# Patient Record
Sex: Male | Born: 1960 | Race: White | Hispanic: No | Marital: Married | Smoking: Former smoker
Health system: Southern US, Community
[De-identification: ages and names within clinical notes are randomized; demographics above are authoritative.]

## PROBLEM LIST (undated history)

## (undated) DIAGNOSIS — R011 Cardiac murmur, unspecified: Secondary | ICD-10-CM

## (undated) DIAGNOSIS — Z87440 Personal history of urinary (tract) infections: Secondary | ICD-10-CM

## (undated) DIAGNOSIS — I6529 Occlusion and stenosis of unspecified carotid artery: Secondary | ICD-10-CM

## (undated) DIAGNOSIS — K219 Gastro-esophageal reflux disease without esophagitis: Secondary | ICD-10-CM

## (undated) DIAGNOSIS — F10931 Alcohol use, unspecified with withdrawal delirium: Secondary | ICD-10-CM

## (undated) DIAGNOSIS — Z9981 Dependence on supplemental oxygen: Secondary | ICD-10-CM

## (undated) DIAGNOSIS — R4020 Unspecified coma: Secondary | ICD-10-CM

## (undated) DIAGNOSIS — E785 Hyperlipidemia, unspecified: Secondary | ICD-10-CM

## (undated) DIAGNOSIS — C679 Malignant neoplasm of bladder, unspecified: Secondary | ICD-10-CM

## (undated) DIAGNOSIS — I10 Essential (primary) hypertension: Secondary | ICD-10-CM

## (undated) DIAGNOSIS — F32A Depression, unspecified: Secondary | ICD-10-CM

## (undated) DIAGNOSIS — X58XXXA Exposure to other specified factors, initial encounter: Secondary | ICD-10-CM

## (undated) DIAGNOSIS — F1010 Alcohol abuse, uncomplicated: Secondary | ICD-10-CM

## (undated) DIAGNOSIS — Z973 Presence of spectacles and contact lenses: Secondary | ICD-10-CM

## (undated) DIAGNOSIS — J449 Chronic obstructive pulmonary disease, unspecified: Secondary | ICD-10-CM

## (undated) DIAGNOSIS — K7460 Unspecified cirrhosis of liver: Secondary | ICD-10-CM

## (undated) DIAGNOSIS — R0602 Shortness of breath: Secondary | ICD-10-CM

## (undated) DIAGNOSIS — F10231 Alcohol dependence with withdrawal delirium: Secondary | ICD-10-CM

## (undated) DIAGNOSIS — C801 Malignant (primary) neoplasm, unspecified: Secondary | ICD-10-CM

## (undated) DIAGNOSIS — IMO0002 Reserved for concepts with insufficient information to code with codable children: Secondary | ICD-10-CM

## (undated) DIAGNOSIS — R1031 Right lower quadrant pain: Secondary | ICD-10-CM

## (undated) DIAGNOSIS — G629 Polyneuropathy, unspecified: Secondary | ICD-10-CM

## (undated) DIAGNOSIS — K449 Diaphragmatic hernia without obstruction or gangrene: Secondary | ICD-10-CM

## (undated) DIAGNOSIS — M25519 Pain in unspecified shoulder: Secondary | ICD-10-CM

## (undated) DIAGNOSIS — G4730 Sleep apnea, unspecified: Secondary | ICD-10-CM

## (undated) DIAGNOSIS — K4090 Unilateral inguinal hernia, without obstruction or gangrene, not specified as recurrent: Secondary | ICD-10-CM

## (undated) DIAGNOSIS — M25539 Pain in unspecified wrist: Secondary | ICD-10-CM

## (undated) DIAGNOSIS — K5730 Diverticulosis of large intestine without perforation or abscess without bleeding: Secondary | ICD-10-CM

## (undated) DIAGNOSIS — G4733 Obstructive sleep apnea (adult) (pediatric): Secondary | ICD-10-CM

## (undated) DIAGNOSIS — G43909 Migraine, unspecified, not intractable, without status migrainosus: Secondary | ICD-10-CM

## (undated) DIAGNOSIS — H269 Unspecified cataract: Secondary | ICD-10-CM

## (undated) DIAGNOSIS — J302 Other seasonal allergic rhinitis: Secondary | ICD-10-CM

## (undated) DIAGNOSIS — J9801 Acute bronchospasm: Secondary | ICD-10-CM

## (undated) DIAGNOSIS — K644 Residual hemorrhoidal skin tags: Secondary | ICD-10-CM

## (undated) DIAGNOSIS — R198 Other specified symptoms and signs involving the digestive system and abdomen: Secondary | ICD-10-CM

## (undated) DIAGNOSIS — F419 Anxiety disorder, unspecified: Secondary | ICD-10-CM

## (undated) DIAGNOSIS — T7840XA Allergy, unspecified, initial encounter: Secondary | ICD-10-CM

## (undated) DIAGNOSIS — B019 Varicella without complication: Secondary | ICD-10-CM

## (undated) DIAGNOSIS — M549 Dorsalgia, unspecified: Secondary | ICD-10-CM

## (undated) HISTORY — PX: CAROTID STENT: SHX1301

## (undated) HISTORY — PX: COLONOSCOPY: WVUENDOPRO10

## (undated) HISTORY — DX: Occlusion and stenosis of unspecified carotid artery: I65.29

## (undated) HISTORY — PX: ESOPHAGOGASTRODUODENOSCOPY: SHX1529

## (undated) HISTORY — PX: OTHER SURGICAL HISTORY: SHX170

## (undated) HISTORY — DX: Malignant neoplasm of bladder, unspecified: C67.9

## (undated) HISTORY — DX: Depression, unspecified: F32.A

## (undated) HISTORY — PX: HX HEART CATHETERIZATION: SHX148

## (undated) HISTORY — PX: HX TONSILLECTOMY: SHX27

## (undated) HISTORY — DX: Obstructive sleep apnea (adult) (pediatric): G47.33

## (undated) HISTORY — PX: UPPER GASTROINTESTINAL ENDOSCOPY: SHX188

## (undated) HISTORY — DX: Unspecified cataract: H26.9

## (undated) HISTORY — PX: WISDOM TOOTH EXTRACTION: SHX21

## (undated) HISTORY — DX: Anxiety disorder, unspecified: F41.9

## (undated) HISTORY — PX: COLONOSCOPY: SHX174

## (undated) HISTORY — DX: Diaphragmatic hernia without obstruction or gangrene: K44.9

## (undated) HISTORY — DX: Pain in unspecified wrist: M25.539

## (undated) HISTORY — DX: Diverticulosis of large intestine without perforation or abscess without bleeding: K57.30

## (undated) HISTORY — DX: Hyperlipidemia, unspecified: E78.5

## (undated) HISTORY — DX: Allergy, unspecified, initial encounter: T78.40XA

## (undated) HISTORY — DX: Right lower quadrant pain: R10.31

## (undated) HISTORY — DX: Sleep apnea, unspecified: G47.30

## (undated) HISTORY — DX: Migraine, unspecified, not intractable, without status migrainosus: G43.909

## (undated) HISTORY — DX: Varicella without complication: B01.9

## (undated) HISTORY — DX: Other specified symptoms and signs involving the digestive system and abdomen: R19.8

## (undated) HISTORY — DX: Residual hemorrhoidal skin tags: K64.4

## (undated) HISTORY — DX: Other seasonal allergic rhinitis: J30.2

## (undated) HISTORY — DX: Pain in unspecified shoulder: M25.519

## (undated) HISTORY — DX: Reserved for concepts with insufficient information to code with codable children: IMO0002

## (undated) HISTORY — DX: Acute bronchospasm: J98.01

## (undated) HISTORY — DX: Dorsalgia, unspecified: M54.9

## (undated) HISTORY — DX: Unilateral inguinal hernia, without obstruction or gangrene, not specified as recurrent: K40.90

## (undated) HISTORY — DX: Polyneuropathy, unspecified: G62.9

## (undated) HISTORY — DX: Gastro-esophageal reflux disease without esophagitis: K21.9

## (undated) MED FILL — lidocaine 2 % mucosal jelly in applicator: Qty: 6 | Status: AC

---

## 2000-10-05 ENCOUNTER — Other Ambulatory Visit: Admission: RE | Admit: 2000-10-05 | Discharge: 2000-10-05 | Payer: Self-pay | Admitting: Gastroenterology

## 2005-09-14 HISTORY — PX: COLON RESECTION: SHX5231

## 2008-02-18 ENCOUNTER — Ambulatory Visit (HOSPITAL_COMMUNITY): Payer: Self-pay | Admitting: FAMILY PRACTICE

## 2011-02-20 ENCOUNTER — Encounter (INDEPENDENT_AMBULATORY_CARE_PROVIDER_SITE_OTHER): Payer: Self-pay | Admitting: *Deleted

## 2011-02-25 NOTE — Letter (Signed)
Summary: New Patient letter  [facility]  [address] [place], [place] [address]   Phone: [phone]  Fax: [phone]       02/20/2011 MRN: [id number]  GEROGE GILLIAM [address] [place], [place]  [address]  Dear Mr. Sabree,  Welcome to the Gastroenterology Division at [facility].    You are scheduled to see Dr.  Jarold Motto on 03-25-11 at 9:00A.M. on the 3rd floor at [facility], [address]  We ask that you try to arrive at our office 15 minutes prior to your appointment time to allow for check-in.  We would like you to complete the enclosed self-administered evaluation form prior to your visit and bring it with you on the day of your appointment.  We will review it with you.  Also, please bring a complete list of all your medications or, if you prefer, bring the medication bottles and we will list them.  Please bring your insurance card so that we may make a copy of it.  If your insurance requires a referral to see a specialist, please bring your referral form from your primary care physician.  Co-payments are due at the time of your visit and may be paid by cash, check or credit card.     Your office visit will consist of a consult with your physician (includes a physical exam), any laboratory testing he/she may order, scheduling of any necessary diagnostic testing (e.g. x-ray, ultrasound, CT-scan), and scheduling of a procedure (e.g. Endoscopy, Colonoscopy) if required.  Please allow enough time on your schedule to allow for any/all of these possibilities.    If you cannot keep your appointment, please call [phone] to cancel or reschedule prior to your appointment date.  This allows [place] the opportunity to schedule an appointment for another patient in need of care.  If you do not cancel or reschedule by 5 p.m. the business day prior to your appointment date, you will be charged a $50.00 late cancellation/no-show fee.    Thank you for choosing  [facility] for your medical needs.  We appreciate the opportunity to care for you.  Please visit [place] at our website  to learn more about our practice.                     Sincerely,                                                             The Gastroenterology Division

## 2011-03-25 ENCOUNTER — Encounter: Payer: Self-pay | Admitting: Gastroenterology

## 2011-03-25 ENCOUNTER — Ambulatory Visit (INDEPENDENT_AMBULATORY_CARE_PROVIDER_SITE_OTHER): Payer: BLUE CROSS/BLUE SHIELD | Admitting: Gastroenterology

## 2011-03-25 DIAGNOSIS — R198 Other specified symptoms and signs involving the digestive system and abdomen: Secondary | ICD-10-CM

## 2011-03-25 DIAGNOSIS — K219 Gastro-esophageal reflux disease without esophagitis: Secondary | ICD-10-CM

## 2011-03-25 DIAGNOSIS — K599 Functional intestinal disorder, unspecified: Secondary | ICD-10-CM

## 2011-03-25 DIAGNOSIS — K594 Anal spasm: Secondary | ICD-10-CM

## 2011-03-25 MED ORDER — HYOSCYAMINE SULFATE 0.125 MG SL SUBL: 0.1250 mg | SUBLINGUAL_TABLET | SUBLINGUAL | Status: AC | PRN

## 2011-03-25 MED ORDER — PEG-KCL-NACL-NASULF-NA ASC-C 100 G PO SOLR: 1.0000 | Freq: Once | ORAL | Status: AC

## 2011-03-25 MED ORDER — MESALAMINE 1000 MG RE SUPP: 1000.0000 mg | Freq: Every day | RECTAL | Status: AC

## 2011-03-25 NOTE — Progress Notes (Signed)
Addended by: Harlow Mares on: 03/25/2011 09:58 AM   Modules accepted: Orders

## 2011-03-25 NOTE — Progress Notes (Signed)
History of Present Illness:  This is a 50 year old Caucasian male referred by [facility] evaluation of worsening acid reflux despite taking Nexium 40 mg a day. There is intermittent solid food dysphagia. Last endoscopic exam was in 2005. He also has had a past history of recurrent diverticulitis and had a partial sigmoid resection 5-6 years ago at [facility]. He been doing well half of her daughter until the last several months but he has noticed a change in caliber of his stool and also rather marked tenesmus. He denies rectal bleeding, melena, abdominal pain, but does have a lot of abdominal gas and bloating. He denies any hepatobiliary complaints or history of pancreatitis or hepatitis. Review of his labs from his primary care doctor showed normal CBC and metabolic profile. His appetite is good his weight is stable and he denies a specific food intolerances except her fried fatty foods. He did have a flare of hemorrhoids one year ago that responded to hemorrhoidal creams. Cannot find a operative report from [facility].  I have reviewed this patient's present history, medical and surgical past history, allergies and medications.     ROS: The remainder of the 10 point ROS is negative. He denies systemic complaints such as fever, chills, skin rashes, or joint pains. 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 He specifically denies cardiovascular, pulmonary, genitourinary, neurologic, orthopedic or endocrine problems. He also denies any chronic neurologic or psychiatric difficulties. He does have allergic sinusitis.  Past Medical History  Diagnosis Date  . Diverticulosis of colon (without mention of hemorrhage)   . Esophageal reflux   . Hiatal hernia   . Other symptoms involving digestive system   . Pain in joint, shoulder region   . Abdominal pain, right lower quadrant   . Backache, unspecified   . Acute bronchospasm   . External hemorrhoids without  mention of complication     thrombosed  . Inguinal hernia without mention of obstruction or gangrene, unilateral or unspecified, (not specified as recurrent)   . Pain in joint, forearm   . Thoracic or lumbosacral neuritis or radiculitis, unspecified   . Obstructive sleep apnea (adult) (pediatric)    Past Surgical History  Procedure Date  . Colon resection 10/06    sigmoid colon    reports that he has quit smoking. His smoking use included Cigars. He has never used smokeless tobacco. He reports that he drinks about 3.5 - 7 ounces of alcohol per week. He reports that he does not use illicit drugs. family history includes Diabetes in his maternal aunt and Lung cancer in his mother.  There is no history of Colon cancer. No Known Allergies      Physical Exam: General well developed well nourished patient in no acute distress, appearing their stated age Eyes PERRLA, no icterus, fundoscopic exam per opthamologist Skin no lesions noted Neck supple, no adenopathy, no thyroid enlargement, no tenderness Chest clear to percussion and auscultation Heart no significant murmurs, gallops or rubs noted Abdomen no hepatosplenomegaly masses or tenderness, BS normal.  Rectal inspection normal no fissures, or fistulae noted.  No masses or tenderness on digital exam. Stool guaiac negative. Extremities no acute joint lesions, edema, phlebitis or evidence of cellulitis. Neurologic patient oriented x 3, cranial nerves intact, no focal neurologic deficits noted. Psychological mental status normal and normal affect.  Assessment and plan: This patient status post sigmoid resection and may have an element of bacterial overgrowth syndrome. Symptomatology seems more consistent with proctitis  is associated tenesmus and proctalgia fugax. I have scheduled colonoscopy exam, also 1 g suppositories at bedtime with when necessary sublingual Levsin use. He is to continue his Nexium with interdental reflux maneuvers, and we  will schedule endoscopic exam and possible dilatation. At the time of his endoscopy we'll do exams for H. pylori and also small bowel biopsy.Previous esophageal biopsies did not show evidence of Barrett's mucosa.  No diagnosis found.

## 2011-03-25 NOTE — Progress Notes (Signed)
Addended by: Harlow Mares on: 03/25/2011 09:57 AM   Modules accepted: Orders

## 2011-03-25 NOTE — Patient Instructions (Addendum)
We printed your rxs and gave them to you in the office today,  Your procedure has been scheduled for 04/04/2011, please follow the seperate instructions.

## 2011-03-26 ENCOUNTER — Encounter: Payer: Self-pay | Admitting: Gastroenterology

## 2011-04-03 ENCOUNTER — Encounter: Payer: Self-pay | Admitting: Internal Medicine

## 2011-04-04 ENCOUNTER — Encounter: Payer: Self-pay | Admitting: Gastroenterology

## 2011-04-04 ENCOUNTER — Ambulatory Visit (AMBULATORY_SURGERY_CENTER): Payer: BLUE CROSS/BLUE SHIELD | Admitting: Gastroenterology

## 2011-04-04 VITALS — BP 122/79 | HR 81 | Temp 99.0°F | Resp 19 | Ht 65.0 in | Wt 154.0 lb

## 2011-04-04 DIAGNOSIS — Z1211 Encounter for screening for malignant neoplasm of colon: Secondary | ICD-10-CM

## 2011-04-04 DIAGNOSIS — K219 Gastro-esophageal reflux disease without esophagitis: Secondary | ICD-10-CM

## 2011-04-04 DIAGNOSIS — K5730 Diverticulosis of large intestine without perforation or abscess without bleeding: Secondary | ICD-10-CM | POA: Insufficient documentation

## 2011-04-04 MED ORDER — SODIUM CHLORIDE 0.9 % IV SOLN: 500.0000 mL | INTRAVENOUS | Status: DC

## 2011-04-04 NOTE — Patient Instructions (Signed)
Discharged instructions given with verbal understanding. Handout on Diverticulosis given. Resume previous medications. 

## 2011-04-07 ENCOUNTER — Telehealth: Payer: Self-pay

## 2011-04-07 NOTE — Telephone Encounter (Signed)

## 2012-12-15 HISTORY — PX: INGUINAL HERNIA REPAIR: SUR1180

## 2014-04-02 ENCOUNTER — Encounter (HOSPITAL_BASED_OUTPATIENT_CLINIC_OR_DEPARTMENT_OTHER): Payer: Self-pay | Admitting: Emergency Medicine

## 2014-04-02 ENCOUNTER — Emergency Department (HOSPITAL_BASED_OUTPATIENT_CLINIC_OR_DEPARTMENT_OTHER)
Admission: EM | Admit: 2014-04-02 | Discharge: 2014-04-02 | Disposition: A | Discharge status: Home / Self Care | Payer: BC Managed Care – PPO | Attending: Emergency Medicine | Admitting: Emergency Medicine

## 2014-04-02 DIAGNOSIS — T50905A Adverse effect of unspecified drugs, medicaments and biological substances, initial encounter: Secondary | ICD-10-CM

## 2014-04-02 DIAGNOSIS — R110 Nausea: Secondary | ICD-10-CM | POA: Insufficient documentation

## 2014-04-02 DIAGNOSIS — K219 Gastro-esophageal reflux disease without esophagitis: Secondary | ICD-10-CM | POA: Insufficient documentation

## 2014-04-02 DIAGNOSIS — Z79899 Other long term (current) drug therapy: Secondary | ICD-10-CM | POA: Insufficient documentation

## 2014-04-02 DIAGNOSIS — Z87891 Personal history of nicotine dependence: Secondary | ICD-10-CM | POA: Insufficient documentation

## 2014-04-02 DIAGNOSIS — Z8709 Personal history of other diseases of the respiratory system: Secondary | ICD-10-CM | POA: Insufficient documentation

## 2014-04-02 DIAGNOSIS — R0602 Shortness of breath: Secondary | ICD-10-CM | POA: Insufficient documentation

## 2014-04-02 DIAGNOSIS — Z8739 Personal history of other diseases of the musculoskeletal system and connective tissue: Secondary | ICD-10-CM | POA: Insufficient documentation

## 2014-04-02 DIAGNOSIS — R209 Unspecified disturbances of skin sensation: Secondary | ICD-10-CM | POA: Insufficient documentation

## 2014-04-02 DIAGNOSIS — Z8669 Personal history of other diseases of the nervous system and sense organs: Secondary | ICD-10-CM | POA: Insufficient documentation

## 2014-04-02 DIAGNOSIS — F411 Generalized anxiety disorder: Secondary | ICD-10-CM | POA: Insufficient documentation

## 2014-04-02 DIAGNOSIS — T43205A Adverse effect of unspecified antidepressants, initial encounter: Secondary | ICD-10-CM | POA: Insufficient documentation

## 2014-04-02 MED ORDER — LORAZEPAM 1 MG PO TABS
1.0000 mg | ORAL_TABLET | Freq: Once | ORAL | Status: AC
  Administered 2014-04-02: 1 mg via ORAL
  Filled 2014-04-02: qty 1

## 2014-04-02 NOTE — Discharge Instructions (Signed)
Please do not take lexapro today.  Consult with your neurologist tomorrow.  Recheck of blood pressure during next 1-2 weeks.

## 2014-04-02 NOTE — ED Notes (Signed)
Patient felt tingling all over his body, he felt anxious as well. Went away and then happened again about 15 minutes later. Denies any symptoms at this time.

## 2014-04-02 NOTE — ED Notes (Signed)
Pt states he was place on Lexapro for ringing of ears, vertigo, and dizziness.  Pt has taken two doses.  Pt states he has felt weird while taking it.  Today he was at the computer and started to feel anxious, tingling all over and hot.   Pt states the feelings are intermittent and doesn't have it at present.

## 2014-04-02 NOTE — ED Provider Notes (Signed)
CSN: [id number]     Arrival date & time 04/02/14  1520 History  This chart was scribed for Shaune Pollack, MD by Zettie Pho, ED Scribe. This patient was seen in room MH09/MH09 and the patient's care was started at 4:23 PM.    Chief Complaint  Patient presents with  . Anxiety   Patient is a 53 y.o. male presenting with anxiety. The history is provided by the patient. No language interpreter was used.  Anxiety This is a new problem. The current episode started 3 to 5 hours ago. The problem occurs constantly. The problem has been resolved. Associated symptoms include shortness of breath. Nothing aggravates the symptoms. Nothing relieves the symptoms. He has tried nothing for the symptoms.   HPI Comments: Curtis Luna is a 53 y.o. male who presents to the Emergency Department complaining of anxiety-like symptoms including paresthesias diffusely over his body, nausea, and exertional shortness of breath onset a few hours ago that he states has since resolved. Patient reports that he recently started taking Lexapro to treat some depressive symptoms. He states that he has only taken this medication twice, once last night and once 2 nights ago. Patient states that it has not been effective at alleviating his depressive symptoms. He denies visual disturbance. Patient has no other pertinent medical history.   Past Medical History  Diagnosis Date  . Diverticulosis of colon (without mention of hemorrhage)   . Esophageal reflux   . Other symptoms involving digestive system(787.99)   . Pain in joint, shoulder region   . Abdominal pain, right lower quadrant   . Backache, unspecified   . Acute bronchospasm   . External hemorrhoids without mention of complication     thrombosed  . Inguinal hernia without mention of obstruction or gangrene, unilateral or unspecified, (not specified as recurrent)   . Pain in joint, forearm   . Thoracic or lumbosacral neuritis or radiculitis, unspecified   . Obstructive  sleep apnea (adult) (pediatric)   . Hiatal hernia    Past Surgical History  Procedure Laterality Date  . Colon resection  10/06    sigmoid colon   Family History  Problem Relation Age of Onset  . Diabetes Maternal Aunt   . Lung cancer Mother   . Colon cancer Neg Hx    History  Substance Use Topics  . Smoking status: Former Smoker    Types: Cigarettes, Cigars    Quit date: 10/04/1987  . Smokeless tobacco: Never Used  . Alcohol Use: 3.5 - 7.0 oz/week    7-14 drink(s) per week     Comment: 2 daily     Review of Systems  Eyes: Negative for visual disturbance.  Respiratory: Positive for shortness of breath.   Gastrointestinal: Positive for nausea.  Neurological:       Paresthesias  All other systems reviewed and are negative.  Allergies  Review of patient's allergies indicates no known allergies.  Home Medications   Prior to Admission medications   Medication Sig Start Date End Date Taking? Authorizing Provider  escitalopram (LEXAPRO) 10 MG tablet Take 10 mg by mouth daily.   Yes Historical Provider, MD  esomeprazole (NEXIUM) 40 MG capsule Take 40 mg by mouth daily before breakfast.     Yes Historical Provider, MD  Omega-3 Fatty Acids (FISH OIL) 1000 MG CAPS Take 1 capsule by mouth daily.     Yes Historical Provider, MD   BP 156/88  Pulse 79  Temp(Src) 98.4 F (36.9 C)  Resp 20  Ht 5\' 5"  (1.651 m)  Wt 150 lb (68.04 kg)  BMI 24.96 kg/m2  Physical Exam  Nursing note and vitals reviewed. Constitutional: He is oriented to person, place, and time. He appears well-developed and well-nourished. No distress.  HENT:  Head: Normocephalic and atraumatic.  Eyes: Conjunctivae and EOM are normal. Pupils are equal, round, and reactive to light.  Neck: Normal range of motion. Neck supple.  Cardiovascular: Normal rate, regular rhythm and normal heart sounds.   Pulmonary/Chest: Effort normal and breath sounds normal. No respiratory distress.  Abdominal: Soft. He exhibits no  distension.  Musculoskeletal: Normal range of motion.  Neurological: He is alert and oriented to person, place, and time.  Normal gait without ataxia or unbalance.   Skin: Skin is warm and dry.  Psychiatric: He has a normal mood and affect. His behavior is normal.    ED Course  Procedures (including critical care time)  DIAGNOSTIC STUDIES: OxCOORDINATION OF CARE: 4:27 PM- Will order Ativan to manage symptoms. Advised patient to stop taking the Lexapro. Will provide patient with resources to help him find a PCP. Discussed treatment plan with patient at bedside and patient verbalized agreement.     Labs Review Labs Reviewed - No data to display  Imaging Review No results found.   EKG Interpretation None      MDM   Final diagnoses:  Medication reaction    I personally performed the services described in this documentation, which was scribed in my presence. The recorded information has been reviewed and considered.      Shaune Pollack, MD 04/02/14 [phone]

## 2014-04-05 ENCOUNTER — Encounter: Payer: Self-pay | Admitting: Gastroenterology

## 2014-04-14 ENCOUNTER — Encounter: Payer: Self-pay | Admitting: Physician Assistant

## 2014-04-14 ENCOUNTER — Ambulatory Visit (INDEPENDENT_AMBULATORY_CARE_PROVIDER_SITE_OTHER): Payer: BC Managed Care – PPO | Admitting: Physician Assistant

## 2014-04-14 VITALS — BP 118/74 | HR 82 | Temp 98.1°F | Resp 16 | Ht 65.0 in | Wt 149.8 lb

## 2014-04-14 DIAGNOSIS — F419 Anxiety disorder, unspecified: Secondary | ICD-10-CM

## 2014-04-14 DIAGNOSIS — K648 Other hemorrhoids: Secondary | ICD-10-CM

## 2014-04-14 DIAGNOSIS — G8929 Other chronic pain: Secondary | ICD-10-CM

## 2014-04-14 DIAGNOSIS — M545 Low back pain, unspecified: Secondary | ICD-10-CM

## 2014-04-14 DIAGNOSIS — F411 Generalized anxiety disorder: Secondary | ICD-10-CM

## 2014-04-14 DIAGNOSIS — K219 Gastro-esophageal reflux disease without esophagitis: Secondary | ICD-10-CM

## 2014-04-14 MED ORDER — HYDROCORTISONE ACETATE 25 MG RE SUPP: 25.0000 mg | Freq: Two times a day (BID) | RECTAL | Status: DC

## 2014-04-14 NOTE — Progress Notes (Signed)
Patient presents to clinic today to establish care.  Acute Concerns: Sensation of a hemorrhoid in the rectum -- patient endorses sensation of rectal mass. Patient denies rectal bleeding. He endorses occasional pain with bowel movement. Denies melena. Has history of hemorrhoids.  Stress/Anxiety -- patient currently on 1 mg of Ativan for occasional anxiety symptoms. Doing well. Was recently started on Lexapro but had a significant medication reaction. Patient's anxiety medications are prescribed by his neurologist. Patient is following up with neurologist concerning this.  Chronic Issues: GERD -- well controlled with Nexium.   Hx of Diverticulitis -- s/p colon resection a few years ago.  Doing well without recurrence of symptoms.  Low Back Pain -- occasional symptoms, related with activity level.  Has Rx Celebrex but uses very rarely.  Health Maintenance: Dental -- UTD Vision -- UTD Immunizations -- Unsure of last Tetanus.  Declines tetanus at present time. Colonoscopy -- 2012; no abnormal findings; due in 2022  Past Medical History  Diagnosis Date  . Diverticulosis of colon (without mention of hemorrhage)   . Esophageal reflux   . Other symptoms involving digestive system(787.99)   . Pain in joint, shoulder region   . Abdominal pain, right lower quadrant   . Backache, unspecified   . Acute bronchospasm   . External hemorrhoids without mention of complication     thrombosed  . Inguinal hernia without mention of obstruction or gangrene, unilateral or unspecified, (not specified as recurrent)   . Pain in joint, forearm   . Thoracic or lumbosacral neuritis or radiculitis, unspecified   . Obstructive sleep apnea (adult) (pediatric)   . Hiatal hernia   . Migraine   . Chicken pox   . Seasonal allergies     Past Surgical History  Procedure Laterality Date  . Colon resection  10/06    sigmoid colon  . Wisdom tooth extraction    . Inguinal hernia repair  12/2012    Left     Current Outpatient Prescriptions on File Prior to Visit  Medication Sig Dispense Refill  . esomeprazole (NEXIUM) 40 MG capsule Take 40 mg by mouth daily before breakfast.         No current facility-administered medications on file prior to visit.    No Known Allergies  Family History  Problem Relation Age of Onset  . Diabetes Maternal Aunt   . Lung cancer Mother 21    Deceased  . Colon cancer Neg Hx   . Other Father     MVA  . Heart disease Paternal Grandmother   . Heart attack Paternal Grandmother   . Healthy Brother     x2  . Aneurysm Sister     Brain  . Allergies Daughter     x2  . Migraines Daughter     x1    History   Social History  . Marital Status: Married    Spouse Name: N/A    Number of Children: N/A  . Years of Education: N/A   Occupational History  . Technical sales engineer     Social History Main Topics  . Smoking status: Former Smoker    Types: Cigarettes, Cigars    Quit date: 10/04/1987  . Smokeless tobacco: Never Used  . Alcohol Use: 3.5 - 7.0 oz/week    7-14 drink(s) per week     Comment: 2 daily   . Drug Use: No  . Sexual Activity: Not on file   Other Topics Concern  . Not on file   Social History  Narrative   1 caffeine drink daily    Review of Systems  Constitutional: Negative for fever and weight loss.  HENT: Positive for tinnitus. Negative for ear discharge, ear pain and hearing loss.        Is seeing Neurology.  Eyes: Negative for blurred vision, double vision, photophobia and pain.  Respiratory: Negative for cough and shortness of breath.   Cardiovascular: Negative for chest pain and palpitations.  Gastrointestinal: Positive for heartburn. Negative for nausea, vomiting, abdominal pain, diarrhea, constipation, blood in stool and melena.  Genitourinary: Negative for dysuria, urgency, frequency, hematuria and flank pain.  Neurological: Positive for dizziness and headaches. Negative for loss of consciousness.   Psychiatric/Behavioral: Negative for depression, suicidal ideas, hallucinations and substance abuse. The patient is nervous/anxious. The patient does not have insomnia.    BP 118/74  Pulse 82  Temp(Src) 98.1 F (36.7 C) (Oral)  Resp 16  Ht 5\' 5"  (1.651 m)  Wt 149 lb 12 oz (67.926 kg)  BMI 24.92 kg/m2  SpO2 98%  Physical Exam  Vitals reviewed. Constitutional: He is oriented to person, place, and time and well-developed, well-nourished, and in no distress.  HENT:  Head: Normocephalic and atraumatic.  Right Ear: External ear normal.  Left Ear: External ear normal.  Nose: Nose normal.  Mouth/Throat: Oropharynx is clear and moist. No oropharyngeal exudate.  Tympanic membranes within normal limits bilaterally.  Eyes: Conjunctivae are normal. Pupils are equal, round, and reactive to light.  Neck: Neck supple.  Cardiovascular: Normal rate, regular rhythm, normal heart sounds and intact distal pulses.   Pulmonary/Chest: Effort normal and breath sounds normal.  Abdominal: Soft. Bowel sounds are normal. He exhibits no distension and no mass. There is no tenderness.  Genitourinary: Prostate normal. Rectal exam shows internal hemorrhoid. Rectal exam shows no fissure, no mass and no tenderness. Guaiac negative stool.  Neurological: He is alert and oriented to person, place, and time.  Skin: Skin is warm and dry. No rash noted.  Psychiatric: Affect normal.    Assessment/Plan: Internal hemorrhoid Rx Anusol-HC. Increase fluid intake. Sitz bath. Fiber supplement daily. Call or return to clinic if symptoms not improving.  Esophageal reflux Well-controlled on current regimen. Continue Nexium  Chronic low back pain Infrequent flareups. No symptoms at present. Has Celebrex that he takes during flareups.  Anxiety Followed by neurology. Currently treated with Ativan.

## 2014-04-14 NOTE — Progress Notes (Signed)
Pre visit review using our clinic review tool, if applicable. No additional management support is needed unless otherwise documented below in the visit note/SLS  

## 2014-04-14 NOTE — Patient Instructions (Signed)
Please take suppositories as directed.  Increase fluid intake.  Continue fiber supplement.  Please call if symptoms are not improving.  Please return to clinic for annual exam.  Return sooner if needed.   Preventive Care for Adults, Male A healthy lifestyle and preventive care can promote health and wellness. Preventive health guidelines for men include the following key practices:  A routine yearly physical is a good way to check with your health care provider about your health and preventative screening. It is a chance to share any concerns and updates on your health and to receive a thorough exam.  Visit your dentist for a routine exam and preventative care every 6 months. Brush your teeth twice a day and floss once a day. Good oral hygiene prevents tooth decay and gum disease.  The frequency of eye exams is based on your age, health, family medical history, use of contact lenses, and other factors. Follow your health care provider's recommendations for frequency of eye exams.  Eat a healthy diet. Foods such as vegetables, fruits, whole grains, low-fat dairy products, and lean protein foods contain the nutrients you need without too many calories. Decrease your intake of foods high in solid fats, added sugars, and salt. Eat the right amount of calories for you.Get information about a proper diet from your health care provider, if necessary.  Regular physical exercise is one of the most important things you can do for your health. Most adults should get at least 150 minutes of moderate-intensity exercise (any activity that increases your heart rate and causes you to sweat) each week. In addition, most adults need muscle-strengthening exercises on 2 or more days a week.  Maintain a healthy weight. The body mass index (BMI) is a screening tool to identify possible weight problems. It provides an estimate of body fat based on height and weight. Your health care provider can find your BMI and can  help you achieve or maintain a healthy weight.For adults 20 years and older:  A BMI below 18.5 is considered underweight.  A BMI of 18.5 to 24.9 is normal.  A BMI of 25 to 29.9 is considered overweight.  A BMI of 30 and above is considered obese.  Maintain normal blood lipids and cholesterol levels by exercising and minimizing your intake of saturated fat. Eat a balanced diet with plenty of fruit and vegetables. Blood tests for lipids and cholesterol should begin at age 71 and be repeated every 5 years. If your lipid or cholesterol levels are high, you are over 50, or you are at high risk for heart disease, you may need your cholesterol levels checked more frequently.Ongoing high lipid and cholesterol levels should be treated with medicines if diet and exercise are not working.  If you smoke, find out from your health care provider how to quit. If you do not use tobacco, do not start.  Lung cancer screening is recommended for adults aged 61 80 years who are at high risk for developing lung cancer because of a history of smoking. A yearly low-dose CT scan of the lungs is recommended for people who have at least a 30-pack-year history of smoking and are a current smoker or have quit within the past 15 years. A pack year of smoking is smoking an average of 1 pack of cigarettes a day for 1 year (for example: 1 pack a day for 30 years or 2 packs a day for 15 years). Yearly screening should continue until the smoker has stopped smoking  for at least 15 years. Yearly screening should be stopped for people who develop a health problem that would prevent them from having lung cancer treatment.  If you choose to drink alcohol, do not have more than 2 drinks per day. One drink is considered to be 12 ounces (355 mL) of beer, 5 ounces (148 mL) of wine, or 1.5 ounces (44 mL) of liquor.  Avoid use of street drugs. Do not share needles with anyone. Ask for help if you need support or instructions about stopping  the use of drugs.  High blood pressure causes heart disease and increases the risk of stroke. Your blood pressure should be checked at least every 1 2 years. Ongoing high blood pressure should be treated with medicines, if weight loss and exercise are not effective.  If you are 55 54 years old, ask your health care provider if you should take aspirin to prevent heart disease.  Diabetes screening involves taking a blood sample to check your fasting blood sugar level. This should be done once every 3 years, after age 34, if you are within normal weight and without risk factors for diabetes. Testing should be considered at a younger age or be carried out more frequently if you are overweight and have at least 1 risk factor for diabetes.  Colorectal cancer can be detected and often prevented. Most routine colorectal cancer screening begins at the age of 72 and continues through age 47. However, your health care provider may recommend screening at an earlier age if you have risk factors for colon cancer. On a yearly basis, your health care provider may provide home test kits to check for hidden blood in the stool. Use of a small camera at the end of a tube to directly examine the colon (sigmoidoscopy or colonoscopy) can detect the earliest forms of colorectal cancer. Talk to your health care provider about this at age 29, when routine screening begins. Direct exam of the colon should be repeated every 5 10 years through age 94, unless early forms of precancerous polyps or small growths are found.  People who are at an increased risk for hepatitis B should be screened for this virus. You are considered at high risk for hepatitis B if:  You were born in a country where hepatitis B occurs often. Talk with your health care provider about which countries are considered high-risk.  Your parents were born in a high-risk country and you have not received a shot to protect against hepatitis B (hepatitis B  vaccine).  You have HIV or AIDS.  You use needles to inject street drugs.  You live with, or have sex with, someone who has hepatitis B.  You are a man who has sex with other men (MSM).  You get hemodialysis treatment.  You take certain medicines for conditions such as cancer, organ transplantation, and autoimmune conditions.  Hepatitis C blood testing is recommended for all people born from [birth year] through 1965 and any individual with known risks for hepatitis C.  Practice safe sex. Use condoms and avoid high-risk sexual practices to reduce the spread of sexually transmitted infections (STIs). STIs include gonorrhea, chlamydia, syphilis, trichomonas, herpes, HPV, and human immunodeficiency virus (HIV). Herpes, HIV, and HPV are viral illnesses that have no cure. They can result in disability, cancer, and death.  A one-time screening for abdominal aortic aneurysm (AAA) and surgical repair of large AAAs by ultrasound are recommended for men ages 18 to 16 years who are current or former  smokers.  Healthy men should no longer receive prostate-specific antigen (PSA) blood tests as part of routine cancer screening. Talk with your health care provider about prostate cancer screening.  Testicular cancer screening is not recommended for adult males who have no symptoms. Screening includes self-exam, a health care provider exam, and other screening tests. Consult with your health care provider about any symptoms you have or any concerns you have about testicular cancer.  Use sunscreen. Apply sunscreen liberally and repeatedly throughout the day. You should seek shade when your shadow is shorter than you. Protect yourself by wearing long sleeves, pants, a wide-brimmed hat, and sunglasses year round, whenever you are outdoors.  Once a month, do a whole-body skin exam, using a mirror to look at the skin on your back. Tell your health care provider about new moles, moles that have irregular borders, moles  that are larger than a pencil eraser, or moles that have changed in shape or color.  Stay current with required vaccines (immunizations).  Influenza vaccine. All adults should be immunized every year.  Tetanus, diphtheria, and acellular pertussis (Td, Tdap) vaccine. An adult who has not previously received Tdap or who does not know his vaccine status should receive 1 dose of Tdap. This initial dose should be followed by tetanus and diphtheria toxoids (Td) booster doses every 10 years. Adults with an unknown or incomplete history of completing a 3-dose immunization series with Td-containing vaccines should begin or complete a primary immunization series including a Tdap dose. Adults should receive a Td booster every 10 years.  Varicella vaccine. An adult without evidence of immunity to varicella should receive 2 doses or a second dose if he has previously received 1 dose.  Human papillomavirus (HPV) vaccine. Males aged 47 21 years who have not received the vaccine previously should receive the 3-dose series. Males aged 47 26 years may be immunized. Immunization is recommended through the age of 80 years for any male who has sex with males and did not get any or all doses earlier. Immunization is recommended for any person with an immunocompromised condition through the age of 36 years if he did not get any or all doses earlier. During the 3-dose series, the second dose should be obtained 4 8 weeks after the first dose. The third dose should be obtained 24 weeks after the first dose and 16 weeks after the second dose.  Zoster vaccine. One dose is recommended for adults aged 81 years or older unless certain conditions are present.  Measles, mumps, and rubella (MMR) vaccine. Adults born before [birth year] generally are considered immune to measles and mumps. Adults born in [birth year] or later should have 1 or more doses of MMR vaccine unless there is a contraindication to the vaccine or there is laboratory evidence of  immunity to each of the three diseases. A routine second dose of MMR vaccine should be obtained at least 28 days after the first dose for students attending postsecondary schools, health care workers, or international travelers. People who received inactivated measles vaccine or an unknown type of measles vaccine during 1963 1967 should receive 2 doses of MMR vaccine. People who received inactivated mumps vaccine or an unknown type of mumps vaccine before 1979 and are at high risk for mumps infection should consider immunization with 2 doses of MMR vaccine. Unvaccinated health care workers born before [birth year] who lack laboratory evidence of measles, mumps, or rubella immunity or laboratory confirmation of disease should consider measles and mumps immunization with 2  doses of MMR vaccine or rubella immunization with 1 dose of MMR vaccine.  Pneumococcal 13-valent conjugate (PCV13) vaccine. When indicated, a person who is uncertain of his immunization history and has no record of immunization should receive the PCV13 vaccine. An adult aged 57 years or older who has certain medical conditions and has not been previously immunized should receive 1 dose of PCV13 vaccine. This PCV13 should be followed with a dose of pneumococcal polysaccharide (PPSV23) vaccine. The PPSV23 vaccine dose should be obtained at least 8 weeks after the dose of PCV13 vaccine. An adult aged 67 years or older who has certain medical conditions and previously received 1 or more doses of PPSV23 vaccine should receive 1 dose of PCV13. The PCV13 vaccine dose should be obtained 1 or more years after the last PPSV23 vaccine dose.  Pneumococcal polysaccharide (PPSV23) vaccine. When PCV13 is also indicated, PCV13 should be obtained first. All adults aged 66 years and older should be immunized. An adult younger than age 71 years who has certain medical conditions should be immunized. Any person who resides in a nursing home or long-term care facility  should be immunized. An adult smoker should be immunized. People with an immunocompromised condition and certain other conditions should receive both PCV13 and PPSV23 vaccines. People with human immunodeficiency virus (HIV) infection should be immunized as soon as possible after diagnosis. Immunization during chemotherapy or radiation therapy should be avoided. Routine use of PPSV23 vaccine is not recommended for American Indians, [place] Natives, or people younger than 65 years unless there are medical conditions that require PPSV23 vaccine. When indicated, people who have unknown immunization and have no record of immunization should receive PPSV23 vaccine. One-time revaccination 5 years after the first dose of PPSV23 is recommended for people aged 85 64 years who have chronic kidney failure, nephrotic syndrome, asplenia, or immunocompromised conditions. People who received 1 2 doses of PPSV23 before age 32 years should receive another dose of PPSV23 vaccine at age 77 years or later if at least 5 years have passed since the previous dose. Doses of PPSV23 are not needed for people immunized with PPSV23 at or after age 55 years.  Meningococcal vaccine. Adults with asplenia or persistent complement component deficiencies should receive 2 doses of quadrivalent meningococcal conjugate (MenACWY-D) vaccine. The doses should be obtained at least 2 months apart. Microbiologists working with certain meningococcal bacteria, [employer] recruits, people at risk during an outbreak, and people who travel to or live in countries with a high rate of meningitis should be immunized. A first-year college student up through age 34 years who is living in a residence hall should receive a dose if he did not receive a dose on or after his 16th birthday. Adults who have certain high-risk conditions should receive one or more doses of vaccine.  Hepatitis A vaccine. Adults who wish to be protected from this disease, have certain high-risk  conditions, work with hepatitis A-infected animals, work in hepatitis A research labs, or travel to or work in countries with a high rate of hepatitis A should be immunized. Adults who were previously unvaccinated and who anticipate close contact with an international adoptee during the first 60 days after arrival in the [place] from a country with a high rate of hepatitis A should be immunized.  Hepatitis B vaccine. Adults who wish to be protected from this disease, have certain high-risk conditions, may be exposed to blood or other infectious body fluids, are household contacts or sex partners of hepatitis  B positive people, are clients or workers in certain care facilities, or travel to or work in countries with a high rate of hepatitis B should be immunized.  Haemophilus influenzae type b (Hib) vaccine. A previously unvaccinated person with asplenia or sickle cell disease or having a scheduled splenectomy should receive 1 dose of Hib vaccine. Regardless of previous immunization, a recipient of a hematopoietic stem cell transplant should receive a 3-dose series 6 12 months after his successful transplant. Hib vaccine is not recommended for adults with HIV infection. Preventive Service / Frequency Ages 58 to 67  Blood pressure check.** / Every 1 to 2 years.  Lipid and cholesterol check.** / Every 5 years beginning at age 48.  Hepatitis C blood test.** / For any individual with known risks for hepatitis C.  Skin self-exam. / Monthly.  Influenza vaccine. / Every year.  Tetanus, diphtheria, and acellular pertussis (Tdap, Td) vaccine.** / Consult your health care provider. 1 dose of Td every 10 years.  Varicella vaccine.** / Consult your health care provider.  HPV vaccine. / 3 doses over 6 months, if 46 or younger.  Measles, mumps, rubella (MMR) vaccine.** / You need at least 1 dose of MMR if you were born in 1957 or later. You may also need a second dose.  Pneumococcal 13-valent  conjugate (PCV13) vaccine.** / Consult your health care provider.  Pneumococcal polysaccharide (PPSV23) vaccine.** / 1 to 2 doses if you smoke cigarettes or if you have certain conditions.  Meningococcal vaccine.** / 1 dose if you are age 41 to 77 years and a Market researcher living in a residence hall, or have one of several medical conditions. You may also need additional booster doses.  Hepatitis A vaccine.** / Consult your health care provider.  Hepatitis B vaccine.** / Consult your health care provider.  Haemophilus influenzae type b (Hib) vaccine.** / Consult your health care provider. Ages 23 to 5  Blood pressure check.** / Every 1 to 2 years.  Lipid and cholesterol check.** / Every 5 years beginning at age 46.  Lung cancer screening. / Every year if you are aged 32 80 years and have a 30-pack-year history of smoking and currently smoke or have quit within the past 15 years. Yearly screening is stopped once you have quit smoking for at least 15 years or develop a health problem that would prevent you from having lung cancer treatment.  Fecal occult blood test (FOBT) of stool. / Every year beginning at age 60 and continuing until age 72. You may not have to do this test if you get a colonoscopy every 10 years.  Flexible sigmoidoscopy** or colonoscopy.** / Every 5 years for a flexible sigmoidoscopy or every 10 years for a colonoscopy beginning at age 70 and continuing until age 63.  Hepatitis C blood test.** / For all people born from [birth year] through 1965 and any individual with known risks for hepatitis C.  Skin self-exam. / Monthly.  Influenza vaccine. / Every year.  Tetanus, diphtheria, and acellular pertussis (Tdap/Td) vaccine.** / Consult your health care provider. 1 dose of Td every 10 years.  Varicella vaccine.** / Consult your health care provider.  Zoster vaccine.** / 1 dose for adults aged 86 years or older.  Measles, mumps, rubella (MMR) vaccine.** / You  need at least 1 dose of MMR if you were born in 1957 or later. You may also need a second dose.  Pneumococcal 13-valent conjugate (PCV13) vaccine.** / Consult your health care provider.  Pneumococcal  polysaccharide (PPSV23) vaccine.** / 1 to 2 doses if you smoke cigarettes or if you have certain conditions.  Meningococcal vaccine.** / Consult your health care provider.  Hepatitis A vaccine.** / Consult your health care provider.  Hepatitis B vaccine.** / Consult your health care provider.  Haemophilus influenzae type b (Hib) vaccine.** / Consult your health care provider. Ages 26 and over  Blood pressure check.** / Every 1 to 2 years.  Lipid and cholesterol check.**/ Every 5 years beginning at age 21.  Lung cancer screening. / Every year if you are aged 84 80 years and have a 30-pack-year history of smoking and currently smoke or have quit within the past 15 years. Yearly screening is stopped once you have quit smoking for at least 15 years or develop a health problem that would prevent you from having lung cancer treatment.  Fecal occult blood test (FOBT) of stool. / Every year beginning at age 37 and continuing until age 69. You may not have to do this test if you get a colonoscopy every 10 years.  Flexible sigmoidoscopy** or colonoscopy.** / Every 5 years for a flexible sigmoidoscopy or every 10 years for a colonoscopy beginning at age 67 and continuing until age 39.  Hepatitis C blood test.** / For all people born from [birth year] through 1966 and any individual with known risks for hepatitis C.  Abdominal aortic aneurysm (AAA) screening.** / A one-time screening for ages 36 to 19 years who are current or former smokers.  Skin self-exam. / Monthly.  Influenza vaccine. / Every year.  Tetanus, diphtheria, and acellular pertussis (Tdap/Td) vaccine.** / 1 dose of Td every 10 years.  Varicella vaccine.** / Consult your health care provider.  Zoster vaccine.** / 1 dose for adults aged 55  years or older.  Pneumococcal 13-valent conjugate (PCV13) vaccine.** / Consult your health care provider.  Pneumococcal polysaccharide (PPSV23) vaccine.** / 1 dose for all adults aged 52 years and older.  Meningococcal vaccine.** / Consult your health care provider.  Hepatitis A vaccine.** / Consult your health care provider.  Hepatitis B vaccine.** / Consult your health care provider.  Haemophilus influenzae type b (Hib) vaccine.** / Consult your health care provider. **Family history and personal history of risk and conditions may change your health care provider's recommendations. Document Released: 01/27/2002 Document Revised: 09/21/2013 Document Reviewed: 04/28/2011 [facility] Patient Information 2014 [place], [place].

## 2014-04-20 ENCOUNTER — Telehealth: Payer: Self-pay | Admitting: Physician Assistant

## 2014-04-20 DIAGNOSIS — K648 Other hemorrhoids: Secondary | ICD-10-CM | POA: Insufficient documentation

## 2014-04-20 DIAGNOSIS — F419 Anxiety disorder, unspecified: Secondary | ICD-10-CM | POA: Insufficient documentation

## 2014-04-20 DIAGNOSIS — M545 Low back pain, unspecified: Secondary | ICD-10-CM | POA: Insufficient documentation

## 2014-04-20 DIAGNOSIS — G8929 Other chronic pain: Secondary | ICD-10-CM | POA: Insufficient documentation

## 2014-04-20 NOTE — Assessment & Plan Note (Signed)
Rx Anusol-HC. Increase fluid intake. Sitz bath. Fiber supplement daily. Call or return to clinic if symptoms not improving.

## 2014-04-20 NOTE — Assessment & Plan Note (Signed)
Infrequent flareups. No symptoms at present. Has Celebrex that he takes during flareups.

## 2014-04-20 NOTE — Assessment & Plan Note (Signed)
Followed by neurology. Currently treated with Ativan.

## 2014-04-20 NOTE — Telephone Encounter (Signed)
Received medical records from Dr. Jeralene Huff

## 2014-04-20 NOTE — Assessment & Plan Note (Signed)
Well-controlled on current regimen. Continue Nexium

## 2014-05-23 ENCOUNTER — Ambulatory Visit: Payer: BLUE CROSS/BLUE SHIELD | Admitting: Gastroenterology

## 2014-06-05 ENCOUNTER — Encounter: Payer: Self-pay | Admitting: Gastroenterology

## 2014-06-05 ENCOUNTER — Ambulatory Visit (INDEPENDENT_AMBULATORY_CARE_PROVIDER_SITE_OTHER): Payer: BC Managed Care – PPO | Admitting: Gastroenterology

## 2014-06-05 VITALS — BP 100/68 | HR 80 | Ht 65.0 in | Wt 147.8 lb

## 2014-06-05 DIAGNOSIS — K5730 Diverticulosis of large intestine without perforation or abscess without bleeding: Secondary | ICD-10-CM

## 2014-06-05 DIAGNOSIS — R198 Other specified symptoms and signs involving the digestive system and abdomen: Secondary | ICD-10-CM

## 2014-06-05 DIAGNOSIS — K648 Other hemorrhoids: Secondary | ICD-10-CM

## 2014-06-05 MED ORDER — HYDROCORTISONE ACETATE 25 MG RE SUPP: 25.0000 mg | Freq: Every day | RECTAL | Status: DC

## 2014-06-05 NOTE — Patient Instructions (Signed)
We have sent the following medications to your pharmacy for you to pick up at your convenience: Anusol HC suppositories.   Call back if no improvement in your symptoms.   Thank you for choosing me and [facility].  Pricilla Riffle. Dagoberto Ligas., MD., Marval Regal

## 2014-06-05 NOTE — Progress Notes (Signed)
    History of Present Illness: This is a 53 year old white male who has noted pressure and discomfort sensation in his rectum for the past few months. His symptoms are most noticeable after a vigorous physical activity, at the end of the day or after prolonged periods of sitting. He underwent colonoscopy by Dr. Sharlett Iles in 2012 although not listed as a diagnosis on the report, the retroflexed images appear to show small internal hemorrhoids. He saw his PCP and underwent rectal examination showing internal hemorrhoids. A short course of Anusol-HC suppositories was prescribed and his symptoms substantially improved, almost have resolved.  Review of Systems: Pertinent positive and negative review of systems were noted in the above HPI section. All other review of systems were otherwise negative.  Current Medications, Allergies, Past Medical History, Past Surgical History, Family History and Social History were reviewed in [employer] record.  Physical Exam: General: Well developed , well nourished, no acute distress Head: Normocephalic and atraumatic Eyes:  sclerae anicteric, EOMI Ears: Normal auditory acuity Mouth: No deformity or lesions Neck: Supple, no masses or thyromegaly Lungs: Clear throughout to auscultation Heart: Regular rate and rhythm; no murmurs, rubs or bruits Abdomen: Soft, non tender and non distended. No masses, hepatosplenomegaly or hernias noted. Normal Bowel sounds Rectal: Deferred given recent exam by PCP Musculoskeletal: Symmetrical with no gross deformities  Skin: No lesions on visible extremities Pulses:  Normal pulses noted Extremities: No clubbing, cyanosis, edema or deformities noted Neurological: Alert oriented x 4, grossly nonfocal Cervical Nodes:  No significant cervical adenopathy Inguinal Nodes: No significant inguinal adenopathy Psychological:  Alert and cooperative. Normal mood and affect  Assessment and Recommendations:  1.  Internal hemorrhoids. Standard rectal care instructions. Begin Anusol-HC suppositories daily for 14 days and then daily as needed. If symptoms are not well controlled he is advised to contact us.  2. Diverticulosis. Long-term high fiber diet with adequate daily water intake.  3. Colorectal cancer screening, average risk. 10 year interval colonoscopy due April 2022.

## 2014-08-31 ENCOUNTER — Encounter: Payer: Self-pay | Admitting: Gastroenterology

## 2014-10-02 ENCOUNTER — Ambulatory Visit (INDEPENDENT_AMBULATORY_CARE_PROVIDER_SITE_OTHER): Payer: BC Managed Care – PPO | Admitting: Physician Assistant

## 2014-10-02 ENCOUNTER — Encounter: Payer: Self-pay | Admitting: Physician Assistant

## 2014-10-02 VITALS — BP 117/79 | HR 67 | Temp 98.1°F | Resp 16 | Ht 65.0 in | Wt 150.0 lb

## 2014-10-02 DIAGNOSIS — F338 Other recurrent depressive disorders: Secondary | ICD-10-CM | POA: Insufficient documentation

## 2014-10-02 DIAGNOSIS — F419 Anxiety disorder, unspecified: Secondary | ICD-10-CM

## 2014-10-02 DIAGNOSIS — F39 Unspecified mood [affective] disorder: Secondary | ICD-10-CM

## 2014-10-02 MED ORDER — LORAZEPAM 1 MG PO TABS: 1.0000 mg | ORAL_TABLET | Freq: Two times a day (BID) | ORAL | Status: DC

## 2014-10-02 MED ORDER — BUPROPION HCL ER (XL) 300 MG PO TB24: ORAL_TABLET | ORAL | Status: DC

## 2014-10-02 NOTE — Assessment & Plan Note (Signed)
Ativan refilled with new dosing instructions. Will begin Wellbutrin XL 150 mg daily x 1 week.  Increasing to 300 mg mg daily.  Follow-up in 1 month.

## 2014-10-02 NOTE — Patient Instructions (Signed)
Please continue Ativan as directed.  Ok to take twice daily on bad days.  Please start the Wellbutrin as directed once you are back from your trip.  I will see you in a few weeks for your physical.  If symptoms worsen or new symptoms develop, please stop medications and call the office.  Read information below on seasonal affective disorder.  Seasonal Affective Disorder  A seasonal affective disorder is a depressive reaction. It is when you feel emotionally down, which seems to come at specific times of the year. The most common time of year for this is winter. Otherwise, it behaves like a plain depression. As with other depressive disorders, there are:  Crying episodes.  Headaches.  Irritability.  Loss of energy. DIAGNOSIS  The diagnosis of this problem is usually made by the history (what has been going on). A physical exam may be done to make sure there is no other cause of your depression. TREATMENT  The treatment of seasonal affective disorders has been found to be helped immensely by photo-therapy. This means a person sits or lies for several hours per day in front of or under bright lights. The symptoms (problems) of depression respond rapidly, usually over a couple days. HOME CARE INSTRUCTIONS   Follow your caregiver's instructions for light therapy.  You must be awake during the light therapy.  If you do not respond or you feel you are getting worse, see your caregiver. Document Released: 08/26/2001 Document Revised: 02/23/2012 Document Reviewed: 03/19/2006 [facility] Patient Information 2015 [place], [place]. This information is not intended to replace advice given to you by your health care provider. Make sure you discuss any questions you have with your health care provider.

## 2014-10-02 NOTE — Assessment & Plan Note (Signed)
Concern giving history and symptoms. Patient to start Wellbutrin XL for anxiety. Reassess symptoms at follow-up.

## 2014-10-02 NOTE — Progress Notes (Signed)
Patient presents to clinic today c/o increased anxiety starting earlier this month.  Endorses in the past, he notes that his anxiety symptoms worsen in the winter months.  Is currently on Ativan 0.5 mg at bedtime. Endorses some depressed mood.  Denies SI/HII.  Some mild anhedonia noted  Still eating and sleeping well..  Past Medical History  Diagnosis Date  . Diverticulosis of colon (without mention of hemorrhage)   . Esophageal reflux   . Other symptoms involving digestive system(787.99)   . Pain in joint, shoulder region   . Abdominal pain, right lower quadrant   . Backache, unspecified   . Acute bronchospasm   . External hemorrhoids without mention of complication     thrombosed  . Inguinal hernia without mention of obstruction or gangrene, unilateral or unspecified, (not specified as recurrent)   . Pain in joint, forearm   . Thoracic or lumbosacral neuritis or radiculitis, unspecified   . Obstructive sleep apnea (adult) (pediatric)   . Hiatal hernia   . Migraine   . Chicken pox   . Seasonal allergies     Current Outpatient Prescriptions on File Prior to Visit  Medication Sig Dispense Refill  . esomeprazole (NEXIUM) 40 MG capsule Take 40 mg by mouth daily before breakfast.         No current facility-administered medications on file prior to visit.    Allergies  Allergen Reactions  . Escitalopram Hives    Pt felt anxious  Pt felt his skin was "on fire"    Family History  Problem Relation Age of Onset  . Diabetes Maternal Aunt   . Lung cancer Mother 55    Deceased  . Colon cancer Neg Hx   . Other Father     MVA  . Heart disease Paternal Grandmother   . Heart attack Paternal Grandmother   . Healthy Brother     x2  . Aneurysm Sister     Brain  . Allergies Daughter     x2  . Migraines Daughter     x1    History   Social History  . Marital Status: Married    Spouse Name: N/A    Number of Children: N/A  . Years of Education: N/A   Occupational  History  . Technical sales engineer     Social History Main Topics  . Smoking status: Former Smoker    Types: Cigarettes, Cigars    Quit date: 10/04/1987  . Smokeless tobacco: Never Used  . Alcohol Use: 3.5 - 7.0 oz/week    7-14 drink(s) per week     Comment: 2 daily   . Drug Use: No  . Sexual Activity: None   Other Topics Concern  . None   Social History Narrative   1 caffeine drink daily    Review of Systems - See HPI.  All other ROS are negative.  BP 117/79  Pulse 67  Temp(Src) 98.1 F (36.7 C) (Oral)  Resp 16  Ht 5\' 5"  (1.651 m)  Wt 150 lb (68.04 kg)  BMI 24.96 kg/m2  SpO2 99%  Physical Exam  Vitals reviewed. Constitutional: He is oriented to person, place, and time and well-developed, well-nourished, and in no distress.  HENT:  Head: Normocephalic and atraumatic.  Eyes: Conjunctivae are normal.  Neck: No thyromegaly present.  Cardiovascular: Normal rate, regular rhythm, normal heart sounds and intact distal pulses.   Pulmonary/Chest: Effort normal and breath sounds normal. No respiratory distress. He has no wheezes. He has no  rales. He exhibits no tenderness.  Neurological: He is alert and oriented to person, place, and time.  Skin: Skin is warm and dry. No rash noted.  Psychiatric:  Anxious affect   Assessment/Plan: Anxiety Ativan refilled with new dosing instructions. Will begin Wellbutrin XL 150 mg daily x 1 week.  Increasing to 300 mg mg daily.  Follow-up in 1 month.  Seasonal affective disorder Concern giving history and symptoms. Patient to start Wellbutrin XL for anxiety. Reassess symptoms at follow-up.

## 2014-10-02 NOTE — Progress Notes (Signed)
Pre visit review using our clinic review tool, if applicable. No additional management support is needed unless otherwise documented below in the visit note/SLS  

## 2014-10-17 ENCOUNTER — Encounter: Payer: Self-pay | Admitting: Physician Assistant

## 2014-10-17 ENCOUNTER — Ambulatory Visit (HOSPITAL_BASED_OUTPATIENT_CLINIC_OR_DEPARTMENT_OTHER)
Admission: RE | Admit: 2014-10-17 | Discharge: 2014-10-17 | Disposition: A | Discharge status: Home / Self Care | Payer: BC Managed Care – PPO | Source: Ambulatory Visit | Attending: Physician Assistant | Admitting: Physician Assistant

## 2014-10-17 ENCOUNTER — Ambulatory Visit (INDEPENDENT_AMBULATORY_CARE_PROVIDER_SITE_OTHER): Payer: BC Managed Care – PPO | Admitting: Physician Assistant

## 2014-10-17 VITALS — BP 128/78 | HR 73 | Temp 98.4°F | Resp 16 | Ht 65.0 in | Wt 150.2 lb

## 2014-10-17 DIAGNOSIS — N529 Male erectile dysfunction, unspecified: Secondary | ICD-10-CM | POA: Insufficient documentation

## 2014-10-17 DIAGNOSIS — M545 Low back pain, unspecified: Secondary | ICD-10-CM | POA: Insufficient documentation

## 2014-10-17 DIAGNOSIS — Z136 Encounter for screening for cardiovascular disorders: Secondary | ICD-10-CM | POA: Insufficient documentation

## 2014-10-17 DIAGNOSIS — Z0000 Encounter for general adult medical examination without abnormal findings: Secondary | ICD-10-CM

## 2014-10-17 DIAGNOSIS — N528 Other male erectile dysfunction: Secondary | ICD-10-CM

## 2014-10-17 DIAGNOSIS — K219 Gastro-esophageal reflux disease without esophagitis: Secondary | ICD-10-CM

## 2014-10-17 LAB — CBC
HCT: 44.2 % (ref 39.0–52.0)
Hemoglobin: 15 g/dL (ref 13.0–17.0)
MCHC: 34 g/dL (ref 30.0–36.0)
MCV: 91.9 fl (ref 78.0–100.0)
Platelets: 299 10*3/uL (ref 150.0–400.0)
RBC: 4.81 Mil/uL (ref 4.22–5.81)
RDW: 14.1 % (ref 11.5–15.5)
WBC: 5 10*3/uL (ref 4.0–10.5)

## 2014-10-17 LAB — URINALYSIS, ROUTINE W REFLEX MICROSCOPIC
Bilirubin Urine: NEGATIVE
Hgb urine dipstick: NEGATIVE
Ketones, ur: NEGATIVE
Leukocytes, UA: NEGATIVE
Nitrite: NEGATIVE
RBC / HPF: NONE SEEN (ref 0–?)
Specific Gravity, Urine: 1.01 (ref 1.000–1.030)
Total Protein, Urine: NEGATIVE
Urine Glucose: NEGATIVE
Urobilinogen, UA: 0.2 (ref 0.0–1.0)
WBC, UA: NONE SEEN (ref 0–?)
pH: 7 (ref 5.0–8.0)

## 2014-10-17 LAB — LIPID PANEL W/DIRECT LDL/HDL RATIO
Cholesterol: 208 mg/dL — ABNORMAL HIGH (ref 0–200)
Direct LDL: 146 mg/dL — ABNORMAL HIGH
HDL: 48 mg/dL (ref >39)
LDL:HDL Ratio: 3 Ratio
Total Chol/HDL Ratio: 4.3 Ratio
Triglycerides: 134 mg/dL (ref <150)

## 2014-10-17 LAB — HEPATIC FUNCTION PANEL
ALT: 19 U/L (ref 0–53)
AST: 24 U/L (ref 0–37)
Albumin: 3.7 g/dL (ref 3.5–5.2)
Alkaline Phosphatase: 47 U/L (ref 39–117)
Bilirubin, Direct: 0.1 mg/dL (ref 0.0–0.3)
Total Bilirubin: 0.4 mg/dL (ref 0.2–1.2)
Total Protein: 7.2 g/dL (ref 6.0–8.3)

## 2014-10-17 LAB — PSA: PSA: 0.87 ng/mL (ref 0.10–4.00)

## 2014-10-17 LAB — HEMOGLOBIN A1C: Hgb A1c MFr Bld: 5.4 % (ref 4.6–6.5)

## 2014-10-17 LAB — TSH: TSH: 1.79 u[IU]/mL (ref 0.35–4.50)

## 2014-10-17 MED ORDER — TADALAFIL 20 MG PO TABS: 20.0000 mg | ORAL_TABLET | Freq: Every day | ORAL | Status: DC | PRN

## 2014-10-17 MED ORDER — ESOMEPRAZOLE MAGNESIUM 40 MG PO CPDR: 40.0000 mg | DELAYED_RELEASE_CAPSULE | Freq: Every day | ORAL | Status: DC

## 2014-10-17 MED ORDER — MELOXICAM 15 MG PO TABS: 15.0000 mg | ORAL_TABLET | Freq: Every day | ORAL | Status: DC

## 2014-10-17 NOTE — Patient Instructions (Signed)
Please stop by the lab for blood work.  I will call you with your results.  Continue medications as directed.  For Erectile Dysfunction -- Begin Cialis taking 1/2 tablet 1 hour before desired effect.  Do not take more than 1 dose in 36 hours. Initial dosing may cause some lightheadedness.  Make sure to hydrate well before taking medication.  For Back pain -- please go downstairs for imaging.  I will call you with your results.  You will be contacted by Physical Therapy for assessment and treatment.  Take Mobic daily with food over the next 1-2 weeks.  Continue Ativan as directed as this will relax the musculature.  Apply some topical Aspercreme.  Follow-up in 1 month.  Preventive Care for Adults A healthy lifestyle and preventive care can promote health and wellness. Preventive health guidelines for men include the following key practices:  A routine yearly physical is a good way to check with your health care provider about your health and preventative screening. It is a chance to share any concerns and updates on your health and to receive a thorough exam.  Visit your dentist for a routine exam and preventative care every 6 months. Brush your teeth twice a day and floss once a day. Good oral hygiene prevents tooth decay and gum disease.  The frequency of eye exams is based on your age, health, family medical history, use of contact lenses, and other factors. Follow your health care provider's recommendations for frequency of eye exams.  Eat a healthy diet. Foods such as vegetables, fruits, whole grains, low-fat dairy products, and lean protein foods contain the nutrients you need without too many calories. Decrease your intake of foods high in solid fats, added sugars, and salt. Eat the right amount of calories for you.Get information about a proper diet from your health care provider, if necessary.  Regular physical exercise is one of the most important things you can do for your health. Most  adults should get at least 150 minutes of moderate-intensity exercise (any activity that increases your heart rate and causes you to sweat) each week. In addition, most adults need muscle-strengthening exercises on 2 or more days a week.  Maintain a healthy weight. The body mass index (BMI) is a screening tool to identify possible weight problems. It provides an estimate of body fat based on height and weight. Your health care provider can find your BMI and can help you achieve or maintain a healthy weight.For adults 20 years and older:  A BMI below 18.5 is considered underweight.  A BMI of 18.5 to 24.9 is normal.  A BMI of 25 to 29.9 is considered overweight.  A BMI of 30 and above is considered obese.  Maintain normal blood lipids and cholesterol levels by exercising and minimizing your intake of saturated fat. Eat a balanced diet with plenty of fruit and vegetables. Blood tests for lipids and cholesterol should begin at age 42 and be repeated every 5 years. If your lipid or cholesterol levels are high, you are over 50, or you are at high risk for heart disease, you may need your cholesterol levels checked more frequently.Ongoing high lipid and cholesterol levels should be treated with medicines if diet and exercise are not working.  If you smoke, find out from your health care provider how to quit. If you do not use tobacco, do not start.  Lung cancer screening is recommended for adults aged 84-80 years who are at high risk for developing lung  cancer because of a history of smoking. A yearly low-dose CT scan of the lungs is recommended for people who have at least a 30-pack-year history of smoking and are a current smoker or have quit within the past 15 years. A pack year of smoking is smoking an average of 1 pack of cigarettes a day for 1 year (for example: 1 pack a day for 30 years or 2 packs a day for 15 years). Yearly screening should continue until the smoker has stopped smoking for at  least 15 years. Yearly screening should be stopped for people who develop a health problem that would prevent them from having lung cancer treatment.  If you choose to drink alcohol, do not have more than 2 drinks per day. One drink is considered to be 12 ounces (355 mL) of beer, 5 ounces (148 mL) of wine, or 1.5 ounces (44 mL) of liquor.  Avoid use of street drugs. Do not share needles with anyone. Ask for help if you need support or instructions about stopping the use of drugs.  High blood pressure causes heart disease and increases the risk of stroke. Your blood pressure should be checked at least every 1-2 years. Ongoing high blood pressure should be treated with medicines, if weight loss and exercise are not effective.  If you are 63-8 years old, ask your health care provider if you should take aspirin to prevent heart disease.  Diabetes screening involves taking a blood sample to check your fasting blood sugar level. This should be done once every 3 years, after age 79, if you are within normal weight and without risk factors for diabetes. Testing should be considered at a younger age or be carried out more frequently if you are overweight and have at least 1 risk factor for diabetes.  Colorectal cancer can be detected and often prevented. Most routine colorectal cancer screening begins at the age of 46 and continues through age 26. However, your health care provider may recommend screening at an earlier age if you have risk factors for colon cancer. On a yearly basis, your health care provider may provide home test kits to check for hidden blood in the stool. Use of a small camera at the end of a tube to directly examine the colon (sigmoidoscopy or colonoscopy) can detect the earliest forms of colorectal cancer. Talk to your health care provider about this at age 44, when routine screening begins. Direct exam of the colon should be repeated every 5-10 years through age 48, unless early forms of  precancerous polyps or small growths are found.  People who are at an increased risk for hepatitis B should be screened for this virus. You are considered at high risk for hepatitis B if:  You were born in a country where hepatitis B occurs often. Talk with your health care provider about which countries are considered high risk.  Your parents were born in a high-risk country and you have not received a shot to protect against hepatitis B (hepatitis B vaccine).  You have HIV or AIDS.  You use needles to inject street drugs.  You live with, or have sex with, someone who has hepatitis B.  You are a man who has sex with other men (MSM).  You get hemodialysis treatment.  You take certain medicines for conditions such as cancer, organ transplantation, and autoimmune conditions.  Hepatitis C blood testing is recommended for all people born from [birth year] through 1965 and any individual with known risks for  hepatitis C.  Practice safe sex. Use condoms and avoid high-risk sexual practices to reduce the spread of sexually transmitted infections (STIs). STIs include gonorrhea, chlamydia, syphilis, trichomonas, herpes, HPV, and human immunodeficiency virus (HIV). Herpes, HIV, and HPV are viral illnesses that have no cure. They can result in disability, cancer, and death.  If you are at risk of being infected with HIV, it is recommended that you take a prescription medicine daily to prevent HIV infection. This is called preexposure prophylaxis (PrEP). You are considered at risk if:  You are a man who has sex with other men (MSM) and have other risk factors.  You are a heterosexual man, are sexually active, and are at increased risk for HIV infection.  You take drugs by injection.  You are sexually active with a partner who has HIV.  Talk with your health care provider about whether you are at high risk of being infected with HIV. If you choose to begin PrEP, you should first be tested for HIV. You  should then be tested every 3 months for as long as you are taking PrEP.  A one-time screening for abdominal aortic aneurysm (AAA) and surgical repair of large AAAs by ultrasound are recommended for men ages 51 to 56 years who are current or former smokers.  Healthy men should no longer receive prostate-specific antigen (PSA) blood tests as part of routine cancer screening. Talk with your health care provider about prostate cancer screening.  Testicular cancer screening is not recommended for adult males who have no symptoms. Screening includes self-exam, a health care provider exam, and other screening tests. Consult with your health care provider about any symptoms you have or any concerns you have about testicular cancer.  Use sunscreen. Apply sunscreen liberally and repeatedly throughout the day. You should seek shade when your shadow is shorter than you. Protect yourself by wearing long sleeves, pants, a wide-brimmed hat, and sunglasses year round, whenever you are outdoors.  Once a month, do a whole-body skin exam, using a mirror to look at the skin on your back. Tell your health care provider about new moles, moles that have irregular borders, moles that are larger than a pencil eraser, or moles that have changed in shape or color.  Stay current with required vaccines (immunizations).  Influenza vaccine. All adults should be immunized every year.  Tetanus, diphtheria, and acellular pertussis (Td, Tdap) vaccine. An adult who has not previously received Tdap or who does not know his vaccine status should receive 1 dose of Tdap. This initial dose should be followed by tetanus and diphtheria toxoids (Td) booster doses every 10 years. Adults with an unknown or incomplete history of completing a 3-dose immunization series with Td-containing vaccines should begin or complete a primary immunization series including a Tdap dose. Adults should receive a Td booster every 10 years.  Varicella vaccine.  An adult without evidence of immunity to varicella should receive 2 doses or a second dose if he has previously received 1 dose.  Human papillomavirus (HPV) vaccine. Males aged 20-21 years who have not received the vaccine previously should receive the 3-dose series. Males aged 22-26 years may be immunized. Immunization is recommended through the age of 33 years for any male who has sex with males and did not get any or all doses earlier. Immunization is recommended for any person with an immunocompromised condition through the age of 45 years if he did not get any or all doses earlier. During the 3-dose series, the  second dose should be obtained 4-8 weeks after the first dose. The third dose should be obtained 24 weeks after the first dose and 16 weeks after the second dose.  Zoster vaccine. One dose is recommended for adults aged 90 years or older unless certain conditions are present.  Measles, mumps, and rubella (MMR) vaccine. Adults born before [birth year] generally are considered immune to measles and mumps. Adults born in [birth year] or later should have 1 or more doses of MMR vaccine unless there is a contraindication to the vaccine or there is laboratory evidence of immunity to each of the three diseases. A routine second dose of MMR vaccine should be obtained at least 28 days after the first dose for students attending postsecondary schools, health care workers, or international travelers. People who received inactivated measles vaccine or an unknown type of measles vaccine during 1963-1967 should receive 2 doses of MMR vaccine. People who received inactivated mumps vaccine or an unknown type of mumps vaccine before 1979 and are at high risk for mumps infection should consider immunization with 2 doses of MMR vaccine. Unvaccinated health care workers born before [birth year] who lack laboratory evidence of measles, mumps, or rubella immunity or laboratory confirmation of disease should consider measles and mumps  immunization with 2 doses of MMR vaccine or rubella immunization with 1 dose of MMR vaccine.  Pneumococcal 13-valent conjugate (PCV13) vaccine. When indicated, a person who is uncertain of his immunization history and has no record of immunization should receive the PCV13 vaccine. An adult aged 18 years or older who has certain medical conditions and has not been previously immunized should receive 1 dose of PCV13 vaccine. This PCV13 should be followed with a dose of pneumococcal polysaccharide (PPSV23) vaccine. The PPSV23 vaccine dose should be obtained at least 8 weeks after the dose of PCV13 vaccine. An adult aged 26 years or older who has certain medical conditions and previously received 1 or more doses of PPSV23 vaccine should receive 1 dose of PCV13. The PCV13 vaccine dose should be obtained 1 or more years after the last PPSV23 vaccine dose.  Pneumococcal polysaccharide (PPSV23) vaccine. When PCV13 is also indicated, PCV13 should be obtained first. All adults aged 47 years and older should be immunized. An adult younger than age 48 years who has certain medical conditions should be immunized. Any person who resides in a nursing home or long-term care facility should be immunized. An adult smoker should be immunized. People with an immunocompromised condition and certain other conditions should receive both PCV13 and PPSV23 vaccines. People with human immunodeficiency virus (HIV) infection should be immunized as soon as possible after diagnosis. Immunization during chemotherapy or radiation therapy should be avoided. Routine use of PPSV23 vaccine is not recommended for American Indians, [place] Natives, or people younger than 65 years unless there are medical conditions that require PPSV23 vaccine. When indicated, people who have unknown immunization and have no record of immunization should receive PPSV23 vaccine. One-time revaccination 5 years after the first dose of PPSV23 is recommended for people aged  19-64 years who have chronic kidney failure, nephrotic syndrome, asplenia, or immunocompromised conditions. People who received 1-2 doses of PPSV23 before age 60 years should receive another dose of PPSV23 vaccine at age 38 years or later if at least 5 years have passed since the previous dose. Doses of PPSV23 are not needed for people immunized with PPSV23 at or after age 84 years.  Meningococcal vaccine. Adults with asplenia or persistent complement component deficiencies should receive  2 doses of quadrivalent meningococcal conjugate (MenACWY-D) vaccine. The doses should be obtained at least 2 months apart. Microbiologists working with certain meningococcal bacteria, [employer], people at risk during an outbreak, and people who travel to or live in countries with a high rate of meningitis should be immunized. A first-year college student up through age 30 years who is living in a residence hall should receive a dose if he did not receive a dose on or after his 16th birthday. Adults who have certain high-risk conditions should receive one or more doses of vaccine.  Hepatitis A vaccine. Adults who wish to be protected from this disease, have certain high-risk conditions, work with hepatitis A-infected animals, work in hepatitis A research labs, or travel to or work in countries with a high rate of hepatitis A should be immunized. Adults who were previously unvaccinated and who anticipate close contact with an international adoptee during the first 60 days after arrival in the [place] from a country with a high rate of hepatitis A should be immunized.  Hepatitis B vaccine. Adults should be immunized if they wish to be protected from this disease, have certain high-risk conditions, may be exposed to blood or other infectious body fluids, are household contacts or sex partners of hepatitis B positive people, are clients or workers in certain care facilities, or travel to or work in countries with a  high rate of hepatitis B.  Haemophilus influenzae type b (Hib) vaccine. A previously unvaccinated person with asplenia or sickle cell disease or having a scheduled splenectomy should receive 1 dose of Hib vaccine. Regardless of previous immunization, a recipient of a hematopoietic stem cell transplant should receive a 3-dose series 6-12 months after his successful transplant. Hib vaccine is not recommended for adults with HIV infection. Preventive Service / Frequency Ages 69 to 16  Blood pressure check.** / Every 1 to 2 years.  Lipid and cholesterol check.** / Every 5 years beginning at age 24.  Hepatitis C blood test.** / For any individual with known risks for hepatitis C.  Skin self-exam. / Monthly.  Influenza vaccine. / Every year.  Tetanus, diphtheria, and acellular pertussis (Tdap, Td) vaccine.** / Consult your health care provider. 1 dose of Td every 10 years.  Varicella vaccine.** / Consult your health care provider.  HPV vaccine. / 3 doses over 6 months, if 60 or younger.  Measles, mumps, rubella (MMR) vaccine.** / You need at least 1 dose of MMR if you were born in 1957 or later. You may also need a second dose.  Pneumococcal 13-valent conjugate (PCV13) vaccine.** / Consult your health care provider.  Pneumococcal polysaccharide (PPSV23) vaccine.** / 1 to 2 doses if you smoke cigarettes or if you have certain conditions.  Meningococcal vaccine.** / 1 dose if you are age 66 to 62 years and a Market researcher living in a residence hall, or have one of several medical conditions. You may also need additional booster doses.  Hepatitis A vaccine.** / Consult your health care provider.  Hepatitis B vaccine.** / Consult your health care provider.  Haemophilus influenzae type b (Hib) vaccine.** / Consult your health care provider. Ages 26 to 47  Blood pressure check.** / Every 1 to 2 years.  Lipid and cholesterol check.** / Every 5 years beginning at age 63.  Lung  cancer screening. / Every year if you are aged 34-80 years and have a 30-pack-year history of smoking and currently smoke or have quit within the past 15 years.  Yearly screening is stopped once you have quit smoking for at least 15 years or develop a health problem that would prevent you from having lung cancer treatment.  Fecal occult blood test (FOBT) of stool. / Every year beginning at age 44 and continuing until age 34. You may not have to do this test if you get a colonoscopy every 10 years.  Flexible sigmoidoscopy** or colonoscopy.** / Every 5 years for a flexible sigmoidoscopy or every 10 years for a colonoscopy beginning at age 23 and continuing until age 66.  Hepatitis C blood test.** / For all people born from [birth year] through 1965 and any individual with known risks for hepatitis C.  Skin self-exam. / Monthly.  Influenza vaccine. / Every year.  Tetanus, diphtheria, and acellular pertussis (Tdap/Td) vaccine.** / Consult your health care provider. 1 dose of Td every 10 years.  Varicella vaccine.** / Consult your health care provider.  Zoster vaccine.** / 1 dose for adults aged 81 years or older.  Measles, mumps, rubella (MMR) vaccine.** / You need at least 1 dose of MMR if you were born in 1957 or later. You may also need a second dose.  Pneumococcal 13-valent conjugate (PCV13) vaccine.** / Consult your health care provider.  Pneumococcal polysaccharide (PPSV23) vaccine.** / 1 to 2 doses if you smoke cigarettes or if you have certain conditions.  Meningococcal vaccine.** / Consult your health care provider.  Hepatitis A vaccine.** / Consult your health care provider.  Hepatitis B vaccine.** / Consult your health care provider.  Haemophilus influenzae type b (Hib) vaccine.** / Consult your health care provider. Ages 83 and over  Blood pressure check.** / Every 1 to 2 years.  Lipid and cholesterol check.**/ Every 5 years beginning at age 60.  Lung cancer screening. / Every  year if you are aged 73-80 years and have a 30-pack-year history of smoking and currently smoke or have quit within the past 15 years. Yearly screening is stopped once you have quit smoking for at least 15 years or develop a health problem that would prevent you from having lung cancer treatment.  Fecal occult blood test (FOBT) of stool. / Every year beginning at age 11 and continuing until age 62. You may not have to do this test if you get a colonoscopy every 10 years.  Flexible sigmoidoscopy** or colonoscopy.** / Every 5 years for a flexible sigmoidoscopy or every 10 years for a colonoscopy beginning at age 60 and continuing until age 51.  Hepatitis C blood test.** / For all people born from [birth year] through 1966 and any individual with known risks for hepatitis C.  Abdominal aortic aneurysm (AAA) screening.** / A one-time screening for ages 33 to 23 years who are current or former smokers.  Skin self-exam. / Monthly.  Influenza vaccine. / Every year.  Tetanus, diphtheria, and acellular pertussis (Tdap/Td) vaccine.** / 1 dose of Td every 10 years.  Varicella vaccine.** / Consult your health care provider.  Zoster vaccine.** / 1 dose for adults aged 73 years or older.  Pneumococcal 13-valent conjugate (PCV13) vaccine.** / Consult your health care provider.  Pneumococcal polysaccharide (PPSV23) vaccine.** / 1 dose for all adults aged 73 years and older.  Meningococcal vaccine.** / Consult your health care provider.  Hepatitis A vaccine.** / Consult your health care provider.  Hepatitis B vaccine.** / Consult your health care provider.  Haemophilus influenzae type b (Hib) vaccine.** / Consult your health care provider. **Family history and personal history of risk and conditions may change  your health care provider's recommendations. Document Released: 01/27/2002 Document Revised: 12/06/2013 Document Reviewed: 04/28/2011 [facility] Patient Information 2015 [place], [place]. This information  is not intended to replace advice given to you by your health care provider. Make sure you discuss any questions you have with your health care provider.

## 2014-10-17 NOTE — Progress Notes (Signed)
Patient presents to clinic today for annual exam.  Patient is fasting for labs.  Acute Concerns: Erectile Dysfunction -- Previously well-controlled with Cialis.  Denies history of BPH or elevated PSA.  Endorses some history of Low T in the past but does not wish to proceed with treatment for this.  Endorses some intermittent muscular lower back pain over the past several months without history of trauma or injury.  Denies radiation of pain. Has never had imaging of lower back.  Has been performing stretching exercises with some improvement in symptoms. Would like referral to PT.  Chronic Issues: GERD -- Patient endorses swell controlled with Nexium.   Denies nausea/vomiting or abdominal pain.  Needs refill of medication.  Health Maintenance: Dental -- up-to-date Vision -- up-to-date Immunizations -- Had flu shot with Employer.  Endorses Tetanus up-to-date. Colonoscopy -- Up-to-date.  Past Medical History  Diagnosis Date  . Diverticulosis of colon (without mention of hemorrhage)   . Esophageal reflux   . Other symptoms involving digestive system(787.99)   . Pain in joint, shoulder region   . Abdominal pain, right lower quadrant   . Backache, unspecified   . Acute bronchospasm   . External hemorrhoids without mention of complication     thrombosed  . Inguinal hernia without mention of obstruction or gangrene, unilateral or unspecified, (not specified as recurrent)   . Pain in joint, forearm   . Thoracic or lumbosacral neuritis or radiculitis, unspecified   . Obstructive sleep apnea (adult) (pediatric)   . Hiatal hernia   . Migraine   . Chicken pox   . Seasonal allergies     Past Surgical History  Procedure Laterality Date  . Colon resection  10/06    sigmoid colon  . Wisdom tooth extraction    . Inguinal hernia repair  12/2012    Left    Current Outpatient Prescriptions on File Prior to Visit  Medication Sig Dispense Refill  . buPROPion (WELLBUTRIN XL) 300 MG 24 hr  tablet Take 1/2 tablet by mouth daily for 1 week.  Then increase to 1 tablet by mouth daily 30 tablet 1  . LORazepam (ATIVAN) 1 MG tablet Take 1 tablet (1 mg total) by mouth 2 (two) times daily. 60 tablet 1   No current facility-administered medications on file prior to visit.    Allergies  Allergen Reactions  . Escitalopram Hives    Pt felt anxious  Pt felt his skin was "on fire"    Family History  Problem Relation Age of Onset  . Diabetes Maternal Aunt   . Lung cancer Mother 43    Deceased  . Colon cancer Neg Hx   . Other Father     MVA  . Heart disease Paternal Grandmother   . Heart attack Paternal Grandmother   . Healthy Brother     x2  . Aneurysm Sister     Brain  . Allergies Daughter     x2  . Migraines Daughter     x1    History   Social History  . Marital Status: Married    Spouse Name: N/A    Number of Children: N/A  . Years of Education: N/A   Occupational History  . Technical sales engineer     Social History Main Topics  . Smoking status: Former Smoker    Types: Cigarettes, Cigars    Quit date: 10/04/1987  . Smokeless tobacco: Never Used  . Alcohol Use: 3.5 - 7.0 oz/week    7-14  drink(s) per week     Comment: 2 daily   . Drug Use: No  . Sexual Activity: Not on file   Other Topics Concern  . Not on file   Social History Narrative   1 caffeine drink daily    Review of Systems  Constitutional: Negative for fever and weight loss.  HENT: Negative for ear discharge, ear pain, hearing loss and tinnitus.   Eyes: Negative for blurred vision, double vision, photophobia and pain.  Respiratory: Negative for shortness of breath.   Cardiovascular: Negative for chest pain and palpitations.  Gastrointestinal: Negative for heartburn, nausea, vomiting, abdominal pain, diarrhea, constipation, blood in stool and melena.  Genitourinary: Negative for dysuria, urgency, frequency, hematuria and flank pain.  Neurological: Negative for dizziness, loss of consciousness  and headaches.  Endo/Heme/Allergies: Negative for environmental allergies.  Psychiatric/Behavioral: Negative for depression, suicidal ideas, hallucinations and substance abuse. The patient is not nervous/anxious and does not have insomnia.    BP 128/78 mmHg  Pulse 73  Temp(Src) 98.4 F (36.9 C) (Oral)  Resp 16  Ht 5\' 5"  (1.651 m)  Wt 150 lb 4 oz (68.153 kg)  BMI 25.00 kg/m2  SpO2 100%  Physical Exam  Constitutional: He is oriented to person, place, and time and well-developed, well-nourished, and in no distress.  HENT:  Head: Normocephalic and atraumatic.  Right Ear: External ear normal.  Left Ear: External ear normal.  Nose: Nose normal.  Mouth/Throat: Oropharynx is clear and moist. No oropharyngeal exudate.  TM within normal limits bilaterally.  Eyes: Conjunctivae are normal. Pupils are equal, round, and reactive to light.  Neck: Neck supple. No thyromegaly present.  Cardiovascular: Normal rate, regular rhythm, normal heart sounds and intact distal pulses.   Pulmonary/Chest: Effort normal and breath sounds normal. No respiratory distress. He has no wheezes. He has no rales. He exhibits no tenderness.  Abdominal: Soft. Bowel sounds are normal. He exhibits no distension and no mass. There is no tenderness. There is no rebound and no guarding.  Musculoskeletal:       Cervical back: Normal.       Thoracic back: Normal.       Lumbar back: He exhibits tenderness and spasm. He exhibits normal range of motion and no bony tenderness.  Lymphadenopathy:    He has no cervical adenopathy.  Neurological: He is alert and oriented to person, place, and time. No cranial nerve deficit.  Skin: Skin is warm. No rash noted.  Psychiatric: Affect normal.  Vitals reviewed.  Assessment/Plan: Esophageal reflux Doing well on current regimen.  Medications refilled.  GERD diet discussed with patient.  Erectile dysfunction of organic origin Will begin Cialis 10 mg. Will check PSA level today for  prostate cancer screening.  Visit for preventive health examination I have reviewed the patient's medical history in detail and updated the computerized patient record.  EKG obtained today revealing ---. Body mass index is 25 kg/(m^2).  Discussed healthy diet and exercise.  Discussed Prostate Cancer screening including DRE and PSA testing.  Patient asymptomatic and defers DRE. Will obtain PSA at today's visit.  Will obtain fasting labs at today's visit. Handout given to patient.  All other Health Maintenance up-to-date.    Midline low back pain without sciatica Seems consistent with OA and some occasional spasms.  Continue Ativan. Rx Mobic. Referral to PT placed. Will obtain x-ray of lumbar spine today to further assess.

## 2014-10-17 NOTE — Assessment & Plan Note (Signed)
Seems consistent with OA and some occasional spasms.  Continue Ativan. Rx Mobic. Referral to PT placed. Will obtain x-ray of lumbar spine today to further assess.

## 2014-10-17 NOTE — Assessment & Plan Note (Signed)
Will begin Cialis 10 mg. Will check PSA level today for prostate cancer screening.

## 2014-10-17 NOTE — Assessment & Plan Note (Signed)
I have reviewed the patient's medical history in detail and updated the computerized patient record.  EKG obtained today revealing ---. Body mass index is 25 kg/(m^2).  Discussed healthy diet and exercise.  Discussed Prostate Cancer screening including DRE and PSA testing.  Patient asymptomatic and defers DRE. Will obtain PSA at today's visit.  Will obtain fasting labs at today's visit. Handout given to patient.  All other Health Maintenance up-to-date.

## 2014-10-17 NOTE — Assessment & Plan Note (Signed)
Doing well on current regimen.  Medications refilled.  GERD diet discussed with patient.

## 2014-10-17 NOTE — Progress Notes (Signed)
Pre visit review using our clinic review tool, if applicable. No additional management support is needed unless otherwise documented below in the visit note/SLS  

## 2014-10-18 LAB — BASIC METABOLIC PANEL WITH GFR
BUN: 15 mg/dL (ref 6–23)
CO2: 23 mEq/L (ref 19–32)
Calcium: 9.6 mg/dL (ref 8.4–10.5)
Chloride: 105 mEq/L (ref 96–112)
Creat: 1.03 mg/dL (ref 0.50–1.35)
GFR, Est African American: >89 mL/min
GFR, Est Non African American: 82 mL/min
Glucose, Bld: 85 mg/dL (ref 70–99)
Potassium: 5.1 mEq/L (ref 3.5–5.3)
Sodium: 142 mEq/L (ref 135–145)

## 2014-10-31 ENCOUNTER — Ambulatory Visit: Payer: 59 | Attending: Physician Assistant | Admitting: Physical Therapy

## 2014-11-08 ENCOUNTER — Encounter: Payer: Self-pay | Admitting: Physician Assistant

## 2014-11-08 ENCOUNTER — Ambulatory Visit (INDEPENDENT_AMBULATORY_CARE_PROVIDER_SITE_OTHER): Payer: BC Managed Care – PPO | Admitting: Physician Assistant

## 2014-11-08 VITALS — BP 130/73 | HR 65 | Temp 98.0°F | Resp 16 | Ht 65.0 in | Wt 152.5 lb

## 2014-11-08 DIAGNOSIS — N528 Other male erectile dysfunction: Secondary | ICD-10-CM

## 2014-11-08 DIAGNOSIS — Z136 Encounter for screening for cardiovascular disorders: Secondary | ICD-10-CM

## 2014-11-08 DIAGNOSIS — N529 Male erectile dysfunction, unspecified: Secondary | ICD-10-CM

## 2014-11-08 MED ORDER — TADALAFIL 20 MG PO TABS: 20.0000 mg | ORAL_TABLET | Freq: Every day | ORAL | Status: DC | PRN

## 2014-11-08 NOTE — Progress Notes (Signed)
Patient presents to clinic today for screening EKG.  Could not be obtained at time of CPE as the EKG system was down.  Patient is asymptomatic.  Denies hx of HTN, MI or CAD. Is taking an 81 mg ASA daily.  Past Medical History  Diagnosis Date  . Diverticulosis of colon (without mention of hemorrhage)   . Esophageal reflux   . Other symptoms involving digestive system(787.99)   . Pain in joint, shoulder region   . Abdominal pain, right lower quadrant   . Backache, unspecified   . Acute bronchospasm   . External hemorrhoids without mention of complication     thrombosed  . Inguinal hernia without mention of obstruction or gangrene, unilateral or unspecified, (not specified as recurrent)   . Pain in joint, forearm   . Thoracic or lumbosacral neuritis or radiculitis, unspecified   . Obstructive sleep apnea (adult) (pediatric)   . Hiatal hernia   . Migraine   . Chicken pox   . Seasonal allergies     Current Outpatient Prescriptions on File Prior to Visit  Medication Sig Dispense Refill  . buPROPion (WELLBUTRIN XL) 300 MG 24 hr tablet Take 1/2 tablet by mouth daily for 1 week.  Then increase to 1 tablet by mouth daily 30 tablet 1  . esomeprazole (NEXIUM) 40 MG capsule Take 1 capsule (40 mg total) by mouth daily before breakfast. 90 capsule 1  . LORazepam (ATIVAN) 1 MG tablet Take 1 tablet (1 mg total) by mouth 2 (two) times daily. 60 tablet 1  . meloxicam (MOBIC) 15 MG tablet Take 1 tablet (15 mg total) by mouth daily. 30 tablet 0   No current facility-administered medications on file prior to visit.    Allergies  Allergen Reactions  . Escitalopram Hives    Pt felt anxious  Pt felt his skin was "on fire"    Family History  Problem Relation Age of Onset  . Diabetes Maternal Aunt   . Lung cancer Mother 75    Deceased  . Colon cancer Neg Hx   . Other Father     MVA  . Heart disease Paternal Grandmother   . Heart attack Paternal Grandmother   . Healthy Brother     x2  .  Aneurysm Sister     Brain  . Allergies Daughter     x2  . Migraines Daughter     x1    History   Social History  . Marital Status: Married    Spouse Name: N/A    Number of Children: N/A  . Years of Education: N/A   Occupational History  . Technical sales engineer     Social History Main Topics  . Smoking status: Former Smoker    Types: Cigarettes, Cigars    Quit date: 10/04/1987  . Smokeless tobacco: Never Used  . Alcohol Use: 3.5 - 7.0 oz/week    7-14 drink(s) per week     Comment: 2 daily   . Drug Use: No  . Sexual Activity: None   Other Topics Concern  . None   Social History Narrative   1 caffeine drink daily     Review of Systems - See HPI.  All other ROS are negative.  BP 130/73 mmHg  Pulse 65  Temp(Src) 98 F (36.7 C) (Oral)  Resp 16  Ht 5' 5" (1.651 m)  Wt 152 lb 8 oz (69.174 kg)  BMI 25.38 kg/m2  SpO2 100%  Physical Exam  Constitutional: He is oriented to  person, place, and time and well-developed, well-nourished, and in no distress.  HENT:  Head: Normocephalic and atraumatic.  Cardiovascular: Normal rate, regular rhythm, normal heart sounds and intact distal pulses.   Pulmonary/Chest: Effort normal and breath sounds normal. No respiratory distress. He has no wheezes. He has no rales. He exhibits no tenderness.  Neurological: He is alert and oriented to person, place, and time.  Skin: Skin is warm and dry. No rash noted.  Psychiatric: Affect normal.  Vitals reviewed.   Recent Results (from the past 2160 hour(s))  BASIC METABOLIC PANEL WITH GFR     Status: None   Collection Time: 10/17/14  9:30 AM  Result Value Ref Range   Sodium 142 135 - 145 mEq/L   Potassium 5.1 3.5 - 5.3 mEq/L   Chloride 105 96 - 112 mEq/L   CO2 23 19 - 32 mEq/L   Glucose, Bld 85 70 - 99 mg/dL   BUN 15 6 - 23 mg/dL   Creat 1.03 0.50 - 1.35 mg/dL   Calcium 9.6 8.4 - 10.5 mg/dL   GFR, Est African American >89 mL/min   GFR, Est Non African American 82 mL/min    Comment:     The estimated GFR is a calculation valid for adults (>=18 years old) that uses the CKD-EPI algorithm to adjust for age and sex. It is   not to be used for children, pregnant women, hospitalized patients,    patients on dialysis, or with rapidly changing kidney function. According to the NKDEP, eGFR >89 is normal, 60-89 shows mild impairment, 30-59 shows moderate impairment, 15-29 shows severe impairment and <15 is ESRD.     Lipid Panel w/Direct LDL:HDL Ratio     Status: Abnormal   Collection Time: 10/17/14  9:30 AM  Result Value Ref Range   Cholesterol 208 (H) 0 - 200 mg/dL   Triglycerides 134 <150 mg/dL   HDL 48 >39 mg/dL   Total Chol/HDL Ratio 4.3 Ratio   Direct LDL 146 (H) mg/dL    Comment: ATP III Classification (LDL):       < 100        mg/dL         Optimal      100 - 129     mg/dL         Near or Above Optimal      130 - 159     mg/dL         Borderline High      160 - 189     mg/dL         High       > 190        mg/dL         Very High      LDL:HDL Ratio 3.0 Ratio    Comment:                        LDL/HDL Ratio Table                                        Men       Women          1/2 Average Risk              1.0        1.5                Average Risk              3.6        3.2          2 X Average Risk              6.3        5.0          3 X Average Risk              8.0        6.1   [Kannel WB, Castelli WP, and Gordon T, Ann Int Med, 1979, 90:85]     Urinalysis, Routine w reflex microscopic     Status: None   Collection Time: 10/17/14  9:30 AM  Result Value Ref Range   Color, Urine YELLOW Yellow;Lt. Yellow   APPearance CLEAR Clear   Specific Gravity, Urine 1.010 1.000-1.030   pH 7.0 5.0 - 8.0   Total Protein, Urine NEGATIVE Negative   Urine Glucose NEGATIVE Negative   Ketones, ur NEGATIVE Negative   Bilirubin Urine NEGATIVE Negative   Hgb urine dipstick NEGATIVE Negative   Urobilinogen, UA 0.2 0.0 - 1.0   Leukocytes, UA NEGATIVE Negative   Nitrite  NEGATIVE Negative   WBC, UA none seen 0-2/hpf   RBC / HPF none seen 0-2/hpf  CBC     Status: None   Collection Time: 10/17/14  9:30 AM  Result Value Ref Range   WBC 5.0 4.0 - 10.5 K/uL   RBC 4.81 4.22 - 5.81 Mil/uL   Platelets 299.0 150.0 - 400.0 K/uL   Hemoglobin 15.0 13.0 - 17.0 g/dL   HCT 44.2 39.0 - 52.0 %   MCV 91.9 78.0 - 100.0 fl   MCHC 34.0 30.0 - 36.0 g/dL   RDW 14.1 11.5 - 15.5 %  Hemoglobin A1c     Status: None   Collection Time: 10/17/14  9:30 AM  Result Value Ref Range   Hgb A1c MFr Bld 5.4 4.6 - 6.5 %    Comment: Glycemic Control Guidelines for People with Diabetes:Non Diabetic:  <6%Goal of Therapy: <7%Additional Action Suggested:  >8%   Hepatic function panel     Status: None   Collection Time: 10/17/14  9:30 AM  Result Value Ref Range   Total Bilirubin 0.4 0.2 - 1.2 mg/dL   Bilirubin, Direct 0.1 0.0 - 0.3 mg/dL   Alkaline Phosphatase 47 39 - 117 U/L   AST 24 0 - 37 U/L   ALT 19 0 - 53 U/L   Total Protein 7.2 6.0 - 8.3 g/dL   Albumin 3.7 3.5 - 5.2 g/dL  TSH     Status: None   Collection Time: 10/17/14  9:30 AM  Result Value Ref Range   TSH 1.79 0.35 - 4.50 uIU/mL  PSA     Status: None   Collection Time: 10/17/14  9:30 AM  Result Value Ref Range   PSA 0.87 0.10 - 4.00 ng/mL    Assessment/Plan: Screening for ischemic heart disease EKG reveals NSR. Continue 81 mg ASA. Follow-up yearly for physical.      

## 2014-11-08 NOTE — Progress Notes (Signed)
Pre visit review using our clinic review tool, if applicable. No additional management support is needed unless otherwise documented below in the visit note/SLS  

## 2014-11-08 NOTE — Assessment & Plan Note (Signed)
EKG reveals NSR. Continue 81 mg ASA. Follow-up yearly for physical.

## 2014-11-20 ENCOUNTER — Ambulatory Visit: Payer: BC Managed Care – PPO | Attending: Physician Assistant | Admitting: Physical Therapy

## 2014-11-20 DIAGNOSIS — M545 Low back pain: Secondary | ICD-10-CM | POA: Diagnosis not present

## 2014-11-23 ENCOUNTER — Ambulatory Visit: Payer: BC Managed Care – PPO | Admitting: Physical Therapy

## 2014-11-23 DIAGNOSIS — M545 Low back pain: Secondary | ICD-10-CM | POA: Diagnosis not present

## 2014-11-27 ENCOUNTER — Ambulatory Visit: Payer: BC Managed Care – PPO | Admitting: Physical Therapy

## 2014-11-30 ENCOUNTER — Ambulatory Visit: Payer: BC Managed Care – PPO | Admitting: Physical Therapy

## 2014-11-30 DIAGNOSIS — M545 Low back pain: Secondary | ICD-10-CM | POA: Diagnosis not present

## 2014-12-04 ENCOUNTER — Ambulatory Visit: Payer: BC Managed Care – PPO

## 2014-12-04 DIAGNOSIS — M545 Low back pain: Secondary | ICD-10-CM | POA: Diagnosis not present

## 2014-12-13 ENCOUNTER — Ambulatory Visit: Payer: BC Managed Care – PPO

## 2014-12-19 ENCOUNTER — Ambulatory Visit: Payer: 59 | Attending: Physician Assistant

## 2014-12-19 DIAGNOSIS — M545 Low back pain: Secondary | ICD-10-CM | POA: Insufficient documentation

## 2014-12-26 ENCOUNTER — Ambulatory Visit: Payer: 59

## 2014-12-26 DIAGNOSIS — M545 Low back pain: Secondary | ICD-10-CM | POA: Diagnosis not present

## 2015-01-02 ENCOUNTER — Encounter: Payer: Self-pay | Admitting: Family Medicine

## 2015-01-02 ENCOUNTER — Ambulatory Visit: Payer: 59

## 2015-01-02 ENCOUNTER — Ambulatory Visit (INDEPENDENT_AMBULATORY_CARE_PROVIDER_SITE_OTHER): Payer: 59 | Admitting: Family Medicine

## 2015-01-02 VITALS — BP 124/83 | HR 86 | Temp 98.1°F | Ht 65.0 in | Wt 156.4 lb

## 2015-01-02 DIAGNOSIS — K219 Gastro-esophageal reflux disease without esophagitis: Secondary | ICD-10-CM

## 2015-01-02 DIAGNOSIS — J209 Acute bronchitis, unspecified: Secondary | ICD-10-CM

## 2015-01-02 MED ORDER — AZITHROMYCIN 250 MG PO TABS: ORAL_TABLET | ORAL | Status: DC

## 2015-01-02 MED ORDER — BENZONATATE 200 MG PO CAPS: 200.0000 mg | ORAL_CAPSULE | Freq: Three times a day (TID) | ORAL | Status: DC | PRN

## 2015-01-02 NOTE — Patient Instructions (Addendum)
Encouraged increased rest and hydration, add probiotics such as Digestive Advantage or phillips colon health   zinc such as Coldeze or Xicam. Treat fevers as needed. Mucinex/Guaifenasin twice daily  Acute Bronchitis Bronchitis is inflammation of the airways that extend from the windpipe into the lungs (bronchi). The inflammation often causes mucus to develop. This leads to a cough, which is the most common symptom of bronchitis.  In acute bronchitis, the condition usually develops suddenly and goes away over time, usually in a couple weeks. Smoking, allergies, and asthma can make bronchitis worse. Repeated episodes of bronchitis may cause further lung problems.  CAUSES Acute bronchitis is most often caused by the same virus that causes a cold. The virus can spread from person to person (contagious) through coughing, sneezing, and touching contaminated objects. SIGNS AND SYMPTOMS   Cough.   Fever.   Coughing up mucus.   Body aches.   Chest congestion.   Chills.   Shortness of breath.   Sore throat.  DIAGNOSIS  Acute bronchitis is usually diagnosed through a physical exam. Your health care provider will also ask you questions about your medical history. Tests, such as chest X-rays, are sometimes done to rule out other conditions.  TREATMENT  Acute bronchitis usually goes away in a couple weeks. Oftentimes, no medical treatment is necessary. Medicines are sometimes given for relief of fever or cough. Antibiotic medicines are usually not needed but may be prescribed in certain situations. In some cases, an inhaler may be recommended to help reduce shortness of breath and control the cough. A cool mist vaporizer may also be used to help thin bronchial secretions and make it easier to clear the chest.  HOME CARE INSTRUCTIONS  Get plenty of rest.   Drink enough fluids to keep your urine clear or pale yellow (unless you have a medical condition that requires fluid restriction).  Increasing fluids may help thin your respiratory secretions (sputum) and reduce chest congestion, and it will prevent dehydration.   Take medicines only as directed by your health care provider.  If you were prescribed an antibiotic medicine, finish it all even if you start to feel better.  Avoid smoking and secondhand smoke. Exposure to cigarette smoke or irritating chemicals will make bronchitis worse. If you are a smoker, consider using nicotine gum or skin patches to help control withdrawal symptoms. Quitting smoking will help your lungs heal faster.   Reduce the chances of another bout of acute bronchitis by washing your hands frequently, avoiding people with cold symptoms, and trying not to touch your hands to your mouth, nose, or eyes.   Keep all follow-up visits as directed by your health care provider.  SEEK MEDICAL CARE IF: Your symptoms do not improve after 1 week of treatment.  SEEK IMMEDIATE MEDICAL CARE IF:  You develop an increased fever or chills.   You have chest pain.   You have severe shortness of breath.  You have bloody sputum.   You develop dehydration.  You faint or repeatedly feel like you are going to pass out.  You develop repeated vomiting.  You develop a severe headache. MAKE SURE YOU:   Understand these instructions.  Will watch your condition.  Will get help right away if you are not doing well or get worse. Document Released: 01/08/2005 Document Revised: 04/17/2014 Document Reviewed: 05/24/2013 [facility] Patient Information 2015 [place], [place]. This information is not intended to replace advice given to you by your health care provider. Make sure you discuss any questions  you have with your health care provider. , zinc such as Coldeze or Xicam. Treat fevers as needed. Mucinex twice daily

## 2015-01-02 NOTE — Progress Notes (Signed)
Curtis Luna  [id number] Dec 24, 1960 01/02/2015      Progress Note-Follow Up  Subjective  Chief Complaint  Chief Complaint  Patient presents with  . Fatigue    along with chest congestion,cough(dry),achy,alittle st-sxs started on Friday    HPI  Patient is a 54 y.o. male in today for routine medical care. Patient in with several days worth of respiratory symptoms. Started with Sore throat and fatigue, several days later developed a cough and chest discomfort. Cough is essentially dry but does feel some PND, denies f/c/head congestion/sob/ palp. No GI or GU concerns  Past Medical History  Diagnosis Date  . Diverticulosis of colon (without mention of hemorrhage)   . Esophageal reflux   . Other symptoms involving digestive system(787.99)   . Pain in joint, shoulder region   . Abdominal pain, right lower quadrant   . Backache, unspecified   . Acute bronchospasm   . External hemorrhoids without mention of complication     thrombosed  . Inguinal hernia without mention of obstruction or gangrene, unilateral or unspecified, (not specified as recurrent)   . Pain in joint, forearm   . Thoracic or lumbosacral neuritis or radiculitis, unspecified   . Obstructive sleep apnea (adult) (pediatric)   . Hiatal hernia   . Migraine   . Chicken pox   . Seasonal allergies     Past Surgical History  Procedure Laterality Date  . Colon resection  10/06    sigmoid colon  . Wisdom tooth extraction    . Inguinal hernia repair  12/2012    Left    Family History  Problem Relation Age of Onset  . Diabetes Maternal Aunt   . Lung cancer Mother 66    Deceased  . Colon cancer Neg Hx   . Other Father     MVA  . Heart disease Paternal Grandmother   . Heart attack Paternal Grandmother   . Healthy Brother     x2  . Aneurysm Sister     Brain  . Allergies Daughter     x2  . Migraines Daughter     x1    History   Social History  . Marital Status: Married    Spouse Name: N/A    Number  of Children: N/A  . Years of Education: N/A   Occupational History  . Technical sales engineer     Social History Main Topics  . Smoking status: Former Smoker    Types: Cigarettes, Cigars    Quit date: 10/04/1987  . Smokeless tobacco: Never Used  . Alcohol Use: 3.5 - 7.0 oz/week    7-14 drink(s) per week     Comment: 2 daily   . Drug Use: No  . Sexual Activity: Not on file   Other Topics Concern  . Not on file   Social History Narrative   1 caffeine drink daily     Current Outpatient Prescriptions on File Prior to Visit  Medication Sig Dispense Refill  . aspirin 81 MG tablet Take 81 mg by mouth daily.    [name] esomeprazole (NEXIUM) 40 MG capsule Take 1 capsule (40 mg total) by mouth daily before breakfast. 90 capsule 1  . LORazepam (ATIVAN) 1 MG tablet Take 1 tablet (1 mg total) by mouth 2 (two) times daily. 60 tablet 1  . tadalafil (CIALIS) 20 MG tablet Take 1 tablet (20 mg total) by mouth daily as needed for erectile dysfunction. 10 tablet 0   No current facility-administered medications on file prior  to visit.    Allergies  Allergen Reactions  . Escitalopram Hives    Pt felt anxious  Pt felt his skin was "on fire"    Review of Systems  Review of Systems  Constitutional: Positive for malaise/fatigue. Negative for fever and chills.  HENT: Positive for congestion and sore throat. Negative for hearing loss and nosebleeds.   Eyes: Negative for discharge.  Respiratory: Positive for cough and sputum production. Negative for shortness of breath and wheezing.   Cardiovascular: Negative for chest pain, palpitations and leg swelling.  Gastrointestinal: Negative for heartburn, nausea, vomiting, abdominal pain, diarrhea, constipation and blood in stool.  Genitourinary: Negative for dysuria, urgency, frequency and hematuria.  Musculoskeletal: Positive for myalgias. Negative for back pain and falls.  Skin: Negative for rash.  Neurological: Negative for dizziness, tremors, sensory  change, focal weakness, loss of consciousness, weakness and headaches.  Endo/Heme/Allergies: Negative for polydipsia. Does not bruise/bleed easily.  Psychiatric/Behavioral: Negative for depression and suicidal ideas. The patient is not nervous/anxious and does not have insomnia.     Objective  BP 124/83 mmHg  Pulse 86  Temp(Src) 98.1 F (36.7 C) (Oral)  Ht 5\' 5"  (1.651 m)  Wt 156 lb 6.4 oz (70.943 kg)  BMI 26.03 kg/m2  SpO2 98%  Physical Exam  Physical Exam  Constitutional: He is oriented to person, place, and time and well-developed, well-nourished, and in no distress. No distress.  HENT:  Head: Normocephalic and atraumatic.  Eyes: Conjunctivae are normal.  Neck: Neck supple. No thyromegaly present.  Cardiovascular: Normal rate, regular rhythm and normal heart sounds.   No murmur heard. Pulmonary/Chest: Effort normal and breath sounds normal. No respiratory distress.  Abdominal: He exhibits no distension and no mass. There is no tenderness.  Musculoskeletal: He exhibits no edema.  Neurological: He is alert and oriented to person, place, and time.  Skin: Skin is warm.  Psychiatric: Memory, affect and judgment normal.    Lab Results  Component Value Date   TSH 1.79 10/17/2014   Lab Results  Component Value Date   WBC 5.0 10/17/2014   HGB 15.0 10/17/2014   HCT 44.2 10/17/2014   MCV 91.9 10/17/2014   PLT 299.0 10/17/2014   Lab Results  Component Value Date   CREATININE 1.03 10/17/2014   BUN 15 10/17/2014   NA 142 10/17/2014   K 5.1 10/17/2014   CL 105 10/17/2014   CO2 23 10/17/2014   Lab Results  Component Value Date   ALT 19 10/17/2014   AST 24 10/17/2014   ALKPHOS 47 10/17/2014   BILITOT 0.4 10/17/2014   No results found for: CHOL Lab Results  Component Value Date   HDL 48 10/17/2014   No results found for: [facility] Lab Results  Component Value Date   TRIG 134 10/17/2014   Lab Results  Component Value Date   CHOLHDL 4.3 10/17/2014      Assessment & Plan  Esophageal reflux Avoid offending foods, start probiotics. Do not eat large meals in late evening and consider raising head of bed.    Acute bronchitis Encouraged increased rest and hydration, add probiotics, zinc such as Coldeze or Xicam. Treat fevers as needed. Mucinex bid and is given an rx for Azithromycin to use only if symptoms worsen or do not resolve

## 2015-01-02 NOTE — Progress Notes (Signed)
Pre visit review using our clinic review tool, if applicable. No additional management support is needed unless otherwise documented below in the visit note. 

## 2015-01-07 DIAGNOSIS — J209 Acute bronchitis, unspecified: Secondary | ICD-10-CM | POA: Insufficient documentation

## 2015-01-07 NOTE — Assessment & Plan Note (Signed)
Avoid offending foods, start probiotics. Do not eat large meals in late evening and consider raising head of bed.  

## 2015-01-07 NOTE — Assessment & Plan Note (Signed)
Encouraged increased rest and hydration, add probiotics, zinc such as Coldeze or Xicam. Treat fevers as needed. Mucinex bid and is given an rx for Azithromycin to use only if symptoms worsen or do not resolve

## 2015-01-09 ENCOUNTER — Ambulatory Visit: Payer: 59

## 2015-01-09 DIAGNOSIS — M545 Low back pain: Secondary | ICD-10-CM | POA: Diagnosis not present

## 2015-01-24 ENCOUNTER — Telehealth: Payer: Self-pay | Admitting: Physician Assistant

## 2015-01-24 MED ORDER — PANTOPRAZOLE SODIUM 20 MG PO TBEC: 20.0000 mg | DELAYED_RELEASE_TABLET | Freq: Every day | ORAL | Status: DC

## 2015-01-24 NOTE — Telephone Encounter (Signed)
I would recommend Protonix. If he is ok with that, send in 30-day supply of Protonix 20 mg with 2 refills and have him follow-up in 1 month.

## 2015-01-24 NOTE — Telephone Encounter (Signed)
Rx request to pharmacy; Patient informed, understood & agreed/SLS

## 2015-01-24 NOTE — Telephone Encounter (Signed)
Caller name: Petar Relation to pt: self Call back [place]: [facility]   Reason for call:   Patient states that his new insurance company does not cover nexium. States that they will cover omeprazole or pantoprazole. Patient wants to know which one cody recommends

## 2015-01-30 ENCOUNTER — Encounter: Payer: Self-pay | Admitting: Physician Assistant

## 2015-01-30 ENCOUNTER — Ambulatory Visit (INDEPENDENT_AMBULATORY_CARE_PROVIDER_SITE_OTHER): Payer: 59 | Admitting: Physician Assistant

## 2015-01-30 VITALS — BP 137/75 | HR 69 | Temp 98.1°F | Resp 16 | Ht 65.0 in | Wt 157.5 lb

## 2015-01-30 DIAGNOSIS — M62830 Muscle spasm of back: Secondary | ICD-10-CM

## 2015-01-30 DIAGNOSIS — M545 Low back pain, unspecified: Secondary | ICD-10-CM | POA: Insufficient documentation

## 2015-01-30 DIAGNOSIS — M5440 Lumbago with sciatica, unspecified side: Secondary | ICD-10-CM

## 2015-01-30 MED ORDER — CYCLOBENZAPRINE HCL 10 MG PO TABS: 10.0000 mg | ORAL_TABLET | Freq: Every day | ORAL | Status: DC

## 2015-01-30 NOTE — Progress Notes (Signed)
Patient presents to clinic today for follow-up of low back pain with muscle spasms.  Patient has gone through PT with some initial improvement but symptoms continue intermittently.  Now with sharp pain radiating into legs bilaterally with some tingling  In lower extremities.  Denies numbness or weakness.  Denies saddle anesthesia.  Denies change to bowel/bladder habits.  Past Medical History  Diagnosis Date  . Diverticulosis of colon (without mention of hemorrhage)   . Esophageal reflux   . Other symptoms involving digestive system(787.99)   . Pain in joint, shoulder region   . Abdominal pain, right lower quadrant   . Backache, unspecified   . Acute bronchospasm   . External hemorrhoids without mention of complication     thrombosed  . Inguinal hernia without mention of obstruction or gangrene, unilateral or unspecified, (not specified as recurrent)   . Pain in joint, forearm   . Thoracic or lumbosacral neuritis or radiculitis, unspecified   . Obstructive sleep apnea (adult) (pediatric)   . Hiatal hernia   . Migraine   . Chicken pox   . Seasonal allergies     Current Outpatient Prescriptions on File Prior to Visit  Medication Sig Dispense Refill  . aspirin 81 MG tablet Take 81 mg by mouth daily.    [name] LORazepam (ATIVAN) 1 MG tablet Take 1 tablet (1 mg total) by mouth 2 (two) times daily. 60 tablet 1  . pantoprazole (PROTONIX) 20 MG tablet Take 1 tablet (20 mg total) by mouth daily. 30 tablet 2  . tadalafil (CIALIS) 20 MG tablet Take 1 tablet (20 mg total) by mouth daily as needed for erectile dysfunction. 10 tablet 0   No current facility-administered medications on file prior to visit.    Allergies  Allergen Reactions  . Escitalopram Hives    Pt felt anxious  Pt felt his skin was "on fire"    Family History  Problem Relation Age of Onset  . Diabetes Maternal Aunt   . Lung cancer Mother 39    Deceased  . Colon cancer Neg Hx   . Other Father     MVA  . Heart  disease Paternal Grandmother   . Heart attack Paternal Grandmother   . Healthy Brother     x2  . Aneurysm Sister     Brain  . Allergies Daughter     x2  . Migraines Daughter     x1    History   Social History  . Marital Status: Married    Spouse Name: N/A  . Number of Children: N/A  . Years of Education: N/A   Occupational History  . Technical sales engineer     Social History Main Topics  . Smoking status: Former Smoker    Types: Cigarettes, Cigars    Quit date: 10/04/1987  . Smokeless tobacco: Never Used  . Alcohol Use: 3.5 - 7.0 oz/week    7-14 drink(s) per week     Comment: 2 daily   . Drug Use: No  . Sexual Activity: Not on file   Other Topics Concern  . None   Social History Narrative   1 caffeine drink daily    Review of Systems - See HPI.  All other ROS are negative.  BP 137/75 mmHg  Pulse 69  Temp(Src) 98.1 F (36.7 C) (Oral)  Resp 16  Ht 5\' 5"  (1.651 m)  Wt 157 lb 8 oz (71.442 kg)  BMI 26.21 kg/m2  SpO2 100%  Physical Exam  Constitutional:  He is oriented to person, place, and time and well-developed, well-nourished, and in no distress.  HENT:  Head: Normocephalic and atraumatic.  Eyes: Conjunctivae are normal. Pupils are equal, round, and reactive to light.  Neck: Neck supple.  Cardiovascular: Normal rate, regular rhythm, normal heart sounds and intact distal pulses.   Pulmonary/Chest: Effort normal and breath sounds normal. No respiratory distress. He has no wheezes. He has no rales. He exhibits no tenderness.  Neurological: He is alert and oriented to person, place, and time. He has normal reflexes. No cranial nerve deficit.  Skin: Skin is warm and dry. No rash noted.  Psychiatric: Affect normal.  Vitals reviewed.    Assessment/Plan: Low back pain with radiation > 3 months duration. Now with radiation and radicular symptoms.  Will proceed with MRI to further assess.  Avoid heavy lifting or overexertion.  Flexeril at bedtime.  Continue OTC pain  medications if needed.  Alarm signs/symptoms discussed with patient.  Will refer to Ortho or Neurosurgery based on MRI results.

## 2015-01-30 NOTE — Progress Notes (Signed)
Pre visit review using our clinic review tool, if applicable. No additional management support is needed unless otherwise documented below in the visit note/SLS  

## 2015-01-30 NOTE — Assessment & Plan Note (Signed)
>   3 months duration. Now with radiation and radicular symptoms.  Will proceed with MRI to further assess.  Avoid heavy lifting or overexertion.  Flexeril at bedtime.  Continue OTC pain medications if needed.  Alarm signs/symptoms discussed with patient.  Will refer to Ortho or Neurosurgery based on MRI results.

## 2015-01-30 NOTE — Patient Instructions (Signed)
Please take Flexeril nightly at bedtime.  Avoid heavy lifting or overexertion.  Continue your stretching exercises.  You will be called to schedule an MRI for further assessment.  This will help [place] find the best treatment for your symptoms.  If at any time you develop severe back pain, numbness in the groin, incontinence or inability to use the bathroom, please go straight to the ER for evaluation.

## 2015-02-03 ENCOUNTER — Ambulatory Visit (HOSPITAL_BASED_OUTPATIENT_CLINIC_OR_DEPARTMENT_OTHER)
Admission: RE | Admit: 2015-02-03 | Discharge: 2015-02-03 | Disposition: A | Discharge status: Home / Self Care | Payer: 59 | Source: Ambulatory Visit | Attending: Physician Assistant | Admitting: Physician Assistant

## 2015-02-03 DIAGNOSIS — M545 Low back pain, unspecified: Secondary | ICD-10-CM

## 2015-02-03 DIAGNOSIS — M5440 Lumbago with sciatica, unspecified side: Secondary | ICD-10-CM | POA: Diagnosis not present

## 2015-02-04 ENCOUNTER — Telehealth: Payer: Self-pay | Admitting: Physician Assistant

## 2015-02-04 DIAGNOSIS — M5116 Intervertebral disc disorders with radiculopathy, lumbar region: Secondary | ICD-10-CM

## 2015-02-04 NOTE — Telephone Encounter (Signed)
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  This is the source of his pain.  I have placed a referral to Neurosurgery for further management.  If pain acutely flares up, he is to call or come see [place] in office.

## 2015-02-05 NOTE — Telephone Encounter (Signed)
Patient informed, understood & agreed/SLS  

## 2015-02-27 ENCOUNTER — Encounter: Payer: Self-pay | Admitting: Physician Assistant

## 2015-02-27 ENCOUNTER — Ambulatory Visit (INDEPENDENT_AMBULATORY_CARE_PROVIDER_SITE_OTHER): Payer: 59 | Admitting: Physician Assistant

## 2015-02-27 VITALS — BP 118/78 | HR 65 | Temp 98.4°F | Resp 16 | Ht 65.0 in | Wt 150.4 lb

## 2015-02-27 DIAGNOSIS — K219 Gastro-esophageal reflux disease without esophagitis: Secondary | ICD-10-CM

## 2015-02-27 DIAGNOSIS — T887XXA Unspecified adverse effect of drug or medicament, initial encounter: Secondary | ICD-10-CM | POA: Insufficient documentation

## 2015-02-27 DIAGNOSIS — T50905A Adverse effect of unspecified drugs, medicaments and biological substances, initial encounter: Secondary | ICD-10-CM

## 2015-02-27 MED ORDER — RANITIDINE HCL 150 MG PO TABS: 150.0000 mg | ORAL_TABLET | Freq: Two times a day (BID) | ORAL | Status: DC

## 2015-02-27 NOTE — Progress Notes (Signed)
Pre visit review using our clinic review tool, if applicable. No additional management support is needed unless otherwise documented below in the visit note/SLS  

## 2015-02-27 NOTE — Assessment & Plan Note (Signed)
We'll discontinue offending agent--Protonix. Discussed timeframe for symptom resolution truly a medication side effect. Encouraged over-the-counter B12 supplement over the next couple of weeks. Follow-up in 2 weeks.

## 2015-02-27 NOTE — Assessment & Plan Note (Signed)
We'll discontinue Protonix. We'll begin Zantac 150 milligrams twice a day after discussing long-term effects of PPI therapy. Discussed with patient. Will follow-up in 2-4 weeks.

## 2015-02-27 NOTE — Progress Notes (Signed)
Patient presents to clinic today c/o intermittent tingling in his fingers, forearms and hands over the past 1-1.5 weeks. Patient states symptoms started after switching from Nexium to Protonix. Denies any other recent medication changes. Denies any recent trauma or injury. Is not a smoker. No history of diabetes or neuropathy.  Past Medical History  Diagnosis Date  . Diverticulosis of colon (without mention of hemorrhage)   . Esophageal reflux   . Other symptoms involving digestive system(787.99)   . Pain in joint, shoulder region   . Abdominal pain, right lower quadrant   . Backache, unspecified   . Acute bronchospasm   . External hemorrhoids without mention of complication     thrombosed  . Inguinal hernia without mention of obstruction or gangrene, unilateral or unspecified, (not specified as recurrent)   . Pain in joint, forearm   . Thoracic or lumbosacral neuritis or radiculitis, unspecified   . Obstructive sleep apnea (adult) (pediatric)   . Hiatal hernia   . Migraine   . Chicken pox   . Seasonal allergies     Current Outpatient Prescriptions on File Prior to Visit  Medication Sig Dispense Refill  . aspirin 81 MG tablet Take 81 mg by mouth daily.    [name] LORazepam (ATIVAN) 1 MG tablet Take 1 tablet (1 mg total) by mouth 2 (two) times daily. 60 tablet 1  . tadalafil (CIALIS) 20 MG tablet Take 1 tablet (20 mg total) by mouth daily as needed for erectile dysfunction. 10 tablet 0   No current facility-administered medications on file prior to visit.    Allergies  Allergen Reactions  . Escitalopram Hives    Pt felt anxious  Pt felt his skin was "on fire"    Family History  Problem Relation Age of Onset  . Diabetes Maternal Aunt   . Lung cancer Mother 51    Deceased  . Colon cancer Neg Hx   . Other Father     MVA  . Heart disease Paternal Grandmother   . Heart attack Paternal Grandmother   . Healthy Brother     x2  . Aneurysm Sister     Brain  . Allergies  Daughter     x2  . Migraines Daughter     x1    History   Social History  . Marital Status: Married    Spouse Name: N/A  . Number of Children: N/A  . Years of Education: N/A   Occupational History  . Technical sales engineer     Social History Main Topics  . Smoking status: Former Smoker    Types: Cigarettes, Cigars    Quit date: 10/04/1987  . Smokeless tobacco: Never Used  . Alcohol Use: 3.5 - 7.0 oz/week    7-14 drink(s) per week     Comment: 2 daily   . Drug Use: No  . Sexual Activity: Not on file   Other Topics Concern  . None   Social History Narrative   1 caffeine drink daily    Review of Systems - See HPI.  All other ROS are negative.  BP 118/78 mmHg  Pulse 65  Temp(Src) 98.4 F (36.9 C) (Oral)  Resp 16  Ht 5\' 5"  (1.651 m)  Wt 150 lb 6 oz (68.21 kg)  BMI 25.02 kg/m2  SpO2 100%  Physical Exam  Constitutional: He is oriented to person, place, and time and well-developed, well-nourished, and in no distress.  HENT:  Head: Normocephalic and atraumatic.  Eyes: Conjunctivae are normal.  Cardiovascular: Normal rate, regular rhythm, normal heart sounds and intact distal pulses.   Pulmonary/Chest: Effort normal and breath sounds normal. No respiratory distress. He has no wheezes. He has no rales. He exhibits no tenderness.  Neurological: He is alert and oriented to person, place, and time.  Skin: Skin is warm and dry. No rash noted.  Psychiatric: Affect normal.  Vitals reviewed.   No results found for this or any previous visit (from the past 2160 hour(s)).  Assessment/Plan: Esophageal reflux We'll discontinue Protonix. We'll begin Zantac 150 milligrams twice a day after discussing long-term effects of PPI therapy. Discussed with patient. Will follow-up in 2-4 weeks.    Medication side effect We'll discontinue offending agent--Protonix. Discussed timeframe for symptom resolution truly a medication side effect. Encouraged over-the-counter B12 supplement over  the next couple of weeks. Follow-up in 2 weeks.

## 2015-02-27 NOTE — Patient Instructions (Signed)
Please stop the Protonix. Take the Zantac twice daily as directed. Start a 500 mcg B12 supplement daily.  Follow-up in 2 weeks.

## 2015-04-06 ENCOUNTER — Encounter: Payer: Self-pay | Admitting: Family Medicine

## 2015-04-06 ENCOUNTER — Ambulatory Visit (INDEPENDENT_AMBULATORY_CARE_PROVIDER_SITE_OTHER): Payer: 59 | Admitting: Family Medicine

## 2015-04-06 VITALS — BP 115/83 | HR 69 | Temp 98.5°F | Resp 16 | Ht 65.0 in | Wt 150.0 lb

## 2015-04-06 DIAGNOSIS — R42 Dizziness and giddiness: Secondary | ICD-10-CM

## 2015-04-06 DIAGNOSIS — Z8249 Family history of ischemic heart disease and other diseases of the circulatory system: Secondary | ICD-10-CM | POA: Diagnosis not present

## 2015-04-06 DIAGNOSIS — M545 Low back pain: Secondary | ICD-10-CM | POA: Diagnosis not present

## 2015-04-06 DIAGNOSIS — K219 Gastro-esophageal reflux disease without esophagitis: Secondary | ICD-10-CM

## 2015-04-06 DIAGNOSIS — G8929 Other chronic pain: Secondary | ICD-10-CM

## 2015-04-06 DIAGNOSIS — F419 Anxiety disorder, unspecified: Secondary | ICD-10-CM

## 2015-04-06 LAB — CBC
HCT: 44.3 % (ref 39.0–52.0)
Hemoglobin: 15.1 g/dL (ref 13.0–17.0)
MCHC: 34.2 g/dL (ref 30.0–36.0)
MCV: 91.3 fl (ref 78.0–100.0)
Platelets: 314 10*3/uL (ref 150.0–400.0)
RBC: 4.85 Mil/uL (ref 4.22–5.81)
RDW: 14.4 % (ref 11.5–15.5)
WBC: 5.5 10*3/uL (ref 4.0–10.5)

## 2015-04-06 LAB — COMPREHENSIVE METABOLIC PANEL
ALT: 17 U/L (ref 0–53)
AST: 22 U/L (ref 0–37)
Albumin: 4.4 g/dL (ref 3.5–5.2)
Alkaline Phosphatase: 54 U/L (ref 39–117)
BUN: 14 mg/dL (ref 6–23)
CO2: 31 mEq/L (ref 19–32)
Calcium: 9.6 mg/dL (ref 8.4–10.5)
Chloride: 103 mEq/L (ref 96–112)
Creatinine, Ser: 1.07 mg/dL (ref 0.40–1.50)
GFR: 76.29 mL/min (ref >60.00)
Glucose, Bld: 85 mg/dL (ref 70–99)
Potassium: 4.2 mEq/L (ref 3.5–5.1)
Sodium: 136 mEq/L (ref 135–145)
Total Bilirubin: 0.4 mg/dL (ref 0.2–1.2)
Total Protein: 7.6 g/dL (ref 6.0–8.3)

## 2015-04-06 LAB — SEDIMENTATION RATE: Sed Rate: 5 mm/hr (ref 0–22)

## 2015-04-06 MED ORDER — METHYLPREDNISOLONE 4 MG PO TBPK: ORAL_TABLET | ORAL | Status: DC

## 2015-04-06 NOTE — Progress Notes (Signed)
Pre visit review using our clinic review tool, if applicable. No additional management support is needed unless otherwise documented below in the visit note. 

## 2015-04-06 NOTE — Patient Instructions (Signed)

## 2015-04-06 NOTE — Progress Notes (Signed)
Curtis Luna  [id number] 06/30/1961 04/06/2015      Progress Note-Follow Up  Subjective  Chief Complaint  Chief Complaint  Patient presents with  . Dizziness    Has been having vertigo/dizziness for 1.5 years. Has been seen by ENT and nothing was found. Would possibly like to see Neuro    HPI  Patient is a 54 y.o. male in today for routine medical care. Patient is in today to discuss recent flare and vertigo. Has a long history of intermittent vertigo but has been having increased symptoms this week. Has been evaluated by Dr. Meredith Leeds of ENT in the past at [facility]. And testing has been unremarkable. This week he's felt lightheaded with some slight spinning when he turns his head. Has also a long history of tinnitus right worse than left but this is not worsening. He reports having an MRI of his brain 8 years ago which she describes as normal. He has a history of a sister diagnosed with a brain aneurysm and more recently a brother has also been diagnosed so he finds himself anxious. No recent falls or injury. No hearing loss. No fevers or chills. No headache. Continues to struggle with anxiety and back pain but these are not new symptoms. Denies CP/palp/SOB/HA/congestion/fevers/GI or GU c/o. Taking meds as prescribed  Past Medical History  Diagnosis Date  . Diverticulosis of colon (without mention of hemorrhage)   . Esophageal reflux   . Other symptoms involving digestive system(787.99)   . Pain in joint, shoulder region   . Abdominal pain, right lower quadrant   . Backache, unspecified   . Acute bronchospasm   . External hemorrhoids without mention of complication     thrombosed  . Inguinal hernia without mention of obstruction or gangrene, unilateral or unspecified, (not specified as recurrent)   . Pain in joint, forearm   . Thoracic or lumbosacral neuritis or radiculitis, unspecified   . Obstructive sleep apnea (adult) (pediatric)   . Hiatal hernia   . Migraine   . Chicken  pox   . Seasonal allergies     Past Surgical History  Procedure Laterality Date  . Colon resection  10/06    sigmoid colon  . Wisdom tooth extraction    . Inguinal hernia repair  12/2012    Left    Family History  Problem Relation Age of Onset  . Diabetes Maternal Aunt   . Lung cancer Mother 15    Deceased  . Colon cancer Neg Hx   . Other Father     MVA  . Heart disease Paternal Grandmother   . Heart attack Paternal Grandmother   . Healthy Brother     x2  . Aneurysm Sister     Brain  . Allergies Daughter     x2  . Migraines Daughter     x1    History   Social History  . Marital Status: Married    Spouse Name: N/A  . Number of Children: N/A  . Years of Education: N/A   Occupational History  . Technical sales engineer     Social History Main Topics  . Smoking status: Former Smoker    Types: Cigarettes, Cigars    Quit date: 10/04/1987  . Smokeless tobacco: Never Used  . Alcohol Use: 3.5 - 7.0 oz/week    7-14 drink(s) per week     Comment: 2 daily   . Drug Use: No  . Sexual Activity: Not on file   Other Topics Concern  .  Not on file   Social History Narrative   1 caffeine drink daily     Current Outpatient Prescriptions on File Prior to Visit  Medication Sig Dispense Refill  . aspirin 81 MG tablet Take 81 mg by mouth daily.    [name] LORazepam (ATIVAN) 1 MG tablet Take 1 tablet (1 mg total) by mouth 2 (two) times daily. 60 tablet 1  . ranitidine (ZANTAC) 150 MG tablet Take 1 tablet (150 mg total) by mouth 2 (two) times daily. 60 tablet 3  . tadalafil (CIALIS) 20 MG tablet Take 1 tablet (20 mg total) by mouth daily as needed for erectile dysfunction. 10 tablet 0   No current facility-administered medications on file prior to visit.    Allergies  Allergen Reactions  . Escitalopram Hives    Pt felt anxious  Pt felt his skin was "on fire"  . Protonix [Pantoprazole Sodium] Other (See Comments)    Tingling of extremities    Review of Systems  Review of  Systems  Constitutional: Negative for fever, chills and malaise/fatigue.  HENT: Negative for congestion, hearing loss and nosebleeds.   Eyes: Negative for discharge.  Respiratory: Negative for cough, sputum production, shortness of breath and wheezing.   Cardiovascular: Negative for chest pain, palpitations and leg swelling.  Gastrointestinal: Negative for heartburn, nausea, vomiting, abdominal pain, diarrhea, constipation and blood in stool.  Genitourinary: Negative for dysuria, urgency, frequency and hematuria.  Musculoskeletal: Positive for back pain. Negative for myalgias and falls.  Skin: Negative for rash.  Neurological: Positive for dizziness. Negative for tremors, sensory change, focal weakness, loss of consciousness, weakness and headaches.  Endo/Heme/Allergies: Negative for polydipsia. Does not bruise/bleed easily.  Psychiatric/Behavioral: Negative for depression and suicidal ideas. The patient is not nervous/anxious and does not have insomnia.     Objective  BP 115/83 mmHg  Pulse 69  Temp(Src) 98.5 F (36.9 C) (Oral)  Resp 16  Ht 5\' 5"  (1.651 m)  Wt 150 lb (68.04 kg)  BMI 24.96 kg/m2  SpO2 100%  Physical Exam  Physical Exam  Constitutional: He is oriented to person, place, and time and well-developed, well-nourished, and in no distress. No distress.  HENT:  Head: Normocephalic and atraumatic.  Eyes: Conjunctivae are normal.  Neck: Neck supple. No thyromegaly present.  Cardiovascular: Normal rate, regular rhythm and normal heart sounds.   No murmur heard. Pulmonary/Chest: Effort normal and breath sounds normal. No respiratory distress.  Abdominal: He exhibits no distension and no mass. There is no tenderness.  Musculoskeletal: He exhibits no edema.  Neurological: He is alert and oriented to person, place, and time. He has normal reflexes. He displays normal reflexes. No cranial nerve deficit. Gait normal. Coordination normal. GCS score is 15.  Skin: Skin is warm.    Psychiatric: Memory, affect and judgment normal.    Lab Results  Component Value Date   TSH 1.79 10/17/2014   Lab Results  Component Value Date   WBC 5.0 10/17/2014   HGB 15.0 10/17/2014   HCT 44.2 10/17/2014   MCV 91.9 10/17/2014   PLT 299.0 10/17/2014   Lab Results  Component Value Date   CREATININE 1.03 10/17/2014   BUN 15 10/17/2014   NA 142 10/17/2014   K 5.1 10/17/2014   CL 105 10/17/2014   CO2 23 10/17/2014   Lab Results  Component Value Date   ALT 19 10/17/2014   AST 24 10/17/2014   ALKPHOS 47 10/17/2014   BILITOT 0.4 10/17/2014   No results found for: CHOL  Lab Results  Component Value Date   HDL 48 10/17/2014   No results found for: [facility] Lab Results  Component Value Date   TRIG 134 10/17/2014   Lab Results  Component Value Date   CHOLHDL 4.3 10/17/2014     Assessment & Plan  Esophageal reflux Avoid offending foods, start probiotics. Do not eat large meals in late evening and consider raising head of bed.    Vertigo Patient notes a long history of intermittent vertigo but has recently had a flare and has had several family members now diagnosed with brain aneurysms. His brother most recently. Sister also diagnosed. MRI is ordered for patient and is negative for any aneurysm or concerning pathology. Encouraged to increase hydration and to maintain small, frequent meals with lean proteins, report if symptoms worsen. Has seen ENT, Dr Scotty Court at [facility] in past. Is given a medrol dosepak and if symptoms do not resolve then will refer back to ENT and possibly neuro for further consideration   Chronic low back pain Encouraged moist heat and gentle stretching as tolerated. May try NSAIDs and prescription meds as directed and report if symptoms worsen or seek immediate care   Anxiety Has used Lorazepam as needed sparingly, may continue the same

## 2015-04-14 ENCOUNTER — Ambulatory Visit (HOSPITAL_BASED_OUTPATIENT_CLINIC_OR_DEPARTMENT_OTHER)
Admission: RE | Admit: 2015-04-14 | Discharge: 2015-04-14 | Disposition: A | Discharge status: Home / Self Care | Payer: 59 | Source: Ambulatory Visit | Attending: Family Medicine | Admitting: Family Medicine

## 2015-04-14 DIAGNOSIS — R42 Dizziness and giddiness: Secondary | ICD-10-CM | POA: Insufficient documentation

## 2015-04-14 DIAGNOSIS — R269 Unspecified abnormalities of gait and mobility: Secondary | ICD-10-CM | POA: Insufficient documentation

## 2015-04-14 DIAGNOSIS — Z8249 Family history of ischemic heart disease and other diseases of the circulatory system: Secondary | ICD-10-CM

## 2015-04-15 DIAGNOSIS — R42 Dizziness and giddiness: Secondary | ICD-10-CM | POA: Insufficient documentation

## 2015-04-15 DIAGNOSIS — Z8249 Family history of ischemic heart disease and other diseases of the circulatory system: Secondary | ICD-10-CM | POA: Insufficient documentation

## 2015-04-15 NOTE — Assessment & Plan Note (Signed)
Encouraged moist heat and gentle stretching as tolerated. May try NSAIDs and prescription meds as directed and report if symptoms worsen or seek immediate care 

## 2015-04-15 NOTE — Assessment & Plan Note (Addendum)
Patient notes a long history of intermittent vertigo but has recently had a flare and has had several family members now diagnosed with brain aneurysms. His brother most recently. Sister also diagnosed. MRI is ordered for patient and is negative for any aneurysm or concerning pathology. Encouraged to increase hydration and to maintain small, frequent meals with lean proteins, report if symptoms worsen. Has seen ENT, Dr Scotty Court at [facility] in past. Is given a medrol dosepak and if symptoms do not resolve then will refer back to ENT and possibly neuro for further consideration

## 2015-04-15 NOTE — Assessment & Plan Note (Signed)
Has used Lorazepam as needed sparingly, may continue the same

## 2015-04-15 NOTE — Assessment & Plan Note (Signed)
Avoid offending foods, start probiotics. Do not eat large meals in late evening and consider raising head of bed.  

## 2015-04-18 ENCOUNTER — Encounter: Payer: Self-pay | Admitting: Physician Assistant

## 2015-04-18 ENCOUNTER — Ambulatory Visit (INDEPENDENT_AMBULATORY_CARE_PROVIDER_SITE_OTHER): Payer: 59 | Admitting: Physician Assistant

## 2015-04-18 VITALS — BP 131/80 | HR 82 | Temp 98.0°F | Wt 144.0 lb

## 2015-04-18 DIAGNOSIS — G629 Polyneuropathy, unspecified: Secondary | ICD-10-CM

## 2015-04-18 MED ORDER — GABAPENTIN 100 MG PO CAPS: ORAL_CAPSULE | ORAL | Status: DC

## 2015-04-18 NOTE — Progress Notes (Signed)
Pre visit review using our clinic review tool, if applicable. No additional management support is needed unless otherwise documented below in the visit note. 

## 2015-04-18 NOTE — Assessment & Plan Note (Signed)
Recent labs all looked good including CBC and BMP.  No hx of diabetes.  Will check b12 level. Will start Gabapentin titrating to 100 mg TID.  Follow-up with Neurology as scheduled.  Follow-up here in 3-4 weeks.

## 2015-04-18 NOTE — Progress Notes (Signed)
Patient presents to clinic today c/o pins and needles sensation in feet bilaterally.  Patient with hx of low B12, but states has been fine previously.  Denies trauma or injury to lower extremities or back.  Is seeing neurosurgery for lumbar herniated discs, but denies back pain or pain in hips down to legs. Denies numbness, weakness of lower extremities.  Past Medical History  Diagnosis Date  . Diverticulosis of colon (without mention of hemorrhage)   . Esophageal reflux   . Other symptoms involving digestive system(787.99)   . Pain in joint, shoulder region   . Abdominal pain, right lower quadrant   . Backache, unspecified   . Acute bronchospasm   . External hemorrhoids without mention of complication     thrombosed  . Inguinal hernia without mention of obstruction or gangrene, unilateral or unspecified, (not specified as recurrent)   . Pain in joint, forearm   . Thoracic or lumbosacral neuritis or radiculitis, unspecified   . Obstructive sleep apnea (adult) (pediatric)   . Hiatal hernia   . Migraine   . Chicken pox   . Seasonal allergies     Current Outpatient Prescriptions on File Prior to Visit  Medication Sig Dispense Refill  . aspirin 81 MG tablet Take 81 mg by mouth daily.    . methylPREDNISolone (MEDROL DOSEPAK) 4 MG TBPK tablet USE AS DIRECTED ON DOSEPAK 21 tablet 0  . ranitidine (ZANTAC) 150 MG tablet Take 1 tablet (150 mg total) by mouth 2 (two) times daily. 60 tablet 3  . tadalafil (CIALIS) 20 MG tablet Take 1 tablet (20 mg total) by mouth daily as needed for erectile dysfunction. 10 tablet 0  . LORazepam (ATIVAN) 1 MG tablet Take 1 tablet (1 mg total) by mouth 2 (two) times daily. (Patient not taking: Reported on 04/18/2015) 60 tablet 1   No current facility-administered medications on file prior to visit.    Allergies  Allergen Reactions  . Escitalopram Hives    Pt felt anxious  Pt felt his skin was "on fire"  . Protonix [Pantoprazole Sodium] Other (See  Comments)    Tingling of extremities    Family History  Problem Relation Age of Onset  . Diabetes Maternal Aunt   . Lung cancer Mother 58    Deceased  . Colon cancer Neg Hx   . Other Father     MVA  . Heart disease Paternal Grandmother   . Heart attack Paternal Grandmother   . Healthy Brother     x2  . Aneurysm Sister     Brain  . Allergies Daughter     x2  . Migraines Daughter     x1    History   Social History  . Marital Status: Married    Spouse Name: N/A  . Number of Children: N/A  . Years of Education: N/A   Occupational History  . Technical sales engineer     Social History Main Topics  . Smoking status: Former Smoker    Types: Cigarettes, Cigars    Quit date: 10/04/1987  . Smokeless tobacco: Never Used  . Alcohol Use: 3.5 - 7.0 oz/week    7-14 drink(s) per week     Comment: 2 daily   . Drug Use: No  . Sexual Activity: Not on file   Other Topics Concern  . None   Social History Narrative   1 caffeine drink daily    Review of Systems - See HPI.  All other ROS are negative.  BP 131/80 mmHg  Pulse 82  Temp(Src) 98 F (36.7 C)  Wt 144 lb (65.318 kg)  SpO2 97%  Physical Exam  Constitutional: He is oriented to person, place, and time and well-developed, well-nourished, and in no distress.  HENT:  Head: Normocephalic and atraumatic.  Eyes: Conjunctivae are normal.  Neck: Neck supple.  Cardiovascular: Normal rate, regular rhythm, normal heart sounds and intact distal pulses.   Pulmonary/Chest: Effort normal and breath sounds normal. No respiratory distress. He has no wheezes. He has no rales. He exhibits no tenderness.  Neurological: He is alert and oriented to person, place, and time. He has normal reflexes. No cranial nerve deficit.  Skin: Skin is warm and dry. No rash noted.  Psychiatric: Affect normal.  Vitals reviewed.   Recent Results (from the past 2160 hour(s))  CBC     Status: None   Collection Time: 04/06/15  9:51 AM  Result Value Ref  Range   WBC 5.5 4.0 - 10.5 K/uL   RBC 4.85 4.22 - 5.81 Mil/uL   Platelets 314.0 150.0 - 400.0 K/uL   Hemoglobin 15.1 13.0 - 17.0 g/dL   HCT 44.3 39.0 - 52.0 %   MCV 91.3 78.0 - 100.0 fl   MCHC 34.2 30.0 - 36.0 g/dL   RDW 14.4 11.5 - 15.5 %  Comp Met (CMET)     Status: None   Collection Time: 04/06/15  9:51 AM  Result Value Ref Range   Sodium 136 135 - 145 mEq/L   Potassium 4.2 3.5 - 5.1 mEq/L   Chloride 103 96 - 112 mEq/L   CO2 31 19 - 32 mEq/L   Glucose, Bld 85 70 - 99 mg/dL   BUN 14 6 - 23 mg/dL   Creatinine, Ser 1.07 0.40 - 1.50 mg/dL   Total Bilirubin 0.4 0.2 - 1.2 mg/dL   Alkaline Phosphatase 54 39 - 117 U/L   AST 22 0 - 37 U/L   ALT 17 0 - 53 U/L   Total Protein 7.6 6.0 - 8.3 g/dL   Albumin 4.4 3.5 - 5.2 g/dL   Calcium 9.6 8.4 - 10.5 mg/dL   GFR 76.29 >60.00 mL/min  Sed Rate (ESR)     Status: None   Collection Time: 04/06/15  9:51 AM  Result Value Ref Range   Sed Rate 5 0 - 22 mm/hr    Assessment/Plan: Peripheral neuropathy Recent labs all looked good including CBC and BMP.  No hx of diabetes.  Will check b12 level. Will start Gabapentin titrating to 100 mg TID.  Follow-up with Neurology as scheduled.  Follow-up here in 3-4 weeks.

## 2015-04-18 NOTE — Patient Instructions (Signed)
Please go to lab for blood work. I will call you with your results. Start the Gabapentin, taking as directed. Follow-up in 3-4 week. Follow-up with Neurology as scheduled.

## 2015-04-19 ENCOUNTER — Ambulatory Visit: Payer: Self-pay | Admitting: Family Medicine

## 2015-04-19 LAB — VITAMIN B12: Vitamin B-12: 603 pg/mL (ref 211–911)

## 2015-04-23 ENCOUNTER — Encounter: Payer: Self-pay | Admitting: Neurology

## 2015-04-23 ENCOUNTER — Ambulatory Visit (INDEPENDENT_AMBULATORY_CARE_PROVIDER_SITE_OTHER): Payer: 59 | Admitting: Neurology

## 2015-04-23 VITALS — BP 110/80 | HR 86 | Ht 65.0 in | Wt 143.2 lb

## 2015-04-23 DIAGNOSIS — R42 Dizziness and giddiness: Secondary | ICD-10-CM | POA: Diagnosis not present

## 2015-04-23 DIAGNOSIS — R202 Paresthesia of skin: Secondary | ICD-10-CM | POA: Diagnosis not present

## 2015-04-23 NOTE — Patient Instructions (Addendum)
1.  Increase the gabapentin as prescribed (take 1 in AM and 1 at night next) 2.  We will check blood work for other causes of neuropathy (SPEP/IFE, ANA, CRP, Lyme). Your provider has requested that you have labwork completed today. Please go to [facility] on the first floor of this building before leaving the office today. 3.  We will schedule you for nerve conduction study test. We will call you with the appointment.  4.  Follow up after nerve study test.

## 2015-04-23 NOTE — Progress Notes (Signed)
NEUROLOGY CONSULTATION NOTE  Curtis Luna MRN: [id number] DOB: 1961-09-10  Referring provider: Penni Homans, MD Primary care provider: Leeanne Rio, PA-C  Reason for consult:  Vertigo, paresthesias.  HISTORY OF PRESENT ILLNESS: Curtis Luna is a 54 year old right-handed man with migraines and back pain who presents for vertigo and paresthesias.  Records, labs and MRA of head reviewed.  For about 2 years, he has experienced episodic vertigo which correlates with BPPV.  When it gets severe, he uses a modified Epley maneuver, which helps.  However, about 1 and a half years ago, he has developed a chronic "swimmy" feeling  in his head.  It seems to be exacerbated with any movement of his head, such as scanning the food isles in the grocery store or cooking in the kitchen where he has to keep turning back and forth from the stove or refrigerator to the counter.  He also notices it if he turns over in bed from right to left.  Also, he notices it when he is walking.  He has had testing by ENT, which has been unremarkable.  He reportedly had an MRI of the brain which was unremarkable.  He was given a medication by the ENT, possibly Diamox, which was ineffective.  He saw another neurologist, who prescribed Lexapro, however he had an allergic reaction to this.    He reports that both his brother and sister have been diagnosed with cerebral aneurysm, so MRA of the head was performed on 04/14/15, which was normal, without evidence of aneurysm or vertebro-basilar stenosis.  For the past 6 weeks, he has noted numbness and tingling involving the ankles and ventral aspect of his wrist and forearms.  It started after he switched from Nexium to Protonix.  He stopped the Protonix and started taking 50 mcg pill of B12, which helped.  However the symptoms returned.  His B12 level from 04/18/15 was 603.  He was started on gabapentin 100mg  at bedtime, which helped for 5 days but then it has started to return.  He  visits [place], [place] he is originally from, in the summer where he has been exposed to ticks, but not recently.  He denies any rash or arthralgias.  Recent blood work includes Sed Rate of 5.  Labs from November include Hgb A1c of 5.4 and TSH of 1.79.  He denies dry eyes, dry mouth, lightheadedness, abnormal sweating, or GI symptoms such as early satiety, diarrhea or constipation.  He also has ocular migraines, where he develops kaleidoscope vision in his periphery, lasting 20 to 60 minutes and without headache.  It occurs about every 3 to 4 months.  PAST MEDICAL HISTORY: Past Medical History  Diagnosis Date  . Diverticulosis of colon (without mention of hemorrhage)   . Esophageal reflux   . Other symptoms involving digestive system(787.99)   . Pain in joint, shoulder region   . Abdominal pain, right lower quadrant   . Backache, unspecified   . Acute bronchospasm   . External hemorrhoids without mention of complication     thrombosed  . Inguinal hernia without mention of obstruction or gangrene, unilateral or unspecified, (not specified as recurrent)   . Pain in joint, forearm   . Thoracic or lumbosacral neuritis or radiculitis, unspecified   . Obstructive sleep apnea (adult) (pediatric)   . Hiatal hernia   . Migraine   . Chicken pox   . Seasonal allergies     PAST SURGICAL HISTORY: Past Surgical History  Procedure Laterality  Date  . Colon resection  10/06    sigmoid colon  . Wisdom tooth extraction    . Inguinal hernia repair  12/2012    Left    MEDICATIONS: Current Outpatient Prescriptions on File Prior to Visit  Medication Sig Dispense Refill  . aspirin 81 MG tablet Take 81 mg by mouth daily.    [name] gabapentin (NEURONTIN) 100 MG capsule Take 1 tablet by mouth daily for 3 days.  Then increase to 1 tablet BID x 3 days.  Then 1 tablet TID. 90 capsule 3  . LORazepam (ATIVAN) 1 MG tablet Take 1 tablet (1 mg total) by mouth 2 (two) times daily. 60 tablet 1  . ranitidine  (ZANTAC) 150 MG tablet Take 1 tablet (150 mg total) by mouth 2 (two) times daily. 60 tablet 3  . tadalafil (CIALIS) 20 MG tablet Take 1 tablet (20 mg total) by mouth daily as needed for erectile dysfunction. 10 tablet 0   No current facility-administered medications on file prior to visit.    ALLERGIES: Allergies  Allergen Reactions  . Escitalopram Hives    Pt felt anxious  Pt felt his skin was "on fire"  . Protonix [Pantoprazole Sodium] Other (See Comments)    Tingling of extremities    FAMILY HISTORY: Family History  Problem Relation Age of Onset  . Diabetes Maternal Aunt   . Lung cancer Mother 19    Deceased  . Colon cancer Neg Hx   . Other Father     MVA  . Heart disease Paternal Grandmother   . Heart attack Paternal Grandmother   . Healthy Brother     x2  . Aneurysm Sister     Brain  . Allergies Daughter     x2  . Migraines Daughter     x1    SOCIAL HISTORY: History   Social History  . Marital Status: Married    Spouse Name: N/A  . Number of Children: N/A  . Years of Education: N/A   Occupational History  . Technical sales engineer     Social History Main Topics  . Smoking status: Former Smoker    Types: Cigarettes, Cigars    Quit date: 10/04/1987  . Smokeless tobacco: Never Used  . Alcohol Use: 4.2 - 8.4 oz/week    7-14 Standard drinks or equivalent per week     Comment: 2 daily   . Drug Use: No  . Sexual Activity: Not on file   Other Topics Concern  . Not on file   Social History Narrative   1 caffeine drink daily  Lives with wife in a one story home.  Has 2 children.  Works in Estate manager/land agent.    REVIEW OF SYSTEMS: Constitutional: No fevers, chills, or sweats, no generalized fatigue, change in appetite Eyes: No visual changes, double vision, eye pain Ear, nose and throat: No hearing loss, ear pain, nasal congestion, sore throat Cardiovascular: No chest pain, palpitations Respiratory:  No shortness of breath at rest or with exertion,  wheezes GastrointestinaI: No nausea, vomiting, diarrhea, abdominal pain, fecal incontinence Genitourinary:  No dysuria, urinary retention or frequency Musculoskeletal:  No neck pain, back pain Integumentary: No rash, pruritus, skin lesions Neurological: as above Psychiatric: No depression, insomnia, anxiety Endocrine: No palpitations, fatigue, diaphoresis, mood swings, change in appetite, change in weight, increased thirst Hematologic/Lymphatic:  No anemia, purpura, petechiae. Allergic/Immunologic: no itchy/runny eyes, nasal congestion, recent allergic reactions, rashes  PHYSICAL EXAM: Filed Vitals:   04/23/15 1238  BP: 110/80  Pulse:  86   General: No acute distress Head:  Normocephalic/atraumatic Eyes:  fundi unremarkable, without vessel changes, exudates, hemorrhages or papilledema. Neck: supple, no paraspinal tenderness, full range of motion Back: No paraspinal tenderness Heart: regular rate and rhythm Lungs: Clear to auscultation bilaterally. Vascular: No carotid bruits. Neurological Exam: Mental status: alert and oriented to person, place, and time, recent and remote memory intact, fund of knowledge intact, attention and concentration intact, speech fluent and not dysarthric, language intact. Cranial nerves: CN I: not tested CN II: pupils equal, round and reactive to light, visual fields intact, fundi unremarkable, without vessel changes, exudates, hemorrhages or papilledema. CN III, IV, VI:  full range of motion, no nystagmus, no ptosis CN V: facial sensation intact CN VII: upper and lower face symmetric CN VIII: hearing intact CN IX, X: gag intact, uvula midline CN XI: sternocleidomastoid and trapezius muscles intact CN XII: tongue midline Bulk & Tone: normal, no fasciculations. Motor:  5/5 intact Sensation:  Temperature and vibration intact Deep Tendon Reflexes:  2+ throughout, toes downgoing Finger to nose testing:   No dysmetria Heel to shin:  No dysmetria Gait:   Normal station and stride.  Able to turn and walk in tandem. Romberg negative.  IMPRESSION: Vertigo/vestibulopathy.  Etiology unclear.  It may be a chronic condition that he has to live with.  It is not severe enough that he would want to try another medication Paresthesias.  Consider small fiber neuropathy  PLAN: 1.  Will check ANA, CRP, SPEP/IFE and Lyme 2.  Will check NCV-EMG 3.  Titrate up the gabapentin to 100mg  twice daily and then 100mg  three times daily as needed in 3 days 4.  Follow up after NCV  Thank you for allowing me to take part in the care of this patient.  Metta Clines, DO  CC:  Leeanne Rio, [place]  Willette Alma, MD

## 2015-04-24 LAB — B. BURGDORFI ANTIBODIES: B burgdorferi Ab IgG+IgM: 0.04 {ISR}

## 2015-04-24 LAB — ANA: Anti Nuclear Antibody(ANA): NEGATIVE

## 2015-04-25 LAB — UIFE/LIGHT CHAINS/TP QN, 24-HR UR
Albumin, U: DETECTED
Alpha 1, Urine: DETECTED — AB
Alpha 2, Urine: DETECTED — AB
Beta, Urine: DETECTED — AB
Gamma Globulin, Urine: DETECTED — AB
Total Protein, Urine: 10 mg/dL (ref 5–25)

## 2015-04-26 LAB — SPEP & IFE WITH QIG
Albumin ELP: 4.5 g/dL (ref 3.8–4.8)
Alpha-1-Globulin: 0.2 g/dL (ref 0.2–0.3)
Alpha-2-Globulin: 0.6 g/dL (ref 0.5–0.9)
Beta 2: 0.4 g/dL (ref 0.2–0.5)
Beta Globulin: 0.4 g/dL (ref 0.4–0.6)
Gamma Globulin: 1 g/dL (ref 0.8–1.7)
IgA: 253 mg/dL (ref 68–379)
IgG (Immunoglobin G), Serum: 1200 mg/dL (ref 650–1600)
IgM, Serum: 36 mg/dL — ABNORMAL LOW (ref 41–251)
Total Protein, Serum Electrophoresis: 7.1 g/dL (ref 6.1–8.1)

## 2015-04-30 ENCOUNTER — Telehealth: Payer: Self-pay | Admitting: *Deleted

## 2015-04-30 NOTE — Telephone Encounter (Signed)
Patient is aware Labs are unremarkable

## 2015-04-30 NOTE — Telephone Encounter (Signed)
-----   Message from Pieter Partridge, DO sent at 04/26/2015  8:18 PM EDT ----- Labs are unremarkable ----- Message -----    From: Lab in [facility] Interface    Sent: 04/26/2015   1:15 PM      To: Pieter Partridge, DO

## 2015-05-10 ENCOUNTER — Telehealth: Payer: Self-pay | Admitting: Neurology

## 2015-05-10 NOTE — Telephone Encounter (Signed)
Patient is asking if he can increase medication Gabapentin he is at 1 tab TID is was helping at first but is not longer giving him relief please advise

## 2015-05-10 NOTE — Telephone Encounter (Signed)
Pt called and would like to talk to Dr Tomi Likens about the medication(gabapentin) he is taking, if he needs to increase it or not/Dawn

## 2015-05-11 ENCOUNTER — Ambulatory Visit: Payer: 59 | Admitting: Neurology

## 2015-05-11 NOTE — Telephone Encounter (Signed)
Patient will take 200mg  in am 100mg  noon and 200 mg at HS he is scheduled for a office visit 05/31/15 will discuss any more changes with Dr Tomi Likens at this time .

## 2015-05-11 NOTE — Telephone Encounter (Signed)
Yes.  May have to change to the 300 mg capsule so does not have to take so many but Dr. Tomi Likens can do that when gets back if wants to.  For now, increase to 2/1/2 and see if that helps.

## 2015-05-22 ENCOUNTER — Ambulatory Visit (INDEPENDENT_AMBULATORY_CARE_PROVIDER_SITE_OTHER): Payer: 59 | Admitting: Neurology

## 2015-05-22 DIAGNOSIS — R202 Paresthesia of skin: Secondary | ICD-10-CM

## 2015-05-22 DIAGNOSIS — M5417 Radiculopathy, lumbosacral region: Secondary | ICD-10-CM

## 2015-05-22 DIAGNOSIS — G5601 Carpal tunnel syndrome, right upper limb: Secondary | ICD-10-CM

## 2015-05-22 DIAGNOSIS — R42 Dizziness and giddiness: Secondary | ICD-10-CM | POA: Diagnosis not present

## 2015-05-22 NOTE — Procedures (Signed)
[facility]  [address]  [place], [place] [address] Tel: [phone] Fax:  [phone] Test Date:  05/22/2015  Patient: Curtis Luna DOB: 29-Jul-1961 Physician: Narda Amber, DO  Sex: Male Height: 5\' 5"  Ref Phys: Metta Clines, M.D.  ID#: [id number] Temp: 33.0C Technician: Jerilynn Mages. Dean   Patient Complaints: This is a 54 year old gentleman presenting for evaluation of bilateral hand and feet paresthesias, worse on the right.   NCV & EMG Findings: Extensive electrodiagnostic testing of the right upper and lower extremity and additional studies of the left shows:  1. Right median, ulnar, and radial sensory responses are within normal limits. Right palmar sensory responses is abnormal. 2. Right median and ulnar motor responses are within normal limits. 3. Right sural and superficial peroneal sensory responses are within normal limits. 4. Right tibial and peroneal motor responses are within normal limits. 5. Sparse chronic motor axon loss changes are seen affecting bilateral L5 myotomes, without accompanied active denervation to  Impression: 1. Chronic L5 radiculopathy affecting bilateral lower extremities, mild in degree electrically. 2. Right median neuropathy at or distal to the wrist, consistent with the clinical diagnosis of carpal tunnel syndrome; very mild in degree electrically.   ___________________________ Narda Amber, DO    Nerve Conduction Studies Anti Sensory Summary Table   Site NR Peak (ms) Norm Peak (ms) P-T Amp (V) Norm P-T Amp  Right Median Anti Sensory (2nd Digit)  Wrist    2.3 <3.6 32.5 >15  Right Radial Anti Sensory (Base 1st Digit)  Wrist    1.8 <2.7 34.9 >14  Right Sup Peroneal Anti Sensory (Ant Lat Mall)  12 cm    2.6 <4.6 10.6 >4  Right Sural Anti Sensory (Lat Mall)  Calf    3.2 <4.6 19.7 >4  Right Ulnar Anti Sensory (5th Digit)  Wrist    2.6 <3.1 17.9 >10   Motor Summary Table   Site NR Onset (ms) Norm Onset (ms) O-P Amp (mV)  Norm O-P Amp Site1 Site2 Delta-0 (ms) Dist (cm) Vel (m/s) Norm Vel (m/s)  Right Median Motor (Abd Poll Brev)  Wrist    3.0 <4.0 9.0 >6 Elbow Wrist 3.8 23.0 61 >50  Elbow    6.8  8.3         Right Peroneal Motor (Ext Dig Brev)  Ankle    3.8 <6.0 7.8 >2.5 B Fib Ankle 6.0 29.0 48 >40  B Fib    9.8  7.3  Poplt B Fib 2.0 10.0 50 >40  Poplt    11.8  7.2         Right Tibial Motor (Abd Hall Brev)  Ankle    3.4 <6.0 9.0 >4 Knee Ankle 7.6 38.0 50 >40  Knee    11.0  8.3         Right Ulnar Motor (Abd Dig Minimi)  Wrist    2.6 <3.1 8.4 >7 B Elbow Wrist 3.0 17.0 57 >50  B Elbow    5.6  7.6  A Elbow B Elbow 1.5 10.0 67 >50  A Elbow    7.1  7.3          Comparison Summary Table   Site NR Peak (ms) Norm Peak (ms) P-T Amp (V) Site1 Site2 Delta-P (ms) Norm Delta (ms)  Right Median/Ulnar Palm Comparison (Wrist - 8cm)  Median Palm    1.8 <2.2 57.1 Median Palm Ulnar Palm 0.4   Ulnar Palm    1.4 <2.2 19.6  H Reflex Studies   NR H-Lat (ms) Lat Norm (ms) L-R H-Lat (ms)  Left Tibial (Gastroc)     31.29 <35 1.77  Right Tibial (Gastroc)     29.52 <35 1.77   EMG   Side Muscle Ins Act Fibs Psw Fasc Number Recrt Dur Dur. Amp Amp. Poly Poly. Comment  Right AntTibialis Nml Nml Nml Nml 1- Rapid Some 1+ Few 1+ Nml Nml N/A  Right 1stDorInt Nml Nml Nml Nml Nml Nml Nml Nml Nml Nml Nml Nml N/A  Right Gastroc Nml Nml Nml Nml Nml Nml Nml Nml Nml Nml Nml Nml N/A  Right Flex Dig Long Nml Nml Nml Nml 1- Rapid Some 1+ Nml Nml Nml Nml N/A  Right RectFemoris Nml Nml Nml Nml Nml Nml Nml Nml Nml Nml Nml Nml N/A  Right GluteusMed Nml Nml Nml Nml 1- Rapid Some 1+ Some 1+ Nml Nml N/A  Left AntTibialis Nml Nml Nml Nml 1- Rapid Some 1+ Some 1+ Nml Nml N/A  Left GluteusMed Nml Nml Nml Nml 1- Rapid Some 1+ Some 1+ Nml Nml N/A  Right Abd Poll Brev Nml Nml Nml Nml Nml Nml Nml Nml Nml Nml Nml Nml N/A  Right Ext Indicis Nml Nml Nml Nml Nml Nml Nml Nml Nml Nml Nml Nml N/A  Right PronatorTeres Nml Nml Nml Nml Nml Nml Nml Nml  Nml Nml Nml Nml N/A  Right Biceps Nml Nml Nml Nml Nml Nml Nml Nml Nml Nml Nml Nml N/A  Right Triceps Nml Nml Nml Nml Nml Nml Nml Nml Nml Nml Nml Nml N/A  Right Deltoid Nml Nml Nml Nml Nml Nml Nml Nml Nml Nml Nml Nml N/A      Waveforms:

## 2015-05-24 ENCOUNTER — Other Ambulatory Visit: Payer: Self-pay | Admitting: *Deleted

## 2015-05-24 DIAGNOSIS — R42 Dizziness and giddiness: Secondary | ICD-10-CM

## 2015-05-28 ENCOUNTER — Encounter: Payer: Self-pay | Admitting: Neurology

## 2015-05-28 ENCOUNTER — Other Ambulatory Visit (INDEPENDENT_AMBULATORY_CARE_PROVIDER_SITE_OTHER): Payer: 59

## 2015-05-28 ENCOUNTER — Ambulatory Visit (INDEPENDENT_AMBULATORY_CARE_PROVIDER_SITE_OTHER): Payer: 59 | Admitting: Neurology

## 2015-05-28 VITALS — BP 110/70 | HR 75 | Ht 65.0 in | Wt 144.1 lb

## 2015-05-28 DIAGNOSIS — R202 Paresthesia of skin: Secondary | ICD-10-CM

## 2015-05-28 DIAGNOSIS — R42 Dizziness and giddiness: Secondary | ICD-10-CM

## 2015-05-28 LAB — TSH: TSH: 1.28 u[IU]/mL (ref 0.35–4.50)

## 2015-05-28 NOTE — Progress Notes (Signed)
NEUROLOGY FOLLOW UP OFFICE NOTE  Curtis Luna [id number]  HISTORY OF PRESENT ILLNESS: Curtis Luna is a 54 year old right-handed man with migraines and back pain who follows up for vertigo and paresthesias.  NCV-EMG study and labs reviewed.  UPDATE: He is taking gabapentin 100mg  three times daily, which is mildly effective. Neuropathy labs, including ANA, B burgdorferi antibodies, and SPEP/UPEP/IFE, were unremarkable. NCV-EMG from 05/22/15 showed chronic bilateral L5 radiculopathy and mild right carpal tunnel syndrome, but no polyneuropathy.  HISTORY: Since April 2014, he has experienced episodic vertigo which correlates with BPPV.  When it gets severe, he uses a modified Epley maneuver, which helps.  However, about 1 and a half years ago, he has developed a chronic "swimmy" feeling  in his head.  It seems to be exacerbated with any movement of his head, such as scanning the food isles in the grocery store or cooking in the kitchen where he has to keep turning back and forth from the stove or refrigerator to the counter.  He also notices it if he turns over in bed from right to left.  Also, he notices it when he is walking.  He has had testing by ENT, which has been unremarkable.  He reportedly had an MRI of the brain which was unremarkable.  He was given a medication by the ENT, possibly Diamox, which was ineffective.  He saw another neurologist, who prescribed Lexapro, however he had an allergic reaction to this.    He reports that both his brother and sister have been diagnosed with cerebral aneurysm, so MRA of the head was performed on 04/14/15, which was normal, without evidence of aneurysm or vertebro-basilar stenosis.  Since March, he has noted numbness and tingling involving the ankles and ventral aspect of his wrist and forearms.  It started after he switched from Nexium to Protonix.  He stopped the Protonix and started taking 50 mcg pill of B12, which helped.  However the symptoms  returned.  His B12 level from 04/18/15 was 603.  He was started on gabapentin 100mg  at bedtime, which helped for 5 days but then it has started to return.  He visits [place], where he is originally from, in the summer where he has been exposed to ticks, but not recently.  He denies any rash or arthralgias.  Recent blood work includes Sed Rate of 5.  Labs from November include Hgb A1c of 5.4 and TSH of 1.79.  He denies dry eyes, dry mouth, lightheadedness, abnormal sweating, or GI symptoms such as early satiety, diarrhea or constipation.  He also has ocular migraines, where he develops kaleidoscope vision in his periphery, lasting 20 to 60 minutes and without headache.  It occurs about every 3 to 4 months.  PAST MEDICAL HISTORY: Past Medical History  Diagnosis Date  . Diverticulosis of colon (without mention of hemorrhage)   . Esophageal reflux   . Other symptoms involving digestive system(787.99)   . Pain in joint, shoulder region   . Abdominal pain, right lower quadrant   . Backache, unspecified   . Acute bronchospasm   . External hemorrhoids without mention of complication     thrombosed  . Inguinal hernia without mention of obstruction or gangrene, unilateral or unspecified, (not specified as recurrent)   . Pain in joint, forearm   . Thoracic or lumbosacral neuritis or radiculitis, unspecified   . Obstructive sleep apnea (adult) (pediatric)   . Hiatal hernia   . Migraine   . Chicken pox   .  Seasonal allergies     MEDICATIONS: Current Outpatient Prescriptions on File Prior to Visit  Medication Sig Dispense Refill  . aspirin 81 MG tablet Take 81 mg by mouth daily.    [name] gabapentin (NEURONTIN) 100 MG capsule Take 1 tablet by mouth daily for 3 days.  Then increase to 1 tablet BID x 3 days.  Then 1 tablet TID. 90 capsule 3  . LORazepam (ATIVAN) 1 MG tablet Take 1 tablet (1 mg total) by mouth 2 (two) times daily. 60 tablet 1  . ranitidine (ZANTAC) 150 MG tablet Take 1 tablet (150 mg  total) by mouth 2 (two) times daily. 60 tablet 3  . tadalafil (CIALIS) 20 MG tablet Take 1 tablet (20 mg total) by mouth daily as needed for erectile dysfunction. 10 tablet 0   No current facility-administered medications on file prior to visit.    ALLERGIES: Allergies  Allergen Reactions  . Escitalopram Hives    Pt felt anxious  Pt felt his skin was "on fire"  . Protonix [Pantoprazole Sodium] Other (See Comments)    Tingling of extremities    FAMILY HISTORY: Family History  Problem Relation Age of Onset  . Diabetes Maternal Aunt   . Lung cancer Mother 79    Deceased  . Colon cancer Neg Hx   . Other Father     MVA  . Heart disease Paternal Grandmother   . Heart attack Paternal Grandmother   . Healthy Brother     x2  . Aneurysm Sister     Brain  . Allergies Daughter     x2  . Migraines Daughter     x1    SOCIAL HISTORY: History   Social History  . Marital Status: Married    Spouse Name: N/A  . Number of Children: N/A  . Years of Education: N/A   Occupational History  . Technical sales engineer     Social History Main Topics  . Smoking status: Former Smoker    Types: Cigarettes, Cigars    Quit date: 10/04/1987  . Smokeless tobacco: Never Used  . Alcohol Use: 4.2 - 8.4 oz/week    7-14 Standard drinks or equivalent per week     Comment: 2 daily   . Drug Use: No  . Sexual Activity: Not on file   Other Topics Concern  . Not on file   Social History Narrative   1 caffeine drink daily  Lives with wife in a one story home.  Has 2 children.  Works in Estate manager/land agent.    REVIEW OF SYSTEMS: Constitutional: No fevers, chills, or sweats, no generalized fatigue, change in appetite Eyes: No visual changes, double vision, eye pain Ear, nose and throat: No hearing loss, ear pain, nasal congestion, sore throat Cardiovascular: No chest pain, palpitations Respiratory:  No shortness of breath at rest or with exertion, wheezes GastrointestinaI: No nausea, vomiting,  diarrhea, abdominal pain, fecal incontinence Genitourinary:  No dysuria, urinary retention or frequency Musculoskeletal:  No neck pain, back pain Integumentary: No rash, pruritus, skin lesions Neurological: as above Psychiatric: No depression, insomnia, anxiety Endocrine: No palpitations, fatigue, diaphoresis, mood swings, change in appetite, change in weight, increased thirst Hematologic/Lymphatic:  No anemia, purpura, petechiae. Allergic/Immunologic: no itchy/runny eyes, nasal congestion, recent allergic reactions, rashes  PHYSICAL EXAM: Filed Vitals:   05/28/15 1255  BP: 110/70  Pulse: 75   General: No acute distress Head:  Normocephalic/atraumatic Eyes:  Fundoscopic exam unremarkable without vessel changes, exudates, hemorrhages or papilledema. Neck: supple, no paraspinal tenderness,  full range of motion Heart:  Regular rate and rhythm Lungs:  Clear to auscultation bilaterally Back: No paraspinal tenderness Neurological Exam: alert and oriented to person, place, and time. Attention span and concentration intact, recent and remote memory intact, fund of knowledge intact.  Speech fluent and not dysarthric, language intact.  CN II-XII intact. Fundoscopic exam unremarkable without vessel changes, exudates, hemorrhages or papilledema.  Bulk and tone normal, muscle strength 5/5 throughout.  Sensation to light touch, temperature and vibration intact.  Deep tendon reflexes 2+ throughout, toes downgoing.  Finger to nose and heel to shin testing intact.  Gait normal, Romberg negative.  IMPRESSION: Vertigo/vestibulopathy.  Etiology unclear.  It may be a chronic condition that he has to live with.  It is not severe enough that he would want to try another medication Paresthesias.  Consider small fiber neuropathy  PLAN: Check TSH, SSA/SSB antibodies, Celiac panel, B6 and 2 hour glucose tolerance test Further management pending results of labs.  If labs are unremarkable, recommend to monitor  symptoms and follow up as needed.  If symptoms get worse, then he should contact us for follow up and re-evaluation.  15 minutes spent face to face with patient, over 50% spent discussing diagnosis and plan.  Metta Clines, DO  CC:  Raiford Noble, PA-C

## 2015-05-28 NOTE — Patient Instructions (Addendum)
We will check for common causes of small fiber neuropathy:  TSH, SSA/SSB antibodies, Celiac panel, vitamin B6 and 2 hour glucose tolerance test.  If tests are unremarkable, I recommend monitoring symptoms for now and call if symptoms get worse.  Your provider has requested that you have labwork completed today. Please go to [facility] on the second floor of this building before leaving the office today. You do not need to check in. If you are not called within 15 minutes please check with the front desk. We would also like you to have a two hour glucose tolerance test. This test is performed at [facility] located at [facility] - basement level. Please call [phone] to arrange a convenient time to have testing completed. You must have an appt for this test.

## 2015-06-12 ENCOUNTER — Other Ambulatory Visit: Payer: 59

## 2015-06-12 DIAGNOSIS — R42 Dizziness and giddiness: Secondary | ICD-10-CM

## 2015-06-12 LAB — GLUCOSE TOLERANCE, 2 HOURS
Glucose, 1 Hour GTT: 87 mg/dL
Glucose, 2 hour: 88 mg/dL
Glucose, Fasting: 92 mg/dL (ref 70–99)

## 2015-06-25 ENCOUNTER — Other Ambulatory Visit: Payer: Self-pay | Admitting: *Deleted

## 2015-06-25 ENCOUNTER — Telehealth: Payer: Self-pay | Admitting: *Deleted

## 2015-06-25 DIAGNOSIS — G629 Polyneuropathy, unspecified: Secondary | ICD-10-CM

## 2015-06-25 MED ORDER — GABAPENTIN 100 MG PO CAPS: ORAL_CAPSULE | ORAL | Status: DC

## 2015-06-25 NOTE — Telephone Encounter (Signed)
-----   Message from Pieter Partridge, DO sent at 06/24/2015 11:42 AM EDT ----- Glucose tolerance test is normal.

## 2015-06-25 NOTE — Telephone Encounter (Signed)
Patient is aware of lab results.

## 2015-06-25 NOTE — Telephone Encounter (Signed)
Patient is aware that Glucose tolerance test is normal.

## 2015-07-17 ENCOUNTER — Encounter: Payer: Self-pay | Admitting: Physician Assistant

## 2015-07-17 DIAGNOSIS — K219 Gastro-esophageal reflux disease without esophagitis: Secondary | ICD-10-CM

## 2015-07-18 MED ORDER — RANITIDINE HCL 150 MG PO TABS: 150.0000 mg | ORAL_TABLET | Freq: Two times a day (BID) | ORAL | Status: DC

## 2015-08-03 ENCOUNTER — Ambulatory Visit (INDEPENDENT_AMBULATORY_CARE_PROVIDER_SITE_OTHER): Payer: 59 | Admitting: Medical

## 2015-08-03 ENCOUNTER — Ambulatory Visit (HOSPITAL_BASED_OUTPATIENT_CLINIC_OR_DEPARTMENT_OTHER)
Admission: RE | Admit: 2015-08-03 | Discharge: 2015-08-03 | Disposition: A | Discharge status: Home / Self Care | Payer: 59 | Source: Ambulatory Visit | Attending: Medical | Admitting: Medical

## 2015-08-03 ENCOUNTER — Other Ambulatory Visit: Payer: Self-pay | Admitting: Medical

## 2015-08-03 ENCOUNTER — Encounter: Payer: Self-pay | Admitting: Medical

## 2015-08-03 VITALS — BP 133/74 | HR 90 | Temp 97.8°F | Ht 65.0 in | Wt 148.0 lb

## 2015-08-03 DIAGNOSIS — M25562 Pain in left knee: Secondary | ICD-10-CM

## 2015-08-03 DIAGNOSIS — R200 Anesthesia of skin: Secondary | ICD-10-CM | POA: Diagnosis not present

## 2015-08-03 NOTE — Patient Instructions (Signed)
I want to get xray of your knee today. If you experience constant sharp pain then would recommend start alleve or ibuprofen.  Will follow xray and notify you of results. Will go ahead and refer you to sports medicine.  Your sensation of numbness lateral knee is not severe now. You have intact sharp sensation and decreased dull sensation. If you have extensive numbness of leg/ or motor deficits associated with then ED evaluation.  Follow up with me as needed prior to sport medicine referral.

## 2015-08-03 NOTE — Progress Notes (Signed)
Subjective:    Patient ID: Curtis Luna, male    DOB: Dec 14, 1961, 54 y.o.   MRN: [id number]  HPI   Pt in with some sharp pain left knee last weekend. Now this mild superficial tightness to anterior knee. He points to the patella tendon and patella area. Pt states rare occasional pain in the past. That pain is typically sharp and transient. He describes mild dull sensation. When extends knee he states feels sticky sensation then some release of pressure.    Review of Systems  Constitutional: Negative for fever, chills and fatigue.  Respiratory: Negative for cough, chest tightness, shortness of breath and wheezing.   Cardiovascular: Negative for chest pain and palpitations.  Musculoskeletal:       Lt knee discomfort.    Past Medical History  Diagnosis Date  . Diverticulosis of colon (without mention of hemorrhage)   . Esophageal reflux   . Other symptoms involving digestive system(787.99)   . Pain in joint, shoulder region   . Abdominal pain, right lower quadrant   . Backache, unspecified   . Acute bronchospasm   . External hemorrhoids without mention of complication     thrombosed  . Inguinal hernia without mention of obstruction or gangrene, unilateral or unspecified, (not specified as recurrent)   . Pain in joint, forearm   . Thoracic or lumbosacral neuritis or radiculitis, unspecified   . Obstructive sleep apnea (adult) (pediatric)   . Hiatal hernia   . Migraine   . Chicken pox   . Seasonal allergies     Social History   Social History  . Marital Status: Married    Spouse Name: N/A  . Number of Children: N/A  . Years of Education: N/A   Occupational History  . Technical sales engineer     Social History Main Topics  . Smoking status: Former Smoker    Types: Cigarettes, Cigars    Quit date: 10/04/1987  . Smokeless tobacco: Never Used  . Alcohol Use: 4.2 - 8.4 oz/week    7-14 Standard drinks or equivalent per week     Comment: 2 daily   . Drug Use: No  . Sexual  Activity: Not on file   Other Topics Concern  . Not on file   Social History Narrative   1 caffeine drink daily  Lives with wife in a one story home.  Has 2 children.  Works in Estate manager/land agent.    Past Surgical History  Procedure Laterality Date  . Colon resection  10/06    sigmoid colon  . Wisdom tooth extraction    . Inguinal hernia repair  12/2012    Left    Family History  Problem Relation Age of Onset  . Diabetes Maternal Aunt   . Lung cancer Mother 39    Deceased  . Colon cancer Neg Hx   . Other Father     MVA  . Heart disease Paternal Grandmother   . Heart attack Paternal Grandmother   . Healthy Brother     x2  . Aneurysm Sister     Brain  . Allergies Daughter     x2  . Migraines Daughter     x1    Allergies  Allergen Reactions  . Escitalopram Hives    Pt felt anxious  Pt felt his skin was "on fire"  . Protonix [Pantoprazole Sodium] Other (See Comments)    Tingling of extremities    Current Outpatient Prescriptions on File Prior to Visit  Medication Sig  Dispense Refill  . aspirin 81 MG tablet Take 81 mg by mouth daily.    [name] gabapentin (NEURONTIN) 100 MG capsule Take 1 tablet by mouth daily for 3 days.  Then increase to 1 tablet BID x 3 days.  Then 1 tablet TID. 90 capsule 3  . LORazepam (ATIVAN) 1 MG tablet Take 1 tablet (1 mg total) by mouth 2 (two) times daily. 60 tablet 1  . ranitidine (ZANTAC) 150 MG tablet Take 1 tablet (150 mg total) by mouth 2 (two) times daily. 60 tablet 5  . tadalafil (CIALIS) 20 MG tablet Take 1 tablet (20 mg total) by mouth daily as needed for erectile dysfunction. 10 tablet 0   No current facility-administered medications on file prior to visit.    BP 133/74 mmHg  Pulse 90  Temp(Src) 97.8 F (36.6 C) (Oral)  Ht 5\' 5"  (1.651 m)  Wt 148 lb (67.132 kg)  BMI 24.63 kg/m2  SpO2 96%       Objective:   Physical Exam  General- No acute distress. Pleasant patient. Lt knee- from, good flexion and extension, no  swelling, no warmth. Mild crepitus. No pain on palpation tibial plateau. Lt knee sharp discrimination intact. Dull sensation test shows diminished over lateral knee but intact sensation.          Assessment & Plan:  I want to get xray of your knee today. If you experience constant sharp pain then would recommend start alleve or ibuprofen.  Will follow xray and notify you of results. Will go ahead and refer you to sports medicine.  Your sensation of numbness lateral knee is not severe now. You have intact sharp sensation and decreased dull sensation. If you have extensive numbness of leg/ or motor deficits associated with then ED evaluation.  Follow up with me as needed prior to sport medicine referral

## 2015-08-03 NOTE — Progress Notes (Signed)
Pre visit review using our clinic review tool, if applicable. No additional management support is needed unless otherwise documented below in the visit note. 

## 2015-08-06 ENCOUNTER — Encounter: Payer: Self-pay | Admitting: Family Medicine

## 2015-08-06 ENCOUNTER — Ambulatory Visit (INDEPENDENT_AMBULATORY_CARE_PROVIDER_SITE_OTHER): Payer: 59 | Admitting: Family Medicine

## 2015-08-06 VITALS — BP 133/85 | HR 65 | Ht 65.0 in | Wt 150.0 lb

## 2015-08-06 DIAGNOSIS — M25562 Pain in left knee: Secondary | ICD-10-CM | POA: Diagnosis not present

## 2015-08-06 NOTE — Patient Instructions (Signed)
Your knee pain is due to a lateral plica related to patellofemoral syndrome (fat pad impingement is another possibility but treated the same). Avoid painful activities when possible but can do activities as tolerated. Cross train with swimming, cycling with low resistance, elliptical if needed. Straight leg raise, hip side raises, straight leg raises with foot turned outwards 3 sets of 10 once a day. Add ankle weight if thesee become too easy. Consider formal physical therapy Continue using your inserts as you have been. Avoid flat shoes, barefoot walking as much as possible the next 6 weeks. Icing 15 minutes at a time 3-4 times a day as needed. Tylenol or aleve as needed for pain Follow up with me in 6 weeks.

## 2015-08-07 DIAGNOSIS — M25562 Pain in left knee: Secondary | ICD-10-CM | POA: Insufficient documentation

## 2015-08-07 NOTE — Progress Notes (Signed)
PCP and referred by: Leeanne Rio, PA-C  Subjective:   HPI: Patient is a 54 y.o. male here for left knee pain.  Patient denies known injury. States over past 10 years or so he gets similar pain that is very brief and fleeting about 4-5 times a year. Pain to lateral aspect of kneecap and just inferior to this. Also having some localized numbness in this area. No weakness of lower extremities. Has some neuropathy in extremities - unknown cause. Tried knee brace. Thinks maybe stepping up and twisting can bring this on.  Past Medical History  Diagnosis Date  . Diverticulosis of colon (without mention of hemorrhage)   . Esophageal reflux   . Other symptoms involving digestive system(787.99)   . Pain in joint, shoulder region   . Abdominal pain, right lower quadrant   . Backache, unspecified   . Acute bronchospasm   . External hemorrhoids without mention of complication     thrombosed  . Inguinal hernia without mention of obstruction or gangrene, unilateral or unspecified, (not specified as recurrent)   . Pain in joint, forearm   . Thoracic or lumbosacral neuritis or radiculitis, unspecified   . Obstructive sleep apnea (adult) (pediatric)   . Hiatal hernia   . Migraine   . Chicken pox   . Seasonal allergies     Current Outpatient Prescriptions on File Prior to Visit  Medication Sig Dispense Refill  . aspirin 81 MG tablet Take 81 mg by mouth daily.    [name] gabapentin (NEURONTIN) 100 MG capsule Take 1 tablet by mouth daily for 3 days.  Then increase to 1 tablet BID x 3 days.  Then 1 tablet TID. 90 capsule 3  . LORazepam (ATIVAN) 1 MG tablet Take 1 tablet (1 mg total) by mouth 2 (two) times daily. 60 tablet 1  . ranitidine (ZANTAC) 150 MG tablet Take 1 tablet (150 mg total) by mouth 2 (two) times daily. 60 tablet 5  . tadalafil (CIALIS) 20 MG tablet Take 1 tablet (20 mg total) by mouth daily as needed for erectile dysfunction. 10 tablet 0   No current facility-administered  medications on file prior to visit.    Past Surgical History  Procedure Laterality Date  . Colon resection  10/06    sigmoid colon  . Wisdom tooth extraction    . Inguinal hernia repair  12/2012    Left    Allergies  Allergen Reactions  . Escitalopram Hives    Pt felt anxious  Pt felt his skin was "on fire"  . Protonix [Pantoprazole Sodium] Other (See Comments)    Tingling of extremities    Social History   Social History  . Marital Status: Married    Spouse Name: N/A  . Number of Children: N/A  . Years of Education: N/A   Occupational History  . Technical sales engineer     Social History Main Topics  . Smoking status: Former Smoker    Types: Cigarettes, Cigars    Quit date: 10/04/1987  . Smokeless tobacco: Never Used  . Alcohol Use: 4.2 - 8.4 oz/week    7-14 Standard drinks or equivalent per week     Comment: 2 daily   . Drug Use: No  . Sexual Activity: Not on file   Other Topics Concern  . Not on file   Social History Narrative   1 caffeine drink daily  Lives with wife in a one story home.  Has 2 children.  Works in Estate manager/land agent.  Family History  Problem Relation Age of Onset  . Diabetes Maternal Aunt   . Lung cancer Mother 90    Deceased  . Colon cancer Neg Hx   . Other Father     MVA  . Heart disease Paternal Grandmother   . Heart attack Paternal Grandmother   . Healthy Brother     x2  . Aneurysm Sister     Brain  . Allergies Daughter     x2  . Migraines Daughter     x1    BP 133/85 mmHg  Pulse 65  Ht 5\' 5"  (1.651 m)  Wt 150 lb (68.04 kg)  BMI 24.96 kg/m2  Review of Systems: See HPI above.    Objective:  Physical Exam:  Gen: NAD  Left knee: No gross deformity, ecchymoses, swelling.  Mild pronation bilaterally.  No VMO atrophy. No TTP currently. FROM. Negative ant/post drawers. Negative valgus/varus testing. Negative lachmanns. Negative mcmurrays, apleys, patellar apprehension. Hip abduction 5/5 strength. NV intact  distally.    Assessment & Plan:  1. Left knee pain - describes a small lateral plica and location consistent with this.  Exam otherwise benign.  Start with home exercises for this condition (patellofemoral syndrome rehab).  Arch support.  Icing, tylenol/nsaids.  F/u in 6 weeks.

## 2015-08-07 NOTE — Assessment & Plan Note (Signed)
describes a small lateral plica and location consistent with this.  Exam otherwise benign.  Start with home exercises for this condition (patellofemoral syndrome rehab).  Arch support.  Icing, tylenol/nsaids.  F/u in 6 weeks.

## 2015-08-23 ENCOUNTER — Telehealth: Payer: Self-pay | Admitting: Neurology

## 2015-08-23 DIAGNOSIS — G629 Polyneuropathy, unspecified: Secondary | ICD-10-CM

## 2015-08-23 MED ORDER — GABAPENTIN 100 MG PO CAPS: ORAL_CAPSULE | ORAL | Status: DC

## 2015-08-23 NOTE — Telephone Encounter (Signed)
I called patient. He states that he needs a Rx sent to his pharmacy reflecting the directions on how he now takes his gabapentin. He states for the past several months he has been taking gabapentin 100 mg capsules as follows: 1 capsule in the morning, 1 capsule at noon, & 2 capsules at bedtime. He states this dosing works well for his sxs and would like to continue with that. New Rx sent to his pharmacy.

## 2015-08-23 NOTE — Telephone Encounter (Signed)
Pt would like to talk to someone he wants a change in his gabepentin please call [phone]

## 2015-09-17 ENCOUNTER — Ambulatory Visit: Payer: 59 | Admitting: Family Medicine

## 2015-11-02 ENCOUNTER — Telehealth: Payer: Self-pay | Admitting: Behavioral Health

## 2015-11-02 ENCOUNTER — Encounter: Payer: Self-pay | Admitting: Behavioral Health

## 2015-11-02 NOTE — Telephone Encounter (Signed)
Pre-Visit Call completed with patient and chart updated.   Pre-Visit Info documented in Specialty Comments under [employer].    

## 2015-11-05 ENCOUNTER — Encounter: Payer: Self-pay | Admitting: Physician Assistant

## 2015-11-05 ENCOUNTER — Ambulatory Visit (INDEPENDENT_AMBULATORY_CARE_PROVIDER_SITE_OTHER): Payer: 59 | Admitting: Physician Assistant

## 2015-11-05 VITALS — BP 116/71 | HR 72 | Temp 97.7°F | Resp 16 | Ht 65.0 in | Wt 153.0 lb

## 2015-11-05 DIAGNOSIS — Z0000 Encounter for general adult medical examination without abnormal findings: Secondary | ICD-10-CM | POA: Diagnosis not present

## 2015-11-05 DIAGNOSIS — E042 Nontoxic multinodular goiter: Secondary | ICD-10-CM | POA: Insufficient documentation

## 2015-11-05 DIAGNOSIS — R42 Dizziness and giddiness: Secondary | ICD-10-CM

## 2015-11-05 DIAGNOSIS — F39 Unspecified mood [affective] disorder: Secondary | ICD-10-CM | POA: Diagnosis not present

## 2015-11-05 DIAGNOSIS — Z125 Encounter for screening for malignant neoplasm of prostate: Secondary | ICD-10-CM

## 2015-11-05 DIAGNOSIS — E079 Disorder of thyroid, unspecified: Secondary | ICD-10-CM

## 2015-11-05 DIAGNOSIS — G6289 Other specified polyneuropathies: Secondary | ICD-10-CM

## 2015-11-05 DIAGNOSIS — F338 Other recurrent depressive disorders: Secondary | ICD-10-CM

## 2015-11-05 LAB — LIPID PANEL
Cholesterol: 204 mg/dL — ABNORMAL HIGH (ref 0–200)
HDL: 43.9 mg/dL (ref >39.00)
LDL Cholesterol: 130 mg/dL — ABNORMAL HIGH (ref 0–99)
NonHDL: 160.04
Total CHOL/HDL Ratio: 5
Triglycerides: 149 mg/dL (ref 0.0–149.0)
VLDL: 29.8 mg/dL (ref 0.0–40.0)

## 2015-11-05 LAB — HEMOGLOBIN A1C: Hgb A1c MFr Bld: 5.7 % (ref 4.6–6.5)

## 2015-11-05 LAB — COMPREHENSIVE METABOLIC PANEL
ALT: 19 U/L (ref 0–53)
AST: 24 U/L (ref 0–37)
Albumin: 4.1 g/dL (ref 3.5–5.2)
Alkaline Phosphatase: 46 U/L (ref 39–117)
BUN: 15 mg/dL (ref 6–23)
CO2: 28 mEq/L (ref 19–32)
Calcium: 9.5 mg/dL (ref 8.4–10.5)
Chloride: 106 mEq/L (ref 96–112)
Creatinine, Ser: 1 mg/dL (ref 0.40–1.50)
GFR: 82.31 mL/min (ref >60.00)
Glucose, Bld: 90 mg/dL (ref 70–99)
Potassium: 4.1 mEq/L (ref 3.5–5.1)
Sodium: 140 mEq/L (ref 135–145)
Total Bilirubin: 0.5 mg/dL (ref 0.2–1.2)
Total Protein: 6.4 g/dL (ref 6.0–8.3)

## 2015-11-05 LAB — URINALYSIS, ROUTINE W REFLEX MICROSCOPIC
Bilirubin Urine: NEGATIVE
Ketones, ur: NEGATIVE
Leukocytes, UA: NEGATIVE
Nitrite: NEGATIVE
Specific Gravity, Urine: >=1.03 — AB (ref 1.000–1.030)
Total Protein, Urine: NEGATIVE
Urine Glucose: NEGATIVE
Urobilinogen, UA: 0.2 (ref 0.0–1.0)
WBC, UA: NONE SEEN (ref 0–?)
pH: 5.5 (ref 5.0–8.0)

## 2015-11-05 LAB — PSA: PSA: 0.76 ng/mL (ref 0.10–4.00)

## 2015-11-05 LAB — T4, FREE: Free T4: 0.77 ng/dL (ref 0.60–1.60)

## 2015-11-05 LAB — TSH: TSH: 2.11 u[IU]/mL (ref 0.35–4.50)

## 2015-11-05 NOTE — Assessment & Plan Note (Signed)
Mild and intermittent. With left-rotation only when lying. + Marye Round. Continue Ativan as directed. Home Epley maneuvers reviewed. Handout given. Patient to do exercises daily for 2-3 weeks. Follow-up if not resolving.

## 2015-11-05 NOTE — Assessment & Plan Note (Signed)
Followed by Neurology. Is doing well on Gabapentin 100 mg AM, Noon and 200 mg PM. Is resting well.

## 2015-11-05 NOTE — Progress Notes (Signed)
Pre visit review using our clinic review tool, if applicable. No additional management support is needed unless otherwise documented below in the visit note/SLS  

## 2015-11-05 NOTE — Assessment & Plan Note (Addendum)
Depression screen negative. Health Maintenance reviewed -- Colonoscopy up-to-date. Declines flu shot. Tetanus -- 2011 per patient. Preventive schedule discussed and handout given in AVS. Will obtain fasting labs today.

## 2015-11-05 NOTE — Assessment & Plan Note (Signed)
Asymptomatic. Will proceed with screening PSA.

## 2015-11-05 NOTE — Assessment & Plan Note (Signed)
Palpable on left thyroid lobe. Will obtain TSH, T4 today. Order for US thyroid placed.

## 2015-11-05 NOTE — Assessment & Plan Note (Signed)
Doing very well currently without medications. Will continue to monitor.

## 2015-11-05 NOTE — Progress Notes (Signed)
Patient presents to clinic today for annual exam.  Patient is fasting for labs.  Acute Concerns: Patient endorses intermittent vertigo with laying down at night. Symptoms only present if he turns head to the left while lying or quickly when standing. Has seen ENT and was given Rx. Meclizine. Patient states this makes him too drowsy.  Chronic Issues: Peripheral Neuropathy -- Patient followed by Neurology. Symptoms are being well-controlled with Gabapentin regimen.   Seasonal Affective Disorder -- Patient doing well overall. Denies depressed mood or anhedonia at present.  Health Maintenance: Dental -- up-to-date Vision -- up-to-date. Wears corrective lenses. Immunizations -- Declines flu shot. Tetanus up-to-date. Colonoscopy -- up-to-date  Past Medical History  Diagnosis Date  . Diverticulosis of colon (without mention of hemorrhage)   . Esophageal reflux   . Other symptoms involving digestive system(787.99)   . Pain in joint, shoulder region   . Abdominal pain, right lower quadrant   . Backache, unspecified   . Acute bronchospasm   . External hemorrhoids without mention of complication     thrombosed  . Inguinal hernia without mention of obstruction or gangrene, unilateral or unspecified, (not specified as recurrent)   . Pain in joint, forearm   . Thoracic or lumbosacral neuritis or radiculitis, unspecified   . Obstructive sleep apnea (adult) (pediatric)   . Hiatal hernia   . Migraine   . Chicken pox   . Seasonal allergies     Past Surgical History  Procedure Laterality Date  . Colon resection  10/06    sigmoid colon  . Wisdom tooth extraction    . Inguinal hernia repair  12/2012    Left    Current Outpatient Prescriptions on File Prior to Visit  Medication Sig Dispense Refill  . aspirin 81 MG tablet Take 81 mg by mouth daily.    [name] gabapentin (NEURONTIN) 100 MG capsule Take 1 capsule every morning, 1 capsule at noon, and 2 capsules at bedtime 120 capsule 4  .  LORazepam (ATIVAN) 1 MG tablet Take 1 tablet (1 mg total) by mouth 2 (two) times daily. 60 tablet 1  . ranitidine (ZANTAC) 150 MG tablet Take 1 tablet (150 mg total) by mouth 2 (two) times daily. 60 tablet 5  . tadalafil (CIALIS) 20 MG tablet Take 1 tablet (20 mg total) by mouth daily as needed for erectile dysfunction. 10 tablet 0   No current facility-administered medications on file prior to visit.    Allergies  Allergen Reactions  . Escitalopram Hives    Pt felt anxious  Pt felt his skin was "on fire"  . Protonix [Pantoprazole Sodium] Other (See Comments)    Tingling of extremities    Family History  Problem Relation Age of Onset  . Diabetes Maternal Aunt   . Lung cancer Mother 63    Deceased  . Colon cancer Neg Hx   . Other Father     MVA  . Heart disease Paternal Grandmother   . Heart attack Paternal Grandmother   . Healthy Brother     x2  . Aneurysm Sister     Brain  . Allergies Daughter     x2  . Migraines Daughter     x1    Social History   Social History  . Marital Status: Married    Spouse Name: N/A  . Number of Children: N/A  . Years of Education: N/A   Occupational History  . Technical sales engineer     Social History Main Topics  .  Smoking status: Former Smoker    Types: Cigarettes, Cigars    Quit date: 10/04/1987  . Smokeless tobacco: Never Used  . Alcohol Use: 4.2 - 8.4 oz/week    7-14 Standard drinks or equivalent per week     Comment: 2 daily   . Drug Use: No  . Sexual Activity: Not on file   Other Topics Concern  . Not on file   Social History Narrative   1 caffeine drink daily  Lives with wife in a one story home.  Has 2 children.  Works in Estate manager/land agent.    Review of Systems  Constitutional: Negative for fever and weight loss.  HENT: Negative for ear discharge, ear pain, hearing loss and tinnitus.   Eyes: Negative for blurred vision, double vision, photophobia and pain.  Respiratory: Negative for cough and shortness of  breath.   Cardiovascular: Negative for chest pain and palpitations.  Gastrointestinal: Negative for heartburn, nausea, vomiting, abdominal pain, diarrhea, constipation, blood in stool and melena.  Genitourinary: Negative for dysuria, urgency, frequency, hematuria and flank pain.  Musculoskeletal: Negative for falls.  Neurological: Positive for dizziness and tingling. Negative for loss of consciousness and headaches.  Endo/Heme/Allergies: Negative for environmental allergies.  Psychiatric/Behavioral: Negative for depression, suicidal ideas, hallucinations and substance abuse. The patient is not nervous/anxious and does not have insomnia.    BP 116/71 mmHg  Pulse 72  Temp(Src) 97.7 F (36.5 C) (Oral)  Resp 16  Ht 5\' 5"  (1.651 m)  Wt 153 lb (69.4 kg)  BMI 25.46 kg/m2  Physical Exam  Constitutional: He is oriented to person, place, and time and well-developed, well-nourished, and in no distress.  HENT:  Head: Normocephalic and atraumatic.  Right Ear: External ear normal.  Left Ear: External ear normal.  Nose: Nose normal.  Mouth/Throat: Oropharynx is clear and moist. No oropharyngeal exudate.  + Marye Round -- left beading nystagmus  Eyes: Conjunctivae and EOM are normal. Pupils are equal, round, and reactive to light.  Neck: Neck supple.  Mass of left lobe of thyroid about 1.5 cm palpable on exam. No other palpable nodules.  Cardiovascular: Normal rate, regular rhythm, normal heart sounds and intact distal pulses.   Pulmonary/Chest: Effort normal and breath sounds normal. No respiratory distress. He has no wheezes. He has no rales. He exhibits no tenderness.  Abdominal: Soft. Bowel sounds are normal. He exhibits no distension and no mass. There is no tenderness. There is no rebound and no guarding.  Genitourinary: Testes/scrotum normal.  Lymphadenopathy:    He has no cervical adenopathy.  Neurological: He is alert and oriented to person, place, and time.  Skin: Skin is warm and  dry. No rash noted.  Psychiatric: Affect normal.  Vitals reviewed.   No results found for this or any previous visit (from the past 2160 hour(s)).  Assessment/Plan: Vertigo Mild and intermittent. With left-rotation only when lying. + Marye Round. Continue Ativan as directed. Home Epley maneuvers reviewed. Handout given. Patient to do exercises daily for 2-3 weeks. Follow-up if not resolving.  Visit for preventive health examination Depression screen negative. Health Maintenance reviewed -- Colonoscopy up-to-date. Declines flu shot. Tetanus -- 2011 per patient. Preventive schedule discussed and handout given in AVS. Will obtain fasting labs today.   Peripheral neuropathy Followed by Neurology. Is doing well on Gabapentin 100 mg AM, Noon and 200 mg PM. Is resting well.  Seasonal affective disorder Doing very well currently without medications. Will continue to monitor.  Prostate cancer screening Asymptomatic. Will proceed with  screening PSA.  Thyroid mass Palpable on left thyroid lobe. Will obtain TSH, T4 today. Order for US thyroid placed.

## 2015-11-05 NOTE — Patient Instructions (Signed)
Please go to the lab for blood work. I will call you with your results.  You will be contacted for an ultrasound of thyroid to assess the mass on your neck. I am also checking your thyroid function.  Please continue medications as directed. Follow the exercises below for your dizziness. Ativan as directed. Let me know if symptoms are not resolving.  Preventive Care for Adults, Male A healthy lifestyle and preventive care can promote health and wellness. Preventive health guidelines for men include the following key practices:  A routine yearly physical is a good way to check with your health care provider about your health and preventative screening. It is a chance to share any concerns and updates on your health and to receive a thorough exam.  Visit your dentist for a routine exam and preventative care every 6 months. Brush your teeth twice a day and floss once a day. Good oral hygiene prevents tooth decay and gum disease.  The frequency of eye exams is based on your age, health, family medical history, use of contact lenses, and other factors. Follow your health care provider's recommendations for frequency of eye exams.  Eat a healthy diet. Foods such as vegetables, fruits, whole grains, low-fat dairy products, and lean protein foods contain the nutrients you need without too many calories. Decrease your intake of foods high in solid fats, added sugars, and salt. Eat the right amount of calories for you.Get information about a proper diet from your health care provider, if necessary.  Regular physical exercise is one of the most important things you can do for your health. Most adults should get at least 150 minutes of moderate-intensity exercise (any activity that increases your heart rate and causes you to sweat) each week. In addition, most adults need muscle-strengthening exercises on 2 or more days a week.  Maintain a healthy weight. The body mass index (BMI) is a screening tool to  identify possible weight problems. It provides an estimate of body fat based on height and weight. Your health care provider can find your BMI and can help you achieve or maintain a healthy weight.For adults 20 years and older:  A BMI below 18.5 is considered underweight.  A BMI of 18.5 to 24.9 is normal.  A BMI of 25 to 29.9 is considered overweight.  A BMI of 30 and above is considered obese.  Maintain normal blood lipids and cholesterol levels by exercising and minimizing your intake of saturated fat. Eat a balanced diet with plenty of fruit and vegetables. Blood tests for lipids and cholesterol should begin at age 66 and be repeated every 5 years. If your lipid or cholesterol levels are high, you are over 50, or you are at high risk for heart disease, you may need your cholesterol levels checked more frequently.Ongoing high lipid and cholesterol levels should be treated with medicines if diet and exercise are not working.  If you smoke, find out from your health care provider how to quit. If you do not use tobacco, do not start.  Lung cancer screening is recommended for adults aged 69-80 years who are at high risk for developing lung cancer because of a history of smoking. A yearly low-dose CT scan of the lungs is recommended for people who have at least a 30-pack-year history of smoking and are a current smoker or have quit within the past 15 years. A pack year of smoking is smoking an average of 1 pack of cigarettes a day for 1 year (  for example: 1 pack a day for 30 years or 2 packs a day for 15 years). Yearly screening should continue until the smoker has stopped smoking for at least 15 years. Yearly screening should be stopped for people who develop a health problem that would prevent them from having lung cancer treatment.  If you choose to drink alcohol, do not have more than 2 drinks per day. One drink is considered to be 12 ounces (355 mL) of beer, 5 ounces (148 mL) of wine, or 1.5  ounces (44 mL) of liquor.  Avoid use of street drugs. Do not share needles with anyone. Ask for help if you need support or instructions about stopping the use of drugs.  High blood pressure causes heart disease and increases the risk of stroke. Your blood pressure should be checked at least every 1-2 years. Ongoing high blood pressure should be treated with medicines, if weight loss and exercise are not effective.  If you are 38-74 years old, ask your health care provider if you should take aspirin to prevent heart disease.  Diabetes screening is done by taking a blood sample to check your blood glucose level after you have not eaten for a certain period of time (fasting). If you are not overweight and you do not have risk factors for diabetes, you should be screened once every 3 years starting at age 1. If you are overweight or obese and you are 18-29 years of age, you should be screened for diabetes every year as part of your cardiovascular risk assessment.  Colorectal cancer can be detected and often prevented. Most routine colorectal cancer screening begins at the age of 77 and continues through age 72. However, your health care provider may recommend screening at an earlier age if you have risk factors for colon cancer. On a yearly basis, your health care provider may provide home test kits to check for hidden blood in the stool. Use of a small camera at the end of a tube to directly examine the colon (sigmoidoscopy or colonoscopy) can detect the earliest forms of colorectal cancer. Talk to your health care provider about this at age 39, when routine screening begins. Direct exam of the colon should be repeated every 5-10 years through age 58, unless early forms of precancerous polyps or small growths are found.  People who are at an increased risk for hepatitis B should be screened for this virus. You are considered at high risk for hepatitis B if:  You were born in a country where hepatitis B  occurs often. Talk with your health care provider about which countries are considered high risk.  Your parents were born in a high-risk country and you have not received a shot to protect against hepatitis B (hepatitis B vaccine).  You have HIV or AIDS.  You use needles to inject street drugs.  You live with, or have sex with, someone who has hepatitis B.  You are a man who has sex with other men (MSM).  You get hemodialysis treatment.  You take certain medicines for conditions such as cancer, organ transplantation, and autoimmune conditions.  Hepatitis C blood testing is recommended for all people born from [birth year] through 1965 and any individual with known risks for hepatitis C.  Practice safe sex. Use condoms and avoid high-risk sexual practices to reduce the spread of sexually transmitted infections (STIs). STIs include gonorrhea, chlamydia, syphilis, trichomonas, herpes, HPV, and human immunodeficiency virus (HIV). Herpes, HIV, and HPV are viral illnesses that  have no cure. They can result in disability, cancer, and death.  If you are a man who has sex with other men, you should be screened at least once per year for:  HIV.  Urethral, rectal, and pharyngeal infection of gonorrhea, chlamydia, or both.  If you are at risk of being infected with HIV, it is recommended that you take a prescription medicine daily to prevent HIV infection. This is called preexposure prophylaxis (PrEP). You are considered at risk if:  You are a man who has sex with other men (MSM) and have other risk factors.  You are a heterosexual man, are sexually active, and are at increased risk for HIV infection.  You take drugs by injection.  You are sexually active with a partner who has HIV.  Talk with your health care provider about whether you are at high risk of being infected with HIV. If you choose to begin PrEP, you should first be tested for HIV. You should then be tested every 3 months for as long as  you are taking PrEP.  A one-time screening for abdominal aortic aneurysm (AAA) and surgical repair of large AAAs by ultrasound are recommended for men ages 67 to 74 years who are current or former smokers.  Healthy men should no longer receive prostate-specific antigen (PSA) blood tests as part of routine cancer screening. Talk with your health care provider about prostate cancer screening.  Testicular cancer screening is not recommended for adult males who have no symptoms. Screening includes self-exam, a health care provider exam, and other screening tests. Consult with your health care provider about any symptoms you have or any concerns you have about testicular cancer.  Use sunscreen. Apply sunscreen liberally and repeatedly throughout the day. You should seek shade when your shadow is shorter than you. Protect yourself by wearing long sleeves, pants, a wide-brimmed hat, and sunglasses year round, whenever you are outdoors.  Once a month, do a whole-body skin exam, using a mirror to look at the skin on your back. Tell your health care provider about new moles, moles that have irregular borders, moles that are larger than a pencil eraser, or moles that have changed in shape or color.  Stay current with required vaccines (immunizations).  Influenza vaccine. All adults should be immunized every year.  Tetanus, diphtheria, and acellular pertussis (Td, Tdap) vaccine. An adult who has not previously received Tdap or who does not know his vaccine status should receive 1 dose of Tdap. This initial dose should be followed by tetanus and diphtheria toxoids (Td) booster doses every 10 years. Adults with an unknown or incomplete history of completing a 3-dose immunization series with Td-containing vaccines should begin or complete a primary immunization series including a Tdap dose. Adults should receive a Td booster every 10 years.  Varicella vaccine. An adult without evidence of immunity to varicella  should receive 2 doses or a second dose if he has previously received 1 dose.  Human papillomavirus (HPV) vaccine. Males aged 11-21 years who have not received the vaccine previously should receive the 3-dose series. Males aged 22-26 years may be immunized. Immunization is recommended through the age of 72 years for any male who has sex with males and did not get any or all doses earlier. Immunization is recommended for any person with an immunocompromised condition through the age of 46 years if he did not get any or all doses earlier. During the 3-dose series, the second dose should be obtained 4-8 weeks after  the first dose. The third dose should be obtained 24 weeks after the first dose and 16 weeks after the second dose.  Zoster vaccine. One dose is recommended for adults aged 12 years or older unless certain conditions are present.  Measles, mumps, and rubella (MMR) vaccine. Adults born before [birth year] generally are considered immune to measles and mumps. Adults born in [birth year] or later should have 1 or more doses of MMR vaccine unless there is a contraindication to the vaccine or there is laboratory evidence of immunity to each of the three diseases. A routine second dose of MMR vaccine should be obtained at least 28 days after the first dose for students attending postsecondary schools, health care workers, or international travelers. People who received inactivated measles vaccine or an unknown type of measles vaccine during 1963-1967 should receive 2 doses of MMR vaccine. People who received inactivated mumps vaccine or an unknown type of mumps vaccine before 1979 and are at high risk for mumps infection should consider immunization with 2 doses of MMR vaccine. Unvaccinated health care workers born before [birth year] who lack laboratory evidence of measles, mumps, or rubella immunity or laboratory confirmation of disease should consider measles and mumps immunization with 2 doses of MMR vaccine or rubella  immunization with 1 dose of MMR vaccine.  Pneumococcal 13-valent conjugate (PCV13) vaccine. When indicated, a person who is uncertain of his immunization history and has no record of immunization should receive the PCV13 vaccine. All adults 40 years of age and older should receive this vaccine. An adult aged 63 years or older who has certain medical conditions and has not been previously immunized should receive 1 dose of PCV13 vaccine. This PCV13 should be followed with a dose of pneumococcal polysaccharide (PPSV23) vaccine. Adults who are at high risk for pneumococcal disease should obtain the PPSV23 vaccine at least 8 weeks after the dose of PCV13 vaccine. Adults older than 55 years of age who have normal immune system function should obtain the PPSV23 vaccine dose at least 1 year after the dose of PCV13 vaccine.  Pneumococcal polysaccharide (PPSV23) vaccine. When PCV13 is also indicated, PCV13 should be obtained first. All adults aged 16 years and older should be immunized. An adult younger than age 59 years who has certain medical conditions should be immunized. Any person who resides in a nursing home or long-term care facility should be immunized. An adult smoker should be immunized. People with an immunocompromised condition and certain other conditions should receive both PCV13 and PPSV23 vaccines. People with human immunodeficiency virus (HIV) infection should be immunized as soon as possible after diagnosis. Immunization during chemotherapy or radiation therapy should be avoided. Routine use of PPSV23 vaccine is not recommended for American Indians, [place] Natives, or people younger than 65 years unless there are medical conditions that require PPSV23 vaccine. When indicated, people who have unknown immunization and have no record of immunization should receive PPSV23 vaccine. One-time revaccination 5 years after the first dose of PPSV23 is recommended for people aged 19-64 years who have chronic  kidney failure, nephrotic syndrome, asplenia, or immunocompromised conditions. People who received 1-2 doses of PPSV23 before age 44 years should receive another dose of PPSV23 vaccine at age 66 years or later if at least 5 years have passed since the previous dose. Doses of PPSV23 are not needed for people immunized with PPSV23 at or after age 43 years.  Meningococcal vaccine. Adults with asplenia or persistent complement component deficiencies should receive 2 doses of quadrivalent meningococcal  conjugate (MenACWY-D) vaccine. The doses should be obtained at least 2 months apart. Microbiologists working with certain meningococcal bacteria, [employer] recruits, people at risk during an outbreak, and people who travel to or live in countries with a high rate of meningitis should be immunized. A first-year college student up through age 19 years who is living in a residence hall should receive a dose if he did not receive a dose on or after his 16th birthday. Adults who have certain high-risk conditions should receive one or more doses of vaccine.  Hepatitis A vaccine. Adults who wish to be protected from this disease, have chronic liver disease, work with hepatitis A-infected animals, work in hepatitis A research labs, or travel to or work in countries with a high rate of hepatitis A should be immunized. Adults who were previously unvaccinated and who anticipate close contact with an international adoptee during the first 60 days after arrival in the [place] from a country with a high rate of hepatitis A should be immunized.  Hepatitis B vaccine. Adults should be immunized if they wish to be protected from this disease, are under age 22 years and have diabetes, have chronic liver disease, have had more than one sex partner in the past 6 months, may be exposed to blood or other infectious body fluids, are household contacts or sex partners of hepatitis B positive people, are clients or workers in certain  care facilities, or travel to or work in countries with a high rate of hepatitis B.  Haemophilus influenzae type b (Hib) vaccine. A previously unvaccinated person with asplenia or sickle cell disease or having a scheduled splenectomy should receive 1 dose of Hib vaccine. Regardless of previous immunization, a recipient of a hematopoietic stem cell transplant should receive a 3-dose series 6-12 months after his successful transplant. Hib vaccine is not recommended for adults with HIV infection. Preventive Service / Frequency Ages 6 to 50  Blood pressure check.** / Every 3-5 years.  Lipid and cholesterol check.** / Every 5 years beginning at age 77.  Hepatitis C blood test.** / For any individual with known risks for hepatitis C.  Skin self-exam. / Monthly.  Influenza vaccine. / Every year.  Tetanus, diphtheria, and acellular pertussis (Tdap, Td) vaccine.** / Consult your health care provider. 1 dose of Td every 10 years.  Varicella vaccine.** / Consult your health care provider.  HPV vaccine. / 3 doses over 6 months, if 37 or younger.  Measles, mumps, rubella (MMR) vaccine.** / You need at least 1 dose of MMR if you were born in 1957 or later. You may also need a second dose.  Pneumococcal 13-valent conjugate (PCV13) vaccine.** / Consult your health care provider.  Pneumococcal polysaccharide (PPSV23) vaccine.** / 1 to 2 doses if you smoke cigarettes or if you have certain conditions.  Meningococcal vaccine.** / 1 dose if you are age 53 to 22 years and a Market researcher living in a residence hall, or have one of several medical conditions. You may also need additional booster doses.  Hepatitis A vaccine.** / Consult your health care provider.  Hepatitis B vaccine.** / Consult your health care provider.  Haemophilus influenzae type b (Hib) vaccine.** / Consult your health care provider. Ages 55 to 78  Blood pressure check.** / Every year.  Lipid and cholesterol  check.** / Every 5 years beginning at age 41.  Lung cancer screening. / Every year if you are aged 40-80 years and have a 30-pack-year history of smoking and  currently smoke or have quit within the past 15 years. Yearly screening is stopped once you have quit smoking for at least 15 years or develop a health problem that would prevent you from having lung cancer treatment.  Fecal occult blood test (FOBT) of stool. / Every year beginning at age 37 and continuing until age 75. You may not have to do this test if you get a colonoscopy every 10 years.  Flexible sigmoidoscopy** or colonoscopy.** / Every 5 years for a flexible sigmoidoscopy or every 10 years for a colonoscopy beginning at age 48 and continuing until age 38.  Hepatitis C blood test.** / For all people born from [birth year] through 1966 and any individual with known risks for hepatitis C.  Skin self-exam. / Monthly.  Influenza vaccine. / Every year.  Tetanus, diphtheria, and acellular pertussis (Tdap/Td) vaccine.** / Consult your health care provider. 1 dose of Td every 10 years.  Varicella vaccine.** / Consult your health care provider.  Zoster vaccine.** / 1 dose for adults aged 57 years or older.  Measles, mumps, rubella (MMR) vaccine.** / You need at least 1 dose of MMR if you were born in 1958 or later. You may also need a second dose.  Pneumococcal 13-valent conjugate (PCV13) vaccine.** / Consult your health care provider.  Pneumococcal polysaccharide (PPSV23) vaccine.** / 1 to 2 doses if you smoke cigarettes or if you have certain conditions.  Meningococcal vaccine.** / Consult your health care provider.  Hepatitis A vaccine.** / Consult your health care provider.  Hepatitis B vaccine.** / Consult your health care provider.  Haemophilus influenzae type b (Hib) vaccine.** / Consult your health care provider. Ages 42 and over  Blood pressure check.** / Every year.  Lipid and cholesterol check.**/ Every 5 years beginning at  age 58.  Lung cancer screening. / Every year if you are aged 41-80 years and have a 30-pack-year history of smoking and currently smoke or have quit within the past 15 years. Yearly screening is stopped once you have quit smoking for at least 15 years or develop a health problem that would prevent you from having lung cancer treatment.  Fecal occult blood test (FOBT) of stool. / Every year beginning at age 12 and continuing until age 63. You may not have to do this test if you get a colonoscopy every 10 years.  Flexible sigmoidoscopy** or colonoscopy.** / Every 5 years for a flexible sigmoidoscopy or every 10 years for a colonoscopy beginning at age 75 and continuing until age 44.  Hepatitis C blood test.** / For all people born from [birth year] through 1966 and any individual with known risks for hepatitis C.  Abdominal aortic aneurysm (AAA) screening.** / A one-time screening for ages 91 to 40 years who are current or former smokers.  Skin self-exam. / Monthly.  Influenza vaccine. / Every year.  Tetanus, diphtheria, and acellular pertussis (Tdap/Td) vaccine.** / 1 dose of Td every 10 years.  Varicella vaccine.** / Consult your health care provider.  Zoster vaccine.** / 1 dose for adults aged 12 years or older.  Pneumococcal 13-valent conjugate (PCV13) vaccine.** / 1 dose for all adults aged 52 years and older.  Pneumococcal polysaccharide (PPSV23) vaccine.** / 1 dose for all adults aged 41 years and older.  Meningococcal vaccine.** / Consult your health care provider.  Hepatitis A vaccine.** / Consult your health care provider.  Hepatitis B vaccine.** / Consult your health care provider.  Haemophilus influenzae type b (Hib) vaccine.** / Consult your health care  provider. **Family history and personal history of risk and conditions may change your health care provider's recommendations.   This information is not intended to replace advice given to you by your health care provider. Make  sure you discuss any questions you have with your health care provider.   Document Released: 01/27/2002 Document Revised: 12/22/2014 Document Reviewed: 04/28/2011 Elsevier Interactive Patient Education [employer].

## 2015-11-06 LAB — CBC
HCT: 44.1 % (ref 39.0–52.0)
Hemoglobin: 14.9 g/dL (ref 13.0–17.0)
MCHC: 33.8 g/dL (ref 30.0–36.0)
MCV: 93.3 fl (ref 78.0–100.0)
Platelets: 284 10*3/uL (ref 150.0–400.0)
RBC: 4.73 Mil/uL (ref 4.22–5.81)
RDW: 14.3 % (ref 11.5–15.5)
WBC: 5.5 10*3/uL (ref 4.0–10.5)

## 2015-11-07 ENCOUNTER — Encounter: Payer: Self-pay | Admitting: Physician Assistant

## 2015-11-15 ENCOUNTER — Telehealth: Payer: Self-pay | Admitting: Physician Assistant

## 2015-11-15 DIAGNOSIS — E079 Disorder of thyroid, unspecified: Secondary | ICD-10-CM

## 2015-11-15 NOTE — Addendum Note (Signed)
Addended by: Raiford Noble on: 11/15/2015 09:02 PM   Modules accepted: Orders

## 2015-11-15 NOTE — Telephone Encounter (Signed)
Forwarded to referral dept. Will notify patient when I get answer. Patient advised.

## 2015-11-15 NOTE — Telephone Encounter (Signed)
Order resubmitted. He should be contacted in the next week.

## 2015-11-15 NOTE — Telephone Encounter (Signed)
Caller name: Self   Can be reached: [phone]   Reason for call: Patient calling to find out about [place] of Thyroid that he was suppose to have. States someone was suppose to call him to schedule it. Plse adv

## 2015-11-19 ENCOUNTER — Ambulatory Visit (HOSPITAL_BASED_OUTPATIENT_CLINIC_OR_DEPARTMENT_OTHER)
Admission: RE | Admit: 2015-11-19 | Discharge: 2015-11-19 | Disposition: A | Discharge status: Home / Self Care | Payer: 59 | Source: Ambulatory Visit | Attending: Physician Assistant | Admitting: Physician Assistant

## 2015-11-19 DIAGNOSIS — E042 Nontoxic multinodular goiter: Secondary | ICD-10-CM | POA: Diagnosis not present

## 2015-11-19 DIAGNOSIS — E079 Disorder of thyroid, unspecified: Secondary | ICD-10-CM | POA: Diagnosis present

## 2015-12-25 MED FILL — GABAPENTIN 100 MG CAPSULE: 100 | 30 days supply | Qty: 120 | Fill #4

## 2015-12-27 MED FILL — raNITIdine HCL 150 MG TABS: 150 | 30 days supply | Qty: 60 | Fill #5

## 2016-01-21 ENCOUNTER — Telehealth: Payer: Self-pay | Admitting: Neurology

## 2016-01-21 DIAGNOSIS — G6289 Other specified polyneuropathies: Secondary | ICD-10-CM

## 2016-01-21 MED ORDER — GABAPENTIN 100 MG PO CAPS: ORAL_CAPSULE | ORAL | Status: DC

## 2016-01-21 MED FILL — GABAPENTIN 100 MG CAPSULE: 100 | 30 days supply | Qty: 120 | Fill #0

## 2016-01-21 NOTE — Telephone Encounter (Signed)
RX sent in. Pt aware.  

## 2016-01-21 NOTE — Telephone Encounter (Signed)
Pt needs a refill on his gabapentin called in to [facility] in [place] pt phone number is [phone]

## 2016-01-22 MED FILL — SM ACID REDUCER 150 MG TAB: 150 MG | 33 days supply | Qty: 65 | Fill #0

## 2016-02-06 ENCOUNTER — Ambulatory Visit (INDEPENDENT_AMBULATORY_CARE_PROVIDER_SITE_OTHER): Payer: 59 | Admitting: Physician Assistant

## 2016-02-06 ENCOUNTER — Encounter: Payer: Self-pay | Admitting: Physician Assistant

## 2016-02-06 VITALS — BP 122/68 | HR 87 | Temp 98.1°F | Ht 65.0 in | Wt 151.8 lb

## 2016-02-06 DIAGNOSIS — J069 Acute upper respiratory infection, unspecified: Secondary | ICD-10-CM | POA: Diagnosis not present

## 2016-02-06 MED ORDER — BENZONATATE 100 MG PO CAPS: 100.0000 mg | ORAL_CAPSULE | Freq: Two times a day (BID) | ORAL | Status: DC | PRN

## 2016-02-06 MED ORDER — FLUTICASONE PROPIONATE 50 MCG/ACT NA SUSP: 2.0000 | Freq: Every day | NASAL | Status: DC

## 2016-02-06 MED FILL — BENZONATATE 100 MG CAPSULE: 100 | 10 days supply | Qty: 20 | Fill #0

## 2016-02-06 MED FILL — FLUTICASONE PROP 50 MCG SPR: 50 | 30 days supply | Qty: 16 | Fill #0

## 2016-02-06 NOTE — Progress Notes (Signed)
Pre visit review using our clinic review tool, if applicable. No additional management support is needed unless otherwise documented below in the visit note. 

## 2016-02-06 NOTE — Progress Notes (Signed)
Patient presents to clinic today c/o 4 days of fatigue, runny nose, watery eyes and sore throat. Endorses feeling better over the past 2 days but then since last night, symptoms have worsened. Denies fever, chills. Denies recent travel or sick contact.   Past Medical History  Diagnosis Date  . Diverticulosis of colon (without mention of hemorrhage)   . Esophageal reflux   . Other symptoms involving digestive system(787.99)   . Pain in joint, shoulder region   . Abdominal pain, right lower quadrant   . Backache, unspecified   . Acute bronchospasm   . External hemorrhoids without mention of complication     thrombosed  . Inguinal hernia without mention of obstruction or gangrene, unilateral or unspecified, (not specified as recurrent)   . Pain in joint, forearm   . Thoracic or lumbosacral neuritis or radiculitis, unspecified   . Obstructive sleep apnea (adult) (pediatric)   . Hiatal hernia   . Migraine   . Chicken pox   . Seasonal allergies     Current Outpatient Prescriptions on File Prior to Visit  Medication Sig Dispense Refill  . aspirin 81 MG tablet Take 81 mg by mouth daily.    [name] gabapentin (NEURONTIN) 100 MG capsule Take 1 capsule every morning, 1 capsule at noon, and 2 capsules at bedtime 120 capsule 4  . LORazepam (ATIVAN) 1 MG tablet Take 1 tablet (1 mg total) by mouth 2 (two) times daily. 60 tablet 1  . ranitidine (ZANTAC) 150 MG tablet Take 1 tablet (150 mg total) by mouth 2 (two) times daily. 60 tablet 5  . tadalafil (CIALIS) 20 MG tablet Take 1 tablet (20 mg total) by mouth daily as needed for erectile dysfunction. 10 tablet 0   No current facility-administered medications on file prior to visit.    Allergies  Allergen Reactions  . Escitalopram Hives    Pt felt anxious  Pt felt his skin was "on fire"  . Protonix [Pantoprazole Sodium] Other (See Comments)    Tingling of extremities    Family History  Problem Relation Age of Onset  . Diabetes Maternal Aunt     . Lung cancer Mother 71    Deceased  . Colon cancer Neg Hx   . Other Father     MVA  . Heart disease Paternal Grandmother   . Heart attack Paternal Grandmother   . Healthy Brother     x2  . Aneurysm Sister     Brain  . Allergies Daughter     x2  . Migraines Daughter     x1    Social History   Social History  . Marital Status: Married    Spouse Name: N/A  . Number of Children: N/A  . Years of Education: N/A   Occupational History  . Technical sales engineer     Social History Main Topics  . Smoking status: Former Smoker    Types: Cigarettes, Cigars    Quit date: 10/04/1987  . Smokeless tobacco: Never Used  . Alcohol Use: 4.2 - 8.4 oz/week    7-14 Standard drinks or equivalent per week     Comment: 2 daily   . Drug Use: No  . Sexual Activity: Not Asked   Other Topics Concern  . None   Social History Narrative   1 caffeine drink daily  Lives with wife in a one story home.  Has 2 children.  Works in Estate manager/land agent.   Review of Systems - See HPI.  All other ROS  are negative.  BP 122/68 mmHg  Pulse 87  Temp(Src) 98.1 F (36.7 C) (Oral)  Ht 5\' 5"  (1.651 m)  Wt 151 lb 12.8 oz (68.856 kg)  BMI 25.26 kg/m2  SpO2 98%  Physical Exam  Constitutional: He is oriented to person, place, and time and well-developed, well-nourished, and in no distress.  HENT:  Head: Normocephalic and atraumatic.  Right Ear: Tympanic membrane normal.  Left Ear: Tympanic membrane normal.  Nose: Mucosal edema and rhinorrhea present. Right sinus exhibits no maxillary sinus tenderness. Left sinus exhibits no maxillary sinus tenderness.  Mouth/Throat: Uvula is midline, oropharynx is clear and moist and mucous membranes are normal.  Eyes: Conjunctivae are normal.  Neck: Neck supple.  Cardiovascular: Normal rate, regular rhythm, normal heart sounds and intact distal pulses.   Pulmonary/Chest: Effort normal and breath sounds normal. No respiratory distress. He has no wheezes. He has no  rales. He exhibits no tenderness.  Lymphadenopathy:    He has no cervical adenopathy.  Neurological: He is alert and oriented to person, place, and time. GCS score is 15.  Skin: Skin is warm and dry. No rash noted.  Psychiatric: Affect normal.  Vitals reviewed.    Assessment/Plan: Viral URI Afebrile. Exam with rhinorrhea and nasal mucosal edema. No other abnormal findings. Supportive measures reviewed. Rx Tessalon and Flonase. Follow-up if symptoms are not resolving.

## 2016-02-06 NOTE — Assessment & Plan Note (Signed)
Afebrile. Exam with rhinorrhea and nasal mucosal edema. No other abnormal findings. Supportive measures reviewed. Rx Tessalon and Flonase. Follow-up if symptoms are not resolving.

## 2016-02-06 NOTE — Patient Instructions (Signed)
Please stay well hydrated and get plenty of rest. Take the Tessalon as directed for cough and use the Flonase daily. Start a daily Claritin. Place a humidifier in the bedroom.  Follow-up if symptoms are not improving.  Upper Respiratory Infection, Adult Most upper respiratory infections (URIs) are a viral infection of the air passages leading to the lungs. A URI affects the nose, throat, and upper air passages. The most common type of URI is nasopharyngitis and is typically referred to as "the common cold." URIs run their course and usually go away on their own. Most of the time, a URI does not require medical attention, but sometimes a bacterial infection in the upper airways can follow a viral infection. This is called a secondary infection. Sinus and middle ear infections are common types of secondary upper respiratory infections. Bacterial pneumonia can also complicate a URI. A URI can worsen asthma and chronic obstructive pulmonary disease (COPD). Sometimes, these complications can require emergency medical care and may be life threatening.  CAUSES Almost all URIs are caused by viruses. A virus is a type of germ and can spread from one person to another.  RISKS FACTORS You may be at risk for a URI if:   You smoke.   You have chronic heart or lung disease.  You have a weakened defense (immune) system.   You are very young or very old.   You have nasal allergies or asthma.  You work in crowded or poorly ventilated areas.  You work in health care facilities or schools. SIGNS AND SYMPTOMS  Symptoms typically develop 2-3 days after you come in contact with a cold virus. Most viral URIs last 7-10 days. However, viral URIs from the influenza virus (flu virus) can last 14-18 days and are typically more severe. Symptoms may include:   Runny or stuffy (congested) nose.   Sneezing.   Cough.   Sore throat.   Headache.   Fatigue.   Fever.   Loss of appetite.   Pain  in your forehead, behind your eyes, and over your cheekbones (sinus pain).  Muscle aches.  DIAGNOSIS  Your health care provider may diagnose a URI by:  Physical exam.  Tests to check that your symptoms are not due to another condition such as:  Strep throat.  Sinusitis.  Pneumonia.  Asthma. TREATMENT  A URI goes away on its own with time. It cannot be cured with medicines, but medicines may be prescribed or recommended to relieve symptoms. Medicines may help:  Reduce your fever.  Reduce your cough.  Relieve nasal congestion. HOME CARE INSTRUCTIONS   Take medicines only as directed by your health care provider.   Gargle warm saltwater or take cough drops to comfort your throat as directed by your health care provider.  Use a warm mist humidifier or inhale steam from a shower to increase air moisture. This may make it easier to breathe.  Drink enough fluid to keep your urine clear or pale yellow.   Eat soups and other clear broths and maintain good nutrition.   Rest as needed.   Return to work when your temperature has returned to normal or as your health care provider advises. You may need to stay home longer to avoid infecting others. You can also use a face mask and careful hand washing to prevent spread of the virus.  Increase the usage of your inhaler if you have asthma.   Do not use any tobacco products, including cigarettes, chewing tobacco, or electronic  cigarettes. If you need help quitting, ask your health care provider. PREVENTION  The best way to protect yourself from getting a cold is to practice good hygiene.   Avoid oral or hand contact with people with cold symptoms.   Wash your hands often if contact occurs.  There is no clear evidence that vitamin C, vitamin E, echinacea, or exercise reduces the chance of developing a cold. However, it is always recommended to get plenty of rest, exercise, and practice good nutrition.  SEEK MEDICAL CARE IF:     You are getting worse rather than better.   Your symptoms are not controlled by medicine.   You have chills.  You have worsening shortness of breath.  You have brown or red mucus.  You have yellow or brown nasal discharge.  You have pain in your face, especially when you bend forward.  You have a fever.  You have swollen neck glands.  You have pain while swallowing.  You have white areas in the back of your throat. SEEK IMMEDIATE MEDICAL CARE IF:   You have severe or persistent:  Headache.  Ear pain.  Sinus pain.  Chest pain.  You have chronic lung disease and any of the following:  Wheezing.  Prolonged cough.  Coughing up blood.  A change in your usual mucus.  You have a stiff neck.  You have changes in your:  Vision.  Hearing.  Thinking.  Mood. MAKE SURE YOU:   Understand these instructions.  Will watch your condition.  Will get help right away if you are not doing well or get worse.   This information is not intended to replace advice given to you by your health care provider. Make sure you discuss any questions you have with your health care provider.   Document Released: 05/27/2001 Document Revised: 04/17/2015 Document Reviewed: 03/08/2014 Elsevier Interactive Patient Education [employer].

## 2016-02-07 ENCOUNTER — Ambulatory Visit: Payer: 59 | Admitting: Neurology

## 2016-02-18 ENCOUNTER — Ambulatory Visit (INDEPENDENT_AMBULATORY_CARE_PROVIDER_SITE_OTHER): Payer: 59 | Admitting: Physician Assistant

## 2016-02-18 ENCOUNTER — Ambulatory Visit: Payer: 59 | Admitting: Physician Assistant

## 2016-02-18 ENCOUNTER — Encounter: Payer: Self-pay | Admitting: Physician Assistant

## 2016-02-18 VITALS — BP 111/70 | HR 71 | Temp 98.2°F | Ht 65.0 in | Wt 151.6 lb

## 2016-02-18 DIAGNOSIS — K219 Gastro-esophageal reflux disease without esophagitis: Secondary | ICD-10-CM | POA: Diagnosis not present

## 2016-02-18 DIAGNOSIS — E042 Nontoxic multinodular goiter: Secondary | ICD-10-CM

## 2016-02-18 NOTE — Assessment & Plan Note (Signed)
One nodule right lobe and left lobe. Exam good today. No new palpable nodules. Will repeat Thyroid US as scheduled. Follow-up 6 months for repeat examination.

## 2016-02-18 NOTE — Assessment & Plan Note (Signed)
Flare-up. Will add-on OTC prilosec once daily for 1 week. Daily probiotic. Continue Zantac. Follow-up if symptoms are not calming back down.

## 2016-02-18 NOTE — Progress Notes (Signed)
Pre visit review using our clinic review tool, if applicable. No additional management support is needed unless otherwise documented below in the visit note. 

## 2016-02-18 NOTE — Patient Instructions (Signed)
Thyroid exam today looks good. Do not feel any more palpable nodules. We will repeat your [place] this December. Follow-up with me in 6 months for repeat exam.  Please continue acid reflux medication as directed by add on an over-the-counter prilosec daily for 1 week to help calm things down. Also consider adding on a daily probiotic. Follow-up if symptoms are not resolving.

## 2016-02-18 NOTE — Progress Notes (Signed)
Patient presents to clinic today for follow-up exam to reassess thyroid nodules. US obtained in December revealing tiny nodules bilaterally. Thyroid function within normal limits at that time. Denies new or worsening symptoms.   Patient endorses flare-up of GERD symptoms over the past week. Denies change to dietary regimen. Is taking Zantac 150 mg BID. Denies abdominal pain, nausea or vomiting.   Past Medical History  Diagnosis Date  . Diverticulosis of colon (without mention of hemorrhage)   . Esophageal reflux   . Other symptoms involving digestive system(787.99)   . Pain in joint, shoulder region   . Abdominal pain, right lower quadrant   . Backache, unspecified   . Acute bronchospasm   . External hemorrhoids without mention of complication     thrombosed  . Inguinal hernia without mention of obstruction or gangrene, unilateral or unspecified, (not specified as recurrent)   . Pain in joint, forearm   . Thoracic or lumbosacral neuritis or radiculitis, unspecified   . Obstructive sleep apnea (adult) (pediatric)   . Hiatal hernia   . Migraine   . Chicken pox   . Seasonal allergies     Current Outpatient Prescriptions on File Prior to Visit  Medication Sig Dispense Refill  . aspirin 81 MG tablet Take 81 mg by mouth daily.    . benzonatate (TESSALON) 100 MG capsule Take 1 capsule (100 mg total) by mouth 2 (two) times daily as needed for cough. 20 capsule 0  . fluticasone (FLONASE) 50 MCG/ACT nasal spray Place 2 sprays into both nostrils daily. 16 g 6  . gabapentin (NEURONTIN) 100 MG capsule Take 1 capsule every morning, 1 capsule at noon, and 2 capsules at bedtime 120 capsule 4  . LORazepam (ATIVAN) 1 MG tablet Take 1 tablet (1 mg total) by mouth 2 (two) times daily. 60 tablet 1  . ranitidine (ZANTAC) 150 MG tablet Take 1 tablet (150 mg total) by mouth 2 (two) times daily. 60 tablet 5  . tadalafil (CIALIS) 20 MG tablet Take 1 tablet (20 mg total) by mouth daily as needed for  erectile dysfunction. 10 tablet 0   No current facility-administered medications on file prior to visit.    Allergies  Allergen Reactions  . Escitalopram Hives    Pt felt anxious  Pt felt his skin was "on fire"  . Protonix [Pantoprazole Sodium] Other (See Comments)    Tingling of extremities    Family History  Problem Relation Age of Onset  . Diabetes Maternal Aunt   . Lung cancer Mother 74    Deceased  . Colon cancer Neg Hx   . Other Father     MVA  . Heart disease Paternal Grandmother   . Heart attack Paternal Grandmother   . Healthy Brother     x2  . Aneurysm Sister     Brain  . Allergies Daughter     x2  . Migraines Daughter     x1    Social History   Social History  . Marital Status: Married    Spouse Name: N/A  . Number of Children: N/A  . Years of Education: N/A   Occupational History  . Technical sales engineer     Social History Main Topics  . Smoking status: Former Smoker    Types: Cigarettes, Cigars    Quit date: 10/04/1987  . Smokeless tobacco: Never Used  . Alcohol Use: 4.2 - 8.4 oz/week    7-14 Standard drinks or equivalent per week  Comment: 2 daily   . Drug Use: No  . Sexual Activity: Not Asked   Other Topics Concern  . None   Social History Narrative   1 caffeine drink daily  Lives with wife in a one story home.  Has 2 children.  Works in Estate manager/land agent.   Review of Systems - See HPI.  All other ROS are negative.  BP 111/70 mmHg  Pulse 71  Temp(Src) 98.2 F (36.8 C) (Oral)  Ht 5\' 5"  (1.651 m)  Wt 151 lb 9.6 oz (68.765 kg)  BMI 25.23 kg/m2  SpO2 98%  Physical Exam  Constitutional: He is oriented to person, place, and time and well-developed, well-nourished, and in no distress.  HENT:  Head: Normocephalic and atraumatic.  Eyes: Conjunctivae are normal.  Neck: Neck supple. No thyromegaly present.  Cardiovascular: Normal rate, regular rhythm, normal heart sounds and intact distal pulses.   Pulmonary/Chest: Effort normal  and breath sounds normal. No respiratory distress. He has no wheezes. He has no rales. He exhibits no tenderness.  Neurological: He is alert and oriented to person, place, and time.  Skin: Skin is warm and dry. No rash noted.  Psychiatric: Affect normal.  Vitals reviewed.  Assessment/Plan: Multiple thyroid nodules One nodule right lobe and left lobe. Exam good today. No new palpable nodules. Will repeat Thyroid US as scheduled. Follow-up 6 months for repeat examination.  Esophageal reflux Flare-up. Will add-on OTC prilosec once daily for 1 week. Daily probiotic. Continue Zantac. Follow-up if symptoms are not calming back down.

## 2016-02-25 MED FILL — GABAPENTIN 100 MG CAPSULE: 100 | 30 days supply | Qty: 120 | Fill #1

## 2016-02-26 MED FILL — SM ACID REDUCER 150 MG TAB: 150 MG | 33 days supply | Qty: 65 | Fill #1

## 2016-03-25 MED FILL — GABAPENTIN 100 MG CAPSULE: 100 | 30 days supply | Qty: 120 | Fill #2

## 2016-03-26 MED FILL — SM ACID REDUCER 150 MG TAB: 150 MG | 33 days supply | Qty: 65 | Fill #2

## 2016-03-31 ENCOUNTER — Encounter: Payer: Self-pay | Admitting: Physician Assistant

## 2016-03-31 ENCOUNTER — Ambulatory Visit (INDEPENDENT_AMBULATORY_CARE_PROVIDER_SITE_OTHER): Payer: 59 | Admitting: Physician Assistant

## 2016-03-31 VITALS — BP 118/72 | HR 72 | Temp 98.0°F | Ht 65.0 in | Wt 151.5 lb

## 2016-03-31 DIAGNOSIS — M4306 Spondylolysis, lumbar region: Secondary | ICD-10-CM

## 2016-03-31 MED ORDER — CELECOXIB 200 MG PO CAPS: 200.0000 mg | ORAL_CAPSULE | Freq: Every day | ORAL | Status: DC

## 2016-03-31 MED FILL — CELECOXIB 200 MG CAPSULE: 200 | 30 days supply | Qty: 30 | Fill #0

## 2016-03-31 NOTE — Progress Notes (Signed)
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  Patient previously on Celebrex given by a previous provider. Endorses improvement in symptoms with medication. Would like to consider restarting. Also would like to discuss returning to physical therapy for further treatment.    Past Medical History  Diagnosis Date  . Diverticulosis of colon (without mention of hemorrhage)   . Esophageal reflux   . Other symptoms involving digestive system(787.99)   . Pain in joint, shoulder region   . Abdominal pain, right lower quadrant   . Backache, unspecified   . Acute bronchospasm   . External hemorrhoids without mention of complication     thrombosed  . Inguinal hernia without mention of obstruction or gangrene, unilateral or unspecified, (not specified as recurrent)   . Pain in joint, forearm   . Thoracic or lumbosacral neuritis or radiculitis, unspecified   . Obstructive sleep apnea (adult) (pediatric)   . Hiatal hernia   . Migraine   . Chicken pox   . Seasonal allergies     Current Outpatient Prescriptions on File Prior to Visit  Medication Sig Dispense Refill  . aspirin 81 MG tablet Take 81 mg by mouth daily.    [name] gabapentin (NEURONTIN) 100 MG capsule Take 1 capsule every morning, 1 capsule at noon, and 2 capsules at bedtime 120 capsule 4  . LORazepam (ATIVAN) 1 MG tablet Take 1 tablet (1 mg total) by mouth 2 (two) times daily. 60 tablet 1  . ranitidine (ZANTAC) 150 MG tablet Take 1 tablet (150 mg total) by mouth 2 (two) times daily. 60 tablet 5  . tadalafil (CIALIS) 20 MG tablet Take 1 tablet (20 mg total) by mouth daily as needed for erectile dysfunction. 10 tablet 0   No current facility-administered medications on file prior to visit.    Allergies  Allergen Reactions  . Escitalopram Hives    Pt felt anxious  Pt felt his skin was "on fire"  . Protonix [Pantoprazole Sodium]  Other (See Comments)    Tingling of extremities    Family History  Problem Relation Age of Onset  . Diabetes Maternal Aunt   . Lung cancer Mother 72    Deceased  . Colon cancer Neg Hx   . Other Father     MVA  . Heart disease Paternal Grandmother   . Heart attack Paternal Grandmother   . Healthy Brother     x2  . Aneurysm Sister     Brain  . Allergies Daughter     x2  . Migraines Daughter     x1    Social History   Social History  . Marital Status: Married    Spouse Name: N/A  . Number of Children: N/A  . Years of Education: N/A   Occupational History  . Technical sales engineer     Social History Main Topics  . Smoking status: Former Smoker    Types: Cigarettes, Cigars    Quit date: 10/04/1987  . Smokeless tobacco: Never Used  . Alcohol Use: 4.2 - 8.4 oz/week    7-14 Standard drinks or equivalent per week     Comment: 2 daily   . Drug Use: No  . Sexual Activity: Not Asked   Other Topics Concern  . None   Social History Narrative   1 caffeine drink daily  Lives with wife in a one story home.  Has 2 children.  Works in  Estate manager/land agent.    Review of Systems - See HPI.  All other ROS are negative.  BP 118/72 mmHg  Pulse 72  Temp(Src) 98 F (36.7 C) (Oral)  Ht 5\' 5"  (1.651 m)  Wt 151 lb 8 oz (68.72 kg)  BMI 25.21 kg/m2  SpO2 98%  Physical Exam  Constitutional: He is well-developed, well-nourished, and in no distress.  HENT:  Head: Normocephalic and atraumatic.  Eyes: Conjunctivae are normal.  Cardiovascular: Normal rate and regular rhythm.   Pulmonary/Chest: Effort normal.  Skin: Skin is warm and dry. No rash noted.  Psychiatric: Affect normal.  Vitals reviewed.   No results found for this or any previous visit (from the past 2160 hour(s)).  Assessment/Plan: 1. Pars defect of lumbar spine Referral to PT placed for management. Will restart Celebrex once daily. Supportive measures reviewed. - Ambulatory referral to Physical Therapy -  celecoxib (CELEBREX) 200 MG capsule; Take 1 capsule (200 mg total) by mouth daily.  Dispense: 30 capsule; Refill: 1

## 2016-03-31 NOTE — Patient Instructions (Signed)
Please start the Celebrex. Use the voucher I have given you. You will be contacted for physical therapy.  Let me know if you do not hear from them this week.

## 2016-03-31 NOTE — Progress Notes (Signed)
Pre visit review using our clinic review tool, if applicable. No additional management support is needed unless otherwise documented below in the visit note. 

## 2016-04-09 ENCOUNTER — Telehealth: Payer: Self-pay | Admitting: *Deleted

## 2016-04-09 NOTE — Telephone Encounter (Signed)
Forwarded to [place]. JG//CMA 

## 2016-04-14 NOTE — Telephone Encounter (Signed)
Signed forms faxed to [facility] successfully. Sent for scanning. JG//CMA

## 2016-04-24 MED FILL — GABAPENTIN 100 MG CAPSULE: 100 | 30 days supply | Qty: 120 | Fill #3

## 2016-04-24 MED FILL — SM ACID REDUCER 150 MG TAB: 150 MG | 32 days supply | Qty: 65 | Fill #3

## 2016-05-26 ENCOUNTER — Other Ambulatory Visit: Payer: Self-pay | Admitting: Physician Assistant

## 2016-05-26 MED FILL — GABAPENTIN 100 MG CAPSULE: 100 | 30 days supply | Qty: 120 | Fill #4

## 2016-05-26 MED FILL — raNITIdine HCL 150 MG TABS: 150 | 30 days supply | Qty: 60 | Fill #0

## 2016-05-26 NOTE — Telephone Encounter (Signed)
Refill sent per [facility] refill protocol/SLS  

## 2016-06-02 ENCOUNTER — Encounter: Payer: Self-pay | Admitting: Neurology

## 2016-06-02 ENCOUNTER — Ambulatory Visit (INDEPENDENT_AMBULATORY_CARE_PROVIDER_SITE_OTHER): Payer: 59 | Admitting: Neurology

## 2016-06-02 VITALS — BP 116/74 | HR 70 | Ht 61.0 in | Wt 151.0 lb

## 2016-06-02 DIAGNOSIS — R42 Dizziness and giddiness: Secondary | ICD-10-CM | POA: Diagnosis not present

## 2016-06-02 DIAGNOSIS — R202 Paresthesia of skin: Secondary | ICD-10-CM | POA: Diagnosis not present

## 2016-06-02 DIAGNOSIS — G6289 Other specified polyneuropathies: Secondary | ICD-10-CM | POA: Diagnosis not present

## 2016-06-02 DIAGNOSIS — R200 Anesthesia of skin: Secondary | ICD-10-CM

## 2016-06-02 MED ORDER — GABAPENTIN 100 MG PO CAPS: ORAL_CAPSULE | ORAL | Status: DC

## 2016-06-02 NOTE — Progress Notes (Signed)
NEUROLOGY FOLLOW UP OFFICE NOTE  Curtis Luna [id number]  HISTORY OF PRESENT ILLNESS: Curtis Luna is a 55 year old right-handed man with migraines and back pain who follows up for vertigo and paresthesias.    UPDATE: He is taking gabapentin 100mg  in AM, 100mg  at 4pm and 200mg  at bedtime, which has been effective until about 4 months ago. Further labs, including 2 hour glucose tolerance test and TSH of 1.28 were normal. Celiac panel and SSA/SSB antibodies were negative.  A couple of months ago, he had an upper respiratory infection.  He took pseudoephedrine which helped his dizziness.  HISTORY: Since April 2014, he has experienced episodic vertigo which correlates with BPPV.  When it gets severe, he uses a modified Epley maneuver, which helps.  However, a few years ago, he has developed a chronic "swimmy" feeling  in his head.  It seems to be exacerbated with any movement of his head, such as scanning the food aisles in the grocery store or cooking in the kitchen where he has to keep turning back and forth from the stove or refrigerator to the counter.  He also notices it if he turns over in bed from right to left.  Also, he notices it when he is walking.  He has had testing by ENT, which has been unremarkable.  He reportedly had an MRI of the brain which was unremarkable.  He was given a medication by the ENT, possibly Diamox, which was ineffective.  He saw another neurologist, who prescribed Lexapro, however he had an allergic reaction to this.    He reports that both his brother and sister have been diagnosed with cerebral aneurysm, so MRA of the head was performed on 04/14/15, which was normal, without evidence of aneurysm or vertebro-basilar stenosis.  Since March 2016, he has noted numbness and tingling involving the ankles and ventral aspect of his wrist and forearms.  It started after he switched from Nexium to Protonix.  He stopped the Protonix and started taking 50 mcg pill of B12,  which helped.  However the symptoms returned.  He visits [place], where he is originally from, in the summer where he has been exposed to ticks, but not recently.  He denies any rash or arthralgias.  He denies dry eyes, dry mouth, lightheadedness, abnormal sweating, or GI symptoms such as early satiety, diarrhea or constipation.  Hgb A1c was 5.4.  TSH 1.79, Sed Rate 5.  B12 603.   ANA, B burgdorferi antibodies, and SPEP/UPEP/IFE, were unremarkable. NCV-EMG from 05/22/15 showed chronic bilateral L5 radiculopathy and mild right carpal tunnel syndrome, but no polyneuropathy.  He also has ocular migraines, where he develops kaleidoscope vision in his periphery, lasting 20 to 60 minutes and without headache.  It occurs about every 3 to 4 months.  PAST MEDICAL HISTORY: Past Medical History  Diagnosis Date  . Diverticulosis of colon (without mention of hemorrhage)   . Esophageal reflux   . Other symptoms involving digestive system(787.99)   . Pain in joint, shoulder region   . Abdominal pain, right lower quadrant   . Backache, unspecified   . Acute bronchospasm   . External hemorrhoids without mention of complication     thrombosed  . Inguinal hernia without mention of obstruction or gangrene, unilateral or unspecified, (not specified as recurrent)   . Pain in joint, forearm   . Thoracic or lumbosacral neuritis or radiculitis, unspecified   . Obstructive sleep apnea (adult) (pediatric)   . Hiatal hernia   .  Migraine   . Chicken pox   . Seasonal allergies     MEDICATIONS: Current Outpatient Prescriptions on File Prior to Visit  Medication Sig Dispense Refill  . aspirin 81 MG tablet Take 81 mg by mouth daily.    . celecoxib (CELEBREX) 200 MG capsule Take 1 capsule (200 mg total) by mouth daily. 30 capsule 1  . LORazepam (ATIVAN) 1 MG tablet Take 1 tablet (1 mg total) by mouth 2 (two) times daily. 60 tablet 1  . ranitidine (ZANTAC) 150 MG tablet TAKE [1] TABLET BY MOUTH TWICE DAILY 60  tablet 3  . tadalafil (CIALIS) 20 MG tablet Take 1 tablet (20 mg total) by mouth daily as needed for erectile dysfunction. 10 tablet 0   No current facility-administered medications on file prior to visit.    ALLERGIES: Allergies  Allergen Reactions  . Escitalopram Hives    Pt felt anxious  Pt felt his skin was "on fire"  . Protonix [Pantoprazole Sodium] Other (See Comments)    Tingling of extremities    FAMILY HISTORY: Family History  Problem Relation Age of Onset  . Diabetes Maternal Aunt   . Lung cancer Mother 39    Deceased  . Colon cancer Neg Hx   . Other Father     MVA  . Heart disease Paternal Grandmother   . Heart attack Paternal Grandmother   . Healthy Brother     x2  . Aneurysm Sister     Brain  . Allergies Daughter     x2  . Migraines Daughter     x1    SOCIAL HISTORY: Social History   Social History  . Marital Status: Married    Spouse Name: N/A  . Number of Children: N/A  . Years of Education: N/A   Occupational History  . Technical sales engineer     Social History Main Topics  . Smoking status: Former Smoker    Types: Cigarettes, Cigars    Quit date: 10/04/1987  . Smokeless tobacco: Never Used  . Alcohol Use: 4.2 - 8.4 oz/week    7-14 Standard drinks or equivalent per week     Comment: 2 daily   . Drug Use: No  . Sexual Activity: Not on file   Other Topics Concern  . Not on file   Social History Narrative   1 caffeine drink daily  Lives with wife in a one story home.  Has 2 children.  Works in Estate manager/land agent.    REVIEW OF SYSTEMS: Constitutional: No fevers, chills, or sweats, no generalized fatigue, change in appetite Eyes: No visual changes, double vision, eye pain Ear, nose and throat: No hearing loss, ear pain, nasal congestion, sore throat Cardiovascular: No chest pain, palpitations Respiratory:  No shortness of breath at rest or with exertion, wheezes GastrointestinaI: No nausea, vomiting, diarrhea, abdominal pain, fecal  incontinence Genitourinary:  No dysuria, urinary retention or frequency Musculoskeletal:  No neck pain, back pain Integumentary: No rash, pruritus, skin lesions Neurological: as above Psychiatric: No depression, insomnia, anxiety Endocrine: No palpitations, fatigue, diaphoresis, mood swings, change in appetite, change in weight, increased thirst Hematologic/Lymphatic:  No purpura, petechiae. Allergic/Immunologic: no itchy/runny eyes, nasal congestion, recent allergic reactions, rashes  PHYSICAL EXAM: Filed Vitals:   06/02/16 0919  BP: 116/74  Pulse: 70   General: No acute distress.  Patient appears well-groomed.  normal body habitus. Head:  Normocephalic/atraumatic Eyes:  Fundi examined but not visualized Neck: supple, no paraspinal tenderness, full range of motion Heart:  Regular rate  and rhythm Lungs:  Clear to auscultation bilaterally Back: No paraspinal tenderness Neurological Exam: alert and oriented to person, place, and time. Attention span and concentration intact, recent and remote memory intact, fund of knowledge intact.  Speech fluent and not dysarthric, language intact.  CN II-XII intact. Bulk and tone normal, muscle strength 5/5 throughout.  Sensation to pinprick and vibration intact.  Deep tendon reflexes 2+ throughout, toes downgoing.  Finger to nose and heel to shin testing intact.  Gait normal, Romberg negative.  IMPRESSION: Vertigo/dizziness.  He does have symptoms of occasional BPPV, but he appears to have chronic subjective dizziness as well.   I don't think there is an associated sinus issue causing it, as this is chronic and he was evaluated by ENT. Paresthesias.  Consider small fiber neuropathy  PLAN: 1.  Will refer to vestibular rehab to see if any exercises may be helpful. 2.  Increase gabapentin to 200mg  three times daily. 3.  F/u in one year or as needed.  26 minutes spent face to face with patient, over 50% spent counseling.  Metta Clines, DO  CC:    Cherlynn Perches

## 2016-06-02 NOTE — Patient Instructions (Addendum)
1.  Increase gabapentin to 2 capsules three times daily 2.  Refer to vestibular rehab 3.  Follow up in one year or as needed.

## 2016-06-20 MED FILL — GABAPENTIN 100 MG CAPSULE: 100 | 30 days supply | Qty: 180 | Fill #0

## 2016-06-20 MED FILL — CELECOXIB 200 MG CAPSULE: 200 | 30 days supply | Qty: 30 | Fill #1

## 2016-07-28 MED FILL — raNITIdine HCL 150 MG TABS: 150 | 30 days supply | Qty: 60 | Fill #1

## 2016-07-28 MED FILL — GABAPENTIN 100 MG CAPSULE: 100 | 30 days supply | Qty: 180 | Fill #1

## 2016-08-12 ENCOUNTER — Encounter: Payer: Self-pay | Admitting: Physician Assistant

## 2016-08-12 ENCOUNTER — Ambulatory Visit (INDEPENDENT_AMBULATORY_CARE_PROVIDER_SITE_OTHER): Payer: 59 | Admitting: Physician Assistant

## 2016-08-12 VITALS — BP 110/70 | HR 62 | Temp 97.6°F | Resp 16 | Ht 61.0 in | Wt 154.2 lb

## 2016-08-12 DIAGNOSIS — G5712 Meralgia paresthetica, left lower limb: Secondary | ICD-10-CM | POA: Diagnosis not present

## 2016-08-12 MED ORDER — METHYLPREDNISOLONE 4 MG PO TBPK: ORAL_TABLET | ORAL | 0 refills | Status: DC

## 2016-08-12 MED FILL — METHYLPREDNISOLONE 4 MG TAB: 4 | 6 days supply | Qty: 21 | Fill #0

## 2016-08-12 NOTE — Progress Notes (Signed)
Patient presents to clinic today c/o numbness and tingling of left anterolateral thigh over the past 2 weeks. Denies trauma or injury. Denies swelling or discoloration of leg. Has history of idiopathic peripheral neuropathy, followed by Neurology, but states this feels different. Denies back pain or symptoms of RLE.  Past Medical History:  Diagnosis Date  . Abdominal pain, right lower quadrant   . Acute bronchospasm   . Backache, unspecified   . Chicken pox   . Diverticulosis of colon (without mention of hemorrhage)   . Esophageal reflux   . External hemorrhoids without mention of complication    thrombosed  . Hiatal hernia   . Inguinal hernia without mention of obstruction or gangrene, unilateral or unspecified, (not specified as recurrent)   . Migraine   . Obstructive sleep apnea (adult) (pediatric)   . Other symptoms involving digestive system(787.99)   . Pain in joint, forearm   . Pain in joint, shoulder region   . Seasonal allergies   . Thoracic or lumbosacral neuritis or radiculitis, unspecified     Current Outpatient Prescriptions on File Prior to Visit  Medication Sig Dispense Refill  . aspirin 81 MG tablet Take 81 mg by mouth daily.    . celecoxib (CELEBREX) 200 MG capsule Take 1 capsule (200 mg total) by mouth daily. (Patient taking differently: Take 200 mg by mouth daily as needed. ) 30 capsule 1  . gabapentin (NEURONTIN) 100 MG capsule Take 2 capsule three times daily. 540 capsule 3  . LORazepam (ATIVAN) 1 MG tablet Take 1 tablet (1 mg total) by mouth 2 (two) times daily. (Patient taking differently: Take 1 mg by mouth 2 (two) times daily as needed. ) 60 tablet 1  . ranitidine (ZANTAC) 150 MG tablet TAKE [1] TABLET BY MOUTH TWICE DAILY 60 tablet 3  . tadalafil (CIALIS) 20 MG tablet Take 1 tablet (20 mg total) by mouth daily as needed for erectile dysfunction. 10 tablet 0   No current facility-administered medications on file prior to visit.     Allergies  Allergen  Reactions  . Escitalopram Hives    Pt felt anxious  Pt felt his skin was "on fire"  . Protonix [Pantoprazole Sodium] Other (See Comments)    Tingling of extremities    Family History  Problem Relation Age of Onset  . Diabetes Maternal Aunt   . Lung cancer Mother 46    Deceased  . Colon cancer Neg Hx   . Other Father     MVA  . Heart disease Paternal Grandmother   . Heart attack Paternal Grandmother   . Healthy Brother     x2  . Aneurysm Sister     Brain  . Allergies Daughter     x2  . Migraines Daughter     x1    Social History   Social History  . Marital status: Married    Spouse name: N/A  . Number of children: N/A  . Years of education: N/A   Occupational History  . Technical sales engineer     Social History Main Topics  . Smoking status: Former Smoker    Types: Cigarettes, Cigars    Quit date: 10/04/1987  . Smokeless tobacco: Never Used  . Alcohol use 4.2 - 8.4 oz/week    7 - 14 Standard drinks or equivalent per week     Comment: 2 daily   . Drug use: No  . Sexual activity: Not Asked   Other Topics Concern  . None  Social History Narrative   1 caffeine drink daily  Lives with wife in a one story home.  Has 2 children.  Works in Estate manager/land agent.   Review of Systems - See HPI.  All other ROS are negative.  BP 110/70 (BP Location: Right Arm, Patient Position: Sitting, Cuff Size: Normal)   Pulse 62   Temp 97.6 F (36.4 C) (Oral)   Resp 16   Ht 5\' 1"  (1.549 m)   Wt 154 lb 4 oz (70 kg)   SpO2 98%   BMI 29.15 kg/m   Physical Exam  Constitutional: He is oriented to person, place, and time and well-developed, well-nourished, and in no distress.  HENT:  Head: Normocephalic and atraumatic.  Eyes: Conjunctivae are normal.  Cardiovascular: Normal rate, regular rhythm, normal heart sounds and intact distal pulses.   Pulmonary/Chest: Effort normal.  Musculoskeletal: Normal range of motion.  Neurological: He is alert and oriented to person, place,  and time.  Decreased sensation noted of anterolateral left thigh. No skin changes noted. Pulses and reflexes intact.  Skin: Skin is warm and dry. No rash noted.  Vitals reviewed.  Assessment/Plan: 1. Meralgia paresthetica of left side Supportive measures reviewed. Loose-fitting clothing recommended. Symptoms are improving per patient. Will continue Gabapentin at current dosing. Rx Medrol pack. FU if symptoms are not continuing to resolve.   Leeanne Rio, PA-C

## 2016-08-12 NOTE — Patient Instructions (Signed)
Please take the steroid pack as directed. Continue Gabapentin as directed by Dr. Tomi Likens. Avoid laying or leaning on the affected side. Follow-up if symptoms do not continue to resolve.

## 2016-09-01 MED FILL — GABAPENTIN 100 MG CAPSULE: 100 | 30 days supply | Qty: 180 | Fill #2

## 2016-09-01 MED FILL — raNITIdine HCL 150 MG TABS: 150 | 30 days supply | Qty: 60 | Fill #2

## 2016-09-26 ENCOUNTER — Telehealth: Payer: Self-pay | Admitting: Physician Assistant

## 2016-09-26 NOTE — Telephone Encounter (Signed)
ok 

## 2016-09-26 NOTE — Telephone Encounter (Signed)
Patient would like to transfer from [facility] to Dr. Lorelei Pont Please advise

## 2016-09-28 NOTE — Telephone Encounter (Signed)
Ok with me 

## 2016-09-29 NOTE — Telephone Encounter (Signed)
Patient scheduled for 11/13/2016

## 2016-10-13 MED FILL — raNITIdine HCL 150 MG TABS: 150 | 30 days supply | Qty: 60 | Fill #3

## 2016-10-13 MED FILL — GABAPENTIN 100 MG CAPSULE: 100 | 30 days supply | Qty: 180 | Fill #3

## 2016-11-10 ENCOUNTER — Encounter: Payer: 59 | Admitting: Physician Assistant

## 2016-11-11 ENCOUNTER — Other Ambulatory Visit: Payer: Self-pay | Admitting: Physician Assistant

## 2016-11-11 MED FILL — raNITIdine HCL 150 MG TABS: 150 | 30 days supply | Qty: 60 | Fill #0

## 2016-11-11 MED FILL — GABAPENTIN 100 MG CAPSULE: 100 | 30 days supply | Qty: 180 | Fill #4

## 2016-11-12 ENCOUNTER — Telehealth: Payer: Self-pay | Admitting: Neurology

## 2016-11-12 NOTE — Telephone Encounter (Signed)
Pt asked that vestibular rehab referral be placed to [facility] instead of [facility]. Was faxed to [facility] @ [phone]

## 2016-11-12 NOTE — Telephone Encounter (Signed)
PT called in regards to a referral/Dawn CB# [phone]

## 2016-11-13 ENCOUNTER — Ambulatory Visit (INDEPENDENT_AMBULATORY_CARE_PROVIDER_SITE_OTHER): Payer: 59 | Admitting: Family Medicine

## 2016-11-13 ENCOUNTER — Encounter: Payer: Self-pay | Admitting: Family Medicine

## 2016-11-13 VITALS — BP 126/75 | HR 88 | Temp 97.8°F | Ht 66.0 in | Wt 158.0 lb

## 2016-11-13 DIAGNOSIS — Z131 Encounter for screening for diabetes mellitus: Secondary | ICD-10-CM

## 2016-11-13 DIAGNOSIS — Z119 Encounter for screening for infectious and parasitic diseases, unspecified: Secondary | ICD-10-CM | POA: Diagnosis not present

## 2016-11-13 DIAGNOSIS — Z13220 Encounter for screening for lipoid disorders: Secondary | ICD-10-CM

## 2016-11-13 DIAGNOSIS — Z125 Encounter for screening for malignant neoplasm of prostate: Secondary | ICD-10-CM

## 2016-11-13 DIAGNOSIS — Z130 Encounter for screening for diseases of the blood and blood-forming organs and certain disorders involving the immune mechanism: Secondary | ICD-10-CM

## 2016-11-13 DIAGNOSIS — R42 Dizziness and giddiness: Secondary | ICD-10-CM

## 2016-11-13 LAB — CBC
HCT: 42.8 % (ref 39.0–52.0)
Hemoglobin: 14.6 g/dL (ref 13.0–17.0)
MCHC: 34.1 g/dL (ref 30.0–36.0)
MCV: 92.3 fl (ref 78.0–100.0)
Platelets: 321 10*3/uL (ref 150.0–400.0)
RBC: 4.64 Mil/uL (ref 4.22–5.81)
RDW: 14.2 % (ref 11.5–15.5)
WBC: 12.3 10*3/uL — ABNORMAL HIGH (ref 4.0–10.5)

## 2016-11-13 LAB — COMPREHENSIVE METABOLIC PANEL
ALT: 19 U/L (ref 0–53)
AST: 21 U/L (ref 0–37)
Albumin: 4.4 g/dL (ref 3.5–5.2)
Alkaline Phosphatase: 49 U/L (ref 39–117)
BUN: 16 mg/dL (ref 6–23)
CO2: 30 mEq/L (ref 19–32)
Calcium: 9.7 mg/dL (ref 8.4–10.5)
Chloride: 103 mEq/L (ref 96–112)
Creatinine, Ser: 1.03 mg/dL (ref 0.40–1.50)
GFR: 79.26 mL/min (ref >60.00)
Glucose, Bld: 74 mg/dL (ref 70–99)
Potassium: 4.7 mEq/L (ref 3.5–5.1)
Sodium: 141 mEq/L (ref 135–145)
Total Bilirubin: 0.4 mg/dL (ref 0.2–1.2)
Total Protein: 6.9 g/dL (ref 6.0–8.3)

## 2016-11-13 LAB — LIPID PANEL
Cholesterol: 218 mg/dL — ABNORMAL HIGH (ref 0–200)
HDL: 44.1 mg/dL (ref >39.00)
LDL Cholesterol: 141 mg/dL — ABNORMAL HIGH (ref 0–99)
NonHDL: 174.04
Total CHOL/HDL Ratio: 5
Triglycerides: 165 mg/dL — ABNORMAL HIGH (ref 0.0–149.0)
VLDL: 33 mg/dL (ref 0.0–40.0)

## 2016-11-13 LAB — HEMOGLOBIN A1C: Hgb A1c MFr Bld: 5.7 % (ref 4.6–6.5)

## 2016-11-13 LAB — PSA: PSA: 0.79 ng/mL (ref 0.10–4.00)

## 2016-11-13 NOTE — Patient Instructions (Addendum)
It was very nice to meet you today.  We will get your labs and I will be in touch with your results asap I think it would be fine to try an allegra D or claritin D for your vertigo symptoms; try taking one a day for the next 1-2 weeks, and if helpful you can continue to use it

## 2016-11-13 NOTE — Progress Notes (Addendum)
[place] at [facility] [address] [place], [place] [address] [phone]  Date:  11/13/2016   Name:  Curtis Luna   DOB:  [birth year]   MRN:  [id number]  PCP:  Leeanne Rio, PA-C    Chief Complaint: No chief complaint on file.   History of Present Illness:  Curtis Luna is a 55 y.o. very pleasant male patient who presents with the following:  Here today as a new patient to establish care with myself now that [place] has moved to a new location. History of anxiety, meralgia paresthetica Due for a flu shot, hep C screening.  Declines a flu shot today Tdap is UTD Colonoscopy is UTD Last labs are from a year ago- he is fasting today for labs.  He states that his cholesterol is always "right on the edge."  He tries to keep this in check with diet and exercise His GM had an MI in her 76s, but otherwise no known family history of CAD He does not have HTN or DM  He does have some lower back problems- he is doing PT which does help him, and also massage therapy  He has a history of vertigo/ vestibular sx.  He did have an MRI of his brain per ENT about 3 years ago He is seeing Dr. Tomi Likens for this- he is on neurontin No double vision. His sx occur when he moves his head- like when he stands up from a bent over position or when he moves his head quickly.   He is interested in doing vestibular rehab per [place] a referral for same  Patient Active Problem List   Diagnosis Date Noted  . Prostate cancer screening 11/05/2015  . Multiple thyroid nodules 11/05/2015  . Peripheral neuropathy ([place]) 04/18/2015  . Vertigo 04/15/2015  . FH: brain aneurysm 04/15/2015  . Visit for preventive health examination 10/17/2014  . 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 Screening for ischemic heart disease 10/17/2014  . Seasonal affective disorder ([place]) 10/02/2014  . Internal hemorrhoid 04/20/2014  . Chronic low back pain  04/20/2014  . Anxiety 04/20/2014  . Esophageal reflux 04/04/2011  . Diverticulosis of colon (without mention of hemorrhage) 04/04/2011    Past Medical History:  Diagnosis Date  . Abdominal pain, right lower quadrant   . Acute bronchospasm   . Backache, unspecified   . Chicken pox   . Diverticulosis of colon (without mention of hemorrhage)   . Esophageal reflux   . External hemorrhoids without mention of complication    thrombosed  . Hiatal hernia   . Inguinal hernia without mention of obstruction or gangrene, unilateral or unspecified, (not specified as recurrent)   . Migraine   . Obstructive sleep apnea (adult) (pediatric)   . Other symptoms involving digestive system(787.99)   . Pain in joint, forearm   . Pain in joint, shoulder region   . Seasonal allergies   . Thoracic or lumbosacral neuritis or radiculitis, unspecified     Past Surgical History:  Procedure Laterality Date  . COLON RESECTION  10/06   sigmoid colon  . INGUINAL HERNIA REPAIR  12/2012   Left  . WISDOM TOOTH EXTRACTION      Social History  Substance Use Topics  . Smoking status: Former Smoker    Types: Cigarettes, Cigars    Quit date: 10/04/1987  . Smokeless tobacco: Never Used  . Alcohol use 4.2 - 8.4 oz/week  7 - 14 Standard drinks or equivalent per week     Comment: 2 daily     Family History  Problem Relation Age of Onset  . Diabetes Maternal Aunt   . Lung cancer Mother 7    Deceased  . Colon cancer Neg Hx   . Other Father     MVA  . Heart disease Paternal Grandmother   . Heart attack Paternal Grandmother   . Healthy Brother     x2  . Aneurysm Sister     Brain  . Allergies Daughter     x2  . Migraines Daughter     x1    Allergies  Allergen Reactions  . Escitalopram Hives    Pt felt anxious  Pt felt his skin was "on fire"  . Protonix [Pantoprazole Sodium] Other (See Comments)    Tingling of extremities    Medication list has been reviewed and updated.  Current  Outpatient Prescriptions on File Prior to Visit  Medication Sig Dispense Refill  . aspirin 81 MG tablet Take 81 mg by mouth daily.    . celecoxib (CELEBREX) 200 MG capsule Take 1 capsule (200 mg total) by mouth daily. (Patient taking differently: Take 200 mg by mouth daily as needed. ) 30 capsule 1  . gabapentin (NEURONTIN) 100 MG capsule Take 2 capsule three times daily. 540 capsule 3  . LORazepam (ATIVAN) 1 MG tablet Take 1 tablet (1 mg total) by mouth 2 (two) times daily. (Patient taking differently: Take 1 mg by mouth 2 (two) times daily as needed. ) 60 tablet 1  . methylPREDNISolone (MEDROL DOSEPAK) 4 MG TBPK tablet Take following package directions 21 tablet 0  . ranitidine (ZANTAC) 150 MG tablet TAKE 1 TABLET BY MOUTH TWICE DAILY 60 tablet 3  . tadalafil (CIALIS) 20 MG tablet Take 1 tablet (20 mg total) by mouth daily as needed for erectile dysfunction. 10 tablet 0   No current facility-administered medications on file prior to visit.     Review of Systems:  As per HPI- otherwise negative.   Physical Examination: Blood pressure 126/75, pulse 88, temperature 97.8 F (36.6 C), temperature source Oral, height 5\' 6"  (1.676 m), weight 158 lb (71.7 kg), SpO2 100 %. Body mass index is 25.5 kg/m.  Ideal Body Weight:    GEN: WDWN, NAD, Non-toxic, A & O x 3, looks well, minimal overweight HEENT: Atraumatic, Normocephalic. Neck supple. No masses, No LAD. Ears and Nose: No external deformity. CV: RRR, No M/G/R. No JVD. No thrill. No extra heart sounds. PULM: CTA B, no wheezes, crackles, rhonchi. No retractions. No resp. distress. No accessory muscle use. ABD: S, NT, ND. No rebound. No HSM. EXTR: No c/c/e NEURO Normal gait.  PSYCH: Normally interactive. Conversant. Not depressed or anxious appearing.  Calm demeanor.    Assessment and Plan: Screening for diabetes mellitus - Plan: Comprehensive metabolic panel, Hemoglobin A1C  Screening examination for infectious disease - Plan:  Hepatitis C antibody  Screening for hyperlipidemia - Plan: Lipid panel  Screening for deficiency anemia - Plan: CBC  Screening for prostate cancer - Plan: PSA  Vertigo - Plan: Ambulatory referral to Physical Therapy  Here today to establish care and do labs today He is generally healthy except for a several year history of vertigo which he connects with getting a flu shot years ago. He is seeing neurology and would like a referral to vestibular rehab.  In addition, he notes that when he took some pseudoephedrine a week or so  ago for nasal congestion it really helped with his vertigo.  He is not sure if it would be ok to continue using this.  Advised that I think a 1-2 weeks trial of claritin D or allegra D is reasonable Will plan further follow- up pending labs.   Signed Lamar Blinks, MD  Results for orders placed or performed in visit on 11/13/16  CBC  Result Value Ref Range   WBC 12.3 (H) 4.0 - 10.5 K/uL   RBC 4.64 4.22 - 5.81 Mil/uL   Platelets 321.0 150.0 - 400.0 K/uL   Hemoglobin 14.6 13.0 - 17.0 g/dL   HCT 42.8 39.0 - 52.0 %   MCV 92.3 78.0 - 100.0 fl   MCHC 34.1 30.0 - 36.0 g/dL   RDW 14.2 11.5 - 15.5 %  Comprehensive metabolic panel  Result Value Ref Range   Sodium 141 135 - 145 mEq/L   Potassium 4.7 3.5 - 5.1 mEq/L   Chloride 103 96 - 112 mEq/L   CO2 30 19 - 32 mEq/L   Glucose, Bld 74 70 - 99 mg/dL   BUN 16 6 - 23 mg/dL   Creatinine, Ser 1.03 0.40 - 1.50 mg/dL   Total Bilirubin 0.4 0.2 - 1.2 mg/dL   Alkaline Phosphatase 49 39 - 117 U/L   AST 21 0 - 37 U/L   ALT 19 0 - 53 U/L   Total Protein 6.9 6.0 - 8.3 g/dL   Albumin 4.4 3.5 - 5.2 g/dL   Calcium 9.7 8.4 - 10.5 mg/dL   GFR 79.26 >60.00 mL/min  Lipid panel  Result Value Ref Range   Cholesterol 218 (H) 0 - 200 mg/dL   Triglycerides 165.0 (H) 0.0 - 149.0 mg/dL   HDL 44.10 >39.00 mg/dL   VLDL 33.0 0.0 - 40.0 mg/dL   LDL Cholesterol 141 (H) 0 - 99 mg/dL   Total CHOL/HDL Ratio 5    NonHDL 174.04   PSA   Result Value Ref Range   PSA 0.79 0.10 - 4.00 ng/mL  Hemoglobin A1C  Result Value Ref Range   Hgb A1c MFr Bld 5.7 4.6 - 6.5 %

## 2016-11-13 NOTE — Progress Notes (Signed)
Pre visit review using our clinic review tool, if applicable. No additional management support is needed unless otherwise documented below in the visit note. 

## 2016-11-14 LAB — HEPATITIS C ANTIBODY: HCV Ab: NEGATIVE

## 2016-12-05 MED FILL — raNITIdine HCL 150 MG TABS: 150 | 30 days supply | Qty: 60 | Fill #1

## 2016-12-05 MED FILL — GABAPENTIN 100 MG CAPSULE: 100 | 30 days supply | Qty: 180 | Fill #5

## 2016-12-19 DIAGNOSIS — R42 Dizziness and giddiness: Secondary | ICD-10-CM | POA: Diagnosis not present

## 2016-12-19 DIAGNOSIS — H8110 Benign paroxysmal vertigo, unspecified ear: Secondary | ICD-10-CM | POA: Diagnosis not present

## 2017-01-07 ENCOUNTER — Other Ambulatory Visit: Payer: Self-pay | Admitting: Emergency Medicine

## 2017-01-07 DIAGNOSIS — M4306 Spondylolysis, lumbar region: Secondary | ICD-10-CM

## 2017-01-07 MED ORDER — CELECOXIB 200 MG PO CAPS: 200.0000 mg | ORAL_CAPSULE | Freq: Every day | ORAL | 1 refills | Status: DC

## 2017-01-07 MED FILL — CELECOXIB 200 MG CAPSULE: 200 | 30 days supply | Qty: 30 | Fill #0

## 2017-01-07 NOTE — Telephone Encounter (Signed)
Received refill request for CELECOXIB 200 MG CAPSULE from pharmacy. Last office visit 11/13/16 and last refill 03/31/16. Refill sent to pharmacy.

## 2017-01-08 ENCOUNTER — Ambulatory Visit (INDEPENDENT_AMBULATORY_CARE_PROVIDER_SITE_OTHER): Payer: 59 | Admitting: Family Medicine

## 2017-01-08 VITALS — BP 111/71 | HR 90 | Temp 98.3°F | Ht 65.0 in | Wt 158.2 lb

## 2017-01-08 DIAGNOSIS — E785 Hyperlipidemia, unspecified: Secondary | ICD-10-CM | POA: Diagnosis not present

## 2017-01-08 DIAGNOSIS — D72829 Elevated white blood cell count, unspecified: Secondary | ICD-10-CM | POA: Diagnosis not present

## 2017-01-08 LAB — CBC
HCT: 40.5 % (ref 39.0–52.0)
Hemoglobin: 14.1 g/dL (ref 13.0–17.0)
MCHC: 34.8 g/dL (ref 30.0–36.0)
MCV: 91.5 fl (ref 78.0–100.0)
Platelets: 329 10*3/uL (ref 150.0–400.0)
RBC: 4.42 Mil/uL (ref 4.22–5.81)
RDW: 14.3 % (ref 11.5–15.5)
WBC: 7.3 10*3/uL (ref 4.0–10.5)

## 2017-01-08 MED ORDER — PRAVASTATIN SODIUM 20 MG PO TABS: 20.0000 mg | ORAL_TABLET | Freq: Every day | ORAL | 3 refills | Status: DC

## 2017-01-08 NOTE — Patient Instructions (Signed)
We will repeat your CBC today to make sure that your white blood cell count is back to normal-  Start on the cholesterol med when it arrives- one a day. Please come in for a lab visit only in 3-4 months to check your cholesterol

## 2017-01-08 NOTE — Progress Notes (Signed)
[place] [facility] [address], [place], [place] [address] [phone] [id number]  Date:  01/08/2017   Name:  Curtis Luna   DOB:  05/19/1961   MRN:  [id number]  PCP:  Leeanne Rio, PA-C    Chief Complaint: Follow-up (Pt here to discuss starting cholesterol meds. Also due for lab recheck. Pt states that he is not fasting at this time. )   History of Present Illness:  Curtis Luna is a 56 y.o. very pleasant male patient who presents with the following:  Last seen on 11-13-2016, HPI from that visit:  Here today as a new patient to establish care with myself now that Curtis Luna has moved to a new location. History of anxiety, meralgia paresthetica Due for a flu shot, hep C screening.  Declines a flu shot today Tdap is UTD Colonoscopy is UTD Last labs are from a year ago- he is fasting today for labs.  He states that his cholesterol is always "right on the edge."  He tries to keep this in check with diet and exercise His GM had an MI in her 66s, but otherwise no known family history of CAD He does not have HTN or DM  He does have some lower back problems- he is doing PT which does help him, and also massage therapy  He has a history of vertigo/ vestibular sx.  He did have an MRI of his brain per ENT about 3 years ago He is seeing Dr. Tomi Likens for this- he is on neurontin No double vision. His sx occur when he moves his head- like when he stands up from a bent over position or when he moves his head quickly.   He is interested in doing vestibular rehab per [facility] a referral for same  Plan from visit on 11-13-2016:  Here today to establish care and do labs today He is generally healthy except for a several year history of vertigo which he connects with getting a flu shot years ago. He is seeing neurology and would like a referral to vestibular rehab.  In addition, he notes that when he took some pseudoephedrine a week or so ago  for nasal congestion it really helped with his vertigo.  He is not sure if it would be ok to continue using this.  Advised that I think a 1-2 weeks trial of claritin D or allegra D is reasonable Will plan further follow- up pending labs. Sent MyChart message offering cholesterol medication.  No response received.  HPI for today's visit: I had suggested via mychart that we consider starting him on a statin.  He would like to do so to help bring his cholesterol under better control Lipids:    Component Value Date/Time   CHOL 218 (H) 11/13/2016 0857   TRIG 165.0 (H) 11/13/2016 0857   HDL 44.10 11/13/2016 0857   LDLDIRECT 146 (H) 10/17/2014 0930   VLDL 33.0 11/13/2016 0857   CHOLHDL 5 11/13/2016 0857     In the past diet and exercise has had minimal impact.  Does not eat many sweets.  Drinks coffee every morning.  Tries to limit red meat intake.    Older brother is taking Lipitor.  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    Is not fasting, had fruit and a ham & cheese sandwich for lunch.  Had oatmeal for breakfast.  He does not have any history of intolerance to statins  Also noted  mild leukocytosis at his last lab draw- he would like to recheck this today  Lab Results  Component Value Date   WBC 12.3 (H) 11/13/2016   HGB 14.6 11/13/2016   HCT 42.8 11/13/2016   MCV 92.3 11/13/2016   PLT 321.0 11/13/2016     Patient Active Problem List   Diagnosis Date Noted  . Prostate cancer screening 11/05/2015  . Multiple thyroid nodules 11/05/2015  . Peripheral neuropathy ([place]) 04/18/2015  . Vertigo 04/15/2015  . FH: brain aneurysm 04/15/2015  . Visit for preventive health examination 10/17/2014  . 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 Screening for ischemic heart disease 10/17/2014  . Seasonal affective disorder ([place]) 10/02/2014  . Internal hemorrhoid 04/20/2014  . Chronic low back pain 04/20/2014  . Anxiety 04/20/2014  . Esophageal reflux 04/04/2011  . Diverticulosis of colon  (without mention of hemorrhage) 04/04/2011    Past Medical History:  Diagnosis Date  . Abdominal pain, right lower quadrant   . Acute bronchospasm   . Backache, unspecified   . Chicken pox   . Diverticulosis of colon (without mention of hemorrhage)   . Esophageal reflux   . External hemorrhoids without mention of complication    thrombosed  . Hiatal hernia   . Inguinal hernia without mention of obstruction or gangrene, unilateral or unspecified, (not specified as recurrent)   . Migraine   . Obstructive sleep apnea (adult) (pediatric)   . Other symptoms involving digestive system(787.99)   . Pain in joint, forearm   . Pain in joint, shoulder region   . Seasonal allergies   . Thoracic or lumbosacral neuritis or radiculitis, unspecified     Past Surgical History:  Procedure Laterality Date  . COLON RESECTION  10/06   sigmoid colon  . INGUINAL HERNIA REPAIR  12/2012   Left  . WISDOM TOOTH EXTRACTION      Social History  Substance Use Topics  . Smoking status: Former Smoker    Types: Cigarettes, Cigars    Quit date: 10/04/1987  . Smokeless tobacco: Never Used  . Alcohol use 4.2 - 8.4 oz/week    7 - 14 Standard drinks or equivalent per week     Comment: 2 daily     Family History  Problem Relation Age of Onset  . Diabetes Maternal Aunt   . Lung cancer Mother 25    Deceased  . Colon cancer Neg Hx   . Other Father     MVA  . Heart disease Paternal Grandmother   . Heart attack Paternal Grandmother   . Healthy Brother     x2  . Aneurysm Sister     Brain  . Allergies Daughter     x2  . Migraines Daughter     x1    Allergies  Allergen Reactions  . Escitalopram Hives    Pt felt anxious  Pt felt his skin was "on fire"  . Protonix [Pantoprazole Sodium] Other (See Comments)    Tingling of extremities    Medication list has been reviewed and updated.  Current Outpatient Prescriptions on File Prior to Visit  Medication Sig Dispense Refill  . aspirin 81 MG  tablet Take 81 mg by mouth daily.    . celecoxib (CELEBREX) 200 MG capsule Take 1 capsule (200 mg total) by mouth daily. 30 capsule 1  . gabapentin (NEURONTIN) 100 MG capsule Take 2 capsule three times daily. 540 capsule 3  . LORazepam (ATIVAN) 1 MG tablet Take 1 tablet (1 mg total)  by mouth 2 (two) times daily. (Patient taking differently: Take 1 mg by mouth 2 (two) times daily as needed. ) 60 tablet 1  . ranitidine (ZANTAC) 150 MG tablet TAKE 1 TABLET BY MOUTH TWICE DAILY 60 tablet 3  . tadalafil (CIALIS) 20 MG tablet Take 1 tablet (20 mg total) by mouth daily as needed for erectile dysfunction. 10 tablet 0   No current facility-administered medications on file prior to visit.     Review of Systems:  As per HPI- otherwise negative.   Physical Examination: Vitals:   01/08/17 1301  BP: 111/71  Pulse: 90  Temp: 98.3 F (36.8 C)   Vitals:   01/08/17 1301  Weight: 158 lb 3.2 oz (71.8 kg)  Height: 5\' 5"  (1.651 m)   Body mass index is 26.33 kg/m. Ideal Body Weight: Weight in (lb) to have BMI = 25: 149.9  GEN: WDWN, NAD, Non-toxic, A & O x 3, looks well, mild overweight HEENT: Atraumatic, Normocephalic. Neck supple. No masses, No LAD. Ears and Nose: No external deformity. CV: RRR, No M/G/R. No JVD. No thrill. No extra heart sounds. PULM: CTA B, no wheezes, crackles, rhonchi. No retractions. No resp. distress. No accessory muscle use. EXTR: No c/c/e NEURO Normal gait.  PSYCH: Normally interactive. Conversant. Not depressed or anxious appearing.  Calm demeanor.    Assessment and Plan: Dyslipidemia - Plan: pravastatin (PRAVACHOL) 20 MG tablet  Leukocytosis, unspecified type - Plan: CBC  Here today to discuss his dyslipidemia. We will start him on pravachol 20 mg once a day and plan to repeat a FLP in 3-4 months.  Placed a future order for this test  CBC today- will be in touch with these results asap   Signed Lamar Blinks, MD

## 2017-01-29 ENCOUNTER — Telehealth: Payer: Self-pay | Admitting: Neurology

## 2017-01-29 DIAGNOSIS — G6289 Other specified polyneuropathies: Secondary | ICD-10-CM

## 2017-01-29 MED ORDER — GABAPENTIN 100 MG PO CAPS: ORAL_CAPSULE | ORAL | 1 refills | Status: DC

## 2017-01-29 NOTE — Telephone Encounter (Signed)
PT left a message asking for a call back from Dr Georgie Chard nursr/Dawn CB# [phone]

## 2017-01-29 NOTE — Telephone Encounter (Signed)
Spoke to patient. Requested to have Gabapentin Rx sent to [facility]. Sent Rx.

## 2017-02-27 DIAGNOSIS — M2241 Chondromalacia patellae, right knee: Secondary | ICD-10-CM | POA: Diagnosis not present

## 2017-02-27 DIAGNOSIS — M7651 Patellar tendinitis, right knee: Secondary | ICD-10-CM | POA: Diagnosis not present

## 2017-02-27 DIAGNOSIS — Z87891 Personal history of nicotine dependence: Secondary | ICD-10-CM | POA: Diagnosis not present

## 2017-02-27 DIAGNOSIS — M25561 Pain in right knee: Secondary | ICD-10-CM | POA: Diagnosis not present

## 2017-02-27 DIAGNOSIS — Z09 Encounter for follow-up examination after completed treatment for conditions other than malignant neoplasm: Secondary | ICD-10-CM | POA: Diagnosis not present

## 2017-03-05 ENCOUNTER — Ambulatory Visit (INDEPENDENT_AMBULATORY_CARE_PROVIDER_SITE_OTHER): Payer: Self-pay | Admitting: Orthopedic Surgery

## 2017-04-22 ENCOUNTER — Telehealth: Payer: Self-pay | Admitting: Physician Assistant

## 2017-04-22 ENCOUNTER — Other Ambulatory Visit: Payer: Self-pay | Admitting: Emergency Medicine

## 2017-04-22 DIAGNOSIS — M4306 Spondylolysis, lumbar region: Secondary | ICD-10-CM

## 2017-04-22 MED ORDER — CELECOXIB 200 MG PO CAPS: 200.0000 mg | ORAL_CAPSULE | Freq: Every day | ORAL | 3 refills | Status: DC

## 2017-04-22 NOTE — Telephone Encounter (Signed)
Caller name: Relationship to patient: Self Can be reached: [phone]  Pharmacy:  [email or web address], [place] [phone] (Phone) [phone] (Fax)     Reason for call: Request 90 day supply of celecoxib (CELEBREX) 200 MG capsule [[id number]

## 2017-04-24 DIAGNOSIS — H5203 Hypermetropia, bilateral: Secondary | ICD-10-CM | POA: Diagnosis not present

## 2017-04-24 DIAGNOSIS — H524 Presbyopia: Secondary | ICD-10-CM | POA: Diagnosis not present

## 2017-04-28 ENCOUNTER — Encounter: Payer: Self-pay | Admitting: Family Medicine

## 2017-05-07 ENCOUNTER — Encounter: Payer: Self-pay | Admitting: Medical

## 2017-05-07 ENCOUNTER — Ambulatory Visit (INDEPENDENT_AMBULATORY_CARE_PROVIDER_SITE_OTHER): Payer: 59 | Admitting: Medical

## 2017-05-07 VITALS — BP 116/75 | HR 80 | Temp 98.5°F | Ht 65.0 in | Wt 151.6 lb

## 2017-05-07 DIAGNOSIS — R361 Hematospermia: Secondary | ICD-10-CM

## 2017-05-07 DIAGNOSIS — R102 Pelvic and perineal pain: Secondary | ICD-10-CM

## 2017-05-07 DIAGNOSIS — R809 Proteinuria, unspecified: Secondary | ICD-10-CM

## 2017-05-07 LAB — POC URINALSYSI DIPSTICK (AUTOMATED)
Bilirubin, UA: NEGATIVE
Blood, UA: NEGATIVE
Glucose, UA: NEGATIVE
Ketones, UA: NEGATIVE
Leukocytes, UA: NEGATIVE
Nitrite, UA: NEGATIVE
Protein, UA: NEGATIVE
Spec Grav, UA: 1.025 (ref 1.010–1.025)
Urobilinogen, UA: 4 E.U./dL — AB
pH, UA: 6 (ref 5.0–8.0)

## 2017-05-07 MED ORDER — SULFAMETHOXAZOLE-TRIMETHOPRIM 800-160 MG PO TABS: 1.0000 | ORAL_TABLET | Freq: Two times a day (BID) | ORAL | 0 refills | Status: DC

## 2017-05-07 MED ORDER — CIPROFLOXACIN HCL 500 MG PO TABS: 500.0000 mg | ORAL_TABLET | Freq: Two times a day (BID) | ORAL | 0 refills | Status: DC

## 2017-05-07 MED FILL — CIPROFLOXACIN HCL 500 MG TA: 500 | 10 days supply | Qty: 20 | Fill #0

## 2017-05-07 NOTE — Progress Notes (Signed)
Blood in [place]

## 2017-05-07 NOTE — Telephone Encounter (Signed)
New antibiotic sent since pt expressed concern for cipro and potential side effects. Addressed pt my chart message See message I sent him.

## 2017-05-07 NOTE — Patient Instructions (Signed)
Your painful ejaculation and blood in ejaculate cause concern for epididymitis or prostatitis though your exam of testicles today don't match this diagnosis and psa in past have been normal range. However do want to get psa today.   I want to rx antibiotic cipro to above potential diagnosis.  I will go ahead and put in a referral to urologist and ask for sooner appointment than end of July.  Follow up 10 days or as needed.

## 2017-05-07 NOTE — Telephone Encounter (Signed)
Future order cmp placed. 

## 2017-05-07 NOTE — Progress Notes (Signed)
Subjective:    Patient ID: Curtis Luna, male    DOB: [birth year], 56 y.o.   MRN: [id number]  HPI   Pt in for evaluation.  He give me update that hx of inguinal hernia surgery on the left side. Surgery was about 3-4 years ago. About 2 months after surgery he states when he ejaculates will lower suprapubic region pain. He gets lower suprapubic pain occasionally without ejaculation. Pt states that he is being discouraged from having sex since pain always with sex. He states right before he ejaculates and holding back will have sharp high level pain. 2 days ago when this occurred he had some blood in his ejaculate. He tried to call urologist and called [facility]. They gave June 19, 2017.   Pt last psa 5 month ago was ago. No perineum pain. No cva pain.  No fever, no chills or sweats.   Hx of cholectomy 12 years.    Review of Systems  Constitutional: Negative for chills, fatigue and fever.  Eyes: Negative for redness and itching.  Respiratory: Negative for cough, chest tightness, shortness of breath and wheezing.   Cardiovascular: Negative for chest pain and palpitations.  Gastrointestinal: Negative for abdominal distention, abdominal pain, diarrhea and nausea.  Genitourinary: Negative for difficulty urinating, dysuria, frequency, hematuria, penile pain, scrotal swelling and testicular pain.       Painful ejaculation  Bloody ejaculate one time.  Musculoskeletal: Negative for back pain.  Skin: Negative for rash.  Neurological: Negative for dizziness, facial asymmetry, speech difficulty, weakness and headaches.  Hematological: Negative for adenopathy. Does not bruise/bleed easily.  Psychiatric/Behavioral: Negative for behavioral problems, confusion and sleep disturbance. The patient is not nervous/anxious.     Past Medical History:  Diagnosis Date  . Abdominal pain, right lower quadrant   . Acute bronchospasm   . Backache, unspecified   . Chicken pox   . Diverticulosis of colon  (without mention of hemorrhage)   . Esophageal reflux   . External hemorrhoids without mention of complication    thrombosed  . Hiatal hernia   . Inguinal hernia without mention of obstruction or gangrene, unilateral or unspecified, (not specified as recurrent)   . Migraine   . Obstructive sleep apnea (adult) (pediatric)   . Other symptoms involving digestive system(787.99)   . Pain in joint, forearm   . Pain in joint, shoulder region   . Seasonal allergies   . Thoracic or lumbosacral neuritis or radiculitis, unspecified      Social History   Social History  . Marital status: Married    Spouse name: N/A  . Number of children: N/A  . Years of education: N/A   Occupational History  . Technical sales engineer     Social History Main Topics  . Smoking status: Former Smoker    Types: Cigarettes, Cigars    Quit date: 10/04/1987  . Smokeless tobacco: Never Used  . Alcohol use 4.2 - 8.4 oz/week    7 - 14 Standard drinks or equivalent per week     Comment: 2 daily   . Drug use: No  . Sexual activity: Not on file   Other Topics Concern  . Not on file   Social History Narrative   1 caffeine drink daily  Lives with wife in a one story home.  Has 2 children.  Works in Estate manager/land agent.    Past Surgical History:  Procedure Laterality Date  . COLON RESECTION  10/06   sigmoid colon  . INGUINAL HERNIA REPAIR  12/2012   Left  . WISDOM TOOTH EXTRACTION      Family History  Problem Relation Age of Onset  . Diabetes Maternal Aunt   . Lung cancer Mother 16       Deceased  . Colon cancer Neg Hx   . Other Father        MVA  . Heart disease Paternal Grandmother   . Heart attack Paternal Grandmother   . Healthy Brother        x2  . Aneurysm Sister        Brain  . Allergies Daughter        x2  . Migraines Daughter        x1    Allergies  Allergen Reactions  . Escitalopram Hives    Pt felt anxious  Pt felt his skin was "on fire"  . Protonix [Pantoprazole Sodium] Other  (See Comments)    Tingling of extremities    Current Outpatient Prescriptions on File Prior to Visit  Medication Sig Dispense Refill  . aspirin 81 MG tablet Take 81 mg by mouth daily.    . celecoxib (CELEBREX) 200 MG capsule Take 1 capsule (200 mg total) by mouth daily. Use as needed for pain 90 capsule 3  . gabapentin (NEURONTIN) 100 MG capsule Take 2 capsule three times daily. 540 capsule 1  . LORazepam (ATIVAN) 1 MG tablet Take 1 tablet (1 mg total) by mouth 2 (two) times daily. (Patient taking differently: Take 1 mg by mouth 2 (two) times daily as needed. ) 60 tablet 1  . pravastatin (PRAVACHOL) 20 MG tablet Take 1 tablet (20 mg total) by mouth daily. 90 tablet 3  . ranitidine (ZANTAC) 150 MG tablet TAKE 1 TABLET BY MOUTH TWICE DAILY 60 tablet 3  . tadalafil (CIALIS) 20 MG tablet Take 1 tablet (20 mg total) by mouth daily as needed for erectile dysfunction. 10 tablet 0   No current facility-administered medications on file prior to visit.     BP 116/75   Pulse 80   Temp 98.5 F (36.9 C) (Oral)   Ht 5\' 5"  (1.651 m)   Wt 151 lb 9.6 oz (68.8 kg)   SpO2 100%   BMI 25.23 kg/m       Objective:   Physical Exam  General- No acute distress. Pleasant patient. Neck- Full range of motion, no jvd Lungs- Clear, even and unlabored. Heart- regular rate and rhythm. Neurologic- CNII- XII grossly intact. Abdomen- soft,nt, nd, +bs, no rebound or guarding. No suprapubic tenderness to palpatoin.  Genital exam- normal glands penis. Testicle nontender. Epididymis non-tender.no hernia on exam of inguinal canals.      Assessment & Plan:  Your painful ejaculation and blood in ejaculate cause concern for epididymitis or prostatitis though your exam of testicles today don't match this diagnosis and psa in past have been normal range. However do want to get psa today.   I want to rx antibiotic cipro to above potential diagnosis.  I will go ahead and put in a referral to urologist and ask for  sooner appointment than end of July.  Follow up 10 days or as needed.  Hikari Tripp, Percell Miller, PA-C

## 2017-05-08 ENCOUNTER — Other Ambulatory Visit (INDEPENDENT_AMBULATORY_CARE_PROVIDER_SITE_OTHER): Payer: 59

## 2017-05-08 ENCOUNTER — Ambulatory Visit: Payer: Self-pay | Admitting: Medical

## 2017-05-08 DIAGNOSIS — R809 Proteinuria, unspecified: Secondary | ICD-10-CM

## 2017-05-08 LAB — PSA: PSA: 1.02 ng/mL (ref 0.10–4.00)

## 2017-05-08 MED ORDER — SULFAMETHOXAZOLE-TRIMETHOPRIM 800-160 MG PO TABS: 1.0000 | ORAL_TABLET | Freq: Two times a day (BID) | ORAL | 0 refills | Status: DC

## 2017-05-08 MED FILL — SULFAMETHOXAZOLE/TMP DS TAB: 800-160 | 10 days supply | Qty: 20 | Fill #0

## 2017-05-08 NOTE — Addendum Note (Signed)
Addended by: Harl Bowie on: 05/08/2017 05:05 PM   Modules accepted: Orders

## 2017-05-08 NOTE — Telephone Encounter (Signed)
Pt states went to pharmacy to pick up rx for Bactrim but pharmacist (Hagan) stated did not received rx, rx was sent to wrong pharmacy pt asked to please send rx to [facility], Please let pt know when rx sent to his pharmacy. [phone] - pt tel.

## 2017-05-09 LAB — COMPREHENSIVE METABOLIC PANEL
ALT: 16 U/L (ref 9–46)
AST: 19 U/L (ref 10–35)
Albumin: 4.1 g/dL (ref 3.6–5.1)
Alkaline Phosphatase: 49 U/L (ref 40–115)
BUN: 17 mg/dL (ref 7–25)
CO2: 27 mmol/L (ref 20–31)
Calcium: 9.2 mg/dL (ref 8.6–10.3)
Chloride: 103 mmol/L (ref 98–110)
Creat: 1.01 mg/dL (ref 0.70–1.33)
Glucose, Bld: 89 mg/dL (ref 65–99)
Potassium: 4.1 mmol/L (ref 3.5–5.3)
Sodium: 138 mmol/L (ref 135–146)
Total Bilirubin: 0.3 mg/dL (ref 0.2–1.2)
Total Protein: 6.6 g/dL (ref 6.1–8.1)

## 2017-05-09 LAB — URINE CULTURE: Organism ID, Bacteria: NO GROWTH

## 2017-05-15 ENCOUNTER — Telehealth: Payer: Self-pay | Admitting: Family Medicine

## 2017-05-15 NOTE — Telephone Encounter (Signed)
[facility] - Client TELEPHONE ADVICE RECORD   [facility]     Patient Name: Curtis Luna Initial Comment Caller has temp of 103  DOB: [birth year]      Nurse Assessment  Nurse: Adrian Blackwater, RN, Claiborne Billings Date/Time (Eastern Time): 05/15/2017 3:55:27 PM  Confirm and document reason for call. If symptomatic, describe symptoms. ---Caller states he developed achiness, sore throat on Wednesday with low grade fever but now his fever is 103. Still having body aches and sore throat with fatigue.   Does the patient have any new or worsening symptoms? ---Yes   Will a triage be completed? ---Yes   Related visit to physician within the last 2 weeks? ---Yes   Does the PT have any chronic conditions? (i.e. diabetes, asthma, etc.) ---Yes   List chronic conditions. ---chronic back pain   Is this a behavioral health or substance abuse call? ---No     Guidelines     Guideline Title Affirmed Question Affirmed Notes   Influenza - Seasonal [1] Probable influenza (fever) with no complications AND [8] NOT HIGH RISK    Final Disposition [place], RN, Claiborne Billings       Disagree/Comply: Comply

## 2017-05-17 ENCOUNTER — Other Ambulatory Visit: Payer: Self-pay | Admitting: Emergency Medicine

## 2017-05-17 ENCOUNTER — Encounter: Payer: Self-pay | Admitting: Emergency Medicine

## 2017-05-17 ENCOUNTER — Emergency Department (INDEPENDENT_AMBULATORY_CARE_PROVIDER_SITE_OTHER)
Admission: EM | Admit: 2017-05-17 | Discharge: 2017-05-17 | Disposition: A | Discharge status: Home / Self Care | Payer: 59 | Source: Home / Self Care | Attending: Family Medicine | Admitting: Family Medicine

## 2017-05-17 DIAGNOSIS — D72819 Decreased white blood cell count, unspecified: Secondary | ICD-10-CM

## 2017-05-17 DIAGNOSIS — R52 Pain, unspecified: Secondary | ICD-10-CM

## 2017-05-17 DIAGNOSIS — R509 Fever, unspecified: Secondary | ICD-10-CM | POA: Diagnosis not present

## 2017-05-17 LAB — POCT CBC W AUTO DIFF (K'VILLE URGENT CARE)

## 2017-05-17 LAB — COMPLETE METABOLIC PANEL WITH GFR
ALT: 25 U/L (ref 9–46)
AST: 42 U/L — ABNORMAL HIGH (ref 10–35)
Albumin: 4.1 g/dL (ref 3.6–5.1)
Alkaline Phosphatase: 52 U/L (ref 40–115)
BUN: 15 mg/dL (ref 7–25)
CO2: 24 mmol/L (ref 20–31)
Calcium: 8.5 mg/dL — ABNORMAL LOW (ref 8.6–10.3)
Chloride: 100 mmol/L (ref 98–110)
Creat: 1.28 mg/dL (ref 0.70–1.33)
GFR, Est African American: 72 mL/min (ref >=60)
GFR, Est Non African American: 62 mL/min (ref >=60)
Glucose, Bld: 87 mg/dL (ref 65–99)
Potassium: 4.7 mmol/L (ref 3.5–5.3)
Sodium: 132 mmol/L — ABNORMAL LOW (ref 135–146)
Total Bilirubin: 0.3 mg/dL (ref 0.2–1.2)
Total Protein: 6.7 g/dL (ref 6.1–8.1)

## 2017-05-17 NOTE — ED Provider Notes (Signed)
CSN: [id number]     Arrival date & time 05/17/17  1149 History   First MD Initiated Contact with Patient 05/17/17 1251     Chief Complaint  Patient presents with  . Fever   (Consider location/radiation/quality/duration/timing/severity/associated sxs/prior Treatment) HPI  Curtis Luna is a 56 y.o. male presenting to UC with c/o fever TMax 103*F with associated body aches, fatigue, and generalized headache when fever is present for about 4 days. Difficulty sleeping at night as fever is usually worse at night.  Denies other symptoms of cough, congestion, sore throat, ear pain, n/v/d. Denies recent travel or sick contacts. No known tick bites.  Tylenol does help with the fever temporarily.    Past Medical History:  Diagnosis Date  . Abdominal pain, right lower quadrant   . Acute bronchospasm   . Backache, unspecified   . Chicken pox   . Diverticulosis of colon (without mention of hemorrhage)   . Esophageal reflux   . External hemorrhoids without mention of complication    thrombosed  . Hiatal hernia   . Inguinal hernia without mention of obstruction or gangrene, unilateral or unspecified, (not specified as recurrent)   . Migraine   . Obstructive sleep apnea (adult) (pediatric)   . Other symptoms involving digestive system(787.99)   . Pain in joint, forearm   . Pain in joint, shoulder region   . Seasonal allergies   . Thoracic or lumbosacral neuritis or radiculitis, unspecified    Past Surgical History:  Procedure Laterality Date  . COLON RESECTION  10/06   sigmoid colon  . INGUINAL HERNIA REPAIR  12/2012   Left  . WISDOM TOOTH EXTRACTION     Family History  Problem Relation Age of Onset  . Other Father        MVA  . Lung cancer Mother 20       Deceased  . Diabetes Maternal Aunt   . Heart disease Paternal Grandmother   . Heart attack Paternal Grandmother   . Healthy Brother        x2  . Aneurysm Sister        Brain  . Allergies Daughter        x2  . Migraines  Daughter        x1  . Colon cancer Neg Hx    Social History  Substance Use Topics  . Smoking status: Former Smoker    Types: Cigarettes, Cigars    Quit date: 10/04/1987  . Smokeless tobacco: Never Used  . Alcohol use 4.2 - 8.4 oz/week    7 - 14 Standard drinks or equivalent per week     Comment: 2 daily     Review of Systems  Constitutional: Positive for appetite change, chills, diaphoresis, fatigue and fever.  HENT: Negative for congestion, ear pain, sore throat, trouble swallowing and voice change.   Respiratory: Negative for cough and shortness of breath.   Cardiovascular: Negative for chest pain and palpitations.  Gastrointestinal: Negative for abdominal pain, diarrhea, nausea and vomiting.  Musculoskeletal: Negative for arthralgias, back pain and myalgias.  Skin: Positive for rash ( faint redness on trunk and back). Negative for color change and wound.  Neurological: Positive for headaches. Negative for dizziness and light-headedness.    Allergies  Escitalopram and Protonix [pantoprazole sodium]  Home Medications   Prior to Admission medications   Medication Sig Start Date End Date Taking? Authorizing Provider  celecoxib (CELEBREX) 200 MG capsule Take 1 capsule (200 mg total) by mouth daily. Use  as needed for pain 04/22/17  Yes Copland, Gay Filler, MD  gabapentin (NEURONTIN) 100 MG capsule Take 2 capsule three times daily. 01/29/17  Yes Jaffe, Adam R, DO  sulfamethoxazole-trimethoprim (BACTRIM DS) 800-160 MG tablet Take 1 tablet by mouth 2 (two) times daily. 05/08/17  Yes Saguier, Percell Miller, PA-C   Meds Ordered and Administered this Visit  Medications - No data to display  BP 108/73 (BP Location: Left Arm)   Pulse 76   Temp 98.7 F (37.1 C) (Oral) Comment (Src): Took Antipyretics 2 hours ago  Resp 16   Ht 5\' 5"  (1.651 m)   Wt 155 lb (70.3 kg)   SpO2 100%   BMI 25.79 kg/m  No data found.   Physical Exam  Constitutional: He is oriented to person, place, and time. He  appears well-developed and well-nourished. No distress.  HENT:  Head: Normocephalic and atraumatic.  Right Ear: Tympanic membrane normal.  Left Ear: Tympanic membrane normal.  Nose: Nose normal.  Mouth/Throat: Uvula is midline, oropharynx is clear and moist and mucous membranes are normal.  Eyes: EOM are normal.  Neck: Normal range of motion. Neck supple.  Cardiovascular: Normal rate and regular rhythm.   Pulmonary/Chest: Effort normal and breath sounds normal. No stridor. No respiratory distress. He has no wheezes. He has no rales.  Abdominal: Soft. He exhibits no distension.  Musculoskeletal: Normal range of motion.  Lymphadenopathy:    He has no cervical adenopathy.  Neurological: He is alert and oriented to person, place, and time.  Skin: Skin is warm and dry. He is not diaphoretic. There is erythema.  Faint erythema on trunk and back. Non-tender. Blanches.   Psychiatric: He has a normal mood and affect. His behavior is normal.  Nursing note and vitals reviewed.   Urgent Care Course     Procedures (including critical care time)  Labs Review Labs Reviewed  COMPLETE METABOLIC PANEL WITH GFR  B. BURGDORFI ANTIBODIES  ROCKY MTN SPOTTED FVR ABS PNL(IGG+IGM)  POCT CBC W AUTO DIFF ([facility])    Imaging Review No results found.    MDM   1. Fever in adult   2. Body aches   3. Leukopenia, unspecified type    CBC: WBC- 2.1  From medical records, last WBC was 7.3 on 01/08/2017  Consulted with Dr. Assunta Found.  Symptoms likely viral in nature. Due to unknown cause of fever, will send off tick borne illness labs. Recommend pt f/u tomorrow with PCP or Dr. Assunta Found for recheck of labs. Pt plans to go to his PCP at [facility] tomorrow 05/18/17.   Discussed symptoms that warrant emergent care in the ED.     Noland Fordyce, PA-C 05/17/17 1454

## 2017-05-17 NOTE — ED Triage Notes (Signed)
Patient presents to [facility] with a complaint of fever, body aches and fatigue x 4 days

## 2017-05-18 ENCOUNTER — Ambulatory Visit (HOSPITAL_BASED_OUTPATIENT_CLINIC_OR_DEPARTMENT_OTHER)
Admission: RE | Admit: 2017-05-18 | Discharge: 2017-05-18 | Disposition: A | Discharge status: Home / Self Care | Payer: 59 | Source: Ambulatory Visit | Attending: Medical | Admitting: Medical

## 2017-05-18 ENCOUNTER — Encounter: Payer: Self-pay | Admitting: Medical

## 2017-05-18 ENCOUNTER — Ambulatory Visit (INDEPENDENT_AMBULATORY_CARE_PROVIDER_SITE_OTHER): Payer: 59 | Admitting: Medical

## 2017-05-18 ENCOUNTER — Telehealth: Payer: Self-pay | Admitting: *Deleted

## 2017-05-18 VITALS — BP 100/72 | HR 94 | Temp 98.8°F | Resp 16 | Ht 65.0 in | Wt 150.2 lb

## 2017-05-18 DIAGNOSIS — D72819 Decreased white blood cell count, unspecified: Secondary | ICD-10-CM | POA: Diagnosis not present

## 2017-05-18 DIAGNOSIS — Z113 Encounter for screening for infections with a predominantly sexual mode of transmission: Secondary | ICD-10-CM | POA: Diagnosis not present

## 2017-05-18 DIAGNOSIS — M791 Myalgia, unspecified site: Secondary | ICD-10-CM

## 2017-05-18 DIAGNOSIS — R509 Fever, unspecified: Secondary | ICD-10-CM | POA: Diagnosis not present

## 2017-05-18 DIAGNOSIS — J029 Acute pharyngitis, unspecified: Secondary | ICD-10-CM | POA: Diagnosis not present

## 2017-05-18 LAB — CBC WITH DIFFERENTIAL/PLATELET
Basophils Absolute: 0 10*3/uL (ref 0.0–0.1)
Basophils Relative: 0.5 % (ref 0.0–3.0)
Eosinophils Absolute: 0 10*3/uL (ref 0.0–0.7)
Eosinophils Relative: 2.1 % (ref 0.0–5.0)
HCT: 42.8 % (ref 39.0–52.0)
Hemoglobin: 14.4 g/dL (ref 13.0–17.0)
Lymphocytes Relative: 44.6 % (ref 12.0–46.0)
Lymphs Abs: 0.9 10*3/uL (ref 0.7–4.0)
MCHC: 33.7 g/dL (ref 30.0–36.0)
MCV: 93.7 fl (ref 78.0–100.0)
Monocytes Absolute: 0.3 10*3/uL (ref 0.1–1.0)
Monocytes Relative: 14.6 % — ABNORMAL HIGH (ref 3.0–12.0)
Neutro Abs: 0.8 10*3/uL — ABNORMAL LOW (ref 1.4–7.7)
Neutrophils Relative %: 38.2 % — ABNORMAL LOW (ref 43.0–77.0)
Platelets: 175 10*3/uL (ref 150.0–400.0)
RBC: 4.57 Mil/uL (ref 4.22–5.81)
RDW: 14.3 % (ref 11.5–15.5)
WBC: 2.1 10*3/uL — ABNORMAL LOW (ref 4.0–10.5)

## 2017-05-18 LAB — POCT INFLUENZA A/B
Influenza A, POC: NEGATIVE
Influenza B, POC: NEGATIVE

## 2017-05-18 LAB — LYME AB/WESTERN BLOT REFLEX: B burgdorferi Ab IgG+IgM: <0.9 Index (ref <0.90)

## 2017-05-18 LAB — POCT RAPID STREP A (OFFICE): Rapid Strep A Screen: NEGATIVE

## 2017-05-18 LAB — ROCKY MTN SPOTTED FVR ABS PNL(IGG+IGM)
RMSF IgG: NOT DETECTED
RMSF IgM: NOT DETECTED

## 2017-05-18 NOTE — Telephone Encounter (Signed)
Spoke to pt given lab results. He had f/u appt with his PCP today. Advised him to call back if he has any questions or concerns.

## 2017-05-18 NOTE — Progress Notes (Addendum)
Subjective:    Patient ID: Curtis Luna, male    DOB: November 26, 1961, 56 y.o.   MRN: [id number]  HPI   Last Wednesday night he had fever, chills and sweats(mild body aches). He pt went to urgent care and at urgent care. [facility] did test and wbc was 2. Pt had some tick bite studies done. No known tick bites. Though he does some outside work at his house but no bites .Yesterday temperature was 102. Both yesterday morning and this morning fever as well.   Chills, sweats and body aches resolved. But still fever on and off.  Pt did not have chest xray yesterday.   Pt not having any diarrhea. Maybe faint loose stools. But not eating much.  Pt appetite some decreased.  Also, patient has been on bactrim ds for left testile pain/possible epididymitis or prostatis.  PSA was normal  Urine showed no growth. Pt states no recent relatons with wife so can't tell if he his still has bloody ejaculate.  Pt just finished bactrim yesterday.  Temp today 98.8. Last tylenol.   Review of Systems  Constitutional: Positive for chills, diaphoresis, fatigue and fever.       Fatigue at onset.  But not chills and sweats decreasing.  HENT: Positive for sore throat. Negative for congestion, ear pain, mouth sores, nosebleeds, rhinorrhea, sinus pain, sinus pressure, tinnitus and trouble swallowing.   Eyes: Negative for pain and redness.  Respiratory: Negative for cough, chest tightness, shortness of breath and wheezing.   Cardiovascular: Negative for chest pain and palpitations.  Gastrointestinal: Negative for abdominal pain, diarrhea and vomiting.  Genitourinary: Negative for dysuria and flank pain.  Musculoskeletal: Positive for myalgias. Negative for back pain, neck pain and neck stiffness.       Early on but now not much.  Skin: Negative for pallor and rash.  Neurological: Negative for dizziness and headaches.  Hematological: Negative for adenopathy. Does not bruise/bleed easily.    Psychiatric/Behavioral: Negative for behavioral problems, confusion, self-injury and sleep disturbance.    Past Medical History:  Diagnosis Date  . Abdominal pain, right lower quadrant   . Acute bronchospasm   . Backache, unspecified   . Chicken pox   . Diverticulosis of colon (without mention of hemorrhage)   . Esophageal reflux   . External hemorrhoids without mention of complication    thrombosed  . Hiatal hernia   . Inguinal hernia without mention of obstruction or gangrene, unilateral or unspecified, (not specified as recurrent)   . Migraine   . Obstructive sleep apnea (adult) (pediatric)   . Other symptoms involving digestive system(787.99)   . Pain in joint, forearm   . Pain in joint, shoulder region   . Seasonal allergies   . Thoracic or lumbosacral neuritis or radiculitis, unspecified      Social History   Social History  . Marital status: Married    Spouse name: N/A  . Number of children: N/A  . Years of education: N/A   Occupational History  . Technical sales engineer     Social History Main Topics  . Smoking status: Former Smoker    Types: Cigarettes, Cigars    Quit date: 10/04/1987  . Smokeless tobacco: Never Used  . Alcohol use 4.2 - 8.4 oz/week    7 - 14 Standard drinks or equivalent per week     Comment: 2 daily   . Drug use: No  . Sexual activity: Not on file   Other Topics Concern  . Not  on file   Social History Narrative   1 caffeine drink daily  Lives with wife in a one story home.  Has 2 children.  Works in Estate manager/land agent.    Past Surgical History:  Procedure Laterality Date  . COLON RESECTION  10/06   sigmoid colon  . INGUINAL HERNIA REPAIR  12/2012   Left  . WISDOM TOOTH EXTRACTION      Family History  Problem Relation Age of Onset  . Other Father        MVA  . Lung cancer Mother 36       Deceased  . Diabetes Maternal Aunt   . Heart disease Paternal Grandmother   . Heart attack Paternal Grandmother   . Healthy Brother         x2  . Aneurysm Sister        Brain  . Allergies Daughter        x2  . Migraines Daughter        x1  . Colon cancer Neg Hx     Allergies  Allergen Reactions  . Escitalopram Hives    Pt felt anxious  Pt felt his skin was "on fire"  . Protonix [Pantoprazole Sodium] Other (See Comments)    Tingling of extremities    Current Outpatient Prescriptions on File Prior to Visit  Medication Sig Dispense Refill  . celecoxib (CELEBREX) 200 MG capsule Take 1 capsule (200 mg total) by mouth daily. Use as needed for pain 90 capsule 3  . gabapentin (NEURONTIN) 100 MG capsule Take 2 capsule three times daily. 540 capsule 1  . sulfamethoxazole-trimethoprim (BACTRIM DS) 800-160 MG tablet Take 1 tablet by mouth 2 (two) times daily. 20 tablet 0   No current facility-administered medications on file prior to visit.     BP 100/72 (BP Location: Right Arm, Patient Position: Sitting, Cuff Size: Normal)   Pulse 94   Temp 98.8 F (37.1 C) (Oral)   Resp 16   Ht 5\' 5"  (1.651 m)   Wt 150 lb 3.2 oz (68.1 kg)   SpO2 98%   BMI 24.99 kg/m       Objective:   Physical Exam  General  Mental Status - Alert. General Appearance - Well groomed. Not in acute distress.  Skin Rashes- No Rashes.  HEENT Head- Normal. Ear Auditory Canal - Left- Normal. Right - Normal.Tympanic Membrane- Left- Normal. Right- Normal. Eye Sclera/Conjunctiva- Left- Normal. Right- Normal. Nose & Sinuses Nasal Mucosa- Left-  Not  Boggy and Congested. Right- Not Boggy and  Congested.Bilateral  No maxillary and  No frontal sinus pressure. Mouth & Throat Lips: Upper Lip- Normal: no dryness, cracking, pallor, cyanosis, or vesicular eruption. Lower Lip-Normal: no dryness, cracking, pallor, cyanosis or vesicular eruption. Buccal Mucosa- Bilateral- No Aphthous ulcers. Oropharynx- No Discharge or Erythema. Tonsils: Characteristics- Bilateral- mild  Erythema or Congestion. Size/Enlargement- Bilateral- No enlargement. Discharge-  bilateral-None.  Neck Neck- Supple. No Masses. No swollen neck lymph nodes.   Chest and Lung Exam Auscultation: Breath Sounds:-Clear even and unlabored.  Cardiovascular Auscultation:Rythm- Regular, rate and rhythm. Murmurs & Other Heart Sounds:Ausculatation of the heart reveal- No Murmurs.  Lymphatic Head & Neck General Head & Neck Lymphatics: Bilateral: Description- No Localized lymphadenopathy.   Abdomen Inspection:-Inspection Normal.  Palpation/Perucssion: Palpation and Percussion of the abdomen reveal- Non Tender, No Rebound tenderness, No rigidity(Guarding) and No Palpable abdominal masses.  Liver:-Normal.  Spleen:- Normal.   Back- no cva tenderness       Assessment & Plan:  For fever of unknown cause and low wbc we are getting cbc, chest xray and hiv study.(note urine studies for recent  visit and psa was negative)  Rapid throat culture negative and send out culture done. (office will update you on results when those are in)  Flu test negative today.(done after discussion and some report of lingering flu in community but I have not seen)  UC did tick studies and those studies are pending. Please let our office know results when UC notifies you.   During the interim of lab results let us know if any change in signs or symptoms.   Also recommend you try to schedule appointment with pcp late this week or early next week since I won't be available. But do want pcp to be aware of recent fever and that work up is in progress.  Baylie Drakes, Percell Miller, PA-C   addnd 6/5-  Was concerned about RMSF, but saw negative RMSF IgM.  Message to pt to check on how he is doing

## 2017-05-18 NOTE — Patient Instructions (Addendum)
For fever of unknown cause and low wbc we are getting cbc, chest xray and hiv study.(note urine studies for recent  visit and psa was negative)  Rapid throat culture negative and send out culture done.(office will update you on results when those are in)  Flu test negative today.(done after discussion and some report of lingering flu in community but I have not seen)  UC did tick studies and those studies are pending. Please let our office know results when UC notifies you.   During the interim of lab results let us know if any change in signs or symptoms.   Also recommend you try to schedule appointment with pcp late this week or early next week since I won't be available. But do want pcp to be aware of recent fever and that work up is in progress.

## 2017-05-19 ENCOUNTER — Encounter: Payer: Self-pay | Admitting: Family Medicine

## 2017-05-19 LAB — HIV ANTIBODY (ROUTINE TESTING W REFLEX): HIV 1&2 Ab, 4th Generation: NONREACTIVE

## 2017-05-20 ENCOUNTER — Telehealth: Payer: Self-pay | Admitting: Medical

## 2017-05-20 DIAGNOSIS — R102 Pelvic and perineal pain: Secondary | ICD-10-CM

## 2017-05-20 DIAGNOSIS — R361 Hematospermia: Secondary | ICD-10-CM

## 2017-05-20 LAB — CULTURE, GROUP A STREP

## 2017-05-20 NOTE — Telephone Encounter (Signed)
Referral to urologist placed. 

## 2017-05-20 NOTE — Telephone Encounter (Signed)
Opened for review. 

## 2017-05-20 NOTE — Telephone Encounter (Signed)
Dr. Lorelei Pont,  I have seen Mr Cahall twice recently and most recently he had fever and low wbc of undetermined cause. I tried to [place] his last note. The emergency dept thought he had viral illness and as you now tick bite studies were negative. Work up I did was negative.  I been considering differential and wanted to point out on [phone] that he had visit for history of suprapubic  Pressure/pain and described hematospermia. I considered he may have had prostatitis or epididymitis. See note please. I had recommended cipro. Before he filled rx he sent me my chart message expressing he did not want to take due to potential side effects. So I considered non quinolone options and prescribed bactrim. Low wbc and low neutrophil maybe side effect from bactrim? Not sure what to make of fever. I was considering referring to Dr. Marin Olp. Do you agree? If so I will check my computer and can put that in.  Thanks, Architect, Percell Miller, PA-C

## 2017-05-20 NOTE — Telephone Encounter (Signed)
Sent to [facility] at urgent

## 2017-05-20 NOTE — Telephone Encounter (Signed)
Hi Curtis Luna- thanks for the heads up.  I emailed with pt today and he is feeling better, temp is down. I will see him Monday and repeat CBC- if he is still low he will need hematology for sure.  I have asked pt to seek care if he gets sick again between now and Monday Have a wonderful trip!

## 2017-05-21 DIAGNOSIS — R361 Hematospermia: Secondary | ICD-10-CM | POA: Diagnosis not present

## 2017-05-21 DIAGNOSIS — R102 Pelvic and perineal pain: Secondary | ICD-10-CM | POA: Diagnosis not present

## 2017-05-24 NOTE — Progress Notes (Addendum)
[facility] [address], [place], [place] [address] [phone]  Date:  05/25/2017   Name:  Curtis Luna   DOB:  March 18, 1961   MRN:  [id number]  PCP:  Darreld Mclean, MD    Chief Complaint: Follow-up (Pt here for f/u visit)   History of Present Illness:  Curtis Luna is a 56 y.o. very pleasant male patient who presents with the following:  Here today to follow-up on fevers He was seen at [facility] on 6/3 with temperatures up to 103 degrees- he has already had the fever for about 4 days when he was seen at [facility].  They did labs work up but did not start any medications.  He saw Percell Miller the next day Dg Chest 2 View  Result Date: 05/18/2017 CLINICAL DATA:  Fever. EXAM: CHEST  2 VIEW COMPARISON:  None. FINDINGS: The heart size and mediastinal contours are within normal limits. Both lungs are clear. No pneumothorax or pleural effusion is noted. The visualized skeletal structures are unremarkable. IMPRESSION: No active cardiopulmonary disease. Electronically Signed   By: Marijo Conception, M.D.   On: 05/18/2017 13:24   So far his labs have looked fine except for leukopenia- will need to repeat this today  He has been fever free for 5-6 days now He still feels tired and run down He did lose 5-6 lbs; he did not feel like eating while he had the fevers.  He did drink fluids  His appetite is better but his stomach did "shrink" some while he was ill  He never had any rash, except he did have some redness of his chest and back when he was febrile No cough, mild ST but not severe No urinary symptoms He did see Dr. Nevada Crane for hematospermia issue.    No travel out of the country   He feels like he is at 75% of his full strength right now- when he was most ill he would say 20%  He was also in to see [place] on 5/24 with complaint of blood in his semen, he was given an rx for cipro.  He was not ill with fevers at that time however  Wt Readings  from Last 3 Encounters:  05/25/17 147 lb (66.7 kg)  05/18/17 150 lb 3.2 oz (68.1 kg)  05/17/17 155 lb (70.3 kg)     Patient Active Problem List   Diagnosis Date Noted  . Multiple thyroid nodules 11/05/2015  . Peripheral neuropathy 04/18/2015  . Vertigo 04/15/2015  . FH: brain aneurysm 04/15/2015  . Visit for preventive health examination 10/17/2014  . 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 Seasonal affective disorder ([place]) 10/02/2014  . Internal hemorrhoid 04/20/2014  . Chronic low back pain 04/20/2014  . Anxiety 04/20/2014  . Esophageal reflux 04/04/2011  . Diverticulosis of colon (without mention of hemorrhage) 04/04/2011    Past Medical History:  Diagnosis Date  . Abdominal pain, right lower quadrant   . Acute bronchospasm   . Backache, unspecified   . Chicken pox   . Diverticulosis of colon (without mention of hemorrhage)   . Esophageal reflux   . External hemorrhoids without mention of complication    thrombosed  . Hiatal hernia   . Inguinal hernia without mention of obstruction or gangrene, unilateral or unspecified, (not specified as recurrent)   . Migraine   . Obstructive sleep apnea (adult) (pediatric)   . Other symptoms involving  digestive system(787.99)   . Pain in joint, forearm   . Pain in joint, shoulder region   . Seasonal allergies   . Thoracic or lumbosacral neuritis or radiculitis, unspecified     Past Surgical History:  Procedure Laterality Date  . COLON RESECTION  10/06   sigmoid colon  . INGUINAL HERNIA REPAIR  12/2012   Left  . WISDOM TOOTH EXTRACTION      Social History  Substance Use Topics  . Smoking status: Former Smoker    Types: Cigarettes, Cigars    Quit date: 10/04/1987  . Smokeless tobacco: Never Used  . Alcohol use 4.2 - 8.4 oz/week    7 - 14 Standard drinks or equivalent per week     Comment: 2 daily     Family History  Problem Relation Age of Onset  . Other Father        MVA  . Lung cancer Mother 66        Deceased  . Diabetes Maternal Aunt   . Heart disease Paternal Grandmother   . Heart attack Paternal Grandmother   . Healthy Brother        x2  . Aneurysm Sister        Brain  . Allergies Daughter        x2  . Migraines Daughter        x1  . Colon cancer Neg Hx     Allergies  Allergen Reactions  . Escitalopram Hives    Pt felt anxious  Pt felt his skin was "on fire"  . Protonix [Pantoprazole Sodium] Other (See Comments)    Tingling of extremities    Medication list has been reviewed and updated.  Current Outpatient Prescriptions on File Prior to Visit  Medication Sig Dispense Refill  . celecoxib (CELEBREX) 200 MG capsule Take 1 capsule (200 mg total) by mouth daily. Use as needed for pain 90 capsule 3  . gabapentin (NEURONTIN) 100 MG capsule Take 2 capsule three times daily. 540 capsule 1   No current facility-administered medications on file prior to visit.     Review of Systems:  As per HPI- otherwise negative.   Physical Examination: Vitals:   05/25/17 1114  BP: 132/74  Pulse: 70  Temp: 98.2 F (36.8 C)   Vitals:   05/25/17 1114  Weight: 147 lb (66.7 kg)  Height: 5\' 5"  (1.651 m)   Body mass index is 24.46 kg/m. Ideal Body Weight: Weight in (lb) to have BMI = 25: 149.9  GEN: WDWN, NAD, Non-toxic, A & O x 3, looks well, normal weight.  HEENT: Atraumatic, Normocephalic. Neck supple. No masses, No LAD.  Bilateral TM wnl, oropharynx normal.  PEERL,EOMI.   No axillary or supraclavicular LAD noted  Ears and Nose: No external deformity. CV: RRR, No M/G/R. No JVD. No thrill. No extra heart sounds. PULM: CTA B, no wheezes, crackles, rhonchi. No retractions. No resp. distress. No accessory muscle use. ABD: S, NT, ND, +BS. No rebound. No HSM.  Belly benign  EXTR: No c/c/e  NEURO Normal gait.  PSYCH: Normally interactive. Conversant. Not depressed or anxious appearing.  Calm demeanor.    Assessment and Plan: FUO (fever of unknown origin) - Plan: CBC with  Differential/Platelet, Comprehensive metabolic panel, TSH, Pathologist smear review, CANCELED: Smear Review per Protocol  Leukopenia, unspecified type  Here today to follow-up leukopenia and FUO.  Fever is resolved and he is getting his strength back Will obtain labs as above and then plan next step  If leukopenia persists plan to have him se hematology   Signed Lamar Blinks, MD  Received labs   Results for orders placed or performed in visit on 05/25/17  CBC with Differential/Platelet  Result Value Ref Range   WBC 7.5 4.0 - 10.5 K/uL   RBC 4.47 4.22 - 5.81 Mil/uL   Hemoglobin 14.1 13.0 - 17.0 g/dL   HCT 41.6 39.0 - 52.0 %   MCV 93.1 78.0 - 100.0 fl   MCHC 33.8 30.0 - 36.0 g/dL   RDW 13.9 11.5 - 15.5 %   Platelets 447.0 (H) 150.0 - 400.0 K/uL   Neutrophils Relative % 52.9 43.0 - 77.0 %   Lymphocytes Relative 30.9 12.0 - 46.0 %   Monocytes Relative 14.7 (H) 3.0 - 12.0 %   Eosinophils Relative 1.0 0.0 - 5.0 %   Basophils Relative 0.5 0.0 - 3.0 %   Neutro Abs 4.0 1.4 - 7.7 K/uL   Lymphs Abs 2.3 0.7 - 4.0 K/uL   Monocytes Absolute 1.1 (H) 0.1 - 1.0 K/uL   Eosinophils Absolute 0.1 0.0 - 0.7 K/uL   Basophils Absolute 0.0 0.0 - 0.1 K/uL  Comprehensive metabolic panel  Result Value Ref Range   Sodium 138 135 - 145 mEq/L   Potassium 4.1 3.5 - 5.1 mEq/L   Chloride 104 96 - 112 mEq/L   CO2 27 19 - 32 mEq/L   Glucose, Bld 85 70 - 99 mg/dL   BUN 11 6 - 23 mg/dL   Creatinine, Ser 0.90 0.40 - 1.50 mg/dL   Total Bilirubin 0.5 0.2 - 1.2 mg/dL   Alkaline Phosphatase 45 39 - 117 U/L   AST 29 0 - 37 U/L   ALT 62 (H) 0 - 53 U/L   Total Protein 7.0 6.0 - 8.3 g/dL   Albumin 4.3 3.5 - 5.2 g/dL   Calcium 9.6 8.4 - 10.5 mg/dL   GFR 92.44 >60.00 mL/min  TSH  Result Value Ref Range   TSH 1.71 0.35 - 4.50 uIU/mL   Message to pt  Great news- your blood count is basically back to normal!  Slight elevation of your platelet count is common following an illness (due to inflammation).  One of  your liver function tests (ALT) is slightly high; this may be a fluke, or could reflect some liver irritation.  If you have been using tylenol for your fever please stop using it Otherwise I will order a liver function panel and repeat blood count for you; please have these tests done as a lab visit only in one month.   I will be in touch with your pathology blood review asap

## 2017-05-25 ENCOUNTER — Ambulatory Visit (INDEPENDENT_AMBULATORY_CARE_PROVIDER_SITE_OTHER): Payer: 59 | Admitting: Family Medicine

## 2017-05-25 VITALS — BP 132/74 | HR 70 | Temp 98.2°F | Ht 65.0 in | Wt 147.0 lb

## 2017-05-25 DIAGNOSIS — R7401 Elevation of levels of liver transaminase levels: Secondary | ICD-10-CM

## 2017-05-25 DIAGNOSIS — D72819 Decreased white blood cell count, unspecified: Secondary | ICD-10-CM | POA: Diagnosis not present

## 2017-05-25 DIAGNOSIS — R509 Fever, unspecified: Secondary | ICD-10-CM

## 2017-05-25 DIAGNOSIS — R740 Nonspecific elevation of levels of transaminase and lactic acid dehydrogenase [LDH]: Secondary | ICD-10-CM | POA: Diagnosis not present

## 2017-05-25 LAB — CBC WITH DIFFERENTIAL/PLATELET
Basophils Absolute: 0 10*3/uL (ref 0.0–0.1)
Basophils Relative: 0.5 % (ref 0.0–3.0)
Eosinophils Absolute: 0.1 10*3/uL (ref 0.0–0.7)
Eosinophils Relative: 1 % (ref 0.0–5.0)
HCT: 41.6 % (ref 39.0–52.0)
Hemoglobin: 14.1 g/dL (ref 13.0–17.0)
Lymphocytes Relative: 30.9 % (ref 12.0–46.0)
Lymphs Abs: 2.3 10*3/uL (ref 0.7–4.0)
MCHC: 33.8 g/dL (ref 30.0–36.0)
MCV: 93.1 fl (ref 78.0–100.0)
Monocytes Absolute: 1.1 10*3/uL — ABNORMAL HIGH (ref 0.1–1.0)
Monocytes Relative: 14.7 % — ABNORMAL HIGH (ref 3.0–12.0)
Neutro Abs: 4 10*3/uL (ref 1.4–7.7)
Neutrophils Relative %: 52.9 % (ref 43.0–77.0)
Platelets: 447 10*3/uL — ABNORMAL HIGH (ref 150.0–400.0)
RBC: 4.47 Mil/uL (ref 4.22–5.81)
RDW: 13.9 % (ref 11.5–15.5)
WBC: 7.5 10*3/uL (ref 4.0–10.5)

## 2017-05-25 LAB — COMPREHENSIVE METABOLIC PANEL
ALT: 62 U/L — ABNORMAL HIGH (ref 0–53)
AST: 29 U/L (ref 0–37)
Albumin: 4.3 g/dL (ref 3.5–5.2)
Alkaline Phosphatase: 45 U/L (ref 39–117)
BUN: 11 mg/dL (ref 6–23)
CO2: 27 mEq/L (ref 19–32)
Calcium: 9.6 mg/dL (ref 8.4–10.5)
Chloride: 104 mEq/L (ref 96–112)
Creatinine, Ser: 0.9 mg/dL (ref 0.40–1.50)
GFR: 92.44 mL/min (ref >60.00)
Glucose, Bld: 85 mg/dL (ref 70–99)
Potassium: 4.1 mEq/L (ref 3.5–5.1)
Sodium: 138 mEq/L (ref 135–145)
Total Bilirubin: 0.5 mg/dL (ref 0.2–1.2)
Total Protein: 7 g/dL (ref 6.0–8.3)

## 2017-05-25 LAB — TSH: TSH: 1.71 u[IU]/mL (ref 0.35–4.50)

## 2017-05-25 NOTE — Addendum Note (Signed)
Addended by: Lamar Blinks C on: 05/25/2017 06:04 PM   Modules accepted: Orders

## 2017-05-25 NOTE — Patient Instructions (Signed)
I am so glad that you are feeling better!  We will repeat labs for you today and be in touch asap

## 2017-05-26 LAB — PATHOLOGIST SMEAR REVIEW

## 2017-06-02 ENCOUNTER — Ambulatory Visit: Payer: 59 | Admitting: Neurology

## 2017-07-02 ENCOUNTER — Ambulatory Visit: Payer: 59 | Admitting: Neurology

## 2017-07-25 ENCOUNTER — Other Ambulatory Visit: Payer: Self-pay | Admitting: Neurology

## 2017-07-25 DIAGNOSIS — G6289 Other specified polyneuropathies: Secondary | ICD-10-CM

## 2017-08-06 ENCOUNTER — Telehealth: Payer: Self-pay | Admitting: Gastroenterology

## 2017-08-06 NOTE — Telephone Encounter (Signed)
Patient has been notified of the switch and has been scheduled for an office visit with Dr.Perry.

## 2017-08-06 NOTE — Telephone Encounter (Signed)
Dr. Henrene Pastor and [facility] Plan, do you approve of change?

## 2017-08-06 NOTE — Telephone Encounter (Signed)
Sure

## 2017-08-06 NOTE — Telephone Encounter (Signed)
OK 

## 2017-09-30 ENCOUNTER — Encounter: Payer: Self-pay | Admitting: Family Medicine

## 2017-10-01 ENCOUNTER — Ambulatory Visit (INDEPENDENT_AMBULATORY_CARE_PROVIDER_SITE_OTHER): Payer: 59 | Admitting: Internal Medicine

## 2017-10-01 ENCOUNTER — Other Ambulatory Visit (INDEPENDENT_AMBULATORY_CARE_PROVIDER_SITE_OTHER): Payer: 59

## 2017-10-01 ENCOUNTER — Encounter: Payer: Self-pay | Admitting: Internal Medicine

## 2017-10-01 VITALS — BP 132/66 | HR 64 | Ht 65.0 in | Wt 155.0 lb

## 2017-10-01 DIAGNOSIS — R109 Unspecified abdominal pain: Secondary | ICD-10-CM

## 2017-10-01 DIAGNOSIS — R197 Diarrhea, unspecified: Secondary | ICD-10-CM

## 2017-10-01 DIAGNOSIS — R945 Abnormal results of liver function studies: Secondary | ICD-10-CM

## 2017-10-01 DIAGNOSIS — K5730 Diverticulosis of large intestine without perforation or abscess without bleeding: Secondary | ICD-10-CM

## 2017-10-01 DIAGNOSIS — R7989 Other specified abnormal findings of blood chemistry: Secondary | ICD-10-CM

## 2017-10-01 LAB — HEPATIC FUNCTION PANEL
ALT: 21 U/L (ref 0–53)
AST: 23 U/L (ref 0–37)
Albumin: 4.5 g/dL (ref 3.5–5.2)
Alkaline Phosphatase: 46 U/L (ref 39–117)
Bilirubin, Direct: 0.1 mg/dL (ref 0.0–0.3)
Total Bilirubin: 0.3 mg/dL (ref 0.2–1.2)
Total Protein: 7.4 g/dL (ref 6.0–8.3)

## 2017-10-01 LAB — IGA: IgA: 197 mg/dL (ref 68–378)

## 2017-10-01 NOTE — Patient Instructions (Signed)
We have sent the following medications to your pharmacy for you to pick up at your convenience:  TTG, IGA, LFT's   Take 1 tablespoon of Metamucil daily  Please follow up in 3 months

## 2017-10-01 NOTE — Progress Notes (Signed)
HISTORY OF PRESENT ILLNESS:  Curtis Luna is a 56 y.o. male, [place] native, with past medical history as listed below who is sent by his primary care provider Dr. Edilia Bo for evaluation of intermittent nocturnal abdominal discomfort with diarrhea. Patient has been seen in this office on 2 prior occasions. First with Dr. Sharlett Iles regarding colon cancer screening and GERD. Colonoscopy and upper endoscopy were performed 04/04/2011. Colonoscopy revealed severe pandiverticulosis but was otherwise negative. Patient has had prior sigmoid colon resection for diverticular disease. Upper endoscopy was normal. He is on no medication for GERD. Currently denies GERD symptoms except for rare indigestion. No dysphagia. Next, he was seen by Dr. Fuller Plan June 2015 regarding internal hemorrhoids. Symptomatic therapies provided. His current problems began approximately 18 months ago. He describes to me that during the night he will develop abdominal pain followed by the need to go to the bathroom at which point he has several bouts of diarrhea. This relieves his abdominal discomfort. He seems to think that his symptom complex occurs exclusively when he is sleeping on his left side. He is generally awoken approximately 2:30 AM. This problem seems to occur several times per month. He has had no difficulties over the past month. No change in frequency or severity. There has been no bleeding or weight loss. For the most part he has one or 2 formed bowel. No problems with abdominal discomfort movements during the day. No problems with abdominal discomfort at any other time. GI review of systems is otherwise negative. He drinks approximately 2 beers per day. Review of outside laboratories from 05/25/2017 finds a unremarkable comprehensive metabolic panel except for elevated ALT at 62. Earlier that month elevated AST at 42. Unremarkable CBC with hemoglobin 14.1. Normal TSH. No outside radiology. Endoscopy reports reviewed as outlined  above  REVIEW OF SYSTEMS:  All non-GI ROS negative unless otherwise stated in the history of present illness except for allergies, anxiety, back pain  Past Medical History:  Diagnosis Date  . Abdominal pain, right lower quadrant   . Acute bronchospasm   . Backache, unspecified   . Chicken pox   . Diverticulosis of colon (without mention of hemorrhage)   . Esophageal reflux   . External hemorrhoids without mention of complication    thrombosed  . Hiatal hernia   . Inguinal hernia without mention of obstruction or gangrene, unilateral or unspecified, (not specified as recurrent)   . Migraine   . Obstructive sleep apnea (adult) (pediatric)   . Other symptoms involving digestive system(787.99)   . Pain in joint, forearm   . Pain in joint, shoulder region   . Seasonal allergies   . Thoracic or lumbosacral neuritis or radiculitis, unspecified     Past Surgical History:  Procedure Laterality Date  . COLON RESECTION  10/06   sigmoid colon  . INGUINAL HERNIA REPAIR  12/2012   Left  . WISDOM TOOTH EXTRACTION      Social History HISAO DOO  reports that he quit smoking about 30 years ago. His smoking use included Cigarettes and Cigars. He has never used smokeless tobacco. He reports that he drinks about 4.2 - 8.4 oz of alcohol per week . He reports that he does not use drugs.  family history includes Allergies in his daughter; Aneurysm in his sister; Diabetes in his maternal aunt; Healthy in his brother; Heart attack in his paternal grandmother; Heart disease in his paternal grandmother; Lung cancer (age of onset: 31) in his mother; Migraines in his daughter;  Other in his father.  Allergies  Allergen Reactions  . Escitalopram Hives    Pt felt anxious  Pt felt his skin was "on fire"  . Protonix [Pantoprazole Sodium] Other (See Comments)    Tingling of extremities       PHYSICAL EXAMINATION: Vital signs: BP 132/66   Pulse 64   Ht 5\' 5"  (1.651 m)   Wt 155 lb (70.3 kg)    BMI 25.79 kg/m   Constitutional: generally well-appearing, no acute distress Psychiatric: alert and oriented x3, cooperative Eyes: extraocular movements intact, anicteric, conjunctiva pink Mouth: oral pharynx moist, no lesions Neck: supple without thyromegaly Lymph: no lymphadenopathy Cardiovascular: heart regular rate and rhythm, no murmur Lungs: clear to auscultation bilaterally Abdomen: soft, nontender, nondistended, no obvious ascites, no peritoneal signs, normal bowel sounds, no organomegaly. Prior surgical incisions well-healed Rectal: Omitted Extremities: no clubbing, cyanosis, or lower extremity edema bilaterally Skin: no lesions on visible extremities Neuro: No focal deficits. Cranial nerves intact. No asterixis.    ASSESSMENT:  #1. Intermittent problems with nocturnal abdominal discomfort followed by diarrhea. Etiology unclear. No alarm features. Symptoms have not accelerated in either severity or frequency #2. History of diverticulosis and complicated diverticulitis status post sigmoid colectomy 2006 #3. Colonoscopy 2012 with diverticulosis #4. GERD. Normal upper endoscopy 2012 #5. Mild elevation of transaminases. Rule out chronic liver problem. Rule out celiac  PLAN:  #1. Tissue transglutaminase antibody IgA and serum IgA level #2. Repeat LFTs. If transaminase abnormalities persist then extend workup #3. For bowels initiate Metamucil 1 tablespoon daily #4. We did discuss possible colonoscopy. Will hold off for now but will plan follow-up #5. Office follow-up in 3 months. Contact the office in the interim for new or worsening symptoms as advised  A copy of this consultation note has been sent to Dr. Edilia Bo

## 2017-10-02 LAB — TISSUE TRANSGLUTAMINASE, IGA: (tTG) Ab, IgA: 1 U/mL

## 2017-11-14 NOTE — Progress Notes (Addendum)
[facility] [address], [place], [place] [address] [phone]  Date:  11/16/2017   Name:  Curtis Luna   DOB:  [birth year]   MRN:  [id number]  PCP:  Darreld Mclean, MD    Chief Complaint: Annual Exam (Pt here for CPE and fasting labs. Declined flu vaccine at this time. )   History of Present Illness:  Curtis Luna is a 56 y.o. very pleasant male patient who presents with the following:  Here today for a CPE History of thyroid nodules, ED, GERD, peripheral neuropathy He saw his GI recently for abd pain, they did labs and plan to follow-up in 3 months  He saw his urologist- Dr Cecilie Lowers- just in June for blood in semen He was having fevers and neutropenia back in June-  He had a CXR, HIV- all ok.  We repeated his CBC and it came back to normal   His fevers did not return  Colon: 2012 Flu: declines today Tetanus: 2011 PSA: done in May Labs: some done over the summer  For his cholesterol he had been taking pravastatin, but he started to have some knee pains.  He came off his pravastatin a couple of months ago.  Would like to see how this looks today. If not good he is willing to try the pravastatin again   He is fasting today for labs He is exercising a bit more than last year- he walking 3x a week and hopes to increase this as they recently got a puppy.  They did have to put their older dog down this fall as he had cancer.  They tried amputating the leg to cure the cancer but this was not successful unfortunately  No issues with CP or SOB His mood has been good- no issues with depression or hopelessness  He uses his ativan perhaps 3x a month- not very frequently. He generally uses it when he has vertigo- the ativan   He uses celebrex during golf season but does not need right now He is doing some massage which does help with his lower back pain  He is still on neurontin for his neuropathy and this continues to  work well for him He does have thyroid nodules and we would like to repeat his thyroid US  Patient Active Problem List   Diagnosis Date Noted  . Multiple thyroid nodules 11/05/2015  . Peripheral neuropathy 04/18/2015  . Vertigo 04/15/2015  . FH: brain aneurysm 04/15/2015  . Visit for preventive health examination 10/17/2014  . 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 Seasonal affective disorder ([place]) 10/02/2014  . Internal hemorrhoid 04/20/2014  . Chronic low back pain 04/20/2014  . Anxiety 04/20/2014  . Esophageal reflux 04/04/2011  . Diverticulosis of colon (without mention of hemorrhage) 04/04/2011    Past Medical History:  Diagnosis Date  . Abdominal pain, right lower quadrant   . Acute bronchospasm   . Backache, unspecified   . Chicken pox   . Diverticulosis of colon (without mention of hemorrhage)   . Esophageal reflux   . External hemorrhoids without mention of complication    thrombosed  . Hiatal hernia   . Inguinal hernia without mention of obstruction or gangrene, unilateral or unspecified, (not specified as recurrent)   . Migraine   . Obstructive sleep apnea (adult) (pediatric)   . Other symptoms involving digestive system(787.99)   . Pain in joint, forearm   .  Pain in joint, shoulder region   . Seasonal allergies   . Thoracic or lumbosacral neuritis or radiculitis, unspecified     Past Surgical History:  Procedure Laterality Date  . COLON RESECTION  10/06   sigmoid colon  . INGUINAL HERNIA REPAIR  12/2012   Left  . WISDOM TOOTH EXTRACTION      Social History   Tobacco Use  . Smoking status: Former Smoker    Types: Cigarettes, Cigars    Last attempt to quit: 10/04/1987    Years since quitting: 30.1  . Smokeless tobacco: Never Used  Substance Use Topics  . Alcohol use: Yes    Alcohol/week: 4.2 - 8.4 oz    Types: 7 - 14 Standard drinks or equivalent per week    Comment: 2 daily   . Drug use: No    Family History  Problem Relation  Age of Onset  . Other Father        MVA  . Lung cancer Mother 30       Deceased  . Diabetes Maternal Aunt   . Heart disease Paternal Grandmother   . Heart attack Paternal Grandmother   . Healthy Brother        x2  . Aneurysm Sister        Brain  . Allergies Daughter        x2  . Migraines Daughter        x1  . Colon cancer Neg Hx     Allergies  Allergen Reactions  . Escitalopram Hives    Pt felt anxious  Pt felt his skin was "on fire"  . Protonix [Pantoprazole Sodium] Other (See Comments)    Tingling of extremities    Medication list has been reviewed and updated.  Current Outpatient Medications on File Prior to Visit  Medication Sig Dispense Refill  . celecoxib (CELEBREX) 200 MG capsule Take 1 capsule (200 mg total) by mouth daily. Use as needed for pain 90 capsule 3  . gabapentin (NEURONTIN) 100 MG capsule Take 2 capsule three times daily. 540 capsule 1  . LORazepam (ATIVAN) 1 MG tablet Take 1 mg by mouth as needed for anxiety.     No current facility-administered medications on file prior to visit.     Review of Systems:  As per HPI- otherwise negative. No fever or chills No CP or SOB Feeling well overall    Physical Examination: Vitals:   11/16/17 0934  BP: 112/72  Pulse: 69  Temp: 97.7 F (36.5 C)  SpO2: 98%   Vitals:   11/16/17 0934  Weight: 150 lb 9.6 oz (68.3 kg)  Height: 5\' 6"  (1.676 m)   Body mass index is 24.31 kg/m. Ideal Body Weight: Weight in (lb) to have BMI = 25: 154.6  GEN: WDWN, NAD, Non-toxic, A & O x 3, looks well, normal weight HEENT: Atraumatic, Normocephalic. Neck supple. No masses, No LAD.  Bilateral TM wnl, oropharynx normal.  PEERL,EOMI.   Ears and Nose: No external deformity. CV: RRR, No M/G/R. No JVD. No thrill. No extra heart sounds. PULM: CTA B, no wheezes, crackles, rhonchi. No retractions. No resp. distress. No accessory muscle use. ABD: S, NT, ND, +BS. No rebound. No HSM. EXTR: No c/c/e NEURO Normal gait.   PSYCH: Normally interactive. Conversant. Not depressed or anxious appearing.  Calm demeanor.    Assessment and Plan: Physical exam  Screening for diabetes mellitus - Plan: Comprehensive metabolic panel  Multiple thyroid nodules - Plan: US THYROID, TSH, Hemoglobin  A1c  Dyslipidemia - Plan: Lipid panel  Leukopenia, unspecified type - Plan: CBC  CPE today Update labs as above He does not need any refills today Order thyroid US for him  Pending cholesterol will decide about going back on pravachol- may try a lower dose or intermittent dosing as needed    Signed Lamar Blinks, MD  Received his labs 12/4  Your labs all look great Metabolic profile is normal Blood count is normal  Thyroid normal A1c is normal- no sign of diabetes or pre-diabetes.   Your cholesterol is better- specifically, your triglycerides are lower than in the past.  It is hard to tell how much of this is left over effect from the pravachol and how much might be diet related   If you like, I can order a cholesterol panel for you to have drawn in 4 months or so.  We could also have you try using the pravachol on a lower dose or every other day, etc.  What are your thoughts? Results for orders placed or performed in visit on 11/16/17  Comprehensive metabolic panel  Result Value Ref Range   Sodium 138 135 - 145 mEq/L   Potassium 4.1 3.5 - 5.1 mEq/L   Chloride 104 96 - 112 mEq/L   CO2 27 19 - 32 mEq/L   Glucose, Bld 92 70 - 99 mg/dL   BUN 15 6 - 23 mg/dL   Creatinine, Ser 0.92 0.40 - 1.50 mg/dL   Total Bilirubin 0.5 0.2 - 1.2 mg/dL   Alkaline Phosphatase 43 39 - 117 U/L   AST 26 0 - 37 U/L   ALT 19 0 - 53 U/L   Total Protein 7.0 6.0 - 8.3 g/dL   Albumin 4.4 3.5 - 5.2 g/dL   Calcium 9.8 8.4 - 10.5 mg/dL   GFR 89.97 >60.00 mL/min  CBC  Result Value Ref Range   WBC 5.1 4.0 - 10.5 K/uL   RBC 4.54 4.22 - 5.81 Mil/uL   Platelets 300.0 150.0 - 400.0 K/uL   Hemoglobin 14.5 13.0 - 17.0 g/dL   HCT 43.2  39.0 - 52.0 %   MCV 95.2 78.0 - 100.0 fl   MCHC 33.6 30.0 - 36.0 g/dL   RDW 14.1 11.5 - 15.5 %  Lipid panel  Result Value Ref Range   Cholesterol 186 0 - 200 mg/dL   Triglycerides 89.0 0.0 - 149.0 mg/dL   HDL 46.70 >39.00 mg/dL   VLDL 17.8 0.0 - 40.0 mg/dL   LDL Cholesterol 121 (H) 0 - 99 mg/dL   Total CHOL/HDL Ratio 4    NonHDL 138.88   TSH  Result Value Ref Range   TSH 1.49 0.35 - 4.50 uIU/mL  Hemoglobin A1c  Result Value Ref Range   Hgb A1c MFr Bld 5.5 4.6 - 6.5 %   Message to pt

## 2017-11-16 ENCOUNTER — Encounter: Payer: Self-pay | Admitting: Family Medicine

## 2017-11-16 ENCOUNTER — Ambulatory Visit (INDEPENDENT_AMBULATORY_CARE_PROVIDER_SITE_OTHER): Payer: 59 | Admitting: Family Medicine

## 2017-11-16 ENCOUNTER — Other Ambulatory Visit (INDEPENDENT_AMBULATORY_CARE_PROVIDER_SITE_OTHER): Payer: 59

## 2017-11-16 VITALS — BP 112/72 | HR 69 | Temp 97.7°F | Ht 66.0 in | Wt 150.6 lb

## 2017-11-16 DIAGNOSIS — E042 Nontoxic multinodular goiter: Secondary | ICD-10-CM

## 2017-11-16 DIAGNOSIS — E785 Hyperlipidemia, unspecified: Secondary | ICD-10-CM | POA: Diagnosis not present

## 2017-11-16 DIAGNOSIS — Z131 Encounter for screening for diabetes mellitus: Secondary | ICD-10-CM | POA: Diagnosis not present

## 2017-11-16 DIAGNOSIS — Z0000 Encounter for general adult medical examination without abnormal findings: Secondary | ICD-10-CM

## 2017-11-16 DIAGNOSIS — D72819 Decreased white blood cell count, unspecified: Secondary | ICD-10-CM

## 2017-11-16 LAB — LIPID PANEL
Cholesterol: 186 mg/dL (ref 0–200)
HDL: 46.7 mg/dL (ref >39.00)
LDL Cholesterol: 121 mg/dL — ABNORMAL HIGH (ref 0–99)
NonHDL: 138.88
Total CHOL/HDL Ratio: 4
Triglycerides: 89 mg/dL (ref 0.0–149.0)
VLDL: 17.8 mg/dL (ref 0.0–40.0)

## 2017-11-16 LAB — COMPREHENSIVE METABOLIC PANEL
ALT: 19 U/L (ref 0–53)
AST: 26 U/L (ref 0–37)
Albumin: 4.4 g/dL (ref 3.5–5.2)
Alkaline Phosphatase: 43 U/L (ref 39–117)
BUN: 15 mg/dL (ref 6–23)
CO2: 27 mEq/L (ref 19–32)
Calcium: 9.8 mg/dL (ref 8.4–10.5)
Chloride: 104 mEq/L (ref 96–112)
Creatinine, Ser: 0.92 mg/dL (ref 0.40–1.50)
GFR: 89.97 mL/min (ref >60.00)
Glucose, Bld: 92 mg/dL (ref 70–99)
Potassium: 4.1 mEq/L (ref 3.5–5.1)
Sodium: 138 mEq/L (ref 135–145)
Total Bilirubin: 0.5 mg/dL (ref 0.2–1.2)
Total Protein: 7 g/dL (ref 6.0–8.3)

## 2017-11-16 LAB — CBC
HCT: 43.2 % (ref 39.0–52.0)
Hemoglobin: 14.5 g/dL (ref 13.0–17.0)
MCHC: 33.6 g/dL (ref 30.0–36.0)
MCV: 95.2 fl (ref 78.0–100.0)
Platelets: 300 10*3/uL (ref 150.0–400.0)
RBC: 4.54 Mil/uL (ref 4.22–5.81)
RDW: 14.1 % (ref 11.5–15.5)
WBC: 5.1 10*3/uL (ref 4.0–10.5)

## 2017-11-16 LAB — CBC WITH DIFFERENTIAL/PLATELET
Basophils Absolute: 0 10*3/uL (ref 0.0–0.1)
Basophils Relative: 0.7 % (ref 0.0–3.0)
Eosinophils Absolute: 0.1 10*3/uL (ref 0.0–0.7)
Eosinophils Relative: 2.6 % (ref 0.0–5.0)
HCT: 43.1 % (ref 39.0–52.0)
Hemoglobin: 14.2 g/dL (ref 13.0–17.0)
Lymphocytes Relative: 34.2 % (ref 12.0–46.0)
Lymphs Abs: 1.7 10*3/uL (ref 0.7–4.0)
MCHC: 32.9 g/dL (ref 30.0–36.0)
MCV: 95.2 fl (ref 78.0–100.0)
Monocytes Absolute: 0.6 10*3/uL (ref 0.1–1.0)
Monocytes Relative: 12.1 % — ABNORMAL HIGH (ref 3.0–12.0)
Neutro Abs: 2.6 10*3/uL (ref 1.4–7.7)
Neutrophils Relative %: 50.4 % (ref 43.0–77.0)
Platelets: 298 10*3/uL (ref 150.0–400.0)
RBC: 4.52 Mil/uL (ref 4.22–5.81)
RDW: 14.1 % (ref 11.5–15.5)
WBC: 5.1 10*3/uL (ref 4.0–10.5)

## 2017-11-16 LAB — HEMOGLOBIN A1C
Hgb A1c MFr Bld: 5.5 % (ref 4.6–6.5)
Hgb A1c MFr Bld: 5.6 % (ref 4.6–6.5)

## 2017-11-16 LAB — TSH
TSH: 1.49 u[IU]/mL (ref 0.35–4.50)
TSH: 1.49 u[IU]/mL (ref 0.35–4.50)

## 2017-11-16 NOTE — Patient Instructions (Signed)
It was a pleasure to see you today- enjoy your new puppy!  I will be in touch with your labs We might consider trying the pravastatin again- if aches return we can try a lower dose or less frequent dosing I am going to arrange a thyroid US for you  Take care!    Health Maintenance, Male A healthy lifestyle and preventive care is important for your health and wellness. Ask your health care provider about what schedule of regular examinations is right for you. What should I know about weight and diet? Eat a Healthy Diet  Eat plenty of vegetables, fruits, whole grains, low-fat dairy products, and lean protein.  Do not eat a lot of foods high in solid fats, added sugars, or salt.  Maintain a Healthy Weight Regular exercise can help you achieve or maintain a healthy weight. You should:  Do at least 150 minutes of exercise each week. The exercise should increase your heart rate and make you sweat (moderate-intensity exercise).  Do strength-training exercises at least twice a week.  Watch Your Levels of Cholesterol and Blood Lipids  Have your blood tested for lipids and cholesterol every 5 years starting at 57 years of age. If you are at high risk for heart disease, you should start having your blood tested when you are 57 years old. You may need to have your cholesterol levels checked more often if: ? Your lipid or cholesterol levels are high. ? You are older than 57 years of age. ? You are at high risk for heart disease.  What should I know about cancer screening? Many types of cancers can be detected early and may often be prevented. Lung Cancer  You should be screened every year for lung cancer if: ? You are a current smoker who has smoked for at least 30 years. ? You are a former smoker who has quit within the past 15 years.  Talk to your health care provider about your screening options, when you should start screening, and how often you should be screened.  Colorectal  Cancer  Routine colorectal cancer screening usually begins at 57 years of age and should be repeated every 5-10 years until you are 57 years old. You may need to be screened more often if early forms of precancerous polyps or small growths are found. Your health care provider may recommend screening at an earlier age if you have risk factors for colon cancer.  Your health care provider may recommend using home test kits to check for hidden blood in the stool.  A small camera at the end of a tube can be used to examine your colon (sigmoidoscopy or colonoscopy). This checks for the earliest forms of colorectal cancer.  Prostate and Testicular Cancer  Depending on your age and overall health, your health care provider may do certain tests to screen for prostate and testicular cancer.  Talk to your health care provider about any symptoms or concerns you have about testicular or prostate cancer.  Skin Cancer  Check your skin from head to toe regularly.  Tell your health care provider about any new moles or changes in moles, especially if: ? There is a change in a mole's size, shape, or color. ? You have a mole that is larger than a pencil eraser.  Always use sunscreen. Apply sunscreen liberally and repeat throughout the day.  Protect yourself by wearing long sleeves, pants, a wide-brimmed hat, and sunglasses when outside.  What should I know about heart  disease, diabetes, and high blood pressure?  If you are 5-35 years of age, have your blood pressure checked every 3-5 years. If you are 5 years of age or older, have your blood pressure checked every year. You should have your blood pressure measured twice-once when you are at a hospital or clinic, and once when you are not at a hospital or clinic. Record the average of the two measurements. To check your blood pressure when you are not at a hospital or clinic, you can use: ? An automated blood pressure machine at a pharmacy. ? A home blood  pressure monitor.  Talk to your health care provider about your target blood pressure.  If you are between 30-33 years old, ask your health care provider if you should take aspirin to prevent heart disease.  Have regular diabetes screenings by checking your fasting blood sugar level. ? If you are at a normal weight and have a low risk for diabetes, have this test once every three years after the age of 48. ? If you are overweight and have a high risk for diabetes, consider being tested at a younger age or more often.  A one-time screening for abdominal aortic aneurysm (AAA) by ultrasound is recommended for men aged 9-75 years who are current or former smokers. What should I know about preventing infection? Hepatitis B If you have a higher risk for hepatitis B, you should be screened for this virus. Talk with your health care provider to find out if you are at risk for hepatitis B infection. Hepatitis C Blood testing is recommended for:  Everyone born from [birth year] through 1966.  Anyone with known risk factors for hepatitis C.  Sexually Transmitted Diseases (STDs)  You should be screened each year for STDs including gonorrhea and chlamydia if: ? You are sexually active and are younger than 57 years of age. ? You are older than 57 years of age and your health care provider tells you that you are at risk for this type of infection. ? Your sexual activity has changed since you were last screened and you are at an increased risk for chlamydia or gonorrhea. Ask your health care provider if you are at risk.  Talk with your health care provider about whether you are at high risk of being infected with HIV. Your health care provider may recommend a prescription medicine to help prevent HIV infection.  What else can I do?  Schedule regular health, dental, and eye exams.  Stay current with your vaccines (immunizations).  Do not use any tobacco products, such as cigarettes, chewing tobacco, and  e-cigarettes. If you need help quitting, ask your health care provider.  Limit alcohol intake to no more than 2 drinks per day. One drink equals 12 ounces of beer, 5 ounces of wine, or 1 ounces of hard liquor.  Do not use street drugs.  Do not share needles.  Ask your health care provider for help if you need support or information about quitting drugs.  Tell your health care provider if you often feel depressed.  Tell your health care provider if you have ever been abused or do not feel safe at home. This information is not intended to replace advice given to you by your health care provider. Make sure you discuss any questions you have with your health care provider. Document Released: 05/29/2008 Document Revised: 07/30/2016 Document Reviewed: 09/04/2015 Elsevier Interactive Patient Education  [employer].

## 2017-11-17 ENCOUNTER — Encounter: Payer: Self-pay | Admitting: Family Medicine

## 2017-11-17 DIAGNOSIS — E785 Hyperlipidemia, unspecified: Secondary | ICD-10-CM

## 2017-11-30 ENCOUNTER — Telehealth: Payer: Self-pay | Admitting: Neurology

## 2017-11-30 NOTE — Telephone Encounter (Signed)
[facility] called in regards to pt's prescription for gabapentin CB# [phone] OPT#2

## 2017-12-01 ENCOUNTER — Other Ambulatory Visit: Payer: Self-pay | Admitting: Family Medicine

## 2017-12-01 DIAGNOSIS — E785 Hyperlipidemia, unspecified: Secondary | ICD-10-CM

## 2017-12-01 IMAGING — CR DG CHEST 2V
2 series · 2 of 2 positions shown · non-contrast
Comparison: None.

CLINICAL DATA: Fever.

EXAM:
CHEST  2 VIEW

[w chest pa]
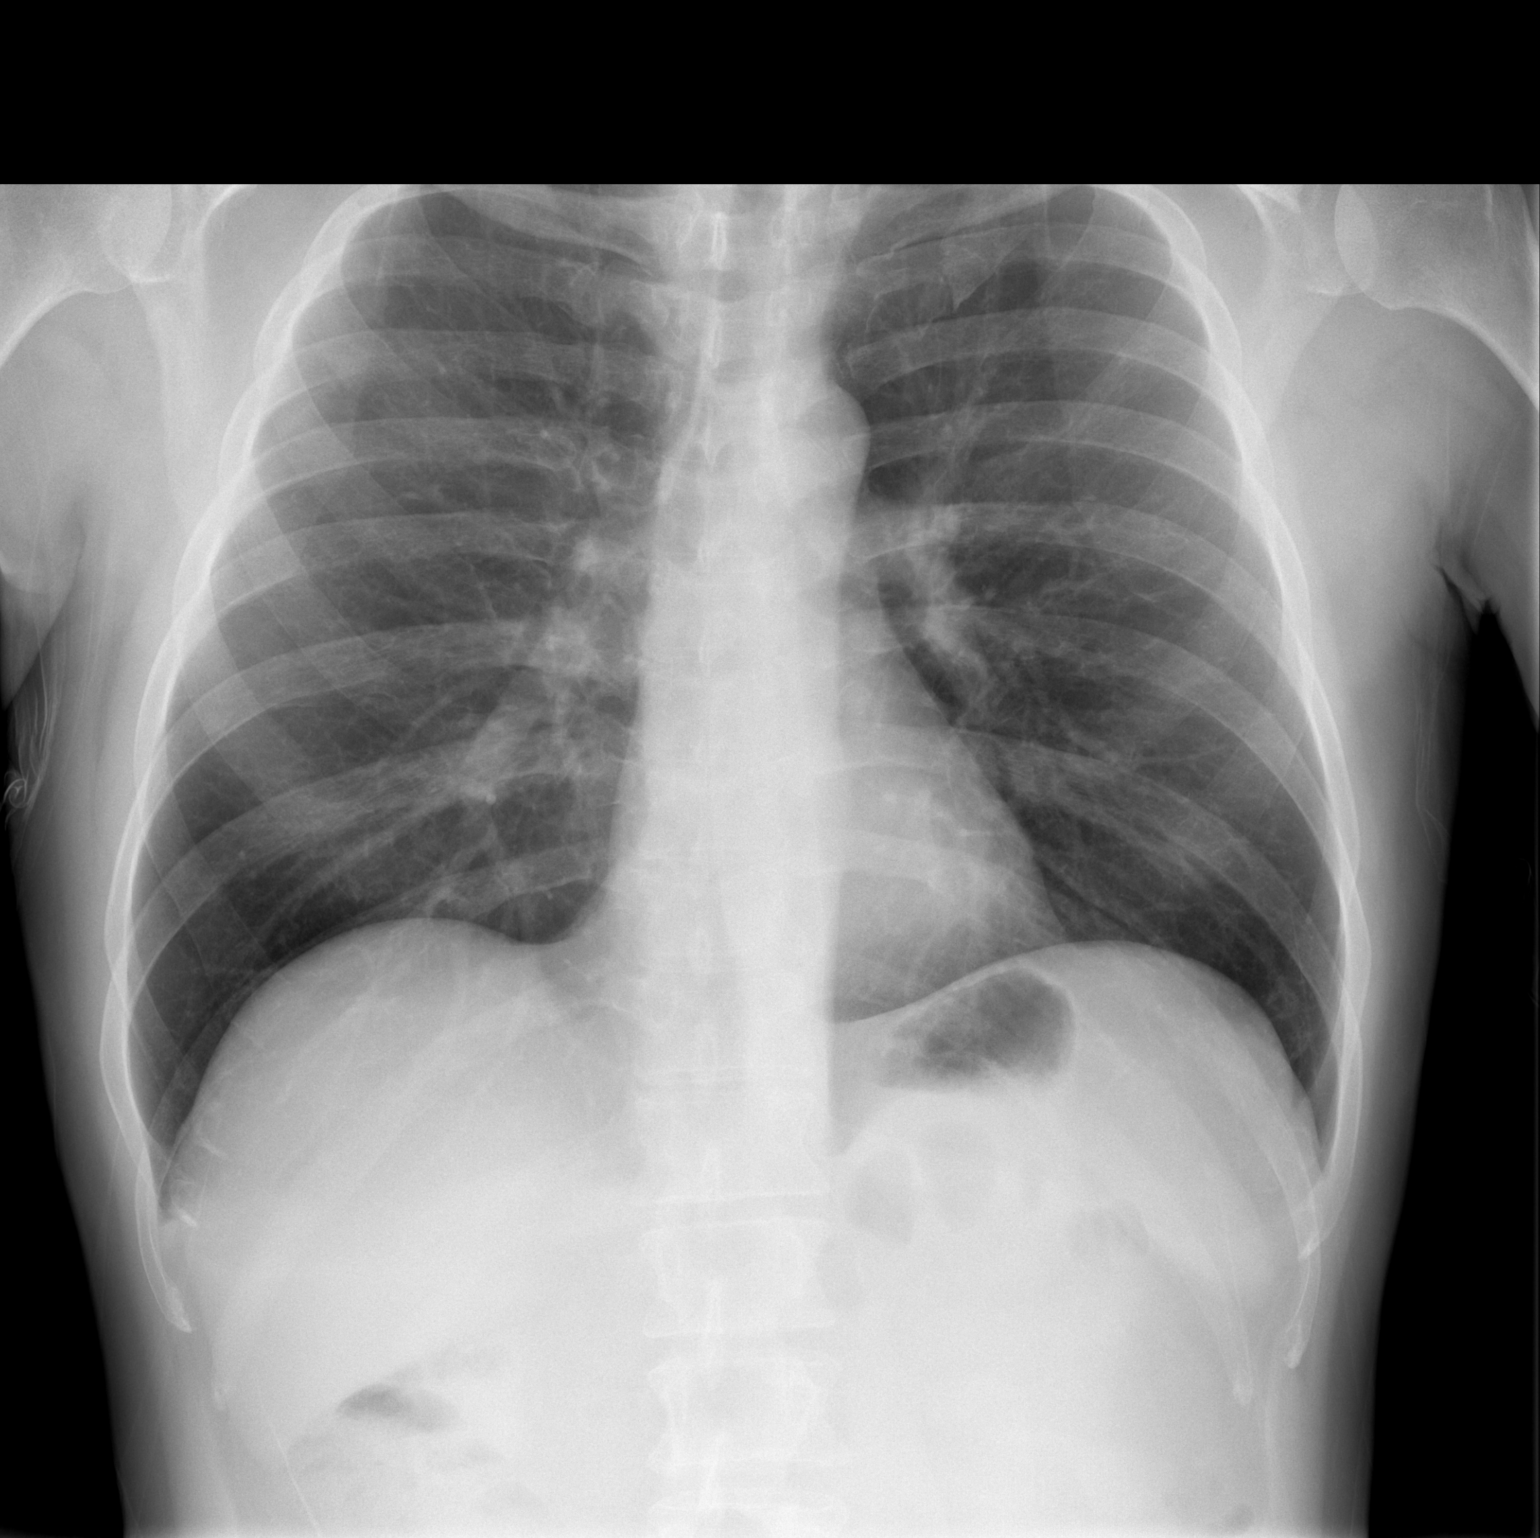

[w chest lat]
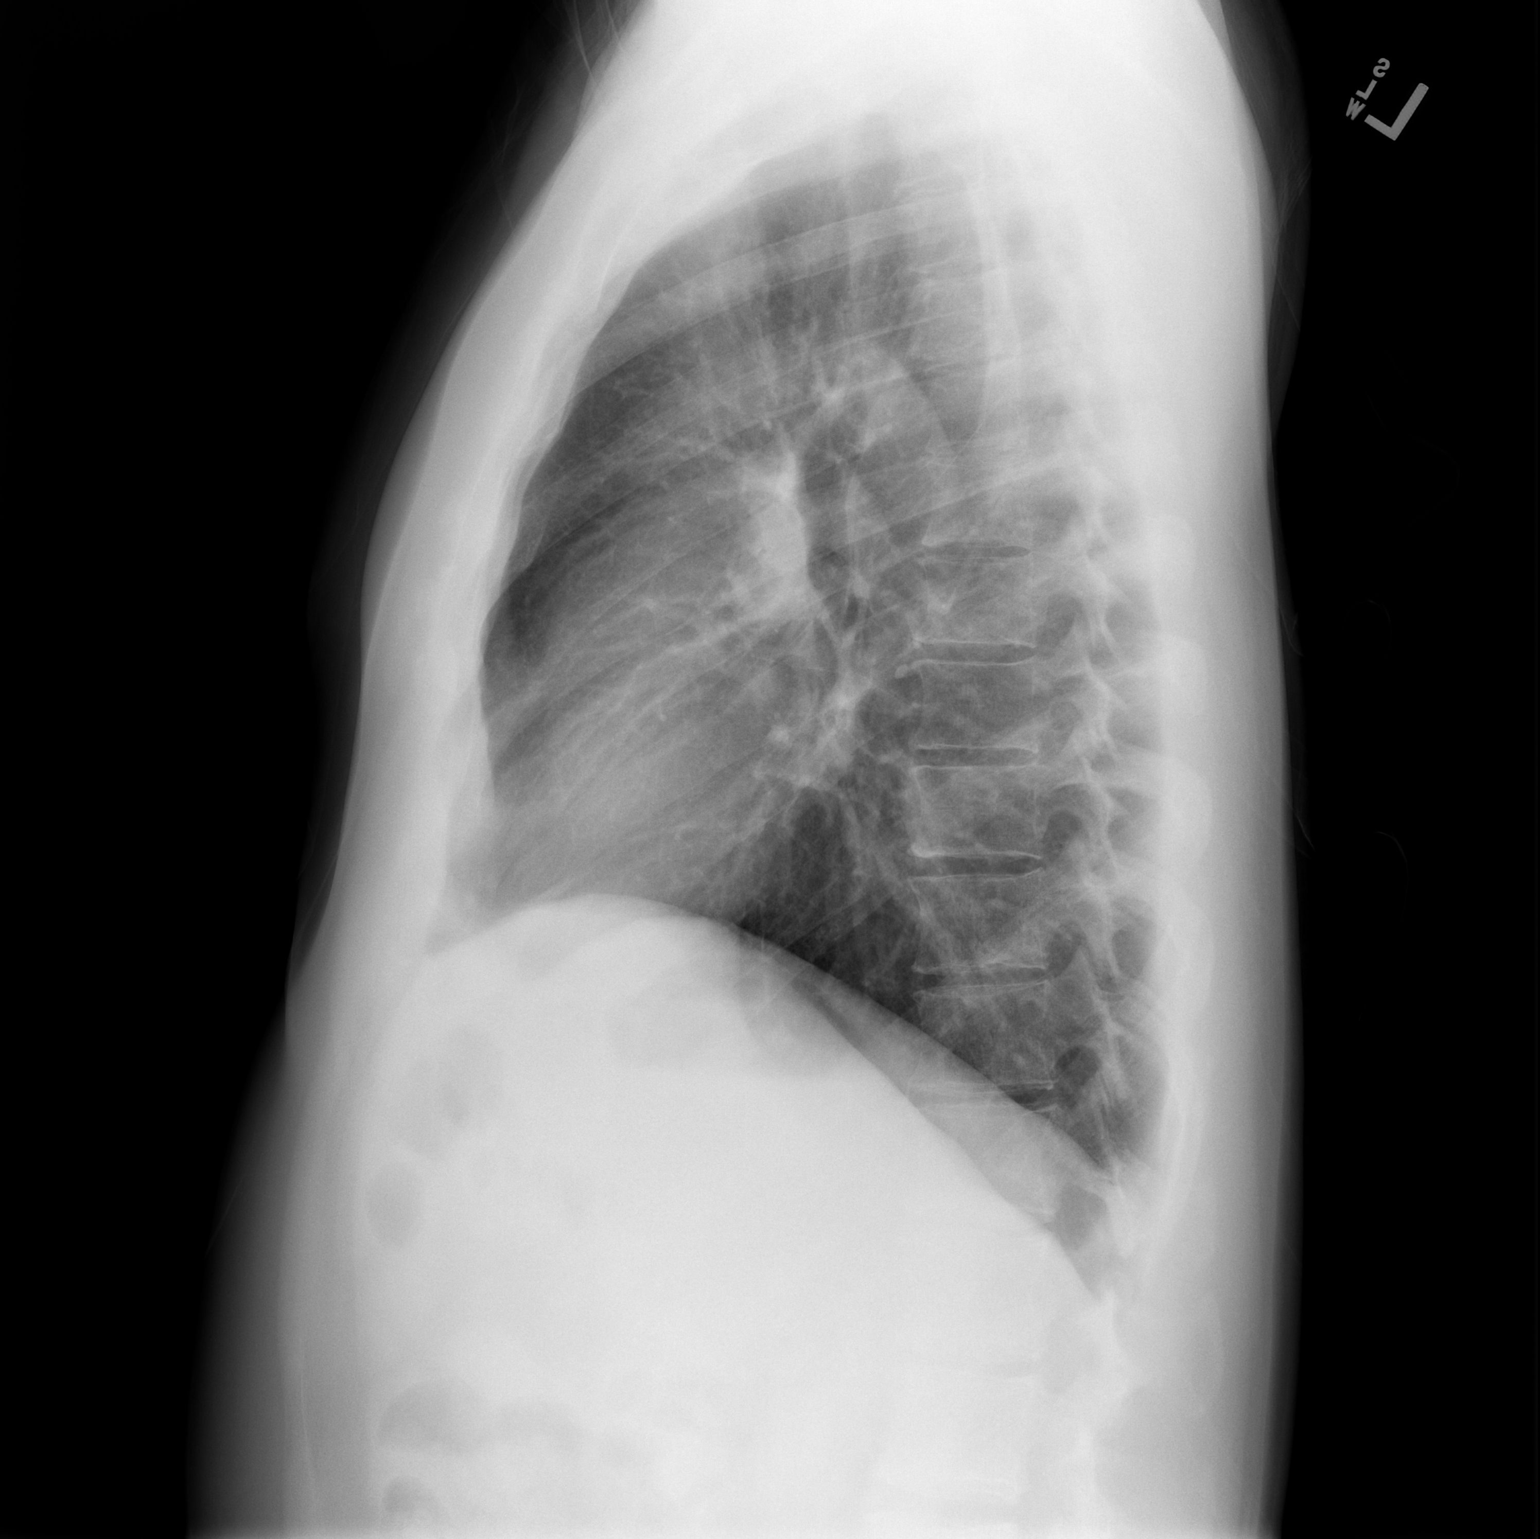

[2 of 2 positions shown; findings below may reference images not displayed]

FINDINGS: The heart size and mediastinal contours are within normal limits.
Both lungs are clear. No pneumothorax or pleural effusion is noted.
The visualized skeletal structures are unremarkable.
IMPRESSION: No active cardiopulmonary disease.

## 2017-12-01 NOTE — Telephone Encounter (Signed)
Called and spoke with Pt, advsd he will need to make appt before this can be refilled. Confirmed dosage, Pt taking 1am, 2pm 100mg 

## 2017-12-02 ENCOUNTER — Telehealth: Payer: Self-pay | Admitting: Neurology

## 2017-12-02 MED ORDER — GABAPENTIN 100 MG PO CAPS: 100.0000 mg | ORAL_CAPSULE | Freq: Three times a day (TID) | ORAL | 1 refills | Status: DC

## 2017-12-02 NOTE — Telephone Encounter (Signed)
Patient made a Follow Up appointment but not until 03/2018. Can he have refills until next appointment? Please Advise. Thanks

## 2017-12-02 NOTE — Telephone Encounter (Signed)
Called Pt, advsd sent in Rx to [facility], advsd him he must keep appt.

## 2017-12-03 ENCOUNTER — Telehealth: Payer: Self-pay | Admitting: Neurology

## 2017-12-03 NOTE — Telephone Encounter (Signed)
Called Pt, advsd him I sent the Rx electronically yesterday  12/02/17 and it was confirmed rcvd by pharmacy at 2:03pm. Pt to call them back

## 2017-12-03 NOTE — Telephone Encounter (Signed)
Pt called and said the pharmacy has not received his prescription request yet, was told it was called in yesterday

## 2017-12-03 NOTE — Telephone Encounter (Signed)
Called Pt and advsd  

## 2017-12-03 NOTE — Telephone Encounter (Signed)
Rcvd fax for Pts gabapentin,Called [facility], spoke with Elyse (pharmacist) gave verbal. Apparently in [place] gabapentin is a controlled substance and not accepted electronically

## 2017-12-19 ENCOUNTER — Ambulatory Visit (HOSPITAL_BASED_OUTPATIENT_CLINIC_OR_DEPARTMENT_OTHER)
Admission: RE | Admit: 2017-12-19 | Discharge: 2017-12-19 | Disposition: A | Discharge status: Home / Self Care | Payer: 59 | Source: Ambulatory Visit | Attending: Family Medicine | Admitting: Family Medicine

## 2017-12-19 DIAGNOSIS — E042 Nontoxic multinodular goiter: Secondary | ICD-10-CM | POA: Diagnosis not present

## 2017-12-20 ENCOUNTER — Encounter: Payer: Self-pay | Admitting: Family Medicine

## 2018-01-04 ENCOUNTER — Ambulatory Visit: Payer: 59 | Admitting: Neurology

## 2018-02-18 ENCOUNTER — Encounter: Payer: Self-pay | Admitting: Family Medicine

## 2018-02-18 ENCOUNTER — Ambulatory Visit (INDEPENDENT_AMBULATORY_CARE_PROVIDER_SITE_OTHER): Payer: 59 | Admitting: Family Medicine

## 2018-02-18 VITALS — BP 124/82 | HR 73 | Temp 98.1°F | Ht 66.0 in | Wt 151.2 lb

## 2018-02-18 DIAGNOSIS — E785 Hyperlipidemia, unspecified: Secondary | ICD-10-CM

## 2018-02-18 DIAGNOSIS — R002 Palpitations: Secondary | ICD-10-CM | POA: Diagnosis not present

## 2018-02-18 LAB — LIPID PANEL
Cholesterol: 196 mg/dL (ref 0–200)
HDL: 59.8 mg/dL (ref >39.00)
LDL Cholesterol: 111 mg/dL — ABNORMAL HIGH (ref 0–99)
NonHDL: 136.3
Total CHOL/HDL Ratio: 3
Triglycerides: 129 mg/dL (ref 0.0–149.0)
VLDL: 25.8 mg/dL (ref 0.0–40.0)

## 2018-02-18 NOTE — Patient Instructions (Signed)
We will check your cholesterol today- I'll let you know how it looks I suspect you are having benign early heart beats- PACs or PVCs.  We will get a holter monitor (heart monitor for 24- 48 hours) and an ultrasound to get more information  I will set these up for you- let me know if you don't hear about your arrangements in the next several days  If you have any chest pain or other concerns please alert me!

## 2018-02-18 NOTE — Progress Notes (Addendum)
[facility] at [facility] [address], [place], [place] [address] [phone]  Date:  02/18/2018   Name:  Curtis Luna   DOB:  1961/07/22   MRN:  [id number]  PCP:  Darreld Mclean, MD    Chief Complaint: Tachycardia (Pt states that his heart feels like it's racing or skipping a beat when lying down. Pt states that he noticed this on Sunday and Monday. )   History of Present Illness:  Curtis Luna is a 57 y.o. very pleasant male patient who presents with the following:  Seen for a CPE back in December for a CPE He is here today with concern of palpitations that will occur on occasion for the last 2 years.  Over this past weekend he was laying on the couch and noted the feeling more frequently than usual for a couple of hours.    He feels like the palp last for a few seconds- less than 5 seconds generally  He will notice the sx when he is sitting quietly at rest.   No chest pain No syncope  He has not done a whole lot of exercise recently- however no exertional sx or change in his exercise tolerance   His brother recently had a mitral valve operation which concerns [name]  Former smoker   Recent TSH normal in December  He came off his statin due to possible SE.  Would like to check his lipids today Off his statin now for about 5 months- we will see how it looks today, and perhaps we do NOT need to go back on the statin    Patient Active Problem List   Diagnosis Date Noted  . Multiple thyroid nodules 11/05/2015  . Peripheral neuropathy 04/18/2015  . Vertigo 04/15/2015  . FH: brain aneurysm 04/15/2015  . Visit for preventive health examination 10/17/2014  . 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 Seasonal affective disorder ([place]) 10/02/2014  . Internal hemorrhoid 04/20/2014  . Chronic low back pain 04/20/2014  . Anxiety 04/20/2014  . Esophageal reflux 04/04/2011  . Diverticulosis of colon (without mention of  hemorrhage) 04/04/2011    Past Medical History:  Diagnosis Date  . Abdominal pain, right lower quadrant   . Acute bronchospasm   . Backache, unspecified   . Chicken pox   . Diverticulosis of colon (without mention of hemorrhage)   . Esophageal reflux   . External hemorrhoids without mention of complication    thrombosed  . Hiatal hernia   . Inguinal hernia without mention of obstruction or gangrene, unilateral or unspecified, (not specified as recurrent)   . Migraine   . Obstructive sleep apnea (adult) (pediatric)   . Other symptoms involving digestive system(787.99)   . Pain in joint, forearm   . Pain in joint, shoulder region   . Seasonal allergies   . Thoracic or lumbosacral neuritis or radiculitis, unspecified     Past Surgical History:  Procedure Laterality Date  . COLON RESECTION  10/06   sigmoid colon  . INGUINAL HERNIA REPAIR  12/2012   Left  . WISDOM TOOTH EXTRACTION      Social History   Tobacco Use  . Smoking status: Former Smoker    Types: Cigarettes, Cigars    Last attempt to quit: 10/04/1987    Years since quitting: 30.3  . Smokeless tobacco: Never Used  Substance Use Topics  . Alcohol use: Yes    Alcohol/week: 4.2 -  8.4 oz    Types: 7 - 14 Standard drinks or equivalent per week    Comment: 2 daily   . Drug use: No    Family History  Problem Relation Age of Onset  . Other Father        MVA  . Lung cancer Mother 33       Deceased  . Diabetes Maternal Aunt   . Heart disease Paternal Grandmother   . Heart attack Paternal Grandmother   . Healthy Brother        x2  . Aneurysm Sister        Brain  . Allergies Daughter        x2  . Migraines Daughter        x1  . Colon cancer Neg Hx     Allergies  Allergen Reactions  . Escitalopram Hives    Pt felt anxious  Pt felt his skin was "on fire"  . Protonix [Pantoprazole Sodium] Other (See Comments)    Tingling of extremities    Medication list has been reviewed and updated.  Current  Outpatient Medications on File Prior to Visit  Medication Sig Dispense Refill  . celecoxib (CELEBREX) 200 MG capsule Take 1 capsule (200 mg total) by mouth daily. Use as needed for pain 90 capsule 3  . gabapentin (NEURONTIN) 100 MG capsule Take 1 capsule (100 mg total) by mouth 3 (three) times daily. 90 capsule 1  . LORazepam (ATIVAN) 1 MG tablet Take 1 mg by mouth as needed for anxiety.    . pravastatin (PRAVACHOL) 20 MG tablet TAKE 1 TABLET BY MOUTH  DAILY (Patient not taking: Reported on 02/18/2018) 90 tablet 3   No current facility-administered medications on file prior to visit.     Review of Systems:  As per HPI- otherwise negative.   Physical Examination: Vitals:   02/18/18 1334  BP: 124/82  Pulse: 73  Temp: 98.1 F (36.7 C)  SpO2: 97%   Vitals:   02/18/18 1334  Weight: 151 lb 3.2 oz (68.6 kg)  Height: 5\' 6"  (1.676 m)   Body mass index is 24.4 kg/m. Ideal Body Weight: Weight in (lb) to have BMI = 25: 154.6  GEN: WDWN, NAD, Non-toxic, A & O x 3, normal weight, looks well  HEENT: Atraumatic, Normocephalic. Neck supple. No masses, No LAD.  Bilateral TM wnl, oropharynx normal.  PEERL,EOMI.   Ears and Nose: No external deformity. CV: RRR, No M/G/R. No JVD. No thrill. No extra heart sounds. PULM: CTA B, no wheezes, crackles, rhonchi. No retractions. No resp. distress. No accessory muscle use. ABD: S, NT, ND EXTR: No c/c/e NEURO Normal gait.  PSYCH: Normally interactive. Conversant. Not depressed or anxious appearing.  Calm demeanor.   EKG: NSR.  Compared with EKG from 2015- no significant change noted  Assessment and Plan: Palpitations - Plan: EKG 12-Lead, Holter monitor - 48 hour, ECHOCARDIOGRAM COMPLETE  Dyslipidemia - Plan: Lipid panel  Palpitations- nothing seen on EKG today Suspect a benign etiology, will arrange echo and holter for him Recent labs and TSH ok He will alert if any change or worsening Repeat lipids- he stopped his statin due to SE about 5 months  ago, will see how he looks off it today He is willing to try statins again if need be  Signed Lamar Blinks, MD  Received lipid panel- message to pt  Your cholesterol looks quite good- better than at last check.  You have raised your HDL 10 points and  dropped your LDL.   We will keep your off cholesterol meds, continue to check routinely  Results for orders placed or performed in visit on 02/18/18  Lipid panel  Result Value Ref Range   Cholesterol 196 0 - 200 mg/dL   Triglycerides 129.0 0.0 - 149.0 mg/dL   HDL 59.80 >39.00 mg/dL   VLDL 25.8 0.0 - 40.0 mg/dL   LDL Cholesterol 111 (H) 0 - 99 mg/dL   Total CHOL/HDL Ratio 3    NonHDL 136.30

## 2018-03-09 ENCOUNTER — Ambulatory Visit (INDEPENDENT_AMBULATORY_CARE_PROVIDER_SITE_OTHER): Payer: 59

## 2018-03-09 ENCOUNTER — Other Ambulatory Visit: Payer: Self-pay

## 2018-03-09 ENCOUNTER — Ambulatory Visit (HOSPITAL_COMMUNITY): Payer: 59 | Attending: Cardiology

## 2018-03-09 DIAGNOSIS — R002 Palpitations: Secondary | ICD-10-CM | POA: Diagnosis not present

## 2018-03-12 ENCOUNTER — Encounter: Payer: Self-pay | Admitting: Family Medicine

## 2018-03-16 ENCOUNTER — Ambulatory Visit: Payer: 59 | Admitting: Neurology

## 2018-03-18 ENCOUNTER — Encounter: Payer: Self-pay | Admitting: Family Medicine

## 2018-04-06 ENCOUNTER — Encounter: Payer: Self-pay | Admitting: Family Medicine

## 2018-04-06 DIAGNOSIS — R42 Dizziness and giddiness: Secondary | ICD-10-CM

## 2018-04-12 DIAGNOSIS — R42 Dizziness and giddiness: Secondary | ICD-10-CM | POA: Diagnosis not present

## 2018-04-12 DIAGNOSIS — M530 Cervicocranial syndrome: Secondary | ICD-10-CM | POA: Diagnosis not present

## 2018-04-12 DIAGNOSIS — H81392 Other peripheral vertigo, left ear: Secondary | ICD-10-CM | POA: Diagnosis not present

## 2018-04-16 ENCOUNTER — Telehealth: Payer: Self-pay | Admitting: *Deleted

## 2018-04-16 DIAGNOSIS — M530 Cervicocranial syndrome: Secondary | ICD-10-CM | POA: Diagnosis not present

## 2018-04-16 DIAGNOSIS — R42 Dizziness and giddiness: Secondary | ICD-10-CM | POA: Diagnosis not present

## 2018-04-16 DIAGNOSIS — H81392 Other peripheral vertigo, left ear: Secondary | ICD-10-CM | POA: Diagnosis not present

## 2018-04-16 NOTE — Telephone Encounter (Signed)
Received Physician OrdersPT Plan of Care from [facility]; forwarded to provider/SLS 05/03

## 2018-04-19 DIAGNOSIS — H81392 Other peripheral vertigo, left ear: Secondary | ICD-10-CM | POA: Diagnosis not present

## 2018-04-19 DIAGNOSIS — R42 Dizziness and giddiness: Secondary | ICD-10-CM | POA: Diagnosis not present

## 2018-04-19 DIAGNOSIS — M530 Cervicocranial syndrome: Secondary | ICD-10-CM | POA: Diagnosis not present

## 2018-04-26 DIAGNOSIS — H81392 Other peripheral vertigo, left ear: Secondary | ICD-10-CM | POA: Diagnosis not present

## 2018-04-26 DIAGNOSIS — R42 Dizziness and giddiness: Secondary | ICD-10-CM | POA: Diagnosis not present

## 2018-04-26 DIAGNOSIS — M530 Cervicocranial syndrome: Secondary | ICD-10-CM | POA: Diagnosis not present

## 2018-05-03 DIAGNOSIS — R42 Dizziness and giddiness: Secondary | ICD-10-CM | POA: Diagnosis not present

## 2018-05-03 DIAGNOSIS — M530 Cervicocranial syndrome: Secondary | ICD-10-CM | POA: Diagnosis not present

## 2018-05-03 DIAGNOSIS — H81392 Other peripheral vertigo, left ear: Secondary | ICD-10-CM | POA: Diagnosis not present

## 2018-05-06 DIAGNOSIS — H81392 Other peripheral vertigo, left ear: Secondary | ICD-10-CM | POA: Diagnosis not present

## 2018-05-06 DIAGNOSIS — M530 Cervicocranial syndrome: Secondary | ICD-10-CM | POA: Diagnosis not present

## 2018-05-06 DIAGNOSIS — R42 Dizziness and giddiness: Secondary | ICD-10-CM | POA: Diagnosis not present

## 2018-05-14 DIAGNOSIS — H81392 Other peripheral vertigo, left ear: Secondary | ICD-10-CM | POA: Diagnosis not present

## 2018-05-14 DIAGNOSIS — M530 Cervicocranial syndrome: Secondary | ICD-10-CM | POA: Diagnosis not present

## 2018-05-14 DIAGNOSIS — R42 Dizziness and giddiness: Secondary | ICD-10-CM | POA: Diagnosis not present

## 2018-06-09 ENCOUNTER — Encounter: Payer: Self-pay | Admitting: Family Medicine

## 2018-06-09 DIAGNOSIS — G5712 Meralgia paresthetica, left lower limb: Secondary | ICD-10-CM

## 2018-06-10 NOTE — Telephone Encounter (Signed)
I have pended referral to neurology. Please advise.

## 2018-06-25 ENCOUNTER — Encounter: Payer: Self-pay | Admitting: Family Medicine

## 2018-06-25 ENCOUNTER — Ambulatory Visit (INDEPENDENT_AMBULATORY_CARE_PROVIDER_SITE_OTHER): Payer: 59 | Admitting: Family Medicine

## 2018-06-25 DIAGNOSIS — M79672 Pain in left foot: Secondary | ICD-10-CM | POA: Diagnosis not present

## 2018-06-25 NOTE — Patient Instructions (Signed)
You have plantar fasciitis Plantar fascia stretch for 20-30 seconds (do 3 of these) in morning Lowering/raise on a step exercises 3 x 10 once or twice a day - this is very important for long term recovery. Can add heel walks, toe walks forward and backward as well Ice heel for 15 minutes as needed. Avoid flat shoes/barefoot walking as much as possible. Arch straps have been shown to help with pain. Inserts are important (dr. Zoe Lan active series, spencos, our green insoles, custom orthotics). Tylenol and/or aleve as needed for pain  Steroid injection is a consideration for short term pain relief if you are struggling. Physical therapy is also an option. Consider night splints as well Follow up with me in 6 weeks.

## 2018-06-27 ENCOUNTER — Encounter: Payer: Self-pay | Admitting: Family Medicine

## 2018-06-27 DIAGNOSIS — M79672 Pain in left foot: Secondary | ICD-10-CM | POA: Insufficient documentation

## 2018-06-27 NOTE — Assessment & Plan Note (Signed)
2/2 plantar fasciitis.  Shown home exercises and stretches to do daily.  Icing.  Tylenol or aleve if needed.  Arch binders.  Stressed importance of arch supports.  Consider PT, night splints, custom orthotics.  F/u in 6 weeks.

## 2018-06-27 NOTE — Progress Notes (Signed)
PCP: Copland, Gay Filler, MD  Subjective:   HPI: Patient is a 57 y.o. male here for left heel pain.  Patient reports for the past couple weeks he's had left heel pain. Has been walking more. Just got new shoes. Pain worse and up to 8/10, sharp first thing in the morning; also worse with prolonged sitting. Pain currently 2/10. History of plantar fasciitis 15 years ago. No skin changes, numbness.  Past Medical History:  Diagnosis Date  . Abdominal pain, right lower quadrant   . Acute bronchospasm   . Backache, unspecified   . Chicken pox   . Diverticulosis of colon (without mention of hemorrhage)   . Esophageal reflux   . External hemorrhoids without mention of complication    thrombosed  . Hiatal hernia   . Inguinal hernia without mention of obstruction or gangrene, unilateral or unspecified, (not specified as recurrent)   . Migraine   . Obstructive sleep apnea (adult) (pediatric)   . Other symptoms involving digestive system(787.99)   . Pain in joint, forearm   . Pain in joint, shoulder region   . Seasonal allergies   . Thoracic or lumbosacral neuritis or radiculitis, unspecified     Current Outpatient Medications on File Prior to Visit  Medication Sig Dispense Refill  . celecoxib (CELEBREX) 200 MG capsule Take 1 capsule (200 mg total) by mouth daily. Use as needed for pain 90 capsule 3  . gabapentin (NEURONTIN) 100 MG capsule Take 1 capsule (100 mg total) by mouth 3 (three) times daily. 90 capsule 1  . LORazepam (ATIVAN) 1 MG tablet Take 1 mg by mouth as needed for anxiety.    . pravastatin (PRAVACHOL) 20 MG tablet TAKE 1 TABLET BY MOUTH  DAILY (Patient not taking: Reported on 02/18/2018) 90 tablet 3   No current facility-administered medications on file prior to visit.     Past Surgical History:  Procedure Laterality Date  . COLON RESECTION  10/06   sigmoid colon  . INGUINAL HERNIA REPAIR  12/2012   Left  . WISDOM TOOTH EXTRACTION      Allergies  Allergen  Reactions  . Escitalopram Hives    Pt felt anxious  Pt felt his skin was "on fire"  . Protonix [Pantoprazole Sodium] Other (See Comments)    Tingling of extremities    Social History   Socioeconomic History  . Marital status: Married    Spouse name: Not on file  . Number of children: Not on file  . Years of education: Not on file  . Highest education level: Not on file  Occupational History  . Occupation: Development worker, international aid  . Financial resource strain: Not on file  . Food insecurity:    Worry: Not on file    Inability: Not on file  . Transportation needs:    Medical: Not on file    Non-medical: Not on file  Tobacco Use  . Smoking status: Former Smoker    Types: Cigarettes, Cigars    Last attempt to quit: 10/04/1987    Years since quitting: 30.7  . Smokeless tobacco: Never Used  Substance and Sexual Activity  . Alcohol use: Yes    Alcohol/week: 4.2 - 8.4 oz    Types: 7 - 14 Standard drinks or equivalent per week    Comment: 2 daily   . Drug use: No  . Sexual activity: Not on file  Lifestyle  . Physical activity:    Days per week: Not on file  Minutes per session: Not on file  . Stress: Not on file  Relationships  . Social connections:    Talks on phone: Not on file    Gets together: Not on file    Attends religious service: Not on file    Active member of club or organization: Not on file    Attends meetings of clubs or organizations: Not on file    Relationship status: Not on file  . Intimate partner violence:    Fear of current or ex partner: Not on file    Emotionally abused: Not on file    Physically abused: Not on file    Forced sexual activity: Not on file  Other Topics Concern  . Not on file  Social History Narrative   1 caffeine drink daily  Lives with wife in a one story home.  Has 2 children.  Works in Estate manager/land agent.    Family History  Problem Relation Age of Onset  . Other Father        MVA  . Lung cancer Mother 47        Deceased  . Diabetes Maternal Aunt   . Heart disease Paternal Grandmother   . Heart attack Paternal Grandmother   . Healthy Brother        x2  . Aneurysm Sister        Brain  . Allergies Daughter        x2  . Migraines Daughter        x1  . Colon cancer Neg Hx     BP 121/86   Pulse 67   Ht 5\' 5"  (1.651 m)   Wt 150 lb (68 kg)   BMI 24.96 kg/m   Review of Systems: See HPI above.     Objective:  Physical Exam:  Gen: NAD, comfortable in exam room  Left foot/ankle: No gross deformity, swelling, ecchymoses FROM with 5/5 strength. TTP medial and lateral calcaneus at proximal plantar fascia insertion.  Negative calcaneal squeeze Negative ant drawer and talar tilt.   Negative syndesmotic compression. Thompsons test negative. NV intact distally.   Right foot/ankle: No deformity. FROM with 5/5 strength. No tenderness to palpation. NVI distally.  Assessment & Plan:  1. Left foot pain - 2/2 plantar fasciitis.  Shown home exercises and stretches to do daily.  Icing.  Tylenol or aleve if needed.  Arch binders.  Stressed importance of arch supports.  Consider PT, night splints, custom orthotics.  F/u in 6 weeks.

## 2018-07-03 ENCOUNTER — Encounter: Payer: Self-pay | Admitting: Family Medicine

## 2018-07-03 DIAGNOSIS — F419 Anxiety disorder, unspecified: Secondary | ICD-10-CM

## 2018-07-03 DIAGNOSIS — G5712 Meralgia paresthetica, left lower limb: Secondary | ICD-10-CM

## 2018-07-06 MED ORDER — GABAPENTIN 100 MG PO CAPS: 100.0000 mg | ORAL_CAPSULE | Freq: Three times a day (TID) | ORAL | 1 refills | Status: DC

## 2018-07-06 MED ORDER — LORAZEPAM 1 MG PO TABS: 1.0000 mg | ORAL_TABLET | ORAL | 0 refills | Status: DC | PRN

## 2018-07-06 MED FILL — LORazepam 1 MG TABS: 1 | 30 days supply | Qty: 30 | Fill #0

## 2018-07-07 ENCOUNTER — Telehealth: Payer: Self-pay | Admitting: *Deleted

## 2018-07-07 MED ORDER — GABAPENTIN 100 MG PO CAPS: 100.0000 mg | ORAL_CAPSULE | Freq: Three times a day (TID) | ORAL | 1 refills | Status: DC

## 2018-07-07 NOTE — Addendum Note (Signed)
Addended by: Lamar Blinks C on: 07/07/2018 08:43 AM   Modules accepted: Orders

## 2018-07-07 NOTE — Telephone Encounter (Signed)
Received End of Care plan for review; forwarded to provider/SLS 07/24

## 2018-08-23 ENCOUNTER — Ambulatory Visit (INDEPENDENT_AMBULATORY_CARE_PROVIDER_SITE_OTHER): Payer: 59 | Admitting: Family Medicine

## 2018-08-23 ENCOUNTER — Encounter: Payer: Self-pay | Admitting: Family Medicine

## 2018-08-23 ENCOUNTER — Ambulatory Visit: Payer: 59 | Admitting: Family Medicine

## 2018-08-23 VITALS — BP 110/82 | HR 82 | Temp 97.7°F | Ht 66.0 in | Wt 153.4 lb

## 2018-08-23 DIAGNOSIS — M79674 Pain in right toe(s): Secondary | ICD-10-CM

## 2018-08-23 MED ORDER — DOXYCYCLINE HYCLATE 100 MG PO TABS: 100.0000 mg | ORAL_TABLET | Freq: Two times a day (BID) | ORAL | 0 refills | Status: DC

## 2018-08-23 MED FILL — DOXYCYCLINE HYCLATE 100 MG: 100 | 10 days supply | Qty: 20 | Fill #0

## 2018-08-23 NOTE — Assessment & Plan Note (Signed)
No signs of puncture wound/bite.  I&D completed today Rx for doxycycline He will use celebrex for pain control.  Differential includes gout tophi as well however no history and no joint swelling or tenderness.

## 2018-08-23 NOTE — Progress Notes (Signed)
Curtis Luna - 57 y.o. male MRN [id number]  Date of birth: Apr 10, 1961  Subjective Chief Complaint  Patient presents with  . Insect Bite    spider    HPI Curtis Luna is a 57 y.o. male here today with concern of spider bite to pinky toe of R foot.  Reports he was playing golf over the weekend and at the end of the day noticed what he initially thought was a blister between the toe.  This became a little more swollen and painful with surrounding redness.  He denies any warmth or joint swelling.  He has not had any fever.  He denies a history of gout.    ROS:  A comprehensive ROS was completed and negative except as noted per HPI  Allergies  Allergen Reactions  . Escitalopram Hives    Pt felt anxious  Pt felt his skin was "on fire"  . Protonix [Pantoprazole Sodium] Other (See Comments)    Tingling of extremities    Past Medical History:  Diagnosis Date  . Abdominal pain, right lower quadrant   . Acute bronchospasm   . Backache, unspecified   . Chicken pox   . Diverticulosis of colon (without mention of hemorrhage)   . Esophageal reflux   . External hemorrhoids without mention of complication    thrombosed  . Hiatal hernia   . Inguinal hernia without mention of obstruction or gangrene, unilateral or unspecified, (not specified as recurrent)   . Migraine   . Obstructive sleep apnea (adult) (pediatric)   . Other symptoms involving digestive system(787.99)   . Pain in joint, forearm   . Pain in joint, shoulder region   . Seasonal allergies   . Thoracic or lumbosacral neuritis or radiculitis, unspecified     Past Surgical History:  Procedure Laterality Date  . COLON RESECTION  10/06   sigmoid colon  . INGUINAL HERNIA REPAIR  12/2012   Left  . WISDOM TOOTH EXTRACTION      Social History   Socioeconomic History  . Marital status: Married    Spouse name: Not on file  . Number of children: Not on file  . Years of education: Not on file  . Highest education level: Not  on file  Occupational History  . Occupation: Development worker, international aid  . Financial resource strain: Not on file  . Food insecurity:    Worry: Not on file    Inability: Not on file  . Transportation needs:    Medical: Not on file    Non-medical: Not on file  Tobacco Use  . Smoking status: Former Smoker    Types: Cigarettes, Cigars    Last attempt to quit: 10/04/1987    Years since quitting: 30.9  . Smokeless tobacco: Never Used  Substance and Sexual Activity  . Alcohol use: Yes    Alcohol/week: 7.0 - 14.0 standard drinks    Types: 7 - 14 Standard drinks or equivalent per week    Comment: 2 daily   . Drug use: No  . Sexual activity: Not on file  Lifestyle  . Physical activity:    Days per week: Not on file    Minutes per session: Not on file  . Stress: Not on file  Relationships  . Social connections:    Talks on phone: Not on file    Gets together: Not on file    Attends religious service: Not on file    Active member of club or organization:  Not on file    Attends meetings of clubs or organizations: Not on file    Relationship status: Not on file  Other Topics Concern  . Not on file  Social History Narrative   1 caffeine drink daily  Lives with wife in a one story home.  Has 2 children.  Works in Estate manager/land agent.    Family History  Problem Relation Age of Onset  . Other Father        MVA  . Lung cancer Mother 21       Deceased  . Diabetes Maternal Aunt   . Heart disease Paternal Grandmother   . Heart attack Paternal Grandmother   . Healthy Brother        x2  . Aneurysm Sister        Brain  . Allergies Daughter        x2  . Migraines Daughter        x1  . Colon cancer Neg Hx     Health Maintenance  Topic Date Due  . INFLUENZA VACCINE  07/15/2018  . TETANUS/TDAP  04/05/2020  . COLONOSCOPY  04/03/2021  . Hepatitis C Screening  Completed  . HIV Screening  Completed     ----------------------------------------------------------------------------------------------------------------------------------------------------------------------------------------------------------------- Physical Exam BP 110/82 (BP Location: Left Arm, Patient Position: Sitting, Cuff Size: Normal)   Pulse 82   Temp 97.7 F (36.5 C) (Oral)   Ht 5\' 6"  (1.676 m)   Wt 153 lb 6.4 oz (69.6 kg)   SpO2 96%   BMI 24.76 kg/m   Physical Exam  Constitutional: He appears well-nourished. No distress.  Cardiovascular: Normal rate, regular rhythm and normal heart sounds.  Pulmonary/Chest: Effort normal and breath sounds normal.  Neurological: He is alert.  Skin:  Indurated area with fluctuance and purulent appearing material on medial portion of R 5th toe.    Psychiatric: He has a normal mood and affect. His behavior is normal.   Procedure note:  Area prepped with Betadine and alcohol wipes.  ~56ml of 2% lidocaine was used locally for anesthesia.  An puncture I&D was completed using an 18 gauge needle.  Some thick, purulent material was expressed.  He tolerated this well.    ------------------------------------------------------------------------------------------------------------------------------------------------------------------------------------------------------------------- Assessment and Plan  Toe pain, right No signs of puncture wound/bite.  I&D completed today Rx for doxycycline He will use celebrex for pain control.  Differential includes gout tophi as well however no history and no joint swelling or tenderness.

## 2018-08-23 NOTE — Patient Instructions (Signed)
You may use celebrex for pain control Start doxycycline You should see improvement over the next few days.

## 2018-08-30 ENCOUNTER — Ambulatory Visit (INDEPENDENT_AMBULATORY_CARE_PROVIDER_SITE_OTHER): Payer: 59 | Admitting: Family Medicine

## 2018-08-30 ENCOUNTER — Encounter: Payer: Self-pay | Admitting: Family Medicine

## 2018-08-30 VITALS — BP 116/78 | HR 60 | Ht 65.0 in | Wt 155.0 lb

## 2018-08-30 DIAGNOSIS — M25522 Pain in left elbow: Secondary | ICD-10-CM | POA: Diagnosis not present

## 2018-08-30 NOTE — Progress Notes (Signed)
PCP: Copland, Gay Filler, MD  Subjective:   HPI: Patient is a 57 y.o. male here for left elbow pain for 2 to 3 months.  Patient reports 4/10 pain today.  His pain is worsened by golfing and can be as bad as 8/10 and sharp.  He localizes his pain to 2 locations both medially over the medial epicondyles as well as over the antecubital fossa.  He is unable to provoke the medial pain but notes that the anterior pain is worse with full extension of the elbow.  He denies any swelling, erythema, or bruising.  No recent injury to the elbow however as a child he reports having had fractured his elbow with resulting varus deformity.  He has been using Celebrex as needed for pain which is somewhat helpful.  He denies any numbness or tingling in the upper extremity.  He denies any feeling of weakness. No skin changes.  Past Medical History:  Diagnosis Date  . Abdominal pain, right lower quadrant   . Acute bronchospasm   . Backache, unspecified   . Chicken pox   . Diverticulosis of colon (without mention of hemorrhage)   . Esophageal reflux   . External hemorrhoids without mention of complication    thrombosed  . Hiatal hernia   . Inguinal hernia without mention of obstruction or gangrene, unilateral or unspecified, (not specified as recurrent)   . Migraine   . Obstructive sleep apnea (adult) (pediatric)   . Other symptoms involving digestive system(787.99)   . Pain in joint, forearm   . Pain in joint, shoulder region   . Seasonal allergies   . Thoracic or lumbosacral neuritis or radiculitis, unspecified     Current Outpatient Medications on File Prior to Visit  Medication Sig Dispense Refill  . celecoxib (CELEBREX) 200 MG capsule Take 1 capsule (200 mg total) by mouth daily. Use as needed for pain 90 capsule 3  . doxycycline (VIBRA-TABS) 100 MG tablet Take 1 tablet (100 mg total) by mouth 2 (two) times daily. 20 tablet 0  . gabapentin (NEURONTIN) 100 MG capsule Take 1 capsule (100 mg total) by  mouth 3 (three) times daily. 270 capsule 1  . LORazepam (ATIVAN) 1 MG tablet Take 1 tablet (1 mg total) by mouth as needed for anxiety. May take 1 per day 30 tablet 0  . pravastatin (PRAVACHOL) 20 MG tablet TAKE 1 TABLET BY MOUTH  DAILY (Patient not taking: Reported on 08/23/2018) 90 tablet 3   No current facility-administered medications on file prior to visit.     Past Surgical History:  Procedure Laterality Date  . COLON RESECTION  10/06   sigmoid colon  . INGUINAL HERNIA REPAIR  12/2012   Left  . WISDOM TOOTH EXTRACTION      Allergies  Allergen Reactions  . Escitalopram Hives    Pt felt anxious  Pt felt his skin was "on fire"  . Protonix [Pantoprazole Sodium] Other (See Comments)    Tingling of extremities    Social History   Socioeconomic History  . Marital status: Married    Spouse name: Not on file  . Number of children: Not on file  . Years of education: Not on file  . Highest education level: Not on file  Occupational History  . Occupation: Development worker, international aid  . Financial resource strain: Not on file  . Food insecurity:    Worry: Not on file    Inability: Not on file  . Transportation needs:  Medical: Not on file    Non-medical: Not on file  Tobacco Use  . Smoking status: Former Smoker    Types: Cigarettes, Cigars    Last attempt to quit: 10/04/1987    Years since quitting: 30.9  . Smokeless tobacco: Never Used  Substance and Sexual Activity  . Alcohol use: Yes    Alcohol/week: 7.0 - 14.0 standard drinks    Types: 7 - 14 Standard drinks or equivalent per week    Comment: 2 daily   . Drug use: No  . Sexual activity: Not on file  Lifestyle  . Physical activity:    Days per week: Not on file    Minutes per session: Not on file  . Stress: Not on file  Relationships  . Social connections:    Talks on phone: Not on file    Gets together: Not on file    Attends religious service: Not on file    Active member of club or organization:  Not on file    Attends meetings of clubs or organizations: Not on file    Relationship status: Not on file  . Intimate partner violence:    Fear of current or ex partner: Not on file    Emotionally abused: Not on file    Physically abused: Not on file    Forced sexual activity: Not on file  Other Topics Concern  . Not on file  Social History Narrative   1 caffeine drink daily  Lives with wife in a one story home.  Has 2 children.  Works in Estate manager/land agent.    Family History  Problem Relation Age of Onset  . Other Father        MVA  . Lung cancer Mother 25       Deceased  . Diabetes Maternal Aunt   . Heart disease Paternal Grandmother   . Heart attack Paternal Grandmother   . Healthy Brother        x2  . Aneurysm Sister        Brain  . Allergies Daughter        x2  . Migraines Daughter        x1  . Colon cancer Neg Hx     BP 116/78   Pulse 60   Ht 5\' 5"  (1.651 m)   Wt 155 lb (70.3 kg)   BMI 25.79 kg/m   Review of Systems: See HPI above.     Objective:  Physical Exam:  Gen: awake, alert, NAD, comfortable in exam room Pulm: breathing unlabored  MSK: Left elbow Inspection: Patient has a varus deformity at the elbow with full extension which is his baseline.  No swelling, erythema, bruising.  No skin changes Palpation: Mild tenderness palpation over the medial > lateral epicondyles.  No tenderness over the distal biceps tendon.  No bony tenderness ROM: Full range of motion of the elbow and wrist Strength: 5/5 strength at the elbow and wrist.  Mild pain with resisted supination. Neurovascular: N/V intact Special tests: No instability of the elbow; collateral ligaments intact.  Right Elbow: Inspection: No deformity or swelling Palpation: No tenderness to palpation ROM: Full range of motion Strength: 5/5 strength Neurovascular: N/V intact   Assessment & Plan:  1.  Left elbow pain- patient has symptoms consistent with mild medial epicondylitis as well  as a less specific anterior pain.  It is not clear what the underlying etiology of this pain is but seems to be consistent with overuse injury.  There is varying degree of involvement with supination. - Patient provided with home exercises for medial epicondylitis as well as distal biceps tendinitis - Recommended counterforce brace or elbow sleeve for relief -Continue NSAIDs for pain relief -Ice 15 minutes 3-4 times daily -We will have him follow-up in 6 weeks at which time we will consider physical therapy if he is not improving.

## 2018-08-30 NOTE — Patient Instructions (Signed)
You have medial epicondylitis (golfer's elbow) and mild supinator tendinopathy/strain Avoid painful activities as much as possible (unless doing home exercises). Take celebrex 24 hours before golfing and take it through a day following golf. Icing 3-4 times a day - careful around ulnar nerve on inside of elbow. Strengthening with 1 pound weight pronation/supination, wrist flexion, bicep curls, stretching exercises (hold stretches 20-30 seconds and repeat 3 times, exercises 3 sets of 10 of each). Sleeve or counterforce brace may be helpful to unload area of pain while it heals - definitely with golf, as needed other times. Consider formal physical therapy, nitro patches if not improving with home exercises. Follow up with me in 6 weeks.

## 2018-11-14 NOTE — Progress Notes (Addendum)
[facility] [address], [place], [place] [address] [phone]  Date:  11/18/2018   Name:  Curtis Luna   DOB:  [birth year]   MRN:  [id number]  PCP:  Darreld Mclean, MD    Chief Complaint: No chief complaint on file.   History of Present Illness:  Curtis Luna is a 57 y.o. very pleasant male patient who presents with the following:  Here today for a CPE- last done about one year ago History of low back pain, SAD, anxiety, ED  He smoked for 10 years, 1 PPD - quit years ago. Does not qualify for lung cancer screening CT  Former smoker but too young for AAA screening I saw him in March for palpitations- both echo and holter looked ok so we thought this was a benign process He has palp now and again now, more so if he eats too much sugar  No CP or SOB He does exercise- he walks a lot.  No CP with exertion  Labs: due for full panel except lipids done in March.  He did have some oatmeal this am  Colon: 2012- per pt 10 year follow-up Immun: Flu; he notes that this tends to make him feel ill. Declines today but may do next year  Shingrix?  Discussed with him.    He is thinking about this   Today he notes that he was taking zantac but stopped when it got pulled off the market He went back on nexium instead which does help He was not sure if ok to take daily  He using some CBD oil for arthritis and joint pains.  He does feel like this is helping him   We did some PT for his vertigo; this really helped him. They thought it was something to do with his neck   In any case this got better  He did see Dr. Henrene Pastor for some GI discomfort about a year ago; he does continue to have abd discomfort only when he tries to sleep on his left side     He does not need any lorazepam at this time; he takes it rarely  He is good on celebrex, takes during golf season only He does take gabapentin for meralgia paraesthetica dx back in  2017 He is taking 100 TID currently.  He wonders if he can try and taper off this   BP Readings from Last 3 Encounters:  11/18/18 131/81  08/30/18 116/78  08/23/18 110/82   Pulse Readings from Last 3 Encounters:  08/30/18 60  08/23/18 82  06/25/18 67    Patient Active Problem List   Diagnosis Date Noted  . Toe pain, right 08/23/2018  . Pain of left heel 06/27/2018  . Multiple thyroid nodules 11/05/2015  . Peripheral neuropathy 04/18/2015  . Vertigo 04/15/2015  . FH: brain aneurysm 04/15/2015  . 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 Seasonal affective disorder ([place]) 10/02/2014  . Internal hemorrhoid 04/20/2014  . Chronic low back pain 04/20/2014  . Anxiety 04/20/2014  . Esophageal reflux 04/04/2011  . Diverticulosis of colon (without mention of hemorrhage) 04/04/2011    Past Medical History:  Diagnosis Date  . Abdominal pain, right lower quadrant   . Acute bronchospasm   . Backache, unspecified   . Chicken pox   . Diverticulosis of colon (without mention of hemorrhage)   . Esophageal reflux   . External hemorrhoids without mention  of complication    thrombosed  . Hiatal hernia   . Inguinal hernia without mention of obstruction or gangrene, unilateral or unspecified, (not specified as recurrent)   . Migraine   . Obstructive sleep apnea (adult) (pediatric)   . Other symptoms involving digestive system(787.99)   . Pain in joint, forearm   . Pain in joint, shoulder region   . Seasonal allergies   . Thoracic or lumbosacral neuritis or radiculitis, unspecified     Past Surgical History:  Procedure Laterality Date  . COLON RESECTION  10/06   sigmoid colon  . INGUINAL HERNIA REPAIR  12/2012   Left  . WISDOM TOOTH EXTRACTION      Social History   Tobacco Use  . Smoking status: Former Smoker    Types: Cigarettes, Cigars    Last attempt to quit: 10/04/1987    Years since quitting: 31.1  . Smokeless tobacco: Never Used  Substance Use Topics  .  Alcohol use: Yes    Alcohol/week: 7.0 - 14.0 standard drinks    Types: 7 - 14 Standard drinks or equivalent per week    Comment: 2 daily   . Drug use: No    Family History  Problem Relation Age of Onset  . Other Father        MVA  . Lung cancer Mother 61       Deceased  . Diabetes Maternal Aunt   . Heart disease Paternal Grandmother   . Heart attack Paternal Grandmother   . Healthy Brother        x2  . Aneurysm Sister        Brain  . Allergies Daughter        x2  . Migraines Daughter        x1  . Colon cancer Neg Hx     Allergies  Allergen Reactions  . Escitalopram Hives    Pt felt anxious  Pt felt his skin was "on fire"  . Protonix [Pantoprazole Sodium] Other (See Comments)    Tingling of extremities    Medication list has been reviewed and updated.  Current Outpatient Medications on File Prior to Visit  Medication Sig Dispense Refill  . celecoxib (CELEBREX) 200 MG capsule Take 1 capsule (200 mg total) by mouth daily. Use as needed for pain 90 capsule 3  . gabapentin (NEURONTIN) 100 MG capsule Take 1 capsule (100 mg total) by mouth 3 (three) times daily. 270 capsule 1  . LORazepam (ATIVAN) 1 MG tablet Take 1 tablet (1 mg total) by mouth as needed for anxiety. May take 1 per day 30 tablet 0   No current facility-administered medications on file prior to visit.     Review of Systems:  As per HPI- otherwise negative.   Physical Examination: Vitals:   11/18/18 1028  BP: 131/81  Resp: 16  Temp: 98.4 F (36.9 C)  SpO2: 100%   Vitals:   11/18/18 1028  Weight: 153 lb (69.4 kg)  Height: 5\' 5"  (1.651 m)   Body mass index is 25.46 kg/m. Ideal Body Weight: Weight in (lb) to have BMI = 25: 149.9  GEN: WDWN, NAD, Non-toxic, A & O x 3, looks well, normal weight   HEENT: Atraumatic, Normocephalic. Neck supple. No masses, No LAD.  Bilateral TM wnl, oropharynx normal.  PEERL,EOMI.   Ears and Nose: No external deformity. CV: RRR, No M/G/R. No JVD. No thrill. No  extra heart sounds. PULM: CTA B, no wheezes, crackles, rhonchi. No retractions. No  resp. distress. No accessory muscle use. ABD: S, NT, ND, +BS. No rebound. No HSM. EXTR: No c/c/e NEURO Normal gait.  PSYCH: Normally interactive. Conversant. Not depressed or anxious appearing.  Calm demeanor.    Assessment and Plan: Physical exam  Dyslipidemia  Screening for diabetes mellitus - Plan: Comprehensive metabolic panel, Hemoglobin A1c  Screening for prostate cancer - Plan: PSA  Screening for deficiency anemia - Plan: CBC  Meralgia paresthetica of left side - Plan: gabapentin (NEURONTIN) 100 MG capsule CPE today- he does have some minor joint pains and occasional vertigo Otherwise he is doing well and does not have any particular concerns today. We will follow-up with him pending his labs.  We will also fill out form and fax in his insurance company for him.  He wonders about tapering off his gabapentin.  I think that this is fine, suggested a slow taper for him.  He may try decreasing to twice a day for a month, then once a day for a month, then stop.  However if symptoms come back he may go back up on his dose again.  He has a form for his insurance to fill out today- will complete once labs are in Did not do lipids today as he had a panel in March and is not fasting   Signed Lamar Blinks, MD  Received his labs I have completed his form and given to my assistant to fax Results for orders placed or performed in visit on 11/18/18  CBC  Result Value Ref Range   WBC 7.1 4.0 - 10.5 K/uL   RBC 4.65 4.22 - 5.81 Mil/uL   Platelets 338.0 150.0 - 400.0 K/uL   Hemoglobin 15.0 13.0 - 17.0 g/dL   HCT 43.9 39.0 - 52.0 %   MCV 94.4 78.0 - 100.0 fl   MCHC 34.1 30.0 - 36.0 g/dL   RDW 14.0 11.5 - 15.5 %  Comprehensive metabolic panel  Result Value Ref Range   Sodium 141 135 - 145 mEq/L   Potassium 4.6 3.5 - 5.1 mEq/L   Chloride 102 96 - 112 mEq/L   CO2 31 19 - 32 mEq/L   Glucose, Bld 69  (L) 70 - 99 mg/dL   BUN 14 6 - 23 mg/dL   Creatinine, Ser 1.00 0.40 - 1.50 mg/dL   Total Bilirubin 0.4 0.2 - 1.2 mg/dL   Alkaline Phosphatase 49 39 - 117 U/L   AST 23 0 - 37 U/L   ALT 22 0 - 53 U/L   Total Protein 7.4 6.0 - 8.3 g/dL   Albumin 4.6 3.5 - 5.2 g/dL   Calcium 10.0 8.4 - 10.5 mg/dL   GFR 81.43 >60.00 mL/min  Hemoglobin A1c  Result Value Ref Range   Hgb A1c MFr Bld 5.7 4.6 - 6.5 %  PSA  Result Value Ref Range   PSA 1.32 0.10 - 4.00 ng/mL   Message to pt Lab Results  Component Value Date   PSA 1.32 11/18/2018   PSA 1.02 05/07/2017   PSA 0.79 11/13/2016

## 2018-11-18 ENCOUNTER — Ambulatory Visit (INDEPENDENT_AMBULATORY_CARE_PROVIDER_SITE_OTHER): Payer: 59 | Admitting: Family Medicine

## 2018-11-18 ENCOUNTER — Encounter: Payer: Self-pay | Admitting: Family Medicine

## 2018-11-18 VITALS — BP 131/81 | HR 75 | Temp 98.4°F | Resp 16 | Ht 65.0 in | Wt 153.0 lb

## 2018-11-18 DIAGNOSIS — Z130 Encounter for screening for diseases of the blood and blood-forming organs and certain disorders involving the immune mechanism: Secondary | ICD-10-CM

## 2018-11-18 DIAGNOSIS — G5712 Meralgia paresthetica, left lower limb: Secondary | ICD-10-CM

## 2018-11-18 DIAGNOSIS — E785 Hyperlipidemia, unspecified: Secondary | ICD-10-CM

## 2018-11-18 DIAGNOSIS — Z0000 Encounter for general adult medical examination without abnormal findings: Secondary | ICD-10-CM

## 2018-11-18 DIAGNOSIS — Z125 Encounter for screening for malignant neoplasm of prostate: Secondary | ICD-10-CM | POA: Diagnosis not present

## 2018-11-18 DIAGNOSIS — Z131 Encounter for screening for diabetes mellitus: Secondary | ICD-10-CM | POA: Diagnosis not present

## 2018-11-18 DIAGNOSIS — R972 Elevated prostate specific antigen [PSA]: Secondary | ICD-10-CM

## 2018-11-18 LAB — COMPREHENSIVE METABOLIC PANEL
ALT: 22 U/L (ref 0–53)
AST: 23 U/L (ref 0–37)
Albumin: 4.6 g/dL (ref 3.5–5.2)
Alkaline Phosphatase: 49 U/L (ref 39–117)
BUN: 14 mg/dL (ref 6–23)
CO2: 31 mEq/L (ref 19–32)
Calcium: 10 mg/dL (ref 8.4–10.5)
Chloride: 102 mEq/L (ref 96–112)
Creatinine, Ser: 1 mg/dL (ref 0.40–1.50)
GFR: 81.43 mL/min (ref >60.00)
Glucose, Bld: 69 mg/dL — ABNORMAL LOW (ref 70–99)
Potassium: 4.6 mEq/L (ref 3.5–5.1)
Sodium: 141 mEq/L (ref 135–145)
Total Bilirubin: 0.4 mg/dL (ref 0.2–1.2)
Total Protein: 7.4 g/dL (ref 6.0–8.3)

## 2018-11-18 LAB — CBC
HCT: 43.9 % (ref 39.0–52.0)
Hemoglobin: 15 g/dL (ref 13.0–17.0)
MCHC: 34.1 g/dL (ref 30.0–36.0)
MCV: 94.4 fl (ref 78.0–100.0)
Platelets: 338 K/uL (ref 150.0–400.0)
RBC: 4.65 Mil/uL (ref 4.22–5.81)
RDW: 14 % (ref 11.5–15.5)
WBC: 7.1 K/uL (ref 4.0–10.5)

## 2018-11-18 LAB — HEMOGLOBIN A1C: Hgb A1c MFr Bld: 5.7 % (ref 4.6–6.5)

## 2018-11-18 LAB — PSA: PSA: 1.32 ng/mL (ref 0.10–4.00)

## 2018-11-18 MED ORDER — GABAPENTIN 100 MG PO CAPS: 100.0000 mg | ORAL_CAPSULE | Freq: Three times a day (TID) | ORAL | 1 refills | Status: DC

## 2018-11-18 NOTE — Addendum Note (Signed)
Addended by: Lamar Blinks C on: 11/18/2018 09:57 PM   Modules accepted: Orders

## 2018-11-18 NOTE — Patient Instructions (Addendum)
Good to see you today- take care and I will be in touch with your labs  Let us know if you want to have the shingles vaccine- it is a 2 shot series Let Dr Henrene Pastor know if you continue to have any GI concerns; he might want to do a colon a bit early for you   Health Maintenance, Male A healthy lifestyle and preventive care is important for your health and wellness. Ask your health care provider about what schedule of regular examinations is right for you. What should I know about weight and diet? Eat a Healthy Diet  Eat plenty of vegetables, fruits, whole grains, low-fat dairy products, and lean protein.  Do not eat a lot of foods high in solid fats, added sugars, or salt.  Maintain a Healthy Weight Regular exercise can help you achieve or maintain a healthy weight. You should:  Do at least 150 minutes of exercise each week. The exercise should increase your heart rate and make you sweat (moderate-intensity exercise).  Do strength-training exercises at least twice a week.  Watch Your Levels of Cholesterol and Blood Lipids  Have your blood tested for lipids and cholesterol every 5 years starting at 59 years of age. If you are at high risk for heart disease, you should start having your blood tested when you are 58 years old. You may need to have your cholesterol levels checked more often if: ? Your lipid or cholesterol levels are high. ? You are older than 58 years of age. ? You are at high risk for heart disease.  What should I know about cancer screening? Many types of cancers can be detected early and may often be prevented. Lung Cancer  You should be screened every year for lung cancer if: ? You are a current smoker who has smoked for at least 30 years. ? You are a former smoker who has quit within the past 15 years.  Talk to your health care provider about your screening options, when you should start screening, and how often you should be screened.  Colorectal  Cancer  Routine colorectal cancer screening usually begins at 58 years of age and should be repeated every 5-10 years until you are 58 years old. You may need to be screened more often if early forms of precancerous polyps or small growths are found. Your health care provider may recommend screening at an earlier age if you have risk factors for colon cancer.  Your health care provider may recommend using home test kits to check for hidden blood in the stool.  A small camera at the end of a tube can be used to examine your colon (sigmoidoscopy or colonoscopy). This checks for the earliest forms of colorectal cancer.  Prostate and Testicular Cancer  Depending on your age and overall health, your health care provider may do certain tests to screen for prostate and testicular cancer.  Talk to your health care provider about any symptoms or concerns you have about testicular or prostate cancer.  Skin Cancer  Check your skin from head to toe regularly.  Tell your health care provider about any new moles or changes in moles, especially if: ? There is a change in a mole's size, shape, or color. ? You have a mole that is larger than a pencil eraser.  Always use sunscreen. Apply sunscreen liberally and repeat throughout the day.  Protect yourself by wearing long sleeves, pants, a wide-brimmed hat, and sunglasses when outside.  What should I  know about heart disease, diabetes, and high blood pressure?  If you are 27-59 years of age, have your blood pressure checked every 3-5 years. If you are 93 years of age or older, have your blood pressure checked every year. You should have your blood pressure measured twice-once when you are at a hospital or clinic, and once when you are not at a hospital or clinic. Record the average of the two measurements. To check your blood pressure when you are not at a hospital or clinic, you can use: ? An automated blood pressure machine at a pharmacy. ? A home blood  pressure monitor.  Talk to your health care provider about your target blood pressure.  If you are between 26-40 years old, ask your health care provider if you should take aspirin to prevent heart disease.  Have regular diabetes screenings by checking your fasting blood sugar level. ? If you are at a normal weight and have a low risk for diabetes, have this test once every three years after the age of 20. ? If you are overweight and have a high risk for diabetes, consider being tested at a younger age or more often.  A one-time screening for abdominal aortic aneurysm (AAA) by ultrasound is recommended for men aged 49-75 years who are current or former smokers. What should I know about preventing infection? Hepatitis B If you have a higher risk for hepatitis B, you should be screened for this virus. Talk with your health care provider to find out if you are at risk for hepatitis B infection. Hepatitis C Blood testing is recommended for:  Everyone born from [birth year] through 1966.  Anyone with known risk factors for hepatitis C.  Sexually Transmitted Diseases (STDs)  You should be screened each year for STDs including gonorrhea and chlamydia if: ? You are sexually active and are younger than 58 years of age. ? You are older than 58 years of age and your health care provider tells you that you are at risk for this type of infection. ? Your sexual activity has changed since you were last screened and you are at an increased risk for chlamydia or gonorrhea. Ask your health care provider if you are at risk.  Talk with your health care provider about whether you are at high risk of being infected with HIV. Your health care provider may recommend a prescription medicine to help prevent HIV infection.  What else can I do?  Schedule regular health, dental, and eye exams.  Stay current with your vaccines (immunizations).  Do not use any tobacco products, such as cigarettes, chewing tobacco, and  e-cigarettes. If you need help quitting, ask your health care provider.  Limit alcohol intake to no more than 2 drinks per day. One drink equals 12 ounces of beer, 5 ounces of wine, or 1 ounces of hard liquor.  Do not use street drugs.  Do not share needles.  Ask your health care provider for help if you need support or information about quitting drugs.  Tell your health care provider if you often feel depressed.  Tell your health care provider if you have ever been abused or do not feel safe at home. This information is not intended to replace advice given to you by your health care provider. Make sure you discuss any questions you have with your health care provider. Document Released: 05/29/2008 Document Revised: 07/30/2016 Document Reviewed: 09/04/2015 Elsevier Interactive Patient Education  [employer].

## 2018-12-13 ENCOUNTER — Encounter: Payer: Self-pay | Admitting: Family Medicine

## 2018-12-14 MED ORDER — ESOMEPRAZOLE MAGNESIUM 40 MG PO PACK: 40.0000 mg | PACK | Freq: Every day | ORAL | 3 refills | Status: DC

## 2019-01-23 ENCOUNTER — Encounter: Payer: Self-pay | Admitting: Family Medicine

## 2019-01-23 DIAGNOSIS — G5712 Meralgia paresthetica, left lower limb: Secondary | ICD-10-CM

## 2019-01-24 MED ORDER — GABAPENTIN 100 MG PO CAPS: 100.0000 mg | ORAL_CAPSULE | Freq: Three times a day (TID) | ORAL | 1 refills | Status: DC

## 2019-01-24 MED ORDER — GABAPENTIN 100 MG PO CAPS: 100.0000 mg | ORAL_CAPSULE | Freq: Three times a day (TID) | ORAL | 0 refills | Status: DC

## 2019-01-24 MED FILL — GABAPENTIN 100 MG CAPSULE: 100 | 30 days supply | Qty: 90 | Fill #0

## 2019-01-25 ENCOUNTER — Other Ambulatory Visit: Payer: Self-pay | Admitting: Family Medicine

## 2019-01-25 DIAGNOSIS — G5712 Meralgia paresthetica, left lower limb: Secondary | ICD-10-CM

## 2019-01-26 ENCOUNTER — Other Ambulatory Visit: Payer: Self-pay

## 2019-01-26 DIAGNOSIS — G5712 Meralgia paresthetica, left lower limb: Secondary | ICD-10-CM

## 2019-01-26 MED ORDER — GABAPENTIN 100 MG PO CAPS: 100.0000 mg | ORAL_CAPSULE | Freq: Three times a day (TID) | ORAL | 0 refills | Status: DC

## 2019-01-27 MED ORDER — GABAPENTIN 100 MG PO CAPS: 100.0000 mg | ORAL_CAPSULE | Freq: Three times a day (TID) | ORAL | 3 refills | Status: DC

## 2019-01-27 NOTE — Addendum Note (Signed)
Addended byDamita Dunnings D on: 01/27/2019 08:11 AM   Modules accepted: Orders

## 2019-02-01 ENCOUNTER — Other Ambulatory Visit: Payer: Self-pay

## 2019-02-03 ENCOUNTER — Telehealth: Payer: Self-pay

## 2019-02-03 DIAGNOSIS — G5712 Meralgia paresthetica, left lower limb: Secondary | ICD-10-CM

## 2019-02-03 MED ORDER — GABAPENTIN 100 MG PO CAPS: 100.0000 mg | ORAL_CAPSULE | Freq: Three times a day (TID) | ORAL | 3 refills | Status: DC

## 2019-02-03 NOTE — Telephone Encounter (Signed)
ok 

## 2019-02-03 NOTE — Telephone Encounter (Signed)
Copied from [place]. Topic: General - Other >> Jan 26, 2019  9:36 AM Leward Quan A wrote: Reason for CRM: Pharmacy called to say that gabapentin (NEURONTIN) 100 MG capsule  Rx was not received ask for it to be faxed to [phone]

## 2019-02-03 NOTE — Addendum Note (Signed)
Addended by: Lamar Blinks C on: 02/03/2019 04:51 PM   Modules accepted: Orders

## 2019-02-13 ENCOUNTER — Encounter: Payer: Self-pay | Admitting: Family Medicine

## 2019-02-15 MED ORDER — GABAPENTIN 100 MG PO CAPS: 100.0000 mg | ORAL_CAPSULE | Freq: Three times a day (TID) | ORAL | 3 refills | Status: DC

## 2019-02-18 MED FILL — GABAPENTIN 100 MG CAPSULE: 100 | 30 days supply | Qty: 90 | Fill #0

## 2019-02-23 ENCOUNTER — Ambulatory Visit: Payer: 59 | Admitting: Family Medicine

## 2019-03-16 ENCOUNTER — Encounter: Payer: Self-pay | Admitting: Family Medicine

## 2019-03-16 DIAGNOSIS — G5712 Meralgia paresthetica, left lower limb: Secondary | ICD-10-CM

## 2019-03-17 MED ORDER — GABAPENTIN 100 MG PO CAPS: 100.0000 mg | ORAL_CAPSULE | Freq: Three times a day (TID) | ORAL | 3 refills | Status: DC

## 2019-03-22 ENCOUNTER — Ambulatory Visit: Payer: 59 | Admitting: Internal Medicine

## 2019-03-28 ENCOUNTER — Encounter: Payer: Self-pay | Admitting: Internal Medicine

## 2019-03-28 ENCOUNTER — Other Ambulatory Visit: Payer: Self-pay

## 2019-03-28 ENCOUNTER — Ambulatory Visit (INDEPENDENT_AMBULATORY_CARE_PROVIDER_SITE_OTHER): Payer: 59 | Admitting: Internal Medicine

## 2019-03-28 DIAGNOSIS — K219 Gastro-esophageal reflux disease without esophagitis: Secondary | ICD-10-CM

## 2019-03-28 DIAGNOSIS — K5730 Diverticulosis of large intestine without perforation or abscess without bleeding: Secondary | ICD-10-CM

## 2019-03-28 DIAGNOSIS — G8929 Other chronic pain: Secondary | ICD-10-CM

## 2019-03-28 DIAGNOSIS — R945 Abnormal results of liver function studies: Secondary | ICD-10-CM

## 2019-03-28 DIAGNOSIS — R197 Diarrhea, unspecified: Secondary | ICD-10-CM

## 2019-03-28 DIAGNOSIS — R1013 Epigastric pain: Secondary | ICD-10-CM | POA: Diagnosis not present

## 2019-03-28 DIAGNOSIS — R7989 Other specified abnormal findings of blood chemistry: Secondary | ICD-10-CM

## 2019-03-28 NOTE — Progress Notes (Signed)
HISTORY OF PRESENT ILLNESS:  Curtis Luna is a 58 y.o. male, native of [place], who was initially evaluated by me October 01, 2017 regarding intermittent problems with nocturnal abdominal discomfort followed by diarrhea, history of complicated diverticulitis requiring sigmoid colectomy, GERD, and elevated liver test.  See that dictation for details.  Testing for celiac disease was negative.  Repeat liver tests were normal.  He was instructed to use Metamucil for his bowels.  He was asked to follow-up in 3 months but did not.  The patient contact the office at this time to schedule this telemedicine visit (in the face of the coronavirus pandemic) with a chief complaint of epigastric pain.  First, he tells me that he continues to have his chronic issue that when he lies on his left side he will have mid lower abdominal discomfort followed by diarrhea.  However if he sleeps on his right side, no problems.  His current issue is the development of epigastric pain about 6 weeks ago.  This was quite severe over the course of 4 days then resolved.  He has had this intermittently over the past 2 to 3 weeks.  Typically lasts about 1 day.  There is no associated nausea or vomiting.  He does report 5 pound weight loss.  He denies that his appetite is less.  He cannot identify any exacerbating or relieving factors.  His pain is not influenced by food or movement.  He is concerned.  Of note, patient had been on Nexium in years past but was switched to ranitidine 150 mg twice daily approximately 5 years ago.  This was recently taken off the market and he was placed back on Nexium 20 mg daily.  He feels that his reflux symptoms have somewhat worsened with these changes.  He is now taking Nexium 40 mg daily OTC which has helped.  We discussed cost effective PPI therapy.  He did undergo upper endoscopy remotely with Dr. Sharlett Iles in 2012 regarding GERD.  This was normal.  I did not see any additional blood work since October  2018.  Liver tests at that time were normal.  His last colonoscopy was 2012.  Severe diverticulosis noted.  No neoplasia.  Follow-up in 10 years recommended.  The patient does tell me that he will have occasional discomfort in his left inguinal region where he had inguinal hernia repair 6 years ago.  He does not notice recurrence of his hernia however  REVIEW OF SYSTEMS:  All non-GI ROS negative less otherwise stated in the HPI except for back pain  Past Medical History:  Diagnosis Date  . Abdominal pain, right lower quadrant   . Acute bronchospasm   . Backache, unspecified   . Chicken pox   . Diverticulosis of colon (without mention of hemorrhage)   . Esophageal reflux   . External hemorrhoids without mention of complication    thrombosed  . Hiatal hernia   . Inguinal hernia without mention of obstruction or gangrene, unilateral or unspecified, (not specified as recurrent)   . Migraine   . Obstructive sleep apnea (adult) (pediatric)   . Other symptoms involving digestive system(787.99)   . Pain in joint, forearm   . Pain in joint, shoulder region   . Seasonal allergies   . Thoracic or lumbosacral neuritis or radiculitis, unspecified     Past Surgical History:  Procedure Laterality Date  . COLON RESECTION  10/06   sigmoid colon  . INGUINAL HERNIA REPAIR  12/2012   Left  .  WISDOM TOOTH EXTRACTION      Social History Curtis Luna  reports that he quit smoking about 31 years ago. His smoking use included cigarettes and cigars. He has never used smokeless tobacco. He reports current alcohol use of about 7.0 - 14.0 standard drinks of alcohol per week. He reports that he does not use drugs.  family history includes Allergies in his daughter; Aneurysm in his sister; Diabetes in his maternal aunt; Healthy in his brother; Heart attack in his paternal grandmother; Heart disease in his paternal grandmother; Lung cancer (age of onset: 37) in his mother; Migraines in his daughter; Other in  his father.  Allergies  Allergen Reactions  . Escitalopram Hives    Pt felt anxious  Pt felt his skin was "on fire"  . Protonix [Pantoprazole Sodium] Other (See Comments)    Tingling of extremities       PHYSICAL EXAMINATION: The physical examination with telemedicine visit    ASSESSMENT:  1.  6 weeks of epigastric pain with mild weight loss.  Etiology unclear.  Rule out pancreaticobiliary disorders.  Rule out acid peptic disorders 2.  GERD.  Requiring acid suppressive therapy 3.  Chronic problem with lower abdominal discomfort and diarrhea nocturnally when lying on his left side but not right.  Not sure what to make of this 4.  History of complicated diverticular disease requiring sigmoid colectomy.  Last colonoscopy 2012 5.  History of transiently elevated liver test.  Resolved  PLAN:  1.  Schedule abdominal ultrasound to evaluate epigastric pain 2.  Schedule upper endoscopy with me in the [place] during my next available scheduled session.The nature of the procedure, as well as the risks, benefits, and alternatives were carefully and thoroughly reviewed with the patient. Ample time for discussion and questions allowed. The patient understood, was satisfied, and agreed to proceed. 3.  Sleep on right side at night 4.  Routine colonoscopy due around 2022 5.  PRESCRIBE PANTOPRAZOLE 40 mg daily; #30; 11 refills  This telemedicine visit was initiated by and consented for by the patient.  He was in his home and I was in my office.  He understands her may be an associated professional charge

## 2019-03-29 ENCOUNTER — Telehealth: Payer: Self-pay | Admitting: *Deleted

## 2019-03-29 MED ORDER — PANTOPRAZOLE SODIUM 40 MG PO TBEC: 40.0000 mg | DELAYED_RELEASE_TABLET | Freq: Every day | ORAL | 11 refills | Status: DC

## 2019-03-29 MED FILL — PANTOPRAZOLE SOD DR 40 MG T: 40 | 30 days supply | Qty: 30 | Fill #0

## 2019-03-29 NOTE — Telephone Encounter (Signed)
I contacted patient to advise that we have scheduled him for ultrasound at [facility] on 04/05/19 at 8:45 am, NPO 6 hours prior and arrival at 8:15 am.  He verbalizes understanding.  I have also advised that we have set him up for endoscopy at [facility] on 04/07/19 at 4 pm. As I was getting ready to go over prep instructions, patient indicated that he would rather wait until a later date due to his wifes busy schedule. He also states that he would rather have an earlier morning appointment. I advised that unfortunately, we are on a very reduced schedule and are only doing urgent cases so this is the only time we have available right now. He states that he wishes to continue to hold off for now.  Patient has been placed on wait list.

## 2019-03-29 NOTE — Addendum Note (Signed)
Addended by: Larina Bras on: 03/29/2019 12:47 PM   Modules accepted: Orders

## 2019-03-29 NOTE — Patient Instructions (Addendum)
We have sent the following medications to your pharmacy for you to pick up at your convenience: Pantoprazole 40 mg daily  Please try to sleep on your right side at night.  You have been scheduled for an abdominal ultrasound at [facility] on 04/05/2019 at 8:45 am. Please arrive 15 minutes prior to your appointment for registration. Make certain not to have anything to eat or drink 6 hours prior to your appointment. Should you need to reschedule your appointment, please contact radiology at [phone]. This test typically takes about 30 minutes to perform.  It has been recommended to you by your physician that you have a(n) endoscopy completed. Per your request, we did not schedule the procedure(s) today on an urgent basis. We will contact you as COVID-19 restrictions lift and schedules allow. If you do not hear from [place], you may contact our office at [phone] to have your procedure scheduled.

## 2019-03-29 NOTE — Addendum Note (Signed)
Addended by: Larina Bras on: 03/29/2019 12:45 PM   Modules accepted: Orders

## 2019-04-05 ENCOUNTER — Ambulatory Visit (HOSPITAL_BASED_OUTPATIENT_CLINIC_OR_DEPARTMENT_OTHER)
Admission: RE | Admit: 2019-04-05 | Discharge: 2019-04-05 | Disposition: A | Discharge status: Home / Self Care | Payer: 59 | Source: Ambulatory Visit | Attending: Internal Medicine | Admitting: Internal Medicine

## 2019-04-05 ENCOUNTER — Other Ambulatory Visit: Payer: Self-pay

## 2019-04-05 DIAGNOSIS — R945 Abnormal results of liver function studies: Secondary | ICD-10-CM | POA: Diagnosis not present

## 2019-04-05 DIAGNOSIS — R7989 Other specified abnormal findings of blood chemistry: Secondary | ICD-10-CM

## 2019-04-05 DIAGNOSIS — G8929 Other chronic pain: Secondary | ICD-10-CM | POA: Diagnosis not present

## 2019-04-05 DIAGNOSIS — R1013 Epigastric pain: Secondary | ICD-10-CM | POA: Diagnosis present

## 2019-04-07 ENCOUNTER — Encounter: Payer: 59 | Admitting: Internal Medicine

## 2019-04-11 ENCOUNTER — Other Ambulatory Visit: Payer: Self-pay

## 2019-04-11 MED ORDER — ESOMEPRAZOLE MAGNESIUM 40 MG PO CPDR: 40.0000 mg | DELAYED_RELEASE_CAPSULE | Freq: Every day | ORAL | 3 refills | Status: DC

## 2019-05-06 ENCOUNTER — Other Ambulatory Visit: Payer: Self-pay

## 2019-05-06 ENCOUNTER — Ambulatory Visit (AMBULATORY_SURGERY_CENTER): Payer: 59 | Admitting: *Deleted

## 2019-05-06 VITALS — Ht 65.0 in | Wt 152.0 lb

## 2019-05-06 DIAGNOSIS — R1013 Epigastric pain: Secondary | ICD-10-CM

## 2019-05-06 NOTE — Progress Notes (Signed)
No egg or soy allergy known to patient  No issues with past sedation with any surgeries  or procedures, no intubation problems  No diet pills per patient No home 02 use per patient  No blood thinners per patient  Pt denies issues with constipation  No A fib or A flutter  EMMI video sent to pt's e mail    Pt mailed instruction packet to included paper to complete and mail back to [facility] with addressed and stamped envelope, Emmi video, copy of consent form to read and not return, and instructions . PV completed over the phone. Pt encouraged to call with questions or issues

## 2019-05-10 ENCOUNTER — Encounter: Payer: Self-pay | Admitting: Internal Medicine

## 2019-05-20 ENCOUNTER — Telehealth: Payer: Self-pay | Admitting: *Deleted

## 2019-05-20 NOTE — Telephone Encounter (Signed)
Covid-19 screening questions  Have you traveled in the last 14 days? No. If yes where?  Do you now or have you had a fever in the last 14 days? No.  Do you have any respiratory symptoms of shortness of breath or cough now or in the last 14 days? No.  Do you have any family members or close contacts with diagnosed or suspected Covid-19 in the past 14 days? No.  Have you been tested for Covid-19 and found to be positive? No.       

## 2019-05-23 ENCOUNTER — Other Ambulatory Visit: Payer: Self-pay

## 2019-05-23 ENCOUNTER — Encounter: Payer: Self-pay | Admitting: Internal Medicine

## 2019-05-23 ENCOUNTER — Ambulatory Visit (AMBULATORY_SURGERY_CENTER): Payer: 59 | Admitting: Internal Medicine

## 2019-05-23 VITALS — BP 101/63 | HR 73 | Temp 98.3°F | Resp 14 | Ht 65.0 in | Wt 152.0 lb

## 2019-05-23 DIAGNOSIS — K219 Gastro-esophageal reflux disease without esophagitis: Secondary | ICD-10-CM | POA: Diagnosis not present

## 2019-05-23 DIAGNOSIS — R1013 Epigastric pain: Secondary | ICD-10-CM | POA: Diagnosis not present

## 2019-05-23 MED ORDER — SODIUM CHLORIDE 0.9 % IV SOLN: 500.0000 mL | Freq: Once | INTRAVENOUS | Status: DC

## 2019-05-23 NOTE — Progress Notes (Signed)
PT taken to PACU. Monitors in place. VSS. Report given to RN. 

## 2019-05-23 NOTE — Progress Notes (Signed)
Pt's states no medical or surgical changes since previsit or office visit. 

## 2019-05-23 NOTE — Patient Instructions (Signed)
YOU HAD AN ENDOSCOPIC PROCEDURE TODAY AT [facility]:   Refer to the procedure report that was given to you for any specific questions about what was found during the examination.  If the procedure report does not answer your questions, please call your gastroenterologist to clarify.  If you requested that your care partner not be given the details of your procedure findings, then the procedure report has been included in a sealed envelope for you to review at your convenience later.  YOU SHOULD EXPECT: Some feelings of bloating in the abdomen. Passage of more gas than usual.  Walking can help get rid of the air that was put into your GI tract during the procedure and reduce the bloating. If you had a lower endoscopy (such as a colonoscopy or flexible sigmoidoscopy) you may notice spotting of blood in your stool or on the toilet paper. If you underwent a bowel prep for your procedure, you may not have a normal bowel movement for a few days.  Please Note:  You might notice some irritation and congestion in your nose or some drainage.  This is from the oxygen used during your procedure.  There is no need for concern and it should clear up in a day or so.  SYMPTOMS TO REPORT IMMEDIATELY:   Following upper endoscopy (EGD)  Vomiting of blood or coffee ground material  New chest pain or pain under the shoulder blades  Painful or persistently difficult swallowing  New shortness of breath  Fever of 100F or higher  Black, tarry-looking stools  For urgent or emergent issues, a gastroenterologist can be reached at any hour by calling [phone].   DIET:  We do recommend a small meal at first, but then you may proceed to your regular diet.  Drink plenty of fluids but you should avoid alcoholic beverages for 24 hours.  MEDICATIONS: Resume previous medications.  Please see handouts given to you by your recovery nurse.  ACTIVITY:  You should plan to take it easy for the rest of today  and you should NOT DRIVE or use heavy machinery until tomorrow (because of the sedation medicines used during the test).    FOLLOW UP: Our staff will call the number listed on your records 48-72 hours following your procedure to check on you and address any questions or concerns that you may have regarding the information given to you following your procedure. If we do not reach you, we will leave a message.  We will attempt to reach you two times.  During this call, we will ask if you have developed any symptoms of COVID 19. If you develop any symptoms (ie: fever, flu-like symptoms, shortness of breath, cough etc.) before then, please call [phone].  If you test positive for Covid 19 in the 2 weeks post procedure, please call and report this information to [place].    If any biopsies were taken you will be contacted by phone or by letter within the next 1-3 weeks.  Please call us at [phone] if you have not heard about the biopsies in 3 weeks.   Thank you for allowing [place] to provide for your healthcare needs today.   SIGNATURES/CONFIDENTIALITY: You and/or your care partner have signed paperwork which will be entered into your electronic medical record.  These signatures attest to the fact that that the information above on your After Visit Summary has been reviewed and is understood.  Full responsibility of the confidentiality of this discharge information  lies with you and/or your care-partner.

## 2019-05-23 NOTE — Op Note (Signed)
[facility] Patient Name: Curtis Luna Procedure Date: 05/23/2019 8:42 AM MRN: [id number] Endoscopist: Docia Chuck. Henrene Pastor , MD Age: 58 Referring MD:  Date of Birth: 06-26-1961 Gender: Male Account #: [id number] Procedure:                Upper GI endoscopy Indications:              Epigastric abdominal pain. Doing better since                            instituting daily PPI therapy (pantoprazole 40 mg) Medicines:                Monitored Anesthesia Care Procedure:                Pre-Anesthesia Assessment:                           - Prior to the procedure, a History and Physical                            was performed, and patient medications and                            allergies were reviewed. The patient's tolerance of                            previous anesthesia was also reviewed. The risks                            and benefits of the procedure and the sedation                            options and risks were discussed with the patient.                            All questions were answered, and informed consent                            was obtained. Prior Anticoagulants: The patient has                            taken no previous anticoagulant or antiplatelet                            agents. ASA Grade Assessment: II - A patient with                            mild systemic disease. After reviewing the risks                            and benefits, the patient was deemed in                            satisfactory condition to undergo the procedure.  After obtaining informed consent, the endoscope was                            passed under direct vision. Throughout the                            procedure, the patient's blood pressure, pulse, and                            oxygen saturations were monitored continuously. The                            Endoscope was introduced through the mouth, and                            advanced to the  second part of duodenum. The upper                            GI endoscopy was accomplished without difficulty.                            The patient tolerated the procedure well. Scope In: Scope Out: Findings:                 The esophagus was normal.                           The stomach was normal.                           The examined duodenum was normal.                           The cardia and gastric fundus were normal on                            retroflexion. Complications:            No immediate complications. Estimated Blood Loss:     Estimated blood loss: none. Impression:               1. Normal EGD                           2. GERD. Recommendation:           - Patient has a contact number available for                            emergencies. The signs and symptoms of potential                            delayed complications were discussed with the                            patient. Return to normal activities tomorrow.  Written discharge instructions were provided to the                            patient.                           - Resume previous diet.                           - Continue present medications.                           - GI follow-up as needed Curtis Luna N. Henrene Pastor, MD 05/23/2019 8:53:04 AM This report has been signed electronically.

## 2019-05-25 ENCOUNTER — Encounter: Payer: Self-pay | Admitting: *Deleted

## 2019-05-25 ENCOUNTER — Telehealth: Payer: Self-pay

## 2019-05-25 NOTE — Telephone Encounter (Signed)
Opened telephone tab in error. Patient already received f/u call.

## 2019-05-25 NOTE — Telephone Encounter (Signed)
  Follow up Call-  Call back number 05/23/2019  Post procedure Call Back phone  # [phone]  Permission to leave phone message Yes  Some recent data might be hidden     Patient questions:  Do you have a fever, pain , or abdominal swelling? No. Pain Score  0 *  Have you tolerated food without any problems? Yes.    Have you been able to return to your normal activities? Yes.    Do you have any questions about your discharge instructions: Diet   No. Medications  No. Follow up visit  No.  Do you have questions or concerns about your Care? No.  Actions: * If pain score is 4 or above: No action needed, pain <4.   1. Have you developed a fever since your procedure? no  2.   Have you had an respiratory symptoms (SOB or cough) since your procedure?no  3.   Have you tested positive for COVID 19 since your procedure no  4.   Have you had any family members/close contacts diagnosed with the COVID 19 since your procedure?  no   If yes to any of these questions please route to Joylene John, RN and Alphonsa Gin, Therapist, sports.

## 2019-07-23 NOTE — Progress Notes (Addendum)
[place] at [employer] [place], [place], [place] [address] [phone]  Date:  07/25/2019   Name:  Curtis Luna   DOB:  July 19, 1961   MRN:  [id number]  PCP:  Darreld Mclean, MD    Chief Complaint: Annual Exam   History of Present Illness:  Curtis Luna is a 58 y.o. 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  We will check on his labs today - he would be willing to try statin again if needed   Repeat thyroid US done 12/2017 and it was normal  He did have the chicken pox when he was a child   He wonders if he might have "cervical vertigo" causing chronic dizziness; he has done ENT and other eval for this condition already.   He would prefer to see neurology in [place] if he can    He is exercising on his treadmill at home as much as he can   Patient Active Problem List   Diagnosis Date Noted  . Toe pain, right 08/23/2018  . Pain of left heel 06/27/2018  . Multiple thyroid nodules 11/05/2015  . Peripheral neuropathy 04/18/2015  . Vertigo 04/15/2015  . FH: brain aneurysm 04/15/2015  . 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 Seasonal affective disorder ([place]) 10/02/2014  . Internal hemorrhoid 04/20/2014  . Chronic low back pain 04/20/2014  . Anxiety 04/20/2014  . Esophageal  reflux 04/04/2011  . Diverticulosis of colon (without mention of hemorrhage) 04/04/2011    Past Medical History:  Diagnosis Date  . Abdominal pain, right lower quadrant   . Acute bronchospasm   . Allergy   . Anxiety    on and off  . Backache, unspecified   . Chicken pox   . Diverticulosis of colon (without mention of hemorrhage)   . Esophageal reflux   . External hemorrhoids without mention of complication    thrombosed  . Hiatal hernia   . Hyperlipidemia    no medicines   . Inguinal hernia without mention of obstruction or gangrene, unilateral or unspecified, (not specified as recurrent)   . Migraine   . Obstructive sleep apnea (adult) (pediatric)   . Other symptoms involving digestive system(787.99)   . Pain in joint, forearm   . Pain in joint, shoulder region   . Seasonal allergies   . Sleep apnea    no cpap but uses dental device   . Thoracic or lumbosacral neuritis or radiculitis, unspecified     Past Surgical History:  Procedure Laterality Date  . COLON RESECTION  10/06   sigmoid colon  . COLONOSCOPY    . INGUINAL HERNIA REPAIR  12/2012   Left  . UPPER GASTROINTESTINAL ENDOSCOPY    .  WISDOM TOOTH EXTRACTION      Social History   Tobacco Use  . Smoking status: Former Smoker    Types: Cigarettes, Cigars    Quit date: 10/04/1987    Years since quitting: 31.8  . Smokeless tobacco: Never Used  Substance Use Topics  . Alcohol use: Yes    Alcohol/week: 7.0 - 14.0 standard drinks    Types: 7 - 14 Standard drinks or equivalent per week    Comment: 2 daily   . Drug use: No    Family History  Problem Relation Age of Onset  . Other Father        MVA  . Lung cancer Mother 17       Deceased  . Diabetes Maternal Aunt   . Heart disease Paternal Grandmother   . Heart attack Paternal Grandmother   . Healthy Brother        x2  . Aneurysm Sister        Brain  . Allergies Daughter        x2  . Migraines Daughter        x1  . Colon cancer Neg Hx   . Colon  polyps Neg Hx   . Esophageal cancer Neg Hx   . Rectal cancer Neg Hx   . Stomach cancer Neg Hx   . Pancreatic cancer Neg Hx     Allergies  Allergen Reactions  . Escitalopram Hives    Pt felt anxious  Pt felt his skin was "on fire"  . Protonix [Pantoprazole Sodium] Other (See Comments)    Tingling of extremities    Medication list has been reviewed and updated.  Current Outpatient Medications on File Prior to Visit  Medication Sig Dispense Refill  . b complex vitamins tablet Take 1 tablet by mouth daily. Every other day    . celecoxib (CELEBREX) 200 MG capsule Take 1 capsule (200 mg total) by mouth daily. Use as needed for pain 90 capsule 3  . cholecalciferol (VITAMIN D3) 25 MCG (1000 UT) tablet Take 1,000 Units by mouth daily.    [name] esomeprazole (NEXIUM) 40 MG capsule Take 1 capsule (40 mg total) by mouth daily at 12 noon. 90 capsule 3  . gabapentin (NEURONTIN) 100 MG capsule Take 1 capsule (100 mg total) by mouth 3 (three) times daily. 270 capsule 3  . LORazepam (ATIVAN) 1 MG tablet Take 1 tablet (1 mg total) by mouth as needed for anxiety. May take 1 per day 30 tablet 0  . Multiple Vitamin (MULTIVITAMIN) tablet Take 1 tablet by mouth daily. Men's 50 plus multivitamin daily     Current Facility-Administered Medications on File Prior to Visit  Medication Dose Route Frequency Provider Last Rate Last Dose  . 0.9 %  sodium chloride infusion  500 mL Intravenous Once Irene Shipper, MD        Review of Systems:  As per HPI- otherwise negative. No Cp or SOB with exercise No genital or skin concerns   Physical Examination: Vitals:   07/25/19 0938  BP: 128/70  Pulse: 68  Resp: 16  Temp: 98.1 F (36.7 C)  SpO2: 97%   Vitals:   07/25/19 0938  Weight: 148 lb (67.1 kg)  Height: 5\' 5"  (1.651 m)   Body mass index is 24.63 kg/m. Ideal Body Weight: Weight in (lb) to have BMI = 25: 149.9  GEN: WDWN, NAD, Non-toxic, A & O x 3, normal weight, looks well  HEENT: Atraumatic,  Normocephalic. Neck supple. No masses,  No LAD.  TM wnl bilaterally  Ears and Nose: No external deformity. CV: RRR, No M/G/R. No JVD. No thrill. No extra heart sounds. PULM: CTA B, no wheezes, crackles, rhonchi. No retractions. No resp. distress. No accessory muscle use. ABD: S, NT, ND, +BS. No rebound. No HSM. EXTR: No c/c/e NEURO Normal gait.  PSYCH: Normally interactive. Conversant. Not depressed or anxious appearing.  Calm demeanor.    Assessment and Plan:   ICD-10-CM   1. Physical exam  Z00.00   2. Screening for diabetes mellitus  Z13.1 Comprehensive metabolic panel    Hemoglobin A1c  3. Screening for prostate cancer  Z12.5 PSA  4. Screening for deficiency anemia  Z13.0 CBC  5. Multiple thyroid nodules  E04.2 TSH  6. Dyslipidemia  E78.5 Lipid panel  7. Immunization due  Z23 Tdap vaccine greater than or equal to 7yo IM  8. Dizziness  R42 Ambulatory referral to Neurology   CPE today- labs pending as above Gave tdap He is thinking about shingrix Will plan further follow- up pending labs.   Follow-up: No follow-ups on file.  No orders of the defined types were placed in this encounter.  Orders Placed This Encounter  Procedures  . Tdap vaccine greater than or equal to 7yo IM  . CBC  . Comprehensive metabolic panel  . Hemoglobin A1c  . Lipid panel  . PSA  . TSH  . Ambulatory referral to Neurology    @SIGN @   Signed Lamar Blinks, MD  Received his labs, message to pt  Results for orders placed or performed in visit on 07/25/19  CBC  Result Value Ref Range   WBC 5.5 4.0 - 10.5 K/uL   RBC 4.54 4.22 - 5.81 Mil/uL   Platelets 294.0 150.0 - 400.0 K/uL   Hemoglobin 14.5 13.0 - 17.0 g/dL   HCT 42.6 39.0 - 52.0 %   MCV 93.9 78.0 - 100.0 fl   MCHC 34.1 30.0 - 36.0 g/dL   RDW 14.0 11.5 - 15.5 %  Comprehensive metabolic panel  Result Value Ref Range   Sodium 140 135 - 145 mEq/L   Potassium 4.6 3.5 - 5.1 mEq/L   Chloride 104 96 - 112 mEq/L   CO2 30 19 - 32 mEq/L    Glucose, Bld 87 70 - 99 mg/dL   BUN 19 6 - 23 mg/dL   Creatinine, Ser 0.97 0.40 - 1.50 mg/dL   Total Bilirubin 0.4 0.2 - 1.2 mg/dL   Alkaline Phosphatase 51 39 - 117 U/L   AST 22 0 - 37 U/L   ALT 18 0 - 53 U/L   Total Protein 6.6 6.0 - 8.3 g/dL   Albumin 4.4 3.5 - 5.2 g/dL   Calcium 9.7 8.4 - 10.5 mg/dL   GFR 79.17 >60.00 mL/min  Hemoglobin A1c  Result Value Ref Range   Hgb A1c MFr Bld 5.7 4.6 - 6.5 %  Lipid panel  Result Value Ref Range   Cholesterol 204 (H) 0 - 200 mg/dL   Triglycerides 121.0 0.0 - 149.0 mg/dL   HDL 44.30 >39.00 mg/dL   VLDL 24.2 0.0 - 40.0 mg/dL   LDL Cholesterol 135 (H) 0 - 99 mg/dL   Total CHOL/HDL Ratio 5    NonHDL 159.64   PSA  Result Value Ref Range   PSA 0.77 0.10 - 4.00 ng/mL  TSH  Result Value Ref Range   TSH 1.31 0.35 - 4.50 uIU/mL

## 2019-07-25 ENCOUNTER — Other Ambulatory Visit: Payer: Self-pay | Admitting: Family Medicine

## 2019-07-25 ENCOUNTER — Encounter: Payer: Self-pay | Admitting: Family Medicine

## 2019-07-25 ENCOUNTER — Ambulatory Visit (INDEPENDENT_AMBULATORY_CARE_PROVIDER_SITE_OTHER): Payer: 59 | Admitting: Family Medicine

## 2019-07-25 ENCOUNTER — Other Ambulatory Visit: Payer: Self-pay

## 2019-07-25 VITALS — BP 128/70 | HR 68 | Temp 98.1°F | Resp 16 | Ht 65.0 in | Wt 148.0 lb

## 2019-07-25 DIAGNOSIS — E042 Nontoxic multinodular goiter: Secondary | ICD-10-CM | POA: Diagnosis not present

## 2019-07-25 DIAGNOSIS — E785 Hyperlipidemia, unspecified: Secondary | ICD-10-CM | POA: Diagnosis not present

## 2019-07-25 DIAGNOSIS — Z125 Encounter for screening for malignant neoplasm of prostate: Secondary | ICD-10-CM

## 2019-07-25 DIAGNOSIS — Z131 Encounter for screening for diabetes mellitus: Secondary | ICD-10-CM | POA: Diagnosis not present

## 2019-07-25 DIAGNOSIS — Z130 Encounter for screening for diseases of the blood and blood-forming organs and certain disorders involving the immune mechanism: Secondary | ICD-10-CM

## 2019-07-25 DIAGNOSIS — Z23 Encounter for immunization: Secondary | ICD-10-CM

## 2019-07-25 DIAGNOSIS — Z0000 Encounter for general adult medical examination without abnormal findings: Secondary | ICD-10-CM

## 2019-07-25 DIAGNOSIS — R42 Dizziness and giddiness: Secondary | ICD-10-CM

## 2019-07-25 LAB — CBC
HCT: 42.6 % (ref 39.0–52.0)
Hemoglobin: 14.5 g/dL (ref 13.0–17.0)
MCHC: 34.1 g/dL (ref 30.0–36.0)
MCV: 93.9 fl (ref 78.0–100.0)
Platelets: 294 10*3/uL (ref 150.0–400.0)
RBC: 4.54 Mil/uL (ref 4.22–5.81)
RDW: 14 % (ref 11.5–15.5)
WBC: 5.5 10*3/uL (ref 4.0–10.5)

## 2019-07-25 LAB — COMPREHENSIVE METABOLIC PANEL
ALT: 18 U/L (ref 0–53)
AST: 22 U/L (ref 0–37)
Albumin: 4.4 g/dL (ref 3.5–5.2)
Alkaline Phosphatase: 51 U/L (ref 39–117)
BUN: 19 mg/dL (ref 6–23)
CO2: 30 mEq/L (ref 19–32)
Calcium: 9.7 mg/dL (ref 8.4–10.5)
Chloride: 104 mEq/L (ref 96–112)
Creatinine, Ser: 0.97 mg/dL (ref 0.40–1.50)
GFR: 79.17 mL/min (ref >60.00)
Glucose, Bld: 87 mg/dL (ref 70–99)
Potassium: 4.6 mEq/L (ref 3.5–5.1)
Sodium: 140 mEq/L (ref 135–145)
Total Bilirubin: 0.4 mg/dL (ref 0.2–1.2)
Total Protein: 6.6 g/dL (ref 6.0–8.3)

## 2019-07-25 LAB — LIPID PANEL
Cholesterol: 204 mg/dL — ABNORMAL HIGH (ref 0–200)
HDL: 44.3 mg/dL (ref >39.00)
LDL Cholesterol: 135 mg/dL — ABNORMAL HIGH (ref 0–99)
NonHDL: 159.64
Total CHOL/HDL Ratio: 5
Triglycerides: 121 mg/dL (ref 0.0–149.0)
VLDL: 24.2 mg/dL (ref 0.0–40.0)

## 2019-07-25 LAB — PSA: PSA: 0.77 ng/mL (ref 0.10–4.00)

## 2019-07-25 LAB — TSH: TSH: 1.31 u[IU]/mL (ref 0.35–4.50)

## 2019-07-25 LAB — HEMOGLOBIN A1C: Hgb A1c MFr Bld: 5.7 % (ref 4.6–6.5)

## 2019-07-25 NOTE — Patient Instructions (Addendum)
Great to see you today-  I will be in touch with your labs asap You might want to get the shingles vaccine- shingrix- at your convenience.  This is a 2 shot series You got your 10 year tetanus and pertussis booster today Continue to exercise and take good care of yourself!  I will set you up for a neurology consultation in [place] to discuss your dizziness    Health Maintenance, Male Adopting a healthy lifestyle and getting preventive care are important in promoting health and wellness. Ask your health care provider about:  The right schedule for you to have regular tests and exams.  Things you can do on your own to prevent diseases and keep yourself healthy. What should I know about diet, weight, and exercise? Eat a healthy diet   Eat a diet that includes plenty of vegetables, fruits, low-fat dairy products, and lean protein.  Do not eat a lot of foods that are high in solid fats, added sugars, or sodium. Maintain a healthy weight Body mass index (BMI) is a measurement that can be used to identify possible weight problems. It estimates body fat based on height and weight. Your health care provider can help determine your BMI and help you achieve or maintain a healthy weight. Get regular exercise Get regular exercise. This is one of the most important things you can do for your health. Most adults should:  Exercise for at least 150 minutes each week. The exercise should increase your heart rate and make you sweat (moderate-intensity exercise).  Do strengthening exercises at least twice a week. This is in addition to the moderate-intensity exercise.  Spend less time sitting. Even light physical activity can be beneficial. Watch cholesterol and blood lipids Have your blood tested for lipids and cholesterol at 59 years of age, then have this test every 5 years. You may need to have your cholesterol levels checked more often if:  Your lipid or cholesterol levels are high.  You are  older than 59 years of age.  You are at high risk for heart disease. What should I know about cancer screening? Many types of cancers can be detected early and may often be prevented. Depending on your health history and family history, you may need to have cancer screening at various ages. This may include screening for:  Colorectal cancer.  Prostate cancer.  Skin cancer.  Lung cancer. What should I know about heart disease, diabetes, and high blood pressure? Blood pressure and heart disease  High blood pressure causes heart disease and increases the risk of stroke. This is more likely to develop in people who have high blood pressure readings, are of African descent, or are overweight.  Talk with your health care provider about your target blood pressure readings.  Have your blood pressure checked: ? Every 3-5 years if you are 52-25 years of age. ? Every year if you are 72 years old or older.  If you are between the ages of 66 and 67 and are a current or former smoker, ask your health care provider if you should have a one-time screening for abdominal aortic aneurysm (AAA). Diabetes Have regular diabetes screenings. This checks your fasting blood sugar level. Have the screening done:  Once every three years after age 44 if you are at a normal weight and have a low risk for diabetes.  More often and at a younger age if you are overweight or have a high risk for diabetes. What should I know  about preventing infection? Hepatitis B If you have a higher risk for hepatitis B, you should be screened for this virus. Talk with your health care provider to find out if you are at risk for hepatitis B infection. Hepatitis C Blood testing is recommended for:  Everyone born from [birth year] through 1966.  Anyone with known risk factors for hepatitis C. Sexually transmitted infections (STIs)  You should be screened each year for STIs, including gonorrhea and chlamydia, if: ? You are sexually  active and are younger than 59 years of age. ? You are older than 59 years of age and your health care provider tells you that you are at risk for this type of infection. ? Your sexual activity has changed since you were last screened, and you are at increased risk for chlamydia or gonorrhea. Ask your health care provider if you are at risk.  Ask your health care provider about whether you are at high risk for HIV. Your health care provider may recommend a prescription medicine to help prevent HIV infection. If you choose to take medicine to prevent HIV, you should first get tested for HIV. You should then be tested every 3 months for as long as you are taking the medicine. Follow these instructions at home: Lifestyle  Do not use any products that contain nicotine or tobacco, such as cigarettes, e-cigarettes, and chewing tobacco. If you need help quitting, ask your health care provider.  Do not use street drugs.  Do not share needles.  Ask your health care provider for help if you need support or information about quitting drugs. Alcohol use  Do not drink alcohol if your health care provider tells you not to drink.  If you drink alcohol: ? Limit how much you have to 0-2 drinks a day. ? Be aware of how much alcohol is in your drink. In the [place] one drink equals one 12 oz bottle of beer (355 mL), one 5 oz glass of wine (148 mL), or one 1 oz glass of hard liquor (44 mL). General instructions  Schedule regular health, dental, and eye exams.  Stay current with your vaccines.  Tell your health care provider if: ? You often feel depressed. ? You have ever been abused or do not feel safe at home. Summary  Adopting a healthy lifestyle and getting preventive care are important in promoting health and wellness.  Follow your health care provider's instructions about healthy diet, exercising, and getting tested or screened for diseases.  Follow your health care provider's instructions on  monitoring your cholesterol and blood pressure. This information is not intended to replace advice given to you by your health care provider. Make sure you discuss any questions you have with your health care provider. Document Released: 05/29/2008 Document Revised: 11/24/2018 Document Reviewed: 11/24/2018 Elsevier Patient Education  2020 [employer].

## 2019-07-26 MED ORDER — PRAVASTATIN SODIUM 20 MG PO TABS: 20.0000 mg | ORAL_TABLET | Freq: Every day | ORAL | 3 refills | Status: DC

## 2019-09-20 ENCOUNTER — Other Ambulatory Visit: Payer: Self-pay | Admitting: *Deleted

## 2019-09-20 DIAGNOSIS — M4306 Spondylolysis, lumbar region: Secondary | ICD-10-CM

## 2019-09-20 MED ORDER — CELECOXIB 200 MG PO CAPS: 200.0000 mg | ORAL_CAPSULE | Freq: Every day | ORAL | 3 refills | Status: DC

## 2019-09-24 ENCOUNTER — Encounter: Payer: Self-pay | Admitting: Family Medicine

## 2019-10-18 ENCOUNTER — Other Ambulatory Visit: Payer: Self-pay

## 2019-10-18 DIAGNOSIS — Z20822 Contact with and (suspected) exposure to covid-19: Secondary | ICD-10-CM

## 2019-10-19 ENCOUNTER — Telehealth: Payer: 59 | Admitting: Family

## 2019-10-19 DIAGNOSIS — Z20822 Contact with and (suspected) exposure to covid-19: Secondary | ICD-10-CM

## 2019-10-19 DIAGNOSIS — Z20828 Contact with and (suspected) exposure to other viral communicable diseases: Secondary | ICD-10-CM

## 2019-10-19 LAB — NOVEL CORONAVIRUS, NAA: SARS-CoV-2, NAA: NOT DETECTED

## 2019-10-19 MED ORDER — PROMETHAZINE-DM 6.25-15 MG/5ML PO SYRP: 5.0000 mL | ORAL_SOLUTION | Freq: Three times a day (TID) | ORAL | 0 refills | Status: DC | PRN

## 2019-10-19 MED ORDER — BENZONATATE 100 MG PO CAPS: 100.0000 mg | ORAL_CAPSULE | Freq: Three times a day (TID) | ORAL | 0 refills | Status: DC | PRN

## 2019-10-19 MED FILL — PROMETHAZINE W/DM SYRUP: 6.25-15 | 8 days supply | Qty: 118 | Fill #0

## 2019-10-19 MED FILL — BENZONATATE 100 MG CAPS: 100 | 6 days supply | Qty: 20 | Fill #0

## 2019-10-19 NOTE — Progress Notes (Signed)
E-Visit for Corona Virus Screening   Your current symptoms could be consistent with the coronavirus.  Many health care providers can now test patients at their office but not all are.  [facility] has multiple testing sites. For information on our COVID testing locations and hours go to [email or web address]  Please quarantine yourself while awaiting your test results.  We are enrolling you in our [place] for [place] . Daily you will receive a questionnaire within the [facility] website. Our COVID 19 response team willl be monitoriing your responses daily.  You can go to one of the testing sites listed below, while they are opened (see hours). You do not need an order and will stay in your car during the test. You do need to self isolate until your results return and if positive 10 days from when your symptoms started and until you are 3 days fever free.   Testing Locations (Monday - Friday, 8 a.m. - 3:30 p.m.) . [place]: [facility] at [facility], [address], [place], [place]: [place], [place], [place], [place] (entrance [address])  . [facility]: (Closed each Monday): Testing site relocated to the short stay covered drive at [facility]. (Use the [facility] entrance to [facility] next to [place] is a respiratory illness with symptoms that are similar to the flu. Symptoms are typically mild to moderate, but there have been cases of severe illness and death due to the virus. The following symptoms may appear 2-14 days after exposure: . Fever . Cough . Shortness of breath or difficulty breathing . Chills . Repeated shaking with chills . Muscle pain . Headache . Sore throat . New loss of taste or smell . Fatigue . Congestion or runny nose . Nausea or vomiting . Diarrhea  It is vitally important that if you feel that you  have an infection such as this virus or any other virus that you stay home and away from places where you may spread it to others.  You should self-quarantine for 14 days if you have symptoms that could potentially be coronavirus or have been in close contact a with a person diagnosed with COVID-19 within the last 2 weeks. You should avoid contact with people age 67 and older.   You should wear a mask or cloth face covering over your nose and mouth if you must be around other people or animals, including pets (even at home). Try to stay at least 6 feet away from other people. This will protect the people around you.  You can use medication such as A prescription cough medication called Tessalon Perles 100 mg. You may take 1-2 capsules every 8 hours as needed for cough.  You may also take acetaminophen (Tylenol) as needed for fever.   Reduce your risk of any infection by using the same precautions used for avoiding the common cold or flu:  [name] Wash your hands often with soap and warm water for at least 20 seconds.  If soap and water are not readily available, use an alcohol-based hand sanitizer with at least 60% alcohol.  . If coughing or sneezing, cover your mouth and nose by coughing or sneezing into the elbow areas of your shirt or coat, into a tissue or into your sleeve (not your hands). . Avoid shaking hands with others and consider head nods or verbal greetings only. . Avoid touching your  eyes, nose, or mouth with unwashed hands.  . Avoid close contact with people who are sick. . Avoid places or events with large numbers of people in one location, like concerts or sporting events. . Carefully consider travel plans you have or are making. . If you are planning any travel outside or inside the [place], visit the CDC's Travelers' Health webpage for the latest health notices. . If you have some symptoms but not all symptoms, continue to monitor at home and seek medical attention if your symptoms  worsen. . If you are having a medical emergency, call 911.  HOME CARE . Only take medications as instructed by your medical team. . Drink plenty of fluids and get plenty of rest. . A steam or ultrasonic humidifier can help if you have congestion.   GET HELP RIGHT AWAY IF YOU HAVE EMERGENCY WARNING SIGNS** FOR COVID-19. If you or someone is showing any of these signs seek emergency medical care immediately. Call 911 or proceed to your closest emergency facility if: . You develop worsening high fever. . Trouble breathing . Bluish lips or face . Persistent pain or pressure in the chest . New confusion . Inability to wake or stay awake . You cough up blood. . Your symptoms become more severe  **This list is not all possible symptoms. Contact your medical provider for any symptoms that are sever or concerning to you.   MAKE SURE YOU   Understand these instructions.  Will watch your condition.  Will get help right away if you are not doing well or get worse.  Your e-visit answers were reviewed by a board certified advanced clinical practitioner to complete your personal care plan.  Depending on the condition, your plan could have included both over the counter or prescription medications.  If there is a problem please reply once you have received a response from your provider.  Your safety is important to [place].  If you have drug allergies check your prescription carefully.    You can use MyChart to ask questions about today's visit, request a non-urgent call back, or ask for a work or school excuse for 24 hours related to this e-Visit. If it has been greater than 24 hours you will need to follow up with your provider, or enter a new e-Visit to address those concerns. You will get an e-mail in the next two days asking about your experience.  I hope that your e-visit has been valuable and will speed your recovery. Thank you for using e-visits.   'Approximately 5 minutes was spent  documenting and reviewing patient's chart.

## 2019-10-19 NOTE — Addendum Note (Signed)
Addended by: Evelina Dun A on: 10/19/2019 11:17 AM   Modules accepted: Orders

## 2019-11-03 ENCOUNTER — Encounter: Payer: Self-pay | Admitting: Family Medicine

## 2019-11-03 ENCOUNTER — Ambulatory Visit (INDEPENDENT_AMBULATORY_CARE_PROVIDER_SITE_OTHER): Payer: 59 | Admitting: Family Medicine

## 2019-11-03 ENCOUNTER — Other Ambulatory Visit: Payer: Self-pay

## 2019-11-03 DIAGNOSIS — R05 Cough: Secondary | ICD-10-CM | POA: Diagnosis not present

## 2019-11-03 DIAGNOSIS — R059 Cough, unspecified: Secondary | ICD-10-CM

## 2019-11-03 MED ORDER — DOXYCYCLINE HYCLATE 100 MG PO CAPS: 100.0000 mg | ORAL_CAPSULE | Freq: Two times a day (BID) | ORAL | 0 refills | Status: DC

## 2019-11-03 MED FILL — DOXYCYCLINE HYCLATE 100 MG: 100 | 10 days supply | Qty: 20 | Fill #0

## 2019-11-03 NOTE — Progress Notes (Signed)
[place] at [facility] [address], [place], [place] [address] [id number] [phone]  Date:  11/03/2019   Name:  Curtis Luna   DOB:  06-Sep-1961   MRN:  [id number]  PCP:  Darreld Mclean, MD    Chief Complaint: No chief complaint on file.   History of Present Illness:  Curtis Luna is a 58 y.o. very pleasant male patient who presents with the following:  Virtual visit today for this gentleman who is my primary care patient History of reflex, hyperlipidemia, neuropathy, diverticulitis status post sigmoid colectomy Last seen by myself for a physical in August of this year  Patient location is home, provider is at office Patient identity confirmed with 2 factors, he gives consent for virtual visit today  He has noted 2.5- 3 weeks of illness He notes a st, runny and stuffy nose, cold like sx  Sx started around 10/18/2019 He had a negative covid test the same day He did a virtual visit the next day andgot some tessalon perles and cough syrup He lives with his wife, she has had some milder symptoms but has not been as sick as he is  He started to feel better, but then he notes a worsening cough the last several days, along with some sinus pressure No fever The cough feels like a tickle in his throat   He may cough up some clear mucus He is blowing some discolored mucus from his nose   He does not feel terrible, but is just a bit concerned that his symptoms are not clearing up, he also wonders if he should be retested for COVID-19  No GI symptoms   Patient Active Problem List   Diagnosis Date Noted  . Toe pain, right 08/23/2018  . Pain of left heel 06/27/2018  . Multiple thyroid nodules 11/05/2015  . Peripheral neuropathy 04/18/2015  . Vertigo 04/15/2015  . FH: brain aneurysm 04/15/2015  . 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 Seasonal affective disorder ([place]) 10/02/2014  . Internal hemorrhoid 04/20/2014  .  Chronic low back pain 04/20/2014  . Anxiety 04/20/2014  . Esophageal reflux 04/04/2011  . Diverticulosis of colon (without mention of hemorrhage) 04/04/2011    Past Medical History:  Diagnosis Date  . Abdominal pain, right lower quadrant   . Acute bronchospasm   . Allergy   . Anxiety    on and off  . Backache, unspecified   . Chicken pox   . Diverticulosis of colon (without mention of hemorrhage)   . Esophageal reflux   . External hemorrhoids without mention of complication    thrombosed  . Hiatal hernia   . Hyperlipidemia    no medicines   . Inguinal hernia without mention of obstruction or gangrene, unilateral or unspecified, (not specified as recurrent)   . Migraine   . Obstructive sleep apnea (adult) (pediatric)   . Other symptoms involving digestive system(787.99)   . Pain in joint, forearm   . Pain in joint, shoulder region   . Seasonal allergies   . Sleep apnea    no cpap but uses dental device   . Thoracic or lumbosacral neuritis or radiculitis, unspecified     Past Surgical History:  Procedure Laterality Date  . COLON RESECTION  10/06   sigmoid colon  . COLONOSCOPY    . INGUINAL HERNIA REPAIR  12/2012   Left  . UPPER GASTROINTESTINAL ENDOSCOPY    . WISDOM TOOTH  EXTRACTION      Social History   Tobacco Use  . Smoking status: Former Smoker    Types: Cigarettes, Cigars    Quit date: 10/04/1987    Years since quitting: 32.1  . Smokeless tobacco: Never Used  Substance Use Topics  . Alcohol use: Yes    Alcohol/week: 7.0 - 14.0 standard drinks    Types: 7 - 14 Standard drinks or equivalent per week    Comment: 2 daily   . Drug use: No    Family History  Problem Relation Age of Onset  . Other Father        MVA  . Lung cancer Mother 46       Deceased  . Diabetes Maternal Aunt   . Heart disease Paternal Grandmother   . Heart attack Paternal Grandmother   . Healthy Brother        x2  . Aneurysm Sister        Brain  . Allergies Daughter         x2  . Migraines Daughter        x1  . Colon cancer Neg Hx   . Colon polyps Neg Hx   . Esophageal cancer Neg Hx   . Rectal cancer Neg Hx   . Stomach cancer Neg Hx   . Pancreatic cancer Neg Hx     Allergies  Allergen Reactions  . Escitalopram Hives    Pt felt anxious  Pt felt his skin was "on fire"  . Protonix [Pantoprazole Sodium] Other (See Comments)    Tingling of extremities    Medication list has been reviewed and updated.  Current Outpatient Medications on File Prior to Visit  Medication Sig Dispense Refill  . b complex vitamins tablet Take 1 tablet by mouth daily. Every other day    . benzonatate (TESSALON PERLES) 100 MG capsule Take 1 capsule (100 mg total) by mouth 3 (three) times daily as needed. 20 capsule 0  . celecoxib (CELEBREX) 200 MG capsule Take 1 capsule (200 mg total) by mouth daily. Use as needed for pain 90 capsule 3  . cholecalciferol (VITAMIN D3) 25 MCG (1000 UT) tablet Take 1,000 Units by mouth daily.    [name] esomeprazole (NEXIUM) 40 MG capsule Take 1 capsule (40 mg total) by mouth daily at 12 noon. 90 capsule 3  . gabapentin (NEURONTIN) 100 MG capsule Take 1 capsule (100 mg total) by mouth 3 (three) times daily. 270 capsule 3  . LORazepam (ATIVAN) 1 MG tablet Take 1 tablet (1 mg total) by mouth as needed for anxiety. May take 1 per day 30 tablet 0  . Multiple Vitamin (MULTIVITAMIN) tablet Take 1 tablet by mouth daily. Men's 50 plus multivitamin daily    . pravastatin (PRAVACHOL) 20 MG tablet Take 1 tablet (20 mg total) by mouth daily. 90 tablet 3  . promethazine-dextromethorphan (PROMETHAZINE-DM) 6.25-15 MG/5ML syrup Take 5 mLs by mouth 3 (three) times daily as needed for cough. 118 mL 0   Current Facility-Administered Medications on File Prior to Visit  Medication Dose Route Frequency Provider Last Rate Last Dose  . 0.9 %  sodium chloride infusion  500 mL Intravenous Once Irene Shipper, MD        Review of Systems:  As per HPI- otherwise  negative.   Physical Examination: There were no vitals filed for this visit. There were no vitals filed for this visit. There is no height or weight on file to calculate BMI. Ideal Body Weight:  Pt observed over video-he looks well, no cough, wheezing, shortness of breath is observed He is checking his temp - it is normal  Not checking other vitals No fevers noted   Assessment and Plan: Cough - Plan: Novel Coronavirus, NAA ([facility]), doxycycline (VIBRAMYCIN) 100 MG capsule  Virtual visit today to discuss persistent cough for 2 to 3 weeks.  Patient had a negative COVID-19 test at the very start of his illness.  However, I suggested that he be retested as his symptoms have lasted for quite some time.  He is in agreement and plans to be tested tomorrow.  I will also start him on doxycycline for potential bacterial bronchitis/sinusitis.  He is self isolating, will let me know if his symptoms do not improve or if he starts to get worse.  We will touch base pending his COVID-19 result  Signed Lamar Blinks, MD

## 2019-11-04 ENCOUNTER — Other Ambulatory Visit: Payer: Self-pay

## 2019-11-04 DIAGNOSIS — Z20822 Contact with and (suspected) exposure to covid-19: Secondary | ICD-10-CM

## 2019-11-07 LAB — NOVEL CORONAVIRUS, NAA: SARS-CoV-2, NAA: NOT DETECTED

## 2019-11-08 ENCOUNTER — Encounter: Payer: Self-pay | Admitting: Family Medicine

## 2019-11-09 ENCOUNTER — Encounter: Payer: Self-pay | Admitting: Family Medicine

## 2019-11-21 ENCOUNTER — Encounter: Payer: Self-pay | Admitting: Family Medicine

## 2019-11-21 ENCOUNTER — Ambulatory Visit (HOSPITAL_BASED_OUTPATIENT_CLINIC_OR_DEPARTMENT_OTHER)
Admission: RE | Admit: 2019-11-21 | Discharge: 2019-11-21 | Disposition: A | Discharge status: Home / Self Care | Payer: 59 | Source: Ambulatory Visit | Attending: Family Medicine | Admitting: Family Medicine

## 2019-11-21 ENCOUNTER — Ambulatory Visit (INDEPENDENT_AMBULATORY_CARE_PROVIDER_SITE_OTHER): Payer: 59 | Admitting: Family Medicine

## 2019-11-21 ENCOUNTER — Other Ambulatory Visit: Payer: Self-pay

## 2019-11-21 DIAGNOSIS — R05 Cough: Secondary | ICD-10-CM

## 2019-11-21 DIAGNOSIS — R042 Hemoptysis: Secondary | ICD-10-CM

## 2019-11-21 DIAGNOSIS — R059 Cough, unspecified: Secondary | ICD-10-CM

## 2019-11-21 MED ORDER — PREDNISONE 20 MG PO TABS: ORAL_TABLET | ORAL | 0 refills | Status: DC

## 2019-11-21 MED FILL — predniSONE 20 MG TABS: 20 | 6 days supply | Qty: 9 | Fill #0

## 2019-11-21 NOTE — Progress Notes (Signed)
[place] at [facility] [address] [place], [place] [address] [phone] [id number] [phone]  Date:  11/21/2019   Name:  Curtis Luna   DOB:  Jan 02, 1961   MRN:  [id number]  PCP:  Darreld Mclean, MD    Chief Complaint: No chief complaint on file.   History of Present Illness:  Curtis Luna is a 58 y.o. very pleasant male patient who presents with the following:  Virtual visit today for pt with history of thyroid nodules, neuropathy Pt location is home, provider is at office  Pt ID confirmed with 2 factors, he gives consent for virtual visit today  He had a negative covid test on 11/20 I saw him for a virtual visit on November 19, called in a course of doxycycline but not steroids for persistent cough He finished at this course about 1 week ago Overall he is feeling better, but is still coughing somewhat  He coughed up a little blood today after he coughed hard He noted a couple of tiny blood spots on his mask when he went to put it back on-otherwise he does not think he would have noticed the blood  No fever He has not used steroids recently His cough got better from a couple of weeks ago, but he feels congested and hot in his chest still No CP or pressure He has not noted any wheezing No chills or body aches  He did smoke but he quit in 1988 He smoked for about 10 years- about one PPD   Flu shot not done yet this year   Patient Active Problem List   Diagnosis Date Noted  . Toe pain, right 08/23/2018  . Pain of left heel 06/27/2018  . Multiple thyroid nodules 11/05/2015  . Peripheral neuropathy 04/18/2015  . Vertigo 04/15/2015  . FH: brain aneurysm 04/15/2015  . 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 Seasonal affective disorder ([place]) 10/02/2014  . Internal hemorrhoid 04/20/2014  . Chronic low back pain 04/20/2014  . Anxiety 04/20/2014  . Esophageal reflux 04/04/2011  . Diverticulosis of colon (without mention  of hemorrhage) 04/04/2011    Past Medical History:  Diagnosis Date  . Abdominal pain, right lower quadrant   . Acute bronchospasm   . Allergy   . Anxiety    on and off  . Backache, unspecified   . Chicken pox   . Diverticulosis of colon (without mention of hemorrhage)   . Esophageal reflux   . External hemorrhoids without mention of complication    thrombosed  . Hiatal hernia   . Hyperlipidemia    no medicines   . Inguinal hernia without mention of obstruction or gangrene, unilateral or unspecified, (not specified as recurrent)   . Migraine   . Obstructive sleep apnea (adult) (pediatric)   . Other symptoms involving digestive system(787.99)   . Pain in joint, forearm   . Pain in joint, shoulder region   . Seasonal allergies   . Sleep apnea    no cpap but uses dental device   . Thoracic or lumbosacral neuritis or radiculitis, unspecified     Past Surgical History:  Procedure Laterality Date  . COLON RESECTION  10/06   sigmoid colon  . COLONOSCOPY    . INGUINAL HERNIA REPAIR  12/2012   Left  . UPPER GASTROINTESTINAL ENDOSCOPY    . WISDOM TOOTH EXTRACTION      Social History   Tobacco Use  . Smoking  status: Former Smoker    Types: Cigarettes, Cigars    Quit date: 10/04/1987    Years since quitting: 32.1  . Smokeless tobacco: Never Used  Substance Use Topics  . Alcohol use: Yes    Alcohol/week: 7.0 - 14.0 standard drinks    Types: 7 - 14 Standard drinks or equivalent per week    Comment: 2 daily   . Drug use: No    Family History  Problem Relation Age of Onset  . Other Father        MVA  . Lung cancer Mother 68       Deceased  . Diabetes Maternal Aunt   . Heart disease Paternal Grandmother   . Heart attack Paternal Grandmother   . Healthy Brother        x2  . Aneurysm Sister        Brain  . Allergies Daughter        x2  . Migraines Daughter        x1  . Colon cancer Neg Hx   . Colon polyps Neg Hx   . Esophageal cancer Neg Hx   . Rectal cancer  Neg Hx   . Stomach cancer Neg Hx   . Pancreatic cancer Neg Hx     Allergies  Allergen Reactions  . Escitalopram Hives    Pt felt anxious  Pt felt his skin was "on fire"  . Protonix [Pantoprazole Sodium] Other (See Comments)    Tingling of extremities    Medication list has been reviewed and updated.  Current Outpatient Medications on File Prior to Visit  Medication Sig Dispense Refill  . b complex vitamins tablet Take 1 tablet by mouth daily. Every other day    . benzonatate (TESSALON PERLES) 100 MG capsule Take 1 capsule (100 mg total) by mouth 3 (three) times daily as needed. 20 capsule 0  . celecoxib (CELEBREX) 200 MG capsule Take 1 capsule (200 mg total) by mouth daily. Use as needed for pain 90 capsule 3  . cholecalciferol (VITAMIN D3) 25 MCG (1000 UT) tablet Take 1,000 Units by mouth daily.    [name] doxycycline (VIBRAMYCIN) 100 MG capsule Take 1 capsule (100 mg total) by mouth 2 (two) times daily. 20 capsule 0  . esomeprazole (NEXIUM) 40 MG capsule Take 1 capsule (40 mg total) by mouth daily at 12 noon. 90 capsule 3  . gabapentin (NEURONTIN) 100 MG capsule Take 1 capsule (100 mg total) by mouth 3 (three) times daily. 270 capsule 3  . LORazepam (ATIVAN) 1 MG tablet Take 1 tablet (1 mg total) by mouth as needed for anxiety. May take 1 per day 30 tablet 0  . Multiple Vitamin (MULTIVITAMIN) tablet Take 1 tablet by mouth daily. Men's 50 plus multivitamin daily    . pravastatin (PRAVACHOL) 20 MG tablet Take 1 tablet (20 mg total) by mouth daily. 90 tablet 3  . promethazine-dextromethorphan (PROMETHAZINE-DM) 6.25-15 MG/5ML syrup Take 5 mLs by mouth 3 (three) times daily as needed for cough. 118 mL 0   Current Facility-Administered Medications on File Prior to Visit  Medication Dose Route Frequency Provider Last Rate Last Dose  . 0.9 %  sodium chloride infusion  500 mL Intravenous Once Irene Shipper, MD        Review of Systems:  As per HPI- otherwise negative. Otherwise feeling  well  Physical Examination: There were no vitals filed for this visit. There were no vitals filed for this visit. There is no height or  weight on file to calculate BMI. Ideal Body Weight:    Patient observed over video monitor.  He looks well, no cough, wheezing, distress is noted  Assessment and Plan: Hemoptysis - Plan: DG Chest 2 View, predniSONE (DELTASONE) 20 MG tablet  Recent episode of bronchitis treated with doxycycline.  He is overall feeling better, but continues to cough.  He also noted a trace amount of blood on his face mask today, thinks he coughed this up. He is a former smoker, but quit a long time ago We will have him use a course of prednisone, and ordered an outpatient chest x-ray He will have this done at his convenience, following up pending chest film report  Signed Lamar Blinks, MD

## 2019-11-22 ENCOUNTER — Encounter: Payer: Self-pay | Admitting: Family Medicine

## 2019-11-23 ENCOUNTER — Encounter: Payer: Self-pay | Admitting: Family Medicine

## 2019-12-27 ENCOUNTER — Other Ambulatory Visit: Payer: Self-pay | Admitting: Family Medicine

## 2019-12-27 DIAGNOSIS — G629 Polyneuropathy, unspecified: Secondary | ICD-10-CM

## 2020-01-06 ENCOUNTER — Encounter: Payer: Self-pay | Admitting: Family Medicine

## 2020-01-31 ENCOUNTER — Other Ambulatory Visit: Payer: Self-pay

## 2020-01-31 NOTE — Progress Notes (Addendum)
[name] at [facility] [place], [place], [place] [address] [phone]  Date:  02/01/2020   Name:  Curtis Luna   DOB:  09-22-1961   MRN:  [id number]  PCP:  Darreld Mclean, MD    Chief Complaint: Palpitations (after eating lunch and dinner, feels like heart rate is jumping)   History of Present Illness:  Curtis Luna is a 59 y.o. very pleasant male patient who presents with the following:  Here today for a follow-up visit and to discuss palpitations  History of thyroid nodules and neuropathy, reflux We had a virtual visit in December for cough-chest x-ray at that time was reassuring  Flu vaccine- declines today  He was taking celebrex last week for shoulder pain-he is not sure if this may trigger palpitations He then noted some palpitations esp after eating lunch and dinner- after eating sx my last for about 2 hours When he lays down in bed at night he may notice it as well When he is up and active, and in the morning he does not tend to have this issue  No chest pain or tightness  No exertional symptoms  No shortness of breath  He has noted left shoulder pain- they changed out several light bulbs in the ceiling over the holidays and this seemed to trigger shoulder pain It has been persistent since December Certain positions bother him more such as reaching his arm out to the side No nighttime pain however He saw Dr. [place] Desanctis in the past, was treated with Celebrex for similar issue.  He would like to consider a shoulder injection this time  We did a palpitation eval in 2019- he did a holter, result as follows  Normal sinus rhythm with no adverse arrhythmias.  No atrial fibrillation  5 PVC's and 39 PAC's (rare). Occasionally would have symptom associated with PAC.  No pauses Echocardiogram was also normal   Patient Active Problem List   Diagnosis Date Noted  . Toe pain, right 08/23/2018  . Pain of left  heel 06/27/2018  . Multiple thyroid nodules 11/05/2015  . Peripheral neuropathy 04/18/2015  . Vertigo 04/15/2015  . FH: brain aneurysm 04/15/2015  . 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 Seasonal affective disorder ([place]) 10/02/2014  . Internal hemorrhoid 04/20/2014  . Chronic low back pain 04/20/2014  . Anxiety 04/20/2014  . Esophageal reflux 04/04/2011  . Diverticulosis of colon (without mention of hemorrhage) 04/04/2011    Past Medical History:  Diagnosis Date  . Abdominal pain, right lower quadrant   . Acute bronchospasm   . Allergy   . Anxiety    on and off  . Backache, unspecified   . Chicken pox   . Diverticulosis of colon (without mention of hemorrhage)   . Esophageal reflux   . External hemorrhoids without mention of complication    thrombosed  . Hiatal hernia   . Hyperlipidemia    no medicines   . Inguinal hernia without mention of obstruction or gangrene, unilateral or unspecified, (not specified as recurrent)   . Migraine   . Obstructive sleep apnea (adult) (pediatric)   . Other symptoms involving digestive system(787.99)   . Pain in joint, forearm   . Pain in joint, shoulder region   . Seasonal allergies   . Sleep apnea    no cpap but uses dental device   . Thoracic or lumbosacral neuritis or radiculitis, unspecified  Past Surgical History:  Procedure Laterality Date  . COLON RESECTION  10/06   sigmoid colon  . COLONOSCOPY    . INGUINAL HERNIA REPAIR  12/2012   Left  . UPPER GASTROINTESTINAL ENDOSCOPY    . WISDOM TOOTH EXTRACTION      Social History   Tobacco Use  . Smoking status: Former Smoker    Types: Cigarettes, Cigars    Quit date: 10/04/1987    Years since quitting: 32.3  . Smokeless tobacco: Never Used  Substance Use Topics  . Alcohol use: Yes    Alcohol/week: 7.0 - 14.0 standard drinks    Types: 7 - 14 Standard drinks or equivalent per week    Comment: 2 daily   . Drug use: No    Family History  Problem  Relation Age of Onset  . Other Father        MVA  . Lung cancer Mother 44       Deceased  . Diabetes Maternal Aunt   . Heart disease Paternal Grandmother   . Heart attack Paternal Grandmother   . Healthy Brother        x2  . Aneurysm Sister        Brain  . Allergies Daughter        x2  . Migraines Daughter        x1  . Colon cancer Neg Hx   . Colon polyps Neg Hx   . Esophageal cancer Neg Hx   . Rectal cancer Neg Hx   . Stomach cancer Neg Hx   . Pancreatic cancer Neg Hx     Allergies  Allergen Reactions  . Escitalopram Hives    Pt felt anxious  Pt felt his skin was "on fire"  . Protonix [Pantoprazole Sodium] Other (See Comments)    Tingling of extremities    Medication list has been reviewed and updated.  Current Outpatient Medications on File Prior to Visit  Medication Sig Dispense Refill  . b complex vitamins tablet Take 1 tablet by mouth daily. Every other day    . celecoxib (CELEBREX) 200 MG capsule Take 1 capsule (200 mg total) by mouth daily. Use as needed for pain 90 capsule 3  . cholecalciferol (VITAMIN D3) 25 MCG (1000 UT) tablet Take 1,000 Units by mouth daily.    [name] esomeprazole (NEXIUM) 40 MG capsule Take 1 capsule (40 mg total) by mouth daily at 12 noon. 90 capsule 3  . gabapentin (NEURONTIN) 100 MG capsule TAKE 1 CAPSULE BY MOUTH 3  TIMES DAILY 270 capsule 3  . LORazepam (ATIVAN) 1 MG tablet Take 1 tablet (1 mg total) by mouth as needed for anxiety. May take 1 per day 30 tablet 0  . Multiple Vitamin (MULTIVITAMIN) tablet Take 1 tablet by mouth daily. Men's 50 plus multivitamin daily    . pravastatin (PRAVACHOL) 20 MG tablet Take 1 tablet (20 mg total) by mouth daily. 90 tablet 3   Current Facility-Administered Medications on File Prior to Visit  Medication Dose Route Frequency Provider Last Rate Last Admin  . 0.9 %  sodium chloride infusion  500 mL Intravenous Once Irene Shipper, MD        Review of Systems:  As per HPI- otherwise  negative.   Physical Examination: Vitals:   02/01/20 1102  BP: 136/80  Pulse: 80  Resp: 16  Temp: (!) 97.1 F (36.2 C)  SpO2: 99%   Vitals:   02/01/20 1102  Weight: 154 lb (69.9 kg)  Height: 5\' 5"  (1.651 m)   Body mass index is 25.63 kg/m. Ideal Body Weight: Weight in (lb) to have BMI = 25: 149.9  GEN: no acute distress.  Normal weight, looks well HEENT: Atraumatic, Normocephalic.  Ears and Nose: No external deformity. CV: RRR, No M/G/R. No JVD. No thrill. No extra heart sounds.  No arrhythmias noted during physical exam PULM: CTA B, no wheezes, crackles, rhonchi. No retractions. No resp. distress. No accessory muscle use. ABD: S, NT, ND, +BS. No rebound. No HSM. EXTR: No c/c/e PSYCH: Normally interactive. Conversant.  He has tenderness over the left anterior shoulder, normal range of motion but he has significant pain with external rotation.  Internal rotation is normal.  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  Strength of the upper extremities are normal  EKG: Normal sinus rhythm.  When compared to tracing from March 2019, no significant changes noted Assessment and Plan: Palpitations - Plan: EKG 12-Lead, CBC, Basic metabolic panel, TSH, Holter monitor - 48 hour  Chronic left shoulder pain  Here today with concern of palpitations, he had this issue about 2 years ago.  At that time Holter monitor and echo were reassuring.  We will plan to have him monitor it again, hopefully using the Zio patch for convenience, to ensure he is not having atrial fib or other dangerous arrhythmia.  If he has benign early beats, the patient is comfortable with observation  Routine labs to evaluate palpitations pending as above  He has irritated a chronic left shoulder problem by changing light bulbs over the holidays.  He is a patient at [facility], will call them for an appointment Moderate medical decision  making today This visit occurred during the SARS-CoV-2 public health emergency.  Safety protocols were in place, including screening questions prior to the visit, additional usage of staff PPE, and extensive cleaning of exam room while observing appropriate contact time as indicated for disinfecting solutions.    Signed Lamar Blinks, MD  Received his labs as below, message to patient  Results for orders placed or performed in visit on 02/01/20  CBC  Result Value Ref Range   WBC 5.5 4.0 - 10.5 K/uL   RBC 4.63 4.22 - 5.81 Mil/uL   Platelets 290.0 150.0 - 400.0 K/uL   Hemoglobin 14.8 13.0 - 17.0 g/dL   HCT 43.6 39.0 - 52.0 %   MCV 94.1 78.0 - 100.0 fl   MCHC 34.0 30.0 - 36.0 g/dL   RDW 13.8 11.5 - [id number] %  Basic metabolic panel  Result Value Ref Range   Sodium 140 135 - 145 mEq/L   Potassium 4.4 3.5 - 5.1 mEq/L   Chloride 103 96 - 112 mEq/L   CO2 32 19 - 32 mEq/L   Glucose, Bld 89 70 - 99 mg/dL   BUN 15 6 - 23 mg/dL   Creatinine, Ser 0.94 0.40 - 1.50 mg/dL   GFR 81.94 >60.00 mL/min   Calcium 9.9 8.4 - 10.5 mg/dL  TSH  Result Value Ref Range   TSH 1.79 0.35 - 4.50 uIU/mL

## 2020-02-01 ENCOUNTER — Ambulatory Visit (INDEPENDENT_AMBULATORY_CARE_PROVIDER_SITE_OTHER): Payer: 59 | Admitting: Family Medicine

## 2020-02-01 ENCOUNTER — Encounter: Payer: Self-pay | Admitting: Family Medicine

## 2020-02-01 ENCOUNTER — Other Ambulatory Visit: Payer: Self-pay

## 2020-02-01 VITALS — BP 136/80 | HR 80 | Temp 97.1°F | Resp 16 | Ht 65.0 in | Wt 154.0 lb

## 2020-02-01 DIAGNOSIS — M25512 Pain in left shoulder: Secondary | ICD-10-CM | POA: Diagnosis not present

## 2020-02-01 DIAGNOSIS — R002 Palpitations: Secondary | ICD-10-CM

## 2020-02-01 DIAGNOSIS — G8929 Other chronic pain: Secondary | ICD-10-CM

## 2020-02-01 LAB — BASIC METABOLIC PANEL
BUN: 15 mg/dL (ref 6–23)
CO2: 32 mEq/L (ref 19–32)
Calcium: 9.9 mg/dL (ref 8.4–10.5)
Chloride: 103 mEq/L (ref 96–112)
Creatinine, Ser: 0.94 mg/dL (ref 0.40–1.50)
GFR: 81.94 mL/min (ref >60.00)
Glucose, Bld: 89 mg/dL (ref 70–99)
Potassium: 4.4 mEq/L (ref 3.5–5.1)
Sodium: 140 mEq/L (ref 135–145)

## 2020-02-01 LAB — CBC
HCT: 43.6 % (ref 39.0–52.0)
Hemoglobin: 14.8 g/dL (ref 13.0–17.0)
MCHC: 34 g/dL (ref 30.0–36.0)
MCV: 94.1 fl (ref 78.0–100.0)
Platelets: 290 10*3/uL (ref 150.0–400.0)
RBC: 4.63 Mil/uL (ref 4.22–5.81)
RDW: 13.8 % (ref 11.5–15.5)
WBC: 5.5 10*3/uL (ref 4.0–10.5)

## 2020-02-01 LAB — TSH: TSH: 1.79 u[IU]/mL (ref 0.35–4.50)

## 2020-02-01 NOTE — Patient Instructions (Addendum)
It was good to see you again today- I will be in touch with your labs Your EKG is normal today We will arrange for a cardiac monitor again to make sure you are not experiencing any significant arrhythmia  Please give Dr Val Riles office a call and schedule a visit to look at your shoulder  [address] [place], [place] [address] [phone]

## 2020-02-07 ENCOUNTER — Other Ambulatory Visit: Payer: Self-pay

## 2020-02-07 NOTE — Progress Notes (Signed)
[facility] [place], [place], [place] [address] [phone]  Date:  02/08/2020   Name:  Curtis Luna   DOB:  December 17, 1960   MRN:  [id number]  PCP:  Darreld Mclean, MD    Chief Complaint: Hypertension (elevated bp reading )   History of Present Illness:  Curtis Luna is a 59 y.o. very pleasant male patient who presents with the following:  Patient who was seen in the office recently with palpitations, last visit 2/17 I referred him to see cardiology-he has an appointment scheduled on March 9 He is not having chest pain or shortness of breath, EKG was normal.  His palpitations seem to occur most frequently after a meal  He is here today with concern of elevated blood pressure He saw Dr. [place] Desanctis yesterday for shoulder issue; his BP was 143/74 at his office  He is not taking any blood pressure medication at this time His palpitations are not really bothering him as much-he wonders if perhaps what he was actually feeling was high blood pressure He took a 1/2 ativan yesterday and this did seem to help him feel calm and suppress palpitations  He has not been treated for hypertension in the past  Looking back his blood pressure has generally been under good control  BP Readings from Last 3 Encounters:  02/08/20 (!) 150/85  02/01/20 136/80  07/25/19 128/70   Pulse Readings from Last 3 Encounters:  02/08/20 80  02/01/20 80  07/25/19 68    Patient Active Problem List   Diagnosis Date Noted  . Toe pain, right 08/23/2018  . Pain of left heel 06/27/2018  . Multiple thyroid nodules 11/05/2015  . Peripheral neuropathy 04/18/2015  . Vertigo 04/15/2015  . FH: brain aneurysm 04/15/2015  . 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 Seasonal affective disorder ([place]) 10/02/2014  . Internal hemorrhoid 04/20/2014  . Chronic low back pain 04/20/2014  . Anxiety 04/20/2014  . Esophageal reflux 04/04/2011  .  Diverticulosis of colon (without mention of hemorrhage) 04/04/2011    Past Medical History:  Diagnosis Date  . Abdominal pain, right lower quadrant   . Acute bronchospasm   . Allergy   . Anxiety    on and off  . Backache, unspecified   . Chicken pox   . Diverticulosis of colon (without mention of hemorrhage)   . Esophageal reflux   . External hemorrhoids without mention of complication    thrombosed  . Hiatal hernia   . Hyperlipidemia    no medicines   . Inguinal hernia without mention of obstruction or gangrene, unilateral or unspecified, (not specified as recurrent)   . Migraine   . Obstructive sleep apnea (adult) (pediatric)   . Other symptoms involving digestive system(787.99)   . Pain in joint, forearm   . Pain in joint, shoulder region   . Seasonal allergies   . Sleep apnea    no cpap but uses dental device   . Thoracic or lumbosacral neuritis or radiculitis, unspecified     Past Surgical History:  Procedure Laterality Date  . COLON RESECTION  10/06   sigmoid colon  . COLONOSCOPY    . INGUINAL HERNIA REPAIR  12/2012   Left  . UPPER GASTROINTESTINAL ENDOSCOPY    . WISDOM TOOTH EXTRACTION      Social History   Tobacco Use  . Smoking status: Former Smoker    Types: Cigarettes, Cigars  Quit date: 10/04/1987    Years since quitting: 32.3  . Smokeless tobacco: Never Used  Substance Use Topics  . Alcohol use: Yes    Alcohol/week: 7.0 - 14.0 standard drinks    Types: 7 - 14 Standard drinks or equivalent per week    Comment: 2 daily   . Drug use: No    Family History  Problem Relation Age of Onset  . Other Father        MVA  . Lung cancer Mother 23       Deceased  . Diabetes Maternal Aunt   . Heart disease Paternal Grandmother   . Heart attack Paternal Grandmother   . Healthy Brother        x2  . Aneurysm Sister        Brain  . Allergies Daughter        x2  . Migraines Daughter        x1  . Colon cancer Neg Hx   . Colon polyps Neg Hx   .  Esophageal cancer Neg Hx   . Rectal cancer Neg Hx   . Stomach cancer Neg Hx   . Pancreatic cancer Neg Hx     Allergies  Allergen Reactions  . Escitalopram Hives    Pt felt anxious  Pt felt his skin was "on fire"  . Protonix [Pantoprazole Sodium] Other (See Comments)    Tingling of extremities    Medication list has been reviewed and updated.  Current Outpatient Medications on File Prior to Visit  Medication Sig Dispense Refill  . b complex vitamins tablet Take 1 tablet by mouth daily. Every other day    . celecoxib (CELEBREX) 200 MG capsule Take 1 capsule (200 mg total) by mouth daily. Use as needed for pain 90 capsule 3  . cholecalciferol (VITAMIN D3) 25 MCG (1000 UT) tablet Take 1,000 Units by mouth daily.    [name] esomeprazole (NEXIUM) 40 MG capsule Take 1 capsule (40 mg total) by mouth daily at 12 noon. 90 capsule 3  . gabapentin (NEURONTIN) 100 MG capsule TAKE 1 CAPSULE BY MOUTH 3  TIMES DAILY 270 capsule 3  . LORazepam (ATIVAN) 1 MG tablet Take 1 tablet (1 mg total) by mouth as needed for anxiety. May take 1 per day 30 tablet 0  . Multiple Vitamin (MULTIVITAMIN) tablet Take 1 tablet by mouth daily. Men's 50 plus multivitamin daily    . pravastatin (PRAVACHOL) 20 MG tablet Take 1 tablet (20 mg total) by mouth daily. 90 tablet 3   Current Facility-Administered Medications on File Prior to Visit  Medication Dose Route Frequency Provider Last Rate Last Admin  . 0.9 %  sodium chloride infusion  500 mL Intravenous Once Irene Shipper, MD        Review of Systems:  As per HPI- otherwise negative.   Physical Examination: Vitals:   02/08/20 1444 02/08/20 1514  BP: (!) 148/80 (!) 150/85  Pulse: 80   Resp: 16   Temp: 98.5 F (36.9 C)   SpO2: 98%    Vitals:   02/08/20 1444  Weight: 150 lb (68 kg)  Height: 5\' 5"  (1.651 m)   Body mass index is 24.96 kg/m. Ideal Body Weight: Weight in (lb) to have BMI = 25: 149.9  GEN: no acute distress.  Normal weight, looks well HEENT:  Atraumatic, Normocephalic.  Ears and Nose: No external deformity. CV: RRR, No M/G/R. No JVD. No thrill. No extra heart sounds. PULM: CTA B, no wheezes,  crackles, rhonchi. No retractions. No resp. distress. No accessory muscle use. EXTR: No c/c/e PSYCH: Normally interactive. Conversant.    Assessment and Plan: Elevated BP without diagnosis of hypertension - Plan: lisinopril (ZESTRIL) 10 MG tablet  Here today with mildly elevated blood pressure, patient is concerned.  I will start him on lisinopril 10 mg daily, encouraged him to get an over-the-counter blood pressure cuff but cautioned him that excessive blood pressure checks may increase his anxiety.  Gave him blood pressure goal parameters  He will let me know if any concerns prior to his visit with cardiology in about 2 weeks This visit occurred during the SARS-CoV-2 public health emergency.  Safety protocols were in place, including screening questions prior to the visit, additional usage of staff PPE, and extensive cleaning of exam room while observing appropriate contact time as indicated for disinfecting solutions.    Signed Lamar Blinks, MD

## 2020-02-08 ENCOUNTER — Other Ambulatory Visit: Payer: Self-pay

## 2020-02-08 ENCOUNTER — Ambulatory Visit (INDEPENDENT_AMBULATORY_CARE_PROVIDER_SITE_OTHER): Payer: 59 | Admitting: Family Medicine

## 2020-02-08 ENCOUNTER — Encounter: Payer: Self-pay | Admitting: Family Medicine

## 2020-02-08 VITALS — BP 150/85 | HR 80 | Temp 98.5°F | Resp 16 | Ht 65.0 in | Wt 150.0 lb

## 2020-02-08 DIAGNOSIS — R030 Elevated blood-pressure reading, without diagnosis of hypertension: Secondary | ICD-10-CM | POA: Diagnosis not present

## 2020-02-08 MED ORDER — LISINOPRIL 10 MG PO TABS: 10.0000 mg | ORAL_TABLET | Freq: Every day | ORAL | 3 refills | Status: DC

## 2020-02-08 MED FILL — LISINOPRIL 10 MG TABS: 10 | 30 days supply | Qty: 30 | Fill #0

## 2020-02-08 NOTE — Patient Instructions (Signed)
Great to see you again today We will start you on lisinopril 10 mg once a day for your BP I would suggest purchasing an OTC BP cuff- goal is 120- 135/75-85  If your BP starts to run too low you can halve or stop the lisinopril

## 2020-02-13 ENCOUNTER — Telehealth: Payer: Self-pay | Admitting: Family Medicine

## 2020-02-13 NOTE — Telephone Encounter (Signed)
There is an order in for 48 hr holter monitor. [facility] prefers referral for cardiology consult prior. Do you want to place referral or change to a 72 hr holter at [facility] location?

## 2020-02-14 ENCOUNTER — Encounter: Payer: Self-pay | Admitting: Family Medicine

## 2020-02-14 NOTE — Telephone Encounter (Signed)
He is already seeing cardiology next week so we can cancel holter monitor

## 2020-02-21 ENCOUNTER — Other Ambulatory Visit: Payer: Self-pay

## 2020-02-21 ENCOUNTER — Encounter: Payer: Self-pay | Admitting: *Deleted

## 2020-02-21 ENCOUNTER — Ambulatory Visit (INDEPENDENT_AMBULATORY_CARE_PROVIDER_SITE_OTHER): Payer: 59 | Admitting: Internal Medicine

## 2020-02-21 ENCOUNTER — Encounter: Payer: Self-pay | Admitting: Internal Medicine

## 2020-02-21 VITALS — BP 120/74 | HR 85 | Ht 65.0 in | Wt 146.6 lb

## 2020-02-21 DIAGNOSIS — E782 Mixed hyperlipidemia: Secondary | ICD-10-CM

## 2020-02-21 DIAGNOSIS — R002 Palpitations: Secondary | ICD-10-CM

## 2020-02-21 DIAGNOSIS — R030 Elevated blood-pressure reading, without diagnosis of hypertension: Secondary | ICD-10-CM

## 2020-02-21 NOTE — Progress Notes (Signed)
Patient ID: Curtis Luna, male   DOB: 12/09/1961, 59 y.o.   MRN: [id number] Patient enrolled for [employer] to mail a 3 day ZIO XT long term holter monitor to his home.

## 2020-02-21 NOTE — Progress Notes (Signed)
Cardiology Office Note:    Date:  02/21/2020   ID:  Curtis Luna, DOB 02/22/1961, MRN [id number]  PCP:  Darreld Mclean, MD  Cardiologist:  No primary care provider on file.  Electrophysiologist:  None   Referring MD: Darreld Mclean, MD   Chief Complaint: palpitations  History of Present Illness:    Curtis Luna is a 59 y.o. male with a history of occular migraines, PACs, HLD, GERD, thyroid nodules, who presents for evaluation of palpitations.  He noticed that after taking celebrex for 3 days he experienced more significant palpitations. He has been evalulated for palpitations in the past and found to have PACs.  He notices palpitations particularly after eating and while laying in bed.  He has recently been found to have an elevated blood pressure both at his primary care doctor's office as well as at his shoulder physician.  He feels that his palpitations are hard fast pounding in his chest and he has been experiencing the symptoms for approximately 6 weeks, several times a week.  Symptoms last for approximately 8 hours at a time.  Denies excessive caffeine or alcohol intake.  Recent TSH is 1.79 normal.  No syncope related to palpitations.  Denies chest pain or shortness of breath with activity.  Normal echocardiogram at time of previous evaluation for palpitations, 03/09/2018.    Past Medical History:  Diagnosis Date  . Abdominal pain, right lower quadrant   . Acute bronchospasm   . Allergy   . Anxiety    on and off  . Backache, unspecified   . Chicken pox   . Diverticulosis of colon (without mention of hemorrhage)   . Esophageal reflux   . External hemorrhoids without mention of complication    thrombosed  . Hiatal hernia   . Hyperlipidemia    no medicines   . Inguinal hernia without mention of obstruction or gangrene, unilateral or unspecified, (not specified as recurrent)   . Migraine   . Obstructive sleep apnea (adult) (pediatric)   . Other symptoms involving  digestive system(787.99)   . Pain in joint, forearm   . Pain in joint, shoulder region   . Seasonal allergies   . Sleep apnea    no cpap but uses dental device   . Thoracic or lumbosacral neuritis or radiculitis, unspecified     Past Surgical History:  Procedure Laterality Date  . COLON RESECTION  10/06   sigmoid colon  . COLONOSCOPY    . INGUINAL HERNIA REPAIR  12/2012   Left  . UPPER GASTROINTESTINAL ENDOSCOPY    . WISDOM TOOTH EXTRACTION      Current Medications: Current Meds  Medication Sig  . b complex vitamins tablet Take 1 tablet by mouth daily. Every other day  . celecoxib (CELEBREX) 200 MG capsule Take 1 capsule (200 mg total) by mouth daily. Use as needed for pain  . cholecalciferol (VITAMIN D3) 25 MCG (1000 UT) tablet Take 1,000 Units by mouth daily.  [name] gabapentin (NEURONTIN) 100 MG capsule TAKE 1 CAPSULE BY MOUTH 3  TIMES DAILY  . lisinopril (ZESTRIL) 10 MG tablet Take 1 tablet (10 mg total) by mouth daily. (Patient not taking: Reported on 03/13/2020)  . LORazepam (ATIVAN) 1 MG tablet Take 1 tablet (1 mg total) by mouth as needed for anxiety. May take 1 per day  . Multiple Vitamin (MULTIVITAMIN) tablet Take 1 tablet by mouth daily. Men's 50 plus multivitamin daily  . pravastatin (PRAVACHOL) 20 MG tablet Take 1 tablet (  20 mg total) by mouth daily.  . [DISCONTINUED] esomeprazole (NEXIUM) 40 MG capsule Take 1 capsule (40 mg total) by mouth daily at 12 noon.   Current Facility-Administered Medications for the 02/21/20 encounter (Office Visit) with Elouise Munroe, MD  Medication  . 0.9 %  sodium chloride infusion     Allergies:   Escitalopram and Protonix [pantoprazole sodium]   Social History   Socioeconomic History  . Marital status: Married    Spouse name: Not on file  . Number of children: Not on file  . Years of education: Not on file  . Highest education level: Not on file  Occupational History  . Occupation: Technical sales engineer   Tobacco Use  . Smoking  status: Former Smoker    Types: Cigarettes, Cigars    Quit date: 10/04/1987    Years since quitting: 32.4  . Smokeless tobacco: Never Used  Substance and Sexual Activity  . Alcohol use: Yes    Alcohol/week: 7.0 - 14.0 standard drinks    Types: 7 - 14 Standard drinks or equivalent per week    Comment: 2 daily   . Drug use: No  . Sexual activity: Not on file  Other Topics Concern  . Not on file  Social History Narrative   1 caffeine drink daily  Lives with wife in a one story home.  Has 2 children.  Works in Estate manager/land agent.   Social Determinants of Health   Financial Resource Strain:   . Difficulty of Paying Living Expenses:   Food Insecurity:   . Worried About Charity fundraiser in the Last Year:   . Arboriculturist in the Last Year:   Transportation Needs:   . Film/video editor (Medical):   [name] Lack of Transportation (Non-Medical):   Physical Activity:   . Days of Exercise per Week:   . Minutes of Exercise per Session:   Stress:   . Feeling of Stress :   Social Connections:   . Frequency of Communication with Friends and Family:   . Frequency of Social Gatherings with Friends and Family:   . Attends Religious Services:   . Active Member of Clubs or Organizations:   . Attends Archivist Meetings:   [name] Marital Status:      Family History: The patient's family history includes Allergies in his daughter; Aneurysm in his sister; Diabetes in his maternal aunt; Healthy in his brother; Heart attack in his paternal grandmother; Heart disease in his paternal grandmother; Lung cancer (age of onset: 55) in his mother; Migraines in his daughter; Other in his father. There is no history of Colon cancer, Colon polyps, Esophageal cancer, Rectal cancer, Stomach cancer, or Pancreatic cancer.  ROS:   Please see the history of present illness.    All other systems reviewed and are negative.  EKGs/Labs/Other Studies Reviewed:    The following studies were reviewed  today:  EKG:  NSR, possible LAE  Recent Labs: 07/25/2019: ALT 18 02/01/2020: BUN 15; Creatinine, Ser 0.94; Hemoglobin 14.8; Platelets 290.0; Potassium 4.4; Sodium 140; TSH 1.79  Recent Lipid Panel    Component Value Date/Time   CHOL 204 (H) 07/25/2019 1010   TRIG 121.0 07/25/2019 1010   HDL 44.30 07/25/2019 1010   CHOLHDL 5 07/25/2019 1010   VLDL 24.2 07/25/2019 1010   LDLCALC 135 (H) 07/25/2019 1010   LDLDIRECT 146 (H) 10/17/2014 0930    Physical Exam:    VS:  BP 120/74   Pulse 85  Ht 5\' 5"  (1.651 m)   Wt 146 lb 9.6 oz (66.5 kg)   SpO2 98%   BMI 24.40 kg/m     Wt Readings from Last 5 Encounters:  03/13/20 150 lb (68 kg)  02/21/20 146 lb 9.6 oz (66.5 kg)  02/08/20 150 lb (68 kg)  02/01/20 154 lb (69.9 kg)  07/25/19 148 lb (67.1 kg)    Constitutional: No acute distress Eyes: sclera non-icteric, normal conjunctiva and lids ENMT: normal dentition, moist mucous membranes Cardiovascular: regular rhythm, normal rate, no murmurs. S1 and S2 normal. Radial pulses normal bilaterally. No jugular venous distention.  Respiratory: clear to auscultation bilaterally GI : normal bowel sounds, soft and nontender. No distention.   MSK: extremities warm, well perfused. No edema.  NEURO: grossly nonfocal exam, moves all extremities. PSYCH: alert and oriented x 3, normal mood and affect.   ASSESSMENT:    1. Palpitations   2. Mixed hyperlipidemia    PLAN:    Palpitations - likely PACs or PVCs but need to exclude atrial fibrillation with recurrence of symptoms. Will perform Zio monitor to evaluate symptoms, 3 days.   HLD - on pravastatin. LDL is not optimized from August 2020, consider intensification of statin therapy.   Elevated BP- recently started on lisinopril, BP appropriate at this time. Continue as tolerated.   Cherlynn Kaiser, MD [place]  [facility]    Medication Adjustments/Labs and Tests Ordered: Current medicines are reviewed at length with the patient  today.  Concerns regarding medicines are outlined above.  Orders Placed This Encounter  Procedures  . LONG TERM MONITOR (3-14 DAYS)  . EKG 12-Lead   No orders of the defined types were placed in this encounter.   Patient Instructions  Medication Instructions:  Your physician recommends that you continue on your current medications as directed. Please refer to the Current Medication list given to you today.  *If you need a refill on your cardiac medications before your next appointment, please call your pharmacy*  Lab Work: NONE If you have labs (blood work) drawn today and your tests are completely normal, you will receive your results only by: [name] MyChart Message (if you have MyChart) OR . A paper copy in the mail If you have any lab test that is abnormal or we need to change your treatment, we will call you to review the results.   Testing/Procedures: NONE  Follow-Up: At [facility], you and your health needs are our priority.  As part of our continuing mission to provide you with exceptional heart care, we have created designated Provider Care Teams.  These Care Teams include your primary Cardiologist (physician) and Advanced Practice Providers (APPs -  Physician Assistants and Nurse Practitioners) who all work together to provide you with the care you need, when you need it.  We recommend signing up for the patient portal called "MyChart".  Sign up information is provided on this After Visit Summary.  MyChart is used to connect with patients for Virtual Visits (Telemedicine).  Patients are able to view lab/test results, encounter notes, upcoming appointments, etc.  Non-urgent messages can be sent to your provider as well.   To learn more about what you can do with MyChart, go to [email or web address].    Your next appointment:   3-4 week(s)  The format for your next appointment:   Virtual Visit   Provider:   You may see DR Margaretann Loveless  or one of the following Advanced  Practice Providers on your designated Care Team:  Rosaria Ferries, PA-C  Jory Sims, DNP, ANP  Cadence Kathlen Mody, NP    Other Instructions  Bryn Gulling- Long Term Monitor Instructions   Your physician has requested you wear your ZIO patch monitor____3___days.   This is a single patch monitor.  [employer] supplies one patch monitor per enrollment.  Additional stickers are not available.   Please do not apply patch if you will be having a Nuclear Stress Test, Echocardiogram, Cardiac CT, MRI, or Chest Xray during the time frame you would be wearing the monitor. The patch cannot be worn during these tests.  You cannot remove and re-apply the ZIO XT patch monitor.   Your ZIO patch monitor will be sent USPS Priority mail from [facility] directly to your home address. The monitor may also be mailed to a PO BOX if home delivery is not available.   It may take 3-5 days to receive your monitor after you have been enrolled.   Once you have received you monitor, please review enclosed instructions.  Your monitor has already been registered assigning a specific monitor serial # to you.   Applying the monitor   Shave hair from upper left chest.   Hold abrader disc by orange tab.  Rub abrader in 40 strokes over left upper chest as indicated in your monitor instructions.   Clean area with 4 enclosed alcohol pads .  Use all pads to assure are is cleaned thoroughly.  Let dry.   Apply patch as indicated in monitor instructions.  Patch will be place under collarbone on left side of chest with arrow pointing upward.   Rub patch adhesive wings for 2 minutes.Remove white label marked "1".  Remove white label marked "2".  Rub patch adhesive wings for 2 additional minutes.   While looking in a mirror, press and release button in center of patch.  A small green light will flash 3-4 times .  This will be your only indicator the monitor has been turned on.     Do not shower for the first 24 hours.   You may shower after the first 24 hours.   Press button if you feel a symptom. You will hear a small click.  Record Date, Time and Symptom in the Patient Log Book.   When you are ready to remove patch, follow instructions on last 2 pages of Patient Log Book.  Stick patch monitor onto last page of Patient Log Book.   Place Patient Log Book in [place] box.  Use locking tab on box and tape box closed securely.  The [employer] and [employer] has [employer] on it.  Please place in mailbox as soon as possible.  Your physician should have your test results approximately 7 days after the monitor has been mailed back to [facility].   Call [facility] at [phone] if you have questions regarding your ZIO XT patch monitor.  Call them immediately if you see an orange light blinking on your monitor.   If your monitor falls off in less than 4 days contact our Monitor department at [phone].  If your monitor becomes loose or falls off after 4 days call [employer] at [phone] for suggestions on securing your monitor.

## 2020-02-21 NOTE — Patient Instructions (Signed)
Medication Instructions:  Your physician recommends that you continue on your current medications as directed. Please refer to the Current Medication list given to you today.  *If you need a refill on your cardiac medications before your next appointment, please call your pharmacy*  Lab Work: NONE If you have labs (blood work) drawn today and your tests are completely normal, you will receive your results only by: [name] MyChart Message (if you have MyChart) OR . A paper copy in the mail If you have any lab test that is abnormal or we need to change your treatment, we will call you to review the results.   Testing/Procedures: NONE  Follow-Up: At [facility], you and your health needs are our priority.  As part of our continuing mission to provide you with exceptional heart care, we have created designated Provider Care Teams.  These Care Teams include your primary Cardiologist (physician) and Advanced Practice Providers (APPs -  Physician Assistants and Nurse Practitioners) who all work together to provide you with the care you need, when you need it.  We recommend signing up for the patient portal called "MyChart".  Sign up information is provided on this After Visit Summary.  MyChart is used to connect with patients for Virtual Visits (Telemedicine).  Patients are able to view lab/test results, encounter notes, upcoming appointments, etc.  Non-urgent messages can be sent to your provider as well.   To learn more about what you can do with MyChart, go to [email or web address].    Your next appointment:   3-4 week(s)  The format for your next appointment:   Virtual Visit   Provider:   You may see DR Margaretann Loveless  or one of the following Advanced Practice Providers on your designated Care Team:    Rosaria Ferries, PA-C  Jory Sims, DNP, ANP  Cadence Kathlen Mody, NP    Other Instructions  Bryn Gulling- Long Term Monitor Instructions   Your physician has requested you wear your ZIO patch  monitor____3___days.   This is a single patch monitor.  [employer] supplies one patch monitor per enrollment.  Additional stickers are not available.   Please do not apply patch if you will be having a Nuclear Stress Test, Echocardiogram, Cardiac CT, MRI, or Chest Xray during the time frame you would be wearing the monitor. The patch cannot be worn during these tests.  You cannot remove and re-apply the ZIO XT patch monitor.   Your ZIO patch monitor will be sent USPS Priority mail from [facility] directly to your home address. The monitor may also be mailed to a PO BOX if home delivery is not available.   It may take 3-5 days to receive your monitor after you have been enrolled.   Once you have received you monitor, please review enclosed instructions.  Your monitor has already been registered assigning a specific monitor serial # to you.   Applying the monitor   Shave hair from upper left chest.   Hold abrader disc by orange tab.  Rub abrader in 40 strokes over left upper chest as indicated in your monitor instructions.   Clean area with 4 enclosed alcohol pads .  Use all pads to assure are is cleaned thoroughly.  Let dry.   Apply patch as indicated in monitor instructions.  Patch will be place under collarbone on left side of chest with arrow pointing upward.   Rub patch adhesive wings for 2 minutes.Remove white label marked "1".  Remove white label marked "2".  Rub  patch adhesive wings for 2 additional minutes.   While looking in a mirror, press and release button in center of patch.  A small green light will flash 3-4 times .  This will be your only indicator the monitor has been turned on.     Do not shower for the first 24 hours.  You may shower after the first 24 hours.   Press button if you feel a symptom. You will hear a small click.  Record Date, Time and Symptom in the Patient Log Book.   When you are ready to remove patch, follow instructions on last 2 pages of Patient  Log Book.  Stick patch monitor onto last page of Patient Log Book.   Place Patient Log Book in [place] box.  Use locking tab on box and tape box closed securely.  The [employer] has [employer] on it.  Please place in mailbox as soon as possible.  Your physician should have your test results approximately 7 days after the monitor has been mailed back to [facility].   Call [place] at [phone] if you have questions regarding your ZIO XT patch monitor.  Call them immediately if you see an orange light blinking on your monitor.   If your monitor falls off in less than 4 days contact our Monitor department at [phone].  If your monitor becomes loose or falls off after 4 days call [employer] at [phone] for suggestions on securing your monitor.

## 2020-02-27 ENCOUNTER — Telehealth: Payer: Self-pay | Admitting: Internal Medicine

## 2020-02-27 ENCOUNTER — Ambulatory Visit (INDEPENDENT_AMBULATORY_CARE_PROVIDER_SITE_OTHER): Payer: 59

## 2020-02-27 DIAGNOSIS — R002 Palpitations: Secondary | ICD-10-CM

## 2020-02-29 MED ORDER — ESOMEPRAZOLE MAGNESIUM 40 MG PO CPDR: 40.0000 mg | DELAYED_RELEASE_CAPSULE | Freq: Every day | ORAL | 1 refills | Status: DC

## 2020-02-29 NOTE — Telephone Encounter (Signed)
Nexium refilled 

## 2020-03-08 MED FILL — LISINOPRIL 10 MG TABS: 10 | 30 days supply | Qty: 30 | Fill #1

## 2020-03-13 ENCOUNTER — Telehealth: Payer: Self-pay | Admitting: *Deleted

## 2020-03-13 ENCOUNTER — Telehealth (INDEPENDENT_AMBULATORY_CARE_PROVIDER_SITE_OTHER): Payer: 59 | Admitting: Internal Medicine

## 2020-03-13 ENCOUNTER — Encounter: Payer: Self-pay | Admitting: Internal Medicine

## 2020-03-13 VITALS — BP 138/90 | HR 77 | Ht 65.0 in | Wt 150.0 lb

## 2020-03-13 DIAGNOSIS — E782 Mixed hyperlipidemia: Secondary | ICD-10-CM | POA: Diagnosis not present

## 2020-03-13 DIAGNOSIS — R002 Palpitations: Secondary | ICD-10-CM

## 2020-03-13 DIAGNOSIS — R030 Elevated blood-pressure reading, without diagnosis of hypertension: Secondary | ICD-10-CM | POA: Diagnosis not present

## 2020-03-13 MED ORDER — METOPROLOL TARTRATE 25 MG PO TABS: 25.0000 mg | ORAL_TABLET | Freq: Two times a day (BID) | ORAL | 3 refills | Status: DC

## 2020-03-13 MED FILL — METOPROLOL TARTRATE 25 MG T: 25 | 30 days supply | Qty: 60 | Fill #0

## 2020-03-13 NOTE — Patient Instructions (Addendum)
Medication Instructions:   HOLD LISINOPRIL FOR 1 WEEK   AFTER 1 WEEK , START TAKING METOPROLOL 25 MG TWICE A DAY  FOR PALPITATIONS  *If you need a refill on your cardiac medications before your next appointment, please call your pharmacy*   Lab Work: Not needed    Testing/Procedures: Not needed   Follow-Up: At [facility], you and your health needs are our priority.  As part of our continuing mission to provide you with exceptional heart care, we have created designated Provider Care Teams.  These Care Teams include your primary Cardiologist (physician) and Advanced Practice Providers (APPs -  Physician Assistants and Nurse Practitioners) who all work together to provide you with the care you need, when you need it.    Your next appointment:   3 week(s) 04/03/20  The format for your next appointment:   Virtual Visit   Provider:   Cherlynn Kaiser, MD

## 2020-03-13 NOTE — Progress Notes (Signed)
Virtual Visit via Telephone Note   This visit type was conducted due to national recommendations for restrictions regarding the COVID-19 Pandemic (e.g. social distancing) in an effort to limit this patient's exposure and mitigate transmission in our community.  Due to his co-morbid illnesses, this patient is at least at moderate risk for complications without adequate follow up.  This format is felt to be most appropriate for this patient at this time.  The patient did not have access to video technology/had technical difficulties with video requiring transitioning to audio format only (telephone).  All issues noted in this document were discussed and addressed.  No physical exam could be performed with this format.  Please refer to the patient's chart for his  consent to telehealth for [facility].   The patient was identified using 2 identifiers.  Date:  03/13/2020   ID:  Curtis Luna, DOB 1961/03/18, MRN [id number]  Patient Location: Home Provider Location: Home  PCP:  Darreld Mclean, MD  Cardiologist:  No primary care provider on file.  Electrophysiologist:  None   Evaluation Performed:  Follow-Up Visit  Chief Complaint:  F/u palpitations  History of Present Illness:    KEESHON LINEHAN is a 59 y.o. male with history of occular migraines, PACs, HLD, GERD, thyroid nodules, who presents for palpitations. Initial visit 02/21/20.   Fri-sun heart pounding after eating.  Patient message: "I wanted you to have this for our virtual visit in the morning. I took this with my new Fitbit during an episode where it felt like my heart was beating very hard around 15-20 min after eating lunch. I also took my bp at the same time and it was 145/85. I look forward to talking with you in the morning.-[place]"  BP while feeling heart pounding and a bit wobbly on feet.  99/63 105/66 99/65 Held his lisinopril after these BP readings, for last two days but has not noticed a significant change in  symptoms.  We reviewed monitor findings, no acute findings, rare ectopy consistent with symptoms.  No chest pain. No SOB.   The patient does not have symptoms concerning for COVID-19 infection (fever, chills, cough, or new shortness of breath).    Past Medical History:  Diagnosis Date  . Abdominal pain, right lower quadrant   . Acute bronchospasm   . Allergy   . Anxiety    on and off  . Backache, unspecified   . Chicken pox   . Diverticulosis of colon (without mention of hemorrhage)   . Esophageal reflux   . External hemorrhoids without mention of complication    thrombosed  . Hiatal hernia   . Hyperlipidemia    no medicines   . Inguinal hernia without mention of obstruction or gangrene, unilateral or unspecified, (not specified as recurrent)   . Migraine   . Obstructive sleep apnea (adult) (pediatric)   . Other symptoms involving digestive system(787.99)   . Pain in joint, forearm   . Pain in joint, shoulder region   . Seasonal allergies   . Sleep apnea    no cpap but uses dental device   . Thoracic or lumbosacral neuritis or radiculitis, unspecified    Past Surgical History:  Procedure Laterality Date  . COLON RESECTION  10/06   sigmoid colon  . COLONOSCOPY    . INGUINAL HERNIA REPAIR  12/2012   Left  . UPPER GASTROINTESTINAL ENDOSCOPY    . WISDOM TOOTH EXTRACTION       Current Meds  Medication Sig  . b complex vitamins tablet Take 1 tablet by mouth daily. Every other day  . celecoxib (CELEBREX) 200 MG capsule Take 1 capsule (200 mg total) by mouth daily. Use as needed for pain  . cholecalciferol (VITAMIN D3) 25 MCG (1000 UT) tablet Take 1,000 Units by mouth daily.  [name] esomeprazole (NEXIUM) 40 MG capsule Take 1 capsule (40 mg total) by mouth daily at 12 noon.  . gabapentin (NEURONTIN) 100 MG capsule TAKE 1 CAPSULE BY MOUTH 3  TIMES DAILY  . LORazepam (ATIVAN) 1 MG tablet Take 1 tablet (1 mg total) by mouth as needed for anxiety. May take 1 per day  . Multiple  Vitamin (MULTIVITAMIN) tablet Take 1 tablet by mouth daily. Men's 50 plus multivitamin daily  . pravastatin (PRAVACHOL) 20 MG tablet Take 1 tablet (20 mg total) by mouth daily.   Current Facility-Administered Medications for the 03/13/20 encounter (Telemedicine) with Elouise Munroe, MD  Medication  . 0.9 %  sodium chloride infusion     Allergies:   Escitalopram and Protonix [pantoprazole sodium]   Social History   Tobacco Use  . Smoking status: Former Smoker    Types: Cigarettes, Cigars    Quit date: 10/04/1987    Years since quitting: 32.4  . Smokeless tobacco: Never Used  Substance Use Topics  . Alcohol use: Yes    Alcohol/week: 7.0 - 14.0 standard drinks    Types: 7 - 14 Standard drinks or equivalent per week    Comment: 2 daily   . Drug use: No     Family Hx: The patient's family history includes Allergies in his daughter; Aneurysm in his sister; Diabetes in his maternal aunt; Healthy in his brother; Heart attack in his paternal grandmother; Heart disease in his paternal grandmother; Lung cancer (age of onset: 15) in his mother; Migraines in his daughter; Other in his father. There is no history of Colon cancer, Colon polyps, Esophageal cancer, Rectal cancer, Stomach cancer, or Pancreatic cancer.  ROS:   Please see the history of present illness.     All other systems reviewed and are negative.   Prior CV studies:   The following studies were reviewed today:    Labs/Other Tests and Data Reviewed:    EKG:  Monitor strips reveiwed  Recent Labs: 07/25/2019: ALT 18 02/01/2020: BUN 15; Creatinine, Ser 0.94; Hemoglobin 14.8; Platelets 290.0; Potassium 4.4; Sodium 140; TSH 1.79   Recent Lipid Panel Lab Results  Component Value Date/Time   CHOL 204 (H) 07/25/2019 10:10 AM   TRIG 121.0 07/25/2019 10:10 AM   HDL 44.30 07/25/2019 10:10 AM   CHOLHDL 5 07/25/2019 10:10 AM   LDLCALC 135 (H) 07/25/2019 10:10 AM   LDLDIRECT 146 (H) 10/17/2014 09:30 AM    Wt Readings  from Last 3 Encounters:  03/13/20 150 lb (68 kg)  02/21/20 146 lb 9.6 oz (66.5 kg)  02/08/20 150 lb (68 kg)     Objective:    Vital Signs:  BP 138/90   Pulse 77   Ht 5\' 5"  (1.651 m)   Wt 150 lb (68 kg)   BMI 24.96 kg/m    VITAL SIGNS:  reviewed GEN:  no acute distress RESPIRATORY:  normal respiratory effort, no increased work of breathing NEURO:  alert and oriented x 3, speech normal PSYCH:  normal affect   ASSESSMENT & PLAN:    Palpitations - continued, we discussed isolated PACs and PVCs. Will trial use of metoprolol tartrate 25 mg BID for symptoms.  Mixed hyperlipidemia - consider intensifying statin therapy, LDL suboptimal.    Elevated BP without diagnosis of hypertension - has had mildly symptomatic hypotension recently, he plans to hold lisinopril for 1 week then will start metoprolol. We will follow up in 3-4 weeks to monitor symptoms.   COVID-19 Education: The signs and symptoms of COVID-19 were discussed with the patient and how to seek care for testing (follow up with PCP or arrange E-visit).  The importance of social distancing was discussed today.  Time:   Today, I have spent 19 minutes with the patient with telehealth technology discussing the above problems.  Total 25 minutes of time spent on the date of this encounter.   Medication Adjustments/Labs and Tests Ordered: Current medicines are reviewed at length with the patient today.  Concerns regarding medicines are outlined above.   Tests Ordered: No orders of the defined types were placed in this encounter.   Medication Changes: Meds ordered this encounter  Medications  . metoprolol tartrate (LOPRESSOR) 25 MG tablet    Sig: Take 1 tablet (25 mg total) by mouth 2 (two) times daily.    Dispense:  60 tablet    Refill:  3    Follow Up:  3-4 weeks virtual  Signed, Elouise Munroe, MD  03/13/2020 11:30 AM    [facility]

## 2020-03-13 NOTE — Telephone Encounter (Signed)
RN spoke to patient. Instruction were given  from today's virtual visit 03/13/20 .  AVS SUMMARY has been sent by [facility] and prescription e-sent to pharmacy .   Patient verbalized understanding

## 2020-04-03 ENCOUNTER — Telehealth (INDEPENDENT_AMBULATORY_CARE_PROVIDER_SITE_OTHER): Payer: 59 | Admitting: Internal Medicine

## 2020-04-03 VITALS — BP 126/79 | HR 75 | Ht 65.0 in | Wt 147.0 lb

## 2020-04-03 DIAGNOSIS — R002 Palpitations: Secondary | ICD-10-CM

## 2020-04-03 DIAGNOSIS — Z87891 Personal history of nicotine dependence: Secondary | ICD-10-CM

## 2020-04-03 DIAGNOSIS — E782 Mixed hyperlipidemia: Secondary | ICD-10-CM | POA: Diagnosis not present

## 2020-04-03 DIAGNOSIS — R030 Elevated blood-pressure reading, without diagnosis of hypertension: Secondary | ICD-10-CM | POA: Diagnosis not present

## 2020-04-03 MED ORDER — METOPROLOL SUCCINATE ER 50 MG PO TB24: 50.0000 mg | ORAL_TABLET | Freq: Every day | ORAL | 3 refills | Status: DC

## 2020-04-03 MED ORDER — METOPROLOL TARTRATE 25 MG PO TABS: 25.0000 mg | ORAL_TABLET | ORAL | 3 refills | Status: DC | PRN

## 2020-04-03 MED FILL — METOPROLOL SUCCINATE ER 50: 50 | 30 days supply | Qty: 30 | Fill #0

## 2020-04-03 NOTE — Patient Instructions (Signed)
Medication Instructions:  Stop Metoprolol tartrate 25 mg twice a day ( may take 1 tablet as needed in the evening for palpations) Start Metoprolol Succinate 50 mg daily  *If you need a refill on your cardiac medications before your next appointment, please call your pharmacy*   Lab Work: None   Testing/Procedures: None  Follow-Up: At [facility], you and your health needs are our priority.  As part of our continuing mission to provide you with exceptional heart care, we have created designated Provider Care Teams.  These Care Teams include your primary Cardiologist (physician) and Advanced Practice Providers (APPs -  Physician Assistants and Nurse Practitioners) who all work together to provide you with the care you need, when you need it.  We recommend signing up for the patient portal called "MyChart".  Sign up information is provided on this After Visit Summary.  MyChart is used to connect with patients for Virtual Visits (Telemedicine).  Patients are able to view lab/test results, encounter notes, upcoming appointments, etc.  Non-urgent messages can be sent to your provider as well.   To learn more about what you can do with MyChart, go to [email or web address].    Your next appointment:   1 month(s)  The format for your next appointment:   Virtual Visit   Provider:   Cherlynn Kaiser, MD

## 2020-04-03 NOTE — Progress Notes (Signed)
Virtual Visit via Telephone Note   This visit type was conducted due to national recommendations for restrictions regarding the COVID-19 Pandemic (e.g. social distancing) in an effort to limit this patient's exposure and mitigate transmission in our community.  Due to his co-morbid illnesses, this patient is at least at moderate risk for complications without adequate follow up.  This format is felt to be most appropriate for this patient at this time.  The patient did not have access to video technology/had technical difficulties with video requiring transitioning to audio format only (telephone).  All issues noted in this document were discussed and addressed.  No physical exam could be performed with this format.  Please refer to the patient's chart for his  consent to telehealth for [facility].   The patient was identified using 2 identifiers.  Date:  04/03/2020   ID:  Curtis Luna, DOB 12-18-1960, MRN [id number]  Patient Location: Home Provider Location: Office  PCP:  Darreld Mclean, MD  Cardiologist:  No primary care provider on file.  Electrophysiologist:  None   Evaluation Performed:  Follow-Up Visit  Chief Complaint:  F/u palpitations  History of Present Illness:    Curtis Luna is a 59 y.o. male with ocular migraines, PACs, hyperlipidemia, GERD, thyroid nodules, who presents for follow-up.  The patient reports his blood pressures to be as follows: 133/81 143/83 116/69 After metoprolol -  113/69 115/66 110/71 124/73  Not experiencing pounding after starting metoprolol. Feels well. Denies chest pain, shortness of breath, PND, orthopnea, leg swelling. Denies syncope.  The patient does not have symptoms concerning for COVID-19 infection (fever, chills, cough, or new shortness of breath).    Past Medical History:  Diagnosis Date  . Abdominal pain, right lower quadrant   . Acute bronchospasm   . Allergy   . Anxiety    on and off  . Backache, unspecified     . Chicken pox   . Diverticulosis of colon (without mention of hemorrhage)   . Esophageal reflux   . External hemorrhoids without mention of complication    thrombosed  . Hiatal hernia   . Hyperlipidemia    no medicines   . Inguinal hernia without mention of obstruction or gangrene, unilateral or unspecified, (not specified as recurrent)   . Migraine   . Obstructive sleep apnea (adult) (pediatric)   . Other symptoms involving digestive system(787.99)   . Pain in joint, forearm   . Pain in joint, shoulder region   . Seasonal allergies   . Sleep apnea    no cpap but uses dental device   . Thoracic or lumbosacral neuritis or radiculitis, unspecified    Past Surgical History:  Procedure Laterality Date  . COLON RESECTION  10/06   sigmoid colon  . COLONOSCOPY    . INGUINAL HERNIA REPAIR  12/2012   Left  . UPPER GASTROINTESTINAL ENDOSCOPY    . WISDOM TOOTH EXTRACTION       Current Meds  Medication Sig  . b complex vitamins tablet Take 1 tablet by mouth daily. Every other day  . celecoxib (CELEBREX) 200 MG capsule Take 200 mg by mouth daily as needed.  . cholecalciferol (VITAMIN D3) 25 MCG (1000 UT) tablet Take 1,000 Units by mouth daily.  [name] esomeprazole (NEXIUM) 40 MG capsule Take 1 capsule (40 mg total) by mouth daily at 12 noon.  . gabapentin (NEURONTIN) 100 MG capsule TAKE 1 CAPSULE BY MOUTH 3  TIMES DAILY  . LORazepam (ATIVAN) 1  MG tablet Take 1 tablet (1 mg total) by mouth as needed for anxiety. May take 1 per day  . metoprolol tartrate (LOPRESSOR) 25 MG tablet Take 1 tablet (25 mg total) by mouth 2 (two) times daily.  . Multiple Vitamin (MULTIVITAMIN) tablet Take 1 tablet by mouth daily. Men's 50 plus multivitamin daily  . pravastatin (PRAVACHOL) 20 MG tablet Take 1 tablet (20 mg total) by mouth daily.   Current Facility-Administered Medications for the 04/03/20 encounter (Video Visit) with Elouise Munroe, MD  Medication  . 0.9 %  sodium chloride infusion      Allergies:   Escitalopram and Protonix [pantoprazole sodium]   Social History   Tobacco Use  . Smoking status: Former Smoker    Types: Cigarettes, Cigars    Quit date: 10/04/1987    Years since quitting: 32.5  . Smokeless tobacco: Never Used  Substance Use Topics  . Alcohol use: Yes    Alcohol/week: 7.0 - 14.0 standard drinks    Types: 7 - 14 Standard drinks or equivalent per week    Comment: 2 daily   . Drug use: No     Family Hx: The patient's family history includes Allergies in his daughter; Aneurysm in his sister; Diabetes in his maternal aunt; Healthy in his brother; Heart attack in his paternal grandmother; Heart disease in his paternal grandmother; Lung cancer (age of onset: 44) in his mother; Migraines in his daughter; Other in his father. There is no history of Colon cancer, Colon polyps, Esophageal cancer, Rectal cancer, Stomach cancer, or Pancreatic cancer.  ROS:   Please see the history of present illness.     All other systems reviewed and are negative.   Prior CV studies:   The following studies were reviewed today:    Labs/Other Tests and Data Reviewed:    EKG:  No ECG reviewed.  Recent Labs: 07/25/2019: ALT 18 02/01/2020: BUN 15; Creatinine, Ser 0.94; Hemoglobin 14.8; Platelets 290.0; Potassium 4.4; Sodium 140; TSH 1.79   Recent Lipid Panel Lab Results  Component Value Date/Time   CHOL 204 (H) 07/25/2019 10:10 AM   TRIG 121.0 07/25/2019 10:10 AM   HDL 44.30 07/25/2019 10:10 AM   CHOLHDL 5 07/25/2019 10:10 AM   LDLCALC 135 (H) 07/25/2019 10:10 AM   LDLDIRECT 146 (H) 10/17/2014 09:30 AM    Wt Readings from Last 3 Encounters:  04/03/20 147 lb (66.7 kg)  03/13/20 150 lb (68 kg)  02/21/20 146 lb 9.6 oz (66.5 kg)     Objective:    Vital Signs:  BP 126/79   Pulse 75   Ht 5\' 5"  (1.651 m)   Wt 147 lb (66.7 kg)   BMI 24.46 kg/m    VITAL SIGNS:  reviewed GEN:  no acute distress RESPIRATORY:  normal respiratory effort, no increased work of  breathing NEURO:  alert and oriented x 3, speech normal PSYCH:  normal affect   ASSESSMENT & PLAN:    Palpitations - tolerating metoprolol 25 mg Bid however has breakthrough palpitations. Thorough discussion and shared decision making. Plan to transition to meotprolol succinate 50 mg daily, and keep metoprolol tartrate 25 mg prn breakthrough palpitations. We will discuss again in 1 month and titrate dose as needed.  Mixed hyperlipidemia - repeat labs upcoming with PCP, will request those to be forwarded to [place] for review, may need statin intensification.    Elevated BP without diagnosis of hypertension - has had mildly symptomatic hypotension recently. Discontinued lisinopril and started metoprolol and tolerating well.  Conitnue to monitor, BP well controlled.  COVID-19 Education: The signs and symptoms of COVID-19 were discussed with the patient and how to seek care for testing (follow up with PCP or arrange E-visit).  The importance of social distancing was discussed today.  Time:   Today, I have spent 25 minutes with the patient with telehealth technology discussing the above problems.     Medication Adjustments/Labs and Tests Ordered: Current medicines are reviewed at length with the patient today.  Concerns regarding medicines are outlined above.   Tests Ordered: No orders of the defined types were placed in this encounter.   Medication Changes: No orders of the defined types were placed in this encounter.     Signed, Elouise Munroe, MD  04/03/2020 9:04 AM    [place]

## 2020-04-05 MED FILL — METOPROLOL TARTRATE 25 MG T: 25 | 30 days supply | Qty: 30 | Fill #0

## 2020-04-09 ENCOUNTER — Telehealth: Payer: 59 | Admitting: Internal Medicine

## 2020-04-18 NOTE — Patient Instructions (Addendum)
It was great to see you today, I will be in touch with your labs asap I will complete and fax in your paper for you!   Please try decreasing your metoprolol to 12.5 mg twice a day and see how your BP/ pulse responds.   Please let me know if your cardiologist has any ideas about your increase in palpitations after a heavier meal.  I am glad to get you set up to see Gi or pulmonary  We will set up an ultrasound of your neck/ thyroid to check on enlargement noted today    Health Maintenance, Male Adopting a healthy lifestyle and getting preventive care are important in promoting health and wellness. Ask your health care provider about:  The right schedule for you to have regular tests and exams.  Things you can do on your own to prevent diseases and keep yourself healthy. What should I know about diet, weight, and exercise? Eat a healthy diet   Eat a diet that includes plenty of vegetables, fruits, low-fat dairy products, and lean protein.  Do not eat a lot of foods that are high in solid fats, added sugars, or sodium. Maintain a healthy weight Body mass index (BMI) is a measurement that can be used to identify possible weight problems. It estimates body fat based on height and weight. Your health care provider can help determine your BMI and help you achieve or maintain a healthy weight. Get regular exercise Get regular exercise. This is one of the most important things you can do for your health. Most adults should:  Exercise for at least 150 minutes each week. The exercise should increase your heart rate and make you sweat (moderate-intensity exercise).  Do strengthening exercises at least twice a week. This is in addition to the moderate-intensity exercise.  Spend less time sitting. Even light physical activity can be beneficial. Watch cholesterol and blood lipids Have your blood tested for lipids and cholesterol at 60 years of age, then have this test every 5 years. You may need  to have your cholesterol levels checked more often if:  Your lipid or cholesterol levels are high.  You are older than 60 years of age.  You are at high risk for heart disease. What should I know about cancer screening? Many types of cancers can be detected early and may often be prevented. Depending on your health history and family history, you may need to have cancer screening at various ages. This may include screening for:  Colorectal cancer.  Prostate cancer.  Skin cancer.  Lung cancer. What should I know about heart disease, diabetes, and high blood pressure? Blood pressure and heart disease  High blood pressure causes heart disease and increases the risk of stroke. This is more likely to develop in people who have high blood pressure readings, are of African descent, or are overweight.  Talk with your health care provider about your target blood pressure readings.  Have your blood pressure checked: ? Every 3-5 years if you are 43-64 years of age. ? Every year if you are 63 years old or older.  If you are between the ages of 32 and 30 and are a current or former smoker, ask your health care provider if you should have a one-time screening for abdominal aortic aneurysm (AAA). Diabetes Have regular diabetes screenings. This checks your fasting blood sugar level. Have the screening done:  Once every three years after age 91 if you are at a normal weight and have  a low risk for diabetes.  More often and at a younger age if you are overweight or have a high risk for diabetes. What should I know about preventing infection? Hepatitis B If you have a higher risk for hepatitis B, you should be screened for this virus. Talk with your health care provider to find out if you are at risk for hepatitis B infection. Hepatitis C Blood testing is recommended for:  Everyone born from [birth year] through 1966.  Anyone with known risk factors for hepatitis C. Sexually transmitted infections  (STIs)  You should be screened each year for STIs, including gonorrhea and chlamydia, if: ? You are sexually active and are younger than 60 years of age. ? You are older than 60 years of age and your health care provider tells you that you are at risk for this type of infection. ? Your sexual activity has changed since you were last screened, and you are at increased risk for chlamydia or gonorrhea. Ask your health care provider if you are at risk.  Ask your health care provider about whether you are at high risk for HIV. Your health care provider may recommend a prescription medicine to help prevent HIV infection. If you choose to take medicine to prevent HIV, you should first get tested for HIV. You should then be tested every 3 months for as long as you are taking the medicine. Follow these instructions at home: Lifestyle  Do not use any products that contain nicotine or tobacco, such as cigarettes, e-cigarettes, and chewing tobacco. If you need help quitting, ask your health care provider.  Do not use street drugs.  Do not share needles.  Ask your health care provider for help if you need support or information about quitting drugs. Alcohol use  Do not drink alcohol if your health care provider tells you not to drink.  If you drink alcohol: ? Limit how much you have to 0-2 drinks a day. ? Be aware of how much alcohol is in your drink. In the [place] one drink equals one 12 oz bottle of beer (355 mL), one 5 oz glass of wine (148 mL), or one 1 oz glass of hard liquor (44 mL). General instructions  Schedule regular health, dental, and eye exams.  Stay current with your vaccines.  Tell your health care provider if: ? You often feel depressed. ? You have ever been abused or do not feel safe at home. Summary  Adopting a healthy lifestyle and getting preventive care are important in promoting health and wellness.  Follow your health care provider's instructions about healthy diet,  exercising, and getting tested or screened for diseases.  Follow your health care provider's instructions on monitoring your cholesterol and blood pressure. This information is not intended to replace advice given to you by your health care provider. Make sure you discuss any questions you have with your health care provider. Document Revised: 11/24/2018 Document Reviewed: 11/24/2018 Elsevier Patient Education  2020 [employer].

## 2020-04-18 NOTE — Progress Notes (Addendum)
[place] at [facility] [address] [place], [place] [address] [phone]  Date:  04/19/2020   Name:  Curtis Luna   DOB:  1961-05-07   MRN:  [id number]  PCP:  Darreld Mclean, MD    Chief Complaint: Annual Exam (declines shingrix)   History of Present Illness:  Curtis Luna is a 59 y.o. very pleasant male patient who presents with the following:  Here today for annual CPE History of thyroid nodules, SAD, ED Last seen by myself in February with concern of elevated BP and palpitations- we started him on lisinopril 10 mg at that time  He saw cardiology in March- they started him on a BB, metoprolol He is currently taking twice a day Lopressor.  They tried him on Toprol-XL, but this seemed to cause excessively low blood pressure and pulse Today he notes that his palpitations seem to be worse after he eats.  A heavier meal makes him worse -a later meal sometimes causes no symptoms  covid vaccine- not done yet. I encouraged him to get this asap Colonoscopy UTD but due next year-  He had an upper GI last year which was normal  Shingirx:  Declines today Some labs done in February but needs A1c, lipids, psa  He did smoke years ago- could use AAA screening at age 67  He smoked about 10 years, about 1 PPD -does not qualify for lung cancer screening  He is using nexium since zantac was pulled off the market This seems to work ok for him  His BP at home has been 100/60s generally, pulse in the upper 50s He has not felt pre-syncopal since he went off toprol xl  He has 2 daughters and 3 grands  He has been exercising- walking 20 minutes most days of the week with his dog  No exertional chest pain or shortness of breath Patient Active Problem List   Diagnosis Date Noted  . Toe pain, right 08/23/2018  . Pain of left heel 06/27/2018  . Multiple thyroid nodules 11/05/2015  . Peripheral neuropathy 04/18/2015  . Vertigo 04/15/2015   . FH: brain aneurysm 04/15/2015  . 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 Seasonal affective disorder ([place]) 10/02/2014  . Internal hemorrhoid 04/20/2014  . Chronic low back pain 04/20/2014  . Anxiety 04/20/2014  . Esophageal reflux 04/04/2011  . Diverticulosis of colon (without mention of hemorrhage) 04/04/2011    Past Medical History:  Diagnosis Date  . Abdominal pain, right lower quadrant   . Acute bronchospasm   . Allergy   . Anxiety    on and off  . Backache, unspecified   . Chicken pox   . Diverticulosis of colon (without mention of hemorrhage)   . Esophageal reflux   . External hemorrhoids without mention of complication    thrombosed  . Hiatal hernia   . Hyperlipidemia    no medicines   . Inguinal hernia without mention of obstruction or gangrene, unilateral or unspecified, (not specified as recurrent)   . Migraine   . Obstructive sleep apnea (adult) (pediatric)   . Other symptoms involving digestive system(787.99)   . Pain in joint, forearm   . Pain in joint, shoulder region   . Seasonal allergies   . Sleep apnea    no cpap but uses dental device   . Thoracic or lumbosacral neuritis or radiculitis, unspecified     Past Surgical History:  Procedure Laterality  Date  . COLON RESECTION  10/06   sigmoid colon  . COLONOSCOPY    . INGUINAL HERNIA REPAIR  12/2012   Left  . UPPER GASTROINTESTINAL ENDOSCOPY    . WISDOM TOOTH EXTRACTION      Social History   Tobacco Use  . Smoking status: Former Smoker    Types: Cigarettes, Cigars    Quit date: 10/04/1987    Years since quitting: 32.5  . Smokeless tobacco: Never Used  Substance Use Topics  . Alcohol use: Yes    Alcohol/week: 7.0 - 14.0 standard drinks    Types: 7 - 14 Standard drinks or equivalent per week    Comment: 2 daily   . Drug use: No    Family History  Problem Relation Age of Onset  . Other Father        MVA  . Lung cancer Mother 68       Deceased  . Diabetes Maternal Aunt    . Heart disease Paternal Grandmother   . Heart attack Paternal Grandmother   . Healthy Brother        x2  . Aneurysm Sister        Brain  . Allergies Daughter        x2  . Migraines Daughter        x1  . Colon cancer Neg Hx   . Colon polyps Neg Hx   . Esophageal cancer Neg Hx   . Rectal cancer Neg Hx   . Stomach cancer Neg Hx   . Pancreatic cancer Neg Hx     Allergies  Allergen Reactions  . Escitalopram Hives    Pt felt anxious  Pt felt his skin was "on fire"  . Protonix [Pantoprazole Sodium] Other (See Comments)    Tingling of extremities    Medication list has been reviewed and updated.  Current Outpatient Medications on File Prior to Visit  Medication Sig Dispense Refill  . b complex vitamins tablet Take 1 tablet by mouth daily. Every other day    . celecoxib (CELEBREX) 200 MG capsule Take 200 mg by mouth daily as needed.    . cholecalciferol (VITAMIN D3) 25 MCG (1000 UT) tablet Take 1,000 Units by mouth daily.    [name] esomeprazole (NEXIUM) 40 MG capsule Take 1 capsule (40 mg total) by mouth daily at 12 noon. 90 capsule 1  . gabapentin (NEURONTIN) 100 MG capsule TAKE 1 CAPSULE BY MOUTH 3  TIMES DAILY 270 capsule 3  . LORazepam (ATIVAN) 1 MG tablet Take 1 tablet (1 mg total) by mouth as needed for anxiety. May take 1 per day 30 tablet 0  . metoprolol tartrate (LOPRESSOR) 25 MG tablet Take 1 tablet (25 mg total) by mouth as needed (in the evening for palpitations). (Patient taking differently: Take 50 mg by mouth as needed (in the evening for palpitations). ) 30 tablet 3  . Multiple Vitamin (MULTIVITAMIN) tablet Take 1 tablet by mouth daily. Men's 50 plus multivitamin daily    . pravastatin (PRAVACHOL) 20 MG tablet Take 1 tablet (20 mg total) by mouth daily. 90 tablet 3   Current Facility-Administered Medications on File Prior to Visit  Medication Dose Route Frequency Provider Last Rate Last Admin  . 0.9 %  sodium chloride infusion  500 mL Intravenous Once Irene Shipper, MD         Review of Systems:  As per HPI- otherwise negative.   Physical Examination: Vitals:   04/19/20 1025  BP: 118/77  Pulse: (!) 53  Resp: 17  Temp: (!) 97.5 F (36.4 C)  SpO2: 98%   Vitals:   04/19/20 1025  Weight: 141 lb (64 kg)  Height: 5\' 5"  (1.651 m)   Body mass index is 23.46 kg/m. Ideal Body Weight: Weight in (lb) to have BMI = 25: 149.9  GEN: no acute distress.  Normal weight, looks well HEENT: Atraumatic, Normocephalic.   Bilateral TM wnl, oropharynx normal.  PEERL,EOMI.   left neck/thyroid fuller than expected on exam today Ears and Nose: No external deformity. CV: RRR, No M/G/R. No JVD. No thrill. No extra heart sounds. PULM: CTA B, no wheezes, crackles, rhonchi. No retractions. No resp. distress. No accessory muscle use. ABD: S, NT, ND, +BS. No rebound. No HSM. EXTR: No c/c/e PSYCH: Normally interactive. Conversant.    Assessment and Plan: Physical exam  Screening for hyperlipidemia - Plan: Lipid panel  Elevated BP without diagnosis of hypertension  Screening for prostate cancer - Plan: PSA  Screening for diabetes mellitus - Plan: Hemoglobin A1c  Thyroid enlargement - Plan: US THYROID, TSH  Here today for complete physical exam.  Labs are pending as above- Will plan further follow- up pending labs. Blood pressure and pulse are on the low side.  We will have him try decreasing his beta-blocker by 50% He notes palpitations that are worse after eating.  He did not have hiatal hernia on endoscopy last year.  Advised patient that I am not sure why he is experiencing this symptom.  He is seeing cardiology in about a week, he will query his cardiologist about this issue  Ordered neck ultrasound to evaluate fullness in left side of neck Will plan further follow- up pending labs. Once labs return, need to fax in insurance form for patient  This visit occurred during the SARS-CoV-2 public health emergency.  Safety protocols were in place, including  screening questions prior to the visit, additional usage of staff PPE, and extensive cleaning of exam room while observing appropriate contact time as indicated for disinfecting solutions.    Signed Lamar Blinks, MD   Received labs as below, filled out insurance form and send message to patient  Results for orders placed or performed in visit on 04/19/20  Lipid panel  Result Value Ref Range   Cholesterol 176 0 - 200 mg/dL   Triglycerides 104.0 0.0 - 149.0 mg/dL   HDL 47.00 >39.00 mg/dL   VLDL 20.8 0.0 - 40.0 mg/dL   LDL Cholesterol 108 (H) 0 - 99 mg/dL   Total CHOL/HDL Ratio 4    NonHDL 129.17   PSA  Result Value Ref Range   PSA 0.78 0.10 - 4.00 ng/mL  Hemoglobin A1c  Result Value Ref Range   Hgb A1c MFr Bld 5.7 4.6 - 6.5 %  TSH  Result Value Ref Range   TSH 1.18 0.35 - 4.50 uIU/mL

## 2020-04-19 ENCOUNTER — Ambulatory Visit (INDEPENDENT_AMBULATORY_CARE_PROVIDER_SITE_OTHER): Payer: 59 | Admitting: Family Medicine

## 2020-04-19 ENCOUNTER — Other Ambulatory Visit: Payer: Self-pay

## 2020-04-19 ENCOUNTER — Encounter: Payer: Self-pay | Admitting: Family Medicine

## 2020-04-19 VITALS — BP 118/77 | HR 53 | Temp 97.5°F | Resp 17 | Ht 65.0 in | Wt 141.0 lb

## 2020-04-19 DIAGNOSIS — Z125 Encounter for screening for malignant neoplasm of prostate: Secondary | ICD-10-CM | POA: Diagnosis not present

## 2020-04-19 DIAGNOSIS — Z0000 Encounter for general adult medical examination without abnormal findings: Secondary | ICD-10-CM | POA: Diagnosis not present

## 2020-04-19 DIAGNOSIS — R030 Elevated blood-pressure reading, without diagnosis of hypertension: Secondary | ICD-10-CM | POA: Diagnosis not present

## 2020-04-19 DIAGNOSIS — E049 Nontoxic goiter, unspecified: Secondary | ICD-10-CM | POA: Diagnosis not present

## 2020-04-19 DIAGNOSIS — Z13220 Encounter for screening for lipoid disorders: Secondary | ICD-10-CM | POA: Diagnosis not present

## 2020-04-19 DIAGNOSIS — Z131 Encounter for screening for diabetes mellitus: Secondary | ICD-10-CM

## 2020-04-19 LAB — TSH: TSH: 1.18 u[IU]/mL (ref 0.35–4.50)

## 2020-04-19 LAB — LIPID PANEL
Cholesterol: 176 mg/dL (ref 0–200)
HDL: 47 mg/dL (ref >39.00)
LDL Cholesterol: 108 mg/dL — ABNORMAL HIGH (ref 0–99)
NonHDL: 129.17
Total CHOL/HDL Ratio: 4
Triglycerides: 104 mg/dL (ref 0.0–149.0)
VLDL: 20.8 mg/dL (ref 0.0–40.0)

## 2020-04-19 LAB — PSA: PSA: 0.78 ng/mL (ref 0.10–4.00)

## 2020-04-19 LAB — HEMOGLOBIN A1C: Hgb A1c MFr Bld: 5.7 % (ref 4.6–6.5)

## 2020-04-26 ENCOUNTER — Ambulatory Visit (HOSPITAL_BASED_OUTPATIENT_CLINIC_OR_DEPARTMENT_OTHER)
Admission: RE | Admit: 2020-04-26 | Discharge: 2020-04-26 | Disposition: A | Discharge status: Home / Self Care | Payer: 59 | Source: Ambulatory Visit | Attending: Family Medicine | Admitting: Family Medicine

## 2020-04-26 ENCOUNTER — Other Ambulatory Visit: Payer: Self-pay

## 2020-04-26 DIAGNOSIS — E049 Nontoxic goiter, unspecified: Secondary | ICD-10-CM | POA: Insufficient documentation

## 2020-04-27 ENCOUNTER — Encounter: Payer: Self-pay | Admitting: Family Medicine

## 2020-04-30 ENCOUNTER — Telehealth (INDEPENDENT_AMBULATORY_CARE_PROVIDER_SITE_OTHER): Payer: 59 | Admitting: Internal Medicine

## 2020-04-30 ENCOUNTER — Encounter: Payer: Self-pay | Admitting: Internal Medicine

## 2020-04-30 VITALS — BP 120/72 | HR 73 | Ht 65.0 in | Wt 150.0 lb

## 2020-04-30 DIAGNOSIS — E782 Mixed hyperlipidemia: Secondary | ICD-10-CM

## 2020-04-30 DIAGNOSIS — R002 Palpitations: Secondary | ICD-10-CM

## 2020-04-30 DIAGNOSIS — E785 Hyperlipidemia, unspecified: Secondary | ICD-10-CM | POA: Diagnosis not present

## 2020-04-30 MED ORDER — PRAVASTATIN SODIUM 40 MG PO TABS: 40.0000 mg | ORAL_TABLET | Freq: Every day | ORAL | 3 refills | Status: DC

## 2020-04-30 MED FILL — PRAVASTATIN NA 40 MG TAB: 40 | 30 days supply | Qty: 30 | Fill #0

## 2020-04-30 NOTE — Patient Instructions (Addendum)
Medication Instructions:  INCREASE YOUR PRAVASTATIN TO 40 MG DAILY   *If you need a refill on your cardiac medications before your next appointment, please call your pharmacy*  Lab Work: YOU WILL NEED A COVID SCREENING TEST 3 DAYS PRIOR TO YOUR STRESS TEST. ONCE YOU HAVE THIS YOU WILL HAVE TO QUARANTINE YOURSELF UNTIL AFTER STRESS TEST  If you have labs (blood work) drawn today and your tests are completely normal, you will receive your results only by: [name] MyChart Message (if you have MyChart) OR . A paper copy in the mail If you have any lab test that is abnormal or we need to change your treatment, we will call you to review the results.   Testing/Procedures: Your physician has requested that you have en exercise stress myoview. For further information please visit [email or web address]. Please follow instruction sheet, as given. [address]   Follow-Up: At [facility], you and your health needs are our priority.  As part of our continuing mission to provide you with exceptional heart care, we have created designated Provider Care Teams.  These Care Teams include your primary Cardiologist (physician) and Advanced Practice Providers (APPs -  Physician Assistants and Nurse Practitioners) who all work together to provide you with the care you need, when you need it.  We recommend signing up for the patient portal called "MyChart".  Sign up information is provided on this After Visit Summary.  MyChart is used to connect with patients for Virtual Visits (Telemedicine).  Patients are able to view lab/test results, encounter notes, upcoming appointments, etc.  Non-urgent messages can be sent to your provider as well.   To learn more about what you can do with MyChart, go to [email or web address].    Your next appointment:   1 month(s)  The format for your next appointment:   In Person  Provider:   You may see DR Margaretann Loveless  or one of the following Advanced  Practice Providers on your designated Care Team:    Rosaria Ferries, PA-C  Jory Sims, DNP, ANP  Cadence Kathlen Mody, NP

## 2020-04-30 NOTE — Progress Notes (Signed)
Virtual Visit via Telephone Note   This visit type was conducted due to national recommendations for restrictions regarding the COVID-19 Pandemic (e.g. social distancing) in an effort to limit this patient's exposure and mitigate transmission in our community.  Due to his co-morbid illnesses, this patient is at least at moderate risk for complications without adequate follow up.  This format is felt to be most appropriate for this patient at this time.  The patient did not have access to video technology/had technical difficulties with video requiring transitioning to audio format only (telephone).  All issues noted in this document were discussed and addressed.  No physical exam could be performed with this format.  Please refer to the patient's chart for his  consent to telehealth for [facility].   The patient was identified using 2 identifiers.  Date:  04/30/2020   ID:  Curtis Luna, DOB Apr 27, 1961, MRN [id number]  Patient Location: Home Provider Location: Office  PCP:  Darreld Mclean, MD  Cardiologist:  No primary care provider on file.  Electrophysiologist:  None   Evaluation Performed:  Follow-Up Visit  Chief Complaint:  F/u palpitations  History of Present Illness:    Curtis Luna is a 59 y.o. male with   Stopped metoprolol succinate 50 mg daily due to feeling poorly and low BP.   Went back to metoprolol tartrate 25 mg BID but had low BP and pulse so dose was recommended to decrease to 12.5 mg BID.  Has recurrent palpitations on the lower dose of metoprolol, but feels this is the less bothersome option.  Continues to note a sense of palpitations after heavy meal.  He notes that this will occur after eating shredded wheat.  He does not note an association with caffeine or coffee consumption.  We discussed etiologies of postprandial symptoms.  He does have a history of GERD and takes PPI and historically has taken H2 blockers.  He feels this sensation is different than  his typical GERD.  We discussed his normal echocardiogram from 2019.  He has never had ischemic testing.  The patient denies dyspnea at rest or with exertion, PND, orthopnea, or leg swelling. Denies cough, fever, chills. Denies nausea, vomiting. Denies syncope or presyncope. Denies dizziness or lightheadedness.  The patient does not have symptoms concerning for COVID-19 infection (fever, chills, cough, or new shortness of breath).    Past Medical History:  Diagnosis Date  . Abdominal pain, right lower quadrant   . Acute bronchospasm   . Allergy   . Anxiety    on and off  . Backache, unspecified   . Chicken pox   . Diverticulosis of colon (without mention of hemorrhage)   . Esophageal reflux   . External hemorrhoids without mention of complication    thrombosed  . Hiatal hernia   . Hyperlipidemia    no medicines   . Inguinal hernia without mention of obstruction or gangrene, unilateral or unspecified, (not specified as recurrent)   . Migraine   . Obstructive sleep apnea (adult) (pediatric)   . Other symptoms involving digestive system(787.99)   . Pain in joint, forearm   . Pain in joint, shoulder region   . Seasonal allergies   . Sleep apnea    no cpap but uses dental device   . Thoracic or lumbosacral neuritis or radiculitis, unspecified    Past Surgical History:  Procedure Laterality Date  . COLON RESECTION  10/06   sigmoid colon  . COLONOSCOPY    .  INGUINAL HERNIA REPAIR  12/2012   Left  . UPPER GASTROINTESTINAL ENDOSCOPY    . WISDOM TOOTH EXTRACTION       Current Meds  Medication Sig  . b complex vitamins tablet Take 1 tablet by mouth daily. Every other day  . celecoxib (CELEBREX) 200 MG capsule Take 200 mg by mouth daily as needed.  . cholecalciferol (VITAMIN D3) 25 MCG (1000 UT) tablet Take 1,000 Units by mouth daily.  [name] esomeprazole (NEXIUM) 40 MG capsule Take 1 capsule (40 mg total) by mouth daily at 12 noon.  . gabapentin (NEURONTIN) 100 MG capsule TAKE 1  CAPSULE BY MOUTH 3  TIMES DAILY  . LORazepam (ATIVAN) 1 MG tablet Take 1 tablet (1 mg total) by mouth as needed for anxiety. May take 1 per day  . metoprolol tartrate (LOPRESSOR) 25 MG tablet Take 1 tablet (25 mg total) by mouth as needed (in the evening for palpitations).  . Multiple Vitamin (MULTIVITAMIN) tablet Take 1 tablet by mouth daily. Men's 50 plus multivitamin daily  . pravastatin (PRAVACHOL) 20 MG tablet Take 1 tablet (20 mg total) by mouth daily.   Current Facility-Administered Medications for the 04/30/20 encounter (Video Visit) with Elouise Munroe, MD  Medication  . 0.9 %  sodium chloride infusion     Allergies:   Escitalopram and Protonix [pantoprazole sodium]   Social History   Tobacco Use  . Smoking status: Former Smoker    Types: Cigarettes, Cigars    Quit date: 10/04/1987    Years since quitting: 32.5  . Smokeless tobacco: Never Used  Substance Use Topics  . Alcohol use: Yes    Alcohol/week: 7.0 - 14.0 standard drinks    Types: 7 - 14 Standard drinks or equivalent per week    Comment: 2 daily   . Drug use: No     Family Hx: The patient's family history includes Allergies in his daughter; Aneurysm in his sister; Diabetes in his maternal aunt; Healthy in his brother; Heart attack in his paternal grandmother; Heart disease in his paternal grandmother; Lung cancer (age of onset: 98) in his mother; Migraines in his daughter; Other in his father. There is no history of Colon cancer, Colon polyps, Esophageal cancer, Rectal cancer, Stomach cancer, or Pancreatic cancer.  ROS:   Please see the history of present illness.     All other systems reviewed and are negative.   Prior CV studies:   The following studies were reviewed today:    Labs/Other Tests and Data Reviewed:    EKG:  No ECG reviewed.  Recent Labs: 07/25/2019: ALT 18 02/01/2020: BUN 15; Creatinine, Ser 0.94; Hemoglobin 14.8; Platelets 290.0; Potassium 4.4; Sodium 140 04/19/2020: TSH 1.18    Recent Lipid Panel Lab Results  Component Value Date/Time   CHOL 176 04/19/2020 10:53 AM   TRIG 104.0 04/19/2020 10:53 AM   HDL 47.00 04/19/2020 10:53 AM   CHOLHDL 4 04/19/2020 10:53 AM   LDLCALC 108 (H) 04/19/2020 10:53 AM   LDLDIRECT 146 (H) 10/17/2014 09:30 AM    Wt Readings from Last 3 Encounters:  04/30/20 150 lb (68 kg)  04/19/20 141 lb (64 kg)  04/03/20 147 lb (66.7 kg)     Objective:    Vital Signs:  BP 120/72   Pulse 73   Ht 5\' 5"  (1.651 m)   Wt 150 lb (68 kg)   BMI 24.96 kg/m    VITAL SIGNS:  reviewed GEN:  no acute distress RESPIRATORY:  normal respiratory effort, no increased  work of breathing NEURO:  alert and oriented x 3, speech normal PSYCH:  normal affect   ASSESSMENT & PLAN:    Palpitations  He notes a sense of postprandial palpitations.  We thoroughly discussed options for ischemic testing.  Given his frequent PACs, CT coronary angiogram may subject to artifact.  In light of this we will perform treadmill stress Myoview to exclude ischemia in the setting of possible atypical symptoms.  Mixed hyperlipidemia The patient is currently on pravastatin 20 mg daily and has a mildly elevated LDL of 108.  We discussed increasing the dose of pravastatin we will follow this with both primary care and our office, and if there is any suggestion of coronary artery disease, we will transition to high potency statin therapy.  We will see if he tolerates pravastatin 40 mg daily for now.  We can reassess lipids in 3 months.    COVID-19 Education: The signs and symptoms of COVID-19 were discussed with the patient and how to seek care for testing (follow up with PCP or arrange E-visit).  The importance of social distancing was discussed today.  Time:   Today, I have spent 17 minutes with the patient with telehealth technology discussing the above problems.     Medication Adjustments/Labs and Tests Ordered: Current medicines are reviewed at length with the patient  today.  Concerns regarding medicines are outlined above.   Tests Ordered: No orders of the defined types were placed in this encounter.   Medication Changes: No orders of the defined types were placed in this encounter.   Follow Up: 1 mo  Signed, Elouise Munroe, MD  04/30/2020 8:30 AM    [place]

## 2020-05-01 DIAGNOSIS — R030 Elevated blood-pressure reading, without diagnosis of hypertension: Secondary | ICD-10-CM

## 2020-05-01 DIAGNOSIS — R002 Palpitations: Secondary | ICD-10-CM

## 2020-05-11 NOTE — Telephone Encounter (Signed)
Ordered placed for CCTA , Instruction sent via mychart.

## 2020-05-15 ENCOUNTER — Other Ambulatory Visit: Payer: Self-pay

## 2020-05-15 ENCOUNTER — Emergency Department (HOSPITAL_BASED_OUTPATIENT_CLINIC_OR_DEPARTMENT_OTHER)
Admission: EM | Admit: 2020-05-15 | Discharge: 2020-05-15 | Disposition: A | Discharge status: Home / Self Care | Payer: 59 | Attending: Emergency Medicine | Admitting: Emergency Medicine

## 2020-05-15 ENCOUNTER — Encounter (HOSPITAL_BASED_OUTPATIENT_CLINIC_OR_DEPARTMENT_OTHER): Payer: Self-pay | Admitting: *Deleted

## 2020-05-15 ENCOUNTER — Emergency Department (HOSPITAL_BASED_OUTPATIENT_CLINIC_OR_DEPARTMENT_OTHER): Payer: 59

## 2020-05-15 DIAGNOSIS — R002 Palpitations: Secondary | ICD-10-CM | POA: Insufficient documentation

## 2020-05-15 DIAGNOSIS — Z87891 Personal history of nicotine dependence: Secondary | ICD-10-CM | POA: Insufficient documentation

## 2020-05-15 DIAGNOSIS — Z79899 Other long term (current) drug therapy: Secondary | ICD-10-CM | POA: Insufficient documentation

## 2020-05-15 DIAGNOSIS — E785 Hyperlipidemia, unspecified: Secondary | ICD-10-CM | POA: Diagnosis not present

## 2020-05-15 DIAGNOSIS — Z888 Allergy status to other drugs, medicaments and biological substances status: Secondary | ICD-10-CM | POA: Diagnosis not present

## 2020-05-15 LAB — COMPREHENSIVE METABOLIC PANEL
ALT: 24 U/L (ref 0–44)
AST: 28 U/L (ref 15–41)
Albumin: 4.6 g/dL (ref 3.5–5.0)
Alkaline Phosphatase: 45 U/L (ref 38–126)
Anion gap: 9 (ref 5–15)
BUN: 16 mg/dL (ref 6–20)
CO2: 26 mmol/L (ref 22–32)
Calcium: 9.3 mg/dL (ref 8.9–10.3)
Chloride: 105 mmol/L (ref 98–111)
Creatinine, Ser: 0.9 mg/dL (ref 0.61–1.24)
GFR calc Af Amer: >60 mL/min (ref >60)
GFR calc non Af Amer: >60 mL/min (ref >60)
Glucose, Bld: 107 mg/dL — ABNORMAL HIGH (ref 70–99)
Potassium: 3.8 mmol/L (ref 3.5–5.1)
Sodium: 140 mmol/L (ref 135–145)
Total Bilirubin: 0.8 mg/dL (ref 0.3–1.2)
Total Protein: 7.5 g/dL (ref 6.5–8.1)

## 2020-05-15 LAB — CBC
HCT: 43.4 % (ref 39.0–52.0)
Hemoglobin: 15.2 g/dL (ref 13.0–17.0)
MCH: 32.4 pg (ref 26.0–34.0)
MCHC: 35 g/dL (ref 30.0–36.0)
MCV: 92.5 fL (ref 80.0–100.0)
Platelets: 288 10*3/uL (ref 150–400)
RBC: 4.69 MIL/uL (ref 4.22–5.81)
RDW: 14 % (ref 11.5–15.5)
WBC: 6.2 10*3/uL (ref 4.0–10.5)
nRBC: 0 % (ref 0.0–0.2)

## 2020-05-15 LAB — TROPONIN I (HIGH SENSITIVITY): Troponin I (High Sensitivity): 3 ng/L (ref <18)

## 2020-05-15 NOTE — ED Notes (Signed)
Family at bedside.ED Provider at bedside. 

## 2020-05-15 NOTE — ED Notes (Addendum)
Pt. Reports he feels like there is a pressure in the mid sternum area of his chest and feels like his heart is pounding and working hard.  Pt. Reports he feels a pressure between the breast R and L.  Pt. Also reports a burning type sensation.  He was told to come to the ED due to his constant sensation since last PM and was feeling weak in his legs.

## 2020-05-15 NOTE — ED Triage Notes (Signed)
Palpitations for months. He saw a cardiologist and wore a holter monitor with no concerns.

## 2020-05-15 NOTE — ED Provider Notes (Signed)
[facility] EMERGENCY DEPARTMENT Provider Note   CSN: [id number] Arrival date & time: 05/15/20  1256     History Chief Complaint  Patient presents with  . Palpitations    Curtis Luna is a 59 y.o. male.  Patient with history of hyperlipidemia, GERD, EGD (reviewed, 05/23/2019 was normal) --presents to the emergency department today for intense palpitations. Patient states that it feels like his heart is throbbing, pounding, and "going crazy". Patient started having the symptoms several months ago. He initially spoke with his PCP about this and he had a Holter monitor which was unrevealing. Symptoms have been intermittent, however have been more persistent since yesterday morning and have not improved, prompting emergency department evaluation today. Patient describes a tight sensation across his mid chest with palpitation. In addition, he has weakness in his bilateral legs described as a "rubber legs". He continues to be able walk and do activities. He tried to go to work this morning however symptoms persisted. He called his doctor's office and they encouraged him to come to the emergency department. Reports heart rates in the eighties on his apple watch. Patient is scheduled for a CT or stress test in the near future. He also has an upcoming appointment with his gastroenterologist. Symptoms are typically worse after eating. Patient also states that he feels anxious due to the symptoms. No significant shortness of breath or trouble breathing. Patient has been treated with metoprolol however this tended to make his heart rate and blood pressure very low, so recently he discontinued this despite previous attempts at titrating the medication.        Past Medical History:  Diagnosis Date  . Abdominal pain, right lower quadrant   . Acute bronchospasm   . Allergy   . Anxiety    on and off  . Backache, unspecified   . Chicken pox   . Diverticulosis of colon (without mention of  hemorrhage)   . Esophageal reflux   . External hemorrhoids without mention of complication    thrombosed  . Hiatal hernia   . Hyperlipidemia    no medicines   . Inguinal hernia without mention of obstruction or gangrene, unilateral or unspecified, (not specified as recurrent)   . Migraine   . Obstructive sleep apnea (adult) (pediatric)   . Other symptoms involving digestive system(787.99)   . Pain in joint, forearm   . Pain in joint, shoulder region   . Seasonal allergies   . Sleep apnea    no cpap but uses dental device   . Thoracic or lumbosacral neuritis or radiculitis, unspecified     Patient Active Problem List   Diagnosis Date Noted  . Toe pain, right 08/23/2018  . Pain of left heel 06/27/2018  . Multiple thyroid nodules 11/05/2015  . Peripheral neuropathy 04/18/2015  . Vertigo 04/15/2015  . FH: brain aneurysm 04/15/2015  . 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 Seasonal affective disorder ([place]) 10/02/2014  . Internal hemorrhoid 04/20/2014  . Chronic low back pain 04/20/2014  . Anxiety 04/20/2014  . Esophageal reflux 04/04/2011  . Diverticulosis of colon (without mention of hemorrhage) 04/04/2011    Past Surgical History:  Procedure Laterality Date  . COLON RESECTION  10/06   sigmoid colon  . COLONOSCOPY    . INGUINAL HERNIA REPAIR  12/2012   Left  . UPPER GASTROINTESTINAL ENDOSCOPY    . WISDOM TOOTH EXTRACTION         Family History  Problem Relation Age of Onset  .  Other Father        MVA  . Lung cancer Mother 14       Deceased  . Diabetes Maternal Aunt   . Heart disease Paternal Grandmother   . Heart attack Paternal Grandmother   . Healthy Brother        x2  . Aneurysm Sister        Brain  . Allergies Daughter        x2  . Migraines Daughter        x1  . Colon cancer Neg Hx   . Colon polyps Neg Hx   . Esophageal cancer Neg Hx   . Rectal cancer Neg Hx   . Stomach cancer Neg Hx   . Pancreatic cancer Neg Hx     Social  History   Tobacco Use  . Smoking status: Former Smoker    Types: Cigarettes, Cigars    Quit date: 10/04/1987    Years since quitting: 32.6  . Smokeless tobacco: Never Used  Substance Use Topics  . Alcohol use: Yes    Alcohol/week: 7.0 - 14.0 standard drinks    Types: 7 - 14 Standard drinks or equivalent per week    Comment: 2 daily   . Drug use: No    Home Medications Prior to Admission medications   Medication Sig Start Date End Date Taking? Authorizing Provider  b complex vitamins tablet Take 1 tablet by mouth daily. Every other day    [provider]  celecoxib (CELEBREX) 200 MG capsule Take 200 mg by mouth daily as needed.    [provider]  cholecalciferol (VITAMIN D3) 25 MCG (1000 UT) tablet Take 1,000 Units by mouth daily.    [provider]  esomeprazole (NEXIUM) 40 MG capsule Take 1 capsule (40 mg total) by mouth daily at 12 noon. 02/29/20   Irene Shipper, MD  gabapentin (NEURONTIN) 100 MG capsule TAKE 1 CAPSULE BY MOUTH 3  TIMES DAILY 12/28/19   Copland, Gay Filler, MD  LORazepam (ATIVAN) 1 MG tablet Take 1 tablet (1 mg total) by mouth as needed for anxiety. May take 1 per day 07/06/18   Copland, Gay Filler, MD  metoprolol tartrate (LOPRESSOR) 25 MG tablet Take 1 tablet (25 mg total) by mouth as needed (in the evening for palpitations). 04/03/20 07/02/20  Elouise Munroe, MD  Multiple Vitamin (MULTIVITAMIN) tablet Take 1 tablet by mouth daily. Men's 50 plus multivitamin daily    [provider]  pravastatin (PRAVACHOL) 40 MG tablet Take 1 tablet (40 mg total) by mouth daily. 04/30/20   Elouise Munroe, MD    Allergies    Escitalopram and Protonix [pantoprazole sodium]  Review of Systems   Review of Systems  Constitutional: Negative for fever.  HENT: Negative for rhinorrhea and sore throat.   Eyes: Negative for redness.  Respiratory: Positive for chest tightness. Negative for cough and shortness of breath.   Cardiovascular: Positive  for palpitations. Negative for chest pain.  Gastrointestinal: Negative for abdominal pain, diarrhea, nausea and vomiting.  Genitourinary: Negative for dysuria.  Musculoskeletal: Negative for myalgias.  Skin: Negative for rash.  Neurological: Positive for weakness and light-headedness. Negative for syncope and headaches.    Physical Exam Updated Vital Signs BP (!) 142/88   Pulse 93   Temp 98.3 F (36.8 C) (Oral)   Resp 18   Ht 5\' 5"  (1.651 m)   Wt 68 kg   SpO2 98%   BMI 24.95 kg/m   Physical  Exam Vitals and nursing note reviewed.  Constitutional:      Appearance: He is well-developed. He is not diaphoretic.  HENT:     Head: Normocephalic and atraumatic.     Mouth/Throat:     Mouth: Mucous membranes are not dry.  Eyes:     Conjunctiva/sclera: Conjunctivae normal.  Neck:     Vascular: Normal carotid pulses. No carotid bruit or JVD.     Trachea: Trachea normal. No tracheal deviation.  Cardiovascular:     Rate and Rhythm: Normal rate and regular rhythm.     Pulses: No decreased pulses.     Heart sounds: Normal heart sounds, S1 normal and S2 normal. Heart sounds not distant. No murmur.  Pulmonary:     Effort: Pulmonary effort is normal. No respiratory distress.     Breath sounds: Normal breath sounds. No wheezing.  Chest:     Chest wall: No tenderness.  Abdominal:     General: Bowel sounds are normal.     Palpations: Abdomen is soft.     Tenderness: There is no abdominal tenderness. There is no guarding or rebound.  Musculoskeletal:     Cervical back: Normal range of motion and neck supple. No muscular tenderness.  Skin:    General: Skin is warm and dry.     Coloration: Skin is not pale.  Neurological:     Mental Status: He is alert.  Psychiatric:        Attention and Perception: Attention normal.        Mood and Affect: Mood is anxious.        Speech: Speech normal.        Behavior: Behavior normal. Behavior is cooperative.        Thought Content: Thought content  normal.     ED Results / Procedures / Treatments   Labs (all labs ordered are listed, but only abnormal results are displayed) Labs Reviewed - No data to display  EKG EKG Interpretation  Date/Time:  Tuesday May 15 2020 13:07:08 EDT Ventricular Rate:  81 PR Interval:    QRS Duration: 82 QT Interval:  362 QTC Calculation: 421 R Axis:   79 Text Interpretation: Sinus rhythm Ventricular premature complex Probable left atrial enlargement Borderline T wave abnormalities Confirmed by Lennice Sites [phone]) on 05/15/2020 1:18:05 PM   Radiology DG Chest 2 View  Result Date: 05/15/2020 CLINICAL DATA:  Palpitations. Additional provided: Palpitations for 2 days with chest pressure. EXAM: CHEST - 2 VIEW COMPARISON:  Prior chest radiograph 11/21/2019 FINDINGS: Heart size within normal limits. There is no appreciable airspace consolidation. No evidence of pleural effusion or pneumothorax. No acute bony abnormality identified. IMPRESSION: No evidence of acute cardiopulmonary abnormality. Electronically Signed   By: Kellie Simmering DO   On: 05/15/2020 13:46    Procedures Procedures (including critical care time)  Medications Ordered in ED Medications - No data to display  ED Course  I have reviewed the triage vital signs and the nursing notes.  Pertinent labs & imaging results that were available during my care of the patient were reviewed by me and considered in my medical decision making (see chart for details).  Patient seen and examined. Patient has had an extensive ongoing work-up for his palpitations. These do not appear to be due to an irregular heartbeat as he has had a negative Holter monitor test and is in normal sinus rhythm while symptomatic here. He has upcoming cardiology evaluation as well as a GI appointment. He had a recent  normal thyroid ultrasound and TSH. Overall he looks well. He does seem to be uncomfortable with his symptoms and very anxious about them. Given the chest  tightness that he reports, will perform ischemic evaluation as well as a chest x-ray. If this is unrevealing, he will follow up with continued GI and cardiology evaluation.   Vital signs reviewed and are as follows: BP (!) 142/88   Pulse 93   Temp 98.3 F (36.8 C) (Oral)   Resp 18   Ht 5\' 5"  (1.651 m)   Wt 68 kg   SpO2 98%   BMI 24.95 kg/m   2:57 PM I discussed work-up with patient and wife at bedside.  He does seem somewhat reassured.  I think that the previous work-up has been reasonable as is current follow-up plan.  We agreed, no obvious dangerous or life-threatening etiologies of his symptoms at present time.  It does not appear to be due to an abnormal heart rhythm.  Troponin is negative.  Chest x-ray is clear.  We discussed utility of CT imaging of the chest.  At this point I do not have a medical indication for this and he may very well have a cardiac CT in the near future.  Will defer this at this time.  Patient was counseled to return with severe chest pain, especially if the pain is crushing or pressure-like and spreads to the arms, back, neck, or jaw, or if they have sweating, nausea, or shortness of breath with the pain. They were encouraged to call 911 with these symptoms.   The patient verbalized understanding and agreed.      MDM Rules/Calculators/A&P                      Mr. Romo is here with ongoing palpitations.  Fortunately, work-up in the ED today is reassuring.  EKG with a PVC otherwise normal.  I do not see a large burden of PVCs or PACs while on telemetry.  Troponin is negative x1.  Chest x-ray is clear without any abnormalities.  Electrolytes are normal.  Doubt pericarditis, myocarditis.  Doubt aortic dissection.  Presentation will be very atypical for ACS or PE.  Patient has continuing follow-up upcoming with cardiology, GI, and PCP.  No indications for admission at this time.  Return instructions as above.   Final Clinical Impression(s) / ED Diagnoses Final  diagnoses:  Palpitations    Rx / DC Orders ED Discharge Orders    None       Carlisle Cater, PA-C 05/15/20 [place], Yell, DO 05/16/20 [phone]

## 2020-05-15 NOTE — Discharge Instructions (Signed)
Please read and follow all provided instructions.  Your diagnoses today include:  1. Palpitations     Tests performed today include:  An EKG of your heart - shows one skipped beat, otherwise normal  A chest x-ray  Cardiac enzymes - a blood test for heart muscle damage  Blood counts and electrolytes  Vital signs. See below for your results today.   Medications prescribed:   None  Take any prescribed medications only as directed.  Follow-up instructions: Please follow-up with your PCP, cardiologist, GI doctor as planned.  Return instructions:  SEEK IMMEDIATE MEDICAL ATTENTION IF:  You have severe chest pain, especially if the pain is crushing or pressure-like and spreads to the arms, back, neck, or jaw, or if you have sweating, nausea (feeling sick to your stomach), or shortness of breath. THIS IS AN EMERGENCY. Don't wait to see if the pain will go away. Get medical help at once. Call 911 or 0 (operator). DO NOT drive yourself to the hospital.   Your chest pain gets worse and does not go away with rest.   You have an attack of chest pain lasting longer than usual, despite rest and treatment with the medications your caregiver has prescribed.   You wake from sleep with chest pain or shortness of breath.  You feel dizzy or faint.  You have chest pain not typical of your usual pain for which you originally saw your caregiver.   You have any other emergent concerns regarding your health.  Additional Information: Chest pain comes from many different causes. Your caregiver has diagnosed you as having chest pain that is not specific for one problem, but does not require admission.  You are at low risk for an acute heart condition or other serious illness.   Your vital signs today were: BP (!) 142/88   Pulse 93   Temp 98.3 F (36.8 C) (Oral)   Resp 18   Ht 5\' 5"  (1.651 m)   Wt 68 kg   SpO2 98%   BMI 24.95 kg/m  If your blood pressure (BP) was elevated above 135/85 this  visit, please have this repeated by your doctor within one month. --------------

## 2020-05-17 ENCOUNTER — Ambulatory Visit: Payer: 59 | Admitting: Family Medicine

## 2020-05-18 ENCOUNTER — Other Ambulatory Visit (HOSPITAL_COMMUNITY): Payer: 59

## 2020-05-19 ENCOUNTER — Other Ambulatory Visit: Payer: Self-pay | Admitting: Family Medicine

## 2020-05-22 ENCOUNTER — Inpatient Hospital Stay (HOSPITAL_COMMUNITY): Admission: RE | Admit: 2020-05-22 | Payer: 59 | Source: Ambulatory Visit

## 2020-05-24 ENCOUNTER — Encounter: Payer: Self-pay | Admitting: Internal Medicine

## 2020-05-24 ENCOUNTER — Ambulatory Visit (INDEPENDENT_AMBULATORY_CARE_PROVIDER_SITE_OTHER): Payer: 59 | Admitting: Internal Medicine

## 2020-05-24 ENCOUNTER — Telehealth: Payer: Self-pay | Admitting: *Deleted

## 2020-05-24 VITALS — BP 120/76 | HR 86 | Ht 65.0 in | Wt 142.4 lb

## 2020-05-24 DIAGNOSIS — K219 Gastro-esophageal reflux disease without esophagitis: Secondary | ICD-10-CM | POA: Diagnosis not present

## 2020-05-24 DIAGNOSIS — R002 Palpitations: Secondary | ICD-10-CM | POA: Diagnosis not present

## 2020-05-24 MED ORDER — ESOMEPRAZOLE MAGNESIUM 40 MG PO CPDR: 40.0000 mg | DELAYED_RELEASE_CAPSULE | Freq: Two times a day (BID) | ORAL | 3 refills | Status: DC

## 2020-05-24 NOTE — Progress Notes (Signed)
HISTORY OF PRESENT ILLNESS:  Curtis Luna is a 59 y.o. male, native of [place], who has been evaluated in this office for GERD and chronic lower abdominal discomfort with diarrhea nocturnally when lying on his left side but not right.  He also has a history of complicated diverticular disease requiring sigmoid colectomy.  Last colonoscopy 2012 evaluation.  I last saw the patient in the office virtually April 2020.  At that time he was complaining of abdominal discomfort and underwent abdominal ultrasound which was unremarkable.  Subsequent upper endoscopy was normal.  He presents himself for evaluation as he is having problems with sensation of heartbeat.  This is been going on for a few months.  He wonders if it might not be as Nexium.  Wonders if it might be hiatal hernia.  He feels the problem occurs mostly after eating.  No dysphagia.  He has had initial cardiac evaluation.  Ongoing.  He was placed on beta-blocker for period of time but this was discontinued due to low blood pressure and low heart rate.  He tells me specifically that he can sense or feel his heartbeat in his anterior chest it is not rapid and it is not irregular.  It may last from 1 to 2 hours for up to 6 hours.  During this time he is not dizzy.  He is anxious.  He tells me that he does check his blood pressure during this prolonged episodes, and it is normal.  No shortness of breath.  No problems with his activities such as playing golf and walking his dog.  Occasionally notices a burning sensation.  Laboratories from May 15, 2020 were unremarkable.  Hemoglobin 15.2.  REVIEW OF SYSTEMS:  All non-GI ROS negative unless otherwise stated in the HPI except for back pain, anxiety Past Medical History:  Diagnosis Date  . Abdominal pain, right lower quadrant   . Acute bronchospasm   . Allergy   . Anxiety    on and off  . Backache, unspecified   . Chicken pox   . Diverticulosis of colon (without mention of hemorrhage)   .  Esophageal reflux   . External hemorrhoids without mention of complication    thrombosed  . Hiatal hernia   . Hyperlipidemia    no medicines   . Inguinal hernia without mention of obstruction or gangrene, unilateral or unspecified, (not specified as recurrent)   . Migraine   . Obstructive sleep apnea (adult) (pediatric)   . Other symptoms involving digestive system(787.99)   . Pain in joint, forearm   . Pain in joint, shoulder region   . Seasonal allergies   . Sleep apnea    no cpap but uses dental device   . Thoracic or lumbosacral neuritis or radiculitis, unspecified     Past Surgical History:  Procedure Laterality Date  . COLON RESECTION  10/06   sigmoid colon  . COLONOSCOPY    . INGUINAL HERNIA REPAIR  12/2012   Left  . UPPER GASTROINTESTINAL ENDOSCOPY    . WISDOM TOOTH EXTRACTION      Social History AVEDIS BEVIS  reports that he quit smoking about 32 years ago. His smoking use included cigarettes and cigars. He has never used smokeless tobacco. He reports current alcohol use of about 7.0 - 14.0 standard drinks of alcohol per week. He reports that he does not use drugs.  family history includes Allergies in his daughter; Aneurysm in his sister; Diabetes in his maternal aunt; Healthy in his brother;  Heart attack in his paternal grandmother; Heart disease in his paternal grandmother; Lung cancer (age of onset: 31) in his mother; Migraines in his daughter; Other in his father.  Allergies  Allergen Reactions  . Escitalopram Hives    Pt felt anxious  Pt felt his skin was "on fire"  . Protonix [Pantoprazole Sodium] Other (See Comments)    Tingling of extremities       PHYSICAL EXAMINATION: Vital signs: BP 120/76   Pulse 86   Ht 5\' 5"  (1.651 m)   Wt 142 lb 6 oz (64.6 kg)   BMI 23.69 kg/m   Constitutional: generally well-appearing, no acute distress Psychiatric: alert and oriented x3, cooperative Eyes: extraocular movements intact, anicteric, conjunctiva pink  Mouth: oral pharynx moist, no lesions Neck: supple no lymphadenopathy Cardiovascular: heart regular rate and rhythm, no murmur Lungs: clear to auscultation bilaterally Abdomen: soft, nontender, nondistended, no obvious ascites, no peritoneal signs, normal bowel sounds, no organomegaly Rectal: Omitted Extremities: no clubbing, cyanosis, or lower extremity edema bilaterally Skin: no lesions on visible extremities Neuro: No focal deficits. No asterixis.    ASSESSMENT:  1.  Sensation of heartbeat.  No worrisome features.  Certainly not GI. 2.  GERD.  On Nexium once daily.  Describes occasional burning.  Query breakthrough reflux.  Normal EGD June 2020.  No hiatal hernia 3.  History of complicated diverticular disease with prior resection.  Last colonoscopy 2012 with Dr. Sharlett Iles.  Will be due around April 2022   PLAN:  1.  Reassurance 2.  Increase Nexium to twice daily for about 1 month and see if this helps with burning sensation 3.  Surveillance colonoscopy next year 4.  Complete your cardiac work-up as planned 5.  Return to the care of your PCP. Total time of 30 minutes was spent preparing to see the patient, reviewing test, obtaining history, performing comprehensive exam, counseling the patient regarding his above conditions, ordering medications, documenting information in the clinical health record

## 2020-05-24 NOTE — Patient Instructions (Signed)
We have sent the following medications to your pharmacy for you to pick up at your convenience:  Esomeprazole (increased to twice a day)  Please follow up as needed

## 2020-05-24 NOTE — Telephone Encounter (Signed)
Spoke to patient  Appt for 05/25/20 cancelled due to  CCTA not completed will reschedule  F/u after test is completed.

## 2020-05-25 ENCOUNTER — Ambulatory Visit: Payer: 59 | Admitting: Internal Medicine

## 2020-06-05 MED FILL — ESOMEPRAZOLE MAG DR 40 MG C: 40 | 30 days supply | Qty: 60 | Fill #0

## 2020-06-14 LAB — BASIC METABOLIC PANEL
BUN/Creatinine Ratio: 14 (ref 10–24)
BUN: 14 mg/dL (ref 8–27)
CO2: 26 mmol/L (ref 20–29)
Calcium: 9.5 mg/dL (ref 8.6–10.2)
Chloride: 105 mmol/L (ref 96–106)
Creatinine, Ser: 0.99 mg/dL (ref 0.76–1.27)
GFR calc Af Amer: 95 mL/min/{1.73_m2} (ref >59)
GFR calc non Af Amer: 82 mL/min/{1.73_m2} (ref >59)
Glucose: 108 mg/dL — ABNORMAL HIGH (ref 65–99)
Potassium: 4.3 mmol/L (ref 3.5–5.2)
Sodium: 143 mmol/L (ref 134–144)

## 2020-06-22 ENCOUNTER — Telehealth (HOSPITAL_COMMUNITY): Payer: Self-pay | Admitting: *Deleted

## 2020-06-22 NOTE — Telephone Encounter (Signed)

## 2020-06-26 ENCOUNTER — Other Ambulatory Visit: Payer: Self-pay

## 2020-06-26 ENCOUNTER — Ambulatory Visit (HOSPITAL_COMMUNITY)
Admission: RE | Admit: 2020-06-26 | Discharge: 2020-06-26 | Disposition: A | Discharge status: Home / Self Care | Payer: 59 | Source: Ambulatory Visit | Attending: Internal Medicine | Admitting: Internal Medicine

## 2020-06-26 ENCOUNTER — Encounter: Payer: 59 | Admitting: *Deleted

## 2020-06-26 DIAGNOSIS — R002 Palpitations: Secondary | ICD-10-CM

## 2020-06-26 DIAGNOSIS — Z006 Encounter for examination for normal comparison and control in clinical research program: Secondary | ICD-10-CM

## 2020-06-26 DIAGNOSIS — R030 Elevated blood-pressure reading, without diagnosis of hypertension: Secondary | ICD-10-CM | POA: Insufficient documentation

## 2020-06-26 DIAGNOSIS — I2510 Atherosclerotic heart disease of native coronary artery without angina pectoris: Secondary | ICD-10-CM

## 2020-06-26 MED ORDER — NITROGLYCERIN 0.4 MG SL SUBL
SUBLINGUAL_TABLET | SUBLINGUAL | Status: AC
  Filled 2020-06-26: qty 2

## 2020-06-26 MED ORDER — IOHEXOL 350 MG/ML SOLN
80.0000 mL | Freq: Once | INTRAVENOUS | Status: AC | PRN
  Administered 2020-06-26: 80 mL via INTRAVENOUS

## 2020-06-26 MED ORDER — NITROGLYCERIN 0.4 MG SL SUBL
0.8000 mg | SUBLINGUAL_TABLET | Freq: Once | SUBLINGUAL | Status: AC
  Administered 2020-06-26: 0.8 mg via SUBLINGUAL

## 2020-06-26 NOTE — Research (Signed)
Subject Name: Curtis Luna  Subject met inclusion and exclusion criteria.  The informed consent form, study requirements and expectations were reviewed with the subject and questions and concerns were addressed prior to the signing of the consent form.  The subject verbalized understanding of the trial requirements.  The subject agreed to participate in the CADFEM Group 4 trial and signed the informed consent at 0705 on 06/26/20  The informed consent was obtained prior to performance of any protocol-specific procedures for the subject.  A copy of the signed informed consent was given to the subject and a copy was placed in the subject's medical record.   [name]

## 2020-06-27 ENCOUNTER — Telehealth: Payer: Self-pay | Admitting: *Deleted

## 2020-06-27 DIAGNOSIS — I2510 Atherosclerotic heart disease of native coronary artery without angina pectoris: Secondary | ICD-10-CM | POA: Diagnosis not present

## 2020-06-27 NOTE — Telephone Encounter (Signed)
LEFT MESSAGE FOR PATIENT TO CALL BACK - NEED RESULT AND TO SET UP F/U APPT   POSSIBLE  July 29 VIRTUAL OR AUG 3 OR AUG 60 IN OFFICE

## 2020-06-27 NOTE — Telephone Encounter (Signed)
-----   Message from Elouise Munroe, MD sent at 06/27/2020  6:32 AM EDT ----- Two medium size plaques, but no significant impact on blood flow based on FFR results. We will discuss further recommendations for medical therapy at next follow up, please ensure follow up in next 4-6 weeks.

## 2020-06-27 NOTE — Telephone Encounter (Signed)
-----   Message from Elouise Munroe, MD sent at 06/27/2020  6:31 AM EDT ----- No significant stenosis. See CCTA result note for details.

## 2020-07-03 NOTE — Telephone Encounter (Signed)
Patient called back and schedule appointment °

## 2020-07-04 MED FILL — AMOX-CLAV 875-125 MG TABLET: 875-125 | 5 days supply | Qty: 10 | Fill #0

## 2020-07-12 ENCOUNTER — Encounter: Payer: Self-pay | Admitting: Internal Medicine

## 2020-07-12 ENCOUNTER — Telehealth (INDEPENDENT_AMBULATORY_CARE_PROVIDER_SITE_OTHER): Payer: 59 | Admitting: Internal Medicine

## 2020-07-12 ENCOUNTER — Telehealth: Payer: Self-pay | Admitting: *Deleted

## 2020-07-12 VITALS — BP 107/73 | HR 79 | Ht 65.0 in | Wt 147.0 lb

## 2020-07-12 DIAGNOSIS — I2510 Atherosclerotic heart disease of native coronary artery without angina pectoris: Secondary | ICD-10-CM | POA: Diagnosis not present

## 2020-07-12 DIAGNOSIS — E7800 Pure hypercholesterolemia, unspecified: Secondary | ICD-10-CM | POA: Diagnosis not present

## 2020-07-12 DIAGNOSIS — Z79899 Other long term (current) drug therapy: Secondary | ICD-10-CM

## 2020-07-12 DIAGNOSIS — R002 Palpitations: Secondary | ICD-10-CM

## 2020-07-12 MED ORDER — ROSUVASTATIN CALCIUM 20 MG PO TABS
20.0000 mg | ORAL_TABLET | Freq: Every day | ORAL | 3 refills | Status: DC
Start: 2020-07-12 — End: 2020-07-16

## 2020-07-12 MED ORDER — ASPIRIN EC 81 MG PO TBEC
81.0000 mg | DELAYED_RELEASE_TABLET | Freq: Every day | ORAL | 3 refills | Status: DC
Start: 2020-07-12 — End: 2022-08-21

## 2020-07-12 MED FILL — ROSUVASTATIN CALCIUM 20 MG: 20 | 30 days supply | Qty: 30 | Fill #0

## 2020-07-12 NOTE — Telephone Encounter (Signed)
The patient has been called about the virtual appointment today with Dr. Margaretann Loveless. Instructions provided. The AVS will be mailed. The patient verbalized their understanding.

## 2020-07-12 NOTE — Patient Instructions (Addendum)
Medication Instructions:  START Aspirin 81 mg once daily START Rosuvastatin 20 mg once daily STOP the Pravastatin  *If you need a refill on your cardiac medications before your next appointment, please call your pharmacy*   Lab Work: Your provider would like for you to return in 3 months to have the following labs drawn: fasting lipid and CMET. You do not need an appointment for the lab. Once in our office lobby there is a podium where you can sign in and ring the doorbell to alert [place] that you are here. The lab is open from 8:00 am to 4:30 pm; closed for lunch from 12:45pm-1:45pm.  If you have labs (blood work) drawn today and your tests are completely normal, you will receive your results only by:  [place] (if you have MyChart) OR  A paper copy in the mail If you have any lab test that is abnormal or we need to change your treatment, we will call you to review the results.   Testing/Procedures: None ordered   Follow-Up: At [facility], you and your health needs are our priority.  As part of our continuing mission to provide you with exceptional heart care, we have created designated Provider Care Teams.  These Care Teams include your primary Cardiologist (physician) and Advanced Practice Providers (APPs -  Physician Assistants and Nurse Practitioners) who all work together to provide you with the care you need, when you need it.  We recommend signing up for the patient portal called "MyChart".  Sign up information is provided on this After Visit Summary.  MyChart is used to connect with patients for Virtual Visits (Telemedicine).  Patients are able to view lab/test results, encounter notes, upcoming appointments, etc.  Non-urgent messages can be sent to your provider as well.   To learn more about what you can do with MyChart, go to [email or web address].    Your next appointment:   Follow up is 10/16/20 with Dr. Margaretann Loveless 8:40 am. This is a virtual appointment.

## 2020-07-12 NOTE — Progress Notes (Signed)
Virtual Visit via Telephone Note   This visit type was conducted due to national recommendations for restrictions regarding the COVID-19 Pandemic (e.g. social distancing) in an effort to limit this patient's exposure and mitigate transmission in our community.  Due to his co-morbid illnesses, this patient is at least at moderate risk for complications without adequate follow up.  This format is felt to be most appropriate for this patient at this time.  The patient did not have access to video technology/had technical difficulties with video requiring transitioning to audio format only (telephone).  All issues noted in this document were discussed and addressed.  No physical exam could be performed with this format.  Please refer to the patient's chart for his  consent to telehealth for [facility].    Date:  07/12/2020   ID:  Curtis Luna, DOB June 03, 1961, MRN [id number] The patient was identified using 2 identifiers.  Patient Location: Home Provider Location: Home Office  PCP:  Copland, Gay Filler, MD  Cardiologist:  Elouise Munroe, MD  Electrophysiologist:  None   Evaluation Performed:  Follow-Up Visit  Chief Complaint:  CAD, palpitations  History of Present Illness:    Curtis Luna is a 59 y.o. male well known to my clinic. Hx of ocular migraines, PACs, hyperlipidemia, GERD, thyroid nodules, who presents for follow-up.   Did not tolerate metoprolol due to bradycardia/hypotension.  Discussed results of CCTA showing moderate CAD, moderate CAC score 213. 79th percentile, discussed risk and secondary prevention of CAD. Tried doubling pravastatin but felt sluggish. Discussed high intensity statin therapy and adding ASA.   No current CP, SOB.    The patient does not have symptoms concerning for COVID-19 infection (fever, chills, cough, or new shortness of breath).    Past Medical History:  Diagnosis Date   Abdominal pain, right lower quadrant    Acute bronchospasm     Allergy    Anxiety    on and off   Backache, unspecified    Chicken pox    Diverticulosis of colon (without mention of hemorrhage)    Esophageal reflux    External hemorrhoids without mention of complication    thrombosed   Hiatal hernia    Hyperlipidemia    no medicines    Inguinal hernia without mention of obstruction or gangrene, unilateral or unspecified, (not specified as recurrent)    Migraine    Obstructive sleep apnea (adult) (pediatric)    Other symptoms involving digestive system(787.99)    Pain in joint, forearm    Pain in joint, shoulder region    Seasonal allergies    Sleep apnea    no cpap but uses dental device    Thoracic or lumbosacral neuritis or radiculitis, unspecified    Past Surgical History:  Procedure Laterality Date   COLON RESECTION  10/06   sigmoid colon   COLONOSCOPY     INGUINAL HERNIA REPAIR  12/2012   Left   UPPER GASTROINTESTINAL ENDOSCOPY     WISDOM TOOTH EXTRACTION       Current Meds  Medication Sig   b complex vitamins tablet Take 1 tablet by mouth daily. Every other day   celecoxib (CELEBREX) 200 MG capsule Take 200 mg by mouth daily as needed.   cholecalciferol (VITAMIN D3) 25 MCG (1000 UT) tablet Take 1,000 Units by mouth daily.   esomeprazole (NEXIUM) 40 MG capsule Take 1 capsule (40 mg total) by mouth in the morning and at bedtime.   gabapentin (NEURONTIN) 100 MG  capsule TAKE 1 CAPSULE BY MOUTH 3  TIMES DAILY   LORazepam (ATIVAN) 1 MG tablet Take 1 tablet (1 mg total) by mouth as needed for anxiety. May take 1 per day   Multiple Vitamin (MULTIVITAMIN) tablet Take 1 tablet by mouth daily. Men's 50 plus multivitamin daily   pravastatin (PRAVACHOL) 20 MG tablet TAKE 1 TABLET BY MOUTH  DAILY   Current Facility-Administered Medications for the 07/12/20 encounter (Telemedicine) with Elouise Munroe, MD  Medication   0.9 %  sodium chloride infusion     Allergies:   Escitalopram and Protonix  [pantoprazole sodium]   Social History   Tobacco Use   Smoking status: Former Smoker    Types: Cigarettes, Cigars    Quit date: 10/04/1987    Years since quitting: 32.7   Smokeless tobacco: Never Used  Vaping Use   Vaping Use: Never used  Substance Use Topics   Alcohol use: Yes    Alcohol/week: 7.0 - 14.0 standard drinks    Types: 7 - 14 Standard drinks or equivalent per week    Comment: 2 daily    Drug use: No     Family Hx: The patient's family history includes Allergies in his daughter; Aneurysm in his sister; Diabetes in his maternal aunt; Healthy in his brother; Heart attack in his paternal grandmother; Heart disease in his paternal grandmother; Lung cancer (age of onset: 78) in his mother; Migraines in his daughter; Other in his father. There is no history of Colon cancer, Colon polyps, Esophageal cancer, Rectal cancer, Stomach cancer, or Pancreatic cancer.  ROS:   Please see the history of present illness.     All other systems reviewed and are negative.   Prior CV studies:   The following studies were reviewed today:  Labs:  Sodium 143 normal K 4.3 normal Cr 0.99 normal   Labs/Other Tests and Data Reviewed:    EKG:  The patient's FITBIT ecg reviewed. NSR, rate 74 bpm.  Recent Labs: 04/19/2020: TSH 1.18 05/15/2020: ALT 24; Hemoglobin 15.2; Platelets 288 06/13/2020: BUN 14; Creatinine, Ser 0.99; Potassium 4.3; Sodium 143   Recent Lipid Panel Lab Results  Component Value Date/Time   CHOL 176 04/19/2020 10:53 AM   TRIG 104.0 04/19/2020 10:53 AM   HDL 47.00 04/19/2020 10:53 AM   CHOLHDL 4 04/19/2020 10:53 AM   LDLCALC 108 (H) 04/19/2020 10:53 AM   LDLDIRECT 146 (H) 10/17/2014 09:30 AM    Wt Readings from Last 3 Encounters:  07/12/20 147 lb (66.7 kg)  05/24/20 142 lb 6 oz (64.6 kg)  05/15/20 149 lb 14.6 oz (68 kg)     Objective:    Vital Signs:  BP 107/73    Pulse 79    Ht 5\' 5"  (1.651 m)    Wt 147 lb (66.7 kg)    BMI 24.46 kg/m    VITAL SIGNS:   reviewed GEN:  no acute distress RESPIRATORY:  normal respiratory effort, no increased work of breathing NEURO:  alert and oriented x 3, speech normal PSYCH:  normal affect   ASSESSMENT & PLAN:    1. Coronary artery disease involving native coronary artery of native heart without angina pectoris   2. Pure hypercholesterolemia   3. Palpitations   4. Medication management    CAD - moderate CAD and CAC on CCTA. Will intensify statin with Crestor 20 mg daily with CMP and lipids in 3 mo. Add ASA 81 mg daily. Secondary prevention of CAD.   Palpitations- observe, did not tolerate  metoprolol well. Monitor results low risk.   COVID-19 Education: The signs and symptoms of COVID-19 were discussed with the patient and how to seek care for testing (follow up with PCP or arrange E-visit).  The importance of social distancing was discussed today.  Time:   Today, I have spent 25 minutes with the patient with telehealth technology discussing the above problems.     Medication Adjustments/Labs and Tests Ordered: Current medicines are reviewed at length with the patient today.  Concerns regarding medicines are outlined above.  Patient Instructions  Medication Instructions:  START Aspirin 81 mg once daily START Rosuvastatin 20 mg once daily STOP the Pravastatin  *If you need a refill on your cardiac medications before your next appointment, please call your pharmacy*   Lab Work: Your provider would like for you to return in 3 months to have the following labs drawn: fasting lipid and CMET. You do not need an appointment for the lab. Once in our office lobby there is a podium where you can sign in and ring the doorbell to alert [place] that you are here. The lab is open from 8:00 am to 4:30 pm; closed for lunch from 12:45pm-1:45pm.  If you have labs (blood work) drawn today and your tests are completely normal, you will receive your results only by:  [place] (if you have MyChart) OR  A paper  copy in the mail If you have any lab test that is abnormal or we need to change your treatment, we will call you to review the results.   Testing/Procedures: None ordered   Follow-Up: At [facility], you and your health needs are our priority.  As part of our continuing mission to provide you with exceptional heart care, we have created designated Provider Care Teams.  These Care Teams include your primary Cardiologist (physician) and Advanced Practice Providers (APPs -  Physician Assistants and Nurse Practitioners) who all work together to provide you with the care you need, when you need it.  We recommend signing up for the patient portal called "MyChart".  Sign up information is provided on this After Visit Summary.  MyChart is used to connect with patients for Virtual Visits (Telemedicine).  Patients are able to view lab/test results, encounter notes, upcoming appointments, etc.  Non-urgent messages can be sent to your provider as well.   To learn more about what you can do with MyChart, go to [email or web address].    Your next appointment:   Follow up is 10/16/20 with Dr. Margaretann Loveless 8:40 am. This is a virtual appointment.       Signed, Elouise Munroe, MD  07/12/2020 8:27 AM    [place]

## 2020-07-16 ENCOUNTER — Other Ambulatory Visit: Payer: Self-pay

## 2020-07-16 MED ORDER — ROSUVASTATIN CALCIUM 20 MG PO TABS: 20.0000 mg | ORAL_TABLET | Freq: Every day | ORAL | 3 refills | Status: DC

## 2020-07-16 NOTE — Telephone Encounter (Signed)
Rx(s) sent to pharmacy electronically.  

## 2020-09-17 ENCOUNTER — Encounter: Payer: Self-pay | Admitting: Family Medicine

## 2020-09-17 MED FILL — ESOMEPRAZOLE MAG DR 40 MG C: 40 | 30 days supply | Qty: 60 | Fill #1

## 2020-09-19 ENCOUNTER — Encounter: Payer: Self-pay | Admitting: Family Medicine

## 2020-09-26 ENCOUNTER — Encounter: Payer: Self-pay | Admitting: Family Medicine

## 2020-10-02 NOTE — Progress Notes (Addendum)
[place] [facility] [address], [place], [place] [address] [phone]  Date:  10/03/2020   Name:  Curtis Luna   DOB:  29-Nov-1961   MRN:  [id number]  PCP:  Darreld Mclean, MD    Chief Complaint: Headache (4 to 5 months ) and Neck Pain   History of Present Illness:  Curtis Luna is a 59 y.o. very pleasant male patient who presents with the following:  Last visit together was in May for physical exam Patient today with concern of headaches-he contacted me recently following my chart message: Hi Dr. Lorelei Pont, I have had headaches for the last 2-3 months early AM before I wake up and sometimes into the day. I would say this happens 6 days a week. The headache is behind my eyes/nose and I thought it was just a sinus issue so I did an e-visit and the Dr. gave me an antibiotic for 5 days, but it continued. The only thing that I have done differently is that I have been taking rosuvastatin since July. Is it ok to stop taking it to see if the headaches go away? Or should I schedule an appt. to discuss further?  He tried pausing his crestor- did not make a difference as far as HA He does get some neck pain when he has the HA- he thinks this may have something to do with his headaches The ha will generally resolve once he gets up after a few hours No vomiting or vision change  He does have seasonal allergies and uses some medication for this  COVID-19 vaccine-?  Concerned about allergy Patient does not get flu shots due to a bad reaction which occurred a couple of years ago; seem to cause some neurologic symptoms which are still somewhat persistent. He did see neurology for further evaluation at that time Currently he describes what sounds like meralgia paresthetica of the left anterior thigh  Gabapentin 100 milligrams  3 times daily Crestor  His sister has history of cerebral aneurysm -she is a smoker however.  Patient himself has  been screened for cerebral aneurysm previously and been negative MRA of head 2016: IMPRESSION: Normal intracranial MR angiography of the large and medium size vessels. No berry aneurysm.  Pt also notes 3 years of difficulty with his bowels at night- he has to sleep on his RIGHT side or he will have gurgling and discomfort in his bowels, cramping and urge to have bowel movement.  He really is unable to lay on his left side due to the symptoms Last colon in 2012 He asked Dr Henrene Pastor about this in the past as well -they cannot explain this phenomenon either  Patient Active Problem List   Diagnosis Date Noted  . Toe pain, right 08/23/2018  . Pain of left heel 06/27/2018  . Multiple thyroid nodules 11/05/2015  . Peripheral neuropathy 04/18/2015  . Vertigo 04/15/2015  . FH: brain aneurysm 04/15/2015  . 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 Seasonal affective disorder ([place]) 10/02/2014  . Internal hemorrhoid 04/20/2014  . Chronic low back pain 04/20/2014  . Anxiety 04/20/2014  . Esophageal reflux 04/04/2011  . Diverticulosis of colon (without mention of hemorrhage) 04/04/2011    Past Medical History:  Diagnosis Date  . Abdominal pain, right lower quadrant   . Acute bronchospasm   . Allergy   . Anxiety    on and off  . Backache, unspecified   .  Chicken pox   . Diverticulosis of colon (without mention of hemorrhage)   . Esophageal reflux   . External hemorrhoids without mention of complication    thrombosed  . Hiatal hernia   . Hyperlipidemia    no medicines   . Inguinal hernia without mention of obstruction or gangrene, unilateral or unspecified, (not specified as recurrent)   . Migraine   . Obstructive sleep apnea (adult) (pediatric)   . Other symptoms involving digestive system(787.99)   . Pain in joint, forearm   . Pain in joint, shoulder region   . Seasonal allergies   . Sleep apnea    no cpap but uses dental device   . Thoracic or lumbosacral neuritis or  radiculitis, unspecified     Past Surgical History:  Procedure Laterality Date  . COLON RESECTION  10/06   sigmoid colon  . COLONOSCOPY    . INGUINAL HERNIA REPAIR  12/2012   Left  . UPPER GASTROINTESTINAL ENDOSCOPY    . WISDOM TOOTH EXTRACTION      Social History   Tobacco Use  . Smoking status: Former Smoker    Types: Cigarettes, Cigars    Quit date: 10/04/1987    Years since quitting: 33.0  . Smokeless tobacco: Never Used  Vaping Use  . Vaping Use: Never used  Substance Use Topics  . Alcohol use: Yes    Alcohol/week: 7.0 - 14.0 standard drinks    Types: 7 - 14 Standard drinks or equivalent per week    Comment: 2 daily   . Drug use: No    Family History  Problem Relation Age of Onset  . Other Father        MVA  . Lung cancer Mother 64       Deceased  . Diabetes Maternal Aunt   . Heart disease Paternal Grandmother   . Heart attack Paternal Grandmother   . Healthy Brother        x2  . Aneurysm Sister        Brain  . Allergies Daughter        x2  . Migraines Daughter        x1  . Colon cancer Neg Hx   . Colon polyps Neg Hx   . Esophageal cancer Neg Hx   . Rectal cancer Neg Hx   . Stomach cancer Neg Hx   . Pancreatic cancer Neg Hx     Allergies  Allergen Reactions  . Escitalopram Hives    Pt felt anxious  Pt felt his skin was "on fire"  . Protonix [Pantoprazole Sodium] Other (See Comments)    Tingling of extremities    Medication list has been reviewed and updated.  Current Outpatient Medications on File Prior to Visit  Medication Sig Dispense Refill  . aspirin EC 81 MG tablet Take 1 tablet (81 mg total) by mouth daily. Swallow whole. 90 tablet 3  . b complex vitamins tablet Take 1 tablet by mouth daily. Every other day    . celecoxib (CELEBREX) 200 MG capsule Take 200 mg by mouth daily as needed.    . cholecalciferol (VITAMIN D3) 25 MCG (1000 UT) tablet Take 1,000 Units by mouth daily.    [name] esomeprazole (NEXIUM) 40 MG capsule Take 1 capsule (40  mg total) by mouth in the morning and at bedtime. 180 capsule 3  . gabapentin (NEURONTIN) 100 MG capsule TAKE 1 CAPSULE BY MOUTH 3  TIMES DAILY 270 capsule 3  . LORazepam (ATIVAN) 1 MG  tablet Take 1 tablet (1 mg total) by mouth as needed for anxiety. May take 1 per day 30 tablet 0  . Multiple Vitamin (MULTIVITAMIN) tablet Take 1 tablet by mouth daily. Men's 50 plus multivitamin daily    . rosuvastatin (CRESTOR) 20 MG tablet Take 1 tablet (20 mg total) by mouth daily. 90 tablet 3   Current Facility-Administered Medications on File Prior to Visit  Medication Dose Route Frequency Provider Last Rate Last Admin  . 0.9 %  sodium chloride infusion  500 mL Intravenous Once Irene Shipper, MD        Review of Systems:  As per HPI- otherwise negative.   Physical Examination: Vitals:   10/03/20 0956  BP: 136/82  Pulse: 78  Resp: 16  Temp: 98.6 F (37 C)  SpO2: 96%   Vitals:   10/03/20 0956  Weight: 150 lb 9.6 oz (68.3 kg)  Height: 5\' 5"  (1.651 m)   Body mass index is 25.06 kg/m. Ideal Body Weight: Weight in (lb) to have BMI = 25: 149.9  GEN: no acute distress.  Looks well, normal weight HEENT: Atraumatic, Normocephalic.   Bilateral TM wnl, oropharynx normal.  PEERL,EOMI.   Ears and Nose: No external deformity. CV: RRR, No M/G/R. No JVD. No thrill. No extra heart sounds. PULM: CTA B, no wheezes, crackles, rhonchi. No retractions. No resp. distress. No accessory muscle use. ABD: S, NT, ND, +BS. No rebound. No HSM. EXTR: No c/c/e PSYCH: Normally interactive. Conversant.  Normal cervical ROM Pt has some tenderness in the paracervical muscles Normal strength, DTR of all limbs  Sensation is normal except for meralgia paresthetica symptoms left thigh  Assessment and Plan: Immunization reaction, sequela - Plan: Ambulatory referral to Allergy  Chronic bilateral lower abdominal pain - Plan: CBC, Comprehensive metabolic panel, CANCELED: CBC, CANCELED: Comprehensive metabolic  panel  Chronic nonintractable headache, unspecified headache type - Plan: DG Cervical Spine Complete, methocarbamol (ROBAXIN) 500 MG tablet  Neck pain - Plan: DG Cervical Spine Complete, methocarbamol (ROBAXIN) 500 MG tablet  Patient today with a couple of concerns He has a history of adverse reaction to flu shot a few years ago, he is concerned about getting the COVID-19 series.  I will refer him to allergy to discuss likelihood of reaction to COVID-19 and help him decide if he should get this immunization series  He notes chronic lower abdominal pain and GI discomfort which occurs if he attempts to lay on his left side.  This has been present for 2 or 3 years, so far we have not been able to find an explanation.  He is interested in having imaging to determine if this could be related to use of mesh he had placed for hernia repair.  We will check routine labs as above, assuming these look okay plan to proceed with CT abdomen pelvis  Patient notes chronic morning headaches for the last 2 or 3 months, occurring most days of the week.  He suspects this may actually be due to symptoms of the cervical spine.  We will obtain plain films of the C-spine, gave Robaxin for him to try at bedtime for a few days.  He will let me know how this works for him  Will plan further follow- up pending labs.  This visit occurred during the SARS-CoV-2 public health emergency.  Safety protocols were in place, including screening questions prior to the visit, additional usage of staff PPE, and extensive cleaning of exam room while observing appropriate contact time as  indicated for disinfecting solutions.    Signed Lamar Blinks, MD  Received labs and x-rays on 10/21- message to pt Results for orders placed or performed in visit on 10/03/20  CBC  Result Value Ref Range   WBC 6.1 3.8 - 10.8 Thousand/uL   RBC 4.63 4.20 - 5.80 Million/uL   Hemoglobin 14.9 13.2 - 17.1 g/dL   HCT 43.4 38 - 50 %   MCV 93.7 80.0 -  100.0 fL   MCH 32.2 27.0 - 33.0 pg   MCHC 34.3 32.0 - 36.0 g/dL   RDW 13.0 11.0 - 15.0 %   Platelets 297 140 - 400 Thousand/uL   MPV 9.5 7.5 - 12.5 fL  Comprehensive metabolic panel  Result Value Ref Range   Glucose, Bld 98 65 - 99 mg/dL   BUN 13 7 - 25 mg/dL   Creat 0.96 0.70 - 1.25 mg/dL   BUN/Creatinine Ratio NOT APPLICABLE 6 - 22 (calc)   Sodium 141 135 - 146 mmol/L   Potassium 4.9 3.5 - 5.3 mmol/L   Chloride 103 98 - 110 mmol/L   CO2 31 20 - 32 mmol/L   Calcium 10.1 8.6 - 10.3 mg/dL   Total Protein 6.9 6.1 - 8.1 g/dL   Albumin 4.6 3.6 - 5.1 g/dL   Globulin 2.3 1.9 - 3.7 g/dL (calc)   AG Ratio 2.0 1.0 - 2.5 (calc)   Total Bilirubin 0.4 0.2 - 1.2 mg/dL   Alkaline phosphatase (APISO) 46 35 - 144 U/L   AST 24 10 - 35 U/L   ALT 21 9 - 46 U/L   DG Cervical Spine Complete  Result Date: 10/04/2020 CLINICAL DATA:  Neck pain at night. EXAM: CERVICAL SPINE - COMPLETE 4+ VIEW COMPARISON:  06/24/2000 cervical spine radiographs report. 06/24/2000 MRI cervical spine report. FINDINGS: Straightening of lordosis. Grade 1 C7-T1 anterolisthesis. No focal osseous lesion. Multilevel endplate sclerosis and osteophytosis with mild disc space loss. Mild narrowing of the right bony C5-6 neural foramen. Soft tissues are unremarkable. IMPRESSION: Mild narrowing of the bony right C5-6 neural foramen. Multilevel spondylosis. No acute osseous abnormality. Electronically Signed   By: Primitivo Gauze M.D.   On: 10/04/2020 08:20

## 2020-10-03 ENCOUNTER — Other Ambulatory Visit: Payer: Self-pay

## 2020-10-03 ENCOUNTER — Encounter: Payer: Self-pay | Admitting: Family Medicine

## 2020-10-03 ENCOUNTER — Other Ambulatory Visit: Payer: Self-pay | Admitting: Family Medicine

## 2020-10-03 ENCOUNTER — Ambulatory Visit (INDEPENDENT_AMBULATORY_CARE_PROVIDER_SITE_OTHER): Payer: 59 | Admitting: Family Medicine

## 2020-10-03 ENCOUNTER — Ambulatory Visit (HOSPITAL_BASED_OUTPATIENT_CLINIC_OR_DEPARTMENT_OTHER)
Admission: RE | Admit: 2020-10-03 | Discharge: 2020-10-03 | Disposition: A | Discharge status: Home / Self Care | Payer: 59 | Source: Ambulatory Visit | Attending: Family Medicine | Admitting: Family Medicine

## 2020-10-03 VITALS — BP 136/82 | HR 78 | Temp 98.6°F | Resp 16 | Ht 65.0 in | Wt 150.6 lb

## 2020-10-03 DIAGNOSIS — M542 Cervicalgia: Secondary | ICD-10-CM | POA: Insufficient documentation

## 2020-10-03 DIAGNOSIS — T50Z95A Adverse effect of other vaccines and biological substances, initial encounter: Secondary | ICD-10-CM | POA: Diagnosis not present

## 2020-10-03 DIAGNOSIS — R1032 Left lower quadrant pain: Secondary | ICD-10-CM

## 2020-10-03 DIAGNOSIS — G8929 Other chronic pain: Secondary | ICD-10-CM

## 2020-10-03 DIAGNOSIS — T50Z95S Adverse effect of other vaccines and biological substances, sequela: Secondary | ICD-10-CM

## 2020-10-03 DIAGNOSIS — R519 Headache, unspecified: Secondary | ICD-10-CM | POA: Insufficient documentation

## 2020-10-03 DIAGNOSIS — R1031 Right lower quadrant pain: Secondary | ICD-10-CM

## 2020-10-03 MED ORDER — METHOCARBAMOL 500 MG PO TABS: 500.0000 mg | ORAL_TABLET | Freq: Every evening | ORAL | 0 refills | Status: DC | PRN

## 2020-10-03 MED FILL — METHOCARBAMOL 500 MG TABS: 500 | 10 days supply | Qty: 20 | Fill #0

## 2020-10-03 NOTE — Patient Instructions (Signed)
It was good to see you again today!   Please go to the ground floor imaging dept to have x-rays of your neck taken today Let's try robaxin- 1 or 2 tablets- at bedtime for a few days.  I would like to see if this help with your headache and neck pain.  We may consider imaging of your head if needed  For your chronic abdominal symptoms we will plan to do a CT of your abdomen and pelvis. We will get routine labs first (today) to make sure you are ok for contrast   Referral to allergist to discuss if you are safe to get the covid vaccine

## 2020-10-04 ENCOUNTER — Encounter: Payer: Self-pay | Admitting: Family Medicine

## 2020-10-04 DIAGNOSIS — R519 Headache, unspecified: Secondary | ICD-10-CM

## 2020-10-04 DIAGNOSIS — R911 Solitary pulmonary nodule: Secondary | ICD-10-CM

## 2020-10-04 DIAGNOSIS — G8929 Other chronic pain: Secondary | ICD-10-CM

## 2020-10-04 LAB — CBC
HCT: 43.4 % (ref 38.5–50.0)
Hemoglobin: 14.9 g/dL (ref 13.2–17.1)
MCH: 32.2 pg (ref 27.0–33.0)
MCHC: 34.3 g/dL (ref 32.0–36.0)
MCV: 93.7 fL (ref 80.0–100.0)
MPV: 9.5 fL (ref 7.5–12.5)
Platelets: 297 10*3/uL (ref 140–400)
RBC: 4.63 10*6/uL (ref 4.20–5.80)
RDW: 13 % (ref 11.0–15.0)
WBC: 6.1 10*3/uL (ref 3.8–10.8)

## 2020-10-04 LAB — COMPREHENSIVE METABOLIC PANEL
AG Ratio: 2 (calc) (ref 1.0–2.5)
ALT: 21 U/L (ref 9–46)
AST: 24 U/L (ref 10–35)
Albumin: 4.6 g/dL (ref 3.6–5.1)
Alkaline phosphatase (APISO): 46 U/L (ref 35–144)
BUN: 13 mg/dL (ref 7–25)
CO2: 31 mmol/L (ref 20–32)
Calcium: 10.1 mg/dL (ref 8.6–10.3)
Chloride: 103 mmol/L (ref 98–110)
Creat: 0.96 mg/dL (ref 0.70–1.25)
Globulin: 2.3 g/dL (calc) (ref 1.9–3.7)
Glucose, Bld: 98 mg/dL (ref 65–99)
Potassium: 4.9 mmol/L (ref 3.5–5.3)
Sodium: 141 mmol/L (ref 135–146)
Total Bilirubin: 0.4 mg/dL (ref 0.2–1.2)
Total Protein: 6.9 g/dL (ref 6.1–8.1)

## 2020-10-05 ENCOUNTER — Ambulatory Visit (HOSPITAL_BASED_OUTPATIENT_CLINIC_OR_DEPARTMENT_OTHER)
Admission: RE | Admit: 2020-10-05 | Discharge: 2020-10-05 | Disposition: A | Discharge status: Home / Self Care | Payer: 59 | Source: Ambulatory Visit | Attending: Family Medicine | Admitting: Family Medicine

## 2020-10-05 ENCOUNTER — Other Ambulatory Visit: Payer: Self-pay

## 2020-10-05 DIAGNOSIS — R1031 Right lower quadrant pain: Secondary | ICD-10-CM | POA: Diagnosis present

## 2020-10-05 DIAGNOSIS — R1032 Left lower quadrant pain: Secondary | ICD-10-CM | POA: Diagnosis present

## 2020-10-05 DIAGNOSIS — G8929 Other chronic pain: Secondary | ICD-10-CM | POA: Diagnosis present

## 2020-10-05 MED ORDER — IOHEXOL 300 MG/ML  SOLN
100.0000 mL | Freq: Once | INTRAMUSCULAR | Status: AC | PRN
  Administered 2020-10-05: 100 mL via INTRAVENOUS

## 2020-10-09 NOTE — Addendum Note (Signed)
Addended by: Darreld Mclean on: 10/09/2020 06:58 PM   Modules accepted: Orders

## 2020-10-09 NOTE — Telephone Encounter (Signed)
Discussed with radiology- they recommend MRI brain with and without  And MRA without contrast

## 2020-10-13 LAB — COMPREHENSIVE METABOLIC PANEL
ALT: 21 IU/L (ref 0–44)
AST: 26 IU/L (ref 0–40)
Albumin/Globulin Ratio: 2 (ref 1.2–2.2)
Albumin: 4.5 g/dL (ref 3.8–4.9)
Alkaline Phosphatase: 50 IU/L (ref 44–121)
BUN/Creatinine Ratio: 16 (ref 10–24)
BUN: 15 mg/dL (ref 8–27)
Bilirubin Total: 0.4 mg/dL (ref 0.0–1.2)
CO2: 24 mmol/L (ref 20–29)
Calcium: 9.6 mg/dL (ref 8.6–10.2)
Chloride: 103 mmol/L (ref 96–106)
Creatinine, Ser: 0.95 mg/dL (ref 0.76–1.27)
GFR calc Af Amer: 100 mL/min/{1.73_m2} (ref >59)
GFR calc non Af Amer: 87 mL/min/{1.73_m2} (ref >59)
Globulin, Total: 2.2 g/dL (ref 1.5–4.5)
Glucose: 90 mg/dL (ref 65–99)
Potassium: 4.5 mmol/L (ref 3.5–5.2)
Sodium: 138 mmol/L (ref 134–144)
Total Protein: 6.7 g/dL (ref 6.0–8.5)

## 2020-10-13 LAB — LIPID PANEL
Chol/HDL Ratio: 3 ratio (ref 0.0–5.0)
Cholesterol, Total: 154 mg/dL (ref 100–199)
HDL: 52 mg/dL (ref >39)
LDL Chol Calc (NIH): 83 mg/dL (ref 0–99)
Triglycerides: 106 mg/dL (ref 0–149)
VLDL Cholesterol Cal: 19 mg/dL (ref 5–40)

## 2020-10-16 ENCOUNTER — Telehealth: Payer: 59 | Admitting: Internal Medicine

## 2020-10-17 ENCOUNTER — Ambulatory Visit (HOSPITAL_BASED_OUTPATIENT_CLINIC_OR_DEPARTMENT_OTHER)
Admission: RE | Admit: 2020-10-17 | Discharge: 2020-10-17 | Disposition: A | Discharge status: Home / Self Care | Payer: 59 | Source: Ambulatory Visit | Attending: Family Medicine | Admitting: Family Medicine

## 2020-10-17 ENCOUNTER — Other Ambulatory Visit: Payer: Self-pay

## 2020-10-17 DIAGNOSIS — R911 Solitary pulmonary nodule: Secondary | ICD-10-CM | POA: Insufficient documentation

## 2020-10-19 ENCOUNTER — Encounter: Payer: Self-pay | Admitting: Family Medicine

## 2020-10-19 ENCOUNTER — Other Ambulatory Visit: Payer: Self-pay | Admitting: Family Medicine

## 2020-10-19 DIAGNOSIS — R911 Solitary pulmonary nodule: Secondary | ICD-10-CM

## 2020-10-19 NOTE — Progress Notes (Signed)
Received CT scan report, message to patient Order follow-up scan  IMPRESSION: 1. There is a 0.7 cm subsolid nodule in the right lower lobe which does not appear significantly changed compared to prior chest CT from 07/03/2020. Follow-up non-contrast CT recommended at 3-6 months to confirm persistence. If unchanged, and solid component remains <6 mm, annual CT is recommended until 5 years of stability has been established. If persistent these nodules should be considered highly suspicious if the solid component of the nodule is 6 mm or greater in size and enlarging. This recommendation follows the consensus statement: Guidelines for Management of Incidental Pulmonary Nodules Detected on CT Images: From the Fleischner Society 2017; Radiology 2017; 284:228-243. 2. Additional small bilateral pulmonary nodules measuring 3-4 mm. 3. Coronary artery and aortic atherosclerosis.  Aortic Atherosclerosis (ICD10-I70.0).

## 2020-10-22 MED FILL — ESOMEPRAZOLE MAG DR 40 MG C: 40 | 30 days supply | Qty: 60 | Fill #2

## 2020-10-30 ENCOUNTER — Telehealth (INDEPENDENT_AMBULATORY_CARE_PROVIDER_SITE_OTHER): Payer: 59 | Admitting: Internal Medicine

## 2020-10-30 ENCOUNTER — Encounter: Payer: Self-pay | Admitting: Internal Medicine

## 2020-10-30 VITALS — Ht 65.0 in | Wt 150.0 lb

## 2020-10-30 DIAGNOSIS — I2510 Atherosclerotic heart disease of native coronary artery without angina pectoris: Secondary | ICD-10-CM

## 2020-10-30 DIAGNOSIS — R002 Palpitations: Secondary | ICD-10-CM | POA: Diagnosis not present

## 2020-10-30 DIAGNOSIS — E7800 Pure hypercholesterolemia, unspecified: Secondary | ICD-10-CM | POA: Diagnosis not present

## 2020-10-30 DIAGNOSIS — Z79899 Other long term (current) drug therapy: Secondary | ICD-10-CM | POA: Diagnosis not present

## 2020-10-30 MED ORDER — ROSUVASTATIN CALCIUM 20 MG PO TABS: 20.0000 mg | ORAL_TABLET | Freq: Every day | ORAL | 3 refills | Status: DC

## 2020-10-30 NOTE — Progress Notes (Signed)
Virtual Visit via Telephone Note   This visit type was conducted due to national recommendations for restrictions regarding the COVID-19 Pandemic (e.g. social distancing) in an effort to limit this patient's exposure and mitigate transmission in our community.  Due to his co-morbid illnesses, this patient is at least at moderate risk for complications without adequate follow up.  This format is felt to be most appropriate for this patient at this time.  The patient did not have access to video technology/had technical difficulties with video requiring transitioning to audio format only (telephone).  All issues noted in this document were discussed and addressed.  No physical exam could be performed with this format.  Please refer to the patient's chart for his  consent to telehealth for [facility].    Date:  10/30/2020   ID:  Curtis Luna, DOB Apr 28, 1961, MRN [id number] The patient was identified using 2 identifiers.  Patient Location: Home Provider Location: Office/Clinic  PCP:  Darreld Mclean, MD  Cardiologist:  Elouise Munroe, MD  Electrophysiologist:  None   Evaluation Performed:  Follow-Up Visit  Chief Complaint:  Palpitations, CAD  History of Present Illness:    Curtis Luna is a 59 y.o. male with ocular migraines, PACs, hyperlipidemia, GERD, thyroid nodules, who presents for follow-up.    Did not tolerate metoprolol due to bradycardia/hypotension. palps came back for about a week, but went away on it's own.   CCTA showed moderate CAD, moderate CAC score 213. 79th percentile, discussed risk and secondary prevention of CAD. Tried doubling pravastatin but felt sluggish. Discussed high intensity statin therapy and adding ASA.    No current CP, SOB.   Bp 120-125/80  The patient does not have symptoms concerning for COVID-19 infection (fever, chills, cough, or new shortness of breath).    Past Medical History:  Diagnosis Date  . Abdominal pain, right lower  quadrant   . Acute bronchospasm   . Allergy   . Anxiety    on and off  . Backache, unspecified   . Chicken pox   . Diverticulosis of colon (without mention of hemorrhage)   . Esophageal reflux   . External hemorrhoids without mention of complication    thrombosed  . Hiatal hernia   . Hyperlipidemia    no medicines   . Inguinal hernia without mention of obstruction or gangrene, unilateral or unspecified, (not specified as recurrent)   . Migraine   . Obstructive sleep apnea (adult) (pediatric)   . Other symptoms involving digestive system(787.99)   . Pain in joint, forearm   . Pain in joint, shoulder region   . Seasonal allergies   . Sleep apnea    no cpap but uses dental device   . Thoracic or lumbosacral neuritis or radiculitis, unspecified    Past Surgical History:  Procedure Laterality Date  . COLON RESECTION  10/06   sigmoid colon  . COLONOSCOPY    . INGUINAL HERNIA REPAIR  12/2012   Left  . UPPER GASTROINTESTINAL ENDOSCOPY    . WISDOM TOOTH EXTRACTION       Current Meds  Medication Sig  . aspirin EC 81 MG tablet Take 1 tablet (81 mg total) by mouth daily. Swallow whole.  . b complex vitamins tablet Take 1 tablet by mouth daily. Every other day  . celecoxib (CELEBREX) 200 MG capsule Take 200 mg by mouth daily as needed.  . cholecalciferol (VITAMIN D3) 25 MCG (1000 UT) tablet Take 1,000 Units by mouth daily.  [name]  LORazepam (ATIVAN) 1 MG tablet Take 1 tablet (1 mg total) by mouth as needed for anxiety. May take 1 per day  . Multiple Vitamin (MULTIVITAMIN) tablet Take 1 tablet by mouth daily. Men's 50 plus multivitamin daily  . rosuvastatin (CRESTOR) 20 MG tablet Take 1 tablet (20 mg total) by mouth daily.  . [DISCONTINUED] esomeprazole (NEXIUM) 40 MG capsule Take 1 capsule (40 mg total) by mouth in the morning and at bedtime.  . [DISCONTINUED] gabapentin (NEURONTIN) 100 MG capsule TAKE 1 CAPSULE BY MOUTH 3  TIMES DAILY   Current Facility-Administered Medications for the  10/30/20 encounter (Telemedicine) with Elouise Munroe, MD  Medication  . 0.9 %  sodium chloride infusion     Allergies:   Escitalopram and Protonix [pantoprazole sodium]   Social History   Tobacco Use  . Smoking status: Former Smoker    Types: Cigarettes, Cigars    Quit date: 10/04/1987    Years since quitting: 33.0  . Smokeless tobacco: Never Used  Vaping Use  . Vaping Use: Never used  Substance Use Topics  . Alcohol use: Yes    Alcohol/week: 7.0 - 14.0 standard drinks    Types: 7 - 14 Standard drinks or equivalent per week    Comment: 2 daily   . Drug use: No     Family Hx: The patient's family history includes Allergies in his daughter; Aneurysm in his sister; Diabetes in his maternal aunt; Healthy in his brother; Heart attack in his paternal grandmother; Heart disease in his paternal grandmother; Lung cancer (age of onset: 106) in his mother; Migraines in his daughter; Other in his father. There is no history of Colon cancer, Colon polyps, Esophageal cancer, Rectal cancer, Stomach cancer, or Pancreatic cancer.  ROS:   Please see the history of present illness.     All other systems reviewed and are negative.   Prior CV studies:   The following studies were reviewed today:    Labs/Other Tests and Data Reviewed:    EKG:  No ECG reviewed.  Recent Labs: 04/19/2020: TSH 1.18 10/03/2020: Hemoglobin 14.9; Platelets 297 10/12/2020: ALT 21; BUN 15; Creatinine, Ser 0.95; Potassium 4.5; Sodium 138   Recent Lipid Panel Lab Results  Component Value Date/Time   CHOL 154 10/12/2020 09:02 AM   TRIG 106 10/12/2020 09:02 AM   HDL 52 10/12/2020 09:02 AM   CHOLHDL 3.0 10/12/2020 09:02 AM   CHOLHDL 4 04/19/2020 10:53 AM   LDLCALC 83 10/12/2020 09:02 AM   LDLDIRECT 146 (H) 10/17/2014 09:30 AM    Wt Readings from Last 3 Encounters:  10/03/20 150 lb 9.6 oz (68.3 kg)  07/12/20 147 lb (66.7 kg)  05/24/20 142 lb 6 oz (64.6 kg)     Risk Assessment/Calculations:       Objective:    Vital Signs:  Ht 5\' 5"  (1.651 m)   Wt 150 lb (68 kg)   BMI 24.96 kg/m    VITAL SIGNS:  reviewed GEN:  no acute distress RESPIRATORY:  normal respiratory effort, no increased work of breathing NEURO:  alert and oriented x 3, speech normal PSYCH:  normal affect   ASSESSMENT & PLAN:    1. Coronary artery disease involving native coronary artery of native heart without angina pectoris   2. Pure hypercholesterolemia   3. Palpitations   4. Medication management    CAD -  - continue ASA 81 mg daily and statin  HLD - lipids improved, LDL 83 on crestor 20 mg daily. Continue as tolerated.  Palpitations  - stable overall, continue to observe. Low risk cardiac monitor.  COVID-19 Education: The signs and symptoms of COVID-19 were discussed with the patient and how to seek care for testing (follow up with PCP or arrange E-visit).  The importance of social distancing was discussed today.  Time:   Today, I have spent 15 minutes with the patient with telehealth technology discussing the above problems.     Medication Adjustments/Labs and Tests Ordered: Current medicines are reviewed at length with the patient today.  Concerns regarding medicines are outlined above.   Patient Instructions  Medication Instructions:  No Changes In Medications at this time.  *If you need a refill on your cardiac medications before your next appointment, please call your pharmacy*  Follow-Up: At [facility], you and your health needs are our priority.  As part of our continuing mission to provide you with exceptional heart care, we have created designated Provider Care Teams.  These Care Teams include your primary Cardiologist (physician) and Advanced Practice Providers (APPs -  Physician Assistants and Nurse Practitioners) who all work together to provide you with the care you need, when you need it.  Your next appointment:   6 month(s)  The format for your next appointment:   In  Person  Provider:   Cherlynn Kaiser, MD     Signed, Elouise Munroe, MD  10/30/2020 7:52 AM    [facility]

## 2020-10-30 NOTE — Patient Instructions (Signed)

## 2020-11-12 ENCOUNTER — Encounter: Payer: Self-pay | Admitting: Allergy and Immunology

## 2020-11-12 ENCOUNTER — Other Ambulatory Visit: Payer: Self-pay

## 2020-11-12 ENCOUNTER — Ambulatory Visit (INDEPENDENT_AMBULATORY_CARE_PROVIDER_SITE_OTHER): Payer: 59 | Admitting: Allergy and Immunology

## 2020-11-12 DIAGNOSIS — H9313 Tinnitus, bilateral: Secondary | ICD-10-CM | POA: Diagnosis not present

## 2020-11-12 DIAGNOSIS — H8110 Benign paroxysmal vertigo, unspecified ear: Secondary | ICD-10-CM

## 2020-11-12 DIAGNOSIS — T8069XA Other serum reaction due to other serum, initial encounter: Secondary | ICD-10-CM | POA: Diagnosis not present

## 2020-11-12 NOTE — Assessment & Plan Note (Addendum)
The patient's history suggests adverse reaction to influenza injection.  Continue avoidance of influenza injection.  The CDC COVID-19 vaccine algorithm was discussed.

## 2020-11-12 NOTE — Patient Instructions (Signed)
Allergic reaction to vaccine The patient's history suggests adverse reaction to influenza injection.  Continue avoidance of influenza injection.  The CDC COVID-19 vaccine algorithm was discussed.   Follow-up if needed.

## 2020-11-12 NOTE — Progress Notes (Signed)
New Patient Note  RE: Curtis Luna MRN: [id number] DOB: February 03, 1961 Date of Office Visit: 11/12/2020  Referring provider: Darreld Mclean, MD Primary care provider: Darreld Mclean, MD  Chief Complaint: Allergic Reaction   History of present illness: Curtis Luna is a 59 y.o. male seen today in consultation requested by Lamar Blinks, MD.  He reports that in 2014 he had a flu injection and within 1-2 weeks began to develop neurovestibular symptoms, including vertigo and persistent tinnitus.  Vestibular testing, including the Epley maneuver have been unable to identify a specific etiology.  The untoward symptoms have improved somewhat over time, however have persisted.  He has no history of egg allergy.  He has intentionally not had the flu vaccine since that time and is concerned that the COVID-19 vaccines may exacerbate the neurovestibular symptoms.  Assessment and plan: Allergic reaction to vaccine The patient's history suggests adverse reaction to influenza injection.  Continue avoidance of influenza injection.  The CDC COVID-19 vaccine algorithm was discussed.   Diagnostics: None.  Physical examination: Blood pressure 140/82, pulse 74, temperature 98.4 F (36.9 C), temperature source Oral, resp. rate 20, height 5' 4.45" (1.637 m), weight 152 lb 6.4 oz (69.1 kg), SpO2 97 %.  General: Alert, interactive, in no acute distress. Neck: Supple without lymphadenopathy. Lungs: Clear to auscultation without wheezing, rhonchi or rales. CV: Normal S1, S2 without murmurs. Abdomen: Nondistended, nontender. Skin: Warm and dry, without lesions or rashes. Extremities:  No clubbing, cyanosis or edema. Neuro:   Grossly intact.  Review of systems:  Review of systems negative except as noted in HPI / PMHx or noted below: Review of Systems  Constitutional: Negative.   HENT: Negative.   Eyes: Negative.   Respiratory: Negative.   Cardiovascular: Negative.   Gastrointestinal:  Negative.   Genitourinary: Negative.   Musculoskeletal: Negative.   Skin: Negative.   Neurological: Negative.   Endo/Heme/Allergies: Negative.   Psychiatric/Behavioral: Negative.     Past medical history:  Past Medical History:  Diagnosis Date  . Abdominal pain, right lower quadrant   . Acute bronchospasm   . Allergy   . Anxiety    on and off  . Backache, unspecified   . Chicken pox   . Diverticulosis of colon (without mention of hemorrhage)   . Esophageal reflux   . External hemorrhoids without mention of complication    thrombosed  . Hiatal hernia   . Hyperlipidemia    no medicines   . Inguinal hernia without mention of obstruction or gangrene, unilateral or unspecified, (not specified as recurrent)   . Migraine   . Obstructive sleep apnea (adult) (pediatric)   . Other symptoms involving digestive system(787.99)   . Pain in joint, forearm   . Pain in joint, shoulder region   . Seasonal allergies   . Sleep apnea    no cpap but uses dental device   . Thoracic or lumbosacral neuritis or radiculitis, unspecified     Past surgical history:  Past Surgical History:  Procedure Laterality Date  . COLON RESECTION  10/06   sigmoid colon  . COLONOSCOPY    . INGUINAL HERNIA REPAIR  12/2012   Left  . UPPER GASTROINTESTINAL ENDOSCOPY    . WISDOM TOOTH EXTRACTION      Family history: Family History  Problem Relation Age of Onset  . Other Father        MVA  . Lung cancer Mother 39       Deceased  . Diabetes  Maternal Aunt   . Heart disease Paternal Grandmother   . Heart attack Paternal Grandmother   . Healthy Brother        x2  . Aneurysm Sister        Brain  . Allergies Daughter        x2  . Migraines Daughter        x1  . Colon cancer Neg Hx   . Colon polyps Neg Hx   . Esophageal cancer Neg Hx   . Rectal cancer Neg Hx   . Stomach cancer Neg Hx   . Pancreatic cancer Neg Hx   . Allergic rhinitis Neg Hx   . Angioedema Neg Hx   . Asthma Neg Hx   . Atopy Neg  Hx   . Eczema Neg Hx   . Immunodeficiency Neg Hx   . Urticaria Neg Hx     Social history: Social History   Socioeconomic History  . Marital status: Married    Spouse name: Not on file  . Number of children: Not on file  . Years of education: Not on file  . Highest education level: Not on file  Occupational History  . Occupation: Technical sales engineer   Tobacco Use  . Smoking status: Former Smoker    Types: Cigarettes, Cigars    Quit date: 10/04/1987    Years since quitting: 33.1  . Smokeless tobacco: Never Used  Vaping Use  . Vaping Use: Never used  Substance and Sexual Activity  . Alcohol use: Yes    Alcohol/week: 7.0 - 14.0 standard drinks    Types: 7 - 14 Standard drinks or equivalent per week    Comment: 2 daily   . Drug use: No  . Sexual activity: Not on file  Other Topics Concern  . Not on file  Social History Narrative   1 caffeine drink daily  Lives with wife in a one story home.  Has 2 children.  Works in Estate manager/land agent.   Social Determinants of Health   Financial Resource Strain:   . Difficulty of Paying Living Expenses: Not on file  Food Insecurity:   . Worried About Charity fundraiser in the Last Year: Not on file  . Ran Out of Food in the Last Year: Not on file  Transportation Needs:   . Lack of Transportation (Medical): Not on file  . Lack of Transportation (Non-Medical): Not on file  Physical Activity:   . Days of Exercise per Week: Not on file  . Minutes of Exercise per Session: Not on file  Stress:   . Feeling of Stress : Not on file  Social Connections:   . Frequency of Communication with Friends and Family: Not on file  . Frequency of Social Gatherings with Friends and Family: Not on file  . Attends Religious Services: Not on file  . Active Member of Clubs or Organizations: Not on file  . Attends Archivist Meetings: Not on file  . Marital Status: Not on file  Intimate Partner Violence:   . Fear of Current or Ex-Partner:  Not on file  . Emotionally Abused: Not on file  . Physically Abused: Not on file  . Sexually Abused: Not on file    Environmental History: The patient lives in a 60 year old house with carpeting throughout and central air/heat.  There is no known mold/water damage in the home.  There are 3 dogs in the home.  He is a former cigarette smoker having quit in 1988.  Current Outpatient Medications  Medication Sig Dispense Refill  . Ascorbic Acid (VITAMIN C) 1000 MG tablet Take 1,000 mg by mouth daily.    [name] aspirin EC 81 MG tablet Take 1 tablet (81 mg total) by mouth daily. Swallow whole. 90 tablet 3  . b complex vitamins tablet Take 1 tablet by mouth daily. Every other day    . celecoxib (CELEBREX) 200 MG capsule Take 200 mg by mouth daily as needed.    . cholecalciferol (VITAMIN D3) 25 MCG (1000 UT) tablet Take 1,000 Units by mouth daily.    [name] esomeprazole (NEXIUM) 40 MG capsule Take 1 capsule (40 mg total) by mouth in the morning and at bedtime. 180 capsule 3  . gabapentin (NEURONTIN) 100 MG capsule TAKE 1 CAPSULE BY MOUTH 3  TIMES DAILY 270 capsule 3  . LORazepam (ATIVAN) 1 MG tablet Take 1 tablet (1 mg total) by mouth as needed for anxiety. May take 1 per day 30 tablet 0  . Multiple Vitamin (MULTIVITAMIN) tablet Take 1 tablet by mouth daily. Men's 50 plus multivitamin daily    . rosuvastatin (CRESTOR) 20 MG tablet Take 1 tablet (20 mg total) by mouth daily. 90 tablet 3   Current Facility-Administered Medications  Medication Dose Route Frequency Provider Last Rate Last Admin  . 0.9 %  sodium chloride infusion  500 mL Intravenous Once Irene Shipper, MD        Known medication allergies: Allergies  Allergen Reactions  . Escitalopram Hives    Pt felt anxious  Pt felt his skin was "on fire"  . Protonix [Pantoprazole Sodium] Other (See Comments)    Tingling of extremities    I appreciate the opportunity to take part in Jaycion's care. Please do not hesitate to contact me with  questions.  Sincerely,   R. Edgar Frisk, MD

## 2020-11-19 MED FILL — ESOMEPRAZOLE MAG DR 40 MG C: 40 | 30 days supply | Qty: 60 | Fill #3 | Status: TO

## 2020-11-28 IMAGING — CR DG CHEST 2V
2 series · 2 of 2 positions shown · non-contrast
Comparison: Prior chest radiograph 11/21/2019

CLINICAL DATA: Palpitations. Additional provided: Palpitations for
2 days with chest pressure.

EXAM:
CHEST - 2 VIEW

[w chest pa]
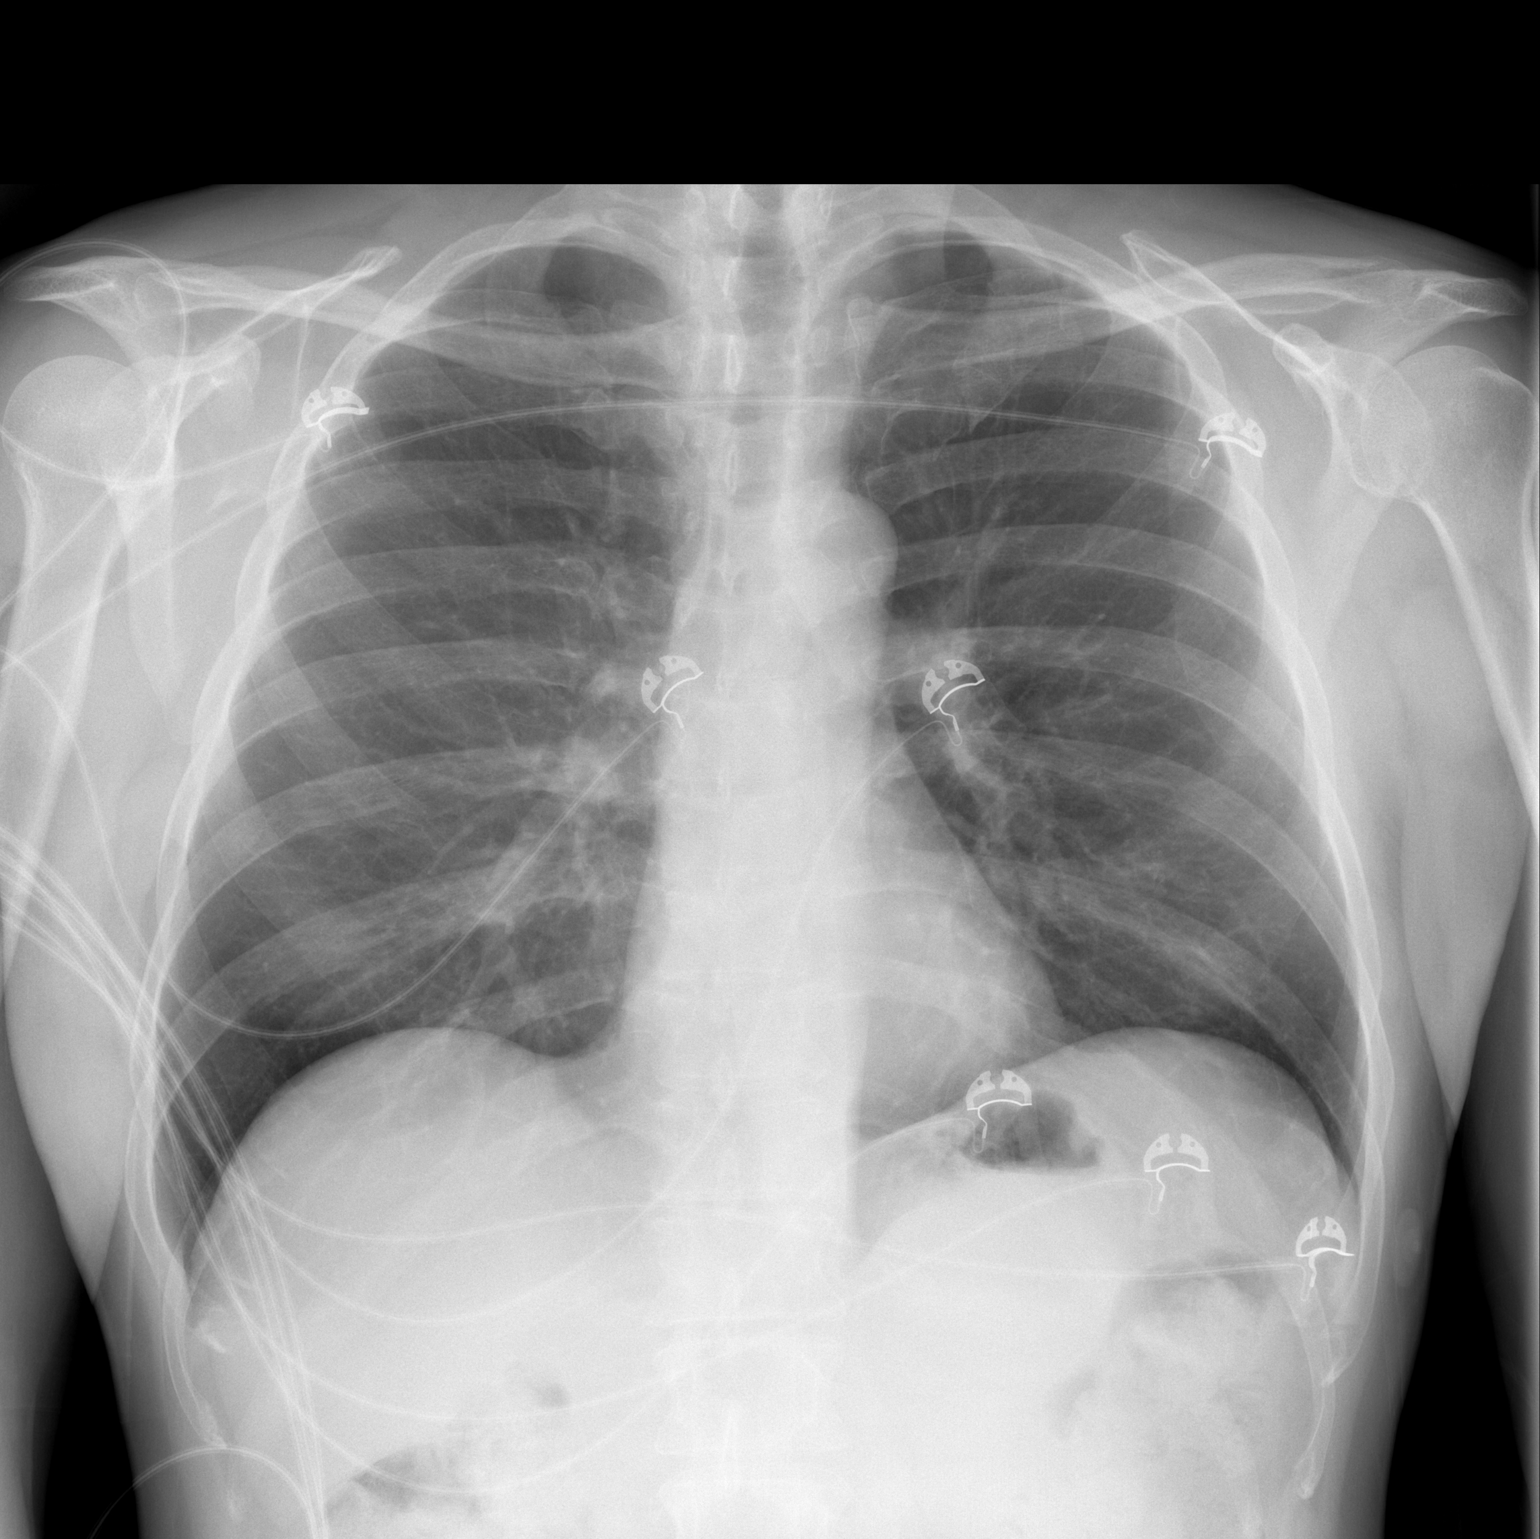

[w chest lat]
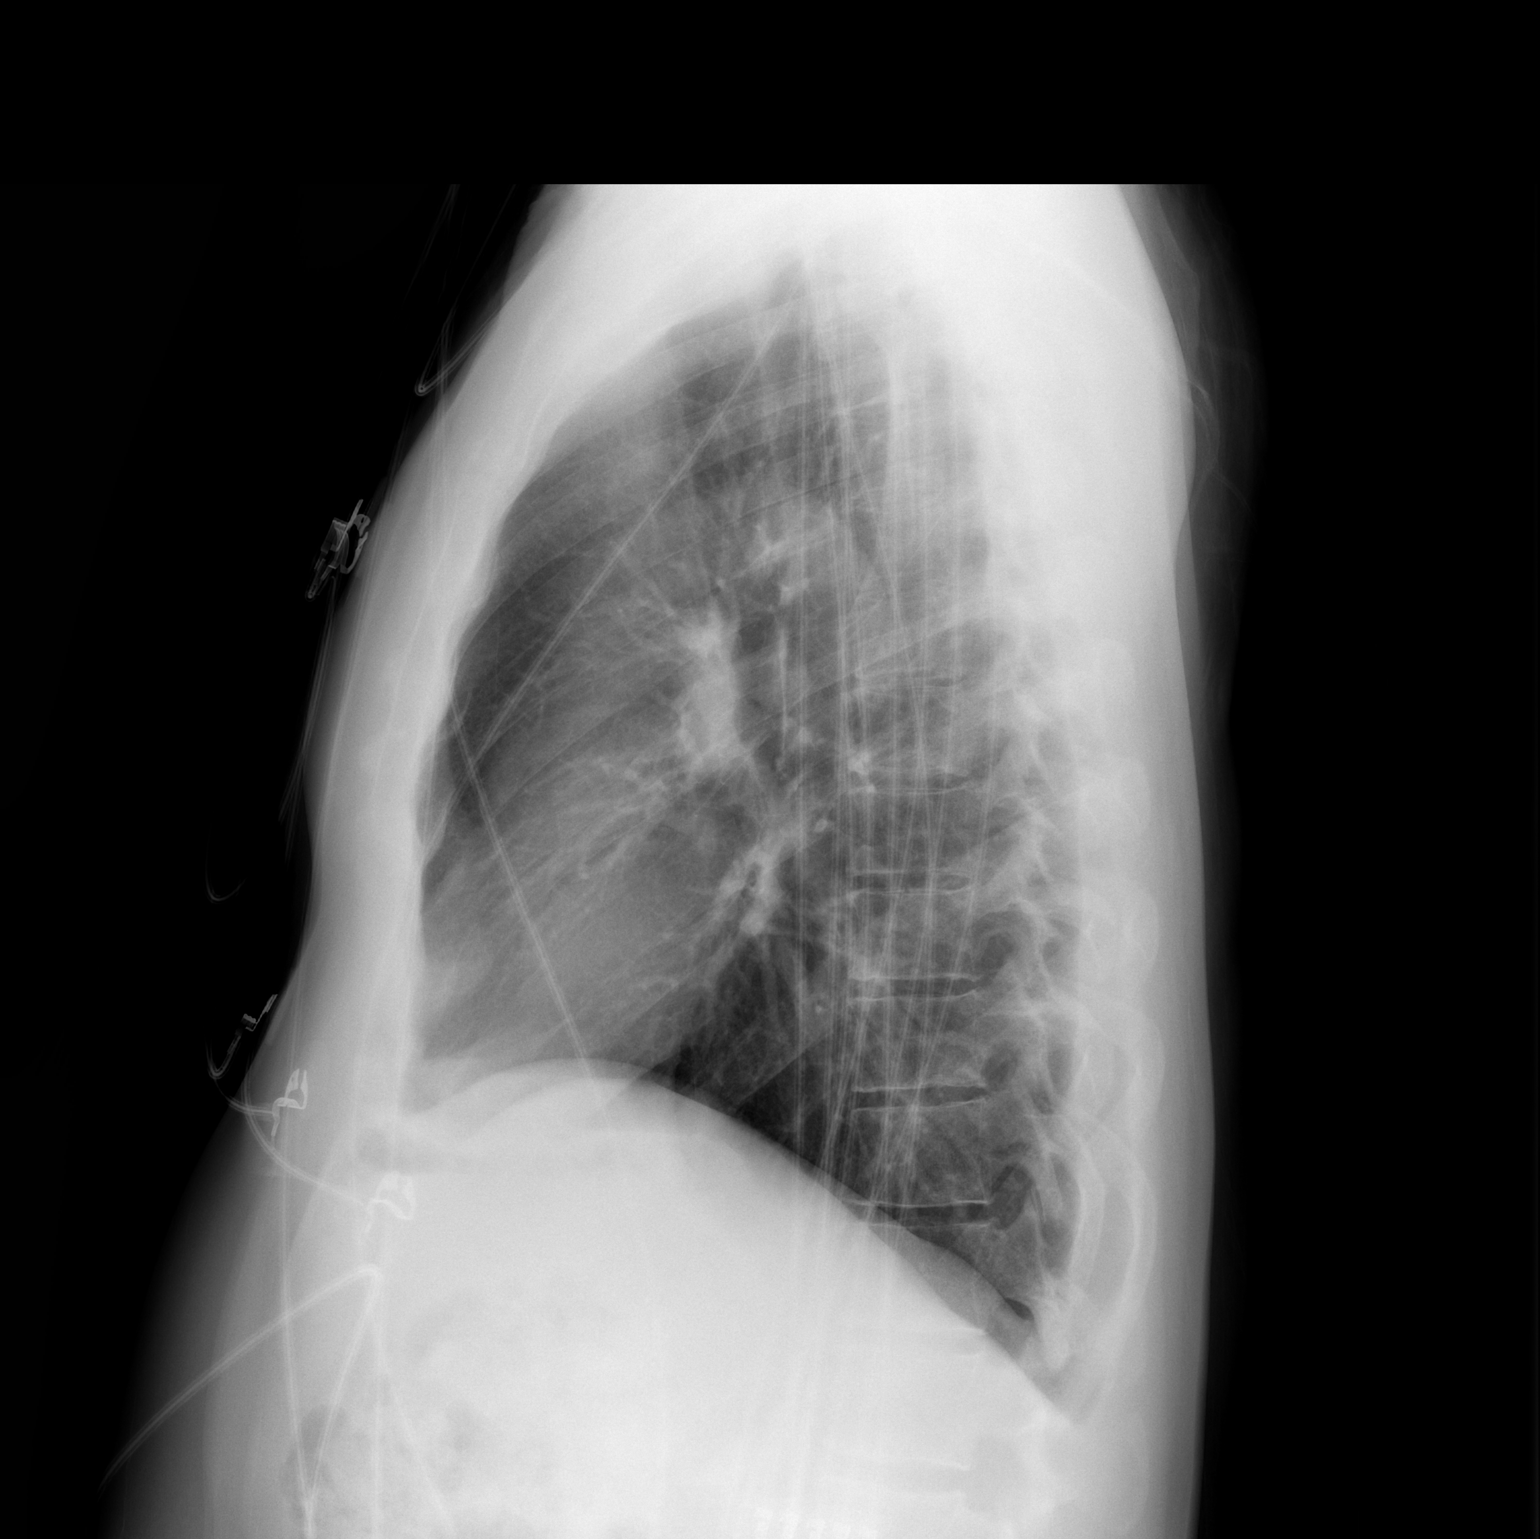

[2 of 2 positions shown; findings below may reference images not displayed]

FINDINGS: Heart size within normal limits.

There is no appreciable airspace consolidation.

No evidence of pleural effusion or pneumothorax.

No acute bony abnormality identified.
IMPRESSION: No evidence of acute cardiopulmonary abnormality.

## 2020-12-04 ENCOUNTER — Other Ambulatory Visit: Payer: Self-pay | Admitting: Family Medicine

## 2020-12-04 DIAGNOSIS — G629 Polyneuropathy, unspecified: Secondary | ICD-10-CM

## 2021-01-07 ENCOUNTER — Other Ambulatory Visit: Payer: Self-pay

## 2021-01-07 MED ORDER — ESOMEPRAZOLE MAGNESIUM 40 MG PO CPDR: 40.0000 mg | DELAYED_RELEASE_CAPSULE | Freq: Two times a day (BID) | ORAL | 3 refills | Status: DC

## 2021-01-10 ENCOUNTER — Telehealth: Payer: Self-pay | Admitting: Internal Medicine

## 2021-01-10 NOTE — Telephone Encounter (Signed)
Inbound call from pharmacy stating they have not received prescription for Nexium medication.

## 2021-01-11 NOTE — Telephone Encounter (Signed)
Saw you sent this  - wasn't sure if I needed to do anything.Marland KitchenMarland Kitchen

## 2021-01-11 NOTE — Telephone Encounter (Signed)
Not that I know of, it was sent electronically.

## 2021-01-14 ENCOUNTER — Other Ambulatory Visit: Payer: Self-pay

## 2021-01-14 MED ORDER — ESOMEPRAZOLE MAGNESIUM 40 MG PO CPDR: 40.0000 mg | DELAYED_RELEASE_CAPSULE | Freq: Two times a day (BID) | ORAL | 3 refills | Status: DC

## 2021-01-21 ENCOUNTER — Encounter: Payer: Self-pay | Admitting: Family Medicine

## 2021-02-16 NOTE — Progress Notes (Addendum)
[facility] [address] [place], [place] [address] [phone]  Date:  02/18/2021   Name:  Curtis Luna   DOB:  09/25/1961   MRN:  [id number]  PCP:  Darreld Mclean, MD    Chief Complaint: Annual Exam   History of Present Illness:  Curtis Luna is a 60 y.o. very pleasant male patient who presents with the following:  Pt here today for a CPE-history of peripheral neuropathy, reflux, diverticulosis, CAD, ocular migraine headache, hyperlipidemia  Last seen by myself in October when he was having some difficulty with headaches He notes that he continues to get some headaches and neckaches- in the back of the neck. Will wax and wane Taking ativan seems to help for a while We did plain films of his neck and an MRI/A of his head in the fall-  MRI and MRA were done at [facility]- reports are printed out today and reviewed.  Advised the patient that these results were apparently never sent to me.  Results are normal In future, if he does not hear from me about a result please reach out, because out of system I never saw this MRI reports  Colon cancer screening due this year- he plans to do a colonoscopy.  He plans to schedule soon Shingrix COVID-19 vaccine-at last visit he had not gotten his due to concern about possible allergy He decided not to be vaccinated Flu vaccine-history of allergic reaction CMP, lipids, CBC completed in October Can update PSA today  I referred him to allergist to discuss his concerns about COVID-19 vaccine-it does not look like they felt he would need to be concerned about COVID-19 vaccine.  He also followed up with cardiology in November 1. Coronary artery disease involving native coronary artery of native heart without angina pectoris   2. Pure hypercholesterolemia   3. Palpitations   4. Medication management   CAD -  - continue ASA 81 mg daily and statin HLD - lipids improved, LDL 83 on  crestor 20 mg daily. Continue as tolerated. Palpitations  - stable overall, continue to observe. Low risk cardiac monitor   Patient Active Problem List   Diagnosis Date Noted  . Allergic reaction to vaccine 11/12/2020  . Toe pain, right 08/23/2018  . Pain of left heel 06/27/2018  . Multiple thyroid nodules 11/05/2015  . Peripheral neuropathy 04/18/2015  . Vertigo 04/15/2015  . FH: brain aneurysm 04/15/2015  . 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 Seasonal affective disorder ([place]) 10/02/2014  . Internal hemorrhoid 04/20/2014  . Chronic low back pain 04/20/2014  . Anxiety 04/20/2014  . Esophageal reflux 04/04/2011  . Diverticulosis of colon (without mention of hemorrhage) 04/04/2011    Past Medical History:  Diagnosis Date  . Abdominal pain, right lower quadrant   . Acute bronchospasm   . Allergy   . Anxiety    on and off  . Backache, unspecified   . Chicken pox   . Diverticulosis of colon (without mention of hemorrhage)   . Esophageal reflux   . External hemorrhoids without mention of complication    thrombosed  . Hiatal hernia   . Hyperlipidemia    no medicines   . Inguinal hernia without mention of obstruction or gangrene, unilateral or unspecified, (not specified as recurrent)   . Migraine   . Obstructive sleep apnea (adult) (pediatric)   . Other symptoms involving digestive system(787.99)   . Pain  in joint, forearm   . Pain in joint, shoulder region   . Seasonal allergies   . Sleep apnea    no cpap but uses dental device   . Thoracic or lumbosacral neuritis or radiculitis, unspecified     Past Surgical History:  Procedure Laterality Date  . COLON RESECTION  10/06   sigmoid colon  . COLONOSCOPY    . INGUINAL HERNIA REPAIR  12/2012   Left  . UPPER GASTROINTESTINAL ENDOSCOPY    . WISDOM TOOTH EXTRACTION      Social History   Tobacco Use  . Smoking status: Former Smoker    Types: Cigarettes, Cigars    Quit date: 10/04/1987    Years  since quitting: 33.4  . Smokeless tobacco: Never Used  Vaping Use  . Vaping Use: Never used  Substance Use Topics  . Alcohol use: Yes    Alcohol/week: 7.0 - 14.0 standard drinks    Types: 7 - 14 Standard drinks or equivalent per week    Comment: 2 daily   . Drug use: No    Family History  Problem Relation Age of Onset  . Other Father        MVA  . Lung cancer Mother 66       Deceased  . Diabetes Maternal Aunt   . Heart disease Paternal Grandmother   . Heart attack Paternal Grandmother   . Healthy Brother        x2  . Aneurysm Sister        Brain  . Allergies Daughter        x2  . Migraines Daughter        x1  . Colon cancer Neg Hx   . Colon polyps Neg Hx   . Esophageal cancer Neg Hx   . Rectal cancer Neg Hx   . Stomach cancer Neg Hx   . Pancreatic cancer Neg Hx   . Allergic rhinitis Neg Hx   . Angioedema Neg Hx   . Asthma Neg Hx   . Atopy Neg Hx   . Eczema Neg Hx   . Immunodeficiency Neg Hx   . Urticaria Neg Hx     Allergies  Allergen Reactions  . Escitalopram Hives    Pt felt anxious  Pt felt his skin was "on fire"  . Protonix [Pantoprazole Sodium] Other (See Comments)    Tingling of extremities    Medication list has been reviewed and updated.  Current Outpatient Medications on File Prior to Visit  Medication Sig Dispense Refill  . Ascorbic Acid (VITAMIN C) 1000 MG tablet Take 1,000 mg by mouth daily.    [name] aspirin EC 81 MG tablet Take 1 tablet (81 mg total) by mouth daily. Swallow whole. 90 tablet 3  . b complex vitamins tablet Take 1 tablet by mouth daily. Every other day    . celecoxib (CELEBREX) 200 MG capsule Take 200 mg by mouth daily as needed.    . cholecalciferol (VITAMIN D3) 25 MCG (1000 UT) tablet Take 1,000 Units by mouth daily.    [name] esomeprazole (NEXIUM) 40 MG capsule Take 1 capsule (40 mg total) by mouth in the morning and at bedtime. 180 capsule 3  . gabapentin (NEURONTIN) 100 MG capsule TAKE 1 CAPSULE BY MOUTH 3  TIMES DAILY 270 capsule  3  . Multiple Vitamin (MULTIVITAMIN) tablet Take 1 tablet by mouth daily. Men's 50 plus multivitamin daily    . rosuvastatin (CRESTOR) 20 MG tablet Take 1 tablet (20 mg  total) by mouth daily. 90 tablet 3   Current Facility-Administered Medications on File Prior to Visit  Medication Dose Route Frequency Provider Last Rate Last Admin  . 0.9 %  sodium chloride infusion  500 mL Intravenous Once Irene Shipper, MD        Review of Systems:  As per HPI- otherwise negative.   Physical Examination: Vitals:   02/18/21 0821  BP: 132/80  Pulse: 67  Temp: 97.6 F (36.4 C)  SpO2: 96%   Vitals:   02/18/21 0821  Weight: 150 lb (68 kg)  Height: 5\' 4"  (1.626 m)   Body mass index is 25.75 kg/m. Ideal Body Weight: Weight in (lb) to have BMI = 25: 145.3  GEN: no acute distress.  Normal weight, looks well  HEENT: Atraumatic, Normocephalic.  Ears and Nose: No external deformity. CV: RRR, No M/G/R. No JVD. No thrill. No extra heart sounds. PULM: CTA B, no wheezes, crackles, rhonchi. No retractions. No resp. distress. No accessory muscle use. ABD: S, NT, ND, +BS. No rebound. No HSM. EXTR: No c/c/e PSYCH: Normally interactive. Conversant.  Neck exam: Excellent range of motion, no bony tenderness.  Normal bilateral upper extremity strength and sensation.  He does have some discomfort in the paracervical spinous muscles  Assessment and Plan: Physical exam  Vitamin D deficiency - Plan: VITAMIN D 25 Hydroxy (Vit-D Deficiency, Fractures)  Chronic nonintractable headache, unspecified headache type  Screening for prostate cancer - Plan: PSA  Exposure to COVID-19 virus - Plan: SAR CoV2 Serology (COVID 19)AB(IGG)IA  Anxiety - Plan: LORazepam (ATIVAN) 1 MG tablet  Neck pain - Plan: Ambulatory referral to Orthopedic Surgery  Screening for diabetes mellitus - Plan: Hemoglobin V3X, Basic metabolic panel  Dyslipidemia - Plan: Lipid panel  Patient today for a physical exam and a couple of other  concerns Labs are pending as above Refilled lorazepam which he uses for anxiety as needed-he has not filled in a few years Referral to nonoperative spine to discuss his neck issues We will do a COVID-19 antibody titer, this may help guide urgency for possible immunization Will plan further follow- up pending labs.  This visit occurred during the SARS-CoV-2 public health emergency.  Safety protocols were in place, including screening questions prior to the visit, additional usage of staff PPE, and extensive cleaning of exam room while observing appropriate contact time as indicated for disinfecting solutions.    Signed Lamar Blinks, MD  Received his labs as below- message to pt and completed form  Results for orders placed or performed in visit on 02/18/21  PSA  Result Value Ref Range   PSA 2.73 0.10 - 4.00 ng/mL  VITAMIN D 25 Hydroxy (Vit-D Deficiency, Fractures)  Result Value Ref Range   VITD 70.54 30.00 - 100.00 ng/mL  Hemoglobin A1c  Result Value Ref Range   Hgb A1c MFr Bld 5.8 4.6 - 6.5 %  Lipid panel  Result Value Ref Range   Cholesterol 125 0 - 200 mg/dL   Triglycerides 76.0 0.0 - 149.0 mg/dL   HDL 49.50 >39.00 mg/dL   VLDL 15.2 0.0 - 40.0 mg/dL   LDL Cholesterol 61 0 - 99 mg/dL   Total CHOL/HDL Ratio 3    NonHDL 10.62   Basic metabolic panel  Result Value Ref Range   Sodium 140 135 - 145 mEq/L   Potassium 4.7 3.5 - 5.1 mEq/L   Chloride 104 96 - 112 mEq/L   CO2 30 19 - 32 mEq/L   Glucose, Bld 90  70 - 99 mg/dL   BUN 17 6 - 23 mg/dL   Creatinine, Ser 0.99 0.40 - 1.50 mg/dL   GFR 82.65 >60.00 mL/min   Calcium 9.6 8.4 - 10.5 mg/dL   Lab Results  Component Value Date   PSA 2.73 02/18/2021   PSA 0.78 04/19/2020   PSA 0.77 07/25/2019    Update 3/8, received his COVID-19 antibody test as below, message to patient   Ref Range & Units 1 d ago  SARS COV1 AB(IGG)SPIKE,SEMI QN <1.00 index <1.00   Comment: .  This test is intended to help identify individuals with   antibodies to SARS-CoV-2 (COVID-19). The results of this  semi-quantitative test should not be interpreted as an  indication or degree of immunity or protection from  reinfection.  .  A test result that is 1.00 or more (Positive) means  antibodies to SARS-CoV-2 were detected in the blood  sample by the test. This could mean that the individual  may have an immune response to a recent or prior  infection with SARS-CoV-2. Positive results may occur  after COVID-19 vaccination, but the clinical significance  of a positive antibody result for individuals that have  received a COVID-19 vaccine is unknown, and the  performance of the test has not been established in  COVID-19 vaccinees. False positive results for the  test may occur due to cross-reactivity from pre-existing  antibodies or other possible causes.  .  A test result that is less than 1.00 (Negative) means  that antibodies were not detected in the blood sample by  thetest. This could mean that the individual has not  been previously infected with SARS-CoV-2. The clinical  significance of a negative antibody result for individuals  that have received a COVID-19 vaccine is unknown.  The performance of the test has not been established  in COVID-19 vaccinees. False negative results for the  test may occur if the individual's antibodies have  not reached a sufficient level for the test to be able to  detect them. Antibodies can take up to two to three weeks  (sometimes longer) to develop after someone is infected.  How long antibodies to SARS-CoV-2 last after infection is  not known.  [name]

## 2021-02-16 NOTE — Patient Instructions (Signed)
Great to see you again today! I will be in touch with your labs and will complete your form ASAP Referral to see Dr Nelva Bush at [facility] about your neck Schedule your colon in the next few months  Please see me in about 6 months assuming all is well    Health Maintenance, Male Adopting a healthy lifestyle and getting preventive care are important in promoting health and wellness. Ask your health care provider about:  The right schedule for you to have regular tests and exams.  Things you can do on your own to prevent diseases and keep yourself healthy. What should I know about diet, weight, and exercise? Eat a healthy diet  Eat a diet that includes plenty of vegetables, fruits, low-fat dairy products, and lean protein.  Do not eat a lot of foods that are high in solid fats, added sugars, or sodium.   Maintain a healthy weight Body mass index (BMI) is a measurement that can be used to identify possible weight problems. It estimates body fat based on height and weight. Your health care provider can help determine your BMI and help you achieve or maintain a healthy weight. Get regular exercise Get regular exercise. This is one of the most important things you can do for your health. Most adults should:  Exercise for at least 150 minutes each week. The exercise should increase your heart rate and make you sweat (moderate-intensity exercise).  Do strengthening exercises at least twice a week. This is in addition to the moderate-intensity exercise.  Spend less time sitting. Even light physical activity can be beneficial. Watch cholesterol and blood lipids Have your blood tested for lipids and cholesterol at 61 years of age, then have this test every 5 years. You may need to have your cholesterol levels checked more often if:  Your lipid or cholesterol levels are high.  You are older than 61 years of age.  You are at high risk for heart disease. What should I know about cancer  screening? Many types of cancers can be detected early and may often be prevented. Depending on your health history and family history, you may need to have cancer screening at various ages. This may include screening for:  Colorectal cancer.  Prostate cancer.  Skin cancer.  Lung cancer. What should I know about heart disease, diabetes, and high blood pressure? Blood pressure and heart disease  High blood pressure causes heart disease and increases the risk of stroke. This is more likely to develop in people who have high blood pressure readings, are of African descent, or are overweight.  Talk with your health care provider about your target blood pressure readings.  Have your blood pressure checked: ? Every 3-5 years if you are 36-52 years of age. ? Every year if you are 17 years old or older.  If you are between the ages of 51 and 108 and are a current or former smoker, ask your health care provider if you should have a one-time screening for abdominal aortic aneurysm (AAA). Diabetes Have regular diabetes screenings. This checks your fasting blood sugar level. Have the screening done:  Once every three years after age 4 if you are at a normal weight and have a low risk for diabetes.  More often and at a younger age if you are overweight or have a high risk for diabetes. What should I know about preventing infection? Hepatitis B If you have a higher risk for hepatitis B, you should be screened for  this virus. Talk with your health care provider to find out if you are at risk for hepatitis B infection. Hepatitis C Blood testing is recommended for:  Everyone born from [birth year] through 1966.  Anyone with known risk factors for hepatitis C. Sexually transmitted infections (STIs)  You should be screened each year for STIs, including gonorrhea and chlamydia, if: ? You are sexually active and are younger than 61 years of age. ? You are older than 60 years of age and your health care  provider tells you that you are at risk for this type of infection. ? Your sexual activity has changed since you were last screened, and you are at increased risk for chlamydia or gonorrhea. Ask your health care provider if you are at risk.  Ask your health care provider about whether you are at high risk for HIV. Your health care provider may recommend a prescription medicine to help prevent HIV infection. If you choose to take medicine to prevent HIV, you should first get tested for HIV. You should then be tested every 3 months for as long as you are taking the medicine. Follow these instructions at home: Lifestyle  Do not use any products that contain nicotine or tobacco, such as cigarettes, e-cigarettes, and chewing tobacco. If you need help quitting, ask your health care provider.  Do not use street drugs.  Do not share needles.  Ask your health care provider for help if you need support or information about quitting drugs. Alcohol use  Do not drink alcohol if your health care provider tells you not to drink.  If you drink alcohol: ? Limit how much you have to 0-2 drinks a day. ? Be aware of how much alcohol is in your drink. In the [place] one drink equals one 12 oz bottle of beer (355 mL), one 5 oz glass of wine (148 mL), or one 1 oz glass of hard liquor (44 mL). General instructions  Schedule regular health, dental, and eye exams.  Stay current with your vaccines.  Tell your health care provider if: ? You often feel depressed. ? You have ever been abused or do not feel safe at home. Summary  Adopting a healthy lifestyle and getting preventive care are important in promoting health and wellness.  Follow your health care provider's instructions about healthy diet, exercising, and getting tested or screened for diseases.  Follow your health care provider's instructions on monitoring your cholesterol and blood pressure. This information is not intended to replace advice given  to you by your health care provider. Make sure you discuss any questions you have with your health care provider. Document Revised: 11/24/2018 Document Reviewed: 11/24/2018 Elsevier Patient Education  2021 [employer].

## 2021-02-18 ENCOUNTER — Encounter: Payer: Self-pay | Admitting: Family Medicine

## 2021-02-18 ENCOUNTER — Ambulatory Visit (INDEPENDENT_AMBULATORY_CARE_PROVIDER_SITE_OTHER): Payer: 59 | Admitting: Family Medicine

## 2021-02-18 ENCOUNTER — Other Ambulatory Visit: Payer: Self-pay

## 2021-02-18 VITALS — BP 132/80 | HR 67 | Temp 97.6°F | Ht 64.0 in | Wt 150.0 lb

## 2021-02-18 DIAGNOSIS — E785 Hyperlipidemia, unspecified: Secondary | ICD-10-CM | POA: Diagnosis not present

## 2021-02-18 DIAGNOSIS — Z131 Encounter for screening for diabetes mellitus: Secondary | ICD-10-CM | POA: Diagnosis not present

## 2021-02-18 DIAGNOSIS — E559 Vitamin D deficiency, unspecified: Secondary | ICD-10-CM | POA: Diagnosis not present

## 2021-02-18 DIAGNOSIS — R519 Headache, unspecified: Secondary | ICD-10-CM

## 2021-02-18 DIAGNOSIS — Z0000 Encounter for general adult medical examination without abnormal findings: Secondary | ICD-10-CM | POA: Diagnosis not present

## 2021-02-18 DIAGNOSIS — R972 Elevated prostate specific antigen [PSA]: Secondary | ICD-10-CM

## 2021-02-18 DIAGNOSIS — G8929 Other chronic pain: Secondary | ICD-10-CM

## 2021-02-18 DIAGNOSIS — Z20822 Contact with and (suspected) exposure to covid-19: Secondary | ICD-10-CM

## 2021-02-18 DIAGNOSIS — F419 Anxiety disorder, unspecified: Secondary | ICD-10-CM

## 2021-02-18 DIAGNOSIS — Z125 Encounter for screening for malignant neoplasm of prostate: Secondary | ICD-10-CM | POA: Diagnosis not present

## 2021-02-18 DIAGNOSIS — M542 Cervicalgia: Secondary | ICD-10-CM

## 2021-02-18 LAB — LIPID PANEL
Cholesterol: 125 mg/dL (ref 0–200)
HDL: 49.5 mg/dL (ref >39.00)
LDL Cholesterol: 61 mg/dL (ref 0–99)
NonHDL: 75.79
Total CHOL/HDL Ratio: 3
Triglycerides: 76 mg/dL (ref 0.0–149.0)
VLDL: 15.2 mg/dL (ref 0.0–40.0)

## 2021-02-18 LAB — BASIC METABOLIC PANEL
BUN: 17 mg/dL (ref 6–23)
CO2: 30 mEq/L (ref 19–32)
Calcium: 9.6 mg/dL (ref 8.4–10.5)
Chloride: 104 mEq/L (ref 96–112)
Creatinine, Ser: 0.99 mg/dL (ref 0.40–1.50)
GFR: 82.65 mL/min (ref >60.00)
Glucose, Bld: 90 mg/dL (ref 70–99)
Potassium: 4.7 mEq/L (ref 3.5–5.1)
Sodium: 140 mEq/L (ref 135–145)

## 2021-02-18 LAB — PSA: PSA: 2.73 ng/mL (ref 0.10–4.00)

## 2021-02-18 LAB — VITAMIN D 25 HYDROXY (VIT D DEFICIENCY, FRACTURES): VITD: 70.54 ng/mL (ref 30.00–100.00)

## 2021-02-18 LAB — HEMOGLOBIN A1C: Hgb A1c MFr Bld: 5.8 % (ref 4.6–6.5)

## 2021-02-18 MED ORDER — LORAZEPAM 1 MG PO TABS: 1.0000 mg | ORAL_TABLET | ORAL | 0 refills | Status: DC | PRN

## 2021-02-18 NOTE — Addendum Note (Signed)
Addended by: Lamar Blinks C on: 02/18/2021 01:25 PM   Modules accepted: Orders

## 2021-02-19 ENCOUNTER — Encounter: Payer: Self-pay | Admitting: Family Medicine

## 2021-02-19 LAB — SARS-COV-2 ANTIBODY(IGG)SPIKE,SEMI-QUANTITATIVE: SARS COV1 AB(IGG)SPIKE,SEMI QN: <1 index (ref <1.00)

## 2021-03-01 ENCOUNTER — Encounter: Payer: Self-pay | Admitting: Internal Medicine

## 2021-03-19 ENCOUNTER — Other Ambulatory Visit: Payer: Self-pay

## 2021-03-19 ENCOUNTER — Encounter: Payer: Self-pay | Admitting: Family Medicine

## 2021-03-19 ENCOUNTER — Ambulatory Visit (AMBULATORY_SURGERY_CENTER): Payer: 59

## 2021-03-19 VITALS — Ht 65.0 in | Wt 148.0 lb

## 2021-03-19 DIAGNOSIS — Z1211 Encounter for screening for malignant neoplasm of colon: Secondary | ICD-10-CM

## 2021-03-19 DIAGNOSIS — R918 Other nonspecific abnormal finding of lung field: Secondary | ICD-10-CM

## 2021-03-19 MED ORDER — SUTAB 1479-225-188 MG PO TABS: 12.0000 | ORAL_TABLET | ORAL | 0 refills | Status: DC

## 2021-03-19 NOTE — Progress Notes (Signed)
Pt verified name, DOB, address and insurance during PV today.   Pt mailed instruction packet to included, copy of consent form to read and not return, and instructions. Sutab coupon mailed in packet.   PV completed over the phone. Pt encouraged to call with questions or issues.   No allergies to soy or egg Pt is not on blood thinners or diet pills Denies issues with sedation/intubation Denies atrial flutter/fib Denies constipation   Pt is aware of Covid safety and care partner requirements.

## 2021-03-19 NOTE — Addendum Note (Signed)
Addended by: Jake Bathe on: 03/19/2021 11:44 AM   Modules accepted: Orders

## 2021-03-26 ENCOUNTER — Encounter: Payer: Self-pay | Admitting: Internal Medicine

## 2021-03-26 ENCOUNTER — Telehealth: Payer: Self-pay | Admitting: Internal Medicine

## 2021-03-26 NOTE — Telephone Encounter (Signed)
Saw Terri in Pv last week -  Day before pills at 6 pm  But instructions state 6 am -pt instructed take 1st pills at 6 PM NOT AM - pt verbalized understanding

## 2021-03-26 NOTE — Telephone Encounter (Signed)
Patient has questions about procedure instructions. Please call him.

## 2021-03-27 ENCOUNTER — Ambulatory Visit (HOSPITAL_BASED_OUTPATIENT_CLINIC_OR_DEPARTMENT_OTHER)
Admission: RE | Admit: 2021-03-27 | Discharge: 2021-03-27 | Disposition: A | Discharge status: Home / Self Care | Payer: 59 | Source: Ambulatory Visit | Attending: Family Medicine | Admitting: Family Medicine

## 2021-03-27 ENCOUNTER — Other Ambulatory Visit: Payer: Self-pay

## 2021-03-27 DIAGNOSIS — R918 Other nonspecific abnormal finding of lung field: Secondary | ICD-10-CM | POA: Insufficient documentation

## 2021-03-28 ENCOUNTER — Encounter: Payer: Self-pay | Admitting: Family Medicine

## 2021-03-28 ENCOUNTER — Other Ambulatory Visit: Payer: Self-pay | Admitting: Family Medicine

## 2021-03-28 DIAGNOSIS — R918 Other nonspecific abnormal finding of lung field: Secondary | ICD-10-CM

## 2021-04-02 ENCOUNTER — Encounter: Payer: Self-pay | Admitting: Internal Medicine

## 2021-04-02 ENCOUNTER — Ambulatory Visit (AMBULATORY_SURGERY_CENTER): Payer: 59 | Admitting: Internal Medicine

## 2021-04-02 ENCOUNTER — Other Ambulatory Visit: Payer: Self-pay

## 2021-04-02 VITALS — BP 113/67 | HR 70 | Temp 98.1°F | Resp 11 | Ht 64.0 in | Wt 148.0 lb

## 2021-04-02 DIAGNOSIS — Z1211 Encounter for screening for malignant neoplasm of colon: Secondary | ICD-10-CM | POA: Diagnosis not present

## 2021-04-02 DIAGNOSIS — D120 Benign neoplasm of cecum: Secondary | ICD-10-CM | POA: Diagnosis not present

## 2021-04-02 MED ORDER — SODIUM CHLORIDE 0.9 % IV SOLN: 500.0000 mL | Freq: Once | INTRAVENOUS | Status: DC

## 2021-04-02 NOTE — Op Note (Signed)
[place] Patient Name: Curtis Luna Procedure Date: 04/02/2021 9:21 AM MRN: [id number] Endoscopist: Docia Chuck. Henrene Pastor , MD Age: 60 Referring MD:  Date of Birth: [birth year] Gender: Male Account #: [id number] Procedure:                Colonoscopy with cold snare polypectomy x 1 Indications:              Screening for colorectal malignant neoplasm.                            Previous examination 2012 negative for neoplasia.                            History of sigmoid resection for diverticular                            disease Medicines:                Monitored Anesthesia Care Procedure:                Pre-Anesthesia Assessment:                           - Prior to the procedure, a History and Physical                            was performed, and patient medications and                            allergies were reviewed. The patient's tolerance of                            previous anesthesia was also reviewed. The risks                            and benefits of the procedure and the sedation                            options and risks were discussed with the patient.                            All questions were answered, and informed consent                            was obtained. Prior Anticoagulants: The patient has                            taken no previous anticoagulant or antiplatelet                            agents. ASA Grade Assessment: II - A patient with                            mild systemic disease. After reviewing the risks  and benefits, the patient was deemed in                            satisfactory condition to undergo the procedure.                           After obtaining informed consent, the colonoscope                            was passed under direct vision. Throughout the                            procedure, the patient's blood pressure, pulse, and                            oxygen saturations were monitored  continuously. The                            Olympus CF-HQ190 [id number]) Colonoscope was                            introduced through the anus and advanced to the the                            cecum, identified by appendiceal orifice and                            ileocecal valve. The ileocecal valve, appendiceal                            orifice, and rectum were photographed. The quality                            of the bowel preparation was excellent. The                            colonoscopy was performed without difficulty. The                            patient tolerated the procedure well. The bowel                            preparation used was SUPREP via split dose                            instruction. Scope In: 9:37:04 AM Scope Out: 9:56:38 AM Scope Withdrawal Time: 0 hours 7 minutes 59 seconds  Total Procedure Duration: 0 hours 9 minutes 42 seconds  Findings:                 A 10 mm polyp was found in the cecum. The polyp was                            multi-lobulated. The polyp was removed with a cold  snare. Resection and retrieval were complete.                           Multiple diverticula were found in the left colon                            and right colon. The colonic anastomosis was                            located at 20 cm and appeared unremarkable                           The exam was otherwise without abnormality on                            direct and retroflexion views. Complications:            No immediate complications. Estimated blood loss:                            None. Estimated Blood Loss:     Estimated blood loss: none. Impression:               - One 10 mm polyp in the cecum, removed with a cold                            snare. Resected and retrieved.                           - Diverticulosis in the left colon and in the right                            colon. Status post sigmoid colectomy.                            - The examination was otherwise normal on direct                            and retroflexion views. Recommendation:           - Repeat colonoscopy in 3 years for surveillance.                           - Patient has a contact number available for                            emergencies. The signs and symptoms of potential                            delayed complications were discussed with the                            patient. Return to normal activities tomorrow.  Written discharge instructions were provided to the                            patient.                           - Resume previous diet.                           - Continue present medications.                           - Await pathology results. Docia Chuck. Henrene Pastor, MD 04/02/2021 9:52:11 AM This report has been signed electronically.

## 2021-04-02 NOTE — Patient Instructions (Signed)
Please read handouts provided. Continue present medications. Await pathology results. Repeat colonoscopy in 3 years for surveillance.     YOU HAD AN ENDOSCOPIC PROCEDURE TODAY AT [facility]:   Refer to the procedure report that was given to you for any specific questions about what was found during the examination.  If the procedure report does not answer your questions, please call your gastroenterologist to clarify.  If you requested that your care partner not be given the details of your procedure findings, then the procedure report has been included in a sealed envelope for you to review at your convenience later.  YOU SHOULD EXPECT: Some feelings of bloating in the abdomen. Passage of more gas than usual.  Walking can help get rid of the air that was put into your GI tract during the procedure and reduce the bloating. If you had a lower endoscopy (such as a colonoscopy or flexible sigmoidoscopy) you may notice spotting of blood in your stool or on the toilet paper. If you underwent a bowel prep for your procedure, you may not have a normal bowel movement for a few days.  Please Note:  You might notice some irritation and congestion in your nose or some drainage.  This is from the oxygen used during your procedure.  There is no need for concern and it should clear up in a day or so.  SYMPTOMS TO REPORT IMMEDIATELY:   Following lower endoscopy (colonoscopy or flexible sigmoidoscopy):  Excessive amounts of blood in the stool  Significant tenderness or worsening of abdominal pains  Swelling of the abdomen that is new, acute  Fever of 100F or higher   For urgent or emergent issues, a gastroenterologist can be reached at any hour by calling [phone]. Do not use MyChart messaging for urgent concerns.    DIET:  We do recommend a small meal at first, but then you may proceed to your regular diet.  Drink plenty of fluids but you should avoid alcoholic beverages for 24  hours.  ACTIVITY:  You should plan to take it easy for the rest of today and you should NOT DRIVE or use heavy machinery until tomorrow (because of the sedation medicines used during the test).    FOLLOW UP: Our staff will call the number listed on your records 48-72 hours following your procedure to check on you and address any questions or concerns that you may have regarding the information given to you following your procedure. If we do not reach you, we will leave a message.  We will attempt to reach you two times.  During this call, we will ask if you have developed any symptoms of COVID 19. If you develop any symptoms (ie: fever, flu-like symptoms, shortness of breath, cough etc.) before then, please call [phone].  If you test positive for Covid 19 in the 2 weeks post procedure, please call and report this information to [place].    If any biopsies were taken you will be contacted by phone or by letter within the next 1-3 weeks.  Please call us at [phone] if you have not heard about the biopsies in 3 weeks.    SIGNATURES/CONFIDENTIALITY: You and/or your care partner have signed paperwork which will be entered into your electronic medical record.  These signatures attest to the fact that that the information above on your After Visit Summary has been reviewed and is understood.  Full responsibility of the confidentiality of this discharge information lies with you  and/or your care-partner. 

## 2021-04-02 NOTE — Progress Notes (Signed)
VS-CW  Pt's states no medical or surgical changes since previsit or office visit.  

## 2021-04-02 NOTE — Progress Notes (Signed)
A and O x3. Report to RN. Tolerated MAC anesthesia well.

## 2021-04-02 NOTE — Progress Notes (Signed)
Called to room to assist during endoscopic procedure.  Patient ID and intended procedure confirmed with present staff. Received instructions for my participation in the procedure from the performing physician.  

## 2021-04-04 ENCOUNTER — Telehealth: Payer: Self-pay | Admitting: *Deleted

## 2021-04-04 NOTE — Telephone Encounter (Signed)
  Follow up Call-  Call back number 04/02/2021 05/23/2019  Post procedure Call Back phone  # [phone]  Permission to leave phone message Yes Yes  Some recent data might be hidden     Patient questions:  Do you have a fever, pain , or abdominal swelling? No. Pain Score  0 *  Have you tolerated food without any problems? Yes.    Have you been able to return to your normal activities? Yes.    Do you have any questions about your discharge instructions: Diet   No. Medications  No. Follow up visit  No.  Do you have questions or concerns about your Care? No.  Actions: * If pain score is 4 or above: No action needed, pain <4.  1. Have you developed a fever since your procedure? no  2.   Have you had an respiratory symptoms (SOB or cough) since your procedure? no  3.   Have you tested positive for COVID 19 since your procedure no  4.   Have you had any family members/close contacts diagnosed with the COVID 19 since your procedure? no   If yes to any of these questions please route to Joylene John, RN and Joella Prince, RN

## 2021-04-04 NOTE — Telephone Encounter (Signed)
Follow up call made. 

## 2021-04-08 ENCOUNTER — Encounter: Payer: Self-pay | Admitting: Internal Medicine

## 2021-05-01 ENCOUNTER — Emergency Department (HOSPITAL_COMMUNITY): Payer: MEDICAID

## 2021-05-01 ENCOUNTER — Inpatient Hospital Stay (HOSPITAL_COMMUNITY): Payer: MEDICAID | Admitting: Surgery

## 2021-05-01 ENCOUNTER — Encounter (HOSPITAL_COMMUNITY): Payer: Self-pay | Admitting: Surgery

## 2021-05-01 ENCOUNTER — Emergency Department (HOSPITAL_COMMUNITY): Admit: 2021-05-01 | Discharge: 2021-05-01 | Disposition: A | Discharge status: Home / Self Care | Payer: MEDICAID

## 2021-05-01 ENCOUNTER — Other Ambulatory Visit: Payer: Self-pay

## 2021-05-01 ENCOUNTER — Inpatient Hospital Stay
Admission: EM | Admit: 2021-05-01 | Discharge: 2021-05-29 | DRG: 963 | Disposition: A | Discharge status: Home / Self Care | Payer: MEDICAID | Attending: Family Medicine | Admitting: Family Medicine

## 2021-05-01 DIAGNOSIS — E873 Alkalosis: Secondary | ICD-10-CM | POA: Diagnosis not present

## 2021-05-01 DIAGNOSIS — J939 Pneumothorax, unspecified: Secondary | ICD-10-CM

## 2021-05-01 DIAGNOSIS — Z20822 Contact with and (suspected) exposure to covid-19: Secondary | ICD-10-CM | POA: Diagnosis present

## 2021-05-01 DIAGNOSIS — S36039A Unspecified laceration of spleen, initial encounter: Secondary | ICD-10-CM

## 2021-05-01 DIAGNOSIS — D696 Thrombocytopenia, unspecified: Secondary | ICD-10-CM | POA: Diagnosis not present

## 2021-05-01 DIAGNOSIS — S2249XA Multiple fractures of ribs, unspecified side, initial encounter for closed fracture: Secondary | ICD-10-CM | POA: Diagnosis present

## 2021-05-01 DIAGNOSIS — K567 Ileus, unspecified: Secondary | ICD-10-CM | POA: Diagnosis not present

## 2021-05-01 DIAGNOSIS — Z9911 Dependence on respirator [ventilator] status: Secondary | ICD-10-CM | POA: Diagnosis present

## 2021-05-01 DIAGNOSIS — K429 Umbilical hernia without obstruction or gangrene: Secondary | ICD-10-CM | POA: Diagnosis present

## 2021-05-01 DIAGNOSIS — S51812A Laceration without foreign body of left forearm, initial encounter: Secondary | ICD-10-CM

## 2021-05-01 DIAGNOSIS — F17200 Nicotine dependence, unspecified, uncomplicated: Secondary | ICD-10-CM | POA: Insufficient documentation

## 2021-05-01 DIAGNOSIS — D72819 Decreased white blood cell count, unspecified: Secondary | ICD-10-CM

## 2021-05-01 DIAGNOSIS — S0003XA Contusion of scalp, initial encounter: Secondary | ICD-10-CM | POA: Diagnosis present

## 2021-05-01 DIAGNOSIS — S3991XA Unspecified injury of abdomen, initial encounter: Secondary | ICD-10-CM

## 2021-05-01 DIAGNOSIS — S0990XA Unspecified injury of head, initial encounter: Secondary | ICD-10-CM

## 2021-05-01 DIAGNOSIS — S27321A Contusion of lung, unilateral, initial encounter: Secondary | ICD-10-CM | POA: Diagnosis present

## 2021-05-01 DIAGNOSIS — J942 Hemothorax: Secondary | ICD-10-CM

## 2021-05-01 DIAGNOSIS — T797XXA Traumatic subcutaneous emphysema, initial encounter: Secondary | ICD-10-CM | POA: Diagnosis present

## 2021-05-01 DIAGNOSIS — E8770 Fluid overload, unspecified: Secondary | ICD-10-CM | POA: Diagnosis not present

## 2021-05-01 DIAGNOSIS — S22009A Unspecified fracture of unspecified thoracic vertebra, initial encounter for closed fracture: Secondary | ICD-10-CM | POA: Diagnosis present

## 2021-05-01 DIAGNOSIS — F1010 Alcohol abuse, uncomplicated: Secondary | ICD-10-CM | POA: Diagnosis present

## 2021-05-01 DIAGNOSIS — K766 Portal hypertension: Secondary | ICD-10-CM | POA: Diagnosis present

## 2021-05-01 DIAGNOSIS — E870 Hyperosmolality and hypernatremia: Secondary | ICD-10-CM | POA: Diagnosis not present

## 2021-05-01 DIAGNOSIS — B369 Superficial mycosis, unspecified: Secondary | ICD-10-CM | POA: Diagnosis not present

## 2021-05-01 DIAGNOSIS — R0902 Hypoxemia: Secondary | ICD-10-CM

## 2021-05-01 DIAGNOSIS — S22059A Unspecified fracture of T5-T6 vertebra, initial encounter for closed fracture: Secondary | ICD-10-CM | POA: Diagnosis present

## 2021-05-01 DIAGNOSIS — F17210 Nicotine dependence, cigarettes, uncomplicated: Secondary | ICD-10-CM | POA: Diagnosis present

## 2021-05-01 DIAGNOSIS — G928 Other toxic encephalopathy: Secondary | ICD-10-CM | POA: Diagnosis not present

## 2021-05-01 DIAGNOSIS — F10231 Alcohol dependence with withdrawal delirium: Secondary | ICD-10-CM | POA: Diagnosis present

## 2021-05-01 DIAGNOSIS — J90 Pleural effusion, not elsewhere classified: Secondary | ICD-10-CM | POA: Diagnosis present

## 2021-05-01 DIAGNOSIS — G4700 Insomnia, unspecified: Secondary | ICD-10-CM | POA: Diagnosis not present

## 2021-05-01 DIAGNOSIS — Z9889 Other specified postprocedural states: Secondary | ICD-10-CM

## 2021-05-01 DIAGNOSIS — I10 Essential (primary) hypertension: Secondary | ICD-10-CM | POA: Diagnosis present

## 2021-05-01 DIAGNOSIS — S2242XA Multiple fractures of ribs, left side, initial encounter for closed fracture: Secondary | ICD-10-CM | POA: Diagnosis present

## 2021-05-01 DIAGNOSIS — Z9114 Patient's other noncompliance with medication regimen: Secondary | ICD-10-CM

## 2021-05-01 DIAGNOSIS — K7460 Unspecified cirrhosis of liver: Secondary | ICD-10-CM | POA: Diagnosis present

## 2021-05-01 DIAGNOSIS — E7220 Disorder of urea cycle metabolism, unspecified: Secondary | ICD-10-CM

## 2021-05-01 DIAGNOSIS — S3600XA Unspecified injury of spleen, initial encounter: Secondary | ICD-10-CM | POA: Diagnosis present

## 2021-05-01 DIAGNOSIS — K7030 Alcoholic cirrhosis of liver without ascites: Secondary | ICD-10-CM | POA: Diagnosis present

## 2021-05-01 DIAGNOSIS — W19XXXA Unspecified fall, initial encounter: Secondary | ICD-10-CM

## 2021-05-01 DIAGNOSIS — J9601 Acute respiratory failure with hypoxia: Secondary | ICD-10-CM | POA: Diagnosis present

## 2021-05-01 DIAGNOSIS — Y92009 Unspecified place in unspecified non-institutional (private) residence as the place of occurrence of the external cause: Secondary | ICD-10-CM

## 2021-05-01 DIAGNOSIS — G9340 Encephalopathy, unspecified: Secondary | ICD-10-CM

## 2021-05-01 DIAGNOSIS — R079 Chest pain, unspecified: Secondary | ICD-10-CM

## 2021-05-01 DIAGNOSIS — S36031A Moderate laceration of spleen, initial encounter: Secondary | ICD-10-CM | POA: Diagnosis present

## 2021-05-01 DIAGNOSIS — W109XXA Fall (on) (from) unspecified stairs and steps, initial encounter: Secondary | ICD-10-CM | POA: Diagnosis present

## 2021-05-01 DIAGNOSIS — J13 Pneumonia due to Streptococcus pneumoniae: Secondary | ICD-10-CM | POA: Diagnosis not present

## 2021-05-01 DIAGNOSIS — J9690 Respiratory failure, unspecified, unspecified whether with hypoxia or hypercapnia: Secondary | ICD-10-CM

## 2021-05-01 DIAGNOSIS — S272XXA Traumatic hemopneumothorax, initial encounter: Principal | ICD-10-CM | POA: Diagnosis present

## 2021-05-01 DIAGNOSIS — S2243XA Multiple fractures of ribs, bilateral, initial encounter for closed fracture: Secondary | ICD-10-CM

## 2021-05-01 DIAGNOSIS — Z6826 Body mass index (BMI) 26.0-26.9, adult: Secondary | ICD-10-CM

## 2021-05-01 HISTORY — DX: Contusion of lung, unilateral, initial encounter: S27.321A

## 2021-05-01 HISTORY — DX: Alcohol use, unspecified with withdrawal delirium: F10.931

## 2021-05-01 HISTORY — DX: Multiple fractures of ribs, unspecified side, initial encounter for closed fracture: S22.49XA

## 2021-05-01 HISTORY — DX: Essential (primary) hypertension: I10

## 2021-05-01 HISTORY — DX: Thrombocytopenia, unspecified: D69.6

## 2021-05-01 HISTORY — DX: Unspecified cirrhosis of liver: K74.60

## 2021-05-01 HISTORY — DX: Presence of spectacles and contact lenses: Z97.3

## 2021-05-01 HISTORY — DX: Alcohol abuse, uncomplicated: F10.10

## 2021-05-01 HISTORY — DX: Exposure to other specified factors, initial encounter: X58.XXXA

## 2021-05-01 HISTORY — DX: Alcohol dependence with withdrawal delirium (CMS HCC): F10.231

## 2021-05-01 HISTORY — DX: Unspecified injury of spleen, initial encounter: S36.00XA

## 2021-05-01 HISTORY — DX: Unspecified fracture of unspecified thoracic vertebra, initial encounter for closed fracture: S22.009A

## 2021-05-01 LAB — CBC WITH DIFF
BASOPHIL #: 0 10*3/uL (ref 0.00–0.20)
BASOPHIL %: 0 % (ref 0–2)
EOSINOPHIL #: 0 10*3/uL (ref 0.00–0.60)
EOSINOPHIL %: 0 % (ref 0–5)
HCT: 46 % (ref 36.0–46.0)
HGB: 15.9 g/dL (ref 13.9–16.3)
LYMPHOCYTE #: 1 10*3/uL — ABNORMAL LOW (ref 1.10–3.80)
LYMPHOCYTE %: 12 % — ABNORMAL LOW (ref 19–46)
MCH: 34.4 pg — ABNORMAL HIGH (ref 25.4–34.0)
MCHC: 34.7 g/dL (ref 30.0–37.0)
MCV: 99.1 fL (ref 80.0–100.0)
MONOCYTE #: 0.9 10*3/uL — ABNORMAL HIGH (ref 0.10–0.80)
MONOCYTE %: 11 % (ref 4–12)
MPV: 9.6 fL (ref 7.5–11.5)
NEUTROPHIL #: 6.3 10*3/uL (ref 1.80–7.50)
NEUTROPHIL %: 76 % — ABNORMAL HIGH (ref 41–69)
PLATELETS: 86 10*3/uL — ABNORMAL LOW (ref 130–400)
RBC: 4.64 10*6/uL (ref 4.30–5.90)
RDW: 15.3 % — ABNORMAL HIGH (ref 11.5–14.0)
WBC: 8.2 10*3/uL (ref 4.5–11.5)

## 2021-05-01 LAB — TROPONIN-I: TROPONIN I: <0.01 ng/mL (ref 0.00–0.03)

## 2021-05-01 LAB — H & H
HCT: 46.6 % — ABNORMAL HIGH (ref 36.0–46.0)
HCT: 42.5 % (ref 36.0–46.0)
HGB: 16 g/dL (ref 13.9–16.3)
HGB: 14.4 g/dL (ref 13.9–16.3)

## 2021-05-01 LAB — SCAN DIFFERENTIAL
PLATELET ESTIMATE: DECREASED
WBC MORPHOLOGY COMMENT: NORMAL

## 2021-05-01 LAB — ECG 12 LEAD
Calculated P Axis: 90 degrees
Calculated R Axis: 11 degrees
Calculated T Axis: 38 degrees
PR Interval: 156 ms
QRS Duration: 112 ms
QT Interval: 404 ms
QTC Calculation: 440 ms
Ventricular rate: 80 {beats}/min

## 2021-05-01 LAB — COMPREHENSIVE METABOLIC PANEL, NON-FASTING
ALBUMIN/GLOBULIN RATIO: 1.2 — ABNORMAL LOW (ref 1.5–2.5)
ALBUMIN: 4.2 g/dL (ref 3.5–5.0)
ALKALINE PHOSPHATASE: 140 U/L — ABNORMAL HIGH (ref 38–126)
ALT (SGPT): 61 U/L — ABNORMAL HIGH (ref <50)
ANION GAP: 12 mmol/L (ref 5–19)
AST (SGOT): 86 U/L — ABNORMAL HIGH (ref 17–59)
BILIRUBIN TOTAL: 1 mg/dL (ref 0.2–1.3)
BUN/CREA RATIO: 11 (ref 6–20)
BUN: 6 mg/dL — ABNORMAL LOW (ref 9–20)
CALCIUM: 8.8 mg/dL (ref 8.4–10.2)
CHLORIDE: 110 mmol/L — ABNORMAL HIGH (ref 98–107)
CO2 TOTAL: 17 mmol/L — ABNORMAL LOW (ref 22–30)
CREATININE: 0.56 mg/dL — ABNORMAL LOW (ref 0.66–1.20)
ESTIMATED GFR: >60 mL/min/{1.73_m2} (ref >60)
GLUCOSE: 153 mg/dL — ABNORMAL HIGH (ref 74–106)
POTASSIUM: 4.1 mmol/L (ref 3.5–5.1)
PROTEIN TOTAL: 7.8 g/dL (ref 6.3–8.2)
SODIUM: 139 mmol/L (ref 137–145)

## 2021-05-01 LAB — TYPE AND SCREEN
ABO/RH(D): A POS
ANTIBODY SCREEN: NEGATIVE

## 2021-05-01 LAB — ARTERIAL BLOOD GAS, CO-OX, LYTES, LACTATE REFLEX
%FIO2 (ARTERIAL): 100 %
BASE DEFICIT: 2.7 mmol/L (ref 0.0–3.0)
BICARBONATE (ARTERIAL): 22.6 mmol/L (ref 18.0–26.0)
CARBOXYHEMOGLOBIN: 9.1 % (ref 0.0–2.5)
CHLORIDE: 104 mmol/L (ref 101–111)
GLUCOSE: 155 mg/dL — ABNORMAL HIGH (ref 60–105)
HEMATOCRITRT: 46 %
HEMOGLOBIN: 15.3 g/dL (ref 12.0–18.0)
IONIZED CALCIUM: 1.13 mmol/L (ref 1.10–1.35)
LACTATE: 1.8 mmol/L — ABNORMAL HIGH (ref 0.0–1.3)
MET-HEMOGLOBIN: 0.8 % (ref 0.0–2.0)
O2CT: 19.5 % (ref 15.7–24.3)
OXYHEMOGLOBIN: 90 % (ref 85.0–98.0)
PAO2/FIO2 RATIO: 112 (ref <=200)
PCO2 (ARTERIAL): 40 mm/Hg (ref 35–45)
PH (ARTERIAL): 7.36 (ref 7.35–7.45)
PO2 (ARTERIAL): 112 mm/Hg — ABNORMAL HIGH (ref 72–100)
SODIUM: 133 mmol/L — ABNORMAL LOW (ref 137–145)
WHOLE BLOOD POTASSIUM: 3.9 mmol/L (ref 3.5–4.6)

## 2021-05-01 LAB — LIPASE: LIPASE: 156 U/L (ref 23–300)

## 2021-05-01 LAB — PT/INR
INR: 1.13 — ABNORMAL HIGH (ref 0.85–1.10)
PROTHROMBIN TIME: 11.9 seconds (ref 9.0–12.0)

## 2021-05-01 LAB — LIGHT GREEN TOP TUBE

## 2021-05-01 LAB — ETHANOL, SERUM: ETHANOL: 13 mg/dL — ABNORMAL HIGH (ref <10)

## 2021-05-01 LAB — PTT (PARTIAL THROMBOPLASTIN TIME): APTT: 26.8 seconds (ref 22.0–32.0)

## 2021-05-01 MED ORDER — ONDANSETRON HCL (PF) 4 MG/2 ML INJECTION SOLUTION
INTRAMUSCULAR | Status: AC
Start: 2021-05-01 — End: 2021-05-01
  Filled 2021-05-01: qty 2

## 2021-05-01 MED ORDER — BISMUTH SUBSALICYLATE 262 MG CHEWABLE TABLET
262.0000 mg | CHEWABLE_TABLET | ORAL | Status: DC | PRN
Start: 2021-05-01 — End: 2021-05-02
  Filled 2021-05-01: qty 1

## 2021-05-01 MED ORDER — IOPAMIDOL 300 MG IODINE/ML (61 %) INTRAVENOUS SOLUTION
110.0000 mL | INTRAVENOUS | Status: AC
Start: 2021-05-01 — End: 2021-05-01
  Administered 2021-05-01: 110 mL via INTRAVENOUS

## 2021-05-01 MED ORDER — METOPROLOL TARTRATE 5 MG/5 ML INTRAVENOUS SOLUTION
5.0000 mg | Freq: Four times a day (QID) | INTRAVENOUS | Status: DC | PRN
Start: 2021-05-01 — End: 2021-05-30
  Administered 2021-05-02 – 2021-05-19 (×3): 5 mg via INTRAVENOUS
  Filled 2021-05-01 (×3): qty 5

## 2021-05-01 MED ORDER — MULTIVITAMIN WITH FOLIC ACID 400 MCG TABLET
1.0000 | ORAL_TABLET | Freq: Every day | ORAL | Status: DC
Start: 2021-05-01 — End: 2021-05-30
  Administered 2021-05-01 – 2021-05-20 (×20): 1 via ORAL
  Administered 2021-05-21: 0 via ORAL
  Administered 2021-05-22 – 2021-05-29 (×8): 1 via ORAL
  Filled 2021-05-01 (×27): qty 1

## 2021-05-01 MED ORDER — HAEMOPHILUS B POLYSACCHARID CONJ-TETANUS TOX(PF) 10 MCG/0.5 ML IM SOLN
0.5000 mL | Freq: Once | INTRAMUSCULAR | Status: DC
Start: 2021-05-01 — End: 2021-05-30
  Administered 2021-05-01: 0 mL via INTRAMUSCULAR
  Filled 2021-05-01: qty 0.5

## 2021-05-01 MED ORDER — FAMOTIDINE 20 MG TABLET
20.0000 mg | ORAL_TABLET | Freq: Two times a day (BID) | ORAL | Status: DC
Start: 2021-05-01 — End: 2021-05-02
  Administered 2021-05-01: 20 mg via ORAL
  Administered 2021-05-01: 0 mg via ORAL
  Filled 2021-05-01: qty 1

## 2021-05-01 MED ORDER — ONDANSETRON HCL (PF) 4 MG/2 ML INJECTION SOLUTION
4.0000 mg | Freq: Three times a day (TID) | INTRAMUSCULAR | Status: DC | PRN
Start: 2021-05-01 — End: 2021-05-01
  Administered 2021-05-01: 4 mg via INTRAVENOUS

## 2021-05-01 MED ORDER — FENTANYL (PF) 50 MCG/ML INJECTION SOLUTION
INTRAMUSCULAR | Status: AC
Start: 2021-05-01 — End: 2021-05-01
  Filled 2021-05-01: qty 2

## 2021-05-01 MED ORDER — HYDROMORPHONE 1 MG/ML INJECTION WRAPPER
INJECTION | INTRAMUSCULAR | Status: AC
Start: 2021-05-01 — End: 2021-05-01
  Filled 2021-05-01: qty 1

## 2021-05-01 MED ORDER — ONDANSETRON HCL (PF) 4 MG/2 ML INJECTION SOLUTION
4.0000 mg | INTRAMUSCULAR | Status: DC | PRN
Start: 2021-05-01 — End: 2021-05-30

## 2021-05-01 MED ORDER — LIDOCAINE 5 % TOPICAL PATCH
MEDICATED_PATCH | CUTANEOUS | Status: AC
Start: 2021-05-01 — End: 2021-05-01
  Filled 2021-05-01: qty 1

## 2021-05-01 MED ORDER — MENINGOCOCCAL B VAC,4-CMP 50 MCG-50 MCG-50 MCG-25 MCG/0.5ML IM SYRINGE
0.5000 mL | INJECTION | Freq: Once | INTRAMUSCULAR | Status: DC
Start: 2021-05-01 — End: 2021-05-30
  Administered 2021-05-01: 0 mL via INTRAMUSCULAR
  Filled 2021-05-01: qty 0.5

## 2021-05-01 MED ORDER — LIDOCAINE 5 % TOPICAL PATCH
1.0000 | MEDICATED_PATCH | Freq: Every day | CUTANEOUS | Status: DC
Start: 2021-05-01 — End: 2021-05-30
  Administered 2021-05-01 – 2021-05-04 (×4): 700 mg via TRANSDERMAL
  Administered 2021-05-05: 0 mg via TRANSDERMAL
  Administered 2021-05-06 – 2021-05-24 (×19): 700 mg via TRANSDERMAL
  Administered 2021-05-25 – 2021-05-26 (×2): 0 mg via TRANSDERMAL
  Administered 2021-05-27 – 2021-05-29 (×3): 700 mg via TRANSDERMAL
  Filled 2021-05-01 (×27): qty 1

## 2021-05-01 MED ORDER — CLONIDINE HCL 0.2 MG TABLET
0.2000 mg | ORAL_TABLET | ORAL | Status: AC
Start: 2021-05-01 — End: 2021-05-01
  Administered 2021-05-01 (×2): 0.2 mg via ORAL

## 2021-05-01 MED ORDER — ZOLPIDEM 10 MG TABLET
5.0000 mg | ORAL_TABLET | Freq: Every evening | ORAL | Status: DC
Start: 2021-05-01 — End: 2021-05-03
  Administered 2021-05-01: 5 mg via ORAL
  Administered 2021-05-02: 0 mg via ORAL
  Filled 2021-05-01: qty 1

## 2021-05-01 MED ORDER — LORAZEPAM 1 MG TABLET
4.0000 mg | ORAL_TABLET | ORAL | Status: DC | PRN
Start: 2021-05-01 — End: 2021-05-07

## 2021-05-01 MED ORDER — FENTANYL (PF) 50 MCG/ML INJECTION SOLUTION
50.0000 ug | INTRAMUSCULAR | Status: AC
Start: 2021-05-01 — End: 2021-05-01
  Administered 2021-05-01 (×2): 50 ug via INTRAVENOUS

## 2021-05-01 MED ORDER — THIAMINE HCL (VITAMIN B1) 100 MG/ML INJECTION SOLUTION
100.0000 mg | INTRAVENOUS | Status: DC
Start: 2021-05-01 — End: 2021-05-15
  Administered 2021-05-01: 100 mg via INTRAVENOUS
  Administered 2021-05-01 – 2021-05-02 (×2): 0 mg via INTRAVENOUS
  Administered 2021-05-02 – 2021-05-03 (×2): 100 mg via INTRAVENOUS
  Administered 2021-05-03: 0 mg via INTRAVENOUS
  Administered 2021-05-04: 100 mg via INTRAVENOUS
  Administered 2021-05-04 – 2021-05-05 (×2): 0 mg via INTRAVENOUS
  Administered 2021-05-05 – 2021-05-06 (×2): 100 mg via INTRAVENOUS
  Administered 2021-05-06 – 2021-05-07 (×2): 0 mg via INTRAVENOUS
  Administered 2021-05-07 – 2021-05-08 (×2): 100 mg via INTRAVENOUS
  Administered 2021-05-08: 0 mg via INTRAVENOUS
  Administered 2021-05-09: 100 mg via INTRAVENOUS
  Administered 2021-05-09 – 2021-05-10 (×2): 0 mg via INTRAVENOUS
  Administered 2021-05-10: 100 mg via INTRAVENOUS
  Administered 2021-05-11: 0 mg via INTRAVENOUS
  Administered 2021-05-11: 100 mg via INTRAVENOUS
  Administered 2021-05-12: 0 mg via INTRAVENOUS
  Administered 2021-05-12: 100 mg via INTRAVENOUS
  Administered 2021-05-13: 0 mg via INTRAVENOUS
  Administered 2021-05-13 – 2021-05-14 (×2): 100 mg via INTRAVENOUS
  Administered 2021-05-14: 0 mg via INTRAVENOUS
  Filled 2021-05-01 (×18): qty 1

## 2021-05-01 MED ORDER — DOCUSATE SODIUM 100 MG CAPSULE
100.0000 mg | ORAL_CAPSULE | Freq: Two times a day (BID) | ORAL | Status: DC | PRN
Start: 2021-05-01 — End: 2021-05-02

## 2021-05-01 MED ORDER — PNEUMOCOCCAL 13-VAL CONJ VACCINE-DIP CRM (PF) 0.5 ML IM SYRINGE
0.5000 mL | INJECTION | Freq: Once | INTRAMUSCULAR | Status: DC
Start: 2021-05-01 — End: 2021-05-30
  Administered 2021-05-01: 0 mL via INTRAMUSCULAR
  Filled 2021-05-01: qty 0.5

## 2021-05-01 MED ORDER — LORAZEPAM 1 MG TABLET
2.0000 mg | ORAL_TABLET | ORAL | Status: DC | PRN
Start: 2021-05-01 — End: 2021-05-07
  Administered 2021-05-01 – 2021-05-02 (×2): 2 mg via ORAL
  Filled 2021-05-01 (×2): qty 2

## 2021-05-01 MED ORDER — LORAZEPAM 1 MG TABLET
1.0000 mg | ORAL_TABLET | ORAL | Status: DC | PRN
Start: 2021-05-01 — End: 2021-05-07
  Administered 2021-05-01 – 2021-05-02 (×2): 1 mg via ORAL
  Filled 2021-05-01 (×2): qty 1

## 2021-05-01 MED ORDER — ACETAMINOPHEN 1,000 MG/100 ML (10 MG/ML) INTRAVENOUS SOLUTION
1000.0000 mg | Freq: Three times a day (TID) | INTRAVENOUS | Status: AC
Start: 2021-05-01 — End: 2021-05-02
  Administered 2021-05-01: 0 mg via INTRAVENOUS
  Administered 2021-05-01 (×2): 1000 mg via INTRAVENOUS
  Administered 2021-05-01 – 2021-05-02 (×2): 0 mg via INTRAVENOUS
  Administered 2021-05-02: 1000 mg via INTRAVENOUS
  Filled 2021-05-01 (×2): qty 100

## 2021-05-01 MED ORDER — HYDRALAZINE 20 MG/ML INJECTION SOLUTION
10.0000 mg | Freq: Four times a day (QID) | INTRAMUSCULAR | Status: DC | PRN
Start: 2021-05-01 — End: 2021-05-30
  Administered 2021-05-01 – 2021-05-21 (×6): 10 mg via INTRAVENOUS
  Filled 2021-05-01 (×7): qty 1

## 2021-05-01 MED ORDER — MULTIVITAMIN WITH FOLIC ACID 400 MCG TABLET
ORAL_TABLET | ORAL | Status: AC
Start: 2021-05-01 — End: 2021-05-01
  Filled 2021-05-01: qty 1

## 2021-05-01 MED ORDER — LACTATED RINGERS INTRAVENOUS SOLUTION
INTRAVENOUS | Status: DC
Start: 2021-05-01 — End: 2021-05-06
  Administered 2021-05-06: 0 mL via INTRAVENOUS

## 2021-05-01 MED ORDER — HYDROMORPHONE 1 MG/ML INJECTION WRAPPER
1.0000 mg | INJECTION | INTRAMUSCULAR | Status: DC | PRN
Start: 2021-05-01 — End: 2021-05-07
  Administered 2021-05-01 – 2021-05-04 (×8): 1 mg via INTRAVENOUS
  Filled 2021-05-01 (×7): qty 1

## 2021-05-01 MED ORDER — ACETAMINOPHEN 1,000 MG/100 ML (10 MG/ML) INTRAVENOUS SOLUTION
INTRAVENOUS | Status: AC
Start: 2021-05-01 — End: 2021-05-01
  Filled 2021-05-01: qty 100

## 2021-05-01 MED ORDER — SODIUM CHLORIDE 0.9 % IV BOLUS
1000.0000 mL | INJECTION | Status: AC
Start: 2021-05-01 — End: 2021-05-01
  Administered 2021-05-01: 0 mL via INTRAVENOUS
  Administered 2021-05-01: 1000 mL via INTRAVENOUS

## 2021-05-01 MED ORDER — MENINGOC VAC A,C,Y,W-135 DIP(PF) 10 MCG-5 MCG/0.5 ML IM KIT (2 VIALS)
0.5000 mL | PACK | Freq: Once | INTRAMUSCULAR | Status: DC
Start: 2021-05-01 — End: 2021-05-30
  Administered 2021-05-01: 15:00:00 0 mL via INTRAMUSCULAR
  Filled 2021-05-01: qty 0.5

## 2021-05-01 MED ORDER — CLONIDINE HCL 0.2 MG TABLET
ORAL_TABLET | ORAL | Status: AC
Start: 2021-05-01 — End: 2021-05-01
  Filled 2021-05-01: qty 1

## 2021-05-01 NOTE — ED Provider Notes (Signed)
[facility]  Emergency Department      Name: Hector [name]  Age and Gender: 60 y.o. male  Date of Birth: 03/21/1961  Date of Service: 05/01/2021   MRN: [id number]  PCP: No Pcp    No chief complaint on file.      HPI:    Hector [name] is a 60 y.o. male presenting with fall.  Wife reports the patient is a chronic alcoholic that fell approximately 230 this morning and refused to come to the hospital.  She states he did hit his head and he was complaining of left-sided chest and back pain.  He went to sleep and then woke up in severe pain so she brought him to the hospital.  Patient currently is complaining of feeling short of breath and pain throughout his back and chest.  He does not think he lost consciousness but he was intoxicated when he fell.  He denies any pain in his arms or legs.  He denies any nausea, vomiting.        ROS:  All systems reviewed and are negative, unless stated in the HPI.      Below pertinent information reviewed with patient and/or EMR:  No past medical history on file.  Medications Prior to Admission     None        No Known Allergies  No past surgical history on file.  Family Medical History:    None            Objective:  ED Triage Vitals   BP --    Pulse --    Resp --    Temp --    SpO2 05/01/21 0955 99 %   Weight 05/01/21 1023 90.7 kg (200 lb)   Height 05/01/21 1023 1.829 m (6')     Nursing notes and vital signs reviewed.    Constitutional:  Patient appears uncomfortable and he is tachypneic.  HENT:   Head:  There is an abrasion with some bruising in the left posterior parietal/occipital area.  Mouth/Throat: Oropharynx is clear and moist.   Eyes: EOMI, PERRL   Neck: Trachea midline. Neck supple.  Patient has posterior cervical tenderness.  There is also some bruising in the lower cervical area  Cardiovascular: RRR, No murmurs, rubs or gallops. Intact distal pulses.  Pulmonary/Chest:  Breath sounds are diminished bilaterally.  The patient has significant tenderness in the  subscapular area on the left with palpable crepitance.    Abdominal:  Patient is tender throughout the left upper abdomen with some guarding no rebound.    Back:  Patient tender in the midthoracic spine with some bruising present  Musculoskeletal:  Bruising present to both shoulders with no obvious deformity.  No other bony tenderness throughout   Skin: warm and dry.  Multiple bruises as described  Psychiatric: normal mood and affect. Behavior is normal.   Neurological: Patient keenly alert and responsive, CN II-XII grossly intact, moving all extremities equally and fully    Labs:   Labs Reviewed   COMPREHENSIVE METABOLIC PANEL, NON-FASTING - Abnormal; Notable for the following components:       Result Value    CHLORIDE 110 (*)     CO2 TOTAL 17 (*)     BUN 6 (*)     CREATININE 0.56 (*)     GLUCOSE 153 (*)     ALKALINE PHOSPHATASE 140 (*)     ALT (SGPT) 61 (*)     AST (SGOT) 86 (*)  ALBUMIN/GLOBULIN RATIO 1.2 (*)     All other components within normal limits    Narrative:     HEM:20,ICT:<2,TURB:<20  Estimated Glomerular Filtration Rate (eGFR) calculated using the CKD-EPI (2009) equation, intended for patients 3 years of age and older. If race and/or gender is not documented or "unknown," there will be no eGFR calculation.   ETHANOL, SERUM - Abnormal; Notable for the following components:    ETHANOL 13 (*)     All other components within normal limits    Narrative:     HEM:20,ICT:<2,TURB:<20   PT/INR - Abnormal; Notable for the following components:    INR 1.13 (*)     All other components within normal limits   CBC WITH DIFF - Abnormal; Notable for the following components:    MCH 34.4 (*)     RDW 15.3 (*)     PLATELETS 86 (*)     NEUTROPHIL % 76 (*)     LYMPHOCYTE % 12 (*)     LYMPHOCYTE # 1.00 (*)     MONOCYTE # 0.90 (*)     All other components within normal limits   LIPASE - Normal    Narrative:     HEM:20,ICT:<2,TURB:<20   PTT (PARTIAL THROMBOPLASTIN TIME) - Normal   TROPONIN-I - Normal    Narrative:      HEM:19,ICT:<2,TURB:<20   CBC/DIFF    Narrative:     The following orders were created for panel order CBC/DIFF.  Procedure                               Abnormality         Status                     ---------                               -----------         ------                     CBC WITH MVHQ[[id number]]                Abnormal            Final result                 Please view results for these tests on the individual orders.   VENOUS BLOOD GAS WITH LACTATE REFLEX   URINALYSIS MACROSCOPIC WITH REFLEX TO MICROSCOPIC URINALYSIS (CULTURE NOT PERFORMED)    Narrative:     The following orders were created for panel order URINALYSIS MACROSCOPIC WITH REFLEX TO MICROSCOPIC URINALYSIS (CULTURE NOT PERFORMED).  Procedure                               Abnormality         Status                     ---------                               -----------         ------  URINALYSIS, MACRO/MICRO[[id number]]                                                       Please view results for these tests on the individual orders.   DRUG SCREEN, NO CONFIRMATION, URINE   URINALYSIS, MACRO/MICRO   EXTRA TUBES    Narrative:     The following orders were created for panel order EXTRA TUBES.  Procedure                               Abnormality         Status                     ---------                               -----------         ------                     LIGHT GREEN TOP NLGX[[id number]]                             In process                   Please view results for these tests on the individual orders.   LIGHT GREEN TOP TUBE   SCAN DIFFERENTIAL   ARTERIAL BLOOD GAS, CO-OX, LYTES, LACTATE REFLEX   TYPE AND SCREEN   ABO/RH 2ND SAMPLE       Imaging:  CT LUMBAR SPINE WO IV CONTRAST   Final Result by Edi, Radresults In (05/18 1021)   NO ACUTE LUMBAR FRACTURE.  DEGENERATIVE CHANGES.         One or more dose reduction techniques were used (e.g., Automated exposure control, adjustment of the mA and/or kV according to  patient size, use of iterative reconstruction technique).         Radiologist location ID: [id number]         CT THORACIC SPINE WO IV CONTRAST   Final Result by Edi, Radresults In (05/18 1021)   1.Multiple medial and posterior left rib fractures as described. There are possible transverse process fractures at the T5 and T6 levels on the left as well   2.Small medial left pneumothorax. There is soft tissue air on the left. There is a left basal infiltrate and small left effusion         One or more dose reduction techniques were used (e.g., Automated exposure control, adjustment of the mA and/or kV according to patient size, use of iterative reconstruction technique).         Radiologist location ID: [id number]         CT CERVICAL SPINE WO IV CONTRAST   Final Result by Edi, Radresults In (05/18 1019)   NO ACUTE CERVICAL FRACTURE.  DEGENERATIVE CHANGES.         One or more dose reduction techniques were used (e.g., Automated exposure control, adjustment of the mA and/or kV according to patient size, use of iterative reconstruction technique).         Radiologist location ID: [id number]  TRAUMA CTA CHEST W CT ABDOMEN PELVIS W IV CONTRAST   Final Result by Edi, Radresults In (05/18 1021)   Addendum 1 of 1 by Edi, Radresults In (05/18 1021)   ADDENDUM:      A Critical Document Only message has been documented for [place] Schyler Counsell in    the [employer] - [employer] Actionable Findings system on 05/01/2021    10:19 AM, Message ID [id number].         Radiologist location ID: [id number]         Final   Grade 3 Splenic Trauma, per the American Association for the Surgery of Trauma (AAST) injury grading system, as described above.      Cirrhosis with evidence of portal hypertension      Small left pneumothorax measuring less than 5%      Atelectatic changes at the lung bases with greater the right with a trace left effusion.      Multiple displaced and comminuted left rib fractures.   Subcutaneous air left chest  wall extending into the left flank.         One or more dose reduction techniques were used (e.g., Automated exposure control, adjustment of the mA and/or kV according to patient size, use of iterative reconstruction technique).         Radiologist location ID: [id number]         CT BRAIN WO IV CONTRAST   Final Result by Edi, Radresults In (05/18 0949)   No evidence of acute intracranial injury or change.         One or more dose reduction techniques were used (e.g., Automated exposure control, adjustment of the mA and/or kV according to patient size, use of iterative reconstruction technique).         Radiologist location ID: [id number]         XR AP MOBILE CHEST   Final Result by Edi, Radresults In (05/18 0933)   Bilateral left greater than right acute on chronic infiltrates suggested with possible left effusion. Recommend follow-up.         Radiologist location ID: [id number]             EKG: NSR HR 80 NO ACUTE CHANGES    MDM/Course:  Patient was 90% on room air placed on I nasal cannula oxygen without significant improvement so he is placed on a non-rebreather face mask.  His he was made a P3 trauma initially but without hypoxia was upgraded to a P2 trauma.  His head trauma chest x-ray showed multiple posterior rib fractures in the left but no obvious pneumothorax.  Patient was taken directly to CT.  Fast ultrasound in the exam room was negative for any obvious free fluid.  CT show the patient has no acute injuries to the head or neck.  CT of the chest shows multiple left-sided rib fractures, a grade 3 splenic laceration, small less than 5% pneumothorax on the left, left-sided T5/T6 transverse process fractures.  Trauma cleared his C-spine while in the emergency department.  His hemoglobin was 15. Pain controlled with IV fentanyl and O2 sat stable with non-rebreather face mask.  Plan is to admit to the step-down under the Trauma Service for further evaluation and management.             Clinical  Impression:     Encounter Diagnoses   Name Primary?   . Multiple rib fractures Yes   . Hypoxia    . Closed head injury    .  Blunt abdominal trauma    . Splenic laceration        Medications given:  Medications Administered in the ED   fentaNYL (SUBLIMAZE) 50 mcg/mL injection (50 mcg Intravenous Given 05/01/21 0955)   NS bolus infusion 1,000 mL (1,000 mL Intravenous New Bag/New Syringe 05/01/21 1000)           Disposition: Admitted       Current Discharge Medication List      You have not been prescribed any medications.         Follow up:    No follow-up provider specified.    Parts of this patients chart were completed in a retrospective fashion due to simultaneous direct patient care activities in the Emergency Department.   This note was partially generated using MModal Fluency Direct system, and there may be some incorrect words, spellings, and punctuation that were not noted in checking the note before saving.      Critical Care Time:  Total critical care time spent in direct care of this patient at high risk based on presenting history/exam/and complaint, including initial evaluation and stabilization, review of data, re-examination, discussion with admitting and consulting services to arrange definitive care, and exclusive of any procedures performed, was 30 minutes.
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
## 2021-05-01 NOTE — Progress Notes (Signed)
I reviewed the CT findings.  The patient does have multiple rib fractures.  The pneumothorax is less than 5% to this will be monitored with daily chest x-rays and we will have a low threshold to place a chest tube if needed.  Also in regards to the grade 3 splenic injury I did review the imaging with our vascular surgeon.  The plan moving forward will be for Q 8 hour serial H&Hs.  If the patient becomes hypotensive or if his hemoglobin starts to drop then we will transfuse as needed and embolize.  Vascular surgery is aware and will be available if needed.    Clinton Quant, MD  05/01/2021, 10:27                TRAUMA CTA CHEST W CT ABDOMEN PELVIS W IV CONTRAST performed on 05/01/2021 9:54 AM    CLINICAL HISTORY: fall.  FALL    TECHNIQUE: Chest CTA with intravenous contrast and 3D reconstructions.  Abdomen and pelvis CT using the same contrast dose.  CONTRAST:  110 ml's of Isovue 300    COMPARISON: None.  # of known CTs in the past 12 months: 4   # of known Cardiac Nuclear Medicine Studies in the past 12 months: 0         FINDINGS:  CHEST:  Lines and tubes:  None.    Mediastinum:  No evidence of mediastinal hemorrhage.    Heart:  Cardiomegaly.  No pericardial effusion.    Thoracic Aorta:  No evidence of acute traumatic aortic injury.    Lungs and Airways:  Consolidative changes at both lung bases left greater than right consistent with atelectatic lung. Mild emphysematous changes are present.    Pleura: Small left pneumothorax measuring less than 5%. There is a trace left effusion.    Bones:   Multiple left rib fractures that are displaced and comminuted. Moderate amount of subcutaneous air in the left chest wall extending into the left flank.      ABDOMEN AND PELVIS:  Liver:   Nodular contour of the surface of the liver consistent with cirrhosis. Caudate is enlarged. There is no focal liver lesion.    Gallbladder:   Multiple gallstones.    Spleen:   Extending from the left inferior lateral aspect of  the spleen towards the central spleen is a hypodense area consistent. This measures about 4 cm in length with a blush of active bleeding centrally. this is consistent with a grade 3 injury to the spleen.    Pancreas:   Unremarkable.    Adrenals:   Unremarkable.    Kidneys:   Unremarkable.    Bladder:  Unremarkable.    Prostate:  Unremarkable.    Bowel:   There is no dilated bowel.    Vasculature:   Mild diffuse atherosclerotic calcifications are noted. There are splenic and retroperitoneal varices . Esophageal and gastric varices also present.    Peritoneum / Retroperitoneum: No free fluid.  No free air.    Bones:   No acute osseous abnormality identified.      IMPRESSION:  Grade 3 Splenic Trauma, per the American Association for the Surgery of Trauma (AAST) injury grading system, as described above.    Cirrhosis with evidence of portal hypertension    Small left pneumothorax measuring less than 5%    Atelectatic changes at the lung bases with greater the right with a trace left effusion.    Multiple displaced and comminuted left rib fractures.  Subcutaneous air  left chest wall extending into the left flank.
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
## 2021-05-01 NOTE — Progress Notes (Signed)
Peers were asked by social services to go see pt. Pt is SBIRT positive for ETOH. Peers did go see pt today and attempt a BI but pt is unable to participate today. Peers were able to find out per pt that he had quit drinking about 3 or 4 years ago and the started back up bc of the pandemic. Pt is on an oxygen mask and can not speak loudly. Pt states that talking causes him pain right now and that he is not feeling well. Pt asked that peers come back another time. Peers will attempt to complete a BI tomorrow.

## 2021-05-01 NOTE — ED Nurses Note (Signed)
Dr. Gale Journey at bedside, giving girlfriend and patient update

## 2021-05-02 ENCOUNTER — Inpatient Hospital Stay (HOSPITAL_COMMUNITY): Payer: MEDICAID

## 2021-05-02 ENCOUNTER — Ambulatory Visit (HOSPITAL_COMMUNITY): Payer: MEDICAID

## 2021-05-02 ENCOUNTER — Encounter (HOSPITAL_COMMUNITY): Payer: Self-pay | Admitting: Surgery

## 2021-05-02 DIAGNOSIS — F17210 Nicotine dependence, cigarettes, uncomplicated: Secondary | ICD-10-CM

## 2021-05-02 DIAGNOSIS — R0602 Shortness of breath: Secondary | ICD-10-CM

## 2021-05-02 DIAGNOSIS — S271XXA Traumatic hemothorax, initial encounter: Secondary | ICD-10-CM

## 2021-05-02 DIAGNOSIS — S22009A Unspecified fracture of unspecified thoracic vertebra, initial encounter for closed fracture: Secondary | ICD-10-CM

## 2021-05-02 DIAGNOSIS — R5383 Other fatigue: Secondary | ICD-10-CM

## 2021-05-02 LAB — CBC
HCT: 41.1 % (ref 36.0–46.0)
HGB: 13.9 g/dL (ref 13.9–16.3)
MCH: 34 pg (ref 25.4–34.0)
MCHC: 33.8 g/dL (ref 30.0–37.0)
MCV: 100.6 fL — ABNORMAL HIGH (ref 80.0–100.0)
MPV: 9.3 fL (ref 7.5–11.5)
PLATELETS: 61 10*3/uL — ABNORMAL LOW (ref 130–400)
RBC: 4.09 10*6/uL — ABNORMAL LOW (ref 4.30–5.90)
RDW: 15.5 % — ABNORMAL HIGH (ref 11.5–14.0)
WBC: 7.8 10*3/uL (ref 4.5–11.5)

## 2021-05-02 LAB — BASIC METABOLIC PANEL
ANION GAP: 4 mmol/L — ABNORMAL LOW (ref 5–19)
BUN/CREA RATIO: 21 — ABNORMAL HIGH (ref 6–20)
BUN: 9 mg/dL (ref 9–20)
CALCIUM: 7.9 mg/dL — ABNORMAL LOW (ref 8.4–10.2)
CHLORIDE: 107 mmol/L (ref 98–107)
CO2 TOTAL: 24 mmol/L (ref 22–30)
CREATININE: 0.43 mg/dL — ABNORMAL LOW (ref 0.66–1.20)
ESTIMATED GFR: >60 mL/min/{1.73_m2} (ref >60)
GLUCOSE: 110 mg/dL — ABNORMAL HIGH (ref 74–106)
POTASSIUM: 4.3 mmol/L (ref 3.5–5.1)
SODIUM: 135 mmol/L — ABNORMAL LOW (ref 137–145)

## 2021-05-02 LAB — URINE HOLD

## 2021-05-02 LAB — DRUG SCREEN, NO CONFIRMATION, URINE
AMPHETAMINES URINE: NEGATIVE
BARBITURATES URINE: NEGATIVE
BENZODIAZEPINES URINE: NEGATIVE
BUPRENORPHINE URINE: NEGATIVE
CANNABINOIDS URINE: POSITIVE — AB
COCAINE METABOLITES URINE: NEGATIVE
FENTANYL, RANDOM URINE: POSITIVE — AB
METHADONE URINE: NEGATIVE
OPIATES URINE (LOW CUTOFF): NEGATIVE
OXYCODONE URINE: NEGATIVE
PCP URINE: NEGATIVE

## 2021-05-02 LAB — ARTERIAL BLOOD GAS, CO-OX, LYTES, LACTATE REFLEX
%FIO2 (ARTERIAL): 100 %
BASE DEFICIT: 0.3 mmol/L (ref 0.0–3.0)
BICARBONATE (ARTERIAL): 24.6 mmol/L (ref 18.0–26.0)
CARBOXYHEMOGLOBIN: 2.5 % (ref 0.0–2.5)
CHLORIDE: 104 mmol/L (ref 101–111)
GLUCOSE: 121 mg/dL — ABNORMAL HIGH (ref 60–105)
HEMATOCRITRT: 45 %
HEMOGLOBIN: 14.9 g/dL (ref 12.0–18.0)
IONIZED CALCIUM: 1.13 mmol/L (ref 1.10–1.35)
LACTATE: 1.1 mmol/L (ref 0.0–1.3)
MET-HEMOGLOBIN: 1.9 % (ref 0.0–2.0)
O2CT: 20.3 % (ref 15.7–24.3)
OXYHEMOGLOBIN: 95.6 % (ref 85.0–98.0)
PAO2/FIO2 RATIO: 161 (ref <=200)
PCO2 (ARTERIAL): 52 mm/Hg — ABNORMAL HIGH (ref 35–45)
PH (ARTERIAL): 7.32 — ABNORMAL LOW (ref 7.35–7.45)
PO2 (ARTERIAL): 161 mm/Hg — ABNORMAL HIGH (ref 72–100)
SODIUM: 134 mmol/L — ABNORMAL LOW (ref 137–145)
WHOLE BLOOD POTASSIUM: 3.7 mmol/L (ref 3.5–4.6)

## 2021-05-02 LAB — URINALYSIS, MACRO/MICRO
BILIRUBIN: NEGATIVE mg/dL
GLUCOSE: NEGATIVE mg/dL
KETONES: NEGATIVE mg/dL
NITRITE: NEGATIVE
PH: 6 (ref 5.0–9.0)
PROTEIN: 20 mg/dL
SPECIFIC GRAVITY: 1.027 (ref 1.003–1.035)
UROBILINOGEN: 2 mg/dL — AB (ref <2.0)

## 2021-05-02 LAB — PROCALCITONIN: PROCALCITONIN: 0.1 ng/mL — ABNORMAL HIGH (ref 0.00–0.08)

## 2021-05-02 LAB — HGA1C (HEMOGLOBIN A1C WITH EST AVG GLUCOSE)
ESTIMATED AVERAGE GLUCOSE: 105 mg/dL
HEMOGLOBIN A1C: 5.3 % (ref 4.0–6.0)

## 2021-05-02 LAB — RESPIRATORY CULTURE AND GRAM STAIN (PERFORMABLE)

## 2021-05-02 LAB — THYROID STIMULATING HORMONE WITH FREE T4 REFLEX: TSH: 4.13 u[IU]/mL (ref 0.465–4.680)

## 2021-05-02 LAB — H & H
HCT: 39.1 % (ref 36.0–46.0)
HCT: 39.3 % (ref 36.0–46.0)
HCT: 41 % (ref 36.0–46.0)
HGB: 13 g/dL — ABNORMAL LOW (ref 13.9–16.3)
HGB: 13.3 g/dL — ABNORMAL LOW (ref 13.9–16.3)
HGB: 13.9 g/dL (ref 13.9–16.3)

## 2021-05-02 LAB — STREPTOCOCCUS PNEUMONIAE ANTIGEN,URINE: S.PNEUMONIA ANTIGEN: NEGATIVE

## 2021-05-02 LAB — LEGIONELLA URINE ANTIGEN: LEGIONELLA ANTIGEN: NEGATIVE

## 2021-05-02 LAB — MICRO HOLD

## 2021-05-02 LAB — MAGNESIUM: MAGNESIUM: 1.8 mg/dL (ref 1.6–2.3)

## 2021-05-02 LAB — MRSA SCREEN, PCR, RAPID: MRSA COLONIZATION SCREEN: NEGATIVE

## 2021-05-02 LAB — PHOSPHORUS: PHOSPHORUS: 2.7 mg/dL (ref 2.5–4.5)

## 2021-05-02 MED ORDER — DOCUSATE SODIUM 50 MG/5 ML ORAL LIQUID
100.0000 mg | Freq: Two times a day (BID) | ORAL | Status: DC
Start: 2021-05-02 — End: 2021-05-30
  Administered 2021-05-02 – 2021-05-19 (×36): 100 mg via GASTROSTOMY
  Administered 2021-05-20: 0 mg via GASTROSTOMY
  Administered 2021-05-20: 100 mg via GASTROSTOMY
  Administered 2021-05-21: 0 mg via GASTROSTOMY
  Administered 2021-05-21 – 2021-05-24 (×7): 100 mg via GASTROSTOMY
  Administered 2021-05-25: 0 mg via GASTROSTOMY
  Administered 2021-05-25 – 2021-05-26 (×2): 100 mg via GASTROSTOMY
  Administered 2021-05-26: 0 mg via GASTROSTOMY
  Administered 2021-05-27 (×2): 100 mg via GASTROSTOMY
  Administered 2021-05-28: 0 mg via GASTROSTOMY
  Administered 2021-05-28 – 2021-05-29 (×2): 100 mg via GASTROSTOMY
  Filled 2021-05-02 (×52): qty 10

## 2021-05-02 MED ORDER — PROPOFOL 10 MG/ML INTRAVENOUS EMULSION
INTRAVENOUS | Status: AC
Start: 2021-05-02 — End: 2021-05-02
  Administered 2021-05-02: 10 ug/kg/min
  Filled 2021-05-02: qty 100

## 2021-05-02 MED ORDER — PHENOBARBITAL SODIUM 130 MG/ML INJECTION SOLUTION
60.0000 mg | Freq: Three times a day (TID) | INTRAMUSCULAR | Status: AC
Start: 2021-05-04 — End: 2021-05-06
  Administered 2021-05-04 – 2021-05-06 (×7): 60 mg via INTRAVENOUS
  Filled 2021-05-02 (×6): qty 1

## 2021-05-02 MED ORDER — SODIUM CHLORIDE 0.9 % INTRAVENOUS SOLUTION
500.0000 mg | INTRAVENOUS | Status: DC
Start: 2021-05-02 — End: 2021-05-06
  Administered 2021-05-02: 0 mg via INTRAVENOUS
  Administered 2021-05-02: 500 mg via INTRAVENOUS
  Administered 2021-05-03: 0 mg via INTRAVENOUS
  Administered 2021-05-03: 500 mg via INTRAVENOUS
  Administered 2021-05-04: 0 mg via INTRAVENOUS
  Administered 2021-05-04: 500 mg via INTRAVENOUS
  Administered 2021-05-05: 0 mg via INTRAVENOUS
  Administered 2021-05-05 – 2021-05-06 (×2): 500 mg via INTRAVENOUS
  Administered 2021-05-06: 0 mg via INTRAVENOUS
  Filled 2021-05-02 (×6): qty 5

## 2021-05-02 MED ORDER — PHENOBARBITAL SODIUM 130 MG/ML INJECTION SOLUTION
30.0000 mg | Freq: Three times a day (TID) | INTRAMUSCULAR | Status: AC
Start: 2021-05-06 — End: 2021-05-08
  Administered 2021-05-06 – 2021-05-08 (×6): 30 mg via INTRAVENOUS
  Filled 2021-05-02 (×6): qty 1

## 2021-05-02 MED ORDER — FAMOTIDINE (PF) 20 MG/2 ML INTRAVENOUS SOLUTION
20.0000 mg | Freq: Two times a day (BID) | INTRAVENOUS | Status: DC
Start: 2021-05-02 — End: 2021-05-03
  Administered 2021-05-02 – 2021-05-03 (×3): 20 mg via INTRAVENOUS
  Filled 2021-05-02 (×3): qty 2

## 2021-05-02 MED ORDER — SODIUM CHLORIDE 0.9 % INTRAVENOUS SOLUTION
25.0000 ug/h | INTRAVENOUS | Status: DC
Start: 2021-05-02 — End: 2021-05-14
  Administered 2021-05-02: 100 ug/h via INTRAVENOUS
  Administered 2021-05-02: 25 ug/h via INTRAVENOUS
  Administered 2021-05-03: 100 ug/h via INTRAVENOUS
  Administered 2021-05-03: 25 ug/h via INTRAVENOUS
  Administered 2021-05-03 – 2021-05-07 (×57): 100 ug/h via INTRAVENOUS
  Administered 2021-05-08 (×4): 125 ug/h via INTRAVENOUS
  Administered 2021-05-08: 150 ug/h via INTRAVENOUS
  Administered 2021-05-08: 125 ug/h via INTRAVENOUS
  Administered 2021-05-08: 25 ug/h via INTRAVENOUS
  Administered 2021-05-08: 150 ug/h via INTRAVENOUS
  Administered 2021-05-09 (×4): 125 ug/h via INTRAVENOUS
  Administered 2021-05-09: 175 ug/h via INTRAVENOUS
  Administered 2021-05-09: 125 ug/h via INTRAVENOUS
  Administered 2021-05-09: 150 ug/h via INTRAVENOUS
  Administered 2021-05-09 – 2021-05-10 (×5): 125 ug/h via INTRAVENOUS
  Administered 2021-05-10: 100 ug/h via INTRAVENOUS
  Administered 2021-05-10 (×7): 125 ug/h via INTRAVENOUS
  Administered 2021-05-10: 100 ug/h via INTRAVENOUS
  Administered 2021-05-10: 125 ug/h via INTRAVENOUS
  Administered 2021-05-10: 100 ug/h via INTRAVENOUS
  Administered 2021-05-10 (×3): 125 ug/h via INTRAVENOUS
  Administered 2021-05-10: 25 ug/h via INTRAVENOUS
  Administered 2021-05-10: 125 ug/h via INTRAVENOUS
  Administered 2021-05-11: 175 ug/h via INTRAVENOUS
  Administered 2021-05-11 (×2): 125 ug/h via INTRAVENOUS
  Administered 2021-05-11 (×2): 175 ug/h via INTRAVENOUS
  Administered 2021-05-11 (×3): 200 ug/h via INTRAVENOUS
  Administered 2021-05-11 (×2): 175 ug/h via INTRAVENOUS
  Administered 2021-05-11: 200 ug/h via INTRAVENOUS
  Administered 2021-05-11: 125 ug/h via INTRAVENOUS
  Administered 2021-05-11: 200 ug/h via INTRAVENOUS
  Administered 2021-05-11: 100 ug/h via INTRAVENOUS
  Administered 2021-05-11: 200 ug/h via INTRAVENOUS
  Administered 2021-05-11: 175 ug/h via INTRAVENOUS
  Administered 2021-05-11: 200 ug/h via INTRAVENOUS
  Administered 2021-05-11 (×2): 175 ug/h via INTRAVENOUS
  Administered 2021-05-11: 200 ug/h via INTRAVENOUS
  Administered 2021-05-11: 175 ug/h via INTRAVENOUS
  Administered 2021-05-12 (×2): 200 ug/h via INTRAVENOUS
  Administered 2021-05-12 (×2): 175 ug/h via INTRAVENOUS
  Administered 2021-05-12 (×4): 200 ug/h via INTRAVENOUS
  Administered 2021-05-13 (×2): 125 ug/h via INTRAVENOUS
  Administered 2021-05-13 (×2): 200 ug/h via INTRAVENOUS
  Administered 2021-05-13: 150 ug/h via INTRAVENOUS
  Administered 2021-05-13: 200 ug/h via INTRAVENOUS
  Administered 2021-05-13: 100 ug/h via INTRAVENOUS
  Administered 2021-05-13: 200 ug/h via INTRAVENOUS
  Administered 2021-05-13 (×2): 150 ug/h via INTRAVENOUS
  Administered 2021-05-14 (×2): 50 ug/h via INTRAVENOUS
  Administered 2021-05-14: 75 ug/h via INTRAVENOUS
  Administered 2021-05-14: 0 ug/h via INTRAVENOUS
  Administered 2021-05-14: 50 ug/h via INTRAVENOUS
  Administered 2021-05-14: 25 ug/h via INTRAVENOUS
  Filled 2021-05-02 (×41): qty 25

## 2021-05-02 MED ORDER — NYSTATIN 100,000 UNIT/GRAM TOPICAL POWDER
Freq: Two times a day (BID) | CUTANEOUS | Status: DC
Start: 2021-05-02 — End: 2021-05-30
  Filled 2021-05-02 (×13): qty 15

## 2021-05-02 MED ORDER — SODIUM CHLORIDE 0.9 % INTRAVENOUS PIGGYBACK
1.5000 g | Freq: Four times a day (QID) | INTRAVENOUS | Status: DC
Start: 2021-05-02 — End: 2021-05-04
  Administered 2021-05-02: 0 g via INTRAVENOUS
  Administered 2021-05-02 (×3): 1.5 g via INTRAVENOUS
  Administered 2021-05-02 (×2): 0 g via INTRAVENOUS
  Administered 2021-05-03 (×3): 1.5 g via INTRAVENOUS
  Administered 2021-05-03 (×2): 0 g via INTRAVENOUS
  Administered 2021-05-03: 1.5 g via INTRAVENOUS
  Administered 2021-05-03 (×2): 0 g via INTRAVENOUS
  Administered 2021-05-04: 1.5 g via INTRAVENOUS
  Administered 2021-05-04 (×2): 0 g via INTRAVENOUS
  Administered 2021-05-04: 1.5 g via INTRAVENOUS
  Filled 2021-05-02 (×11): qty 4

## 2021-05-02 MED ORDER — SODIUM CHLORIDE 0.9 % (FLUSH) INJECTION SYRINGE
10.0000 mL | INJECTION | Freq: Three times a day (TID) | INTRAMUSCULAR | Status: DC
Start: 2021-05-02 — End: 2021-05-30
  Administered 2021-05-02 – 2021-05-03 (×5): 10 mL via INTRAVENOUS
  Administered 2021-05-03: 0 mL via INTRAVENOUS
  Administered 2021-05-03 – 2021-05-06 (×10): 10 mL via INTRAVENOUS
  Administered 2021-05-07: 0 mL via INTRAVENOUS
  Administered 2021-05-07 – 2021-05-08 (×5): 10 mL via INTRAVENOUS
  Administered 2021-05-08 – 2021-05-09 (×2): 0 mL via INTRAVENOUS
  Administered 2021-05-09 – 2021-05-12 (×10): 10 mL via INTRAVENOUS
  Administered 2021-05-12: 0 mL via INTRAVENOUS
  Administered 2021-05-13 (×3): 10 mL via INTRAVENOUS
  Administered 2021-05-14: 0 mL via INTRAVENOUS
  Administered 2021-05-14: 10 mL via INTRAVENOUS
  Administered 2021-05-14 – 2021-05-15 (×2): 0 mL via INTRAVENOUS
  Administered 2021-05-15 – 2021-05-16 (×5): 10 mL via INTRAVENOUS
  Administered 2021-05-17: 0 mL via INTRAVENOUS
  Administered 2021-05-17 – 2021-05-18 (×3): 10 mL via INTRAVENOUS
  Administered 2021-05-18: 0 mL via INTRAVENOUS
  Administered 2021-05-18 – 2021-05-19 (×2): 10 mL via INTRAVENOUS
  Administered 2021-05-19: 0 mL via INTRAVENOUS
  Administered 2021-05-19: 10 mL via INTRAVENOUS
  Administered 2021-05-20: 0 mL via INTRAVENOUS
  Administered 2021-05-20 – 2021-05-21 (×4): 10 mL via INTRAVENOUS
  Administered 2021-05-21: 0 mL via INTRAVENOUS
  Administered 2021-05-22: 10 mL via INTRAVENOUS
  Administered 2021-05-22: 0 mL via INTRAVENOUS
  Administered 2021-05-22 – 2021-05-24 (×6): 10 mL via INTRAVENOUS
  Administered 2021-05-24: 0 mL via INTRAVENOUS
  Administered 2021-05-25 – 2021-05-28 (×12): 10 mL via INTRAVENOUS
  Administered 2021-05-29: 0 mL via INTRAVENOUS
  Administered 2021-05-29: 10 mL via INTRAVENOUS

## 2021-05-02 MED ORDER — PHENOBARBITAL SODIUM 130 MG/ML INJECTION SOLUTION
90.0000 mg | Freq: Three times a day (TID) | INTRAMUSCULAR | Status: AC
Start: 2021-05-02 — End: 2021-05-04
  Administered 2021-05-02 – 2021-05-04 (×6): 90 mg via INTRAVENOUS
  Filled 2021-05-02 (×6): qty 1

## 2021-05-02 MED ORDER — SENNOSIDES 8.6 MG TABLET
8.6000 mg | ORAL_TABLET | Freq: Two times a day (BID) | ORAL | Status: DC
Start: 2021-05-02 — End: 2021-05-30
  Administered 2021-05-02 – 2021-05-20 (×37): 8.6 mg via ORAL
  Administered 2021-05-20 – 2021-05-21 (×2): 0 mg via ORAL
  Administered 2021-05-21 – 2021-05-25 (×8): 8.6 mg via ORAL
  Administered 2021-05-25 – 2021-05-26 (×2): 0 mg via ORAL
  Administered 2021-05-26 – 2021-05-28 (×4): 8.6 mg via ORAL
  Administered 2021-05-28: 0 mg via ORAL
  Administered 2021-05-29: 8.6 mg via ORAL
  Filled 2021-05-02 (×51): qty 1

## 2021-05-02 MED ORDER — CHLORHEXIDINE GLUCONATE 0.12 % MOUTHWASH
15.0000 mL | MOUTHWASH | Freq: Two times a day (BID) | Status: DC
Start: 2021-05-02 — End: 2021-05-30
  Administered 2021-05-02 – 2021-05-23 (×44): 15 mL via ORAL
  Administered 2021-05-24 – 2021-05-25 (×3): 0 mL via ORAL
  Administered 2021-05-25: 15 mL via ORAL
  Administered 2021-05-26: 0 mL via ORAL
  Administered 2021-05-26: 15 mL via ORAL
  Administered 2021-05-27: 0 mL via ORAL
  Administered 2021-05-27 – 2021-05-28 (×2): 15 mL via ORAL
  Administered 2021-05-28: 0 mL via ORAL
  Administered 2021-05-29: 15 mL via ORAL
  Filled 2021-05-02 (×45): qty 15

## 2021-05-02 MED ORDER — PROPOFOL 10 MG/ML INTRAVENOUS EMULSION
10.0000 ug/kg/min | INTRAVENOUS | Status: DC
Start: 2021-05-02 — End: 2021-05-10
  Administered 2021-05-02 (×2): 50 ug/kg/min via INTRAVENOUS
  Administered 2021-05-02: 10 ug/kg/min via INTRAVENOUS
  Administered 2021-05-03: 40 ug/kg/min via INTRAVENOUS
  Administered 2021-05-03: 20 ug/kg/min via INTRAVENOUS
  Administered 2021-05-03: 0 ug/kg/min via INTRAVENOUS
  Administered 2021-05-03: 10 ug/kg/min via INTRAVENOUS
  Administered 2021-05-03: 30 ug/kg/min via INTRAVENOUS
  Administered 2021-05-03 (×2): 25 ug/kg/min via INTRAVENOUS
  Administered 2021-05-03: 20 ug/kg/min via INTRAVENOUS
  Administered 2021-05-03 (×2): 10 ug/kg/min via INTRAVENOUS
  Administered 2021-05-03: 25 ug/kg/min via INTRAVENOUS
  Administered 2021-05-03: 30 ug/kg/min via INTRAVENOUS
  Administered 2021-05-03: 50 ug/kg/min via INTRAVENOUS
  Administered 2021-05-03: 40 ug/kg/min via INTRAVENOUS
  Administered 2021-05-04 (×6): 35 ug/kg/min via INTRAVENOUS
  Administered 2021-05-04: 25 ug/kg/min via INTRAVENOUS
  Administered 2021-05-05: 35 ug/kg/min via INTRAVENOUS
  Administered 2021-05-05 (×2): 50 ug/kg/min via INTRAVENOUS
  Administered 2021-05-05: 35 ug/kg/min via INTRAVENOUS
  Administered 2021-05-05: 50 ug/kg/min via INTRAVENOUS
  Administered 2021-05-05: 40 ug/kg/min via INTRAVENOUS
  Administered 2021-05-05: 45 ug/kg/min via INTRAVENOUS
  Administered 2021-05-05: 35 ug/kg/min via INTRAVENOUS
  Administered 2021-05-05 (×4): 50 ug/kg/min via INTRAVENOUS
  Administered 2021-05-05: 35 ug/kg/min via INTRAVENOUS
  Administered 2021-05-05: 40 ug/kg/min via INTRAVENOUS
  Administered 2021-05-05: 35 ug/kg/min via INTRAVENOUS
  Administered 2021-05-05: 45 ug/kg/min via INTRAVENOUS
  Administered 2021-05-05: 40 ug/kg/min via INTRAVENOUS
  Administered 2021-05-05: 35 ug/kg/min via INTRAVENOUS
  Administered 2021-05-05 (×2): 50 ug/kg/min via INTRAVENOUS
  Administered 2021-05-05: 35 ug/kg/min via INTRAVENOUS
  Administered 2021-05-05 (×2): 50 ug/kg/min via INTRAVENOUS
  Administered 2021-05-05 (×2): 35 ug/kg/min via INTRAVENOUS
  Administered 2021-05-05: 40 ug/kg/min via INTRAVENOUS
  Administered 2021-05-05: 45 ug/kg/min via INTRAVENOUS
  Administered 2021-05-05: 10 ug/kg/min via INTRAVENOUS
  Administered 2021-05-05 (×2): 50 ug/kg/min via INTRAVENOUS
  Administered 2021-05-05 (×2): 35 ug/kg/min via INTRAVENOUS
  Administered 2021-05-06 (×6): 50 ug/kg/min via INTRAVENOUS
  Administered 2021-05-06: 10 ug/kg/min via INTRAVENOUS
  Administered 2021-05-06 – 2021-05-08 (×30): 50 ug/kg/min via INTRAVENOUS
  Administered 2021-05-08: 10 ug/kg/min via INTRAVENOUS
  Administered 2021-05-08: 50 ug/kg/min via INTRAVENOUS
  Administered 2021-05-09: 40 ug/kg/min via INTRAVENOUS
  Administered 2021-05-09: 50 ug/kg/min via INTRAVENOUS
  Administered 2021-05-09 (×2): 40 ug/kg/min via INTRAVENOUS
  Administered 2021-05-09: 50 ug/kg/min via INTRAVENOUS
  Administered 2021-05-09: 10 ug/kg/min via INTRAVENOUS
  Administered 2021-05-09: 25 ug/kg/min via INTRAVENOUS
  Administered 2021-05-09 (×3): 40 ug/kg/min via INTRAVENOUS
  Administered 2021-05-09 (×2): 50 ug/kg/min via INTRAVENOUS
  Administered 2021-05-09: 40 ug/kg/min via INTRAVENOUS
  Administered 2021-05-09: 30 ug/kg/min via INTRAVENOUS
  Administered 2021-05-09 – 2021-05-10 (×5): 40 ug/kg/min via INTRAVENOUS
  Administered 2021-05-10: 10 ug/kg/min via INTRAVENOUS
  Administered 2021-05-10 (×4): 40 ug/kg/min via INTRAVENOUS
  Filled 2021-05-02 (×43): qty 100

## 2021-05-02 MED ORDER — SODIUM CHLORIDE 0.9 % (FLUSH) INJECTION SYRINGE
20.0000 mL | INJECTION | INTRAMUSCULAR | Status: DC | PRN
Start: 2021-05-02 — End: 2021-05-30

## 2021-05-02 MED ORDER — IPRATROPIUM 0.5 MG-ALBUTEROL 3 MG (2.5 MG BASE)/3 ML NEBULIZATION SOLN
3.0000 mL | INHALATION_SOLUTION | RESPIRATORY_TRACT | Status: DC
Start: 2021-05-02 — End: 2021-05-30
  Administered 2021-05-02 – 2021-05-18 (×97): 3 mL via RESPIRATORY_TRACT
  Administered 2021-05-18: 0 mL via RESPIRATORY_TRACT
  Administered 2021-05-18 – 2021-05-21 (×17): 3 mL via RESPIRATORY_TRACT
  Administered 2021-05-21: 0 mL via RESPIRATORY_TRACT
  Administered 2021-05-21 – 2021-05-23 (×16): 3 mL via RESPIRATORY_TRACT
  Administered 2021-05-24: 0 mL via RESPIRATORY_TRACT
  Administered 2021-05-24 – 2021-05-25 (×6): 3 mL via RESPIRATORY_TRACT
  Administered 2021-05-25: 0 mL via RESPIRATORY_TRACT
  Administered 2021-05-25 (×2): 3 mL via RESPIRATORY_TRACT
  Administered 2021-05-25: 0 mL via RESPIRATORY_TRACT
  Administered 2021-05-26: 3 mL via RESPIRATORY_TRACT
  Administered 2021-05-26 (×2): 0 mL via RESPIRATORY_TRACT
  Administered 2021-05-26 – 2021-05-27 (×5): 3 mL via RESPIRATORY_TRACT
  Administered 2021-05-27: 0 mL via RESPIRATORY_TRACT
  Administered 2021-05-27 – 2021-05-28 (×4): 3 mL via RESPIRATORY_TRACT
  Administered 2021-05-28: 0 mL via RESPIRATORY_TRACT
  Administered 2021-05-28: 3 mL via RESPIRATORY_TRACT
  Administered 2021-05-28: 0 mL via RESPIRATORY_TRACT
  Administered 2021-05-28 – 2021-05-29 (×5): 3 mL via RESPIRATORY_TRACT
  Administered 2021-05-29 (×3): 0 mL via RESPIRATORY_TRACT
  Filled 2021-05-02 (×64): qty 3
  Filled 2021-05-02: qty 6
  Filled 2021-05-02 (×16): qty 3
  Filled 2021-05-02: qty 6
  Filled 2021-05-02 (×17): qty 3
  Filled 2021-05-02: qty 9
  Filled 2021-05-02 (×24): qty 3
  Filled 2021-05-02: qty 6
  Filled 2021-05-02 (×19): qty 3
  Filled 2021-05-02: qty 6
  Filled 2021-05-02 (×2): qty 3

## 2021-05-02 NOTE — Care Management Notes (Signed)
[facility] Management Initial Evaluation    Patient Name: Hector [name]  Date of Birth: [birth year]  Sex: male  Date/Time of Admission: 05/01/2021  9:05 AM  Room/Bed: 367/A  Payor: [place] / Plan: [place] / Product Type: Medicaid [place] /   Primary Care Providers:  Pcp, No (General)    Pharmacy Info:   Preferred Pharmacy       None          Emergency Contact Info:   Extended Emergency Contact Information  Primary Emergency Contact: [name]  Address: [address]           [place], [place] [address] [place] Phone: [phone]  Relation: Significant other  Preferred language: English  Interpreter needed? No    History:   Eldwin Volkov is a 60 y.o., male, admitted with alcoholism.    Height/Weight: 182.9 cm (6') / 90 kg (198 lb 6.6 oz)     LOS: 1 day   Admitting Diagnosis: Multiple rib fractures [S22.49XA]    Assessment:   Patient lives at home at home with spouse. Patient denies use of DME or home oxygen. Denies any home accessibility or home safety concerns. Patient is not able to drive but has adequate transportation. No financial concerns stated at this time. Not currently under the care of a home health agency. Care management will continue to follow up developing discharge needs.       Discharge Plan:  Home (Patient/Family Member/other) (code 1)      The patient will continue to be evaluated for developing discharge needs.     Case Manager: Roger Kill, RN         05/02/21 8333   Living Environment   Lives With spouse   Home Safety   Home Assessment: No Problems Identified   Care Management Plan   Discharge Planning Status initial meeting   Discharge Needs Assessment   Discharge Facility/Level of Care Needs Home (Patient/Family Member/other)(code 1)   Referral Information   Admission Type inpatient   Arrived From home or self-care   LAY CAREGIVER    Appointed Lay Caregiver? I Decline

## 2021-05-02 NOTE — Procedures (Signed)
Procedure Note  Endotracheal Intubation    Procedure Date: 05/02/2021 Time: 0900  Procedure: Endotracheal intubation  Indication: Acute hypoxic respiratory failure     Description:  The patient was positioned appropriately.  Preoxygenation with 100% FiO2 provided. Sats of 100% achieved. Etomidate 40 mg and Rocuronium 50 mg IV given. A Macintosh laryngoscope was inserted and the vocal cords identified. Size 7.5 ETT was placed under direct visualization through the cords and placement confirmed by auscultation and ETCO2 monitor. ETT was then secured in place at 24 cm at the lip. Overall, intubation was found to be not difficult and required 2 attempt(s). CXR was ordered to confirm placement; will review once completed.     Dr. Maurine Minister at bedside and assisted with entire procedure.     Letta Median, APRN

## 2021-05-02 NOTE — Progress Notes (Signed)
[facility]    ICU progress note    Patient Name: Hector [name]  Date: 05/02/2021   Department:  [facility]  MRN: [id number]  DOB: July 18, 1961    Active Hospital Problems    Diagnosis   . 1Multiple rib fractures   . Left pulmonary contusion   . Hemopneumothorax on left   . Spleen injury   . Closed fracture of transverse process of thoracic vertebra (CMS HCC)   . 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 Thrombocytopenia (CMS HCC)   . Alcohol abuse         HPI:  Hector [name] is a 60 y.o., male with past medical history of alcoholism and hypertension who presented as a level 2 trauma s/p falling down approximately 11 stairs while intoxicated. He initially refused medical treatment but presented to the ED on 5/18 after awaking later that morning with severe shortness of breath and posterior chest wall pain. He was noted to have bilateral shoulder and left posterior scalp ecchymosis, left forearm skin tear, and a torn right great toe torn toenail. CTA C/A/P revealed a small left pneumothorax <5%. Atelectatasis bilaterally Rt>Lt. Trace left pleural effusion. Multiple displaced and comminuted left rib fractures. SubQ air left chest wall extending into the left flank. Grade 3 Splenic Trauma. Cirrhosis with evidence of portal hypertension. CT thoracic spine showed multiple rib fractures (medial left 4th- 6th and sixth ribs and posterior lateral left 4th-7th . Possible non-displaced left transverse process fractures of T5 and T6. He was admitted to [facility] under the Trauma Service. Communication with Vascular Surgery, although not a formal consult, regarding concern for splenic laceration bleeding. Close monitoring of H+H's.   On 5/19, on assessment he was noted to be lethargic with increased work of breathing, accessory muscle use, and gurgling respirations. There was concern he was unable to clear his secretions. He was transferred to ICU for intubation. On arrival to ICU, he was lethargic but  oriented and answered questions appropriately. On 100% NRB with increased work of breathing and subsequently intubated for airway protection. Hypertension post intubation that improved with sedation.   Per significant other, Hector [name], the patient drinks approximately15 beers and 2 [employer]/ daily but occasionally more. He is functioning in the morning and is able to complete tasks around the house but does not drive or leave the house. He begins drinking in the afternoon and is unable to stand/ walk by night.     PMHx:   Past Medical History:   Diagnosis Date   . HTN (hypertension)    . Unknown cause of injury     fx nose   . Wears glasses        PSHx:  Past Surgical History:   Procedure Laterality Date   . HX TONSILLECTOMY         FHx:  Family History   Problem Relation Name Age of Onset   . Asthma Father          SHx:  Social History     Socioeconomic History   . Marital status: Divorced   Tobacco Use   . Smoking status: Current Every Day Smoker     Packs/day: 1.50   . Smokeless tobacco: Never Used   Vaping Use   . Vaping Use: Never used   Substance and Sexual Activity   . Alcohol use: Yes     Alcohol/week: 15.0 standard drinks     Types: 15 Cans of beer per  week     Comment: daily   . Drug use: Yes     Types: Marijuana     Comment: daily   . Sexual activity: Yes        ROS:  (+) shortness of breath     Imaging:   XR AP MOBILE CHEST   Final Result   1.Decreased left basilar atelectasis or pneumonia. Right basilar atelectasis or pneumonia is unchanged.   2.Left rib fractures without pneumothorax.         Radiologist location ID: [id number]         CT LUMBAR SPINE WO IV CONTRAST   Final Result   NO ACUTE LUMBAR FRACTURE.  DEGENERATIVE CHANGES.         One or more dose reduction techniques were used (e.g., Automated exposure control, adjustment of the mA and/or kV according to patient size, use of iterative reconstruction technique).         Radiologist location ID: [id number]         CT THORACIC SPINE WO IV  CONTRAST   Final Result   1.Multiple medial and posterior left rib fractures as described. There are possible transverse process fractures at the T5 and T6 levels on the left as well   2.Small medial left pneumothorax. There is soft tissue air on the left. There is a left basal infiltrate and small left effusion         One or more dose reduction techniques were used (e.g., Automated exposure control, adjustment of the mA and/or kV according to patient size, use of iterative reconstruction technique).         Radiologist location ID: [id number]         CT CERVICAL SPINE WO IV CONTRAST   Final Result   NO ACUTE CERVICAL FRACTURE.  DEGENERATIVE CHANGES.         One or more dose reduction techniques were used (e.g., Automated exposure control, adjustment of the mA and/or kV according to patient size, use of iterative reconstruction technique).         Radiologist location ID: [id number]         TRAUMA CTA CHEST W CT ABDOMEN PELVIS W IV CONTRAST   Final Result   Addendum 1 of 1   ADDENDUM:      A Critical Document Only message has been documented for [place] ANDERSON in    the [employer] - [employer] Actionable Findings system on 05/01/2021    10:19 AM, Message ID [id number].         Radiologist location ID: [id number]         Final   Grade 3 Splenic Trauma, per the American Association for the Surgery of Trauma (AAST) injury grading system, as described above.      Cirrhosis with evidence of portal hypertension      Small left pneumothorax measuring less than 5%      Atelectatic changes at the lung bases with greater the right with a trace left effusion.      Multiple displaced and comminuted left rib fractures.   Subcutaneous air left chest wall extending into the left flank.         One or more dose reduction techniques were used (e.g., Automated exposure control, adjustment of the mA and/or kV according to patient size, use of iterative reconstruction technique).         Radiologist location ID: [id number]  Final Result   No evidence of acute intracranial injury or change.         One or more dose reduction techniques were used (e.g., Automated exposure control, adjustment of the mA and/or kV according to patient size, use of iterative reconstruction technique).         Radiologist location ID: [id number]         XR AP MOBILE CHEST   Final Result   Bilateral left greater than right acute on chronic infiltrates suggested with possible left effusion. Recommend follow-up.         Radiologist location ID: [place]:  Results for orders placed or performed during the hospital encounter of 05/01/21 (from the past 24 hour(s))   COMPREHENSIVE METABOLIC PANEL, NON-FASTING   Result Value Ref Range    SODIUM 139 137 - 145 mmol/L    POTASSIUM 4.1 3.5 - 5.1 mmol/L    CHLORIDE 110 (H) 98 - 107 mmol/L    CO2 TOTAL 17 (L) 22 - 30 mmol/L    ANION GAP 12 5 - 19 mmol/L    BUN 6 (L) 9 - 20 mg/dL    CREATININE 0.56 (L) 0.66 - 1.20 mg/dL    BUN/CREA RATIO 11 6 - 20    ESTIMATED GFR >60 >60 mL/min/1.40m2    ALBUMIN 4.2 3.5 - 5.0 g/dL    CALCIUM 8.8 8.4 - 10.2 mg/dL    GLUCOSE 153 (H) 74 - 106 mg/dL    ALKALINE PHOSPHATASE 140 (H) 38 - 126 U/L    ALT (SGPT) 61 (H) <50 U/L    AST (SGOT) 86 (H) 17 - 59 U/L    BILIRUBIN TOTAL 1.0 0.2 - 1.3 mg/dL    PROTEIN TOTAL 7.8 6.3 - 8.2 g/dL    ALBUMIN/GLOBULIN RATIO 1.2 (L) 1.5 - 2.5   LIPASE   Result Value Ref Range    LIPASE 156 23 - 300 U/L   ETHANOL, SERUM   Result Value Ref Range    ETHANOL 13 (H) <10 mg/dL   PT/INR   Result Value Ref Range    PROTHROMBIN TIME 11.9 9.0 - 12.0 seconds    INR 1.13 (H) 0.85 - 1.10   PTT (PARTIAL THROMBOPLASTIN TIME)   Result Value Ref Range    APTT 26.8 22.0 - 32.0 seconds   TROPONIN-I   Result Value Ref Range    TROPONIN I <0.01 0.00 - 0.03 ng/mL   CBC WITH DIFF   Result Value Ref Range    WBC 8.2 4.5 - 11.5 x10^3/uL    RBC 4.64 4.30 - 5.90 x10^6/uL    HGB 15.9 13.9 - 16.3 g/dL    HCT 46.0 36.0 - 46.0 %    MCV 99.1  80.0 - 100.0 fL    MCH 34.4 (H) 25.4 - 34.0 pg    MCHC 34.7 30.0 - 37.0 g/dL    RDW 15.3 (H) 11.5 - 14.0 %    PLATELETS 86 (L) 130 - 400 x10^3/uL    MPV 9.6 7.5 - 11.5 fL    NEUTROPHIL % 76 (H) 41 - 69 %    LYMPHOCYTE % 12 (L) 19 - 46 %    MONOCYTE % 11 4 - 12 %    EOSINOPHIL % 0 0 - 5 %    BASOPHIL % 0 0 - 2 %    NEUTROPHIL # 6.30 1.80 - 7.50 x10^3/uL    LYMPHOCYTE #  1.00 (L) 1.10 - 3.80 x10^3/uL    MONOCYTE # 0.90 (H) 0.10 - 0.80 x10^3/uL    EOSINOPHIL # 0.00 0.00 - 0.60 x10^3/uL    BASOPHIL # 0.00 0.00 - 0.20 x10^3/uL   SCAN DIFFERENTIAL   Result Value Ref Range    PLATELET ESTIMATE Decreased     LARGE PLATELETS Present (A) Absent    ANISOCYTOSIS Present (A) Absent    WBC MORPHOLOGY COMMENT Normal     POIKILOCYTOSIS Present (A) Absent   TYPE AND SCREEN   Result Value Ref Range    ABO/RH(D) A Rh Positive     ANTIBODY SCREEN Negative    LIGHT GREEN TOP TUBE   Result Value Ref Range    RAINBOW/EXTRA TUBE AUTO RESULT Yes    ECG 12 LEAD   Result Value Ref Range    Ventricular rate 80 BPM    PR Interval 156 ms    QRS Duration 112 ms    QT Interval 404 ms    QTC Calculation 440 ms    Calculated P Axis 90 degrees    Calculated R Axis 11 degrees    Calculated T Axis 38 degrees   ARTERIAL BLOOD GAS, CO-OX, LYTES, LACTATE REFLEX   Result Value Ref Range    %FIO2 (ARTERIAL) 100 %    PH (ARTERIAL) 7.36 7.35 - 7.45    PCO2 (ARTERIAL) 40 35 - 45 mm/Hg    PO2 (ARTERIAL) 112 (H) 72 - 100 mm/Hg    BASE DEFICIT 2.7 0.0 - 3.0 mmol/L    BICARBONATE (ARTERIAL) 22.6 18.0 - 26.0 mmol/L    SODIUM 133 (L) 137 - 145 mmol/L    WHOLE BLOOD POTASSIUM 3.9 3.5 - 4.6 mmol/L    CHLORIDE 104 101 - 111 mmol/L    IONIZED CALCIUM 1.13 1.10 - 1.35 mmol/L    GLUCOSE 155 (H) 60 - 105 mg/dL    LACTATE 1.8 (H) 0.0 - 1.3 mmol/L    HEMOGLOBIN 15.3 12.0 - 18.0 g/dL    HEMATOCRITRT 46 %    OXYHEMOGLOBIN 90.0 85.0 - 98.0 %    CARBOXYHEMOGLOBIN 9.1 (HH) 0.0 - 2.5 %    MET-HEMOGLOBIN 0.8 0.0 - 2.0 %    O2CT 19.5 15.7 - 24.3 %    PAO2/FIO2 RATIO 112 <=200   H & H    Result Value Ref Range    HGB 16.0 13.9 - 16.3 g/dL    HCT 46.6 (H) 36.0 - 46.0 %   H & H   Result Value Ref Range    HGB 14.4 13.9 - 16.3 g/dL    HCT 42.5 36.0 - 46.0 %   CBC   Result Value Ref Range    WBC 7.8 4.5 - 11.5 x10^3/uL    RBC 4.09 (L) 4.30 - 5.90 x10^6/uL    HGB 13.9 13.9 - 16.3 g/dL    HCT 41.1 36.0 - 46.0 %    MCV 100.6 (H) 80.0 - 100.0 fL    MCH 34.0 25.4 - 34.0 pg    MCHC 33.8 30.0 - 37.0 g/dL    RDW 15.5 (H) 11.5 - 14.0 %    PLATELETS 61 (L) 130 - 400 x10^3/uL    MPV 9.3 7.5 - 11.5 fL   BASIC METABOLIC PANEL   Result Value Ref Range    SODIUM 135 (L) 137 - 145 mmol/L    POTASSIUM 4.3 3.5 - 5.1 mmol/L    CHLORIDE 107 98 - 107 mmol/L    CO2 TOTAL 24  22 - 30 mmol/L    ANION GAP 4 (L) 5 - 19 mmol/L    CALCIUM 7.9 (L) 8.4 - 10.2 mg/dL    GLUCOSE 110 (H) 74 - 106 mg/dL    BUN 9 9 - 20 mg/dL    CREATININE 0.43 (L) 0.66 - 1.20 mg/dL    BUN/CREA RATIO 21 (H) 6 - 20    ESTIMATED GFR >60 >60 mL/min/1.42m2       Micro:   No results found for any visits on 05/01/21 (from the past 96 hour(s)).    Vitals:   Vitals:    05/02/21 0519 05/02/21 0526 05/02/21 0818 05/02/21 0845   BP:  (!) 152/78     Pulse:  78 83    Resp:       Temp:  36.2 C (97.2 F)     SpO2: 95% 93%  99%   Weight:       Height:       BMI:           Vent Settings:  Mode: APV (CMV)  Set Rate: 20 Breaths Per Minute  Set PEEP: 5 cmH2O  FiO2: 100 %  I:E Ratio: 1:2     PHYSICAL EXAM:  General: 680yomale with moderate respiratory distress now maintained on MV  Eyes: PERRLA, 330mbrisk   HENT:ENMT without erythema or injection, mucous membranes moist. ETT and OGT present with no breakdown noted  Neck: supple, symmetrical, trachea midline  Lungs: Breath sounds with bilateral rhonchi. Maintained on mechanical ventilation. Respirations non-labored  Cardiovascular: Sinus rhythm, HR 85. Pulses 2+. Capillary refill brisk   Abdomen: Soft, round, non-tender on palpation, BS normoactive   Extremities: Moves all extremities spontaneously prior to sedation. No  edema  Skin: Warm, dry, and intact. Red, moist rash noted to bilateral groins  Neurologic: Lethargic but oriented and followed commands prior to intubation. Now remains sedated. PERRL, protectives intact  Psychiatric: Cooperative prior to intubation. Now unable to assess     ASSESSMENT AND PLAN     Hector [name] a 89.o., White male  admitted to ICU for   Active Hospital Problems    Diagnosis   . Primary Problem: Multiple rib fractures   . Left pulmonary contusion   . Hemopneumothorax on left   . Spleen injury   . Closed fracture of transverse process of thoracic vertebra (CMS HCC)   . 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 Thrombocytopenia (CMS HCC)   . Alcohol abuse        NEURO:  - Hx alcoholism  - S/p trauma with fall down 11 stairs while intoxicated -> CIWA Q4H with PRN Ativan   - Continue MV, thiamine, and folic acid  - CTH: No acute intracranial findings. Left posterior occipital scalp hematoma  - CT C-spine and lumbar spine negative   - CT thoracic spine: Multiple rib fractures (medial left 4th- 6th and sixth ribs and posterior lateral left 4th-7th . Possible non-displaced left transverse process fractures of T5 and T6   - Sedated with Propofol and maintained on mechanical ventilation  - Analgesic: Fentanyl infusion, Lidoderm patch   - Daily spontaneous awakening trials  - Routine neurological exams     RESP:  - Acute respiratory failure with hypoxia -> transferred to ICU on 5/19 for intubation   -Maintained on mechanical ventilation; titration/optimization of ventilator setting for oxygenation/ventilation w/ daily assessment for SBT's as per protocol  - CTA chest: small left pneumothorax <5%. Atelectatasis  bilaterally Rt>Lt. Trace left pleural effusion. Multiple displaced and comminuted left rib fractures. SubQ air left chest wall extending into the left flank  - Possible simple pneumonia -> Unasyn and Azithro day #1  - Pulmonary hygiene  - DuoNebs every 4 hours as needed  - Serial CXRs and  ABGs    CVS:  - Hx HTN  - Hypertensive -> PRN IV hydralazine and lopressor   - Routine hemodynamic monitoring and continuous telemetry     GI:  - CTA abdomen/ pelvis: Grade 3 splenic injury. Cirrhosis with evidence of portal hypertension  - Splenic laceration -> continue serial H+H's to monitor for bleeding that could require embolization  - Nothing by mouth  - OGT to low intermittent wall suction  - Prophylaxis: IV Pepcid  - Bowel regimen: Colace and Senna    Renal:  - MIVF LR _0   - Foley catheter for strict I&O's  - Monitor and correct electrolyte imbalance as needed  - Serial BMPs    Intake/Output Summary (Last 24 hours) at 05/02/2021 0900  Last data filed at 05/01/2021 2300  Gross per 24 hour   Intake 620 ml   Output --   Net 620 ml     Net IO Since Admission: 620 mL [05/02/21 0900]    HEME:  - No active bleeding noted; H+H stable   - Thrombocytopenia, platelets 61 -> continue to trend  - Concern for splenic laceration bleeding that could require embolization-> continue serial H+H Q8H  - No anticoagulation at this time -> SCD's for prophylaxis   - Serial CBC differential    ID:  - WBC 7.8, lactate 1.8  - Possible pneumonia -> Unasyn and Azithro day #1  - Urine strep pneumo and legionella pending  - Procalcitonin pending  - UA/ UC, blood cultures, and respiratory cultures pending   - Temp (24hrs) Max:36.4 C (97.6 F)    ENDO:  - TSH and HA1C pending   - POCT glucometer Q 6 hours, add SSI as needed for optimal glycemic control     Prophylaxis: SCDs, Pepcid, Peridex, head of bed 30, pulmonary hygiene     Code Status:  Full Code     I have personally examined the patient at the bedside; reviewed the medical chart, laboratory data, radiologic imaging and discussed patient's case at bedside with multidisciplinary critical care team. Critical Care excludes any bedside procedures or teaching. Dr. Maurine Minister available for collaboration.     Critical care time excluding billable procedure is 58 minutes.     Letta Median, APRN  05/02/2021, 09:00       ICU round  1. THE PATIENT WAS SEEN AND EXAMINED WITH THE NURSE PRACTITIONER ON CRITICAL CARE ROUNDS.  2.  WE DISCUSSED THE PATIENT'S DIAGNOSTIC TESTS AND MEDICATIONS.  3. WE DISCUSSED THE PLAN.  4.  I WAS AVAILABLE TO CONSULT WITH ON ANY PROBLEMS.  5.  I was available during the intubation and initial ventilation.  Waynard Reeds, MD  Waynard Reeds, MD  05/02/2021, 19:08

## 2021-05-02 NOTE — PT Treatment (Signed)
Unable to see patient due to decline in medical status.  Hector [name], PT

## 2021-05-02 NOTE — Progress Notes (Signed)
Pt screened to see peers due to ETOH use. Peer on duty yesterday attempted to do assessment, but pt was not able to fully participate at the time. After reading through pt documentation for today, the plan is for pt to transfer to ICU and intubate pt. Peers will follow up 05/04/2021

## 2021-05-02 NOTE — OT Treatment (Signed)
Pt transferred from [facility] to ICU. OT attempted to evaluate pt in ICU, nursing reports pt is a hold for therapy due to medical status  Hector [name], OT

## 2021-05-02 NOTE — Care Plan (Signed)
PATIENT ADMITTED AS TRAUMA S/P FALL WHILE INTOXICATED-MULTIPLE RIB FRACTURES WITH CONTUSIONS AND HEMOPNEUMOTHORAX; SPLENIC LACERATION; FX THORACIS SPINE TRANSVERSE PROCESS; INTUBATED FOR RESPIRATORY DISTRESS; ANTIBIOTICS-UNASYN; PULMONARY TOLIETING; PAIN MANAGEMENT; SEDATION TO ACHIEVE RASS -1-2; DTs-PHENOBARB ATIVAN  Problem: Adult Inpatient Plan of Care  Goal: Plan of Care Review  Outcome: Ongoing (see interventions/notes)  Flowsheets (Taken 05/02/2021 2000)  Plan of Care Reviewed With: patient  Goal: Patient-Specific Goal (Individualized)  Outcome: Ongoing (see interventions/notes)  Flowsheets (Taken 05/02/2021 2000)  Individualized Care Needs: NEEDS HCS  Anxieties, Fears or Concerns: PROGNOSIS  Patient-Specific Goals (Include Timeframe): DTs MANAGEMENT  Goal: Absence of Hospital-Acquired Illness or Injury  Outcome: Ongoing (see interventions/notes)  Intervention: Identify and Manage Fall Risk  Recent Flowsheet Documentation  Taken 05/02/2021 2300 by Laurian Brim, RN  Safety Promotion/Fall Prevention: safety round/check completed  Taken 05/02/2021 2200 by Laurian Brim, RN  Safety Promotion/Fall Prevention: safety round/check completed  Taken 05/02/2021 2100 by Laurian Brim, RN  Safety Promotion/Fall Prevention: safety round/check completed  Taken 05/02/2021 2000 by Laurian Brim, RN  Safety Promotion/Fall Prevention: safety round/check completed  Intervention: Prevent Skin Injury  Recent Flowsheet Documentation  Taken 05/02/2021 2000 by Laurian Brim, RN  Body Position: turned q 2 hours  Intervention: Prevent and Manage VTE (Venous Thromboembolism) Risk  Recent Flowsheet Documentation  Taken 05/02/2021 2000 by Laurian Brim, RN  VTE Prevention/Management: sequential compression devices on  Goal: Optimal Comfort and Wellbeing  Outcome: Ongoing (see interventions/notes)  Intervention: Provide Person-Centered Care  Recent Flowsheet Documentation  Taken 05/02/2021 2000 by Laurian Brim, RN  Trust  Relationship/Rapport:   care explained   emotional support provided   reassurance provided  Goal: Rounds/Family Conference  Outcome: Ongoing (see interventions/notes)     Problem: Fall Injury Risk  Goal: Absence of Fall and Fall-Related Injury  Outcome: Ongoing (see interventions/notes)  Intervention: Promote Injury-Free Environment  Recent Flowsheet Documentation  Taken 05/02/2021 2300 by Laurian Brim, RN  Safety Promotion/Fall Prevention: safety round/check completed  Taken 05/02/2021 2200 by Laurian Brim, RN  Safety Promotion/Fall Prevention: safety round/check completed  Taken 05/02/2021 2100 by Laurian Brim, RN  Safety Promotion/Fall Prevention: safety round/check completed  Taken 05/02/2021 2000 by Laurian Brim, RN  Safety Promotion/Fall Prevention: safety round/check completed     Problem: Pain Acute  Goal: Acceptable Pain Control and Functional Ability  Outcome: Ongoing (see interventions/notes)     Problem: Communication Impairment (Mechanical Ventilation, Invasive)  Goal: Effective Communication  Outcome: Ongoing (see interventions/notes)     Problem: Device-Related Complication Risk (Mechanical Ventilation, Invasive)  Goal: Optimal Device Function  Outcome: Ongoing (see interventions/notes)     Problem: Inability to Wean (Mechanical Ventilation, Invasive)  Goal: Mechanical Ventilation Liberation  Outcome: Ongoing (see interventions/notes)     Problem: Nutrition Impairment (Mechanical Ventilation, Invasive)  Goal: Optimal Nutrition Delivery  Outcome: Ongoing (see interventions/notes)     Problem: Skin and Tissue Injury (Mechanical Ventilation, Invasive)  Goal: Absence of Device-Related Skin and Tissue Injury  Outcome: Ongoing (see interventions/notes)     Problem: Ventilator-Induced Lung Injury (Mechanical Ventilation, Invasive)  Goal: Absence of Ventilator-Induced Lung Injury  Outcome: Ongoing (see interventions/notes)  Intervention: Prevent Ventilator-Associated Pneumonia  Recent Flowsheet  Documentation  Taken 05/02/2021 2130 by Laurian Brim, RN  Oral Care: oral care with Chlorhexidine provided  Taken 05/02/2021 2000 by Laurian Brim, RN  VAP Prevention Bundle:   HOB elevation maintained   oral care with chlorhexidine   VTE prophylaxis provided   vent circuit breaks minimized   stress ulcer prophylaxis provided  Oral Care:  oral care with Chlorhexidine provided  Head of Bed (HOB) Positioning: HOB at 30-45 degrees     Problem: Non-violent/Non-Self Destructive Restraints  Goal: Alternative methods tried prior to restraints  Outcome: Ongoing (see interventions/notes)  Goal: Patient free from injury and discomfort  Outcome: Ongoing (see interventions/notes)  Goal: Autonomy maintained at the highest possible level  Outcome: Ongoing (see interventions/notes)  Goal: Need for restraints reassessed per policy  Outcome: Ongoing (see interventions/notes)  Goal: Patient education provided  Outcome: Ongoing (see interventions/notes)  Goal: Problem Interventions  Outcome: Ongoing (see interventions/notes)     Problem: Bleeding (Orthopaedic Fracture)  Goal: Absence of Bleeding  Outcome: Ongoing (see interventions/notes)     Problem: Embolism (Orthopaedic Fracture)  Goal: Absence of Embolism Signs and Symptoms  Outcome: Ongoing (see interventions/notes)  Intervention: Prevent or Manage Embolism Risk  Recent Flowsheet Documentation  Taken 05/02/2021 2000 by Laurian Brim, RN  VTE Prevention/Management: sequential compression devices on     Problem: Fracture Stabilization and Management (Orthopaedic Fracture)  Goal: Fracture Stability  Outcome: Ongoing (see interventions/notes)     Problem: Functional Ability Impaired (Orthopaedic Fracture)  Goal: Optimal Functional Ability  Outcome: Ongoing (see interventions/notes)  Intervention: Optimize Functional Ability  Recent Flowsheet Documentation  Taken 05/02/2021 2000 by Laurian Brim, RN  Activity Management: bedrest  Positioning/Transfer Devices: pillows  Range of  Motion: ROM (range of motion) performed     Problem: Infection (Orthopaedic Fracture)  Goal: Absence of Infection Signs and Symptoms  Outcome: Ongoing (see interventions/notes)  Intervention: Prevent or Manage Infection  Recent Flowsheet Documentation  Taken 05/02/2021 2000 by Laurian Brim, RN  Fever Reduction/Comfort Measures: room temperature adjusted     Problem: Neurovascular Compromise (Orthopaedic Fracture)  Goal: Effective Tissue Perfusion  Outcome: Ongoing (see interventions/notes)     Problem: Pain (Orthopaedic Fracture)  Goal: Acceptable Pain Control  Outcome: Ongoing (see interventions/notes)     Problem: Respiratory Compromise (Orthopaedic Fracture)  Goal: Effective Oxygenation and Ventilation  Outcome: Ongoing (see interventions/notes)  Intervention: Promote Airway Secretion Clearance  Recent Flowsheet Documentation  Taken 05/02/2021 2000 by Laurian Brim, RN  Cough And Deep Breathing: unable to perform     Problem: ARDS (Acute Respiratory Distress Syndrome)  Goal: Effective Oxygenation  Outcome: Ongoing (see interventions/notes)     Problem: Adjustment to Injury (Multiple Trauma)  Goal: Optimal Coping with Effects of Injury  Outcome: Ongoing (see interventions/notes)     Problem: Bleeding (Multiple Trauma)  Goal: Absence of Bleeding  Outcome: Ongoing (see interventions/notes)     Problem: Cerebral Tissue Perfusion Risk (Multiple Trauma)  Goal: Effective Cerebral Tissue Perfusion  Outcome: Ongoing (see interventions/notes)     Problem: Fluid Imbalance (Multiple Trauma)  Goal: Fluid Balance  Outcome: Ongoing (see interventions/notes)     Problem: Functional Ability Impaired (Multiple Trauma)  Goal: Optimal Functional Ability  Outcome: Ongoing (see interventions/notes)  Intervention: Optimize Functional Ability  Recent Flowsheet Documentation  Taken 05/02/2021 2000 by Laurian Brim, RN  Activity Management: bedrest  Positioning/Transfer Devices: pillows  Range of Motion: ROM (range of motion)  performed     Problem: Infection (Multiple Trauma)  Goal: Absence of Infection Signs and Symptoms  Outcome: Ongoing (see interventions/notes)  Intervention: Prevent or Manage Infection  Recent Flowsheet Documentation  Taken 05/02/2021 2000 by Laurian Brim, RN  Fever Reduction/Comfort Measures: room temperature adjusted     Problem: Neurovascular Compromise (Multiple Trauma)  Goal: Effective Peripheral Tissue Perfusion  Outcome: Ongoing (see interventions/notes)     Problem: Pain (Multiple Trauma)  Goal: Acceptable Pain Control  Outcome:  Ongoing (see interventions/notes)     Problem: Respiratory Compromise (Multiple Trauma)  Goal: Effective Oxygenation and Ventilation  Outcome: Ongoing (see interventions/notes)  Intervention: Promote Airway Secretion Clearance  Recent Flowsheet Documentation  Taken 05/02/2021 2000 by Laurian Brim, RN  Cough And Deep Breathing: unable to perform  Activity Management: bedrest  Intervention: Optimize Oxygenation and Ventilation  Recent Flowsheet Documentation  Taken 05/02/2021 2000 by Laurian Brim, RN  Head of Bed [facility]) Positioning: HOB at 30-45 degrees     Problem: Fluid Imbalance (Pneumonia)  Goal: Fluid Balance  Outcome: Ongoing (see interventions/notes)     Problem: Infection (Pneumonia)  Goal: Resolution of Infection Signs and Symptoms  Outcome: Ongoing (see interventions/notes)  Intervention: Prevent Infection Progression  Recent Flowsheet Documentation  Taken 05/02/2021 2000 by Laurian Brim, RN  Fever Reduction/Comfort Measures: room temperature adjusted     Problem: Respiratory Compromise (Pneumonia)  Goal: Effective Oxygenation and Ventilation  Outcome: Ongoing (see interventions/notes)  Intervention: Promote Airway Secretion Clearance  Recent Flowsheet Documentation  Taken 05/02/2021 2000 by Laurian Brim, RN  Cough And Deep Breathing: unable to perform  Intervention: Optimize Oxygenation and Ventilation  Recent Flowsheet Documentation  Taken 05/02/2021 2000 by Laurian Brim, RN  Head of Bed [facility]) Positioning: HOB at 30-45 degrees     Problem: Skin Injury Risk Increased  Goal: Skin Health and Integrity  Outcome: Ongoing (see interventions/notes)  Intervention: Optimize Skin Protection  Recent Flowsheet Documentation  Taken 05/02/2021 2000 by Laurian Brim, RN  Pressure Reduction Techniques: (L,M,H,VH)Frequent Turning  Pressure Reduction Devices: heels elevated off bed  Skin Protection:   adhesive use limited   transparent dressing maintained   tubing/devices free from skin contact  Head of Bed (HOB) Positioning: HOB at 30-45 degrees     Problem: Alcohol Withdrawal  Goal: Alcohol Withdrawal Symptom Control  Outcome: Ongoing (see interventions/notes)     Problem: Acute Neurologic Deterioration (Alcohol Withdrawal)  Goal: Optimal Neurologic Function  Outcome: Ongoing (see interventions/notes)     Problem: Substance Misuse (Alcohol Withdrawal)  Goal: Readiness for Change Identified  Outcome: Ongoing (see interventions/notes)

## 2021-05-02 NOTE — Progress Notes (Signed)
Name: Hector [name] MRN:  [id number]   Date of admission: 05/01/2021 Age: 60 y.o.       Name - Hector [name]    Date of service - 05/02/2021      Interval history  When I went to assess the patient this morning he looked extremely unwell.  The patient has increased work of breathing using all accessory muscles.  There is also a gurgling with his respirations and I am concerned he is not clearing his secretions.  The patient is extremely lethargic however he did answer my questions appropriately when I woke him up.  Hypertension is better controlled.    Physical exam  Filed Vitals:    05/02/21 0119 05/02/21 0123 05/02/21 0519 05/02/21 0526   BP: (!) 147/83   (!) 152/78   Pulse: 96   78   Resp: 20 18     Temp: 36.2 C (97.2 F)   36.2 C (97.2 F)   SpO2: 93%  95% 93%          Intake/Output Summary (Last 24 hours) at 05/02/2021 6387  Last data filed at 05/01/2021 2300  Gross per 24 hour   Intake 620 ml   Output --   Net 620 ml        General:  Very lethargic with increased work of breathing, patient was answer my questions appropriately however would fall back asleep  Abdomen: soft, nontender, nondistended; exam  Chest wall - pain on the left side and ecchymosis on both shoulders  Lungs:  Patient has increased work of breathing using all accessory muscles, he is satting in the low 90s with a non-rebreather  CV:  Hemodynamically stable; hypertension better controlled  Extremities: no edema      Labs/Imaging -     XR AP MOBILE CHEST performed on 05/02/2021 2:00 AM    CLINICAL HISTORY: shortness of breath.  short of breath    TECHNIQUE: Frontal view of the chest.    COMPARISON:  Yesterday    FINDINGS:    The heart size is normal.   Left basilar airspace opacities have slightly decreased mild right basilar opacities are unchanged. There is no discernible pneumothorax.  Left rib fractures are unchanged. Gas is mild.      IMPRESSION:  1.Decreased left basilar atelectasis or pneumonia. Right basilar atelectasis  or pneumonia is unchanged.  2.Left rib fractures without pneumothorax.      ASSESSMENT & RECOMMENDATIONS:   59 y.o.male status post fall downstairs sustaining multiple left-sided rib fractures with subcutaneous emphysema, transverse process fracture of thoracic spine, and grade 3 splenic laceration  - I am extremely concerned about the patient's work of breathing so he will be transferred to the ICU and intubated...  We have ordered a stat ABG  - I notified the ICU attending as well as the patient's wife  - chest x-ray this morning showed no obvious pneumothorax however once he is on positive-pressure we have to be aware of a possible pneumothorax later  - patient is a daily alcoholic with evidence of cirrhosis with portal hypertension and thrombocytopenia...  If there is concern about bleeding from his splenic laceration then he will need to be embolized by Interventional Radiology or vascular surgery...  Patient is an extremely poor surgical candidate...  We will continue with Q 8 H&Hs  - patient is at very high risk for alcohol withdrawal so he has been receiving Ativan...  We will continue to assess this  - the patient's hypertension  is better controlled, he did have systolics into 409 in the emergency room however it is much improved this morning    Clinton Quant, MD      Note: A portion of this documentation was generated using MMODAL (voice recognition software) and may contain syntax/voice recognition errors.

## 2021-05-03 ENCOUNTER — Ambulatory Visit (HOSPITAL_COMMUNITY): Payer: MEDICAID

## 2021-05-03 ENCOUNTER — Other Ambulatory Visit: Payer: Self-pay

## 2021-05-03 ENCOUNTER — Inpatient Hospital Stay (HOSPITAL_COMMUNITY): Payer: MEDICAID

## 2021-05-03 DIAGNOSIS — Z9911 Dependence on respirator [ventilator] status: Secondary | ICD-10-CM | POA: Diagnosis present

## 2021-05-03 LAB — BASIC METABOLIC PANEL
ANION GAP: 3 mmol/L — ABNORMAL LOW (ref 5–19)
BUN/CREA RATIO: 21 — ABNORMAL HIGH (ref 6–20)
BUN: 13 mg/dL (ref 9–20)
CALCIUM: 7.8 mg/dL — ABNORMAL LOW (ref 8.4–10.2)
CHLORIDE: 107 mmol/L (ref 98–107)
CO2 TOTAL: 26 mmol/L (ref 22–30)
CREATININE: 0.61 mg/dL — ABNORMAL LOW (ref 0.66–1.20)
ESTIMATED GFR: >60 mL/min/{1.73_m2} (ref >60)
GLUCOSE: 114 mg/dL — ABNORMAL HIGH (ref 74–106)
POTASSIUM: 3.6 mmol/L (ref 3.5–5.1)
SODIUM: 136 mmol/L — ABNORMAL LOW (ref 137–145)

## 2021-05-03 LAB — ARTERIAL BLOOD GAS, CO-OX, LYTES, LACTATE REFLEX
%FIO2 (ARTERIAL): 60 %
(T) PCO2: 42 mm/Hg (ref 35.0–45.0)
(T) PO2: 73 mm/Hg (ref 72.0–100.0)
BASE EXCESS (ARTERIAL): 0.3 mmol/L (ref 0.0–1.0)
BICARBONATE (ARTERIAL): 25.1 mmol/L (ref 18.0–26.0)
CARBOXYHEMOGLOBIN: 2.2 % (ref 0.0–2.5)
CHLORIDE: 105 mmol/L (ref 101–111)
GLUCOSE: 108 mg/dL — ABNORMAL HIGH (ref 60–105)
HEMATOCRITRT: 42 %
HEMOGLOBIN: 13.9 g/dL (ref 12.0–18.0)
IONIZED CALCIUM: 1.13 mmol/L (ref 1.10–1.35)
LACTATE: 1.2 mmol/L (ref 0.0–1.3)
MET-HEMOGLOBIN: 0.8 % (ref 0.0–2.0)
O2CT: 18.3 % (ref 15.7–24.3)
OXYHEMOGLOBIN: 93.6 % (ref 85.0–98.0)
PAO2/FIO2 RATIO: 122 (ref <=200)
PCO2 (ARTERIAL): 42 mm/Hg (ref 35–45)
PH (ARTERIAL): 7.39 (ref 7.35–7.45)
PH (T): 7.39 (ref 7.35–7.45)
PO2 (ARTERIAL): 73 mm/Hg (ref 72–100)
SODIUM: 134 mmol/L — ABNORMAL LOW (ref 137–145)
TEMPERATURE, COMP: 37 C
WHOLE BLOOD POTASSIUM: 3.8 mmol/L (ref 3.5–4.6)

## 2021-05-03 LAB — CBC
HCT: 38.3 % (ref 36.0–46.0)
HGB: 12.7 g/dL — ABNORMAL LOW (ref 13.9–16.3)
MCH: 33.6 pg (ref 25.4–34.0)
MCHC: 33.1 g/dL (ref 30.0–37.0)
MCV: 101.5 fL — ABNORMAL HIGH (ref 80.0–100.0)
MPV: 9.5 fL (ref 7.5–11.5)
PLATELETS: 56 10*3/uL — ABNORMAL LOW (ref 130–400)
RBC: 3.78 10*6/uL — ABNORMAL LOW (ref 4.30–5.90)
RDW: 16.2 % — ABNORMAL HIGH (ref 11.5–14.0)
WBC: 6.5 10*3/uL (ref 4.5–11.5)

## 2021-05-03 LAB — PHOSPHORUS: PHOSPHORUS: 2.8 mg/dL (ref 2.5–4.5)

## 2021-05-03 LAB — H & H
HCT: 34.8 % — ABNORMAL LOW (ref 36.0–46.0)
HCT: 37.2 % (ref 36.0–46.0)
HGB: 12 g/dL — ABNORMAL LOW (ref 13.9–16.3)
HGB: 12.2 g/dL — ABNORMAL LOW (ref 13.9–16.3)

## 2021-05-03 LAB — MAGNESIUM: MAGNESIUM: 1.9 mg/dL (ref 1.6–2.3)

## 2021-05-03 LAB — ADULT ROUTINE BLOOD CULTURE, SET OF 2 BOTTLES (BACTERIA AND YEAST)
BLOOD CULTURE, ROUTINE: NO GROWTH
BLOOD CULTURE, ROUTINE: NO GROWTH

## 2021-05-03 MED ORDER — IOPAMIDOL 300 MG IODINE/ML (61 %) INTRAVENOUS SOLUTION
75.0000 mL | INTRAVENOUS | Status: AC
Start: 2021-05-03 — End: 2021-05-03
  Administered 2021-05-03 (×2): 75 mL via INTRAVENOUS

## 2021-05-03 MED ORDER — PANTOPRAZOLE 40 MG INTRAVENOUS SOLUTION
40.0000 mg | Freq: Two times a day (BID) | INTRAVENOUS | Status: DC
Start: 2021-05-03 — End: 2021-05-30
  Administered 2021-05-03 – 2021-05-29 (×53): 40 mg via INTRAVENOUS
  Filled 2021-05-03 (×54): qty 10

## 2021-05-03 MED ORDER — INSULIN REGULAR HUMAN 100 UNIT/ML INJECTION - CHARGE BY DOSE
10.0000 [IU] | Freq: Once | INTRAMUSCULAR | Status: DC
Start: 2021-05-03 — End: 2021-05-03

## 2021-05-03 MED ORDER — SODIUM CHLORIDE 0.9 % INTRAVENOUS SOLUTION
12.0000 [IU]/h | INTRAVENOUS | Status: DC
Start: 2021-05-03 — End: 2021-05-03

## 2021-05-03 MED ORDER — POTASSIUM CHLORIDE 20 MEQ/100ML IN STERILE WATER INTRAVENOUS PIGGYBACK
20.0000 meq | INJECTION | Freq: Once | INTRAVENOUS | Status: AC
Start: 2021-05-03 — End: 2021-05-03
  Administered 2021-05-03: 0 meq via INTRAVENOUS
  Administered 2021-05-03: 20 meq via INTRAVENOUS
  Filled 2021-05-03: qty 100

## 2021-05-03 MED ORDER — SODIUM CHLORIDE 0.9 % (FLUSH) INJECTION SYRINGE
10.0000 mL | INJECTION | Freq: Every day | INTRAMUSCULAR | Status: DC
Start: 2021-05-03 — End: 2021-05-30
  Administered 2021-05-03: 0 mL via INTRAVENOUS
  Administered 2021-05-04 – 2021-05-18 (×15): 10 mL via INTRAVENOUS
  Administered 2021-05-19 – 2021-05-20 (×2): 0 mL via INTRAVENOUS
  Administered 2021-05-21: 10 mL via INTRAVENOUS
  Administered 2021-05-22: 0 mL via INTRAVENOUS
  Administered 2021-05-23: 10 mL via INTRAVENOUS
  Administered 2021-05-24: 0 mL via INTRAVENOUS
  Administered 2021-05-25 – 2021-05-29 (×5): 10 mL via INTRAVENOUS

## 2021-05-03 MED ORDER — PHENOBARBITAL SODIUM 65 MG/ML INJECTION SOLUTION
30.0000 mg | Freq: Four times a day (QID) | INTRAMUSCULAR | Status: AC | PRN
Start: 2021-05-03 — End: 2021-05-04

## 2021-05-03 NOTE — Ancillary Notes (Addendum)
Medical Nutrition Therapy Assessment  Consult for Determine Appropriate TF      SUBJECTIVE :60y/o M pt. Dx: Spleen Injury. 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 Pt was transferred ot ICU on 5/19 due ot increased resp distress per medical record. He is intubated and sedated. As of this AM, Propofol running at 16ml/hr, ~132kcals.     Patient Active Problem List   Diagnosis   . Multiple rib fractures   . Left pulmonary contusion   . Hemopneumothorax on left   . Spleen injury   . Closed fracture of transverse process of thoracic vertebra (CMS HCC)   . 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 Thrombocytopenia (CMS HCC)   . Alcohol abuse     OBJECTIVE: Height: 182.9 cm (6'), Weight: 101 kg (223 lb 12.3 oz), Body mass index is 30.35 kg/m.  IBW 178# ABW 189#    BMP (Last 48 Hours):  Recent Results in last 48 hours     05/02/21  0522 05/02/21  0922 05/03/21  0421 05/03/21  0449   SODIUM 135* 134* 136* 134*   POTASSIUM 4.3  --  3.6  --    CHLORIDE 107 104 107 105   CO2 24  --  26  --    BUN 9  --  13  --    CREATININE 0.43*  --  0.61*  --    CALCIUM 7.9*  --  7.8*  --    GLUCOSENF 110*  --  114*  --      Current Diet Order/Nutrition Support:  DIET NPO - NOW  TUBE FEED - CONTINUOUS DRIP CONTINUOUS NO MEALS, TF ONLY; Jevity 1.2; OG; Goal Rate (ml/hr): 0; Initial Rate (ml/hr): 20     Estimated Needs:  1892-2150kcals (22-25kcal/kg) ABW  81-129.6gms protein (1.0-1.6gm/kg pro) IBW  Fluids per Physician.       Plan/Interventions :  Should tube feedings be initiated, recommend Jevity 1.5 formula. Start at 54ml and advance to goal rate of 66ml/hr as tolerated/per physician order. Flush per Physician.     Goal rate provides 1980kcals (2112kcals with propofol @ 57ml/hr), 84gms protein, and 1094ml free water.     Rulon Abide, RDLD  05/03/2021, 11:23    Addendum: TF recommendation update :    Per pump review, propofol running at 22.81ml/hr, ~588kcals.  Should TF be initiated, Suggest Vital High Protein formula.  Start at 38ml/hr and advance to goal of 31mL/hr as tolerated/per physician order. Goal provides 1320kcals (1908kcals with propofol), ~116gms protein, and 1140ml free water. Flush per Physician/NP order.     Rulon Abide, RDLD  05/03/2021, 12:46

## 2021-05-03 NOTE — Care Plan (Signed)
Hector [name] ADMITTED AFTER FALL WHILE INTOXICATED; INITIALLY DELAYED CARE; MULTIPLE RIB FRACTURES WITH SMALL PTX(RESOLVED)WITH PULMONARY CONTUSION; GRADE 3 SPLENIC LACERATION; TRANSVERSE PROCESS FRACTURE IN THORACIC SPINE; HX ALCOHOL ABUSE/DTs; INTUBATED FOR AMS/HYPOXIA; ANTIBIOTICS FOR PNEUMONIA-UNASYN ZITHROMAX; DTs MANAGEMENT-PHENOBARB/ATIVAN; SEDATION/ANALGESIC-FENTANYL/PROPOFOL DRIPS  TRICKLE TF STARTED; TREND H/H; PREVENT VAP CAUTI CLABSI; PROTONIX FOR GI PROPHYLAXIS; PAS HOSE FOR VTE PROPHYLAXIS  60%FiO2/5PEEP-AGGRESSIVE PULMONARY TOLIETING  Problem: Adult Inpatient Plan of Care  Goal: Plan of Care Review  Recent Flowsheet Documentation  Taken 05/03/2021 2000 by Laurian Brim, RN  Plan of Care Reviewed With: Hector [name]  05/03/2021 1945 by Laurian Brim, RN  Outcome: Ongoing (see interventions/notes)  Goal: Hector [name]-Specific Goal (Individualized)  Recent Flowsheet Documentation  Taken 05/03/2021 2000 by Laurian Brim, RN  Individualized Care Needs: NEEDS HCS  Anxieties, Fears or Concerns: PROGNOSIS  Hector [name]-Specific Goals (Include Timeframe): RASS -1-2  05/03/2021 1945 by Laurian Brim, RN  Outcome: Ongoing (see interventions/notes)  Goal: Absence of Hospital-Acquired Illness or Injury  Outcome: Ongoing (see interventions/notes)  Intervention: Identify and Manage Fall Risk  Recent Flowsheet Documentation  Taken 05/03/2021 2000 by Laurian Brim, RN  Safety Promotion/Fall Prevention: safety round/check completed  Taken 05/03/2021 0700 by Laurian Brim, RN  Safety Promotion/Fall Prevention: safety round/check completed  Taken 05/03/2021 0600 by Laurian Brim, RN  Safety Promotion/Fall Prevention: safety round/check completed  Intervention: Prevent Skin Injury  Recent Flowsheet Documentation  Taken 05/03/2021 2000 by Laurian Brim, RN  Body Position: turned q 2 hours  Intervention: Prevent and Manage VTE (Venous Thromboembolism) Risk  Recent Flowsheet Documentation  Taken 05/03/2021 2000 by Laurian Brim, RN  VTE  Prevention/Management: sequential compression devices on  Goal: Optimal Comfort and Wellbeing  Outcome: Ongoing (see interventions/notes)  Intervention: Provide Person-Centered Care  Recent Flowsheet Documentation  Taken 05/03/2021 2000 by Laurian Brim, RN  Trust Relationship/Rapport:   care explained   emotional support provided   reassurance provided  Goal: Rounds/Family Conference  Outcome: Ongoing (see interventions/notes)     Problem: Fall Injury Risk  Goal: Absence of Fall and Fall-Related Injury  Outcome: Ongoing (see interventions/notes)  Intervention: Promote [place] Documentation  Taken 05/03/2021 2000 by Laurian Brim, RN  Safety Promotion/Fall Prevention: safety round/check completed  Taken 05/03/2021 0700 by Laurian Brim, RN  Safety Promotion/Fall Prevention: safety round/check completed  Taken 05/03/2021 0600 by Laurian Brim, RN  Safety Promotion/Fall Prevention: safety round/check completed     Problem: Pain Acute  Goal: Acceptable Pain Control and Functional Ability  Outcome: Ongoing (see interventions/notes)  Intervention: Optimize Psychosocial Wellbeing  Recent Flowsheet Documentation  Taken 05/03/2021 2000 by Laurian Brim, RN  Supportive Measures: relaxation techniques promoted     Problem: Communication Impairment (Mechanical Ventilation, Invasive)  Goal: Effective Communication  Outcome: Ongoing (see interventions/notes)     Problem: Device-Related Complication Risk (Mechanical Ventilation, Invasive)  Goal: Optimal Device Function  Outcome: Ongoing (see interventions/notes)     Problem: Inability to Wean (Mechanical Ventilation, Invasive)  Goal: Mechanical Ventilation Liberation  Outcome: Ongoing (see interventions/notes)     Problem: Nutrition Impairment (Mechanical Ventilation, Invasive)  Goal: Optimal Nutrition Delivery  Outcome: Ongoing (see interventions/notes)     Problem: Skin and Tissue Injury (Mechanical Ventilation, Invasive)  Goal: Absence of  Device-Related Skin and Tissue Injury  Outcome: Ongoing (see interventions/notes)     Problem: Ventilator-Induced Lung Injury (Mechanical Ventilation, Invasive)  Goal: Absence of Ventilator-Induced Lung Injury  Outcome: Ongoing (see interventions/notes)  Intervention: Prevent Ventilator-Associated Pneumonia  Recent Flowsheet Documentation  Taken 05/03/2021 2000 by Laurian Brim, RN  VAP Prevention  Bundle:   HOB elevation maintained   oral care with chlorhexidine   VTE prophylaxis provided   vent circuit breaks minimized   stress ulcer prophylaxis provided  Oral Care: oral care with Chlorhexidine provided  Head of Bed (HOB) Positioning: HOB at 30-45 degrees     Problem: Non-violent/Non-Self Destructive Restraints  Goal: Alternative methods tried prior to restraints  Outcome: Ongoing (see interventions/notes)  Goal: Hector [name] free from injury and discomfort  Outcome: Ongoing (see interventions/notes)  Goal: Autonomy maintained at the highest possible level  Outcome: Ongoing (see interventions/notes)  Goal: Need for restraints reassessed per policy  Outcome: Ongoing (see interventions/notes)  Goal: Hector [name] education provided  Outcome: Ongoing (see interventions/notes)  Goal: Problem Interventions  Outcome: Ongoing (see interventions/notes)     Problem: Bleeding (Orthopaedic Fracture)  Goal: Absence of Bleeding  Outcome: Ongoing (see interventions/notes)     Problem: Embolism (Orthopaedic Fracture)  Goal: Absence of Embolism Signs and Symptoms  Outcome: Ongoing (see interventions/notes)  Intervention: Prevent or Manage Embolism Risk  Recent Flowsheet Documentation  Taken 05/03/2021 2000 by Laurian Brim, RN  VTE Prevention/Management: sequential compression devices on     Problem: Fracture Stabilization and Management (Orthopaedic Fracture)  Goal: Fracture Stability  Outcome: Ongoing (see interventions/notes)     Problem: Functional Ability Impaired (Orthopaedic Fracture)  Goal: Optimal Functional Ability  Outcome:  Ongoing (see interventions/notes)  Intervention: Optimize Functional Ability  Recent Flowsheet Documentation  Taken 05/03/2021 2000 by Laurian Brim, RN  Activity Management: bedrest  Positioning/Transfer Devices: pillows  Range of Motion: ROM (range of motion) performed     Problem: Infection (Orthopaedic Fracture)  Goal: Absence of Infection Signs and Symptoms  Outcome: Ongoing (see interventions/notes)  Intervention: Prevent or Manage Infection  Recent Flowsheet Documentation  Taken 05/03/2021 2000 by Laurian Brim, RN  Fever Reduction/Comfort Measures: room temperature adjusted     Problem: Neurovascular Compromise (Orthopaedic Fracture)  Goal: Effective Tissue Perfusion  Outcome: Ongoing (see interventions/notes)     Problem: Pain (Orthopaedic Fracture)  Goal: Acceptable Pain Control  Outcome: Ongoing (see interventions/notes)     Problem: Respiratory Compromise (Orthopaedic Fracture)  Goal: Effective Oxygenation and Ventilation  Outcome: Ongoing (see interventions/notes)  Intervention: Promote Airway Secretion Clearance  Recent Flowsheet Documentation  Taken 05/03/2021 2000 by Laurian Brim, RN  Cough And Deep Breathing: unable to perform     Problem: ARDS (Acute Respiratory Distress Syndrome)  Goal: Effective Oxygenation  Outcome: Ongoing (see interventions/notes)     Problem: Adjustment to Injury (Multiple Trauma)  Goal: Optimal Coping with Effects of Injury  Outcome: Ongoing (see interventions/notes)  Intervention: Support Adjustment to Injury  Recent Flowsheet Documentation  Taken 05/03/2021 2000 by Laurian Brim, RN  Supportive Measures: relaxation techniques promoted     Problem: Bleeding (Multiple Trauma)  Goal: Absence of Bleeding  Outcome: Ongoing (see interventions/notes)     Problem: Cerebral Tissue Perfusion Risk (Multiple Trauma)  Goal: Effective Cerebral Tissue Perfusion  Outcome: Ongoing (see interventions/notes)     Problem: Fluid Imbalance (Multiple Trauma)  Goal: Fluid Balance  Outcome:  Ongoing (see interventions/notes)     Problem: Functional Ability Impaired (Multiple Trauma)  Goal: Optimal Functional Ability  Outcome: Ongoing (see interventions/notes)  Intervention: Optimize Functional Ability  Recent Flowsheet Documentation  Taken 05/03/2021 2000 by Laurian Brim, RN  Activity Management: bedrest  Positioning/Transfer Devices: pillows  Range of Motion: ROM (range of motion) performed     Problem: Infection (Multiple Trauma)  Goal: Absence of Infection Signs and Symptoms  Outcome: Ongoing (see interventions/notes)  Intervention: Prevent or  Manage Infection  Recent Flowsheet Documentation  Taken 05/03/2021 2000 by Laurian Brim, RN  Fever Reduction/Comfort Measures: room temperature adjusted     Problem: Neurovascular Compromise (Multiple Trauma)  Goal: Effective Peripheral Tissue Perfusion  Outcome: Ongoing (see interventions/notes)     Problem: Pain (Multiple Trauma)  Goal: Acceptable Pain Control  Outcome: Ongoing (see interventions/notes)     Problem: Respiratory Compromise (Multiple Trauma)  Goal: Effective Oxygenation and Ventilation  Outcome: Ongoing (see interventions/notes)  Intervention: Promote Airway Secretion Clearance  Recent Flowsheet Documentation  Taken 05/03/2021 2000 by Laurian Brim, RN  Cough And Deep Breathing: unable to perform  Activity Management: bedrest  Intervention: Optimize Oxygenation and Ventilation  Recent Flowsheet Documentation  Taken 05/03/2021 2000 by Laurian Brim, RN  Head of Bed [facility]) Positioning: HOB at 30-45 degrees     Problem: Fluid Imbalance (Pneumonia)  Goal: Fluid Balance  Outcome: Ongoing (see interventions/notes)     Problem: Infection (Pneumonia)  Goal: Resolution of Infection Signs and Symptoms  Outcome: Ongoing (see interventions/notes)  Intervention: Prevent Infection Progression  Recent Flowsheet Documentation  Taken 05/03/2021 2000 by Laurian Brim, RN  Fever Reduction/Comfort Measures: room temperature adjusted     Problem: Respiratory  Compromise (Pneumonia)  Goal: Effective Oxygenation and Ventilation  Outcome: Ongoing (see interventions/notes)  Intervention: Promote Airway Secretion Clearance  Recent Flowsheet Documentation  Taken 05/03/2021 2000 by Laurian Brim, RN  Cough And Deep Breathing: unable to perform  Intervention: Optimize Oxygenation and Ventilation  Recent Flowsheet Documentation  Taken 05/03/2021 2000 by Laurian Brim, RN  Head of Bed [facility]) Positioning: HOB at 30-45 degrees     Problem: Skin Injury Risk Increased  Goal: Skin Health and Integrity  Outcome: Ongoing (see interventions/notes)  Intervention: Optimize Skin Protection  Recent Flowsheet Documentation  Taken 05/03/2021 2000 by Laurian Brim, RN  Pressure Reduction Techniques: (L,M,H,VH)Frequent Turning  Pressure Reduction Devices: heels elevated off bed  Skin Protection:   adhesive use limited   transparent dressing maintained   tubing/devices free from skin contact  Head of Bed (HOB) Positioning: HOB at 30-45 degrees     Problem: Alcohol Withdrawal  Goal: Alcohol Withdrawal Symptom Control  Outcome: Ongoing (see interventions/notes)     Problem: Acute Neurologic Deterioration (Alcohol Withdrawal)  Goal: Optimal Neurologic Function  Outcome: Ongoing (see interventions/notes)     Problem: Substance Misuse (Alcohol Withdrawal)  Goal: Readiness for Change Identified  Outcome: Ongoing (see interventions/notes)  Intervention: Partner to Facilitate Behavior Change  Recent Flowsheet Documentation  Taken 05/03/2021 2000 by Laurian Brim, RN  Supportive Measures: relaxation techniques promoted

## 2021-05-03 NOTE — Progress Notes (Addendum)
Name: Hector [name] MRN:  [id number]   Date of admission: 05/01/2021 Age: 60 y.o.       Name - Hector [name]    Date of service - 05/03/2021      Interval history  Patient was intubated yesterday and transferred to the ICU for increasing respiratory distress.  Patient is currently sedated on the ventilator.  He remains hemodynamically stable.  Hemoglobin has trended down to 12 from 16.  We have been monitoring his left pneumothorax however remains unchanged on serial chest x-rays.    Physical exam  Filed Vitals:    05/03/21 0600 05/03/21 0630 05/03/21 0700 05/03/21 0716   BP: 99/62 117/64     Pulse: 97 95     Resp: (!) 22 (!) 23     Temp:   37.1 C (98.8 F)    SpO2: 93% 93%  92%          Intake/Output Summary (Last 24 hours) at 05/03/2021 0857  Last data filed at 05/03/2021 0800  Gross per 24 hour   Intake 3886.45 ml   Output 2686 ml   Net 1200.45 ml        General:  Intubated in the ICU with sedation  Abdomen: soft, nontender, nondistended, small umbilical hernia  Skin: No jaundice, lesions, or rashes.   Lungs:  On the ventilator and lungs are more clear this morning  CV:  Hemodynamically stable  Extremities: no edema      Labs/Imaging -     XR AP MOBILE CHEST performed on 05/03/2021 7:01 AM    CLINICAL HISTORY: Trauma.  resp failure   vent    TECHNIQUE: Frontal view of the chest.    COMPARISON:  Yesterday    FINDINGS:  The support lines and tubes are in stable and satisfactory position.   Cardiac and mediastinal contours are stable.   No significant change in the appearance of the lungs.         IMPRESSION:  NO SIGNIFICANT CHANGE SINCE THE PRIOR EXAM.      ASSESSMENT & RECOMMENDATIONS:   59 y.o.male status post fall downstairs sustaining multiple left-sided rib fractures with subcutaneous emphysema, transverse process fracture of thoracic spine, and grade 3 splenic laceration  -patient to remain intubated in the ICU  - we will repeat the CT of the chest abdomen and pelvis to reassess the solid organ  injury and the pneumonitis  - patient can be started on trickle tube feeds so a nutritional consult will be placed today  - antibiotics for the developing pneumonia  - continue close monitoring of his hemoglobin, we will transfuse as needed and likely give platelets if he requires transfusion  - patient is a daily alcoholic with evidence of cirrhosis with portal hypertension and thrombocytopenia...  If there is concern about bleeding from his splenic laceration then he will need to be embolized by Interventional Radiology or vascular surgery...  Patient is an extremely poor surgical candidate  - can the with daily chest x-rays to monitor the pneumothorax, we will have a low threshold to repeat a chest x-ray if there is any question about increasing FiO2 requirements    Clinton Quant, MD      Note: A portion of this documentation was generated using MMODAL (voice recognition software) and may contain syntax/voice recognition errors.

## 2021-05-03 NOTE — Result Encounter Note (Signed)
Nursing recommends no physical therapy for this patient. Patient is currently intubated and sedated. Nursing also reports possible ETOH withdrawal. Recommends PT to check status on 05/06/2021     Jackie Plum, PT, DPT

## 2021-05-03 NOTE — Progress Notes (Signed)
[facility]    ICU progress note    Patient Name: Hector [name]  Date: 05/03/2021   Department:  [place]  MRN: [id number]  DOB: 1961-02-05    Active Hospital Problems    Diagnosis   . On mechanically assisted ventilation (CMS HCC)   . Multiple rib fractures   . Left pulmonary contusion   . 1Spleen injury   . Closed fracture of transverse process of thoracic vertebra (CMS HCC)   . 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 Thrombocytopenia (CMS HCC)   . Alcohol abuse         HPI:  Hector [name] is a 60 y.o., male with past medical history of alcoholism and hypertension who presented as a level 2 trauma s/p falling down approximately 11 stairs while intoxicated. He initially refused medical treatment but presented to the ED on 5/18 after awaking later that morning with severe shortness of breath and posterior chest wall pain. He was noted to have bilateral shoulder and left posterior scalp ecchymosis, left forearm skin tear, and a torn right great toe torn toenail. CTA C/A/P revealed a small left pneumothorax <5%. Atelectatasis bilaterally Rt>Lt. Trace left pleural effusion. Multiple displaced and comminuted left rib fractures. SubQ air left chest wall extending into the left flank. Grade 3 Splenic Trauma. Cirrhosis with evidence of portal hypertension. CT thoracic spine showed multiple rib fractures (medial left 4th- 6th and sixth ribs and posterior lateral left 4th-7th . Possible non-displaced left transverse process fractures of T5 and T6. He was admitted to [facility] under the Trauma Service. Communication with Vascular Surgery, although not a formal consult, regarding concern for splenic laceration bleeding. Close monitoring of H+H's.   On 5/19, on assessment he was noted to be lethargic with increased work of breathing, accessory muscle use, and gurgling respirations. There was concern he was unable to clear his secretions. He was transferred to ICU for intubation. On arrival to ICU,  he was lethargic but oriented and answered questions appropriately. On 100% NRB with increased work of breathing and subsequently intubated for airway protection. Hypertension post intubation that improved with sedation.   Per significant other, [name], the patient drinks approximately15 beers and 2 [employer]/ daily but occasionally more. He is functioning in the morning and is able to complete tasks around the house but does not drive or leave the house. He begins drinking in the afternoon and is unable to stand/ walk by night.     5/20: Remains critically ill in ICU on mechanical ventilation. No acute events overnight. Repeat CT C/A/P showed resolution of left pneumothorax, worsening bilateral lower lobe consolidation, decreasing subQ air in chest wall surrounding left sided rib fractures, and a stable splenic laceration. Tube feeding started.    PMHx:   Past Medical History:   Diagnosis Date   . Alcohol abuse    . Cirrhosis (CMS [place])    . Delirium, withdrawal, alcoholic (CMS HCC)    . HTN (hypertension)    . Unknown cause of injury     fx nose   . Wears glasses        PSHx:  Past Surgical History:   Procedure Laterality Date   . HX TONSILLECTOMY         FHx:  Family History   Problem Relation Name Age of Onset   . Asthma Father          SHx:  Social History     Socioeconomic History   .  Marital status: Divorced   Tobacco Use   . Smoking status: Current Every Day Smoker     Packs/day: 1.50   . Smokeless tobacco: Never Used   Vaping Use   . Vaping Use: Never used   Substance and Sexual Activity   . Alcohol use: Yes     Alcohol/week: 15.0 standard drinks     Types: 15 Cans of beer per week     Comment: daily   . Drug use: Yes     Types: Marijuana     Comment: daily   . Sexual activity: Yes        ROS:  Unable to obtain ROS due to patient condition.     Imaging:   CT CHEST ABDOMEN PELVIS W IV CONTRAST   Final Result      RESPIRATORY MOTION DEGRADED EXAM.      INTERVAL RESOLUTION OF THE PREVIOUSLY SEEN SMALL  LEFT-SIDED PNEUMOTHORAX.      WORSENING BILATERAL LOWER LOBE CONSOLIDATION THAT COULD REFLECT ATELECTASIS AND/OR PNEUMONIA      REDEMONSTRATION OF MULTIPLE DISPLACED LEFT-SIDED RIB FRACTURES WITH DECREASING SUBCUTANEOUS AIR IN THE CHEST WALL.      THE KNOWN GRADE 3 SPLENIC LACERATION IS NOT AS WELL VISUALIZED ON THE CURRENT EXAM AND IS STABLE TO IMPROVED FROM THE 05/01/2021 EXAM. NO NEW ACUTE FINDINGS IN THE ABDOMEN/PELVIS.         One or more dose reduction techniques were used (e.g., Automated exposure control, adjustment of the mA and/or kV according to patient size, use of iterative reconstruction technique).         Radiologist location ID: [id number]         XR AP MOBILE CHEST   Final Result   NO SIGNIFICANT CHANGE SINCE THE PRIOR EXAM.         Radiologist location ID: [id number]         XR AP MOBILE CHEST   Final Result   No interval change from 05/02/2021 in the lung fields.         Radiologist location ID: [id number]         XR AP MOBILE CHEST   Final Result   Stable examination compared to 05/02/2021.         Radiologist location ID: [id number]         XR AP MOBILE CHEST   Final Result   1.Decreased left basilar atelectasis or pneumonia. Right basilar atelectasis or pneumonia is unchanged.   2.Left rib fractures without pneumothorax.         Radiologist location ID: [id number]         CT LUMBAR SPINE WO IV CONTRAST   Final Result   NO ACUTE LUMBAR FRACTURE.  DEGENERATIVE CHANGES.         One or more dose reduction techniques were used (e.g., Automated exposure control, adjustment of the mA and/or kV according to patient size, use of iterative reconstruction technique).         Radiologist location ID: [id number]         CT THORACIC SPINE WO IV CONTRAST   Final Result   1.Multiple medial and posterior left rib fractures as described. There are possible transverse process fractures at the T5 and T6 levels on the left as well   2.Small medial left pneumothorax. There is soft tissue air on the left.  There is a left basal infiltrate and small left effusion         One or more dose reduction techniques were  used (e.g., Automated exposure control, adjustment of the mA and/or kV according to patient size, use of iterative reconstruction technique).         Radiologist location ID: [id number]         CT CERVICAL SPINE WO IV CONTRAST   Final Result   NO ACUTE CERVICAL FRACTURE.  DEGENERATIVE CHANGES.         One or more dose reduction techniques were used (e.g., Automated exposure control, adjustment of the mA and/or kV according to patient size, use of iterative reconstruction technique).         Radiologist location ID: [id number]         TRAUMA CTA CHEST W CT ABDOMEN PELVIS W IV CONTRAST   Final Result   Addendum 1 of 1   ADDENDUM:      A Critical Document Only message has been documented for [place] ANDERSON in    the [employer] - [employer] Actionable Findings system on 05/01/2021    10:19 AM, Message ID [id number].         Radiologist location ID: [id number]         Final   Grade 3 Splenic Trauma, per the American Association for the Surgery of Trauma (AAST) injury grading system, as described above.      Cirrhosis with evidence of portal hypertension      Small left pneumothorax measuring less than 5%      Atelectatic changes at the lung bases with greater the right with a trace left effusion.      Multiple displaced and comminuted left rib fractures.   Subcutaneous air left chest wall extending into the left flank.         One or more dose reduction techniques were used (e.g., Automated exposure control, adjustment of the mA and/or kV according to patient size, use of iterative reconstruction technique).         Radiologist location ID: [id number]         CT BRAIN WO IV CONTRAST   Final Result   No evidence of acute intracranial injury or change.         One or more dose reduction techniques were used (e.g., Automated exposure control, adjustment of the mA and/or kV according to patient size, use of  iterative reconstruction technique).         Radiologist location ID: [id number]         XR AP MOBILE CHEST   Final Result   Bilateral left greater than right acute on chronic infiltrates suggested with possible left effusion. Recommend follow-up.         Radiologist location ID: [place]:  Results for orders placed or performed during the hospital encounter of 05/01/21 (from the past 24 hour(s))   H & H   Result Value Ref Range    HGB 13.0 (L) 13.9 - 16.3 g/dL    HCT 39.3 36.0 - 46.0 %   BASIC METABOLIC PANEL   Result Value Ref Range    SODIUM 136 (L) 137 - 145 mmol/L    POTASSIUM 3.6 3.5 - 5.1 mmol/L    CHLORIDE 107 98 - 107 mmol/L    CO2 TOTAL 26 22 - 30 mmol/L    ANION GAP 3 (L) 5 - 19 mmol/L    CALCIUM 7.8 (L) 8.4 - 10.2 mg/dL    GLUCOSE 114 (H) 74 - 106 mg/dL    BUN 13  9 - 20 mg/dL    CREATININE 0.61 (L) 0.66 - 1.20 mg/dL    BUN/CREA RATIO 21 (H) 6 - 20    ESTIMATED GFR >60 >60 mL/min/1.56m2   PHOSPHORUS   Result Value Ref Range    PHOSPHORUS 2.8 2.5 - 4.5 mg/dL   MAGNESIUM   Result Value Ref Range    MAGNESIUM 1.9 1.6 - 2.3 mg/dL   CBC   Result Value Ref Range    WBC 6.5 4.5 - 11.5 x10^3/uL    RBC 3.78 (L) 4.30 - 5.90 x10^6/uL    HGB 12.7 (L) 13.9 - 16.3 g/dL    HCT 38.3 36.0 - 46.0 %    MCV 101.5 (H) 80.0 - 100.0 fL    MCH 33.6 25.4 - 34.0 pg    MCHC 33.1 30.0 - 37.0 g/dL    RDW 16.2 (H) 11.5 - 14.0 %    PLATELETS 56 (L) 130 - 400 x10^3/uL    MPV 9.5 7.5 - 11.5 fL   ARTERIAL BLOOD GAS, CO-OX, LYTES, LACTATE REFLEX   Result Value Ref Range    %FIO2 (ARTERIAL) 60 %    PH (ARTERIAL) 7.39 7.35 - 7.45    PCO2 (ARTERIAL) 42 35 - 45 mm/Hg    PO2 (ARTERIAL) 73 72 - 100 mm/Hg    BASE EXCESS (ARTERIAL) 0.3 0.0 - 1.0 mmol/L    BICARBONATE (ARTERIAL) 25.1 18.0 - 26.0 mmol/L    TEMPERATURE, COMP 37.0   C    PH (T) 7.39 7.35 - 7.45    (T) PCO2 42.0 35.0 - 45.0 mm/Hg    (T) PO2 73.0 72.0 - 100.0 mm/Hg    SODIUM 134 (L) 137 - 145 mmol/L    WHOLE BLOOD POTASSIUM 3.8 3.5 - 4.6 mmol/L    CHLORIDE 105  101 - 111 mmol/L    IONIZED CALCIUM 1.13 1.10 - 1.35 mmol/L    GLUCOSE 108 (H) 60 - 105 mg/dL    LACTATE 1.2 0.0 - 1.3 mmol/L    HEMOGLOBIN 13.9 12.0 - 18.0 g/dL    OXYHEMOGLOBIN 93.6 85.0 - 98.0 %    CARBOXYHEMOGLOBIN 2.2 0.0 - 2.5 %    MET-HEMOGLOBIN 0.8 0.0 - 2.0 %    O2CT 18.3 15.7 - 24.3 %    PAO2/FIO2 RATIO 122 <=200    HEMATOCRITRT 42 %   H & H   Result Value Ref Range    HGB 12.0 (L) 13.9 - 16.3 g/dL    HCT 34.8 (L) 36.0 - 46.0 %       Micro:   Hospital Encounter on 05/01/21 (from the past 96 hour(s))   RESPIRATORY CULTURE AND GRAM STAIN, AEROBIC    Collection Time: 05/02/21  9:45 AM    Specimen: Sputum    Narrative    The following orders were created for panel order RESPIRATORY CULTURE AND GRAM STAIN, AEROBIC.  Procedure                               Abnormality         Status                     ---------                               -----------         ------  SPUTUM MPNTIR[[id number]]                                    Final result                 Please view results for these tests on the individual orders.   RESPIRATORY CULTURE AND GRAM STAIN (PERFORMABLE)    Collection Time: 05/02/21  9:45 AM    Specimen: Sputum   Culture Result Status    RESPIRATORY CULTURE Moderate Growth Normal Respiratory Flora Preliminary    GRAM STAIN 1+ Rare Epithelial Cells Preliminary    GRAM STAIN 2+ Few WBCs Preliminary    GRAM STAIN 2+ Few Gram Positive Rods Preliminary    GRAM STAIN 3+ Several Gram Positive Coccobacilli Preliminary    GRAM STAIN 2+ Few Gram Positive Cocci in Pairs Preliminary    GRAM STAIN 2+ Few Gram Positive Cocci/Clusters Preliminary   ADULT ROUTINE BLOOD CULTURE, SET OF 2 ADULT BOTTLES (BACTERIA AND YEAST)    Collection Time: 05/02/21 10:05 AM    Specimen: Blood   Culture Result Status    BLOOD CULTURE, ROUTINE No Growth 18-24 hrs. Preliminary   ADULT ROUTINE BLOOD CULTURE, SET OF 2 ADULT BOTTLES (BACTERIA AND YEAST)    Collection Time: 05/02/21 10:17 AM    Specimen: Blood    Culture Result Status    BLOOD CULTURE, ROUTINE No Growth 18-24 hrs. Preliminary   MRSA SCREEN, PCR, RAPID    Collection Time: 05/02/21 11:02 AM    Specimen: Nasal Swab   Culture Result Status    MRSA COLONIZATION SCREEN Negative Final   LEGIONELLA URINE ANTIGEN    Collection Time: 05/02/21  3:05 PM    Specimen: Urine, Site not specified   Culture Result Status    LEGIONELLA ANTIGEN Negative Final   STREPTOCOCCUS PNEUMONIAE ANTIGEN,URINE    Collection Time: 05/02/21  3:05 PM    Specimen: Urine, Site not specified   Culture Result Status    S.PNEUMONIA ANTIGEN Negative Final       Vitals:   Vitals:    05/03/21 1101 05/03/21 1200 05/03/21 1453 05/03/21 1700   BP:       Pulse:       Resp:       Temp:  36.9 C (98.4 F)  36.5 C (97.7 F)   SpO2: 94%  93%    Weight:       Height:       BMI:           Vent Settings:  Mode: APV (CMV)  Set Rate: 22 Breaths Per Minute  Set PEEP: 5 cmH2O  FiO2: 60 %  I:E Ratio: 1:2     PHYSICAL EXAM:  General: 60yo male with moderate respiratory distress maintained on MV  Eyes: PERRLA, 26m brisk   HENT:ENMT without erythema or injection, mucous membranes moist. ETT and OGT present with no breakdown noted  Neck: supple, symmetrical, trachea midline  Lungs: Breath sounds with bilateral rhonchi. Maintained on mechanical ventilation. Respirations non-labored  Cardiovascular: Sinus rhythm, HR 82. Pulses 2+. Capillary refill brisk   Abdomen: Soft, round, non-tender on palpation, BS normoactive. TF infusing via OGT  Extremities: Spontaneously moves all extremities. No edema  Skin: Warm, dry, and intact. Red, moist/fungal rash noted to bilateral groins  Neurologic: Lethargic but oriented and followed commands prior to intubation. Now remains sedated with RASS -2. PERRL, protectives intact. Arouses to verbal stimulation  and makes eye contact. Does not follow commands while sedated   Psychiatric: Unable to assess     ASSESSMENT AND PLAN     Hector [name] is a 60 y.o., White male  admitted to ICU  for   Active Hospital Problems    Diagnosis   . Primary Problem: Spleen injury   . On mechanically assisted ventilation (CMS HCC)   . Multiple rib fractures   . Left pulmonary contusion   . Closed fracture of transverse process of thoracic vertebra (CMS HCC)   . 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 Thrombocytopenia (CMS HCC)   . Alcohol abuse        NEURO:  - Hx alcoholism  - S/p trauma with fall down 11 stairs while intoxicated -> CIWA Q4H with PRN Ativan   - CTH: No acute intracranial findings. Left posterior occipital scalp hematoma  - CT C-spine and lumbar spine negative   - CT thoracic spine: Multiple rib fractures (medial left 4th- 6th and sixth ribs and posterior lateral left 4th-7th . Possible non-displaced left transverse process fractures of T5 and T6   - Sedated with Propofol and maintained on mechanical ventilation  - Analgesic: Fentanyl infusion, Lidoderm patch   - Continue MV, thiamine -> Phenobarbital taper starter 5/19  - Daily spontaneous awakening trials  - Routine neurological exams     RESP:  - Acute respiratory failure with hypoxia -> transferred to ICU on 5/19 for intubation   -Maintained on mechanical ventilation; titration/optimization of ventilator setting for oxygenation/ventilation w/ daily assessment for SBT's as per protocol  - CTA chest: small left pneumothorax <5%. Atelectatasis bilaterally Rt>Lt. Trace left pleural effusion. Multiple displaced and comminuted left rib fractures. SubQ air left chest wall extending into the left flank   - Repeat CT 5/20: resolution of left pneumothorax, worsening bilateral lower lobe consolidation, decreasing subQ air in chest wall surrounding left sided rib fractures   - Possible simple pneumonia -> Unasyn and Azithro day #2  - Pulmonary hygiene  - DuoNebs every 4 hours as needed  - Serial CXRs and ABGs    CVS:  - Hx HTN  - Hypertensive -> resolved while sedated   - Routine hemodynamic monitoring and continuous telemetry     GI:  - CTA  abdomen/ pelvis: Grade 3 splenic injury. Cirrhosis with evidence of portal hypertension  - Repeat CT A/P 5/20: Stable splenic laceration.  - Splenic laceration -> continue serial H+H's to monitor for bleeding that could require embolization  - Nothing by mouth    - Tube feeding as tolerated   - Prophylaxis: IV Pepcid  - Bowel regimen: Colace and Senna    Renal:  - MIVF LR @100   - Foley catheter for strict I&O's  - Monitor and correct electrolyte imbalance as needed  - Serial BMPs    Intake/Output Summary (Last 24 hours) at 05/03/2021 1753  Last data filed at 05/03/2021 1700  Gross per 24 hour   Intake 4440.35 ml   Output 1821 ml   Net 2619.35 ml     Net IO Since Admission: 3,498.85 mL [05/03/21 1753]    HEME:  - No active bleeding noted; H+H stable   - Thrombocytopenia, platelets 56 -> continue to trend  - Concern for splenic laceration bleeding that could require embolization-> continue serial H+H Q8H  - No anticoagulation at this time -> SCD's for prophylaxis   - Serial CBC differential    ID:  - WBC 6.5  -  Possible pneumonia -> Unasyn and Azithro day #2  - Urine strep pneumo and legionella negative   - Procalcitonin 0.10  - Blood cultures NGTD  - UA doesn't appear infectious   - Sputum: few GPR, several GPC in pairs and clusters; culture pending   - Temp (24hrs) Max:38 C (100.4 F)    ENDO:  - TSH 4.13 and HA1C 5.3  - POCT glucometer Q 6 hours, add SSI as needed for optimal glycemic control     Prophylaxis: SCDs, Pepcid, Peridex, head of bed 30, pulmonary hygiene     Code Status:  Full Code     I have personally examined the patient at the bedside; reviewed the medical chart, laboratory data, radiologic imaging and discussed patient's case at bedside with multidisciplinary critical care team. Critical Care excludes any bedside procedures or teaching. Dr. Maurine Minister available for collaboration.     Critical care time excluding billable procedure is 38 minutes.     Letta Median, APRN

## 2021-05-03 NOTE — Care Plan (Signed)
OT order received and appreciated.  Attempted to see patient for initial evaluation this morning.  RN advises no therapy at this time.  He is sedated/intubated/vent and withdrawing from ETOH.  Plan:  will follow up on Monday 05/06/21 or as appropriate.  Thank you.  Landry Corporal, OT

## 2021-05-03 NOTE — Care Management Notes (Signed)
5/20  Remains in ICU   On 5/19  Had became lethargic  Increased SOB in resp distress transferred ti ICU and was Intubated     Remains on Vent today   ? Related to alcohol withdrawal   PICC line inserted  On IV ATX  IVF   D/C needs uncertain at this time   CM will follow

## 2021-05-03 NOTE — Progress Notes (Signed)
ICU round  1. THE PATIENT WAS SEEN AND DISCUSSED WITH THE NURSE PRACTITIONER ON CRITICAL CARE ROUNDS.  2.  WE DISCUSSED THE PATIENT'S DIAGNOSTIC TESTS AND MEDICATIONS.  3. WE DISCUSSED THE PLAN.  4.  I WAS AVAILABLE TO CONSULT WITH ON ANY PROBLEMS.    Waynard Reeds, MD  Waynard Reeds, MD  05/03/2021, 18:36

## 2021-05-04 ENCOUNTER — Ambulatory Visit (HOSPITAL_COMMUNITY)
Admission: RE | Admit: 2021-05-04 | Discharge: 2021-05-04 | Disposition: A | Discharge status: Home / Self Care | Payer: MEDICAID | Source: Ambulatory Visit

## 2021-05-04 ENCOUNTER — Ambulatory Visit (HOSPITAL_COMMUNITY): Payer: MEDICAID

## 2021-05-04 DIAGNOSIS — J90 Pleural effusion, not elsewhere classified: Secondary | ICD-10-CM | POA: Insufficient documentation

## 2021-05-04 DIAGNOSIS — J9690 Respiratory failure, unspecified, unspecified whether with hypoxia or hypercapnia: Secondary | ICD-10-CM | POA: Insufficient documentation

## 2021-05-04 DIAGNOSIS — S36031A Moderate laceration of spleen, initial encounter: Secondary | ICD-10-CM

## 2021-05-04 DIAGNOSIS — S270XXA Traumatic pneumothorax, initial encounter: Secondary | ICD-10-CM

## 2021-05-04 LAB — ARTERIAL BLOOD GAS, CO-OX, LYTES, LACTATE REFLEX
%FIO2 (ARTERIAL): 60 %
(T) PCO2: 45 mm/Hg (ref 35.0–45.0)
(T) PO2: 93 mm/Hg (ref 72.0–100.0)
BASE EXCESS (ARTERIAL): 1.8 mmol/L — ABNORMAL HIGH (ref 0.0–1.0)
BICARBONATE (ARTERIAL): 26.3 mmol/L — ABNORMAL HIGH (ref 18.0–26.0)
CARBOXYHEMOGLOBIN: 1.7 % (ref 0.0–2.5)
CHLORIDE: 106 mmol/L (ref 101–111)
GLUCOSE: 113 mg/dL — ABNORMAL HIGH (ref 60–105)
HEMATOCRITRT: 38 %
HEMOGLOBIN: 12.8 g/dL (ref 12.0–18.0)
IONIZED CALCIUM: 1.13 mmol/L (ref 1.10–1.35)
LACTATE: 0.9 mmol/L (ref 0.0–1.3)
MET-HEMOGLOBIN: 2.2 % — ABNORMAL HIGH (ref 0.0–2.0)
O2CT: 17.1 % (ref 15.7–24.3)
OXYHEMOGLOBIN: 94.2 % (ref 85.0–98.0)
PAO2/FIO2 RATIO: 155 (ref <=200)
PCO2 (ARTERIAL): 45 mm/Hg (ref 35–45)
PH (ARTERIAL): 7.39 (ref 7.35–7.45)
PH (T): 7.39 (ref 7.35–7.45)
PO2 (ARTERIAL): 93 mm/Hg (ref 72–100)
SODIUM: 136 mmol/L — ABNORMAL LOW (ref 137–145)
TEMPERATURE, COMP: 37 C
WHOLE BLOOD POTASSIUM: 3.6 mmol/L (ref 3.5–4.6)

## 2021-05-04 LAB — BASIC METABOLIC PANEL
ANION GAP: 2 mmol/L — ABNORMAL LOW (ref 5–19)
BUN/CREA RATIO: 22 — ABNORMAL HIGH (ref 6–20)
BUN: 12 mg/dL (ref 9–20)
CALCIUM: 7.9 mg/dL — ABNORMAL LOW (ref 8.4–10.2)
CHLORIDE: 107 mmol/L (ref 98–107)
CO2 TOTAL: 28 mmol/L (ref 22–30)
CREATININE: 0.54 mg/dL — ABNORMAL LOW (ref 0.66–1.20)
ESTIMATED GFR: >60 mL/min/{1.73_m2} (ref >60)
GLUCOSE: 118 mg/dL — ABNORMAL HIGH (ref 74–106)
POTASSIUM: 3.7 mmol/L (ref 3.5–5.1)
SODIUM: 137 mmol/L (ref 137–145)

## 2021-05-04 LAB — CBC
HCT: 36.2 % (ref 36.0–46.0)
HGB: 12 g/dL — ABNORMAL LOW (ref 13.9–16.3)
MCH: 33.7 pg (ref 25.4–34.0)
MCHC: 33.2 g/dL (ref 30.0–37.0)
MCV: 101.6 fL — ABNORMAL HIGH (ref 80.0–100.0)
MPV: 9.1 fL (ref 7.5–11.5)
PLATELETS: 69 10*3/uL — ABNORMAL LOW (ref 130–400)
RBC: 3.57 10*6/uL — ABNORMAL LOW (ref 4.30–5.90)
RDW: 16 % — ABNORMAL HIGH (ref 11.5–14.0)
WBC: 5.2 10*3/uL (ref 4.5–11.5)

## 2021-05-04 LAB — MAGNESIUM: MAGNESIUM: 2.1 mg/dL (ref 1.6–2.3)

## 2021-05-04 LAB — PHOSPHORUS: PHOSPHORUS: 3 mg/dL (ref 2.5–4.5)

## 2021-05-04 MED ORDER — SODIUM CHLORIDE 0.9 % INTRAVENOUS PIGGYBACK
3.0000 g | Freq: Four times a day (QID) | INTRAVENOUS | Status: DC
Start: 2021-05-04 — End: 2021-05-14
  Administered 2021-05-04: 0 g via INTRAVENOUS
  Administered 2021-05-04 (×2): 3 g via INTRAVENOUS
  Administered 2021-05-04: 0 g via INTRAVENOUS
  Administered 2021-05-05: 3 g via INTRAVENOUS
  Administered 2021-05-05 (×2): 0 g via INTRAVENOUS
  Administered 2021-05-05: 3 g via INTRAVENOUS
  Administered 2021-05-05 (×2): 0 g via INTRAVENOUS
  Administered 2021-05-05 – 2021-05-06 (×4): 3 g via INTRAVENOUS
  Administered 2021-05-06 (×2): 0 g via INTRAVENOUS
  Administered 2021-05-06: 3 g via INTRAVENOUS
  Administered 2021-05-06 (×2): 0 g via INTRAVENOUS
  Administered 2021-05-06: 3 g via INTRAVENOUS
  Administered 2021-05-07: 0 g via INTRAVENOUS
  Administered 2021-05-07: 3 g via INTRAVENOUS
  Administered 2021-05-07: 0 g via INTRAVENOUS
  Administered 2021-05-07: 3 g via INTRAVENOUS
  Administered 2021-05-07: 0 g via INTRAVENOUS
  Administered 2021-05-07 (×2): 3 g via INTRAVENOUS
  Administered 2021-05-07: 0 g via INTRAVENOUS
  Administered 2021-05-08 (×2): 3 g via INTRAVENOUS
  Administered 2021-05-08 (×2): 0 g via INTRAVENOUS
  Administered 2021-05-08: 3 g via INTRAVENOUS
  Administered 2021-05-08 (×2): 0 g via INTRAVENOUS
  Administered 2021-05-08 – 2021-05-09 (×3): 3 g via INTRAVENOUS
  Administered 2021-05-09 (×2): 0 g via INTRAVENOUS
  Administered 2021-05-09: 3 g via INTRAVENOUS
  Administered 2021-05-09 (×2): 0 g via INTRAVENOUS
  Administered 2021-05-09 – 2021-05-10 (×2): 3 g via INTRAVENOUS
  Administered 2021-05-10 (×2): 0 g via INTRAVENOUS
  Administered 2021-05-10: 3 g via INTRAVENOUS
  Administered 2021-05-10 (×2): 0 g via INTRAVENOUS
  Administered 2021-05-10 (×2): 3 g via INTRAVENOUS
  Administered 2021-05-11: 0 g via INTRAVENOUS
  Administered 2021-05-11: 3 g via INTRAVENOUS
  Administered 2021-05-11: 0 g via INTRAVENOUS
  Administered 2021-05-11: 3 g via INTRAVENOUS
  Administered 2021-05-11: 0 g via INTRAVENOUS
  Administered 2021-05-11: 3 g via INTRAVENOUS
  Administered 2021-05-11: 0 g via INTRAVENOUS
  Administered 2021-05-11 – 2021-05-12 (×2): 3 g via INTRAVENOUS
  Administered 2021-05-12: 0 g via INTRAVENOUS
  Administered 2021-05-12: 3 g via INTRAVENOUS
  Administered 2021-05-12 (×2): 0 g via INTRAVENOUS
  Administered 2021-05-12: 3 g via INTRAVENOUS
  Administered 2021-05-12: 0 g via INTRAVENOUS
  Administered 2021-05-12: 3 g via INTRAVENOUS
  Administered 2021-05-13: 0 g via INTRAVENOUS
  Administered 2021-05-13 (×2): 3 g via INTRAVENOUS
  Administered 2021-05-13: 0 g via INTRAVENOUS
  Administered 2021-05-13 (×2): 3 g via INTRAVENOUS
  Administered 2021-05-13 – 2021-05-14 (×3): 0 g via INTRAVENOUS
  Administered 2021-05-14 (×2): 3 g via INTRAVENOUS
  Administered 2021-05-14: 0 g via INTRAVENOUS
  Filled 2021-05-04 (×44): qty 3000

## 2021-05-04 NOTE — Ancillary Notes (Addendum)
[facility]  Medical Nutrition Therapy Follow Up    SUBJECTIVE: Pt seen in ICU this AM. Jevity 1.2 running at 40ml/hr per pump review providing ~576kcals; propofol running at 17.25ml/hr; 456kcals to total 1032kcals and 27gms protein. Pt tolerating feedings.     Patient Active Problem List   Diagnosis   . Multiple rib fractures   . Left pulmonary contusion   . Spleen injury   . Closed fracture of transverse process of thoracic vertebra (CMS HCC)   . 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 Thrombocytopenia (CMS HCC)   . Alcohol abuse   . On mechanically assisted ventilation (CMS HCC)     BMP (Last 48 Hours):  Recent Results in last 48 hours     05/03/21  0421 05/03/21  0449 05/04/21  0428 05/04/21  0431   SODIUM 136* 134* 136* 137   POTASSIUM 3.6  --   --  3.7   CHLORIDE 107 105 106 107   CO2 26  --   --  28   BUN 13  --   --  12   CREATININE 0.61*  --   --  0.54*   CALCIUM 7.8*  --   --  7.9*   GLUCOSENF 114*  --   --  118*       Current Diet Order/Nutrition Support:  DIET NPO - NOW  TUBE FEED - CONTINUOUS DRIP CONTINUOUS NO MEALS, TF ONLY; Jevity 1.2; OG; Goal Rate (ml/hr): 0; Initial Rate (ml/hr): 20     Re-assessed needs if applicable:  [id number] (22-25kcal/kg) ABW  97-129.6gms protein (1.2-1.6gm/kg pro IBW)  Fluids per Physician.    Plan/Interventions : May consider lower fat formula such as Vital High Protein.   May consider Vital High Protein at 50ml/hr as goal in which provides ~1440kcals (1896kcals with propofol), 126gms protein, and 1239ml free water. Start at 66ml/hr and advance to goal as tolerated by patient. Additional flush per Physician.  Will continue to follow and monitor.     Rulon Abide, RDLD  05/04/2021, 10:30

## 2021-05-04 NOTE — Progress Notes (Signed)
[facility]    ICU Progress Note    Patient Name: Freeman Borba  Date: 05/04/2021   Department:  [facility]  MRN: [id number]  DOB: 08-19-1961    Active Hospital Problems    Diagnosis   . On mechanically assisted ventilation (CMS HCC)   . Multiple rib fractures   . Left pulmonary contusion   . 1Spleen injury   . Closed fracture of transverse process of thoracic vertebra (CMS HCC)   . 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 Thrombocytopenia (CMS HCC)   . Alcohol abuse         HPI: Garold Sheeler is a 60 y.o., male with past medical history of alcoholism and hypertension who presented as a level 2 trauma s/p falling down approximately 11 stairs while intoxicated. He initially refused medical treatment but presented to the ED on 5/18 after awaking later that morning with severe shortness of breath and posterior chest wall pain. He was noted to have bilateral shoulder and left posterior scalp ecchymosis, left forearm skin tear, and a torn right great toe torn toenail. CTA C/A/P revealed a small left pneumothorax <5%. Atelectatasis bilaterally Rt>Lt. Trace left pleural effusion. Multiple displaced and comminuted left rib fractures. SubQ air left chest wall extending into the left flank. Grade 3 Splenic Trauma. Cirrhosis with evidence of portal hypertension. CT thoracic spine showed multiple rib fractures (medial left 4th- 6th and sixth ribs and posterior lateral left 4th-7th . Possible non-displaced left transverse process fractures of T5 and T6. He was admitted to [facility] under the Trauma Service. Communication with Vascular Surgery, although not a formal consult, regarding concern for splenic laceration bleeding. Close monitoring of H+H's.   On 5/19, on assessment he was noted to be lethargic with increased work of breathing, accessory muscle use, and gurgling respirations. There was concern he was unable to clear his secretions. He was transferred to ICU for intubation. On arrival to ICU,  he was lethargic but oriented and answered questions appropriately. On 100% NRB with increased work of breathing and subsequently intubated for airway protection. Hypertension post intubation that improved with sedation.   Per significant other, [name], the patient drinks approximately15 beers and 2 [employer]/ daily but occasionally more. He is functioning in the morning and is able to complete tasks around the house but does not drive or leave the house. He begins drinking in the afternoon and is unable to stand/ walk by night.     5/20: Remains critically ill in ICU on mechanical ventilation. No acute events overnight. Repeat CT C/A/P showed resolution of left pneumothorax, worsening bilateral lower lobe consolidation, decreasing subQ air in chest wall surrounding left sided rib fractures, and a stable splenic laceration. Tube feeding started.    5/21: No acute events overnight, remains critically ill intubated and sedated on the vent. Currently on a Phenobarb taper for ETOH withdrawal and tolerating well. Sputum culture + today for Streptococcus P and Unasyn dosing increased to 3G for more complete coverage.       PMHx:   Past Medical History:   Diagnosis Date   . Alcohol abuse    . Cirrhosis (CMS [place])    . Delirium, withdrawal, alcoholic (CMS HCC)    . HTN (hypertension)    . Unknown cause of injury     fx nose   . Wears glasses        PSHx:  Past Surgical History:   Procedure Laterality Date   .  HX TONSILLECTOMY           FHx:  Family History   Problem Relation Name Age of Onset   . Asthma Father          SHx:  Social History     Socioeconomic History   . Marital status: Divorced   Tobacco Use   . Smoking status: Current Every Day Smoker     Packs/day: 1.50   . Smokeless tobacco: Never Used   Vaping Use   . Vaping Use: Never used   Substance and Sexual Activity   . Alcohol use: Yes     Alcohol/week: 15.0 standard drinks     Types: 15 Cans of beer per week     Comment: daily   . Drug use: Yes     Types:  Marijuana     Comment: daily   . Sexual activity: Yes        ROS:  Unable to assess ROS due to patient being intubated and sedated on the vent.     Imaging:   XR AP MOBILE CHEST   Final Result   No significant change.         Radiologist location ID: [id number]         CT CHEST ABDOMEN PELVIS W IV CONTRAST   Final Result      RESPIRATORY MOTION DEGRADED EXAM.      INTERVAL RESOLUTION OF THE PREVIOUSLY SEEN SMALL LEFT-SIDED PNEUMOTHORAX.      WORSENING BILATERAL LOWER LOBE CONSOLIDATION THAT COULD REFLECT ATELECTASIS AND/OR PNEUMONIA      REDEMONSTRATION OF MULTIPLE DISPLACED LEFT-SIDED RIB FRACTURES WITH DECREASING SUBCUTANEOUS AIR IN THE CHEST WALL.      THE KNOWN GRADE 3 SPLENIC LACERATION IS NOT AS WELL VISUALIZED ON THE CURRENT EXAM AND IS STABLE TO IMPROVED FROM THE 05/01/2021 EXAM. NO NEW ACUTE FINDINGS IN THE ABDOMEN/PELVIS.         One or more dose reduction techniques were used (e.g., Automated exposure control, adjustment of the mA and/or kV according to patient size, use of iterative reconstruction technique).         Radiologist location ID: [id number]         XR AP MOBILE CHEST   Final Result   NO SIGNIFICANT CHANGE SINCE THE PRIOR EXAM.         Radiologist location ID: [id number]         XR AP MOBILE CHEST   Final Result   No interval change from 05/02/2021 in the lung fields.         Radiologist location ID: [id number]         XR AP MOBILE CHEST   Final Result   Stable examination compared to 05/02/2021.         Radiologist location ID: [id number]         XR AP MOBILE CHEST   Final Result   1.Decreased left basilar atelectasis or pneumonia. Right basilar atelectasis or pneumonia is unchanged.   2.Left rib fractures without pneumothorax.         Radiologist location ID: [id number]         CT LUMBAR SPINE WO IV CONTRAST   Final Result   NO ACUTE LUMBAR FRACTURE.  DEGENERATIVE CHANGES.         One or more dose reduction techniques were used (e.g., Automated exposure control, adjustment of the mA  and/or kV according to patient size, use of iterative reconstruction technique).  Radiologist location ID: [id number]         CT THORACIC SPINE WO IV CONTRAST   Final Result   1.Multiple medial and posterior left rib fractures as described. There are possible transverse process fractures at the T5 and T6 levels on the left as well   2.Small medial left pneumothorax. There is soft tissue air on the left. There is a left basal infiltrate and small left effusion         One or more dose reduction techniques were used (e.g., Automated exposure control, adjustment of the mA and/or kV according to patient size, use of iterative reconstruction technique).         Radiologist location ID: [id number]         CT CERVICAL SPINE WO IV CONTRAST   Final Result   NO ACUTE CERVICAL FRACTURE.  DEGENERATIVE CHANGES.         One or more dose reduction techniques were used (e.g., Automated exposure control, adjustment of the mA and/or kV according to patient size, use of iterative reconstruction technique).         Radiologist location ID: [id number]         TRAUMA CTA CHEST W CT ABDOMEN PELVIS W IV CONTRAST   Final Result   Addendum 1 of 1   ADDENDUM:      A Critical Document Only message has been documented for [place] ANDERSON in    the [employer] Actionable Findings system on 05/01/2021    10:19 AM, Message ID [id number].         Radiologist location ID: [id number]         Final   Grade 3 Splenic Trauma, per the American Association for the Surgery of Trauma (AAST) injury grading system, as described above.      Cirrhosis with evidence of portal hypertension      Small left pneumothorax measuring less than 5%      Atelectatic changes at the lung bases with greater the right with a trace left effusion.      Multiple displaced and comminuted left rib fractures.   Subcutaneous air left chest wall extending into the left flank.         One or more dose reduction techniques were used (e.g., Automated exposure  control, adjustment of the mA and/or kV according to patient size, use of iterative reconstruction technique).         Radiologist location ID: [id number]         CT BRAIN WO IV CONTRAST   Final Result   No evidence of acute intracranial injury or change.         One or more dose reduction techniques were used (e.g., Automated exposure control, adjustment of the mA and/or kV according to patient size, use of iterative reconstruction technique).         Radiologist location ID: [id number]         XR AP MOBILE CHEST   Final Result   Bilateral left greater than right acute on chronic infiltrates suggested with possible left effusion. Recommend follow-up.         Radiologist location ID: [id number]:  Results for orders placed or performed during the hospital encounter of 05/01/21 (from the past 24 hour(s))   H & H   Result Value Ref Range    HGB 12.2 (L) 13.9 - 16.3 g/dL    HCT 37.2  36.0 - 46.0 %   ARTERIAL BLOOD GAS, CO-OX, LYTES, LACTATE REFLEX   Result Value Ref Range    %FIO2 (ARTERIAL) 60 %    PH (ARTERIAL) 7.39 7.35 - 7.45    PCO2 (ARTERIAL) 45 35 - 45 mm/Hg    PO2 (ARTERIAL) 93 72 - 100 mm/Hg    BASE EXCESS (ARTERIAL) 1.8 (H) 0.0 - 1.0 mmol/L    BICARBONATE (ARTERIAL) 26.3 (H) 18.0 - 26.0 mmol/L    TEMPERATURE, COMP 37.0   C    PH (T) 7.39 7.35 - 7.45    (T) PCO2 45.0 35.0 - 45.0 mm/Hg    (T) PO2 93.0 72.0 - 100.0 mm/Hg    SODIUM 136 (L) 137 - 145 mmol/L    WHOLE BLOOD POTASSIUM 3.6 3.5 - 4.6 mmol/L    CHLORIDE 106 101 - 111 mmol/L    IONIZED CALCIUM 1.13 1.10 - 1.35 mmol/L    GLUCOSE 113 (H) 60 - 105 mg/dL    LACTATE 0.9 0.0 - 1.3 mmol/L    HEMOGLOBIN 12.8 12.0 - 18.0 g/dL    OXYHEMOGLOBIN 94.2 85.0 - 98.0 %    CARBOXYHEMOGLOBIN 1.7 0.0 - 2.5 %    MET-HEMOGLOBIN 2.2 (H) 0.0 - 2.0 %    O2CT 17.1 15.7 - 24.3 %    PAO2/FIO2 RATIO 155 <=200    HEMATOCRITRT 38 %   BASIC METABOLIC PANEL   Result Value Ref Range    SODIUM 137 137 - 145 mmol/L    POTASSIUM 3.7 3.5 - 5.1 mmol/L    CHLORIDE 107 98  - 107 mmol/L    CO2 TOTAL 28 22 - 30 mmol/L    ANION GAP 2 (L) 5 - 19 mmol/L    CALCIUM 7.9 (L) 8.4 - 10.2 mg/dL    GLUCOSE 118 (H) 74 - 106 mg/dL    BUN 12 9 - 20 mg/dL    CREATININE 0.54 (L) 0.66 - 1.20 mg/dL    BUN/CREA RATIO 22 (H) 6 - 20    ESTIMATED GFR >60 >60 mL/min/1.5m2   PHOSPHORUS   Result Value Ref Range    PHOSPHORUS 3.0 2.5 - 4.5 mg/dL   MAGNESIUM   Result Value Ref Range    MAGNESIUM 2.1 1.6 - 2.3 mg/dL   CBC   Result Value Ref Range    WBC 5.2 4.5 - 11.5 x10^3/uL    RBC 3.57 (L) 4.30 - 5.90 x10^6/uL    HGB 12.0 (L) 13.9 - 16.3 g/dL    HCT 36.2 36.0 - 46.0 %    MCV 101.6 (H) 80.0 - 100.0 fL    MCH 33.7 25.4 - 34.0 pg    MCHC 33.2 30.0 - 37.0 g/dL    RDW 16.0 (H) 11.5 - 14.0 %    PLATELETS 69 (L) 130 - 400 x10^3/uL    MPV 9.1 7.5 - 11.5 fL       Micro:   Hospital Encounter on 05/01/21 (from the past 96 hour(s))   RESPIRATORY CULTURE AND GRAM STAIN, AEROBIC    Collection Time: 05/02/21  9:45 AM    Specimen: Sputum    Narrative    The following orders were created for panel order RESPIRATORY CULTURE AND GRAM STAIN, AEROBIC.  Procedure                               Abnormality         Status                     ---------                               -----------         ------  SPUTUM GOTLXB[[id number]]                                    Final result                 Please view results for these tests on the individual orders.   RESPIRATORY CULTURE AND GRAM STAIN (PERFORMABLE)    Collection Time: 05/02/21  9:45 AM    Specimen: Sputum   Culture Result Status    RESPIRATORY CULTURE Moderate Growth Streptococcus pneumoniae (A) Preliminary    RESPIRATORY CULTURE Light Growth Gram Negative Flora Preliminary    GRAM STAIN 1+ Rare Epithelial Cells Preliminary    GRAM STAIN 2+ Few WBCs Preliminary    GRAM STAIN 2+ Few Gram Positive Rods Preliminary    GRAM STAIN 3+ Several Gram Positive Coccobacilli Preliminary    GRAM STAIN 2+ Few Gram Positive Cocci in Pairs Preliminary    GRAM STAIN 2+ Few  Gram Positive Cocci/Clusters Preliminary   ADULT ROUTINE BLOOD CULTURE, SET OF 2 ADULT BOTTLES (BACTERIA AND YEAST)    Collection Time: 05/02/21 10:05 AM    Specimen: Blood   Culture Result Status    BLOOD CULTURE, ROUTINE No Growth 2 Days Preliminary   ADULT ROUTINE BLOOD CULTURE, SET OF 2 ADULT BOTTLES (BACTERIA AND YEAST)    Collection Time: 05/02/21 10:17 AM    Specimen: Blood   Culture Result Status    BLOOD CULTURE, ROUTINE No Growth 2 Days Preliminary   MRSA SCREEN, PCR, RAPID    Collection Time: 05/02/21 11:02 AM    Specimen: Nasal Swab   Culture Result Status    MRSA COLONIZATION SCREEN Negative Final   LEGIONELLA URINE ANTIGEN    Collection Time: 05/02/21  3:05 PM    Specimen: Urine, Site not specified   Culture Result Status    LEGIONELLA ANTIGEN Negative Final   STREPTOCOCCUS PNEUMONIAE ANTIGEN,URINE    Collection Time: 05/02/21  3:05 PM    Specimen: Urine, Site not specified   Culture Result Status    S.PNEUMONIA ANTIGEN Negative Final       Vitals:   Vitals:    05/04/21 1400 05/04/21 1442 05/04/21 1600 05/04/21 1838   BP: 115/73      Pulse:       Resp:       Temp:   37.3 C (99.1 F)    SpO2:  91%  96%   Weight:       Height:       BMI:                Vent Settings:  Mode: APV (CMV)  Set Rate: 22 Breaths Per Minute  Set PEEP: 5 cmH2O  FiO2: 60 %  I:E Ratio: 1:2     PHYSICAL EXAM:  General: Intubated and sedated on the vent. Resting quietly at this time.  Eyes: PERRLA, 59m brisk. Protectives intact, unable to assess EOM's due sedation.  HENT: Ears and nose appear normal.  Mucous membranes moist. ETT intact, no breakdown noted. OGT present.   Neck: supple, symmetrical, trachea midline.  Lungs: Breath sounds with bilateral rhonchi. Maintained on mechanical ventilation. Respirations non-labored  Cardiovascular: Sinus rhythm, HR 80's. Pulses 2+. Capillary refill brisk. No edema.   Abdomen: Soft, round, non-tender on palpation, BS normoactive. TF infusing via OGT  Extremities: Spontaneously moves all  extremities.   Skin: Warm, dry, and intact. Red, moist/fungal rash noted to  bilateral groin  Neurologic: Lethargic but oriented and followed commands prior to intubation. Now remains sedated with RASS -2. PERRL, protectives intact. Arouses to verbal stimulation and makes eye contact. Does not follow commands while sedated   Psychiatric: Unable to assess due to patient being sedated.      ASSESSMENT AND PLAN     Genesis Paget is a 60 y.o., White male  admitted to ICU for   Active Hospital Problems    Diagnosis   . Primary Problem: Spleen injury   . On mechanically assisted ventilation (CMS HCC)   . Multiple rib fractures   . Left pulmonary contusion   . Closed fracture of transverse process of thoracic vertebra (CMS HCC)   . 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 Thrombocytopenia (CMS HCC)   . Alcohol abuse      NEURO:  - Hx alcoholism  - S/p trauma with fall down 11 stairs while intoxicated -> CIWA Q4H with PRN Ativan   - CTH: No acute intracranial findings. Left posterior occipital scalp hematoma  - CT C-spine and lumbar spine negative   - CT thoracic spine: Multiple rib fractures (medial left 4th- 6th and sixth ribs and posterior lateral left 4th-7th . Possible non-displaced left transverse process fractures of T5 and T6   - Sedated with Propofol and maintained on mechanical ventilation  - Analgesic: Fentanyl infusion, Lidoderm patch   - Continue MV, thiamine -> Phenobarbital taper starter 5/19  - Daily spontaneous awakening trials  - Routine neurological exams     RESP:  - Acute respiratory failure with hypoxia -> transferred to ICU on 5/19 for intubation   -Maintained on mechanical ventilation; titration/optimization of ventilator setting for oxygenation/ventilation w/ daily assessment for SBT's as per protocol  - CTA chest: small left pneumothorax <5%. Atelectatasis bilaterally Rt>Lt. Trace left pleural effusion. Multiple displaced and comminuted left rib fractures. SubQ air left chest wall  extending into the left flank   - Repeat CT 5/20: resolution of left pneumothorax, worsening bilateral lower lobe consolidation, decreasing subQ air in chest wall surrounding left sided rib fractures   - Possible simple pna  -> Unasyn and Azithro day #3 - sputum with Strep and Unasyn dosing increased to 3 gms.  - Pulmonary hygiene  - DuoNebs every 4 hours as needed  - Serial CXRs and ABGs    CVS:  - Hx HTN  - Hypertensive -> resolved while sedated   - Routine hemodynamic monitoring and continuous telemetry     GI:  - CTA abdomen/ pelvis: Grade 3 splenic injury. Cirrhosis with evidence of portal hypertension  - Repeat CT A/P on 5/20: Stable splenic laceration.  - Splenic laceration -> continue serial H/H's to monitor for bleeding that could require embolization  - NPO   - Tube feeding as tolerated   - Prophylaxis: IV Pepcid  - Bowel regimen: Colace and Senna    Renal:  - MIVF LR @100   - Foley catheter for strict I/O's  - Monitor and correct electrolyte imbalance as needed  - Serial BMPs      Intake/Output Summary (Last 24 hours) at 05/04/2021 1951  Last data filed at 05/04/2021 1800  Gross per 24 hour   Intake 4292.21 ml   Output 893 ml   Net 3399.21 ml     Net IO Since Admission: 6,995.36 mL [05/04/21 1951]    HEME:  - No active bleeding noted; H/H stable   - Thrombocytopenia, platelets 56 -> 69 and  continue to trend  - Concern for splenic laceration bleeding that could require embolization-> continue serial H/H Q8H  - No anticoagulation at this time -> SCD's for prophylaxis   - Serial CBC differential    ID:  - WBC 6.5  - Possible pneumonia -> Unasyn and Azithro day #3 (Unasyn dose increased 5/21)  - Urine strep pneumo and legionella negative   - Procalcitonin 0.10  - Blood cultures NGTD  - UA with no signs of infective source   - Sputum: few GPR, several GPC in pairs and clusters; culture pending     ENDO:  - TSH 4.13 and HA1C 5.3  - POCT glucometer Q 6 hours, add SSI as needed for optimal glycemic control and  will continue adjust as necessary to achieve optimal BS levels.      Prophylaxis:   - SCDs, Pepcid, Peridex, HOB > 30, pulmonary hygiene     Code Status:  Full Code     I have personally examined the patient at the bedside; reviewed the medical chart, laboratory data, radiologic imaging and discussed patient's case at bedside with multidisciplinary critical care team. Critical Care excludes any bedside procedures or teaching. Dr. Maurine Minister available for collaboration at all times.     Critical care time excluding billable procedure is 38 minutes.     Charlena Cross, CRNP  05/04/2021, 19:51         ICU round  1. THE PATIENT WAS SEEN AND EXAMINED WITH THE NURSE PRACTITIONER ON CRITICAL CARE ROUNDS.  2.  WE DISCUSSED THE PATIENT'S DIAGNOSTIC TESTS AND MEDICATIONS.  3. WE DISCUSSED THE PLAN.  4.  I WAS AVAILABLE TO CONSULT WITH ON ANY PROBLEMS.  Waynard Reeds, MD  Waynard Reeds, MD  05/04/2021, 20:26

## 2021-05-04 NOTE — Progress Notes (Signed)
Name: Zach Tietje MRN:  [id number]   Date of admission: 05/01/2021 Age: 60 y.o.       Name - Jandiel Magallanes    Date of service - 05/04/2021      Interval history  No acute events overnight.  The patient remains intubated in the ICU.  Repeat CT showed resolution of left PTX and stable splenic injury.  Pt is tolerating tube feedings at 20cc/hr.    Physical exam  Filed Vitals:    05/04/21 0600 05/04/21 0630 05/04/21 0734 05/04/21 0800   BP: 113/69 113/76     Pulse: 89 97     Resp: (!) 23 (!) 22     Temp:    37.3 C (99.1 F)   SpO2: 94% 95% 92%           Intake/Output Summary (Last 24 hours) at 05/04/2021 0850  Last data filed at 05/04/2021 0700  Gross per 24 hour   Intake 4451.51 ml   Output 888 ml   Net 3563.51 ml        General:  Intubated in the ICU with sedation  Abdomen: soft, nontender, nondistended, small umbilical hernia  Skin: No jaundice, lesions, or rashes.   Lungs:  On the ventilator and lungs are clear this morning  CV:  Hemodynamically stable  Extremities: no edema      Labs/Imaging -     CT CHEST ABDOMEN PELVIS W IV CONTRAST performed on 05/03/2021 10:02 AM    CLINICAL HISTORY: Reassess grade 3 splenic laceration and rib fractures/pneumothorax.  increasing respiratory distress    TECHNIQUE:  Chest, abdomen and pelvis CT with intravenous contrast.  CONTRAST:  75 ml's of Isovue 300    COMPARISON:  None.  # of known CTs in the past 12 months: 0   # of known Cardiac Nuclear Medicine Studies in the past 12 months: 0    FINDINGS:    Image quality is degraded secondary to respiratory motion artifact.    CT CHEST:  Hardware:  Endotracheal tube in satisfactory position. Transesophageal catheter terminates in the stomach.    Lymph nodes:   No mediastinal, hilar, or axillary lymphadenopathy.    Heart and Vasculature:  Cardiomegaly.  No pericardial effusion. Thoracic aorta and pulmonary arteries are unremarkable.      Lungs and Airways:  Worsening bilateral lower lobe consolidation. Mild underlying  emphysematous changes.    Pleura: Trace left pleural effusion. Interval resolution of the previously seen small left-sided pneumothorax.    Bones: Redemonstration of multiple displaced left-sided rib fractures. Interval decrease in the subcutaneous air in the left chest wall.      CT ABDOMEN/PELVIS:  Liver:   Nodular surface contours with hypertrophy of the caudate lobe compatible with cirrhosis    Gallbladder:   Cholelithiasis    Spleen:   The known splenic laceration is not as well visualized on the current exam. No abnormal perisplenic fluid collection is seen.    Pancreas:   Unremarkable.    Adrenals:   Unremarkable.    Kidneys:   Unremarkable.    Bladder:  Decompressed with an indwelling Foley catheter    Prostate:  Unremarkable.    Bowel:   No bowel obstruction.    Appendix:  Not visualized    Lymph nodes:  No suspicious lymph node enlargement.    Vasculature:   Mild diffuse atherosclerotic calcifications are noted.     Peritoneum / Retroperitoneum: No ascites.  No free air.    Bones:  Degenerative changes of the spine.        IMPRESSION:    RESPIRATORY MOTION DEGRADED EXAM.    INTERVAL RESOLUTION OF THE PREVIOUSLY SEEN SMALL LEFT-SIDED PNEUMOTHORAX.    WORSENING BILATERAL LOWER LOBE CONSOLIDATION THAT COULD REFLECT ATELECTASIS AND/OR PNEUMONIA    REDEMONSTRATION OF MULTIPLE DISPLACED LEFT-SIDED RIB FRACTURES WITH DECREASING SUBCUTANEOUS AIR IN THE CHEST WALL.    THE KNOWN GRADE 3 SPLENIC LACERATION IS NOT AS WELL VISUALIZED ON THE CURRENT EXAM AND IS STABLE TO IMPROVED FROM THE 05/01/2021 EXAM. NO NEW ACUTE FINDINGS IN THE ABDOMEN/PELVIS.        XR AP MOBILE CHEST performed on 05/04/2021 7:03 AM    CLINICAL HISTORY: Trauma.  resp fail-vent    TECHNIQUE: Frontal view of the chest.    COMPARISON:  05/03/2021    FINDINGS:  Endotracheal tube in place whose tip is 3.9 cm from the carina. Nasogastric tube in place terminating over the stomach. Left arm PICC in place whose tip is in the  lower SVC.   Heart size is moderately enlarged.     Small bilateral pleural effusions similar prior exam. Bibasilar atelectasis/pneumonia. No pneumothorax nor vascular congestion. No significant change.         ASSESSMENT & RECOMMENDATIONS:   59 y.o.malestatus post fall downstairs sustaining multiple left-sided rib fractures with subcutaneous emphysema,transverse process fracture of thoracic spine,and grade 3 splenic laceration  -patient to remain intubated in the ICU... the main issue at this time will be EtOH withdrawl  - Tube feedings can be advanced to max as tolerated  - antibiotics for the developing pneumonia  - continue close monitoring of his hemoglobin, we will transfuse as needed and likely give platelets if he requires transfusion  -patient is a daily alcoholic with evidence of cirrhosis with portal hypertension and thrombocytopenia.Marland KitchenMarland KitchenIf there is concern about bleeding from his splenic laceration then he will need to be embolized by Interventional Radiology or vascular surgery.Marland KitchenMarland KitchenPatient is an extremely poor surgical candidate  - can the with daily chest x-rays to monitor the pneumothorax, we will have a low threshold to repeat a chest x-ray if there is any question about increasing FiO2 requirements    Clinton Quant, MD      Note: A portion of this documentation was generated using MMODAL (voice recognition software) and may contain syntax/voice recognition errors.

## 2021-05-05 ENCOUNTER — Inpatient Hospital Stay (HOSPITAL_COMMUNITY): Payer: MEDICAID

## 2021-05-05 ENCOUNTER — Ambulatory Visit (HOSPITAL_COMMUNITY): Payer: MEDICAID

## 2021-05-05 ENCOUNTER — Ambulatory Visit (HOSPITAL_COMMUNITY)
Admission: RE | Admit: 2021-05-05 | Discharge: 2021-05-05 | Disposition: A | Discharge status: Home / Self Care | Payer: MEDICAID | Source: Ambulatory Visit

## 2021-05-05 ENCOUNTER — Other Ambulatory Visit: Payer: Self-pay

## 2021-05-05 DIAGNOSIS — I517 Cardiomegaly: Secondary | ICD-10-CM | POA: Insufficient documentation

## 2021-05-05 DIAGNOSIS — S299XXA Unspecified injury of thorax, initial encounter: Secondary | ICD-10-CM | POA: Insufficient documentation

## 2021-05-05 DIAGNOSIS — J189 Pneumonia, unspecified organism: Secondary | ICD-10-CM

## 2021-05-05 DIAGNOSIS — J90 Pleural effusion, not elsewhere classified: Secondary | ICD-10-CM | POA: Insufficient documentation

## 2021-05-05 DIAGNOSIS — Z6831 Body mass index (BMI) 31.0-31.9, adult: Secondary | ICD-10-CM

## 2021-05-05 LAB — CBC
HCT: 29.1 % — ABNORMAL LOW (ref 36.0–46.0)
HGB: 10.3 g/dL — ABNORMAL LOW (ref 13.9–16.3)
MCH: 36.1 pg — ABNORMAL HIGH (ref 25.4–34.0)
MCHC: 35.2 g/dL (ref 30.0–37.0)
MCV: 102.6 fL — ABNORMAL HIGH (ref 80.0–100.0)
MPV: 9.4 fL (ref 7.5–11.5)
PLATELETS: 61 10*3/uL — ABNORMAL LOW (ref 130–400)
RBC: 2.84 10*6/uL — ABNORMAL LOW (ref 4.30–5.90)
RDW: 16 % — ABNORMAL HIGH (ref 11.5–14.0)
WBC: 3.6 10*3/uL — ABNORMAL LOW (ref 4.5–11.5)

## 2021-05-05 LAB — BASIC METABOLIC PANEL
ANION GAP: 8 mmol/L (ref 5–19)
BUN/CREA RATIO: 31 — ABNORMAL HIGH (ref 6–20)
BUN: 10 mg/dL (ref 9–20)
CALCIUM: 6.6 mg/dL — ABNORMAL LOW (ref 8.4–10.2)
CHLORIDE: 107 mmol/L (ref 98–107)
CO2 TOTAL: 17 mmol/L — ABNORMAL LOW (ref 22–30)
CREATININE: 0.32 mg/dL — ABNORMAL LOW (ref 0.66–1.20)
ESTIMATED GFR: >60 mL/min/{1.73_m2} (ref >60)
GLUCOSE: 82 mg/dL (ref 74–106)
POTASSIUM: 3.7 mmol/L (ref 3.5–5.1)
SODIUM: 132 mmol/L — ABNORMAL LOW (ref 137–145)

## 2021-05-05 LAB — H & H
HCT: 32.5 % — ABNORMAL LOW (ref 36.0–46.0)
HGB: 11 g/dL — ABNORMAL LOW (ref 13.9–16.3)

## 2021-05-05 LAB — ARTERIAL BLOOD GAS, CO-OX, LYTES, LACTATE REFLEX
%FIO2 (ARTERIAL): 60 %
(T) PCO2: 45 mm/Hg (ref 35.0–45.0)
(T) PO2: 76 mm/Hg (ref 72.0–100.0)
BASE EXCESS (ARTERIAL): 3.3 mmol/L — ABNORMAL HIGH (ref 0.0–1.0)
BICARBONATE (ARTERIAL): 27.4 mmol/L — ABNORMAL HIGH (ref 18.0–26.0)
CARBOXYHEMOGLOBIN: 2.1 % (ref 0.0–2.5)
CHLORIDE: 108 mmol/L (ref 101–111)
GLUCOSE: 97 mg/dL (ref 60–105)
HEMATOCRITRT: 36 %
HEMOGLOBIN: 11.9 g/dL — ABNORMAL LOW (ref 12.0–18.0)
IONIZED CALCIUM: 1.15 mmol/L (ref 1.10–1.35)
LACTATE: 0.5 mmol/L (ref 0.0–1.3)
MET-HEMOGLOBIN: 2.7 % — ABNORMAL HIGH (ref 0.0–2.0)
O2CT: 15.6 % — ABNORMAL LOW (ref 15.7–24.3)
OXYHEMOGLOBIN: 92.6 % (ref 85.0–98.0)
PAO2/FIO2 RATIO: 127 (ref <=200)
PCO2 (ARTERIAL): 45 mm/Hg (ref 35–45)
PH (ARTERIAL): 7.41 (ref 7.35–7.45)
PH (T): 7.41 (ref 7.35–7.45)
PO2 (ARTERIAL): 76 mm/Hg (ref 72–100)
SODIUM: 137 mmol/L (ref 137–145)
TEMPERATURE, COMP: 37 C
WHOLE BLOOD POTASSIUM: 3.5 mmol/L (ref 3.5–4.6)

## 2021-05-05 LAB — RESPIRATORY CULTURE AND GRAM STAIN (PERFORMABLE)

## 2021-05-05 LAB — PHOSPHORUS: PHOSPHORUS: 2.7 mg/dL (ref 2.5–4.5)

## 2021-05-05 LAB — MAGNESIUM: MAGNESIUM: 1.7 mg/dL (ref 1.6–2.3)

## 2021-05-05 MED ORDER — VECURONIUM BROMIDE 10 MG INTRAVENOUS SOLUTION
INTRAVENOUS | Status: AC
Start: 2021-05-05 — End: 2021-05-05
  Administered 2021-05-05: 5 mg via INTRAVENOUS
  Filled 2021-05-05: qty 10

## 2021-05-05 MED ORDER — VECURONIUM BROMIDE 10 MG INTRAVENOUS SOLUTION
5.0000 mg | INTRAVENOUS | Status: AC
Start: 2021-05-05 — End: 2021-05-05

## 2021-05-05 NOTE — Procedures (Signed)
Patient already intubated and maintained on mechanical ventilator support.  Placed on 100% FiO2.  Time out procedure completed per policy.  Bronchoscope introduced through endotracheal tube to level of the Carina.  No endotracheal lesion or abnormalities visualized.  The scope was then introduced into right bronchus intermedius with no evidence of endobronchial lesions.  Mucosa appeared pink, well perfused and normal in appearance.  The RUL, RML and RLL bronchi were visualized and found to be without lesion or obstruction w/ healthy appearing mucosa.  Once into the RLL, 20cc NSS solution was flushed w/ return of clear fluid with occasion white mucus and small tan casts.  The scope was then withdrawn to the level of the carina and introduced into left main bronchus visualizing the LUL, left lingula and LLL, all without endobronchial lesions and slightly redenned  mucosa.  Bronchoscope was withdrawn from the patient and procedure completed.  Tolerated procedure without complications. Sats remains above 95% during procedure.      Charlena Cross, CRNP 05/05/21 12:41

## 2021-05-05 NOTE — Care Plan (Signed)
Patient remains intubated in the ICU on propofol and fentanyl. Will continue to monitor and assess.     Lisette Grinder, RN  05/05/2021, 00:54     Problem: Adult Inpatient Plan of Care  Goal: Plan of Care Review  Outcome: Ongoing (see interventions/notes)  Goal: Patient-Specific Goal (Individualized)  Outcome: Ongoing (see interventions/notes)  Flowsheets (Taken 05/04/2021 2000)  Anxieties, Fears or Concerns: UTA  Goal: Absence of Hospital-Acquired Illness or Injury  Outcome: Ongoing (see interventions/notes)  Intervention: Identify and Manage Fall Risk  Recent Flowsheet Documentation  Taken 05/04/2021 2000 by Lisette Grinder, RN  Safety Promotion/Fall Prevention: safety round/check completed  Intervention: Prevent Skin Injury  Recent Flowsheet Documentation  Taken 05/04/2021 2000 by Lisette Grinder, RN  Body Position: turned q 2 hours  Intervention: Prevent and Manage VTE (Venous Thromboembolism) Risk  Recent Flowsheet Documentation  Taken 05/04/2021 2000 by Lisette Grinder, RN  VTE Prevention/Management: sequential compression devices on  Goal: Optimal Comfort and Wellbeing  Outcome: Ongoing (see interventions/notes)  Intervention: Provide Person-Centered Care  Recent Flowsheet Documentation  Taken 05/04/2021 2000 by Lisette Grinder, RN  Trust Relationship/Rapport:   care explained   choices provided  Goal: Rounds/Family Conference  Outcome: Ongoing (see interventions/notes)     Problem: Fall Injury Risk  Goal: Absence of Fall and Fall-Related Injury  Outcome: Ongoing (see interventions/notes)  Intervention: Identify and Manage Contributors  Recent Flowsheet Documentation  Taken 05/04/2021 2000 by Lisette Grinder, RN  Self-Care Promotion: independence encouraged  Intervention: Promote Injury-Free Environment  Recent Flowsheet Documentation  Taken 05/04/2021 2000 by Lisette Grinder, RN  Safety Promotion/Fall Prevention: safety round/check completed     Problem: Pain Acute  Goal: Acceptable Pain Control and Functional  Ability  Outcome: Ongoing (see interventions/notes)  Intervention: Optimize Psychosocial Wellbeing  Recent Flowsheet Documentation  Taken 05/04/2021 2000 by Lisette Grinder, RN  Supportive Measures: relaxation techniques promoted     Problem: Communication Impairment (Mechanical Ventilation, Invasive)  Goal: Effective Communication  Outcome: Ongoing (see interventions/notes)     Problem: Device-Related Complication Risk (Mechanical Ventilation, Invasive)  Goal: Optimal Device Function  Outcome: Ongoing (see interventions/notes)     Problem: Inability to Wean (Mechanical Ventilation, Invasive)  Goal: Mechanical Ventilation Liberation  Outcome: Ongoing (see interventions/notes)     Problem: Nutrition Impairment (Mechanical Ventilation, Invasive)  Goal: Optimal Nutrition Delivery  Outcome: Ongoing (see interventions/notes)     Problem: Skin and Tissue Injury (Mechanical Ventilation, Invasive)  Goal: Absence of Device-Related Skin and Tissue Injury  Outcome: Ongoing (see interventions/notes)     Problem: Ventilator-Induced Lung Injury (Mechanical Ventilation, Invasive)  Goal: Absence of Ventilator-Induced Lung Injury  Outcome: Ongoing (see interventions/notes)  Intervention: Prevent Ventilator-Associated Pneumonia  Recent Flowsheet Documentation  Taken 05/04/2021 2000 by Lisette Grinder, RN  Oral Care:   oral care provided   lip lubricant applied  Head of Bed (HOB) Positioning: 30 degrees     Problem: Non-violent/Non-Self Destructive Restraints  Goal: Alternative methods tried prior to restraints  Outcome: Ongoing (see interventions/notes)  Goal: Patient free from injury and discomfort  Outcome: Ongoing (see interventions/notes)  Goal: Autonomy maintained at the highest possible level  Outcome: Ongoing (see interventions/notes)  Goal: Need for restraints reassessed per policy  Outcome: Ongoing (see interventions/notes)  Goal: Patient education provided  Outcome: Ongoing (see interventions/notes)  Goal: Problem  Interventions  Outcome: Ongoing (see interventions/notes)     Problem: Bleeding (Orthopaedic Fracture)  Goal: Absence of Bleeding  Outcome: Ongoing (see interventions/notes)     Problem: Embolism (Orthopaedic Fracture)  Goal: Absence of Embolism Signs  and Symptoms  Outcome: Ongoing (see interventions/notes)  Intervention: Prevent or Manage Embolism Risk  Recent Flowsheet Documentation  Taken 05/04/2021 2000 by Lisette Grinder, RN  VTE Prevention/Management: sequential compression devices on     Problem: Fracture Stabilization and Management (Orthopaedic Fracture)  Goal: Fracture Stability  Outcome: Ongoing (see interventions/notes)     Problem: Functional Ability Impaired (Orthopaedic Fracture)  Goal: Optimal Functional Ability  Outcome: Ongoing (see interventions/notes)  Intervention: Optimize Functional Ability  Recent Flowsheet Documentation  Taken 05/04/2021 2000 by Lisette Grinder, RN  Self-Care Promotion: independence encouraged  Activity Management: bedrest  Positioning/Transfer Devices: pillows  Range of Motion: ROM (range of motion) performed     Problem: Infection (Orthopaedic Fracture)  Goal: Absence of Infection Signs and Symptoms  Outcome: Ongoing (see interventions/notes)  Intervention: Prevent or Manage Infection  Recent Flowsheet Documentation  Taken 05/04/2021 2000 by Lisette Grinder, RN  Fever Reduction/Comfort Measures:   lightweight bedding   lightweight clothing   room temperature adjusted     Problem: Neurovascular Compromise (Orthopaedic Fracture)  Goal: Effective Tissue Perfusion  Outcome: Ongoing (see interventions/notes)     Problem: Pain (Orthopaedic Fracture)  Goal: Acceptable Pain Control  Outcome: Ongoing (see interventions/notes)     Problem: Respiratory Compromise (Orthopaedic Fracture)  Goal: Effective Oxygenation and Ventilation  Outcome: Ongoing (see interventions/notes)  Intervention: Promote Airway Secretion Clearance  Recent Flowsheet Documentation  Taken 05/04/2021 2000 by  Lisette Grinder, RN  Cough And Deep Breathing: unable to perform     Problem: ARDS (Acute Respiratory Distress Syndrome)  Goal: Effective Oxygenation  Outcome: Ongoing (see interventions/notes)     Problem: Adjustment to Injury (Multiple Trauma)  Goal: Optimal Coping with Effects of Injury  Outcome: Ongoing (see interventions/notes)  Intervention: Support Adjustment to Injury  Recent Flowsheet Documentation  Taken 05/04/2021 2000 by Lisette Grinder, RN  Supportive Measures: relaxation techniques promoted     Problem: Bleeding (Multiple Trauma)  Goal: Absence of Bleeding  Outcome: Ongoing (see interventions/notes)     Problem: Cerebral Tissue Perfusion Risk (Multiple Trauma)  Goal: Effective Cerebral Tissue Perfusion  Outcome: Ongoing (see interventions/notes)     Problem: Fluid Imbalance (Multiple Trauma)  Goal: Fluid Balance  Outcome: Ongoing (see interventions/notes)     Problem: Functional Ability Impaired (Multiple Trauma)  Goal: Optimal Functional Ability  Outcome: Ongoing (see interventions/notes)  Intervention: Optimize Functional Ability  Recent Flowsheet Documentation  Taken 05/04/2021 2000 by Lisette Grinder, RN  Self-Care Promotion: independence encouraged  Activity Management: bedrest  Positioning/Transfer Devices: pillows  Range of Motion: ROM (range of motion) performed     Problem: Infection (Multiple Trauma)  Goal: Absence of Infection Signs and Symptoms  Outcome: Ongoing (see interventions/notes)  Intervention: Prevent or Manage Infection  Recent Flowsheet Documentation  Taken 05/04/2021 2000 by Lisette Grinder, RN  Fever Reduction/Comfort Measures:   lightweight bedding   lightweight clothing   room temperature adjusted     Problem: Neurovascular Compromise (Multiple Trauma)  Goal: Effective Peripheral Tissue Perfusion  Outcome: Ongoing (see interventions/notes)     Problem: Pain (Multiple Trauma)  Goal: Acceptable Pain Control  Outcome: Ongoing (see interventions/notes)     Problem: Respiratory  Compromise (Multiple Trauma)  Goal: Effective Oxygenation and Ventilation  Outcome: Ongoing (see interventions/notes)  Intervention: Promote Airway Secretion Clearance  Recent Flowsheet Documentation  Taken 05/04/2021 2000 by Lisette Grinder, RN  Cough And Deep Breathing: unable to perform  Activity Management: bedrest  Intervention: Optimize Oxygenation and Ventilation  Recent Flowsheet Documentation  Taken 05/04/2021 2000 by Lisette Grinder, RN  Head  of Bed (HOB) Positioning: 30 degrees     Problem: Fluid Imbalance (Pneumonia)  Goal: Fluid Balance  Outcome: Ongoing (see interventions/notes)     Problem: Infection (Pneumonia)  Goal: Resolution of Infection Signs and Symptoms  Outcome: Ongoing (see interventions/notes)  Intervention: Prevent Infection Progression  Recent Flowsheet Documentation  Taken 05/04/2021 2000 by Lisette Grinder, RN  Fever Reduction/Comfort Measures:   lightweight bedding   lightweight clothing   room temperature adjusted     Problem: Respiratory Compromise (Pneumonia)  Goal: Effective Oxygenation and Ventilation  Outcome: Ongoing (see interventions/notes)  Intervention: Promote Airway Secretion Clearance  Recent Flowsheet Documentation  Taken 05/04/2021 2000 by Lisette Grinder, RN  Cough And Deep Breathing: unable to perform  Intervention: Optimize Oxygenation and Ventilation  Recent Flowsheet Documentation  Taken 05/04/2021 2000 by Lisette Grinder, RN  Head of Bed [facility]) Positioning: 30 degrees     Problem: Skin Injury Risk Increased  Goal: Skin Health and Integrity  Outcome: Ongoing (see interventions/notes)  Intervention: Optimize Skin Protection  Recent Flowsheet Documentation  Taken 05/04/2021 2000 by Lisette Grinder, RN  Pressure Reduction Techniques:   (L,M,H,VH) Turn Schedule - Minimum every 2 hours   (L,M,H,VH)Frequent Turning  Pressure Reduction Devices:   (L.M,H,VH) Heel offloading device utilized   (L.M,H,VH) Pressure redistributing mattress utilized  Skin Protection: adhesive use  limited  Head of Bed (HOB) Positioning: 30 degrees     Problem: Alcohol Withdrawal  Goal: Alcohol Withdrawal Symptom Control  Outcome: Ongoing (see interventions/notes)     Problem: Acute Neurologic Deterioration (Alcohol Withdrawal)  Goal: Optimal Neurologic Function  Outcome: Ongoing (see interventions/notes)     Problem: Substance Misuse (Alcohol Withdrawal)  Goal: Readiness for Change Identified  Outcome: Ongoing (see interventions/notes)  Intervention: Partner to Facilitate Behavior Change  Recent Flowsheet Documentation  Taken 05/04/2021 2000 by Lisette Grinder, RN  Supportive Measures: relaxation techniques promoted

## 2021-05-05 NOTE — Progress Notes (Addendum)
[facility]    ICU Progress Note    Patient Name: Hector [name]  Date: 05/05/2021   Department:  [place]  MRN: [id number]  DOB: 10-Oct-1961    Active Hospital Problems    Diagnosis   . On mechanically assisted ventilation (CMS HCC)   . Multiple rib fractures   . Left pulmonary contusion   . 1Spleen injury   . Closed fracture of transverse process of thoracic vertebra (CMS HCC)   . 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 Thrombocytopenia (CMS HCC)   . Alcohol abuse         HPI:  Hector Ice Smithis a 60 y.o.,malewith past medical history of alcoholism and hypertension who presented as a level 2 trauma s/p falling down approximately 11 stairs while intoxicated. He initially refused medical treatment but presented to the ED on 5/18 after awaking later that morning with severe shortness of breath and posterior chest wall pain. He was noted to have bilateral shoulder and left posterior scalp ecchymosis, left forearm skin tear, and a torn right great toe torn toenail. CTA C/A/P revealed a small left pneumothorax <5%. Atelectatasis bilaterally Rt>Lt. Trace left pleural effusion. Multiple displaced and comminuted left rib fractures. SubQ air left chest wall extending into the left flank. Grade 3 Splenic Trauma. Cirrhosis with evidence of portal hypertension. CT thoracic spine showed multiple rib fractures (medial left 4th- 6th and sixth ribs and posterior lateral left 4th-7th . Possible non-displaced left transverse process fractures of T5 and T6. He was admitted to [facility] under the Trauma Service.Communication with Vascular Surgery, although not a formal consult, regarding concern for splenic laceration bleeding. Close monitoring of H+H's.   On 5/19, on assessment he was noted to be lethargic with increased work of breathing, accessory muscle use, and gurgling respirations. There was concern he was unable to clear his secretions. He was transferred to ICU for intubation. On arrival to ICU,  he was lethargic but oriented and answered questions appropriately. On 100% NRB with increased work of breathing and subsequently intubated for airway protection. Hypertension post intubation that improved with sedation.   Per significant other, [name], the patient drinks approximately15 beers and 2 [employer]/ daily but occasionally more. He is functioning in the morning and is able to complete tasks around the house but does not drive or leave the house. He begins drinking in the afternoon and is unable to stand/ walk by night.    5/20: Remains critically ill in ICU on mechanical ventilation. No acute events overnight. Repeat CT C/A/P showed resolution of left pneumothorax, worsening bilateral lower lobe consolidation, decreasing subQ air in chest wall surrounding left sided rib fractures, and a stable splenic laceration.Tube feeding started.    5/21: No acute events overnight, remains critically ill intubated and sedated on the vent. Currently on a Phenobarb taper for ETOH withdrawal and tolerating well. Sputum culture + today for Streptococcus P and Unasyn dosing increased to 3G for more complete coverage.     5/22: No acute events overnight, remains sedated on mechanical ventilation. Per trauma team notes, they are ok with increasing TF to goal but want to continue to hold off on starting pharm DVT prophylaxis with drop in Hgb, high risk for potential bleed from the splenic laceration. Issues with patient biting down on ETT and bite block in place.       PMHx:   Past Medical History:   Diagnosis Date   . Alcohol abuse    .  Cirrhosis (CMS [place])    . Delirium, withdrawal, alcoholic (CMS HCC)    . HTN (hypertension)    . Unknown cause of injury     fx nose   . Wears glasses        PSHx:  Past Surgical History:   Procedure Laterality Date   . HX TONSILLECTOMY             FHx:  Family History   Problem Relation Name Age of Onset   . Asthma Father          SHx:  Social History     Socioeconomic History   . Marital  status: Divorced   Tobacco Use   . Smoking status: Current Every Day Smoker     Packs/day: 1.50   . Smokeless tobacco: Never Used   Vaping Use   . Vaping Use: Never used   Substance and Sexual Activity   . Alcohol use: Yes     Alcohol/week: 15.0 standard drinks     Types: 15 Cans of beer per week     Comment: daily   . Drug use: Yes     Types: Marijuana     Comment: daily   . Sexual activity: Yes        ROS:  Unable to assess ROS due to patient being intubated and sedated on the vent.     Imaging:   XR AP MOBILE CHEST   Final Result   As above         Radiologist location ID: [id number]         XR AP MOBILE CHEST   Final Result   No significant change.         Radiologist location ID: [id number]         CT CHEST ABDOMEN PELVIS W IV CONTRAST   Final Result      RESPIRATORY MOTION DEGRADED EXAM.      INTERVAL RESOLUTION OF THE PREVIOUSLY SEEN SMALL LEFT-SIDED PNEUMOTHORAX.      WORSENING BILATERAL LOWER LOBE CONSOLIDATION THAT COULD REFLECT ATELECTASIS AND/OR PNEUMONIA      REDEMONSTRATION OF MULTIPLE DISPLACED LEFT-SIDED RIB FRACTURES WITH DECREASING SUBCUTANEOUS AIR IN THE CHEST WALL.      THE KNOWN GRADE 3 SPLENIC LACERATION IS NOT AS WELL VISUALIZED ON THE CURRENT EXAM AND IS STABLE TO IMPROVED FROM THE 05/01/2021 EXAM. NO NEW ACUTE FINDINGS IN THE ABDOMEN/PELVIS.         One or more dose reduction techniques were used (e.g., Automated exposure control, adjustment of the mA and/or kV according to patient size, use of iterative reconstruction technique).         Radiologist location ID: [id number]         XR AP MOBILE CHEST   Final Result   NO SIGNIFICANT CHANGE SINCE THE PRIOR EXAM.         Radiologist location ID: [id number]         XR AP MOBILE CHEST   Final Result   No interval change from 05/02/2021 in the lung fields.         Radiologist location ID: [id number]         XR AP MOBILE CHEST   Final Result   Stable examination compared to 05/02/2021.         Radiologist location ID: [id number]         XR AP  MOBILE CHEST   Final Result   1.Decreased left basilar atelectasis or pneumonia. Right basilar atelectasis or  pneumonia is unchanged.   2.Left rib fractures without pneumothorax.         Radiologist location ID: [id number]         CT LUMBAR SPINE WO IV CONTRAST   Final Result   NO ACUTE LUMBAR FRACTURE.  DEGENERATIVE CHANGES.         One or more dose reduction techniques were used (e.g., Automated exposure control, adjustment of the mA and/or kV according to patient size, use of iterative reconstruction technique).         Radiologist location ID: [id number]         CT THORACIC SPINE WO IV CONTRAST   Final Result   1.Multiple medial and posterior left rib fractures as described. There are possible transverse process fractures at the T5 and T6 levels on the left as well   2.Small medial left pneumothorax. There is soft tissue air on the left. There is a left basal infiltrate and small left effusion         One or more dose reduction techniques were used (e.g., Automated exposure control, adjustment of the mA and/or kV according to patient size, use of iterative reconstruction technique).         Radiologist location ID: [id number]         CT CERVICAL SPINE WO IV CONTRAST   Final Result   NO ACUTE CERVICAL FRACTURE.  DEGENERATIVE CHANGES.         One or more dose reduction techniques were used (e.g., Automated exposure control, adjustment of the mA and/or kV according to patient size, use of iterative reconstruction technique).         Radiologist location ID: [id number]         TRAUMA CTA CHEST W CT ABDOMEN PELVIS W IV CONTRAST   Final Result   Addendum 1 of 1   ADDENDUM:      A Critical Document Only message has been documented for [place] ANDERSON in    the [employer] - [employer] Actionable Findings system on 05/01/2021    10:19 AM, Message ID [id number].         Radiologist location ID: [id number]         Final   Grade 3 Splenic Trauma, per the American Association for the Surgery of Trauma (AAST) injury  grading system, as described above.      Cirrhosis with evidence of portal hypertension      Small left pneumothorax measuring less than 5%      Atelectatic changes at the lung bases with greater the right with a trace left effusion.      Multiple displaced and comminuted left rib fractures.   Subcutaneous air left chest wall extending into the left flank.         One or more dose reduction techniques were used (e.g., Automated exposure control, adjustment of the mA and/or kV according to patient size, use of iterative reconstruction technique).         Radiologist location ID: [id number]         CT BRAIN WO IV CONTRAST   Final Result   No evidence of acute intracranial injury or change.         One or more dose reduction techniques were used (e.g., Automated exposure control, adjustment of the mA and/or kV according to patient size, use of iterative reconstruction technique).         Radiologist location ID: [id number]         XR AP MOBILE CHEST  Final Result   Bilateral left greater than right acute on chronic infiltrates suggested with possible left effusion. Recommend follow-up.         Radiologist location ID: [id number]:  Results for orders placed or performed during the hospital encounter of 05/01/21 (from the past 24 hour(s))   BASIC METABOLIC PANEL   Result Value Ref Range    SODIUM 132 (L) 137 - 145 mmol/L    POTASSIUM 3.7 3.5 - 5.1 mmol/L    CHLORIDE 107 98 - 107 mmol/L    CO2 TOTAL 17 (L) 22 - 30 mmol/L    ANION GAP 8 5 - 19 mmol/L    CALCIUM 6.6 (L) 8.4 - 10.2 mg/dL    GLUCOSE 82 74 - 106 mg/dL    BUN 10 9 - 20 mg/dL    CREATININE 0.32 (L) 0.66 - 1.20 mg/dL    BUN/CREA RATIO 31 (H) 6 - 20    ESTIMATED GFR >60 >60 mL/min/1.33m2   PHOSPHORUS   Result Value Ref Range    PHOSPHORUS 2.7 2.5 - 4.5 mg/dL   MAGNESIUM   Result Value Ref Range    MAGNESIUM 1.7 1.6 - 2.3 mg/dL   CBC   Result Value Ref Range    WBC 3.6 (L) 4.5 - 11.5 x10^3/uL    RBC 2.84 (L) 4.30 - 5.90 x10^6/uL    HGB  10.3 (L) 13.9 - 16.3 g/dL    HCT 29.1 (L) 36.0 - 46.0 %    MCV 102.6 (H) 80.0 - 100.0 fL    MCH 36.1 (H) 25.4 - 34.0 pg    MCHC 35.2 30.0 - 37.0 g/dL    RDW 16.0 (H) 11.5 - 14.0 %    PLATELETS 61 (L) 130 - 400 x10^3/uL    MPV 9.4 7.5 - 11.5 fL   ARTERIAL BLOOD GAS, CO-OX, LYTES, LACTATE REFLEX   Result Value Ref Range    %FIO2 (ARTERIAL) 60 %    PH (ARTERIAL) 7.41 7.35 - 7.45    PCO2 (ARTERIAL) 45 35 - 45 mm/Hg    PO2 (ARTERIAL) 76 72 - 100 mm/Hg    BASE EXCESS (ARTERIAL) 3.3 (H) 0.0 - 1.0 mmol/L    BICARBONATE (ARTERIAL) 27.4 (H) 18.0 - 26.0 mmol/L    TEMPERATURE, COMP 37.0   C    PH (T) 7.41 7.35 - 7.45    (T) PCO2 45.0 35.0 - 45.0 mm/Hg    (T) PO2 76.0 72.0 - 100.0 mm/Hg    SODIUM 137 137 - 145 mmol/L    WHOLE BLOOD POTASSIUM 3.5 3.5 - 4.6 mmol/L    CHLORIDE 108 101 - 111 mmol/L    IONIZED CALCIUM 1.15 1.10 - 1.35 mmol/L    GLUCOSE 97 60 - 105 mg/dL    LACTATE 0.5 0.0 - 1.3 mmol/L    HEMOGLOBIN 11.9 (L) 12.0 - 18.0 g/dL    OXYHEMOGLOBIN 92.6 85.0 - 98.0 %    CARBOXYHEMOGLOBIN 2.1 0.0 - 2.5 %    MET-HEMOGLOBIN 2.7 (H) 0.0 - 2.0 %    O2CT 15.6 (L) 15.7 - 24.3 %    PAO2/FIO2 RATIO 127 <=200    HEMATOCRITRT 36 %       Micro:   Hospital Encounter on 05/01/21 (from the past 96 hour(s))   RESPIRATORY CULTURE AND GRAM STAIN, AEROBIC    Collection Time: 05/02/21  9:45 AM    Specimen: Sputum    Narrative  The following orders were created for panel order RESPIRATORY CULTURE AND GRAM STAIN, AEROBIC.  Procedure                               Abnormality         Status                     ---------                               -----------         ------                     SPUTUM CNOBSJ[[id number]]                                    Final result                 Please view results for these tests on the individual orders.   RESPIRATORY CULTURE AND GRAM STAIN (PERFORMABLE)    Collection Time: 05/02/21  9:45 AM    Specimen: Sputum   Culture Result Status    RESPIRATORY CULTURE Moderate Growth Streptococcus pneumoniae (A) Final     RESPIRATORY CULTURE Light Growth Normal Respiratory Flora Final    GRAM STAIN 1+ Rare Epithelial Cells Final    GRAM STAIN 2+ Few WBCs Final    GRAM STAIN 2+ Few Gram Positive Rods Final    GRAM STAIN 3+ Several Gram Positive Coccobacilli Final    GRAM STAIN 2+ Few Gram Positive Cocci in Pairs Final    GRAM STAIN 2+ Few Gram Positive Cocci/Clusters Final       Susceptibility    Streptococcus pneumoniae -  (no method available)     Azithromycin >2 Resistant      Cefepime <=0.25 Sensitive      Cefotaxime <=0.25 Sensitive      Ceftriaxone <=0.25 Sensitive      Cefuroxime <=0.25 Sensitive      Chloramphenicol 4 Sensitive      Clindamycin >0.5 Resistant      Levofloxacin 0.5 Sensitive      Meropenem <=0.06 Sensitive      Penicillin 0.06 Sensitive      Tetracycline >4 Resistant      Trimethoprim/Sulfamethoxazole 1/19 Intermediate    ADULT ROUTINE BLOOD CULTURE, SET OF 2 ADULT BOTTLES (BACTERIA AND YEAST)    Collection Time: 05/02/21 10:05 AM    Specimen: Blood   Culture Result Status    BLOOD CULTURE, ROUTINE No Growth 2 Days Preliminary   ADULT ROUTINE BLOOD CULTURE, SET OF 2 ADULT BOTTLES (BACTERIA AND YEAST)    Collection Time: 05/02/21 10:17 AM    Specimen: Blood   Culture Result Status    BLOOD CULTURE, ROUTINE No Growth 2 Days Preliminary   MRSA SCREEN, PCR, RAPID    Collection Time: 05/02/21 11:02 AM    Specimen: Nasal Swab   Culture Result Status    MRSA COLONIZATION SCREEN Negative Final   LEGIONELLA URINE ANTIGEN    Collection Time: 05/02/21  3:05 PM    Specimen: Urine, Site not specified   Culture Result Status    LEGIONELLA ANTIGEN Negative Final   STREPTOCOCCUS PNEUMONIAE ANTIGEN,URINE    Collection Time: 05/02/21  3:05 PM  Specimen: Urine, Site not specified   Culture Result Status    S.PNEUMONIA ANTIGEN Negative Final       Vitals:   Vitals:    05/05/21 1130 05/05/21 1150 05/05/21 1200 05/05/21 1230   BP: (!) 145/78  113/60 (!) 155/73   Pulse: 71  68 83   Resp: (!) 22  (!) 22 (!) 22   Temp:  37.2 C  (99 F)     SpO2: 96%  95% 95%   Weight:       Height:       BMI:                Vent Settings:  Mode: APV (CMV)  Set Rate: 22 Breaths Per Minute  Set PEEP: 5 cmH2O  FiO2: 60 %  I:E Ratio: 1:2     PHYSICAL EXAM:  General: Intubated and sedated on the vent. Resting quietly at this time.  Eyes:PERRLA, 31m brisk. Protectives intact, unable to assess EOM's due sedation.  HENT: Ears and nose appear normal.  Mucous membranes moist.ETT intact, no breakdown noted. OGT present.   Neck:supple, symmetrical, trachea midline.  Lungs:Breath sounds with bilateral rhonchi. Maintained on mechanical ventilation. Respirations non-labored  Cardiovascular:Sinus rhythm, HR 80's.Pulses 2+. Capillary refill brisk. No edema.   Abdomen:Soft, round, non-tender on palpation, BS normoactive. TF infusing via OGT  Extremities:Spontaneously moves all extremities.  Skin:Warm, dry, and intact. Red, moist/fungal rashnoted to bilateral groin  Neurologic:Lethargic but oriented and followed commands prior to intubation. Now remains sedated with RASS -2. PERRL, protectives intact. Arouses to verbal stimulation and makes eye contact. Does not follow commands on sedated  Psychiatric:Unable to assess due to patient being sedated.     ASSESSMENT AND PLAN     TXxavier Noonis a 60y.o., White male  admitted to ICU for   Active Hospital Problems    Diagnosis   . Primary Problem: Spleen injury   . On mechanically assisted ventilation (CMS HCC)   . Multiple rib fractures   . Left pulmonary contusion   . Closed fracture of transverse process of thoracic vertebra (CMS HCC)   . 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 Thrombocytopenia (CMS HCC)   . Alcohol abuse      NEURO:  - Hx alcoholism  - S/p trauma with fall down 11 stairs while intoxicated ->CIWA Q4H with PRN Ativan   - CTH: No acute intracranial findings. Left posterior occipital scalp hematoma  - CT C-spine and lumbar spine negative   - CT thoracic spine: Multiple rib fractures  (medial left 4th- 6th and sixth ribs and posterior lateral left 4th-7th . Possible non-displaced left transverse process fractures of T5 and T6   - Sedatedwith Propofol and maintained on mechanical ventilation  - Analgesic: Fentanyl infusion, Lidoderm patch   - Continue MV, thiamine-> Phenobarbital taper starter 5/19  - 5/22 one time dose of Vecuronium for Bronch procedure - Remains on Fentanyl and Propofol gtts.  - Daily spontaneous awakening trials  - Routine neurological exams    RESP:  - Acute respiratory failure with hypoxia ->transferred to ICU on 5/19 for intubation   -Maintained on mechanical ventilation; titration/optimization of ventilator setting for oxygenation/ventilation w/ daily assessment for SBT's as per protocol  - CTA chest: small left pneumothorax <5%. Atelectatasis bilaterally Rt>Lt. Trace left pleural effusion. Multiple displaced and comminuted left rib fractures. SubQ air left chest wall extending into the left flank  - Repeat CT 5/20:resolution of left pneumothorax, worsening bilateral  lower lobe consolidation, decreasing subQ air in chest wall surrounding left sided rib fractures  - Possible simple pna  -> Unasyn and Azithro day #3 - sputum with Strep and Unasyn dosing increased to 3 gms.  - CT with possible small bilat LL collapse/plugging -  05/05/21 bronch with multiple small casts from BLL - repeat CXR pending. No cultures obtained at that time.  - Pulmonary hygiene  - DuoNebs every 4 hours as needed  - Serial CXRs and ABGs    CVS:  - Hx HTN  - Hypertensive ->resolved while sedated  - Routine hemodynamic monitoring and continuous telemetry     GI:  - CTA abdomen/ pelvis: Grade 3 splenic injury. Cirrhosis with evidence of portal hypertension  - Repeat CT A/P on 5/20: Stable splenic laceration.  - Splenic laceration -> continue serial H/H's to monitor for bleeding that could require embolization  - NPO  -Tube feeding as tolerated- 5/22 increased to 40 and dietary  consulted.  -Prophylaxis: IV Pepcid  - Bowel regimen: Colace and Senna    Renal:  - MIVF LR _0   - Foley catheter for strict I/O's  - Monitor and correct electrolyte imbalance as needed  - Serial BMPs      Intake/Output Summary (Last 24 hours) at 05/05/2021 1252  Last data filed at 05/05/2021 1200  Gross per 24 hour   Intake 1254.4 ml   Output 1040 ml   Net 214.4 ml     Net IO Since Admission: 6,505.36 mL [05/05/21 1252]    HEME:  - No active bleeding noted; H/H stable   - Thrombocytopenia, platelets56->69 and continue to trend  - Concern for splenic laceration bleeding that could require embolization-> continue serial H/H Q8H  - No anticoagulation at this time ->SCD's for prophylaxis   - 5/22 slight drop in H/H and repeat this afternoon.   - Serial CBC differential    ID:  - [id number]  - Possible pneumonia -> Unasyn and Azithro day #3 (Unasyn dose increased 5/21)  - Urine strep pneumo and legionellanegative  - Procalcitonin0.10  -Blood cultures NGTD  - UA with no signs of infective source   - Sputum: few GPR, several GPC in pairs and clusters; culture pending    ENDO:  - TSH4.13and HA1C5.3  - POCT glucometer Q 6 hours, add SSI as needed for optimal glycemic control and will continue adjust as necessary to achieve optimal BS levels.      Prophylaxis:  - SCDs, Pepcid, Peridex, HOB > 30, pulmonary hygiene (not yet cleared for pharm DVT by the trauma service.      Code Status:  Full Code     I have personally examined the patient at the bedside; reviewed the medical chart, laboratory data, radiologic imaging and discussed patient's case at bedside with multidisciplinary critical care team. Critical Care excludes any bedside procedures or teaching. Dr. Maurine Minister available for collaboration at all times.     Critical care time excluding billable procedure is 37 minutes.     Charlena Cross, CRNP  05/05/2021, 12:52     ICU round  1. THE PATIENT WAS SEEN AND EXAMINED WITH THE NURSE PRACTITIONER ON CRITICAL  CARE ROUNDS.  2.  WE DISCUSSED THE PATIENT'S DIAGNOSTIC TESTS AND MEDICATIONS.  3. WE DISCUSSED THE PLAN.  4.  I WAS AVAILABLE TO CONSULT WITH ON ANY PROBLEMS.  Waynard Reeds, MD  Waynard Reeds, MD  05/05/2021, 20:51

## 2021-05-05 NOTE — Care Plan (Signed)
Patient remains in ICU on vent sedated on fentanyl and propofol. Bedside broncospy performed at bedside. Patient remains on abx for PNA.  Problem: Adult Inpatient Plan of Care  Goal: Plan of Care Review  Outcome: Ongoing (see interventions/notes)  Goal: Patient-Specific Goal (Individualized)  Outcome: Ongoing (see interventions/notes)  Flowsheets (Taken 05/05/2021 0800)  Anxieties, Fears or Concerns: UTD  Goal: Absence of Hospital-Acquired Illness or Injury  Outcome: Ongoing (see interventions/notes)  Intervention: Identify and Manage Fall Risk  Recent Flowsheet Documentation  Taken 05/05/2021 0800 by Sloan Leiter, RN  Safety Promotion/Fall Prevention: activity supervised  Intervention: Prevent Skin Injury  Recent Flowsheet Documentation  Taken 05/05/2021 0800 by Sloan Leiter, RN  Body Position: turned q 2 hours  Intervention: Prevent and Manage VTE (Venous Thromboembolism) Risk  Recent Flowsheet Documentation  Taken 05/05/2021 0800 by Sloan Leiter, RN  VTE Prevention/Management: sequential compression devices on  Goal: Optimal Comfort and Wellbeing  Outcome: Ongoing (see interventions/notes)  Intervention: Provide Person-Centered Care  Recent Flowsheet Documentation  Taken 05/05/2021 0800 by Sloan Leiter, RN  Trust Relationship/Rapport:   care explained   choices provided   reassurance provided   thoughts/feelings acknowledged   questions encouraged   questions answered   empathic listening provided   emotional support provided  Goal: Rounds/Family Conference  Outcome: Ongoing (see interventions/notes)     Problem: Fall Injury Risk  Goal: Absence of Fall and Fall-Related Injury  Outcome: Ongoing (see interventions/notes)  Intervention: Promote Injury-Free Environment  Recent Flowsheet Documentation  Taken 05/05/2021 0800 by Sloan Leiter, RN  Safety Promotion/Fall Prevention: activity supervised     Problem: Pain Acute  Goal: Acceptable Pain Control and Functional Ability  Outcome: Ongoing (see  interventions/notes)     Problem: Communication Impairment (Mechanical Ventilation, Invasive)  Goal: Effective Communication  Outcome: Ongoing (see interventions/notes)     Problem: Device-Related Complication Risk (Mechanical Ventilation, Invasive)  Goal: Optimal Device Function  Outcome: Ongoing (see interventions/notes)     Problem: Inability to Wean (Mechanical Ventilation, Invasive)  Goal: Mechanical Ventilation Liberation  Outcome: Ongoing (see interventions/notes)     Problem: Nutrition Impairment (Mechanical Ventilation, Invasive)  Goal: Optimal Nutrition Delivery  Outcome: Ongoing (see interventions/notes)     Problem: Skin and Tissue Injury (Mechanical Ventilation, Invasive)  Goal: Absence of Device-Related Skin and Tissue Injury  Outcome: Ongoing (see interventions/notes)     Problem: Ventilator-Induced Lung Injury (Mechanical Ventilation, Invasive)  Goal: Absence of Ventilator-Induced Lung Injury  Outcome: Ongoing (see interventions/notes)  Intervention: Prevent Ventilator-Associated Pneumonia  Recent Flowsheet Documentation  Taken 05/05/2021 1605 by Sloan Leiter, RN  VAP Prevention Bundle:   HOB elevation maintained   oral care with chlorhexidine  VAP Prevention Measures: completed  Taken 05/05/2021 1150 by Sloan Leiter, RN  VAP Prevention Bundle:   HOB elevation maintained   oral care with chlorhexidine  VAP Prevention Measures: completed  Taken 05/05/2021 0800 by Sloan Leiter, RN  VAP Prevention Bundle:   HOB elevation maintained   oral care with chlorhexidine  Oral Care: oral care provided  Head of Bed (HOB) Positioning: HOB at 30-45 degrees  VAP Prevention Contraindications: VTE prophylaxis contraindicated (describe)  VAP Prevention Measures: completed     Problem: Non-violent/Non-Self Destructive Restraints  Goal: Alternative methods tried prior to restraints  Outcome: Ongoing (see interventions/notes)  Goal: Patient free from injury and discomfort  Outcome: Ongoing (see  interventions/notes)  Goal: Autonomy maintained at the highest possible level  Outcome: Ongoing (see interventions/notes)  Goal: Need for restraints reassessed per policy  Outcome: Ongoing (see interventions/notes)  Goal: Patient education provided  Outcome: Ongoing (see interventions/notes)  Goal: Problem Interventions  Outcome: Ongoing (see interventions/notes)     Problem: Bleeding (Orthopaedic Fracture)  Goal: Absence of Bleeding  Outcome: Ongoing (see interventions/notes)     Problem: Embolism (Orthopaedic Fracture)  Goal: Absence of Embolism Signs and Symptoms  Outcome: Ongoing (see interventions/notes)  Intervention: Prevent or Manage Embolism Risk  Recent Flowsheet Documentation  Taken 05/05/2021 0800 by Sloan Leiter, RN  VTE Prevention/Management: sequential compression devices on     Problem: Fracture Stabilization and Management (Orthopaedic Fracture)  Goal: Fracture Stability  Outcome: Ongoing (see interventions/notes)     Problem: Functional Ability Impaired (Orthopaedic Fracture)  Goal: Optimal Functional Ability  Outcome: Ongoing (see interventions/notes)  Intervention: Optimize Functional Ability  Recent Flowsheet Documentation  Taken 05/05/2021 0800 by Sloan Leiter, RN  Activity Management: bedrest  Positioning/Transfer Devices:   in use   pillows  Range of Motion: active ROM (range of motion) encouraged     Problem: Infection (Orthopaedic Fracture)  Goal: Absence of Infection Signs and Symptoms  Outcome: Ongoing (see interventions/notes)  Intervention: Prevent or Manage Infection  Recent Flowsheet Documentation  Taken 05/05/2021 0800 by Sloan Leiter, RN  Fever Reduction/Comfort Measures:   lightweight bedding   lightweight clothing     Problem: Neurovascular Compromise (Orthopaedic Fracture)  Goal: Effective Tissue Perfusion  Outcome: Ongoing (see interventions/notes)     Problem: Pain (Orthopaedic Fracture)  Goal: Acceptable Pain Control  Outcome: Ongoing (see interventions/notes)      Problem: Respiratory Compromise (Orthopaedic Fracture)  Goal: Effective Oxygenation and Ventilation  Outcome: Ongoing (see interventions/notes)  Intervention: Promote Airway Secretion Clearance  Recent Flowsheet Documentation  Taken 05/05/2021 1605 by Sloan Leiter, RN  Cough And Deep Breathing: unable to perform  Taken 05/05/2021 1150 by Sloan Leiter, RN  Cough And Deep Breathing: unable to perform  Taken 05/05/2021 0800 by Sloan Leiter, RN  Cough And Deep Breathing: unable to perform     Problem: ARDS (Acute Respiratory Distress Syndrome)  Goal: Effective Oxygenation  Outcome: Ongoing (see interventions/notes)     Problem: Adjustment to Injury (Multiple Trauma)  Goal: Optimal Coping with Effects of Injury  Outcome: Ongoing (see interventions/notes)     Problem: Bleeding (Multiple Trauma)  Goal: Absence of Bleeding  Outcome: Ongoing (see interventions/notes)     Problem: Cerebral Tissue Perfusion Risk (Multiple Trauma)  Goal: Effective Cerebral Tissue Perfusion  Outcome: Ongoing (see interventions/notes)     Problem: Fluid Imbalance (Multiple Trauma)  Goal: Fluid Balance  Outcome: Ongoing (see interventions/notes)     Problem: Functional Ability Impaired (Multiple Trauma)  Goal: Optimal Functional Ability  Outcome: Ongoing (see interventions/notes)  Intervention: Optimize Functional Ability  Recent Flowsheet Documentation  Taken 05/05/2021 0800 by Sloan Leiter, RN  Activity Management: bedrest  Positioning/Transfer Devices:   in use   pillows  Range of Motion: active ROM (range of motion) encouraged     Problem: Infection (Multiple Trauma)  Goal: Absence of Infection Signs and Symptoms  Outcome: Ongoing (see interventions/notes)  Intervention: Prevent or Manage Infection  Recent Flowsheet Documentation  Taken 05/05/2021 0800 by Sloan Leiter, RN  Fever Reduction/Comfort Measures:   lightweight bedding   lightweight clothing     Problem: Neurovascular Compromise (Multiple Trauma)  Goal: Effective  Peripheral Tissue Perfusion  Outcome: Ongoing (see interventions/notes)     Problem: Pain (Multiple Trauma)  Goal: Acceptable Pain Control  Outcome: Ongoing (see interventions/notes)     Problem: Respiratory Compromise (Multiple Trauma)  Goal: Effective Oxygenation and Ventilation  Outcome: Ongoing (  see interventions/notes)  Intervention: Promote Airway Secretion Clearance  Recent Flowsheet Documentation  Taken 05/05/2021 1605 by Sloan Leiter, RN  Cough And Deep Breathing: unable to perform  Taken 05/05/2021 1150 by Sloan Leiter, RN  Cough And Deep Breathing: unable to perform  Taken 05/05/2021 0800 by Sloan Leiter, RN  Cough And Deep Breathing: unable to perform  Activity Management: bedrest  Intervention: Optimize Oxygenation and Ventilation  Recent Flowsheet Documentation  Taken 05/05/2021 0800 by Sloan Leiter, RN  Head of Bed [facility]) Positioning: HOB at 30-45 degrees     Problem: Fluid Imbalance (Pneumonia)  Goal: Fluid Balance  Outcome: Ongoing (see interventions/notes)     Problem: Infection (Pneumonia)  Goal: Resolution of Infection Signs and Symptoms  Outcome: Ongoing (see interventions/notes)  Intervention: Prevent Infection Progression  Recent Flowsheet Documentation  Taken 05/05/2021 0800 by Sloan Leiter, RN  Fever Reduction/Comfort Measures:   lightweight bedding   lightweight clothing     Problem: Respiratory Compromise (Pneumonia)  Goal: Effective Oxygenation and Ventilation  Outcome: Ongoing (see interventions/notes)  Intervention: Promote Airway Secretion Clearance  Recent Flowsheet Documentation  Taken 05/05/2021 1605 by Sloan Leiter, RN  Cough And Deep Breathing: unable to perform  Taken 05/05/2021 1150 by Sloan Leiter, RN  Cough And Deep Breathing: unable to perform  Taken 05/05/2021 0800 by Sloan Leiter, RN  Cough And Deep Breathing: unable to perform  Intervention: Optimize Oxygenation and Ventilation  Recent Flowsheet Documentation  Taken 05/05/2021 0800 by  Sloan Leiter, RN  Head of Bed [facility]) Positioning: HOB at 30-45 degrees     Problem: Skin Injury Risk Increased  Goal: Skin Health and Integrity  Outcome: Ongoing (see interventions/notes)  Intervention: Optimize Skin Protection  Recent Flowsheet Documentation  Taken 05/05/2021 1605 by Sloan Leiter, RN  Pressure Reduction Techniques: (L,M,H,VH) Turn Schedule - Minimum every 2 hours  Pressure Reduction Devices: (M,H,VH) Use Repositioning Devices or Pillows  Taken 05/05/2021 1150 by Sloan Leiter, RN  Pressure Reduction Techniques: (L,M,H,VH) Turn Schedule - Minimum every 2 hours  Pressure Reduction Devices: (M,H,VH) Use Repositioning Devices or Pillows  Taken 05/05/2021 0800 by Sloan Leiter, RN  Pressure Reduction Techniques: (L,M,H,VH) Turn Schedule - Minimum every 2 hours  Pressure Reduction Devices: (M,H,VH) Use Repositioning Devices or Pillows  Skin Protection:   drying agents applied   antimicrobial wipes   adhesive use limited  Head of Bed (HOB) Positioning: HOB at 30-45 degrees     Problem: Alcohol Withdrawal  Goal: Alcohol Withdrawal Symptom Control  Outcome: Ongoing (see interventions/notes)     Problem: Acute Neurologic Deterioration (Alcohol Withdrawal)  Goal: Optimal Neurologic Function  Outcome: Ongoing (see interventions/notes)     Problem: Substance Misuse (Alcohol Withdrawal)  Goal: Readiness for Change Identified  Outcome: Ongoing (see interventions/notes)

## 2021-05-05 NOTE — Progress Notes (Addendum)
Name: Samul Mcinroy MRN:  [id number]   Date of admission: 05/01/2021 Age: 60 y.o.       Name - Anthonny Schiller    Date of service - 05/05/2021      Interval history  No acute events overnight.  Patient remains intubated in the ICU.  Patient has been tolerating tube feedings at 20 cc an hour with no high residual.  He does have green sputum when ET suctioned.  Hemodynamically stable.  Afebrile.  Patient's hemoglobin did trend down to 10 today.    Physical exam  Filed Vitals:    05/05/21 0700 05/05/21 0703 05/05/21 0730 05/05/21 0800   BP: 117/71  125/70 (!) 114/59   Pulse: 77  84 82   Resp: (!) 23  (!) 22 (!) 21   Temp:       SpO2: 94% 95% 93% 94%          Intake/Output Summary (Last 24 hours) at 05/05/2021 0818  Last data filed at 05/05/2021 0700  Gross per 24 hour   Intake 2034 ml   Output 875 ml   Net 1159 ml        General:Intubated in the ICU with sedation  Abdomen: soft, nontender, nondistended,small umbilical hernia  Skin: No jaundice, lesions, or rashes.   Lungs:On the ventilator and lungs are clear this morning  CV: Hemodynamically stable  Extremities: no edema      Labs/Imaging -     XR AP MOBILE CHEST performed on 05/05/2021 6:31 AM    CLINICAL HISTORY: Trauma.  trauma/vent    TECHNIQUE: Frontal view of the chest.    COMPARISON:  Yesterday    FINDINGS:  Support tubes and lines are unchanged.   There is stable mild enlargement of the cardiac silhouette. The pulmonary vasculature is within normal limits.   There are persistent small bilateral pleural effusions with associated bibasilar atelectasis/infiltrate. These findings have improved compared to prior study.         IMPRESSION:  As above      ASSESSMENT & RECOMMENDATIONS:   59 y.o.malestatus post fall downstairs sustaining multiple left-sided rib fractures with subcutaneous emphysema,transverse process fracture of thoracic spine,and grade 3 splenic laceration  - patient to remain intubated in the ICU for today  -Tube feedings can be  advanced to max as tolerated  -antibiotics for the developing pneumonia  -continue close monitoring of his hemoglobin,we will transfuse as needed and likely give platelets if he requires transfusion  -patient is a daily alcoholic with evidence of cirrhosis with portal hypertension and thrombocytopenia.Marland KitchenMarland KitchenIf there is concern about bleeding from his splenic laceration then he will need to be embolized by Interventional Radiology or vascular surgery.Marland KitchenMarland KitchenPatient is an extremely poor surgical candidate  - I have ordered a repeat H&H at 4:00 p.m. today  -can the with daily chest x-rays to monitor the pneumothorax,we will have a low threshold to repeat a chest x-ray if there is any question about increasing FiO2 requirements  - PCP boots for DVT prophylaxis for now, hold off on starting Lovenox because of his anemia and splenic laceration    Clinton Quant, MD      Note: A portion of this documentation was generated using MMODAL (voice recognition software) and may contain syntax/voice recognition errors.

## 2021-05-05 NOTE — Progress Notes (Signed)
Pt screened to see peers due to ETOH use. Patient presented to ED after fall due to intoxication. Pt is still on a mechanical vent and unable to participate in any assessment. [facility] will continue to follow and monitor patient's progress. Follow-up tomorrow to check on pt's condition and if we are able to, complete BI.

## 2021-05-06 ENCOUNTER — Ambulatory Visit (HOSPITAL_COMMUNITY)
Admission: RE | Admit: 2021-05-06 | Discharge: 2021-05-06 | Disposition: A | Discharge status: Home / Self Care | Payer: MEDICAID | Source: Ambulatory Visit

## 2021-05-06 ENCOUNTER — Ambulatory Visit (HOSPITAL_COMMUNITY): Payer: MEDICAID

## 2021-05-06 DIAGNOSIS — R918 Other nonspecific abnormal finding of lung field: Secondary | ICD-10-CM | POA: Insufficient documentation

## 2021-05-06 DIAGNOSIS — Z6832 Body mass index (BMI) 32.0-32.9, adult: Secondary | ICD-10-CM

## 2021-05-06 DIAGNOSIS — F10239 Alcohol dependence with withdrawal, unspecified: Secondary | ICD-10-CM

## 2021-05-06 DIAGNOSIS — J9690 Respiratory failure, unspecified, unspecified whether with hypoxia or hypercapnia: Secondary | ICD-10-CM | POA: Insufficient documentation

## 2021-05-06 DIAGNOSIS — S3600XA Unspecified injury of spleen, initial encounter: Secondary | ICD-10-CM

## 2021-05-06 LAB — BASIC METABOLIC PANEL
ANION GAP: 5 mmol/L (ref 5–19)
ANION GAP: 3 mmol/L — ABNORMAL LOW (ref 5–19)
ANION GAP: 1 mmol/L — ABNORMAL LOW (ref 5–19)
BUN/CREA RATIO: 33 — ABNORMAL HIGH (ref 6–20)
BUN/CREA RATIO: 25 — ABNORMAL HIGH (ref 6–20)
BUN/CREA RATIO: 28 — ABNORMAL HIGH (ref 6–20)
BUN: 10 mg/dL (ref 9–20)
BUN: 12 mg/dL (ref 9–20)
BUN: 13 mg/dL (ref 9–20)
CALCIUM: 6.7 mg/dL — ABNORMAL LOW (ref 8.4–10.2)
CALCIUM: 7.7 mg/dL — ABNORMAL LOW (ref 8.4–10.2)
CALCIUM: 7.7 mg/dL — ABNORMAL LOW (ref 8.4–10.2)
CHLORIDE: 108 mmol/L — ABNORMAL HIGH (ref 98–107)
CHLORIDE: 109 mmol/L — ABNORMAL HIGH (ref 98–107)
CHLORIDE: 110 mmol/L — ABNORMAL HIGH (ref 98–107)
CO2 TOTAL: 18 mmol/L — ABNORMAL LOW (ref 22–30)
CO2 TOTAL: 28 mmol/L (ref 22–30)
CO2 TOTAL: 28 mmol/L (ref 22–30)
CREATININE: 0.3 mg/dL — ABNORMAL LOW (ref 0.66–1.20)
CREATININE: 0.48 mg/dL — ABNORMAL LOW (ref 0.66–1.20)
CREATININE: 0.46 mg/dL — ABNORMAL LOW (ref 0.66–1.20)
ESTIMATED GFR: >60 mL/min/{1.73_m2} (ref >60)
ESTIMATED GFR: >60 mL/min/{1.73_m2} (ref >60)
ESTIMATED GFR: >60 mL/min/{1.73_m2} (ref >60)
GLUCOSE: 87 mg/dL (ref 74–106)
GLUCOSE: 108 mg/dL — ABNORMAL HIGH (ref 74–106)
GLUCOSE: 110 mg/dL — ABNORMAL HIGH (ref 74–106)
POTASSIUM: 3.6 mmol/L (ref 3.5–5.1)
POTASSIUM: 3.6 mmol/L (ref 3.5–5.1)
POTASSIUM: 3.8 mmol/L (ref 3.5–5.1)
SODIUM: 131 mmol/L — ABNORMAL LOW (ref 137–145)
SODIUM: 140 mmol/L (ref 137–145)
SODIUM: 139 mmol/L (ref 137–145)

## 2021-05-06 LAB — PHOSPHORUS
PHOSPHORUS: 3 mg/dL (ref 2.5–4.5)
PHOSPHORUS: 4 mg/dL (ref 2.5–4.5)
PHOSPHORUS: 3.9 mg/dL (ref 2.5–4.5)

## 2021-05-06 LAB — POC BLOOD GLUCOSE (RESULTS)
GLUCOSE, POC: 113 mg/dl — ABNORMAL HIGH (ref 74–106)
GLUCOSE, POC: 100 mg/dl (ref 74–106)

## 2021-05-06 LAB — ARTERIAL BLOOD GAS, CO-OX, LYTES, LACTATE REFLEX
%FIO2 (ARTERIAL): 60 %
(T) PCO2: 48 mm/Hg — ABNORMAL HIGH (ref 35.0–45.0)
(T) PO2: 69 mm/Hg — ABNORMAL LOW (ref 72.0–100.0)
BASE EXCESS (ARTERIAL): 1.8 mmol/L — ABNORMAL HIGH (ref 0.0–1.0)
BICARBONATE (ARTERIAL): 26.2 mmol/L — ABNORMAL HIGH (ref 18.0–26.0)
CARBOXYHEMOGLOBIN: 1.7 % (ref 0.0–2.5)
CHLORIDE: 109 mmol/L (ref 101–111)
GLUCOSE: 104 mg/dL (ref 60–105)
HEMATOCRITRT: 35 %
HEMOGLOBIN: 11.7 g/dL — ABNORMAL LOW (ref 12.0–18.0)
IONIZED CALCIUM: 1.15 mmol/L (ref 1.10–1.35)
LACTATE: 1 mmol/L (ref 0.0–1.3)
MET-HEMOGLOBIN: 1.1 % (ref 0.0–2.0)
O2CT: 15.2 % — ABNORMAL LOW (ref 15.7–24.3)
OXYHEMOGLOBIN: 92.3 % (ref 85.0–98.0)
PAO2/FIO2 RATIO: 115 (ref <=200)
PCO2 (ARTERIAL): 48 mm/Hg — ABNORMAL HIGH (ref 35–45)
PH (ARTERIAL): 7.37 (ref 7.35–7.45)
PH (T): 7.37 (ref 7.35–7.45)
PO2 (ARTERIAL): 69 mm/Hg — ABNORMAL LOW (ref 72–100)
SODIUM: 139 mmol/L (ref 137–145)
TEMPERATURE, COMP: 37 C
WHOLE BLOOD POTASSIUM: 3.8 mmol/L (ref 3.5–4.6)

## 2021-05-06 LAB — MAGNESIUM
MAGNESIUM: 1.6 mg/dL (ref 1.6–2.3)
MAGNESIUM: 2.7 mg/dL — ABNORMAL HIGH (ref 1.6–2.3)
MAGNESIUM: 2.5 mg/dL — ABNORMAL HIGH (ref 1.6–2.3)

## 2021-05-06 LAB — CBC
HCT: 29.1 % — ABNORMAL LOW (ref 36.0–46.0)
HGB: 10.1 g/dL — ABNORMAL LOW (ref 13.9–16.3)
MCH: 35.7 pg — ABNORMAL HIGH (ref 25.4–34.0)
MCHC: 34.6 g/dL (ref 30.0–37.0)
MCV: 103.3 fL — ABNORMAL HIGH (ref 80.0–100.0)
MPV: 9.7 fL (ref 7.5–11.5)
PLATELETS: 66 10*3/uL — ABNORMAL LOW (ref 130–400)
RBC: 2.82 10*6/uL — ABNORMAL LOW (ref 4.30–5.90)
RDW: 16.3 % — ABNORMAL HIGH (ref 11.5–14.0)
WBC: 3 10*3/uL — ABNORMAL LOW (ref 4.5–11.5)

## 2021-05-06 MED ORDER — MAGNESIUM SULFATE 2 GRAM/50 ML IN WATER IVPB PREMIX - Q1H X2 DEFAULT
4.0000 g | INJECTION | Freq: Once | INTRAVENOUS | Status: AC
Start: 2021-05-06 — End: 2021-05-06
  Administered 2021-05-06: 4 g via INTRAVENOUS
  Filled 2021-05-06: qty 100

## 2021-05-06 MED ORDER — ENOXAPARIN 40 MG/0.4 ML SUBCUTANEOUS SYRINGE
40.0000 mg | INJECTION | Freq: Every day | SUBCUTANEOUS | Status: DC
Start: 2021-05-06 — End: 2021-05-30
  Administered 2021-05-06 – 2021-05-29 (×24): 40 mg via SUBCUTANEOUS
  Filled 2021-05-06 (×25): qty 0.4

## 2021-05-06 MED ORDER — POTASSIUM CHLORIDE 20 MEQ/100ML IN STERILE WATER INTRAVENOUS PIGGYBACK
20.0000 meq | INJECTION | Freq: Once | INTRAVENOUS | Status: AC
Start: 2021-05-06 — End: 2021-05-06
  Administered 2021-05-06: 0 meq via INTRAVENOUS
  Administered 2021-05-06: 20 meq via INTRAVENOUS
  Filled 2021-05-06: qty 100

## 2021-05-06 MED ORDER — FOLIC ACID 5 MG/ML INJECTION SOLUTION
1.0000 mg | Freq: Every day | INTRAMUSCULAR | Status: DC
Start: 2021-05-06 — End: 2021-05-30
  Administered 2021-05-06 – 2021-05-24 (×20): 1 mg via INTRAVENOUS
  Administered 2021-05-25: 0 mg via INTRAVENOUS
  Administered 2021-05-26 – 2021-05-29 (×4): 1 mg via INTRAVENOUS
  Filled 2021-05-06 (×31): qty 0.2

## 2021-05-06 NOTE — Care Plan (Signed)
Patient remains intubated in the ICU. He is on fentanyl, propofol and LR. Will continue to monitor and assess.     Lisette Grinder, RN  05/06/2021, 00:51      Problem: Adult Inpatient Plan of Care  Goal: Plan of Care Review  Outcome: Ongoing (see interventions/notes)  Goal: Patient-Specific Goal (Individualized)  Outcome: Ongoing (see interventions/notes)  Flowsheets (Taken 05/05/2021 2000)  Anxieties, Fears or Concerns: UTA  Goal: Absence of Hospital-Acquired Illness or Injury  Outcome: Ongoing (see interventions/notes)  Intervention: Identify and Manage Fall Risk  Recent Flowsheet Documentation  Taken 05/05/2021 2000 by Lisette Grinder, RN  Safety Promotion/Fall Prevention: activity supervised  Intervention: Prevent Skin Injury  Recent Flowsheet Documentation  Taken 05/05/2021 2000 by Lisette Grinder, RN  Body Position: turned q 2 hours  Intervention: Prevent and Manage VTE (Venous Thromboembolism) Risk  Recent Flowsheet Documentation  Taken 05/05/2021 2000 by Lisette Grinder, RN  VTE Prevention/Management: sequential compression devices on  Goal: Optimal Comfort and Wellbeing  Outcome: Ongoing (see interventions/notes)  Intervention: Provide Person-Centered Care  Recent Flowsheet Documentation  Taken 05/05/2021 2000 by Lisette Grinder, RN  Trust Relationship/Rapport:   care explained   emotional support provided  Goal: Rounds/Family Conference  Outcome: Ongoing (see interventions/notes)     Problem: Fall Injury Risk  Goal: Absence of Fall and Fall-Related Injury  Outcome: Ongoing (see interventions/notes)  Intervention: Identify and Manage Contributors  Recent Flowsheet Documentation  Taken 05/05/2021 2000 by Lisette Grinder, RN  Self-Care Promotion: independence encouraged  Intervention: Promote Injury-Free Environment  Recent Flowsheet Documentation  Taken 05/05/2021 2000 by Lisette Grinder, RN  Safety Promotion/Fall Prevention: activity supervised     Problem: Pain Acute  Goal: Acceptable Pain Control and  Functional Ability  Outcome: Ongoing (see interventions/notes)  Intervention: Optimize Psychosocial Wellbeing  Recent Flowsheet Documentation  Taken 05/05/2021 2000 by Lisette Grinder, RN  Supportive Measures: relaxation techniques promoted     Problem: Communication Impairment (Mechanical Ventilation, Invasive)  Goal: Effective Communication  Outcome: Ongoing (see interventions/notes)     Problem: Device-Related Complication Risk (Mechanical Ventilation, Invasive)  Goal: Optimal Device Function  Outcome: Ongoing (see interventions/notes)     Problem: Inability to Wean (Mechanical Ventilation, Invasive)  Goal: Mechanical Ventilation Liberation  Outcome: Ongoing (see interventions/notes)     Problem: Nutrition Impairment (Mechanical Ventilation, Invasive)  Goal: Optimal Nutrition Delivery  Outcome: Ongoing (see interventions/notes)     Problem: Skin and Tissue Injury (Mechanical Ventilation, Invasive)  Goal: Absence of Device-Related Skin and Tissue Injury  Outcome: Ongoing (see interventions/notes)     Problem: Ventilator-Induced Lung Injury (Mechanical Ventilation, Invasive)  Goal: Absence of Ventilator-Induced Lung Injury  Outcome: Ongoing (see interventions/notes)  Intervention: Prevent Ventilator-Associated Pneumonia  Recent Flowsheet Documentation  Taken 05/06/2021 0000 by Lisette Grinder, RN  VAP Prevention Bundle: HOB elevation maintained  VAP Prevention Measures: completed  Taken 05/05/2021 2000 by Lisette Grinder, RN  VAP Prevention Bundle: HOB elevation maintained  Oral Care:   oral care provided   lip lubricant applied   oral care with Chlorhexidine provided   oral swabbing  Head of Bed (HOB) Positioning: HOB at 45 degrees  VAP Prevention Contraindications: VTE prophylaxis contraindicated (describe)  VAP Prevention Measures: completed     Problem: Non-violent/Non-Self Destructive Restraints  Goal: Alternative methods tried prior to restraints  Outcome: Ongoing (see interventions/notes)  Goal: Patient free  from injury and discomfort  Outcome: Ongoing (see interventions/notes)  Goal: Autonomy maintained at the highest possible level  Outcome: Ongoing (see interventions/notes)  Goal: Need for restraints reassessed per policy  Outcome: Ongoing (see interventions/notes)  Goal: Patient education provided  Outcome: Ongoing (see interventions/notes)  Goal: Problem Interventions  Outcome: Ongoing (see interventions/notes)     Problem: Bleeding (Orthopaedic Fracture)  Goal: Absence of Bleeding  Outcome: Ongoing (see interventions/notes)     Problem: Embolism (Orthopaedic Fracture)  Goal: Absence of Embolism Signs and Symptoms  Outcome: Ongoing (see interventions/notes)  Intervention: Prevent or Manage Embolism Risk  Recent Flowsheet Documentation  Taken 05/05/2021 2000 by Lisette Grinder, RN  VTE Prevention/Management: sequential compression devices on     Problem: Fracture Stabilization and Management (Orthopaedic Fracture)  Goal: Fracture Stability  Outcome: Ongoing (see interventions/notes)     Problem: Functional Ability Impaired (Orthopaedic Fracture)  Goal: Optimal Functional Ability  Outcome: Ongoing (see interventions/notes)  Intervention: Optimize Functional Ability  Recent Flowsheet Documentation  Taken 05/05/2021 2000 by Lisette Grinder, RN  Self-Care Promotion: independence encouraged  Activity Management: bedrest  Positioning/Transfer Devices: pillows  Range of Motion: active ROM (range of motion) encouraged     Problem: Infection (Orthopaedic Fracture)  Goal: Absence of Infection Signs and Symptoms  Outcome: Ongoing (see interventions/notes)  Intervention: Prevent or Manage Infection  Recent Flowsheet Documentation  Taken 05/05/2021 2000 by Lisette Grinder, RN  Fever Reduction/Comfort Measures:   lightweight clothing   lightweight bedding     Problem: Neurovascular Compromise (Orthopaedic Fracture)  Goal: Effective Tissue Perfusion  Outcome: Ongoing (see interventions/notes)     Problem: Pain (Orthopaedic  Fracture)  Goal: Acceptable Pain Control  Outcome: Ongoing (see interventions/notes)     Problem: Respiratory Compromise (Orthopaedic Fracture)  Goal: Effective Oxygenation and Ventilation  Outcome: Ongoing (see interventions/notes)  Intervention: Promote Airway Secretion Clearance  Recent Flowsheet Documentation  Taken 05/06/2021 0000 by Lisette Grinder, RN  Cough And Deep Breathing: unable to perform  Taken 05/05/2021 2000 by Lisette Grinder, RN  Cough And Deep Breathing: unable to perform     Problem: ARDS (Acute Respiratory Distress Syndrome)  Goal: Effective Oxygenation  Outcome: Ongoing (see interventions/notes)     Problem: Adjustment to Injury (Multiple Trauma)  Goal: Optimal Coping with Effects of Injury  Outcome: Ongoing (see interventions/notes)  Intervention: Support Adjustment to Injury  Recent Flowsheet Documentation  Taken 05/05/2021 2000 by Lisette Grinder, RN  Supportive Measures: relaxation techniques promoted     Problem: Bleeding (Multiple Trauma)  Goal: Absence of Bleeding  Outcome: Ongoing (see interventions/notes)

## 2021-05-06 NOTE — Progress Notes (Signed)
[facility]    ICU Progress Note    Patient Name: Hector [name]  Date: 05/06/2021   Department:  [facility]  MRN: [id number]  DOB: March 04, 1961    Active Hospital Problems    Diagnosis   . On mechanically assisted ventilation (CMS HCC)   . Multiple rib fractures   . Left pulmonary contusion   . 1Spleen injury   . Closed fracture of transverse process of thoracic vertebra (CMS HCC)   . 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 Thrombocytopenia (CMS HCC)   . Alcohol abuse         HPI: Hector Greenley Smithis a 60 y.o.,malewith past medical history of alcoholism and hypertension who presented as a level 2 trauma s/p falling down approximately 11 stairs while intoxicated. He initially refused medical treatment but presented to the ED on 5/18 after awaking later that morning with severe shortness of breath and posterior chest wall pain. He was noted to have bilateral shoulder and left posterior scalp ecchymosis, left forearm skin tear, and a torn right great toe torn toenail. CTA C/A/P revealed a small left pneumothorax <5%. Atelectatasis bilaterally Rt>Lt. Trace left pleural effusion. Multiple displaced and comminuted left rib fractures. SubQ air left chest wall extending into the left flank. Grade 3 Splenic Trauma. Cirrhosis with evidence of portal hypertension. CT thoracic spine showed multiple rib fractures (medial left 4th- 6th and sixth ribs and posterior lateral left 4th-7th . Possible non-displaced left transverse process fractures of T5 and T6. He was admitted to [facility] under the Trauma Service.Communication with Vascular Surgery, although not a formal consult, regarding concern for splenic laceration bleeding. Close monitoring of H+H's.   On 5/19, on assessment he was noted to be lethargic with increased work of breathing, accessory muscle use, and gurgling respirations. There was concern he was unable to clear his secretions. He was transferred to ICU for intubation. On arrival to ICU,  he was lethargic but oriented and answered questions appropriately. On 100% NRB with increased work of breathing and subsequently intubated for airway protection. Hypertension post intubation that improved with sedation.   Per significant other, [name], the patient drinks approximately15 beers and 2 [employer]/ daily but occasionally more. He is functioning in the morning and is able to complete tasks around the house but does not drive or leave the house. He begins drinking in the afternoon and is unable to stand/ walk by night.    5/20: Remains critically ill in ICU on mechanical ventilation. No acute events overnight. Repeat CT C/A/P showed resolution of left pneumothorax, worsening bilateral lower lobe consolidation, decreasing subQ air in chest wall surrounding left sided rib fractures, and a stable splenic laceration.Tube feeding started.    5/21: No acute events overnight, remains critically ill intubated and sedated on the vent. Currently on a Phenobarb taper for ETOH withdrawal and tolerating well. Sputum culture + today for Streptococcus P and Unasyn dosing increased to 3G for more complete coverage.    5/22: No acute events overnight, remains sedated on mechanical ventilation. Per trauma team notes, they are ok with increasing TF to goal but want to continue to hold off on starting pharm DVT prophylaxis with drop in Hgb, high risk for potential bleed from the splenic laceration. Issues with patient biting down on ETT and bite block in place.     5/23: No acute events overnight, remains sedated on mechanical ventilation.       PMHx:   Past Medical  History:   Diagnosis Date   . Alcohol abuse    . Cirrhosis (CMS [place])    . Delirium, withdrawal, alcoholic (CMS HCC)    . HTN (hypertension)    . Unknown cause of injury     fx nose   . Wears glasses        PSHx:  Past Surgical History:   Procedure Laterality Date   . HX TONSILLECTOMY             FHx:  Family History   Problem Relation Name Age of Onset   .  Asthma Father          SHx:  Social History     Socioeconomic History   . Marital status: Divorced   Tobacco Use   . Smoking status: Current Every Day Smoker     Packs/day: 1.50   . Smokeless tobacco: Never Used   Vaping Use   . Vaping Use: Never used   Substance and Sexual Activity   . Alcohol use: Yes     Alcohol/week: 15.0 standard drinks     Types: 15 Cans of beer per week     Comment: daily   . Drug use: Yes     Types: Marijuana     Comment: daily   . Sexual activity: Yes        ROS:  Review of systems was not obtained due to tto patient being sedated on the ventilator. .    Imaging:   XR AP MOBILE CHEST   Final Result   Persistent perihilar infiltrates/vascular congestion slightly worse on the left since yesterday         Radiologist location ID: [id number]         XR AP MOBILE CHEST   Final Result   1.Bibasilar atelectasis or pneumonia/aspiration   2.No pneumothorax   3.Endotracheal tube terminating 5 cm above the carina         Radiologist location ID: [id number]         XR AP MOBILE CHEST   Final Result   As above         Radiologist location ID: [id number]         XR AP MOBILE CHEST   Final Result   No significant change.         Radiologist location ID: [id number]         CT CHEST ABDOMEN PELVIS W IV CONTRAST   Final Result      RESPIRATORY MOTION DEGRADED EXAM.      INTERVAL RESOLUTION OF THE PREVIOUSLY SEEN SMALL LEFT-SIDED PNEUMOTHORAX.      WORSENING BILATERAL LOWER LOBE CONSOLIDATION THAT COULD REFLECT ATELECTASIS AND/OR PNEUMONIA      REDEMONSTRATION OF MULTIPLE DISPLACED LEFT-SIDED RIB FRACTURES WITH DECREASING SUBCUTANEOUS AIR IN THE CHEST WALL.      THE KNOWN GRADE 3 SPLENIC LACERATION IS NOT AS WELL VISUALIZED ON THE CURRENT EXAM AND IS STABLE TO IMPROVED FROM THE 05/01/2021 EXAM. NO NEW ACUTE FINDINGS IN THE ABDOMEN/PELVIS.         One or more dose reduction techniques were used (e.g., Automated exposure control, adjustment of the mA and/or kV according to patient size, use of iterative  reconstruction technique).         Radiologist location ID: [id number]         XR AP MOBILE CHEST   Final Result   NO SIGNIFICANT CHANGE SINCE THE PRIOR EXAM.         Radiologist location  ID: [id number]         XR AP MOBILE CHEST   Final Result   No interval change from 05/02/2021 in the lung fields.         Radiologist location ID: [id number]         XR AP MOBILE CHEST   Final Result   Stable examination compared to 05/02/2021.         Radiologist location ID: [id number]         XR AP MOBILE CHEST   Final Result   1.Decreased left basilar atelectasis or pneumonia. Right basilar atelectasis or pneumonia is unchanged.   2.Left rib fractures without pneumothorax.         Radiologist location ID: [id number]         CT LUMBAR SPINE WO IV CONTRAST   Final Result   NO ACUTE LUMBAR FRACTURE.  DEGENERATIVE CHANGES.         One or more dose reduction techniques were used (e.g., Automated exposure control, adjustment of the mA and/or kV according to patient size, use of iterative reconstruction technique).         Radiologist location ID: [id number]         CT THORACIC SPINE WO IV CONTRAST   Final Result   1.Multiple medial and posterior left rib fractures as described. There are possible transverse process fractures at the T5 and T6 levels on the left as well   2.Small medial left pneumothorax. There is soft tissue air on the left. There is a left basal infiltrate and small left effusion         One or more dose reduction techniques were used (e.g., Automated exposure control, adjustment of the mA and/or kV according to patient size, use of iterative reconstruction technique).         Radiologist location ID: [id number]         CT CERVICAL SPINE WO IV CONTRAST   Final Result   NO ACUTE CERVICAL FRACTURE.  DEGENERATIVE CHANGES.         One or more dose reduction techniques were used (e.g., Automated exposure control, adjustment of the mA and/or kV according to patient size, use of iterative reconstruction technique).          Radiologist location ID: [id number]         TRAUMA CTA CHEST W CT ABDOMEN PELVIS W IV CONTRAST   Final Result   Addendum 1 of 1   ADDENDUM:      A Critical Document Only message has been documented for [place] ANDERSON in    the [employer] Actionable Findings system on 05/01/2021    10:19 AM, Message ID [id number].         Radiologist location ID: [id number]         Final   Grade 3 Splenic Trauma, per the American Association for the Surgery of Trauma (AAST) injury grading system, as described above.      Cirrhosis with evidence of portal hypertension      Small left pneumothorax measuring less than 5%      Atelectatic changes at the lung bases with greater the right with a trace left effusion.      Multiple displaced and comminuted left rib fractures.   Subcutaneous air left chest wall extending into the left flank.         One or more dose reduction techniques were used (e.g., Automated exposure control, adjustment of the mA and/or kV according to patient  size, use of iterative reconstruction technique).         Radiologist location ID: [id number]         CT BRAIN WO IV CONTRAST   Final Result   No evidence of acute intracranial injury or change.         One or more dose reduction techniques were used (e.g., Automated exposure control, adjustment of the mA and/or kV according to patient size, use of iterative reconstruction technique).         Radiologist location ID: [id number]         XR AP MOBILE CHEST   Final Result   Bilateral left greater than right acute on chronic infiltrates suggested with possible left effusion. Recommend follow-up.         Radiologist location ID: [id number]:  Results for orders placed or performed during the hospital encounter of 05/01/21 (from the past 24 hour(s))   H & H   Result Value Ref Range    HGB 11.0 (L) 13.9 - 16.3 g/dL    HCT 32.5 (L) 36.0 - 46.0 %   BASIC METABOLIC PANEL   Result Value Ref Range    SODIUM 131 (L) 137 - 145  mmol/L    POTASSIUM 3.6 3.5 - 5.1 mmol/L    CHLORIDE 108 (H) 98 - 107 mmol/L    CO2 TOTAL 18 (L) 22 - 30 mmol/L    ANION GAP 5 5 - 19 mmol/L    CALCIUM 6.7 (L) 8.4 - 10.2 mg/dL    GLUCOSE 87 74 - 106 mg/dL    BUN 10 9 - 20 mg/dL    CREATININE 0.30 (L) 0.66 - 1.20 mg/dL    BUN/CREA RATIO 33 (H) 6 - 20    ESTIMATED GFR >60 >60 mL/min/1.33m2   PHOSPHORUS   Result Value Ref Range    PHOSPHORUS 3.0 2.5 - 4.5 mg/dL   MAGNESIUM   Result Value Ref Range    MAGNESIUM 1.6 1.6 - 2.3 mg/dL   CBC   Result Value Ref Range    WBC 3.0 (L) 4.5 - 11.5 x10^3/uL    RBC 2.82 (L) 4.30 - 5.90 x10^6/uL    HGB 10.1 (L) 13.9 - 16.3 g/dL    HCT 29.1 (L) 36.0 - 46.0 %    MCV 103.3 (H) 80.0 - 100.0 fL    MCH 35.7 (H) 25.4 - 34.0 pg    MCHC 34.6 30.0 - 37.0 g/dL    RDW 16.3 (H) 11.5 - 14.0 %    PLATELETS 66 (L) 130 - 400 x10^3/uL    MPV 9.7 7.5 - 11.5 fL   ARTERIAL BLOOD GAS, CO-OX, LYTES, LACTATE REFLEX   Result Value Ref Range    %FIO2 (ARTERIAL) 60 %    PH (ARTERIAL) 7.37 7.35 - 7.45    PCO2 (ARTERIAL) 48 (H) 35 - 45 mm/Hg    PO2 (ARTERIAL) 69 (L) 72 - 100 mm/Hg    BASE EXCESS (ARTERIAL) 1.8 (H) 0.0 - 1.0 mmol/L    BICARBONATE (ARTERIAL) 26.2 (H) 18.0 - 26.0 mmol/L    TEMPERATURE, COMP 37.0   C    PH (T) 7.37 7.35 - 7.45    (T) PCO2 48.0 (H) 35.0 - 45.0 mm/Hg    (T) PO2 69.0 (L) 72.0 - 100.0 mm/Hg    SODIUM 139 137 - 145 mmol/L    WHOLE BLOOD POTASSIUM 3.8 3.5 - 4.6 mmol/L  CHLORIDE 109 101 - 111 mmol/L    IONIZED CALCIUM 1.15 1.10 - 1.35 mmol/L    GLUCOSE 104 60 - 105 mg/dL    LACTATE 1.0 0.0 - 1.3 mmol/L    HEMOGLOBIN 11.7 (L) 12.0 - 18.0 g/dL    OXYHEMOGLOBIN 92.3 85.0 - 98.0 %    CARBOXYHEMOGLOBIN 1.7 0.0 - 2.5 %    MET-HEMOGLOBIN 1.1 0.0 - 2.0 %    O2CT 15.2 (L) 15.7 - 24.3 %    PAO2/FIO2 RATIO 115 <=200    HEMATOCRITRT 35 %   POC BLOOD GLUCOSE (RESULTS)   Result Value Ref Range    GLUCOSE, POC 113 (H) 74 - 106 mg/dl       Micro:   Hospital Encounter on 05/01/21 (from the past 96 hour(s))   LEGIONELLA URINE ANTIGEN    Collection Time:  05/02/21  3:05 PM    Specimen: Urine, Site not specified   Culture Result Status    LEGIONELLA ANTIGEN Negative Final   STREPTOCOCCUS PNEUMONIAE ANTIGEN,URINE    Collection Time: 05/02/21  3:05 PM    Specimen: Urine, Site not specified   Culture Result Status    S.PNEUMONIA ANTIGEN Negative Final       Vitals:   Vitals:    05/06/21 1000 05/06/21 1100 05/06/21 1115 05/06/21 1200   BP: 109/60 (!) 106/59  (!) 103/59   Pulse: 80 80  79   Resp: (!) 22 (!) 23  (!) 21   Temp:  37.2 C (98.9 F) 37.1 C (98.8 F)    SpO2: 96% 96% 97% 97%   Weight:       Height:       BMI:                Vent Settings:  Mode: APV (CMV)  Set Rate: 22 Breaths Per Minute  Set PEEP: 5 cmH2O  FiO2: 60 %  I:E Ratio: 1:2     PHYSICAL EXAM:  General:Intubated and sedated on the vent. Resting quietly at this time.  Eyes:PERRLA, 15m brisk. Protectives intact, unable to assess EOM's due sedation.  HENT:Ears and nose appear normal. Mucous membranes moist.ETTintact, no breakdown noted. OGT present.  Neck:supple, symmetrical, trachea midline.  Lungs:Breath sounds with bilateral rhonchi. Maintained on mechanical ventilation. Respirations non-labored  Cardiovascular:Sinus rhythm, HR 70's.Pulses 2+. Capillary refill brisk. No edema.  Abdomen:Soft, round, non-tender on palpation, BS normoactive. TF infusing via OGT  Extremities:Spontaneously moves all extremities.  Skin:Warm, dry, and intact. Red, moist/fungal rashnoted to bilateral groin  Neurologic:Lethargic but oriented and followed commands prior to intubation. Now remains sedated with RASS -2. PERRL, protectives intact. Arouses to verbal stimulation and makes eye contact. Does not follow commands on sedated  Psychiatric:Unable to assessdue to patient being sedated.     ASSESSMENT AND PLAN     TDawn Kiperis a 60y.o., White male  admitted to ICU for   Active Hospital Problems    Diagnosis   . Primary Problem: Spleen injury   . On mechanically assisted ventilation (CMS HCC)   .  Multiple rib fractures   . Left pulmonary contusion   . Closed fracture of transverse process of thoracic vertebra (CMS HCC)   . 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 Thrombocytopenia (CMS HCC)   . Alcohol abuse      NEURO:  - Hx alcoholism  - S/p trauma with fall down 11 stairs while intoxicated ->CIWA Q4H with PRN Ativan   - CTH: No acute intracranial  findings. Left posterior occipital scalp hematoma  - CT C-spine and lumbar spine negative   - CT thoracic spine: Multiple rib fractures (medial left 4th- 6th and sixth ribs and posterior lateral left 4th-7th . Possible non-displaced left transverse process fractures of T5 and T6   - Sedatedwith Propofol and maintained on mechanical ventilation  - Analgesic: Fentanyl infusion, Lidoderm patch   - Continue MV, thiamine-> Phenobarbital taper starter 5/19  - 5/22 one time dose of Vecuronium for Bronch procedure - Remains on Fentanyl and Propofol gtts.  - Phenobarb taper for ETOH withdrawal.  - Daily spontaneous awakening trials as appropriate  - Routine neurological exams per ICU protocol.    RESP:  - Acute respiratory failure with hypoxia ->transferred to ICU on 5/19 for intubation   -Maintained on mechanical ventilation; titration/optimization of ventilator setting for oxygenation/ventilation w/ daily assessment for SBT's as per protocol  - CTA chest: small left pneumothorax <5%. Atelectatasis bilaterally Rt>Lt. Trace left pleural effusion. Multiple displaced and comminuted left rib fractures. SubQ air left chest wall extending into the left flank  - Repeat CT 5/20:resolution of left pneumothorax, worsening bilateral lower lobe consolidation, decreasing subQ air in chest wall surrounding left sided rib fractures  - Possiblesimple pna->Unasyn and Azithro day #4 - sputum with Strep and Unasyn dosing increased to 3 gms.  - CT with possible small bilat LL collapse/plugging -  05/05/21 bronch with multiple small casts from BLL - repeat CXR with no  change. No cultures obtained at that time.  - Pulmonary hygiene  - DuoNebs every 4 hours as needed  - Serial CXRs and ABGs    CVS:  - Hx HTN  - Hypertensive ->resolved while sedated  - Routine hemodynamic monitoring and continuous telemetry     GI:  - CTA abdomen/ pelvis: Grade 3 splenic injury. Cirrhosis with evidence of portal hypertension  - Repeat CT A/Pon5/20: Stable splenic laceration.  - Splenic laceration -> continue serial H/H's to monitor for bleeding that could require embolization  - NPO  -Tube feeding as tolerated- 5/22 increased to 40 and dietary consulted for recommendations and goals.  -Prophylaxis: IV Pepcid  - Bowel regimen: Colace and Senna    Renal:  - MIVF LR @100   - Foley catheter for strict I/O's  - Monitor and correct electrolyte imbalance as needed  - Serial BMPs       Intake/Output Summary (Last 24 hours) at 05/06/2021 1220  Last data filed at 05/06/2021 1200  Gross per 24 hour   Intake 2490 ml   Output 1580 ml   Net 910 ml     Net IO Since Admission: 7,415.36 mL [05/06/21 1220]    HEME:  - No active bleeding noted; H/H stable   - Thrombocytopenia, platelets56->69 andcontinue to trend  - Concern for splenic laceration bleeding that could require embolization-> continue serial H/H Q8H  - No anticoagulation at this time ->SCD's for prophylaxis   - Serial CBC differential    ID:  - [id number]  - Pneumonia -> Unasyn and Azithro day #3 (Unasyn dose increased 5/21)  - Sputum culture growing Haemophilis and Kelbsiella as of 5/23 and continued on above abx.  - Urine strep pneumo and legionellanegative  - Procalcitonin0.10  -Blood cultures NGTD  - UAwith no signs of infective source  - Sputum: few GPR, several GPC in pairs and clusters; culture pending    ENDO:  - TSH4.13and HA1C5.3  - POCT glucometer Q 6 hours, add SSI as needed for optimal glycemic controland  will continue adjust as necessary to achieve optimal BS levels.    Prophylaxis:  -SCDs, Pepcid, Peridex,HOB >  30, pulmonary hygiene(not yet cleared for pharm DVT by the trauma service.    Code Status:  Full Code     I have personally examined the patient at the bedside; reviewed the medical chart, laboratory data, radiologic imaging and discussed patient's case at bedside with multidisciplinary critical care team. Critical Care excludes any bedside procedures or teaching. Dr. Rosanna Randy available for collaboration at all times.     Critical care time excluding billable procedure is 39  minutes.     Charlena Cross, CRNP  05/06/2021, 12:20

## 2021-05-06 NOTE — OT Evaluation (Signed)
[place]  Occupational Therapy Initial Evaluation    Patient Name: Hector [name]  Date of Birth: 24-Oct-1961  Height: Height: 182.9 cm (6')  Weight: Weight: 104 kg (229 lb 4.5 oz)  Room/Bed: 367/A  Payor: [place] / Plan: [place] / Product Type: Medicaid [place] /     Assessment:       05/06/21 0815   Therapist Pager   OT Assigned/ Pager # Landry Corporal, OTR/L [phone]   Rehab Session   Document Type evaluation   Total OT Minutes: 10   General Information   Patient Profile Reviewed yes   Onset of Illness/Injury or Date of Surgery 05/02/21   General Observations of Patient Sedated, intubated/vent.  Did not arouse or follow commands this morning.   Pertinent History of Current Functional Problem 60 year old gentleman who was intoxicated yesterday evening and fell down approximately 11 stairs around 230 this morning but refused medical treatment at that time...  When he awoke in this morning he was complaining of severe shortness of breath and pain in his chest wall posteriorly   Medical Lines Foley Catheter;PIV Line;OG Tube;PICC Line   Respiratory Status ventilator   Existing Precautions/Restrictions bedrest;NPO   Living Environment   Lives With other (see comments)  (PLOF unknown; patient unable to provide information.)   Functional Level Prior   Ambulation other (see comments)  (PLOF unknown; patient unable to provide information.)   RUE Assessment   RUE Assessment WFL- Within Functional Limits  (PROM)   LUE Assessment   LUE Assessment WFL- Within Functional Limits   Bed Mobility Assessment/Treatment   Comment Dependent   Transfer Assessment/Treatment   Transfer Comment Dependent.   Bathing Assessment/Training   Independence Level dependent (less than 25% patient effort)   Upper Body Dressing Assessment/Training   Independence Level  dependent (less than 25% patient effort)   Lower Body Dressing Assessment/Training   Independence Level  dependent (less than 25%  patient effort)   Toileting Assessment/Training   Independence Level  dependent (less than 25% patient effort)   Grooming Assessment/Training   Independence Level dependent (less than 25% patient effort)   Self-Feeding Assessment/Training   Comment NPO   Care Plan Goals   OT Rehab Goals Occupational Therapy Goal;Bed Mobility Goal;Grooming Goal;Transfer Training Goal   Occupational Therapy Goals   OT Goal, Date Established 05/06/21   OT Goal, Time to Achieve by discharge   OT Goal, Activity Type BUE kinetics 0-2 lb w/ fair tolerance for improved ADL performance.   Bed Mobility Goal   Bed Mobility Goal, Date Established 05/06/21   Bed Mobility Goal, Time to Achieve by discharge   Bed Mobility Goal, Independence Level minimum assist (75% patient effort);2 person assist required   Grooming Goal   Grooming Goal, Date Established 05/06/21   Grooming Goal, Time to Achieve by discharge   Grooming Goal, Independence  minimum assist (75% patient effort)   Transfer Training Goal   Transfer Training Goal, Date Established 05/06/21   Transfer Training Goal, Time to Achieve by discharge   Transfer Training Goal, Independence Level minimum assist (75% patient effort);2 person assist required   Planned Therapy Interventions, OT Eval   Planned Therapy Interventions ADL retraining;bed mobility training;cognitive retraining;therapeutic exercise;transfer training   Functional Impairment   Overall Functional Impairments/Problem List strength decreased   Clinical Impression   OT Diagnosis muscle weakness   Criteria for Skilled Therapeutic Interventions Met (OT) yes;meets criteria;skilled treatment is necessary   Rehab Potential good,  to achieve stated therapy goals   Therapy Frequency minimum of 2x/week   Predicted Duration of Therapy until discharge   Anticipated Equipment Needs at Discharge to be determined   Anticipated Discharge Disposition long term acute care facility   Daily Activity AM-PAC/6-clicks Score   Putting on/Taking off  clothing on lower body 1   Bathing 1   Toileting 1   Putting on/Taking off clothing on upper body 1   Personal grooming 1   Eating Meals 1   Raw Score Total 6   Standardized (t-scale) Score 17.07   CMS 0-100% Score 100   CMS Modifier CN   Evaluation Complexity Justification   Occupational Profile Review Brief history   Performance Deficits 3-5 deficits   Clinical Decision Making Moderate analytic complexity   Evaluation Complexity Moderate     Past Medical History:   Diagnosis Date   . Alcohol abuse    . Cirrhosis (CMS [place] [place])    . Delirium, withdrawal, alcoholic (CMS [facility])    . HTN (hypertension)    . Unknown cause of injury     fx nose   . Wears glasses      Past Surgical History:   Procedure Laterality Date   . HX TONSILLECTOMY             Discharge Needs:   Equipment Recommendation: (P) to be determined    The patient presents with self care/ mobility limitations due to impaired strength that significantly impair/prevent patient's ability to participate in mobility-related activities of daily living (MRADLs) including  ambulation and transfers in order to safely complete, toileting, bathing. This functional mobility deficit can be sufficiently resolved with the use of a (P) to be determined in order to decrease the risk of falls, morbidity, and mortality in performance of these MRADLs.     Discharge Disposition: (P) long term acute care facility    JUSTIFICATION OF DISCHARGE RECOMMENDATION   Based on current diagnosis, functional performance prior to admission, and current functional performance, this patient requires continued OT services in (P) long term acute care facility  in order to achieve significant functional improvements.    Plan:   Current Intervention: (P) ADL retraining, bed mobility training, cognitive retraining, therapeutic exercise, transfer training    To provide Occupational therapy services (P) minimum of 2x/week, (P) until discharge.       The risks/benefits of therapy have been discussed with  the patient/caregiver and he/she is in agreement with the established plan of care.       Subjective & Objective     OT initial evaluation x10 minutes; OT educated patient on role of OT.    Therapist:   Landry Corporal, OT     Start Time: [phone]  End Time: 0825  Evaluation Time: 10 minutes  Charges Entered: evalx1  Department Number: [phone]

## 2021-05-06 NOTE — Care Plan (Signed)
Patient remains sedated on the ventilator. VSS. Care plan reviewed with family.      Problem: Adult Inpatient Plan of Care  Goal: Plan of Care Review  Outcome: Ongoing (see interventions/notes)  Goal: Patient-Specific Goal (Individualized)  Outcome: Ongoing (see interventions/notes)  Goal: Absence of Hospital-Acquired Illness or Injury  Outcome: Ongoing (see interventions/notes)  Intervention: Identify and Manage Fall Risk  Recent Flowsheet Documentation  Taken 05/06/2021 0700 by Toni Arthurs, RN  Safety Promotion/Fall Prevention: activity supervised  Intervention: Prevent Skin Injury  Recent Flowsheet Documentation  Taken 05/06/2021 0700 by Toni Arthurs, RN  Body Position:   turned q 2 hours   supine, head elevated  Intervention: Prevent and Manage VTE (Venous Thromboembolism) Risk  Recent Flowsheet Documentation  Taken 05/06/2021 0700 by Toni Arthurs, RN  VTE Prevention/Management:   sequential compression devices on   anticoagulant therapy initiated  Goal: Optimal Comfort and Wellbeing  Outcome: Ongoing (see interventions/notes)  Goal: Rounds/Family Conference  Outcome: Ongoing (see interventions/notes)     Problem: Fall Injury Risk  Goal: Absence of Fall and Fall-Related Injury  Outcome: Ongoing (see interventions/notes)  Intervention: Identify and Manage Contributors  Recent Flowsheet Documentation  Taken 05/06/2021 0700 by Toni Arthurs, RN  Self-Care Promotion: independence encouraged  Intervention: Promote Injury-Free Environment  Recent Flowsheet Documentation  Taken 05/06/2021 0700 by Toni Arthurs, RN  Safety Promotion/Fall Prevention: activity supervised     Problem: Pain Acute  Goal: Acceptable Pain Control and Functional Ability  Outcome: Ongoing (see interventions/notes)     Problem: Communication Impairment (Mechanical Ventilation, Invasive)  Goal: Effective Communication  Outcome: Ongoing (see interventions/notes)     Problem: Device-Related Complication Risk (Mechanical Ventilation,  Invasive)  Goal: Optimal Device Function  Outcome: Ongoing (see interventions/notes)     Problem: Inability to Wean (Mechanical Ventilation, Invasive)  Goal: Mechanical Ventilation Liberation  Outcome: Ongoing (see interventions/notes)     Problem: Nutrition Impairment (Mechanical Ventilation, Invasive)  Goal: Optimal Nutrition Delivery  Outcome: Ongoing (see interventions/notes)     Problem: Skin and Tissue Injury (Mechanical Ventilation, Invasive)  Goal: Absence of Device-Related Skin and Tissue Injury  Outcome: Ongoing (see interventions/notes)     Problem: Ventilator-Induced Lung Injury (Mechanical Ventilation, Invasive)  Goal: Absence of Ventilator-Induced Lung Injury  Outcome: Ongoing (see interventions/notes)  Intervention: Prevent Ventilator-Associated Pneumonia  Recent Flowsheet Documentation  Taken 05/06/2021 0700 by Toni Arthurs, RN  VAP Prevention Bundle:   HOB elevation maintained   oral care regularly provided   sedation interruption performed   stress ulcer prophylaxis provided   vent circuit breaks minimized   VTE prophylaxis provided  Oral Care: oral care provided  Head of Bed (HOB) Positioning: 30 degrees  VAP Prevention Contraindications: VTE prophylaxis contraindicated (describe)  VAP Prevention Measures: completed     Problem: Non-violent/Non-Self Destructive Restraints  Goal: Alternative methods tried prior to restraints  Outcome: Ongoing (see interventions/notes)  Goal: Patient free from injury and discomfort  Outcome: Ongoing (see interventions/notes)  Goal: Autonomy maintained at the highest possible level  Outcome: Ongoing (see interventions/notes)

## 2021-05-06 NOTE — Care Plan (Signed)
Hector [name] remains in ICU intubated and sedated on the ventilator. Hector [name] is on CIWA protocol for alcohol withdrawal as well as tapered phenobarbitol. Hector [name] is on IV antibiotics for pneumonia. Hector [name] is on tube feeds to maintain nutrition while intubated. RN to continue to monitor and maintain plan of care.       Problem: Adult Inpatient Plan of Care  Goal: Plan of Care Review  Outcome: Ongoing (see interventions/notes)  Flowsheets (Taken 05/06/2021 2000)  Plan of Care Reviewed With: Hector [name]  Goal: Hector [name]-Specific Goal (Individualized)  Outcome: Ongoing (see interventions/notes)  Flowsheets (Taken 05/06/2021 2000)  Individualized Care Needs: monitor signs and symptoms of alcohol withdrawal  Anxieties, Fears or Concerns:   uta   intubated and sedated  Hector [name]-Specific Goals (Include Timeframe): Hector [name] will have no signs or symptoms of DTs prior to discharge  Goal: Absence of Hospital-Acquired Illness or Injury  Outcome: Ongoing (see interventions/notes)  Intervention: Identify and Manage Fall Risk  Recent Flowsheet Documentation  Taken 05/06/2021 2000 by Tonita Cong, RN  Safety Promotion/Fall Prevention:   fall prevention program maintained   activity supervised   muscle strengthening facilitated   nonskid shoes/slippers when out of bed   safety round/check completed   toileting scheduled  Intervention: Prevent Skin Injury  Recent Flowsheet Documentation  Taken 05/06/2021 2200 by Tonita Cong, RN  Body Position:   side lying, right   legs elevated  Taken 05/06/2021 2000 by Tonita Cong, RN  Body Position:   supine   legs elevated  Intervention: Prevent and Manage VTE (Venous Thromboembolism) Risk  Recent Flowsheet Documentation  Taken 05/06/2021 2000 by Tonita Cong, RN  VTE Prevention/Management: sequential compression devices on  Intervention: Prevent Infection  Recent Flowsheet Documentation  Taken 05/06/2021 2000 by Tonita Cong, RN  Infection Prevention:   barrier precautions utilized   environmental surveillance  performed  Goal: Optimal Comfort and Wellbeing  Outcome: Ongoing (see interventions/notes)  Intervention: Provide Person-Centered Care  Recent Flowsheet Documentation  Taken 05/06/2021 2000 by Tonita Cong, RN  Trust Relationship/Rapport:   care explained   reassurance provided  Goal: Rounds/Family Conference  Outcome: Ongoing (see interventions/notes)     Problem: Fall Injury Risk  Goal: Absence of Fall and Fall-Related Injury  Outcome: Ongoing (see interventions/notes)  Intervention: Identify and Manage Contributors  Recent Flowsheet Documentation  Taken 05/06/2021 2000 by Tonita Cong, RN  Self-Care Promotion: BADL personal routines maintained  Intervention: Promote Injury-Free Environment  Recent Flowsheet Documentation  Taken 05/06/2021 2000 by Tonita Cong, RN  Safety Promotion/Fall Prevention:   fall prevention program maintained   activity supervised   muscle strengthening facilitated   nonskid shoes/slippers when out of bed   safety round/check completed   toileting scheduled     Problem: Pain Acute  Goal: Acceptable Pain Control and Functional Ability  Outcome: Ongoing (see interventions/notes)  Intervention: Optimize Psychosocial Wellbeing  Recent Flowsheet Documentation  Taken 05/06/2021 2000 by Tonita Cong, RN  Supportive Measures:   positive reinforcement provided   relaxation techniques promoted     Problem: Communication Impairment (Mechanical Ventilation, Invasive)  Goal: Effective Communication  Outcome: Ongoing (see interventions/notes)     Problem: Device-Related Complication Risk (Mechanical Ventilation, Invasive)  Goal: Optimal Device Function  Outcome: Ongoing (see interventions/notes)  Intervention: Optimize Device Care and Function  Recent Flowsheet Documentation  Taken 05/06/2021 2000 by Tonita Cong, RN  Airway Safety Measures:   manual resuscitator/mask/valve in room   suction at bedside     Problem: Inability to Wean (Mechanical Ventilation, Invasive)  Goal: Mechanical Ventilation  Liberation  Outcome: Ongoing (see interventions/notes)     Problem: Nutrition Impairment (Mechanical Ventilation, Invasive)  Goal: Optimal Nutrition Delivery  Outcome: Ongoing (see interventions/notes)     Problem: Skin and Tissue Injury (Mechanical Ventilation, Invasive)  Goal: Absence of Device-Related Skin and Tissue Injury  Outcome: Ongoing (see interventions/notes)     Problem: Ventilator-Induced Lung Injury (Mechanical Ventilation, Invasive)  Goal: Absence of Ventilator-Induced Lung Injury  Outcome: Ongoing (see interventions/notes)  Intervention: Prevent Ventilator-Associated Pneumonia  Recent Flowsheet Documentation  Taken 05/06/2021 2200 by Tonita Cong, RN  Oral Care:   lip lubricant applied   oral care with Chlorhexidine provided  Head of Bed [facility]) Positioning: HOB at 30-45 degrees  Taken 05/06/2021 2000 by Tonita Cong, RN  VAP Prevention Bundle:   HOB elevation maintained   oral care regularly provided   oral care with chlorhexidine   stress ulcer prophylaxis provided  Oral Care: oral care with Chlorhexidine provided  Head of Bed (HOB) Positioning: HOB at 30-45 degrees  VAP Prevention Contraindications: (low platelets)   VTE prophylaxis contraindicated (describe)   other (see comments)  VAP Prevention Measures: completed     Problem: Non-violent/Non-Self Destructive Restraints  Goal: Alternative methods tried prior to restraints  Outcome: Ongoing (see interventions/notes)  Goal: Hector [name] free from injury and discomfort  Outcome: Ongoing (see interventions/notes)  Goal: Autonomy maintained at the highest possible level  Outcome: Ongoing (see interventions/notes)  Goal: Need for restraints reassessed per policy  Outcome: Ongoing (see interventions/notes)  Goal: Hector [name] education provided  Outcome: Ongoing (see interventions/notes)  Goal: Problem Interventions  Outcome: Ongoing (see interventions/notes)     Problem: Bleeding (Orthopaedic Fracture)  Goal: Absence of Bleeding  Outcome: Ongoing (see  interventions/notes)     Problem: Embolism (Orthopaedic Fracture)  Goal: Absence of Embolism Signs and Symptoms  Outcome: Ongoing (see interventions/notes)  Intervention: Prevent or Manage Embolism Risk  Recent Flowsheet Documentation  Taken 05/06/2021 2000 by Tonita Cong, RN  VTE Prevention/Management: sequential compression devices on     Problem: Fracture Stabilization and Management (Orthopaedic Fracture)  Goal: Fracture Stability  Outcome: Ongoing (see interventions/notes)     Problem: Functional Ability Impaired (Orthopaedic Fracture)  Goal: Optimal Functional Ability  Outcome: Ongoing (see interventions/notes)  Intervention: Optimize Functional Ability  Recent Flowsheet Documentation  Taken 05/06/2021 2200 by Tonita Cong, RN  Positioning/Transfer Devices: pillows  Taken 05/06/2021 2000 by Tonita Cong, RN  Self-Care Promotion: BADL personal routines maintained  Activity Management: bedrest  Positioning/Transfer Devices: pillows  Range of Motion: ROM (range of motion) performed     Problem: Infection (Orthopaedic Fracture)  Goal: Absence of Infection Signs and Symptoms  Outcome: Ongoing (see interventions/notes)  Intervention: Prevent or Manage Infection  Recent Flowsheet Documentation  Taken 05/06/2021 2000 by Tonita Cong, RN  Fever Reduction/Comfort Measures:   lightweight bedding   lightweight clothing  Infection Prevention:   barrier precautions utilized   environmental surveillance performed     Problem: Neurovascular Compromise (Orthopaedic Fracture)  Goal: Effective Tissue Perfusion  Outcome: Ongoing (see interventions/notes)     Problem: Pain (Orthopaedic Fracture)  Goal: Acceptable Pain Control  Outcome: Ongoing (see interventions/notes)     Problem: Respiratory Compromise (Orthopaedic Fracture)  Goal: Effective Oxygenation and Ventilation  Outcome: Ongoing (see interventions/notes)  Intervention: Promote Airway Secretion Clearance  Recent Flowsheet Documentation  Taken 05/06/2021 2000 by Tonita Cong,  RN  Cough And Deep Breathing: unable to perform     Problem: ARDS (Acute Respiratory Distress Syndrome)  Goal: Effective Oxygenation  Outcome: Ongoing (see interventions/notes)  Problem: Adjustment to Injury (Multiple Trauma)  Goal: Optimal Coping with Effects of Injury  Outcome: Ongoing (see interventions/notes)  Intervention: Support Adjustment to Injury  Recent Flowsheet Documentation  Taken 05/06/2021 2000 by Tonita Cong, RN  Supportive Measures:   positive reinforcement provided   relaxation techniques promoted     Problem: Bleeding (Multiple Trauma)  Goal: Absence of Bleeding  Outcome: Ongoing (see interventions/notes)     Problem: Cerebral Tissue Perfusion Risk (Multiple Trauma)  Goal: Effective Cerebral Tissue Perfusion  Outcome: Ongoing (see interventions/notes)     Problem: Fluid Imbalance (Multiple Trauma)  Goal: Fluid Balance  Outcome: Ongoing (see interventions/notes)     Problem: Functional Ability Impaired (Multiple Trauma)  Goal: Optimal Functional Ability  Outcome: Ongoing (see interventions/notes)  Intervention: Optimize Functional Ability  Recent Flowsheet Documentation  Taken 05/06/2021 2200 by Tonita Cong, RN  Positioning/Transfer Devices: pillows  Taken 05/06/2021 2000 by Tonita Cong, RN  Self-Care Promotion: BADL personal routines maintained  Activity Management: bedrest  Positioning/Transfer Devices: pillows  Range of Motion: ROM (range of motion) performed     Problem: Infection (Multiple Trauma)  Goal: Absence of Infection Signs and Symptoms  Outcome: Ongoing (see interventions/notes)  Intervention: Prevent or Manage Infection  Recent Flowsheet Documentation  Taken 05/06/2021 2000 by Tonita Cong, RN  Fever Reduction/Comfort Measures:   lightweight bedding   lightweight clothing  Infection Prevention:   barrier precautions utilized   environmental surveillance performed     Problem: Neurovascular Compromise (Multiple Trauma)  Goal: Effective Peripheral Tissue Perfusion  Outcome: Ongoing  (see interventions/notes)     Problem: Pain (Multiple Trauma)  Goal: Acceptable Pain Control  Outcome: Ongoing (see interventions/notes)     Problem: Respiratory Compromise (Multiple Trauma)  Goal: Effective Oxygenation and Ventilation  Outcome: Ongoing (see interventions/notes)  Intervention: Promote Airway Secretion Clearance  Recent Flowsheet Documentation  Taken 05/06/2021 2000 by Tonita Cong, RN  Cough And Deep Breathing: unable to perform  Activity Management: bedrest  Intervention: Optimize Oxygenation and Ventilation  Recent Flowsheet Documentation  Taken 05/06/2021 2200 by Tonita Cong, RN  Head of Bed [facility]) Positioning: HOB at 30-45 degrees  Taken 05/06/2021 2000 by Tonita Cong, RN  Head of Bed [facility]) Positioning: HOB at 30-45 degrees     Problem: Fluid Imbalance (Pneumonia)  Goal: Fluid Balance  Outcome: Ongoing (see interventions/notes)     Problem: Infection (Pneumonia)  Goal: Resolution of Infection Signs and Symptoms  Outcome: Ongoing (see interventions/notes)  Intervention: Prevent Infection Progression  Recent Flowsheet Documentation  Taken 05/06/2021 2000 by Tonita Cong, RN  Fever Reduction/Comfort Measures:   lightweight bedding   lightweight clothing     Problem: Respiratory Compromise (Pneumonia)  Goal: Effective Oxygenation and Ventilation  Outcome: Ongoing (see interventions/notes)  Intervention: Promote Airway Secretion Clearance  Recent Flowsheet Documentation  Taken 05/06/2021 2000 by Tonita Cong, RN  Cough And Deep Breathing: unable to perform  Intervention: Optimize Oxygenation and Ventilation  Recent Flowsheet Documentation  Taken 05/06/2021 2200 by Tonita Cong, RN  Head of Bed [facility]) Positioning: HOB at 30-45 degrees  Taken 05/06/2021 2000 by Tonita Cong, RN  Head of Bed [facility]) Positioning: HOB at 30-45 degrees     Problem: Skin Injury Risk Increased  Goal: Skin Health and Integrity  Outcome: Ongoing (see interventions/notes)  Intervention: Optimize Skin Protection  Recent Flowsheet  Documentation  Taken 05/06/2021 2200 by Tonita Cong, RN  Head of Bed [facility]) Positioning: HOB at 30-45 degrees  Taken 05/06/2021 2000 by Tonita Cong, RN  Pressure Reduction Techniques:   (L,M,H,VH)  Turn Schedule - Minimum every 2 hours   heels elevated off bed  Pressure Reduction Devices:   (M,H,VH) Use Repositioning Devices or Pillows   heels elevated off bed  Head of Bed (HOB) Positioning: HOB at 30-45 degrees     Problem: Alcohol Withdrawal  Goal: Alcohol Withdrawal Symptom Control  Outcome: Ongoing (see interventions/notes)     Problem: Acute Neurologic Deterioration (Alcohol Withdrawal)  Goal: Optimal Neurologic Function  Outcome: Ongoing (see interventions/notes)     Problem: Substance Misuse (Alcohol Withdrawal)  Goal: Readiness for Change Identified  Outcome: Ongoing (see interventions/notes)  Intervention: Partner to Facilitate Behavior Change  Recent Flowsheet Documentation  Taken 05/06/2021 2000 by Tonita Cong, RN  Supportive Measures:   positive reinforcement provided   relaxation techniques promoted

## 2021-05-06 NOTE — Progress Notes (Signed)
Name: Hector [name] MRN:  [id number]   Date of admission: 05/01/2021 Age: 60 y.o.       Name - Hector [name]    Date of service - 05/06/2021      Interval history  No acute events overnight.  Patient remains intubated in the ICU.  Unfortunately his chest x-rays continue to local worse with pneumonia.  Patient underwent bronchoscopy yesterday.  So far respiratory cultures are growing out strep pneumo from 05/02/2021.  Patient is tolerating his tube feeds.  Foley catheter in place.  I spoke with the patient's wife who is at the bedside today.    Physical exam  Filed Vitals:    05/06/21 1000 05/06/21 1100 05/06/21 1115 05/06/21 1200   BP: 109/60 (!) 106/59  (!) 103/59   Pulse: 80 80  79   Resp: (!) 22 (!) 23  (!) 21   Temp:  37.2 C (98.9 F) 37.1 C (98.8 F)    SpO2: 96% 96% 97% 97%          Intake/Output Summary (Last 24 hours) at 05/06/2021 1241  Last data filed at 05/06/2021 1200  Gross per 24 hour   Intake 2490 ml   Output 1580 ml   Net 910 ml        General:Intubated in the ICU with sedation  Abdomen: soft, nontender, nondistended,small umbilical hernia  Skin: No jaundice, lesions, or rashes.   Lungs:On the ventilator and lungs are clear this morning  CV: Hemodynamically stable  Extremities: no edema      Labs/Imaging -     XR AP MOBILE CHEST performed on 05/06/2021 6:32 AM    CLINICAL HISTORY: Trauma.  trauma/vent/resp failure    TECHNIQUE: Frontal view of the chest.    COMPARISON:  Yesterday    FINDINGS:  ET tube 3 cm above the carina. There is an NG tube in the stomach. There is a left PICC catheter in the SVC.   Heart size is moderately enlarged.     There are bilateral perihilar infiltrates   Mild right dorsal scoliosis      IMPRESSION:  Persistent perihilar infiltrates/vascular congestion slightly worse on the left since yesterday        Respiratory culture 05/02/2021    UPPER RESPIRATORY CULTURE Moderate Growth Streptococcus pneumoniaeAbnormal       Light Growth Normal Respiratory  Flora    Not predominant in culture, further workup only up   on request.           GRAM STAIN  1+ Rare Epithelial Cells      2+ Few WBCs      2+ Few Gram Positive Rods      3+ Several Gram Positive Coccobacilli      2+ Few Gram Positive Cocci in Pairs      2+ Few Gram Positive Cocci/Clusters               ASSESSMENT & RECOMMENDATIONS:   59 y.o.malestatus post fall downstairs sustaining multiple left-sided rib fractures with subcutaneous emphysema,transverse process fracture of thoracic spine,and grade 3 splenic laceration  - patient to remain intubated in the ICU for today  -antibiotics for the developing pneumonia...  Cultures from 05/02/2021 is strep pneumo growth  -continue close monitoring of his hemoglobin,we will transfuse as needed and likely give platelets if he requires transfusion  -patient is a daily alcoholic with evidence of cirrhosis with portal hypertension and thrombocytopenia.Marland KitchenMarland KitchenIf there is concern about bleeding from his splenic laceration then he  will need to be embolized by Interventional Radiology or vascular surgery.Marland KitchenMarland KitchenPatient is an extremely poor surgical candidate  -  I added Lovenox for DVT prophylaxis today    Clinton Quant, MD      Note: A portion of this documentation was generated using MMODAL (voice recognition software) and may contain syntax/voice recognition errors.

## 2021-05-06 NOTE — Progress Notes (Signed)
[facility] were consulted to work with pt for ETOH. At this time, pt is still sedated and on a ventilator. [facility] will follow up 05/08/2021 to check on status of pt, in hopes of completing initial BI.

## 2021-05-06 NOTE — Care Management Notes (Signed)
[facility] Management Note    Patient Name: Hector [name]  Date of Birth: 21-Jun-1961  Sex: male  Date/Time of Admission: 05/01/2021  9:05 AM  Room/Bed: 367/A  Payor: [place] / Plan: [place] / Product Type: Medicaid [place] /    LOS: 5 days   Primary Care Providers:  Pcp, No (General)    Admitting Diagnosis:  Multiple rib fractures [S22.49XA]    Assessment:      05/06/21 1114   Assessment Details   Assessment Type Continued Assessment   Care Management Plan   Discharge Planning Status plan in progress       Discharge Plan:  Undetermined at this time    Patient remains in ICU on the ventilator at this time. Discharge disposition and needs to be determined.     The patient will continue to be evaluated for developing discharge needs.     Case Manager: Malcolm Metro, RN  Phone: [phone]

## 2021-05-06 NOTE — PT Evaluation (Signed)
[facility] [place], [place] [address]  [phone]         Physical Therapy Acute Care  Evaluation    Date: 05/06/2021  Time: 8366  Patient's Name: Hector [name]  Date of Birth: 11-Apr-1961  Room Number: 367/A    Physical Therapy Order & Physician:   PT Eval - J. Frances Furbish for Dr Gale Journey     Medical Diagnosis :    Spleen injury    History of Present Illness :   Admitted 5/18 after falling down 11 steps while intoxicated. Refused to go to ED then presented as a Level II trauma. Sustained multiple rib fractures, splenic laceration, rib fractures, fracture of T spine transverse process of T5, [email or web address] for respiratory distress on 5/19.     Relevant Past Medical History :      Past Medical History:   Diagnosis Date   . Alcohol abuse    . Cirrhosis (CMS [facility])    . Delirium, withdrawal, alcoholic (CMS [facility])    . HTN (hypertension)    . Unknown cause of injury     fx nose   . Wears glasses             Precautions / Limitations :   No known precautions / limitations     Weight Bearing status, if applicable :   No Weightbearing restrictions     Living Arrangement :   Patient is intubated and sedated with no family present. Uncertain as to home set up or living situation. [facility] EMR, patient is married and assume patient was ambulatory.      Living Environment Comment :   Unable to obtain       Currently Resides with :    Unable to obtain       DME Available :    Unable to obtain       Current Home Oxygen Use :   Patient currently intubated.     Prior Level of Function including ambulation & transfers :        Subjective :   Patient is orally intubated     Pain Rating :   Pre-Treatment Pain: no facial grimacing                                         Orientation &  Level of Consciousness :   Unresponsive and Sedated    Ability to Answer Questions & Follow Commands :   Unable    6-Clicks Score :    1.) Turning from back to side while flat in bed without  using a bed rail.    Total assistance (1)  2.) Moving from lying on back to sitting on the side of a flat bed without using bed rails.   Total assistance (1)  3.) Moving to and from a bed to a chair, including a wheelchair.    Total assistance (1)  4.) Standing up for chair using your arms (eg, wheelchair or bedside chair).   Total assistance (1)  5.) Walking in hospital room.   Total assistance (1)  6.) Climbing 3-5 steps with a railing.    Total assistance (1)    6 Clicks Plan: Score <29 - PT consult is appropriate; placement may be necessary at discharge    Objective :  Ill appearing male supine in bed, sedated on vent. Multiple lines including sedation. Wrists restrained. Generalized edema.     Strength :    no volitional movement noted in ext  Limitations        Neuro including sensation, coordination, reflexes & tone :        Range of Motion :     Range of Motion Right   Left   Upper Extremity  Within functional limits, passively   Within functional limits, passively    Lower Extremity   Within functional limits, passively   Within functional limits, passively            Current Functional Ability :     Transfers:   unable to assess transfers due to status.     Balance/Endurance:  Standing Balance:                 Sitting Balance:                                                                      Endurance:          PT Treatment Diagnosis :   Generalized Weakness and Difficulty In Walking       Rehabilitation Potential :    Good     Treatment Goals :     Transfers:      When medically able to perform      Rolling in bed:    Minimal Assist  with assist of 2                                                Supine to sit:   Minimal Assist   with assist of 2                                Sit to stand:  Minimal Assist   with assist of 2                                              Gait:  Distance:   10' to 56'                Device Utilized:   as needed          Level of Assist:      Minimal Assist  with assist  of  2      Weight-Bearing Status:             Stairs:        ROM:                           Strength:    Increase strength via therapeutic exercise for patient to accomplish stated mobility goals               Pain:  Patient / Family Goal :   unable to obtain     Treatment Plan  :   Gait training , Transfer training , Therapeutic exercise / Range of Motion exercises  and Patient / Family education      Therapy Frequency :   3-5 times a week     Time Estimate of Goal Attainment  :   15 days    Progress Note :   Patient seen bedside for initial eval and initiation of therapy. Patient remains orally intubated and sedated. No family present for information regarding prior status however assume patient was independent with all mobility. generalized edema noted in ext. Initiated PROM.     Evaluation Complexity :   Low    Low: History 0; Examination 1-2; Presentation Stable   Moderate: History 1-2; Examination 3+; Presentation Evolving   High: History 3+; Examination 4+; Presentation Evolving / Unstable     Treatment Provided This Session :   Gait training , Transfer training , Therapeutic exercise / Range of Motion exercises  and Patient / Family education progressing as [facility] patient condition     Charges :     Timed Charge Number of Minutes   Neuromuscular Re-Education     Gait Training      Therapeutic Activity      Therapeutic Exercise   8   Total Treatment Time  8     Non Timed Charge Number of  Units   Physical Therapy  Evaluation Low   1   Physical Therapy  Evaluation Moderate              Physical Therapy  Evaluation High                   Re-Evaluation               Canalith Repositioning                     Post Discharge Equipment Needs :    To Be Determined    Recommended Discharge Disposition  :   Long Term Acute Care (LTAC)    Physical Therapy Plan of Care was discussed with patient / family ? :   Yes    Jackie Plum, PT, DPT 05/06/2021 11:09

## 2021-05-06 NOTE — Ancillary Notes (Signed)
[facility] Therapy Follow Up       Reason for Assessment: Follow up and consult received to determine appropriate tube feeding.     SUBJECTIVE :   Pt intubated on vent    OBJECTIVE:  Height: 182.9 cm (6')  Admit Weight: 90.7 kg (200 lb)  Weight: 104 kg (229 lb 4.5 oz)  BMI (Calculated): 31.16     Estimated Needs:  1892-2150kcals (22-25kcal/kg) ABW  81-129.6gms protein (1.0-1.6gm/kg pro) IBW  Fluids per Physician.     BMP (Last 48 Hours):  Recent Results in last 48 hours     05/05/21  0406 05/05/21  0439 05/06/21  0431 05/06/21  0433 05/06/21  1315   SODIUM 132* 137 131* 139 140   POTASSIUM 3.7  --  3.6  --  3.6   CHLORIDE 107 108 108* 109 109*   CO2 17*  --  18*  --  28   BUN 10  --  10  --  12   CREATININE 0.32*  --  0.30*  --  0.48*   CALCIUM 6.6*  --  6.7*  --  7.7*   GLUCOSENF 82  --  87  --  108*           Meds: ampicillin-sulbactam (UNASYN) 3 g in NS 100 mL IVPB minibag, 3 g, Intravenous, Q6H  chlorhexidine gluconate (PERIDEX) 0.12% mouthwash, 15 mL, Swish & Spit, 2x/day  docusate sodium (COLACE) 10mg  per mL oral liquid, 100 mg, Gastric (NG, OG, PEG, GT), 2x/day  enoxaparin PF (LOVENOX) 40 mg/0.4 mL SubQ injection, 40 mg, Subcutaneous, Daily  fentaNYL (PF) (SUBLIMAZE) 1,250 mcg in NS 250 mL (tot vol) infusion, 25 mcg/hr, Intravenous, Continuous  haemophilus B conj-tetanus toxoid (ACTHIB) injection, 0.5 mL, IntraMUSCULAR, Once  hydrALAZINE (APRESOLINE) injection 10 mg, 10 mg, Intravenous, Q6H PRN  HYDROmorphone (DILAUDID) 1 mg/mL injection, 1 mg, Intravenous, Q2H PRN  ipratropium-albuterol 0.5 mg-3 mg(2.5 mg base)/3 mL Solution for Nebulization, 3 mL, Nebulization, Q4H  lidocaine (LIDODERM) 5% patch, 1 Patch, Transdermal, Daily  LORazepam (ATIVAN) tablet, 1 mg, Oral, Q2H PRN  LORazepam (ATIVAN) tablet, 2 mg, Oral, Q2H PRN  LORazepam (ATIVAN) tablet, 4 mg, Oral, Q2H PRN  LR premix infusion, , Intravenous, Continuous  meningococcal conjugate (PF) vaccine (MENVEO) IM injection, 0.5 mL,  IntraMUSCULAR, Once  meningococcal group B vaccine (BEXSERO) IM injection, 0.5 mL, IntraMUSCULAR, Once  metoprolol (LOPRESSOR) 1 mg/mL injection, 5 mg, Intravenous, Q6H PRN  multivitamin (THERA) tablet, 1 Tablet, Oral, Daily  NS flush syringe, 10 mL, Intravenous, Q8HRS  NS flush syringe, 20 mL, Intravenous, Q1 MIN PRN  pantoprazole (PROTONIX) injection, 40 mg, Intravenous, Q12H   And  NS flush syringe, 10 mL, Intravenous, Daily  nystatin (NYSTOP) 100,000 units/g topical powder, , Apply Topically, 2x/day  ondansetron (ZOFRAN) 2 mg/mL injection, 4 mg, Intravenous, Q4H PRN  PHENobarbital (LUMINAL) 130 mg/mL injection, 30 mg, Intravenous, Q8H  pneumococcal 13-valent conjugate vaccine (PREVNAR 13) IM injection, 0.5 mL, IntraMUSCULAR, Once  potassium chloride 20 mEq in SW 100 mL premix infusion, 20 mEq, Intravenous, Once  propofol (DIPRIVAN) 10 mg/mL premix infusion, 10 mcg/kg/min (Adjusted), Intravenous, Continuous  sennosides (SENNA) tablet, 8.6 mg, Oral, 2x/day  thiamine (VITAMIN B1) 100 mg in NS 50 mL IVPB, 100 mg, Intravenous, Q24H    Tube feeding infusing: Jevity 1.2 Cal 74ml/hr; 30 ml water flush every 4 hours providing: 1152 kcals; 53gm Protein; 38 gm fat; 774 ml free water.  Propofol infusing 24.8 ml/hr providing; 583 ml; 654 kcals.  Comments: Inadequate in take of protein with propofol infusion.    PLAN / INTERVENTION        Goals:  Meet estimated needs for Nutrients    Monitor / Evaluate: Tube feeding infusion.     Recommend : Vital High Protein at 60 ml/hr 1440 kcals; 126 gm  protein, 1200 ml free water; 33 gm fat.  2095 kcals with propofol.     Discussed with RN.   Will change formula to Vital High Protein at 60 ml/hr unless ordered otherwise.       Corey Harold, RDLD 05/06/2021, 15:34

## 2021-05-06 NOTE — Care Management Notes (Signed)
Patient remains intubated. SS unable to establish DC plan at this time nor discuss treatment options as ordered. SS will continue to follow and assist.    Alma Friendly. [name], [place]

## 2021-05-07 ENCOUNTER — Inpatient Hospital Stay (HOSPITAL_COMMUNITY): Payer: MEDICAID

## 2021-05-07 ENCOUNTER — Ambulatory Visit (HOSPITAL_COMMUNITY)
Admission: RE | Admit: 2021-05-07 | Discharge: 2021-05-07 | Disposition: A | Discharge status: Home / Self Care | Payer: MEDICAID | Source: Ambulatory Visit

## 2021-05-07 ENCOUNTER — Other Ambulatory Visit: Payer: Self-pay

## 2021-05-07 DIAGNOSIS — T797XXD Traumatic subcutaneous emphysema, subsequent encounter: Secondary | ICD-10-CM | POA: Insufficient documentation

## 2021-05-07 DIAGNOSIS — J9601 Acute respiratory failure with hypoxia: Secondary | ICD-10-CM

## 2021-05-07 DIAGNOSIS — R0602 Shortness of breath: Secondary | ICD-10-CM | POA: Insufficient documentation

## 2021-05-07 DIAGNOSIS — S2242XA Multiple fractures of ribs, left side, initial encounter for closed fracture: Secondary | ICD-10-CM

## 2021-05-07 DIAGNOSIS — J90 Pleural effusion, not elsewhere classified: Secondary | ICD-10-CM

## 2021-05-07 DIAGNOSIS — S2242XD Multiple fractures of ribs, left side, subsequent encounter for fracture with routine healing: Secondary | ICD-10-CM | POA: Insufficient documentation

## 2021-05-07 DIAGNOSIS — S0003XA Contusion of scalp, initial encounter: Secondary | ICD-10-CM

## 2021-05-07 DIAGNOSIS — J154 Pneumonia due to other streptococci: Secondary | ICD-10-CM

## 2021-05-07 LAB — CBC
HCT: 33.9 % — ABNORMAL LOW (ref 36.0–46.0)
HGB: 11.2 g/dL — ABNORMAL LOW (ref 13.9–16.3)
MCH: 33.5 pg (ref 25.4–34.0)
MCHC: 33 g/dL (ref 30.0–37.0)
MCV: 101.3 fL — ABNORMAL HIGH (ref 80.0–100.0)
MPV: 9.1 fL (ref 7.5–11.5)
PLATELETS: 82 10*3/uL — ABNORMAL LOW (ref 130–400)
RBC: 3.34 10*6/uL — ABNORMAL LOW (ref 4.30–5.90)
RDW: 15.8 % — ABNORMAL HIGH (ref 11.5–14.0)
WBC: 4 10*3/uL — ABNORMAL LOW (ref 4.5–11.5)

## 2021-05-07 LAB — ARTERIAL BLOOD GAS, CO-OX, LYTES, LACTATE REFLEX
%FIO2 (ARTERIAL): 60 %
(T) PCO2: 43 mm/Hg (ref 35.0–45.0)
(T) PO2: 73 mm/Hg (ref 72.0–100.0)
BASE EXCESS (ARTERIAL): 4.5 mmol/L — ABNORMAL HIGH (ref 0.0–1.0)
BICARBONATE (ARTERIAL): 28.4 mmol/L — ABNORMAL HIGH (ref 18.0–26.0)
CARBOXYHEMOGLOBIN: 2.2 % (ref 0.0–2.5)
CHLORIDE: 109 mmol/L (ref 101–111)
GLUCOSE: 101 mg/dL (ref 60–105)
HEMATOCRITRT: 34 %
HEMOGLOBIN: 11.2 g/dL — ABNORMAL LOW (ref 12.0–18.0)
IONIZED CALCIUM: 1.15 mmol/L (ref 1.10–1.35)
LACTATE: 0.6 mmol/L (ref 0.0–1.3)
MET-HEMOGLOBIN: 1 % (ref 0.0–2.0)
O2CT: 14.8 % — ABNORMAL LOW (ref 15.7–24.3)
OXYHEMOGLOBIN: 93.8 % (ref 85.0–98.0)
PAO2/FIO2 RATIO: 122 (ref <=200)
PCO2 (ARTERIAL): 43 mm/Hg (ref 35–45)
PH (ARTERIAL): 7.44 (ref 7.35–7.45)
PH (T): 7.44 (ref 7.35–7.45)
PO2 (ARTERIAL): 73 mm/Hg (ref 72–100)
SODIUM: 139 mmol/L (ref 137–145)
TEMPERATURE, COMP: 37 C
WHOLE BLOOD POTASSIUM: 3.9 mmol/L (ref 3.5–4.6)

## 2021-05-07 LAB — LDH, BODY FLUID: LDH BODY FLUID: 815 U/L

## 2021-05-07 LAB — BASIC METABOLIC PANEL
ANION GAP: 1 mmol/L — ABNORMAL LOW (ref 5–19)
BUN/CREA RATIO: 30 — ABNORMAL HIGH (ref 6–20)
BUN: 14 mg/dL (ref 9–20)
CALCIUM: 7.8 mg/dL — ABNORMAL LOW (ref 8.4–10.2)
CHLORIDE: 110 mmol/L — ABNORMAL HIGH (ref 98–107)
CO2 TOTAL: 28 mmol/L (ref 22–30)
CREATININE: 0.47 mg/dL — ABNORMAL LOW (ref 0.66–1.20)
ESTIMATED GFR: >60 mL/min/{1.73_m2} (ref >60)
GLUCOSE: 113 mg/dL — ABNORMAL HIGH (ref 74–106)
POTASSIUM: 4 mmol/L (ref 3.5–5.1)
SODIUM: 139 mmol/L (ref 137–145)

## 2021-05-07 LAB — MAGNESIUM: MAGNESIUM: 2.3 mg/dL (ref 1.6–2.3)

## 2021-05-07 LAB — PHOSPHORUS: PHOSPHORUS: 3.9 mg/dL (ref 2.5–4.5)

## 2021-05-07 LAB — GLUCOSE BODY FLUID: GLUCOSE BODY FLUID: 104 mg/dL

## 2021-05-07 LAB — PROTEIN BODY FLUID: PROTEIN BODY FLUID: 3 g/dL

## 2021-05-07 LAB — POTASSIUM: POTASSIUM: 4 mmol/L (ref 3.5–5.1)

## 2021-05-07 MED ORDER — FENTANYL (PF) 50 MCG/ML INJECTION SOLUTION
INTRAMUSCULAR | Status: AC
Start: 2021-05-07 — End: 2021-05-08
  Filled 2021-05-07: qty 2

## 2021-05-07 MED ORDER — FUROSEMIDE 10 MG/ML INJECTION SOLUTION
40.0000 mg | Freq: Once | INTRAMUSCULAR | Status: AC
Start: 2021-05-07 — End: 2021-05-07
  Administered 2021-05-07: 40 mg via INTRAVENOUS
  Filled 2021-05-07: qty 4

## 2021-05-07 NOTE — Care Plan (Signed)
Patient remains sedated on the vent. Family updated on the plan of care.       Problem: Adult Inpatient Plan of Care  Goal: Plan of Care Review  Outcome: Ongoing (see interventions/notes)  Goal: Patient-Specific Goal (Individualized)  Outcome: Ongoing (see interventions/notes)  Goal: Absence of Hospital-Acquired Illness or Injury  Outcome: Ongoing (see interventions/notes)  Intervention: Identify and Manage Fall Risk  Recent Flowsheet Documentation  Taken 05/07/2021 0700 by Toni Arthurs, RN  Safety Promotion/Fall Prevention: fall prevention program maintained  Intervention: Prevent Skin Injury  Recent Flowsheet Documentation  Taken 05/07/2021 0700 by Toni Arthurs, RN  Body Position:   supine   turned q 2 hours  Intervention: Prevent and Manage VTE (Venous Thromboembolism) Risk  Recent Flowsheet Documentation  Taken 05/07/2021 0700 by Toni Arthurs, RN  VTE Prevention/Management:   sequential compression devices on   anticoagulant therapy maintained  Intervention: Prevent Infection  Recent Flowsheet Documentation  Taken 05/07/2021 0700 by Toni Arthurs, RN  Infection Prevention: barrier precautions utilized  Goal: Optimal Comfort and Wellbeing  Outcome: Ongoing (see interventions/notes)     Problem: Fall Injury Risk  Goal: Absence of Fall and Fall-Related Injury  Outcome: Ongoing (see interventions/notes)  Intervention: Promote Injury-Free Environment  Recent Flowsheet Documentation  Taken 05/07/2021 0700 by Toni Arthurs, RN  Safety Promotion/Fall Prevention: fall prevention program maintained     Problem: Communication Impairment (Mechanical Ventilation, Invasive)  Goal: Effective Communication  Outcome: Ongoing (see interventions/notes)     Problem: Device-Related Complication Risk (Mechanical Ventilation, Invasive)  Goal: Optimal Device Function  Outcome: Ongoing (see interventions/notes)  Intervention: Optimize Device Care and Function  Recent Flowsheet Documentation  Taken 05/07/2021 1500 by Toni Arthurs,  RN  Airway Safety Measures:   manual resuscitator at bedside   mask at bedside  Taken 05/07/2021 1100 by Toni Arthurs, RN  Airway Safety Measures:   manual resuscitator at bedside   mask at bedside  Taken 05/07/2021 0700 by Toni Arthurs, RN  Airway Safety Measures:   manual resuscitator at bedside   mask at bedside     Problem: Inability to Wean (Mechanical Ventilation, Invasive)  Goal: Mechanical Ventilation Liberation  Outcome: Ongoing (see interventions/notes)     Problem: Skin and Tissue Injury (Mechanical Ventilation, Invasive)  Goal: Absence of Device-Related Skin and Tissue Injury  Outcome: Ongoing (see interventions/notes)     Problem: Non-violent/Non-Self Destructive Restraints  Goal: Alternative methods tried prior to restraints  Outcome: Ongoing (see interventions/notes)  Goal: Patient free from injury and discomfort  Outcome: Ongoing (see interventions/notes)

## 2021-05-07 NOTE — PT Treatment (Signed)
[facility]  [place], [place] [address]  [phone]         Physical Therapy Acute Care Daily Treatment Record       Date: 05/07/2021  Time: 1122  Patient's Name: Hector [name]  Date of Birth: 09/11/1961  Room Number: 367/A    Pertinent Information since last Physical Therapy visit :    Remains orally intubated and sedated      Precautions / Limitations :    No known precautions / limitations     Weight Bearing status, if applicable :    No Weightbearing restrictions     Subjective :   Orally intubated     Pain Rating :    Comments:       No response to treatment                            Orientation & Level of Orientation :    Unresponsive and Sedated      6 Clicks Score :    1.) Turning from back to side while flat in bed without using a bed rail.    Total assistance (1)  2.) Moving from lying on back to sitting on the side of a flat bed without using bed rails.   Total assistance (1)  3.) Moving to and from a bed to a chair, including a wheelchair.    Total assistance (1)  4.) Standing up for chair using your arms (eg, wheelchair or bedside chair).   Total assistance (1)  5.) Walking in hospital room.   Total assistance (1)  6.) Climbing 3-5 steps with a railing.    Total assistance (1)    6 Clicks Plan: Score <25 - PT consult is appropriate; placement may be necessary at discharge      Treatment :         Sitting Balance:                            Standing Balance:                    Stairs:                            Therapeutic Exercise:     PROM to le's while supine in bed. No response to treatment. Generalized edema noted.              Therapeutic Activity:             Canalith Repositioning:             Other Treatment Interventions:       Daily Assessment of Status :    Remains intubated and sedated       Timed Charge Number of Minutes   Neuromuscular Re-Education     Gait Training      Therapeutic Activity      Therapeutic  Exercise   8   Canalith Re-positioning      Other     Total Treatment Time  8     Patient Education :            Interdisciplinary Communication :                Discharge Planning :  Long Term Acute Care (LTAC)    Plan :   Continue Physical Therapy program     Jackie Plum, PT, DPT  05/07/2021, 12:04

## 2021-05-07 NOTE — Progress Notes (Signed)
[facility]    ICU Progress Note    Patient Name: Hector [name]  Date: 05/07/2021   Department:  [facility]  MRN: [id number]  DOB: 1961/02/23    Active Hospital Problems    Diagnosis   . On mechanically assisted ventilation (CMS HCC)   . Multiple rib fractures   . Left pulmonary contusion   . 1Spleen injury   . Closed fracture of transverse process of thoracic vertebra (CMS HCC)   . 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 Thrombocytopenia (CMS HCC)   . Alcohol abuse         HPI: Bee Hammerschmidt Smithis a 60 y.o.,malewith past medical history of alcoholism and hypertension who presented as a level 2 trauma s/p falling down approximately 11 stairs while intoxicated. He initially refused medical treatment but presented to the ED on 5/18 after awaking later that morning with severe shortness of breath and posterior chest wall pain. He was noted to have bilateral shoulder and left posterior scalp ecchymosis, left forearm skin tear, and a torn right great toe torn toenail. CTA C/A/P revealed a small left pneumothorax <5%. Atelectatasis bilaterally Rt>Lt. Trace left pleural effusion. Multiple displaced and comminuted left rib fractures. SubQ air left chest wall extending into the left flank. Grade 3 Splenic Trauma. Cirrhosis with evidence of portal hypertension. CT thoracic spine showed multiple rib fractures (medial left 4th- 6th and sixth ribs and posterior lateral left 4th-7th . Possible non-displaced left transverse process fractures of T5 and T6. He was admitted to [facility] under the Trauma Service.Communication with Vascular Surgery, although not a formal consult, regarding concern for splenic laceration bleeding. Close monitoring of H+H's.   On 5/19, on assessment he was noted to be lethargic with increased work of breathing, accessory muscle use, and gurgling respirations. There was concern he was unable to clear his secretions. He was transferred to ICU for intubation. On arrival to ICU,  he was lethargic but oriented and answered questions appropriately. On 100% NRB with increased work of breathing and subsequently intubated for airway protection. Hypertension post intubation that improved with sedation.   Per significant other, [name], the patient drinks approximately15 beers and 2 [employer]/ daily but occasionally more. He is functioning in the morning and is able to complete tasks around the house but does not drive or leave the house. He begins drinking in the afternoon and is unable to stand/ walk by night.    5/20: Remains critically ill in ICU on mechanical ventilation. No acute events overnight. Repeat CT C/A/P showed resolution of left pneumothorax, worsening bilateral lower lobe consolidation, decreasing subQ air in chest wall surrounding left sided rib fractures, and a stable splenic laceration.Tube feeding started.    5/21: No acute events overnight, remains critically ill intubated and sedated on the vent. Currently on a Phenobarb taper for ETOH withdrawal and tolerating well. Sputum culture + today for Streptococcus P and Unasyn dosing increased to 3G for more complete coverage.    5/22: No acute events overnight, remains sedated on mechanical ventilation. Per trauma team notes, they are ok with increasing TF to goal but want to continue to hold off on starting pharm DVT prophylaxis with drop in Hgb, high risk for potential bleed from the splenic laceration. Issues with patient biting down on ETT and bite block in place.     5/23: No acute events overnight, remains sedated on mechanical ventilation.     5/24: No overnight issues. Remains intubated and  sedated on 60%FIO2. Physical exam unchanged when reviewed. Unable to participate in ROS.       PMHx:   Past Medical History:   Diagnosis Date   . Alcohol abuse    . Cirrhosis (CMS [place])    . Delirium, withdrawal, alcoholic (CMS HCC)    . HTN (hypertension)    . Unknown cause of injury     fx nose   . Wears glasses        PSHx:  Past  Surgical History:   Procedure Laterality Date   . HX TONSILLECTOMY             FHx:  Family History   Problem Relation Name Age of Onset   . Asthma Father          SHx:  Social History     Socioeconomic History   . Marital status: Divorced   Tobacco Use   . Smoking status: Current Every Day Smoker     Packs/day: 1.50   . Smokeless tobacco: Never Used   Vaping Use   . Vaping Use: Never used   Substance and Sexual Activity   . Alcohol use: Yes     Alcohol/week: 15.0 standard drinks     Types: 15 Cans of beer per week     Comment: daily   . Drug use: Yes     Types: Marijuana     Comment: daily   . Sexual activity: Yes        ROS:  Review of systems was not obtained due to current medical condition.  .    Imaging:   XR AP MOBILE CHEST   Final Result   Persistent perihilar infiltrates/vascular congestion slightly worse on the left since yesterday         Radiologist location ID: [id number]         XR AP MOBILE CHEST   Final Result   1.Bibasilar atelectasis or pneumonia/aspiration   2.No pneumothorax   3.Endotracheal tube terminating 5 cm above the carina         Radiologist location ID: [id number]         XR AP MOBILE CHEST   Final Result   As above         Radiologist location ID: [id number]         XR AP MOBILE CHEST   Final Result   No significant change.         Radiologist location ID: [id number]         CT CHEST ABDOMEN PELVIS W IV CONTRAST   Final Result      RESPIRATORY MOTION DEGRADED EXAM.      INTERVAL RESOLUTION OF THE PREVIOUSLY SEEN SMALL LEFT-SIDED PNEUMOTHORAX.      WORSENING BILATERAL LOWER LOBE CONSOLIDATION THAT COULD REFLECT ATELECTASIS AND/OR PNEUMONIA      REDEMONSTRATION OF MULTIPLE DISPLACED LEFT-SIDED RIB FRACTURES WITH DECREASING SUBCUTANEOUS AIR IN THE CHEST WALL.      THE KNOWN GRADE 3 SPLENIC LACERATION IS NOT AS WELL VISUALIZED ON THE CURRENT EXAM AND IS STABLE TO IMPROVED FROM THE 05/01/2021 EXAM. NO NEW ACUTE FINDINGS IN THE ABDOMEN/PELVIS.         One or more dose reduction techniques  were used (e.g., Automated exposure control, adjustment of the mA and/or kV according to patient size, use of iterative reconstruction technique).         Radiologist location ID: [id number]         XR AP MOBILE CHEST  Final Result   NO SIGNIFICANT CHANGE SINCE THE PRIOR EXAM.         Radiologist location ID: [id number]         XR AP MOBILE CHEST   Final Result   No interval change from 05/02/2021 in the lung fields.         Radiologist location ID: [id number]         XR AP MOBILE CHEST   Final Result   Stable examination compared to 05/02/2021.         Radiologist location ID: [id number]         XR AP MOBILE CHEST   Final Result   1.Decreased left basilar atelectasis or pneumonia. Right basilar atelectasis or pneumonia is unchanged.   2.Left rib fractures without pneumothorax.         Radiologist location ID: [id number]         CT LUMBAR SPINE WO IV CONTRAST   Final Result   NO ACUTE LUMBAR FRACTURE.  DEGENERATIVE CHANGES.         One or more dose reduction techniques were used (e.g., Automated exposure control, adjustment of the mA and/or kV according to patient size, use of iterative reconstruction technique).         Radiologist location ID: [id number]         CT THORACIC SPINE WO IV CONTRAST   Final Result   1.Multiple medial and posterior left rib fractures as described. There are possible transverse process fractures at the T5 and T6 levels on the left as well   2.Small medial left pneumothorax. There is soft tissue air on the left. There is a left basal infiltrate and small left effusion         One or more dose reduction techniques were used (e.g., Automated exposure control, adjustment of the mA and/or kV according to patient size, use of iterative reconstruction technique).         Radiologist location ID: [id number]         CT CERVICAL SPINE WO IV CONTRAST   Final Result   NO ACUTE CERVICAL FRACTURE.  DEGENERATIVE CHANGES.         One or more dose reduction techniques were used (e.g., Automated  exposure control, adjustment of the mA and/or kV according to patient size, use of iterative reconstruction technique).         Radiologist location ID: [id number]         TRAUMA CTA CHEST W CT ABDOMEN PELVIS W IV CONTRAST   Final Result   Addendum 1 of 1   ADDENDUM:      A Critical Document Only message has been documented for [place] ANDERSON in    the [employer] - [employer] Actionable Findings system on 05/01/2021    10:19 AM, Message ID [id number].         Radiologist location ID: [id number]         Final   Grade 3 Splenic Trauma, per the American Association for the Surgery of Trauma (AAST) injury grading system, as described above.      Cirrhosis with evidence of portal hypertension      Small left pneumothorax measuring less than 5%      Atelectatic changes at the lung bases with greater the right with a trace left effusion.      Multiple displaced and comminuted left rib fractures.   Subcutaneous air left chest wall extending into the left flank.  One or more dose reduction techniques were used (e.g., Automated exposure control, adjustment of the mA and/or kV according to patient size, use of iterative reconstruction technique).         Radiologist location ID: [id number]         CT BRAIN WO IV CONTRAST   Final Result   No evidence of acute intracranial injury or change.         One or more dose reduction techniques were used (e.g., Automated exposure control, adjustment of the mA and/or kV according to patient size, use of iterative reconstruction technique).         Radiologist location ID: [id number]         XR AP MOBILE CHEST   Final Result   Bilateral left greater than right acute on chronic infiltrates suggested with possible left effusion. Recommend follow-up.         Radiologist location ID: [id number]         XR AP MOBILE CHEST    (Results Pending)        Labs:  Results for orders placed or performed during the hospital encounter of 05/01/21 (from the past 24 hour(s))   POC BLOOD  GLUCOSE (RESULTS)   Result Value Ref Range    GLUCOSE, POC 113 (H) 74 - 106 mg/dl   BASIC METABOLIC PANEL   Result Value Ref Range    SODIUM 140 137 - 145 mmol/L    POTASSIUM 3.6 3.5 - 5.1 mmol/L    CHLORIDE 109 (H) 98 - 107 mmol/L    CO2 TOTAL 28 22 - 30 mmol/L    ANION GAP 3 (L) 5 - 19 mmol/L    CALCIUM 7.7 (L) 8.4 - 10.2 mg/dL    GLUCOSE 108 (H) 74 - 106 mg/dL    BUN 12 9 - 20 mg/dL    CREATININE 0.48 (L) 0.66 - 1.20 mg/dL    BUN/CREA RATIO 25 (H) 6 - 20    ESTIMATED GFR >60 >60 mL/min/1.60m2   PHOSPHORUS   Result Value Ref Range    PHOSPHORUS 4.0 2.5 - 4.5 mg/dL   MAGNESIUM   Result Value Ref Range    MAGNESIUM 2.7 (H) 1.6 - 2.3 mg/dL   POC BLOOD GLUCOSE (RESULTS)   Result Value Ref Range    GLUCOSE, POC 100 74 - 106 mg/dl   BASIC METABOLIC PANEL   Result Value Ref Range    SODIUM 139 137 - 145 mmol/L    POTASSIUM 3.8 3.5 - 5.1 mmol/L    CHLORIDE 110 (H) 98 - 107 mmol/L    CO2 TOTAL 28 22 - 30 mmol/L    ANION GAP 1 (L) 5 - 19 mmol/L    CALCIUM 7.7 (L) 8.4 - 10.2 mg/dL    GLUCOSE 110 (H) 74 - 106 mg/dL    BUN 13 9 - 20 mg/dL    CREATININE 0.46 (L) 0.66 - 1.20 mg/dL    BUN/CREA RATIO 28 (H) 6 - 20    ESTIMATED GFR >60 >60 mL/min/1.750m   PHOSPHORUS   Result Value Ref Range    PHOSPHORUS 3.9 2.5 - 4.5 mg/dL   MAGNESIUM   Result Value Ref Range    MAGNESIUM 2.5 (H) 1.6 - 2.3 mg/dL   ARTERIAL BLOOD GAS, CO-OX, LYTES, LACTATE REFLEX   Result Value Ref Range    %FIO2 (ARTERIAL) 60 %    PH (ARTERIAL) 7.44 7.35 - 7.45    PCO2 (ARTERIAL) 43 35 - 45 mm/Hg  PO2 (ARTERIAL) 73 72 - 100 mm/Hg    BASE EXCESS (ARTERIAL) 4.5 (H) 0.0 - 1.0 mmol/L    BICARBONATE (ARTERIAL) 28.4 (H) 18.0 - 26.0 mmol/L    TEMPERATURE, COMP 37.0   C    PH (T) 7.44 7.35 - 7.45    (T) PCO2 43.0 35.0 - 45.0 mm/Hg    (T) PO2 73.0 72.0 - 100.0 mm/Hg    SODIUM 139 137 - 145 mmol/L    WHOLE BLOOD POTASSIUM 3.9 3.5 - 4.6 mmol/L    CHLORIDE 109 101 - 111 mmol/L    IONIZED CALCIUM 1.15 1.10 - 1.35 mmol/L    GLUCOSE 101 60 - 105 mg/dL    LACTATE 0.6 0.0 -  1.3 mmol/L    HEMOGLOBIN 11.2 (L) 12.0 - 18.0 g/dL    OXYHEMOGLOBIN 93.8 85.0 - 98.0 %    CARBOXYHEMOGLOBIN 2.2 0.0 - 2.5 %    MET-HEMOGLOBIN 1.0 0.0 - 2.0 %    O2CT 14.8 (L) 15.7 - 24.3 %    PAO2/FIO2 RATIO 122 <=200    HEMATOCRITRT 34 %   BASIC METABOLIC PANEL   Result Value Ref Range    SODIUM 139 137 - 145 mmol/L    POTASSIUM 4.0 3.5 - 5.1 mmol/L    CHLORIDE 110 (H) 98 - 107 mmol/L    CO2 TOTAL 28 22 - 30 mmol/L    ANION GAP 1 (L) 5 - 19 mmol/L    CALCIUM 7.8 (L) 8.4 - 10.2 mg/dL    GLUCOSE 113 (H) 74 - 106 mg/dL    BUN 14 9 - 20 mg/dL    CREATININE 0.47 (L) 0.66 - 1.20 mg/dL    BUN/CREA RATIO 30 (H) 6 - 20    ESTIMATED GFR >60 >60 mL/min/1.67m2   PHOSPHORUS   Result Value Ref Range    PHOSPHORUS 3.9 2.5 - 4.5 mg/dL   MAGNESIUM   Result Value Ref Range    MAGNESIUM 2.3 1.6 - 2.3 mg/dL   CBC   Result Value Ref Range    WBC 4.0 (L) 4.5 - 11.5 x10^3/uL    RBC 3.34 (L) 4.30 - 5.90 x10^6/uL    HGB 11.2 (L) 13.9 - 16.3 g/dL    HCT 33.9 (L) 36.0 - 46.0 %    MCV 101.3 (H) 80.0 - 100.0 fL    MCH 33.5 25.4 - 34.0 pg    MCHC 33.0 30.0 - 37.0 g/dL    RDW 15.8 (H) 11.5 - 14.0 %    PLATELETS 82 (L) 130 - 400 x10^3/uL    MPV 9.1 7.5 - 11.5 fL       Micro:   No results found for any visits on 05/01/21 (from the past 96 hour(s)).    Vitals:   Vitals:    05/07/21 0330 05/07/21 0400 05/07/21 0430 05/07/21 0500   BP: (!) 98/52 (!) 97/49 136/63 (!) 100/57   Pulse: 88 84 87 87   Resp: (!) 22 (!) 22 14 (!) 21   Temp:  37.3 C (99.1 F)     SpO2: 94% 95% 94% 94%   Weight:       Height:       BMI:                Vent Settings:  Mode: APV (CMV)  Set Rate: 22 Breaths Per Minute  Set PEEP: 5 cmH2O  FiO2: 60 %  I:E Ratio: 1:2     PHYSICAL EXAM:  General:Intubated and sedated on the vent.  Resting quietly at this time.  Eyes:PERRLA, 47m brisk. Protectives intact, unable to assess EOM's due sedation.  HENT:Ears and nose appear normal. Mucous membranes moist.ETTintact, no breakdown noted. OGT present.  Neck:supple, symmetrical, trachea  midline.  Lungs:Breath sounds with bilateral rhonchi. Maintained on mechanical ventilation. Respirations non-labored  Cardiovascular:Sinus rhythm, HR 70's.Pulses 2+. Capillary refill brisk. No edema.  Abdomen:Soft, round, non-tender on palpation, BS normoactive. TF infusing via OGT  Extremities:Spontaneously moves all extremities.  Skin:Warm, dry, and intact. Red, moist/fungal rashnoted to bilateral groin  Neurologic:Lethargic but oriented and followed commands prior to intubation. Now remains sedated with RASS -2. PERRL, protectives intact. Arouses to verbal stimulation and makes eye contact. Does not follow commands on sedated  Psychiatric:Unable to assessdue to patient being sedated.     ASSESSMENT AND PLAN     TTarrance Januszewskiis a 60y.o., White male  admitted to ICU for   Active Hospital Problems    Diagnosis   . Primary Problem: Spleen injury   . On mechanically assisted ventilation (CMS HCC)   . Multiple rib fractures   . Left pulmonary contusion   . Closed fracture of transverse process of thoracic vertebra (CMS HCC)   . 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 Thrombocytopenia (CMS HCC)   . Alcohol abuse      NEURO:  - Hx alcoholism  - S/p trauma with fall down 11 stairs while intoxicated - sedated with propofol and no s/s withdrawal. On multivitamin and thiamine. Phenobarb taper.   - CTH: No acute intracranial findings. Left posterior occipital scalp hematoma  - CT C-spine and lumbar spine negative   - CT thoracic spine: Multiple rib fractures (medial left 4th- 6th and sixth ribs and posterior lateral left 4th-7th . Possible non-displaced left transverse process fractures of T5 and T6   - Analgesic: Fentanyl infusion, Lidoderm patch   - Daily sedation vacation for neuro assessments      RESP:  - Acute respiratory failure with hypoxia ->transferred to ICU on 5/19 for intubation   - CTA chest: small left pneumothorax <5% resolved on follow up the following day. Rt>Lt. Trace left  pleural effusion. Multiple displaced and comminuted left rib fractures. SubQ air left chest wall extending into the left flank  - CT with possible small bilat LL collapse/plugging -  05/05/21 bronch with multiple small casts from BLL - repeat CXR with no change. No cultures obtained at that time.  - DuoNebs every 4 hours as needed  - now with strep pneumo, azithro stopped, unasyn continued and D#5.  - Serial CXRs and ABGs    CVS:  - Hx HTN  - Hypertensive ->resolved while sedated    GI:  - CTA abdomen/ pelvis: Grade 3 splenic injury. Cirrhosis with evidence of portal hypertension  - Repeat CT A/Pon5/20: Stable splenic laceration.  - continue daily H/H's if he bleeds he will require embolization  - NPO  -Tube feeding toleratedand MIVF stopped  -Prophylaxis: IV Pepcid  - Bowel regimen: Colace and Senna    Renal:  - Foley catheter for strict I/O's  - Monitor and correct electrolyte imbalance as needed  - Serial BMPs       Intake/Output Summary (Last 24 hours) at 05/07/2021 0555  Last data filed at 05/07/2021 0518  Gross per 24 hour   Intake 5689.95 ml   Output 1440 ml   Net 4249.95 ml     Net IO Since Admission: 12,090.31 mL [05/07/21 0555]    HEME:  -  No active bleeding noted; H/H stable  - Thrombocytopeniacontinue to trend  - SQ Lovenox andSCD's for prophylaxis   - Serial CBC differential    ID:  - WBC6.5  - Pneumonia -> Unasyn D#5 (Unasyn dose increased 5/21)  - Sputum culture growing Haemophilis and Kelbsiella as of 5/23 and and now with strep pneumoniae.  - Urine strep pneumo and legionellanegative  - Procalcitonin0.10  -Blood cultures NGTD  - UAwith no signs of infective source  - vaccinations ordered, not given yet.     ENDO:  - UUV2.53GUY HA1C5.3  - POCT glucometer Q 6 hours, add SSI as needed for optimal glycemic controland will continue adjust as necessary to achieve optimal BS levels.    Prophylaxis:  -SCDs, Pepcid, Peridex,HOB > 30, pulmonary hygiene(not yet cleared for pharm  DVT by the trauma service.    Code Status:  Full Code     I have personally examined the patient at the bedside; reviewed the medical chart, laboratory data, radiologic imaging and discussed patient's case at bedside with multidisciplinary critical care team. Critical Care excludes any bedside procedures or teaching. Dr. Maurine Minister available for collaboration at all times.     Critical care time excluding billable procedure is 40 minutes.     Georgiana Spinner, APRN-ACNP-BC  05/07/2021, 06:05      CRITICAL CARE ADDENDUM AT 12:51 P.M. ON 05/07/2021 BY DR. Maurine Minister        Patient was seen at 9:05 a.m..  The patient is sedate.  Diprivan is a 50 mics and fentanyl is 100 mics.  I spoke to surgery earlier.  Agree with recommendations regarding the Lasix so therefore Lasix 40 mg IV was given with good urine output ventilator settings are tidal volume 500;  rate of 22;  60%; 5 cm of PEEP.  Breath sounds are decreased at the left base.  There is an ultrasound the chest reveals left pleural effusion which will be drained.  He does have a left arm PICC line in place.    His length of stay input and output balance is 12037 cc. Tube feedings O vital HP at 60 cc/hour.  He had a bowel movement yesterday.  He is on Unasyn.  All extremities are warm.    Waynard Reeds, MD  05/07/2021, 12:55

## 2021-05-07 NOTE — OT Treatment (Signed)
[place]  Occupational Therapy Progress Note    Patient Name: Hector [name]  Date of Birth: 10/12/1961  Height:  182.9 cm (6')  Weight:  104 kg (229 lb 4.5 oz)  Room/Bed: 367/A  Payor: [employer] MEDICAID / Plan: HEALTH PLAN MEDICAID / Product Type: Medicaid [facility] /     Assessment:       05/07/21 0925   Daily Activity AM-PAC/6-clicks Score   Putting on/Taking off clothing on lower body 1   Bathing 1   Toileting 1   Putting on/Taking off clothing on upper body 1   Personal grooming 1   Eating Meals 1   Raw Score Total 6   Standardized (t-scale) Score 17.07   CMS 0-100% Score 100   CMS Modifier CN         Discharge Needs:   Equipment Recommendation: to be determined    Discharge Disposition: LTACH    Plan:   Continue to follow patient according to established plan of care.  The risks/benefits of therapy have been discussed with the patient/caregiver and he/she is in agreement with the established plan of care.     Subjective & Objective:     Sedated, intubated/vent.  FiO2 60, PEEP 5.  OT provided orientation information and PROM x10 minutes BUE within available ranges.  Patient did not arouse or follow command for active movement this morning.  Plan:  will follow and progress as indicated and tolerated.      Pain: No pain faces scale    Level of consciousness sedated/no response    Bathing D  Dressing D  Toileting D  Grooming D  Eating D    Patient education orientation provided.            Therapist:   Landry Corporal, OT     Start Time: [phone]  End Time: 6440  Total Treatment Time: 10 minutes  Charges Entered: TEx1  Department Number: [phone]

## 2021-05-07 NOTE — Progress Notes (Signed)
Name: Hector [name] MRN:  [id number]   Date of admission: 05/01/2021 Age: 60 y.o.       Name - Hector [name]    Date of service - 05/07/2021      Interval history  No acute events overnight.  Patient remains intubated in the ICU.  He is tolerating tube feeds at goal.  Hemodynamically stable.  Afebrile    Physical exam  Filed Vitals:    05/07/21 0600 05/07/21 0630 05/07/21 0700 05/07/21 0800   BP: (!) 154/73 (!) 100/56 (!) 96/53 (!) 96/54   Pulse: 90  81 83   Resp: (!) 23 (!) 22 (!) 22 (!) 22   Temp:   37.1 C (98.8 F)    SpO2: 92%  94% 93%          Intake/Output Summary (Last 24 hours) at 05/07/2021 0831  Last data filed at 05/07/2021 0800  Gross per 24 hour   Intake 5781.31 ml   Output 1415 ml   Net 4366.31 ml        General:Intubated in the ICU with sedation  Abdomen: soft, nontender, nondistended,small umbilical hernia  Skin: No jaundice, lesions, or rashes.  Lungs:On the ventilator   CV: Hemodynamically stable  Extremities:  Patient appears to have fluid overload in all extremities      Labs/Imaging -     XR AP MOBILE CHEST performed on 05/07/2021 6:11 AM    CLINICAL HISTORY: Trauma.  on vent     TECHNIQUE: Frontal view of the chest.    COMPARISON:  Yesterday    FINDINGS:  The support lines and tubes are in stable and satisfactory position.   Cardiac and mediastinal contours are stable.   No significant change in the appearance of the lungs.   Extensive bilateral pulmonary infiltrates persist with a moderate size left pleural effusion.      IMPRESSION:  NO SIGNIFICANT CHANGE SINCE THE PRIOR EXAM.      ASSESSMENT & RECOMMENDATIONS:   59 y.o.malestatus post fall downstairs sustaining multiple left-sided rib fractures with subcutaneous emphysema,transverse process fracture of thoracic spine,and grade 3 splenic laceration  - patient to remain intubated in the ICUfor today  -antibiotics for the developing pneumonia...  Cultures from 05/02/2021 is strep pneumo growth  -continue close  monitoring of his hemoglobin,we will transfuse as needed and likely give platelets if he requires transfusion  -patient is a daily alcoholic with evidence of cirrhosis with portal hypertension and thrombocytopenia.Marland KitchenMarland KitchenIf there is concern about bleeding from his splenic laceration then he will need to be embolized by Interventional Radiology or vascular surgery.Marland KitchenMarland KitchenPatient is an extremely poor surgical candidate  - Lovenox for DVT prophylaxis  - I discussed wanting to consider is Lasix for diuresis, I think our major challenge will be weaning him from the ventilator, patient may benefit from a thoracentesis    Clinton Quant, MD      Note: A portion of this documentation was generated using MMODAL (voice recognition software) and may contain syntax/voice recognition errors.

## 2021-05-08 DIAGNOSIS — R609 Edema, unspecified: Secondary | ICD-10-CM

## 2021-05-08 LAB — BASIC METABOLIC PANEL
BUN/CREA RATIO: 42 — ABNORMAL HIGH (ref 6–20)
BUN: 19 mg/dL (ref 9–20)
CALCIUM: 7.7 mg/dL — ABNORMAL LOW (ref 8.4–10.2)
CHLORIDE: 109 mmol/L — ABNORMAL HIGH (ref 98–107)
CO2 TOTAL: 32 mmol/L — ABNORMAL HIGH (ref 22–30)
CREATININE: 0.45 mg/dL — ABNORMAL LOW (ref 0.66–1.20)
ESTIMATED GFR: >60 mL/min/{1.73_m2} (ref >60)
GLUCOSE: 117 mg/dL — ABNORMAL HIGH (ref 74–106)
POTASSIUM: 3.9 mmol/L (ref 3.5–5.1)
SODIUM: 140 mmol/L (ref 137–145)

## 2021-05-08 LAB — CBC
HCT: 31.8 % — ABNORMAL LOW (ref 36.0–46.0)
HGB: 10.8 g/dL — ABNORMAL LOW (ref 13.9–16.3)
MCH: 34.5 pg — ABNORMAL HIGH (ref 25.4–34.0)
MCHC: 34 g/dL (ref 30.0–37.0)
MCV: 101.4 fL — ABNORMAL HIGH (ref 80.0–100.0)
MPV: 9.9 fL (ref 7.5–11.5)
PLATELETS: 71 10*3/uL — ABNORMAL LOW (ref 130–400)
RBC: 3.14 10*6/uL — ABNORMAL LOW (ref 4.30–5.90)
RDW: 15.8 % — ABNORMAL HIGH (ref 11.5–14.0)
WBC: 3.1 10*3/uL — ABNORMAL LOW (ref 4.5–11.5)

## 2021-05-08 LAB — ARTERIAL BLOOD GAS, CO-OX, LYTES, LACTATE REFLEX
%FIO2 (ARTERIAL): 60 %
(T) PCO2: 46 mm/Hg — ABNORMAL HIGH (ref 35.0–45.0)
(T) PO2: 76 mm/Hg (ref 72.0–100.0)
BASE EXCESS (ARTERIAL): 4.5 mmol/L — ABNORMAL HIGH (ref 0.0–1.0)
BICARBONATE (ARTERIAL): 28.3 mmol/L — ABNORMAL HIGH (ref 18.0–26.0)
CARBOXYHEMOGLOBIN: 1.2 % (ref 0.0–2.5)
CHLORIDE: 107 mmol/L (ref 101–111)
GLUCOSE: 109 mg/dL — ABNORMAL HIGH (ref 60–105)
HEMATOCRITRT: 42 %
HEMOGLOBIN: 13.9 g/dL (ref 12.0–18.0)
IONIZED CALCIUM: 1.15 mmol/L (ref 1.10–1.35)
LACTATE: 0.6 mmol/L (ref 0.0–1.3)
MET-HEMOGLOBIN: 1.8 % (ref 0.0–2.0)
O2CT: 18 % (ref 15.7–24.3)
OXYHEMOGLOBIN: 92 % (ref 85.0–98.0)
PAO2/FIO2 RATIO: 127 (ref <=200)
PCO2 (ARTERIAL): 46 mm/Hg — ABNORMAL HIGH (ref 35–45)
PH (ARTERIAL): 7.42 (ref 7.35–7.45)
PH (T): 7.42 (ref 7.35–7.45)
PO2 (ARTERIAL): 76 mm/Hg (ref 72–100)
SODIUM: 140 mmol/L (ref 137–145)
TEMPERATURE, COMP: 37 C
WHOLE BLOOD POTASSIUM: 3.8 mmol/L (ref 3.5–4.6)

## 2021-05-08 LAB — FLUID CULTURE & GRAM STAIN: GRAM STAIN: NONE SEEN

## 2021-05-08 LAB — MAGNESIUM: MAGNESIUM: 2.3 mg/dL (ref 1.6–2.3)

## 2021-05-08 LAB — PHOSPHORUS: PHOSPHORUS: 4.3 mg/dL (ref 2.5–4.5)

## 2021-05-08 MED ORDER — QUETIAPINE 25 MG TABLET
25.0000 mg | ORAL_TABLET | Freq: Two times a day (BID) | ORAL | Status: DC
Start: 2021-05-08 — End: 2021-05-10
  Administered 2021-05-08 – 2021-05-10 (×4): 25 mg via ORAL
  Filled 2021-05-08 (×4): qty 1

## 2021-05-08 MED ORDER — FUROSEMIDE 10 MG/ML INJECTION SOLUTION
40.0000 mg | Freq: Once | INTRAMUSCULAR | Status: AC
Start: 2021-05-08 — End: 2021-05-08
  Administered 2021-05-08: 40 mg via INTRAVENOUS
  Filled 2021-05-08: qty 4

## 2021-05-08 NOTE — Progress Notes (Signed)
Name: Hector [name] MRN:  [id number]   Date of admission: 05/01/2021 Age: 60 y.o.       Name - Hector [name]    Date of service - 05/08/2021      Interval history  The patient remains intubated in the ICU.  He underwent a thoracentesis of the left chest cavity and 700 cc of fluid was removed.  The postprocedure chest x-ray looked much improved.  The patient does still have a flail segment of rib fractures on the left.  Tolerating tube feeds.  Hemodynamically stable.  Afebrile     Physical exam  Filed Vitals:    05/08/21 0600 05/08/21 0635 05/08/21 0700 05/08/21 0800   BP: (!) 106/58  (!) 108/59 (!) 107/57   Pulse: 68  73 69   Resp: (!) 22  (!) 23 20   Temp:   37.1 C (98.8 F) 36.2 C (97.2 F)   SpO2: 96% 96% 94% 95%          Intake/Output Summary (Last 24 hours) at 05/08/2021 0847  Last data filed at 05/08/2021 0800  Gross per 24 hour   Intake 1931.77 ml   Output 3250 ml   Net -1318.23 ml        General:Intubated in the ICU with sedation  Abdomen: soft, nontender, nondistended,small umbilical hernia  Skin: No jaundice, lesions, or rashes.  Lungs:On the ventilator   CV: Hemodynamically stable  Extremities:  Patient appears to have fluid overload in all extremities      ASSESSMENT & RECOMMENDATIONS:   59 y.o.malestatus post fall downstairs sustaining multiple left-sided rib fractures with subcutaneous emphysema,transverse process fracture of thoracic spine,and grade 3 splenic laceration  - patient to remain intubated in the ICUfor today...  We will attempt vent weaning  -antibiotics for the developing pneumonia.Marland KitchenMarland KitchenCultures from 05/02/2021 is strep pneumo growth  -continue with Lasix diuresis  -Lovenox for DVT prophylaxis  -  continue tube feeding at goal    Clinton Quant, MD      Note: A portion of this documentation was generated using MMODAL (voice recognition software) and may contain syntax/voice recognition errors.

## 2021-05-08 NOTE — Progress Notes (Addendum)
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    Ajamu LEE Tobin    RADIOLOGIST: Dorisann Frames, MD    XR AP MOBILE CHEST performed on 05/07/2021 6:11 AM    CLINICAL HISTORY: Trauma.  on vent     TECHNIQUE: Frontal view of the chest.    COMPARISON:  Yesterday    FINDINGS:  The support lines and tubes are in stable and satisfactory position.   Cardiac and mediastinal contours are stable.   No significant change in the appearance of the lungs.   Extensive bilateral pulmonary infiltrates persist with a moderate size left pleural effusion.        Impression    NO SIGNIFICANT CHANGE SINCE THE PRIOR EXAM.      Radiologist location ID: [id number]     [place] CHEST (EFFUSION)     Status: None  Narrative    Rip LEE Lezcano    RADIOLOGIST: Colletta Maryland, MD    [place] CHEST (EFFUSION) performed on 05/07/2021 11:17 AM    CLINICAL HISTORY: pleural effusions..  check left pleural effusion    TECHNIQUE:  Ultrasound imaging of left chest.    COMPARISON:  None.    FINDINGS:  There is a moderate-sized left pleural effusion.        Impression    LEFT PLEURAL EFFUSION        Radiologist location ID: [id number]     US THORACENTESIS     Status: None    Narrative    *Procedure not read by radiology.    *Please Refer to Procedure Note for result.   XR AP MOBILE CHEST     Status: None    Narrative    Griffyn LEE Sivertsen    RADIOLOGIST: Berton Bon, MD    XR AP MOBILE CHEST performed on  05/07/2021 4:40 PM    CLINICAL HISTORY: POST THORACENTESIS.  s/p left thoracentisis.     TECHNIQUE: Frontal view of the chest.    COMPARISON:  Previous exam from today    FINDINGS:  ET tube 5 cm above the carina. There is an NG tube in the stomach. There is a left PICC catheter in the SVC   Heart size is mildly enlarged.     There are perihilar and basilar infiltrates. Possible small effusions obscuring the diaphragms   There are multiple left lateral rib fracture deformities        Impression    Negative for pneumothorax after thoracentesis      Radiologist location ID: [id number]        All current radiology studies were reviewed.    PHYSICAL EXAMINATION  Temperature: 37.1 C (98.7 F)  Heart Rate: 71  BP (Non-Invasive): (!) 101/55  Respiratory Rate: (!) 22  SpO2: 95 %    General:  Sedated.  No distress.   Skin:   Warm and dry.    HEENT:  Normocephalic.  Oral endotracheal tube and orogastric tube in place.    Neck:  No swelling.    Heart:  Regular rate and rhythm.  No murmurs.    Lungs:  He has bilateral rhonchi.  He has decreased at the left base.    Abdomen:  Positive bowel sounds, soft and nontender.    Extremities:  He does have edema of the extremities.    Neuro:  Sedate.  When the sedation is down, he becomes restless and agitated      VENTILATOR/SETTINGS:       OET= Size 7.5 mm; taped at 26 cm       Mode:  Assist control       Tidal Volume:  500       Ventilator Rate:  22       FI02:  60%       PEEP:  5 cm       Peak air pressure is 26 cm and mean airway pressure 12 sent      Gastric Tube:  Orogastric     Tube Feedings:  Running.  At 60 cc per hour.      PICC LINE:  In place.      INTRAVENOUS INFUSIONS: 1. Diprivan at 50 mics.  2. Fentanyl at 125 mics.    FOLEY CATHETER:  IN PLACE    PROBLEM LIST:   Active Hospital Problems   (*Primary Problem)    Diagnosis   . *Spleen injury   . On mechanically assisted ventilation (CMS HCC)   . Multiple rib fractures   .  Left pulmonary contusion   . Closed fracture of transverse process of thoracic vertebra (CMS HCC)   . 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 Thrombocytopenia (CMS HCC)   . Alcohol abuse        PLAN:  1. Lasix given again today (40 mg at around 12:30 p.m.)  2.  Potassium is being followed.  3. Repeat Lasix tonight at 11:00 p.m. with 40 mg.  4. Tube feedings  5. GI prophylaxis and DVT prophylaxis.  6.  Serial chest x-ray, ABGs, labs.  7.  On antibiotics.  8.  Seroquel ordered today.  9. Spoke to the patient's girlfriend (Vicky) at 8:50 p.m. on 05/08/2021 to update her..  Patellar that is going to be important in that as we optimize him in next 2-3 days, we will then try to wake him up and hopefully he can remain call because restlessness/agitation is going to be counterproductive.  She understands this completely.  I explained to her that there will be another physician here for next 5 days.        CRITICAL CARE TIME IS 45MINUTES.  __________________________________  Corky Crafts, M.D.      Board Certified in Internal Medicine, Pulmonary, and Sleep Medicine

## 2021-05-08 NOTE — OT Treatment (Signed)
[facility]  Occupational Therapy Progress Note    Patient Name: Hector [name]  Date of Birth: Mar 25, 1961  Height:  182.9 cm (6')  Weight:  104 kg (229 lb 4.5 oz)  Room/Bed: 367/A  Payor: HE[employer]AN MEDICAID / Plan: HEALTH PLAN MEDICAID / Product Type: Medicaid MC /     Assessment:       05/08/21 1034   Daily Activity AM-PAC/6-clicks Score   Putting on/Taking off clothing on lower body 1   Bathing 1   Toileting 1   Putting on/Taking off clothing on upper body 1   Personal grooming 1   Eating Meals 1   Raw Score Total 6   Standardized (t-scale) Score 17.07   CMS 0-100% Score 100   CMS Modifier CN       Discharge Needs:   Equipment Recommendation: to be determined    Discharge Disposition: LTACH    Plan:   Continue to follow patient according to established plan of care.  The risks/benefits of therapy have been discussed with the patient/caregiver and he/she is in agreement with the established plan of care.     Subjective & Objective:     Sedated, intubated/vent.  Did not arouse or follow commands for active movement this morning.  OT provided orientation information and PROM BUE within available ranges.  Tolerated without signs of distress/discomfort.    Pain: no pain faces scale    Bathing D  Dressing D  Toileting D  Grooming D  Eating D    Patient education orientation provided.        Therapist:   Landry Corporal, OT     Start Time: 1034  End Time: 1638  Total Treatment Time: 10 minutes  Charges Entered: TEx1  Department Number: [phone]

## 2021-05-08 NOTE — Care Management Notes (Signed)
Patient remains in ICU on the ventilator at this time. Discharge needs to be determined.

## 2021-05-08 NOTE — Progress Notes (Signed)
[facility] is still following patient's progress and are aware he is still intubated. [facility] was consulted to complete a Brief Intervention with the patient due to alcohol use.  We will continue to follow and monitor the patient while he is in-hospital. Follow-up Friday 05/10/2021 to see if we are able to meet with the patient yet.

## 2021-05-08 NOTE — PT Treatment (Signed)
[facility] [place], [place] [address]  [phone]         Physical Therapy Acute Care Daily Treatment Record       Date: 05/08/2021  Time: 1035  Patient's Name: Hector [name]  Date of Birth: 1961/08/12  Room Number: 367/A    Pertinent Information since last Physical Therapy visit :    Remains in ICU, orally intubated and sedated      Precautions / Limitations :    No known precautions / limitations     Weight Bearing status, if applicable :    No Weightbearing restrictions     Subjective :   Orally intubated     Pain Rating :      Comments:    No facial grimacing noted with ex                               Orientation & Level of Orientation :    Unresponsive and Sedated      6 Clicks Score :    1.) Turning from back to side while flat in bed without using a bed rail.    Total assistance (1)  2.) Moving from lying on back to sitting on the side of a flat bed without using bed rails.   Total assistance (1)  3.) Moving to and from a bed to a chair, including a wheelchair.    Total assistance (1)  4.) Standing up for chair using your arms (eg, wheelchair or bedside chair).   Total assistance (1)  5.) Walking in hospital room.   Total assistance (1)  6.) Climbing 3-5 steps with a railing.    Total assistance (1)    6 Clicks Plan: Score <58 - PT consult is appropriate; placement may be necessary at discharge      Treatment :           Sitting Balance:                            Standing Balance:                    Stairs:                            Therapeutic Exercise:  PROM to le's while supine in bed. No response to treatment.                 Therapeutic Activity:             Canalith Repositioning:             Other Treatment Interventions:       Daily Assessment of Status :     Remains in ICU. No response to treatment.     Timed Charge Number of Minutes   Neuromuscular Re-Education     Gait Training     Therapeutic Activity       Therapeutic Exercise   8   Canalith Re-positioning      Other     Total Treatment Time  8     Patient Education :            Interdisciplinary Communication :  Discharge Planning :   Long Term Acute Care (LTAC)    Plan :   Continue Physical Therapy program     Jackie Plum, PT, DPT  05/08/2021, 15:01

## 2021-05-08 NOTE — Care Plan (Signed)
Patient is sedated on the vent. Plan of care update with family.       Problem: Adult Inpatient Plan of Care  Goal: Plan of Care Review  Outcome: Ongoing (see interventions/notes)  Goal: Patient-Specific Goal (Individualized)  Outcome: Ongoing (see interventions/notes)  Goal: Absence of Hospital-Acquired Illness or Injury  Outcome: Ongoing (see interventions/notes)  Intervention: Identify and Manage Fall Risk  Recent Flowsheet Documentation  Taken 05/08/2021 0700 by Toni Arthurs, RN  Safety Promotion/Fall Prevention: fall prevention program maintained  Intervention: Prevent Skin Injury  Recent Flowsheet Documentation  Taken 05/08/2021 0700 by Toni Arthurs, RN  Body Position:   supine   supine, head elevated   turned q 2 hours   heels elevated off mattress  Intervention: Prevent and Manage VTE (Venous Thromboembolism) Risk  Recent Flowsheet Documentation  Taken 05/08/2021 0700 by Toni Arthurs, RN  VTE Prevention/Management:   anticoagulant therapy maintained   compression stockings on  Intervention: Prevent Infection  Recent Flowsheet Documentation  Taken 05/08/2021 0700 by Toni Arthurs, RN  Infection Prevention: environmental surveillance performed  Goal: Optimal Comfort and Wellbeing  Outcome: Ongoing (see interventions/notes)  Goal: Rounds/Family Conference  Outcome: Ongoing (see interventions/notes)     Problem: Fall Injury Risk  Goal: Absence of Fall and Fall-Related Injury  Outcome: Ongoing (see interventions/notes)  Intervention: Promote Injury-Free Environment  Recent Flowsheet Documentation  Taken 05/08/2021 0700 by Toni Arthurs, RN  Safety Promotion/Fall Prevention: fall prevention program maintained     Problem: Pain Acute  Goal: Acceptable Pain Control and Functional Ability  Outcome: Ongoing (see interventions/notes)     Problem: Communication Impairment (Mechanical Ventilation, Invasive)  Goal: Effective Communication  Outcome: Ongoing (see interventions/notes)     Problem: Device-Related  Complication Risk (Mechanical Ventilation, Invasive)  Goal: Optimal Device Function  Outcome: Ongoing (see interventions/notes)  Intervention: Optimize Device Care and Function  Recent Flowsheet Documentation  Taken 05/08/2021 1100 by Toni Arthurs, RN  Airway Safety Measures:   manual resuscitator at bedside   mask at bedside  Taken 05/08/2021 0700 by Toni Arthurs, RN  Airway Safety Measures:   manual resuscitator at bedside   mask at bedside     Problem: Inability to Wean (Mechanical Ventilation, Invasive)  Goal: Mechanical Ventilation Liberation  Outcome: Ongoing (see interventions/notes)     Problem: Nutrition Impairment (Mechanical Ventilation, Invasive)  Goal: Optimal Nutrition Delivery  Outcome: Ongoing (see interventions/notes)     Problem: Skin and Tissue Injury (Mechanical Ventilation, Invasive)  Goal: Absence of Device-Related Skin and Tissue Injury  Outcome: Ongoing (see interventions/notes)     Problem: Ventilator-Induced Lung Injury (Mechanical Ventilation, Invasive)  Goal: Absence of Ventilator-Induced Lung Injury  Outcome: Ongoing (see interventions/notes)  Intervention: Prevent Ventilator-Associated Pneumonia  Recent Flowsheet Documentation  Taken 05/08/2021 1100 by Toni Arthurs, RN  VAP Prevention Bundle:   HOB elevation maintained   oral care regularly provided   oral care with chlorhexidine   stress ulcer prophylaxis provided   vent circuit breaks minimized   VTE prophylaxis provided  VAP Prevention Measures: completed  Taken 05/08/2021 0700 by Toni Arthurs, RN  VAP Prevention Bundle:   HOB elevation maintained   oral care regularly provided   oral care with chlorhexidine   stress ulcer prophylaxis provided   vent circuit breaks minimized   VTE prophylaxis provided  Oral Care:   lip lubricant applied   oral care provided  Head of Bed [facility]) Positioning: 30 degrees  VAP Prevention Contraindications: VTE prophylaxis contraindicated (describe)  VAP Prevention Measures: completed     Problem:  Non-violent/Non-Self Destructive Restraints  Goal: Alternative methods tried prior to restraints  Outcome: Ongoing (see interventions/notes)  Goal: Patient free from injury and discomfort  Outcome: Ongoing (see interventions/notes)  Goal: Autonomy maintained at the highest possible level  Outcome: Ongoing (see interventions/notes)  Goal: Need for restraints reassessed per policy  Outcome: Ongoing (see interventions/notes)  Goal: Patient education provided  Outcome: Ongoing (see interventions/notes)

## 2021-05-08 NOTE — Procedures (Signed)
Thoracentesis    SURGEON:  Corky Crafts, M.D.    INDICATIONS:  LEFT PLEURAL EFFUSION    PREOPERATIVE DIAGNOSIS:  1.     LEFT PLEURAL EFFUSION  2.     ACUTE RESPIRATORY FAILURE  3.     PNEUMONIA WITH STREPTOCOCCUS  4.     RIB FRACTURES    POSTOPERATIVE DIAGNOSIS:  1.     SAME AS ABOVE  2.     BLOODY LEFT PLEURAL EFFUSION      OPERATION:  ULTRASOUND-GUIDED LEFT THORACENTESIS     START TIME:  3:15 P.M.    WHAT WAS DONE:  Patient does not have a designated medical power of attorney the procedure was deemed necessary to help improve his ventilation status/oxygenation status.   The patient was placed with his right side down, left side up and the head of the bed elevated about 35 and then the ultrasound machine was utilized to identify the appropriate costal space.  The area was prepped and draped in the sterile fashion. The area was anesthetized with 20 mL of 1% Xylocaine. I then made a stab wound in the skin. I then inserted a thoracentesis catheter.  With the 1st catheter, I was not able to aspirate fluid because the tip of the catheter was a bent.  I had to remove that and then inserted a new thoracentesis catheter.  Once fluid was aspirated, I advanced the catheter while removing the trocar.    FLUID SENT FOR:  1. Gram stain and culture  2. LDH, protein and glucose        Chest x-ray was performed.  There is no pneumothorax.  There is better expansion of the left lung.      ________________________________________  Corky Crafts, M.D.      Board Certified in Internal Medicine, Pulmonary, and Sleep Medicine

## 2021-05-09 ENCOUNTER — Inpatient Hospital Stay (HOSPITAL_COMMUNITY): Payer: MEDICAID

## 2021-05-09 ENCOUNTER — Other Ambulatory Visit: Payer: Self-pay

## 2021-05-09 DIAGNOSIS — I350 Nonrheumatic aortic (valve) stenosis: Secondary | ICD-10-CM

## 2021-05-09 DIAGNOSIS — I10 Essential (primary) hypertension: Secondary | ICD-10-CM

## 2021-05-09 DIAGNOSIS — S27321A Contusion of lung, unilateral, initial encounter: Secondary | ICD-10-CM

## 2021-05-09 DIAGNOSIS — R918 Other nonspecific abnormal finding of lung field: Secondary | ICD-10-CM

## 2021-05-09 DIAGNOSIS — B953 Streptococcus pneumoniae as the cause of diseases classified elsewhere: Secondary | ICD-10-CM

## 2021-05-09 LAB — ARTERIAL BLOOD GAS, CO-OX, LYTES, LACTATE REFLEX
%FIO2 (ARTERIAL): 60 %
(T) PCO2: 49 mm/Hg — ABNORMAL HIGH (ref 35.0–45.0)
(T) PO2: 67 mm/Hg — ABNORMAL LOW (ref 72.0–100.0)
BASE EXCESS (ARTERIAL): 8 mmol/L — ABNORMAL HIGH (ref 0.0–1.0)
BICARBONATE (ARTERIAL): 31.1 mmol/L — ABNORMAL HIGH (ref 18.0–26.0)
CARBOXYHEMOGLOBIN: 1.4 % (ref 0.0–2.5)
CHLORIDE: 108 mmol/L (ref 101–111)
GLUCOSE: 94 mg/dL (ref 60–105)
HEMATOCRITRT: 34 %
HEMOGLOBIN: 11.4 g/dL — ABNORMAL LOW (ref 12.0–18.0)
IONIZED CALCIUM: 1.14 mmol/L (ref 1.10–1.35)
LACTATE: 0.6 mmol/L (ref 0.0–1.3)
MET-HEMOGLOBIN: 1.1 % (ref 0.0–2.0)
O2CT: 14.8 % — ABNORMAL LOW (ref 15.7–24.3)
OXYHEMOGLOBIN: 91.8 % (ref 85.0–98.0)
PAO2/FIO2 RATIO: 112 (ref <=200)
PCO2 (ARTERIAL): 49 mm/Hg — ABNORMAL HIGH (ref 35–45)
PH (ARTERIAL): 7.44 (ref 7.35–7.45)
PH (T): 7.44 (ref 7.35–7.45)
PO2 (ARTERIAL): 67 mm/Hg — ABNORMAL LOW (ref 72–100)
SODIUM: 142 mmol/L (ref 137–145)
TEMPERATURE, COMP: 37 C
WHOLE BLOOD POTASSIUM: 3.7 mmol/L (ref 3.5–4.6)

## 2021-05-09 LAB — CBC
HCT: 31.1 % — ABNORMAL LOW (ref 36.0–46.0)
HGB: 10.7 g/dL — ABNORMAL LOW (ref 13.9–16.3)
MCH: 35.2 pg — ABNORMAL HIGH (ref 25.4–34.0)
MCHC: 34.6 g/dL (ref 30.0–37.0)
MCV: 101.8 fL — ABNORMAL HIGH (ref 80.0–100.0)
MPV: 10.1 fL (ref 7.5–11.5)
PLATELETS: 83 10*3/uL — ABNORMAL LOW (ref 130–400)
RBC: 3.05 10*6/uL — ABNORMAL LOW (ref 4.30–5.90)
RDW: 15.8 % — ABNORMAL HIGH (ref 11.5–14.0)
WBC: 3.7 10*3/uL — ABNORMAL LOW (ref 4.5–11.5)

## 2021-05-09 LAB — BASIC METABOLIC PANEL
ANION GAP: 2 mmol/L — ABNORMAL LOW (ref 5–19)
BUN/CREA RATIO: 46 — ABNORMAL HIGH (ref 6–20)
BUN: 22 mg/dL — ABNORMAL HIGH (ref 9–20)
CALCIUM: 7.4 mg/dL — ABNORMAL LOW (ref 8.4–10.2)
CHLORIDE: 107 mmol/L (ref 98–107)
CO2 TOTAL: 29 mmol/L (ref 22–30)
CREATININE: 0.48 mg/dL — ABNORMAL LOW (ref 0.66–1.20)
ESTIMATED GFR: >60 mL/min/{1.73_m2} (ref >60)
GLUCOSE: 99 mg/dL (ref 74–106)
POTASSIUM: 3.8 mmol/L (ref 3.5–5.1)
SODIUM: 138 mmol/L (ref 137–145)

## 2021-05-09 LAB — MAGNESIUM: MAGNESIUM: 2 mg/dL (ref 1.6–2.3)

## 2021-05-09 LAB — PHOSPHORUS: PHOSPHORUS: 4.4 mg/dL (ref 2.5–4.5)

## 2021-05-09 MED ORDER — FUROSEMIDE 10 MG/ML INJECTION SOLUTION
20.0000 mg | Freq: Three times a day (TID) | INTRAMUSCULAR | Status: DC
Start: 2021-05-09 — End: 2021-05-10
  Administered 2021-05-09 – 2021-05-10 (×3): 20 mg via INTRAVENOUS
  Filled 2021-05-09 (×3): qty 2

## 2021-05-09 MED ORDER — OXAZEPAM 15 MG CAPSULE
15.0000 mg | ORAL_CAPSULE | Freq: Three times a day (TID) | ORAL | Status: DC
Start: 2021-05-09 — End: 2021-05-10
  Administered 2021-05-09 – 2021-05-10 (×2): 15 mg via ORAL
  Filled 2021-05-09 (×2): qty 1

## 2021-05-09 NOTE — OT Treatment (Signed)
[facility]  Occupational Therapy Progress Note    Patient Name: Hector [name]  Date of Birth: 1961-02-09  Height:  182.9 cm (6')  Weight:  104 kg (229 lb 4.5 oz)  Room/Bed: 367/A  Payor: HEALTH PLAN MEDICAID / Plan: HEALTH PLAN MEDICAID / Product Type: Medicaid [facility] /     Assessment:       05/09/21 0905   Daily Activity AM-PAC/6-clicks Score   Putting on/Taking off clothing on lower body 1   Bathing 1   Toileting 1   Putting on/Taking off clothing on upper body 1   Personal grooming 1   Eating Meals 1   Raw Score Total 6   Standardized (t-scale) Score 17.07   CMS 0-100% Score 100   CMS Modifier CN         Discharge Needs:   Equipment Recommendation: to be determined    Discharge Disposition: LTACH    Plan:   Continue to follow patient according to established plan of care.  The risks/benefits of therapy have been discussed with the patient/caregiver and he/she is in agreement with the established plan of care.     Subjective & Objective:     Sedated, intubated/vent.  FiO2 60, PEEP 5. Did not arouse or follow command for active movement this morning.  OT provided orientation information and PROM bilateral upper extremities within available ranges. Tolerated without signs of distress/discomfort.  Plan:  will follow and progress as indicated and tolerated.    Pain: No pain faces scale    Bathing D  Dressing D  Toileting D  Grooming D  Eating D    Patient education orientation provided.            Therapist:   Landry Corporal, OT     Start Time: [id number]  End Time: 0917  Total Treatment Time: 12 minutes  Charges Entered: TEx1  Department Number: [phone]

## 2021-05-09 NOTE — Progress Notes (Signed)
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 Colletta Maryland, MD    [place] CHEST (EFFUSION) performed on 05/07/2021 11:17 AM    CLINICAL HISTORY: pleural effusions..  check left pleural effusion    TECHNIQUE:  Ultrasound imaging of left chest.    COMPARISON:  None.    FINDINGS:  There is a moderate-sized left pleural effusion.        Impression    LEFT PLEURAL EFFUSION        Radiologist location ID: [id number]     US THORACENTESIS     Status: None    Narrative    *Procedure not read by radiology.    *Please Refer to Procedure Note for result.   XR AP MOBILE CHEST     Status: None    Narrative  Ules LEE Lizarraga    RADIOLOGIST: Berton Bon, MD    XR AP MOBILE CHEST performed on 05/07/2021 4:40 PM    CLINICAL HISTORY: POST THORACENTESIS.  s/p left thoracentisis.     TECHNIQUE: Frontal view of the chest.    COMPARISON:  Previous exam from today    FINDINGS:  ET tube 5 cm above the carina. There is an NG tube in the stomach. There is a left PICC catheter in the SVC   Heart size is mildly enlarged.     There are perihilar and basilar infiltrates. Possible small effusions obscuring the diaphragms   There are multiple left lateral rib fracture deformities        Impression    Negative for pneumothorax after thoracentesis      Radiologist location ID: [id number]     XR AP MOBILE CHEST     Status: None    Narrative    Tino LEE Macwilliams    RADIOLOGIST: Otho Najjar, MD    XR AP MOBILE CHEST performed on 05/09/2021 6:15 AM    CLINICAL HISTORY: Mult. L rib fx.s, effusion, intubated.  resp fail-vent, effusion, multiple rib fx's    TECHNIQUE: Frontal view of the chest.    COMPARISON:  05/07/2021    FINDINGS:  The support lines and tubes are in stable and satisfactory position. No pneumothorax.     There is increasing opacity bilaterally in the mid lung fields with infiltrate, effusion and vascular congestion. This could be inflammatory but the symmetry suggests development of pulmonary edema. Clinical correlation recommended.      Impression    Increasing opacity bilaterally suggesting increasing CHF. Clinical correlation recommended.      Radiologist location ID: [id number]        All current radiology studies were reviewed.    PHYSICAL EXAMINATION  Temperature: 37.4 C (99.3 F)  Heart Rate: 80  BP (Non-Invasive): (!) 102/57  Respiratory Rate: (!) 22  SpO2: 94 %    General: 60yo male sedated and maintained on MV   HEENT: Normocephalic.  Oral endotracheal tube and orogastric tube in place  Neck: Trach midline, supple   Heart: Sinus rhythm, HR 80's. 1+ edema. Pulses 2+. Brisk capillary refill   Lungs: Maintained on mechanical ventilation. Coarse breath sounds throughout.   Abdomen: Soft, round, non-tender on palpation, BS normoactive, OGT with tube feedings infusing   Renal: Foley catheter draining amber urine to gravity   Extremities: 1+ edema noted to extremities   Skin: Warm and dry  Neuro: Sedated with current RASS -5. Will decrease sedation for goal RASS 0 to -1. PERRL  Psych: Sedated     VENTILATOR/SETTINGS:       OET= Size 7.5 mm; taped at 26 cm       Mode:  Assist control       Tidal Volume:  500       Ventilator Rate:  22       FI02:  60%       PEEP:  5 cm       Peak air pressure is 26 cm and mean airway pressure 12 sent      PROBLEM LIST:   Active Hospital Problems   (*Primary Problem)    Diagnosis   . *Spleen injury   . On mechanically assisted ventilation (CMS  HCC)   . Multiple rib fractures   . Left pulmonary contusion   . Closed fracture of transverse process of thoracic vertebra (CMS HCC)   .  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 Thrombocytopenia (CMS HCC)   . Alcohol abuse      NEURO:  - Hx alcoholism  - S/p trauma with fall down 11 stairs while intoxicated   - Sedated with propofol and no s/s withdrawal. On multivitamin and thiamine.   - CTH: No acute intracranial findings. Left posterior occipital scalp hematoma  - CT C-spine and lumbar spine negative   - CT thoracic spine: Multiple rib fractures (medial left 4th- 6th and sixth ribs and posterior lateral left 4th-7th . Possible non-displaced left transverse process fractures of T5 and T6   - Analgesic: Fentanyl infusion, Lidoderm patch   - Continue Seroquel -> Serax added 5/26  - Daily sedation vacation for neuro assessments    RESP:  - Acute respiratory failure with hypoxia ->transferred to ICU on 5/19 for intubation   - CTA chest: small left pneumothorax <5% resolved on follow up the following day. Rt>Lt. Trace left pleural effusion. Multiple displaced and comminuted left rib fractures. SubQ air left chest wall extending into the left flank  - CT with possible small bilat LL collapse/plugging - 05/05/21 bronch with multiple small casts from BLL - repeat CXR with no change. No cultures obtained at that time.  - DuoNebs every 4 hours as needed  - Strep pneumo, azithro stopped, unasyn continued and D#6  - Serial CXRs and ABGs    CVS:  - Hx HTN  - Hypertensive ->resolved while sedated  - Routine hemodynamic monitoring and continuous telemetry    GI:  - CTA abdomen/ pelvis: Grade 3 splenic injury. Cirrhosis with evidence of portal hypertension  - Repeat CT A/Pon5/20: Stable splenic laceration  - Continue daily H/H's if he bleeds he will require embolization  - NPO  -Tube feeding, tolerating   -Prophylaxis: IV Pepcid  - Bowel regimen: Colace and Senna    Renal:  - Lasix 20 mg TID  - Foley catheter  for strict I/O's  - Monitor and correct electrolyte imbalance as needed  - Serial BMPs    HEME:  - No active bleeding noted; H/H stable  - Thrombocytopeniacontinue to trend  - SQ Lovenox andSCD's for prophylaxis  - Serial CBC differential    ID:  - WBC3.7  - Pneumonia -> Unasyn D#6 (Unasyn dose increased 5/21)  - Sputum culture growing Haemophilis and Kelbsiella as of 5/23 and and now with strep pneumoniae  - Urine strep pneumo and legionellanegative  - Procalcitonin0.10  -Blood cultures NGTD  - UAwith no signs of infective source  - Vaccinations ordered, not given yet    ENDO:  - TSH4.13and HA1C5.3  - POCT glucometer Q 6 hours, add SSI as needed for optimal glycemic controland will continue adjust as necessary to achieve optimal BS levels    Prophylaxis:  -Lovenox, SCDs, Pepcid, Peridex,HOB > 30, pulmonary hygiene    Code Status:  Full Code     Critical Care Time: 40 minutes    I have personally examined the patient at the bedside; reviewed the medical chart, laboratory data, radiologic imaging and discussed patient's case at bedside with multidisciplinary critical care team. Critical Care excludes any bedside procedures or teaching. Dr. Einar Gip available for collaboration at all times.    Letta Median, APRN

## 2021-05-09 NOTE — Nurses Notes (Signed)
Pt stable throughout night. Pt was not tolerating stimulation such as turns or suctioning. Pt would fight ventilator and hold breath and desat down to low 80's. Diprivan drip maxed out and Fentanyl drip titrating according to CPOT. BP 110/60   Pulse 84   Temp 37.6 C (99.7 F)   Resp (!) 29   Ht 1.829 m (6')   Wt 104 kg (229 lb 4.5 oz)   SpO2 94%   BMI 31.10 kg/m   Cresenciano Lick, RN  05/09/2021, 07:30

## 2021-05-09 NOTE — PT Treatment (Signed)
[facility], [place] [address]  [phone]         Physical Therapy Acute Care Daily Treatment Record       Date: 05/09/2021  Time: 1030  Patient's Name: Hector [name]  Date of Birth: [birth year]  Room Number: 367/A    Pertinent Information since last Physical Therapy visit :    Remains orally intubated     Precautions / Limitations :    No known precautions / limitations     Weight Bearing status, if applicable :    No Weightbearing restrictions     Subjective :   Orally intubated     Pain Rating :      Comments:         No facial grimacing noted with thera ex                          Orientation & Level of Orientation :    Unresponsive and Sedated      6 Clicks Score :    1.) Turning from back to side while flat in bed without using a bed rail.    Total assistance (1)  2.) Moving from lying on back to sitting on the side of a flat bed without using bed rails.   Total assistance (1)  3.) Moving to and from a bed to a chair, including a wheelchair.    Total assistance (1)  4.) Standing up for chair using your arms (eg, wheelchair or bedside chair).   Total assistance (1)  5.) Walking in hospital room.   Total assistance (1)  6.) Climbing 3-5 steps with a railing.    Total assistance (1)    6 Clicks Plan: Score <85 - PT consult is appropriate; placement may be necessary at discharge      Treatment :         Sitting Balance:                            Standing Balance:                    Stairs:                            Therapeutic Exercise:    PROM to le's while supine in bed. No response to treatment.               Therapeutic Activity:             Canalith Repositioning:             Other Treatment Interventions:       Daily Assessment of Status :    Remains intubated. No response to treatment.       Timed Charge Number of Minutes   Neuromuscular Re-Education     Gait Training      Therapeutic Activity      Therapeutic Exercise    8   Canalith Re-positioning      Other     Total Treatment Time  8     Patient Education :            Interdisciplinary Communication :                Discharge Planning :  Long Term Acute Care (LTAC)    Plan :   Continue Physical Therapy program     Jackie Plum, PT, DPT  05/09/2021, 11:40

## 2021-05-09 NOTE — Progress Notes (Signed)
Name: Hector [name] MRN:  [id number]   Date of admission: 05/01/2021 Age: 60 y.o.       Name - Hector [name]    Date of service - 05/09/2021      Interval history  No acute events overnight.  Patient remains intubated in the ICU.  Tolerating tube feedings.  Afebrile.  Hemodynamically stable    Physical exam  Filed Vitals:    05/09/21 0400 05/09/21 0500 05/09/21 0600 05/09/21 0654   BP: (!) 102/56 (!) 104/59 110/60    Pulse: 85 87 84    Resp: (!) 21 (!) 21 (!) 29    Temp:  37.6 C (99.7 F)     SpO2: 94% 92% 93% 94%          Intake/Output Summary (Last 24 hours) at 05/09/2021 0756  Last data filed at 05/09/2021 0600  Gross per 24 hour   Intake 2743.1 ml   Output 3520 ml   Net -776.9 ml        General:Intubated in the ICU with sedation  Abdomen: soft, nontender, nondistended,small umbilical hernia  Skin: No jaundice, lesions, or rashes.  Lungs:On the ventilator   CV: Hemodynamically stable  Extremities:Patient appears to have fluid overload in all extremities      Labs/Imaging -     XR AP MOBILE CHEST performed on 05/09/2021 6:15 AM    CLINICAL HISTORY: Mult. L rib fx.s, effusion, intubated.  resp fail-vent, effusion, multiple rib fx's    TECHNIQUE: Frontal view of the chest.    COMPARISON:  05/07/2021    FINDINGS:  The support lines and tubes are in stable and satisfactory position. No pneumothorax.     There is increasing opacity bilaterally in the mid lung fields with infiltrate, effusion and vascular congestion. This could be inflammatory but the symmetry suggests development of pulmonary edema. Clinical correlation recommended.    IMPRESSION:  Increasing opacity bilaterally suggesting increasing CHF. Clinical correlation recommended.      ASSESSMENT & RECOMMENDATIONS:   60 y.o.malestatus post fall downstairs sustaining multiple left-sided rib fractures with subcutaneous emphysema,transverse process fracture of thoracic spine,and grade 3 splenic laceration  - patient to remain intubated in  the ICUfor today...  We will attempt vent weaning  -antibiotics (Unasyn) for the developing pneumonia.Marland KitchenMarland KitchenCultures from 05/02/2021 is strep pneumo growth  -continue with Lasix diuresis  -Lovenox for DVT prophylaxis  - continue tube feeding at goal  - the patient's chest x-ray continues to show vascular congestion so something to be considered is echocardiogram to see the function of the heart, patient may need to stay on Lasix      Clinton Quant, MD      Note: A portion of this documentation was generated using MMODAL (voice recognition software) and may contain syntax/voice recognition errors.

## 2021-05-09 NOTE — Care Plan (Signed)
Pt remains in ICU requiring ventilator support, this a.m. propofol was cut in half for sedation break pt HR increased began to clamp down on his bite block and sedation resumed at previous rate. Lasix 20 received and at this point pt has diuresed 850 ml' s'. Will continue to monitor pt   Problem: Adult Inpatient Plan of Care  Goal: Plan of Care Review  Outcome: Ongoing (see interventions/notes)  Flowsheets (Taken 05/09/2021 0800)  Plan of Care Reviewed With: patient  Goal: Patient-Specific Goal (Individualized)  Outcome: Ongoing (see interventions/notes)  Flowsheets  Taken 05/09/2021 1200  Patient-Specific Goals (Include Timeframe): Maintain hemodynamic stability  Taken 05/09/2021 0800  Individualized Care Needs: turn q 2 hours, monitor s/s of pain  Anxieties, Fears or Concerns: UTA  Patient-Specific Goals (Include Timeframe): Maintain hemodynamic stability  Goal: Absence of Hospital-Acquired Illness or Injury  Outcome: Ongoing (see interventions/notes)  Intervention: Identify and Manage Fall Risk  Recent Flowsheet Documentation  Taken 05/09/2021 1200 by Joana Reamer, RN  Safety Promotion/Fall Prevention:   fall prevention program maintained   safety round/check completed  Taken 05/09/2021 0800 by Joana Reamer, RN  Safety Promotion/Fall Prevention:   fall prevention program maintained   safety round/check completed  Intervention: Prevent Skin Injury  Recent Flowsheet Documentation  Taken 05/09/2021 1200 by Joana Reamer, RN  Body Position:   side lying, right   positioned with 2 assist  Taken 05/09/2021 0800 by Joana Reamer, RN  Body Position:   side lying, left   lower extremity elevated, left   lower extremity elevated, right  Intervention: Prevent and Manage VTE (Venous Thromboembolism) Risk  Recent Flowsheet Documentation  Taken 05/09/2021 1200 by Joana Reamer, RN  VTE Prevention/Management: foot pump device on  Taken 05/09/2021 0800 by Joana Reamer, RN  VTE Prevention/Management:   foot pump device on   dorsiflexion/plantar flexion  performed  Intervention: Prevent Infection  Recent Flowsheet Documentation  Taken 05/09/2021 1200 by Joana Reamer, RN  Infection Prevention: environmental surveillance performed  Taken 05/09/2021 0800 by Joana Reamer, RN  Infection Prevention: environmental surveillance performed  Goal: Optimal Comfort and Wellbeing  Outcome: Ongoing (see interventions/notes)  Intervention: Provide Person-Centered Care  Recent Flowsheet Documentation  Taken 05/09/2021 1200 by Joana Reamer, [place] Relationship/Rapport:   care explained   choices provided   emotional support provided   reassurance provided  Taken 05/09/2021 0800 by Joana Reamer, [place] Relationship/Rapport:   care explained   reassurance provided  Goal: Rounds/Family Conference  Outcome: Ongoing (see interventions/notes)     Problem: Fall Injury Risk  Goal: Absence of Fall and Fall-Related Injury  Outcome: Ongoing (see interventions/notes)  Intervention: Identify and Manage Contributors  Recent Flowsheet Documentation  Taken 05/09/2021 0800 by Joana Reamer, RN  Self-Care Promotion: BADL personal routines maintained  Intervention: Promote Injury-Free Environment  Recent Flowsheet Documentation  Taken 05/09/2021 1200 by Joana Reamer, RN  Safety Promotion/Fall Prevention:   fall prevention program maintained   safety round/check completed  Taken 05/09/2021 0800 by Joana Reamer, RN  Safety Promotion/Fall Prevention:   fall prevention program maintained   safety round/check completed     Problem: Pain Acute  Goal: Acceptable Pain Control and Functional Ability  Outcome: Ongoing (see interventions/notes)  Intervention: Optimize Psychosocial Wellbeing  Recent Flowsheet Documentation  Taken 05/09/2021 1200 by Joana Reamer, RN  Supportive Measures: relaxation techniques promoted  Taken 05/09/2021 0800 by Joana Reamer, RN  Diversional Activities: television  Supportive Measures: relaxation techniques promoted     Problem: Communication Impairment (Mechanical Ventilation,  Invasive)  Goal: Effective  Communication  Outcome: Ongoing (see interventions/notes)     Problem: Device-Related Complication Risk (Mechanical Ventilation, Invasive)  Goal: Optimal Device Function  Outcome: Ongoing (see interventions/notes)  Intervention: Optimize Device Care and Function  Recent Flowsheet Documentation  Taken 05/09/2021 1200 by Joana Reamer, RN  Airway Safety Measures: manual resuscitator at bedside  Taken 05/09/2021 0800 by Joana Reamer, RN  Airway Safety Measures: manual resuscitator at bedside     Problem: Inability to Wean (Mechanical Ventilation, Invasive)  Goal: Mechanical Ventilation Liberation  Outcome: Ongoing (see interventions/notes)     Problem: Nutrition Impairment (Mechanical Ventilation, Invasive)  Goal: Optimal Nutrition Delivery  Outcome: Ongoing (see interventions/notes)     Problem: Skin and Tissue Injury (Mechanical Ventilation, Invasive)  Goal: Absence of Device-Related Skin and Tissue Injury  Outcome: Ongoing (see interventions/notes)     Problem: Ventilator-Induced Lung Injury (Mechanical Ventilation, Invasive)  Goal: Absence of Ventilator-Induced Lung Injury  Outcome: Ongoing (see interventions/notes)  Intervention: Prevent Ventilator-Associated Pneumonia  Recent Flowsheet Documentation  Taken 05/09/2021 1200 by Joana Reamer, RN  VAP Prevention Bundle:   HOB elevation maintained   oral care regularly provided   oral care with chlorhexidine   readiness to extubate assessed   sedation interruption performed   stress ulcer prophylaxis provided   vent circuit breaks minimized   VTE prophylaxis provided  Oral Care: oral care with Chlorhexidine provided  Head of Bed [facility]) Positioning: 30 degrees  VAP Prevention Contraindications: VTE prophylaxis contraindicated (describe)  VAP Prevention Measures: completed  Taken 05/09/2021 0800 by Joana Reamer, RN  VAP Prevention Bundle:   HOB elevation maintained   oral care regularly provided   oral care with chlorhexidine   readiness to extubate assessed   sedation interruption  performed   stress ulcer prophylaxis provided   vent circuit breaks minimized   VTE prophylaxis provided  Oral Care: oral care with Chlorhexidine provided  Head of Bed (HOB) Positioning: HOB at 30-45 degrees  VAP Prevention Contraindications: VTE prophylaxis contraindicated (describe)  VAP Prevention Measures: completed     Problem: Non-violent/Non-Self Destructive Restraints  Goal: Alternative methods tried prior to restraints  Outcome: Ongoing (see interventions/notes)  Goal: Patient free from injury and discomfort  Outcome: Ongoing (see interventions/notes)  Goal: Autonomy maintained at the highest possible level  Outcome: Ongoing (see interventions/notes)  Goal: Need for restraints reassessed per policy  Outcome: Ongoing (see interventions/notes)  Goal: Patient education provided  Outcome: Ongoing (see interventions/notes)  Goal: Problem Interventions  Outcome: Ongoing (see interventions/notes)     Problem: Bleeding (Orthopaedic Fracture)  Goal: Absence of Bleeding  Outcome: Ongoing (see interventions/notes)     Problem: Embolism (Orthopaedic Fracture)  Goal: Absence of Embolism Signs and Symptoms  Outcome: Ongoing (see interventions/notes)  Intervention: Prevent or Manage Embolism Risk  Recent Flowsheet Documentation  Taken 05/09/2021 1200 by Joana Reamer, RN  VTE Prevention/Management: foot pump device on  Taken 05/09/2021 0800 by Joana Reamer, RN  VTE Prevention/Management:   foot pump device on   dorsiflexion/plantar flexion performed     Problem: Fracture Stabilization and Management (Orthopaedic Fracture)  Goal: Fracture Stability  Outcome: Ongoing (see interventions/notes)     Problem: Functional Ability Impaired (Orthopaedic Fracture)  Goal: Optimal Functional Ability  Outcome: Ongoing (see interventions/notes)  Intervention: Optimize Functional Ability  Recent Flowsheet Documentation  Taken 05/09/2021 1200 by Joana Reamer, RN  Activity Management: activity adjusted per tolerance  Positioning/Transfer Devices:    in use   pillows  Taken 05/09/2021 0800 by Joana Reamer, RN  Self-Care Promotion: BADL personal routines maintained  Activity Management:   activity adjusted per tolerance   bedrest  Positioning/Transfer Devices:   in use   pillows  Range of Motion: ROM (range of motion) performed     Problem: Infection (Orthopaedic Fracture)  Goal: Absence of Infection Signs and Symptoms  Outcome: Ongoing (see interventions/notes)  Intervention: Prevent or Manage Infection  Recent Flowsheet Documentation  Taken 05/09/2021 1200 by Joana Reamer, RN  Fever Reduction/Comfort Measures:   lightweight clothing   lightweight bedding  Infection Prevention: environmental surveillance performed  Taken 05/09/2021 0800 by Joana Reamer, RN  Fever Reduction/Comfort Measures:   lightweight clothing   lightweight bedding   room temperature adjusted  Infection Prevention: environmental surveillance performed     Problem: Neurovascular Compromise (Orthopaedic Fracture)  Goal: Effective Tissue Perfusion  Outcome: Ongoing (see interventions/notes)     Problem: Pain (Orthopaedic Fracture)  Goal: Acceptable Pain Control  Outcome: Ongoing (see interventions/notes)  Intervention: Manage Acute Orthopaedic-Related Pain  Recent Flowsheet Documentation  Taken 05/09/2021 0800 by Joana Reamer, RN  Diversional Activities: television     Problem: Respiratory Compromise (Orthopaedic Fracture)  Goal: Effective Oxygenation and Ventilation  Outcome: Ongoing (see interventions/notes)  Intervention: Promote Airway Secretion Clearance  Recent Flowsheet Documentation  Taken 05/09/2021 1200 by Joana Reamer, RN  Cough And Deep Breathing: unable to perform  Taken 05/09/2021 0800 by Joana Reamer, RN  Cough And Deep Breathing: unable to perform     Problem: ARDS (Acute Respiratory Distress Syndrome)  Goal: Effective Oxygenation  Outcome: Ongoing (see interventions/notes)     Problem: Adjustment to Injury (Multiple Trauma)  Goal: Optimal Coping with Effects of Injury  Outcome: Ongoing (see  interventions/notes)  Intervention: Support Adjustment to Injury  Recent Flowsheet Documentation  Taken 05/09/2021 1200 by Joana Reamer, [place]: support provided  Supportive Measures: relaxation techniques promoted  Taken 05/09/2021 0800 by Joana Reamer, RN  Family/Support System Care: support provided  Supportive Measures: relaxation techniques promoted     Problem: Bleeding (Multiple Trauma)  Goal: Absence of Bleeding  Outcome: Ongoing (see interventions/notes)     Problem: Cerebral Tissue Perfusion Risk (Multiple Trauma)  Goal: Effective Cerebral Tissue Perfusion  Outcome: Ongoing (see interventions/notes)     Problem: Fluid Imbalance (Multiple Trauma)  Goal: Fluid Balance  Outcome: Ongoing (see interventions/notes)     Problem: Functional Ability Impaired (Multiple Trauma)  Goal: Optimal Functional Ability  Outcome: Ongoing (see interventions/notes)  Intervention: Optimize Functional Ability  Recent Flowsheet Documentation  Taken 05/09/2021 1200 by Joana Reamer, RN  Activity Management: activity adjusted per tolerance  Positioning/Transfer Devices:   in use   pillows  Taken 05/09/2021 0800 by Joana Reamer, RN  Self-Care Promotion: BADL personal routines maintained  Activity Management:   activity adjusted per tolerance   bedrest  Positioning/Transfer Devices:   in use   pillows  Range of Motion: ROM (range of motion) performed     Problem: Infection (Multiple Trauma)  Goal: Absence of Infection Signs and Symptoms  Outcome: Ongoing (see interventions/notes)  Intervention: Prevent or Manage Infection  Recent Flowsheet Documentation  Taken 05/09/2021 1200 by Joana Reamer, RN  Fever Reduction/Comfort Measures:   lightweight clothing   lightweight bedding  Infection Prevention: environmental surveillance performed  Taken 05/09/2021 0800 by Joana Reamer, RN  Fever Reduction/Comfort Measures:   lightweight clothing   lightweight bedding   room temperature adjusted  Infection Prevention: environmental surveillance  performed     Problem: Neurovascular Compromise (Multiple Trauma)  Goal: Effective Peripheral Tissue  Perfusion  Outcome: Ongoing (see interventions/notes)     Problem: Pain (Multiple Trauma)  Goal: Acceptable Pain Control  Outcome: Ongoing (see interventions/notes)     Problem: Respiratory Compromise (Multiple Trauma)  Goal: Effective Oxygenation and Ventilation  Outcome: Ongoing (see interventions/notes)  Intervention: Promote Airway Secretion Clearance  Recent Flowsheet Documentation  Taken 05/09/2021 1200 by Joana Reamer, RN  Cough And Deep Breathing: unable to perform  Activity Management: activity adjusted per tolerance  Taken 05/09/2021 0800 by Joana Reamer, RN  Cough And Deep Breathing: unable to perform  Activity Management:   activity adjusted per tolerance   bedrest  Intervention: Optimize Oxygenation and Ventilation  Recent Flowsheet Documentation  Taken 05/09/2021 1200 by Joana Reamer, RN  Head of Bed [facility]) Positioning: 30 degrees  Taken 05/09/2021 0800 by Joana Reamer, RN  Head of Bed [facility]) Positioning: HOB at 30-45 degrees     Problem: Fluid Imbalance (Pneumonia)  Goal: Fluid Balance  Outcome: Ongoing (see interventions/notes)     Problem: Infection (Pneumonia)  Goal: Resolution of Infection Signs and Symptoms  Outcome: Ongoing (see interventions/notes)  Intervention: Prevent Infection Progression  Recent Flowsheet Documentation  Taken 05/09/2021 1200 by Joana Reamer, RN  Fever Reduction/Comfort Measures:   lightweight clothing   lightweight bedding  Taken 05/09/2021 0800 by Joana Reamer, RN  Fever Reduction/Comfort Measures:   lightweight clothing   lightweight bedding   room temperature adjusted     Problem: Respiratory Compromise (Pneumonia)  Goal: Effective Oxygenation and Ventilation  Outcome: Ongoing (see interventions/notes)  Intervention: Promote Airway Secretion Clearance  Recent Flowsheet Documentation  Taken 05/09/2021 1200 by Joana Reamer, RN  Cough And Deep Breathing: unable to perform  Taken 05/09/2021 0800 by  Joana Reamer, RN  Cough And Deep Breathing: unable to perform  Intervention: Optimize Oxygenation and Ventilation  Recent Flowsheet Documentation  Taken 05/09/2021 1200 by Joana Reamer, RN  Head of Bed [facility]) Positioning: 30 degrees  Taken 05/09/2021 0800 by Joana Reamer, RN  Head of Bed [facility]) Positioning: HOB at 30-45 degrees     Problem: Skin Injury Risk Increased  Goal: Skin Health and Integrity  Outcome: Ongoing (see interventions/notes)  Intervention: Optimize Skin Protection  Recent Flowsheet Documentation  Taken 05/09/2021 1200 by Joana Reamer, RN  Pressure Reduction Techniques: (H,VH) Increase Frequency of Turning  Pressure Reduction Devices: (L.M,H,VH) Specialty Bed utilized  Skin Protection:   tubing/devices free from skin contact   skin-to-device areas padded   incontinence pads utilized   adhesive use limited   skin sealant/moisture barrier applied  Head of Bed [facility]) Positioning: 30 degrees  Taken 05/09/2021 0800 by Joana Reamer, RN  Pressure Reduction Techniques: (H,VH) Increase Frequency of Turning  Pressure Reduction Devices: (L.M,H,VH) Specialty Bed utilized  Skin Protection:   tubing/devices free from skin contact   transparent dressing maintained   skin-to-device areas padded   protective decubiti skin protection foam dressing not able to be applied (comment required)   incontinence pads utilized   adhesive use limited  Head of Bed (HOB) Positioning: HOB at 30-45 degrees     Problem: Alcohol Withdrawal  Goal: Alcohol Withdrawal Symptom Control  Outcome: Ongoing (see interventions/notes)     Problem: Acute Neurologic Deterioration (Alcohol Withdrawal)  Goal: Optimal Neurologic Function  Outcome: Ongoing (see interventions/notes)     Problem: Substance Misuse (Alcohol Withdrawal)  Goal: Readiness for Change Identified  Outcome: Ongoing (see interventions/notes)  Intervention: Promote Psychosocial Wellbeing  Recent Flowsheet Documentation  Taken 05/09/2021 1200 by Joana Reamer, [place]: support  provided  Taken 05/09/2021 0800 by Joana Reamer, RN  Family/Support System Care: support provided  Intervention: Partner to [place] Documentation  Taken 05/09/2021 1200 by Joana Reamer, RN  Family/Support System Care: support provided  Supportive Measures: relaxation techniques promoted  Taken 05/09/2021 0800 by Joana Reamer, RN  Family/Support System Care: support provided  Supportive Measures: relaxation techniques promoted

## 2021-05-10 ENCOUNTER — Inpatient Hospital Stay (HOSPITAL_COMMUNITY): Payer: MEDICAID

## 2021-05-10 DIAGNOSIS — Z043 Encounter for examination and observation following other accident: Secondary | ICD-10-CM

## 2021-05-10 LAB — BASIC METABOLIC PANEL
ANION GAP: 1 mmol/L — ABNORMAL LOW (ref 5–19)
BUN/CREA RATIO: 47 — ABNORMAL HIGH (ref 6–20)
BUN: 25 mg/dL — ABNORMAL HIGH (ref 9–20)
CALCIUM: 8.1 mg/dL — ABNORMAL LOW (ref 8.4–10.2)
CHLORIDE: 107 mmol/L (ref 98–107)
CO2 TOTAL: 33 mmol/L — ABNORMAL HIGH (ref 22–30)
CREATININE: 0.53 mg/dL — ABNORMAL LOW (ref 0.66–1.20)
ESTIMATED GFR: >60 mL/min/{1.73_m2} (ref >60)
GLUCOSE: 112 mg/dL — ABNORMAL HIGH (ref 74–106)
POTASSIUM: 3.5 mmol/L (ref 3.5–5.1)
SODIUM: 141 mmol/L (ref 137–145)

## 2021-05-10 LAB — ARTERIAL BLOOD GAS, CO-OX, LYTES, LACTATE REFLEX
%FIO2 (ARTERIAL): 60 %
(T) PCO2: 47 mm/Hg — ABNORMAL HIGH (ref 35.0–45.0)
(T) PO2: 66 mm/Hg — ABNORMAL LOW (ref 72.0–100.0)
BASE EXCESS (ARTERIAL): 9.3 mmol/L — ABNORMAL HIGH (ref 0.0–1.0)
BICARBONATE (ARTERIAL): 32.1 mmol/L — ABNORMAL HIGH (ref 18.0–26.0)
CARBOXYHEMOGLOBIN: 2.3 % (ref 0.0–2.5)
CHLORIDE: 108 mmol/L (ref 101–111)
GLUCOSE: 110 mg/dL — ABNORMAL HIGH (ref 60–105)
HEMATOCRITRT: 36 %
HEMOGLOBIN: 11.9 g/dL — ABNORMAL LOW (ref 12.0–18.0)
IONIZED CALCIUM: 1.14 mmol/L (ref 1.10–1.35)
LACTATE: 0.6 mmol/L (ref 0.0–1.3)
MET-HEMOGLOBIN: 1.1 % (ref 0.0–2.0)
O2CT: 15.4 % — ABNORMAL LOW (ref 15.7–24.3)
OXYHEMOGLOBIN: 91.8 % (ref 85.0–98.0)
PAO2/FIO2 RATIO: 110 (ref <=200)
PCO2 (ARTERIAL): 47 mm/Hg — ABNORMAL HIGH (ref 35–45)
PH (ARTERIAL): 7.47 — ABNORMAL HIGH (ref 7.35–7.45)
PH (T): 7.47 — ABNORMAL HIGH (ref 7.35–7.45)
PO2 (ARTERIAL): 66 mm/Hg — ABNORMAL LOW (ref 72–100)
SODIUM: 142 mmol/L (ref 137–145)
TEMPERATURE, COMP: 37 C
WHOLE BLOOD POTASSIUM: 3.3 mmol/L — ABNORMAL LOW (ref 3.5–4.6)

## 2021-05-10 LAB — POTASSIUM: POTASSIUM: 3.8 mmol/L (ref 3.5–5.1)

## 2021-05-10 LAB — CBC
HCT: 32.1 % — ABNORMAL LOW (ref 36.0–46.0)
HGB: 10.6 g/dL — ABNORMAL LOW (ref 13.9–16.3)
MCH: 33.7 pg (ref 25.4–34.0)
MCHC: 33 g/dL (ref 30.0–37.0)
MCV: 102.2 fL — ABNORMAL HIGH (ref 80.0–100.0)
MPV: 10 fL (ref 7.5–11.5)
PLATELETS: 85 10*3/uL — ABNORMAL LOW (ref 130–400)
RBC: 3.14 10*6/uL — ABNORMAL LOW (ref 4.30–5.90)
RDW: 15.5 % — ABNORMAL HIGH (ref 11.5–14.0)
WBC: 3.4 10*3/uL — ABNORMAL LOW (ref 4.5–11.5)

## 2021-05-10 LAB — MAGNESIUM: MAGNESIUM: 2.3 mg/dL (ref 1.6–2.3)

## 2021-05-10 LAB — COVID-19 ~~LOC~~ MOLECULAR LAB TESTING: SARS-CoV-2: NOT DETECTED

## 2021-05-10 LAB — PHOSPHORUS: PHOSPHORUS: 4.2 mg/dL (ref 2.5–4.5)

## 2021-05-10 MED ORDER — FUROSEMIDE 10 MG/ML INJECTION SOLUTION
80.0000 mg | Freq: Once | INTRAMUSCULAR | Status: AC
Start: 2021-05-10 — End: 2021-05-10
  Administered 2021-05-10: 80 mg via INTRAVENOUS
  Filled 2021-05-10: qty 8

## 2021-05-10 MED ORDER — POTASSIUM CHLORIDE 20 MEQ/100ML IN STERILE WATER INTRAVENOUS PIGGYBACK
20.0000 meq | INJECTION | INTRAVENOUS | Status: AC
Start: 2021-05-10 — End: 2021-05-10
  Administered 2021-05-10: 0 meq via INTRAVENOUS
  Administered 2021-05-10 (×2): 20 meq via INTRAVENOUS
  Filled 2021-05-10: qty 200

## 2021-05-10 MED ORDER — IOPAMIDOL 370 MG IODINE/ML (76 %) INTRAVENOUS SOLUTION
100.0000 mL | INTRAVENOUS | Status: AC
Start: 2021-05-10 — End: 2021-05-10
  Administered 2021-05-10: 100 mL via INTRAVENOUS

## 2021-05-10 MED ORDER — SODIUM CHLORIDE 0.9 % INTRAVENOUS SOLUTION
0.2000 ug/kg/h | INTRAVENOUS | Status: DC
Start: 2021-05-10 — End: 2021-05-20
  Administered 2021-05-10 (×2): 1 ug/kg/h via INTRAVENOUS
  Administered 2021-05-10: 0.2 ug/kg/h via INTRAVENOUS
  Administered 2021-05-10 (×3): 1 ug/kg/h via INTRAVENOUS
  Administered 2021-05-11 (×2): 1.5 ug/kg/h via INTRAVENOUS
  Administered 2021-05-11: 1 ug/kg/h via INTRAVENOUS
  Administered 2021-05-11: 1.2 ug/kg/h via INTRAVENOUS
  Administered 2021-05-11 (×12): 1.5 ug/kg/h via INTRAVENOUS
  Administered 2021-05-11: 1 ug/kg/h via INTRAVENOUS
  Administered 2021-05-11: 1.5 ug/kg/h via INTRAVENOUS
  Administered 2021-05-11 (×2): 1 ug/kg/h via INTRAVENOUS
  Administered 2021-05-11 – 2021-05-12 (×7): 1.5 ug/kg/h via INTRAVENOUS
  Administered 2021-05-12: 0.2 ug/kg/h via INTRAVENOUS
  Administered 2021-05-12: 1.5 ug/kg/h via INTRAVENOUS
  Administered 2021-05-12: 0 ug/kg/h via INTRAVENOUS
  Administered 2021-05-12: 0.2 ug/kg/h via INTRAVENOUS
  Administered 2021-05-13: 0.8 ug/kg/h via INTRAVENOUS
  Administered 2021-05-13 (×3): 1 ug/kg/h via INTRAVENOUS
  Administered 2021-05-13: 1.5 ug/kg/h via INTRAVENOUS
  Administered 2021-05-13 (×5): 1 ug/kg/h via INTRAVENOUS
  Administered 2021-05-14: 1.2 ug/kg/h via INTRAVENOUS
  Administered 2021-05-14 (×2): 1.5 ug/kg/h via INTRAVENOUS
  Administered 2021-05-14: 1.1 ug/kg/h via INTRAVENOUS
  Administered 2021-05-14: 1.4 ug/kg/h via INTRAVENOUS
  Administered 2021-05-14: 1 ug/kg/h via INTRAVENOUS
  Administered 2021-05-14: 0.5 ug/kg/h via INTRAVENOUS
  Administered 2021-05-14: 1.5 ug/kg/h via INTRAVENOUS
  Administered 2021-05-14: 0.4 ug/kg/h via INTRAVENOUS
  Administered 2021-05-14: 0.8 ug/kg/h via INTRAVENOUS
  Administered 2021-05-14: 1.2 ug/kg/h via INTRAVENOUS
  Administered 2021-05-14: 1.3 ug/kg/h via INTRAVENOUS
  Administered 2021-05-14: 0.6 ug/kg/h via INTRAVENOUS
  Administered 2021-05-14: 1 ug/kg/h via INTRAVENOUS
  Administered 2021-05-15: 0.5 ug/kg/h via INTRAVENOUS
  Administered 2021-05-15 (×3): 1 ug/kg/h via INTRAVENOUS
  Administered 2021-05-15: 1.5 ug/kg/h via INTRAVENOUS
  Administered 2021-05-15: 1 ug/kg/h via INTRAVENOUS
  Administered 2021-05-15: 1.5 ug/kg/h via INTRAVENOUS
  Administered 2021-05-15: 1 ug/kg/h via INTRAVENOUS
  Administered 2021-05-15: 0.8 ug/kg/h via INTRAVENOUS
  Administered 2021-05-15: 1.5 ug/kg/h via INTRAVENOUS
  Administered 2021-05-15 (×3): 1 ug/kg/h via INTRAVENOUS
  Administered 2021-05-15 (×3): 1.5 ug/kg/h via INTRAVENOUS
  Administered 2021-05-15: 1 ug/kg/h via INTRAVENOUS
  Administered 2021-05-15: 1.5 ug/kg/h via INTRAVENOUS
  Administered 2021-05-16: 0.8 ug/kg/h via INTRAVENOUS
  Administered 2021-05-16: 1 ug/kg/h via INTRAVENOUS
  Administered 2021-05-16: 1.5 ug/kg/h via INTRAVENOUS
  Administered 2021-05-16: 1 ug/kg/h via INTRAVENOUS
  Administered 2021-05-16 (×2): 1.5 ug/kg/h via INTRAVENOUS
  Administered 2021-05-16 (×3): 1 ug/kg/h via INTRAVENOUS
  Administered 2021-05-16 (×2): 1.5 ug/kg/h via INTRAVENOUS
  Administered 2021-05-16 (×3): 0.6 ug/kg/h via INTRAVENOUS
  Administered 2021-05-16: 1.5 ug/kg/h via INTRAVENOUS
  Administered 2021-05-16: 0.6 ug/kg/h via INTRAVENOUS
  Administered 2021-05-16: 1.5 ug/kg/h via INTRAVENOUS
  Administered 2021-05-16 (×2): 1 ug/kg/h via INTRAVENOUS
  Administered 2021-05-16: 1.2 ug/kg/h via INTRAVENOUS
  Administered 2021-05-16: 1.5 ug/kg/h via INTRAVENOUS
  Administered 2021-05-17 (×5): 0.6 ug/kg/h via INTRAVENOUS
  Administered 2021-05-18: 0.2 ug/kg/h via INTRAVENOUS
  Administered 2021-05-18: 0.5 ug/kg/h via INTRAVENOUS
  Administered 2021-05-18: 0.499 ug/kg/h via INTRAVENOUS
  Administered 2021-05-19: 0 ug/kg/h via INTRAVENOUS
  Administered 2021-05-19: 0.499 ug/kg/h via INTRAVENOUS
  Administered 2021-05-19: 0.5 ug/kg/h via INTRAVENOUS
  Administered 2021-05-19: 0.499 ug/kg/h via INTRAVENOUS
  Filled 2021-05-10 (×31): qty 10

## 2021-05-10 NOTE — Care Plan (Signed)
Patient remains in ICU intubated.  Receiving Precedex and fentanyl gtt. CTA was negative today. VSS. 80 of lasix given. Covid test result was negative.  Problem: Alcohol Withdrawal  Goal: Alcohol Withdrawal Symptom Control  Outcome: Ongoing (see interventions/notes)  Intervention: Minimize or Manage Alcohol Withdrawal Symptoms  Flowsheets (Taken 05/10/2021 1532)  Sensory Stimulation Regulation:   care clustered   lighting decreased   quiet environment promoted  Aspiration Precautions: tube feeding placement verified  Seizure Precautions: emergency equipment at bedside     Problem: Acute Neurologic Deterioration (Alcohol Withdrawal)  Goal: Optimal Neurologic Function  Outcome: Ongoing (see interventions/notes)  Intervention: Minimize or Manage Acute Neurologic Symptoms  Flowsheets (Taken 05/10/2021 1532)  Sensory Stimulation Regulation:   care clustered   lighting decreased   quiet environment promoted  Airway/Ventilation Management: airway patency maintained  Cerebral Perfusion Promotion: sedation level decreased  Intervention: Prevent Seizure-Related Injury  Flowsheets (Taken 05/10/2021 1532)  Aspiration Prevention During Seizure: suctioned secretions/vomitus  Seizure Precautions: emergency equipment at bedside     Problem: Substance Misuse (Alcohol Withdrawal)  Goal: Readiness for Change Identified  Outcome: Ongoing (see interventions/notes)  Intervention: Promote Psychosocial Wellbeing  Flowsheets  Taken 05/10/2021 [place]: caregiver stress acknowledged  Taken 05/10/2021 0900  Family/Support System Care: support provided  Intervention: Partner to [place]  Taken 05/10/2021 [place]: caregiver stress acknowledged  Supportive Measures:   active listening utilized   relaxation techniques promoted  Taken 05/10/2021 0900  Family/Support System Care: support provided  Supportive Measures: relaxation techniques promoted

## 2021-05-10 NOTE — Care Management Notes (Signed)
527  Remains in ICU on the Vent   CXR showed increased opacities  Increase in CHF  Lung sounds coarse   D/C needs uncertain at this rime

## 2021-05-10 NOTE — Progress Notes (Signed)
Name: Hector [name] MRN:  [id number]   Date of admission: 05/01/2021 Age: 60 y.o.       Name - Hector [name]    Date of service - 05/10/2021      Interval history  The patient remains intubated in the ICU.  This morning however he is increasing FiO2 and PEEP requirements.  His PEEP is now 10.  He has been tolerating tube feeds at goal.  Any time nursing staff attempts to wean sedation patient has issues breathing and hold his breath.  Afebrile.  Patient is being tested for COVID.  Patient did undergo an echocardiogram yesterday that showed no major abnormalities and a normal EF of 56%.    Physical exam  Filed Vitals:    05/10/21 0700 05/10/21 0724 05/10/21 0800 05/10/21 0900   BP: (!) 101/57   (!) 94/52   Pulse: 71   72   Resp: (!) 23   (!) 22   Temp:   37.1 C (98.8 F)    SpO2:  95%  94%          Intake/Output Summary (Last 24 hours) at 05/10/2021 0908  Last data filed at 05/10/2021 0600  Gross per 24 hour   Intake 2274.73 ml   Output 1225 ml   Net 1049.73 ml        General:Intubated in the ICU with sedation  Abdomen: soft, nontender, nondistended,small umbilical hernia  Skin: No jaundice, lesions, or rashes.  Lungs:On the ventilator   CV: Hemodynamically stable  Extremities:Patient appears to have fluid overload in all extremities      Labs/Imaging -     Procedure:  Transthoracic complete echo, 2D, spectral and tissue Doppler, color flow Doppler, M-mode. 05/10/21    Quality:  The study images were of technically adequate quality.    Indications: Respiratory failure (CMS HCC)    History: HTN, Alcohol Abuse, Smoker, Resp Failure    Conclusions:  Mild left ventricular hypertrophy.  Left ventricular systolic function is normal.  RV systolic pressure is consistent with mild pulmonary hypertension.  Mildly dilated left atrium.  The right atrium is mildly dilated.  There is mild aortic stenosis.    Findings  Left Ventricle:   Normal left ventricular size. Concentric remodeling. Mild left ventricular  hypertrophy. Left ventricular systolic function is normal. Ejection Fraction is 56.9 %. No  segmental/regional wall motion abnormalities identified. Left ventricular diastolic parameters are normal.  Right Ventricle:   Normal right ventricular size. Normal right ventricular systolic function. RV systolic pressure is consistent with mild pulmonary hypertension.  Left Atrium:   Mildly dilated left atrium.  Right Atrium:   The right atrium is mildly dilated.  Mitral Valve:   The mitral valve is normal. No evidence of mitral stenosis. No significant mitral regurgitation present.  Tricuspid Valve:   The tricuspid valve is normal. Trace tricuspid regurgitation present.  Aortic Valve:   Trileaflet aortic valve. AVA VTI 1.89cm2. AV Mean PG 14 mmHg. Vmax 268cm/s. There is mild aortic stenosis. There is no evidence of aortic regurgitation.  Pulmonic Valve:   The pulmonic valve is not well visualized. No significant pulmonic valve regurgitation present.  Pulmonary Artery:   The pulmonary artery is not well visualized.  Atrial Septum:   The interatrial septum is normal in appearance.  IVC/Hepatic Veins:   Normal IVC size with <50% inspiratory collapse (estimated RA pressure: 8 mmHg).  Aorta:   The aortic root is of normal size. The ascending aorta is normal in size.  Pericardium/Pleural space:   Normal pericardium with no pericardial effusion.        XR AP MOBILE CHEST performed on 05/10/2021 6:19 AM    CLINICAL HISTORY: respiratory failure.  resp fail-vent    TECHNIQUE: Frontal view of the chest.    COMPARISON:  05/09/2021    FINDINGS:  The support lines and tubes are in stable and satisfactory position.   Cardiac and mediastinal contours are stable.   Airspace disease is present in the mid to lower lungs with probable bilateral pleural effusions. Lung volumes are slightly improved since yesterday         IMPRESSION:  BILATERAL AIRSPACE DISEASE AND PLEURAL EFFUSIONS WITH SOME IMPROVEMENT SINCE  YESTERDAY        ASSESSMENT & RECOMMENDATIONS:   60 y.o.malestatus post fall downstairs sustaining multiple left-sided rib fractures with subcutaneous emphysema,transverse process fracture of thoracic spine,and grade 3 splenic laceration  - patient to remain intubated in the ICUfor today.Marland KitchenMarland KitchenThe patient is having increasing FiO2 requirements as well as peep.Marland KitchenMarland Kitchen  I would recommend a CTA to rule out pulmonary embolus  -antibiotics (Unasyn) for the developing strep pneumo pneumonia  -continue with Lasix diuresis  -Lovenox for DVT prophylaxis  -continue tube feeding at goal      Clinton Quant, MD      Note: A portion of this documentation was generated using MMODAL (voice recognition software) and may contain syntax/voice recognition errors.

## 2021-05-10 NOTE — Progress Notes (Signed)
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     TECHNIQUE: Frontal view of the chest.    COMPARISON:  Yesterday    FINDINGS:  The support lines and tubes are in stable and satisfactory position.   Cardiac and mediastinal contours are stable.   No significant change in the appearance of the lungs.   Extensive bilateral pulmonary infiltrates persist with a moderate size left pleural effusion.        Impression    NO SIGNIFICANT CHANGE SINCE THE PRIOR EXAM.      Radiologist location ID: [id number]     [place] CHEST (EFFUSION)     Status: None    Narrative    Garnell LEE Goncalves    RADIOLOGIST: Colletta Maryland, MD    [place] CHEST (EFFUSION) performed on 05/07/2021 11:17 AM    CLINICAL HISTORY: pleural effusions..  check left pleural effusion    TECHNIQUE:  Ultrasound imaging of left chest.    COMPARISON:  None.    FINDINGS:  There is a moderate-sized left pleural effusion.        Impression    LEFT PLEURAL EFFUSION        Radiologist location ID: [id number]     US THORACENTESIS     Status: None    Narrative    *Procedure not read by radiology.    *Please Refer to Procedure Note for result.   XR AP MOBILE CHEST     Status: None    Narrative    Haydyn LEE Segler    RADIOLOGIST: Berton Bon, MD    XR AP MOBILE CHEST performed on 05/07/2021 4:40 PM    CLINICAL HISTORY: POST THORACENTESIS.  s/p left thoracentisis.     TECHNIQUE: Frontal view of the chest.    COMPARISON:  Previous exam  from today    FINDINGS:  ET tube 5 cm above the carina. There is an NG tube in the stomach. There is a left PICC catheter in the SVC   Heart size is mildly enlarged.     There are perihilar and basilar infiltrates. Possible small effusions obscuring the diaphragms   There are multiple left lateral rib fracture deformities        Impression    Negative for pneumothorax after thoracentesis      Radiologist location ID: [id number]     XR AP MOBILE CHEST     Status: None    Narrative    Willy LEE Hanna    RADIOLOGIST: Otho Najjar, MD    XR AP MOBILE CHEST performed on 05/09/2021 6:15 AM    CLINICAL HISTORY: Mult. L rib fx.s, effusion, intubated.  resp fail-vent, effusion, multiple rib fx's    TECHNIQUE: Frontal view of the chest.    COMPARISON:  05/07/2021    FINDINGS:  The support lines and tubes are in stable and satisfactory position. No pneumothorax.     There is increasing opacity bilaterally in the mid lung fields with infiltrate, effusion and vascular congestion. This could be inflammatory but the symmetry suggests development of pulmonary edema. Clinical correlation recommended.      Impression    Increasing opacity bilaterally suggesting increasing CHF. Clinical correlation recommended.      Radiologist location ID: [id number]     XR AP MOBILE CHEST     Status: None    Narrative    Faizaan LEE Ennis    RADIOLOGIST: Colletta Maryland, MD    XR AP MOBILE CHEST performed on 05/10/2021 6:19 AM    CLINICAL HISTORY: respiratory failure.  resp fail-vent    TECHNIQUE: Frontal view of the chest.    COMPARISON:  05/09/2021    FINDINGS:  The support lines and tubes are in stable and satisfactory position.   Cardiac and mediastinal contours are stable.   Airspace disease is present in the mid to lower lungs with probable bilateral pleural effusions. Lung volumes are slightly improved since yesterday           Impression    BILATERAL AIRSPACE DISEASE AND PLEURAL EFFUSIONS WITH SOME IMPROVEMENT SINCE YESTERDAY      Radiologist  location ID: [id number]        All current radiology studies were reviewed.    PHYSICAL EXAMINATION  Temperature: 37.1 C (98.8 F)  Heart Rate: 72  BP (Non-Invasive): (!) 94/52  Respiratory Rate: (!) 22  SpO2: 94 %    General: 60 yo male sedated and maintained on MV   HEENT: Normocephalic.  Oral  endotracheal tube and orogastric tube in place  Neck: Trach midline, supple   Heart: Sinus rhythm, HR 70's. 1+ edema. Pulses 2+. Brisk capillary refill   Lungs: Maintained on mechanical ventilation. Breath sounds with rhonchi throughout. Small thick, white secretions from ETT currently  Abdomen: Soft, round, non-tender on palpation, BS normoactive, OGT with tube feedings infusing/ tolerating. BM 5/27, soft/ brown  Renal: Foley catheter draining amber urine to gravity   Extremities: 1+ edema noted to BLE and hands  Skin: Warm and dry  Neuro: Sedated with current RASS -4. When decreased sedation for goal RASS 0 to -1, becomes agitated. PERRL  Psych: Sedated     VENTILATOR/SETTINGS:       OET= Size 7.5 mm; taped at 26 cm       Mode:  Assist control       Tidal Volume:  500       Ventilator Rate:  22       FI02:  60%       PEEP:  5 cm       Peak air pressure is 26 cm and mean airway pressure 12 sent      PROBLEM LIST:   Active Hospital Problems   (*Primary Problem)    Diagnosis   . *Spleen injury   . On mechanically assisted ventilation (CMS HCC)   . Multiple rib fractures   . Left pulmonary contusion   . Closed fracture of transverse process of thoracic vertebra (CMS HCC)   . 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 Thrombocytopenia (CMS HCC)   . Alcohol abuse      NEURO:  - Hx alcoholism  - S/p trauma with fall down 11 stairs while intoxicated   - Sedated with propofol and no s/s withdrawal. On multivitamin and thiamine.   - CTH: No acute intracranial findings. Left posterior occipital scalp hematoma  - CT C-spine and lumbar spine negative   - CT thoracic spine: Multiple rib fractures (medial left 4th- 6th and sixth  ribs and posterior lateral left 4th-7th . Possible non-displaced left transverse process fractures of T5 and T6   - Analgesic: Fentanyl infusion, Lidoderm patch   - Continue Seroquel and Serax   - Daily sedation vacation for neuro assessments; currently not tolerating    RESP:  - Acute respiratory failure with hypoxia ->transferred to ICU on 5/19 for intubation   - CTA chest: small left pneumothorax <5% resolved on follow up the following day. Rt>Lt. Trace left pleural effusion. Multiple displaced and comminuted left rib fractures. SubQ air left chest wall extending into the left flank  - CT with possible small bilat LL collapse/plugging - 05/05/21 bronch with multiple small casts from BLL - repeat CXR with no change. No cultures obtained at that time.  - Worsening hypoxia with pO2 60's, peep increased to 10 on 5/27 -> Covid swab and CTA r/o PE pending  - DuoNebs every 4 hours as needed  - Strep pneumo PNA  -> Unasyn continued and D#7  - Serial CXRs and ABGs    CVS:  - Hx HTN  - Hypertensive ->resolved while sedated  - Routine hemodynamic monitoring and continuous telemetry    GI:  - CTA abdomen/ pelvis: Grade 3 splenic injury. Cirrhosis with evidence of portal hypertension  - Repeat CT A/Pon5/20: Stable splenic laceration  - Continue daily H/H's if he bleeds he will require embolization  - NPO  -Tube feeding, tolerating at goal   -Prophylaxis: IV Pepcid  -  Bowel regimen: Colace and Senna    Renal:  - Lasix 20 mg TID with good diuresis. Remains positive LOS   - Foley catheter for strict I/O's  - Monitor and correct electrolyte imbalance as needed  - Serial BMPs    HEME:  - No active bleeding noted; H/H stable  - Thrombocytopeniacontinue to trend  - SQ Lovenox andSCD's for prophylaxis  - Serial CBC differential    ID:  - WBC3.4  - Pneumonia -> Unasyn D#7 (Unasyn dose increased 5/21)  - Sputum culture growing Haemophilis and Kelbsiella as of 5/23 and and now with strep pneumoniae  - Urine strep  pneumo and legionellanegative  - Procalcitonin0.10  -Blood cultures negative  - UAwith no signs of infective source  - Vaccinations ordered, not given yet    ENDO:  - TSH4.13and HA1C5.3  - POCT glucometer Q 6 hours, add SSI as needed for optimal glycemic controland will continue adjust as necessary to achieve optimal BS levels    Prophylaxis:  -Lovenox, SCDs, Pepcid, Peridex,HOB > 30, pulmonary hygiene    Code Status:  Full Code     Critical Care Time: 42 minutes    I have personally examined the patient at the bedside; reviewed the medical chart, laboratory data, radiologic imaging and discussed patient's case at bedside with multidisciplinary critical care team. Critical Care excludes any bedside procedures or teaching. Dr. Einar Gip available for collaboration at all times.    Letta Median, APRN

## 2021-05-10 NOTE — Result Encounter Note (Signed)
Nursing recommends no PT due to status - being tested for COVID     Jackie Plum, PT, DPT

## 2021-05-10 NOTE — Care Plan (Signed)
Problem: Adult Inpatient Plan of Care  Goal: Plan of Care Review  Outcome: Ongoing (see interventions/notes)  Goal: Patient-Specific Goal (Individualized)  Outcome: Ongoing (see interventions/notes)  Goal: Absence of Hospital-Acquired Illness or Injury  Outcome: Ongoing (see interventions/notes)  Intervention: Identify and Manage Fall Risk  Recent Flowsheet Documentation  Taken 05/09/2021 2000 by Evie Lacks, RN  Safety Promotion/Fall Prevention: safety round/check completed  Intervention: Prevent Skin Injury  Recent Flowsheet Documentation  Taken 05/09/2021 2100 by Evie Lacks, RN  Body Position: side lying, right  Intervention: Prevent and Manage VTE (Venous Thromboembolism) Risk  Recent Flowsheet Documentation  Taken 05/09/2021 2000 by Evie Lacks, RN  VTE Prevention/Management: sequential compression devices on  Goal: Optimal Comfort and Wellbeing  Outcome: Ongoing (see interventions/notes)  Goal: Rounds/Family Conference  Outcome: Ongoing (see interventions/notes)     Problem: Fall Injury Risk  Goal: Absence of Fall and Fall-Related Injury  Outcome: Ongoing (see interventions/notes)  Intervention: Promote Injury-Free Environment  Recent Flowsheet Documentation  Taken 05/09/2021 2000 by Evie Lacks, RN  Safety Promotion/Fall Prevention: safety round/check completed     Problem: Pain Acute  Goal: Acceptable Pain Control and Functional Ability  Outcome: Ongoing (see interventions/notes)     Problem: Communication Impairment (Mechanical Ventilation, Invasive)  Goal: Effective Communication  Outcome: Ongoing (see interventions/notes)     Problem: Device-Related Complication Risk (Mechanical Ventilation, Invasive)  Goal: Optimal Device Function  Outcome: Ongoing (see interventions/notes)  Intervention: Optimize Device Care and Function  Recent Flowsheet Documentation  Taken 05/10/2021 0000 by Evie Lacks, RN  Airway Safety Measures: manual resuscitator at bedside  Taken 05/09/2021  2000 by Evie Lacks, RN  Airway Safety Measures: manual resuscitator at bedside     Problem: Inability to Wean (Mechanical Ventilation, Invasive)  Goal: Mechanical Ventilation Liberation  Outcome: Ongoing (see interventions/notes)     Problem: Nutrition Impairment (Mechanical Ventilation, Invasive)  Goal: Optimal Nutrition Delivery  Outcome: Ongoing (see interventions/notes)     Problem: Skin and Tissue Injury (Mechanical Ventilation, Invasive)  Goal: Absence of Device-Related Skin and Tissue Injury  Outcome: Ongoing (see interventions/notes)     Problem: Ventilator-Induced Lung Injury (Mechanical Ventilation, Invasive)  Goal: Absence of Ventilator-Induced Lung Injury  Outcome: Ongoing (see interventions/notes)  Intervention: Prevent Ventilator-Associated Pneumonia  Recent Flowsheet Documentation  Taken 05/09/2021 2100 by Evie Lacks, RN  Oral Care: oral care with Chlorhexidine provided  Head of Bed (HOB) Positioning: 30 degrees     Problem: Non-violent/Non-Self Destructive Restraints  Goal: Alternative methods tried prior to restraints  Outcome: Ongoing (see interventions/notes)  Goal: Patient free from injury and discomfort  Outcome: Ongoing (see interventions/notes)  Goal: Autonomy maintained at the highest possible level  Outcome: Ongoing (see interventions/notes)  Goal: Need for restraints reassessed per policy  Outcome: Ongoing (see interventions/notes)  Goal: Patient education provided  Outcome: Ongoing (see interventions/notes)  Goal: Problem Interventions  Outcome: Ongoing (see interventions/notes)     Problem: Bleeding (Orthopaedic Fracture)  Goal: Absence of Bleeding  Outcome: Ongoing (see interventions/notes)     Problem: Embolism (Orthopaedic Fracture)  Goal: Absence of Embolism Signs and Symptoms  Outcome: Ongoing (see interventions/notes)  Intervention: Prevent or Manage Embolism Risk  Recent Flowsheet Documentation  Taken 05/09/2021 2000 by Evie Lacks, RN  VTE  Prevention/Management: sequential compression devices on     Problem: Fracture Stabilization and Management (Orthopaedic Fracture)  Goal: Fracture Stability  Outcome: Ongoing (see interventions/notes)     Problem: Functional Ability Impaired (Orthopaedic Fracture)  Goal: Optimal Functional Ability  Outcome: Ongoing (see interventions/notes)     Problem:  Infection (Orthopaedic Fracture)  Goal: Absence of Infection Signs and Symptoms  Outcome: Ongoing (see interventions/notes)  Intervention: Prevent or Manage Infection  Recent Flowsheet Documentation  Taken 05/09/2021 2000 by Evie Lacks, RN  Fever Reduction/Comfort Measures:   lightweight bedding   lightweight clothing     Problem: Neurovascular Compromise (Orthopaedic Fracture)  Goal: Effective Tissue Perfusion  Outcome: Ongoing (see interventions/notes)     Problem: Pain (Orthopaedic Fracture)  Goal: Acceptable Pain Control  Outcome: Ongoing (see interventions/notes)     Problem: Respiratory Compromise (Orthopaedic Fracture)  Goal: Effective Oxygenation and Ventilation  Outcome: Ongoing (see interventions/notes)  Intervention: Promote Airway Secretion Clearance  Recent Flowsheet Documentation  Taken 05/10/2021 0000 by Evie Lacks, RN  Cough And Deep Breathing: unable to perform  Taken 05/09/2021 2000 by Evie Lacks, RN  Cough And Deep Breathing: unable to perform     Problem: ARDS (Acute Respiratory Distress Syndrome)  Goal: Effective Oxygenation  Outcome: Ongoing (see interventions/notes)     Problem: Adjustment to Injury (Multiple Trauma)  Goal: Optimal Coping with Effects of Injury  Outcome: Ongoing (see interventions/notes)     Problem: Bleeding (Multiple Trauma)  Goal: Absence of Bleeding  Outcome: Ongoing (see interventions/notes)     Problem: Cerebral Tissue Perfusion Risk (Multiple Trauma)  Goal: Effective Cerebral Tissue Perfusion  Outcome: Ongoing (see interventions/notes)     Problem: Fluid Imbalance (Multiple Trauma)  Goal: Fluid  Balance  Outcome: Ongoing (see interventions/notes)     Problem: Functional Ability Impaired (Multiple Trauma)  Goal: Optimal Functional Ability  Outcome: Ongoing (see interventions/notes)     Problem: Infection (Multiple Trauma)  Goal: Absence of Infection Signs and Symptoms  Outcome: Ongoing (see interventions/notes)  Intervention: Prevent or Manage Infection  Recent Flowsheet Documentation  Taken 05/09/2021 2000 by Evie Lacks, RN  Fever Reduction/Comfort Measures:   lightweight bedding   lightweight clothing     Problem: Neurovascular Compromise (Multiple Trauma)  Goal: Effective Peripheral Tissue Perfusion  Outcome: Ongoing (see interventions/notes)     Problem: Pain (Multiple Trauma)  Goal: Acceptable Pain Control  Outcome: Ongoing (see interventions/notes)     Problem: Respiratory Compromise (Multiple Trauma)  Goal: Effective Oxygenation and Ventilation  Outcome: Ongoing (see interventions/notes)  Intervention: Promote Airway Secretion Clearance  Recent Flowsheet Documentation  Taken 05/10/2021 0000 by Evie Lacks, RN  Cough And Deep Breathing: unable to perform  Taken 05/09/2021 2000 by Evie Lacks, RN  Cough And Deep Breathing: unable to perform  Intervention: Optimize Oxygenation and Ventilation  Recent Flowsheet Documentation  Taken 05/09/2021 2100 by Evie Lacks, RN  Head of Bed [facility]) Positioning: 30 degrees     Problem: Fluid Imbalance (Pneumonia)  Goal: Fluid Balance  Outcome: Ongoing (see interventions/notes)     Problem: Infection (Pneumonia)  Goal: Resolution of Infection Signs and Symptoms  Outcome: Ongoing (see interventions/notes)  Intervention: Prevent Infection Progression  Recent Flowsheet Documentation  Taken 05/09/2021 2000 by Evie Lacks, RN  Fever Reduction/Comfort Measures:   lightweight bedding   lightweight clothing     Problem: Respiratory Compromise (Pneumonia)  Goal: Effective Oxygenation and Ventilation  Outcome: Ongoing (see  interventions/notes)  Intervention: Promote Airway Secretion Clearance  Recent Flowsheet Documentation  Taken 05/10/2021 0000 by Evie Lacks, RN  Cough And Deep Breathing: unable to perform  Taken 05/09/2021 2000 by Evie Lacks, RN  Cough And Deep Breathing: unable to perform  Intervention: Optimize Oxygenation and Ventilation  Recent Flowsheet Documentation  Taken 05/09/2021 2100 by Evie Lacks, RN  Head of Bed [facility]) Positioning: 30 degrees  Problem: Skin Injury Risk Increased  Goal: Skin Health and Integrity  Outcome: Ongoing (see interventions/notes)  Intervention: Optimize Skin Protection  Recent Flowsheet Documentation  Taken 05/09/2021 2100 by Evie Lacks, RN  Head of Bed [facility]) Positioning: 30 degrees  Taken 05/09/2021 2000 by Evie Lacks, RN  Skin Protection:   drying agents applied   incontinence pads utilized     Problem: Alcohol Withdrawal  Goal: Alcohol Withdrawal Symptom Control  Outcome: Ongoing (see interventions/notes)     Problem: Acute Neurologic Deterioration (Alcohol Withdrawal)  Goal: Optimal Neurologic Function  Outcome: Ongoing (see interventions/notes)     Problem: Substance Misuse (Alcohol Withdrawal)  Goal: Readiness for Change Identified  Outcome: Ongoing (see interventions/notes)

## 2021-05-10 NOTE — Care Plan (Signed)
Attempted to see patient this morning.  OT hold due to testing for COVID.  He remains sedated and on the vent.  Plan:  will follow and progress as indicated and tolerated.  Landry Corporal, OT

## 2021-05-11 ENCOUNTER — Other Ambulatory Visit: Payer: Self-pay

## 2021-05-11 ENCOUNTER — Ambulatory Visit (HOSPITAL_COMMUNITY): Payer: MEDICAID

## 2021-05-11 DIAGNOSIS — S22059A Unspecified fracture of T5-T6 vertebra, initial encounter for closed fracture: Secondary | ICD-10-CM

## 2021-05-11 DIAGNOSIS — J90 Pleural effusion, not elsewhere classified: Secondary | ICD-10-CM

## 2021-05-11 HISTORY — DX: Pleural effusion, not elsewhere classified: J90

## 2021-05-11 LAB — BASIC METABOLIC PANEL
ANION GAP: 5 mmol/L (ref 5–19)
BUN/CREA RATIO: 54 — ABNORMAL HIGH (ref 6–20)
BUN: 28 mg/dL — ABNORMAL HIGH (ref 9–20)
CALCIUM: 8.4 mg/dL (ref 8.4–10.2)
CHLORIDE: 108 mmol/L — ABNORMAL HIGH (ref 98–107)
CO2 TOTAL: 32 mmol/L — ABNORMAL HIGH (ref 22–30)
CREATININE: 0.52 mg/dL — ABNORMAL LOW (ref 0.66–1.20)
ESTIMATED GFR: >60 mL/min/{1.73_m2} (ref >60)
GLUCOSE: 142 mg/dL — ABNORMAL HIGH (ref 74–106)
POTASSIUM: 3.6 mmol/L (ref 3.5–5.1)
SODIUM: 145 mmol/L (ref 137–145)

## 2021-05-11 LAB — ARTERIAL BLOOD GAS, CO-OX, LYTES, LACTATE REFLEX
%FIO2 (ARTERIAL): 60 %
(T) PCO2: 41 mm/Hg (ref 35.0–45.0)
(T) PO2: 83 mm/Hg (ref 72.0–100.0)
BASE EXCESS (ARTERIAL): 9.7 mmol/L — ABNORMAL HIGH (ref 0.0–1.0)
BICARBONATE (ARTERIAL): 32.5 mmol/L — ABNORMAL HIGH (ref 18.0–26.0)
CARBOXYHEMOGLOBIN: 2 % (ref 0.0–2.5)
CHLORIDE: 110 mmol/L (ref 101–111)
GLUCOSE: 138 mg/dL — ABNORMAL HIGH (ref 60–105)
HEMATOCRITRT: 33 %
HEMOGLOBIN: 11.1 g/dL — ABNORMAL LOW (ref 12.0–18.0)
IONIZED CALCIUM: 1.2 mmol/L (ref 1.10–1.35)
LACTATE: 0.8 mmol/L (ref 0.0–1.3)
MET-HEMOGLOBIN: 0.7 % (ref 0.0–2.0)
O2CT: 15.1 % — ABNORMAL LOW (ref 15.7–24.3)
OXYHEMOGLOBIN: 95.9 % (ref 85.0–98.0)
PAO2/FIO2 RATIO: 138 (ref <=200)
PCO2 (ARTERIAL): 41 mm/Hg (ref 35–45)
PH (ARTERIAL): 7.52 — ABNORMAL HIGH (ref 7.35–7.45)
PH (T): 7.52 — ABNORMAL HIGH (ref 7.35–7.45)
PO2 (ARTERIAL): 83 mm/Hg (ref 72–100)
SODIUM: 144 mmol/L (ref 137–145)
TEMPERATURE, COMP: 37 C
WHOLE BLOOD POTASSIUM: 3.9 mmol/L (ref 3.5–4.6)

## 2021-05-11 LAB — ANAEROBIC CULTURE: ANAEROBIC CULTURE: NO GROWTH

## 2021-05-11 LAB — FLUID CULTURE & GRAM STAIN: FLC: NO GROWTH

## 2021-05-11 LAB — CBC
HCT: 34.5 % — ABNORMAL LOW (ref 36.0–46.0)
HGB: 11.3 g/dL — ABNORMAL LOW (ref 13.9–16.3)
MCH: 33.4 pg (ref 25.4–34.0)
MCHC: 32.9 g/dL (ref 30.0–37.0)
MCV: 101.8 fL — ABNORMAL HIGH (ref 80.0–100.0)
MPV: 10.2 fL (ref 7.5–11.5)
PLATELETS: 90 10*3/uL — ABNORMAL LOW (ref 130–400)
RBC: 3.39 10*6/uL — ABNORMAL LOW (ref 4.30–5.90)
RDW: 15.5 % — ABNORMAL HIGH (ref 11.5–14.0)
WBC: 3.8 10*3/uL — ABNORMAL LOW (ref 4.5–11.5)

## 2021-05-11 LAB — POTASSIUM
POTASSIUM: 3.2 mmol/L — ABNORMAL LOW (ref 3.5–5.1)
POTASSIUM: 3.1 mmol/L — ABNORMAL LOW (ref 3.5–5.1)
POTASSIUM: 3.3 mmol/L — ABNORMAL LOW (ref 3.5–5.1)

## 2021-05-11 LAB — MAGNESIUM: MAGNESIUM: 2.4 mg/dL — ABNORMAL HIGH (ref 1.6–2.3)

## 2021-05-11 LAB — PHOSPHORUS: PHOSPHORUS: 3.5 mg/dL (ref 2.5–4.5)

## 2021-05-11 MED ORDER — LACTULOSE 20 GRAM/30 ML ORAL SOLUTION
30.0000 mL | ORAL | Status: AC
Start: 2021-05-11 — End: 2021-05-11
  Administered 2021-05-11: 30 mL via ORAL
  Filled 2021-05-11: qty 30

## 2021-05-11 MED ORDER — POTASSIUM CHLORIDE 20 MEQ/100ML IN STERILE WATER INTRAVENOUS PIGGYBACK
20.0000 meq | INJECTION | INTRAVENOUS | Status: AC | PRN
Start: 2021-05-11 — End: 2021-05-11
  Administered 2021-05-11: 20 meq via INTRAVENOUS
  Administered 2021-05-11: 0 meq via INTRAVENOUS
  Filled 2021-05-11 (×2): qty 100

## 2021-05-11 MED ORDER — POTASSIUM CHLORIDE 20 MEQ/100ML IN STERILE WATER INTRAVENOUS PIGGYBACK
20.0000 meq | INJECTION | INTRAVENOUS | Status: DC | PRN
Start: 2021-05-11 — End: 2021-05-30
  Administered 2021-05-14: 20 meq via INTRAVENOUS
  Administered 2021-05-14: 0 meq via INTRAVENOUS
  Filled 2021-05-11 (×3): qty 100
  Filled 2021-05-11: qty 200
  Filled 2021-05-11 (×9): qty 100

## 2021-05-11 MED ORDER — KETAMINE 50 MG/ML INJECTION SOLUTION
50.0000 mg | Freq: Once | INTRAMUSCULAR | Status: DC
Start: 2021-05-11 — End: 2021-05-11

## 2021-05-11 MED ORDER — POTASSIUM CHLORIDE 20 MEQ/100ML IN STERILE WATER INTRAVENOUS PIGGYBACK
20.0000 meq | INJECTION | INTRAVENOUS | Status: AC | PRN
Start: 2021-05-11 — End: 2021-05-12
  Administered 2021-05-12: 20 meq via INTRAVENOUS
  Administered 2021-05-12: 0 meq via INTRAVENOUS
  Filled 2021-05-11: qty 100

## 2021-05-11 MED ORDER — FUROSEMIDE 10 MG/ML INJECTION SOLUTION
20.0000 mg | Freq: Three times a day (TID) | INTRAMUSCULAR | Status: DC
Start: 2021-05-11 — End: 2021-05-12
  Administered 2021-05-11 – 2021-05-12 (×3): 20 mg via INTRAVENOUS
  Filled 2021-05-11: qty 4
  Filled 2021-05-11: qty 2

## 2021-05-11 MED ORDER — POTASSIUM CHLORIDE 20 MEQ/100ML IN STERILE WATER INTRAVENOUS PIGGYBACK
20.0000 meq | INJECTION | INTRAVENOUS | Status: AC
Start: 2021-05-11 — End: 2021-05-11
  Administered 2021-05-11: 0 meq via INTRAVENOUS
  Administered 2021-05-11: 20 meq via INTRAVENOUS
  Administered 2021-05-11: 0 meq via INTRAVENOUS
  Administered 2021-05-11 (×2): 20 meq via INTRAVENOUS
  Administered 2021-05-11: 0 meq via INTRAVENOUS
  Filled 2021-05-11: qty 100

## 2021-05-11 MED ORDER — POTASSIUM CHLORIDE 20 MEQ/100ML IN STERILE WATER INTRAVENOUS PIGGYBACK
20.0000 meq | INJECTION | Freq: Once | INTRAVENOUS | Status: AC
Start: 2021-05-11 — End: 2021-05-11
  Administered 2021-05-11: 0 meq via INTRAVENOUS
  Administered 2021-05-11: 20 meq via INTRAVENOUS
  Filled 2021-05-11: qty 100

## 2021-05-11 NOTE — Care Plan (Signed)
Will continue to monitor and assess.  Will follow current POC and update as needed

## 2021-05-11 NOTE — Care Plan (Signed)
Problem: Adult Inpatient Plan of Care  Goal: Plan of Care Review  Outcome: Ongoing (see interventions/notes)  Goal: Patient-Specific Goal (Individualized)  Outcome: Ongoing (see interventions/notes)  Goal: Absence of Hospital-Acquired Illness or Injury  Outcome: Ongoing (see interventions/notes)  Intervention: Identify and Manage Fall Risk  Recent Flowsheet Documentation  Taken 05/10/2021 2000 by Evie Lacks, RN  Safety Promotion/Fall Prevention: safety round/check completed  Intervention: Prevent and Manage VTE (Venous Thromboembolism) Risk  Recent Flowsheet Documentation  Taken 05/10/2021 2000 by Evie Lacks, RN  VTE Prevention/Management: sequential compression devices on  Goal: Optimal Comfort and Wellbeing  Outcome: Ongoing (see interventions/notes)  Goal: Rounds/Family Conference  Outcome: Ongoing (see interventions/notes)     Problem: Fall Injury Risk  Goal: Absence of Fall and Fall-Related Injury  Outcome: Ongoing (see interventions/notes)  Intervention: Promote Injury-Free Environment  Recent Flowsheet Documentation  Taken 05/10/2021 2000 by Evie Lacks, RN  Safety Promotion/Fall Prevention: safety round/check completed     Problem: Pain Acute  Goal: Acceptable Pain Control and Functional Ability  Outcome: Ongoing (see interventions/notes)     Problem: Communication Impairment (Mechanical Ventilation, Invasive)  Goal: Effective Communication  Outcome: Ongoing (see interventions/notes)     Problem: Device-Related Complication Risk (Mechanical Ventilation, Invasive)  Goal: Optimal Device Function  Outcome: Ongoing (see interventions/notes)     Problem: Inability to Wean (Mechanical Ventilation, Invasive)  Goal: Mechanical Ventilation Liberation  Outcome: Ongoing (see interventions/notes)     Problem: Nutrition Impairment (Mechanical Ventilation, Invasive)  Goal: Optimal Nutrition Delivery  Outcome: Ongoing (see interventions/notes)     Problem: Skin and Tissue Injury (Mechanical  Ventilation, Invasive)  Goal: Absence of Device-Related Skin and Tissue Injury  Outcome: Ongoing (see interventions/notes)     Problem: Ventilator-Induced Lung Injury (Mechanical Ventilation, Invasive)  Goal: Absence of Ventilator-Induced Lung Injury  Outcome: Ongoing (see interventions/notes)     Problem: Non-violent/Non-Self Destructive Restraints  Goal: Alternative methods tried prior to restraints  Outcome: Ongoing (see interventions/notes)  Goal: Patient free from injury and discomfort  Outcome: Ongoing (see interventions/notes)  Goal: Autonomy maintained at the highest possible level  Outcome: Ongoing (see interventions/notes)  Goal: Need for restraints reassessed per policy  Outcome: Ongoing (see interventions/notes)  Goal: Patient education provided  Outcome: Ongoing (see interventions/notes)  Goal: Problem Interventions  Outcome: Ongoing (see interventions/notes)     Problem: Bleeding (Orthopaedic Fracture)  Goal: Absence of Bleeding  Outcome: Ongoing (see interventions/notes)     Problem: Embolism (Orthopaedic Fracture)  Goal: Absence of Embolism Signs and Symptoms  Outcome: Ongoing (see interventions/notes)  Intervention: Prevent or Manage Embolism Risk  Recent Flowsheet Documentation  Taken 05/10/2021 2000 by Evie Lacks, RN  VTE Prevention/Management: sequential compression devices on     Problem: Fracture Stabilization and Management (Orthopaedic Fracture)  Goal: Fracture Stability  Outcome: Ongoing (see interventions/notes)     Problem: Functional Ability Impaired (Orthopaedic Fracture)  Goal: Optimal Functional Ability  Outcome: Ongoing (see interventions/notes)     Problem: Infection (Orthopaedic Fracture)  Goal: Absence of Infection Signs and Symptoms  Outcome: Ongoing (see interventions/notes)  Intervention: Prevent or Manage Infection  Recent Flowsheet Documentation  Taken 05/10/2021 2000 by Evie Lacks, RN  Fever Reduction/Comfort Measures:   lightweight bedding   lightweight  clothing     Problem: Neurovascular Compromise (Orthopaedic Fracture)  Goal: Effective Tissue Perfusion  Outcome: Ongoing (see interventions/notes)     Problem: Pain (Orthopaedic Fracture)  Goal: Acceptable Pain Control  Outcome: Ongoing (see interventions/notes)     Problem: Respiratory Compromise (Orthopaedic Fracture)  Goal: Effective Oxygenation and Ventilation  Outcome: Ongoing (see interventions/notes)     Problem: ARDS (Acute Respiratory Distress Syndrome)  Goal: Effective Oxygenation  Outcome: Ongoing (see interventions/notes)     Problem: Adjustment to Injury (Multiple Trauma)  Goal: Optimal Coping with Effects of Injury  Outcome: Ongoing (see interventions/notes)     Problem: Bleeding (Multiple Trauma)  Goal: Absence of Bleeding  Outcome: Ongoing (see interventions/notes)     Problem: Cerebral Tissue Perfusion Risk (Multiple Trauma)  Goal: Effective Cerebral Tissue Perfusion  Outcome: Ongoing (see interventions/notes)     Problem: Fluid Imbalance (Multiple Trauma)  Goal: Fluid Balance  Outcome: Ongoing (see interventions/notes)     Problem: Functional Ability Impaired (Multiple Trauma)  Goal: Optimal Functional Ability  Outcome: Ongoing (see interventions/notes)     Problem: Infection (Multiple Trauma)  Goal: Absence of Infection Signs and Symptoms  Outcome: Ongoing (see interventions/notes)  Intervention: Prevent or Manage Infection  Recent Flowsheet Documentation  Taken 05/10/2021 2000 by Evie Lacks, RN  Fever Reduction/Comfort Measures:   lightweight bedding   lightweight clothing     Problem: Neurovascular Compromise (Multiple Trauma)  Goal: Effective Peripheral Tissue Perfusion  Outcome: Ongoing (see interventions/notes)     Problem: Pain (Multiple Trauma)  Goal: Acceptable Pain Control  Outcome: Ongoing (see interventions/notes)     Problem: Respiratory Compromise (Multiple Trauma)  Goal: Effective Oxygenation and Ventilation  Outcome: Ongoing (see interventions/notes)     Problem: Fluid  Imbalance (Pneumonia)  Goal: Fluid Balance  Outcome: Ongoing (see interventions/notes)     Problem: Infection (Pneumonia)  Goal: Resolution of Infection Signs and Symptoms  Outcome: Ongoing (see interventions/notes)  Intervention: Prevent Infection Progression  Recent Flowsheet Documentation  Taken 05/10/2021 2000 by Evie Lacks, RN  Fever Reduction/Comfort Measures:   lightweight bedding   lightweight clothing     Problem: Respiratory Compromise (Pneumonia)  Goal: Effective Oxygenation and Ventilation  Outcome: Ongoing (see interventions/notes)     Problem: Skin Injury Risk Increased  Goal: Skin Health and Integrity  Outcome: Ongoing (see interventions/notes)  Intervention: Optimize Skin Protection  Recent Flowsheet Documentation  Taken 05/11/2021 0000 by Evie Lacks, RN  Pressure Reduction Devices: (Bud) --  Taken 05/10/2021 2000 by Evie Lacks, RN  Pressure Reduction Devices: ([employer] SYSTEM) --  Skin Protection:   incontinence pads utilized   skin sealant/moisture barrier applied     Problem: Alcohol Withdrawal  Goal: Alcohol Withdrawal Symptom Control  Outcome: Ongoing (see interventions/notes)     Problem: Acute Neurologic Deterioration (Alcohol Withdrawal)  Goal: Optimal Neurologic Function  Outcome: Ongoing (see interventions/notes)     Problem: Substance Misuse (Alcohol Withdrawal)  Goal: Readiness for Change Identified  Outcome: Ongoing (see interventions/notes)

## 2021-05-11 NOTE — Progress Notes (Signed)
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 the carina. There is an NG tube in the stomach. There is a left PICC catheter in the SVC.   Heart size is moderately enlarged.     There are bilateral perihilar infiltrates   Mild right dorsal scoliosis        Impression    Persistent perihilar infiltrates/vascular congestion slightly worse on the left since yesterday      Radiologist location ID: [id number]     XR AP MOBILE CHEST     Status: None    Narrative    Phat LEE Lucy    RADIOLOGIST: Dorisann Frames, MD    XR AP MOBILE CHEST performed on 05/07/2021 6:11 AM    CLINICAL HISTORY: Trauma.  on vent     TECHNIQUE: Frontal view of the chest.    COMPARISON:  Yesterday    FINDINGS:  The support lines and tubes are in stable and satisfactory position.   Cardiac and mediastinal contours are stable.   No significant change in the appearance of the lungs.  Extensive bilateral pulmonary infiltrates persist with a moderate size left pleural effusion.        Impression    NO SIGNIFICANT CHANGE SINCE THE PRIOR EXAM.      Radiologist location ID: [id number]     [place] CHEST (EFFUSION)     Status: None    Narrative    Breydon LEE Liby    RADIOLOGIST: Colletta Maryland, MD    [place] CHEST (EFFUSION) performed on 05/07/2021 11:17 AM    CLINICAL HISTORY: pleural effusions..  check left pleural effusion    TECHNIQUE:  Ultrasound imaging of left chest.    COMPARISON:  None.    FINDINGS:  There is a moderate-sized  left pleural effusion.        Impression    LEFT PLEURAL EFFUSION        Radiologist location ID: [id number]     US THORACENTESIS     Status: None    Narrative    *Procedure not read by radiology.    *Please Refer to Procedure Note for result.   XR AP MOBILE CHEST     Status: None    Narrative    Beverley LEE Lott    RADIOLOGIST: Berton Bon, MD    XR AP MOBILE CHEST performed on 05/07/2021 4:40 PM    CLINICAL HISTORY: POST THORACENTESIS.  s/p left thoracentisis.     TECHNIQUE: Frontal view of the chest.    COMPARISON:  Previous exam from today    FINDINGS:  ET tube 5 cm above the carina. There is an NG tube in the stomach. There is a left PICC catheter in the SVC   Heart size is mildly enlarged.     There are perihilar and basilar infiltrates. Possible small effusions obscuring the diaphragms   There are multiple left lateral rib fracture deformities        Impression    Negative for pneumothorax after thoracentesis      Radiologist location ID: [id number]     XR AP MOBILE CHEST     Status: None    Narrative    Rollen LEE Lupien    RADIOLOGIST: Otho Najjar, MD    XR AP MOBILE CHEST performed on 05/09/2021 6:15 AM    CLINICAL HISTORY: Mult. L rib fx.s, effusion, intubated.  resp fail-vent, effusion, multiple rib fx's    TECHNIQUE: Frontal view of the chest.    COMPARISON:  05/07/2021    FINDINGS:  The support lines and tubes are in stable and satisfactory position. No pneumothorax.     There is increasing opacity bilaterally in the mid lung fields with infiltrate, effusion and vascular congestion. This could be inflammatory but the symmetry suggests development of pulmonary edema. Clinical correlation recommended.      Impression    Increasing opacity bilaterally suggesting increasing CHF. Clinical correlation recommended.      Radiologist location ID: [id number]     XR AP MOBILE CHEST     Status: None    Narrative    Gaylen LEE Phillips    RADIOLOGIST: Colletta Maryland, MD    XR AP MOBILE CHEST performed on 05/10/2021 6:19  AM    CLINICAL HISTORY: respiratory failure.  resp fail-vent    TECHNIQUE: Frontal view of the chest.    COMPARISON:  05/09/2021    FINDINGS:  The support lines and tubes are in stable and satisfactory position.   Cardiac and mediastinal contours are stable.   Airspace disease is present in the mid to  lower lungs with probable bilateral pleural effusions. Lung volumes are slightly improved since yesterday           Impression    BILATERAL AIRSPACE DISEASE AND PLEURAL EFFUSIONS WITH SOME IMPROVEMENT SINCE YESTERDAY      Radiologist location ID: [id number]     CT ANGIO CHEST FOR PULMONARY EMBOLUS W IV CONTRAST     Status: None    Narrative    Tyquavious LEE Tatem    RADIOLOGIST: Colletta Maryland, MD    CT ANGIO CHEST FOR PULMONARY EMBOLUS W IV CONTRAST performed on 05/10/2021 11:49 AM    CLINICAL HISTORY: hypoxia.  on the Vent , increased hypoxia. CXR showed increased opacities  Increase in CHF  Lung sounds coarse  . fall on 05-01-21    TECHNIQUE: CTA imaging of the chest with intravenous contrast.  3D reconstructions.  CONTRAST:  100 ml's of Isovue 370    COMPARISON: 05/03/2021  # of known CTs in the past 12 months: 7   # of known Cardiac Nuclear Medicine Studies in the past 12 months: 0         FINDINGS:  Hardware:  There is an NG tube in the stomach. An ET tube is present in the trachea. There is a left-sided PICC line in the SVC.    Lymph nodes:   No mediastinal, hilar, or axillary lymphadenopathy.    Heart:  Coronary artery calcifications are noted.        RV/LV Diameter Ratio: N/A    Thoracic Aorta:  No thoracic aortic aneurysm or dissection.    Pulmonary Vessels:  No evidence of acute pulmonary emboli through the major subsegmental branches.    Lungs and Airways:  There is patient respiratory motion artifact which limits evaluation of the lungs.      Bilateral lower lobe airspace disease with air bronchograms. Pneumonia versus atelectasis.      Pleura: Bilateral pleural effusions left larger than right    Upper Abdomen:  Cirrhosis with splenomegaly. Small volume of ascites. Probable cholelithiasis.    Bones: Bone windows are unremarkable.        Impression    1.NO PULMONARY EMBOLUS  2.BILATERAL PLEURAL EFFUSIONS  3.BILATERAL LOWER LOBE AIRSPACE DISEASE. PNEUMONIA VERSUS ATELECTASIS      One or more dose reduction techniques were used (e.g., Automated exposure control, adjustment of the mA and/or kV according to patient size, use of iterative reconstruction technique).      Radiologist location ID: [id number]     XR AP MOBILE CHEST     Status: None    Narrative    Woodward LEE Indelicato    RADIOLOGIST: Colletta Maryland, MD    XR AP MOBILE CHEST performed on 05/11/2021 5:35 AM    CLINICAL HISTORY: respiratory failure.  resp fail-vent    TECHNIQUE: Frontal view of the chest.    COMPARISON:  Yesterday    FINDINGS:  The support lines and tubes are in stable and satisfactory position.   Cardiac and mediastinal contours are stable.   Bilateral airspace disease with bilateral pleural effusions similar to yesterday           Impression    NO ACUTE FINDINGS.      Radiologist location ID: [id number]        All current radiology studies were reviewed.    PHYSICAL EXAMINATION  Temperature: 36.8 C (98.2 F)  Heart Rate: 67  BP (Non-Invasive): 132/69  Respiratory Rate: (!) 25  SpO2: 95 %  General: 60 yo male sedated and maintained on MV   HEENT: Normocephalic.  Oral endotracheal tube and orogastric tube in place  Neck: Trach midline, supple   Heart: Sinus rhythm, HR 80's. 2+ pitting edema. Pulses 2+. Brisk capillary refill   Lungs: Maintained on mechanical ventilation. Breath sounds with rhonchi throughout, coarse. Small thick, white secretions from ETT currently  Abdomen: Soft, round, non-tender on palpation, BS normoactive, OGT with tube feedings infusing/ tolerating. BM 5/27, soft/ brown  Renal: Foley catheter draining amber urine to gravity   Extremities: 1+ edema noted to b/l hands, 2+ pitting edema to BLE.   Skin: Warm and dry  Neuro: Sedated  with current RASS -3. When decreased sedation for goal RASS 0 to -1, becomes agitated. PERRL  Psych: Sedated     VENTILATOR/SETTINGS:       OET= Size 7.5 mm; taped at 26 cm       Mode:  Assist control       Tidal Volume:  500       Ventilator Rate:  22       FI02:  60%       PEEP:  10 cm       Peak air pressure is 26 cm and mean airway pressure 12       PROBLEM LIST:   Active Hospital Problems   (*Primary Problem)    Diagnosis   . *Spleen injury   . On mechanically assisted ventilation (CMS HCC)   . Multiple rib fractures   . Left pulmonary contusion   . Closed fracture of transverse process of thoracic vertebra (CMS HCC)   . 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 Thrombocytopenia (CMS HCC)   . Alcohol abuse      NEURO:  - Hx alcoholism  - S/p trauma with fall down 11 stairs while intoxicated   - Sedated with propofol and no s/s withdrawal. On multivitamin and thiamine.   - CTH: No acute intracranial findings. Left posterior occipital scalp hematoma  - CT C-spine and lumbar spine negative   - CT thoracic spine: Multiple rib fractures (medial left 4th- 6th and sixth ribs and posterior lateral left 4th-7th . Possible non-displaced left transverse process fractures of T5 and T6   - Analgesic: Fentanyl infusion, Lidoderm patch   - Seroquel and Serax discontinued 5/27  - Daily sedation vacation for neuro assessments; currently not tolerating    RESP:  - Acute respiratory failure with hypoxia ->transferred to ICU on 5/19 for intubation   - CTA chest: small left pneumothorax <5% resolved on follow up the following day. Rt>Lt. Trace left pleural effusion. Multiple displaced and comminuted left rib fractures. SubQ air left chest wall extending into the left flank  - CT with possible small bilat LL collapse/plugging - 05/05/21 bronch with multiple small casts from BLL - repeat CXR with no change. No cultures obtained at that time.  - Worsening hypoxia with pO2 60's, peep increased to 10 on 5/27 -> Covid swab and  CTA r/o PE -> both negative. CTA with persistent b/l effusions, may require thoracentesis in the future  - DuoNebs every 4 hours as needed  - Strep pneumo PNA  -> Unasyn continued and D#8  - Serial CXRs and ABGs  - SBT 5/28 with NIF -33, RSBI 44 and rapid desaturation    CVS:  - Hx HTN  - Hypertensive ->resolved while sedated  - PRN Lopressor  - Routine hemodynamic monitoring and continuous telemetry  GI:  - CTA abdomen/ pelvis: Grade 3 splenic injury. Cirrhosis with evidence of portal hypertension  - Repeat CT A/Pon5/20: Stable splenic laceration  - Continue daily H/H's if he bleeds he will require embolization  - NPO  -Tube feeding, tolerating at goal   -Prophylaxis: IV Pepcid  - Bowel regimen: Colace and Senna    Renal:  - Received 80 mg Lasix x 1 on 5/27 with total 3L UO in 24 hrs.  - Lasix 20 mg TID with good diuresis. Remains positive LOS   - Foley catheter for strict I/O's  - Monitor and correct electrolyte imbalance as needed  - Serial BMPs    HEME:  - No active bleeding noted; H/H stable  - Thrombocytopeniacontinue to trend, plt 90  - SQ Lovenox andSCD's for prophylaxis  - Serial CBC differential    ID:  - WBC3.8  - Pneumonia -> Unasyn D#8 (Unasyn dose increased 5/21)  - Sputum culture growing Haemophilis and Kelbsiella as of 5/23 and and now with strep pneumoniae  - Urine strep pneumo and legionellanegative  - Procalcitonin0.10  -Blood cultures negative  - UAwith no signs of infective source  - Vaccinations ordered, not given yet    ENDO:  - TSH4.13and HA1C5.3  - POCT glucometer Q 6 hours, add SSI as needed for optimal glycemic controland will continue adjust as necessary to achieve optimal BS levels    Prophylaxis:  -Lovenox, SCDs, Pepcid, Peridex,HOB > 30, pulmonary hygiene    Code Status:  Full Code     Critical Care Time: 44 minutes    I have personally examined the patient at the bedside; reviewed the medical chart, laboratory data, radiologic imaging and discussed  patient's case at bedside with multidisciplinary critical care team. Critical Care excludes any bedside procedures or teaching. Dr. Juanda Chance available for collaboration at all times.  Tiffany M. Bright, APRN, AGACNP-BC

## 2021-05-11 NOTE — Progress Notes (Signed)
[facility] were consulted to work with pt for ETOH. At this time, pt is still on a ventilator. [facility] will follow up 05/15/2021 to check on status of pt, in hopes of completing initial BI.

## 2021-05-11 NOTE — Progress Notes (Addendum)
Name: Hector [name] MRN:  [id number]   Date of admission: 05/01/2021 Age: 60 y.o.       Name - Hector [name]    Date of service - 05/11/2021      Interval history  The patient was having increasing FiO2 and PEEP requirements yesterday.  He underwent a CTA of the chest to rule out PE which was negative.  Patient remains intubated in the ICU.  Peep this morning is 10. He is tolerating tube feeds at goal of 60 cc an hour.  Hemodynamically stable.  Afebrile    Physical exam  Filed Vitals:    05/11/21 0400 05/11/21 0500 05/11/21 0530 05/11/21 0635   BP: (!) 151/74 (!) 140/79 132/69    Pulse: 72 70 67    Resp: (!) 26 (!) 24 (!) 25    Temp:       SpO2: 95% 95% 96% 96%          Intake/Output Summary (Last 24 hours) at 05/11/2021 0935  Last data filed at 05/11/2021 0600  Gross per 24 hour   Intake 392.07 ml   Output 3530 ml   Net -3137.93 ml          General:Intubated in the ICU with sedation  Abdomen: soft, nontender, nondistended,small umbilical hernia  Skin: No jaundice, lesions, or rashes.  Lungs:On the ventilator   CV: Hemodynamically stable  Extremities:Patient appears to have fluid overload in all extremities but improved      Labs/Imaging -     CT ANGIO CHEST FOR PULMONARY EMBOLUS W IV CONTRAST performed on 05/10/2021 11:49 AM     CLINICAL HISTORY: hypoxia.   on the Vent , increased hypoxia. CXR showed increased opacities Increase in CHF Lung sounds coarse . fall on 05-01-21     TECHNIQUE: CTA imaging of the chest with intravenous contrast. 3D reconstructions.   CONTRAST: 100 ml's of Isovue 370     COMPARISON: 05/03/2021   # of known CTs in the past 12 months: 7   # of known Cardiac Nuclear Medicine Studies in the past 12 months: 0       FINDINGS:   Hardware: There is an NG tube in the stomach. An ET tube is present in the trachea. There is a left-sided PICC line in the SVC.     Lymph nodes:  No mediastinal, hilar, or axillary lymphadenopathy.     Heart: Coronary artery calcifications are  noted.     RV/LV Diameter Ratio: N/A     Thoracic Aorta: No thoracic aortic aneurysm or dissection.     Pulmonary Vessels: No evidence of acute pulmonary emboli through the major subsegmental branches.     Lungs and Airways: There is patient respiratory motion artifact which limits evaluation of the lungs.       Bilateral lower lobe airspace disease with air bronchograms. Pneumonia versus atelectasis.       Pleura: Bilateral pleural effusions left larger than right     Upper Abdomen: Cirrhosis with splenomegaly. Small volume of ascites. Probable cholelithiasis.     Bones: Bone windows are unremarkable.       Impression   1.NO PULMONARY EMBOLUS   2.BILATERAL PLEURAL EFFUSIONS   3.BILATERAL LOWER LOBE AIRSPACE DISEASE. PNEUMONIA VERSUS ATELECTASIS               ASSESSMENT & RECOMMENDATIONS:   60 y.o.malestatus post fall downstairs sustaining multiple left-sided rib fractures with subcutaneous emphysema,transverse process fracture of thoracic spine,and grade 3 splenic laceration  -  weaning the patient from the ventilator is becoming a challenge with increasing FiO2 and PEEP requirements yesterday...  We will continue with vent weaning  - part of the issue is when the patient is on a sedation holiday he does not tolerate it well, he is currently on Precedex and fentanyl...  Patient does have a history of extensive alcohol use  -antibiotics(Unasyn)for the strep pneumo pneumonia  -continue with Lasix diuresis  -Lovenox for DVT prophylaxis  -continue tube feeding at goal    Clinton Quant, MD      Note: A portion of this documentation was generated using MMODAL (voice recognition software) and may contain syntax/voice recognition errors.

## 2021-05-12 ENCOUNTER — Encounter (HOSPITAL_COMMUNITY): Payer: Self-pay | Admitting: Surgery

## 2021-05-12 ENCOUNTER — Ambulatory Visit (HOSPITAL_COMMUNITY)
Admission: RE | Admit: 2021-05-12 | Discharge: 2021-05-12 | Disposition: A | Discharge status: Home / Self Care | Payer: MEDICAID | Source: Ambulatory Visit

## 2021-05-12 ENCOUNTER — Inpatient Hospital Stay (HOSPITAL_COMMUNITY): Payer: MEDICAID

## 2021-05-12 DIAGNOSIS — J90 Pleural effusion, not elsewhere classified: Secondary | ICD-10-CM | POA: Insufficient documentation

## 2021-05-12 DIAGNOSIS — D696 Thrombocytopenia, unspecified: Secondary | ICD-10-CM

## 2021-05-12 DIAGNOSIS — W109XXA Fall (on) (from) unspecified stairs and steps, initial encounter: Secondary | ICD-10-CM

## 2021-05-12 DIAGNOSIS — K7460 Unspecified cirrhosis of liver: Secondary | ICD-10-CM

## 2021-05-12 DIAGNOSIS — F1010 Alcohol abuse, uncomplicated: Secondary | ICD-10-CM

## 2021-05-12 DIAGNOSIS — K766 Portal hypertension: Secondary | ICD-10-CM

## 2021-05-12 DIAGNOSIS — J9690 Respiratory failure, unspecified, unspecified whether with hypoxia or hypercapnia: Secondary | ICD-10-CM | POA: Insufficient documentation

## 2021-05-12 DIAGNOSIS — G929 Unspecified toxic encephalopathy: Secondary | ICD-10-CM

## 2021-05-12 DIAGNOSIS — Z9911 Dependence on respirator [ventilator] status: Secondary | ICD-10-CM

## 2021-05-12 DIAGNOSIS — S22009D Unspecified fracture of unspecified thoracic vertebra, subsequent encounter for fracture with routine healing: Secondary | ICD-10-CM

## 2021-05-12 DIAGNOSIS — Z960 Presence of urogenital implants: Secondary | ICD-10-CM

## 2021-05-12 DIAGNOSIS — S36039A Unspecified laceration of spleen, initial encounter: Secondary | ICD-10-CM

## 2021-05-12 LAB — ARTERIAL BLOOD GAS, CO-OX, LYTES, LACTATE REFLEX
%FIO2 (ARTERIAL): 60 %
(T) PCO2: 38 mm/Hg (ref 35.0–45.0)
(T) PO2: 88 mm/Hg (ref 72.0–100.0)
BASE EXCESS (ARTERIAL): 4.6 mmol/L — ABNORMAL HIGH (ref 0.0–1.0)
BICARBONATE (ARTERIAL): 28.5 mmol/L — ABNORMAL HIGH (ref 18.0–26.0)
CARBOXYHEMOGLOBIN: 1.8 % (ref 0.0–2.5)
CHLORIDE: 112 mmol/L — ABNORMAL HIGH (ref 101–111)
GLUCOSE: 126 mg/dL — ABNORMAL HIGH (ref 60–105)
HEMATOCRITRT: 40 %
HEMOGLOBIN: 13.3 g/dL (ref 12.0–18.0)
IONIZED CALCIUM: 1.16 mmol/L (ref 1.10–1.35)
LACTATE: 0.8 mmol/L (ref 0.0–1.3)
MET-HEMOGLOBIN: 1.1 % (ref 0.0–2.0)
O2CT: 17.9 % (ref 15.7–24.3)
OXYHEMOGLOBIN: 95.3 % (ref 85.0–98.0)
PAO2/FIO2 RATIO: 147 (ref <=200)
PCO2 (ARTERIAL): 38 mm/Hg (ref 35–45)
PH (ARTERIAL): 7.48 — ABNORMAL HIGH (ref 7.35–7.45)
PH (T): 7.48 — ABNORMAL HIGH (ref 7.35–7.45)
PO2 (ARTERIAL): 88 mm/Hg (ref 72–100)
SODIUM: 145 mmol/L (ref 137–145)
TEMPERATURE, COMP: 37 C
WHOLE BLOOD POTASSIUM: 3.8 mmol/L (ref 3.5–4.6)

## 2021-05-12 LAB — ALT (SGPT): ALT (SGPT): 45 U/L (ref <50)

## 2021-05-12 LAB — POTASSIUM
POTASSIUM: 3.1 mmol/L — ABNORMAL LOW (ref 3.5–5.1)
POTASSIUM: 3.2 mmol/L — ABNORMAL LOW (ref 3.5–5.1)
POTASSIUM: 3.6 mmol/L (ref 3.5–5.1)
POTASSIUM: 3.7 mmol/L (ref 3.5–5.1)
POTASSIUM: 3.7 mmol/L (ref 3.5–5.1)
POTASSIUM: 3.8 mmol/L (ref 3.5–5.1)

## 2021-05-12 LAB — CBC
HCT: 33.1 % — ABNORMAL LOW (ref 36.0–46.0)
HGB: 10.8 g/dL — ABNORMAL LOW (ref 13.9–16.3)
MCH: 33.5 pg (ref 25.4–34.0)
MCHC: 32.7 g/dL (ref 30.0–37.0)
MCV: 102.5 fL — ABNORMAL HIGH (ref 80.0–100.0)
MPV: 10.3 fL (ref 7.5–11.5)
PLATELETS: 90 10*3/uL — ABNORMAL LOW (ref 130–400)
RBC: 3.23 10*6/uL — ABNORMAL LOW (ref 4.30–5.90)
RDW: 15.6 % — ABNORMAL HIGH (ref 11.5–14.0)
WBC: 4.5 10*3/uL (ref 4.5–11.5)

## 2021-05-12 LAB — MAGNESIUM
MAGNESIUM: 2.3 mg/dL (ref 1.6–2.3)
MAGNESIUM: 2.2 mg/dL (ref 1.6–2.3)

## 2021-05-12 LAB — PHOSPHORUS: PHOSPHORUS: 3.8 mg/dL (ref 2.5–4.5)

## 2021-05-12 LAB — BASIC METABOLIC PANEL
ANION GAP: 3 mmol/L — ABNORMAL LOW (ref 5–19)
BUN/CREA RATIO: 60 — ABNORMAL HIGH (ref 6–20)
BUN: 28 mg/dL — ABNORMAL HIGH (ref 9–20)
CALCIUM: 8.1 mg/dL — ABNORMAL LOW (ref 8.4–10.2)
CHLORIDE: 114 mmol/L — ABNORMAL HIGH (ref 98–107)
CO2 TOTAL: 28 mmol/L (ref 22–30)
CREATININE: 0.47 mg/dL — ABNORMAL LOW (ref 0.66–1.20)
ESTIMATED GFR: >60 mL/min/{1.73_m2} (ref >60)
GLUCOSE: 128 mg/dL — ABNORMAL HIGH (ref 74–106)
POTASSIUM: 3.7 mmol/L (ref 3.5–5.1)
SODIUM: 145 mmol/L (ref 137–145)

## 2021-05-12 LAB — TOTAL PROTEIN: PROTEIN TOTAL: 6.5 g/dL (ref 6.3–8.2)

## 2021-05-12 LAB — AST (SGOT): AST (SGOT): 70 U/L — ABNORMAL HIGH (ref 17–59)

## 2021-05-12 LAB — BILIRUBIN TOTAL: BILIRUBIN TOTAL: 1.7 mg/dL — ABNORMAL HIGH (ref 0.2–1.3)

## 2021-05-12 LAB — RESPIRATORY CULTURE AND GRAM STAIN (PERFORMABLE)

## 2021-05-12 LAB — GAMMA GT: GGT: 405 U/L — ABNORMAL HIGH (ref 15–73)

## 2021-05-12 LAB — BUN: BUN: 27 mg/dL — ABNORMAL HIGH (ref 9–20)

## 2021-05-12 LAB — ALK PHOS (ALKALINE PHOSPHATASE): ALKALINE PHOSPHATASE: 167 U/L — ABNORMAL HIGH (ref 38–126)

## 2021-05-12 LAB — AMMONIA: AMMONIA: 54 umol/L — ABNORMAL HIGH (ref 9–30)

## 2021-05-12 MED ORDER — OLANZAPINE 10 MG INTRAMUSCULAR SOLUTION
5.0000 mg | Freq: Two times a day (BID) | INTRAMUSCULAR | Status: DC | PRN
Start: 2021-05-12 — End: 2021-05-13
  Administered 2021-05-13: 5 mg via INTRAMUSCULAR
  Filled 2021-05-12: qty 1

## 2021-05-12 MED ORDER — POTASSIUM CHLORIDE 20 MEQ/100ML IN STERILE WATER INTRAVENOUS PIGGYBACK
20.0000 meq | INJECTION | INTRAVENOUS | Status: AC
Start: 2021-05-12 — End: 2021-05-12
  Administered 2021-05-12 (×2): 0 meq via INTRAVENOUS
  Administered 2021-05-12 (×2): 20 meq via INTRAVENOUS
  Filled 2021-05-12 (×2): qty 100

## 2021-05-12 MED ORDER — LACTULOSE 20 GRAM/30 ML ORAL SOLUTION
30.0000 mL | Freq: Four times a day (QID) | ORAL | Status: DC
Start: 2021-05-12 — End: 2021-05-13
  Administered 2021-05-12 – 2021-05-13 (×3): 30 mL via ORAL
  Filled 2021-05-12 (×3): qty 30

## 2021-05-12 MED ORDER — FUROSEMIDE 10 MG/ML INJECTION SOLUTION
40.0000 mg | Freq: Three times a day (TID) | INTRAMUSCULAR | Status: DC
Start: 2021-05-12 — End: 2021-05-12

## 2021-05-12 MED ORDER — POTASSIUM CHLORIDE 20 MEQ/100ML IN STERILE WATER INTRAVENOUS PIGGYBACK
20.0000 meq | INJECTION | INTRAVENOUS | Status: AC | PRN
Start: 2021-05-12 — End: 2021-05-14
  Administered 2021-05-12: 0 meq via INTRAVENOUS
  Administered 2021-05-12 (×2): 20 meq via INTRAVENOUS
  Administered 2021-05-12 – 2021-05-13 (×3): 0 meq via INTRAVENOUS
  Administered 2021-05-13: 20 meq via INTRAVENOUS
  Administered 2021-05-13: 0 meq via INTRAVENOUS
  Administered 2021-05-13 – 2021-05-14 (×3): 20 meq via INTRAVENOUS
  Administered 2021-05-14 (×2): 0 meq via INTRAVENOUS
  Administered 2021-05-14: 20 meq via INTRAVENOUS
  Filled 2021-05-12: qty 100

## 2021-05-12 MED ORDER — ALBUMIN, HUMAN 25 % INTRAVENOUS SOLUTION
12.5000 g | INTRAVENOUS | Status: DC
Start: 2021-05-12 — End: 2021-05-12
  Administered 2021-05-12: 0 g via INTRAVENOUS

## 2021-05-12 MED ORDER — POTASSIUM CHLORIDE 20 MEQ/100ML IN STERILE WATER INTRAVENOUS PIGGYBACK
20.0000 meq | INJECTION | INTRAVENOUS | Status: AC
Start: 2021-05-12 — End: 2021-05-12
  Administered 2021-05-12 (×2): 0 meq via INTRAVENOUS
  Administered 2021-05-12 (×2): 20 meq via INTRAVENOUS

## 2021-05-12 MED ORDER — WATER FOR INJECTION, STERILE INJECTION SOLUTION
2.1000 mL | Freq: Every day | INTRAMUSCULAR | Status: DC
Start: 2021-05-13 — End: 2021-05-13
  Filled 2021-05-12: qty 2.1

## 2021-05-12 MED ORDER — FUROSEMIDE 10 MG/ML INJECTION SOLUTION
40.0000 mg | Freq: Three times a day (TID) | INTRAMUSCULAR | Status: DC
Start: 2021-05-12 — End: 2021-05-16
  Administered 2021-05-12 – 2021-05-14 (×8): 40 mg via INTRAVENOUS
  Administered 2021-05-15 (×2): 0 mg via INTRAVENOUS
  Administered 2021-05-15: 40 mg via INTRAVENOUS
  Administered 2021-05-16 (×3): 0 mg via INTRAVENOUS
  Filled 2021-05-12 (×10): qty 4

## 2021-05-12 MED ORDER — ALBUMIN, HUMAN 25 % INTRAVENOUS SOLUTION
12.5000 g | Freq: Three times a day (TID) | INTRAVENOUS | Status: AC
Start: 2021-05-12 — End: 2021-05-13
  Administered 2021-05-12: 0 g via INTRAVENOUS
  Administered 2021-05-12: 12.5 g via INTRAVENOUS
  Administered 2021-05-12: 0 g via INTRAVENOUS
  Administered 2021-05-12: 12.5 g via INTRAVENOUS
  Administered 2021-05-13: 0 g via INTRAVENOUS
  Administered 2021-05-13: 12.5 g via INTRAVENOUS
  Filled 2021-05-12 (×3): qty 50

## 2021-05-12 MED ORDER — OLANZAPINE 10 MG TABLET
10.0000 mg | ORAL_TABLET | Freq: Every day | ORAL | Status: DC
Start: 2021-05-12 — End: 2021-05-30
  Administered 2021-05-12 – 2021-05-20 (×8): 10 mg via ORAL
  Administered 2021-05-21: 0 mg via ORAL
  Administered 2021-05-22 – 2021-05-29 (×8): 10 mg via ORAL
  Filled 2021-05-12 (×18): qty 1

## 2021-05-12 MED ORDER — MIDAZOLAM 1 MG/ML INJECTION WRAPPER
2.0000 mg | Freq: Once | INTRAMUSCULAR | Status: AC
Start: 2021-05-12 — End: 2021-05-12
  Administered 2021-05-12 (×2): 2 mg via INTRAVENOUS
  Filled 2021-05-12: qty 2

## 2021-05-12 NOTE — Progress Notes (Signed)
Name: Hector [name] MRN:  [id number]   Date of admission: 05/01/2021 Age: 60 y.o.       Name - Hector [name]    Date of service - 05/12/2021      Interval history  The patient had a T-max of 38.3 C which is new.  He has been re-cultured.  The patient remains intubated in the ICU.  The patient is still requiring high PEEP level of 10.  The patient on sedation holiday does not follow commands or respond appropriately so he underwent a CT of the head that was negative.  Ammonia level has been ordered.  Patient has been tolerating tube feeds at goal.    Physical exam  Filed Vitals:    05/12/21 0700 05/12/21 0800 05/12/21 0839 05/12/21 0900   BP: (!) 178/85 (!) 204/114 (!) 162/90 131/72   Pulse: 84 99 79 74   Resp: (!) 24 (!) 30 (!) 22 19   Temp:  (!) 38.3 C (100.9 F)     SpO2: 97% 96% 95% 95%          Intake/Output Summary (Last 24 hours) at 05/12/2021 0931  Last data filed at 05/12/2021 0900  Gross per 24 hour   Intake 4037.31 ml   Output 5270 ml   Net -1232.69 ml          General:Intubated in the ICU with sedation  Abdomen: soft, nontender, nondistended,small umbilical hernia  Skin: No jaundice, lesions, or rashes.  Lungs:On the ventilator   CV: Hemodynamically stable  Extremities:Patient appears to have fluid overload in all extremities      Labs/Imaging -     XR AP MOBILE CHEST performed on 05/12/2021 6:20 AM    CLINICAL HISTORY: respiratory failure.  vent management     TECHNIQUE: Frontal view of the chest.    COMPARISON:  Yesterday    FINDINGS:  The support lines and tubes are in stable and satisfactory position.   Cardiac and mediastinal contours are stable.   There is a left pleural effusion and bilateral airspace disease with possible right pleural effusion. Mild improvement in the right lung since yesterday.         IMPRESSION:  BILATERAL AIRSPACE DISEASE AND PLEURAL EFFUSIONS LEFT WORSE          CT BRAIN WO IV CONTRAST performed on 05/12/2021 2:27 AM    CLINICAL HISTORY: altered  mental status.  Pt intubated and per RN very restless this early AM    TECHNIQUE:  Head CT without intravenous contrast.    COMPARISON: 05/01/2021  # of known CTs in the past 12 months: 8   # of known Cardiac Nuclear Medicine Studies in the past 12 months: 0    FINDINGS: This study is degraded by patient motion artifact  There is no acute intracranial hemorrhage, mass effect, or evidence of large acute infarct.    Brain: Low density in the periventricular white matter suggests mild chronic small vessel ischemic changes.    CSF Spaces: Mild generalized cerebral atrophy     Sinuses/Mastoids:  Clear at visualized levels     Bones: Unremarkable      IMPRESSION:  CHRONIC CHANGES.  NO ACUTE FINDINGS.         ASSESSMENT & RECOMMENDATIONS:   60 y.o.malestatus post fall downstairs sustaining multiple left-sided rib fractures with subcutaneous emphysema,transverse process fracture of thoracic spine,and grade 3 splenic laceration  - weaning the patient from the ventilator is becoming a challenge with increasing FiO2 and  PEEP requirements...  We will continue with vent weaning  - CTA of the chest as well as daily chest x-rays demonstrated reaccumulation of the pleural effusion worse on the left..  Continue with diuresis for this however he may need a repeat thoracentesis  -  patient is very restless and anxious on sedation holiday and does not follow commands...  CT of the head was negative today, we are checking ammonia levels, this may be drug induced or metabolic encephalopathy  -antibiotics(Unasyn)for the strep pneumo pneumonia...  Patient had a new fever today and has been re-cultured  -continue with Lasix diuresis  -Lovenox for DVT prophylaxis  -continue tube feeding at goal  - appreciate the assistance of the ICU team    Clinton Quant, MD      Note: A portion of this documentation was generated using MMODAL (voice recognition software) and may contain syntax/voice recognition errors.

## 2021-05-12 NOTE — Care Plan (Signed)
Problem: Adult Inpatient Plan of Care  Goal: Plan of Care Review  Outcome: Ongoing (see interventions/notes)  Goal: Patient-Specific Goal (Individualized)  Outcome: Ongoing (see interventions/notes)  Goal: Absence of Hospital-Acquired Illness or Injury  Outcome: Ongoing (see interventions/notes)  Goal: Optimal Comfort and Wellbeing  Outcome: Ongoing (see interventions/notes)  Goal: Rounds/Family Conference  Outcome: Ongoing (see interventions/notes)     Problem: Fall Injury Risk  Goal: Absence of Fall and Fall-Related Injury  Outcome: Ongoing (see interventions/notes)     Problem: Pain Acute  Goal: Acceptable Pain Control and Functional Ability  Outcome: Ongoing (see interventions/notes)     Problem: Communication Impairment (Mechanical Ventilation, Invasive)  Goal: Effective Communication  Outcome: Ongoing (see interventions/notes)     Problem: Device-Related Complication Risk (Mechanical Ventilation, Invasive)  Goal: Optimal Device Function  Outcome: Ongoing (see interventions/notes)     Problem: Inability to Wean (Mechanical Ventilation, Invasive)  Goal: Mechanical Ventilation Liberation  Outcome: Ongoing (see interventions/notes)     Problem: Nutrition Impairment (Mechanical Ventilation, Invasive)  Goal: Optimal Nutrition Delivery  Outcome: Ongoing (see interventions/notes)     Problem: Skin and Tissue Injury (Mechanical Ventilation, Invasive)  Goal: Absence of Device-Related Skin and Tissue Injury  Outcome: Ongoing (see interventions/notes)     Problem: Ventilator-Induced Lung Injury (Mechanical Ventilation, Invasive)  Goal: Absence of Ventilator-Induced Lung Injury  Outcome: Ongoing (see interventions/notes)     Problem: Non-violent/Non-Self Destructive Restraints  Goal: Alternative methods tried prior to restraints  Outcome: Ongoing (see interventions/notes)  Goal: Patient free from injury and discomfort  Outcome: Ongoing (see interventions/notes)  Goal: Autonomy maintained at the highest possible  level  Outcome: Ongoing (see interventions/notes)  Goal: Need for restraints reassessed per policy  Outcome: Ongoing (see interventions/notes)  Goal: Patient education provided  Outcome: Ongoing (see interventions/notes)  Goal: Problem Interventions  Outcome: Ongoing (see interventions/notes)     Problem: Bleeding (Orthopaedic Fracture)  Goal: Absence of Bleeding  Outcome: Ongoing (see interventions/notes)     Problem: Embolism (Orthopaedic Fracture)  Goal: Absence of Embolism Signs and Symptoms  Outcome: Ongoing (see interventions/notes)     Problem: Fracture Stabilization and Management (Orthopaedic Fracture)  Goal: Fracture Stability  Outcome: Ongoing (see interventions/notes)     Problem: Functional Ability Impaired (Orthopaedic Fracture)  Goal: Optimal Functional Ability  Outcome: Ongoing (see interventions/notes)     Problem: Infection (Orthopaedic Fracture)  Goal: Absence of Infection Signs and Symptoms  Outcome: Ongoing (see interventions/notes)     Problem: Neurovascular Compromise (Orthopaedic Fracture)  Goal: Effective Tissue Perfusion  Outcome: Ongoing (see interventions/notes)     Problem: Pain (Orthopaedic Fracture)  Goal: Acceptable Pain Control  Outcome: Ongoing (see interventions/notes)     Problem: Respiratory Compromise (Orthopaedic Fracture)  Goal: Effective Oxygenation and Ventilation  Outcome: Ongoing (see interventions/notes)     Problem: ARDS (Acute Respiratory Distress Syndrome)  Goal: Effective Oxygenation  Outcome: Ongoing (see interventions/notes)     Problem: Adjustment to Injury (Multiple Trauma)  Goal: Optimal Coping with Effects of Injury  Outcome: Ongoing (see interventions/notes)     Problem: Bleeding (Multiple Trauma)  Goal: Absence of Bleeding  Outcome: Ongoing (see interventions/notes)     Problem: Cerebral Tissue Perfusion Risk (Multiple Trauma)  Goal: Effective Cerebral Tissue Perfusion  Outcome: Ongoing (see interventions/notes)     Problem: Fluid Imbalance (Multiple  Trauma)  Goal: Fluid Balance  Outcome: Ongoing (see interventions/notes)     Problem: Functional Ability Impaired (Multiple Trauma)  Goal: Optimal Functional Ability  Outcome: Ongoing (see interventions/notes)     Problem: Infection (Multiple Trauma)  Goal:  Absence of Infection Signs and Symptoms  Outcome: Ongoing (see interventions/notes)     Problem: Neurovascular Compromise (Multiple Trauma)  Goal: Effective Peripheral Tissue Perfusion  Outcome: Ongoing (see interventions/notes)     Problem: Pain (Multiple Trauma)  Goal: Acceptable Pain Control  Outcome: Ongoing (see interventions/notes)     Problem: Respiratory Compromise (Multiple Trauma)  Goal: Effective Oxygenation and Ventilation  Outcome: Ongoing (see interventions/notes)     Problem: Fluid Imbalance (Pneumonia)  Goal: Fluid Balance  Outcome: Ongoing (see interventions/notes)     Problem: Infection (Pneumonia)  Goal: Resolution of Infection Signs and Symptoms  Outcome: Ongoing (see interventions/notes)     Problem: Respiratory Compromise (Pneumonia)  Goal: Effective Oxygenation and Ventilation  Outcome: Ongoing (see interventions/notes)     Problem: Skin Injury Risk Increased  Goal: Skin Health and Integrity  Outcome: Ongoing (see interventions/notes)     Problem: Alcohol Withdrawal  Goal: Alcohol Withdrawal Symptom Control  Outcome: Ongoing (see interventions/notes)     Problem: Acute Neurologic Deterioration (Alcohol Withdrawal)  Goal: Optimal Neurologic Function  Outcome: Ongoing (see interventions/notes)     Problem: Substance Misuse (Alcohol Withdrawal)  Goal: Readiness for Change Identified  Outcome: Ongoing (see interventions/notes)

## 2021-05-12 NOTE — Care Plan (Signed)
PATIENT REMAINS ON MECHANICAL VENTILATION WITH 10PEEP/60%; UNASYN FOR STREP PNEUMONIA; VAP CAUTI CLABSI PREVENTION; AGITATION MANAGEMENT WITH PRECEDEX FENTANYL ADD ZYPREXA; OPTIMIZE NUTRITION; DIURESIS WITH ALBUMIN/LASIX  Problem: Adult Inpatient Plan of Care  Goal: Plan of Care Review  Outcome: Ongoing (see interventions/notes)  Flowsheets (Taken 05/12/2021 2000)  Plan of Care Reviewed With: patient  Goal: Patient-Specific Goal (Individualized)  Outcome: Ongoing (see interventions/notes)  Flowsheets (Taken 05/12/2021 2000)  Individualized Care Needs: NEEDS HCS  Anxieties, Fears or Concerns: PROGNOSIS  Patient-Specific Goals (Include Timeframe): RASS-1-2  Goal: Absence of Hospital-Acquired Illness or Injury  Outcome: Ongoing (see interventions/notes)  Intervention: Identify and Manage Fall Risk  Recent Flowsheet Documentation  Taken 05/12/2021 2200 by Laurian Brim, RN  Safety Promotion/Fall Prevention: safety round/check completed  Taken 05/12/2021 2100 by Laurian Brim, RN  Safety Promotion/Fall Prevention: safety round/check completed  Taken 05/12/2021 2000 by Laurian Brim, RN  Safety Promotion/Fall Prevention: safety round/check completed  Intervention: Prevent Skin Injury  Recent Flowsheet Documentation  Taken 05/12/2021 2000 by Laurian Brim, RN  Body Position: turned q 2 hours  Intervention: Prevent and Manage VTE (Venous Thromboembolism) Risk  Recent Flowsheet Documentation  Taken 05/12/2021 2000 by Laurian Brim, RN  VTE Prevention/Management:   anticoagulant therapy maintained   sequential compression devices on  Intervention: Prevent Infection  Recent Flowsheet Documentation  Taken 05/12/2021 2000 by Laurian Brim, RN  Infection Prevention:   environmental surveillance performed   equipment surfaces disinfected   glycemic control managed   personal protective equipment utilized   promote handwashing   rest/sleep promoted   visitors restricted/screened  Goal: Optimal Comfort and Wellbeing  Outcome:  Ongoing (see interventions/notes)  Intervention: Provide Person-Centered Care  Recent Flowsheet Documentation  Taken 05/12/2021 2000 by Laurian Brim, Zena Relationship/Rapport:   care explained   emotional support provided   reassurance provided  Goal: Rounds/Family Conference  Outcome: Ongoing (see interventions/notes)     Problem: Fall Injury Risk  Goal: Absence of Fall and Fall-Related Injury  Outcome: Ongoing (see interventions/notes)  Intervention: Promote Injury-Free Environment  Recent Flowsheet Documentation  Taken 05/12/2021 2200 by Laurian Brim, RN  Safety Promotion/Fall Prevention: safety round/check completed  Taken 05/12/2021 2100 by Laurian Brim, RN  Safety Promotion/Fall Prevention: safety round/check completed  Taken 05/12/2021 2000 by Laurian Brim, RN  Safety Promotion/Fall Prevention: safety round/check completed     Problem: Pain Acute  Goal: Acceptable Pain Control and Functional Ability  Outcome: Ongoing (see interventions/notes)  Intervention: Prevent or Manage Pain  Recent Flowsheet Documentation  Taken 05/12/2021 2000 by Laurian Brim, RN  Sensory Stimulation Regulation:   lighting decreased   care clustered   quiet environment promoted  Intervention: Optimize Psychosocial Wellbeing  Recent Flowsheet Documentation  Taken 05/12/2021 2000 by Laurian Brim, RN  Supportive Measures: relaxation techniques promoted     Problem: Communication Impairment (Mechanical Ventilation, Invasive)  Goal: Effective Communication  Outcome: Ongoing (see interventions/notes)     Problem: Device-Related Complication Risk (Mechanical Ventilation, Invasive)  Goal: Optimal Device Function  Outcome: Ongoing (see interventions/notes)  Intervention: Optimize Device Care and Function  Recent Flowsheet Documentation  Taken 05/12/2021 2000 by Laurian Brim, RN  Airway Safety Measures:   manual resuscitator/mask/valve in room   suction at bedside  Aspiration Precautions:   upright posture maintained   tube  feeding placement verified     Problem: Inability to Wean (Mechanical Ventilation, Invasive)  Goal: Mechanical Ventilation Liberation  Outcome: Ongoing (see interventions/notes)     Problem: Nutrition Impairment (Mechanical Ventilation, Invasive)  Goal: Optimal  Nutrition Delivery  Outcome: Ongoing (see interventions/notes)     Problem: Skin and Tissue Injury (Mechanical Ventilation, Invasive)  Goal: Absence of Device-Related Skin and Tissue Injury  Outcome: Ongoing (see interventions/notes)     Problem: Ventilator-Induced Lung Injury (Mechanical Ventilation, Invasive)  Goal: Absence of Ventilator-Induced Lung Injury  Outcome: Ongoing (see interventions/notes)  Intervention: Prevent Ventilator-Associated Pneumonia  Recent Flowsheet Documentation  Taken 05/12/2021 2000 by Laurian Brim, RN  VAP Prevention Bundle:   HOB elevation maintained   oral care with chlorhexidine   VTE prophylaxis provided   vent circuit breaks minimized   stress ulcer prophylaxis provided  Oral Care: oral care with Chlorhexidine provided  Head of Bed (HOB) Positioning: HOB at 30-45 degrees  VAP Prevention Measures: completed     Problem: Non-violent/Non-Self Destructive Restraints  Goal: Alternative methods tried prior to restraints  Outcome: Ongoing (see interventions/notes)  Goal: Patient free from injury and discomfort  Outcome: Ongoing (see interventions/notes)  Goal: Autonomy maintained at the highest possible level  Outcome: Ongoing (see interventions/notes)  Goal: Need for restraints reassessed per policy  Outcome: Ongoing (see interventions/notes)  Goal: Patient education provided  Outcome: Ongoing (see interventions/notes)  Goal: Problem Interventions  Outcome: Ongoing (see interventions/notes)     Problem: Bleeding (Orthopaedic Fracture)  Goal: Absence of Bleeding  Outcome: Ongoing (see interventions/notes)     Problem: Embolism (Orthopaedic Fracture)  Goal: Absence of Embolism Signs and Symptoms  Outcome: Ongoing (see  interventions/notes)  Intervention: Prevent or Manage Embolism Risk  Recent Flowsheet Documentation  Taken 05/12/2021 2000 by Laurian Brim, RN  VTE Prevention/Management:   anticoagulant therapy maintained   sequential compression devices on     Problem: Fracture Stabilization and Management (Orthopaedic Fracture)  Goal: Fracture Stability  Outcome: Ongoing (see interventions/notes)     Problem: Functional Ability Impaired (Orthopaedic Fracture)  Goal: Optimal Functional Ability  Outcome: Ongoing (see interventions/notes)  Intervention: Optimize Functional Ability  Recent Flowsheet Documentation  Taken 05/12/2021 2000 by Laurian Brim, RN  Activity Management: bedrest  Positioning/Transfer Devices: pillows  Range of Motion: ROM (range of motion) performed     Problem: Infection (Orthopaedic Fracture)  Goal: Absence of Infection Signs and Symptoms  Outcome: Ongoing (see interventions/notes)  Intervention: Prevent or Manage Infection  Recent Flowsheet Documentation  Taken 05/12/2021 2000 by Laurian Brim, RN  Fever Reduction/Comfort Measures: room temperature adjusted  Infection Prevention:   environmental surveillance performed   equipment surfaces disinfected   glycemic control managed   personal protective equipment utilized   promote handwashing   rest/sleep promoted   visitors restricted/screened     Problem: Neurovascular Compromise (Orthopaedic Fracture)  Goal: Effective Tissue Perfusion  Outcome: Ongoing (see interventions/notes)     Problem: Pain (Orthopaedic Fracture)  Goal: Acceptable Pain Control  Outcome: Ongoing (see interventions/notes)     Problem: Respiratory Compromise (Orthopaedic Fracture)  Goal: Effective Oxygenation and Ventilation  Outcome: Ongoing (see interventions/notes)  Intervention: Promote Airway Secretion Clearance  Recent Flowsheet Documentation  Taken 05/12/2021 2000 by Laurian Brim, RN  Cough And Deep Breathing: unable to perform     Problem: ARDS (Acute Respiratory Distress  Syndrome)  Goal: Effective Oxygenation  Outcome: Ongoing (see interventions/notes)     Problem: Adjustment to Injury (Multiple Trauma)  Goal: Optimal Coping with Effects of Injury  Outcome: Ongoing (see interventions/notes)  Intervention: Support Adjustment to Injury  Recent Flowsheet Documentation  Taken 05/12/2021 2000 by Laurian Brim, RN  Supportive Measures: relaxation techniques promoted     Problem: Bleeding (Multiple Trauma)  Goal: Absence of Bleeding  Outcome: Ongoing (see interventions/notes)     Problem: Cerebral Tissue Perfusion Risk (Multiple Trauma)  Goal: Effective Cerebral Tissue Perfusion  Outcome: Ongoing (see interventions/notes)  Intervention: Protect and Optimize Cerebral Perfusion  Recent Flowsheet Documentation  Taken 05/12/2021 2000 by Laurian Brim, RN  Sensory Stimulation Regulation:   lighting decreased   care clustered   quiet environment promoted  Seizure Precautions:   activity supervised   clutter-free environment maintained   emergency equipment at bedside  Cerebral Perfusion Promotion: blood pressure monitored     Problem: Fluid Imbalance (Multiple Trauma)  Goal: Fluid Balance  Outcome: Ongoing (see interventions/notes)     Problem: Functional Ability Impaired (Multiple Trauma)  Goal: Optimal Functional Ability  Outcome: Ongoing (see interventions/notes)  Intervention: Optimize Functional Ability  Recent Flowsheet Documentation  Taken 05/12/2021 2000 by Laurian Brim, RN  Activity Management: bedrest  Positioning/Transfer Devices: pillows  Range of Motion: ROM (range of motion) performed     Problem: Infection (Multiple Trauma)  Goal: Absence of Infection Signs and Symptoms  Outcome: Ongoing (see interventions/notes)  Intervention: Prevent or Manage Infection  Recent Flowsheet Documentation  Taken 05/12/2021 2000 by Laurian Brim, RN  Fever Reduction/Comfort Measures: room temperature adjusted  Infection Prevention:   environmental surveillance performed   equipment surfaces  disinfected   glycemic control managed   personal protective equipment utilized   promote handwashing   rest/sleep promoted   visitors restricted/screened     Problem: Neurovascular Compromise (Multiple Trauma)  Goal: Effective Peripheral Tissue Perfusion  Outcome: Ongoing (see interventions/notes)     Problem: Pain (Multiple Trauma)  Goal: Acceptable Pain Control  Outcome: Ongoing (see interventions/notes)     Problem: Respiratory Compromise (Multiple Trauma)  Goal: Effective Oxygenation and Ventilation  Outcome: Ongoing (see interventions/notes)  Intervention: Promote Airway Secretion Clearance  Recent Flowsheet Documentation  Taken 05/12/2021 2000 by Laurian Brim, RN  Cough And Deep Breathing: unable to perform  Activity Management: bedrest  Intervention: Optimize Oxygenation and Ventilation  Recent Flowsheet Documentation  Taken 05/12/2021 2000 by Laurian Brim, RN  Head of Bed [facility]) Positioning: HOB at 30-45 degrees     Problem: Fluid Imbalance (Pneumonia)  Goal: Fluid Balance  Outcome: Ongoing (see interventions/notes)     Problem: Infection (Pneumonia)  Goal: Resolution of Infection Signs and Symptoms  Outcome: Ongoing (see interventions/notes)  Intervention: Prevent Infection Progression  Recent Flowsheet Documentation  Taken 05/12/2021 2000 by Laurian Brim, RN  Fever Reduction/Comfort Measures: room temperature adjusted     Problem: Respiratory Compromise (Pneumonia)  Goal: Effective Oxygenation and Ventilation  Outcome: Ongoing (see interventions/notes)  Intervention: Promote Airway Secretion Clearance  Recent Flowsheet Documentation  Taken 05/12/2021 2000 by Laurian Brim, RN  Cough And Deep Breathing: unable to perform  Intervention: Optimize Oxygenation and Ventilation  Recent Flowsheet Documentation  Taken 05/12/2021 2000 by Laurian Brim, RN  Head of Bed [facility]) Positioning: HOB at 30-45 degrees     Problem: Skin Injury Risk Increased  Goal: Skin Health and Integrity  Outcome: Ongoing (see  interventions/notes)  Intervention: Optimize Skin Protection  Recent Flowsheet Documentation  Taken 05/12/2021 2000 by Laurian Brim, RN  Pressure Reduction Techniques: (L,M,H,VH)Frequent Turning  Pressure Reduction Devices: heels elevated off bed  Skin Protection:   adhesive use limited   transparent dressing maintained   tubing/devices free from skin contact  Head of Bed (HOB) Positioning: HOB at 30-45 degrees     Problem: Alcohol Withdrawal  Goal: Alcohol Withdrawal Symptom Control  Outcome: Ongoing (see interventions/notes)  Intervention: Minimize or Manage Alcohol Withdrawal Symptoms  Recent Flowsheet Documentation  Taken 05/12/2021 2000 by Laurian Brim, RN  Sensory Stimulation Regulation:   lighting decreased   care clustered   quiet environment promoted  Aspiration Precautions:   upright posture maintained   tube feeding placement verified  Seizure Precautions:   activity supervised   clutter-free environment maintained   emergency equipment at bedside     Problem: Acute Neurologic Deterioration (Alcohol Withdrawal)  Goal: Optimal Neurologic Function  Outcome: Ongoing (see interventions/notes)  Intervention: Minimize or Manage Acute Neurologic Symptoms  Recent Flowsheet Documentation  Taken 05/12/2021 2000 by Laurian Brim, RN  Sensory Stimulation Regulation:   lighting decreased   care clustered   quiet environment promoted  Cerebral Perfusion Promotion: blood pressure monitored  Intervention: Prevent Seizure-Related Injury  Recent Flowsheet Documentation  Taken 05/12/2021 2000 by Laurian Brim, RN  Seizure Precautions:   activity supervised   clutter-free environment maintained   emergency equipment at bedside     Problem: Substance Misuse (Alcohol Withdrawal)  Goal: Readiness for Change Identified  Outcome: Ongoing (see interventions/notes)  Intervention: Partner to [place] Documentation  Taken 05/12/2021 2000 by Laurian Brim, RN  Supportive Measures: relaxation  techniques promoted

## 2021-05-12 NOTE — Progress Notes (Signed)
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        Impression    1.Bibasilar atelectasis or pneumonia/aspiration  2.No pneumothorax  3.Endotracheal tube terminating 5 cm above the carina      Radiologist location ID: [id number]     XR AP MOBILE CHEST     Status: None    Narrative    Dewain LEE Ticer    RADIOLOGIST: Berton Bon, MD    XR AP MOBILE CHEST performed on 05/06/2021 6:32 AM    CLINICAL HISTORY: Trauma.  trauma/vent/resp failure    TECHNIQUE: Frontal view of the chest.    COMPARISON:  Yesterday    FINDINGS:  ET tube 3 cm above the carina. There is an NG tube in the stomach. There is a left PICC catheter in the SVC.   Heart size is moderately enlarged.     There are bilateral perihilar infiltrates   Mild right dorsal scoliosis        Impression    Persistent perihilar infiltrates/vascular congestion slightly worse on the left since yesterday      Radiologist location ID: [id number]     XR AP MOBILE CHEST     Status: None    Narrative    Roston LEE Smalls    RADIOLOGIST: Dorisann Frames,  MD    XR AP MOBILE CHEST performed on 05/07/2021 6:11 AM    CLINICAL HISTORY: Trauma.  on vent     TECHNIQUE: Frontal view of the chest.    COMPARISON:  Yesterday    FINDINGS:  The support lines and tubes are in stable and satisfactory position.   Cardiac and mediastinal contours are stable.   No significant change in the  appearance of the lungs.   Extensive bilateral pulmonary infiltrates persist with a moderate size left pleural effusion.        Impression    NO SIGNIFICANT CHANGE SINCE THE PRIOR EXAM.      Radiologist location ID: [id number]     [place] CHEST (EFFUSION)     Status: None    Narrative    Tyjay LEE Schendel    RADIOLOGIST: Colletta Maryland, MD    [place] CHEST (EFFUSION) performed on 05/07/2021 11:17 AM    CLINICAL HISTORY: pleural effusions..  check left pleural effusion    TECHNIQUE:  Ultrasound imaging of left chest.    COMPARISON:  None.    FINDINGS:  There is a moderate-sized left pleural effusion.        Impression    LEFT PLEURAL EFFUSION        Radiologist location ID: [id number]     US THORACENTESIS     Status: None    Narrative    *Procedure not read by radiology.    *Please Refer to Procedure Note for result.   XR AP MOBILE CHEST     Status: None    Narrative    Robertson LEE Heckert    RADIOLOGIST: Berton Bon, MD    XR AP MOBILE CHEST performed on 05/07/2021 4:40 PM    CLINICAL HISTORY: POST THORACENTESIS.  s/p left thoracentisis.     TECHNIQUE: Frontal view of the chest.    COMPARISON:  Previous exam from today    FINDINGS:  ET tube 5 cm above the carina. There is an NG tube in the stomach. There is a left PICC catheter in the SVC   Heart size is mildly enlarged.     There are perihilar and basilar infiltrates. Possible small effusions obscuring the diaphragms   There are multiple left lateral rib fracture deformities        Impression    Negative for pneumothorax after thoracentesis      Radiologist location ID: [id number]     XR AP MOBILE CHEST     Status: None    Narrative    Leamon LEE Kruszka    RADIOLOGIST: Otho Najjar, MD    XR AP MOBILE CHEST performed on 05/09/2021 6:15 AM    CLINICAL HISTORY: Mult. L rib fx.s, effusion, intubated.  resp fail-vent, effusion, multiple rib fx's    TECHNIQUE: Frontal view of the chest.    COMPARISON:  05/07/2021    FINDINGS:  The support lines and tubes are in stable and satisfactory  position. No pneumothorax.     There is increasing opacity bilaterally in the mid lung fields with infiltrate, effusion and vascular congestion. This could be inflammatory but the symmetry suggests development of pulmonary edema. Clinical correlation recommended.      Impression    Increasing opacity bilaterally suggesting increasing CHF. Clinical correlation recommended.      Radiologist location ID: [id number]     XR AP MOBILE CHEST     Status: None    Narrative    Raahim LEE Tassin    RADIOLOGIST:  Colletta Maryland, MD    XR AP MOBILE CHEST performed on 05/10/2021 6:19 AM    CLINICAL HISTORY: respiratory failure.  resp fail-vent    TECHNIQUE: Frontal view of the chest.    COMPARISON:  05/09/2021    FINDINGS:  The support lines and tubes are in stable and satisfactory position.   Cardiac and mediastinal contours are stable.   Airspace disease is present in the mid to lower lungs with probable bilateral pleural effusions. Lung volumes are slightly improved since yesterday           Impression    BILATERAL AIRSPACE DISEASE AND PLEURAL EFFUSIONS WITH SOME IMPROVEMENT SINCE YESTERDAY      Radiologist location ID: [id number]     CT ANGIO CHEST FOR PULMONARY EMBOLUS W IV CONTRAST     Status: None    Narrative    Gerhart LEE Linhares    RADIOLOGIST: Colletta Maryland, MD    CT ANGIO CHEST FOR PULMONARY EMBOLUS W IV CONTRAST performed on 05/10/2021 11:49 AM    CLINICAL HISTORY: hypoxia.  on the Vent , increased hypoxia. CXR showed increased opacities  Increase in CHF  Lung sounds coarse  . fall on 05-01-21    TECHNIQUE: CTA imaging of the chest with intravenous contrast.  3D reconstructions.  CONTRAST:  100 ml's of Isovue 370    COMPARISON: 05/03/2021  # of known CTs in the past 12 months: 7   # of known Cardiac Nuclear Medicine Studies in the past 12 months: 0         FINDINGS:  Hardware:  There is an NG tube in the stomach. An ET tube is present in the trachea. There is a left-sided PICC line in the SVC.    Lymph nodes:   No mediastinal,  hilar, or axillary lymphadenopathy.    Heart:  Coronary artery calcifications are noted.        RV/LV Diameter Ratio: N/A    Thoracic Aorta:  No thoracic aortic aneurysm or dissection.    Pulmonary Vessels:  No evidence of acute pulmonary emboli through the major subsegmental branches.    Lungs and Airways:  There is patient respiratory motion artifact which limits evaluation of the lungs.      Bilateral lower lobe airspace disease with air bronchograms. Pneumonia versus atelectasis.      Pleura: Bilateral pleural effusions left larger than right    Upper Abdomen: Cirrhosis with splenomegaly. Small volume of ascites. Probable cholelithiasis.    Bones: Bone windows are unremarkable.        Impression    1.NO PULMONARY EMBOLUS  2.BILATERAL PLEURAL EFFUSIONS  3.BILATERAL LOWER LOBE AIRSPACE DISEASE. PNEUMONIA VERSUS ATELECTASIS      One or more dose reduction techniques were used (e.g., Automated exposure control, adjustment of the mA and/or kV according to patient size, use of iterative reconstruction technique).      Radiologist location ID: [id number]     XR AP MOBILE CHEST     Status: None    Narrative    Ash LEE Barz    RADIOLOGIST: Colletta Maryland, MD    XR AP MOBILE CHEST performed on 05/11/2021 5:35 AM    CLINICAL HISTORY: respiratory failure.  resp fail-vent    TECHNIQUE: Frontal view of the chest.    COMPARISON:  Yesterday    FINDINGS:  The support lines and tubes are in stable and satisfactory position.   Cardiac and mediastinal contours are stable.   Bilateral airspace disease with bilateral  pleural effusions similar to yesterday           Impression    NO ACUTE FINDINGS.      Radiologist location ID: [id number]     CT BRAIN WO IV CONTRAST     Status: None    Narrative    Boleslaw LEE Vanwyk    RADIOLOGIST: Colletta Maryland, MD    CT BRAIN WO IV CONTRAST performed on 05/12/2021 2:27 AM    CLINICAL HISTORY: altered mental status.  Pt intubated and per RN very restless this early AM    TECHNIQUE:  Head CT without  intravenous contrast.    COMPARISON: 05/01/2021  # of known CTs in the past 12 months: 8   # of known Cardiac Nuclear Medicine Studies in the past 12 months: 0    FINDINGS: This study is degraded by patient motion artifact  There is no acute intracranial hemorrhage, mass effect, or evidence of large acute infarct.    Brain: Low density in the periventricular white matter suggests mild chronic small vessel ischemic changes.    CSF Spaces: Mild generalized cerebral atrophy     Sinuses/Mastoids:  Clear at visualized levels     Bones: Unremarkable        Impression    CHRONIC CHANGES.  NO ACUTE FINDINGS.       One or more dose reduction techniques were used (e.g., Automated exposure control, adjustment of the mA and/or kV according to patient size, use of iterative reconstruction technique).      Radiologist location ID: [id number]     XR AP MOBILE CHEST     Status: None    Narrative    Devean LEE Stead    RADIOLOGIST: Colletta Maryland, MD    XR AP MOBILE CHEST performed on 05/12/2021 6:20 AM    CLINICAL HISTORY: respiratory failure.  vent management     TECHNIQUE: Frontal view of the chest.    COMPARISON:  Yesterday    FINDINGS:  The support lines and tubes are in stable and satisfactory position.   Cardiac and mediastinal contours are stable.   There is a left pleural effusion and bilateral airspace disease with possible right pleural effusion. Mild improvement in the right lung since yesterday.           Impression    BILATERAL AIRSPACE DISEASE AND PLEURAL EFFUSIONS LEFT WORSE      Radiologist location ID: [id number]        All current radiology studies were reviewed.    PHYSICAL EXAMINATION  Temperature: 38 C (100.4 F)  Heart Rate: 75  BP (Non-Invasive): (!) 159/63  Respiratory Rate: (!) 22  SpO2: 95 %    General: 60 yo male sedated and maintained on MV   HEENT: Normocephalic.  Oral endotracheal tube and orogastric tube in place  Neck: Trach midline, supple   Heart: Sinus rhythm, HR 70's. 2+ pitting edema. Pulses  2+. Brisk capillary refill   Lungs: Maintained on mechanical ventilation. Breath sounds with rhonchi throughout, coarse. Small thick, white secretions from ETT currently  Abdomen: Soft, round, non-tender on palpation, BS normoactive, OGT with tube feedings infusing/ tolerating. Reported BM 5/27, soft/ brown  Renal: Foley catheter draining amber urine to gravity   Extremities: 1+ edema noted to b/l hands, 2+ pitting edema to BLE  Skin: Warm and dry  Neuro: Sedated for agitation. Does not follow commands or make eye contact. Has visual threat and protectives intact. No response to noxious stimuli but moves extremities  spontaneously   Psych: Sedated     VENTILATOR/SETTINGS:       OET= Size 7.5 mm; taped at 26 cm       Mode:  Assist control       Tidal Volume:  500       Ventilator Rate:  22       FI02:  60%       PEEP:  10 cm       Peak air pressure is 26 cm and mean airway pressure 12       PROBLEM LIST:   Active Hospital Problems   (*Primary Problem)    Diagnosis   . *Spleen injury   . Pleural effusion   . On mechanically assisted ventilation (CMS HCC)   . Multiple rib fractures   . Left pulmonary contusion   . Closed fracture of transverse process of thoracic vertebra (CMS HCC)   . 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 Thrombocytopenia (CMS HCC)   . Alcohol abuse      NEURO:  - Hx alcoholism  - S/p trauma with fall down 11 stairs while intoxicated   - Sedated with propofol and no s/s withdrawal. On multivitamin and thiamine.   - CTH: No acute intracranial findings. Left posterior occipital scalp hematoma  - CT C-spine and lumbar spine negative   - CT thoracic spine: Multiple rib fractures (medial left 4th- 6th and sixth ribs and posterior lateral left 4th-7th . Possible non-displaced left transverse process fractures of T5 and T6   - Analgesic: Fentanyl infusion, Lidoderm patch   - Seroquel and Serax discontinued 2/68  - Toxic metabolic encephalopathy -> continues with agitation and altered mental  status  - Repeat [facility] 5/29 with no acute findings  - Daily sedation vacation for neuro assessments; currently not tolerating    RESP:  - Acute respiratory failure with hypoxia ->transferred to ICU on 5/19 for intubation   - CTA chest: small left pneumothorax <5% resolved on follow up the following day. Rt>Lt. Trace left pleural effusion. Multiple displaced and comminuted left rib fractures. SubQ air left chest wall extending into the left flank  - CT with possible small bilat LL collapse/plugging - 05/05/21 bronch with multiple small casts from BLL - repeat CXR with no change. No cultures obtained at that time.  - Worsening hypoxia with pO2 60's, peep increased to 10 on 5/27 -> Covid swab and CTA r/o PE -> both negative. CTA with persistent b/l effusions, may require thoracentesis in the future  - DuoNebs every 4 hours as needed  - Strep pneumo PNA  -> Unasyn continued and D#9  - Serial CXRs and ABGs  - SBT 5/28 with NIF -33, RSBI 44 and rapid desaturation    CVS:  - Hx HTN  - Hypertensive ->resolved while sedated  - PRN Lopressor  - Routine hemodynamic monitoring and continuous telemetry    GI:  - CTA abdomen/ pelvis: Grade 3 splenic injury. Cirrhosis with evidence of portal hypertension  - Repeat CT A/Pon5/20: Stable splenic laceration  - Continue daily H/H's if he bleeds he will require embolization  - NPO  -Tube feeding, tolerating at goal   -Prophylaxis: IV Pepcid  - Bowel regimen: Colace and Senna    Renal:  - Received 80 mg Lasix x 1 on 5/27 with total 3L UO in 24 hrs.  - Lasix 20 mg Q8H with good diuresis. Remains positive LOS   - Foley catheter for strict I/O's  -  Monitor and correct electrolyte imbalance as needed  - Serial BMPs    HEME:  - No active bleeding noted; H/H stable  - Thrombocytopeniacontinue to trend, plt 90  - SQ Lovenox andSCD's for prophylaxis  - Serial CBC differential    ID:  - WBC4.5  - Pneumonia -> Unasyn D#9 (Unasyn dose increased 5/21)  - Sputum culture growing  Haemophilis and Kelbsiella as of 5/23 and and now with strep pneumoniae  - Urine strep pneumo and legionellanegative  - Procalcitonin0.10  -Blood cultures negative  - UAwith no signs of infective source  - Vaccinations ordered, not given yet  - Tmax 101 -> re-cultured 5/28    ENDO:  - TSH4.13and HA1C5.3  - POCT glucometer Q 6 hours, add SSI as needed for optimal glycemic controland will continue adjust as necessary to achieve optimal BS levels    Prophylaxis:  -Lovenox, SCDs, Pepcid, Peridex,HOB > 30, pulmonary hygiene    Code Status:  Full Code     Critical Care Time: 42 minutes    I have personally examined the patient at the bedside; reviewed the medical chart, laboratory data, radiologic imaging and discussed patient's case at bedside with multidisciplinary critical care team. Critical Care excludes any bedside procedures or teaching. Dr. Rosanna Randy available for collaboration at all times.    Letta Median, APRN

## 2021-05-13 ENCOUNTER — Inpatient Hospital Stay (HOSPITAL_COMMUNITY): Payer: MEDICAID

## 2021-05-13 DIAGNOSIS — R451 Restlessness and agitation: Secondary | ICD-10-CM

## 2021-05-13 DIAGNOSIS — G928 Other toxic encephalopathy: Secondary | ICD-10-CM

## 2021-05-13 DIAGNOSIS — G9341 Metabolic encephalopathy: Secondary | ICD-10-CM

## 2021-05-13 LAB — BASIC METABOLIC PANEL
ANION GAP: 8 mmol/L (ref 5–19)
BUN/CREA RATIO: 56 — ABNORMAL HIGH (ref 6–20)
BUN: 27 mg/dL — ABNORMAL HIGH (ref 9–20)
CALCIUM: 8.7 mg/dL (ref 8.4–10.2)
CHLORIDE: 110 mmol/L — ABNORMAL HIGH (ref 98–107)
CO2 TOTAL: 30 mmol/L (ref 22–30)
CREATININE: 0.48 mg/dL — ABNORMAL LOW (ref 0.66–1.20)
ESTIMATED GFR: >60 mL/min/{1.73_m2} (ref >60)
GLUCOSE: 126 mg/dL — ABNORMAL HIGH (ref 74–106)
POTASSIUM: 3.4 mmol/L — ABNORMAL LOW (ref 3.5–5.1)
SODIUM: 148 mmol/L — ABNORMAL HIGH (ref 137–145)

## 2021-05-13 LAB — BILIRUBIN, CONJUGATED (DIRECT): BILIRUBIN DIRECT: 0 mg/dL (ref 0.0–0.3)

## 2021-05-13 LAB — ARTERIAL BLOOD GAS, CO-OX, LYTES, LACTATE REFLEX
%FIO2 (ARTERIAL): 60 %
(T) PCO2: 41 mm/Hg (ref 35.0–45.0)
(T) PO2: 89 mm/Hg (ref 72.0–100.0)
BASE EXCESS (ARTERIAL): 4.9 mmol/L — ABNORMAL HIGH (ref 0.0–1.0)
BICARBONATE (ARTERIAL): 28.7 mmol/L — ABNORMAL HIGH (ref 18.0–26.0)
CARBOXYHEMOGLOBIN: 1.7 % (ref 0.0–2.5)
CHLORIDE: 112 mmol/L — ABNORMAL HIGH (ref 101–111)
GLUCOSE: 117 mg/dL — ABNORMAL HIGH (ref 60–105)
HEMATOCRITRT: 34 %
HEMOGLOBIN: 11.3 g/dL — ABNORMAL LOW (ref 12.0–18.0)
IONIZED CALCIUM: 1.19 mmol/L (ref 1.10–1.35)
LACTATE: 0.7 mmol/L (ref 0.0–1.3)
MET-HEMOGLOBIN: 1.1 % (ref 0.0–2.0)
O2CT: 15.2 % — ABNORMAL LOW (ref 15.7–24.3)
OXYHEMOGLOBIN: 95.3 % (ref 85.0–98.0)
PAO2/FIO2 RATIO: 148 (ref <=200)
PCO2 (ARTERIAL): 41 mm/Hg (ref 35–45)
PH (ARTERIAL): 7.46 — ABNORMAL HIGH (ref 7.35–7.45)
PH (T): 7.46 — ABNORMAL HIGH (ref 7.35–7.45)
PO2 (ARTERIAL): 89 mm/Hg (ref 72–100)
SODIUM: 146 mmol/L — ABNORMAL HIGH (ref 137–145)
TEMPERATURE, COMP: 37 C
WHOLE BLOOD POTASSIUM: 3.5 mmol/L (ref 3.5–4.6)

## 2021-05-13 LAB — CBC
HCT: 33.9 % — ABNORMAL LOW (ref 36.0–46.0)
HGB: 11.2 g/dL — ABNORMAL LOW (ref 13.9–16.3)
MCH: 33.7 pg (ref 25.4–34.0)
MCHC: 33 g/dL (ref 30.0–37.0)
MCV: 102.2 fL — ABNORMAL HIGH (ref 80.0–100.0)
MPV: 10.1 fL (ref 7.5–11.5)
PLATELETS: 87 10*3/uL — ABNORMAL LOW (ref 130–400)
RBC: 3.31 10*6/uL — ABNORMAL LOW (ref 4.30–5.90)
RDW: 14.9 % — ABNORMAL HIGH (ref 11.5–14.0)
WBC: 5.5 10*3/uL (ref 4.5–11.5)

## 2021-05-13 LAB — AST (SGOT): AST (SGOT): 63 U/L — ABNORMAL HIGH (ref 17–59)

## 2021-05-13 LAB — POTASSIUM
POTASSIUM: 3.3 mmol/L — ABNORMAL LOW (ref 3.5–5.1)
POTASSIUM: 3.5 mmol/L (ref 3.5–5.1)
POTASSIUM: 3.6 mmol/L (ref 3.5–5.1)
POTASSIUM: 3.6 mmol/L (ref 3.5–5.1)
POTASSIUM: 3.9 mmol/L (ref 3.5–5.1)

## 2021-05-13 LAB — GAMMA GT: GGT: 376 U/L — ABNORMAL HIGH (ref 15–73)

## 2021-05-13 LAB — PT/INR
INR: 1.18 — ABNORMAL HIGH (ref 0.85–1.10)
PROTHROMBIN TIME: 12.4 seconds — ABNORMAL HIGH (ref 9.0–12.0)

## 2021-05-13 LAB — ALT (SGPT): ALT (SGPT): 45 U/L (ref <50)

## 2021-05-13 LAB — BILIRUBIN TOTAL: BILIRUBIN TOTAL: 1.3 mg/dL (ref 0.2–1.3)

## 2021-05-13 LAB — BUN: BUN: 28 mg/dL — ABNORMAL HIGH (ref 9–20)

## 2021-05-13 LAB — PHOSPHORUS: PHOSPHORUS: 4.2 mg/dL (ref 2.5–4.5)

## 2021-05-13 LAB — TOTAL PROTEIN: PROTEIN TOTAL: 6.3 g/dL (ref 6.3–8.2)

## 2021-05-13 LAB — ADULT ROUTINE BLOOD CULTURE, SET OF 2 BOTTLES (BACTERIA AND YEAST): BLOOD CULTURE, ROUTINE: NO GROWTH

## 2021-05-13 LAB — MAGNESIUM: MAGNESIUM: 2.4 mg/dL — ABNORMAL HIGH (ref 1.6–2.3)

## 2021-05-13 LAB — ALK PHOS (ALKALINE PHOSPHATASE): ALKALINE PHOSPHATASE: 161 U/L — ABNORMAL HIGH (ref 38–126)

## 2021-05-13 LAB — AMMONIA: AMMONIA: 66 umol/L — ABNORMAL HIGH (ref 9–30)

## 2021-05-13 MED ORDER — POTASSIUM CHLORIDE ER 20 MEQ TABLET,EXTENDED RELEASE(PART/CRYST)
20.0000 meq | ORAL_TABLET | Freq: Two times a day (BID) | ORAL | Status: DC
Start: 2021-05-13 — End: 2021-05-13
  Administered 2021-05-13: 20 meq via ORAL
  Filled 2021-05-13: qty 1

## 2021-05-13 MED ORDER — SODIUM CHLORIDE 0.9 % INTRAVENOUS SOLUTION
0.0500 mg/kg/h | INTRAVENOUS | Status: DC
Start: 2021-05-13 — End: 2021-05-20
  Administered 2021-05-13: 0.7 mg/kg/h via INTRAVENOUS
  Administered 2021-05-13: 1 mg/kg/h via INTRAVENOUS
  Administered 2021-05-13: 0.05 mg/kg/h via INTRAVENOUS
  Administered 2021-05-13: 0.5 mg/kg/h via INTRAVENOUS
  Administered 2021-05-13: 1 mg/kg/h via INTRAVENOUS
  Administered 2021-05-13: 0.1 mg/kg/h via INTRAVENOUS
  Administered 2021-05-14 – 2021-05-16 (×46): 1 mg/kg/h via INTRAVENOUS
  Administered 2021-05-17: 0.9 mg/kg/h via INTRAVENOUS
  Administered 2021-05-17: 0.1 mg/kg/h via INTRAVENOUS
  Administered 2021-05-17: 1 mg/kg/h via INTRAVENOUS
  Administered 2021-05-17: 0.1 mg/kg/h via INTRAVENOUS
  Administered 2021-05-17: 1 mg/kg/h via INTRAVENOUS
  Administered 2021-05-17: 0.9 mg/kg/h via INTRAVENOUS
  Administered 2021-05-17 (×3): 0.1 mg/kg/h via INTRAVENOUS
  Administered 2021-05-18: 0.8 mg/kg/h via INTRAVENOUS
  Administered 2021-05-18: 0.5 mg/kg/h via INTRAVENOUS
  Administered 2021-05-18 (×3): 1 mg/kg/h via INTRAVENOUS
  Administered 2021-05-18: 0.1 mg/kg/h via INTRAVENOUS
  Administered 2021-05-18 – 2021-05-19 (×6): 1 mg/kg/h via INTRAVENOUS
  Administered 2021-05-19: 0 mg/kg/h via INTRAVENOUS
  Filled 2021-05-13 (×30): qty 10

## 2021-05-13 MED ORDER — POTASSIUM CHLORIDE ER 20 MEQ TABLET,EXTENDED RELEASE(PART/CRYST)
20.0000 meq | ORAL_TABLET | Freq: Three times a day (TID) | ORAL | Status: DC
Start: 2021-05-14 — End: 2021-05-30
  Administered 2021-05-14 – 2021-05-15 (×6): 20 meq via ORAL
  Administered 2021-05-16 (×2): 0 meq via ORAL
  Administered 2021-05-16 – 2021-05-17 (×2): 20 meq via ORAL
  Administered 2021-05-17: 0 meq via ORAL
  Administered 2021-05-17 – 2021-05-18 (×3): 20 meq via ORAL
  Administered 2021-05-18: 0 meq via ORAL
  Administered 2021-05-19 – 2021-05-20 (×4): 20 meq via ORAL
  Administered 2021-05-20: 0 meq via ORAL
  Administered 2021-05-20 – 2021-05-21 (×2): 20 meq via ORAL
  Administered 2021-05-21 (×2): 0 meq via ORAL
  Administered 2021-05-22 – 2021-05-24 (×7): 20 meq via ORAL
  Administered 2021-05-24: 0 meq via ORAL
  Administered 2021-05-24 – 2021-05-29 (×16): 20 meq via ORAL
  Filled 2021-05-13 (×37): qty 1

## 2021-05-13 MED ORDER — ALBUMIN, HUMAN 25 % INTRAVENOUS SOLUTION
25.0000 g | Freq: Three times a day (TID) | INTRAVENOUS | Status: DC
Start: 2021-05-14 — End: 2021-05-13

## 2021-05-13 MED ORDER — WATER FOR INJECTION, STERILE INJECTION SOLUTION
2.1000 mL | Freq: Two times a day (BID) | INTRAMUSCULAR | Status: DC | PRN
Start: 2021-05-13 — End: 2021-05-20

## 2021-05-13 MED ORDER — LACTULOSE 20 GRAM/30 ML ORAL SOLUTION
30.0000 mL | Freq: Four times a day (QID) | ORAL | Status: DC
Start: 2021-05-13 — End: 2021-05-15
  Administered 2021-05-13 – 2021-05-15 (×10): 30 mL via ORAL
  Filled 2021-05-13 (×9): qty 30

## 2021-05-13 MED ORDER — PROPOFOL 10 MG/ML INTRAVENOUS EMULSION
5.0000 ug/kg/min | INTRAVENOUS | Status: DC
Start: 2021-05-13 — End: 2021-05-14
  Administered 2021-05-13: 50 ug/kg/min via INTRAVENOUS
  Administered 2021-05-13: 20 ug/kg/min via INTRAVENOUS
  Administered 2021-05-13: 0 ug/kg/min via INTRAVENOUS
  Administered 2021-05-13: 10 ug/kg/min via INTRAVENOUS
  Administered 2021-05-13: 30 ug/kg/min via INTRAVENOUS

## 2021-05-13 MED ORDER — SODIUM PHOSPHATES 19 GRAM-7 GRAM/118 ML ENEMA
133.0000 mL | ENEMA | Freq: Once | RECTAL | Status: AC | PRN
Start: 2021-05-13 — End: 2021-05-13
  Administered 2021-05-13: 133 mL via RECTAL
  Filled 2021-05-13: qty 133

## 2021-05-13 MED ORDER — PROPOFOL 10 MG/ML INTRAVENOUS EMULSION
INTRAVENOUS | Status: AC
Start: 2021-05-13 — End: 2021-05-14
  Administered 2021-05-13: 40 ug/kg/min via INTRAVENOUS
  Filled 2021-05-13: qty 100

## 2021-05-13 MED ORDER — OLANZAPINE 10 MG INTRAMUSCULAR SOLUTION
5.0000 mg | Freq: Two times a day (BID) | INTRAMUSCULAR | Status: DC | PRN
Start: 2021-05-13 — End: 2021-05-20
  Administered 2021-05-13 – 2021-05-14 (×2): 5 mg via INTRAMUSCULAR
  Filled 2021-05-13 (×2): qty 1

## 2021-05-13 MED ORDER — MIDAZOLAM 1 MG/ML INJECTION WRAPPER
1.0000 mg | Freq: Once | INTRAMUSCULAR | Status: AC
Start: 2021-05-13 — End: 2021-05-13

## 2021-05-13 MED ORDER — MIDAZOLAM 1 MG/ML INJECTION WRAPPER
INTRAMUSCULAR | Status: AC
Start: 2021-05-13 — End: 2021-05-13
  Administered 2021-05-13: 1 mg via INTRAVENOUS
  Filled 2021-05-13: qty 2

## 2021-05-13 MED ORDER — POTASSIUM CHLORIDE ER 20 MEQ TABLET,EXTENDED RELEASE(PART/CRYST)
20.0000 meq | ORAL_TABLET | Freq: Two times a day (BID) | ORAL | Status: AC
Start: 2021-05-13 — End: 2021-05-13
  Administered 2021-05-13: 20 meq via ORAL
  Filled 2021-05-13: qty 1

## 2021-05-13 MED ORDER — ALBUMIN, HUMAN 25 % INTRAVENOUS SOLUTION
25.0000 g | Freq: Three times a day (TID) | INTRAVENOUS | Status: AC
Start: 2021-05-14 — End: 2021-05-14
  Administered 2021-05-14: 0 g via INTRAVENOUS
  Administered 2021-05-14: 25 g via INTRAVENOUS
  Administered 2021-05-14: 0 g via INTRAVENOUS
  Administered 2021-05-14: 25 g via INTRAVENOUS
  Administered 2021-05-14: 0 g via INTRAVENOUS
  Administered 2021-05-14: 25 g via INTRAVENOUS
  Filled 2021-05-13 (×2): qty 100
  Filled 2021-05-13: qty 50

## 2021-05-13 MED ORDER — ALBUMIN, HUMAN 25 % INTRAVENOUS SOLUTION
12.5000 g | Freq: Three times a day (TID) | INTRAVENOUS | Status: DC
Start: 2021-05-13 — End: 2021-05-13
  Administered 2021-05-13 (×2): 0 g via INTRAVENOUS
  Administered 2021-05-13 (×2): 12.5 g via INTRAVENOUS
  Filled 2021-05-13 (×3): qty 50

## 2021-05-13 NOTE — Progress Notes (Signed)
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 study.           Impression    As above      Radiologist location ID: [id number]     XR AP MOBILE CHEST     Status: None    Narrative    Tavish LEE Logiudice    RADIOLOGIST: Ileene Hutchinson    XR AP MOBILE CHEST performed on 05/05/2021 1:14 PM    CLINICAL HISTORY: Post Bronchoscopy.  vent     TECHNIQUE: Frontal view of the chest.    COMPARISON:  Chest radiograph dated 05/05/2018    FINDINGS:  The endotracheal tube terminates 5 cm above the carina. An enteric tube courses into the stomach. A left upper extremity PICC terminates near the superior cavoatrial junction.  The heart size is normal.   Lung volumes are low with hazy bibasilar airspace opacities. There is no pneumothorax.   The bones are unremarkable.        Impression    1.Bibasilar atelectasis or pneumonia/aspiration  2.No pneumothorax  3.Endotracheal tube terminating 5 cm above the carina      Radiologist location ID: [id number]     XR AP MOBILE CHEST     Status: None    Narrative    Stanely LEE Salamone    RADIOLOGIST: Berton Bon, MD    XR AP MOBILE CHEST performed on 05/06/2021 6:32 AM    CLINICAL HISTORY: Trauma.  trauma/vent/resp failure    TECHNIQUE: Frontal view of the chest.  COMPARISON:  Yesterday    FINDINGS:  ET tube 3 cm above the carina. There is an NG tube in the stomach. There is a left PICC catheter in the SVC.   Heart size  is moderately enlarged.     There are bilateral perihilar infiltrates   Mild right dorsal scoliosis        Impression    Persistent perihilar infiltrates/vascular congestion slightly worse on the left since yesterday      Radiologist location ID: [id number]     XR AP MOBILE CHEST     Status: None    Narrative    Rush LEE Beamer    RADIOLOGIST: Dorisann Frames, MD    XR AP MOBILE CHEST performed on 05/07/2021 6:11 AM    CLINICAL HISTORY: Trauma.  on vent     TECHNIQUE: Frontal view of the chest.    COMPARISON:  Yesterday    FINDINGS:  The support lines and tubes are in stable and satisfactory position.   Cardiac and mediastinal contours are stable.   No significant change in the appearance of the lungs.   Extensive bilateral pulmonary infiltrates persist with a moderate size left pleural effusion.        Impression    NO SIGNIFICANT CHANGE SINCE THE PRIOR EXAM.      Radiologist location ID: [id number]     [place] CHEST (EFFUSION)     Status: None    Narrative    Josearmando LEE Tamer    RADIOLOGIST: Colletta Maryland, MD    [place] CHEST (EFFUSION) performed on 05/07/2021 11:17 AM    CLINICAL HISTORY: pleural effusions..  check left pleural effusion    TECHNIQUE:  Ultrasound imaging of left chest.    COMPARISON:  None.    FINDINGS:  There is a moderate-sized left pleural effusion.        Impression    LEFT PLEURAL EFFUSION        Radiologist location ID: [id number]     US THORACENTESIS     Status: None    Narrative    *Procedure not read by radiology.    *Please Refer to Procedure Note for result.   XR AP MOBILE CHEST     Status: None    Narrative    Kule LEE Mood    RADIOLOGIST: Berton Bon, MD    XR AP MOBILE CHEST performed on 05/07/2021 4:40 PM    CLINICAL HISTORY: POST THORACENTESIS.  s/p left thoracentisis.     TECHNIQUE: Frontal view of the chest.    COMPARISON:  Previous exam from today    FINDINGS:  ET tube 5 cm above the carina. There is an NG tube in the stomach. There is a left PICC catheter in the SVC   Heart size is  mildly enlarged.     There are perihilar and basilar infiltrates. Possible small effusions obscuring the diaphragms   There are multiple left lateral rib fracture deformities        Impression    Negative for pneumothorax after thoracentesis      Radiologist location ID: [id number]     XR AP MOBILE CHEST     Status: None    Narrative    Wallice LEE Rookstool    RADIOLOGIST: Otho Najjar, MD    XR AP MOBILE CHEST performed on 05/09/2021 6:15 AM    CLINICAL HISTORY: Mult. L rib fx.s, effusion, intubated.  resp fail-vent, effusion, multiple rib fx's    TECHNIQUE: Frontal view of  the chest.    COMPARISON:  05/07/2021    FINDINGS:  The support lines and tubes are in stable and satisfactory position. No pneumothorax.     There is increasing opacity bilaterally in the mid lung fields with infiltrate, effusion and vascular congestion. This could be inflammatory but the symmetry suggests development of pulmonary edema. Clinical correlation recommended.      Impression    Increasing opacity bilaterally suggesting increasing CHF. Clinical correlation recommended.      Radiologist location ID: [id number]     XR AP MOBILE CHEST     Status: None    Narrative    Amari LEE Pho    RADIOLOGIST: Colletta Maryland, MD    XR AP MOBILE CHEST performed on 05/10/2021 6:19 AM    CLINICAL HISTORY: respiratory failure.  resp fail-vent    TECHNIQUE: Frontal view of the chest.    COMPARISON:  05/09/2021    FINDINGS:  The support lines and tubes are in stable and satisfactory position.   Cardiac and mediastinal contours are stable.   Airspace disease is present in the mid to lower lungs with probable bilateral pleural effusions. Lung volumes are slightly improved since yesterday           Impression    BILATERAL AIRSPACE DISEASE AND PLEURAL EFFUSIONS WITH SOME IMPROVEMENT SINCE YESTERDAY      Radiologist location ID: [id number]     CT ANGIO CHEST FOR PULMONARY EMBOLUS W IV CONTRAST     Status: None    Narrative    Jamear LEE Gingerich    RADIOLOGIST: Colletta Maryland, MD    CT ANGIO CHEST FOR PULMONARY EMBOLUS W IV CONTRAST performed on 05/10/2021 11:49 AM    CLINICAL HISTORY: hypoxia.  on the Vent , increased hypoxia. CXR showed increased opacities  Increase in CHF  Lung sounds coarse  . fall on 05-01-21    TECHNIQUE: CTA imaging of the chest with intravenous contrast.  3D reconstructions.  CONTRAST:  100 ml's of Isovue 370    COMPARISON: 05/03/2021  # of known CTs in the past 12 months: 7   # of known Cardiac Nuclear Medicine Studies in the past 12 months: 0         FINDINGS:  Hardware:  There is an NG tube in the stomach. An ET tube is present in the trachea. There is a left-sided PICC line in the SVC.    Lymph nodes:   No mediastinal, hilar, or axillary lymphadenopathy.    Heart:  Coronary artery calcifications are noted.        RV/LV Diameter Ratio: N/A    Thoracic Aorta:  No thoracic aortic aneurysm or dissection.    Pulmonary Vessels:  No evidence of acute pulmonary emboli through the major subsegmental branches.    Lungs and Airways:  There is patient respiratory motion artifact which limits evaluation of the lungs.      Bilateral lower lobe airspace disease with air bronchograms. Pneumonia versus atelectasis.      Pleura: Bilateral pleural effusions left larger than right    Upper Abdomen: Cirrhosis with splenomegaly. Small volume of ascites. Probable cholelithiasis.    Bones: Bone windows are unremarkable.        Impression    1.NO PULMONARY EMBOLUS  2.BILATERAL PLEURAL EFFUSIONS  3.BILATERAL LOWER LOBE AIRSPACE DISEASE. PNEUMONIA VERSUS ATELECTASIS      One or more dose reduction techniques were used (e.g., Automated exposure control, adjustment of the mA and/or kV according to  patient size, use of iterative reconstruction technique).      Radiologist location ID: [id number]     XR AP MOBILE CHEST     Status: None    Narrative    Dymir LEE Larner    RADIOLOGIST: Colletta Maryland, MD    XR AP MOBILE CHEST performed on 05/11/2021 5:35 AM    CLINICAL HISTORY:  respiratory failure.  resp fail-vent    TECHNIQUE: Frontal view of the chest.    COMPARISON:  Yesterday    FINDINGS:  The support lines and tubes are in stable and satisfactory position.   Cardiac and mediastinal contours are stable.   Bilateral airspace disease with bilateral pleural effusions similar to yesterday           Impression    NO ACUTE FINDINGS.      Radiologist location ID: [id number]     CT BRAIN WO IV CONTRAST     Status: None    Narrative    Jaryn LEE Knobel    RADIOLOGIST: Colletta Maryland, MD    CT BRAIN WO IV CONTRAST performed on 05/12/2021 2:27 AM    CLINICAL HISTORY: altered mental status.  Pt intubated and per RN very restless this early AM    TECHNIQUE:  Head CT without intravenous contrast.    COMPARISON: 05/01/2021  # of known CTs in the past 12 months: 8   # of known Cardiac Nuclear Medicine Studies in the past 12 months: 0    FINDINGS: This study is degraded by patient motion artifact  There is no acute intracranial hemorrhage, mass effect, or evidence of large acute infarct.    Brain: Low density in the periventricular white matter suggests mild chronic small vessel ischemic changes.    CSF Spaces: Mild generalized cerebral atrophy     Sinuses/Mastoids:  Clear at visualized levels     Bones: Unremarkable        Impression    CHRONIC CHANGES.  NO ACUTE FINDINGS.       One or more dose reduction techniques were used (e.g., Automated exposure control, adjustment of the mA and/or kV according to patient size, use of iterative reconstruction technique).      Radiologist location ID: [id number]     XR AP MOBILE CHEST     Status: None    Narrative    Westen LEE Hitchner    RADIOLOGIST: Colletta Maryland, MD    XR AP MOBILE CHEST performed on 05/12/2021 6:20 AM    CLINICAL HISTORY: respiratory failure.  vent management     TECHNIQUE: Frontal view of the chest.    COMPARISON:  Yesterday    FINDINGS:  The support lines and tubes are in stable and satisfactory position.   Cardiac and mediastinal contours are  stable.   There is a left pleural effusion and bilateral airspace disease with possible right pleural effusion. Mild improvement in the right lung since yesterday.           Impression    BILATERAL AIRSPACE DISEASE AND PLEURAL EFFUSIONS LEFT WORSE      Radiologist location ID: [id number]     XR AP MOBILE CHEST     Status: None    Narrative    Anvay LEE Palencia    RADIOLOGIST: Colletta Maryland, MD    XR AP MOBILE CHEST performed on 05/13/2021 8:23 AM    CLINICAL HISTORY: respiratory failure, PNA.  resp.failure, PNA, vent    TECHNIQUE: Frontal view of the chest.  COMPARISON:  Yesterday    FINDINGS:  The support lines and tubes are in stable and satisfactory position.   Cardiac and mediastinal contours are stable.   There is a left pleural effusion unchanged from yesterday. The right lung is improved since yesterday with improved lung volumes.           Impression    PERSISTENT LEFT PLEURAL EFFUSION      Radiologist location ID: [id number]        All current radiology studies were reviewed.    PHYSICAL EXAMINATION  Temperature: 37.4 C (99.3 F)  Heart Rate: 82  BP (Non-Invasive): 134/70  Respiratory Rate: (!) 25  SpO2: 93 %    General: 60 yo male sedated and maintained on MV   HEENT: Normocephalic.  Oral endotracheal tube and orogastric tube in place  Neck: Trach midline, supple   Heart: Sinus rhythm, HR 70's. 2+ pitting edema. Pulses 2+. Brisk capillary refill   Lungs: Maintained on mechanical ventilation. Breath sounds CTA, diminished laterally. Small thick, white secretions from ETT currently  Abdomen: Soft, round, non-tender on palpation, BS normoactive, OGT with tube feedings infusing/ tolerating. Reported BM 5/27, soft/ brown  Renal: Foley catheter draining amber urine to gravity   Extremities: 1+ edema noted to b/l hands, 2+ pitting edema to BLE  Skin: Warm and dry  Neuro: Sedated for agitation. Does not follow commands or make eye contact. Has visual threat and protectives intact. Moves extremities  spontaneously. BLE w/d to noxious stimuli  Psych: Sedated     VENTILATOR/SETTINGS:       OET= Size 7.5 mm; taped at 26 cm       Mode:  Assist control       Tidal Volume:  500       Ventilator Rate:  22       FI02:  60%       PEEP:  10 cm       Peak air pressure is 26 cm and mean airway pressure 12       PROBLEM LIST:   Active Hospital Problems   (*Primary Problem)    Diagnosis   . *Spleen injury   . Pleural effusion   . On mechanically assisted ventilation (CMS HCC)   . Multiple rib fractures   . Left pulmonary contusion   . Closed fracture of transverse process of thoracic vertebra (CMS HCC)   . 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 Thrombocytopenia (CMS HCC)   . Alcohol abuse      NEURO:  - Hx alcoholism  - S/p trauma with fall down 11 stairs while intoxicated   -  On multivitamin and thiamine.   - CTH: No acute intracranial findings. Left posterior occipital scalp hematoma  - CT C-spine and lumbar spine negative   - CT thoracic spine: Multiple rib fractures (medial left 4th- 6th and sixth ribs and posterior lateral left 4th-7th . Possible non-displaced left transverse process fractures of T5 and T6   - Sedated on Fentanyl and Precedex with restlessness/agitation. Started Ketamine infusion 5/30, weaning Fentanyl. Scheduled and PRN Zyprexa dosing  - Seroquel and Serax discontinued 2/03  - Toxic metabolic encephalopathy -> continues with agitation and altered mental status, Ammonia 66 5/30  - Repeat CTH 5/29 with no acute findings  - Daily sedation vacation for neuro assessments; currently not tolerating    RESP:  - Acute respiratory failure with hypoxia ->transferred to ICU on 5/19 for intubation   - CTA chest:  small left pneumothorax <5% resolved on follow up the following day. Rt>Lt. Trace left pleural effusion. Multiple displaced and comminuted left rib fractures. SubQ air left chest wall extending into the left flank  - CT with possible small bilat LL collapse/plugging - 05/05/21 bronch with  multiple small casts from BLL - repeat CXR with no change. No cultures obtained at that time.  - Worsening hypoxia with pO2 60's, peep increased to 10 on 5/27 -> Covid swab and CTA r/o PE -> both negative. CTA with persistent b/l effusions, may require thoracentesis in the future  - DuoNebs every 4 hours as needed  - Strep pneumo PNA  -> Unasyn continued and D#10  - Serial CXRs and ABGs  - SBT 5/28 with NIF -33, RSBI 44 and rapid desaturation  - CXR 5/30 with improved lung volumes    CVS:  - Hx HTN  - Hypertensive ->resolved while sedated  - PRN Lopressor  - Routine hemodynamic monitoring and continuous telemetry    GI:  - CTA abdomen/ pelvis: Grade 3 splenic injury. Cirrhosis with evidence of portal hypertension  - Repeat CT A/Pon5/20: Stable splenic laceration  - Continue daily H/H's if he bleeds he will require embolization  - NPO  -Tube feeding, tolerating at goal   -Prophylaxis: IV Pepcid  - Bowel regimen: Colace and Senna  - Hyperammonemia: 66. Started on Lactulose 41m Q6hrs until bowel movement    Renal:  - Received 80 mg Lasix x 1 on 5/27 with total 3L UO in 24 hrs.  - Changed Lasix dosing to 40 mg Q8hr with Albumin. Remains +3L with good diuresis. UO last 24 hrs approx 8L.  - Foley catheter for strict I/O's  - Monitor and correct electrolyte imbalance as needed  - Serial BMPs    HEME:  - No active bleeding noted; H/H stable  - Thrombocytopeniacontinue to trend, plt 87  - SQ Lovenox andSCD's for prophylaxis  - Serial CBC differential    ID:  - WBC5.5  - Pneumonia -> Unasyn D#10 (Unasyn dose increased 5/21)  - Sputum culture growing Haemophilis and Kelbsiella as of 5/23 and and now with strep pneumoniae. Repeat 5/29 with few GPC and few yeast  - Urine strep pneumo and legionellanegative  - Procalcitonin0.10  -Blood cultures negative  - UAwith no signs of infective source  - Vaccinations ordered, not given yet  - Tmax 101 -> re-cultured 5/28    ENDO:  - TSH4.13and HA1C5.3  - POCT  glucometer Q 6 hours, add SSI as needed for optimal glycemic controland will continue adjust as necessary to achieve optimal BS levels    Prophylaxis:  -Lovenox, SCDs, Pepcid, Peridex,HOB > 30, pulmonary hygiene    Code Status:  Full Code     Critical Care Time: 44 minutes    I have personally examined the patient at the bedside; reviewed the medical chart, laboratory data, radiologic imaging and discussed patient's case at bedside with multidisciplinary critical care team. Critical Care excludes any bedside procedures or teaching. Dr. GRosanna Randyavailable for collaboration at all times.    Drequan Ironside M. Abisola Carrero, APRN, AGACNP-BC

## 2021-05-13 NOTE — Progress Notes (Signed)
Name: Hector [name] MRN:  [id number]   Date of admission: 05/01/2021 Age: 60 y.o.       Name - Hector [name]    Date of service - 05/13/2021      Interval history  No acute events overnight.  Patient remains intubated in the ICU.  Patient has not had any other significant fevers with a T-max of 99.3 overnight.  Patient was found to have an increased ammonia level at 66 and he has been started on lactulose.  He is tolerating tube feeds at goal.  Patient is still requiring a PEEP of 10 on the ventilator settings.  Patient's mentation is still an issue where he becomes agitated but not purposeful.    Physical exam  Filed Vitals:    05/13/21 0646 05/13/21 0700 05/13/21 0800 05/13/21 0900   BP:  (!) 174/88 133/64 134/70   Pulse:  82 64 82   Resp:  17 (!) 26 (!) 25   Temp:  37.4 C (99.3 F) 37.4 C (99.3 F)    SpO2: 95% 96% 96% 93%          Intake/Output Summary (Last 24 hours) at 05/13/2021 0953  Last data filed at 05/13/2021 0900  Gross per 24 hour   Intake 3916.42 ml   Output 6930 ml   Net -3013.58 ml          General:Intubated in the ICU with sedation  Abdomen: soft, nontender, nondistended,small umbilical hernia  Skin: No jaundice, lesions, or rashes.  Lungs:On the ventilator   CV: Hemodynamically stable  Extremities:Patient appears to have fluid overload in all extremities      Labs/Imaging -     XR AP MOBILE CHEST performed on 05/13/2021 8:23 AM    CLINICAL HISTORY: respiratory failure, PNA.  resp.failure, PNA, vent    TECHNIQUE: Frontal view of the chest.    COMPARISON:  Yesterday    FINDINGS:  The support lines and tubes are in stable and satisfactory position.   Cardiac and mediastinal contours are stable.   There is a left pleural effusion unchanged from yesterday. The right lung is improved since yesterday with improved lung volumes.         IMPRESSION:  PERSISTENT LEFT PLEURAL EFFUSION      ASSESSMENT & RECOMMENDATIONS:   60 y.o.malestatus post fall downstairs sustaining multiple  left-sided rib fractures with subcutaneous emphysema,transverse process fracture of thoracic spine,and grade 3 splenic laceration  -weaning the patient from the ventilator is becoming a challenge with increasing FiO2 and PEEP requirements.Marland KitchenMarland KitchenWe will continue with vent weaning  - patient is very restless and anxious on sedation holiday and does not follow commands...  patient was found to have a high ammonia level due to his cirrhosis and he has been started on lactulose  -antibiotics(Unasyn)for the strep pneumo pneumonia...  respiratory culture is growing out some yeast and Gram-positive cocci from 05/11/2021  -continue with Lasix diuresis  -Lovenox for DVT prophylaxis  -continue tube feeding at goal  - appreciate the assistance of the ICU team    Clinton Quant, MD      Note: A portion of this documentation was generated using MMODAL (voice recognition software) and may contain syntax/voice recognition errors.

## 2021-05-13 NOTE — Care Plan (Signed)
Problem: Adult Inpatient Plan of Care  Goal: Plan of Care Review  Outcome: Ongoing (see interventions/notes)  Goal: Patient-Specific Goal (Individualized)  Outcome: Ongoing (see interventions/notes)  Goal: Absence of Hospital-Acquired Illness or Injury  Outcome: Ongoing (see interventions/notes)  Goal: Optimal Comfort and Wellbeing  Outcome: Ongoing (see interventions/notes)  Goal: Rounds/Family Conference  Outcome: Ongoing (see interventions/notes)     Problem: Fall Injury Risk  Goal: Absence of Fall and Fall-Related Injury  Outcome: Ongoing (see interventions/notes)     Problem: Pain Acute  Goal: Acceptable Pain Control and Functional Ability  Outcome: Ongoing (see interventions/notes)     Problem: Communication Impairment (Mechanical Ventilation, Invasive)  Goal: Effective Communication  Outcome: Ongoing (see interventions/notes)     Problem: Device-Related Complication Risk (Mechanical Ventilation, Invasive)  Goal: Optimal Device Function  Outcome: Ongoing (see interventions/notes)     Problem: Inability to Wean (Mechanical Ventilation, Invasive)  Goal: Mechanical Ventilation Liberation  Outcome: Ongoing (see interventions/notes)     Problem: Nutrition Impairment (Mechanical Ventilation, Invasive)  Goal: Optimal Nutrition Delivery  Outcome: Ongoing (see interventions/notes)     Problem: Skin and Tissue Injury (Mechanical Ventilation, Invasive)  Goal: Absence of Device-Related Skin and Tissue Injury  Outcome: Ongoing (see interventions/notes)     Problem: Ventilator-Induced Lung Injury (Mechanical Ventilation, Invasive)  Goal: Absence of Ventilator-Induced Lung Injury  Outcome: Ongoing (see interventions/notes)     Problem: Non-violent/Non-Self Destructive Restraints  Goal: Alternative methods tried prior to restraints  Outcome: Ongoing (see interventions/notes)  Goal: Patient free from injury and discomfort  Outcome: Ongoing (see interventions/notes)  Goal: Autonomy maintained at the highest possible  level  Outcome: Ongoing (see interventions/notes)  Goal: Need for restraints reassessed per policy  Outcome: Ongoing (see interventions/notes)  Goal: Patient education provided  Outcome: Ongoing (see interventions/notes)  Goal: Problem Interventions  Outcome: Ongoing (see interventions/notes)     Problem: Bleeding (Orthopaedic Fracture)  Goal: Absence of Bleeding  Outcome: Ongoing (see interventions/notes)     Problem: Embolism (Orthopaedic Fracture)  Goal: Absence of Embolism Signs and Symptoms  Outcome: Ongoing (see interventions/notes)     Problem: Fracture Stabilization and Management (Orthopaedic Fracture)  Goal: Fracture Stability  Outcome: Ongoing (see interventions/notes)     Problem: Functional Ability Impaired (Orthopaedic Fracture)  Goal: Optimal Functional Ability  Outcome: Ongoing (see interventions/notes)     Problem: Infection (Orthopaedic Fracture)  Goal: Absence of Infection Signs and Symptoms  Outcome: Ongoing (see interventions/notes)     Problem: Neurovascular Compromise (Orthopaedic Fracture)  Goal: Effective Tissue Perfusion  Outcome: Ongoing (see interventions/notes)     Problem: Pain (Orthopaedic Fracture)  Goal: Acceptable Pain Control  Outcome: Ongoing (see interventions/notes)     Problem: Respiratory Compromise (Orthopaedic Fracture)  Goal: Effective Oxygenation and Ventilation  Outcome: Ongoing (see interventions/notes)     Problem: ARDS (Acute Respiratory Distress Syndrome)  Goal: Effective Oxygenation  Outcome: Ongoing (see interventions/notes)     Problem: Adjustment to Injury (Multiple Trauma)  Goal: Optimal Coping with Effects of Injury  Outcome: Ongoing (see interventions/notes)     Problem: Bleeding (Multiple Trauma)  Goal: Absence of Bleeding  Outcome: Ongoing (see interventions/notes)     Problem: Cerebral Tissue Perfusion Risk (Multiple Trauma)  Goal: Effective Cerebral Tissue Perfusion  Outcome: Ongoing (see interventions/notes)     Problem: Fluid Imbalance (Multiple  Trauma)  Goal: Fluid Balance  Outcome: Ongoing (see interventions/notes)     Problem: Functional Ability Impaired (Multiple Trauma)  Goal: Optimal Functional Ability  Outcome: Ongoing (see interventions/notes)     Problem: Infection (Multiple Trauma)  Goal:  Absence of Infection Signs and Symptoms  Outcome: Ongoing (see interventions/notes)     Problem: Neurovascular Compromise (Multiple Trauma)  Goal: Effective Peripheral Tissue Perfusion  Outcome: Ongoing (see interventions/notes)     Problem: Pain (Multiple Trauma)  Goal: Acceptable Pain Control  Outcome: Ongoing (see interventions/notes)     Problem: Respiratory Compromise (Multiple Trauma)  Goal: Effective Oxygenation and Ventilation  Outcome: Ongoing (see interventions/notes)     Problem: Fluid Imbalance (Pneumonia)  Goal: Fluid Balance  Outcome: Ongoing (see interventions/notes)     Problem: Infection (Pneumonia)  Goal: Resolution of Infection Signs and Symptoms  Outcome: Ongoing (see interventions/notes)     Problem: Respiratory Compromise (Pneumonia)  Goal: Effective Oxygenation and Ventilation  Outcome: Ongoing (see interventions/notes)     Problem: Skin Injury Risk Increased  Goal: Skin Health and Integrity  Outcome: Ongoing (see interventions/notes)     Problem: Alcohol Withdrawal  Goal: Alcohol Withdrawal Symptom Control  Outcome: Ongoing (see interventions/notes)     Problem: Acute Neurologic Deterioration (Alcohol Withdrawal)  Goal: Optimal Neurologic Function  Outcome: Ongoing (see interventions/notes)     Problem: Substance Misuse (Alcohol Withdrawal)  Goal: Readiness for Change Identified  Outcome: Ongoing (see interventions/notes)

## 2021-05-13 NOTE — Progress Notes (Addendum)
[facility] is still following patient's progress and are aware he is still on a mechanical ventilator.  [facility] was consulted to complete a Brief Intervention with the patient due to alcohol use.  We will continue to follow and monitor the patient while he is in-hospital. Follow-up 05/16/2021 to see if pt is well enough to meet with.

## 2021-05-14 ENCOUNTER — Inpatient Hospital Stay (HOSPITAL_COMMUNITY): Payer: MEDICAID

## 2021-05-14 DIAGNOSIS — J13 Pneumonia due to Streptococcus pneumoniae: Secondary | ICD-10-CM

## 2021-05-14 DIAGNOSIS — I480 Paroxysmal atrial fibrillation: Secondary | ICD-10-CM

## 2021-05-14 LAB — ARTERIAL BLOOD GAS, CO-OX, LYTES, LACTATE REFLEX
%FIO2 (ARTERIAL): 60 %
(T) PCO2: 41 mm/Hg (ref 35.0–45.0)
(T) PO2: 79 mm/Hg (ref 72.0–100.0)
BASE EXCESS (ARTERIAL): 4.9 mmol/L — ABNORMAL HIGH (ref 0.0–1.0)
BICARBONATE (ARTERIAL): 28.7 mmol/L — ABNORMAL HIGH (ref 18.0–26.0)
CARBOXYHEMOGLOBIN: 1.8 % (ref 0.0–2.5)
CHLORIDE: 115 mmol/L — ABNORMAL HIGH (ref 101–111)
GLUCOSE: 116 mg/dL — ABNORMAL HIGH (ref 60–105)
HEMATOCRITRT: 33 %
HEMOGLOBIN: 11 g/dL — ABNORMAL LOW (ref 12.0–18.0)
IONIZED CALCIUM: 1.17 mmol/L (ref 1.10–1.35)
LACTATE: 0.7 mmol/L (ref 0.0–1.3)
MET-HEMOGLOBIN: 0.8 % (ref 0.0–2.0)
O2CT: 14.7 % — ABNORMAL LOW (ref 15.7–24.3)
OXYHEMOGLOBIN: 94.8 % (ref 85.0–98.0)
PAO2/FIO2 RATIO: 132 (ref <=200)
PCO2 (ARTERIAL): 41 mm/Hg (ref 35–45)
PH (ARTERIAL): 7.46 — ABNORMAL HIGH (ref 7.35–7.45)
PH (T): 7.46 — ABNORMAL HIGH (ref 7.35–7.45)
PO2 (ARTERIAL): 79 mm/Hg (ref 72–100)
SODIUM: 148 mmol/L — ABNORMAL HIGH (ref 137–145)
TEMPERATURE, COMP: 37 C
WHOLE BLOOD POTASSIUM: 3.7 mmol/L (ref 3.5–4.6)

## 2021-05-14 LAB — RESPIRATORY CULTURE AND GRAM STAIN (PERFORMABLE): RESPIRATORY CULTURE: NORMAL

## 2021-05-14 LAB — BASIC METABOLIC PANEL
ANION GAP: 5 mmol/L (ref 5–19)
BUN/CREA RATIO: 58 — ABNORMAL HIGH (ref 6–20)
BUN: 29 mg/dL — ABNORMAL HIGH (ref 9–20)
CALCIUM: 8.7 mg/dL (ref 8.4–10.2)
CHLORIDE: 115 mmol/L — ABNORMAL HIGH (ref 98–107)
CO2 TOTAL: 30 mmol/L (ref 22–30)
CREATININE: 0.5 mg/dL — ABNORMAL LOW (ref 0.66–1.20)
ESTIMATED GFR: >60 mL/min/{1.73_m2} (ref >60)
GLUCOSE: 125 mg/dL — ABNORMAL HIGH (ref 74–106)
POTASSIUM: 3.6 mmol/L (ref 3.5–5.1)
SODIUM: 150 mmol/L — ABNORMAL HIGH (ref 137–145)

## 2021-05-14 LAB — CBC
HCT: 32.2 % — ABNORMAL LOW (ref 36.0–46.0)
HGB: 10.8 g/dL — ABNORMAL LOW (ref 13.9–16.3)
MCH: 34.3 pg — ABNORMAL HIGH (ref 25.4–34.0)
MCHC: 33.5 g/dL (ref 30.0–37.0)
MCV: 102.2 fL — ABNORMAL HIGH (ref 80.0–100.0)
MPV: 11.8 fL — ABNORMAL HIGH (ref 7.5–11.5)
PLATELETS: 85 10*3/uL — ABNORMAL LOW (ref 130–400)
RBC: 3.15 10*6/uL — ABNORMAL LOW (ref 4.30–5.90)
RDW: 15.3 % — ABNORMAL HIGH (ref 11.5–14.0)
WBC: 6.3 10*3/uL (ref 4.5–11.5)

## 2021-05-14 LAB — POTASSIUM
POTASSIUM: 3.5 mmol/L (ref 3.5–5.1)
POTASSIUM: 3.6 mmol/L (ref 3.5–5.1)
POTASSIUM: 3.9 mmol/L (ref 3.5–5.1)
POTASSIUM: 3.9 mmol/L (ref 3.5–5.1)
POTASSIUM: 4.2 mmol/L (ref 3.5–5.1)

## 2021-05-14 LAB — MAGNESIUM
MAGNESIUM: 2.4 mg/dL — ABNORMAL HIGH (ref 1.6–2.3)
MAGNESIUM: 2.4 mg/dL — ABNORMAL HIGH (ref 1.6–2.3)

## 2021-05-14 LAB — ADULT ROUTINE BLOOD CULTURE, SET OF 2 BOTTLES (BACTERIA AND YEAST): BLOOD CULTURE, ROUTINE: NO GROWTH

## 2021-05-14 LAB — AMMONIA: AMMONIA: 42 umol/L — ABNORMAL HIGH (ref 9–30)

## 2021-05-14 LAB — URINE CULTURE,ROUTINE: URINE CULTURE: NO GROWTH

## 2021-05-14 LAB — PHOSPHORUS: PHOSPHORUS: 4.2 mg/dL (ref 2.5–4.5)

## 2021-05-14 MED ORDER — FENTANYL (PF) 50 MCG/ML INJECTION SOLUTION
50.0000 ug | Freq: Once | INTRAMUSCULAR | Status: AC
Start: 2021-05-14 — End: 2021-05-14

## 2021-05-14 MED ORDER — FENTANYL (PF) 50 MCG/ML INJECTION SOLUTION
50.0000 ug | INTRAMUSCULAR | Status: DC | PRN
Start: 2021-05-14 — End: 2021-05-20
  Administered 2021-05-15 – 2021-05-18 (×7): 50 ug via INTRAVENOUS
  Filled 2021-05-14 (×8): qty 2

## 2021-05-14 MED ORDER — DEXTROSE 5 % IN WATER (D5W) INTRAVENOUS SOLUTION
Freq: Once | INTRAVENOUS | Status: AC
Start: 2021-05-14 — End: 2021-05-14

## 2021-05-14 MED ORDER — AMIODARONE 150 MG/100 ML (1.5 MG/ML) IN DEXTROSE, ISO-OSMOTIC IV
150.0000 mg | Freq: Once | INTRAVENOUS | Status: DC
Start: 2021-05-14 — End: 2021-05-16
  Administered 2021-05-14: 0 mg via INTRAVENOUS
  Filled 2021-05-14: qty 100

## 2021-05-14 MED ORDER — FENTANYL (PF) 50 MCG/ML INJECTION SOLUTION
INTRAMUSCULAR | Status: AC
Start: 2021-05-14 — End: 2021-05-14
  Administered 2021-05-14: 50 ug via INTRAVENOUS
  Filled 2021-05-14: qty 2

## 2021-05-14 MED ORDER — PROPOFOL 10 MG/ML INTRAVENOUS EMULSION
INTRAVENOUS | Status: AC
Start: 2021-05-14 — End: 2021-05-15
  Administered 2021-05-14: 0 ug/kg/min
  Filled 2021-05-14: qty 100

## 2021-05-14 NOTE — Care Plan (Signed)
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  Problem: Adult Inpatient Plan of Care  Goal: Plan of Care Review  Outcome: Ongoing (see interventions/notes)  Flowsheets (Taken 05/14/2021 2000)  Plan of Care Reviewed With: patient  Goal: Patient-Specific Goal (Individualized)  Outcome: Ongoing (see interventions/notes)  Flowsheets (Taken 05/14/2021 2000)  Individualized Care Needs: NEEDS HCS  Anxieties, Fears or Concerns: PROGNOSIS  Patient-Specific Goals (Include Timeframe): MRI BRAIN  Goal: Absence of Hospital-Acquired Illness or Injury  Outcome: Ongoing (see interventions/notes)  Intervention: Identify and Manage Fall Risk  Recent Flowsheet Documentation  Taken 05/14/2021 2200 by Laurian Brim, RN  Safety Promotion/Fall Prevention: safety round/check completed  Taken 05/14/2021 2100 by Laurian Brim, RN  Safety Promotion/Fall Prevention: safety round/check completed  Taken 05/14/2021 2000 by Laurian Brim, RN  Safety Promotion/Fall Prevention: safety round/check completed  Intervention: Prevent Skin Injury  Recent Flowsheet Documentation  Taken 05/14/2021 2000 by Laurian Brim, RN  Body Position: turned q 2 hours  Intervention: Prevent and Manage VTE (Venous Thromboembolism) Risk  Recent Flowsheet Documentation  Taken 05/14/2021 2000 by Laurian Brim, RN  VTE Prevention/Management:   sequential compression devices on   anticoagulant therapy maintained  Intervention: Prevent Infection  Recent Flowsheet Documentation  Taken 05/14/2021 2000 by Laurian Brim, RN  Infection Prevention:   environmental surveillance performed   equipment surfaces disinfected   glycemic control managed    personal protective equipment utilized   promote handwashing   rest/sleep promoted   visitors restricted/screened  Goal: Optimal Comfort and Wellbeing  Outcome: Ongoing (see interventions/notes)  Intervention: Provide Person-Centered Care  Recent Flowsheet Documentation  Taken 05/14/2021 2000 by Laurian Brim, [place] Relationship/Rapport:   care explained   emotional support provided  Goal: Rounds/Family Conference  Outcome: Ongoing (see interventions/notes)     Problem: Fall Injury Risk  Goal: Absence of Fall and Fall-Related Injury  Outcome: Ongoing (see interventions/notes)  Intervention: Promote [place] Documentation  Taken 05/14/2021 2200 by Laurian Brim, RN  Safety Promotion/Fall Prevention: safety round/check completed  Taken 05/14/2021 2100 by Laurian Brim, RN  Safety Promotion/Fall Prevention: safety round/check completed  Taken 05/14/2021 2000 by Laurian Brim, RN  Safety Promotion/Fall Prevention: safety round/check completed     Problem: Pain Acute  Goal: Acceptable Pain Control and Functional Ability  Outcome: Ongoing (see interventions/notes)  Intervention: Prevent or Manage Pain  Recent Flowsheet Documentation  Taken 05/14/2021 2000 by Laurian Brim, RN  Sensory Stimulation Regulation:   care clustered   lighting decreased   quiet environment promoted  Intervention: Optimize Psychosocial Wellbeing  Recent Flowsheet Documentation  Taken 05/14/2021 2000 by Laurian Brim, RN  Supportive Measures: relaxation techniques promoted     Problem: Communication Impairment (Mechanical Ventilation, Invasive)  Goal: Effective Communication  Outcome: Ongoing (see interventions/notes)     Problem: Device-Related Complication Risk (Mechanical Ventilation, Invasive)  Goal: Optimal Device Function  Outcome: Ongoing (see interventions/notes)  Intervention: Optimize Device Care and Function  Recent Flowsheet Documentation  Taken 05/14/2021 2000 by Laurian Brim, RN  Airway  Safety Measures:   suction at bedside   manual resuscitator/mask/valve in room  Aspiration Precautions:   upright posture maintained   tube feeding placement verified     Problem: Inability to Wean (  Mechanical Ventilation, Invasive)  Goal: Mechanical Ventilation Liberation  Outcome: Ongoing (see interventions/notes)     Problem: Nutrition Impairment (Mechanical Ventilation, Invasive)  Goal: Optimal Nutrition Delivery  Outcome: Ongoing (see interventions/notes)     Problem: Skin and Tissue Injury (Mechanical Ventilation, Invasive)  Goal: Absence of Device-Related Skin and Tissue Injury  Outcome: Ongoing (see interventions/notes)     Problem: Ventilator-Induced Lung Injury (Mechanical Ventilation, Invasive)  Goal: Absence of Ventilator-Induced Lung Injury  Outcome: Ongoing (see interventions/notes)  Intervention: Prevent Ventilator-Associated Pneumonia  Recent Flowsheet Documentation  Taken 05/14/2021 2130 by Laurian Brim, RN  Oral Care: oral care with Chlorhexidine provided  Taken 05/14/2021 2000 by Laurian Brim, RN  VAP Prevention Bundle:   HOB elevation maintained   oral care with chlorhexidine   VTE prophylaxis provided   vent circuit breaks minimized   stress ulcer prophylaxis provided  Oral Care: oral care with Chlorhexidine provided  Head of Bed (HOB) Positioning: HOB at 30-45 degrees  VAP Prevention Measures: completed     Problem: Non-violent/Non-Self Destructive Restraints  Goal: Alternative methods tried prior to restraints  Outcome: Ongoing (see interventions/notes)  Goal: Patient free from injury and discomfort  Outcome: Ongoing (see interventions/notes)  Goal: Autonomy maintained at the highest possible level  Outcome: Ongoing (see interventions/notes)  Goal: Need for restraints reassessed per policy  Outcome: Ongoing (see interventions/notes)  Goal: Patient education provided  Outcome: Ongoing (see interventions/notes)  Goal: Problem Interventions  Outcome: Ongoing (see interventions/notes)      Problem: Bleeding (Orthopaedic Fracture)  Goal: Absence of Bleeding  Outcome: Ongoing (see interventions/notes)     Problem: Embolism (Orthopaedic Fracture)  Goal: Absence of Embolism Signs and Symptoms  Outcome: Ongoing (see interventions/notes)  Intervention: Prevent or Manage Embolism Risk  Recent Flowsheet Documentation  Taken 05/14/2021 2000 by Laurian Brim, RN  VTE Prevention/Management:   sequential compression devices on   anticoagulant therapy maintained     Problem: Fracture Stabilization and Management (Orthopaedic Fracture)  Goal: Fracture Stability  Outcome: Ongoing (see interventions/notes)     Problem: Functional Ability Impaired (Orthopaedic Fracture)  Goal: Optimal Functional Ability  Outcome: Ongoing (see interventions/notes)  Intervention: Optimize Functional Ability  Recent Flowsheet Documentation  Taken 05/14/2021 2000 by Laurian Brim, RN  Activity Management: bedrest  Positioning/Transfer Devices: pillows  Range of Motion: ROM (range of motion) performed     Problem: Infection (Orthopaedic Fracture)  Goal: Absence of Infection Signs and Symptoms  Outcome: Ongoing (see interventions/notes)  Intervention: Prevent or Manage Infection  Recent Flowsheet Documentation  Taken 05/14/2021 2000 by Laurian Brim, RN  Fever Reduction/Comfort Measures: room temperature adjusted  Infection Prevention:   environmental surveillance performed   equipment surfaces disinfected   glycemic control managed   personal protective equipment utilized   promote handwashing   rest/sleep promoted   visitors restricted/screened     Problem: Neurovascular Compromise (Orthopaedic Fracture)  Goal: Effective Tissue Perfusion  Outcome: Ongoing (see interventions/notes)     Problem: Pain (Orthopaedic Fracture)  Goal: Acceptable Pain Control  Outcome: Ongoing (see interventions/notes)     Problem: Respiratory Compromise (Orthopaedic Fracture)  Goal: Effective Oxygenation and Ventilation  Outcome: Ongoing (see  interventions/notes)  Intervention: Promote Airway Secretion Clearance  Recent Flowsheet Documentation  Taken 05/14/2021 2000 by Laurian Brim, RN  Cough And Deep Breathing: unable to perform     Problem: ARDS (Acute Respiratory Distress Syndrome)  Goal: Effective Oxygenation  Outcome: Ongoing (see interventions/notes)     Problem: Adjustment to Injury (Multiple Trauma)  Goal: Optimal Coping with Effects of Injury  Outcome: Ongoing (see interventions/notes)  Intervention: Support Adjustment to Injury  Recent Flowsheet Documentation  Taken 05/14/2021 2000 by Laurian Brim, RN  Supportive Measures: relaxation techniques promoted     Problem: Bleeding (Multiple Trauma)  Goal: Absence of Bleeding  Outcome: Ongoing (see interventions/notes)     Problem: Cerebral Tissue Perfusion Risk (Multiple Trauma)  Goal: Effective Cerebral Tissue Perfusion  Outcome: Ongoing (see interventions/notes)  Intervention: Protect and Optimize Cerebral Perfusion  Recent Flowsheet Documentation  Taken 05/14/2021 2000 by Laurian Brim, RN  Sensory Stimulation Regulation:   care clustered   lighting decreased   quiet environment promoted  Seizure Precautions:   activity supervised   emergency equipment at bedside  Cerebral Perfusion Promotion: blood pressure monitored     Problem: Fluid Imbalance (Multiple Trauma)  Goal: Fluid Balance  Outcome: Ongoing (see interventions/notes)     Problem: Functional Ability Impaired (Multiple Trauma)  Goal: Optimal Functional Ability  Outcome: Ongoing (see interventions/notes)  Intervention: Optimize Functional Ability  Recent Flowsheet Documentation  Taken 05/14/2021 2000 by Laurian Brim, RN  Activity Management: bedrest  Positioning/Transfer Devices: pillows  Range of Motion: ROM (range of motion) performed     Problem: Infection (Multiple Trauma)  Goal: Absence of Infection Signs and Symptoms  Outcome: Ongoing (see interventions/notes)  Intervention: Prevent or Manage Infection  Recent Flowsheet  Documentation  Taken 05/14/2021 2000 by Laurian Brim, RN  Fever Reduction/Comfort Measures: room temperature adjusted  Infection Prevention:   environmental surveillance performed   equipment surfaces disinfected   glycemic control managed   personal protective equipment utilized   promote handwashing   rest/sleep promoted   visitors restricted/screened     Problem: Neurovascular Compromise (Multiple Trauma)  Goal: Effective Peripheral Tissue Perfusion  Outcome: Ongoing (see interventions/notes)     Problem: Pain (Multiple Trauma)  Goal: Acceptable Pain Control  Outcome: Ongoing (see interventions/notes)     Problem: Respiratory Compromise (Multiple Trauma)  Goal: Effective Oxygenation and Ventilation  Outcome: Ongoing (see interventions/notes)  Intervention: Promote Airway Secretion Clearance  Recent Flowsheet Documentation  Taken 05/14/2021 2000 by Laurian Brim, RN  Cough And Deep Breathing: unable to perform  Activity Management: bedrest  Intervention: Optimize Oxygenation and Ventilation  Recent Flowsheet Documentation  Taken 05/14/2021 2000 by Laurian Brim, RN  Head of Bed [facility]) Positioning: HOB at 30-45 degrees     Problem: Fluid Imbalance (Pneumonia)  Goal: Fluid Balance  Outcome: Ongoing (see interventions/notes)     Problem: Infection (Pneumonia)  Goal: Resolution of Infection Signs and Symptoms  Outcome: Ongoing (see interventions/notes)  Intervention: Prevent Infection Progression  Recent Flowsheet Documentation  Taken 05/14/2021 2000 by Laurian Brim, RN  Fever Reduction/Comfort Measures: room temperature adjusted     Problem: Respiratory Compromise (Pneumonia)  Goal: Effective Oxygenation and Ventilation  Outcome: Ongoing (see interventions/notes)  Intervention: Promote Airway Secretion Clearance  Recent Flowsheet Documentation  Taken 05/14/2021 2000 by Laurian Brim, RN  Cough And Deep Breathing: unable to perform  Intervention: Optimize Oxygenation and Ventilation  Recent Flowsheet  Documentation  Taken 05/14/2021 2000 by Laurian Brim, RN  Head of Bed [facility]) Positioning: HOB at 30-45 degrees     Problem: Skin Injury Risk Increased  Goal: Skin Health and Integrity  Outcome: Ongoing (see interventions/notes)  Intervention: Optimize Skin Protection  Recent Flowsheet Documentation  Taken 05/14/2021 2000 by Laurian Brim, RN  Pressure Reduction Techniques: (L,M,H,VH)Frequent Turning  Pressure Reduction Devices: heels elevated off bed  Skin Protection:   adhesive use limited   transparent dressing maintained   tubing/devices free from skin  contact  Head of Bed (HOB) Positioning: HOB at 30-45 degrees     Problem: Alcohol Withdrawal  Goal: Alcohol Withdrawal Symptom Control  Outcome: Ongoing (see interventions/notes)  Intervention: Minimize or Manage Alcohol Withdrawal Symptoms  Recent Flowsheet Documentation  Taken 05/14/2021 2000 by Laurian Brim, RN  Sensory Stimulation Regulation:   care clustered   lighting decreased   quiet environment promoted  Aspiration Precautions:   upright posture maintained   tube feeding placement verified  Seizure Precautions:   activity supervised   emergency equipment at bedside     Problem: Acute Neurologic Deterioration (Alcohol Withdrawal)  Goal: Optimal Neurologic Function  Outcome: Ongoing (see interventions/notes)  Intervention: Minimize or Manage Acute Neurologic Symptoms  Recent Flowsheet Documentation  Taken 05/14/2021 2000 by Laurian Brim, RN  Sensory Stimulation Regulation:   care clustered   lighting decreased   quiet environment promoted  Cerebral Perfusion Promotion: blood pressure monitored  Intervention: Prevent Seizure-Related Injury  Recent Flowsheet Documentation  Taken 05/14/2021 2000 by Laurian Brim, RN  Seizure Precautions:   activity supervised   emergency equipment at bedside     Problem: Substance Misuse (Alcohol Withdrawal)  Goal: Readiness for Change Identified  Outcome: Ongoing (see interventions/notes)  Intervention: Partner to  [place] Documentation  Taken 05/14/2021 2000 by Laurian Brim, RN  Supportive Measures: relaxation techniques promoted

## 2021-05-14 NOTE — Nurses Notes (Signed)
Spoke with Dr. Edison Nasuti with neurology for consult. Telephone orders for MRI brain with and without contrast and EEG awake and asleep ordered.

## 2021-05-14 NOTE — Nurses Notes (Signed)
[place] NP notified of increased residual of 420cc. Orders to resume tube feeds at ordered rate and recheck prior to the end of shift.

## 2021-05-14 NOTE — Progress Notes (Signed)
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 improved compared to prior study.           Impression    As above      Radiologist location ID: [id number]     XR AP MOBILE CHEST     Status: None    Narrative    Cheskel LEE Kinderman    RADIOLOGIST: Ileene Hutchinson    XR AP MOBILE CHEST performed on 05/05/2021 1:14 PM    CLINICAL HISTORY: Post Bronchoscopy.  vent     TECHNIQUE: Frontal view of the chest.    COMPARISON:  Chest radiograph dated 05/05/2018    FINDINGS:  The endotracheal tube terminates 5 cm above the carina. An enteric tube courses into the stomach. A left upper extremity PICC terminates near the superior cavoatrial junction.  The heart size is normal.   Lung volumes are low with hazy bibasilar airspace opacities. There is no pneumothorax.   The bones are unremarkable.        Impression    1.Bibasilar atelectasis or pneumonia/aspiration  2.No pneumothorax  3.Endotracheal tube terminating 5 cm above the carina      Radiologist location ID: [id number]     XR AP MOBILE CHEST     Status: None    Narrative    Daniyal LEE Staley    RADIOLOGIST: Berton Bon, MD    XR AP MOBILE CHEST performed on 05/06/2021 6:32 AM    CLINICAL HISTORY: Trauma.  trauma/vent/resp failure    TECHNIQUE: Frontal view of the chest.  COMPARISON:  Yesterday    FINDINGS:  ET tube 3 cm above the carina. There is an NG tube in the stomach. There is  a left PICC catheter in the SVC.   Heart size is moderately enlarged.     There are bilateral perihilar infiltrates   Mild right dorsal scoliosis        Impression    Persistent perihilar infiltrates/vascular congestion slightly worse on the left since yesterday      Radiologist location ID: [id number]     XR AP MOBILE CHEST     Status: None    Narrative    Diamante LEE Jean    RADIOLOGIST: Dorisann Frames, MD    XR AP MOBILE CHEST performed on 05/07/2021 6:11 AM    CLINICAL HISTORY: Trauma.  on vent     TECHNIQUE: Frontal view of the chest.    COMPARISON:  Yesterday    FINDINGS:  The support lines and tubes are in stable and satisfactory position.   Cardiac and mediastinal contours are stable.   No significant change in the appearance of the lungs.   Extensive bilateral pulmonary infiltrates persist with a moderate size left pleural effusion.        Impression    NO SIGNIFICANT CHANGE SINCE THE PRIOR EXAM.      Radiologist location ID: [id number]     [place] CHEST (EFFUSION)     Status: None    Narrative    Riggins LEE Yeo    RADIOLOGIST: Colletta Maryland, MD    [place] CHEST (EFFUSION) performed on 05/07/2021 11:17 AM    CLINICAL HISTORY: pleural effusions..  check left pleural effusion    TECHNIQUE:  Ultrasound imaging of left chest.    COMPARISON:  None.    FINDINGS:  There is a moderate-sized left pleural effusion.        Impression    LEFT PLEURAL EFFUSION        Radiologist location ID: [id number]     US THORACENTESIS     Status: None    Narrative    *Procedure not read by radiology.    *Please Refer to Procedure Note for result.   XR AP MOBILE CHEST     Status: None    Narrative    Izaiah LEE Burgner    RADIOLOGIST: Berton Bon, MD    XR AP MOBILE CHEST performed on 05/07/2021 4:40 PM    CLINICAL HISTORY: POST THORACENTESIS.  s/p left thoracentisis.     TECHNIQUE: Frontal view of the chest.    COMPARISON:  Previous exam from today    FINDINGS:  ET tube 5 cm above the carina. There is an NG tube in the stomach. There is a  left PICC catheter in the SVC   Heart size is mildly enlarged.     There are perihilar and basilar infiltrates. Possible small effusions obscuring the diaphragms   There are multiple left lateral rib fracture deformities        Impression    Negative for pneumothorax after thoracentesis      Radiologist location ID: [id number]     XR AP MOBILE CHEST     Status: None    Narrative    Autrey LEE Keilman    RADIOLOGIST: Otho Najjar, MD    XR AP MOBILE CHEST performed on 05/09/2021 6:15 AM    CLINICAL HISTORY: Mult. L rib fx.s, effusion, intubated.  resp fail-vent, effusion, multiple rib fx's    TECHNIQUE: Frontal view of  the chest.    COMPARISON:  05/07/2021    FINDINGS:  The support lines and tubes are in stable and satisfactory position. No pneumothorax.     There is increasing opacity bilaterally in the mid lung fields with infiltrate, effusion and vascular congestion. This could be inflammatory but the symmetry suggests development of pulmonary edema. Clinical correlation recommended.      Impression    Increasing opacity bilaterally suggesting increasing CHF. Clinical correlation recommended.      Radiologist location ID: [id number]     XR AP MOBILE CHEST     Status: None    Narrative    Derryck LEE Scardina    RADIOLOGIST: Colletta Maryland, MD    XR AP MOBILE CHEST performed on 05/10/2021 6:19 AM    CLINICAL HISTORY: respiratory failure.  resp fail-vent    TECHNIQUE: Frontal view of the chest.    COMPARISON:  05/09/2021    FINDINGS:  The support lines and tubes are in stable and satisfactory position.   Cardiac and mediastinal contours are stable.   Airspace disease is present in the mid to lower lungs with probable bilateral pleural effusions. Lung volumes are slightly improved since yesterday           Impression    BILATERAL AIRSPACE DISEASE AND PLEURAL EFFUSIONS WITH SOME IMPROVEMENT SINCE YESTERDAY      Radiologist location ID: [id number]     CT ANGIO CHEST FOR PULMONARY EMBOLUS W IV CONTRAST     Status: None     Narrative    Christofer LEE Mier    RADIOLOGIST: Colletta Maryland, MD    CT ANGIO CHEST FOR PULMONARY EMBOLUS W IV CONTRAST performed on 05/10/2021 11:49 AM    CLINICAL HISTORY: hypoxia.  on the Vent , increased hypoxia. CXR showed increased opacities  Increase in CHF  Lung sounds coarse  . fall on 05-01-21    TECHNIQUE: CTA imaging of the chest with intravenous contrast.  3D reconstructions.  CONTRAST:  100 ml's of Isovue 370    COMPARISON: 05/03/2021  # of known CTs in the past 12 months: 7   # of known Cardiac Nuclear Medicine Studies in the past 12 months: 0         FINDINGS:  Hardware:  There is an NG tube in the stomach. An ET tube is present in the trachea. There is a left-sided PICC line in the SVC.    Lymph nodes:   No mediastinal, hilar, or axillary lymphadenopathy.    Heart:  Coronary artery calcifications are noted.        RV/LV Diameter Ratio: N/A    Thoracic Aorta:  No thoracic aortic aneurysm or dissection.    Pulmonary Vessels:  No evidence of acute pulmonary emboli through the major subsegmental branches.    Lungs and Airways:  There is patient respiratory motion artifact which limits evaluation of the lungs.      Bilateral lower lobe airspace disease with air bronchograms. Pneumonia versus atelectasis.      Pleura: Bilateral pleural effusions left larger than right    Upper Abdomen: Cirrhosis with splenomegaly. Small volume of ascites. Probable cholelithiasis.    Bones: Bone windows are unremarkable.        Impression    1.NO PULMONARY EMBOLUS  2.BILATERAL PLEURAL EFFUSIONS  3.BILATERAL LOWER LOBE AIRSPACE DISEASE. PNEUMONIA VERSUS ATELECTASIS      One or more dose reduction techniques were used (e.g., Automated exposure control, adjustment of the mA and/or kV according to  patient size, use of iterative reconstruction technique).      Radiologist location ID: [id number]     XR AP MOBILE CHEST     Status: None    Narrative    Juwann LEE Aspinall    RADIOLOGIST: Colletta Maryland, MD    XR AP MOBILE CHEST performed  on 05/11/2021 5:35 AM    CLINICAL HISTORY: respiratory failure.  resp fail-vent    TECHNIQUE: Frontal view of the chest.    COMPARISON:  Yesterday    FINDINGS:  The support lines and tubes are in stable and satisfactory position.   Cardiac and mediastinal contours are stable.   Bilateral airspace disease with bilateral pleural effusions similar to yesterday           Impression    NO ACUTE FINDINGS.      Radiologist location ID: [id number]     CT BRAIN WO IV CONTRAST     Status: None    Narrative    Mia LEE Capece    RADIOLOGIST: Colletta Maryland, MD    CT BRAIN WO IV CONTRAST performed on 05/12/2021 2:27 AM    CLINICAL HISTORY: altered mental status.  Pt intubated and per RN very restless this early AM    TECHNIQUE:  Head CT without intravenous contrast.    COMPARISON: 05/01/2021  # of known CTs in the past 12 months: 8   # of known Cardiac Nuclear Medicine Studies in the past 12 months: 0    FINDINGS: This study is degraded by patient motion artifact  There is no acute intracranial hemorrhage, mass effect, or evidence of large acute infarct.    Brain: Low density in the periventricular white matter suggests mild chronic small vessel ischemic changes.    CSF Spaces: Mild generalized cerebral atrophy     Sinuses/Mastoids:  Clear at visualized levels     Bones: Unremarkable        Impression    CHRONIC CHANGES.  NO ACUTE FINDINGS.       One or more dose reduction techniques were used (e.g., Automated exposure control, adjustment of the mA and/or kV according to patient size, use of iterative reconstruction technique).      Radiologist location ID: [id number]     XR AP MOBILE CHEST     Status: None    Narrative    Phillip LEE Mefferd    RADIOLOGIST: Colletta Maryland, MD    XR AP MOBILE CHEST performed on 05/12/2021 6:20 AM    CLINICAL HISTORY: respiratory failure.  vent management     TECHNIQUE: Frontal view of the chest.    COMPARISON:  Yesterday    FINDINGS:  The support lines and tubes are in stable and satisfactory  position.   Cardiac and mediastinal contours are stable.   There is a left pleural effusion and bilateral airspace disease with possible right pleural effusion. Mild improvement in the right lung since yesterday.           Impression    BILATERAL AIRSPACE DISEASE AND PLEURAL EFFUSIONS LEFT WORSE      Radiologist location ID: [id number]     XR AP MOBILE CHEST     Status: None    Narrative    Darryel LEE Woessner    RADIOLOGIST: Colletta Maryland, MD    XR AP MOBILE CHEST performed on 05/13/2021 8:23 AM    CLINICAL HISTORY: respiratory failure, PNA.  resp.failure, PNA, vent    TECHNIQUE: Frontal view of the chest.  COMPARISON:  Yesterday    FINDINGS:  The support lines and tubes are in stable and satisfactory position.   Cardiac and mediastinal contours are stable.   There is a left pleural effusion unchanged from yesterday. The right lung is improved since yesterday with improved lung volumes.           Impression    PERSISTENT LEFT PLEURAL EFFUSION      Radiologist location ID: [id number]     XR AP MOBILE CHEST     Status: None    Narrative    Dowell LEE Deremer    RADIOLOGIST: Berton Bon, MD    XR AP MOBILE CHEST performed on 05/14/2021 6:07 AM    CLINICAL HISTORY: Pleural effusions.  vent management. pleural effusion    TECHNIQUE: Frontal view of the chest.    COMPARISON:  Yesterday    FINDINGS:  ET tube 4.8 cm above the carina.. There is an NG tube extending at least into the distal esophagus and obscured over the upper abdomen. There is a left PICC catheter in the SVC   Heart size is moderately enlarged.     There are perihilar infiltrates with possible effusions obscuring the diaphragms especially on the left           Impression    Stable infiltrates/vascular congestion with probable effusions      Radiologist location ID: [id number]        All current radiology studies were reviewed.    PHYSICAL EXAMINATION  Temperature: (!) 38.5 C (101.3 F)  Heart Rate: 98  BP (Non-Invasive): 138/78  Respiratory Rate: (!)  34  SpO2: 98 %    General: 60 yo male sedated and maintained on MV   HEENT: Normocephalic.  Oral endotracheal tube and orogastric tube in place  Neck: Trach midline, supple   Heart: Sinus rhythm, HR 70's. 2+ pitting edema. Pulses 2+. Brisk capillary refill   Lungs: Maintained on mechanical ventilation. Breath sounds CTA, diminished laterally. Small thick, white secretions from ETT currently  Abdomen: Soft, round, non-tender on palpation, BS normoactive, OGT with tube feedings infusing/ tolerating. Reported BM 5/27, soft/ brown  Renal: Foley catheter draining amber urine to gravity   Extremities: 1+ edema noted to b/l hands, 2+ pitting edema to BLE  Skin: Warm and dry  Neuro: Sedated for agitation. Does not follow commands or make eye contact. Has visual threat and protectives intact. Moves extremities spontaneously. BLE w/d to noxious stimuli  Psych: Sedated     VENTILATOR/SETTINGS:       OET= Size 7.5 mm; taped at 26 cm       Mode:  Assist control       Tidal Volume:  500       Ventilator Rate:  22       FI02:  60%       PEEP:  10 cm       Peak air pressure is 26 cm and mean airway pressure 12       PROBLEM LIST:   Active Hospital Problems   (*Primary Problem)    Diagnosis   . *Spleen injury   . Pleural effusion   . On mechanically assisted ventilation (CMS HCC)   . Multiple rib fractures   . Left pulmonary contusion   . Closed fracture of transverse process of thoracic vertebra (CMS HCC)   . 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 Thrombocytopenia (CMS HCC)   . Alcohol abuse  NEURO:  - Hx alcoholism  - S/p trauma with fall down 11 stairs while intoxicated   -  On multivitamin and thiamine  - CTH: No acute intracranial findings. Left posterior occipital scalp hematoma  - CT C-spine and lumbar spine negative   - CT thoracic spine: Multiple rib fractures (medial left 4th- 6th and sixth ribs and posterior lateral left 4th-7th . Possible non-displaced left transverse process fractures of T5 and T6   -  Started Ketamine infusion 5/30, weaning Fentanyl. Scheduled and PRN Zyprexa dosing  - Plan to wean Precedex following Ketamine  - Seroquel and Serax discontinued 1/74  - Toxic metabolic encephalopathy -> continues with agitation and altered mental status, Ammonia 42 5/31  - Lactulose dosing increased to 45m Q6H   - [facility] with no acute findings  - Daily sedation vacation for neuro assessments; currently not tolerating  - Neurology consulted for further recommendations    RESP:  - Acute respiratory failure with hypoxia ->transferred to ICU on 5/19 for intubation   - CTA chest: small left pneumothorax <5% resolved on follow up the following day. Rt>Lt. Trace left pleural effusion. Multiple displaced and comminuted left rib fractures. SubQ air left chest wall extending into the left flank  - CT with possible small bilat LL collapse/plugging - 05/05/21 bronch with multiple small casts from BLL - repeat CXR with no change. No cultures obtained at that time.  - Worsening hypoxia with pO2 60's, peep increased to 10 on 5/27 -> Covid swab and CTA r/o PE -> both negative. CTA with persistent b/l effusions, may require thoracentesis in the future  - DuoNebs every 4 hours as needed  - Strep pneumo PNA  -> Unasyn completed 10 days, D/C 5/31  - Serial CXRs and ABGs  - SBT 5/28 with NIF -33, RSBI 44 and rapid desaturation  - CXR 5/30 with improved lung volumes  - Neuro status and ventilator support currently preclude SBT/extubation, may require tracheostomy     CVS:  - Hx HTN  - Hypertensive ->resolved while sedated  - PRN Lopressor  - Routine hemodynamic monitoring and continuous telemetry    GI:  - CTA abdomen/ pelvis: Grade 3 splenic injury. Cirrhosis with evidence of portal hypertension  - Repeat CT A/Pon5/20: Stable splenic laceration  - Continue daily H/H's if he bleeds he will require embolization  - NPO  -Tube feeding, tolerating at goal   -Prophylaxis: IV Pepcid  - Bowel regimen: Colace and Senna  -  Hyperammonemia 42 today  - Lactulose dosing increased to 46mQ6H today    Renal:  - Received 80 mg Lasix x 1 on 5/27 with total 3L UO in 24 hrs.  - Changed Lasix dosing to 40 mg Q8hr with Albumin. Remains +3L with good diuresis  - 4.9L UO x 24 hours, remains + for LOS  - Foley catheter for strict I/O's  - Monitor and correct electrolyte imbalance as needed  - Serial BMPs    HEME:  - No active bleeding noted; H/H stable  - Thrombocytopeniacontinue to trend, plt 85  - SQ Lovenox andSCD's for prophylaxis  - Serial CBC differential    ID:  - WBC 6.3  - Pneumonia -> Unasyn D#11 (Unasyn dose increased 5/21) -> D/C'd 5/31   - Sputum culture growing 5/21 strep pneumoniae -> Repeat 5/29 with few GPC and few yeast   - Urine strep pneumo and legionellanegative  - Procalcitonin0.10  -Blood cultures negative  - UAwith no signs of infective  source  - Vaccinations ordered, not given yet  - Tmax 101 -> re-cultured 5/28    ENDO:  - TSH4.13and HA1C5.3  - POCT glucometer Q 6 hours, add SSI as needed for optimal glycemic controland will continue adjust as necessary to achieve optimal BS levels    Prophylaxis:  -Lovenox, SCDs, Pepcid, Peridex,HOB > 30, pulmonary hygiene    Code Status:  Full Code     Critical Care Time: 45 minutes    I have personally examined the patient at the bedside; reviewed the medical chart, laboratory data, radiologic imaging and discussed patient's case at bedside with multidisciplinary critical care team. Critical Care excludes any bedside procedures or teaching. Dr. Rosanna Randy available for collaboration at all times.    [place]lington Calix, FNP-C

## 2021-05-14 NOTE — OT Treatment (Signed)
[facility]  Occupational Therapy Progress Note    Patient Name: Hector [name]  Date of Birth: 1961-02-09  Height:  182.9 cm (6')  Weight:  106 kg (234 lb 5.6 oz)  Room/Bed: 367/A  Payor: [employer]ICA[employer] / Plan: [employer]AN MEDICAID / Product Type: Medicaid [facility] /     Assessment:       05/14/21 0900   Daily Activity AM-PAC/6-clicks Score   Putting on/Taking off clothing on lower body 1   Bathing 1   Toileting 1   Putting on/Taking off clothing on upper body 1   Personal grooming 1   Eating Meals 1   Raw Score Total 6   Standardized (t-scale) Score 17.07   CMS 0-100% Score 100   CMS Modifier CN       Discharge Needs:   Equipment Recommendation: to be determined    Discharge Disposition: LTACH    Plan:   Continue to follow patient according to established plan of care.  The risks/benefits of therapy have been discussed with the patient/caregiver and he/she is in agreement with the established plan of care.     Subjective & Objective:     Sedated, intubated/vent.  FiO2 60, PEEP 10.  Does not follow commands.  Opens eyes to verbal stimuli.  OT provided orientation information and PROM BUE within available ranges.  Tolerated without signs of distress/discomfort.  Plan:  will follow and progress as indicated and tolerated.      Pain: 2/10 faces scale    Bathing D  Dressing D  Toileting D  Grooming D  Eating D    Patient education orientation information provided.            Therapist:   Landry Corporal, OT     Start Time: 0900  End Time: 0910  Total Treatment Time: 10 minutes  Charges Entered: TEx1  Department Number: [phone]

## 2021-05-14 NOTE — Care Plan (Signed)
PATIENT ON MECHANICAL VENTILATION 60%FiO2 10PEEP; CXR SHOWS PERSISTENT LEFT PLEURAL EFFUSION-CONTINUE ALBUMIN/LASIX DIURESIS (ALBUMIN INCREASED); POTASSIUM REPLACEMENT; UNASYN CONTINUES; SEDATION ADJUSTMENT WITH ADDITION OF KETAMINE AND PRECEDEX WHILE WEANING FENTANYL; OPTIMIZE NUTRITION; PREVENT VAP CAUTI CLABSI  Problem: Adult Inpatient Plan of Care  Goal: Plan of Care Review  Outcome: Ongoing (see interventions/notes)  Flowsheets (Taken 05/13/2021 2000)  Plan of Care Reviewed With: patient  Goal: Patient-Specific Goal (Individualized)  Outcome: Ongoing (see interventions/notes)  Flowsheets (Taken 05/13/2021 2000)  Individualized Care Needs: NEEDS HCS  Anxieties, Fears or Concerns: PROGNOSIS  Patient-Specific Goals (Include Timeframe): WEAN FENTANYL  Goal: Absence of Hospital-Acquired Illness or Injury  Outcome: Ongoing (see interventions/notes)  Intervention: Identify and Manage Fall Risk  Recent Flowsheet Documentation  Taken 05/14/2021 0100 by Laurian Brim, RN  Safety Promotion/Fall Prevention: safety round/check completed  Taken 05/14/2021 0000 by Laurian Brim, RN  Safety Promotion/Fall Prevention: safety round/check completed  Taken 05/13/2021 2300 by Laurian Brim, RN  Safety Promotion/Fall Prevention: safety round/check completed  Taken 05/13/2021 2200 by Laurian Brim, RN  Safety Promotion/Fall Prevention: safety round/check completed  Taken 05/13/2021 2100 by Laurian Brim, RN  Safety Promotion/Fall Prevention: safety round/check completed  Taken 05/13/2021 2000 by Laurian Brim, RN  Safety Promotion/Fall Prevention: safety round/check completed  Intervention: Prevent Skin Injury  Recent Flowsheet Documentation  Taken 05/14/2021 0000 by Laurian Brim, RN  Body Position: turned q 2 hours  Taken 05/13/2021 2000 by Laurian Brim, RN  Body Position: turned q 2 hours  Intervention: Prevent and Manage VTE (Venous Thromboembolism) Risk  Recent Flowsheet Documentation  Taken 05/14/2021 0000 by Laurian Brim, RN  VTE Prevention/Management:   anticoagulant therapy maintained   sequential compression devices on  Taken 05/13/2021 2000 by Laurian Brim, RN  VTE Prevention/Management:   anticoagulant therapy maintained   sequential compression devices on  Intervention: Prevent Infection  Recent Flowsheet Documentation  Taken 05/14/2021 0000 by Laurian Brim, RN  Infection Prevention:   environmental surveillance performed   equipment surfaces disinfected   glycemic control managed   personal protective equipment utilized   promote handwashing   rest/sleep promoted  Taken 05/13/2021 2000 by Laurian Brim, RN  Infection Prevention:   environmental surveillance performed   equipment surfaces disinfected   glycemic control managed   personal protective equipment utilized   promote handwashing   rest/sleep promoted   visitors restricted/screened  Goal: Optimal Comfort and Wellbeing  Outcome: Ongoing (see interventions/notes)  Intervention: Provide Person-Centered Care  Recent Flowsheet Documentation  Taken 05/14/2021 0000 by Laurian Brim, RN  Trust Relationship/Rapport:   care explained   emotional support provided  Taken 05/13/2021 2000 by Laurian Brim, RN  Trust Relationship/Rapport:   care explained   emotional support provided   reassurance provided  Goal: Rounds/Family Conference  Outcome: Ongoing (see interventions/notes)     Problem: Fall Injury Risk  Goal: Absence of Fall and Fall-Related Injury  Outcome: Ongoing (see interventions/notes)  Intervention: Promote Injury-Free Environment  Recent Flowsheet Documentation  Taken 05/14/2021 0100 by Laurian Brim, RN  Safety Promotion/Fall Prevention: safety round/check completed  Taken 05/14/2021 0000 by Laurian Brim, RN  Safety Promotion/Fall Prevention: safety round/check completed  Taken 05/13/2021 2300 by Laurian Brim, RN  Safety Promotion/Fall Prevention: safety round/check completed  Taken 05/13/2021 2200 by Laurian Brim, RN  Safety Promotion/Fall  Prevention: safety round/check completed  Taken 05/13/2021 2100 by Laurian Brim, RN  Safety Promotion/Fall Prevention: safety round/check completed  Taken 05/13/2021 2000 by Laurian Brim, RN  Safety Promotion/Fall Prevention: safety round/check completed  Problem: Pain Acute  Goal: Acceptable Pain Control and Functional Ability  Outcome: Ongoing (see interventions/notes)  Intervention: Prevent or Manage Pain  Recent Flowsheet Documentation  Taken 05/14/2021 0000 by Laurian Brim, RN  Sensory Stimulation Regulation:   care clustered   lighting decreased   quiet environment promoted  Taken 05/13/2021 2000 by Laurian Brim, RN  Sensory Stimulation Regulation:   care clustered   lighting decreased   quiet environment promoted  Intervention: [place] Documentation  Taken 05/14/2021 0000 by Laurian Brim, RN  Supportive Measures: relaxation techniques promoted     Problem: Communication Impairment (Mechanical Ventilation, Invasive)  Goal: Effective Communication  Outcome: Ongoing (see interventions/notes)     Problem: Device-Related Complication Risk (Mechanical Ventilation, Invasive)  Goal: Optimal Device Function  Outcome: Ongoing (see interventions/notes)  Intervention: Optimize Device Care and Function  Recent Flowsheet Documentation  Taken 05/14/2021 0000 by Laurian Brim, RN  Airway Safety Measures:   suction at bedside   manual resuscitator/mask/valve in room  Aspiration Precautions:   upright posture maintained   tube feeding placement verified  Taken 05/13/2021 2000 by Laurian Brim, RN  Airway Safety Measures:   suction at bedside   manual resuscitator/mask/valve in room  Aspiration Precautions:   upright posture maintained   tube feeding placement verified     Problem: Inability to Wean (Mechanical Ventilation, Invasive)  Goal: Mechanical Ventilation Liberation  Outcome: Ongoing (see interventions/notes)     Problem: Nutrition Impairment (Mechanical  Ventilation, Invasive)  Goal: Optimal Nutrition Delivery  Outcome: Ongoing (see interventions/notes)     Problem: Skin and Tissue Injury (Mechanical Ventilation, Invasive)  Goal: Absence of Device-Related Skin and Tissue Injury  Outcome: Ongoing (see interventions/notes)     Problem: Ventilator-Induced Lung Injury (Mechanical Ventilation, Invasive)  Goal: Absence of Ventilator-Induced Lung Injury  Outcome: Ongoing (see interventions/notes)  Intervention: Prevent Ventilator-Associated Pneumonia  Recent Flowsheet Documentation  Taken 05/14/2021 0000 by Laurian Brim, RN  VAP Prevention Bundle:   HOB elevation maintained   oral care with chlorhexidine   VTE prophylaxis provided   vent circuit breaks minimized   stress ulcer prophylaxis provided  Oral Care: oral care provided  Head of Bed [facility]) Positioning: HOB at 30-45 degrees  VAP Prevention Measures: completed  Taken 05/13/2021 2000 by Laurian Brim, RN  VAP Prevention Bundle:   HOB elevation maintained   oral care with chlorhexidine   VTE prophylaxis provided   vent circuit breaks minimized   stress ulcer prophylaxis provided  Oral Care: oral care with Chlorhexidine provided  Head of Bed (HOB) Positioning: HOB at 30-45 degrees  VAP Prevention Measures: completed     Problem: Non-violent/Non-Self Destructive Restraints  Goal: Alternative methods tried prior to restraints  Outcome: Ongoing (see interventions/notes)  Goal: Patient free from injury and discomfort  Outcome: Ongoing (see interventions/notes)  Goal: Autonomy maintained at the highest possible level  Outcome: Ongoing (see interventions/notes)  Goal: Need for restraints reassessed per policy  Outcome: Ongoing (see interventions/notes)  Goal: Patient education provided  Outcome: Ongoing (see interventions/notes)  Goal: Problem Interventions  Outcome: Ongoing (see interventions/notes)     Problem: Bleeding (Orthopaedic Fracture)  Goal: Absence of Bleeding  Outcome: Ongoing (see interventions/notes)      Problem: Embolism (Orthopaedic Fracture)  Goal: Absence of Embolism Signs and Symptoms  Outcome: Ongoing (see interventions/notes)  Intervention: Prevent or Manage Embolism Risk  Recent Flowsheet Documentation  Taken 05/14/2021 0000 by Laurian Brim, RN  VTE Prevention/Management:   anticoagulant therapy maintained   sequential compression devices  on  Taken 05/13/2021 2000 by Laurian Brim, RN  VTE Prevention/Management:   anticoagulant therapy maintained   sequential compression devices on     Problem: Fracture Stabilization and Management (Orthopaedic Fracture)  Goal: Fracture Stability  Outcome: Ongoing (see interventions/notes)     Problem: Functional Ability Impaired (Orthopaedic Fracture)  Goal: Optimal Functional Ability  Outcome: Ongoing (see interventions/notes)  Intervention: Optimize Functional Ability  Recent Flowsheet Documentation  Taken 05/14/2021 0000 by Laurian Brim, RN  Activity Management: bedrest  Positioning/Transfer Devices: pillows  Taken 05/13/2021 2000 by Laurian Brim, RN  Activity Management: bedrest  Positioning/Transfer Devices: pillows  Range of Motion: ROM (range of motion) performed     Problem: Infection (Orthopaedic Fracture)  Goal: Absence of Infection Signs and Symptoms  Outcome: Ongoing (see interventions/notes)  Intervention: Prevent or Manage Infection  Recent Flowsheet Documentation  Taken 05/14/2021 0000 by Laurian Brim, RN  Fever Reduction/Comfort Measures: room temperature adjusted  Infection Prevention:   environmental surveillance performed   equipment surfaces disinfected   glycemic control managed   personal protective equipment utilized   promote handwashing   rest/sleep promoted  Taken 05/13/2021 2000 by Laurian Brim, RN  Fever Reduction/Comfort Measures: room temperature adjusted  Infection Prevention:   environmental surveillance performed   equipment surfaces disinfected   glycemic control managed   personal protective equipment utilized   promote  handwashing   rest/sleep promoted   visitors restricted/screened     Problem: Neurovascular Compromise (Orthopaedic Fracture)  Goal: Effective Tissue Perfusion  Outcome: Ongoing (see interventions/notes)     Problem: Pain (Orthopaedic Fracture)  Goal: Acceptable Pain Control  Outcome: Ongoing (see interventions/notes)     Problem: Respiratory Compromise (Orthopaedic Fracture)  Goal: Effective Oxygenation and Ventilation  Outcome: Ongoing (see interventions/notes)  Intervention: Promote Airway Secretion Clearance  Recent Flowsheet Documentation  Taken 05/14/2021 0000 by Laurian Brim, RN  Cough And Deep Breathing: unable to perform  Taken 05/13/2021 2000 by Laurian Brim, RN  Cough And Deep Breathing: unable to perform     Problem: ARDS (Acute Respiratory Distress Syndrome)  Goal: Effective Oxygenation  Outcome: Ongoing (see interventions/notes)     Problem: Adjustment to Injury (Multiple Trauma)  Goal: Optimal Coping with Effects of Injury  Outcome: Ongoing (see interventions/notes)  Intervention: Support Adjustment to Injury  Recent Flowsheet Documentation  Taken 05/14/2021 0000 by Laurian Brim, RN  Supportive Measures: relaxation techniques promoted     Problem: Bleeding (Multiple Trauma)  Goal: Absence of Bleeding  Outcome: Ongoing (see interventions/notes)     Problem: Cerebral Tissue Perfusion Risk (Multiple Trauma)  Goal: Effective Cerebral Tissue Perfusion  Outcome: Ongoing (see interventions/notes)  Intervention: Protect and Optimize Cerebral Perfusion  Recent Flowsheet Documentation  Taken 05/14/2021 0000 by Laurian Brim, RN  Sensory Stimulation Regulation:   care clustered   lighting decreased   quiet environment promoted  Seizure Precautions:   activity supervised   clutter-free environment maintained   emergency equipment at bedside  Cerebral Perfusion Promotion: blood pressure monitored  Taken 05/13/2021 2000 by Laurian Brim, RN  Sensory Stimulation Regulation:   care clustered   lighting  decreased   quiet environment promoted  Seizure Precautions:   activity supervised   clutter-free environment maintained   emergency equipment at bedside  Cerebral Perfusion Promotion: blood pressure monitored     Problem: Fluid Imbalance (Multiple Trauma)  Goal: Fluid Balance  Outcome: Ongoing (see interventions/notes)     Problem: Functional Ability Impaired (Multiple Trauma)  Goal: Optimal Functional Ability  Outcome: Ongoing (see interventions/notes)  Intervention:  Optimize Functional Ability  Recent Flowsheet Documentation  Taken 05/14/2021 0000 by Laurian Brim, RN  Activity Management: bedrest  Positioning/Transfer Devices: pillows  Taken 05/13/2021 2000 by Laurian Brim, RN  Activity Management: bedrest  Positioning/Transfer Devices: pillows  Range of Motion: ROM (range of motion) performed     Problem: Infection (Multiple Trauma)  Goal: Absence of Infection Signs and Symptoms  Outcome: Ongoing (see interventions/notes)  Intervention: Prevent or Manage Infection  Recent Flowsheet Documentation  Taken 05/14/2021 0000 by Laurian Brim, RN  Fever Reduction/Comfort Measures: room temperature adjusted  Infection Prevention:   environmental surveillance performed   equipment surfaces disinfected   glycemic control managed   personal protective equipment utilized   promote handwashing   rest/sleep promoted  Taken 05/13/2021 2000 by Laurian Brim, RN  Fever Reduction/Comfort Measures: room temperature adjusted  Infection Prevention:   environmental surveillance performed   equipment surfaces disinfected   glycemic control managed   personal protective equipment utilized   promote handwashing   rest/sleep promoted   visitors restricted/screened     Problem: Neurovascular Compromise (Multiple Trauma)  Goal: Effective Peripheral Tissue Perfusion  Outcome: Ongoing (see interventions/notes)     Problem: Pain (Multiple Trauma)  Goal: Acceptable Pain Control  Outcome: Ongoing (see interventions/notes)     Problem:  Respiratory Compromise (Multiple Trauma)  Goal: Effective Oxygenation and Ventilation  Outcome: Ongoing (see interventions/notes)  Intervention: Promote Airway Secretion Clearance  Recent Flowsheet Documentation  Taken 05/14/2021 0000 by Laurian Brim, RN  Cough And Deep Breathing: unable to perform  Activity Management: bedrest  Taken 05/13/2021 2000 by Laurian Brim, RN  Cough And Deep Breathing: unable to perform  Activity Management: bedrest  Intervention: Optimize Oxygenation and Ventilation  Recent Flowsheet Documentation  Taken 05/14/2021 0000 by Laurian Brim, RN  Head of Bed [facility]) Positioning: HOB at 30-45 degrees  Taken 05/13/2021 2000 by Laurian Brim, RN  Head of Bed [facility]) Positioning: HOB at 30-45 degrees     Problem: Fluid Imbalance (Pneumonia)  Goal: Fluid Balance  Outcome: Ongoing (see interventions/notes)     Problem: Infection (Pneumonia)  Goal: Resolution of Infection Signs and Symptoms  Outcome: Ongoing (see interventions/notes)  Intervention: Prevent Infection Progression  Recent Flowsheet Documentation  Taken 05/14/2021 0000 by Laurian Brim, RN  Fever Reduction/Comfort Measures: room temperature adjusted  Taken 05/13/2021 2000 by Laurian Brim, RN  Fever Reduction/Comfort Measures: room temperature adjusted     Problem: Respiratory Compromise (Pneumonia)  Goal: Effective Oxygenation and Ventilation  Outcome: Ongoing (see interventions/notes)  Intervention: Promote Airway Secretion Clearance  Recent Flowsheet Documentation  Taken 05/14/2021 0000 by Laurian Brim, RN  Cough And Deep Breathing: unable to perform  Taken 05/13/2021 2000 by Laurian Brim, RN  Cough And Deep Breathing: unable to perform  Intervention: Optimize Oxygenation and Ventilation  Recent Flowsheet Documentation  Taken 05/14/2021 0000 by Laurian Brim, RN  Head of Bed [facility]) Positioning: HOB at 30-45 degrees  Taken 05/13/2021 2000 by Laurian Brim, RN  Head of Bed [facility]) Positioning: HOB at 30-45 degrees     Problem:  Skin Injury Risk Increased  Goal: Skin Health and Integrity  Outcome: Ongoing (see interventions/notes)  Intervention: Optimize Skin Protection  Recent Flowsheet Documentation  Taken 05/14/2021 0000 by Laurian Brim, RN  Pressure Reduction Techniques: (L,M,H,VH)Frequent Turning  Pressure Reduction Devices: heels elevated off bed  Skin Protection:   adhesive use limited   transparent dressing maintained   tubing/devices free from skin contact  Head of Bed (HOB) Positioning: HOB at 30-45 degrees  Taken 05/13/2021  2000 by Laurian Brim, RN  Pressure Reduction Techniques: (L,M,H,VH)Frequent Turning  Pressure Reduction Devices: heels elevated off bed  Skin Protection:   adhesive use limited   tubing/devices free from skin contact   transparent dressing maintained  Head of Bed (HOB) Positioning: HOB at 30-45 degrees     Problem: Alcohol Withdrawal  Goal: Alcohol Withdrawal Symptom Control  Outcome: Ongoing (see interventions/notes)  Intervention: Minimize or Manage Alcohol Withdrawal Symptoms  Recent Flowsheet Documentation  Taken 05/14/2021 0000 by Laurian Brim, RN  Sensory Stimulation Regulation:   care clustered   lighting decreased   quiet environment promoted  Aspiration Precautions:   upright posture maintained   tube feeding placement verified  Seizure Precautions:   activity supervised   clutter-free environment maintained   emergency equipment at bedside  Taken 05/13/2021 2000 by Laurian Brim, RN  Sensory Stimulation Regulation:   care clustered   lighting decreased   quiet environment promoted  Aspiration Precautions:   upright posture maintained   tube feeding placement verified  Seizure Precautions:   activity supervised   clutter-free environment maintained   emergency equipment at bedside     Problem: Acute Neurologic Deterioration (Alcohol Withdrawal)  Goal: Optimal Neurologic Function  Outcome: Ongoing (see interventions/notes)  Intervention: Minimize or Manage Acute Neurologic Symptoms  Recent  Flowsheet Documentation  Taken 05/14/2021 0000 by Laurian Brim, RN  Sensory Stimulation Regulation:   care clustered   lighting decreased   quiet environment promoted  Cerebral Perfusion Promotion: blood pressure monitored  Taken 05/13/2021 2000 by Laurian Brim, RN  Sensory Stimulation Regulation:   care clustered   lighting decreased   quiet environment promoted  Cerebral Perfusion Promotion: blood pressure monitored  Intervention: Prevent Seizure-Related Injury  Recent Flowsheet Documentation  Taken 05/14/2021 0000 by Laurian Brim, RN  Seizure Precautions:   activity supervised   clutter-free environment maintained   emergency equipment at bedside  Taken 05/13/2021 2000 by Laurian Brim, RN  Seizure Precautions:   activity supervised   clutter-free environment maintained   emergency equipment at bedside     Problem: Substance Misuse (Alcohol Withdrawal)  Goal: Readiness for Change Identified  Outcome: Ongoing (see interventions/notes)  Intervention: Partner to Facilitate Behavior Change  Recent Flowsheet Documentation  Taken 05/14/2021 0000 by Laurian Brim, RN  Supportive Measures: relaxation techniques promoted

## 2021-05-14 NOTE — PT Treatment (Signed)
[place] [facility] [place], [place] [address]  [phone]         Physical Therapy Acute Care Daily Treatment Record       Date: 05/14/2021  Time: 1119  Patient's Name: Hector [name]  Date of Birth: May 06, 1961  Room Number: 367/A    Pertinent Information since last Physical Therapy visit :    Remains in ICU, intubated      Precautions / Limitations :    No known precautions / limitations     Weight Bearing status, if applicable :    No Weightbearing restrictions     Subjective :   Orally intubated     Pain Rating :      Comments:    No response to ROM                               Orientation & Level of Orientation :    Unresponsive and Sedated      6 Clicks Score :    1.) Turning from back to side while flat in bed without using a bed rail.    Total assistance (1)  2.) Moving from lying on back to sitting on the side of a flat bed without using bed rails.   Total assistance (1)  3.) Moving to and from a bed to a chair, including a wheelchair.    Total assistance (1)  4.) Standing up for chair using your arms (eg, wheelchair or bedside chair).   Total assistance (1)  5.) Walking in hospital room.   Total assistance (1)  6.) Climbing 3-5 steps with a railing.    Total assistance (1)    6 Clicks Plan: Score <94 - PT consult is appropriate; placement may be necessary at discharge      Treatment :         Sitting Balance:                            Standing Balance:                    Stairs:                            Therapeutic Exercise:  PROM to le's while supine in bed. No response to treatment.                 Therapeutic Activity:             Canalith Repositioning:             Other Treatment Interventions:       Daily Assessment of Status :    Remains in ICU. No response to treatment.       Timed Charge Number of Minutes   Neuromuscular Re-Education     Gait Training      Therapeutic Activity      Therapeutic Exercise   8   Canalith  Re-positioning      Other     Total Treatment Time  8     Patient Education :            Interdisciplinary Communication :                Discharge Planning :  Long Term Acute Care (LTAC)    Plan :   Continue Physical Therapy program     Jackie Plum, PT, DPT  05/14/2021, 13:12

## 2021-05-14 NOTE — Care Plan (Signed)
Problem: Adult Inpatient Plan of Care  Goal: Plan of Care Review  Outcome: Ongoing (see interventions/notes)  Goal: Patient-Specific Goal (Individualized)  Outcome: Ongoing (see interventions/notes)  Goal: Absence of Hospital-Acquired Illness or Injury  Outcome: Ongoing (see interventions/notes)  Goal: Optimal Comfort and Wellbeing  Outcome: Ongoing (see interventions/notes)  Goal: Rounds/Family Conference  Outcome: Ongoing (see interventions/notes)     Problem: Fall Injury Risk  Goal: Absence of Fall and Fall-Related Injury  Outcome: Ongoing (see interventions/notes)     Problem: Pain Acute  Goal: Acceptable Pain Control and Functional Ability  Outcome: Ongoing (see interventions/notes)     Problem: Communication Impairment (Mechanical Ventilation, Invasive)  Goal: Effective Communication  Outcome: Ongoing (see interventions/notes)     Problem: Device-Related Complication Risk (Mechanical Ventilation, Invasive)  Goal: Optimal Device Function  Outcome: Ongoing (see interventions/notes)     Problem: Inability to Wean (Mechanical Ventilation, Invasive)  Goal: Mechanical Ventilation Liberation  Outcome: Ongoing (see interventions/notes)     Problem: Nutrition Impairment (Mechanical Ventilation, Invasive)  Goal: Optimal Nutrition Delivery  Outcome: Ongoing (see interventions/notes)     Problem: Skin and Tissue Injury (Mechanical Ventilation, Invasive)  Goal: Absence of Device-Related Skin and Tissue Injury  Outcome: Ongoing (see interventions/notes)     Problem: Ventilator-Induced Lung Injury (Mechanical Ventilation, Invasive)  Goal: Absence of Ventilator-Induced Lung Injury  Outcome: Ongoing (see interventions/notes)     Problem: Non-violent/Non-Self Destructive Restraints  Goal: Alternative methods tried prior to restraints  Outcome: Ongoing (see interventions/notes)  Goal: Patient free from injury and discomfort  Outcome: Ongoing (see interventions/notes)  Goal: Autonomy maintained at the highest possible  level  Outcome: Ongoing (see interventions/notes)  Goal: Need for restraints reassessed per policy  Outcome: Ongoing (see interventions/notes)  Goal: Patient education provided  Outcome: Ongoing (see interventions/notes)  Goal: Problem Interventions  Outcome: Ongoing (see interventions/notes)     Problem: Bleeding (Orthopaedic Fracture)  Goal: Absence of Bleeding  Outcome: Ongoing (see interventions/notes)     Problem: Embolism (Orthopaedic Fracture)  Goal: Absence of Embolism Signs and Symptoms  Outcome: Ongoing (see interventions/notes)     Problem: Fracture Stabilization and Management (Orthopaedic Fracture)  Goal: Fracture Stability  Outcome: Ongoing (see interventions/notes)     Problem: Functional Ability Impaired (Orthopaedic Fracture)  Goal: Optimal Functional Ability  Outcome: Ongoing (see interventions/notes)     Problem: Infection (Orthopaedic Fracture)  Goal: Absence of Infection Signs and Symptoms  Outcome: Ongoing (see interventions/notes)     Problem: Neurovascular Compromise (Orthopaedic Fracture)  Goal: Effective Tissue Perfusion  Outcome: Ongoing (see interventions/notes)     Problem: Pain (Orthopaedic Fracture)  Goal: Acceptable Pain Control  Outcome: Ongoing (see interventions/notes)     Problem: Respiratory Compromise (Orthopaedic Fracture)  Goal: Effective Oxygenation and Ventilation  Outcome: Ongoing (see interventions/notes)     Problem: ARDS (Acute Respiratory Distress Syndrome)  Goal: Effective Oxygenation  Outcome: Ongoing (see interventions/notes)     Problem: Adjustment to Injury (Multiple Trauma)  Goal: Optimal Coping with Effects of Injury  Outcome: Ongoing (see interventions/notes)     Problem: Bleeding (Multiple Trauma)  Goal: Absence of Bleeding  Outcome: Ongoing (see interventions/notes)     Problem: Cerebral Tissue Perfusion Risk (Multiple Trauma)  Goal: Effective Cerebral Tissue Perfusion  Outcome: Ongoing (see interventions/notes)     Problem: Fluid Imbalance (Multiple  Trauma)  Goal: Fluid Balance  Outcome: Ongoing (see interventions/notes)     Problem: Functional Ability Impaired (Multiple Trauma)  Goal: Optimal Functional Ability  Outcome: Ongoing (see interventions/notes)     Problem: Infection (Multiple Trauma)  Goal:  Absence of Infection Signs and Symptoms  Outcome: Ongoing (see interventions/notes)     Problem: Neurovascular Compromise (Multiple Trauma)  Goal: Effective Peripheral Tissue Perfusion  Outcome: Ongoing (see interventions/notes)     Problem: Pain (Multiple Trauma)  Goal: Acceptable Pain Control  Outcome: Ongoing (see interventions/notes)     Problem: Respiratory Compromise (Multiple Trauma)  Goal: Effective Oxygenation and Ventilation  Outcome: Ongoing (see interventions/notes)     Problem: Fluid Imbalance (Pneumonia)  Goal: Fluid Balance  Outcome: Ongoing (see interventions/notes)     Problem: Infection (Pneumonia)  Goal: Resolution of Infection Signs and Symptoms  Outcome: Ongoing (see interventions/notes)     Problem: Respiratory Compromise (Pneumonia)  Goal: Effective Oxygenation and Ventilation  Outcome: Ongoing (see interventions/notes)     Problem: Skin Injury Risk Increased  Goal: Skin Health and Integrity  Outcome: Ongoing (see interventions/notes)     Problem: Alcohol Withdrawal  Goal: Alcohol Withdrawal Symptom Control  Outcome: Ongoing (see interventions/notes)     Problem: Acute Neurologic Deterioration (Alcohol Withdrawal)  Goal: Optimal Neurologic Function  Outcome: Ongoing (see interventions/notes)     Problem: Substance Misuse (Alcohol Withdrawal)  Goal: Readiness for Change Identified  Outcome: Ongoing (see interventions/notes)

## 2021-05-14 NOTE — Nurses Notes (Signed)
PATIENT NOW SVT/AFIB WITH RVR HR 140s-160  NO BRITTON AWARE  PRN 5MG  IV METOPROLOL GIVEN  STAT EKG OBTAINED  LABS (POTASSIUM MAGNESIUM) SENT

## 2021-05-14 NOTE — Progress Notes (Signed)
Name: Hector [name] MRN:  [id number]   Date of admission: 05/01/2021 Age: 60 y.o.       Name - Hector [name]    Date of service - 05/14/2021      Interval history  No acute events overnight.  Patient remains intubated in the ICU.  He has been afebrile for the past 24 hours.  The patient's sedation was switched to ketamine and Precedex which is maxed out.  The patient has nonpurposeful but moves all extremities and is restless.  Patient was started on lactulose and his ammonia level is improving.     Physical exam  Filed Vitals:    05/14/21 0500 05/14/21 0600 05/14/21 0630 05/14/21 0700   BP: (!) 149/74 (!) 187/113 (!) 168/86 (!) 189/93   Pulse: 71 74 74 91   Resp: (!) 28 (!) 23  (!) 24   Temp: 37.2 C (99 F)      SpO2: 97% 97% 98% 97%          Intake/Output Summary (Last 24 hours) at 05/14/2021 0734  Last data filed at 05/14/2021 0700  Gross per 24 hour   Intake 4470.04 ml   Output 5115 ml   Net -644.96 ml        General:Intubated in the ICU with sedation, patient is on fentanyl, ketamine and Precedex...  He is agitated  Abdomen: soft, nontender, nondistended,small umbilical hernia  Skin: No jaundice, lesions, or rashes.  Lungs:On the ventilator   CV: Hemodynamically stable  Extremities:Patient appears to have fluid overload in all extremities      Labs/Imaging -     XR AP MOBILE CHEST performed on 05/14/2021 6:07 AM    CLINICAL HISTORY: Pleural effusions.  vent management. pleural effusion    TECHNIQUE: Frontal view of the chest.    COMPARISON:  Yesterday    FINDINGS:  ET tube 4.8 cm above the carina.. There is an NG tube extending at least into the distal esophagus and obscured over the upper abdomen. There is a left PICC catheter in the SVC   Heart size is moderately enlarged.     There are perihilar infiltrates with possible effusions obscuring the diaphragms especially on the left         IMPRESSION:  Stable infiltrates/vascular congestion with probable effusions    ASSESSMENT &  RECOMMENDATIONS:   59 y.o.malestatus post fall downstairs sustaining multiple left-sided rib fractures with subcutaneous emphysema,transverse process fracture of thoracic spine,and grade 3 splenic laceration  -continue weaning the patient from the ventilator...  Ideally I do not want the patient to undergo tracheostomy so I think at some point we need to extubate and see how he does  -the patient's mental status is still an issue however this may be a combination of alcohol withdrawal and a buildup of the sedation medications that he has been on, the patient does have a issue of cirrhosis  -antibiotics(Unasyn)for the strep pneumo pneumonia... respiratory culture is growing out some yeast and Gram-positive cocci from 05/11/2021  -continue with Lasix diuresis  -Lovenox for DVT prophylaxis  -continue tube feeding at goal  -appreciate the assistance of the ICU team    Clinton Quant, MD      Note: A portion of this documentation was generated using MMODAL (voice recognition software) and may contain syntax/voice recognition errors.

## 2021-05-14 NOTE — Care Management Notes (Signed)
05/14/21 1350   Assessment Details   Assessment Type Continued Assessment   Date of Care Management Update 05/14/21   Care Management Plan   Discharge Planning Status plan in progress   [facility] Management Note    Patient Name: Hector [name]  Date of Birth: May 20, 1961  Sex: male  Date/Time of Admission: 05/01/2021  9:05 AM  Room/Bed: 367/A  Payor: [employer] / Plan: [place] / Product Type: Medicaid [facility] /    LOS: 13 days   Primary Care Providers:  Pcp, No (General)    Admitting Diagnosis:  Multiple rib fractures [S22.49XA]    Assessment:   Patient remains in the ICU on the vent. He is on Ketamine and Precedex drips. Ammonia level was elevated, so lactulose was given. He is receiving Unasyn IV. Discharge plans are undetermined.     Discharge Plan:  Undetermined at this time    The patient will continue to be evaluated for developing discharge needs.     Case Manager: Kem Kays, RN  Phone: [phone]

## 2021-05-14 NOTE — Nurses Notes (Signed)
PATIENT CONVERTED TO SR/SA WITH HR 70s WITH PACs  AMIODARONE BOLUS HELD PER NP BRITTON

## 2021-05-15 ENCOUNTER — Ambulatory Visit (HOSPITAL_COMMUNITY): Payer: MEDICAID

## 2021-05-15 ENCOUNTER — Other Ambulatory Visit: Payer: Self-pay

## 2021-05-15 ENCOUNTER — Inpatient Hospital Stay (HOSPITAL_COMMUNITY): Payer: MEDICAID

## 2021-05-15 DIAGNOSIS — G9340 Encephalopathy, unspecified: Secondary | ICD-10-CM

## 2021-05-15 DIAGNOSIS — F1020 Alcohol dependence, uncomplicated: Secondary | ICD-10-CM

## 2021-05-15 DIAGNOSIS — E7220 Disorder of urea cycle metabolism, unspecified: Secondary | ICD-10-CM

## 2021-05-15 DIAGNOSIS — E7229 Other disorders of urea cycle metabolism: Secondary | ICD-10-CM

## 2021-05-15 HISTORY — DX: Encephalopathy, unspecified: G93.40

## 2021-05-15 HISTORY — DX: Disorder of urea cycle metabolism, unspecified: E72.20

## 2021-05-15 LAB — ARTERIAL BLOOD GAS/CO-OX
%FIO2 (ARTERIAL): 50 %
BASE EXCESS (ARTERIAL): 4.6 mmol/L — ABNORMAL HIGH (ref 0.0–1.0)
BICARBONATE (ARTERIAL): 28.5 mmol/L — ABNORMAL HIGH (ref 18.0–26.0)
CARBOXYHEMOGLOBIN: 2.1 % (ref 0.0–2.5)
HEMOGLOBIN: 9.5 g/dL — ABNORMAL LOW (ref 12.0–18.0)
MET-HEMOGLOBIN: 0.2 % (ref 0.0–2.0)
OXYHEMOGLOBIN: 93.4 % (ref 85.0–98.0)
PAO2/FIO2 RATIO: 136 (ref <=200)
PCO2 (ARTERIAL): 37 mm/Hg (ref 35–45)
PH (ARTERIAL): 7.49 — ABNORMAL HIGH (ref 7.35–7.45)
PO2 (ARTERIAL): 68 mm/Hg — ABNORMAL LOW (ref 72–100)

## 2021-05-15 LAB — ARTERIAL BLOOD GAS, CO-OX, LYTES, LACTATE REFLEX
%FIO2 (ARTERIAL): 60 %
(T) PCO2: 36 mm/Hg (ref 35.0–45.0)
(T) PO2: 103 mm/Hg — ABNORMAL HIGH (ref 72.0–100.0)
BASE EXCESS (ARTERIAL): 4.8 mmol/L — ABNORMAL HIGH (ref 0.0–1.0)
BICARBONATE (ARTERIAL): 28.7 mmol/L — ABNORMAL HIGH (ref 18.0–26.0)
CARBOXYHEMOGLOBIN: 1.8 % (ref 0.0–2.5)
CHLORIDE: 113 mmol/L — ABNORMAL HIGH (ref 101–111)
GLUCOSE: 120 mg/dL — ABNORMAL HIGH (ref 60–105)
HEMATOCRITRT: 36 %
HEMOGLOBIN: 12 g/dL (ref 12.0–18.0)
IONIZED CALCIUM: 1.25 mmol/L (ref 1.10–1.35)
LACTATE: 1 mmol/L (ref 0.0–1.3)
MET-HEMOGLOBIN: 0.8 % (ref 0.0–2.0)
O2CT: 16.4 % (ref 15.7–24.3)
OXYHEMOGLOBIN: 96.4 % (ref 85.0–98.0)
PAO2/FIO2 RATIO: 172 (ref <=200)
PCO2 (ARTERIAL): 36 mm/Hg (ref 35–45)
PH (ARTERIAL): 7.5 — ABNORMAL HIGH (ref 7.35–7.45)
PH (T): 7.5 — ABNORMAL HIGH (ref 7.35–7.45)
PO2 (ARTERIAL): 103 mm/Hg — ABNORMAL HIGH (ref 72–100)
SODIUM: 147 mmol/L — ABNORMAL HIGH (ref 137–145)
TEMPERATURE, COMP: 37 C
WHOLE BLOOD POTASSIUM: 3.9 mmol/L (ref 3.5–4.6)

## 2021-05-15 LAB — BASIC METABOLIC PANEL
ANION GAP: 7 mmol/L (ref 5–19)
BUN/CREA RATIO: 54 — ABNORMAL HIGH (ref 6–20)
BUN: 37 mg/dL — ABNORMAL HIGH (ref 9–20)
CALCIUM: 9 mg/dL (ref 8.4–10.2)
CHLORIDE: 114 mmol/L — ABNORMAL HIGH (ref 98–107)
CO2 TOTAL: 29 mmol/L (ref 22–30)
CREATININE: 0.68 mg/dL (ref 0.66–1.20)
ESTIMATED GFR: >60 mL/min/{1.73_m2} (ref >60)
GLUCOSE: 125 mg/dL — ABNORMAL HIGH (ref 74–106)
POTASSIUM: 4 mmol/L (ref 3.5–5.1)
SODIUM: 150 mmol/L — ABNORMAL HIGH (ref 137–145)

## 2021-05-15 LAB — CBC
HCT: 30.4 % — ABNORMAL LOW (ref 36.0–46.0)
HGB: 10.1 g/dL — ABNORMAL LOW (ref 13.9–16.3)
MCH: 34.2 pg — ABNORMAL HIGH (ref 25.4–34.0)
MCHC: 33.2 g/dL (ref 30.0–37.0)
MCV: 103 fL — ABNORMAL HIGH (ref 80.0–100.0)
MPV: 11.6 fL — ABNORMAL HIGH (ref 7.5–11.5)
PLATELETS: 95 10*3/uL — ABNORMAL LOW (ref 130–400)
RBC: 2.95 10*6/uL — ABNORMAL LOW (ref 4.30–5.90)
RDW: 14.9 % — ABNORMAL HIGH (ref 11.5–14.0)
WBC: 8.3 10*3/uL (ref 4.5–11.5)

## 2021-05-15 LAB — MAGNESIUM: MAGNESIUM: 2.5 mg/dL — ABNORMAL HIGH (ref 1.6–2.3)

## 2021-05-15 LAB — PHOSPHORUS: PHOSPHORUS: 4.1 mg/dL (ref 2.5–4.5)

## 2021-05-15 LAB — AMMONIA: AMMONIA: 78 umol/L — ABNORMAL HIGH (ref 9–30)

## 2021-05-15 LAB — POTASSIUM: POTASSIUM: 4.1 mmol/L (ref 3.5–5.1)

## 2021-05-15 MED ORDER — TRAZODONE 50 MG TABLET
50.0000 mg | ORAL_TABLET | Freq: Every evening | ORAL | Status: DC
Start: 2021-05-15 — End: 2021-05-30
  Administered 2021-05-15 – 2021-05-19 (×5): 50 mg via ORAL
  Administered 2021-05-20: 0 mg via ORAL
  Administered 2021-05-21 – 2021-05-28 (×8): 50 mg via ORAL
  Filled 2021-05-15 (×13): qty 1

## 2021-05-15 MED ORDER — LACTULOSE 20 GRAM/30 ML ORAL SOLUTION
45.0000 mL | Freq: Four times a day (QID) | ORAL | Status: DC
Start: 2021-05-15 — End: 2021-05-16
  Administered 2021-05-15 – 2021-05-16 (×3): 45 mL via ORAL
  Filled 2021-05-15 (×3): qty 60

## 2021-05-15 MED ORDER — GADOTERATE MEGLUMINE 0.5 MMOL/ML (376.9 MG/ML) INTRAVENOUS SOLUTION
20.0000 mL | INTRAVENOUS | Status: AC
Start: 2021-05-15 — End: 2021-05-15
  Administered 2021-05-15: 20 mL via INTRAVENOUS

## 2021-05-15 MED ORDER — THIAMINE HCL (VITAMIN B1) 100 MG/ML INJECTION SOLUTION
500.0000 mg | Freq: Three times a day (TID) | INTRAVENOUS | Status: AC
Start: 2021-05-15 — End: 2021-05-18
  Administered 2021-05-15: 500 mg via INTRAVENOUS
  Administered 2021-05-15: 0 mg via INTRAVENOUS
  Administered 2021-05-16 (×2): 500 mg via INTRAVENOUS
  Administered 2021-05-16: 0 mg via INTRAVENOUS
  Administered 2021-05-16: 500 mg via INTRAVENOUS
  Administered 2021-05-16 – 2021-05-17 (×4): 0 mg via INTRAVENOUS
  Administered 2021-05-17 (×3): 500 mg via INTRAVENOUS
  Administered 2021-05-17 – 2021-05-18 (×2): 0 mg via INTRAVENOUS
  Administered 2021-05-18 (×2): 500 mg via INTRAVENOUS
  Administered 2021-05-18: 0 mg via INTRAVENOUS
  Filled 2021-05-15 (×9): qty 5

## 2021-05-15 MED ORDER — THIAMINE HCL (VITAMIN B1) 100 MG/ML INJECTION SOLUTION
100.0000 mg | Freq: Two times a day (BID) | INTRAVENOUS | Status: DC
Start: 2021-05-15 — End: 2021-05-15
  Administered 2021-05-15: 0 mg via INTRAVENOUS
  Administered 2021-05-15: 100 mg via INTRAVENOUS
  Filled 2021-05-15 (×2): qty 1

## 2021-05-15 MED ORDER — SODIUM CHLORIDE 0.9 % INTRAVENOUS SOLUTION
10.0000 mg | Freq: Four times a day (QID) | INTRAVENOUS | Status: DC
Start: 2021-05-15 — End: 2021-05-15
  Filled 2021-05-15 (×3): qty 2

## 2021-05-15 MED ORDER — MIDAZOLAM 1 MG/ML INJECTION WRAPPER
2.0000 mg | Freq: Once | INTRAMUSCULAR | Status: DC
Start: 2021-05-15 — End: 2021-05-19
  Administered 2021-05-15: 0 mg via INTRAVENOUS
  Filled 2021-05-15: qty 2

## 2021-05-15 MED ORDER — FENTANYL (PF) 50 MCG/ML INJECTION SOLUTION
50.0000 ug | INTRAMUSCULAR | Status: DC | PRN
Start: 2021-05-15 — End: 2021-05-30
  Administered 2021-05-15: 50 ug via INTRAVENOUS
  Filled 2021-05-15: qty 2

## 2021-05-15 MED ORDER — SERTRALINE 100 MG TABLET
100.0000 mg | ORAL_TABLET | Freq: Every evening | ORAL | Status: DC
Start: 2021-05-15 — End: 2021-05-30
  Administered 2021-05-15 – 2021-05-19 (×6): 100 mg via ORAL
  Administered 2021-05-20: 0 mg via ORAL
  Administered 2021-05-21 – 2021-05-28 (×8): 100 mg via ORAL
  Filled 2021-05-15 (×13): qty 1

## 2021-05-15 MED ORDER — VECURONIUM BROMIDE 10 MG INTRAVENOUS SOLUTION
10.0000 mg | Freq: Once | INTRAVENOUS | Status: DC
Start: 2021-05-15 — End: 2021-05-16
  Administered 2021-05-15: 0 mg via INTRAVENOUS
  Filled 2021-05-15: qty 10

## 2021-05-15 MED ORDER — METOCLOPRAMIDE 5 MG/ML INJECTION SOLUTION
10.0000 mg | Freq: Four times a day (QID) | INTRAMUSCULAR | Status: DC
Start: 2021-05-15 — End: 2021-05-30
  Administered 2021-05-15 – 2021-05-26 (×45): 10 mg via INTRAVENOUS
  Administered 2021-05-27: 0 mg via INTRAVENOUS
  Administered 2021-05-27 – 2021-05-28 (×5): 10 mg via INTRAVENOUS
  Administered 2021-05-28: 0 mg via INTRAVENOUS
  Administered 2021-05-28 – 2021-05-29 (×4): 10 mg via INTRAVENOUS
  Administered 2021-05-29: 0 mg via INTRAVENOUS
  Filled 2021-05-15 (×48): qty 2

## 2021-05-15 MED ORDER — THIAMINE HCL (VITAMIN B1) 100 MG/ML INJECTION SOLUTION
100.0000 mg | Freq: Every day | INTRAMUSCULAR | Status: DC
Start: 2021-05-18 — End: 2021-05-30
  Administered 2021-05-18: 0 mg via INTRAMUSCULAR
  Administered 2021-05-19 – 2021-05-24 (×6): 100 mg via INTRAMUSCULAR
  Administered 2021-05-25: 0 mg via INTRAMUSCULAR
  Administered 2021-05-26 – 2021-05-29 (×4): 100 mg via INTRAMUSCULAR
  Filled 2021-05-15 (×10): qty 2

## 2021-05-15 NOTE — Consults (Signed)
[facility]    Neurology Consult Note  05/15/2021       Name:  Hector [name], Hector [name]  Age: 60 y.o.  Gender: male  Date of Admission:  05/01/2021  Date of Birth:  [birth year]    Code Status:        PCP: No Pcp  Consulting physician: Clinton Quant, MD    Chief Complaint:  Encephalopathy  Reason for the consult:  Encephalopathy  History obtained from:  Chart review      CNO:BSJGG Hector [name] is a 60 y.o., White male with history of significant alcohol abuse or presented to the hospital as a trauma when he fell approximately 11 stairs.  He was found to have a small left pneumothorax along with multiple displaced rib fractures and splenic trauma.  The patient does have a history alcoholic cirrhosis with evidence portal hypertension at baseline.  Based on documentation, it appears that the patient drinks about 15 beers and 2 4  lokos daily.  He was placed on a phenobarb taper which she seems to have successfully completed.  The patient has been in the hospital since the 18th of May and has been intubated and sedated.  The patient seems to have intermittent episodes of restlessness and agitation even on sedation and has been difficult to be weaned off sedation.  He has been somewhat inconsistent with his neurological exam with no purposeful movements.  Today, the patient was agitated and he was placed back on Precedex and ketamine.  Of note he has been on ketamine, Precedex and fentanyl.  An ammonia level was drawn and it was at 78 and his lactulose dosing has been increased.              Review of Systems:  Unable to be obtained as the patient is on sedation and is intubated  Past Medical History:  Past Medical History:   Diagnosis Date    Alcohol abuse     Cirrhosis (CMS HCC)     Delirium, withdrawal, alcoholic (CMS HCC)     HTN (hypertension)     Unknown cause of injury     fx nose    Wears glasses          Past Surgical History:  Past Surgical History:   Procedure Laterality Date    HX TONSILLECTOMY         Past  Social Hisory:  Social History     Socioeconomic History    Marital status: Divorced   Tobacco Use    Smoking status: Current Every Day Smoker     Packs/day: 1.50    Smokeless tobacco: Never Used   Brewing technologist Use: Never used   Substance and Sexual Activity    Alcohol use: Yes     Alcohol/week: 15.0 standard drinks     Types: 15 Cans of beer per week     Comment: daily    Drug use: Yes     Types: Marijuana     Comment: daily    Sexual activity: Yes        Past Family History:  Family Medical History:     Problem Relation (Age of Onset)    Asthma Father          Allergies:  Allergies   Allergen Reactions    Bee Venom Protein (Honey Bee)      Bee stings        Medications:    amiodarone (NEXTERONE) 150 mg in  D5W 135mL premix IV bolus 150 mg Once    chlorhexidine gluconate (PERIDEX) 0.12% mouthwash 15 mL 2x/day    docusate sodium (COLACE) 10mg  per mL oral liquid 100 mg 2x/day    enoxaparin PF (LOVENOX) 40 mg/0.4 mL SubQ injection 40 mg Daily    folic acid (FOLATE) 5 mg/mL injection 1 mg Daily    furosemide (LASIX) 10 mg/mL injection 40 mg Q8HRS    haemophilus B conj-tetanus toxoid (ACTHIB) injection 0.5 mL Once    ipratropium-albuterol 0.5 mg-3 mg(2.5 mg base)/3 mL Solution for Nebulization 3 mL Q4H    lactulose (ENULOSE) 20g per 5mL oral liquid 30 mL Q6H    lidocaine (LIDODERM) 5% patch 1 Patch Daily    meningococcal conjugate (PF) vaccine (MENVEO) IM injection 0.5 mL Once    meningococcal group B vaccine (BEXSERO) IM injection 0.5 mL Once    multivitamin (THERA) tablet 1 Tablet Daily    NS flush syringe 10 mL Q8HRS    pantoprazole (PROTONIX) injection 40 mg Q12H **AND** NS flush syringe 10 mL Daily    nystatin (NYSTOP) 100,000 units/g topical powder  2x/day    OLANZapine (ZYPREXA) tablet 10 mg Daily    pneumococcal 13-valent conjugate vaccine (PREVNAR 13) IM injection 0.5 mL Once    potassium chloride (K-DUR) extended release tablet 20 mEq Q8H    propofoL (DIPRIVAN) 10 mg/mL  premix infusion ---Cabinet Override      sennosides (SENNA) tablet 8.6 mg 2x/day    thiamine (VITAMIN B1) 100 mg in NS 50 mL IVPB 100 mg Q12H      Vital Signs:  Filed Vitals:    05/15/21 1030 05/15/21 1031 05/15/21 1045 05/15/21 1100   BP: 115/62  (!) 91/55 (!) 100/58   Pulse: 72  63 63   Resp:       Temp:       SpO2: 98% 99% 98% 99%        Physical exam:   General: appears in good health  HENT:Head atraumatic and normocephalic  NExtremities: No cyanosis or edema  Mental status:  Level of Consciousness: unresponsive to painful stimuli  Orientations: Disoriented to time, Disoriented to  place and Disoriented to person  Memory: Follows No commands   AttentionsAttention decreased and Concentration decreased  Knowledge: Poor  Language:  Intubated and sedated  Speech: mute  Cranial nerves:   CN2: Conscensual response: Normal  CN 3,4,6: EOMI, PERRLA  CN 5Corneal reflex: Normal  CN 7Face symmetrical  CN 8: UTA , intubated and sedated  CN 9,10: Gag reflex: normal  CN 11: intubaeted and sedated  CN 12: Unabel to visualize, as she is intubated and sedated.   Gait, Coordination, and Reflexes:   Gait: No tremors seen   Coordination: No tremors seen     Muscle tone: flaccid  Muscle exam-Does not move any extremities even to noxious stimuli ( patient sedated )    Reflexes   RJ BJ TJ KJ AJ Plantars Hoffman's   Right 2+ 2+ 2+ 2+ 2+ Upgoing Not present   Left 2+ 2+ 2+ 2+ 2+ Upgoing Not present     Sensory: No withdrawal to pain in any extremity Normal color, texture and turgor without significant lesions or rashes  Diabetes Monitors: Patient not a diabetic.        Labs: I personally reviewed all the lab results.   Ammonia 78    Neuroimaging: I personally reviewed the images and interpretated the results.    CT brain does not show any evidence  of acute intracranial process.    Assessment and Plan :  1.Encephalopathy  2. History of chronic alcoholism    This is a 60 year old male who presents to  hospital after he fell down the stairs intoxicated.  The patient has a longstanding history of alcoholic cirrhosis and portal hypertension.  He was intubated and sedated and it has been difficult to wean him off sedation to to periods of intermittent agitation.  Today, the patient was on Precedex and ketamine and it was not possible to obtain an accurate neurological exam .  I suspect the patient's encephalopathy to be due to a combination of toxic and metabolic etiology.  The patient has a history of alcoholic cirrhosis and his ammonia level was elevated at 78 today.  Moreover he has also been on multiple sedating medications for the past 2 weeks which could affecting his mental status.  Unclear if there is a component of Wernicke's encephalopathy as well.  Will recommend increasing the dose of thiamine to 500 mg t.i.d. for 3 days.  Also recommend restarting the patient's home medications like the Zoloft and trazodone.  EEG was performed today which did not show any evidence of epileptiform discharges.  Will recommend obtaining an MRI brain as well to rule out any other intracranial etiology that could be contributing towards the patient's encephalopathy.        Hector [name]      This note was partially generated using MModal Fluency Direct system, and there may be some incorrect words, spellings, and punctuation.

## 2021-05-15 NOTE — Progress Notes (Addendum)
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 (e.g., Automated exposure control, adjustment of the mA and/or kV according to patient size, use of iterative reconstruction technique).      Radiologist location ID: [id number]     XR AP MOBILE CHEST     Status: None    Narrative    Hector [name]    RADIOLOGIST: Betsey Amen, MD    XR AP MOBILE CHEST performed on 05/04/2021 7:03 AM    CLINICAL HISTORY: Trauma.  resp fail-vent    TECHNIQUE: Frontal view of the chest.    COMPARISON:  05/03/2021    FINDINGS:  Endotracheal tube in place whose tip is 3.9 cm from the carina. Nasogastric tube in place terminating over the stomach. Left arm PICC in place whose tip is in the lower SVC.   Heart size is moderately enlarged.     Small bilateral pleural effusions similar prior exam. Bibasilar atelectasis/pneumonia. No pneumothorax nor vascular congestion. No significant change.           Impression    No significant change.      Radiologist location ID: [id number]     XR AP MOBILE CHEST     Status: None    Narrative    Hector [name]    RADIOLOGIST: Diana Eves, MD    XR AP MOBILE CHEST performed on 05/05/2021 6:31 AM    CLINICAL HISTORY: Trauma.  trauma/vent    TECHNIQUE: Frontal view of the chest.    COMPARISON:  Yesterday    FINDINGS:  Support tubes and lines are unchanged.   There is stable mild enlargement of the  cardiac  silhouette. The pulmonary vasculature is within normal limits.   There are persistent small bilateral pleural effusions with associated bibasilar atelectasis/infiltrate. These findings have improved compared to prior study.           Impression    As above      Radiologist location ID: [id number]     XR AP MOBILE CHEST     Status: None    Narrative    Spiros LEE Nims    RADIOLOGIST: Ileene Hutchinson    XR AP MOBILE CHEST performed on 05/05/2021 1:14 PM    CLINICAL HISTORY: Post Bronchoscopy.  vent     TECHNIQUE: Frontal view of the chest.    COMPARISON:  Chest radiograph dated 05/05/2018    FINDINGS:  The endotracheal tube terminates 5 cm above the carina. An enteric tube courses into the stomach. A left upper extremity PICC terminates near the superior cavoatrial junction.  The heart size is normal.   Lung volumes are low with hazy bibasilar airspace opacities. There is no pneumothorax.   The bones are unremarkable.        Impression    1.Bibasilar atelectasis or pneumonia/aspiration  2.No pneumothorax  3.Endotracheal tube terminating 5 cm above the carina      Radiologist location ID: [id number]     XR AP MOBILE CHEST     Status: None    Narrative    Ryheem LEE Toves    RADIOLOGIST: Berton Bon, MD    XR AP MOBILE CHEST performed on 05/06/2021 6:32 AM    CLINICAL HISTORY: Trauma.  trauma/vent/resp failure    TECHNIQUE: Frontal view of the chest.    COMPARISON:  Yesterday    FINDINGS:  ET tube 3 cm above the carina. There is an NG tube in the stomach. There is a left PICC catheter in the SVC.   Heart size is moderately enlarged.     There are bilateral perihilar infiltrates   Mild right dorsal scoliosis        Impression    Persistent perihilar infiltrates/vascular congestion slightly worse on the left since yesterday      Radiologist location ID: [id number]     XR AP MOBILE CHEST     Status: None    Narrative    Abass LEE Desjardin    RADIOLOGIST: Dorisann Frames, MD    XR AP MOBILE CHEST performed on 05/07/2021  6:11 AM    CLINICAL HISTORY: Trauma.  on vent     TECHNIQUE: Frontal view of the chest.    COMPARISON:  Yesterday    FINDINGS:  The support lines and tubes are in stable and satisfactory position.   Cardiac and mediastinal contours are stable.   No significant change in the appearance of the lungs.   Extensive bilateral pulmonary infiltrates persist with a moderate size left pleural effusion.        Impression    NO SIGNIFICANT CHANGE SINCE THE PRIOR EXAM.      Radiologist location ID: [id number]     [place] CHEST (EFFUSION)     Status: None    Narrative    Everett LEE Mangan    RADIOLOGIST: Colletta Maryland, MD    [place] CHEST (EFFUSION) performed on 05/07/2021 11:17 AM    CLINICAL HISTORY: pleural effusions..  check left pleural effusion    TECHNIQUE:  Ultrasound imaging of left chest.    COMPARISON:  None.    FINDINGS:  There is a moderate-sized left pleural effusion.  Impression    LEFT PLEURAL EFFUSION        Radiologist location ID: [id number]     US THORACENTESIS     Status: None    Narrative    *Procedure not read by radiology.    *Please Refer to Procedure Note for result.   XR AP MOBILE CHEST     Status: None    Narrative    Junior LEE Mayon    RADIOLOGIST: Berton Bon, MD    XR AP MOBILE CHEST performed on 05/07/2021 4:40 PM    CLINICAL HISTORY: POST THORACENTESIS.  s/p left thoracentisis.     TECHNIQUE: Frontal view of the chest.    COMPARISON:  Previous exam from today    FINDINGS:  ET tube 5 cm above the carina. There is an NG tube in the stomach. There is a left PICC catheter in the SVC   Heart size is mildly enlarged.     There are perihilar and basilar infiltrates. Possible small effusions obscuring the diaphragms   There are multiple left lateral rib fracture deformities        Impression    Negative for pneumothorax after thoracentesis      Radiologist location ID: [id number]     XR AP MOBILE CHEST     Status: None    Narrative    Declan LEE Privette    RADIOLOGIST: Otho Najjar, MD    XR AP MOBILE CHEST  performed on 05/09/2021 6:15 AM    CLINICAL HISTORY: Mult. L rib fx.s, effusion, intubated.  resp fail-vent, effusion, multiple rib fx's    TECHNIQUE: Frontal view of the chest.    COMPARISON:  05/07/2021    FINDINGS:  The support lines and tubes are in stable and satisfactory position. No pneumothorax.     There is increasing opacity bilaterally in the mid lung fields with infiltrate, effusion and vascular congestion. This could be inflammatory but the symmetry suggests development of pulmonary edema. Clinical correlation recommended.      Impression    Increasing opacity bilaterally suggesting increasing CHF. Clinical correlation recommended.      Radiologist location ID: [id number]     XR AP MOBILE CHEST     Status: None    Narrative    Larone LEE Eschete    RADIOLOGIST: Colletta Maryland, MD    XR AP MOBILE CHEST performed on 05/10/2021 6:19 AM    CLINICAL HISTORY: respiratory failure.  resp fail-vent    TECHNIQUE: Frontal view of the chest.    COMPARISON:  05/09/2021    FINDINGS:  The support lines and tubes are in stable and satisfactory position.   Cardiac and mediastinal contours are stable.   Airspace disease is present in the mid to lower lungs with probable bilateral pleural effusions. Lung volumes are slightly improved since yesterday           Impression    BILATERAL AIRSPACE DISEASE AND PLEURAL EFFUSIONS WITH SOME IMPROVEMENT SINCE YESTERDAY      Radiologist location ID: [id number]     CT ANGIO CHEST FOR PULMONARY EMBOLUS W IV CONTRAST     Status: None    Narrative    Tytan LEE Simmonds    RADIOLOGIST: Colletta Maryland, MD    CT ANGIO CHEST FOR PULMONARY EMBOLUS W IV CONTRAST performed on 05/10/2021 11:49 AM    CLINICAL HISTORY: hypoxia.  on the Vent , increased hypoxia. CXR showed increased opacities  Increase in CHF  Lung sounds coarse  .  fall on 05-01-21    TECHNIQUE: CTA imaging of the chest with intravenous contrast.  3D reconstructions.  CONTRAST:  100 ml's of Isovue 370    COMPARISON: 05/03/2021  # of known CTs in  the past 12 months: 7   # of known Cardiac Nuclear Medicine Studies in the past 12 months: 0         FINDINGS:  Hardware:  There is an NG tube in the stomach. An ET tube is present in the trachea. There is a left-sided PICC line in the SVC.    Lymph nodes:   No mediastinal, hilar, or axillary lymphadenopathy.    Heart:  Coronary artery calcifications are noted.        RV/LV Diameter Ratio: N/A    Thoracic Aorta:  No thoracic aortic aneurysm or dissection.    Pulmonary Vessels:  No evidence of acute pulmonary emboli through the major subsegmental branches.    Lungs and Airways:  There is patient respiratory motion artifact which limits evaluation of the lungs.      Bilateral lower lobe airspace disease with air bronchograms. Pneumonia versus atelectasis.      Pleura: Bilateral pleural effusions left larger than right    Upper Abdomen: Cirrhosis with splenomegaly. Small volume of ascites. Probable cholelithiasis.    Bones: Bone windows are unremarkable.        Impression    1.NO PULMONARY EMBOLUS  2.BILATERAL PLEURAL EFFUSIONS  3.BILATERAL LOWER LOBE AIRSPACE DISEASE. PNEUMONIA VERSUS ATELECTASIS      One or more dose reduction techniques were used (e.g., Automated exposure control, adjustment of the mA and/or kV according to patient size, use of iterative reconstruction technique).      Radiologist location ID: [id number]     XR AP MOBILE CHEST     Status: None    Narrative    Drevin LEE Enright    RADIOLOGIST: Colletta Maryland, MD    XR AP MOBILE CHEST performed on 05/11/2021 5:35 AM    CLINICAL HISTORY: respiratory failure.  resp fail-vent    TECHNIQUE: Frontal view of the chest.    COMPARISON:  Yesterday    FINDINGS:  The support lines and tubes are in stable and satisfactory position.   Cardiac and mediastinal contours are stable.   Bilateral airspace disease with bilateral pleural effusions similar to yesterday           Impression    NO ACUTE FINDINGS.      Radiologist location ID: [id number]     CT BRAIN WO IV  CONTRAST     Status: None    Narrative    Olive LEE Kittell    RADIOLOGIST: Colletta Maryland, MD    CT BRAIN WO IV CONTRAST performed on 05/12/2021 2:27 AM    CLINICAL HISTORY: altered mental status.  Pt intubated and per RN very restless this early AM    TECHNIQUE:  Head CT without intravenous contrast.    COMPARISON: 05/01/2021  # of known CTs in the past 12 months: 8   # of known Cardiac Nuclear Medicine Studies in the past 12 months: 0    FINDINGS: This study is degraded by patient motion artifact  There is no acute intracranial hemorrhage, mass effect, or evidence of large acute infarct.    Brain: Low density in the periventricular white matter suggests mild chronic small vessel ischemic changes.    CSF Spaces: Mild generalized cerebral atrophy     Sinuses/Mastoids:  Clear at visualized levels  Bones: Unremarkable        Impression    CHRONIC CHANGES.  NO ACUTE FINDINGS.       One or more dose reduction techniques were used (e.g., Automated exposure control, adjustment of the mA and/or kV according to patient size, use of iterative reconstruction technique).      Radiologist location ID: [id number]     XR AP MOBILE CHEST     Status: None    Narrative    Pinkney LEE Bayer    RADIOLOGIST: Colletta Maryland, MD    XR AP MOBILE CHEST performed on 05/12/2021 6:20 AM    CLINICAL HISTORY: respiratory failure.  vent management     TECHNIQUE: Frontal view of the chest.    COMPARISON:  Yesterday    FINDINGS:  The support lines and tubes are in stable and satisfactory position.   Cardiac and mediastinal contours are stable.   There is a left pleural effusion and bilateral airspace disease with possible right pleural effusion. Mild improvement in the right lung since yesterday.           Impression    BILATERAL AIRSPACE DISEASE AND PLEURAL EFFUSIONS LEFT WORSE      Radiologist location ID: [id number]     XR AP MOBILE CHEST     Status: None    Narrative    Junior LEE Newbold    RADIOLOGIST: Colletta Maryland, MD    XR AP MOBILE CHEST  performed on 05/13/2021 8:23 AM    CLINICAL HISTORY: respiratory failure, PNA.  resp.failure, PNA, vent    TECHNIQUE: Frontal view of the chest.    COMPARISON:  Yesterday    FINDINGS:  The support lines and tubes are in stable and satisfactory position.   Cardiac and mediastinal contours are stable.   There is a left pleural effusion unchanged from yesterday. The right lung is improved since yesterday with improved lung volumes.           Impression    PERSISTENT LEFT PLEURAL EFFUSION      Radiologist location ID: [id number]     XR AP MOBILE CHEST     Status: None    Narrative    Thaniel LEE Desa    RADIOLOGIST: Berton Bon, MD    XR AP MOBILE CHEST performed on 05/14/2021 6:07 AM    CLINICAL HISTORY: Pleural effusions.  vent management. pleural effusion    TECHNIQUE: Frontal view of the chest.    COMPARISON:  Yesterday    FINDINGS:  ET tube 4.8 cm above the carina.. There is an NG tube extending at least into the distal esophagus and obscured over the upper abdomen. There is a left PICC catheter in the SVC   Heart size is moderately enlarged.     There are perihilar infiltrates with possible effusions obscuring the diaphragms especially on the left           Impression    Stable infiltrates/vascular congestion with probable effusions      Radiologist location ID: [id number]     XR AP MOBILE CHEST     Status: None    Narrative    Obe LEE Cutting    RADIOLOGIST: Brayton El, MD    XR AP MOBILE CHEST performed on 05/15/2021 6:04 AM    CLINICAL HISTORY: Pleural effusions.      vent management     TECHNIQUE: Frontal view of the chest.    COMPARISON:  05/13/2021    FINDINGS:  A left  PICC ends in the upper SVC. The endotracheal tube ends 4 cm above the carina. The enteric tube ends below the diaphragm.     The heart size is normal.     There is improving aeration of both lungs most notably at both lung bases. A trace left pleural effusion persists.         Impression    Improving aeration of both lungs. The left  pleural effusion appears to have decreased in size.       Radiologist location ID: [id number]     XR ABD SUPINE     Status: None    Narrative    Kamarii LEE Bera    RADIOLOGIST: Brayton El, MD    XR ABD SUPINE performed on 05/15/2021 1:02 PM.    CLINICAL HISTORY: r/o ileus - abdominal distention.      ileus    TECHNIQUE: Single view abdomen.    COMPARISON:  None.      FINDINGS:  There is a diffuse gaseous distention of the small bowel. There is also gas within the cecum suggesting this could be an ileus.     No suspicious calcifications.    The bones are unremarkable.        Impression    Diffuse gaseous distention in a pattern suggestive of ileus. The enteric tube ends in the stomach.        Radiologist location ID: [id number]        All current radiology studies were reviewed.    PHYSICAL EXAMINATION  Temperature: 37.6 C (99.6 F)  Heart Rate: 74  BP (Non-Invasive): 132/72  Respiratory Rate: (!) 32  SpO2: 95 %    General:  At the time I visit he is relatively calm  on the ketamine and Precedex.  SKIN:  Warm and dry.  HEENT:  Normocephalic.  Sclera nonicteric.  Pupils are reactive.  Oral endotracheal tube and orogastric tube in place    Neck:  The swelling    Heart:  S1, S2.  No murmurs.    Lungs:  Bilateral rhonchi.  He has decreased at the left base and this is the side where he had the pleural effusion drained.    Abdomen:  Hypoactive bowel sounds.  Abdomen is somewhat distended.  X-ray pending.    Extremities:  No significant edema, cyanosis or clubbing.    Neuro:  Sedate but he does open his eyes spontaneously during the exam.      VENTILATOR/SETTINGS:       OET= Size 7.5 mm; taped at 26 cm       Mode:  Assist-control       Tidal Volume:  500       Ventilator Rate:  22;  total respiratory rate was 29.       FI02:  60%;   oxygen was decreased down to 50%           PEEP:  10 cm       Peak air pressure 19 cm.  Mean airway pressure 11 cm.       Gastric Tube:  Orogastric     Tube Feedings:  Vital HP at  20 cc/hour.    PICC LINE:  Right upper arm    INTRAVENOUS INFUSIONS:1.  Precedex at 1  2.  Ketamine at 61m    FOLEY CATHETER:  In place     PROBLEM LIST:   Active Hospital Problems   (*Primary Problem)    Diagnosis   . *Spleen injury   . Encephalopathy   . Pleural effusion   . On mechanically assisted ventilation (CMS HCC)   . Multiple rib fractures   . Left pulmonary contusion   . Closed fracture of transverse process of thoracic vertebra (CMS HCC)   . 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 Thrombocytopenia (CMS HCC)   . Alcohol abuse        PLAN  1.  EEG was performed.   MRI is pending.  Neurology consult was reviewed.  2.  Abdominal film.  Tube feedings at 20 cc/hour.  3.  GI prophylaxis and DVT prophylaxis.  4   diuretics are on hold because of hypernatremia.  5.  Daily labs, ABGs and chest x-ray.  6.  Thiamine was increased to 100 mg t.i.d. x3 days.  7.   Lactulose was increased to 45 cc every 6 hours;  ammonia level is elevated.      Critical care time is 45 minutes  __________________________________  RCorky Crafts M.D.      Board Certified in Internal Medicine, Pulmonary, and Sleep Medicine

## 2021-05-15 NOTE — Care Plan (Signed)
Will continue to assess and monitor pt.  Will follow current POC and update as needed.

## 2021-05-15 NOTE — Care Management Notes (Signed)
05/15/21 1139   Assessment Details   Assessment Type Continued Assessment   Date of Care Management Update 05/15/21   Care Management Plan   Discharge Planning Status plan in progress   [place] Management Note    Patient Name: Hector [name]  Date of Birth: [birth year]  Sex: male  Date/Time of Admission: 05/01/2021  9:05 AM  Room/Bed: 367/A  Payor: [place] / Plan: [place] / Product Type: Medicaid [place] /    LOS: 14 days   Primary Care Providers:  Pcp, No (General)    Admitting Diagnosis:  Multiple rib fractures [S22.49XA]    Assessment:   Patient remains in the ICU on the ventilator with sedation. Discharge plans undetermined.     Discharge Plan:  Undetermined at this time      The patient will continue to be evaluated for developing discharge needs.     Case Manager: Kem Kays, RN  Phone: [phone]

## 2021-05-15 NOTE — Nurses Notes (Signed)
NP BRIGHT CONTACTED REGARDING LABORED RESPIRATIONS-RR 30s; HR 100s-110s; ABDOMINAL TUGGING; PEAK AIRWAY PRESSURES 30s  AWAITING ORDERS

## 2021-05-15 NOTE — OT Treatment (Signed)
[facility] [facility]  Occupational Therapy Progress Note    Patient Name: Hector [name]  Date of Birth: 02/01/1961  Height:  182.9 cm (6')  Weight:  105 kg (232 lb 2.3 oz)  Room/Bed: 367/A  Payor: [employer] / Plan: [employer] MEDICAID / Product Type: Medicaid [place] /     Assessment:       05/15/21 0933   Daily Activity AM-PAC/6-clicks Score   Putting on/Taking off clothing on lower body 1   Bathing 1   Toileting 1   Putting on/Taking off clothing on upper body 1   Personal grooming 1   Eating Meals 1   Raw Score Total 6   Standardized (t-scale) Score 17.07   CMS 0-100% Score 100   CMS Modifier CN         Discharge Needs:   Equipment Recommendation: to be determined    Discharge Disposition:  LTACH    Plan:   Continue to follow patient according to established plan of care.  The risks/benefits of therapy have been discussed with the patient/caregiver and he/she is in agreement with the established plan of care.     Subjective & Objective:     Sedated/intubated/vent.  Patient did not arouse or follow commands this am.  OT provided orientation information and PROM bilateral upper extremities within available ranges.  Tolerated without signs of distress/discomfort.  Plan:  will follow and progress as indicated and tolerated.      Pain:  no pain reported      Patient education orientation provided            Therapist:   Landry Corporal, OT     Start Time: [phone]  End Time: 3419  Total Treatment Time: 9 minutes  Charges Entered: TEx1  Department Number: [phone]

## 2021-05-15 NOTE — PT Treatment (Signed)
[facility] [place], [place] [address]  [phone]         Physical Therapy Acute Care Daily Treatment Record       Date: 05/15/2021  Time: 0933  Patient's Name: Hector [name]  Date of Birth: 1961/09/25  Room Number: 367/A    Pertinent Information since last Physical Therapy visit :    Remains in ICU orally intubated      Precautions / Limitations :    No known precautions / limitations     Weight Bearing status, if applicable :    No Weightbearing restrictions     Subjective :   Orally intubated     Pain Rating :      Comments:    No facial grimacing noted with thera ex                               Orientation & Level of Orientation :    Sedated      6 Clicks Score :    1.) Turning from back to side while flat in bed without using a bed rail.    Total assistance (1)  2.) Moving from lying on back to sitting on the side of a flat bed without using bed rails.   Total assistance (1)  3.) Moving to and from a bed to a chair, including a wheelchair.    Total assistance (1)  4.) Standing up for chair using your arms (eg, wheelchair or bedside chair).   Total assistance (1)  5.) Walking in hospital room.   Total assistance (1)  6.) Climbing 3-5 steps with a railing.    Total assistance (1)    6 Clicks Plan: Score <12 - PT consult is appropriate; placement may be necessary at discharge      Treatment :       Sitting Balance:                            Standing Balance:                    Stairs:                            Therapeutic Exercise:   PROM to le's while supine in bed. No response to treatment.               Therapeutic Activity:             Canalith Repositioning:             Other Treatment Interventions:       Daily Assessment of Status :    Remains in ICU. No response to treatment.       Timed Charge Number of Minutes   Neuromuscular Re-Education     Gait Training      Therapeutic Activity      Therapeutic Exercise   8   Canalith  Re-positioning     Other     Total Treatment Time  8     Patient Education :            Interdisciplinary Communication :                Discharge Planning :   Long  Term Acute Care (LTAC)    Plan :   Continue Physical Therapy program     Jackie Plum, PT, DPT  05/15/2021, 15:15

## 2021-05-15 NOTE — Nurses Notes (Signed)
NP AGAIN NOTIFIED OF LABORED TUGGING TACHYPNEA  WILL PLACE ORDER FOR PRN FENTANYL BOLUSES

## 2021-05-15 NOTE — Progress Notes (Signed)
Name: Hector [name] MRN:  [id number]   Date of admission: 05/01/2021 Age: 60 y.o.       Name - Hector [name]    Date of service - 05/15/2021      Interval history  Patient remained intubated in the ICU.  His major issue is mental status.  The patient is very encephalopathic and does not follow commands.  The patient becomes very easily agitated and bucks the vent which then causes hypoxia.  He also had increased residual tube feedings however this was during times of agitation.  Tube feedings are now at 10 cc an hour.  His ammonia still remains high at 78 this morning and he is receiving lactulose.  Afebrile.  Hemodynamically stable.    Because of his mental status issues neurology was consulted and they requested an MRI of the brain as well as EEG.  EEG was completed this morning and pending read.    Physical exam  Filed Vitals:    05/15/21 0815 05/15/21 0830 05/15/21 0845 05/15/21 0900   BP: 131/69 (!) 143/73 (!) 163/89 (!) 189/87   Pulse: 68 73 84 (!) 102   Resp:       Temp:       SpO2: 100% 100% 100% 100%          Intake/Output Summary (Last 24 hours) at 05/15/2021 1013  Last data filed at 05/15/2021 0800  Gross per 24 hour   Intake 2802.19 ml   Output 4468 ml   Net -1665.81 ml        General:Intubated in the ICU with sedation, patient is on fentanyl prn, ketamine and Precedex...  He is agitated easily  Abdomen: soft, nontender, nondistended,small umbilical hernia  Skin: No jaundice, lesions, or rashes.  Lungs:On the ventilator   CV: Hemodynamically stable  Extremities:no significant edema        Labs/Imaging -       XR AP MOBILE CHEST performed on 05/15/2021 6:04 AM    CLINICAL HISTORY: Pleural effusions.      vent management     TECHNIQUE: Frontal view of the chest.    COMPARISON:  05/13/2021    FINDINGS:  A left PICC ends in the upper SVC. The endotracheal tube ends 4 cm above the carina. The enteric tube ends below the diaphragm.     The heart size is normal.     There is improving aeration  of both lungs most notably at both lung bases. A trace left pleural effusion persists.       IMPRESSION:  Improving aeration of both lungs. The left pleural effusion appears to have decreased in size.       ASSESSMENT & RECOMMENDATIONS:   59 y.o.malestatus post fall downstairs sustaining multiple left-sided rib fractures with subcutaneous emphysema,transverse process fracture of thoracic spine,and grade 3 splenic laceration  -continue weaning the patient from the ventilator  -the patient's mental status is still an issue however this may be a combination of alcohol withdrawal and a buildup of the sedation medications that he has been on, the patient does have a issue of cirrhosis with increased ammonia levels being treated with lactulose  -Lovenox for DVT prophylaxis  -continue tube feeding at goal, patient has hypernatremia and is receiving free water flushes  -appreciate the assistance of the ICU team    Clinton Quant, MD      Note: A portion of this documentation was generated using MMODAL (voice recognition software) and may contain syntax/voice recognition errors.

## 2021-05-15 NOTE — Progress Notes (Signed)
[facility] were consulted to work with pt for ETOH. At this time, pt is still on a ventilator. [facility] will follow up 05/19/2021 to check on status of pt, in hopes of completing initial BI.

## 2021-05-16 ENCOUNTER — Inpatient Hospital Stay (HOSPITAL_COMMUNITY): Payer: MEDICAID

## 2021-05-16 ENCOUNTER — Ambulatory Visit (HOSPITAL_COMMUNITY): Payer: MEDICAID

## 2021-05-16 DIAGNOSIS — E873 Alkalosis: Secondary | ICD-10-CM

## 2021-05-16 DIAGNOSIS — S36115A Moderate laceration of liver, initial encounter: Secondary | ICD-10-CM

## 2021-05-16 DIAGNOSIS — R140 Abdominal distension (gaseous): Secondary | ICD-10-CM

## 2021-05-16 DIAGNOSIS — R1110 Vomiting, unspecified: Secondary | ICD-10-CM

## 2021-05-16 LAB — CBC
HCT: 31 % — ABNORMAL LOW (ref 36.0–46.0)
HGB: 10.4 g/dL — ABNORMAL LOW (ref 13.9–16.3)
MCH: 34.7 pg — ABNORMAL HIGH (ref 25.4–34.0)
MCHC: 33.5 g/dL (ref 30.0–37.0)
MCV: 103.6 fL — ABNORMAL HIGH (ref 80.0–100.0)
MPV: 11.7 fL — ABNORMAL HIGH (ref 7.5–11.5)
PLATELETS: 121 10*3/uL — ABNORMAL LOW (ref 130–400)
RBC: 2.99 10*6/uL — ABNORMAL LOW (ref 4.30–5.90)
RDW: 15.1 % — ABNORMAL HIGH (ref 11.5–14.0)
WBC: 7.9 10*3/uL (ref 4.5–11.5)

## 2021-05-16 LAB — ARTERIAL BLOOD GAS, CO-OX, LYTES, LACTATE REFLEX
%FIO2 (ARTERIAL): 50 %
(T) PCO2: 37 mm/Hg (ref 35.0–45.0)
(T) PO2: 66 mm/Hg — ABNORMAL LOW (ref 72.0–100.0)
BASE EXCESS (ARTERIAL): 1.8 mmol/L — ABNORMAL HIGH (ref 0.0–1.0)
BICARBONATE (ARTERIAL): 26.2 mmol/L — ABNORMAL HIGH (ref 18.0–26.0)
CARBOXYHEMOGLOBIN: 2.3 % (ref 0.0–2.5)
CHLORIDE: 114 mmol/L — ABNORMAL HIGH (ref 101–111)
GLUCOSE: 116 mg/dL — ABNORMAL HIGH (ref 60–105)
HEMATOCRITRT: 40 %
HEMOGLOBIN: 13.2 g/dL (ref 12.0–18.0)
IONIZED CALCIUM: 1.25 mmol/L (ref 1.10–1.35)
LACTATE: 1.4 mmol/L — ABNORMAL HIGH (ref 0.0–1.3)
MET-HEMOGLOBIN: 1 % (ref 0.0–2.0)
O2CT: 16.9 % (ref 15.7–24.3)
OXYHEMOGLOBIN: 90.9 % (ref 85.0–98.0)
PAO2/FIO2 RATIO: 132 (ref <=200)
PCO2 (ARTERIAL): 37 mm/Hg (ref 35–45)
PH (ARTERIAL): 7.45 (ref 7.35–7.45)
PH (T): 7.45 (ref 7.35–7.45)
PO2 (ARTERIAL): 66 mm/Hg — ABNORMAL LOW (ref 72–100)
SODIUM: 148 mmol/L — ABNORMAL HIGH (ref 137–145)
TEMPERATURE, COMP: 37 C
WHOLE BLOOD POTASSIUM: 4.1 mmol/L (ref 3.5–4.6)

## 2021-05-16 LAB — BASIC METABOLIC PANEL
ANION GAP: 12 mmol/L (ref 5–19)
BUN/CREA RATIO: 57 — ABNORMAL HIGH (ref 6–20)
BUN: 37 mg/dL — ABNORMAL HIGH (ref 9–20)
CALCIUM: 9 mg/dL (ref 8.4–10.2)
CHLORIDE: 116 mmol/L — ABNORMAL HIGH (ref 98–107)
CO2 TOTAL: 23 mmol/L (ref 22–30)
CREATININE: 0.65 mg/dL — ABNORMAL LOW (ref 0.66–1.20)
ESTIMATED GFR: >60 mL/min/{1.73_m2} (ref >60)
GLUCOSE: 120 mg/dL — ABNORMAL HIGH (ref 74–106)
POTASSIUM: 3.8 mmol/L (ref 3.5–5.1)
SODIUM: 151 mmol/L — ABNORMAL HIGH (ref 137–145)

## 2021-05-16 LAB — URINE CULTURE,ROUTINE: URINE CULTURE: NO GROWTH

## 2021-05-16 LAB — MAGNESIUM: MAGNESIUM: 2.5 mg/dL — ABNORMAL HIGH (ref 1.6–2.3)

## 2021-05-16 LAB — SODIUM: SODIUM: 150 mmol/L — ABNORMAL HIGH (ref 137–145)

## 2021-05-16 LAB — ECG 12 LEAD
Atrial Rate: 147 {beats}/min
Calculated T Axis: 18 degrees
QRS Duration: 90 ms
QT Interval: 274 ms
QTC Calculation: 405 ms
Ventricular rate: 132 {beats}/min

## 2021-05-16 LAB — POC BLOOD GLUCOSE (RESULTS): GLUCOSE, POC: 142 mg/dl — ABNORMAL HIGH (ref 74–106)

## 2021-05-16 LAB — AMMONIA: AMMONIA: 105 umol/L — ABNORMAL HIGH (ref 9–30)

## 2021-05-16 LAB — PHOSPHORUS: PHOSPHORUS: 4.4 mg/dL (ref 2.5–4.5)

## 2021-05-16 MED ORDER — LACTULOSE 20 GRAM/30 ML ORAL SOLUTION
60.0000 mL | Freq: Four times a day (QID) | ORAL | Status: DC
Start: 2021-05-16 — End: 2021-05-17
  Administered 2021-05-16 – 2021-05-17 (×4): 60 mL via ORAL
  Filled 2021-05-16 (×4): qty 60

## 2021-05-16 MED ORDER — DEXTROSE 5 % IN WATER (D5W) INTRAVENOUS SOLUTION
INTRAVENOUS | Status: DC
Start: 2021-05-16 — End: 2021-05-17
  Administered 2021-05-16 – 2021-05-17 (×2): 0 g via INTRAVENOUS

## 2021-05-16 MED ORDER — DEXTROSE 5 % IN WATER (D5W) INTRAVENOUS SOLUTION
INTRAVENOUS | Status: DC
Start: 2021-05-16 — End: 2021-05-16

## 2021-05-16 MED ORDER — RIFAXIMIN 550 MG TABLET
550.0000 mg | ORAL_TABLET | Freq: Two times a day (BID) | ORAL | Status: DC
Start: 2021-05-16 — End: 2021-05-30
  Administered 2021-05-16 – 2021-05-20 (×8): 550 mg via ORAL
  Administered 2021-05-20: 0 mg via ORAL
  Administered 2021-05-21: 550 mg via ORAL
  Administered 2021-05-21: 0 mg via ORAL
  Administered 2021-05-22 – 2021-05-29 (×15): 550 mg via ORAL
  Filled 2021-05-16 (×27): qty 1

## 2021-05-16 MED ORDER — DEXTROSE 5 % IN WATER (D5W) INTRAVENOUS SOLUTION
500.0000 mL | Freq: Once | INTRAVENOUS | Status: AC
Start: 2021-05-16 — End: 2021-05-17
  Administered 2021-05-16: 500 mL via INTRAVENOUS
  Administered 2021-05-17: 0 mL via INTRAVENOUS

## 2021-05-16 NOTE — Progress Notes (Signed)
[facility]  Department of Family Medicine  Inpatient Progress Note    Name: Hector [name]  Age: 60 y.o.   Gender: male  MRN: [id number]  Date of Admission:  05/01/2021  PCP: No Pcp  Attending: Clinton Quant, MD  Consultants:   Consult Orders Placed This Encounter   Procedures   . IP CONSULT TO SOCIAL WORK   . IP CONSULT TO CARE MANAGEMENT   . IP CONSULT TO SOCIAL WORK   . IP CONSULT TO INTENSIVIST Requested Provider; Maurine Minister, Delfino Lovett E; 05/02/2021; 7:53 AM   . IP CONSULT TO SOCIAL WORK   . IP CONSULT TO NUTRITION SERVICES Reason for Consult: DETERMINE APPROPRIATE TUBE FEED   . IP CONSULT TO NUTRITION SERVICES Reason for Consult: DETERMINE APPROPRIATE TUBE FEED   . IP CONSULT TO NUTRITION SERVICES Reason for Consult: DETERMINE APPROPRIATE TUBE FEED   . Social Work Consult - LTAC eval   . IP CONSULT TO NEUROLOGY Requested Provider; Beau Fanny     Subjective:   No acute events overnight as per nursing staff. Evaluated patient at bedside, still intubated on mechanical ventilation. GCS 6 (E2V1M3). On Precedex and ketamine drip. Foley catheter in place draining amber colored urine. Afebrile, vital signs have been stable overnight.    Review of Systems:  As above  Objective:   Vital Signs:  BP (!) 90/55   Pulse 72   Temp 37.4 C (99.3 F)   Resp (!) 28   Ht 1.829 m (6')   Wt 105 kg (232 lb 2.3 oz)   SpO2 94%   BMI 31.48 kg/m     PHYSICAL EXAM  General: intubated, drowsy, unable to follow command during exam  Eyes: Conjunctiva clear.  HENT: intubated  Neck: no thyromegaly or lymphadenopathy  Lungs: bilateral rhonchi, decreased breath sounds  Cardiovascular: regular rate and rhythm, S1, S2 normal, no murmur, click, rub or gallop  Abdomen: tense, round protuberant abdomen, hypoactive bowel sounds  Extremities: No cyanosis or edema  Skin: Skin warm and dry  Psychiatric: sedated    Labs:  CBC:     7.9 (06/02 0501) \   10.4* (06/02 0501) /   121* (06/02 0501)      / 31.0* (06/02 0501) \       BMP:   151* (06/02 0501) 116* (06/02 0501) 37* (06/02 0501)    /     120* (06/02 0501)   3.8 (06/02 0501) 23 (06/02 0501) 0.65* (06/02 0501) \         In's and Out's:    Intake/Output Summary (Last 24 hours) at 05/16/2021 1006  Last data filed at 05/16/2021 0700  Gross per 24 hour   Intake 2001.16 ml   Output 1340 ml   Net 661.16 ml     Radiology Results:  XR AP MOBILE CHEST    Result Date: 05/15/2021  Impression Improving aeration of both lungs. The left pleural effusion appears to have decreased in size. Radiologist location ID: [id number]     Microbiology:  No results found for any visits on 05/01/21 (from the past 24 hour(s)).  Assessment and Plan:   1. Multiple rib fractures and grade 3 splenic injury secondary to accidental trauma  - Chest CT - multiple medial and left rib fractures; transverse process fractures at 5 and T6; small left pneumothorax; left basal infiltrate and small left effusion  - CT A/P - grade 3 splenic laceration  - Hgb 10.4; platelet count - 121  -  ABG shows metabolic alkalosis    2. Alcohol withdrawal syndrome  - Patient intubated due to depressed mental status and increased work of breathing  - EKG shows sinus rhythm  - Ammonia 105  - EEG pending  - CIWA assessment protocol  - Regular confusion assessment method  - Versed given x1 dose; Zyprexa 5 mg IM q12h prn for agitation  - Precedex  - Ketamine  - Lactulose  - Thiamine and folate  - Neurology consulted    Chronic medical conditions  - Continue home medications unless otherwise stated    Diet: DIET NPO - NOW  TUBE FEED - CONTINUOUS DRIP CONTINUOUS NO MEALS, TF ONLY; Vital High Protein; OG; Goal Rate (ml/hr): 60; Initial Rate (ml/hr): 60; Flush Tube with water (select amt): 100 ml; Flush Frequency: Q4H  DVT/PE: Lovenox  Code Status: Full    Tedra Coupe, MD   05/16/2021, 10:06   Family Medicine Resident, PGY-1  [facility]      ICU round  1. THE PATIENT WAS SEEN AND EXAMINED WITH THE NURSE PRACTITIONER ON CRITICAL  CARE ROUNDS.  2.  WE DISCUSSED THE PATIENT'S DIAGNOSTIC TESTS AND MEDICATIONS.  3. WE DISCUSSED THE PLAN.  4.  I WAS AVAILABLE TO CONSULT WITH ON ANY PROBLEMS.   5. The patient's abdomen is distended.  He had emesis this morning and is tube feedings were shut off and his orogastric tube was connected to suction and he had a total 2.8 L of volume out of the gastric tube as of 5:30 p.m..  He is on low intermittent suction.  He does have hypernatremia and sodium was 151 so therefore 1 L of D5 water at 50 cc an hours running with a follow-up sodium this evening.  Can re-evaluate sodium after the 1 L runs in.        6. He had a repeat ultrasound the chest.  Very little to no fluid present (pleural effusion).     7.  Ammonia level is further elevated.  Lactulose is ordered.   He is on thiamine 500 mg t.i.d. for 3 days.     8. He had a repeat KUB today which showed increased gaseous distension.     9. Continue IV antibiotics.    TOTAL CRITICAL CARE TIME IS 60 MINUTES.  Waynard Reeds, MD  Waynard Reeds, MD  05/16/2021, 17:51

## 2021-05-16 NOTE — Care Plan (Signed)
Will continue to monitor and assess. Will continue to follow current POC.

## 2021-05-16 NOTE — Progress Notes (Signed)
Name: Hector [name] MRN:  [id number]   Date of admission: 05/01/2021 Age: 60 y.o.       Name - Hector [name]    Date of service - 05/16/2021      Interval history  Patient remained intubated in the ICU.  Afebrile.  Patient is easily agitated and does not completely follow commands.  The patient's ammonia level increased since yesterday and he is on lactulose.  Neurology was consulted and they agree that this is likely a toxic and metabolic encephalopathy.  MRI was negative.    Patient is slowly developing more gaseous distention of his abdomen.  Abdominal x-ray yesterday showed diffuse gaseous distention of small bowel and colon consistent with an ileus.  Patient is having bowel movements especially with the lactulose.    Physical exam  Filed Vitals:    05/16/21 0630 05/16/21 0645 05/16/21 0655 05/16/21 0746   BP: 129/79 (!) 90/55     Pulse: 84 72     Resp:       Temp:    37.4 C (99.3 F)   SpO2: 95% 95% 94%           Intake/Output Summary (Last 24 hours) at 05/16/2021 0811  Last data filed at 05/16/2021 0700  Gross per 24 hour   Intake 2021.16 ml   Output 1340 ml   Net 681.16 ml        General:Intubated in the ICU with sedation,patient is on fentanyl prn,ketamine and Precedex.Marland KitchenMarland KitchenHe is agitated easily  Abdomen: soft, nontender, small umbilical hernia, more distended than yesterday and tympanitic  Skin: No jaundice, lesions, or rashes.  Lungs:On the ventilator   CV: Hemodynamically stable  Extremities:no significant edema      Labs/Imaging -     MRI BRAIN W/WO CONTRAST performed on 05/15/2021 10:34 PM    CLINICAL HISTORY: inconsistant neuro exam.  Trauma, splenic laceration.  Unable to wake pt calmy since sedated.    TECHNIQUE: Routine brain MRI without and with intravenous contrast.   INTRAVENOUS CONTRAST: 20 ml's of Dotarem    COMPARISON:  None.    FINDINGS:  Brain: Normal signal intensities.    Diffusion weighted images show no evidence of acute or recent infarct.   Postcontrast images show no  suspicious enhancement.    Ventricles: Normal.    Major Intracranial Vessels: Normal flow voids.    Sinuses: Clear.  Mastoids: Small amount of fluid signal is identified within both mastoid air cells.      IMPRESSION:  No acute finding.        XR ABD SUPINE performed on 05/15/2021 1:02 PM.    CLINICAL HISTORY: r/o ileus - abdominal distention.      ileus    TECHNIQUE: Single view abdomen.    COMPARISON:  None.      FINDINGS:  There is a diffuse gaseous distention of the small bowel. There is also gas within the cecum suggesting this could be an ileus.     No suspicious calcifications.    The bones are unremarkable.      IMPRESSION:  Diffuse gaseous distention in a pattern suggestive of ileus. The enteric tube ends in the stomach.        XR AP MOBILE CHEST performed on 05/16/2021 6:55 AM    CLINICAL HISTORY: Pleural effusions.  RESP FAIL-VENT    TECHNIQUE: Frontal view of the chest.    COMPARISON:  Yesterday    FINDINGS:  ET tube 4.7 cm above the carina. There is an  NG tube extending towards the stomach. The tip is not included on this film. There is a left PICC catheter in the SVC   Heart size is moderately enlarged.     There are bilateral perihilar and infrahilar infiltrates with obscuration of the left diaphragm       IMPRESSION:  Slightly increased infiltrates/vascular congestion    ASSESSMENT & RECOMMENDATIONS:   59 y.o.malestatus post fall downstairs sustaining multiple left-sided rib fractures with subcutaneous emphysema,transverse process fracture of thoracic spine,and grade 3 splenic laceration  -continueweaning the patient from the ventilator  -the patient's mental status is still an issue however this may be a combination of alcohol withdrawal and a buildup of the sedation medications that he has been on,the patient does have a issue of cirrhosis with increased ammonia levels being treated with lactulose  -Lovenox for DVT prophylaxis  -continue tube feedings, patient has  hypernatremia and is receiving free water flushes, patient is developing an ileus however he is still having bowel movements  -appreciate the assistance of the ICU team    Clinton Quant, MD      Note: A portion of this documentation was generated using MMODAL (voice recognition software) and may contain syntax/voice recognition errors.

## 2021-05-16 NOTE — Nurses Notes (Signed)
Spoke with Legrand Como Np to clarify NPO status and ask if the route needed changed from lactulose administration since TF is on hold and OGT is to LIS.     He stated to keep OGT to LIS but patient can still receive his medications via OGT.

## 2021-05-16 NOTE — Nurses Notes (Signed)
Patient left room 367  2115 - 220. During this time patient was in MRI with RN and RT.     During MRI Vital Signs:  2122: HR82 pulse ox 95% BP 146/77  2124:HR60 pulse ox 95% BP 127/89  2129:HR63 pulse ox 93% BP 133/64  2136:HR 78 pulse ox 94% BP 135/70  2148:HR77 pulse ox 94% BP 1136/66  2152:HR75 pulse ox 95% BP 125/73

## 2021-05-16 NOTE — OT Treatment (Signed)
[place]  Occupational Therapy Progress Note    Patient Name: Hector [name]  Date of Birth: 1961/02/02  Height:  182.9 cm (6')  Weight:  105 kg (232 lb 2.3 oz)  Room/Bed: 367/A  Payor: HEALTH PLAN MEDICAID / Plan: HEALTH PLAN MEDICAID / Product Type: Medicaid [facility] /     Assessment:       05/16/21 0910   Daily Activity AM-PAC/6-clicks Score   Putting on/Taking off clothing on lower body 1   Bathing 1   Toileting 1   Putting on/Taking off clothing on upper body 1   Personal grooming 1   Eating Meals 1   Raw Score Total 6   Standardized (t-scale) Score 17.07   CMS 0-100% Score 100   CMS Modifier CN         Discharge Needs:   Equipment Recommendation:  n/a    Discharge Disposition: LTACH    Plan:   Continue to follow patient according to established plan of care.  The risks/benefits of therapy have been discussed with the patient/caregiver and he/she is in agreement with the established plan of care.     Subjective & Objective:     Alert.  Does not follow commands.  ETT/vent.  FiO2 50, PEEP 10.  Observed to be spitting up around ETT.  RN suctioned large amount of liquid substance.  Treatment focus on ROM/care of patient.  Rolling right<>left dependent.  Dependent donn gown.  Dependent for suction.  OT provided orientation information and ROM throughout care.  Plan:  will follow and progress as indicated and tolerated.      Pain: 4/10 faces scale      Bathing D  Dressing D  Toileting   Grooming D  Eating D    Patient education orientation provided.            Therapist:   Landry Corporal, OT     Start Time: [phone]  End Time: 0925  Total Treatment Time: 15 minutes  Charges Entered: TEx1  Department Number: [phone]

## 2021-05-16 NOTE — PT Treatment (Signed)
[facility]  [place], [place] [address]  [phone]         Physical Therapy Acute Care Daily Treatment Record       Date: 05/16/2021  Time: 1037  Patient's Name: Hector [name]  Date of Birth: 1961-07-29  Room Number: 367/A    Pertinent Information since last Physical Therapy visit :    Remains intubated      Precautions / Limitations :    No known precautions / limitations     Weight Bearing status, if applicable :    No Weightbearing restrictions     Subjective :   Orally intubated     Pain Rating :      Comments:   No response to treatment                                  Orientation & Level of Orientation :    Sedated      6 Clicks Score :    1.) Turning from back to side while flat in bed without using a bed rail.    Total assistance (1)  2.) Moving from lying on back to sitting on the side of a flat bed without using bed rails.   Total assistance (1)  3.) Moving to and from a bed to a chair, including a wheelchair.    Total assistance (1)  4.) Standing up for chair using your arms (eg, wheelchair or bedside chair).   Total assistance (1)  5.) Walking in hospital room.   Total assistance (1)  6.) Climbing 3-5 steps with a railing.    Total assistance (1)    6 Clicks Plan: Score <83 - PT consult is appropriate; placement may be necessary at discharge      Treatment :         Sitting Balance:                            Standing Balance:                    Stairs:                            Therapeutic Exercise:  PROM to le's while supine in bed. No response to treatment.                 Therapeutic Activity:             Canalith Repositioning:             Other Treatment Interventions:       Daily Assessment of Status :    Remains intubated. Abdominal distention noted.       Timed Charge Number of Minutes   Neuromuscular Re-Education     Gait Training      Therapeutic Activity      Therapeutic Exercise   8   Canalith Re-positioning       Other     Total Treatment Time  8     Patient Education :            Interdisciplinary Communication :                Discharge Planning :   Long Term Acute  Care (LTAC)    Plan :   Continue Physical Therapy program     Jackie Plum, PT, DPT  05/16/2021, 15:22

## 2021-05-16 NOTE — Care Management Notes (Signed)
6/2  Remains in ICU on the Vent   Agitated at times  On Precedex and Ketamine  Sedation  MRI brain done  And showed No acute findings   PICC line, Tube feed     D/C needs uncertain at this time   CM will follow

## 2021-05-16 NOTE — Nurses Notes (Signed)
Jane Canary, NP with intensivist via [place]. Updated him on 2200 serum Na results.     Verbal orders to give a 500cc bolus over 2 hours then increase current d5w infusion to 100 cc/hr.

## 2021-05-17 ENCOUNTER — Other Ambulatory Visit: Payer: Self-pay

## 2021-05-17 ENCOUNTER — Inpatient Hospital Stay (HOSPITAL_COMMUNITY): Payer: MEDICAID

## 2021-05-17 DIAGNOSIS — K6389 Other specified diseases of intestine: Secondary | ICD-10-CM

## 2021-05-17 DIAGNOSIS — R933 Abnormal findings on diagnostic imaging of other parts of digestive tract: Secondary | ICD-10-CM

## 2021-05-17 LAB — URINALYSIS, MACRO/MICRO
GLUCOSE: NEGATIVE mg/dL
KETONES: NEGATIVE mg/dL
NITRITE: NEGATIVE
PH: 6 (ref 5.0–9.0)
PROTEIN: 50 mg/dL — AB
SPECIFIC GRAVITY: 1.027 (ref 1.003–1.035)
UROBILINOGEN: 4 mg/dL — AB (ref <2.0)

## 2021-05-17 LAB — ARTERIAL BLOOD GAS, CO-OX, LYTES, LACTATE REFLEX
%FIO2 (ARTERIAL): 50 %
%FIO2 (ARTERIAL): 50 %
(T) PCO2: 35 mm/Hg (ref 35.0–45.0)
(T) PO2: 107 mm/Hg — ABNORMAL HIGH (ref 72.0–100.0)
BASE EXCESS (ARTERIAL): 2.7 mmol/L — ABNORMAL HIGH (ref 0.0–1.0)
BASE EXCESS (ARTERIAL): 0.1 mmol/L (ref 0.0–1.0)
BICARBONATE (ARTERIAL): 27 mmol/L — ABNORMAL HIGH (ref 18.0–26.0)
BICARBONATE (ARTERIAL): 24.9 mmol/L (ref 18.0–26.0)
CARBOXYHEMOGLOBIN: 1.1 % (ref 0.0–2.5)
CARBOXYHEMOGLOBIN: 1.8 % (ref 0.0–2.5)
CHLORIDE: 116 mmol/L — ABNORMAL HIGH (ref 101–111)
CHLORIDE: 111 mmol/L (ref 101–111)
GLUCOSE: 111 mg/dL — ABNORMAL HIGH (ref 60–105)
GLUCOSE: 117 mg/dL — ABNORMAL HIGH (ref 60–105)
HEMATOCRITRT: 31 %
HEMATOCRITRT: 44 %
HEMOGLOBIN: 10.4 g/dL — ABNORMAL LOW (ref 12.0–18.0)
HEMOGLOBIN: 14.6 g/dL (ref 12.0–18.0)
IONIZED CALCIUM: 1.16 mmol/L (ref 1.10–1.35)
IONIZED CALCIUM: 1.16 mmol/L (ref 1.10–1.35)
LACTATE: 0.9 mmol/L (ref 0.0–1.3)
LACTATE: 1.3 mmol/L (ref 0.0–1.3)
MET-HEMOGLOBIN: 1 % (ref 0.0–2.0)
MET-HEMOGLOBIN: 1.4 % (ref 0.0–2.0)
O2CT: 14.3 % — ABNORMAL LOW (ref 15.7–24.3)
O2CT: 19 % (ref 15.7–24.3)
OXYHEMOGLOBIN: 96.7 % (ref 85.0–98.0)
OXYHEMOGLOBIN: 92.5 % (ref 85.0–98.0)
PAO2/FIO2 RATIO: 214 (ref <=200)
PAO2/FIO2 RATIO: 150 (ref <=200)
PCO2 (ARTERIAL): 35 mm/Hg (ref 35–45)
PCO2 (ARTERIAL): 39 mm/Hg (ref 35–45)
PH (ARTERIAL): 7.48 — ABNORMAL HIGH (ref 7.35–7.45)
PH (ARTERIAL): 7.41 (ref 7.35–7.45)
PH (T): 7.48 — ABNORMAL HIGH (ref 7.35–7.45)
PO2 (ARTERIAL): 107 mm/Hg — ABNORMAL HIGH (ref 72–100)
PO2 (ARTERIAL): 75 mm/Hg (ref 72–100)
SODIUM: 148 mmol/L — ABNORMAL HIGH (ref 137–145)
SODIUM: 141 mmol/L (ref 137–145)
TEMPERATURE, COMP: 37 C
WHOLE BLOOD POTASSIUM: 3.3 mmol/L — ABNORMAL LOW (ref 3.5–4.6)
WHOLE BLOOD POTASSIUM: 3.8 mmol/L (ref 3.5–4.6)

## 2021-05-17 LAB — CBC
HCT: 30.8 % — ABNORMAL LOW (ref 36.0–46.0)
HGB: 9.9 g/dL — ABNORMAL LOW (ref 13.9–16.3)
MCH: 33.5 pg (ref 25.4–34.0)
MCHC: 32.1 g/dL (ref 30.0–37.0)
MCV: 104.3 fL — ABNORMAL HIGH (ref 80.0–100.0)
MPV: 11.6 fL — ABNORMAL HIGH (ref 7.5–11.5)
PLATELETS: 107 10*3/uL — ABNORMAL LOW (ref 130–400)
RBC: 2.96 10*6/uL — ABNORMAL LOW (ref 4.30–5.90)
RDW: 15.6 % — ABNORMAL HIGH (ref 11.5–14.0)
WBC: 4.9 10*3/uL (ref 4.5–11.5)

## 2021-05-17 LAB — BASIC METABOLIC PANEL
ANION GAP: 7 mmol/L (ref 5–19)
BUN/CREA RATIO: 38 — ABNORMAL HIGH (ref 6–20)
BUN: 24 mg/dL — ABNORMAL HIGH (ref 9–20)
CALCIUM: 8.2 mg/dL — ABNORMAL LOW (ref 8.4–10.2)
CHLORIDE: 115 mmol/L — ABNORMAL HIGH (ref 98–107)
CO2 TOTAL: 25 mmol/L (ref 22–30)
CREATININE: 0.63 mg/dL — ABNORMAL LOW (ref 0.66–1.20)
ESTIMATED GFR: >60 mL/min/{1.73_m2} (ref >60)
GLUCOSE: 125 mg/dL — ABNORMAL HIGH (ref 74–106)
POTASSIUM: 3.7 mmol/L (ref 3.5–5.1)
SODIUM: 147 mmol/L — ABNORMAL HIGH (ref 137–145)

## 2021-05-17 LAB — COMPREHENSIVE METABOLIC PANEL, NON-FASTING
ALBUMIN/GLOBULIN RATIO: 1 — ABNORMAL LOW (ref 1.5–2.5)
ALBUMIN: 3.4 g/dL — ABNORMAL LOW (ref 3.5–5.0)
ALKALINE PHOSPHATASE: 148 U/L — ABNORMAL HIGH (ref 38–126)
ALT (SGPT): 39 U/L (ref <50)
ANION GAP: 10 mmol/L (ref 5–19)
AST (SGOT): 41 U/L (ref 17–59)
BILIRUBIN TOTAL: 1.7 mg/dL — ABNORMAL HIGH (ref 0.2–1.3)
BUN/CREA RATIO: 45 — ABNORMAL HIGH (ref 6–20)
BUN: 28 mg/dL — ABNORMAL HIGH (ref 9–20)
CALCIUM: 8.6 mg/dL (ref 8.4–10.2)
CHLORIDE: 114 mmol/L — ABNORMAL HIGH (ref 98–107)
CO2 TOTAL: 26 mmol/L (ref 22–30)
CREATININE: 0.62 mg/dL — ABNORMAL LOW (ref 0.66–1.20)
ESTIMATED GFR: >60 mL/min/{1.73_m2} (ref >60)
GLUCOSE: 123 mg/dL — ABNORMAL HIGH (ref 74–106)
POTASSIUM: 3.3 mmol/L — ABNORMAL LOW (ref 3.5–5.1)
PROTEIN TOTAL: 6.7 g/dL (ref 6.3–8.2)
SODIUM: 150 mmol/L — ABNORMAL HIGH (ref 137–145)

## 2021-05-17 LAB — MAGNESIUM: MAGNESIUM: 2.5 mg/dL — ABNORMAL HIGH (ref 1.6–2.3)

## 2021-05-17 LAB — RESPIRATORY CULTURE AND GRAM STAIN (PERFORMABLE)

## 2021-05-17 LAB — AMMONIA: AMMONIA: 107 umol/L — ABNORMAL HIGH (ref 9–30)

## 2021-05-17 LAB — URINE HOLD

## 2021-05-17 LAB — MICRO HOLD

## 2021-05-17 LAB — PHOSPHORUS: PHOSPHORUS: 4.2 mg/dL (ref 2.5–4.5)

## 2021-05-17 MED ORDER — POTASSIUM CHLORIDE 20 MEQ/100ML IN STERILE WATER INTRAVENOUS PIGGYBACK
20.0000 meq | INJECTION | INTRAVENOUS | Status: AC
Start: 2021-05-17 — End: 2021-05-17
  Administered 2021-05-17: 20 meq via INTRAVENOUS
  Administered 2021-05-17: 0 meq via INTRAVENOUS
  Administered 2021-05-17: 20 meq via INTRAVENOUS
  Administered 2021-05-17: 0 meq via INTRAVENOUS
  Administered 2021-05-17: 09:00:00 20 meq via INTRAVENOUS

## 2021-05-17 MED ORDER — LACTULOSE LIQ 200 GM IN SW 700 ML (TOT VOL 1000 ML) RETENTION ENEMA
200.0000 g | ENEMA | Freq: Three times a day (TID) | Status: DC
Start: 2021-05-17 — End: 2021-05-21
  Administered 2021-05-17 – 2021-05-21 (×12): 200 g via RECTAL
  Filled 2021-05-17 (×27): qty 1000

## 2021-05-17 MED ORDER — MIDAZOLAM 1 MG/ML INJECTION WRAPPER
1.0000 mg | Freq: Once | INTRAMUSCULAR | Status: AC
Start: 2021-05-17 — End: 2021-05-17
  Administered 2021-05-17: 1 mg via INTRAVENOUS
  Filled 2021-05-17: qty 2

## 2021-05-17 MED ORDER — DEXTROSE 5 % IN WATER (D5W) INTRAVENOUS SOLUTION
INTRAVENOUS | Status: DC
Start: 2021-05-17 — End: 2021-05-17

## 2021-05-17 MED ORDER — POTASSIUM CHLORIDE 2 MEQ/ML INTRAVENOUS SOLUTION
INTRAVENOUS | Status: DC
Start: 2021-05-17 — End: 2021-05-18
  Filled 2021-05-17 (×6): qty 1000

## 2021-05-17 MED ORDER — DEXTROSE 5 % IN WATER (D5W) INTRAVENOUS SOLUTION
INTRAVENOUS | Status: AC
Start: 2021-05-17 — End: 2021-05-17

## 2021-05-17 NOTE — Nurses Notes (Addendum)
Upon entering pt's room IV alarming Pt had reddened face, fighting the ventilator was diaphoretic and attempting to sit up. Pt given PRN Fentanyl 33m,cg with little result for agitation  Dr Maurine Minister called to bedside ordered 1mg  Versed for the pt also ordered blood gases, cultures blood and sputum and a UA.

## 2021-05-17 NOTE — Care Management Notes (Signed)
SS consult received for Health Care Surrogate. SS contacted and left a message for next of kin [name], patients significant other to contact SS to discuss surrogate paperwork. SS will await a return call.    Hector [name]. [name], [place]

## 2021-05-17 NOTE — Nurses Notes (Signed)
Spoke with Nira Conn Np via ascom. Updated her that potassium is 3.3 this am, there is a prn replacement order but the parameter is for a potassium of 2.1-2.7. She stated she will place replacement orders.     Updated her that the patient has received both his doses of lactulose tonight, og was clamped after. This am ammonia is 107.  Informed her he has had a total of 1200cc of bilious drainage from his NGT tonight. Discussed potentially may change route of lactulose to rectal enemas. No order received to do so yet.
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
## 2021-05-17 NOTE — Progress Notes (Signed)
Name: Hector [name] MRN:  [id number]   Date of admission: 05/01/2021 Age: 60 y.o.       Name - Hector [name]    Date of service - 05/17/2021      Interval history  Patient remains intubated in the ICU.  His ammonia level is still elevated and he is receiving lactulose.  The patient's major issue is mental status.  The patient has now developed an ileus and his OG tube is to suction.  The patient is still having bowel movements.  Afebrile.    Physical exam  Filed Vitals:    05/17/21 0545 05/17/21 0600 05/17/21 0649 05/17/21 0854   BP:  115/71     Pulse:       Resp: (!) 26 (!) 26     Temp:       SpO2:   99% 99%          Intake/Output Summary (Last 24 hours) at 05/17/2021 7619  Last data filed at 05/17/2021 0800  Gross per 24 hour   Intake 4640.69 ml   Output 4730 ml   Net -89.31 ml        General:Intubated in the ICU with sedation,patient is on fentanylprn,ketamine and Precedex.Marland KitchenMarland KitchenHe is agitatedeasily  Abdomen: soft, nontender, small umbilical hernia, distended  and tympanitic  Skin: No jaundice, lesions, or rashes.  Lungs:On the ventilator   CV: Hemodynamically stable  Extremities:no significant edema      Labs/Imaging -     XR AP MOBILE CHEST performed on 05/17/2021 6:07 AM    CLINICAL HISTORY: respiratory failure.    chest trauma.   fractured ribs.  RESP FAIL-VENT    TECHNIQUE: Frontal view of the chest.    COMPARISON:  Yesterday    FINDINGS:  The support lines and tubes are in stable and satisfactory position.   The heart size is normal.   There is some stable mild opacity at both lung bases.   There is a stable small left pleural effusion.      IMPRESSION:  NO SIGNIFICANT CHANGE SINCE THE PRIOR EXAM.      XR KUB performed on 05/17/2021 8:25 AM.    CLINICAL HISTORY: ileus.  evaluate ileus    TECHNIQUE: Single view abdomen.    COMPARISON:  05/16/2021    FINDINGS:  There are markedly distended loops of small bowel with a paucity of air in colon. Findings indicate a small bowel obstruction. Gaseous  distention of the stomach has decreased from the prior exam.    A nasogastric tube is present in the region of the stomach.    There are degenerative changes of the spine.      IMPRESSION:  PERSISTENT MARKEDLY DISTENDED LOOPS OF SMALL BOWEL SUGGESTING AN OBSTRUCTION.      ASSESSMENT & RECOMMENDATIONS:   59 y.o.malestatus post fall downstairs sustaining multiple left-sided rib fractures with subcutaneous emphysema,transverse process fracture of thoracic spine,and grade 3 splenic laceration  -continueweaning the patient from the ventilator  -the patient's mental status is still an issue however this may be a combination of alcohol withdrawal and a buildup of the sedation medications that he has been on,the patient does have a issue of cirrhosiswith increased ammonia levels being treated with lactulose  -Lovenox for DVT prophylaxis  -the patient has developed an ileus so leave the OG tube to suction except for medications and free water flushes...  The patient's ileus is likely due to inactivity, electrolyte abnormalities and medication related  - patient has hypernatremia and  he is receiving free water flushes  -appreciate the assistance of the ICU team    Clinton Quant, MD      Note: A portion of this documentation was generated using MMODAL (voice recognition software) and may contain syntax/voice recognition errors.

## 2021-05-17 NOTE — Progress Notes (Signed)
[facility] were consulted to work with pt for ETOH. At this time, pt is still on a ventilator. [facility] will follow up 05/19/2021 to check on status of pt, in hopes of completing initial BI.

## 2021-05-17 NOTE — PT Treatment (Signed)
[facility]  [place], [place] [address]  [phone]         Physical Therapy Acute Care Daily Treatment Record       Date: 05/17/2021  Time: 9233  Patient's Name: Hector [name]  Date of Birth: 11-30-1961  Room Number: 367/A    Pertinent Information since last Physical Therapy visit :    Remains intubated in ICU      Precautions / Limitations :    No known precautions / limitations     Weight Bearing status, if applicable :    No Weightbearing restrictions     Subjective :   Orally intubated     Pain Rating :      Comments:  No response to treatment                                 Orientation & Level of Orientation :    Unresponsive and Sedated      6 Clicks Score :    1.) Turning from back to side while flat in bed without using a bed rail.    Total assistance (1)  2.) Moving from lying on back to sitting on the side of a flat bed without using bed rails.   Total assistance (1)  3.) Moving to and from a bed to a chair, including a wheelchair.    Total assistance (1)  4.) Standing up for chair using your arms (eg, wheelchair or bedside chair).   Total assistance (1)  5.) Walking in hospital room.   Total assistance (1)  6.) Climbing 3-5 steps with a railing.    Total assistance (1)    6 Clicks Plan: Score <00 - PT consult is appropriate; placement may be necessary at discharge      Treatment :       Sitting Balance:                            Standing Balance:                    Stairs:                            Therapeutic Exercise:     PROM to le's while supine in bed. No response to treatment. LE edema is noted.              Therapeutic Activity:             Canalith Repositioning:             Other Treatment Interventions:       Daily Assessment of Status :    Remains in ICU, intubated.       Timed Charge Number of Minutes   Neuromuscular Re-Education     Gait Training      Therapeutic Activity      Therapeutic Exercise   8   Canalith  Re-positioning      Other     Total Treatment Time  8     Patient Education :            Interdisciplinary Communication :                Discharge Planning :  Long Term Acute Care (LTAC)    Plan :   Continue Physical Therapy program     Jackie Plum, PT, DPT  05/17/2021, 14:21

## 2021-05-17 NOTE — Care Plan (Signed)
Will continue to monitor and assess.      Problem: Fall Injury Risk  Goal: Absence of Fall and Fall-Related Injury  Outcome: Ongoing (see interventions/notes)     Problem: Alcohol Withdrawal  Goal: Alcohol Withdrawal Symptom Control  Outcome: Ongoing (see interventions/notes)     Problem: Acute Neurologic Deterioration (Alcohol Withdrawal)  Goal: Optimal Neurologic Function  Outcome: Ongoing (see interventions/notes)     Problem: Substance Misuse (Alcohol Withdrawal)  Goal: Readiness for Change Identified  Outcome: Ongoing (see interventions/notes)     Problem: Non-violent/Non-Self Destructive Restraints  Goal: Alternative methods tried prior to restraints  Outcome: Ongoing (see interventions/notes)  Goal: Patient free from injury and discomfort  Outcome: Ongoing (see interventions/notes)  Goal: Autonomy maintained at the highest possible level  Outcome: Ongoing (see interventions/notes)  Goal: Need for restraints reassessed per policy  Outcome: Ongoing (see interventions/notes)  Goal: Patient education provided  Outcome: Ongoing (see interventions/notes)  Goal: Problem Interventions  Outcome: Ongoing (see interventions/notes)

## 2021-05-17 NOTE — Progress Notes (Addendum)
[facility]  Department of Family Medicine  Inpatient Progress Note    Name: Hector [name]  Age: 60 y.o.   Gender: male  MRN: [id number]  Date of Admission:  05/01/2021  PCP: No Pcp  Attending: Clinton Quant, MD  Consultants:   Consult Orders Placed This Encounter   Procedures   . IP CONSULT TO SOCIAL WORK   . IP CONSULT TO CARE MANAGEMENT   . IP CONSULT TO SOCIAL WORK   . IP CONSULT TO INTENSIVIST Requested Provider; Maurine Minister, Delfino Lovett E; 05/02/2021; 7:53 AM   . IP CONSULT TO SOCIAL WORK   . IP CONSULT TO NUTRITION SERVICES Reason for Consult: DETERMINE APPROPRIATE TUBE FEED   . IP CONSULT TO NUTRITION SERVICES Reason for Consult: DETERMINE APPROPRIATE TUBE FEED   . IP CONSULT TO NUTRITION SERVICES Reason for Consult: DETERMINE APPROPRIATE TUBE FEED   . Social Work Consult - LTAC eval   . IP CONSULT TO [place] Requested Provider; Beau Fanny   . IP CONSULT TO SOCIAL WORK     Subjective:   No acute events overnight as per nursing staff. Evaluated patient at bedside, still intubated on mechanical ventilation. FiO2 at 50% saturating in the high 90's. GCS 7 (E3V1M3). On Precedex and ketamine drip. Foley catheter in place draining amber colored urine. Afebrile, vital signs have been stable overnight.    Review of Systems:  As above  Objective:   Vital Signs:  BP (!) 156/80   Pulse 72   Temp 37.2 C (98.9 F)   Resp (!) 26   Ht 1.829 m (6')   Wt 106 kg (233 lb 14.5 oz)   SpO2 99%   BMI 31.72 kg/m     PHYSICAL EXAM  General: intubated, drowsy, unable to follow command during exam  Eyes: Conjunctiva clear.  HENT: intubated  Neck: no thyromegaly or lymphadenopathy  Lungs: bilateral rhonchi, decreased breath sounds  Cardiovascular: regular rate and rhythm, S1, S2 normal, no murmur, click, rub or gallop  Abdomen: tense, round protuberant abdomen, hypoactive bowel sounds  Extremities: No cyanosis or edema  Skin: Skin warm and dry  Psychiatric: sedated    Labs:  CBC:     4.9 (06/03 0457) \   9.9*  (06/03 0457) /   107* (06/03 0457)      / 30.8* (06/03 0457) \      BMP:   150* (06/03 0457) 114* (06/03 0457) 28* (06/03 6720)    /     123* (06/03 0457)   3.3* (06/03 0457) 26 (06/03 0457) 0.62* (06/03 0457) \         In's and Out's:    Intake/Output Summary (Last 24 hours) at 05/17/2021 1159  Last data filed at 05/17/2021 1100  Gross per 24 hour   Intake 4640.69 ml   Output 4280 ml   Net 360.69 ml     Radiology Results:  XR ABD SUPINE    Result Date: 05/15/2021  Impression Diffuse gaseous distention in a pattern suggestive of ileus. The enteric tube ends in the stomach. Radiologist location ID: [id number]     MRI BRAIN W/WO CONTRAST    Result Date: 05/15/2021  Impression No acute finding. Radiologist location ID: [id number]     XR AP MOBILE CHEST    Result Date: 05/16/2021  Impression Slightly increased infiltrates/vascular congestion Radiologist location ID: [id number]     XR KUB    Result Date: 05/16/2021  Impression 1.Persistent gaseous intestinal distention suggesting an ileus. There  is increased gastric distention since yesterday Radiologist location ID: [id number]       Microbiology:  No results found for any visits on 05/01/21 (from the past 24 hour(s)).    Assessment and Plan:   1. Acute hypoxic respiratory failure due to multiple rib fractures and grade 3 splenic injury secondary to accidental trauma  - On mechanical ventilation. FiO2 decreased to 50%. O2 sat is in the high 90's. Will decrease PEEP.  - Chest CT - multiple medial and left rib fractures; transverse process fractures at 5 and T6; small left pneumothorax; left basal infiltrate and small left effusion  - CT A/P - grade 3 splenic laceration  - Hgb 10.4; platelet count - 121  - ABG shows metabolic alkalosis    2. Alcohol withdrawal syndrome  - Patient intubated due to depressed mental status and increased work of breathing.  - Patient's abdomen is distended. Imaging consistent with ileus. Patient on OG tube.  - EKG shows sinus rhythm  - Ammonia 105  -  EEG pending  - CIWA assessment protocol  - Regular confusion assessment method  - Versed given x1 dose; Zyprexa 5 mg IM q12h prn for agitation  - On Precedex and ketamine  - Lactulose  - Thiamine and folate  - Neurology consulted    Chronic medical conditions  - Continue home medications unless otherwise stated    Diet: DIET NPO - NOW  TUBE FEED - CONTINUOUS DRIP CONTINUOUS NO MEALS, TF ONLY; Vital High Protein; OG; Goal Rate (ml/hr): 60; Initial Rate (ml/hr): 60; Flush Tube with water (select amt): 100 ml; Flush Frequency: Q4H  DVT/PE: Lovenox  Code Status: Full    Tedra Coupe, MD   05/16/2021, 10:06   Family Medicine Resident, PGY-1  [facility]      [address], 2022;  5:08 P.M.        Patient was seen on critical care rounds this morning with the resident and the nurse practitioner present.  The resident has completed a in evaluation/assessment and treatment formulation plan with me present.  I spoke to the trauma surgeon this morning.  Were going to see what happens over the next 2 to 3 days to see what potential he has for weaning and if there is no signs of him progressing then he will need a tracheostomy and a feeding tube.       At the time of my visit, his Precedex was 0.6; ketamine 1 microgram/kilogram per hour.   He is on a vent on 500, rate of 22, 50% 10 cm of PEEP.  He has D5 water running at 125 cc an hour.  Her following up the sodium.  He is receiving lactulose via rectal administration.  He still had increased gastric output.  Abdomen is quite distended.  Breath sounds reveal bilateral rhonchi.  He had 815 cc urine output on night shift.       Follow-up chemistry from 1:06 p.m. shows sodium 147, potassium 3.7, chloride 115, CO2 25, BUN 24 creatinine 0.63 and glucose 125.        Urine output for this shift as of 5:00 p.m. was 665 cc, and orogastric output was 850 cc and he has put out about 900 cc of liquid output from the fecal manager.    PLAN:     1. Social service  says assisted and the patient's girlfriend will be a healthcare surrogate.     2.  Change IV fluids to D5, half  normal saline, 20 mg of potassium at 100 cc every hour.     3.  Tube feedings are off.     4.  Continue follow radiographs, ABGs, chemistry and CBC.    CRITICAL CARE TIME:   50 MINUTES.  Waynard Reeds, MD  05/17/2021, 17:17

## 2021-05-17 NOTE — OT Treatment (Signed)
[facility]  Occupational Therapy Progress Note    Patient Name: Hector [name]  Date of Birth: 1961/10/28  Height:  182.9 cm (6')  Weight:  106 kg (233 lb 14.5 oz)  Room/Bed: 367/A  Payor: [employer]ICA[employer] / Plan: [employer] MEDICAID / Product Type: Medicaid [place] /     Assessment:       05/17/21 1000   Daily Activity AM-PAC/6-clicks Score   Putting on/Taking off clothing on lower body 1   Bathing 1   Toileting 1   Putting on/Taking off clothing on upper body 1   Personal grooming 1   Eating Meals 1   Raw Score Total 6   Standardized (t-scale) Score 17.07   CMS 0-100% Score 100   CMS Modifier CN         Discharge Needs:   Equipment Recommendation: n/a    Discharge Disposition: LTACH    Plan:   Continue to follow patient according to established plan of care.  The risks/benefits of therapy have been discussed with the patient/caregiver and he/she is in agreement with the established plan of care.     Subjective & Objective:     Sedated, intubated/vent.  FiO2 50, PEEP 9.  Patient did not arouse or follow command for movement this morning.  OT provided orientation information and PROM x9 minutes BUE within available ranges.  Tolerated without signs of distress/discomfort.  Plan:  will follow and progress as indicated and tolerated.    Pain: faces scale 3/10    Level of consciousness sedated    Bathing D  Dressing D  Toileting D  Grooming D  Eating D    Patient education orientation provided            Therapist:   Landry Corporal, OT     Start Time: 1000  End Time: 1009  Total Treatment Time: 9 minutes  Charges Entered: TEx1  Department Number: [phone]

## 2021-05-17 NOTE — Care Management Notes (Signed)
[facility] Management Note    Patient Name: Hector [name]  Date of Birth: [birth year]  Sex: male  Date/Time of Admission: 05/01/2021  9:05 AM  Room/Bed: 367/A  Payor: [place] / Plan: [place] / Product Type: Medicaid [place] /    LOS: 16 days   Primary Care Providers:  Pcp, No (General)    Admitting Diagnosis:  Multiple rib fractures [S22.49XA]    Assessment:   Patient continues to need mechanical ventilation with sedation.. This morning ammonia level 107. Noted to have an ileus and OG tube is placed to suction     Discharge Plan:  Undetermined at this time      The patient will continue to be evaluated for developing discharge needs.     Case Manager: Minna Antis, RN  Phone: [phone]

## 2021-05-18 ENCOUNTER — Inpatient Hospital Stay (HOSPITAL_COMMUNITY): Payer: MEDICAID

## 2021-05-18 ENCOUNTER — Encounter (HOSPITAL_COMMUNITY): Payer: Self-pay

## 2021-05-18 DIAGNOSIS — D72819 Decreased white blood cell count, unspecified: Secondary | ICD-10-CM

## 2021-05-18 DIAGNOSIS — E870 Hyperosmolality and hypernatremia: Secondary | ICD-10-CM

## 2021-05-18 DIAGNOSIS — R61 Generalized hyperhidrosis: Secondary | ICD-10-CM

## 2021-05-18 DIAGNOSIS — G9340 Encephalopathy, unspecified: Secondary | ICD-10-CM

## 2021-05-18 DIAGNOSIS — K567 Ileus, unspecified: Secondary | ICD-10-CM

## 2021-05-18 HISTORY — DX: Ileus, unspecified: K56.7

## 2021-05-18 HISTORY — DX: Decreased white blood cell count, unspecified: D72.819

## 2021-05-18 LAB — RESPIRATORY CULTURE AND GRAM STAIN (PERFORMABLE)

## 2021-05-18 LAB — ARTERIAL BLOOD GAS/CO-OX
%FIO2 (ARTERIAL): 60 %
BASE DEFICIT: 2 mmol/L (ref 0.0–3.0)
BICARBONATE (ARTERIAL): 23.4 mmol/L (ref 18.0–26.0)
CARBOXYHEMOGLOBIN: 1.7 % (ref 0.0–2.5)
HEMOGLOBIN: 9.6 g/dL — ABNORMAL LOW (ref 12.0–18.0)
MET-HEMOGLOBIN: 1.6 % (ref 0.0–2.0)
OXYHEMOGLOBIN: 95.8 % (ref 85.0–98.0)
PAO2/FIO2 RATIO: 170 (ref <=200)
PCO2 (ARTERIAL): 33 mm/Hg — ABNORMAL LOW (ref 35–45)
PH (ARTERIAL): 7.43 (ref 7.35–7.45)
PO2 (ARTERIAL): 102 mm/Hg — ABNORMAL HIGH (ref 72–100)

## 2021-05-18 LAB — ARTERIAL BLOOD GAS, CO-OX, LYTES, LACTATE REFLEX
%FIO2 (ARTERIAL): 65 %
(T) PCO2: 30 mm/Hg — ABNORMAL LOW (ref 35.0–45.0)
(T) PO2: 102 mm/Hg — ABNORMAL HIGH (ref 72.0–100.0)
BASE DEFICIT: 0.5 mmol/L (ref 0.0–3.0)
BICARBONATE (ARTERIAL): 24.5 mmol/L (ref 18.0–26.0)
CARBOXYHEMOGLOBIN: 1.4 % (ref 0.0–2.5)
CHLORIDE: 113 mmol/L — ABNORMAL HIGH (ref 101–111)
GLUCOSE: 99 mg/dL (ref 60–105)
HEMATOCRITRT: 35 %
HEMOGLOBIN: 11.7 g/dL — ABNORMAL LOW (ref 12.0–18.0)
IONIZED CALCIUM: 1.16 mmol/L (ref 1.10–1.35)
LACTATE: 0.7 mmol/L (ref 0.0–1.3)
MET-HEMOGLOBIN: 1.3 % (ref 0.0–2.0)
O2CT: 16 % (ref 15.7–24.3)
OXYHEMOGLOBIN: 96.3 % (ref 85.0–98.0)
PAO2/FIO2 RATIO: 157 (ref <=200)
PCO2 (ARTERIAL): 30 mm/Hg — ABNORMAL LOW (ref 35–45)
PH (ARTERIAL): 7.48 — ABNORMAL HIGH (ref 7.35–7.45)
PH (T): 7.48 — ABNORMAL HIGH (ref 7.35–7.45)
PO2 (ARTERIAL): 102 mm/Hg — ABNORMAL HIGH (ref 72–100)
SODIUM: 142 mmol/L (ref 137–145)
TEMPERATURE, COMP: 37 C
WHOLE BLOOD POTASSIUM: 4.1 mmol/L (ref 3.5–4.6)

## 2021-05-18 LAB — CBC
HCT: 28.2 % — ABNORMAL LOW (ref 36.0–46.0)
HGB: 9.2 g/dL — ABNORMAL LOW (ref 13.9–16.3)
MCH: 33.8 pg (ref 25.4–34.0)
MCHC: 32.7 g/dL (ref 30.0–37.0)
MCV: 103.5 fL — ABNORMAL HIGH (ref 80.0–100.0)
MPV: 11.5 fL (ref 7.5–11.5)
PLATELETS: 92 10*3/uL — ABNORMAL LOW (ref 130–400)
RBC: 2.73 10*6/uL — ABNORMAL LOW (ref 4.30–5.90)
RDW: 15.3 % — ABNORMAL HIGH (ref 11.5–14.0)
WBC: 3.8 10*3/uL — ABNORMAL LOW (ref 4.5–11.5)

## 2021-05-18 LAB — BASIC METABOLIC PANEL
ANION GAP: 6 mmol/L (ref 5–19)
BUN/CREA RATIO: 28 — ABNORMAL HIGH (ref 6–20)
BUN: 18 mg/dL (ref 9–20)
CALCIUM: 8.1 mg/dL — ABNORMAL LOW (ref 8.4–10.2)
CHLORIDE: 114 mmol/L — ABNORMAL HIGH (ref 98–107)
CO2 TOTAL: 23 mmol/L (ref 22–30)
CREATININE: 0.64 mg/dL — ABNORMAL LOW (ref 0.66–1.20)
ESTIMATED GFR: >60 mL/min/{1.73_m2} (ref >60)
GLUCOSE: 109 mg/dL — ABNORMAL HIGH (ref 74–106)
POTASSIUM: 3.8 mmol/L (ref 3.5–5.1)
SODIUM: 143 mmol/L (ref 137–145)

## 2021-05-18 LAB — URINE CULTURE,ROUTINE: URINE CULTURE: NO GROWTH

## 2021-05-18 LAB — MAGNESIUM: MAGNESIUM: 2.4 mg/dL — ABNORMAL HIGH (ref 1.6–2.3)

## 2021-05-18 LAB — AMMONIA: AMMONIA: 68 umol/L — ABNORMAL HIGH (ref 9–30)

## 2021-05-18 LAB — PHOSPHORUS: PHOSPHORUS: 4 mg/dL (ref 2.5–4.5)

## 2021-05-18 MED ORDER — DEXTROSE 5 % AND 0.45 % SODIUM CHLORIDE INTRAVENOUS SOLUTION
INTRAVENOUS | Status: DC
Start: 2021-05-18 — End: 2021-05-19
  Filled 2021-05-18 (×7): qty 1000

## 2021-05-18 NOTE — Progress Notes (Signed)
Name: Hector [name] MRN:  [id number]   Date of admission: 05/01/2021 Age: 60 y.o.       Name - Hector [name]    Date of service - 05/18/2021      Interval history  No acute events overnight.  He remains intubated in the ICU.  Patient still becomes very agitated at times but does not consistently follow commands.  Sedation had to be weaned down because of bradycardia likely due to Precedex.  Patient is less distended on exam and continues to have bowel movements.  Tube feedings are on hold for now due to the ileus.    Physical exam  Filed Vitals:    05/18/21 0545 05/18/21 0600 05/18/21 0730 05/18/21 1000   BP: (!) 104/58 (!) 110/59 (!) 103/57    Pulse: 62 61 63    Resp:       Temp:       SpO2: 100% 100% 100% 98%          Intake/Output Summary (Last 24 hours) at 05/18/2021 1100  Last data filed at 05/18/2021 1000  Gross per 24 hour   Intake 2914.28 ml   Output 3340 ml   Net -425.72 ml        General:Intubated in the ICU with sedation,patient is on fentanylprn,ketamine and Precedex.Marland KitchenMarland KitchenHe is agitatedeasily  Abdomen: soft, nontender, small umbilical hernia,he is less distended today  Skin: No jaundice, lesions, or rashes.  Lungs:On the ventilator   CV: Hemodynamically stable  Extremities:no significant edema      Labs/Imaging -     XR KUB performed on 05/18/2021 10:21 AM.    CLINICAL HISTORY: f/u ileus.      ileus/vent    TECHNIQUE: Single view abdomen.    COMPARISON:  05/17/2021      FINDINGS:  There is diffuse gaseous distention of the small bowel and colon potentially representing ileus. An enteric tube is seen in the stomach.    There is a Foley catheter.    No suspicious calcifications.    Bibasilar atelectasis is present. Mild cardiomegaly.         IMPRESSION:  Diffuse gaseous distention of the bowel suggestive of an ileus.      XR AP MOBILE CHEST performed on 05/18/2021 6:50 AM    CLINICAL HISTORY: Chest pain/SOB, Left pleural effusion.  resp fail-vent    TECHNIQUE: Frontal view of the  chest.    COMPARISON:  Yesterday    FINDINGS:  The support lines and tubes are in stable and satisfactory position.   The heart size is normal.   Bibasilar opacities and a small left pleural effusion are unchanged.   The bones are unremarkable.      IMPRESSION:  NO SIGNIFICANT CHANGE SINCE THE PRIOR EXAM.    ASSESSMENT & RECOMMENDATIONS:   60 y.o.malestatus post fall downstairs sustaining multiple left-sided rib fractures with subcutaneous emphysema,transverse process fracture of thoracic spine,and grade 3 splenic laceration  -continueweaning the patient from the ventilator  -the patient's mental status is still an issue however this may be a combination of alcohol withdrawal and a buildup of the sedation medications that he has been on,the patient does have a issue of cirrhosiswith increased ammonia levels being treated with lactulose  -Lovenox for DVT prophylaxis  -the patient has developed an ileus so leave the OG tube to suction except for medications and free water flushes...  The patient's ileus is likely due to inactivity, electrolyte abnormalities and medication related  - patient has hypernatremia and  he is receiving free water flushes  -appreciate the assistance of the ICU team    Clinton Quant, MD      Note: A portion of this documentation was generated using MMODAL (voice recognition software) and may contain syntax/voice recognition errors.

## 2021-05-18 NOTE — Progress Notes (Addendum)
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 are stable.   No significant change in the appearance of the lungs.           Impression    NO SIGNIFICANT CHANGE SINCE THE PRIOR EXAM.      Radiologist location ID: [id number]     CT CHEST ABDOMEN PELVIS W IV CONTRAST     Status: None    Narrative    Hector [name]    RADIOLOGIST: Peterson Ao    CT CHEST ABDOMEN PELVIS W IV CONTRAST performed on 05/03/2021 10:02 AM    CLINICAL HISTORY: Reassess grade 3 splenic laceration and rib fractures/pneumothorax.  increasing respiratory distress    TECHNIQUE:  Chest, abdomen and pelvis CT with intravenous contrast.  CONTRAST:  75 ml's of Isovue 300    COMPARISON:  None.  # of known CTs in the past 12 months: 0   # of known Cardiac Nuclear Medicine Studies in the past 12 months: 0    FINDINGS:    Image quality is degraded secondary to respiratory motion artifact.    CT CHEST:  Hardware:  Endotracheal tube in satisfactory position. Transesophageal catheter terminates in the stomach.    Lymph nodes:   No mediastinal, hilar, or axillary lymphadenopathy.    Heart and Vasculature:  Cardiomegaly.  No pericardial effusion. Thoracic aorta and pulmonary arteries are unremarkable.  Lungs and Airways:  Worsening bilateral lower lobe consolidation. Mild underlying emphysematous changes.    Pleura: Trace left  pleural effusion. Interval resolution of the previously seen small left-sided pneumothorax.    Bones: Redemonstration of multiple displaced left-sided rib fractures. Interval decrease in the subcutaneous air in the left chest wall.      CT ABDOMEN/PELVIS:  Liver:   Nodular surface contours with hypertrophy of the caudate lobe compatible with cirrhosis    Gallbladder:   Cholelithiasis    Spleen:   The known splenic laceration is not as well visualized on the current exam. No abnormal perisplenic fluid collection is seen.    Pancreas:   Unremarkable.    Adrenals:   Unremarkable.    Kidneys:   Unremarkable.    Bladder:  Decompressed with an indwelling Foley catheter    Prostate:  Unremarkable.    Bowel:   No bowel obstruction.    Appendix:  Not visualized    Lymph nodes:  No suspicious lymph node enlargement.    Vasculature:   Mild diffuse atherosclerotic calcifications are noted.     Peritoneum / Retroperitoneum: No ascites.  No free air.    Bones:   Degenerative changes of the spine.          Impression    RESPIRATORY MOTION DEGRADED EXAM.    INTERVAL RESOLUTION OF THE PREVIOUSLY SEEN SMALL LEFT-SIDED PNEUMOTHORAX.    WORSENING BILATERAL LOWER LOBE CONSOLIDATION THAT COULD REFLECT ATELECTASIS AND/OR PNEUMONIA    REDEMONSTRATION OF MULTIPLE DISPLACED LEFT-SIDED RIB FRACTURES WITH DECREASING SUBCUTANEOUS AIR IN THE CHEST WALL.    THE KNOWN GRADE 3 SPLENIC LACERATION IS NOT AS WELL VISUALIZED ON THE CURRENT EXAM AND IS STABLE TO IMPROVED FROM THE 05/01/2021 EXAM. NO NEW ACUTE FINDINGS IN THE ABDOMEN/PELVIS.      One or more dose reduction techniques were used (e.g., Automated exposure control, adjustment of the mA and/or kV according to patient size, use of iterative reconstruction technique).      Radiologist location ID: [id number]     XR AP MOBILE CHEST     Status: None    Narrative    Hector [name] Snowberger    RADIOLOGIST: Betsey Amen, MD    XR AP MOBILE CHEST performed on 05/04/2021 7:03 AM    CLINICAL HISTORY:  Trauma.  resp fail-vent    TECHNIQUE: Frontal view of the chest.    COMPARISON:  05/03/2021    FINDINGS:  Endotracheal tube in place whose tip is 3.9 cm from the carina. Nasogastric tube in place terminating over the stomach. Left arm PICC in place whose tip is in the lower SVC.   Heart size is moderately enlarged.     Small bilateral pleural effusions similar prior exam. Bibasilar atelectasis/pneumonia. No pneumothorax nor vascular congestion. No significant change.           Impression    No significant change.      Radiologist location ID: [id number]     XR AP MOBILE CHEST     Status: None    Narrative    Harace [name] Strang    RADIOLOGIST: Diana Eves, MD    XR AP MOBILE CHEST performed on 05/05/2021 6:31 AM    CLINICAL HISTORY: Trauma.  trauma/vent    TECHNIQUE: Frontal view of the chest.    COMPARISON:  Yesterday    FINDINGS:  Support tubes and lines are unchanged.   There is stable mild enlargement of the cardiac silhouette. The pulmonary vasculature is within  normal limits.   There are persistent small bilateral pleural effusions with associated bibasilar atelectasis/infiltrate. These findings have improved compared to prior study.           Impression    As above      Radiologist location ID: [id number]     XR AP MOBILE CHEST     Status: None    Narrative    Dekari [name] Klebba    RADIOLOGIST: Ileene Hutchinson    XR AP MOBILE CHEST performed on 05/05/2021 1:14 PM    CLINICAL HISTORY: Post Bronchoscopy.  vent     TECHNIQUE: Frontal view of the chest.    COMPARISON:  Chest radiograph dated 05/05/2018    FINDINGS:  The endotracheal tube terminates 5 cm above the carina. An enteric tube courses into the stomach. A left upper extremity PICC terminates near the superior cavoatrial junction.  The heart size is normal.   Lung volumes are low with hazy bibasilar airspace opacities. There is no pneumothorax.   The bones are unremarkable.        Impression    1.Bibasilar atelectasis or pneumonia/aspiration  2.No  pneumothorax  3.Endotracheal tube terminating 5 cm above the carina      Radiologist location ID: [id number]     XR AP MOBILE CHEST     Status: None    Narrative    Abner [name] Sylvestre    RADIOLOGIST: Berton Bon, MD    XR AP MOBILE CHEST performed on 05/06/2021 6:32 AM    CLINICAL HISTORY: Trauma.  trauma/vent/resp failure    TECHNIQUE: Frontal view of the chest.    COMPARISON:  Yesterday    FINDINGS:  ET tube 3 cm above the carina. There is an NG tube in the stomach. There is a left PICC catheter in the SVC.   Heart size is moderately enlarged.     There are bilateral perihilar infiltrates   Mild right dorsal scoliosis        Impression    Persistent perihilar infiltrates/vascular congestion slightly worse on the left since yesterday      Radiologist location ID: [id number]     XR AP MOBILE CHEST     Status: None    Narrative    Levis [name] Wettstein    RADIOLOGIST: Dorisann Frames, MD    XR AP MOBILE CHEST performed on 05/07/2021 6:11 AM    CLINICAL HISTORY: Trauma.  on vent     TECHNIQUE: Frontal view of the chest.    COMPARISON:  Yesterday    FINDINGS:  The support lines and tubes are in stable and satisfactory position.   Cardiac and mediastinal contours are stable.   No significant change in the appearance of the lungs.   Extensive bilateral pulmonary infiltrates persist with a moderate size left pleural effusion.        Impression    NO SIGNIFICANT CHANGE SINCE THE PRIOR EXAM.      Radiologist location ID: [id number]     [place] CHEST (EFFUSION)     Status: None    Narrative    Valente [name] Feutz    RADIOLOGIST: Colletta Maryland, MD    [place] CHEST (EFFUSION) performed on 05/07/2021 11:17 AM    CLINICAL HISTORY: pleural effusions..  check left pleural effusion    TECHNIQUE:  Ultrasound imaging of left chest.    COMPARISON:  None.    FINDINGS:  There is a moderate-sized left pleural effusion.        Impression  LEFT PLEURAL EFFUSION        Radiologist location ID: [id number]     US THORACENTESIS     Status: None    Narrative     *Procedure not read by radiology.    *Please Refer to Procedure Note for result.   XR AP MOBILE CHEST     Status: None    Narrative    Latroy [name] Pritt    RADIOLOGIST: Berton Bon, MD    XR AP MOBILE CHEST performed on 05/07/2021 4:40 PM    CLINICAL HISTORY: POST THORACENTESIS.  s/p left thoracentisis.     TECHNIQUE: Frontal view of the chest.    COMPARISON:  Previous exam from today    FINDINGS:  ET tube 5 cm above the carina. There is an NG tube in the stomach. There is a left PICC catheter in the SVC   Heart size is mildly enlarged.     There are perihilar and basilar infiltrates. Possible small effusions obscuring the diaphragms   There are multiple left lateral rib fracture deformities        Impression    Negative for pneumothorax after thoracentesis      Radiologist location ID: [id number]     XR AP MOBILE CHEST     Status: None    Narrative    Jesstin [name] Piche    RADIOLOGIST: Otho Najjar, MD    XR AP MOBILE CHEST performed on 05/09/2021 6:15 AM    CLINICAL HISTORY: Mult. L rib fx.s, effusion, intubated.  resp fail-vent, effusion, multiple rib fx's    TECHNIQUE: Frontal view of the chest.    COMPARISON:  05/07/2021    FINDINGS:  The support lines and tubes are in stable and satisfactory position. No pneumothorax.     There is increasing opacity bilaterally in the mid lung fields with infiltrate, effusion and vascular congestion. This could be inflammatory but the symmetry suggests development of pulmonary edema. Clinical correlation recommended.      Impression    Increasing opacity bilaterally suggesting increasing CHF. Clinical correlation recommended.      Radiologist location ID: [id number]     XR AP MOBILE CHEST     Status: None    Narrative    Jmarion [name] Pifer    RADIOLOGIST: Colletta Maryland, MD    XR AP MOBILE CHEST performed on 05/10/2021 6:19 AM    CLINICAL HISTORY: respiratory failure.  resp fail-vent    TECHNIQUE: Frontal view of the chest.    COMPARISON:  05/09/2021    FINDINGS:  The support lines  and tubes are in stable and satisfactory position.   Cardiac and mediastinal contours are stable.   Airspace disease is present in the mid to lower lungs with probable bilateral pleural effusions. Lung volumes are slightly improved since yesterday           Impression    BILATERAL AIRSPACE DISEASE AND PLEURAL EFFUSIONS WITH SOME IMPROVEMENT SINCE YESTERDAY      Radiologist location ID: [id number]     CT ANGIO CHEST FOR PULMONARY EMBOLUS W IV CONTRAST     Status: None    Narrative    Axyl [name] Mraz    RADIOLOGIST: Colletta Maryland, MD    CT ANGIO CHEST FOR PULMONARY EMBOLUS W IV CONTRAST performed on 05/10/2021 11:49 AM    CLINICAL HISTORY: hypoxia.  on the Vent , increased hypoxia. CXR showed increased opacities  Increase in CHF  Lung sounds coarse  . fall on 05-01-21  TECHNIQUE: CTA imaging of the chest with intravenous contrast.  3D reconstructions.  CONTRAST:  100 ml's of Isovue 370    COMPARISON: 05/03/2021  # of known CTs in the past 12 months: 7   # of known Cardiac Nuclear Medicine Studies in the past 12 months: 0         FINDINGS:  Hardware:  There is an NG tube in the stomach. An ET tube is present in the trachea. There is a left-sided PICC line in the SVC.    Lymph nodes:   No mediastinal, hilar, or axillary lymphadenopathy.    Heart:  Coronary artery calcifications are noted.        RV/LV Diameter Ratio: N/A    Thoracic Aorta:  No thoracic aortic aneurysm or dissection.    Pulmonary Vessels:  No evidence of acute pulmonary emboli through the major subsegmental branches.    Lungs and Airways:  There is patient respiratory motion artifact which limits evaluation of the lungs.      Bilateral lower lobe airspace disease with air bronchograms. Pneumonia versus atelectasis.      Pleura: Bilateral pleural effusions left larger than right    Upper Abdomen: Cirrhosis with splenomegaly. Small volume of ascites. Probable cholelithiasis.    Bones: Bone windows are unremarkable.        Impression    1.NO PULMONARY  EMBOLUS  2.BILATERAL PLEURAL EFFUSIONS  3.BILATERAL LOWER LOBE AIRSPACE DISEASE. PNEUMONIA VERSUS ATELECTASIS      One or more dose reduction techniques were used (e.g., Automated exposure control, adjustment of the mA and/or kV according to patient size, use of iterative reconstruction technique).      Radiologist location ID: [id number]     XR AP MOBILE CHEST     Status: None    Narrative    Joriel [name] Hoose    RADIOLOGIST: Colletta Maryland, MD    XR AP MOBILE CHEST performed on 05/11/2021 5:35 AM    CLINICAL HISTORY: respiratory failure.  resp fail-vent    TECHNIQUE: Frontal view of the chest.    COMPARISON:  Yesterday    FINDINGS:  The support lines and tubes are in stable and satisfactory position.   Cardiac and mediastinal contours are stable.   Bilateral airspace disease with bilateral pleural effusions similar to yesterday           Impression    NO ACUTE FINDINGS.      Radiologist location ID: [id number]     CT BRAIN WO IV CONTRAST     Status: None    Narrative    Ihsan [name] Gangwer    RADIOLOGIST: Colletta Maryland, MD    CT BRAIN WO IV CONTRAST performed on 05/12/2021 2:27 AM    CLINICAL HISTORY: altered mental status.  Pt intubated and per RN very restless this early AM    TECHNIQUE:  Head CT without intravenous contrast.    COMPARISON: 05/01/2021  # of known CTs in the past 12 months: 8   # of known Cardiac Nuclear Medicine Studies in the past 12 months: 0    FINDINGS: This study is degraded by patient motion artifact  There is no acute intracranial hemorrhage, mass effect, or evidence of large acute infarct.    Brain: Low density in the periventricular white matter suggests mild chronic small vessel ischemic changes.    CSF Spaces: Mild generalized cerebral atrophy     Sinuses/Mastoids:  Clear at visualized levels     Bones: Unremarkable  Impression    CHRONIC CHANGES.  NO ACUTE FINDINGS.       One or more dose reduction techniques were used (e.g., Automated exposure control, adjustment of the mA and/or kV  according to patient size, use of iterative reconstruction technique).      Radiologist location ID: [id number]     XR AP MOBILE CHEST     Status: None    Narrative    Tharon [name] Wiseman    RADIOLOGIST: Colletta Maryland, MD    XR AP MOBILE CHEST performed on 05/12/2021 6:20 AM    CLINICAL HISTORY: respiratory failure.  vent management     TECHNIQUE: Frontal view of the chest.    COMPARISON:  Yesterday    FINDINGS:  The support lines and tubes are in stable and satisfactory position.   Cardiac and mediastinal contours are stable.   There is a left pleural effusion and bilateral airspace disease with possible right pleural effusion. Mild improvement in the right lung since yesterday.           Impression    BILATERAL AIRSPACE DISEASE AND PLEURAL EFFUSIONS LEFT WORSE      Radiologist location ID: [id number]     XR AP MOBILE CHEST     Status: None    Narrative    Floy [name] Bollman    RADIOLOGIST: Colletta Maryland, MD    XR AP MOBILE CHEST performed on 05/13/2021 8:23 AM    CLINICAL HISTORY: respiratory failure, PNA.  resp.failure, PNA, vent    TECHNIQUE: Frontal view of the chest.    COMPARISON:  Yesterday    FINDINGS:  The support lines and tubes are in stable and satisfactory position.   Cardiac and mediastinal contours are stable.   There is a left pleural effusion unchanged from yesterday. The right lung is improved since yesterday with improved lung volumes.           Impression    PERSISTENT LEFT PLEURAL EFFUSION      Radiologist location ID: [id number]     XR AP MOBILE CHEST     Status: None    Narrative    Elric [name] Folson    RADIOLOGIST: Berton Bon, MD    XR AP MOBILE CHEST performed on 05/14/2021 6:07 AM    CLINICAL HISTORY: Pleural effusions.  vent management. pleural effusion    TECHNIQUE: Frontal view of the chest.    COMPARISON:  Yesterday    FINDINGS:  ET tube 4.8 cm above the carina.. There is an NG tube extending at least into the distal esophagus and obscured over the upper abdomen. There is a left PICC catheter  in the SVC   Heart size is moderately enlarged.     There are perihilar infiltrates with possible effusions obscuring the diaphragms especially on the left           Impression    Stable infiltrates/vascular congestion with probable effusions      Radiologist location ID: [id number]     XR AP MOBILE CHEST     Status: None    Narrative    Parke [name] Memon    RADIOLOGIST: Brayton El, MD    XR AP MOBILE CHEST performed on 05/15/2021 6:04 AM    CLINICAL HISTORY: Pleural effusions.      vent management     TECHNIQUE: Frontal view of the chest.    COMPARISON:  05/13/2021    FINDINGS:  A left PICC ends in the upper SVC. The endotracheal  tube ends 4 cm above the carina. The enteric tube ends below the diaphragm.     The heart size is normal.     There is improving aeration of both lungs most notably at both lung bases. A trace left pleural effusion persists.         Impression    Improving aeration of both lungs. The left pleural effusion appears to have decreased in size.       Radiologist location ID: [id number]     XR ABD SUPINE     Status: None    Narrative    Kadrian [name] Jeffreys    RADIOLOGIST: Brayton El, MD    XR ABD SUPINE performed on 05/15/2021 1:02 PM.    CLINICAL HISTORY: r/o ileus - abdominal distention.      ileus    TECHNIQUE: Single view abdomen.    COMPARISON:  None.      FINDINGS:  There is a diffuse gaseous distention of the small bowel. There is also gas within the cecum suggesting this could be an ileus.     No suspicious calcifications.    The bones are unremarkable.        Impression    Diffuse gaseous distention in a pattern suggestive of ileus. The enteric tube ends in the stomach.        Radiologist location ID: [id number]     MRI BRAIN W/WO CONTRAST     Status: None    Narrative    Jaryd [name] Dieckman    RADIOLOGIST: Betsey Amen, MD    MRI BRAIN W/WO CONTRAST performed on 05/15/2021 10:34 PM    CLINICAL HISTORY: inconsistant neuro exam.  Trauma, splenic laceration.  Unable to  wake pt calmy since sedated.    TECHNIQUE: Routine brain MRI without and with intravenous contrast.   INTRAVENOUS CONTRAST: 20 ml's of Dotarem    COMPARISON:  None.    FINDINGS:  Brain: Normal signal intensities.    Diffusion weighted images show no evidence of acute or recent infarct.   Postcontrast images show no suspicious enhancement.    Ventricles: Normal.    Major Intracranial Vessels: Normal flow voids.    Sinuses: Clear.  Mastoids: Small amount of fluid signal is identified within both mastoid air cells.          Impression    No acute finding.        Radiologist location ID: [id number]     XR AP MOBILE CHEST     Status: None    Narrative    Mattison [name] Ferrick    RADIOLOGIST: Berton Bon, MD    XR AP MOBILE CHEST performed on 05/16/2021 6:55 AM    CLINICAL HISTORY: Pleural effusions.  RESP FAIL-VENT    TECHNIQUE: Frontal view of the chest.    COMPARISON:  Yesterday    FINDINGS:  ET tube 4.7 cm above the carina. There is an NG tube extending towards the stomach. The tip is not included on this film. There is a left PICC catheter in the SVC   Heart size is moderately enlarged.     There are bilateral perihilar and infrahilar infiltrates with obscuration of the left diaphragm           Impression    Slightly increased infiltrates/vascular congestion      Radiologist location ID: [id number]     XR KUB     Status: None    Narrative    Troye [name] Wingerter  RADIOLOGIST: Berton Bon, MD    XR KUB performed on 05/16/2021 9:31 AM.    CLINICAL HISTORY: ileus.  evaluate ileus - abdominal distention    TECHNIQUE: Single view abdomen.    COMPARISON:  Yesterday    FINDINGS:  There is gaseous gastric distention. There is some mild diffuse gaseous small bowel distention. Some air is also suggested in the right colon    No suspicious calcifications.    There is lumbar spurring  There is an NG tube with the tip of the proximal stomach         Impression    1.Persistent gaseous intestinal distention suggesting an ileus. There is increased  gastric distention since yesterday        Radiologist location ID: [id number]     [place] CHEST (EFFUSION)     Status: None    Narrative    Alif [name] Delsanto    RADIOLOGIST: Brian Schambach    [place] CHEST (EFFUSION) performed on 05/16/2021 2:28 PM    CLINICAL HISTORY: evaluate for thoracentesis.  bilateral chest     COMPARISON: None.    FINDINGS:  Ultrasound was used to evaluate for pleural effusions.    There is a small amount of perihepatic ascites. There is a trace right effusion. No left pleural fluid is seen.       Impression    As above.        Radiologist location ID: [id number]     XR AP MOBILE CHEST     Status: None    Narrative    Marinus [name] Hedeen    RADIOLOGIST: Danne Baxter, MD    XR AP MOBILE CHEST performed on 05/17/2021 6:07 AM    CLINICAL HISTORY: respiratory failure.    chest trauma.   fractured ribs.  RESP FAIL-VENT    TECHNIQUE: Frontal view of the chest.    COMPARISON:  Yesterday    FINDINGS:  The support lines and tubes are in stable and satisfactory position.   The heart size is normal.   There is some stable mild opacity at both lung bases.   There is a stable small left pleural effusion.        Impression    NO SIGNIFICANT CHANGE SINCE THE PRIOR EXAM.      Radiologist location ID: [id number]     XR KUB     Status: None    Narrative    Lavoy [name] Ayuso    RADIOLOGIST: Danne Baxter, MD    XR KUB performed on 05/17/2021 8:25 AM.    CLINICAL HISTORY: ileus.  evaluate ileus    TECHNIQUE: Single view abdomen.    COMPARISON:  05/16/2021    FINDINGS:  There are markedly distended loops of small bowel with a paucity of air in colon. Findings indicate a small bowel obstruction. Gaseous distention of the stomach has decreased from the prior exam.    A nasogastric tube is present in the region of the stomach.    There are degenerative changes of the spine.        Impression    PERSISTENT MARKEDLY DISTENDED LOOPS OF SMALL BOWEL SUGGESTING AN OBSTRUCTION.        Radiologist location ID: [id number]     XR AP MOBILE  CHEST     Status: None    Narrative    Barnes [name] Shibuya    RADIOLOGIST: Ileene Hutchinson    XR AP MOBILE CHEST performed on 05/18/2021 6:50 AM    CLINICAL HISTORY:  Chest pain/SOB, Left pleural effusion.  resp fail-vent    TECHNIQUE: Frontal view of the chest.    COMPARISON:  Yesterday    FINDINGS:  The support lines and tubes are in stable and satisfactory position.   The heart size is normal.   Bibasilar opacities and a small left pleural effusion are unchanged.   The bones are unremarkable.        Impression    NO SIGNIFICANT CHANGE SINCE THE PRIOR EXAM.      Radiologist location ID: [id number]     XR KUB     Status: None    Narrative    Hawkin [name] Alen    RADIOLOGIST: Brayton El, MD    XR KUB performed on 05/18/2021 10:21 AM.    CLINICAL HISTORY: f/u ileus.      ileus/vent    TECHNIQUE: Single view abdomen.    COMPARISON:  05/17/2021      FINDINGS:  There is diffuse gaseous distention of the small bowel and colon potentially representing ileus. An enteric tube is seen in the stomach.    There is a Foley catheter.    No suspicious calcifications.    Bibasilar atelectasis is present. Mild cardiomegaly.           Impression    Diffuse gaseous distention of the bowel suggestive of an ileus.        Radiologist location ID: [id number]        All current radiology studies were reviewed.    PHYSICAL EXAMINATION  Temperature: 37.8 C (100 F)  Heart Rate: 59  BP (Non-Invasive): 110/65  Respiratory Rate: (!) 26  SpO2: 99 %    General:  Sedate.  He is orally intubated and ventilated.  No signs of any increased work of breathing.  Skin:  Warm and dry.    HEENT:  Normocephalic.  Oral endotracheal tube and orogastric tube in place.  Sclera are nonicteric.  His pupils react.    Neck:  No obvious JVD.    Heart:  S1, S2.  No murmurs.  Heart rate 72;     blood pressure 132/69.    Lungs:  Lungs are clear with decreased at the bases.    Abdomen:  Abdomen is still distended.  Bowel sounds are high pitched.   The abdomen is  tympananic.  Fecal                   Manager in place.                   On night shift he had about 300 cc out of the gastric tube.                    As of 3:30 p.m., there was 250 cc of gastric tube.    Extremities:  No significant edema, cyanosis or clubbing.   Sequential compression devices in place.    Neuro:  Patient attempts to open his eye some during the exam.  At the time of my initial evaluation he was on ketamine at 1 mA be mg per kg prior Precedex at 0.6 micrograms/kilogram prior.      VENTILATOR/SETTINGS:       OET= Size 7.5 mm; taped at 26 cm       Mode:  Assist control       Tidal Volume:  500       Ventilator Rate:  16       FI02:  60% but then decreased down to 55%       PEEP:  Peep decreased 10 cm down to 9 cm and out 8 cm.              AS OF APPROXIMATELY 3:00 P.M., THE PATIENT IS DOING A WEANING TRIAL WITH CPAP 8 CM AND PRESSURE SUPPORT 10  AND 55% OXYGEN.      Gastric Tube:  OROGASTRIC     Tube Feedings:  ON HOLD.     PICC LINE:  LEFT ARM TRIPLE-LUMEN    INTRAVENOUS INFUSIONS: 1. INTRAVENOUS KETAMINE at 1 milligram/kilogram//hour.                                                     2. INTRAVENOUS PRECEDEX THAT WAS AT 0.6 mcg per kg per minute.                                                     3. D5 half-normal saline with 40 mg of potassium running at 100 cc/hour.    FOLEY CATHETER:  In place    PROBLEM LIST:   Active Hospital Problems   (*Primary Problem)    Diagnosis   . *Spleen injury   . Ileus (CMS HCC)   . Leukopenia   . Encephalopathy   . Serum ammonia increased (CMS [place])   . Pleural effusion   . On mechanically assisted ventilation (CMS HCC)   . Multiple rib fractures   . Left pulmonary contusion   . Closed fracture of transverse process of thoracic vertebra (CMS HCC)   . 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 Thrombocytopenia (CMS HCC)   . Alcohol abuse     -  status post left thoracentesis within the past 1.5  to 2 weeks    PLAN:  1.   IV fluids adjusted.  He was on D5  half-normal saline with 20 mg of potassium.  We increased the        potassium to 40 mEq.  He still running at 100 cc// hour.  2.  Tube feedings are still off.  3.  Lactulose enemas.  AMMONIA LEVEL is coming down.  4.  Currently not on any antibiotics.  He did have a minor temperature elevation last night with repeat        cultures and thoughts far nonrevealing.  5.  GI prophylaxis and DVT prophylaxis.  6.   Sedation was decreased and a weaning trial was performed.  7.   On IV Reglan.  8.   Continue to follow radiographs of the chest and abdomen.  Follow the chemistry and CBC closely.        Currently no need for any additional antibiotics.  9.   He had repeat ultrasound imaging of the left hemithorax within the last 3-4 days.  No need for any          Thoracentesis.  10.  I spoke to the patient's lady friend [name]) at 3:45 p.m. and she indicated that she has become his            medical power of attorney.      CRITICAL CARE  TIME IS 50 MINUTES.  __________________________________  Corky Crafts, M.D.      Board Certified in Internal Medicine, Pulmonary, and Sleep Medicine

## 2021-05-18 NOTE — Care Plan (Signed)
Will continue to assess pt and update care plan accordingly, all needs met at this time

## 2021-05-18 NOTE — Care Plan (Signed)
Patient remains on the ventilator. Tolerated CPAP for a short time today. Copious secretions suctioned from ET tube. Will continue to monitor and assess.     Problem: Fall Injury Risk  Goal: Absence of Fall and Fall-Related Injury  Outcome: Ongoing (see interventions/notes)  Intervention: Identify and Manage Contributors  Recent Flowsheet Documentation  Taken 05/18/2021 2000 by Newman Nip, RN  Self-Care Promotion: BADL personal objects within reach  Medication Review/Management:   medications reviewed   infusion titrated  Intervention: Promote Freight forwarder Documentation  Taken 05/18/2021 2000 by Newman Nip, RN  Safety Promotion/Fall Prevention: fall prevention program maintained     Problem: Alcohol Withdrawal  Goal: Alcohol Withdrawal Symptom Control  Outcome: Ongoing (see interventions/notes)     Problem: Acute Neurologic Deterioration (Alcohol Withdrawal)  Goal: Optimal Neurologic Function  Outcome: Ongoing (see interventions/notes)     Problem: Substance Misuse (Alcohol Withdrawal)  Goal: Readiness for Change Identified  Outcome: Ongoing (see interventions/notes)     Problem: Non-violent/Non-Self Destructive Restraints  Goal: Alternative methods tried prior to restraints  Outcome: Ongoing (see interventions/notes)  Goal: Patient free from injury and discomfort  Outcome: Ongoing (see interventions/notes)  Goal: Autonomy maintained at the highest possible level  Outcome: Ongoing (see interventions/notes)  Goal: Need for restraints reassessed per policy  Outcome: Ongoing (see interventions/notes)  Goal: Patient education provided  Outcome: Ongoing (see interventions/notes)  Goal: Problem Interventions  Outcome: Ongoing (see interventions/notes)     Problem: Gas Exchange Impaired  Goal: Optimal Gas Exchange  Outcome: Ongoing (see interventions/notes)  Intervention: Optimize Oxygenation and Ventilation  Recent Flowsheet Documentation  Taken 05/18/2021 2000 by Newman Nip, RN  Head  of Bed [facility]) Positioning: 30 degrees

## 2021-05-19 ENCOUNTER — Inpatient Hospital Stay (HOSPITAL_COMMUNITY): Payer: MEDICAID

## 2021-05-19 ENCOUNTER — Other Ambulatory Visit: Payer: Self-pay

## 2021-05-19 DIAGNOSIS — Z9889 Other specified postprocedural states: Secondary | ICD-10-CM

## 2021-05-19 DIAGNOSIS — K567 Ileus, unspecified: Secondary | ICD-10-CM

## 2021-05-19 HISTORY — DX: Other specified postprocedural states: Z98.890

## 2021-05-19 LAB — CBC
HCT: 27.7 % — ABNORMAL LOW (ref 36.0–46.0)
HGB: 9 g/dL — ABNORMAL LOW (ref 13.9–16.3)
MCH: 34 pg (ref 25.4–34.0)
MCHC: 32.4 g/dL (ref 30.0–37.0)
MCV: 104.8 fL — ABNORMAL HIGH (ref 80.0–100.0)
MPV: 11.2 fL (ref 7.5–11.5)
PLATELETS: 97 10*3/uL — ABNORMAL LOW (ref 130–400)
RBC: 2.64 10*6/uL — ABNORMAL LOW (ref 4.30–5.90)
RDW: 15.2 % — ABNORMAL HIGH (ref 11.5–14.0)
WBC: 5.6 10*3/uL (ref 4.5–11.5)

## 2021-05-19 LAB — ARTERIAL BLOOD GAS, CO-OX, LYTES, LACTATE REFLEX
%FIO2 (ARTERIAL): 50 %
%FIO2 (ARTERIAL): 50 %
(T) PCO2: 30 mm/Hg — ABNORMAL LOW (ref 35.0–45.0)
(T) PO2: 95 mm/Hg (ref 72.0–100.0)
BASE DEFICIT: 4.2 mmol/L — ABNORMAL HIGH (ref 0.0–3.0)
BASE DEFICIT: 4.2 mmol/L — ABNORMAL HIGH (ref 0.0–3.0)
BICARBONATE (ARTERIAL): 21.7 mmol/L (ref 18.0–26.0)
BICARBONATE (ARTERIAL): 21.6 mmol/L (ref 18.0–26.0)
CARBOXYHEMOGLOBIN: 1.8 % (ref 0.0–2.5)
CARBOXYHEMOGLOBIN: 2.1 % (ref 0.0–2.5)
CHLORIDE: 115 mmol/L — ABNORMAL HIGH (ref 101–111)
CHLORIDE: 115 mmol/L — ABNORMAL HIGH (ref 101–111)
GLUCOSE: 95 mg/dL (ref 60–105)
GLUCOSE: 100 mg/dL (ref 60–105)
HEMATOCRITRT: 29 %
HEMATOCRITRT: 31 %
HEMOGLOBIN: 9.8 g/dL — ABNORMAL LOW (ref 12.0–18.0)
HEMOGLOBIN: 10.3 g/dL — ABNORMAL LOW (ref 12.0–18.0)
IONIZED CALCIUM: 1.25 mmol/L (ref 1.10–1.35)
IONIZED CALCIUM: 1.22 mmol/L (ref 1.10–1.35)
LACTATE: 0.6 mmol/L (ref 0.0–1.3)
LACTATE: 0.8 mmol/L (ref 0.0–1.3)
MET-HEMOGLOBIN: 0.6 % (ref 0.0–2.0)
MET-HEMOGLOBIN: 1.3 % (ref 0.0–2.0)
O2CT: 13.5 % — ABNORMAL LOW (ref 15.7–24.3)
O2CT: 13.5 % — ABNORMAL LOW (ref 15.7–24.3)
OXYHEMOGLOBIN: 96.7 % (ref 85.0–98.0)
OXYHEMOGLOBIN: 92.8 % (ref 85.0–98.0)
PAO2/FIO2 RATIO: 190 (ref <=200)
PAO2/FIO2 RATIO: 140 (ref <=200)
PCO2 (ARTERIAL): 30 mm/Hg — ABNORMAL LOW (ref 35–45)
PCO2 (ARTERIAL): 29 mm/Hg — ABNORMAL LOW (ref 35–45)
PH (ARTERIAL): 7.42 (ref 7.35–7.45)
PH (ARTERIAL): 7.43 (ref 7.35–7.45)
PH (T): 7.42 (ref 7.35–7.45)
PO2 (ARTERIAL): 95 mm/Hg (ref 72–100)
PO2 (ARTERIAL): 70 mm/Hg — ABNORMAL LOW (ref 72–100)
SODIUM: 143 mmol/L (ref 137–145)
SODIUM: 142 mmol/L (ref 137–145)
TEMPERATURE, COMP: 37 C
WHOLE BLOOD POTASSIUM: 4.4 mmol/L (ref 3.5–4.6)
WHOLE BLOOD POTASSIUM: 4.3 mmol/L (ref 3.5–4.6)

## 2021-05-19 LAB — BASIC METABOLIC PANEL
ANION GAP: 3 mmol/L — ABNORMAL LOW (ref 5–19)
BUN/CREA RATIO: 21 — ABNORMAL HIGH (ref 6–20)
BUN: 13 mg/dL (ref 9–20)
CALCIUM: 7.9 mg/dL — ABNORMAL LOW (ref 8.4–10.2)
CHLORIDE: 120 mmol/L — ABNORMAL HIGH (ref 98–107)
CO2 TOTAL: 20 mmol/L — ABNORMAL LOW (ref 22–30)
CREATININE: 0.61 mg/dL — ABNORMAL LOW (ref 0.66–1.20)
ESTIMATED GFR: >60 mL/min/{1.73_m2} (ref >60)
GLUCOSE: 104 mg/dL (ref 74–106)
POTASSIUM: 4.2 mmol/L (ref 3.5–5.1)
SODIUM: 143 mmol/L (ref 137–145)

## 2021-05-19 LAB — MAGNESIUM: MAGNESIUM: 2.3 mg/dL (ref 1.6–2.3)

## 2021-05-19 LAB — AMMONIA: AMMONIA: 61 umol/L — ABNORMAL HIGH (ref 9–30)

## 2021-05-19 LAB — PHOSPHORUS: PHOSPHORUS: 3.6 mg/dL (ref 2.5–4.5)

## 2021-05-19 MED ORDER — DEXTROSE 5 % AND 0.45 % SODIUM CHLORIDE INTRAVENOUS SOLUTION
INTRAVENOUS | Status: DC
Start: 2021-05-19 — End: 2021-05-23
  Filled 2021-05-19 (×12): qty 1000

## 2021-05-19 NOTE — Progress Notes (Addendum)
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 prior study.      Impression    Stable examination compared to 05/02/2021.      Radiologist location ID: [id number]     XR AP MOBILE CHEST     Status: None    Narrative    Hatim LEE Wurtzel    RADIOLOGIST: Otho Najjar, MD    XR AP MOBILE CHEST performed on 05/02/2021 2:30 PM    CLINICAL HISTORY: PICC line placement check.  picc line insertion    TECHNIQUE: Frontal view of the chest.    COMPARISON:  Today, 9:21 AM    FINDINGS:  The support lines and tubes are in stable and satisfactory position. New PICC line tip is in the SVC. No pneumothorax.   Heart size is moderately enlarged.     There is diffuse left-sided infiltrate. There is right lower lobe infiltrate/effusion. These findings are stable. There could be a CHF component.      Impression    No interval change from 05/02/2021 in the lung fields.      Radiologist location ID: [id number]     XR AP MOBILE CHEST     Status: None    Narrative    Cordarrel LEE Hughlett    RADIOLOGIST: Dorisann Frames, MD    XR AP MOBILE CHEST performed on 05/03/2021 7:01 AM    CLINICAL HISTORY: Trauma.  resp failure   vent    TECHNIQUE: Frontal view of the chest.    COMPARISON:  Yesterday    FINDINGS:  The support lines and tubes are in stable and satisfactory position.   Cardiac and  mediastinal contours are stable.   No significant change in the appearance of the lungs.           Impression    NO SIGNIFICANT CHANGE SINCE THE PRIOR EXAM.      Radiologist location ID: [id number]     CT CHEST ABDOMEN PELVIS W IV CONTRAST  Status: None    Narrative    Henri LEE Walt    RADIOLOGIST: Peterson Ao    CT CHEST ABDOMEN PELVIS W IV CONTRAST performed on 05/03/2021 10:02 AM    CLINICAL HISTORY: Reassess grade 3 splenic laceration and rib fractures/pneumothorax.  increasing respiratory distress    TECHNIQUE:  Chest, abdomen and pelvis CT with intravenous contrast.  CONTRAST:  75 ml's of Isovue 300    COMPARISON:  None.  # of known CTs in the past 12 months: 0   # of known Cardiac Nuclear Medicine Studies in the past 12 months: 0    FINDINGS:    Image quality is degraded secondary to respiratory motion artifact.    CT CHEST:  Hardware:  Endotracheal tube in satisfactory position. Transesophageal catheter terminates in the stomach.    Lymph nodes:   No mediastinal, hilar, or axillary lymphadenopathy.    Heart and Vasculature:  Cardiomegaly.  No pericardial effusion. Thoracic aorta and pulmonary arteries are unremarkable.      Lungs and Airways:  Worsening bilateral lower lobe consolidation. Mild underlying emphysematous changes.    Pleura: Trace left pleural effusion. Interval resolution of the previously seen small left-sided pneumothorax.    Bones: Redemonstration of multiple displaced left-sided rib fractures. Interval decrease in the subcutaneous air in the left chest wall.      CT ABDOMEN/PELVIS:  Liver:   Nodular surface contours with hypertrophy of the caudate lobe compatible with cirrhosis    Gallbladder:   Cholelithiasis    Spleen:   The known splenic laceration is not as well visualized on the current exam. No abnormal perisplenic fluid collection is seen.    Pancreas:   Unremarkable.    Adrenals:   Unremarkable.    Kidneys:   Unremarkable.    Bladder:  Decompressed with an indwelling  Foley catheter    Prostate:  Unremarkable.    Bowel:   No bowel obstruction.    Appendix:  Not visualized    Lymph nodes:  No suspicious lymph node enlargement.    Vasculature:   Mild diffuse atherosclerotic calcifications are noted.     Peritoneum / Retroperitoneum: No ascites.  No free air.    Bones:   Degenerative changes of the spine.          Impression    RESPIRATORY MOTION DEGRADED EXAM.    INTERVAL RESOLUTION OF THE PREVIOUSLY SEEN SMALL LEFT-SIDED PNEUMOTHORAX.    WORSENING BILATERAL LOWER LOBE CONSOLIDATION THAT COULD REFLECT ATELECTASIS AND/OR PNEUMONIA    REDEMONSTRATION OF MULTIPLE DISPLACED LEFT-SIDED RIB FRACTURES WITH DECREASING SUBCUTANEOUS AIR IN THE CHEST WALL.    THE KNOWN GRADE 3 SPLENIC LACERATION IS NOT AS WELL VISUALIZED ON THE CURRENT EXAM AND IS STABLE TO IMPROVED FROM THE 05/01/2021 EXAM. NO NEW ACUTE FINDINGS IN THE ABDOMEN/PELVIS.      One or more dose reduction techniques were used (e.g., Automated exposure control, adjustment of the mA and/or kV according to patient size, use of iterative reconstruction technique).      Radiologist location ID: [id number]     XR AP MOBILE CHEST     Status: None    Narrative    Leanthony LEE Deisher    RADIOLOGIST: Betsey Amen, MD    XR AP MOBILE CHEST performed on 05/04/2021 7:03 AM    CLINICAL HISTORY: Trauma.  resp fail-vent    TECHNIQUE: Frontal view of the chest.    COMPARISON:  05/03/2021    FINDINGS:  Endotracheal tube in place whose tip  is 3.9 cm from the carina. Nasogastric tube in place terminating over the stomach. Left arm PICC in place whose tip is in the lower SVC.   Heart size is moderately enlarged.     Small bilateral pleural effusions similar prior exam. Bibasilar atelectasis/pneumonia. No pneumothorax nor vascular congestion. No significant change.           Impression    No significant change.      Radiologist location ID: [id number]     XR AP MOBILE CHEST     Status: None    Narrative    Garrie LEE Tenbrink    RADIOLOGIST: Diana Eves, MD    XR AP MOBILE CHEST performed on 05/05/2021 6:31 AM    CLINICAL HISTORY: Trauma.  trauma/vent    TECHNIQUE: Frontal view of the chest.    COMPARISON:  Yesterday    FINDINGS:  Support tubes and lines are unchanged.   There is stable mild enlargement of the cardiac silhouette. The pulmonary vasculature is within normal limits.   There are persistent small bilateral pleural effusions with associated bibasilar atelectasis/infiltrate. These findings have improved compared to prior study.           Impression    As above      Radiologist location ID: [id number]     XR AP MOBILE CHEST     Status: None    Narrative    Mia LEE Mobley    RADIOLOGIST: Ileene Hutchinson    XR AP MOBILE CHEST performed on 05/05/2021 1:14 PM    CLINICAL HISTORY: Post Bronchoscopy.  vent     TECHNIQUE: Frontal view of the chest.    COMPARISON:  Chest radiograph dated 05/05/2018    FINDINGS:  The endotracheal tube terminates 5 cm above the carina. An enteric tube courses into the stomach. A left upper extremity PICC terminates near the superior cavoatrial junction.  The heart size is normal.   Lung volumes are low with hazy bibasilar airspace opacities. There is no pneumothorax.   The bones are unremarkable.        Impression    1.Bibasilar atelectasis or pneumonia/aspiration  2.No pneumothorax  3.Endotracheal tube terminating 5 cm above the carina      Radiologist location ID: [id number]     XR AP MOBILE CHEST     Status: None    Narrative    Carlos LEE Bach    RADIOLOGIST: Berton Bon, MD    XR AP MOBILE CHEST performed on 05/06/2021 6:32 AM    CLINICAL HISTORY: Trauma.  trauma/vent/resp failure    TECHNIQUE: Frontal view of the chest.    COMPARISON:  Yesterday    FINDINGS:  ET tube 3 cm above the carina. There is an NG tube in the stomach. There is a left PICC catheter in the SVC.   Heart size is moderately enlarged.     There are bilateral perihilar infiltrates   Mild right dorsal scoliosis        Impression    Persistent perihilar  infiltrates/vascular congestion slightly worse on the left since yesterday      Radiologist location ID: [id number]     XR AP MOBILE CHEST     Status: None    Narrative    Doc LEE Brault    RADIOLOGIST: Dorisann Frames, MD    XR AP MOBILE CHEST performed on 05/07/2021 6:11 AM    CLINICAL HISTORY: Trauma.  on vent     TECHNIQUE: Frontal view of the  chest.    COMPARISON:  Yesterday    FINDINGS:  The support lines and tubes are in stable and satisfactory position.   Cardiac and mediastinal contours are stable.   No significant change in the appearance of the lungs.   Extensive bilateral pulmonary infiltrates persist with a moderate size left pleural effusion.        Impression    NO SIGNIFICANT CHANGE SINCE THE PRIOR EXAM.      Radiologist location ID: [id number]     [place] CHEST (EFFUSION)     Status: None    Narrative    Camdan LEE Huseman    RADIOLOGIST: Colletta Maryland, MD    [place] CHEST (EFFUSION) performed on 05/07/2021 11:17 AM    CLINICAL HISTORY: pleural effusions..  check left pleural effusion    TECHNIQUE:  Ultrasound imaging of left chest.    COMPARISON:  None.    FINDINGS:  There is a moderate-sized left pleural effusion.        Impression    LEFT PLEURAL EFFUSION        Radiologist location ID: [id number]     US THORACENTESIS     Status: None    Narrative    *Procedure not read by radiology.    *Please Refer to Procedure Note for result.   XR AP MOBILE CHEST     Status: None    Narrative    Rodgers LEE Highfill    RADIOLOGIST: Berton Bon, MD    XR AP MOBILE CHEST performed on 05/07/2021 4:40 PM    CLINICAL HISTORY: POST THORACENTESIS.  s/p left thoracentisis.     TECHNIQUE: Frontal view of the chest.    COMPARISON:  Previous exam from today    FINDINGS:  ET tube 5 cm above the carina. There is an NG tube in the stomach. There is a left PICC catheter in the SVC   Heart size is mildly enlarged.     There are perihilar and basilar infiltrates. Possible small effusions obscuring the diaphragms   There are multiple left  lateral rib fracture deformities        Impression    Negative for pneumothorax after thoracentesis      Radiologist location ID: [id number]     XR AP MOBILE CHEST     Status: None    Narrative    Oley LEE Hadlock    RADIOLOGIST: Otho Najjar, MD    XR AP MOBILE CHEST performed on 05/09/2021 6:15 AM    CLINICAL HISTORY: Mult. L rib fx.s, effusion, intubated.  resp fail-vent, effusion, multiple rib fx's    TECHNIQUE: Frontal view of the chest.    COMPARISON:  05/07/2021    FINDINGS:  The support lines and tubes are in stable and satisfactory position. No pneumothorax.     There is increasing opacity bilaterally in the mid lung fields with infiltrate, effusion and vascular congestion. This could be inflammatory but the symmetry suggests development of pulmonary edema. Clinical correlation recommended.      Impression    Increasing opacity bilaterally suggesting increasing CHF. Clinical correlation recommended.      Radiologist location ID: [id number]     XR AP MOBILE CHEST     Status: None    Narrative    Jermar LEE Wescott    RADIOLOGIST: Colletta Maryland, MD    XR AP MOBILE CHEST performed on 05/10/2021 6:19 AM    CLINICAL HISTORY: respiratory failure.  resp fail-vent    TECHNIQUE: Frontal view  of the chest.    COMPARISON:  05/09/2021    FINDINGS:  The support lines and tubes are in stable and satisfactory position.   Cardiac and mediastinal contours are stable.   Airspace disease is present in the mid to lower lungs with probable bilateral pleural effusions. Lung volumes are slightly improved since yesterday           Impression    BILATERAL AIRSPACE DISEASE AND PLEURAL EFFUSIONS WITH SOME IMPROVEMENT SINCE YESTERDAY      Radiologist location ID: [id number]     CT ANGIO CHEST FOR PULMONARY EMBOLUS W IV CONTRAST     Status: None    Narrative    Shae LEE Stepter    RADIOLOGIST: Colletta Maryland, MD    CT ANGIO CHEST FOR PULMONARY EMBOLUS W IV CONTRAST performed on 05/10/2021 11:49 AM    CLINICAL HISTORY: hypoxia.  on the Vent  , increased hypoxia. CXR showed increased opacities  Increase in CHF  Lung sounds coarse  . fall on 05-01-21    TECHNIQUE: CTA imaging of the chest with intravenous contrast.  3D reconstructions.  CONTRAST:  100 ml's of Isovue 370    COMPARISON: 05/03/2021  # of known CTs in the past 12 months: 7   # of known Cardiac Nuclear Medicine Studies in the past 12 months: 0         FINDINGS:  Hardware:  There is an NG tube in the stomach. An ET tube is present in the trachea. There is a left-sided PICC line in the SVC.    Lymph nodes:   No mediastinal, hilar, or axillary lymphadenopathy.    Heart:  Coronary artery calcifications are noted.        RV/LV Diameter Ratio: N/A    Thoracic Aorta:  No thoracic aortic aneurysm or dissection.    Pulmonary Vessels:  No evidence of acute pulmonary emboli through the major subsegmental branches.    Lungs and Airways:  There is patient respiratory motion artifact which limits evaluation of the lungs.      Bilateral lower lobe airspace disease with air bronchograms. Pneumonia versus atelectasis.      Pleura: Bilateral pleural effusions left larger than right    Upper Abdomen: Cirrhosis with splenomegaly. Small volume of ascites. Probable cholelithiasis.    Bones: Bone windows are unremarkable.        Impression    1.NO PULMONARY EMBOLUS  2.BILATERAL PLEURAL EFFUSIONS  3.BILATERAL LOWER LOBE AIRSPACE DISEASE. PNEUMONIA VERSUS ATELECTASIS      One or more dose reduction techniques were used (e.g., Automated exposure control, adjustment of the mA and/or kV according to patient size, use of iterative reconstruction technique).      Radiologist location ID: [id number]     XR AP MOBILE CHEST     Status: None    Narrative    Obe LEE Moffit    RADIOLOGIST: Colletta Maryland, MD    XR AP MOBILE CHEST performed on 05/11/2021 5:35 AM    CLINICAL HISTORY: respiratory failure.  resp fail-vent    TECHNIQUE: Frontal view of the chest.    COMPARISON:  Yesterday    FINDINGS:  The support lines and tubes are  in stable and satisfactory position.   Cardiac and mediastinal contours are stable.   Bilateral airspace disease with bilateral pleural effusions similar to yesterday           Impression    NO ACUTE FINDINGS.      Radiologist location ID: [id number]  CT BRAIN WO IV CONTRAST     Status: None    Narrative    Rebecca LEE Torosian    RADIOLOGIST: Colletta Maryland, MD    CT BRAIN WO IV CONTRAST performed on 05/12/2021 2:27 AM    CLINICAL HISTORY: altered mental status.  Pt intubated and per RN very restless this early AM    TECHNIQUE:  Head CT without intravenous contrast.    COMPARISON: 05/01/2021  # of known CTs in the past 12 months: 8   # of known Cardiac Nuclear Medicine Studies in the past 12 months: 0    FINDINGS: This study is degraded by patient motion artifact  There is no acute intracranial hemorrhage, mass effect, or evidence of large acute infarct.    Brain: Low density in the periventricular white matter suggests mild chronic small vessel ischemic changes.    CSF Spaces: Mild generalized cerebral atrophy     Sinuses/Mastoids:  Clear at visualized levels     Bones: Unremarkable        Impression    CHRONIC CHANGES.  NO ACUTE FINDINGS.       One or more dose reduction techniques were used (e.g., Automated exposure control, adjustment of the mA and/or kV according to patient size, use of iterative reconstruction technique).      Radiologist location ID: [id number]     XR AP MOBILE CHEST     Status: None    Narrative    Aman LEE Adeyemi    RADIOLOGIST: Colletta Maryland, MD    XR AP MOBILE CHEST performed on 05/12/2021 6:20 AM    CLINICAL HISTORY: respiratory failure.  vent management     TECHNIQUE: Frontal view of the chest.    COMPARISON:  Yesterday    FINDINGS:  The support lines and tubes are in stable and satisfactory position.   Cardiac and mediastinal contours are stable.   There is a left pleural effusion and bilateral airspace disease with possible right pleural effusion. Mild improvement in the right lung  since yesterday.           Impression    BILATERAL AIRSPACE DISEASE AND PLEURAL EFFUSIONS LEFT WORSE      Radiologist location ID: [id number]     XR AP MOBILE CHEST     Status: None    Narrative    Sora LEE Oviatt    RADIOLOGIST: Colletta Maryland, MD    XR AP MOBILE CHEST performed on 05/13/2021 8:23 AM    CLINICAL HISTORY: respiratory failure, PNA.  resp.failure, PNA, vent    TECHNIQUE: Frontal view of the chest.    COMPARISON:  Yesterday    FINDINGS:  The support lines and tubes are in stable and satisfactory position.   Cardiac and mediastinal contours are stable.   There is a left pleural effusion unchanged from yesterday. The right lung is improved since yesterday with improved lung volumes.           Impression    PERSISTENT LEFT PLEURAL EFFUSION      Radiologist location ID: [id number]     XR AP MOBILE CHEST     Status: None    Narrative    Rondel LEE Markgraf    RADIOLOGIST: Berton Bon, MD    XR AP MOBILE CHEST performed on 05/14/2021 6:07 AM    CLINICAL HISTORY: Pleural effusions.  vent management. pleural effusion    TECHNIQUE: Frontal view of the chest.    COMPARISON:  Yesterday    FINDINGS:  ET tube 4.8 cm above the carina.. There is an NG tube extending at least into the distal esophagus and obscured over the upper abdomen. There is a left PICC catheter in the SVC   Heart size is moderately enlarged.     There are perihilar infiltrates with possible effusions obscuring the diaphragms especially on the left           Impression    Stable infiltrates/vascular congestion with probable effusions      Radiologist location ID: [id number]     XR AP MOBILE CHEST     Status: None    Narrative    Michio LEE Wing    RADIOLOGIST: Brayton El, MD    XR AP MOBILE CHEST performed on 05/15/2021 6:04 AM    CLINICAL HISTORY: Pleural effusions.      vent management     TECHNIQUE: Frontal view of the chest.    COMPARISON:  05/13/2021    FINDINGS:  A left PICC ends in the upper SVC. The endotracheal tube ends 4 cm  above the carina. The enteric tube ends below the diaphragm.     The heart size is normal.     There is improving aeration of both lungs most notably at both lung bases. A trace left pleural effusion persists.         Impression    Improving aeration of both lungs. The left pleural effusion appears to have decreased in size.       Radiologist location ID: [id number]     XR ABD SUPINE     Status: None    Narrative    Cornellius LEE Berhow    RADIOLOGIST: Brayton El, MD    XR ABD SUPINE performed on 05/15/2021 1:02 PM.    CLINICAL HISTORY: r/o ileus - abdominal distention.      ileus    TECHNIQUE: Single view abdomen.    COMPARISON:  None.      FINDINGS:  There is a diffuse gaseous distention of the small bowel. There is also gas within the cecum suggesting this could be an ileus.     No suspicious calcifications.    The bones are unremarkable.        Impression    Diffuse gaseous distention in a pattern suggestive of ileus. The enteric tube ends in the stomach.        Radiologist location ID: [id number]     MRI BRAIN W/WO CONTRAST     Status: None    Narrative    Bryann LEE Arseneau    RADIOLOGIST: Betsey Amen, MD    MRI BRAIN W/WO CONTRAST performed on 05/15/2021 10:34 PM    CLINICAL HISTORY: inconsistant neuro exam.  Trauma, splenic laceration.  Unable to wake pt calmy since sedated.    TECHNIQUE: Routine brain MRI without and with intravenous contrast.   INTRAVENOUS CONTRAST: 20 ml's of Dotarem    COMPARISON:  None.    FINDINGS:  Brain: Normal signal intensities.    Diffusion weighted images show no evidence of acute or recent infarct.   Postcontrast images show no suspicious enhancement.    Ventricles: Normal.    Major Intracranial Vessels: Normal flow voids.    Sinuses: Clear.  Mastoids: Small amount of fluid signal is identified within both mastoid air cells.          Impression    No acute finding.        Radiologist location ID: [id number]     XR  AP MOBILE CHEST     Status: None    Narrative    Gery LEE  Bungert    RADIOLOGIST: Berton Bon, MD    XR AP MOBILE CHEST performed on 05/16/2021 6:55 AM    CLINICAL HISTORY: Pleural effusions.  RESP FAIL-VENT    TECHNIQUE: Frontal view of the chest.    COMPARISON:  Yesterday    FINDINGS:  ET tube 4.7 cm above the carina. There is an NG tube extending towards the stomach. The tip is not included on this film. There is a left PICC catheter in the SVC   Heart size is moderately enlarged.     There are bilateral perihilar and infrahilar infiltrates with obscuration of the left diaphragm           Impression    Slightly increased infiltrates/vascular congestion      Radiologist location ID: [id number]     XR KUB     Status: None    Narrative    Javares LEE Kijowski    RADIOLOGIST: Berton Bon, MD    XR KUB performed on 05/16/2021 9:31 AM.    CLINICAL HISTORY: ileus.  evaluate ileus - abdominal distention    TECHNIQUE: Single view abdomen.    COMPARISON:  Yesterday    FINDINGS:  There is gaseous gastric distention. There is some mild diffuse gaseous small bowel distention. Some air is also suggested in the right colon    No suspicious calcifications.    There is lumbar spurring  There is an NG tube with the tip of the proximal stomach         Impression    1.Persistent gaseous intestinal distention suggesting an ileus. There is increased gastric distention since yesterday        Radiologist location ID: [id number]     [place] CHEST (EFFUSION)     Status: None    Narrative    Quincy LEE Cavness    RADIOLOGIST: Brian Schambach    [place] CHEST (EFFUSION) performed on 05/16/2021 2:28 PM    CLINICAL HISTORY: evaluate for thoracentesis.  bilateral chest     COMPARISON: None.    FINDINGS:  Ultrasound was used to evaluate for pleural effusions.    There is a small amount of perihepatic ascites. There is a trace right effusion. No left pleural fluid is seen.       Impression    As above.        Radiologist location ID: [id number]     XR AP MOBILE CHEST     Status: None    Narrative    Tymothy LEE Bartosiewicz    RADIOLOGIST:  Danne Baxter, MD    XR AP MOBILE CHEST performed on 05/17/2021 6:07 AM    CLINICAL HISTORY: respiratory failure.    chest trauma.   fractured ribs.  RESP FAIL-VENT    TECHNIQUE: Frontal view of the chest.    COMPARISON:  Yesterday    FINDINGS:  The support lines and tubes are in stable and satisfactory position.   The heart size is normal.   There is some stable mild opacity at both lung bases.   There is a stable small left pleural effusion.        Impression    NO SIGNIFICANT CHANGE SINCE THE PRIOR EXAM.      Radiologist location ID: [id number]     XR KUB     Status: None    Narrative    Alfreddie LEE Manzi  RADIOLOGIST: Danne Baxter, MD    XR KUB performed on 05/17/2021 8:25 AM.    CLINICAL HISTORY: ileus.  evaluate ileus    TECHNIQUE: Single view abdomen.    COMPARISON:  05/16/2021    FINDINGS:  There are markedly distended loops of small bowel with a paucity of air in colon. Findings indicate a small bowel obstruction. Gaseous distention of the stomach has decreased from the prior exam.    A nasogastric tube is present in the region of the stomach.    There are degenerative changes of the spine.        Impression    PERSISTENT MARKEDLY DISTENDED LOOPS OF SMALL BOWEL SUGGESTING AN OBSTRUCTION.        Radiologist location ID: [id number]     XR AP MOBILE CHEST     Status: None    Narrative    Gray LEE Mcgeachy    RADIOLOGIST: Ileene Hutchinson    XR AP MOBILE CHEST performed on 05/18/2021 6:50 AM    CLINICAL HISTORY: Chest pain/SOB, Left pleural effusion.  resp fail-vent    TECHNIQUE: Frontal view of the chest.    COMPARISON:  Yesterday    FINDINGS:  The support lines and tubes are in stable and satisfactory position.   The heart size is normal.   Bibasilar opacities and a small left pleural effusion are unchanged.   The bones are unremarkable.        Impression    NO SIGNIFICANT CHANGE SINCE THE PRIOR EXAM.      Radiologist location ID: [id number]     XR KUB     Status: None    Narrative    Archie LEE  Spiegelman    RADIOLOGIST: Brayton El, MD    XR KUB performed on 05/18/2021 10:21 AM.    CLINICAL HISTORY: f/u ileus.      ileus/vent    TECHNIQUE: Single view abdomen.    COMPARISON:  05/17/2021      FINDINGS:  There is diffuse gaseous distention of the small bowel and colon potentially representing ileus. An enteric tube is seen in the stomach.    There is a Foley catheter.    No suspicious calcifications.    Bibasilar atelectasis is present. Mild cardiomegaly.           Impression    Diffuse gaseous distention of the bowel suggestive of an ileus.        Radiologist location ID: [id number]     XR KUB     Status: None    Narrative    Rosbel LEE Mossbarger    RADIOLOGIST: Danne Baxter, MD    XR KUB performed on 05/19/2021 6:19 AM.    CLINICAL HISTORY: f/u ileus.  re evaluate ileus     TECHNIQUE: Single view abdomen.    COMPARISON:  Yesterday    FINDINGS:  There is a nasogastric tube projecting in the region of the distal body of the stomach.    There are distended gas-filled loops of small bowel with a similar degree of distention. Obstruction is suspected. Ileus is in differential.    There are degenerative changes of the spine.        Impression    NO SIGNIFICANT CHANGE FROM THE PRIOR STUDY.        Radiologist location ID: [id number]     XR AP MOBILE CHEST     Status: None    Narrative    Shakai LEE Gleghorn    RADIOLOGIST: Randall Hiss  Oletta Cohn, MD    XR AP MOBILE CHEST performed on 05/19/2021 6:20 AM    CLINICAL HISTORY: Chest pain/SOB, Left pleural effusion.  on vent     TECHNIQUE: Frontal view of the chest.    COMPARISON:  Yesterday    FINDINGS:  The support lines and tubes are in stable and satisfactory position.   The heart size is normal.   There is patchy by airspace disease with a layering left pleural effusion. Aeration is similar.           Impression    NO SIGNIFICANT CHANGE SINCE THE PRIOR EXAM.      Radiologist location ID: [id number]        All current radiology studies were reviewed.    PHYSICAL  EXAMINATION  Temperature: 37.5 C (99.5 F)  Heart Rate: 60  BP (Non-Invasive): (!) 97/54  Respiratory Rate: (!) 26  SpO2: 98 %    General:  No obvious discomfort.  No signs of respiratory distress.    HEENT:  Normocephalic.  Oral endotracheal tube and orogastric tube in place.  Sclera are nonicteric.    Neck:  No obvious JVD.    Heart:  S1, S2.  No murmurs    Lungs:  Breath sounds are clear bilaterally;  slightly decreased at the bases left greater than right.    Abdomen:  Abdomen is still distended but not as firm as it was.  There are some bowel sounds present.  He still receiving the lactulose enemas.    Extremities:  No significant edema.  No cyanosis or clubbing    Neuro:  Nonfocal.  Responding to questions appropriately.      VENTILATOR/SETTINGS:     OET= Size 7.5 mm; taped at 26 cm       Mode:  Assist control       Tidal Volume:  500       Ventilator Rate:  16       FI02: 50but then decreased down to 55%       PEEP:  Peep decreased 10 cm down to 9 cm and out 8 cm.                Orogasttric Tube:  OROGASTRIC     Tube Feedings:  ON HOLD.     PICC LINE:  LEFT ARM    INTRAVENOUS INFUSIONS: 1.  PRECEDEX AND KETAMINE DISCONTINUED.    FOLEY CATHETER:  IN PLACE.   LENGTH OF STAY INPUT AND OUTPUT SHOW A NET NEGATIVE                                                            OF  - 9.8 L.    PROBLEM LIST:   Active Hospital Problems   (*Primary Problem)    Diagnosis   . *Spleen injury   . Status post thoracentesis   . Ileus (CMS HCC)   . Leukopenia   . Encephalopathy   . Serum ammonia increased (CMS [facility])   . Pleural effusion   . On mechanically assisted ventilation (CMS HCC)   . Multiple rib fractures   . Left pulmonary contusion   . Closed fracture of transverse process of thoracic vertebra (CMS HCC)   . 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 Thrombocytopenia (CMS  [facility])   . Alcohol abuse     -  EXTUBATED ON 05/19/2021    PLAN:  1. Patient was weaned today down to 50% T-piece and then he was re-evaluated with  parameters and blood gas and then extubated..    Patient placed on an Airvo.  2.  Continue orogastric tube.  3.  Continue lactulose enemas.  4.  GI prophylaxis and DVT prophylaxis.  5.   Chest x-ray, CBC and chemistry in the morning.  6.   Continue bowel regimen.  7.   Continue IV fluid.  8.   Continue bowel regimen and also continue Reglan.  9.   I SPOKE TO DR. Gale Journey PRIOR TO THE EXTUBATION.      CRITICAL CARE TIME IS 45 MINUTES.  Corky Crafts, M.D.      Board Certified in Internal Medicine, Pulmonary, and Sleep Medicine

## 2021-05-19 NOTE — Progress Notes (Signed)
Name: Hector [name] MRN:  [id number]   Date of admission: 05/01/2021 Age: 60 y.o.       Name - Hector [name]    Date of service - 05/19/2021      Interval history  No acute events overnight.  He remains intubated in the ICU.  Patient has been undergoing spontaneous breathing trials.  Peep has been weaned down.  Agitation is still an issue.  Clinically the patient is less distended so I do think the ileus is improving.  He is having bowel movements.  Hemodynamically stable    Physical exam  Filed Vitals:    05/19/21 0530 05/19/21 0600 05/19/21 0630 05/19/21 0700   BP: 113/61 133/65 117/62 (!) 109/59   Pulse: 64 66 66 65   Resp:       Temp:       SpO2: 98% 96% 97% 97%          Intake/Output Summary (Last 24 hours) at 05/19/2021 0939  Last data filed at 05/19/2021 0600  Gross per 24 hour   Intake 1780.71 ml   Output 3400 ml   Net -1619.29 ml        General:Intubated in the ICU with sedation,patient is on fentanylprn,ketamine and Precedex.Marland KitchenMarland KitchenHe was resting comfortably when I examined him this morning  Abdomen: soft, nontender, small umbilical hernia,very minimal distension  Skin: No jaundice, lesions, or rashes.  Lungs:On the ventilator   CV: Hemodynamically stable  Extremities:no significant edema      Labs/Imaging -     XR KUB performed on 05/19/2021 6:19 AM.    CLINICAL HISTORY: f/u ileus.  re evaluate ileus     TECHNIQUE: Single view abdomen.    COMPARISON:  Yesterday    FINDINGS:  There is a nasogastric tube projecting in the region of the distal body of the stomach.    There are distended gas-filled loops of small bowel with a similar degree of distention. Obstruction is suspected. Ileus is in differential.    There are degenerative changes of the spine.      IMPRESSION:  NO SIGNIFICANT CHANGE FROM THE PRIOR STUDY.        XR AP MOBILE CHEST performed on 05/19/2021 6:20 AM    CLINICAL HISTORY: Chest pain/SOB, Left pleural effusion.  on vent     TECHNIQUE: Frontal view of the  chest.    COMPARISON:  Yesterday    FINDINGS:  The support lines and tubes are in stable and satisfactory position.   The heart size is normal.   There is patchy by airspace disease with a layering left pleural effusion. Aeration is similar.         IMPRESSION:  NO SIGNIFICANT CHANGE SINCE THE PRIOR EXAM.    ASSESSMENT & RECOMMENDATIONS:   60 y.o.malestatus post fall downstairs sustaining multiple left-sided rib fractures with subcutaneous emphysema,transverse process fracture of thoracic spine,and grade 3 splenic laceration  -continueweaning the patient from the ventilator...  Hopefully patient can be extubated since  -the patient continues to receive Flagyl is for increased ammonia levels  -Lovenox for DVT prophylaxis  -the patient has developed an ileus.Marland KitchenMarland KitchenThe patient's ileus is likely due to inactivity,electrolyte abnormalities and medication related  -appreciate the assistance of the ICU team    Clinton Quant, MD      Note: A portion of this documentation was generated using MMODAL (voice recognition software) and may contain syntax/voice recognition errors.

## 2021-05-19 NOTE — Care Plan (Signed)
Problem: Adult Inpatient Plan of Care  Goal: Absence of Hospital-Acquired Illness or Injury  Intervention: Identify and Manage Fall Risk  Recent Flowsheet Documentation  Taken 05/19/2021 0800 by Ola Spurr, RN  Safety Promotion/Fall Prevention: fall prevention program maintained  Intervention: Prevent Skin Injury  Recent Flowsheet Documentation  Taken 05/19/2021 0800 by Ola Spurr, RN  Body Position: supine  Intervention: Prevent and Manage VTE (Venous Thromboembolism) Risk  Recent Flowsheet Documentation  Taken 05/19/2021 0800 by Ola Spurr, RN  VTE Prevention/Management:   sequential compression devices on   anticoagulant therapy maintained  Intervention: Prevent Infection  Recent Flowsheet Documentation  Taken 05/19/2021 0800 by Ola Spurr, RN  Infection Prevention: equipment surfaces disinfected  Goal: Optimal Comfort and Wellbeing  Intervention: Provide Person-Centered Care  Recent Flowsheet Documentation  Taken 05/19/2021 0800 by Ola Spurr, RN  Trust Relationship/Rapport: care explained

## 2021-05-19 NOTE — Progress Notes (Signed)
Pt is SBIRT positive for ETOH. Pt is still intubated. Peers will continue to check in on pt to attempt a BI and offer resources when pt is not intubated. Follow up on 05-21-21

## 2021-05-19 NOTE — Consults (Signed)
[facility]    Neurology Follow up Note    05/18/2021      Name:  Hector [name], Hector [name]  Age: 60 y.o.  Gender: male  Date of Admission:  05/01/2021  Date of Birth:  09-07-1961         PCP: No Pcp  Consulting physician: Clinton Quant, MD    Chief Complaint:  Encephalopathy  Reason for the consult:  Encephalopathy    Subjective:  The patient is able to open his eyes to name.  He continues to remain intubated and sedated mainly for agitation.    Review of Systems:  Constitutional: Denies weight loss, fever and chills.  HEENT: Denies changes in vision and hearing.  Respiratory: Denies SOB and cough  CV: Denies palpitations and CP.    Past Medical History:  Past Medical History:   Diagnosis Date    Alcohol abuse     Cirrhosis (CMS HCC)     Delirium, withdrawal, alcoholic (CMS HCC)     HTN (hypertension)     Unknown cause of injury     fx nose    Wears glasses          Past Surgical History:  Past Surgical History:   Procedure Laterality Date    HX TONSILLECTOMY         Past Social Hisory:  Social History     Socioeconomic History    Marital status: Divorced   Tobacco Use    Smoking status: Current Every Day Smoker     Packs/day: 1.50    Smokeless tobacco: Never Used   Brewing technologist Use: Never used   Substance and Sexual Activity    Alcohol use: Yes     Alcohol/week: 15.0 standard drinks     Types: 15 Cans of beer per week     Comment: daily    Drug use: Yes     Types: Marijuana     Comment: daily    Sexual activity: Yes        Past Family History:  Family Medical History:     Problem Relation (Age of Onset)    Asthma Father          Allergies:  Allergies   Allergen Reactions    Bee Venom Protein (Honey Bee)      Bee stings       Medications:    chlorhexidine gluconate (PERIDEX) 0.12% mouthwash 15 mL 2x/day    docusate sodium (COLACE) 10mg  per mL oral liquid 100 mg 2x/day    enoxaparin PF (LOVENOX) 40 mg/0.4 mL SubQ injection 40 mg Daily    folic acid (FOLATE) 5 mg/mL injection 1 mg Daily     haemophilus B conj-tetanus toxoid (ACTHIB) injection 0.5 mL Once    ipratropium-albuterol 0.5 mg-3 mg(2.5 mg base)/3 mL Solution for Nebulization 3 mL Q4H    lactulose 200 gm in SW 700 mL (TOT VOL 1000 mL) retention enema 200 g Q8H    lidocaine (LIDODERM) 5% patch 1 Patch Daily    meningococcal conjugate (PF) vaccine (MENVEO) IM injection 0.5 mL Once    meningococcal group B vaccine (BEXSERO) IM injection 0.5 mL Once    metoclopramide (REGLAN) 5 mg/mL injection 10 mg Q6HRS    midazolam (VERSED) 1 mg/mL injection 2 mg Once    multivitamin (THERA) tablet 1 Tablet Daily    NS flush syringe 10 mL Q8HRS    pantoprazole (PROTONIX) injection 40 mg Q12H **AND** NS flush syringe 10 mL  Daily    nystatin (NYSTOP) 100,000 units/g topical powder  2x/day    OLANZapine (ZYPREXA) tablet 10 mg Daily    pneumococcal 13-valent conjugate vaccine (PREVNAR 13) IM injection 0.5 mL Once    potassium chloride (K-DUR) extended release tablet 20 mEq Q8H    rifAXIMin (XIFAXAN) tablet 550 mg Q12H    sennosides (SENNA) tablet 8.6 mg 2x/day    sertraline (ZOLOFT) tablet 100 mg NIGHTLY    thiamine (VITAMIN B1) 100 mg/mL injection 100 mg Daily    traZODone (DESYREL) tablet 50 mg NIGHTLY      Vital Signs:  Filed Vitals:    05/19/21 0530 05/19/21 0600 05/19/21 0630 05/19/21 0700   BP: 113/61 133/65 117/62 (!) 109/59   Pulse: 64 66 66 65   Resp:       Temp:       SpO2: 98% 96% 97% 97%          Neurological:  Mental status:  Patient is intubated and on sedation.  He does open signs when caught his name.  Cranial nerves:  PERRLA, face symmetric, bilateral corneal reflexes present, cough/gag present.  Motor:  Does not withdraw to pain but is able to move all extremities spontaneously.  Sensory:  Patient is sedatedReflexes: 2+ throughout. Plantar reflexes downgoing bilaterally  Coordination: No d tremor seen  Gait:  Not tested    Assessment and Recommendations:    1.Encephalopathy  2. History of chronic alcoholism    This is a  60 year old male who presents to hospital after he fell down the stairs intoxicated.  The patient has a longstanding history of alcoholic cirrhosis and portal hypertension.  He was intubated and sedated and it has been difficult to wean him off sedation to to periods of intermittent agitation.   the patient has been continually on Precedex and it was not possible to obtain an accurate neurological exam .  I suspect the patient's encephalopathy to be due to a combination of toxic and metabolic etiology.  The patient has a history of alcoholic cirrhosis and his ammonia level have been elevated.   Moreover he has also been on multiple sedating medications for the past 2 weeks which could affecting his mental status.    EEG was performed today which did not show any evidence of epileptiform discharges.    The patient's MRI brain did not show any evidence of an acute intracranial process.  Patient's encephalopathy is likely a combination of patient's underlying metabolic abnormalities, agitation and ongoing use of sedation.  The patient's mental status will likely improve once his underlying metabolic abnormalities are corrected and when sedation is discontinued.  Thank you for the consultation  Beau Fanny      This note was partially generated using MModal Fluency Direct system, and there may be some incorrect words, spellings, and punctuation.

## 2021-05-20 ENCOUNTER — Inpatient Hospital Stay (HOSPITAL_COMMUNITY): Payer: MEDICAID

## 2021-05-20 DIAGNOSIS — Z9981 Dependence on supplemental oxygen: Secondary | ICD-10-CM

## 2021-05-20 DIAGNOSIS — W109XXD Fall (on) (from) unspecified stairs and steps, subsequent encounter: Secondary | ICD-10-CM

## 2021-05-20 DIAGNOSIS — S36032D Major laceration of spleen, subsequent encounter: Secondary | ICD-10-CM

## 2021-05-20 LAB — ADULT ROUTINE BLOOD CULTURE, SET OF 2 BOTTLES (BACTERIA AND YEAST): BLOOD CULTURE, ROUTINE: NO GROWTH

## 2021-05-20 LAB — BASIC METABOLIC PANEL
ANION GAP: 9 mmol/L (ref 5–19)
BUN/CREA RATIO: 19 (ref 6–20)
BUN: 11 mg/dL (ref 9–20)
CALCIUM: 8.3 mg/dL — ABNORMAL LOW (ref 8.4–10.2)
CHLORIDE: 113 mmol/L — ABNORMAL HIGH (ref 98–107)
CO2 TOTAL: 19 mmol/L — ABNORMAL LOW (ref 22–30)
CREATININE: 0.59 mg/dL — ABNORMAL LOW (ref 0.66–1.20)
ESTIMATED GFR: >60 mL/min/{1.73_m2} (ref >60)
GLUCOSE: 111 mg/dL — ABNORMAL HIGH (ref 74–106)
POTASSIUM: 3.8 mmol/L (ref 3.5–5.1)
SODIUM: 141 mmol/L (ref 137–145)

## 2021-05-20 LAB — CBC WITH DIFF
BASOPHIL #: 0.1 10*3/uL (ref 0.00–0.20)
BASOPHIL %: 1 % (ref 0–2)
EOSINOPHIL #: 0.2 10*3/uL (ref 0.00–0.60)
EOSINOPHIL %: 2 % (ref 0–5)
HCT: 31.2 % — ABNORMAL LOW (ref 36.0–46.0)
HGB: 10.4 g/dL — ABNORMAL LOW (ref 13.9–16.3)
LYMPHOCYTE #: 1.4 10*3/uL (ref 1.10–3.80)
LYMPHOCYTE %: 16 % — ABNORMAL LOW (ref 19–46)
MCH: 34 pg (ref 25.4–34.0)
MCHC: 33.3 g/dL (ref 30.0–37.0)
MCV: 102 fL — ABNORMAL HIGH (ref 80.0–100.0)
MONOCYTE #: 1 10*3/uL — ABNORMAL HIGH (ref 0.10–0.80)
MONOCYTE %: 11 % (ref 4–12)
MPV: 11.5 fL (ref 7.5–11.5)
NEUTROPHIL #: 6.4 10*3/uL (ref 1.80–7.50)
NEUTROPHIL %: 70 % — ABNORMAL HIGH (ref 41–69)
PLATELETS: 167 10*3/uL (ref 130–400)
RBC: 3.06 10*6/uL — ABNORMAL LOW (ref 4.30–5.90)
RDW: 15.2 % — ABNORMAL HIGH (ref 11.5–14.0)
WBC: 9.1 10*3/uL (ref 4.5–11.5)

## 2021-05-20 LAB — PHOSPHORUS: PHOSPHORUS: 3.4 mg/dL (ref 2.5–4.5)

## 2021-05-20 LAB — MAGNESIUM: MAGNESIUM: 2.3 mg/dL (ref 1.6–2.3)

## 2021-05-20 LAB — RESPIRATORY CULTURE AND GRAM STAIN (PERFORMABLE): RESPIRATORY CULTURE: NORMAL

## 2021-05-20 MED ORDER — LABETALOL 20 MG/4 ML (5 MG/ML) INTRAVENOUS SYRINGE
10.0000 mg | INJECTION | Freq: Once | INTRAVENOUS | Status: AC
Start: 2021-05-20 — End: 2021-05-20
  Administered 2021-05-20: 10 mg via INTRAVENOUS
  Filled 2021-05-20: qty 4

## 2021-05-20 NOTE — OT Treatment (Signed)
[facility]  Occupational Therapy Progress Note    Patient Name: Hector [name]  Date of Birth: Sep 19, 1961  Height:  182.9 cm (6')  Weight:  109 kg (240 lb 15.4 oz)  Room/Bed: 367/A  Payor: [employer]ICAID / Plan: HEALTH PLAN MEDICAID / Product Type: Medicaid [facility] /     Assessment:       05/20/21 0950   Daily Activity AM-PAC/6-clicks Score   Putting on/Taking off clothing on lower body 1   Bathing 1   Toileting 1   Putting on/Taking off clothing on upper body 1   Personal grooming 1   Eating Meals 1   Raw Score Total 6   Standardized (t-scale) Score 17.07   CMS 0-100% Score 100   CMS Modifier CN         Discharge Needs:   Equipment Recommendation: to be determined    Discharge Disposition: LTACH    Plan:   Continue to follow patient according to established plan of care.  The risks/benefits of therapy have been discussed with the patient/caregiver and he/she is in agreement with the established plan of care.     Subjective & Objective:     Optiflow, fecal manager, indwelling catheter.  He was extubated 05/19/21.  Strict bedrest orders in place.  He arouses to voice but does not maintain alertness or follow commands during OT this am.  OT provided orientation information and PROM x10 minutes BUE within available ranges.  Tolerated without signs of distress/discomfort.  Plan:  will follow and progress as indicated and tolerated.      Pain: 3/10 faces scale    Level of consciousness lethargic    Orientation person only    Bed mobility D    Bathing D  Dressing D  Toileting D  Grooming D  Eating D    Patient education orientation information.          Therapist:   Landry Corporal, OT     Start Time: [phone]  End Time: 1000  Total Treatment Time: 10 minutes  Charges Entered: TEx1  Department Number: [id number]

## 2021-05-20 NOTE — Speech Evaluation (Signed)
[facility]  Inpatient Speech Therapy  [address], [place] [address]  Phone: [phone]  Fax: [phone]- [phone]    Inpatient Swallowing Evaluation        Patient Name: Hector [name]    Date of Birth: Jul 16, 1961    Patient MRN: [id number]    Referring Physician for this consult: Bright NP      Did the RN or physician clear SLP to complete this evaluation?: Yes      History & Current Problem      Hector [name] is a 60 y.o. male who was admitted to the ER after falling down 11 stairs while intoxicated at 2:30 AM and woke up having SOB and chest pain. CT showing posterior left sided rib fractures. There was concern for SOB and airway protection, so pt. Was intubated from 5/19-6/5 for mechanical ventilation.    Patient Active Problem List   Diagnosis   . Multiple rib fractures   . Left pulmonary contusion   . Spleen injury   . Closed fracture of transverse process of thoracic vertebra (CMS HCC)   . 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 Thrombocytopenia (CMS HCC)   . Alcohol abuse   . On mechanically assisted ventilation (CMS HCC)   . Pleural effusion   . Encephalopathy   . Serum ammonia increased (CMS [facility])   . Ileus (CMS HCC)   . Leukopenia   . Status post thoracentesis       Past Medical History:   Diagnosis Date   . Alcohol abuse    . Cirrhosis (CMS [facility])    . Delirium, withdrawal, alcoholic (CMS HCC)    . HTN (hypertension)    . Unknown cause of injury     fx nose   . Wears glasses            Recent CXR:   Recent Results (from the past 720 hour(s))   XR AP MOBILE CHEST    Collection Time: 05/20/21  6:09 AM    Narrative    Coralyn Mark LEE Widdowson    RADIOLOGIST: Kelby Frame    XR AP MOBILE CHEST performed on 05/20/2021 6:09 AM    CLINICAL HISTORY: Chest pain/SOB, Left pleural effusion.  re eval effusions     TECHNIQUE: Frontal view of the chest.    COMPARISON:  Chest x-ray from one day ago    FINDINGS:  Endotracheal tube is no longer seen. NG tube tip is below the diaphragm in the  stomach. PICC line tip at the distal SVC is stable   Cardiac and mediastinal contours are stable.   Patchy infiltrative bilateral changes similar to the prior exam given difference in technique. Hazy appearance left lower chest likely indicating effusion.  Left rib fractures are seen. No obvious pneumothorax           Impression    NO SIGNIFICANT CHANGE SINCE THE PRIOR EXAM. (Extubated)      Radiologist location ID: [id number]         Date of admission/onset of illness: 05/01/2021    Living arrangement prior to admission: Home    Patient's diet prior to this admission (if known): Regular and Thin/Level 0  Current diet consistency at time of evaluation: NPO    Subjective:   Patient subjective: Patient awake in bed, verbalized single words in, short phrases, and followed some 1 step body commands in response to SLP but did not communicate spontaneously.   Dysphagia s/s  reported by patient, family, or staff/Reason for Swallowing Evaluation: routine for prolonged mechanical ventilation via ET tube   Patient's Goal: none stated   Orientation: oriented to person, oriented to place and impairment of time  Communication level: Difficulty expressing self , Difficulty understanding others  and Cognitive deficits noted  Mental Status: Responsive, Cooperative and Confused  Hearing: Appears WFL/adequate for communication   Vision: Appears WFL on observation    Objective:   Oxygenation: Nasal cannula- 5L   Vocal Quality: Rough/raspy and Breathy/audible air escape   Dentition: Natural Dentition  Mucosa Status: Dry and Lingual coating   Oral Care: Suction swabs/toothbrush  Oral Motor Examination: poor labial seal, generalized weakness , disorganized movements, reduced lingual ROM and Right buccal impairment   Does patient appear to be managing oral secretions? yes    Assessment:   Positioning during assessment: Fully upright in bed  Feeding ability during assessment: Unable to hold and/or coordinate use of utensils , Liquids  presented  and controlled by SLP and Solids presented and controlled by SLP    Liquid Trials  Liquid Consistency : Moistened Swabs of water   Route/Volume of Presentation :Swabs of water x3  Oral Phase: weak w/ poor closure around and manipulation of swab. Severely extended oral phase w/ delayed swallow initiation  Pharyngeal Phase: Throat Clearing and Coughing during both oral care and soaked swabs of water. May have trace/drained swabs for comfort w/ staff only.    Liquid Consistency : Nectar thick/Mildly Thick/Level 2  Route/Volume of Presentation :1/2 TSP x5  Oral Phase: Poor labial seal, A/P Transit Delayed and Bolus organization fair/adequate  Pharyngeal Phase: Audible swallow, Multiple Swallows , Throat Clearing, Coughing, Wet vocal quality and Expectoration of the bolus . This consistency not recommended at this time d/t overt dysphagia w/ suspected risk for aspiration    Solid Trials   Solid Consistency: puree  Route/Volume of Presentation: 1/2 TSP x4  Oral Phase: Poor bolus stripping, A/P Inadequate , Bolus organization poor/inadequate and right sulcus residue that required suction to clear. Patient unaware of significant pocketing. Appears that minimal of the bolus was swallowed.   Pharyngeal Phase: No overt s/s of aspiration observed at bedside during subjective assessment, but minimal bolus was swallows for adequate assessment of the pharyngeal phase as the majority was pocketed int the right sulcus and cleared w/ suction. Not functional for PO or meds at this time.     Conclusions:   Treatment Diagnosis:  Per bedside assessment, suspected Moderate-Severe Oropharyngeal Dysphagia   Problems list supporting diagnosis and recommendations: Pool oral prep/transit, Oral residue, Slow oral transit , Swallow Delay, Throat clearing, Coughing, Multiple swallows may indicate pharyngeal impairment , Wet vocal quality, Fatigue/poor endurance with eating/drinking, Need for MBS prior to initiation of PO diet and  Confusion affecting swallowing safety/swallowing abilities  Rehab Potential: Fair      Goals:     Short Term Dysphagia Goals:   1. Pt will present w/  less than 10% overt s/sx of aspiration on liquid trials of Nectar Thick/Mildly Thick/ Level 2 with ST only  2. Pt. will present w/ less than 10% overt s/sx of aspiration on solid trials of Puree  w/ ST only  3. Pt. will complete MBS for objective assessment of swallow function   4. Pt. will resume PO diet for nutrition/hydration and maintain w/o clinical complications or resumption of NPO status     Long Term Dysphagia Goals: Clients will maintain adequate hydration/nutrition with optimum safety and efficiency of swallowing  function P.O. intake without overt s/s of aspiration for the highest appropriate diet level         Plan:   Diet Recommendations: NPO   Aspiration Precautions/Compensatory Strategies: trace swabs of water w/ staff post oral care for comfort  Medication administration: No oral meds d/t high risk for aspiration   Feeding Assistance: N/A d/t NPO status  General plan of care: Therapeutic trials for possible advancement , Therapeutic exercises to strengthen the swallowing mechanism , Further dysphagia diagnostics, Compensatory strategies, Aspiration precautions and Patient/family education/training  Recommended Diagnostics: VFSS/Modified Barium Swallow Study when appropriate per SLP and physician discretion   Results & Recommendations/ Education provided to: Nurse regarding Aspiration risks and precautions and recommendation for pt. to remain NPO w/ ongoing assessment  Is dysphagia intervention warranted?: yes  Frequency of treatment: 3-5 days/week until goals met, discharged from this facility, or a plateau in swallow function w/ no further expectation in functional progress    Anticipated discharge destination: [facility]  Patient would benefit from the following referrals: Dietician , OT and  PT      Therapist:   Terance Ice, SLP      Evaluation Time: 18 minutes

## 2021-05-20 NOTE — Progress Notes (Signed)
[facility]  Department of Family Medicine  ICU Progress Note    Name: Hector [name]  Age: 60 y.o.   Gender: male  MRN: [id number]  Date of Admission:  05/01/2021  PCP: No Pcp  Attending: Clinton Quant, MD  Consultants:   Consult Orders Placed This Encounter   Procedures   . IP CONSULT TO SOCIAL WORK   . IP CONSULT TO CARE MANAGEMENT   . IP CONSULT TO SOCIAL WORK   . IP CONSULT TO INTENSIVIST Requested Provider; Maurine Minister, Delfino Lovett E; 05/02/2021; 7:53 AM   . IP CONSULT TO SOCIAL WORK   . IP CONSULT TO NUTRITION SERVICES Reason for Consult: DETERMINE APPROPRIATE TUBE FEED   . IP CONSULT TO NUTRITION SERVICES Reason for Consult: DETERMINE APPROPRIATE TUBE FEED   . IP CONSULT TO NUTRITION SERVICES Reason for Consult: DETERMINE APPROPRIATE TUBE FEED   . Social Work Consult - LTAC eval   . IP CONSULT TO [place] Requested Provider; Beau Fanny   . IP CONSULT TO SOCIAL WORK     Subjective:   No acute events overnight as per nursing staff. Evaluated patient at bedside, doing well post-extubation. Placed on Airvo, O2 sat in the high 90's. Orogastric tube in place. On Precedex and ketamine drip. Foley catheter in place draining amber colored urine. Afebrile, vital signs have been stable overnight.    Review of Systems:  As above    Objective:   BP (!) 147/72   Pulse 77   Temp 37.2 C (99 F)   Resp 20   Ht 1.829 m (6')   Wt 109 kg (240 lb 15.4 oz)   SpO2 98%   BMI 32.68 kg/m     PHYSICAL EXAM  General:on Airvo, awake, and arousable, unable to follow commands on exam  Eyes:Conjunctiva clear.  HENT:orogastric tube in place  Neck:no thyromegaly or lymphadenopathy  Lungs:bilateral rhonchi, decreased breath sounds  Cardiovascular:regular rate and rhythm, S1, S2 normal, no murmur, click, rub or gallop  Abdomen:soft compared to previous exam, round protuberant abdomen, hypoactive bowel sounds  Extremities:No cyanosis or edema  Skin:Skin warm and dry  Psychiatric:lethargic but  arousable    Labs:  CBC:     9.1 (06/06 0509) \   10.4* (06/06 0509) /   167 (06/06 0509)      / 31.2* (06/06 0509) \      BMP:   141 (06/06 0539) 113* (06/06 7673) 11 (06/06 4193)    /     111* (06/06 0509)   3.8 (06/06 0509) 19* (06/06 0509) 0.59* (06/06 0509) \         In's and Out's:    Intake/Output Summary (Last 24 hours) at 05/20/2021 0848  Last data filed at 05/20/2021 0500  Gross per 24 hour   Intake 1415 ml   Output 3950 ml   Net -2535 ml     Radiology Results:  XR AP MOBILE CHEST    Result Date: 05/19/2021  Impression NO SIGNIFICANT CHANGE SINCE THE PRIOR EXAM. Radiologist location ID: [id number]     XR KUB    Result Date: 05/19/2021  Impression NO SIGNIFICANT CHANGE FROM THE PRIOR STUDY. Radiologist location ID: [id number]     XR KUB    Result Date: 05/18/2021  Impression Diffuse gaseous distention of the bowel suggestive of an ileus. Radiologist location ID: [id number]     Microbiology:  No results found for any visits on 05/01/21 (from the past 24 hour(s)).    Assessment and  Plan:   1. Acute hypoxic respiratory failure due to multiple rib fractures and grade 3 splenic injury secondary to accidental trauma  - Patient extubated. Patient on Airvo.  - Orogastric tube in place.  - Chest CT - multiple medial and left rib fractures; transverse process fractures at 5 and T6; small left pneumothorax; left basal infiltrate and small left effusion  - CT A/P - grade 3 splenic laceration  - Hgb 10.4; platelet count - 121  - ABG shows metabolic alkalosis    2. Alcohol withdrawal syndrome  - Patient doing well post-extubation.  - Patient's abdomen is distended. Imaging consistent with ileus. Patient on OG tube.  - EKG shows sinus rhythm  - Ammonia level improving down to 61  - EEG pending  - CIWA assessment protocol  - Regular confusion assessment method  - Versed given x1 dose; Zyprexa 5 mg IM q12h prn for agitation  - On Precedex and ketamine  - Continue lactulose and bowel regimen  - Thiamineand folate  - Reglan prn for  nausea/vomiting  - Neurology consulted    Chronic medical conditions  - Continue home medications unless otherwise stated    Diet:DIET NPO - NOW  TUBE FEED - CONTINUOUS DRIP CONTINUOUS NO MEALS, TF ONLY; Vital High Protein; OG; Goal Rate (ml/hr): 60; Initial Rate (ml/hr): 60; Flush Tube with water (select amt): 100 ml; Flush Frequency: Q4H  DVT/PE:Lovenox  Code Status:Full    Tedra Coupe, MD   05/20/2021, 08:27   Family Medicine Resident, PGY-1  [facility]

## 2021-05-20 NOTE — Progress Notes (Addendum)
Name: Hector [name] MRN:  [id number]   Date of admission: 05/01/2021 Age: 60 y.o.       Name - Hector [name]    Date of service - 05/20/2021      Interval history  The patient was extubated yesterday.  He has been protecting his airway and following commands.  He does however require some supplemental oxygen.  He is currently NPO with a OG tube in place.  He has no complaints this morning except for some back pain.    Physical exam  Filed Vitals:    05/20/21 0700 05/20/21 0800 05/20/21 0900 05/20/21 1000   BP: (!) 163/79 (!) 157/71 (!) 156/83 (!) 163/71   Pulse: 76 77 83 75   Resp: 19 16 (!) 22 17   Temp: 37.2 C (99 F)      SpO2: 98% 97% 97% 98%          Intake/Output Summary (Last 24 hours) at 05/20/2021 1100  Last data filed at 05/20/2021 0500  Gross per 24 hour   Intake 1360 ml   Output 3750 ml   Net -2390 ml        General:Patient follows commands GCS 15  Abdomen: soft, nontender, small umbilical hernia,very minimal distension  Skin: No jaundice, lesions, or rashes.  Lungs:Equal but rhonchorous bilaterally  CV: Hemodynamically stable  Extremities:no significant edema      Labs/Imaging -     XR AP MOBILE CHEST performed on 05/20/2021 6:09 AM    CLINICAL HISTORY: Chest pain/SOB, Left pleural effusion.  re eval effusions     TECHNIQUE: Frontal view of the chest.    COMPARISON:  Chest x-ray from one day ago    FINDINGS:  Endotracheal tube is no longer seen. NG tube tip is below the diaphragm in the stomach. PICC line tip at the distal SVC is stable   Cardiac and mediastinal contours are stable.   Patchy infiltrative bilateral changes similar to the prior exam given difference in technique. Hazy appearance left lower chest likely indicating effusion.  Left rib fractures are seen. No obvious pneumothorax         IMPRESSION:  NO SIGNIFICANT CHANGE SINCE THE PRIOR EXAM. (Extubated)    ASSESSMENT & RECOMMENDATIONS:   60y.o.malestatus post fall downstairs sustaining multiple left-sided rib fractures  with subcutaneous emphysema,transverse process fracture of thoracic spine,and grade 3 splenic laceration  -patient to be evaluated by speech before we advance diet or place Dobbhoff tube...  Please remove OG tube  -Lovenox for DVT prophylaxis   -consult to ancillary services of PT OT  -appreciate the assistance of the ICU team  - patient to remain in the ICU for today just for monitoring his respiratory status  - encourage incentive spirometer use and aggressive pulmonary toilet    Clinton Quant, MD      Note: A portion of this documentation was generated using MMODAL (voice recognition software) and may contain syntax/voice recognition errors.

## 2021-05-20 NOTE — Care Management Notes (Signed)
05/20/21 1405   Assessment Details   Assessment Type Continued Assessment   Date of Care Management Update 05/20/21   Care Management Plan   Discharge Planning Status plan in progress   [place] Management Note    Patient Name: Hector [name]  Date of Birth: 1961/08/19  Sex: male  Date/Time of Admission: 05/01/2021  9:05 AM  Room/Bed: 367/A  Payor: [place] / Plan: [place] / Product Type: Medicaid [place] /    LOS: 19 days   Primary Care Providers:  Pcp, No (General)    Admitting Diagnosis:  Multiple rib fractures [S22.49XA]    Assessment:   Patient remains in the ICU. He was extubated on 6/5. He is on Optiflo. SS is working on health care surrogate papers.     Discharge Plan:  Undetermined at this time      The patient will continue to be evaluated for developing discharge needs.     Case Manager: Kem Kays, RN  Phone: [phone]

## 2021-05-20 NOTE — Care Plan (Signed)
Problem: Adult Inpatient Plan of Care  Goal: Absence of Hospital-Acquired Illness or Injury  Intervention: Identify and Manage Fall Risk  Recent Flowsheet Documentation  Taken 05/20/2021 0800 by Ola Spurr, RN  Safety Promotion/Fall Prevention: fall prevention program maintained  Intervention: Prevent Skin Injury  Recent Flowsheet Documentation  Taken 05/20/2021 0800 by Ola Spurr, RN  Body Position: turned q 2 hours  Intervention: Prevent and Manage VTE (Venous Thromboembolism) Risk  Recent Flowsheet Documentation  Taken 05/20/2021 1000 by Ola Spurr, RN  VTE Prevention/Management:   sequential compression devices on   anticoagulant therapy maintained  Intervention: Prevent Infection  Recent Flowsheet Documentation  Taken 05/20/2021 0800 by Ola Spurr, RN  Infection Prevention: rest/sleep promoted  Goal: Optimal Comfort and Wellbeing  Intervention: Provide Person-Centered Care  Recent Flowsheet Documentation  Taken 05/20/2021 0800 by Ola Spurr, [place] Relationship/Rapport:   care explained   reassurance provided

## 2021-05-20 NOTE — PT Treatment (Signed)
[facility]  [place], [place] [address]  [phone]         Physical Therapy Acute Care Daily Treatment Record       Date: 05/20/2021  Time: 1105  Patient's Name: Hector [name]  Date of Birth: 24-Mar-1961  Room Number: 367/A    Pertinent Information since last Physical Therapy visit :    Patient was extubated on 6/5      Precautions / Limitations :    No known precautions / limitations     Weight Bearing status, if applicable :    No Weightbearing restrictions     Subjective :   Did not vocalize     Pain Rating :   Pre-Treatment Pain: no facial grimacing noted                                  Orientation & Level of Orientation :    Alert, Awake and Confused      6 Clicks Score :    1.) Turning from back to side while flat in bed without using a bed rail.    Total assistance (1)  2.) Moving from lying on back to sitting on the side of a flat bed without using bed rails.   Total assistance (1)  3.) Moving to and from a bed to a chair, including a wheelchair.    Total assistance (1)  4.) Standing up for chair using your arms (eg, wheelchair or bedside chair).   Total assistance (1)  5.) Walking in hospital room.   Total assistance (1)  6.) Climbing 3-5 steps with a railing.    Total assistance (1)    6 Clicks Plan: Score <95 - PT consult is appropriate; placement may be necessary at discharge      Treatment :      Transfers:  Rolling in bed:    Maximum Assist  with assist of 2 or More    Rolling in bed with max assist of 2-3.       Positioned in bed with assist of 2 to 3.                                      Sitting Balance:                            Standing Balance:                    Stairs:                            Therapeutic Exercise:     Essentially PROM to le's while supine in bed.              Therapeutic Activity:             Canalith Repositioning:             Other Treatment Interventions:   Patient was quite inconsistent with  following commands. Patient was able to move bilateral le's.     Daily Assessment of Status :    Extubated. Able to inconsistently follow commands. Moving bilateral le's.  Timed Charge Number of Minutes   Neuromuscular Re-Education     Gait Training      Therapeutic Activity      Therapeutic Exercise   24   Canalith Re-positioning      Other     Total Treatment Time  24     Patient Education :            Interdisciplinary Communication :                Discharge Planning :   Long Term Acute Care (LTAC)    Plan :   Continue Physical Therapy program     Jackie Plum, PT, DPT  05/20/2021, 14:59

## 2021-05-20 NOTE — Care Plan (Signed)
Pt successfully extubated yesterday afternoon 05/19/21 currently tolerating OptiFlo 40L 60%, pt has a weak productive cough with need for oral suctioning multiple times this shift. Will continue to assess pt and update POC accordingly   Problem: Fall Injury Risk  Goal: Absence of Fall and Fall-Related Injury  Outcome: Ongoing (see interventions/notes)  Intervention: Identify and Manage Contributors  Recent Flowsheet Documentation  Taken 05/20/2021 0005 by Joana Reamer, RN  Medication Review/Management: medications reviewed  Taken 05/19/2021 2000 by Joana Reamer, RN  Self-Care Promotion: BADL personal objects within reach  Medication Review/Management: medications reviewed  Intervention: Promote [place] Documentation  Taken 05/20/2021 0005 by Joana Reamer, RN  Safety Promotion/Fall Prevention: fall prevention program maintained  Taken 05/19/2021 2000 by Joana Reamer, RN  Safety Promotion/Fall Prevention:   fall prevention program maintained   safety round/check completed     Problem: Alcohol Withdrawal  Goal: Alcohol Withdrawal Symptom Control  Outcome: Ongoing (see interventions/notes)  Intervention: Minimize or Manage Alcohol Withdrawal Symptoms  Recent Flowsheet Documentation  Taken 05/20/2021 0005 by Joana Reamer, RN  Sensory Stimulation Regulation:   care clustered   lighting decreased   quiet environment promoted  Taken 05/19/2021 2000 by Joana Reamer, RN  Sensory Stimulation Regulation:   quiet environment promoted   care clustered     Problem: Acute Neurologic Deterioration (Alcohol Withdrawal)  Goal: Optimal Neurologic Function  Outcome: Ongoing (see interventions/notes)  Intervention: Minimize or Manage Acute Neurologic Symptoms  Recent Flowsheet Documentation  Taken 05/20/2021 0005 by Joana Reamer, RN  Sensory Stimulation Regulation:   care clustered   lighting decreased   quiet environment promoted  Cerebral Perfusion Promotion: blood pressure monitored  Taken 05/19/2021 2000 by Joana Reamer, RN  Sensory  Stimulation Regulation:   quiet environment promoted   care clustered  Airway/Ventilation Management:   calming measures promoted   pulmonary hygiene promoted  Cerebral Perfusion Promotion: blood pressure monitored     Problem: Substance Misuse (Alcohol Withdrawal)  Goal: Readiness for Change Identified  Outcome: Ongoing (see interventions/notes)  Intervention: Promote Psychosocial Wellbeing  Recent Flowsheet Documentation  Taken 05/19/2021 2000 by Joana Reamer, [place]: support provided  Intervention: Partner to [place] Documentation  Taken 05/20/2021 0005 by Joana Reamer, RN  Supportive Measures: positive reinforcement provided  Taken 05/19/2021 2000 by Joana Reamer, [place]: support provided  Supportive Measures: positive reinforcement provided     Problem: Gas Exchange Impaired  Goal: Optimal Gas Exchange  Outcome: Ongoing (see interventions/notes)  Intervention: Optimize Oxygenation and Ventilation  Recent Flowsheet Documentation  Taken 05/20/2021 0005 by Joana Reamer, RN  Head of Bed [facility]) Positioning: HOB at 30-45 degrees  Taken 05/19/2021 2200 by Joana Reamer, RN  Head of Bed [facility]) Positioning: HOB at 30-45 degrees  Taken 05/19/2021 2000 by Joana Reamer, RN  Airway/Ventilation Management:   calming measures promoted   pulmonary hygiene promoted  Head of Bed (HOB) Positioning: HOB at 30-45 degrees     Problem: Health Knowledge, Opportunity to Enhance (Adult,Obstetrics,Pediatric)  Goal: Knowledgeable about Health Subject/Topic  Description: Patient will demonstrate the desired outcomes by discharge/transition of care.  Outcome: Ongoing (see interventions/notes)  Intervention: Enhance Health Knowledge  Recent Flowsheet Documentation  Taken 05/20/2021 0005 by Joana Reamer, RN  Supportive Measures: positive reinforcement provided  Taken 05/19/2021 2000 by Joana Reamer, RN  Family/Support System Care: support provided  Supportive Measures: positive reinforcement  provided

## 2021-05-20 NOTE — Progress Notes (Signed)
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 AM    FINDINGS:  The support lines and tubes are in stable and satisfactory position. New PICC line tip is in the SVC. No pneumothorax.   Heart size is moderately enlarged.     There is diffuse left-sided infiltrate. There is right lower lobe infiltrate/effusion. These findings are stable. There could be a CHF component.      Impression    No interval change from 05/02/2021 in the lung fields.      Radiologist location ID: [id number]     XR AP MOBILE CHEST     Status: None    Narrative    Sirr LEE Villescas    RADIOLOGIST: Dorisann Frames, MD    XR AP MOBILE CHEST performed on 05/03/2021 7:01 AM    CLINICAL HISTORY: Trauma.  resp failure   vent    TECHNIQUE: Frontal view of the chest.    COMPARISON:  Yesterday    FINDINGS:  The support lines and tubes are in stable and satisfactory position.   Cardiac and mediastinal contours are stable.   No significant change in the appearance of the lungs.           Impression    NO SIGNIFICANT CHANGE SINCE THE PRIOR EXAM.      Radiologist location ID: [id number]     CT CHEST ABDOMEN PELVIS W IV CONTRAST     Status: None    Narrative    Nickoli LEE Maroney    RADIOLOGIST: Peterson Ao    CT CHEST ABDOMEN PELVIS W IV CONTRAST performed on 05/03/2021 10:02 AM    CLINICAL HISTORY: Reassess grade 3 splenic laceration and  rib fractures/pneumothorax.  increasing respiratory distress    TECHNIQUE:  Chest, abdomen and pelvis CT with  intravenous contrast.  CONTRAST:  75 ml's of Isovue 300    COMPARISON:  None.  # of known CTs in the past 12 months: 0   # of known Cardiac Nuclear Medicine Studies in the past 12 months: 0    FINDINGS:    Image quality is degraded secondary to respiratory motion artifact.    CT CHEST:  Hardware:  Endotracheal tube in satisfactory position. Transesophageal catheter terminates in the stomach.    Lymph nodes:   No mediastinal, hilar, or axillary lymphadenopathy.    Heart and Vasculature:  Cardiomegaly.  No pericardial effusion. Thoracic aorta and pulmonary arteries are unremarkable.      Lungs and Airways:  Worsening bilateral lower lobe consolidation. Mild underlying emphysematous changes.    Pleura: Trace left pleural effusion. Interval resolution of the previously seen small left-sided pneumothorax.    Bones: Redemonstration of multiple displaced left-sided rib fractures. Interval decrease in the subcutaneous air in the left chest wall.      CT ABDOMEN/PELVIS:  Liver:   Nodular surface contours with hypertrophy of the caudate lobe compatible with cirrhosis    Gallbladder:   Cholelithiasis    Spleen:   The known splenic laceration is not as well visualized on the current exam. No abnormal perisplenic fluid collection is seen.    Pancreas:   Unremarkable.    Adrenals:   Unremarkable.    Kidneys:   Unremarkable.    Bladder:  Decompressed with an indwelling Foley catheter    Prostate:  Unremarkable.    Bowel:   No bowel obstruction.    Appendix:  Not visualized    Lymph nodes:  No suspicious lymph node enlargement.    Vasculature:   Mild diffuse atherosclerotic calcifications are noted.     Peritoneum / Retroperitoneum: No ascites.  No free air.    Bones:   Degenerative changes of the spine.          Impression    RESPIRATORY MOTION DEGRADED EXAM.    INTERVAL RESOLUTION OF THE PREVIOUSLY SEEN SMALL LEFT-SIDED  PNEUMOTHORAX.    WORSENING BILATERAL LOWER LOBE CONSOLIDATION THAT COULD REFLECT ATELECTASIS AND/OR PNEUMONIA    REDEMONSTRATION OF MULTIPLE DISPLACED LEFT-SIDED RIB FRACTURES WITH DECREASING SUBCUTANEOUS AIR IN THE CHEST WALL.    THE KNOWN GRADE 3 SPLENIC LACERATION IS NOT AS WELL VISUALIZED ON THE CURRENT EXAM AND IS STABLE TO IMPROVED FROM THE 05/01/2021 EXAM. NO NEW ACUTE FINDINGS IN THE ABDOMEN/PELVIS.      One or more dose reduction techniques were used (e.g., Automated exposure control, adjustment of the mA and/or kV according to patient size, use of iterative reconstruction technique).      Radiologist location ID: [id number]     XR AP MOBILE CHEST     Status: None    Narrative    Lora LEE Rosero    RADIOLOGIST: Betsey Amen, MD    XR AP MOBILE CHEST performed on 05/04/2021 7:03 AM    CLINICAL HISTORY: Trauma.  resp fail-vent    TECHNIQUE: Frontal view of the chest.    COMPARISON:  05/03/2021    FINDINGS:  Endotracheal tube in place whose tip is 3.9 cm from the carina. Nasogastric tube in place terminating over the stomach. Left arm PICC in place whose tip is in the lower SVC.   Heart size is moderately enlarged.     Small bilateral pleural effusions similar prior exam. Bibasilar atelectasis/pneumonia. No pneumothorax nor vascular congestion. No significant change.  Impression    No significant change.      Radiologist location ID: [id number]     XR AP MOBILE CHEST     Status: None    Narrative    Reice LEE Schuchart    RADIOLOGIST: Diana Eves, MD    XR AP MOBILE CHEST performed on 05/05/2021 6:31 AM    CLINICAL HISTORY: Trauma.  trauma/vent    TECHNIQUE: Frontal view of the chest.    COMPARISON:  Yesterday    FINDINGS:  Support tubes and lines are unchanged.   There is stable mild enlargement of the cardiac silhouette. The pulmonary vasculature is within normal limits.   There are persistent small bilateral pleural effusions with associated bibasilar atelectasis/infiltrate. These findings  have improved compared to prior study.           Impression    As above      Radiologist location ID: [id number]     XR AP MOBILE CHEST     Status: None    Narrative    Zerick LEE Dieter    RADIOLOGIST: Ileene Hutchinson    XR AP MOBILE CHEST performed on 05/05/2021 1:14 PM    CLINICAL HISTORY: Post Bronchoscopy.  vent     TECHNIQUE: Frontal view of the chest.    COMPARISON:  Chest radiograph dated 05/05/2018    FINDINGS:  The endotracheal tube terminates 5 cm above the carina. An enteric tube courses into the stomach. A left upper extremity PICC terminates near the superior cavoatrial junction.  The heart size is normal.   Lung volumes are low with hazy bibasilar airspace opacities. There is no pneumothorax.   The bones are unremarkable.        Impression    1.Bibasilar atelectasis or pneumonia/aspiration  2.No pneumothorax  3.Endotracheal tube terminating 5 cm above the carina      Radiologist location ID: [id number]     XR AP MOBILE CHEST     Status: None    Narrative    Matvey LEE Hendryx    RADIOLOGIST: Berton Bon, MD    XR AP MOBILE CHEST performed on 05/06/2021 6:32 AM    CLINICAL HISTORY: Trauma.  trauma/vent/resp failure    TECHNIQUE: Frontal view of the chest.    COMPARISON:  Yesterday    FINDINGS:  ET tube 3 cm above the carina. There is an NG tube in the stomach. There is a left PICC catheter in the SVC.   Heart size is moderately enlarged.     There are bilateral perihilar infiltrates   Mild right dorsal scoliosis        Impression    Persistent perihilar infiltrates/vascular congestion slightly worse on the left since yesterday      Radiologist location ID: [id number]     XR AP MOBILE CHEST     Status: None    Narrative    Kohler LEE Decelles    RADIOLOGIST: Dorisann Frames, MD    XR AP MOBILE CHEST performed on 05/07/2021 6:11 AM    CLINICAL HISTORY: Trauma.  on vent     TECHNIQUE: Frontal view of the chest.    COMPARISON:  Yesterday    FINDINGS:  The support lines and tubes are in stable and satisfactory  position.   Cardiac and mediastinal contours are stable.   No significant change in the appearance of the lungs.   Extensive bilateral pulmonary infiltrates persist with a moderate size left pleural effusion.        Impression  NO SIGNIFICANT CHANGE SINCE THE PRIOR EXAM.      Radiologist location ID: [id number]     [place] CHEST (EFFUSION)     Status: None    Narrative    Jaikob LEE Hassey    RADIOLOGIST: Colletta Maryland, MD    [place] CHEST (EFFUSION) performed on 05/07/2021 11:17 AM    CLINICAL HISTORY: pleural effusions..  check left pleural effusion    TECHNIQUE:  Ultrasound imaging of left chest.    COMPARISON:  None.    FINDINGS:  There is a moderate-sized left pleural effusion.        Impression    LEFT PLEURAL EFFUSION        Radiologist location ID: [id number]     US THORACENTESIS     Status: None    Narrative    *Procedure not read by radiology.    *Please Refer to Procedure Note for result.   XR AP MOBILE CHEST     Status: None    Narrative    Tyee LEE Ridlon    RADIOLOGIST: Berton Bon, MD    XR AP MOBILE CHEST performed on 05/07/2021 4:40 PM    CLINICAL HISTORY: POST THORACENTESIS.  s/p left thoracentisis.     TECHNIQUE: Frontal view of the chest.    COMPARISON:  Previous exam from today    FINDINGS:  ET tube 5 cm above the carina. There is an NG tube in the stomach. There is a left PICC catheter in the SVC   Heart size is mildly enlarged.     There are perihilar and basilar infiltrates. Possible small effusions obscuring the diaphragms   There are multiple left lateral rib fracture deformities        Impression    Negative for pneumothorax after thoracentesis      Radiologist location ID: [id number]     XR AP MOBILE CHEST     Status: None    Narrative    Aniruddh LEE Streeper    RADIOLOGIST: Otho Najjar, MD    XR AP MOBILE CHEST performed on 05/09/2021 6:15 AM    CLINICAL HISTORY: Mult. L rib fx.s, effusion, intubated.  resp fail-vent, effusion, multiple rib fx's    TECHNIQUE: Frontal view of the  chest.    COMPARISON:  05/07/2021    FINDINGS:  The support lines and tubes are in stable and satisfactory position. No pneumothorax.     There is increasing opacity bilaterally in the mid lung fields with infiltrate, effusion and vascular congestion. This could be inflammatory but the symmetry suggests development of pulmonary edema. Clinical correlation recommended.      Impression    Increasing opacity bilaterally suggesting increasing CHF. Clinical correlation recommended.      Radiologist location ID: [id number]     XR AP MOBILE CHEST     Status: None    Narrative    Cyris LEE Aubuchon    RADIOLOGIST: Colletta Maryland, MD    XR AP MOBILE CHEST performed on 05/10/2021 6:19 AM    CLINICAL HISTORY: respiratory failure.  resp fail-vent    TECHNIQUE: Frontal view of the chest.    COMPARISON:  05/09/2021    FINDINGS:  The support lines and tubes are in stable and satisfactory position.   Cardiac and mediastinal contours are stable.   Airspace disease is present in the mid to lower lungs with probable bilateral pleural effusions. Lung volumes are slightly improved since yesterday  Impression    BILATERAL AIRSPACE DISEASE AND PLEURAL EFFUSIONS WITH SOME IMPROVEMENT SINCE YESTERDAY      Radiologist location ID: [id number]     CT ANGIO CHEST FOR PULMONARY EMBOLUS W IV CONTRAST     Status: None    Narrative    Mindy LEE Neglia    RADIOLOGIST: Colletta Maryland, MD    CT ANGIO CHEST FOR PULMONARY EMBOLUS W IV CONTRAST performed on 05/10/2021 11:49 AM    CLINICAL HISTORY: hypoxia.  on the Vent , increased hypoxia. CXR showed increased opacities  Increase in CHF  Lung sounds coarse  . fall on 05-01-21    TECHNIQUE: CTA imaging of the chest with intravenous contrast.  3D reconstructions.  CONTRAST:  100 ml's of Isovue 370    COMPARISON: 05/03/2021  # of known CTs in the past 12 months: 7   # of known Cardiac Nuclear Medicine Studies in the past 12 months: 0         FINDINGS:  Hardware:  There is an NG tube in the stomach. An ET tube  is present in the trachea. There is a left-sided PICC line in the SVC.    Lymph nodes:   No mediastinal, hilar, or axillary lymphadenopathy.    Heart:  Coronary artery calcifications are noted.        RV/LV Diameter Ratio: N/A    Thoracic Aorta:  No thoracic aortic aneurysm or dissection.    Pulmonary Vessels:  No evidence of acute pulmonary emboli through the major subsegmental branches.    Lungs and Airways:  There is patient respiratory motion artifact which limits evaluation of the lungs.      Bilateral lower lobe airspace disease with air bronchograms. Pneumonia versus atelectasis.      Pleura: Bilateral pleural effusions left larger than right    Upper Abdomen: Cirrhosis with splenomegaly. Small volume of ascites. Probable cholelithiasis.    Bones: Bone windows are unremarkable.        Impression    1.NO PULMONARY EMBOLUS  2.BILATERAL PLEURAL EFFUSIONS  3.BILATERAL LOWER LOBE AIRSPACE DISEASE. PNEUMONIA VERSUS ATELECTASIS      One or more dose reduction techniques were used (e.g., Automated exposure control, adjustment of the mA and/or kV according to patient size, use of iterative reconstruction technique).      Radiologist location ID: [id number]     XR AP MOBILE CHEST     Status: None    Narrative    Shakil LEE Borgen    RADIOLOGIST: Colletta Maryland, MD    XR AP MOBILE CHEST performed on 05/11/2021 5:35 AM    CLINICAL HISTORY: respiratory failure.  resp fail-vent    TECHNIQUE: Frontal view of the chest.    COMPARISON:  Yesterday    FINDINGS:  The support lines and tubes are in stable and satisfactory position.   Cardiac and mediastinal contours are stable.   Bilateral airspace disease with bilateral pleural effusions similar to yesterday           Impression    NO ACUTE FINDINGS.      Radiologist location ID: [id number]     CT BRAIN WO IV CONTRAST     Status: None    Narrative    Shem LEE Havey    RADIOLOGIST: Colletta Maryland, MD    CT BRAIN WO IV CONTRAST performed on 05/12/2021 2:27 AM    CLINICAL HISTORY:  altered mental status.  Pt intubated and per RN very restless this early AM  TECHNIQUE:  Head CT without intravenous contrast.    COMPARISON: 05/01/2021  # of known CTs in the past 12 months: 8   # of known Cardiac Nuclear Medicine Studies in the past 12 months: 0    FINDINGS: This study is degraded by patient motion artifact  There is no acute intracranial hemorrhage, mass effect, or evidence of large acute infarct.    Brain: Low density in the periventricular white matter suggests mild chronic small vessel ischemic changes.    CSF Spaces: Mild generalized cerebral atrophy     Sinuses/Mastoids:  Clear at visualized levels     Bones: Unremarkable        Impression    CHRONIC CHANGES.  NO ACUTE FINDINGS.       One or more dose reduction techniques were used (e.g., Automated exposure control, adjustment of the mA and/or kV according to patient size, use of iterative reconstruction technique).      Radiologist location ID: [id number]     XR AP MOBILE CHEST     Status: None    Narrative    Chipper LEE Avants    RADIOLOGIST: Colletta Maryland, MD    XR AP MOBILE CHEST performed on 05/12/2021 6:20 AM    CLINICAL HISTORY: respiratory failure.  vent management     TECHNIQUE: Frontal view of the chest.    COMPARISON:  Yesterday    FINDINGS:  The support lines and tubes are in stable and satisfactory position.   Cardiac and mediastinal contours are stable.   There is a left pleural effusion and bilateral airspace disease with possible right pleural effusion. Mild improvement in the right lung since yesterday.           Impression    BILATERAL AIRSPACE DISEASE AND PLEURAL EFFUSIONS LEFT WORSE      Radiologist location ID: [id number]     XR AP MOBILE CHEST     Status: None    Narrative    Jaece LEE Berges    RADIOLOGIST: Colletta Maryland, MD    XR AP MOBILE CHEST performed on 05/13/2021 8:23 AM    CLINICAL HISTORY: respiratory failure, PNA.  resp.failure, PNA, vent    TECHNIQUE: Frontal view of the chest.    COMPARISON:   Yesterday    FINDINGS:  The support lines and tubes are in stable and satisfactory position.   Cardiac and mediastinal contours are stable.   There is a left pleural effusion unchanged from yesterday. The right lung is improved since yesterday with improved lung volumes.           Impression    PERSISTENT LEFT PLEURAL EFFUSION      Radiologist location ID: [id number]     XR AP MOBILE CHEST     Status: None    Narrative    Kirubel LEE Peto    RADIOLOGIST: Berton Bon, MD    XR AP MOBILE CHEST performed on 05/14/2021 6:07 AM    CLINICAL HISTORY: Pleural effusions.  vent management. pleural effusion    TECHNIQUE: Frontal view of the chest.    COMPARISON:  Yesterday    FINDINGS:  ET tube 4.8 cm above the carina.. There is an NG tube extending at least into the distal esophagus and obscured over the upper abdomen. There is a left PICC catheter in the SVC   Heart size is moderately enlarged.     There are perihilar infiltrates with possible effusions obscuring the diaphragms especially on the left  Impression    Stable infiltrates/vascular congestion with probable effusions      Radiologist location ID: [id number]     XR AP MOBILE CHEST     Status: None    Narrative    Baylor LEE Jenifer    RADIOLOGIST: Brayton El, MD    XR AP MOBILE CHEST performed on 05/15/2021 6:04 AM    CLINICAL HISTORY: Pleural effusions.      vent management     TECHNIQUE: Frontal view of the chest.    COMPARISON:  05/13/2021    FINDINGS:  A left PICC ends in the upper SVC. The endotracheal tube ends 4 cm above the carina. The enteric tube ends below the diaphragm.     The heart size is normal.     There is improving aeration of both lungs most notably at both lung bases. A trace left pleural effusion persists.         Impression    Improving aeration of both lungs. The left pleural effusion appears to have decreased in size.       Radiologist location ID: [id number]     XR ABD SUPINE     Status: None    Narrative    Trentin LEE  Petre    RADIOLOGIST: Brayton El, MD    XR ABD SUPINE performed on 05/15/2021 1:02 PM.    CLINICAL HISTORY: r/o ileus - abdominal distention.      ileus    TECHNIQUE: Single view abdomen.    COMPARISON:  None.      FINDINGS:  There is a diffuse gaseous distention of the small bowel. There is also gas within the cecum suggesting this could be an ileus.     No suspicious calcifications.    The bones are unremarkable.        Impression    Diffuse gaseous distention in a pattern suggestive of ileus. The enteric tube ends in the stomach.        Radiologist location ID: [id number]     MRI BRAIN W/WO CONTRAST     Status: None    Narrative    Hernan LEE Pote    RADIOLOGIST: Betsey Amen, MD    MRI BRAIN W/WO CONTRAST performed on 05/15/2021 10:34 PM    CLINICAL HISTORY: inconsistant neuro exam.  Trauma, splenic laceration.  Unable to wake pt calmy since sedated.    TECHNIQUE: Routine brain MRI without and with intravenous contrast.   INTRAVENOUS CONTRAST: 20 ml's of Dotarem    COMPARISON:  None.    FINDINGS:  Brain: Normal signal intensities.    Diffusion weighted images show no evidence of acute or recent infarct.   Postcontrast images show no suspicious enhancement.    Ventricles: Normal.    Major Intracranial Vessels: Normal flow voids.    Sinuses: Clear.  Mastoids: Small amount of fluid signal is identified within both mastoid air cells.          Impression    No acute finding.        Radiologist location ID: [id number]     XR AP MOBILE CHEST     Status: None    Narrative    Vanden LEE Patmon    RADIOLOGIST: Berton Bon, MD    XR AP MOBILE CHEST performed on 05/16/2021 6:55 AM    CLINICAL HISTORY: Pleural effusions.  RESP FAIL-VENT    TECHNIQUE: Frontal view of the chest.    COMPARISON:  Yesterday    FINDINGS:  ET  tube 4.7 cm above the carina. There is an NG tube extending towards the stomach. The tip is not included on this film. There is a left PICC catheter in the SVC   Heart size is moderately enlarged.      There are bilateral perihilar and infrahilar infiltrates with obscuration of the left diaphragm           Impression    Slightly increased infiltrates/vascular congestion      Radiologist location ID: [id number]     XR KUB     Status: None    Narrative    Grantham LEE Kirshner    RADIOLOGIST: Berton Bon, MD    XR KUB performed on 05/16/2021 9:31 AM.    CLINICAL HISTORY: ileus.  evaluate ileus - abdominal distention    TECHNIQUE: Single view abdomen.    COMPARISON:  Yesterday    FINDINGS:  There is gaseous gastric distention. There is some mild diffuse gaseous small bowel distention. Some air is also suggested in the right colon    No suspicious calcifications.    There is lumbar spurring  There is an NG tube with the tip of the proximal stomach         Impression    1.Persistent gaseous intestinal distention suggesting an ileus. There is increased gastric distention since yesterday        Radiologist location ID: [id number]     [place] CHEST (EFFUSION)     Status: None    Narrative    Ester LEE Remmers    RADIOLOGIST: Brian Schambach    [place] CHEST (EFFUSION) performed on 05/16/2021 2:28 PM    CLINICAL HISTORY: evaluate for thoracentesis.  bilateral chest     COMPARISON: None.    FINDINGS:  Ultrasound was used to evaluate for pleural effusions.    There is a small amount of perihepatic ascites. There is a trace right effusion. No left pleural fluid is seen.       Impression    As above.        Radiologist location ID: [id number]     XR AP MOBILE CHEST     Status: None    Narrative    Choice LEE Hayduk    RADIOLOGIST: Danne Baxter, MD    XR AP MOBILE CHEST performed on 05/17/2021 6:07 AM    CLINICAL HISTORY: respiratory failure.    chest trauma.   fractured ribs.  RESP FAIL-VENT    TECHNIQUE: Frontal view of the chest.    COMPARISON:  Yesterday    FINDINGS:  The support lines and tubes are in stable and satisfactory position.   The heart size is normal.   There is some stable mild opacity at both lung bases.   There is a stable  small left pleural effusion.        Impression    NO SIGNIFICANT CHANGE SINCE THE PRIOR EXAM.      Radiologist location ID: [id number]     XR KUB     Status: None    Narrative    Mindy LEE Natividad    RADIOLOGIST: Danne Baxter, MD    XR KUB performed on 05/17/2021 8:25 AM.    CLINICAL HISTORY: ileus.  evaluate ileus    TECHNIQUE: Single view abdomen.    COMPARISON:  05/16/2021    FINDINGS:  There are markedly distended loops of small bowel with a paucity of air in colon. Findings indicate a small bowel obstruction. Gaseous distention of the stomach has  decreased from the prior exam.    A nasogastric tube is present in the region of the stomach.    There are degenerative changes of the spine.        Impression    PERSISTENT MARKEDLY DISTENDED LOOPS OF SMALL BOWEL SUGGESTING AN OBSTRUCTION.        Radiologist location ID: [id number]     XR AP MOBILE CHEST     Status: None    Narrative    Oswaldo LEE Yonkers    RADIOLOGIST: Ileene Hutchinson    XR AP MOBILE CHEST performed on 05/18/2021 6:50 AM    CLINICAL HISTORY: Chest pain/SOB, Left pleural effusion.  resp fail-vent    TECHNIQUE: Frontal view of the chest.    COMPARISON:  Yesterday    FINDINGS:  The support lines and tubes are in stable and satisfactory position.   The heart size is normal.   Bibasilar opacities and a small left pleural effusion are unchanged.   The bones are unremarkable.        Impression    NO SIGNIFICANT CHANGE SINCE THE PRIOR EXAM.      Radiologist location ID: [id number]     XR KUB     Status: None    Narrative    Damir LEE Kreuser    RADIOLOGIST: Brayton El, MD    XR KUB performed on 05/18/2021 10:21 AM.    CLINICAL HISTORY: f/u ileus.      ileus/vent    TECHNIQUE: Single view abdomen.    COMPARISON:  05/17/2021      FINDINGS:  There is diffuse gaseous distention of the small bowel and colon potentially representing ileus. An enteric tube is seen in the stomach.    There is a Foley catheter.    No suspicious calcifications.    Bibasilar  atelectasis is present. Mild cardiomegaly.           Impression    Diffuse gaseous distention of the bowel suggestive of an ileus.        Radiologist location ID: [id number]     XR KUB     Status: None    Narrative    Randall LEE Daniele    RADIOLOGIST: Danne Baxter, MD    XR KUB performed on 05/19/2021 6:19 AM.    CLINICAL HISTORY: f/u ileus.  re evaluate ileus     TECHNIQUE: Single view abdomen.    COMPARISON:  Yesterday    FINDINGS:  There is a nasogastric tube projecting in the region of the distal body of the stomach.    There are distended gas-filled loops of small bowel with a similar degree of distention. Obstruction is suspected. Ileus is in differential.    There are degenerative changes of the spine.        Impression    NO SIGNIFICANT CHANGE FROM THE PRIOR STUDY.        Radiologist location ID: [id number]     XR AP MOBILE CHEST     Status: None    Narrative    Darren LEE Donaldson    RADIOLOGIST: Danne Baxter, MD    XR AP MOBILE CHEST performed on 05/19/2021 6:20 AM    CLINICAL HISTORY: Chest pain/SOB, Left pleural effusion.  on vent     TECHNIQUE: Frontal view of the chest.    COMPARISON:  Yesterday    FINDINGS:  The support lines and tubes are in stable and satisfactory position.   The heart size is normal.  There is patchy by airspace disease with a layering left pleural effusion. Aeration is similar.           Impression    NO SIGNIFICANT CHANGE SINCE THE PRIOR EXAM.      Radiologist location ID: [id number]     XR AP MOBILE CHEST     Status: None    Narrative    Mostyn LEE Armor    RADIOLOGIST: Kelby Frame    XR AP MOBILE CHEST performed on 05/20/2021 6:09 AM    CLINICAL HISTORY: Chest pain/SOB, Left pleural effusion.  re eval effusions     TECHNIQUE: Frontal view of the chest.    COMPARISON:  Chest x-ray from one day ago    FINDINGS:  Endotracheal tube is no longer seen. NG tube tip is below the diaphragm in the stomach. PICC line tip at the distal SVC is stable   Cardiac and mediastinal contours are  stable.   Patchy infiltrative bilateral changes similar to the prior exam given difference in technique. Hazy appearance left lower chest likely indicating effusion.  Left rib fractures are seen. No obvious pneumothorax           Impression    NO SIGNIFICANT CHANGE SINCE THE PRIOR EXAM. (Extubated)      Radiologist location ID: [id number]        All current radiology studies were reviewed.    PHYSICAL EXAMINATION  Temperature: 37 C (98.6 F)  Heart Rate: 67  BP (Non-Invasive): (!) 157/77  Respiratory Rate: 19  SpO2: 99 %    General:  No obvious discomfort.  No signs of respiratory distress.    HEENT:  Normocephalic.  Orogastric tube in place.  Sclera are nonicteric.    Neck:  No obvious JVD.    Heart:  S1, S2.  No murmurs    Lungs:  Breath sounds are clear bilaterally;  slightly decreased at the bases left greater than right.    Abdomen:  Abdomen is still distended but not as firm as it was.  There are some bowel sounds present.  He still receiving the lactulose enemas.    Extremities:  No significant edema.  No cyanosis or clubbing    Neuro:  Nonfocal.  Responding to questions appropriately.      Orogasttric Tube:  OROGASTRIC     Tube Feedings:  ON HOLD.     PICC LINE:  LEFT ARM    INTRAVENOUS INFUSIONS: 1.  PRECEDEX AND KETAMINE DISCONTINUED.    FOLEY CATHETER:  IN PLACE.   LENGTH OF STAY INPUT AND OUTPUT SHOW A NET NEGATIVE                                                            OF  - 9.8 L.    PROBLEM LIST:   Active Hospital Problems   (*Primary Problem)    Diagnosis   . *Spleen injury   . Status post thoracentesis   . Ileus (CMS HCC)   . Leukopenia   . Encephalopathy   . Serum ammonia increased (CMS [place])   . Pleural effusion   . On mechanically assisted ventilation (CMS HCC)   . Multiple rib fractures   . Left pulmonary contusion   . Closed fracture of transverse process of thoracic vertebra (CMS HCC)   .  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 Thrombocytopenia (CMS HCC)   . Alcohol abuse     -   EXTUBATED ON 05/19/2021    PLAN:  1.  Continue supplemental O2 as needed. IS and pulmonary tolieting  2.  Discontinue orogastric tube. SLP eval  3.  Continue lactulose enemas.  4.  GI prophylaxis and DVT prophylaxis.  5.  Chest x-ray, CBC and chemistry in the morning.  6.  Continue bowel regimen.  7.  Continue IV fluid.  8.  Continue bowel regimen and also continue Reglan.  9.  PT/OT  10.  CCM to sign off. Please consult medicine for medical management    Lynnell Catalan, MD, FACP, [facility]

## 2021-05-20 NOTE — Care Plan (Signed)
Problem: Swallowing Impairment  Goal: Improved Swallowing Without Aspiration  Outcome: Ongoing (see interventions/notes)  Intervention: Optimize Eating and Swallowing  Flowsheets (Taken 05/20/2021 1514)  Aspiration Precautions:   oral hygiene care promoted   other (see comments)  Note: NPO pending further diagnostics d/t severe dysphagia observed at bedside w/ high risk for aspiration      Terance Ice, SLP

## 2021-05-21 ENCOUNTER — Ambulatory Visit: Payer: 59 | Admitting: Internal Medicine

## 2021-05-21 ENCOUNTER — Inpatient Hospital Stay (HOSPITAL_COMMUNITY): Payer: MEDICAID

## 2021-05-21 ENCOUNTER — Other Ambulatory Visit: Payer: Self-pay

## 2021-05-21 DIAGNOSIS — M549 Dorsalgia, unspecified: Secondary | ICD-10-CM

## 2021-05-21 LAB — CBC
HCT: 32.1 % — ABNORMAL LOW (ref 36.0–46.0)
HGB: 10.8 g/dL — ABNORMAL LOW (ref 13.9–16.3)
MCH: 34.3 pg — ABNORMAL HIGH (ref 25.4–34.0)
MCHC: 33.7 g/dL (ref 30.0–37.0)
MCV: 101.8 fL — ABNORMAL HIGH (ref 80.0–100.0)
MPV: 11.2 fL (ref 7.5–11.5)
PLATELETS: 146 10*3/uL (ref 130–400)
RBC: 3.15 10*6/uL — ABNORMAL LOW (ref 4.30–5.90)
RDW: 14.9 % — ABNORMAL HIGH (ref 11.5–14.0)
WBC: 6.8 10*3/uL (ref 4.5–11.5)

## 2021-05-21 LAB — BASIC METABOLIC PANEL
ANION GAP: 5 mmol/L (ref 5–19)
BUN/CREA RATIO: 18 (ref 6–20)
BUN: 9 mg/dL (ref 9–20)
CALCIUM: 8.2 mg/dL — ABNORMAL LOW (ref 8.4–10.2)
CHLORIDE: 115 mmol/L — ABNORMAL HIGH (ref 98–107)
CO2 TOTAL: 19 mmol/L — ABNORMAL LOW (ref 22–30)
CREATININE: 0.49 mg/dL — ABNORMAL LOW (ref 0.66–1.20)
ESTIMATED GFR: >60 mL/min/{1.73_m2} (ref >60)
GLUCOSE: 109 mg/dL — ABNORMAL HIGH (ref 74–106)
POTASSIUM: 3.7 mmol/L (ref 3.5–5.1)
SODIUM: 139 mmol/L (ref 137–145)

## 2021-05-21 LAB — ADULT ROUTINE BLOOD CULTURE, SET OF 2 BOTTLES (BACTERIA AND YEAST): BLOOD CULTURE, ROUTINE: NO GROWTH

## 2021-05-21 LAB — PHOSPHORUS: PHOSPHORUS: 3.7 mg/dL (ref 2.5–4.5)

## 2021-05-21 LAB — MAGNESIUM: MAGNESIUM: 2.1 mg/dL (ref 1.6–2.3)

## 2021-05-21 MED ORDER — LACTULOSE 20 GRAM/30 ML ORAL SOLUTION
30.0000 mL | Freq: Two times a day (BID) | ORAL | Status: DC
Start: 2021-05-21 — End: 2021-05-30
  Administered 2021-05-21 – 2021-05-25 (×9): 30 mL via ORAL
  Administered 2021-05-26: 0 mL via ORAL
  Administered 2021-05-26 – 2021-05-28 (×4): 30 mL via ORAL
  Administered 2021-05-28: 0 mL via ORAL
  Administered 2021-05-29: 30 mL via ORAL
  Filled 2021-05-21 (×14): qty 30

## 2021-05-21 MED ORDER — GADOTERATE MEGLUMINE 0.5 MMOL/ML (376.9 MG/ML) INTRAVENOUS SOLUTION
20.0000 mL | INTRAVENOUS | Status: AC
Start: 2021-05-15 — End: 2021-05-15
  Administered 2021-05-15: 20 mL via INTRAVENOUS

## 2021-05-21 NOTE — Care Plan (Signed)
Patients VSS. Currently on D51/2 NS + 40 KCL @ 80 cc/hr. Dobbhoff tube was placed for medications at this time, will assess MBS tomorrow and evaluate if  tube feedings need to be restarted. Patient had to be placed back into restraints due to Dobbhoff tube being placed. PICC line, foley and rectal tube all remain in place. Transfer to 6TSD were placed and waiting on bed for patient.   Problem: Adult Inpatient Plan of Care  Goal: Plan of Care Review  Recent Flowsheet Documentation  Taken 05/21/2021 0800 by Vevelyn Francois, RN  Plan of Care Reviewed With: patient  Goal: Patient-Specific Goal (Individualized)  Recent Flowsheet Documentation  Taken 05/21/2021 1200 by Vevelyn Francois, RN  Patient-Specific Goals (Include Timeframe): place dobhoff tube and transfer out of ICU  Taken 05/21/2021 0800 by Vevelyn Francois, RN  Individualized Care Needs: reviewed POC  Anxieties, Fears or Concerns: none expressed at this time  Patient-Specific Goals (Include Timeframe): place dobhoff tube and transfer out of ICU  Goal: Absence of Hospital-Acquired Illness or Injury  Intervention: Identify and Manage Fall Risk  Recent Flowsheet Documentation  Taken 05/21/2021 0800 by Vevelyn Francois, RN  Safety Promotion/Fall Prevention:   fall prevention program maintained   safety round/check completed   toileting scheduled  Intervention: Prevent Skin Injury  Recent Flowsheet Documentation  Taken 05/21/2021 0800 by Vevelyn Francois, RN  Body Position: side lying, left  Intervention: Prevent and Manage VTE (Venous Thromboembolism) Risk  Recent Flowsheet Documentation  Taken 05/21/2021 0800 by Vevelyn Francois, RN  VTE Prevention/Management: sequential compression devices on  Taken 05/21/2021 0729 by Vevelyn Francois, RN  VTE Prevention/Management:   sequential compression devices on   dorsiflexion/plantar flexion performed  Intervention: Prevent Infection  Recent Flowsheet Documentation  Taken 05/21/2021 0800 by Vevelyn Francois, RN  Infection Prevention: rest/sleep promoted  Goal:  Optimal Comfort and Wellbeing  Intervention: Provide Person-Centered Care  Recent Flowsheet Documentation  Taken 05/21/2021 0800 by Vevelyn Francois, [place] Relationship/Rapport:   care explained   choices provided   emotional support provided   empathic listening provided   questions answered   questions encouraged   reassurance provided   thoughts/feelings acknowledged     Problem: Fall Injury Risk  Goal: Absence of Fall and Fall-Related Injury  Intervention: Identify and Manage Contributors  Recent Flowsheet Documentation  Taken 05/21/2021 0800 by Vevelyn Francois, RN  Self-Care Promotion: independence encouraged  Medication Review/Management: medications reviewed  Intervention: Promote Injury-Free Environment  Recent Flowsheet Documentation  Taken 05/21/2021 0800 by Vevelyn Francois, RN  Safety Promotion/Fall Prevention:   fall prevention program maintained   safety round/check completed   toileting scheduled     Problem: Fall Injury Risk  Goal: Absence of Fall and Fall-Related Injury  05/21/2021 1502 by Vevelyn Francois, RN  Outcome: Ongoing (see interventions/notes)  05/21/2021 1502 by Vevelyn Francois, RN  Outcome: Ongoing (see interventions/notes)  Intervention: Identify and Manage Contributors  Recent Flowsheet Documentation  Taken 05/21/2021 0800 by Vevelyn Francois, RN  Self-Care Promotion: independence encouraged  Medication Review/Management: medications reviewed  Intervention: Promote Injury-Free Environment  Recent Flowsheet Documentation  Taken 05/21/2021 0800 by Vevelyn Francois, RN  Safety Promotion/Fall Prevention:   fall prevention program maintained   safety round/check completed   toileting scheduled     Problem: Pain Acute  Goal: Acceptable Pain Control and Functional Ability  Intervention: Prevent or Manage Pain  Recent Flowsheet Documentation  Taken 05/21/2021 1200 by Vevelyn Francois, RN  Sensory Stimulation Regulation:   auditory stimulation minimized   television  on   visual stimulation minimized  Taken 05/21/2021 0800 by  Vevelyn Francois, RN  Sensory Stimulation Regulation:   auditory stimulation minimized   visual stimulation minimized   quiet environment promoted  Medication Review/Management: medications reviewed  Intervention: Optimize Psychosocial Wellbeing  Recent Flowsheet Documentation  Taken 05/21/2021 0800 by Vevelyn Francois, RN  Supportive Measures:   active listening utilized   counseling provided   decision-making supported   goal-setting facilitated   positive reinforcement provided   relaxation techniques promoted   problem-solving facilitated     Problem: Device-Related Complication Risk (Mechanical Ventilation, Invasive)  Goal: Optimal Device Function  Intervention: Optimize Device Care and Function  Recent Flowsheet Documentation  Taken 05/21/2021 1200 by Vevelyn Francois, RN  Airway Safety Measures: manual resuscitator/mask/valve in room  Taken 05/21/2021 0800 by Vevelyn Francois, RN  Airway Safety Measures: manual resuscitator/mask/valve in room  Aspiration Precautions:   oral hygiene care promoted   NPO pending swallow screening/evaluation     Problem: Inability to Wean (Mechanical Ventilation, Invasive)  Goal: Mechanical Ventilation Liberation  Intervention: Promote Extubation and Dentist  Recent Flowsheet Documentation  Taken 05/21/2021 0800 by Vevelyn Francois, RN  Medication Review/Management: medications reviewed     Problem: Ventilator-Induced Lung Injury (Mechanical Ventilation, Invasive)  Goal: Absence of Ventilator-Induced Lung Injury  Intervention: Prevent Ventilator-Associated Pneumonia  Recent Flowsheet Documentation  Taken 05/21/2021 0800 by Vevelyn Francois, RN  Oral Care:   oral swabbing   oral care with Chlorhexidine provided   lip lubricant applied  Head of Bed (HOB) Positioning: HOB at 30-45 degrees     Problem: Non-violent/Non-Self Destructive Restraints  Goal: Alternative methods tried prior to restraints  Outcome: Ongoing (see interventions/notes)  Goal: Patient free from injury and  discomfort  Outcome: Ongoing (see interventions/notes)  Goal: Autonomy maintained at the highest possible level  Outcome: Ongoing (see interventions/notes)  Goal: Need for restraints reassessed per policy  Outcome: Ongoing (see interventions/notes)  Goal: Patient education provided  Outcome: Ongoing (see interventions/notes)  Goal: Problem Interventions  Outcome: Ongoing (see interventions/notes)     Problem: Embolism (Orthopaedic Fracture)  Goal: Absence of Embolism Signs and Symptoms  Intervention: Prevent or Manage Embolism Risk  Recent Flowsheet Documentation  Taken 05/21/2021 0800 by Vevelyn Francois, RN  VTE Prevention/Management: sequential compression devices on  Taken 05/21/2021 0729 by Vevelyn Francois, RN  VTE Prevention/Management:   sequential compression devices on   dorsiflexion/plantar flexion performed     Problem: Functional Ability Impaired (Orthopaedic Fracture)  Goal: Optimal Functional Ability  Intervention: Optimize Functional Ability  Recent Flowsheet Documentation  Taken 05/21/2021 0800 by Vevelyn Francois, RN  Self-Care Promotion: independence encouraged  Activity Management: activity adjusted per tolerance  Positioning/Transfer Devices:   pillows   in use  Range of Motion: ROM (range of motion) performed     Problem: Infection (Orthopaedic Fracture)  Goal: Absence of Infection Signs and Symptoms  Intervention: Prevent or Manage Infection  Recent Flowsheet Documentation  Taken 05/21/2021 0800 by Vevelyn Francois, RN  Fever Reduction/Comfort Measures:   lightweight bedding   lightweight clothing  Infection Prevention: rest/sleep promoted     Problem: Respiratory Compromise (Orthopaedic Fracture)  Goal: Effective Oxygenation and Ventilation  Intervention: Promote Airway Secretion Clearance  Recent Flowsheet Documentation  Taken 05/21/2021 1200 by Vevelyn Francois, RN  Cough And Deep Breathing: done with encouragement  Taken 05/21/2021 0800 by Vevelyn Francois, RN  Cough And Deep Breathing: done with encouragement      Problem: Adjustment to Injury (Multiple Trauma)  Goal: Optimal Coping  with Effects of Injury  Intervention: Support Adjustment to Injury  Recent Flowsheet Documentation  Taken 05/21/2021 0800 by Vevelyn Francois, RN  Supportive Measures:   active listening utilized   counseling provided   decision-making supported   goal-setting facilitated   positive reinforcement provided   relaxation techniques promoted   problem-solving facilitated     Problem: Cerebral Tissue Perfusion Risk (Multiple Trauma)  Goal: Effective Cerebral Tissue Perfusion  Intervention: Protect and Optimize Cerebral Perfusion  Recent Flowsheet Documentation  Taken 05/21/2021 1200 by Vevelyn Francois, RN  Sensory Stimulation Regulation:   auditory stimulation minimized   television on   visual stimulation minimized  Taken 05/21/2021 0800 by Vevelyn Francois, RN  Sensory Stimulation Regulation:   auditory stimulation minimized   visual stimulation minimized   quiet environment promoted  Cerebral Perfusion Promotion: blood pressure monitored     Problem: Functional Ability Impaired (Multiple Trauma)  Goal: Optimal Functional Ability  Intervention: Optimize Functional Ability  Recent Flowsheet Documentation  Taken 05/21/2021 0800 by Vevelyn Francois, RN  Self-Care Promotion: independence encouraged  Activity Management: activity adjusted per tolerance  Positioning/Transfer Devices:   pillows   in use  Range of Motion: ROM (range of motion) performed     Problem: Infection (Multiple Trauma)  Goal: Absence of Infection Signs and Symptoms  Intervention: Prevent or Manage Infection  Recent Flowsheet Documentation  Taken 05/21/2021 0800 by Vevelyn Francois, RN  Fever Reduction/Comfort Measures:   lightweight bedding   lightweight clothing  Infection Prevention: rest/sleep promoted     Problem: Respiratory Compromise (Multiple Trauma)  Goal: Effective Oxygenation and Ventilation  Intervention: Promote Airway Secretion Clearance  Recent Flowsheet Documentation  Taken 05/21/2021 1200  by Vevelyn Francois, RN  Cough And Deep Breathing: done with encouragement  Taken 05/21/2021 0800 by Vevelyn Francois, RN  Cough And Deep Breathing: done with encouragement  Activity Management: activity adjusted per tolerance  Intervention: Optimize Oxygenation and Ventilation  Recent Flowsheet Documentation  Taken 05/21/2021 0800 by Vevelyn Francois, RN  Head of Bed [facility]) Positioning: HOB at 30-45 degrees     Problem: Infection (Pneumonia)  Goal: Resolution of Infection Signs and Symptoms  Intervention: Prevent Infection Progression  Recent Flowsheet Documentation  Taken 05/21/2021 0800 by Vevelyn Francois, RN  Fever Reduction/Comfort Measures:   lightweight bedding   lightweight clothing     Problem: Respiratory Compromise (Pneumonia)  Goal: Effective Oxygenation and Ventilation  Intervention: Promote Airway Secretion Clearance  Recent Flowsheet Documentation  Taken 05/21/2021 1200 by Vevelyn Francois, RN  Cough And Deep Breathing: done with encouragement  Taken 05/21/2021 0800 by Vevelyn Francois, RN  Cough And Deep Breathing: done with encouragement  Intervention: Optimize Oxygenation and Ventilation  Recent Flowsheet Documentation  Taken 05/21/2021 0800 by Vevelyn Francois, RN  Head of Bed [facility]) Positioning: HOB at 30-45 degrees     Problem: Skin Injury Risk Increased  Goal: Skin Health and Integrity  Intervention: Optimize Skin Protection  Recent Flowsheet Documentation  Taken 05/21/2021 1200 by Vevelyn Francois, RN  Pressure Reduction Techniques:   (L,M,H,VH)Frequent Turning   heels elevated off bed  Pressure Reduction Devices: heels elevated off bed  Taken 05/21/2021 0800 by Vevelyn Francois, RN  Pressure Reduction Techniques:   (L,M,H,VH)Frequent Turning   heels elevated off bed  Pressure Reduction Devices: heels elevated off bed  Skin Protection:   skin sealant/moisture barrier applied   tubing/devices free from skin contact  Head of Bed (HOB) Positioning: HOB at 30-45 degrees     Problem: Alcohol Withdrawal  Goal: Alcohol Withdrawal  Symptom  Control  Intervention: Minimize or Manage Alcohol Withdrawal Symptoms  Recent Flowsheet Documentation  Taken 05/21/2021 1200 by Vevelyn Francois, RN  Sensory Stimulation Regulation:   auditory stimulation minimized   television on   visual stimulation minimized  Taken 05/21/2021 0800 by Vevelyn Francois, RN  Sensory Stimulation Regulation:   auditory stimulation minimized   visual stimulation minimized   quiet environment promoted  Aspiration Precautions:   oral hygiene care promoted   NPO pending swallow screening/evaluation     Problem: Acute Neurologic Deterioration (Alcohol Withdrawal)  Goal: Optimal Neurologic Function  Intervention: Minimize or Manage Acute Neurologic Symptoms  Recent Flowsheet Documentation  Taken 05/21/2021 1200 by Vevelyn Francois, RN  Sensory Stimulation Regulation:   auditory stimulation minimized   television on   visual stimulation minimized  Taken 05/21/2021 0800 by Vevelyn Francois, RN  Sensory Stimulation Regulation:   auditory stimulation minimized   visual stimulation minimized   quiet environment promoted  Cerebral Perfusion Promotion: blood pressure monitored     Problem: Substance Misuse (Alcohol Withdrawal)  Goal: Readiness for Change Identified  Intervention: Partner to Facilitate Behavior Change  Recent Flowsheet Documentation  Taken 05/21/2021 0800 by Vevelyn Francois, RN  Supportive Measures:   active listening utilized   counseling provided   decision-making supported   goal-setting facilitated   positive reinforcement provided   relaxation techniques promoted   problem-solving facilitated     Problem: Alcohol Withdrawal  Goal: Alcohol Withdrawal Symptom Control  05/21/2021 1502 by Vevelyn Francois, RN  Outcome: Ongoing (see interventions/notes)  05/21/2021 1502 by Vevelyn Francois, RN  Outcome: Ongoing (see interventions/notes)  Intervention: Minimize or Manage Alcohol Withdrawal Symptoms  Recent Flowsheet Documentation  Taken 05/21/2021 1200 by Vevelyn Francois, RN  Sensory Stimulation Regulation:   auditory  stimulation minimized   television on   visual stimulation minimized  Taken 05/21/2021 0800 by Vevelyn Francois, RN  Sensory Stimulation Regulation:   auditory stimulation minimized   visual stimulation minimized   quiet environment promoted  Aspiration Precautions:   oral hygiene care promoted   NPO pending swallow screening/evaluation     Problem: Acute Neurologic Deterioration (Alcohol Withdrawal)  Goal: Optimal Neurologic Function  05/21/2021 1502 by Vevelyn Francois, RN  Outcome: Ongoing (see interventions/notes)  05/21/2021 1502 by Vevelyn Francois, RN  Outcome: Ongoing (see interventions/notes)  Intervention: Minimize or Manage Acute Neurologic Symptoms  Recent Flowsheet Documentation  Taken 05/21/2021 1200 by Vevelyn Francois, RN  Sensory Stimulation Regulation:   auditory stimulation minimized   television on   visual stimulation minimized  Taken 05/21/2021 0800 by Vevelyn Francois, RN  Sensory Stimulation Regulation:   auditory stimulation minimized   visual stimulation minimized   quiet environment promoted  Cerebral Perfusion Promotion: blood pressure monitored     Problem: Substance Misuse (Alcohol Withdrawal)  Goal: Readiness for Change Identified  05/21/2021 1502 by Vevelyn Francois, RN  Outcome: Ongoing (see interventions/notes)  05/21/2021 1502 by Vevelyn Francois, RN  Outcome: Ongoing (see interventions/notes)  Intervention: Partner to [place] Documentation  Taken 05/21/2021 0800 by Vevelyn Francois, RN  Supportive Measures:   active listening utilized   counseling provided   decision-making supported   goal-setting facilitated   positive reinforcement provided   relaxation techniques promoted   problem-solving facilitated     Problem: Gas Exchange Impaired  Goal: Optimal Gas Exchange  05/21/2021 1502 by Vevelyn Francois, RN  Outcome: Ongoing (see interventions/notes)  05/21/2021 1502 by Vevelyn Francois, RN  Outcome: Ongoing (see interventions/notes)  Intervention: Optimize Oxygenation  and Ventilation  Recent  Flowsheet Documentation  Taken 05/21/2021 0800 by Vevelyn Francois, RN  Head of Bed [facility]) Positioning: HOB at 30-45 degrees     Problem: Health Knowledge, Opportunity to Enhance (Adult,Obstetrics,Pediatric)  Goal: Knowledgeable about Health Subject/Topic  Description: Patient will demonstrate the desired outcomes by discharge/transition of care.  05/21/2021 1502 by Vevelyn Francois, RN  Outcome: Ongoing (see interventions/notes)  05/21/2021 1502 by Vevelyn Francois, RN  Outcome: Ongoing (see interventions/notes)  Intervention: Enhance Health Knowledge  Recent Flowsheet Documentation  Taken 05/21/2021 0800 by Vevelyn Francois, RN  Supportive Measures:   active listening utilized   counseling provided   decision-making supported   goal-setting facilitated   positive reinforcement provided   relaxation techniques promoted   problem-solving facilitated  Intervention: Enhance Health Knowledge  Recent Flowsheet Documentation  Taken 05/21/2021 0800 by Vevelyn Francois, RN  Supportive Measures:   active listening utilized   counseling provided   decision-making supported   goal-setting facilitated   positive reinforcement provided   relaxation techniques promoted   problem-solving facilitated     Problem: Swallowing Impairment  Goal: Improved Swallowing Without Aspiration  05/21/2021 1502 by Vevelyn Francois, RN  Outcome: Ongoing (see interventions/notes)  05/21/2021 1502 by Vevelyn Francois, RN  Outcome: Ongoing (see interventions/notes)  Intervention: Optimize Eating and Swallowing  Recent Flowsheet Documentation  Taken 05/21/2021 0800 by Vevelyn Francois, RN  Aspiration Precautions:   oral hygiene care promoted   NPO pending swallow screening/evaluation

## 2021-05-21 NOTE — Care Management Notes (Signed)
Patient remains in ICU on high flow oxygen at this time. He needs a healthcare surrogate. Discharge plans are incomplete.

## 2021-05-21 NOTE — Progress Notes (Signed)
Name: Kevion Fatheree MRN:  [id number]   Date of admission: 05/01/2021 Age: 60 y.o.       Name - Mauricio Dahlen    Date of service - 05/21/2021      Interval history  No acute events overnight.  Patient remains extubated in the ICU and his oxygen requirements are being weaned.  The patient's mental status is still an issue in is slightly foggy this morning however he does answer questions appropriately.  The patient failed speech therapy evaluation for swallowing.  Afebrile.  He does complain of some back pain.    Physical exam  Filed Vitals:    05/21/21 0430 05/21/21 0500 05/21/21 0530 05/21/21 0600   BP: (!) 178/91 (!) 165/89 (!) 161/78 (!) 174/74   Pulse: 75 74 77 77   Resp: 18 18 17 20    Temp:       SpO2: 94% 91% 91% 91%          Intake/Output Summary (Last 24 hours) at 05/21/2021 0846  Last data filed at 05/20/2021 2300  Gross per 24 hour   Intake 1040 ml   Output 3175 ml   Net -2135 ml        General:Patient follows commands GCS 15  Abdomen: soft, nontender, small umbilical hernia,very minimal distension  Skin: No jaundice, lesions, or rashes.  Lungs: Breath sounds are rhonchorous bilaterally with equal  CV: Hemodynamically stable  Extremities:no significant edema        ASSESSMENT & RECOMMENDATIONS:   60y.o.malestatus post fall downstairs sustaining multiple left-sided rib fractures with subcutaneous emphysema,transverse process fracture of thoracic spine,and grade 3 splenic laceration  -the patient does demonstrate some dysphagia which I hope will improve once his mental status improves...  For now we will place any NG tube for GI access and feedings  -Lovenox for DVT prophylaxis   -appreciate the assistance of the ancillary services of PT OT and speech  -appreciate the assistance of the ICU team  - encourage incentive spirometer use and aggressive pulmonary toilet  - we will reach out to the hospitalist service to see if they are willing to take over as primary for this patient since his  traumatic injuries are no longer an issue      Clinton Quant, MD      Note: A portion of this documentation was generated using MMODAL (voice recognition software) and may contain syntax/voice recognition errors.

## 2021-05-21 NOTE — OT Treatment (Signed)
[place]  Occupational Therapy Progress Note    Patient Name: Hector [name]  Date of Birth: 27-Feb-1961  Height:  182.9 cm (6')  Weight:  109 kg (240 lb 15.4 oz)  Room/Bed: 367/A  Payor: [employer] / Plan: [employer] MEDICAID / Product Type: Medicaid MC /     Assessment:       05/21/21 0910   Daily Activity AM-PAC/6-clicks Score   Putting on/Taking off clothing on lower body 1   Bathing 1   Toileting 1   Putting on/Taking off clothing on upper body 1   Personal grooming 1   Eating Meals 1   Raw Score Total 6   Standardized (t-scale) Score 17.07   CMS 0-100% Score 100   CMS Modifier CN         Discharge Needs:   Equipment Recommendation: to be determined    Discharge Disposition: LTACH    Plan:   Continue to follow patient according to established plan of care.  The risks/benefits of therapy have been discussed with the patient/caregiver and he/she is in agreement with the established plan of care.     Subjective & Objective:     Alert, oriented to person/place.  Significant generalized weakness.  OT treatment focus on basic self-care/grooming.  Completed grooming tasks with max hand over hand assist/dependent (wash face/hands, comb hair, oral care with suction swab).  BUE kinetics completed (grip strengthening, AAROM, place/holds) x12 minutes within available ranges.  Tolerated without signs of distress/discomfort.      Pain: No reports of pain.    Level of consciousness alert    Bed mobility D    Grooming max assist/D      Patient education exercise program            Therapist:   Landry Corporal, OT     Start Time: [phone]  End Time: 0933  Total Treatment Time: 23 minutes  Charges Entered: self-care x11, there-ex x12  Department Number: [phone]

## 2021-05-21 NOTE — PT Treatment (Signed)
[facility]  [place], [place] [address]  [phone]         Physical Therapy Acute Care Daily Treatment Record       Date: 05/21/2021  Time: 7121  Patient's Name: Hector [name]  Date of Birth: 12-03-1961  Room Number: 367/A    Pertinent Information since last Physical Therapy visit :    Remains in ICU      Precautions / Limitations :    No known precautions / limitations     Weight Bearing status, if applicable :    No Weightbearing restrictions     Subjective :   "I am hungry"    Pain Rating :   Pre-Treatment Pain: denies pain                                  Orientation & Level of Orientation :    Alert, Awake, Cooperative and Oriented Person      6 Clicks Score :    1.) Turning from back to side while flat in bed without using a bed rail.    Total assistance (1)  2.) Moving from lying on back to sitting on the side of a flat bed without using bed rails.   Total assistance (1)  3.) Moving to and from a bed to a chair, including a wheelchair.    Total assistance (1)  4.) Standing up for chair using your arms (eg, wheelchair or bedside chair).   Total assistance (1)  5.) Walking in hospital room.   Total assistance (1)  6.) Climbing 3-5 steps with a railing.    Total assistance (1)    6 Clicks Plan: Score <97 - PT consult is appropriate; placement may be necessary at discharge      Treatment :         Sitting Balance:                            Standing Balance:                    Stairs:                            Therapeutic Exercise:  Performed ankle pumps, heelslides, hip abduction, short arc quads while supine in bed. Requires constant cues to perform thera ex. Easily distracted & requires multiple cues to remain on task. When asked to 'move ankle up and down' patient often times will attempt to flex knee. difficulty following directions. Questionable increased weakness noted right versus left however difficult to assess. Nursing  informed / aware                 Therapeutic Activity:             Canalith Repositioning:             Other Treatment Interventions:       Daily Assessment of Status :    Remains in ICU. Constant cues for thera ex completion.       Timed Charge Number of Minutes   Neuromuscular Re-Education     Gait Training      Therapeutic Activity      Therapeutic Exercise   8   Canalith Re-positioning  Other     Total Treatment Time  8     Patient Education :            Interdisciplinary Communication :                Discharge Planning :   Long Term Acute Care (LTAC)    Plan :   Continue Physical Therapy program     Jackie Plum, PT, DPT  05/21/2021, 13:05

## 2021-05-21 NOTE — Care Plan (Signed)
[facility]  Nutrition Note    Patient Name: Hector [name]  MRN: [id number] / CSN: [id number]  Age: 60 y.o.  Date of Birth: 07-Mar-1961  Room/Bed: 367/A  Attending Provider: Clinton Quant, MD  Admit Date: 05/01/2021      Pt extubated. Current diet order: NPO.  Per speech therapy swallowing evaluation- pt receiving Dysphagia Pureed/Nectar thick with ST only.  Will continue to monitor for advancement of NPO status.                   Corey Harold, RDLD

## 2021-05-21 NOTE — Speech Therapy (Signed)
[facility]  Inpatient Speech Therapy  [address]  [place], [place] [address]  Phone: [phone]  Fax: [phone]    Inpatient Dysphagia Treatment       Patient subjective: Patient alert in bed, noted on entry that pt. Had pulled out his Dobbhoff. RN notified and she placed it again once swallow tx session was completed and was determined that pt. Needed to remain NPO pending MBS  Current diet consistency at time of treatment: NPO w/ Alternative Means of Nutrition/Hydration   Recent CXR:   Recent Results (from the past 720 hour(s))   XR AP MOBILE CHEST    Collection Time: 05/21/21 12:38 PM    Narrative    Coralyn Mark LEE Popoff    RADIOLOGIST: Dorisann Frames, MD    XR AP MOBILE CHEST performed on 05/21/2021 12:38 PM    CLINICAL HISTORY: dobhoff tube placement.  dobhoff tube placement    TECHNIQUE: Frontal view of the chest.    COMPARISON:  Yesterday    FINDINGS:  Large body habitus degrades image quality.  The Dobbhoff tube can be seen to just below the level of the GE junction. A high KUB may be helpful for further clarifying the position of the tip.  Left PICC is noted.   Cardiac and mediastinal contours are stable.   No significant change in the appearance of the lungs.   Patchy and confluent bilateral pulmonary infiltrates persist with dense consolidation/atelectasis at the left lung base.        Impression    DOBBHOFF TUBE IS DIFFICULT TO SEE THE LOWER MARGIN OF THE IMAGE. A HIGH KUB IMAGE MAY BE HELPFUL FOR FURTHER EVALUATION.      Radiologist location ID: [id number]       Oxygenation: Nasal cannula  Dentition: Natural Dentition  Mucosa Status: Dry and Lingual coating   Oral Care: Oral debridement solution, Oral suction and Suction swabs/toothbrush  Feeding ability during treatment: Liquids presented  and controlled by SLP and Solids presented and controlled by SLP  Position during treatment: Fully upright in bed  Mental Status: Alert, Cooperative and Confused    Therapeutic  Trials/Feeds     Liquid Consistency : Nectar thick/Mildly Thick/Level 2  Route/Volume of Presentation :1/2 TSP x4  Oral Phase: Poor labial seal, Poor bolus stripping and A/P Transit Delayed  Pharyngeal Phase: Audible swallow, Multiple Swallows , Throat Clearing, Changes in respiratory status (increased RR) and Reported sensation of difficulty swallowing. Not appropriate for upgrade at bedside, requires objective diagnostic via MBS tomorrow.     Solid Consistency: puree  Route/Volume of Presentation: 1/2 TSP x4  Oral Phase: Poor labial seal, Poor bolus stripping, A/P Transit Delayed and Bolus organization fair/adequate w/ no significant oral residue/pocketing today. Improved.   Pharyngeal Phase: Multiple Swallows , Throat Clearing and Reported sensation of difficulty swallowing. Not appropriate for upgrade at bedside, requires objective diagnostic via MBS tomorrow.     Treatment Summary     Fair treatment tolerance, Ready for Modified Barium Swallow Study/VFSS, Not yet appropriate for PO diet and Progressing towards goals/qualitative gains in function       Plan     Diet Recommendations: NPO w/ Alternative Means of Nutrition/Hydration   Medication administration: NG TF   Feeding Assistance: N/A d/t NPO status  General plan of care: Therapeutic trials for possible advancement , Further dysphagia diagnostics and Aspiration precautions  Recommended Diagnostics: VFSS/Modified Barium Swallow Study - tomorrow AM  Results & Recommendations/ Education provided to: Nurse regarding Aspiration  risks and precautions and recommendations to remain NPO w/ MBS tomorrow AM. RN cleared MBS to be ordered w/ Dr. Gale Journey   Is continued treatment indicated? Yes; continue current plan of care    Therapist:   Terance Ice, SLP      Dysphagia Treatment Time: 16 minutes

## 2021-05-22 ENCOUNTER — Ambulatory Visit (HOSPITAL_COMMUNITY): Payer: MEDICAID

## 2021-05-22 LAB — BASIC METABOLIC PANEL
ANION GAP: 5 mmol/L (ref 5–19)
BUN/CREA RATIO: 16 (ref 6–20)
BUN: 8 mg/dL — ABNORMAL LOW (ref 9–20)
CALCIUM: 8.3 mg/dL — ABNORMAL LOW (ref 8.4–10.2)
CHLORIDE: 110 mmol/L — ABNORMAL HIGH (ref 98–107)
CO2 TOTAL: 22 mmol/L (ref 22–30)
CREATININE: 0.5 mg/dL — ABNORMAL LOW (ref 0.66–1.20)
ESTIMATED GFR: >60 mL/min/{1.73_m2} (ref >60)
GLUCOSE: 105 mg/dL (ref 74–106)
POTASSIUM: 4.3 mmol/L (ref 3.5–5.1)
SODIUM: 137 mmol/L (ref 137–145)

## 2021-05-22 LAB — CBC
HCT: 31.5 % — ABNORMAL LOW (ref 36.0–46.0)
HGB: 10.4 g/dL — ABNORMAL LOW (ref 13.9–16.3)
MCH: 33.7 pg (ref 25.4–34.0)
MCHC: 32.9 g/dL (ref 30.0–37.0)
MCV: 102.4 fL — ABNORMAL HIGH (ref 80.0–100.0)
MPV: 10.4 fL (ref 7.5–11.5)
PLATELETS: 157 10*3/uL (ref 130–400)
RBC: 3.08 10*6/uL — ABNORMAL LOW (ref 4.30–5.90)
RDW: 14.9 % — ABNORMAL HIGH (ref 11.5–14.0)
WBC: 7.8 10*3/uL (ref 4.5–11.5)

## 2021-05-22 LAB — MAGNESIUM: MAGNESIUM: 2.1 mg/dL (ref 1.6–2.3)

## 2021-05-22 LAB — PHOSPHORUS: PHOSPHORUS: 4 mg/dL (ref 2.5–4.5)

## 2021-05-22 MED ORDER — BARIUM SULFATE 40 % (W/V), 30 % (W/W) ORAL SUSPENSION
20.0000 mL | ORAL | Status: AC
Start: 2021-05-22 — End: 2021-05-22
  Administered 2021-05-22: 20 mL via ORAL

## 2021-05-22 MED ORDER — BARIUM SULFATE 81 % (W/W) ORAL POWDER
30.0000 mL | ORAL | Status: AC
Start: 2021-05-22 — End: 2021-05-22
  Administered 2021-05-22: 30 mL via ORAL

## 2021-05-22 NOTE — PT Treatment (Signed)
[facility]  [place], [place] [address]  [phone]         Physical Therapy Acute Care Daily Treatment Record       Date: 05/22/2021  Time: 1055  Patient's Name: Hector [name]  Date of Birth: [birth year]  Room Number: 367/A    Pertinent Information since last Physical Therapy visit :    No changes per EMR review      Precautions / Limitations :    No known precautions / limitations     Weight Bearing status, if applicable :    No Weightbearing restrictions     Subjective :   "I am ok"     Pain Rating :   Pre-Treatment Pain: denies pain                                     Orientation & Level of Orientation :    Alert, Awake, Cooperative and Oriented Person      6 Clicks Score :    1.) Turning from back to side while flat in bed without using a bed rail.    Total assistance (1)  2.) Moving from lying on back to sitting on the side of a flat bed without using bed rails.   Total assistance (1)  3.) Moving to and from a bed to a chair, including a wheelchair.    Total assistance (1)  4.) Standing up for chair using your arms (eg, wheelchair or bedside chair).   Total assistance (1)  5.) Walking in hospital room.   Total assistance (1)  6.) Climbing 3-5 steps with a railing.    Total assistance (1)    6 Clicks Plan: Score <02 - PT consult is appropriate; placement may be necessary at discharge      Treatment :      Transfers:  Rolling in bed:    Maximum Assist  with assist of 2                                                Supine to sit:   Maximum Assist   with assist of 2                                     Sitting Balance:                            Standing Balance:                    Stairs:                            Therapeutic Exercise:                  Therapeutic Activity:     Sat on edge of bed for about 9 minutes with mod assist of 1. Leaning posteriorly and to the left. Requires assist for static sitting balance. Returned to supine  with max assist of 2. Positioned in bed with max assist of 2.  Canalith Repositioning:             Other Treatment Interventions:       Daily Assessment of Status :    Requires max assist for any attempts of mobility. Significant weakness noted.       Timed Charge Number of Minutes   Neuromuscular Re-Education     Gait Training      Therapeutic Activity  17   Therapeutic Exercise      Canalith Re-positioning      Other     Total Treatment Time  17     Patient Education :            Interdisciplinary Communication :                Discharge Planning :   Long Term Acute Care (LTAC)    Plan :   Continue Physical Therapy program     Jackie Plum, PT, DPT  05/22/2021, 14:23

## 2021-05-22 NOTE — Speech Evaluation (Addendum)
[facility]  Speech Therapy  [place], [place] [address]  Phone: [phone]  Fax: [phone]    Modified Barium Swallow Study     Patient Name: Hector [name]  Date of Birth: [birth year]  Patient MRN: [id number]  Ordering Practitioner: Margaretha Sheffield  Radiologist present for study: Dr. Dwyane Luo    Conclusions/Summary:   Diagnostic statement/summary of study: Patient demonstrated moderate oropharyngeal dysphagia characterized by reduced bolus control, reduced laryngeal elevation and reduced anterior laryngeal movement, absent and/or reduced epiglottic inversion, reduced tongue base retraction.  Patient demonstrated penetration into the larynx, which remained above the vocal folds, with thin liquids and nectar thick liquids.  Patient also demonstrated premature spillage of liquid component of mechanical soft and regular bolus material to the pyriform sinuses with penetration into the larynx during the swallow; this did reach and coat the vocal folds when larger bolus of mechanical soft was swallowed.   See individual trials below for specific details of the study.   Treatment Diagnosis:  Level 3: Moderate Oropharyngeal Dysphagia   Rehab Potential: Good     History & Current Problem      Hector [name] is a 60 y.o. male who     Patient Active Problem List   Diagnosis   . Multiple rib fractures   . Left pulmonary contusion   . Spleen injury   . Closed fracture of transverse process of thoracic vertebra (CMS HCC)   . 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 Thrombocytopenia (CMS HCC)   . Alcohol abuse   . On mechanically assisted ventilation (CMS HCC)   . Pleural effusion   . Encephalopathy   . Serum ammonia increased (CMS [place])   . Ileus (CMS HCC)   . Leukopenia   . Status post thoracentesis       Recent CXR:   Recent Results (from the past 720 hour(s))   XR AP MOBILE CHEST    Collection Time: 05/21/21 12:38 PM    Narrative    Coralyn Mark LEE Vercher    RADIOLOGIST: Dorisann Frames, MD    XR AP MOBILE CHEST performed on 05/21/2021 12:38 PM    CLINICAL HISTORY: dobhoff tube placement.  dobhoff tube placement    TECHNIQUE: Frontal view of the chest.    COMPARISON:  Yesterday    FINDINGS:  Large body habitus degrades image quality.  The Dobbhoff tube can be seen to just below the level of the GE junction. A high KUB may be helpful for further clarifying the position of the tip.  Left PICC is noted.   Cardiac and mediastinal contours are stable.   No significant change in the appearance of the lungs.   Patchy and confluent bilateral pulmonary infiltrates persist with dense consolidation/atelectasis at the left lung base.        Impression    DOBBHOFF TUBE IS DIFFICULT TO SEE THE LOWER MARGIN OF THE IMAGE. A HIGH KUB IMAGE MAY BE HELPFUL FOR FURTHER EVALUATION.      Radiologist location ID: [id number]         Past Medical History:   Diagnosis Date   . Alcohol abuse    . Cirrhosis (CMS [place])    . Delirium, withdrawal, alcoholic (CMS HCC)    . HTN (hypertension)    . Unknown cause of injury     fx nose   . Wears glasses  Subjective:   Patient's diet prior to this hospital admission/illness (if known): Unknown and Unknown  Current diet consistency at time of MBS: NPO and Strict NPO  Was a Bedside Swallowing Evaluation completed prior to this study: Yes  Problems identified or reported warranting MBS: Pool oral prep/transit, Oral residue, Slow oral transit , Swallow Delay, Throat clearing, Coughing, Multiple swallows may indicate pharyngeal impairment , Wet vocal quality, Fatigue/poor endurance with eating/drinking and Confusion affecting swallowing safety/swallowing abilities  Pertinent diagnosis related to dysphagia: Cough      Objective:   Oxygenation: Nasal cannula  Vocal Quality: Age appropriate   Dentition: Natural Dentition  Does patient appear to be managing oral secretions? yes  Communication level: Difficulty expressing self   Mental Status: Alert, Responsive, Pleasant and  Cooperative  Feeding ability during MBS: Able to hold cup and Solids presented and controlled by SLP      Liquid Trials   Liquid Consistency : Thin/Level 0  Route/Volume of Presentation: TSP x2, Single cup sip x2 and Successive straw sips x1  Oral Phase (Specify if route/volume affected a component):  [name] Lip closure: Adequate/No bolus escape  . Tongue control during bolus hold: Spillover/posterior escape of less than half the bolus  . Bolus transport/lingual motion: Slow tongue motion  . Oral residue: Residue collection on oral structures  Pharyngeal Phase (Specify if route/volume affected a component):  [name] Initiation of pharyngeal swallow: Swallow initiated w/ bolus head in the pyriform sinuses   . Soft palate elevation: Intact: no bolus between soft palate and pharyngeal wall  . Laryngeal elevation: Partial elevation  . Anterior hyoid excursion: Partial anterior movement  . Epiglottic movement: No inversion  . Laryngeal vestibule closure: Incomplete; narrow column of Contrast in the laryngeal vestibule  . Pharyngeal stripping wave: Present-complete  . Pharyngeal contraction: Complete  . Pharyngoesophageal segment opening: Complete distension; no obstruction of bolus flow  . Tongue base retraction: Trace column of Contrast between the tongue base and the posterior pharyngeal wall  . Vallecular residue: Mild collection of bolus/contrast   . Pyriform sinus residue: Mild collection of bolus/contrast   . Posterior pharyngeal wall residue: Trace residue  . Were any strategies attempted and were they affective with this consistency?: No   . Penetration/Aspiration Scale: 2. Penetration, contrast remains above the vocal folds, subsequently ejected   Esophageal Phase:  Complete clearance    Liquid Consistency : Nectar thick/Mildly Thick/Level 2  Route/Volume of Presentation: TSP x1, Single cup sip x1 and Successive straw sips x1  Oral Phase (Specify if route/volume affected a component):  [name] Lip closure: Adequate/No bolus  escape  . Tongue control during bolus hold: Spillover/posterior escape of less than half the bolus  . Bolus transport/lingual motion: Slow tongue motion  . Oral residue: Residue collection on oral structures  Pharyngeal Phase (Specify if route/volume affected a component):  [name] Initiation of pharyngeal swallow: Swallow initiated w/ bolus head in the pyriform sinuses   . Soft palate elevation: Intact: no bolus between soft palate and pharyngeal wall  . Laryngeal elevation: Partial elevation  . Anterior hyoid excursion: Partial anterior movement  . Epiglottic movement: No inversion  . Laryngeal vestibule closure: Incomplete; narrow column of Contrast in the laryngeal vestibule  . Pharyngeal stripping wave: Present-complete  . Pharyngeal contraction: Complete  . Pharyngoesophageal segment opening: Complete distension; no obstruction of bolus flow  . Tongue base retraction: Trace column of Contrast between the tongue base and the posterior pharyngeal wall  . Vallecular residue: Mild collection of bolus/contrast   .  Pyriform sinus residue: Mild collection of bolus/contrast   . Posterior pharyngeal wall residue: Trace residue  . Were any strategies attempted and were they affective with this consistency?: No   . Penetration/Aspiration Scale: 3. Penetration, contrast remains above the vocal folds, not ejected   Esophageal Phase:  Complete clearance    Solid Trials   Solid Consistency: puree  Route/Volume of Presentation: Small bites x1 and Large bites x1  Oral Phase (Specify if route/volume affected a component):  [name] Lip closure: Adequate/No bolus escape  . Tongue control during bolus hold: Spillover/posterior escape of less than half the bolus  . Bolus preparation/mastication: Not applicable w/ pureed solid  . Bolus transport/lingual motion: Slow tongue motion  . Oral residue: Residue collection on oral structures  Pharyngeal Phase (Specify if route/volume affected a component):  [name] Initiation of pharyngeal swallow: Swallow  initiated w/ bolus head at the posterior laryngeal surface of epiglottis  . Soft palate elevation: Intact: no bolus between soft palate and pharyngeal wall  . Laryngeal elevation: Partial elevation  . Anterior hyoid excursion: Partial anterior movement  . Epiglottic movement: Complete inversion  . Laryngeal vestibule closure: Complete; no Contrast  in laryngeal vestibule  . Pharyngeal stripping wave: Present-complete  . Pharyngeal contraction: Complete  . Pharyngoesophageal segment opening: Complete distension; no obstruction of bolus flow  . Tongue base retraction: Trace column of Contrast between the tongue base and the posterior pharyngeal wall  . Vallecular residue: Mild collection of bolus/contrast   . Pyriform sinus residue: Mild collection of bolus/contrast   . Posterior pharyngeal wall residue: No residue/complete clearance  . Were any strategies attempted and were they affective with this consistency?: No   . Penetration/Aspiration Scale: 1. Material does not enter airway  Esophageal Phase: Complete clearance    Solid Consistency: mechanical soft and regular  Route/Volume of Presentation: Small bites x2 and large bites x1  Oral Phase (Specify if route/volume affected a component):  [name] Lip closure: Adequate/No bolus escape  . Tongue control during bolus hold: Spillover/posterior escape of less than half the bolus  . Bolus preparation/mastication: Slow and prolonged mastication, but w/ complete recollection of the bolus   . Bolus transport/lingual motion: Slow tongue motion  . Oral residue: Trace residue lining oral structures  Pharyngeal Phase (Specify if route/volume affected a component):  [name] Initiation of pharyngeal swallow: Swallow initiated w/ bolus head in the pyriform sinuses   . Soft palate elevation: Intact: no bolus between soft palate and pharyngeal wall  . Laryngeal elevation: Partial elevation  . Anterior hyoid excursion: Partial anterior movement  . Epiglottic movement: Complete  inversion  . Laryngeal vestibule closure: Incomplete; narrow column of Contrast in the laryngeal vestibule  . Pharyngeal stripping wave: Present-complete  . Pharyngeal contraction: Complete  . Pharyngoesophageal segment opening: Complete distension; no obstruction of bolus flow  . Tongue base retraction: Trace column of Contrast between the tongue base and the posterior pharyngeal wall  . Vallecular residue: Mild collection of bolus/contrast   . Pyriform sinus residue: Mild collection of bolus/contrast   . Posterior pharyngeal wall residue: Trace residue  . Were any strategies attempted and were they affective with this consistency?: No   . Penetration/Aspiration Scale: 5. Penetration, contrast contacts the vocal folds, not ejected   Esophageal Phase: Complete clearance      Goals:   Short Term Dysphagia Goals:  Pt will present w/  less than 10% overt s/sx of aspiration on liquid trials of Thin/Level 0 with ST only, Pt. will present  w/ less than 10% overt s/sx of aspiration on solid trials of Dysphagia Mechanical Soft  w/ ST only, Pt will complete 10/10 bolus control exercises , Pt. will complete 10/10 lingual pullbacks, Pt. will complete 10/10 Mendelsohn Maneuvers , Pt. will complete 10/10 Effortful swallows  and Pt. will complete 10/10 Tongue Press exercises    Long Term Dysphagia Goals: Clients will maintain adequate hydration/nutrition with optimum safety and efficiency of swallowing function P.O. intake without overt s/s of aspiration for the highest appropriate diet level    Plan:   Diet Recommendations: Puree and Nectar Thick/Mildly Thick/ Level 2  Aspiration Precautions/Compensatory Strategies: Elevate HOB to a 90 degree seated position, Straws OK, Single sips, Small bites, Oral care after meals, Up to chair for meals , Swallow each bite prior to taking next bite, Take breaks with SOB , Only feed when fully alert and Reflux precautions/remain upright after meals for 30 mins   Medication administration: Pills  crushed in applesauce/pudding (if crushable)  Feeding Assistance: Dependent feed  General plan of care: Therapeutic trials for possible advancement , Therapeutic exercises to strengthen the swallowing mechanism , Compensatory strategies, Aspiration precautions and Patient/family education/training  Recommended swallowing exercises: Hard/effortful swallow, Lingual Pullbacks, Tongue Press, CTAR/Chin tuck against resistance (Isokinetic), CTAR/Chin tuck against resistance (Isometric w/ hold) and Economist   Recommended Diagnostics: N/A  Results & Recommendations/ Education provided to: Patient and Nurse regarding Aspiration risks and precautions and MBS results/recommendations  Is dysphagia intervention warranted?: Yes  Frequency of treatment: 1-5   days/week until goals met, discharged from this facility, or a plateau in swallow function w/ no further expectation in functional progress    Anticipated discharge destination: [facility]  Patient would benefit from the following referrals: OT and PT    Therapist:   Dahlia Byes, SLP      MBS Evaluation Time: 20 minutes

## 2021-05-22 NOTE — Care Plan (Signed)
Problem: Fall Injury Risk  Goal: Absence of Fall and Fall-Related Injury  Outcome: Ongoing (see interventions/notes)  Intervention: Promote Injury-Free Environment  Recent Flowsheet Documentation  Taken 05/21/2021 2000 by Evie Lacks, RN  Safety Promotion/Fall Prevention: nonskid shoes/slippers when out of bed     Problem: Alcohol Withdrawal  Goal: Alcohol Withdrawal Symptom Control  Outcome: Ongoing (see interventions/notes)  Intervention: Minimize or Manage Alcohol Withdrawal Symptoms  Recent Flowsheet Documentation  Taken 05/21/2021 2000 by Evie Lacks, RN  Sensory Stimulation Regulation:   quiet environment promoted   care clustered   lighting decreased     Problem: Acute Neurologic Deterioration (Alcohol Withdrawal)  Goal: Optimal Neurologic Function  Outcome: Ongoing (see interventions/notes)  Intervention: Minimize or Manage Acute Neurologic Symptoms  Recent Flowsheet Documentation  Taken 05/21/2021 2000 by Evie Lacks, RN  Sensory Stimulation Regulation:   quiet environment promoted   care clustered   lighting decreased     Problem: Substance Misuse (Alcohol Withdrawal)  Goal: Readiness for Change Identified  Outcome: Ongoing (see interventions/notes)     Problem: Gas Exchange Impaired  Goal: Optimal Gas Exchange  Outcome: Ongoing (see interventions/notes)     Problem: Health Knowledge, Opportunity to Enhance (Adult,Obstetrics,Pediatric)  Goal: Knowledgeable about Health Subject/Topic  Description: Patient will demonstrate the desired outcomes by discharge/transition of care.  Outcome: Ongoing (see interventions/notes)     Problem: Swallowing Impairment  Goal: Improved Swallowing Without Aspiration  Outcome: Ongoing (see interventions/notes)     Problem: Non-violent/Non-Self Destructive Restraints  Goal: Alternative methods tried prior to restraints  Outcome: Ongoing (see interventions/notes)  Goal: Patient free from injury and discomfort  Outcome: Ongoing (see interventions/notes)  Goal:  Autonomy maintained at the highest possible level  Outcome: Ongoing (see interventions/notes)  Goal: Need for restraints reassessed per policy  Outcome: Ongoing (see interventions/notes)  Goal: Patient education provided  Outcome: Ongoing (see interventions/notes)  Goal: Problem Interventions  Outcome: Ongoing (see interventions/notes)

## 2021-05-22 NOTE — Progress Notes (Signed)
Name: Hector [name] MRN:  [id number]   Date of admission: 05/01/2021 Age: 60 y.o.       Name - Hector [name]    Date of service - 05/22/2021      Interval history  The patient remains in the ICU however he is posted to the step-down unit and awaiting a bed.  Patient remains extubated and the oxygen is being weaned.  He answers the questions appropriately.  He has no complaints of pain or shortness or breath.  Afebrile.  Hemodynamically stable.  Patient has failed his swallow eval by speech so a Dobbhoff NG tube has been placed for feedings.    Physical exam  Filed Vitals:    05/22/21 0400 05/22/21 0500 05/22/21 0600 05/22/21 0700   BP: 128/70 133/72 (!) 152/74    Pulse: 79 77 77    Resp: 16 15 15     Temp:  37.2 C (99 F)  36.9 C (98.4 F)   SpO2: 96% 96% 95%           Intake/Output Summary (Last 24 hours) at 05/22/2021 0918  Last data filed at 05/22/2021 0600  Gross per 24 hour   Intake 1200 ml   Output 2210 ml   Net -1010 ml        General:Patient follows commands GCS 15  Abdomen: soft, nontender, small umbilical hernia,very minimal distension  Skin: No jaundice, lesions, or rashes.  Lungs: Breath sounds are rhonchorous bilaterally with equal  CV: Hemodynamically stable  Extremities:no significant edema      Labs/Imaging -     XR KUB performed on 05/21/2021 2:11 PM.    CLINICAL HISTORY: dobhoff placement.  for dobhoff tube placement.    TECHNIQUE: Single view abdomen.    COMPARISON:  None.    FINDINGS:  NG tube placement demonstrates Dobbhoff tube to be overlying the stomach appearing to be anatomically positioned.    Gas pattern is nonspecific with a single loop of what appears to be small bowel in the left mid abdomen noted.  No gross calcifications.    IMPRESSION:  Anatomic positioning of NG tube.      ASSESSMENT & RECOMMENDATIONS:   60y.o.malestatus post fall downstairs sustaining multiple left-sided rib fractures with subcutaneous emphysema,transverse process fracture of thoracic  spine,and grade 3 splenic laceration  -the patient does demonstrate some dysphagia which I hope will improve once his mental status improves...  For now patient has a Dobbhoff NG tube and this can be used for medications and feeding  -Lovenox for DVT prophylaxis  -appreciate the assistance of the ancillary services of PT OT and speech  -appreciate the assistance of the ICU team  -encourage incentive spirometer use and aggressive pulmonary toilet  -  when patient is transferred to the floor the hospitalist service can take over as primary    Clinton Quant, MD      Note: A portion of this documentation was generated using MMODAL (voice recognition software) and may contain syntax/voice recognition errors.

## 2021-05-22 NOTE — OT Treatment (Signed)
[facility]  Occupational Therapy Progress Note    Patient Name: Hector [name]  Date of Birth: 05-23-1961  Height:  182.9 cm (6')  Weight:  109 kg (240 lb 15.4 oz)  Room/Bed: 367/A  Payor: [employer]ICA[employer] / Plan: [employer] MEDICAID / Product Type: Medicaid [facility] /     Assessment:       05/22/21 1055   Daily Activity AM-PAC/6-clicks Score   Putting on/Taking off clothing on lower body 1   Bathing 1   Toileting 1   Putting on/Taking off clothing on upper body 1   Personal grooming 2   Eating Meals 1   Raw Score Total 7   Standardized (t-scale) Score 20.13   CMS 0-100% Score 92.44   CMS Modifier CM         Discharge Needs:   Equipment Recommendation: to be determined    Discharge Disposition: [facility]    Plan:   Continue to follow patient according to established plan of care.  The risks/benefits of therapy have been discussed with the patient/caregiver and he/she is in agreement with the established plan of care.     Subjective & Objective:     Alert, oriented to person/place.  O2 via nasal cannula.  OT oriented patient to time/situation.  Dependent donn footies.  Dependent comb hair.  Transferred supine to sit max x2-3.  Sat EOB with min/mod assist x10 minutes.  Completed BLE AROM.    Fatigues easily; returned to supine position max x2-3.  Max assist rolling right<>left for pad placement.    Pain: 3/10 faces scale      Patient education orientation provided            Therapist:   Landry Corporal, OT     Start Time: 1055  End Time: 1112  Total Treatment Time: 17 minutes  Charges Entered: SCx1  Department Number: [id number]

## 2021-05-23 DIAGNOSIS — I110 Hypertensive heart disease with heart failure: Secondary | ICD-10-CM

## 2021-05-23 DIAGNOSIS — J9691 Respiratory failure, unspecified with hypoxia: Secondary | ICD-10-CM

## 2021-05-23 DIAGNOSIS — S36039D Unspecified laceration of spleen, subsequent encounter: Secondary | ICD-10-CM

## 2021-05-23 DIAGNOSIS — K8020 Calculus of gallbladder without cholecystitis without obstruction: Secondary | ICD-10-CM

## 2021-05-23 DIAGNOSIS — Z86711 Personal history of pulmonary embolism: Secondary | ICD-10-CM

## 2021-05-23 DIAGNOSIS — Z9889 Other specified postprocedural states: Secondary | ICD-10-CM

## 2021-05-23 DIAGNOSIS — S51812A Laceration without foreign body of left forearm, initial encounter: Secondary | ICD-10-CM

## 2021-05-23 DIAGNOSIS — R1310 Dysphagia, unspecified: Secondary | ICD-10-CM

## 2021-05-23 DIAGNOSIS — I509 Heart failure, unspecified: Secondary | ICD-10-CM

## 2021-05-23 LAB — CBC
HCT: 29.9 % — ABNORMAL LOW (ref 36.0–46.0)
HGB: 10.1 g/dL — ABNORMAL LOW (ref 13.9–16.3)
MCH: 34.4 pg — ABNORMAL HIGH (ref 25.4–34.0)
MCHC: 33.8 g/dL (ref 30.0–37.0)
MCV: 101.7 fL — ABNORMAL HIGH (ref 80.0–100.0)
MPV: 9.9 fL (ref 7.5–11.5)
PLATELETS: 133 10*3/uL (ref 130–400)
RBC: 2.94 10*6/uL — ABNORMAL LOW (ref 4.30–5.90)
RDW: 14.4 % — ABNORMAL HIGH (ref 11.5–14.0)
WBC: 6.7 10*3/uL (ref 4.5–11.5)

## 2021-05-23 LAB — MAGNESIUM: MAGNESIUM: 2 mg/dL (ref 1.6–2.3)

## 2021-05-23 LAB — PHOSPHORUS: PHOSPHORUS: 4.3 mg/dL (ref 2.5–4.5)

## 2021-05-23 LAB — POC BLOOD GLUCOSE (RESULTS): GLUCOSE, POC: 91 mg/dl (ref 74–106)

## 2021-05-23 MED ORDER — POTASSIUM CHLORIDE 2 MEQ/ML INTRAVENOUS SOLUTION
INTRAVENOUS | Status: DC
Start: 2021-05-23 — End: 2021-05-30
  Filled 2021-05-23 (×7): qty 1000

## 2021-05-23 NOTE — Progress Notes (Signed)
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 thoracentisis.     TECHNIQUE: Frontal view of the chest.    COMPARISON:  Previous exam from today    FINDINGS:  ET tube 5 cm above the carina. There is an NG tube in the stomach. There is a left PICC catheter in the SVC   Heart size is mildly enlarged.     There are perihilar and basilar infiltrates. Possible small effusions obscuring the diaphragms   There are multiple left lateral rib fracture deformities        Impression    Negative for pneumothorax after thoracentesis      Radiologist location ID: [id number]     XR AP MOBILE CHEST     Status: None    Narrative    Machi LEE Klarich    RADIOLOGIST: Otho Najjar, MD    XR AP MOBILE CHEST performed on 05/09/2021 6:15 AM    CLINICAL HISTORY: Mult. L rib fx.s, effusion, intubated.  resp fail-vent, effusion, multiple rib fx's    TECHNIQUE: Frontal view of the chest.    COMPARISON:  05/07/2021    FINDINGS:  The support lines and tubes are in stable and satisfactory position. No pneumothorax.     There is increasing opacity bilaterally in the mid lung fields with infiltrate, effusion and vascular congestion. This could be inflammatory but the symmetry suggests development of pulmonary edema. Clinical correlation recommended.      Impression    Increasing opacity bilaterally suggesting increasing CHF. Clinical correlation recommended.      Radiologist location ID: [id number]     XR AP MOBILE CHEST     Status: None    Narrative     Korin LEE Gubbels    RADIOLOGIST: Colletta Maryland, MD    XR AP MOBILE CHEST performed on 05/10/2021 6:19 AM    CLINICAL HISTORY: respiratory failure.  resp fail-vent    TECHNIQUE: Frontal view of the chest.    COMPARISON:  05/09/2021    FINDINGS:  The support lines and tubes are in stable and satisfactory position.   Cardiac and mediastinal contours are stable.   Airspace disease is present in the mid to lower lungs with probable bilateral pleural effusions. Lung volumes are slightly improved since yesterday           Impression    BILATERAL AIRSPACE DISEASE AND PLEURAL EFFUSIONS WITH SOME IMPROVEMENT SINCE YESTERDAY      Radiologist location ID: [id number]     CT ANGIO CHEST FOR PULMONARY EMBOLUS W IV CONTRAST     Status: None    Narrative    Dion LEE Terrio    RADIOLOGIST: Colletta Maryland, MD    CT ANGIO CHEST FOR PULMONARY EMBOLUS W IV CONTRAST performed on 05/10/2021 11:49 AM    CLINICAL HISTORY: hypoxia.  on the Vent , increased hypoxia. CXR showed increased opacities  Increase in CHF  Lung sounds coarse  . fall on 05-01-21    TECHNIQUE: CTA imaging of the chest with intravenous contrast.  3D reconstructions.  CONTRAST:  100 ml's of Isovue 370    COMPARISON: 05/03/2021  # of known CTs in the past 12 months: 7   # of known Cardiac Nuclear Medicine Studies in the past 12 months: 0         FINDINGS:  Hardware:  There is  an NG tube in the stomach. An ET tube is present in the trachea. There is a left-sided PICC line in the SVC.    Lymph nodes:   No mediastinal, hilar, or axillary lymphadenopathy.    Heart:  Coronary artery calcifications are noted.        RV/LV Diameter Ratio: N/A    Thoracic Aorta:  No thoracic aortic aneurysm or dissection.    Pulmonary Vessels:  No evidence of acute pulmonary emboli through the major subsegmental branches.    Lungs and Airways:  There is patient respiratory motion artifact which limits evaluation of the lungs.      Bilateral lower lobe airspace disease with air bronchograms. Pneumonia  versus atelectasis.      Pleura: Bilateral pleural effusions left larger than right    Upper Abdomen: Cirrhosis with splenomegaly. Small volume of ascites. Probable cholelithiasis.    Bones: Bone windows are unremarkable.        Impression    1.NO PULMONARY EMBOLUS  2.BILATERAL PLEURAL EFFUSIONS  3.BILATERAL LOWER LOBE AIRSPACE DISEASE. PNEUMONIA VERSUS ATELECTASIS      One or more dose reduction techniques were used (e.g., Automated exposure control, adjustment of the mA and/or kV according to patient size, use of iterative reconstruction technique).      Radiologist location ID: [id number]     XR AP MOBILE CHEST     Status: None    Narrative    Deantre LEE Hubers    RADIOLOGIST: Colletta Maryland, MD    XR AP MOBILE CHEST performed on 05/11/2021 5:35 AM    CLINICAL HISTORY: respiratory failure.  resp fail-vent    TECHNIQUE: Frontal view of the chest.    COMPARISON:  Yesterday    FINDINGS:  The support lines and tubes are in stable and satisfactory position.   Cardiac and mediastinal contours are stable.   Bilateral airspace disease with bilateral pleural effusions similar to yesterday           Impression    NO ACUTE FINDINGS.      Radiologist location ID: [id number]     CT BRAIN WO IV CONTRAST     Status: None    Narrative    Rogan LEE Mende    RADIOLOGIST: Colletta Maryland, MD    CT BRAIN WO IV CONTRAST performed on 05/12/2021 2:27 AM    CLINICAL HISTORY: altered mental status.  Pt intubated and per RN very restless this early AM    TECHNIQUE:  Head CT without intravenous contrast.    COMPARISON: 05/01/2021  # of known CTs in the past 12 months: 8   # of known Cardiac Nuclear Medicine Studies in the past 12 months: 0    FINDINGS: This study is degraded by patient motion artifact  There is no acute intracranial hemorrhage, mass effect, or evidence of large acute infarct.    Brain: Low density in the periventricular white matter suggests mild chronic small vessel ischemic changes.    CSF Spaces: Mild generalized cerebral  atrophy     Sinuses/Mastoids:  Clear at visualized levels     Bones: Unremarkable        Impression    CHRONIC CHANGES.  NO ACUTE FINDINGS.       One or more dose reduction techniques were used (e.g., Automated exposure control, adjustment of the mA and/or kV according to patient size, use of iterative reconstruction technique).      Radiologist location ID: [id number]     XR AP MOBILE CHEST  Status: None    Narrative    Gatlyn LEE Bohlin    RADIOLOGIST: Colletta Maryland, MD    XR AP MOBILE CHEST performed on 05/12/2021 6:20 AM    CLINICAL HISTORY: respiratory failure.  vent management     TECHNIQUE: Frontal view of the chest.    COMPARISON:  Yesterday    FINDINGS:  The support lines and tubes are in stable and satisfactory position.   Cardiac and mediastinal contours are stable.   There is a left pleural effusion and bilateral airspace disease with possible right pleural effusion. Mild improvement in the right lung since yesterday.           Impression    BILATERAL AIRSPACE DISEASE AND PLEURAL EFFUSIONS LEFT WORSE      Radiologist location ID: [id number]     XR AP MOBILE CHEST     Status: None    Narrative    Elmond LEE Harp    RADIOLOGIST: Colletta Maryland, MD    XR AP MOBILE CHEST performed on 05/13/2021 8:23 AM    CLINICAL HISTORY: respiratory failure, PNA.  resp.failure, PNA, vent    TECHNIQUE: Frontal view of the chest.    COMPARISON:  Yesterday    FINDINGS:  The support lines and tubes are in stable and satisfactory position.   Cardiac and mediastinal contours are stable.   There is a left pleural effusion unchanged from yesterday. The right lung is improved since yesterday with improved lung volumes.           Impression    PERSISTENT LEFT PLEURAL EFFUSION      Radiologist location ID: [id number]     XR AP MOBILE CHEST     Status: None    Narrative    Massimo LEE Kosh    RADIOLOGIST: Berton Bon, MD    XR AP MOBILE CHEST performed on 05/14/2021 6:07 AM    CLINICAL HISTORY: Pleural effusions.  vent management.  pleural effusion    TECHNIQUE: Frontal view of the chest.    COMPARISON:  Yesterday    FINDINGS:  ET tube 4.8 cm above the carina.. There is an NG tube extending at least into the distal esophagus and obscured over the upper abdomen. There is a left PICC catheter in the SVC   Heart size is moderately enlarged.     There are perihilar infiltrates with possible effusions obscuring the diaphragms especially on the left           Impression    Stable infiltrates/vascular congestion with probable effusions      Radiologist location ID: [id number]     XR AP MOBILE CHEST     Status: None    Narrative    Roye LEE Arrey    RADIOLOGIST: Brayton El, MD    XR AP MOBILE CHEST performed on 05/15/2021 6:04 AM    CLINICAL HISTORY: Pleural effusions.      vent management     TECHNIQUE: Frontal view of the chest.    COMPARISON:  05/13/2021    FINDINGS:  A left PICC ends in the upper SVC. The endotracheal tube ends 4 cm above the carina. The enteric tube ends below the diaphragm.     The heart size is normal.     There is improving aeration of both lungs most notably at both lung bases. A trace left pleural effusion persists.         Impression    Improving aeration of both lungs. The left  pleural effusion appears to have decreased in size.       Radiologist location ID: [id number]     XR ABD SUPINE     Status: None    Narrative    Emmerson LEE Raisanen    RADIOLOGIST: Brayton El, MD    XR ABD SUPINE performed on 05/15/2021 1:02 PM.    CLINICAL HISTORY: r/o ileus - abdominal distention.      ileus    TECHNIQUE: Single view abdomen.    COMPARISON:  None.      FINDINGS:  There is a diffuse gaseous distention of the small bowel. There is also gas within the cecum suggesting this could be an ileus.     No suspicious calcifications.    The bones are unremarkable.        Impression    Diffuse gaseous distention in a pattern suggestive of ileus. The enteric tube ends in the stomach.        Radiologist location ID:  [id number]     MRI BRAIN W/WO CONTRAST     Status: None    Narrative    Axell LEE Sunga    RADIOLOGIST: Betsey Amen, MD    MRI BRAIN W/WO CONTRAST performed on 05/15/2021 10:34 PM    CLINICAL HISTORY: inconsistant neuro exam.  Trauma, splenic laceration.  Unable to wake pt calmy since sedated.    TECHNIQUE: Routine brain MRI without and with intravenous contrast.   INTRAVENOUS CONTRAST: 20 ml's of Dotarem    COMPARISON:  None.    FINDINGS:  Brain: Normal signal intensities.    Diffusion weighted images show no evidence of acute or recent infarct.   Postcontrast images show no suspicious enhancement.    Ventricles: Normal.    Major Intracranial Vessels: Normal flow voids.    Sinuses: Clear.  Mastoids: Small amount of fluid signal is identified within both mastoid air cells.          Impression    No acute finding.        Radiologist location ID: [id number]     XR AP MOBILE CHEST     Status: None    Narrative    Nicolo LEE Gossen    RADIOLOGIST: Berton Bon, MD    XR AP MOBILE CHEST performed on 05/16/2021 6:55 AM    CLINICAL HISTORY: Pleural effusions.  RESP FAIL-VENT    TECHNIQUE: Frontal view of the chest.    COMPARISON:  Yesterday    FINDINGS:  ET tube 4.7 cm above the carina. There is an NG tube extending towards the stomach. The tip is not included on this film. There is a left PICC catheter in the SVC   Heart size is moderately enlarged.     There are bilateral perihilar and infrahilar infiltrates with obscuration of the left diaphragm           Impression    Slightly increased infiltrates/vascular congestion      Radiologist location ID: [id number]     XR KUB     Status: None    Narrative    Mitcheal LEE Reith    RADIOLOGIST: Berton Bon, MD    XR KUB performed on 05/16/2021 9:31 AM.    CLINICAL HISTORY: ileus.  evaluate ileus - abdominal distention    TECHNIQUE: Single view abdomen.    COMPARISON:  Yesterday    FINDINGS:  There is gaseous gastric distention. There is some mild diffuse gaseous small bowel distention.  Some air is also suggested in  the right colon    No suspicious calcifications.    There is lumbar spurring  There is an NG tube with the tip of the proximal stomach         Impression    1.Persistent gaseous intestinal distention suggesting an ileus. There is increased gastric distention since yesterday        Radiologist location ID: [id number]     [place] CHEST (EFFUSION)     Status: None    Narrative    Masaru LEE Mcduffey    RADIOLOGIST: Brian Schambach    [place] CHEST (EFFUSION) performed on 05/16/2021 2:28 PM    CLINICAL HISTORY: evaluate for thoracentesis.  bilateral chest     COMPARISON: None.    FINDINGS:  Ultrasound was used to evaluate for pleural effusions.    There is a small amount of perihepatic ascites. There is a trace right effusion. No left pleural fluid is seen.       Impression    As above.        Radiologist location ID: [id number]     XR AP MOBILE CHEST     Status: None    Narrative    Primitivo LEE Boberg    RADIOLOGIST: Danne Baxter, MD    XR AP MOBILE CHEST performed on 05/17/2021 6:07 AM    CLINICAL HISTORY: respiratory failure.    chest trauma.   fractured ribs.  RESP FAIL-VENT    TECHNIQUE: Frontal view of the chest.    COMPARISON:  Yesterday    FINDINGS:  The support lines and tubes are in stable and satisfactory position.   The heart size is normal.   There is some stable mild opacity at both lung bases.   There is a stable small left pleural effusion.        Impression    NO SIGNIFICANT CHANGE SINCE THE PRIOR EXAM.      Radiologist location ID: [id number]     XR KUB     Status: None    Narrative    Eleazar LEE Walters    RADIOLOGIST: Danne Baxter, MD    XR KUB performed on 05/17/2021 8:25 AM.    CLINICAL HISTORY: ileus.  evaluate ileus    TECHNIQUE: Single view abdomen.    COMPARISON:  05/16/2021    FINDINGS:  There are markedly distended loops of small bowel with a paucity of air in colon. Findings indicate a small bowel obstruction. Gaseous distention of the stomach has decreased from the prior  exam.    A nasogastric tube is present in the region of the stomach.    There are degenerative changes of the spine.        Impression    PERSISTENT MARKEDLY DISTENDED LOOPS OF SMALL BOWEL SUGGESTING AN OBSTRUCTION.        Radiologist location ID: [id number]     XR AP MOBILE CHEST     Status: None    Narrative    Deondra LEE Sportsman    RADIOLOGIST: Ileene Hutchinson    XR AP MOBILE CHEST performed on 05/18/2021 6:50 AM    CLINICAL HISTORY: Chest pain/SOB, Left pleural effusion.  resp fail-vent    TECHNIQUE: Frontal view of the chest.    COMPARISON:  Yesterday    FINDINGS:  The support lines and tubes are in stable and satisfactory position.   The heart size is normal.   Bibasilar opacities and a small left pleural effusion are unchanged.   The bones are unremarkable.  Impression    NO SIGNIFICANT CHANGE SINCE THE PRIOR EXAM.      Radiologist location ID: [id number]     XR KUB     Status: None    Narrative    Rashod LEE Rae    RADIOLOGIST: Brayton El, MD    XR KUB performed on 05/18/2021 10:21 AM.    CLINICAL HISTORY: f/u ileus.      ileus/vent    TECHNIQUE: Single view abdomen.    COMPARISON:  05/17/2021      FINDINGS:  There is diffuse gaseous distention of the small bowel and colon potentially representing ileus. An enteric tube is seen in the stomach.    There is a Foley catheter.    No suspicious calcifications.    Bibasilar atelectasis is present. Mild cardiomegaly.           Impression    Diffuse gaseous distention of the bowel suggestive of an ileus.        Radiologist location ID: [id number]     XR KUB     Status: None    Narrative    Jake LEE Signorelli    RADIOLOGIST: Danne Baxter, MD    XR KUB performed on 05/19/2021 6:19 AM.    CLINICAL HISTORY: f/u ileus.  re evaluate ileus     TECHNIQUE: Single view abdomen.    COMPARISON:  Yesterday    FINDINGS:  There is a nasogastric tube projecting in the region of the distal body of the stomach.    There are distended gas-filled loops of small bowel with a  similar degree of distention. Obstruction is suspected. Ileus is in differential.    There are degenerative changes of the spine.        Impression    NO SIGNIFICANT CHANGE FROM THE PRIOR STUDY.        Radiologist location ID: [id number]     XR AP MOBILE CHEST     Status: None    Narrative    Mingo LEE Dunnaway    RADIOLOGIST: Danne Baxter, MD    XR AP MOBILE CHEST performed on 05/19/2021 6:20 AM    CLINICAL HISTORY: Chest pain/SOB, Left pleural effusion.  on vent     TECHNIQUE: Frontal view of the chest.    COMPARISON:  Yesterday    FINDINGS:  The support lines and tubes are in stable and satisfactory position.   The heart size is normal.   There is patchy by airspace disease with a layering left pleural effusion. Aeration is similar.           Impression    NO SIGNIFICANT CHANGE SINCE THE PRIOR EXAM.      Radiologist location ID: [id number]     XR AP MOBILE CHEST     Status: None    Narrative    Evander LEE Arkwright    RADIOLOGIST: Kelby Frame    XR AP MOBILE CHEST performed on 05/20/2021 6:09 AM    CLINICAL HISTORY: Chest pain/SOB, Left pleural effusion.  re eval effusions     TECHNIQUE: Frontal view of the chest.    COMPARISON:  Chest x-ray from one day ago    FINDINGS:  Endotracheal tube is no longer seen. NG tube tip is below the diaphragm in the stomach. PICC line tip at the distal SVC is stable   Cardiac and mediastinal contours are stable.   Patchy infiltrative bilateral changes similar to the prior exam given difference in technique. Hazy appearance left lower  chest likely indicating effusion.  Left rib fractures are seen. No obvious pneumothorax           Impression    NO SIGNIFICANT CHANGE SINCE THE PRIOR EXAM. (Extubated)      Radiologist location ID: [id number]     XR AP MOBILE CHEST     Status: None    Narrative    Willy LEE Hassan    RADIOLOGIST: Dorisann Frames, MD    XR AP MOBILE CHEST performed on 05/21/2021 12:38 PM    CLINICAL HISTORY: dobhoff tube placement.  dobhoff tube placement    TECHNIQUE:  Frontal view of the chest.    COMPARISON:  Yesterday    FINDINGS:  Large body habitus degrades image quality.  The Dobbhoff tube can be seen to just below the level of the GE junction. A high KUB may be helpful for further clarifying the position of the tip.  Left PICC is noted.   Cardiac and mediastinal contours are stable.   No significant change in the appearance of the lungs.   Patchy and confluent bilateral pulmonary infiltrates persist with dense consolidation/atelectasis at the left lung base.        Impression    DOBBHOFF TUBE IS DIFFICULT TO SEE THE LOWER MARGIN OF THE IMAGE. A HIGH KUB IMAGE MAY BE HELPFUL FOR FURTHER EVALUATION.      Radiologist location ID: [id number]     XR KUB     Status: None    Narrative    Julyan LEE Saetern    RADIOLOGIST: Otho Najjar, MD    XR KUB performed on 05/21/2021 2:11 PM.    CLINICAL HISTORY: dobhoff placement.  for dobhoff tube placement.    TECHNIQUE: Single view abdomen.    COMPARISON:  None.    FINDINGS:  NG tube placement demonstrates Dobbhoff tube to be overlying the stomach appearing to be anatomically positioned.    Gas pattern is nonspecific with a single loop of what appears to be small bowel in the left mid abdomen noted.  No gross calcifications.      Impression    Anatomic positioning of NG tube.      Radiologist location ID: [id number]     FLUORO ESOPHAGRAM, MODIFIED SWALLOW     Status: None    Narrative    Deunta LEE Reitter    RADIOLOGIST: Otho Najjar, MD    FLUORO ESOPHAGRAM, MODIFIED SWALLOW performed on 05/22/2021 1:57 PM    CLINICAL HISTORY: dysphagia, cough post prolonged intubation. . .  dysphagia w/cough    TECHNIQUE:  Modified barium swallow was performed in conjunction with a member of the speech pathology department using various consistencies of barium and lateral fluoroscopic observation.     FLUOROSCOPIC TIME:  1.1 minutes  FLUOROGRAPHIC IMAGES: 13    FINDINGS:   The patient was given thin barium, nectar thick barium, applesauce, fruit  and wafers mixed with barium.       Impression    Impression:  Thin barium:  Spoon: No penetration or aspiration.  Cup: No penetration or aspiration.  Straw: Penetration.    Nectar thick barium:  Spoon: Penetration area  Cup: No penetration or aspiration.  Straw: No penetration or aspiration.    Applesauce: No aspiration or penetration.  Fruit: After liquid barium squeezed out from the fruit, slight penetration.  Wafers mixed with barium: After liquid barium squeezed out, slight penetration.    PLEASE REFER TO SPEECH PATHOLOGIST'S REPORT FOR ADDITIONAL DETAILS.  Radiologist location ID: [id number]        All current radiology studies were reviewed.    PHYSICAL EXAMINATION  Temperature: 37.4 C (99.3 F)  Heart Rate: 83  BP (Non-Invasive): 137/71  Respiratory Rate: 16  SpO2: 93 %    General:  No obvious discomfort.  No signs of respiratory distress.    HEENT:  Normocephalic.  Orogastric tube in place.  Sclera are nonicteric.    Neck:  No obvious JVD.    Heart:  S1, S2.  No murmurs    Lungs:  Breath sounds are clear bilaterally;  slightly decreased at the bases left greater than right.    Abdomen:  Abdomen is still distended but not as firm as it was.  There are some bowel sounds present.  He still receiving the lactulose enemas.    Extremities:  No significant edema.  No cyanosis or clubbing    Neuro:  Nonfocal.  Responding to questions appropriately.      Orogasttric Tube:  OROGASTRIC     Tube Feedings:  ON HOLD.     PICC LINE:  LEFT ARM    INTRAVENOUS INFUSIONS: 1.  PRECEDEX AND KETAMINE DISCONTINUED.    FOLEY CATHETER:  IN PLACE.   LENGTH OF STAY INPUT AND OUTPUT SHOW A NET NEGATIVE                                                            OF  - 9.8 L.    PROBLEM LIST:   Active Hospital Problems   (*Primary Problem)    Diagnosis   . *Spleen injury   . Status post thoracentesis   . Ileus (CMS HCC)   . Leukopenia   . Encephalopathy   . Serum ammonia increased (CMS [facility])   . Pleural effusion   . On mechanically  assisted ventilation (CMS HCC)   . Multiple rib fractures   . Left pulmonary contusion   . Closed fracture of transverse process of thoracic vertebra (CMS HCC)   . 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 Thrombocytopenia (CMS HCC)   . Alcohol abuse     -  EXTUBATED ON 05/19/2021    PLAN:  1.  Continue supplemental O2 as needed. IS and pulmonary tolieting  2.  Discontinue orogastric tube. SLP eval  3.  Continue lactulose enemas.  4.  GI prophylaxis and DVT prophylaxis.  5.  Chest x-ray, CBC and chemistry in the morning.  6.  Continue bowel regimen.  7.  Continue IV fluid.  8.  Continue bowel regimen and also continue Reglan.  9.  PT/OT    Natalia Zavodchikov, DO

## 2021-05-23 NOTE — Speech Therapy (Signed)
[facility]  Inpatient Speech Therapy  [place]  [place], [place] [address]  Phone: [phone]  Fax: [phone]- [phone]    Inpatient Dysphagia Treatment       Patient subjective: Prior to treatment, noted that the physician cx'd the pureed/nectar diet and placed pt. On a regular/nectar diet. RN reports that the pt's breakfast was pureed. Wife present as a regular tray arrived and she agreed that the pt. Cannot tolerate solid foods at this time. Resumed puree/nectar diet and notified RN.   Current diet consistency at time of treatment: Puree and Nectar Thick/Mildly Thick/ Level 2  Recent CXR:   Recent Results (from the past 720 hour(s))   XR AP MOBILE CHEST    Collection Time: 05/21/21 12:38 PM    Narrative    Coralyn Mark LEE Caponi    RADIOLOGIST: Dorisann Frames, MD    XR AP MOBILE CHEST performed on 05/21/2021 12:38 PM    CLINICAL HISTORY: dobhoff tube placement.  dobhoff tube placement    TECHNIQUE: Frontal view of the chest.    COMPARISON:  Yesterday    FINDINGS:  Large body habitus degrades image quality.  The Dobbhoff tube can be seen to just below the level of the GE junction. A high KUB may be helpful for further clarifying the position of the tip.  Left PICC is noted.   Cardiac and mediastinal contours are stable.   No significant change in the appearance of the lungs.   Patchy and confluent bilateral pulmonary infiltrates persist with dense consolidation/atelectasis at the left lung base.        Impression    DOBBHOFF TUBE IS DIFFICULT TO SEE THE LOWER MARGIN OF THE IMAGE. A HIGH KUB IMAGE MAY BE HELPFUL FOR FURTHER EVALUATION.      Radiologist location ID: [id number]       Oxygenation: Nasal cannula  Dentition: Natural Dentition  Mucosa Status: Clean/Moist   Oral Care: NA/Not completed   Feeding ability during treatment: Able to hold cup and Solids presented and controlled by SLP ; somewhat impulsive w/ holding cup and controlling his own drinks  Position during treatment: Fully  upright in bed  Mental Status: Alert, Confused and Distracted    Therapeutic Trials/Feeds     Liquid Consistency : Thin/Level 0  Route/Volume of Presentation :Single cup sip x7 and Successive cup sips x3 (3 sips x3)  Oral Phase: Poor labial seal, Anterior loss medially and disorganized bolus propulsion  Pharyngeal Phase: Throat Clearing, Coughing and Wet vocal quality especially w/ self feeding. Pt. Unable to follow cues to cough/clear. Tolerated better w/ SLP controlling single cup sips    Liquid Consistency : Nectar thick/Mildly Thick/Level 2  Route/Volume of Presentation :TSP x5 and Single cup sip x7  Oral Phase: A/P Transit Delayed  Pharyngeal Phase: Throat Clearing and Coughing observed w/ single cup sips, but tolerated well via controlled tsp w/o any overt s/s of aspiration. Recommend remain on nectar via tsp only.     Solid Consistency: puree  Route/Volume of Presentation: 1/2 TSP x5  Oral Phase: A/P Transit Delayed  Pharyngeal Phase: Appears WFL, no significant pharyngeal dysphagia noted at bedside with this consistency     Pt. Declined to take solid trials this date. He did not want anything to eat/drink and required encouragement to take PO trials.     Treatment Summary     Fair treatment tolerance, Limited participation due mental status, Patient declines to participate in some aspects of treatment  and Stable  to remain on current diet      Plan     Diet Recommendations: Puree and Nectar Thick/Mildly Thick/ Level 2  Aspiration Precautions/Compensatory Strategies: Elevate HOB to a 90 degree seated position, No straws, TSP only, Small bites, Swallow each bite prior to taking next bite and Only feed when fully alert  Medication administration: Pills crushed in applesauce/pudding (if crushable)  Feeding Assistance: Recommended that staff feed d/t aspiration risk and Recommend that staff feed d/t physical self feeding limitations  General plan of care: Therapeutic trials for possible advancement , Therapeutic  exercises to strengthen the swallowing mechanism , Compensatory strategies, Aspiration precautions and Patient/family education/training  Recommended Diagnostics: N/A  Results & Recommendations/ Education provided to: Patient, Spouse and Nurse regarding Current diet, Aspiration risks and precautions and Dysphagia treatment progress  Is continued treatment indicated? Yes; continue current plan of care    Therapist:   Terance Ice, SLP      Dysphagia Treatment Time: 20 minutes

## 2021-05-23 NOTE — Progress Notes (Signed)
Name: Hector [name] MRN:  [id number]   Date of admission: 05/01/2021 Age: 60 y.o.       Name - Hector [name]    Date of service - 05/23/2021      Interval history  No acute events overnight.  Patient remains in the ICU however he is posted to the step-down unit awaiting a bed.  Patient passed his swallow evaluation for dysphagia regular diet.  His mental status is still foggy however he does answer questions appropriately.  Foley catheter in place.  He has no complaints this morning.  Afebrile    Physical exam  Filed Vitals:    05/23/21 0500 05/23/21 0530 05/23/21 0600 05/23/21 0800   BP: (!) 175/95 (!) 156/85 (!) 160/80    Pulse: 80 74 77    Resp: 19 (!) 25 18    Temp:    36.5 C (97.7 F)   SpO2: 97% 95% 94%           Intake/Output Summary (Last 24 hours) at 05/23/2021 0925  Last data filed at 05/23/2021 0500  Gross per 24 hour   Intake 900 ml   Output 2235 ml   Net -1335 ml        General:Patient follows commands GCS 15  Abdomen: soft, nontender, small umbilical hernia,no significant distention  Skin: No jaundice, lesions, or rashes.  Lungs:Respirations unlabored  CV: Hemodynamically stable  Extremities:no significant edema      Labs/Imaging -   reviewed      ASSESSMENT & RECOMMENDATIONS:   60y.o.malestatus post fall downstairs sustaining multiple left-sided rib fractures with subcutaneous emphysema,transverse process fracture of thoracic spine,and grade 3 splenic laceration  -patient can be advanced to a dysphagia diet and the NG tube can be removed  -Lovenox for DVT prophylaxis  -appreciate the assistance of theancillary services of PT OTand speech  -encourage incentive spirometer use and aggressive pulmonary toilet  - when patient is transferred to the floor the hospitalist service can take over as primary    Clinton Quant, MD      Note: A portion of this documentation was generated using MMODAL (voice recognition software) and may contain syntax/voice recognition errors.

## 2021-05-23 NOTE — OT Treatment (Signed)
[facility]  Occupational Therapy Progress Note    Patient Name: Hector [name]  Date of Birth: 21-Jul-1961  Height:  182.9 cm (6')  Weight:  109 kg (240 lb 15.4 oz)  Room/Bed: 367/A  Payor: HEALTH PLAN MEDICAID / Plan: HEALTH PLAN MEDICAID / Product Type: Medicaid [place] /     Assessment:       05/23/21 1049   Daily Activity AM-PAC/6-clicks Score   Putting on/Taking off clothing on lower body 1   Bathing 1   Toileting 1   Putting on/Taking off clothing on upper body 2   Personal grooming 2   Eating Meals 2   Raw Score Total 9   Standardized (t-scale) Score 25.33   CMS 0-100% Score 79.59   CMS Modifier CL         Discharge Needs:   Equipment Recommendation: to be determined    Discharge Disposition: LTACH/SNF    Plan:   Continue to follow patient according to established plan of care.  The risks/benefits of therapy have been discussed with the patient/caregiver and he/she is in agreement with the established plan of care.     Subjective & Objective:     Alert, oriented to person/place.  Confused this morning.  Provided orientation information.  Treatment focus on basic ADL tasks and bed mobility.  Grooming tasks max assist comb hair, wash face/hands.  Supine to sit max x2.  Sat EOB min assist.  Sit to supine max x2.    Pain: No reports of pain    Level of consciousness alert    Orientation person/place    Bed mobility max x2      Patient education orientation provided.            Therapist:   Landry Corporal, OT     Start Time: 0174  End Time: 1103  Total Treatment Time: 14 minutes  Charges Entered: scx1  Department Number: [phone]

## 2021-05-23 NOTE — Care Management Notes (Signed)
05/23/21 1402   Assessment Details   Assessment Type Continued Assessment   Date of Care Management Update 05/23/21   Care Management Plan   Discharge Planning Status plan in progress   Welcome Management Note    Patient Name: Hector [name]  Date of Birth: [birth year]  Sex: male  Date/Time of Admission: 05/01/2021  9:05 AM  Room/Bed: 367/A  Payor: [place] / Plan: [place] / Product Type: Medicaid [place] /    LOS: 22 days   Primary Care Providers:  Pcp, No (General)    Admitting Diagnosis:  Multiple rib fractures [S22.49XA]    Assessment:   Patient remains in the ICU awaiting bed on CVSD. PT is recommending LTAC at discharge.     Discharge Plan:  LTAC (code 48)      The patient will continue to be evaluated for developing discharge needs.     Case Manager: Kem Kays, RN  Phone: [phone]

## 2021-05-23 NOTE — PT Treatment (Signed)
[facility] at [place], [place] [address]  [phone]         Physical Therapy Acute Care Daily Treatment Record       Date: 05/23/2021  Time: 2263  Patient's Name: Hector [name]  Date of Birth: 1961/09/14  Room Number: 367/A    Pertinent Information since last Physical Therapy visit :    Remains in ICU      Precautions / Limitations :    No known precautions / limitations     Weight Bearing status, if applicable :    No Weightbearing restrictions     Subjective :   Patient is quite confused - states that he is in [employer]" however patient thinks his dog is in his room; he also is thinking his oxygen cannula is a cup of water and is asking about seeing 'the baby'.     Pain Rating :   Pre-Treatment Pain: denies pain                                    Orientation & Level of Orientation :    Alert, Awake and Oriented Person - responds to name       6 Clicks Score :    1.) Turning from back to side while flat in bed without using a bed rail.    Total assistance (1)  2.) Moving from lying on back to sitting on the side of a flat bed without using bed rails.   Total assistance (1)  3.) Moving to and from a bed to a chair, including a wheelchair.    Total assistance (1)  4.) Standing up for chair using your arms (eg, wheelchair or bedside chair).   Total assistance (1)  5.) Walking in hospital room.   Total assistance (1)  6.) Climbing 3-5 steps with a railing.    Total assistance (1)    6 Clicks Plan: Score <33 - PT consult is appropriate; placement may be necessary at discharge      Treatment :      Transfers:  Rolling in bed:    Maximum Assist  with assist of 2                                                Supine to sit:   Maximum Assist   with assist of 2                                     Sitting Balance:                            Standing Balance:                    Stairs:                            Therapeutic Exercise:                   Therapeutic Activity:    Transfer to edge of bed  with max assist of 2. Able to maintain static sitting balance for a few seconds without assist. Tends to lean anteriorly and posteriorly. Unable to follow commands. Difficult to re-direct. Returned to supine with max assist of 2. Rolling in bed with max assist of  2.         Canalith Repositioning:             Other Treatment Interventions:       Daily Assessment of Status :      Requires max assist of 2 for any attempts of mobility. Returned to supine and bed alarm set.     Timed Charge Number of Minutes   Neuromuscular Re-Education     Gait Training      Therapeutic Activity  18   Therapeutic Exercise       Canalith Re-positioning      Other     Total Treatment Time  18     Patient Education :            Interdisciplinary Communication :                Discharge Planning :   Inpatient Rehabilitation  or [place] (LTAC)    Plan :   Continue Physical Therapy program     Jackie Plum, PT, DPT  05/23/2021, 14:18

## 2021-05-23 NOTE — Care Plan (Signed)
Problem: Non-violent/Non-Self Destructive Restraints  Goal: Alternative methods tried prior to restraints  Outcome: Outcome Achieved  Goal: Patient free from injury and discomfort  Outcome: Outcome Achieved  Goal: Autonomy maintained at the highest possible level  Outcome: Outcome Achieved  Goal: Need for restraints reassessed per policy  Outcome: Outcome Achieved  Goal: Patient education provided  Outcome: Outcome Achieved  Goal: Problem Interventions  Outcome: Outcome Achieved

## 2021-05-23 NOTE — Care Plan (Signed)
Problem: Fall Injury Risk  Goal: Absence of Fall and Fall-Related Injury  Outcome: Ongoing (see interventions/notes)     Problem: Alcohol Withdrawal  Goal: Alcohol Withdrawal Symptom Control  Outcome: Ongoing (see interventions/notes)  Intervention: Minimize or Manage Alcohol Withdrawal Symptoms  Recent Flowsheet Documentation  Taken 05/23/2021 0830 by Valere Dross, RN  Sensory Stimulation Regulation:   care clustered   quiet environment promoted     Problem: Acute Neurologic Deterioration (Alcohol Withdrawal)  Goal: Optimal Neurologic Function  Outcome: Ongoing (see interventions/notes)  Intervention: Minimize or Manage Acute Neurologic Symptoms  Recent Flowsheet Documentation  Taken 05/23/2021 0830 by Valere Dross, RN  Sensory Stimulation Regulation:   care clustered   quiet environment promoted  Cerebral Perfusion Promotion: blood pressure monitored     Problem: Substance Misuse (Alcohol Withdrawal)  Goal: Readiness for Change Identified  Outcome: Ongoing (see interventions/notes)     Problem: Gas Exchange Impaired  Goal: Optimal Gas Exchange  Outcome: Ongoing (see interventions/notes)     Problem: Health Knowledge, Opportunity to Enhance (Adult,Obstetrics,Pediatric)  Goal: Knowledgeable about Health Subject/Topic  Description: Patient will demonstrate the desired outcomes by discharge/transition of care.  Outcome: Ongoing (see interventions/notes)     Problem: Swallowing Impairment  Goal: Improved Swallowing Without Aspiration  Outcome: Ongoing (see interventions/notes)

## 2021-05-24 DIAGNOSIS — J942 Hemothorax: Secondary | ICD-10-CM

## 2021-05-24 DIAGNOSIS — G319 Degenerative disease of nervous system, unspecified: Secondary | ICD-10-CM

## 2021-05-24 LAB — CBC
HCT: 29.4 % — ABNORMAL LOW (ref 36.0–46.0)
HGB: 9.7 g/dL — ABNORMAL LOW (ref 13.9–16.3)
MCH: 33.7 pg (ref 25.4–34.0)
MCHC: 33 g/dL (ref 30.0–37.0)
MCV: 102 fL — ABNORMAL HIGH (ref 80.0–100.0)
MPV: 9.6 fL (ref 7.5–11.5)
PLATELETS: 119 10*3/uL — ABNORMAL LOW (ref 130–400)
RBC: 2.89 10*6/uL — ABNORMAL LOW (ref 4.30–5.90)
RDW: 14.6 % — ABNORMAL HIGH (ref 11.5–14.0)
WBC: 6.9 10*3/uL (ref 4.5–11.5)

## 2021-05-24 LAB — BASIC METABOLIC PANEL
ANION GAP: 7 mmol/L (ref 5–19)
BUN/CREA RATIO: 12 (ref 6–20)
BUN: 6 mg/dL — ABNORMAL LOW (ref 9–20)
CALCIUM: 8.4 mg/dL (ref 8.4–10.2)
CHLORIDE: 109 mmol/L — ABNORMAL HIGH (ref 98–107)
CO2 TOTAL: 22 mmol/L (ref 22–30)
CREATININE: 0.52 mg/dL — ABNORMAL LOW (ref 0.66–1.20)
ESTIMATED GFR: >60 mL/min/{1.73_m2} (ref >60)
GLUCOSE: 91 mg/dL (ref 74–106)
POTASSIUM: 3.8 mmol/L (ref 3.5–5.1)
SODIUM: 138 mmol/L (ref 137–145)

## 2021-05-24 LAB — PHOSPHORUS: PHOSPHORUS: 4.2 mg/dL (ref 2.5–4.5)

## 2021-05-24 LAB — MAGNESIUM: MAGNESIUM: 2 mg/dL (ref 1.6–2.3)

## 2021-05-24 NOTE — Care Plan (Signed)
Problem: Fall Injury Risk  Goal: Absence of Fall and Fall-Related Injury  Outcome: Ongoing (see interventions/notes)     Problem: Alcohol Withdrawal  Goal: Alcohol Withdrawal Symptom Control  Outcome: Ongoing (see interventions/notes)     Problem: Acute Neurologic Deterioration (Alcohol Withdrawal)  Goal: Optimal Neurologic Function  Outcome: Ongoing (see interventions/notes)     Problem: Substance Misuse (Alcohol Withdrawal)  Goal: Readiness for Change Identified  Outcome: Ongoing (see interventions/notes)     Problem: Gas Exchange Impaired  Goal: Optimal Gas Exchange  Outcome: Ongoing (see interventions/notes)     Problem: Health Knowledge, Opportunity to Enhance (Adult,Obstetrics,Pediatric)  Goal: Knowledgeable about Health Subject/Topic  Description: Patient will demonstrate the desired outcomes by discharge/transition of care.  Outcome: Ongoing (see interventions/notes)     Problem: Swallowing Impairment  Goal: Improved Swallowing Without Aspiration  Outcome: Ongoing (see interventions/notes)

## 2021-05-24 NOTE — Care Plan (Signed)
[facility] Nutrition Therapy Assessment      Reason for Assessment: Follow up    SUBJECTIVE :   Pt sleeping and then woke up. Pt does not like nectar thickened liquids.   Pt will not eat chocolate pudding; orange juice or apples juice per patient's RN.  RN fed pt lunch with intake of mashed potatoes and [place].     OBJECTIVE: ROOM SERVICE:  SEND AUTOMATIC HOUSE TRAY  DIET REGULAR Liquid Dysphagia Modifications: NECTAR THICK (Mildly Thick); Solids Dysphagia Modifications: PUREE; Special Tray Requirements: NO STRAWS; Instructional/informational: CHECK ORAL CAVITY FOR RESIDUALS  , FEED PATIENT WITH EACH MEAL, MED...    Poor po intake.Pt needs fed by staffing.       Comments:      PLAN / INTERVENTION        Goals:  Oral Intake: greater than/equal to 75% % of meals    Monitor / Evaluate: Monitor: Oral Intake    Recommend : Ordered nutrition supplement: magic cup and gelatein each meal.  Recommend monitor % oral intake.     Nutrition Diagnosis: Inadequate energy intake ; related to Pathophysiological  Liver dysfunction and Increased energy needs ; as evidenced by Decreased oral intake    Corey Harold, RDLD 05/24/2021, 16:11

## 2021-05-24 NOTE — PT Treatment (Signed)
[facility] at [place]  [place], [place] [address]  [phone]         Physical Therapy Acute Care Daily Treatment Record       Date: 05/24/2021  Time: 5638  Patient's Name: Hector [name]  Date of Birth: June 19, 1961  Room Number: 367/A    Pertinent Information since last Physical Therapy visit :    Remains in ICU.      Precautions / Limitations :    No known precautions / limitations     Weight Bearing status, if applicable :    No Weightbearing restrictions     Subjective :   "I am ok"      Pain Rating :   Pre-Treatment Pain: denies                                    Orientation & Level of Orientation :    Alert, Awake, Cooperative, Confused and CAM ICU is (+) for delirium      6 Clicks Score :    1.) Turning from back to side while flat in bed without using a bed rail.    Total assistance (1)  2.) Moving from lying on back to sitting on the side of a flat bed without using bed rails.   Total assistance (1)  3.) Moving to and from a bed to a chair, including a wheelchair.    Total assistance (1)  4.) Standing up for chair using your arms (eg, wheelchair or bedside chair).   Total assistance (1)  5.) Walking in hospital room.   Total assistance (1)  6.) Climbing 3-5 steps with a railing.    Total assistance (1)    6 Clicks Plan: Score <75 - PT consult is appropriate; placement may be necessary at discharge      Treatment :      Transfers:  Rolling in bed:    Maximum Assist  with assist of 2                                                Supine to sit:   Maximum Assist   with assist of 2                                     Sitting Balance:                            Standing Balance:                    Stairs:                            Therapeutic Exercise:   Performed ankle pumps, heelslides, hip abduction while supine in bed. Requires constant cues for performing the exercise.               Therapeutic Activity:   Transfer to edge of bed with max  assist of 2. Patient was able to maintain static sitting balance for a few moments without assist however for  the most part, requires assist to maintain static sitting balance. Tends to lean to the left. Sat on edge of bed for about 15 minutes with assist. Returned to supine with max assist of 2. Positioned in bed with max assist of 2.           Canalith Repositioning:             Other Treatment Interventions:       Daily Assessment of Status :    Remains in ICU. Max assist for bed mobility. Difficulty maintaining static sitting balance on edge of bed without assist. CAM ICU is (+) for delirium. Patient is quite inconsistent in his ability to follow commands - when asked to 'bend left knee', patient will raise right le.     Timed Charge Number of Minutes   Neuromuscular Re-Education     Gait Training      Therapeutic Activity  15   Therapeutic Exercise    10   Canalith Re-positioning      Other     Total Treatment Time  25     Patient Education :            Interdisciplinary Communication :                Discharge Planning :   Inpatient Rehabilitation  or [facility] (LTAC)    Plan :   Continue Physical Therapy program     Jackie Plum, PT, DPT  05/24/2021, 14:04

## 2021-05-24 NOTE — Care Management Notes (Signed)
6/10   Remains in ICU  PICC line, NG  O2 NC  Pt seen  asit to edge of bed leans  Doesn't follow commands  LTAC following   See SS notes   No needs identified ro CM at this time   Will follow

## 2021-05-24 NOTE — OT Treatment (Signed)
[facility]  Occupational Therapy Progress Note    Patient Name: Hector [name]  Date of Birth: May 20, 1961  Height:  182.9 cm (6')  Weight:  109 kg (240 lb 15.4 oz)  Room/Bed: 367/A  Payor: HEALTH PLAN MEDICAID / Plan: HEALTH PLAN MEDICAID / Product Type: Medicaid [place] /     Assessment:       05/24/21 0955   Daily Activity AM-PAC/6-clicks Score   Putting on/Taking off clothing on lower body 1   Bathing 1   Toileting 1   Putting on/Taking off clothing on upper body 1   Personal grooming 2   Eating Meals 1   Raw Score Total 7   Standardized (t-scale) Score 20.13   CMS 0-100% Score 92.44   CMS Modifier CM         Discharge Needs:   Equipment Recommendation: to be determined    Discharge Disposition: LTACH    Plan:   Continue to follow patient according to established plan of care.  The risks/benefits of therapy have been discussed with the patient/caregiver and he/she is in agreement with the established plan of care.     Subjective & Objective:     Alert, oriented to person.  CAM ICU positive.  Inconsistent with following commands.  Dependent donn footies.  Supine to sit max x2.  EOB sitting min assist.  Leans to left at times.  Max assist wash face/hands.  Dependent to comb hair while sitting EOB.  Sit to supine max x2.  Tolerated without signs of distress/discomfort.  Plan:  will follow and progress as indicated and tolerated.      Pain: No reports of pain    Level of consciousness alert    Orientation person/place    Bed mobility max x2    Bathing D  Dressing D  Toileting D  Grooming max assist    Patient education orientation provided            Therapist:   Landry Corporal, OT     Start Time: [phone]  End Time: 1016  Total Treatment Time: 26 minutes  Charges Entered: SCx2  Department Number: [id number]

## 2021-05-24 NOTE — Progress Notes (Signed)
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 rib fx.s, effusion, intubated.  resp fail-vent, effusion, multiple rib fx's    TECHNIQUE: Frontal view of the chest.    COMPARISON:  05/07/2021    FINDINGS:  The support lines and tubes are in stable and satisfactory position. No pneumothorax.     There is increasing opacity bilaterally in the mid lung fields with infiltrate, effusion and vascular congestion. This could be inflammatory but the symmetry suggests development of pulmonary edema. Clinical correlation recommended.      Impression    Increasing opacity bilaterally suggesting increasing CHF. Clinical correlation recommended.      Radiologist location ID: [id number]     XR AP MOBILE CHEST     Status: None    Narrative    Stefen LEE Swisher    RADIOLOGIST: Colletta Maryland, MD    XR AP MOBILE CHEST performed on 05/10/2021 6:19 AM    CLINICAL HISTORY: respiratory failure.  resp fail-vent    TECHNIQUE: Frontal view of the chest.    COMPARISON:  05/09/2021    FINDINGS:  The support lines and tubes are in stable and satisfactory position.   Cardiac and mediastinal contours are stable.   Airspace disease is present in the mid to lower lungs with probable bilateral pleural effusions. Lung volumes are slightly improved since yesterday           Impression    BILATERAL AIRSPACE DISEASE AND PLEURAL EFFUSIONS WITH SOME IMPROVEMENT SINCE YESTERDAY      Radiologist  location ID: [id number]     CT ANGIO CHEST FOR PULMONARY EMBOLUS W IV CONTRAST     Status: None    Narrative    Ramsay LEE Boord    RADIOLOGIST: Colletta Maryland, MD    CT ANGIO CHEST FOR PULMONARY EMBOLUS W IV CONTRAST performed on 05/10/2021 11:49 AM    CLINICAL HISTORY: hypoxia.  on the Vent , increased hypoxia. CXR showed increased opacities  Increase in CHF  Lung sounds coarse  . fall on 05-01-21    TECHNIQUE: CTA imaging of the chest with intravenous contrast.  3D reconstructions.  CONTRAST:  100 ml's of Isovue 370    COMPARISON: 05/03/2021  # of known CTs in the past 12 months: 7   # of known Cardiac Nuclear Medicine Studies in the past 12 months: 0         FINDINGS:  Hardware:  There is an NG tube in the stomach. An ET tube is present in the trachea. There is a left-sided PICC line in the SVC.    Lymph nodes:   No mediastinal, hilar, or axillary lymphadenopathy.    Heart:  Coronary artery calcifications are noted.        RV/LV Diameter  Ratio: N/A    Thoracic Aorta:  No thoracic aortic aneurysm or dissection.    Pulmonary Vessels:  No evidence of acute pulmonary emboli through the major subsegmental branches.    Lungs and Airways:  There is patient respiratory motion artifact which limits evaluation of the lungs.      Bilateral lower lobe airspace disease with air bronchograms. Pneumonia versus atelectasis.      Pleura: Bilateral pleural effusions left larger than right    Upper Abdomen: Cirrhosis with splenomegaly. Small volume of ascites. Probable cholelithiasis.    Bones: Bone windows are unremarkable.        Impression    1.NO PULMONARY EMBOLUS  2.BILATERAL PLEURAL EFFUSIONS  3.BILATERAL LOWER LOBE AIRSPACE DISEASE. PNEUMONIA VERSUS ATELECTASIS      One or more dose reduction techniques were used (e.g., Automated exposure control, adjustment of the mA and/or kV according to patient size, use of iterative reconstruction technique).      Radiologist location ID: [id number]     XR AP MOBILE CHEST     Status: None     Narrative    Rashidi LEE Stash    RADIOLOGIST: Colletta Maryland, MD    XR AP MOBILE CHEST performed on 05/11/2021 5:35 AM    CLINICAL HISTORY: respiratory failure.  resp fail-vent    TECHNIQUE: Frontal view of the chest.    COMPARISON:  Yesterday    FINDINGS:  The support lines and tubes are in stable and satisfactory position.   Cardiac and mediastinal contours are stable.   Bilateral airspace disease with bilateral pleural effusions similar to yesterday           Impression    NO ACUTE FINDINGS.      Radiologist location ID: [id number]     CT BRAIN WO IV CONTRAST     Status: None    Narrative    Deuntae LEE Hench    RADIOLOGIST: Colletta Maryland, MD    CT BRAIN WO IV CONTRAST performed on 05/12/2021 2:27 AM    CLINICAL HISTORY: altered mental status.  Pt intubated and per RN very restless this early AM    TECHNIQUE:  Head CT without intravenous contrast.    COMPARISON: 05/01/2021  # of known CTs in the past 12 months: 8   # of known Cardiac Nuclear Medicine Studies in the past 12 months: 0    FINDINGS: This study is degraded by patient motion artifact  There is no acute intracranial hemorrhage, mass effect, or evidence of large acute infarct.    Brain: Low density in the periventricular white matter suggests mild chronic small vessel ischemic changes.    CSF Spaces: Mild generalized cerebral atrophy     Sinuses/Mastoids:  Clear at visualized levels     Bones: Unremarkable        Impression    CHRONIC CHANGES.  NO ACUTE FINDINGS.       One or more dose reduction techniques were used (e.g., Automated exposure control, adjustment of the mA and/or kV according to patient size, use of iterative reconstruction technique).      Radiologist location ID: [id number]     XR AP MOBILE CHEST     Status: None    Narrative    Takota LEE Klosinski    RADIOLOGIST: Colletta Maryland, MD    XR AP MOBILE CHEST performed on 05/12/2021 6:20 AM    CLINICAL HISTORY: respiratory failure.  vent management     TECHNIQUE: Frontal view of the  chest.    COMPARISON:  Yesterday    FINDINGS:  The support lines and tubes are in stable and satisfactory position.   Cardiac and mediastinal contours are stable.   There is a left pleural effusion and bilateral airspace disease with possible right pleural effusion. Mild improvement in the right lung since yesterday.           Impression    BILATERAL AIRSPACE DISEASE AND PLEURAL EFFUSIONS LEFT WORSE      Radiologist location ID: [id number]     XR AP MOBILE CHEST     Status: None    Narrative    Ko LEE Lapinsky    RADIOLOGIST: Colletta Maryland, MD    XR AP MOBILE CHEST performed on 05/13/2021 8:23 AM    CLINICAL HISTORY: respiratory failure, PNA.  resp.failure, PNA, vent    TECHNIQUE: Frontal view of the chest.    COMPARISON:  Yesterday    FINDINGS:  The support lines and tubes are in stable and satisfactory position.   Cardiac and mediastinal contours are stable.   There is a left pleural effusion unchanged from yesterday. The right lung is improved since yesterday with improved lung volumes.           Impression    PERSISTENT LEFT PLEURAL EFFUSION      Radiologist location ID: [id number]     XR AP MOBILE CHEST     Status: None    Narrative    Coulton LEE Rampersad    RADIOLOGIST: Berton Bon, MD    XR AP MOBILE CHEST performed on 05/14/2021 6:07 AM    CLINICAL HISTORY: Pleural effusions.  vent management. pleural effusion    TECHNIQUE: Frontal view of the chest.    COMPARISON:  Yesterday    FINDINGS:  ET tube 4.8 cm above the carina.. There is an NG tube extending at least into the distal esophagus and obscured over the upper abdomen. There is a left PICC catheter in the SVC   Heart size is moderately enlarged.     There are perihilar infiltrates with possible effusions obscuring the diaphragms especially on the left           Impression    Stable infiltrates/vascular congestion with probable effusions      Radiologist location ID: [id number]     XR AP MOBILE CHEST     Status: None    Narrative    Reggie LEE  Turbyfill    RADIOLOGIST: Brayton El, MD    XR AP MOBILE CHEST performed on 05/15/2021 6:04 AM    CLINICAL HISTORY: Pleural effusions.      vent management     TECHNIQUE: Frontal view of the chest.    COMPARISON:  05/13/2021    FINDINGS:  A left PICC ends in the upper SVC. The endotracheal tube ends 4 cm above the carina. The enteric tube ends below the diaphragm.     The heart size is normal.     There is improving aeration of both lungs most notably at both lung bases. A trace left pleural effusion persists.         Impression    Improving aeration of both lungs. The left pleural effusion appears to have decreased in size.       Radiologist location ID: [id number]     XR ABD SUPINE     Status: None    Narrative    Landyn LEE Glance    RADIOLOGIST: Brayton El, MD  XR ABD SUPINE performed on 05/15/2021 1:02 PM.    CLINICAL HISTORY: r/o ileus - abdominal distention.      ileus    TECHNIQUE: Single view abdomen.    COMPARISON:  None.      FINDINGS:  There is a diffuse gaseous distention of the small bowel. There is also gas within the cecum suggesting this could be an ileus.     No suspicious calcifications.    The bones are unremarkable.        Impression    Diffuse gaseous distention in a pattern suggestive of ileus. The enteric tube ends in the stomach.        Radiologist location ID: [id number]     MRI BRAIN W/WO CONTRAST     Status: None    Narrative    Horace LEE Dansby    RADIOLOGIST: Betsey Amen, MD    MRI BRAIN W/WO CONTRAST performed on 05/15/2021 10:34 PM    CLINICAL HISTORY: inconsistant neuro exam.  Trauma, splenic laceration.  Unable to wake pt calmy since sedated.    TECHNIQUE: Routine brain MRI without and with intravenous contrast.   INTRAVENOUS CONTRAST: 20 ml's of Dotarem    COMPARISON:  None.    FINDINGS:  Brain: Normal signal intensities.    Diffusion weighted images show no evidence of acute or recent infarct.   Postcontrast images show no suspicious  enhancement.    Ventricles: Normal.    Major Intracranial Vessels: Normal flow voids.    Sinuses: Clear.  Mastoids: Small amount of fluid signal is identified within both mastoid air cells.          Impression    No acute finding.        Radiologist location ID: [id number]     XR AP MOBILE CHEST     Status: None    Narrative    Jervon LEE Rossini    RADIOLOGIST: Berton Bon, MD    XR AP MOBILE CHEST performed on 05/16/2021 6:55 AM    CLINICAL HISTORY: Pleural effusions.  RESP FAIL-VENT    TECHNIQUE: Frontal view of the chest.    COMPARISON:  Yesterday    FINDINGS:  ET tube 4.7 cm above the carina. There is an NG tube extending towards the stomach. The tip is not included on this film. There is a left PICC catheter in the SVC   Heart size is moderately enlarged.     There are bilateral perihilar and infrahilar infiltrates with obscuration of the left diaphragm           Impression    Slightly increased infiltrates/vascular congestion      Radiologist location ID: [id number]     XR KUB     Status: None    Narrative    Braxtyn LEE Marcon    RADIOLOGIST: Berton Bon, MD    XR KUB performed on 05/16/2021 9:31 AM.    CLINICAL HISTORY: ileus.  evaluate ileus - abdominal distention    TECHNIQUE: Single view abdomen.    COMPARISON:  Yesterday    FINDINGS:  There is gaseous gastric distention. There is some mild diffuse gaseous small bowel distention. Some air is also suggested in the right colon    No suspicious calcifications.    There is lumbar spurring  There is an NG tube with the tip of the proximal stomach         Impression    1.Persistent gaseous intestinal distention suggesting an ileus. There is increased gastric distention  since yesterday        Radiologist location ID: [id number]     [place] CHEST (EFFUSION)     Status: None    Narrative    Kermitt LEE Castrellon    RADIOLOGIST: Brian Schambach    [place] CHEST (EFFUSION) performed on 05/16/2021 2:28 PM    CLINICAL HISTORY: evaluate for thoracentesis.  bilateral chest     COMPARISON:  None.    FINDINGS:  Ultrasound was used to evaluate for pleural effusions.    There is a small amount of perihepatic ascites. There is a trace right effusion. No left pleural fluid is seen.       Impression    As above.        Radiologist location ID: [id number]     XR AP MOBILE CHEST     Status: None    Narrative    Andras LEE Dulworth    RADIOLOGIST: Danne Baxter, MD    XR AP MOBILE CHEST performed on 05/17/2021 6:07 AM    CLINICAL HISTORY: respiratory failure.    chest trauma.   fractured ribs.  RESP FAIL-VENT    TECHNIQUE: Frontal view of the chest.    COMPARISON:  Yesterday    FINDINGS:  The support lines and tubes are in stable and satisfactory position.   The heart size is normal.   There is some stable mild opacity at both lung bases.   There is a stable small left pleural effusion.        Impression    NO SIGNIFICANT CHANGE SINCE THE PRIOR EXAM.      Radiologist location ID: [id number]     XR KUB     Status: None    Narrative    Darrell LEE Rivest    RADIOLOGIST: Danne Baxter, MD    XR KUB performed on 05/17/2021 8:25 AM.    CLINICAL HISTORY: ileus.  evaluate ileus    TECHNIQUE: Single view abdomen.    COMPARISON:  05/16/2021    FINDINGS:  There are markedly distended loops of small bowel with a paucity of air in colon. Findings indicate a small bowel obstruction. Gaseous distention of the stomach has decreased from the prior exam.    A nasogastric tube is present in the region of the stomach.    There are degenerative changes of the spine.        Impression    PERSISTENT MARKEDLY DISTENDED LOOPS OF SMALL BOWEL SUGGESTING AN OBSTRUCTION.        Radiologist location ID: [id number]     XR AP MOBILE CHEST     Status: None    Narrative    Dezmin LEE Donofrio    RADIOLOGIST: Ileene Hutchinson    XR AP MOBILE CHEST performed on 05/18/2021 6:50 AM    CLINICAL HISTORY: Chest pain/SOB, Left pleural effusion.  resp fail-vent    TECHNIQUE: Frontal view of the chest.    COMPARISON:  Yesterday    FINDINGS:  The support lines and  tubes are in stable and satisfactory position.   The heart size is normal.   Bibasilar opacities and a small left pleural effusion are unchanged.   The bones are unremarkable.        Impression    NO SIGNIFICANT CHANGE SINCE THE PRIOR EXAM.      Radiologist location ID: [id number]     XR KUB     Status: None    Narrative    Ganon LEE Pflug    RADIOLOGIST:  Brayton El, MD    XR KUB performed on 05/18/2021 10:21 AM.    CLINICAL HISTORY: f/u ileus.      ileus/vent    TECHNIQUE: Single view abdomen.    COMPARISON:  05/17/2021      FINDINGS:  There is diffuse gaseous distention of the small bowel and colon potentially representing ileus. An enteric tube is seen in the stomach.    There is a Foley catheter.    No suspicious calcifications.    Bibasilar atelectasis is present. Mild cardiomegaly.           Impression    Diffuse gaseous distention of the bowel suggestive of an ileus.        Radiologist location ID: [id number]     XR KUB     Status: None    Narrative    Tyger LEE Mandrell    RADIOLOGIST: Danne Baxter, MD    XR KUB performed on 05/19/2021 6:19 AM.    CLINICAL HISTORY: f/u ileus.  re evaluate ileus     TECHNIQUE: Single view abdomen.    COMPARISON:  Yesterday    FINDINGS:  There is a nasogastric tube projecting in the region of the distal body of the stomach.    There are distended gas-filled loops of small bowel with a similar degree of distention. Obstruction is suspected. Ileus is in differential.    There are degenerative changes of the spine.        Impression    NO SIGNIFICANT CHANGE FROM THE PRIOR STUDY.        Radiologist location ID: [id number]     XR AP MOBILE CHEST     Status: None    Narrative    Alexsandro LEE Santibanez    RADIOLOGIST: Danne Baxter, MD    XR AP MOBILE CHEST performed on 05/19/2021 6:20 AM    CLINICAL HISTORY: Chest pain/SOB, Left pleural effusion.  on vent     TECHNIQUE: Frontal view of the chest.    COMPARISON:  Yesterday    FINDINGS:  The support lines and tubes are in stable  and satisfactory position.   The heart size is normal.   There is patchy by airspace disease with a layering left pleural effusion. Aeration is similar.           Impression    NO SIGNIFICANT CHANGE SINCE THE PRIOR EXAM.      Radiologist location ID: [id number]     XR AP MOBILE CHEST     Status: None    Narrative    Lavert LEE Roberg    RADIOLOGIST: Kelby Frame    XR AP MOBILE CHEST performed on 05/20/2021 6:09 AM    CLINICAL HISTORY: Chest pain/SOB, Left pleural effusion.  re eval effusions     TECHNIQUE: Frontal view of the chest.    COMPARISON:  Chest x-ray from one day ago    FINDINGS:  Endotracheal tube is no longer seen. NG tube tip is below the diaphragm in the stomach. PICC line tip at the distal SVC is stable   Cardiac and mediastinal contours are stable.   Patchy infiltrative bilateral changes similar to the prior exam given difference in technique. Hazy appearance left lower chest likely indicating effusion.  Left rib fractures are seen. No obvious pneumothorax           Impression    NO SIGNIFICANT CHANGE SINCE THE PRIOR EXAM. (Extubated)      Radiologist location ID: [id number]  XR AP MOBILE CHEST     Status: None    Narrative    London LEE Whilden    RADIOLOGIST: Dorisann Frames, MD    XR AP MOBILE CHEST performed on 05/21/2021 12:38 PM    CLINICAL HISTORY: dobhoff tube placement.  dobhoff tube placement    TECHNIQUE: Frontal view of the chest.    COMPARISON:  Yesterday    FINDINGS:  Large body habitus degrades image quality.  The Dobbhoff tube can be seen to just below the level of the GE junction. A high KUB may be helpful for further clarifying the position of the tip.  Left PICC is noted.   Cardiac and mediastinal contours are stable.   No significant change in the appearance of the lungs.   Patchy and confluent bilateral pulmonary infiltrates persist with dense consolidation/atelectasis at the left lung base.        Impression    DOBBHOFF TUBE IS DIFFICULT TO SEE THE LOWER MARGIN OF THE IMAGE. A  HIGH KUB IMAGE MAY BE HELPFUL FOR FURTHER EVALUATION.      Radiologist location ID: [id number]     XR KUB     Status: None    Narrative    Izea LEE Leavitt    RADIOLOGIST: Otho Najjar, MD    XR KUB performed on 05/21/2021 2:11 PM.    CLINICAL HISTORY: dobhoff placement.  for dobhoff tube placement.    TECHNIQUE: Single view abdomen.    COMPARISON:  None.    FINDINGS:  NG tube placement demonstrates Dobbhoff tube to be overlying the stomach appearing to be anatomically positioned.    Gas pattern is nonspecific with a single loop of what appears to be small bowel in the left mid abdomen noted.  No gross calcifications.      Impression    Anatomic positioning of NG tube.      Radiologist location ID: [id number]     FLUORO ESOPHAGRAM, MODIFIED SWALLOW     Status: None    Narrative    Sheppard LEE Mcmurtrey    RADIOLOGIST: Otho Najjar, MD    FLUORO ESOPHAGRAM, MODIFIED SWALLOW performed on 05/22/2021 1:57 PM    CLINICAL HISTORY: dysphagia, cough post prolonged intubation. . .  dysphagia w/cough    TECHNIQUE:  Modified barium swallow was performed in conjunction with a member of the speech pathology department using various consistencies of barium and lateral fluoroscopic observation.     FLUOROSCOPIC TIME:  1.1 minutes  FLUOROGRAPHIC IMAGES: 13    FINDINGS:   The patient was given thin barium, nectar thick barium, applesauce, fruit and wafers mixed with barium.       Impression    Impression:  Thin barium:  Spoon: No penetration or aspiration.  Cup: No penetration or aspiration.  Straw: Penetration.    Nectar thick barium:  Spoon: Penetration area  Cup: No penetration or aspiration.  Straw: No penetration or aspiration.    Applesauce: No aspiration or penetration.  Fruit: After liquid barium squeezed out from the fruit, slight penetration.  Wafers mixed with barium: After liquid barium squeezed out, slight penetration.    PLEASE REFER TO SPEECH PATHOLOGIST'S REPORT FOR ADDITIONAL DETAILS.      Radiologist location  ID: [id number]        All current radiology studies were reviewed.    PHYSICAL EXAMINATION  Temperature: 36.8 C (98.3 F)  Heart Rate: 80 (transfer from ICU)  BP (Non-Invasive): 138/67  Respiratory Rate: 16  SpO2: 95 %  General:  No obvious discomfort.  No signs of respiratory distress.  05/24/2021- Patient is seen and examined , feeling much better, not in restraints, overall improved, lab work is stable, transferred to the step down on hospitalist service now ,    HEENT:  Normocephalic.  Orogastric tube in place.  Sclera are nonicteric.    Neck:  No obvious JVD.    Heart:  S1, S2.  No murmurs    Lungs:  Breath sounds are clear bilaterally;  slightly decreased at the bases left greater than right.    Abdomen:  Abdomen is still distended but not as firm as it was.  There are some bowel sounds present.  He still receiving the lactulose enemas.    Extremities:  No significant edema.  No cyanosis or clubbing    Neuro:  Nonfocal.  Responding to questions appropriately.      Skin - warm, no jaundice     Orogasttric Tube:  OROGASTRIC     Tube Feedings:  ON HOLD.     Psych- normal base line mood ,     PICC LINE:  LEFT ARM    INTRAVENOUS INFUSIONS: 1.  PRECEDEX AND KETAMINE DISCONTINUED.    FOLEY CATHETER:  IN PLACE.   LENGTH OF STAY INPUT AND OUTPUT SHOW A NET NEGATIVE                                                            OF  - 9.8 L.    PROBLEM LIST:   Active Hospital Problems   (*Primary Problem)    Diagnosis   . *Spleen injury   . Status post thoracentesis   . Ileus (CMS HCC)   . Leukopenia   . Encephalopathy   . Serum ammonia increased (CMS [place])   . Pleural effusion   . On mechanically assisted ventilation (CMS HCC)   . Multiple rib fractures   . Left pulmonary contusion   . Closed fracture of transverse process of thoracic vertebra (CMS HCC)   . 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 Thrombocytopenia (CMS HCC)   . Alcohol abuse     -  EXTUBATED ON 05/19/2021    PLAN:  1.  Continue supplemental O2 as  needed. IS and pulmonary tolieting  2.  Speech evaluated   3.  Continue lactulose enemas.  4.  GI prophylaxis and DVT prophylaxis.  5.  Chest x-ray, CBC and chemistry in the morning.  6.  Continue bowel regimen.  7.  Continue IV fluid.  8.  Continue bowel regimen and also continue Reglan.  9.  PT/OT    Natalia Zavodchikov, DO

## 2021-05-24 NOTE — Progress Notes (Addendum)
Name: Dragon Thrush MRN:  [id number]   Date of admission: 05/01/2021 Age: 60 y.o.       Name - Hector [name]    Date of service - 05/24/2021      Interval history  No acute events overnight.  The patient has been posted to the step-down unit but remains in the ICU for now until a bed is available.  Tolerating a dysphagia diet.  Patient answers questions but is confused at times.  Afebrile.  He denies any pain.  Foley catheter in place.    Physical exam  Filed Vitals:    05/24/21 0700 05/24/21 0800 05/24/21 0807 05/24/21 1224   BP: (!) 158/75 138/67     Pulse: 72 72     Resp: 15 16     Temp:   36.7 C (98.1 F) 36.8 C (98.3 F)   SpO2: 95% 95%            Intake/Output Summary (Last 24 hours) at 05/24/2021 1233  Last data filed at 05/24/2021 0600  Gross per 24 hour   Intake 460 ml   Output 2620 ml   Net -2160 ml        General:Follows commands appropriately  Abdomen: soft, nontender, small umbilical hernia,no significant distention  Skin: No jaundice, lesions, or rashes.  Lungs:Respirations unlabored  CV: Hemodynamically stable  Extremities:no significant edema      Labs/Imaging -   Reviewed      ASSESSMENT & RECOMMENDATIONS:   60y.o.malestatus post fall downstairs sustaining multiple left-sided rib fractures with subcutaneous emphysema,transverse process fracture of thoracic spine,and grade 3 splenic laceration  - continue dysphagia diet  -Lovenox for DVT prophylaxis  -appreciate the assistance of theancillary services of PT OTand speech  -encourage incentive spirometer use and aggressive pulmonary toilet  -when patient is transferred to the floor the hospitalist service can take over as primary  - patient's Foley catheter can be removed when he is more ambulatory  -disposition planning    Clinton Quant, MD      Note: A portion of this documentation was generated using MMODAL (voice recognition software) and may contain syntax/voice recognition errors.

## 2021-05-25 DIAGNOSIS — S36031D Moderate laceration of spleen, subsequent encounter: Secondary | ICD-10-CM

## 2021-05-25 DIAGNOSIS — Z6830 Body mass index (BMI) 30.0-30.9, adult: Secondary | ICD-10-CM

## 2021-05-25 DIAGNOSIS — S2242XD Multiple fractures of ribs, left side, subsequent encounter for fracture with routine healing: Secondary | ICD-10-CM

## 2021-05-25 DIAGNOSIS — M2578 Osteophyte, vertebrae: Secondary | ICD-10-CM

## 2021-05-25 MED ORDER — MELATONIN 3 MG TABLET
6.0000 mg | ORAL_TABLET | Freq: Every evening | ORAL | Status: DC
Start: 2021-05-25 — End: 2021-05-30
  Administered 2021-05-25 – 2021-05-28 (×4): 6 mg via ORAL
  Filled 2021-05-25 (×4): qty 2

## 2021-05-25 MED ORDER — QUETIAPINE 25 MG TABLET
25.0000 mg | ORAL_TABLET | Freq: Every evening | ORAL | Status: DC
Start: 2021-05-25 — End: 2021-05-30
  Administered 2021-05-25 – 2021-05-28 (×4): 25 mg via ORAL
  Filled 2021-05-25 (×6): qty 1

## 2021-05-25 NOTE — Progress Notes (Signed)
[facility] is still following patient's progress and want to meet with the pt as soon as possible. Provider noted that pt is "pleasantly confused," but wrote this is his baseline since extubation. We will now wait to see if pt recovers to where he can understand what is going on, fully. This way we can come up with a plan for treatment. Follow-up is scheduled for 05/29/2021

## 2021-05-25 NOTE — Progress Notes (Signed)
Name: Hector [name] MRN:  [id number]   Date of admission: 05/01/2021 Age: 60 y.o.       Name - Hector [name]    Date of service - 05/25/2021      Interval history  Patient was finally transferred to the step-down unit as there was a bed available.  He is pleasantly confused this morning however this is his baseline since extubation.  He denies any pain.  He denies any shortness of breath.  He has been tolerating his dysphagia diet.  Afebrile.    Physical exam  Filed Vitals:    05/25/21 0149 05/25/21 0525 05/25/21 0547 05/25/21 0548   BP:   136/70    Pulse: 71 67 76    Resp:       Temp:    37 C (98.6 F)   SpO2:    91%          Intake/Output Summary (Last 24 hours) at 05/25/2021 0750  Last data filed at 05/24/2021 2200  Gross per 24 hour   Intake 355 ml   Output 1350 ml   Net -995 ml        Abdomen: soft, nontender, small umbilical hernia,no significant distention  Skin: No jaundice, lesions, or rashes.  Lungs:Respirations unlabored  CV: Hemodynamically stable  Extremities:no significant edema      Labs/Imaging -   Reviewed    ASSESSMENT & RECOMMENDATIONS:   60y.o.malestatus post fall downstairs sustaining multiple left-sided rib fractures with subcutaneous emphysema,transverse process fracture of thoracic spine,and grade 3 splenic laceration  - continue dysphagia diet  -Lovenox for DVT prophylaxis  -appreciate the assistance of theancillary services of PT OTand speech  - patient's Foley catheter can be removed when he is more ambulatory  - disposition planning  - trauma Service will sign off at this time but please call us back with any questions or concerns    Clinton Quant, MD      Note: A portion of this documentation was generated using MMODAL (voice recognition software) and may contain syntax/voice recognition errors.

## 2021-05-25 NOTE — Progress Notes (Signed)
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    CLINICAL HISTORY: respiratory failure.  resp fail-vent    TECHNIQUE: Frontal view of the chest.    COMPARISON:  05/09/2021    FINDINGS:  The support lines and tubes are in stable and satisfactory position.   Cardiac and mediastinal contours are stable.   Airspace disease is present in the mid to lower lungs with probable bilateral pleural effusions. Lung volumes are slightly improved since yesterday           Impression    BILATERAL AIRSPACE DISEASE AND PLEURAL EFFUSIONS WITH SOME IMPROVEMENT SINCE YESTERDAY      Radiologist location ID: [id number]     CT ANGIO CHEST FOR PULMONARY EMBOLUS W IV CONTRAST     Status: None    Narrative    Calob LEE Henne    RADIOLOGIST: Colletta Maryland, MD    CT ANGIO CHEST FOR PULMONARY EMBOLUS W IV CONTRAST performed on 05/10/2021 11:49 AM    CLINICAL HISTORY: hypoxia.  on the Vent , increased hypoxia. CXR showed increased opacities  Increase in CHF  Lung sounds coarse  . fall on 05-01-21    TECHNIQUE: CTA imaging of the chest with intravenous contrast.  3D reconstructions.  CONTRAST:  100 ml's of Isovue 370    COMPARISON: 05/03/2021  # of known CTs in the past 12 months: 7   # of known Cardiac Nuclear Medicine Studies in the past 12 months: 0         FINDINGS:  Hardware:  There is an NG tube in the stomach. An ET tube is present in the trachea. There is a left-sided PICC line in the SVC.    Lymph nodes:   No mediastinal,  hilar, or axillary lymphadenopathy.    Heart:  Coronary artery calcifications are noted.        RV/LV Diameter Ratio: N/A    Thoracic Aorta:  No thoracic aortic aneurysm or dissection.    Pulmonary Vessels:  No evidence of acute pulmonary emboli through the major subsegmental branches.    Lungs and Airways:  There is patient respiratory motion artifact which limits evaluation of the lungs.      Bilateral lower lobe airspace disease with air bronchograms. Pneumonia versus atelectasis.      Pleura: Bilateral pleural effusions left larger than right    Upper Abdomen: Cirrhosis with splenomegaly. Small volume of ascites. Probable cholelithiasis.    Bones: Bone windows are unremarkable.        Impression    1.NO PULMONARY EMBOLUS  2.BILATERAL PLEURAL EFFUSIONS  3.BILATERAL LOWER LOBE AIRSPACE DISEASE. PNEUMONIA VERSUS ATELECTASIS      One or more dose reduction techniques were used (e.g., Automated exposure control, adjustment of the mA and/or kV according to patient size, use of iterative reconstruction technique).      Radiologist location ID: [id number]     XR AP MOBILE CHEST     Status: None    Narrative    Lottie LEE Takagi    RADIOLOGIST: Colletta Maryland, MD    XR AP MOBILE CHEST performed on 05/11/2021 5:35 AM    CLINICAL HISTORY: respiratory failure.  resp fail-vent    TECHNIQUE: Frontal view of the chest.    COMPARISON:  Yesterday    FINDINGS:  The support  lines and tubes are in stable and satisfactory position.   Cardiac and mediastinal contours are stable.   Bilateral airspace disease with bilateral pleural effusions similar to yesterday           Impression    NO ACUTE FINDINGS.      Radiologist location ID: [id number]     CT BRAIN WO IV CONTRAST     Status: None    Narrative    Maveryck LEE Bateson    RADIOLOGIST: Colletta Maryland, MD    CT BRAIN WO IV CONTRAST performed on 05/12/2021 2:27 AM    CLINICAL HISTORY: altered mental status.  Pt intubated and per RN very restless this early AM    TECHNIQUE:  Head CT without  intravenous contrast.    COMPARISON: 05/01/2021  # of known CTs in the past 12 months: 8   # of known Cardiac Nuclear Medicine Studies in the past 12 months: 0    FINDINGS: This study is degraded by patient motion artifact  There is no acute intracranial hemorrhage, mass effect, or evidence of large acute infarct.    Brain: Low density in the periventricular white matter suggests mild chronic small vessel ischemic changes.    CSF Spaces: Mild generalized cerebral atrophy     Sinuses/Mastoids:  Clear at visualized levels     Bones: Unremarkable        Impression    CHRONIC CHANGES.  NO ACUTE FINDINGS.       One or more dose reduction techniques were used (e.g., Automated exposure control, adjustment of the mA and/or kV according to patient size, use of iterative reconstruction technique).      Radiologist location ID: [id number]     XR AP MOBILE CHEST     Status: None    Narrative    Othel LEE Accardi    RADIOLOGIST: Colletta Maryland, MD    XR AP MOBILE CHEST performed on 05/12/2021 6:20 AM    CLINICAL HISTORY: respiratory failure.  vent management     TECHNIQUE: Frontal view of the chest.    COMPARISON:  Yesterday    FINDINGS:  The support lines and tubes are in stable and satisfactory position.   Cardiac and mediastinal contours are stable.   There is a left pleural effusion and bilateral airspace disease with possible right pleural effusion. Mild improvement in the right lung since yesterday.           Impression    BILATERAL AIRSPACE DISEASE AND PLEURAL EFFUSIONS LEFT WORSE      Radiologist location ID: [id number]     XR AP MOBILE CHEST     Status: None    Narrative    Jahzion LEE Stang    RADIOLOGIST: Colletta Maryland, MD    XR AP MOBILE CHEST performed on 05/13/2021 8:23 AM    CLINICAL HISTORY: respiratory failure, PNA.  resp.failure, PNA, vent    TECHNIQUE: Frontal view of the chest.    COMPARISON:  Yesterday    FINDINGS:  The support lines and tubes are in stable and satisfactory position.   Cardiac and mediastinal  contours are stable.   There is a left pleural effusion unchanged from yesterday. The right lung is improved since yesterday with improved lung volumes.           Impression    PERSISTENT LEFT PLEURAL EFFUSION      Radiologist location ID: [id number]     XR AP MOBILE CHEST     Status: None  Narrative    Ayiden LEE Goings    RADIOLOGIST: Berton Bon, MD    XR AP MOBILE CHEST performed on 05/14/2021 6:07 AM    CLINICAL HISTORY: Pleural effusions.  vent management. pleural effusion    TECHNIQUE: Frontal view of the chest.    COMPARISON:  Yesterday    FINDINGS:  ET tube 4.8 cm above the carina.. There is an NG tube extending at least into the distal esophagus and obscured over the upper abdomen. There is a left PICC catheter in the SVC   Heart size is moderately enlarged.     There are perihilar infiltrates with possible effusions obscuring the diaphragms especially on the left           Impression    Stable infiltrates/vascular congestion with probable effusions      Radiologist location ID: [id number]     XR AP MOBILE CHEST     Status: None    Narrative    Shourya LEE Knezevic    RADIOLOGIST: Brayton El, MD    XR AP MOBILE CHEST performed on 05/15/2021 6:04 AM    CLINICAL HISTORY: Pleural effusions.      vent management     TECHNIQUE: Frontal view of the chest.    COMPARISON:  05/13/2021    FINDINGS:  A left PICC ends in the upper SVC. The endotracheal tube ends 4 cm above the carina. The enteric tube ends below the diaphragm.     The heart size is normal.     There is improving aeration of both lungs most notably at both lung bases. A trace left pleural effusion persists.         Impression    Improving aeration of both lungs. The left pleural effusion appears to have decreased in size.       Radiologist location ID: [id number]     XR ABD SUPINE     Status: None    Narrative    Gerson LEE Steinhoff    RADIOLOGIST: Brayton El, MD    XR ABD SUPINE performed on 05/15/2021 1:02 PM.    CLINICAL HISTORY: r/o  ileus - abdominal distention.      ileus    TECHNIQUE: Single view abdomen.    COMPARISON:  None.      FINDINGS:  There is a diffuse gaseous distention of the small bowel. There is also gas within the cecum suggesting this could be an ileus.     No suspicious calcifications.    The bones are unremarkable.        Impression    Diffuse gaseous distention in a pattern suggestive of ileus. The enteric tube ends in the stomach.        Radiologist location ID: [id number]     MRI BRAIN W/WO CONTRAST     Status: None    Narrative    Kharee LEE Bruns    RADIOLOGIST: Betsey Amen, MD    MRI BRAIN W/WO CONTRAST performed on 05/15/2021 10:34 PM    CLINICAL HISTORY: inconsistant neuro exam.  Trauma, splenic laceration.  Unable to wake pt calmy since sedated.    TECHNIQUE: Routine brain MRI without and with intravenous contrast.   INTRAVENOUS CONTRAST: 20 ml's of Dotarem    COMPARISON:  None.    FINDINGS:  Brain: Normal signal intensities.    Diffusion weighted images show no evidence of acute or recent infarct.   Postcontrast images show no suspicious enhancement.    Ventricles: Normal.  Major Intracranial Vessels: Normal flow voids.    Sinuses: Clear.  Mastoids: Small amount of fluid signal is identified within both mastoid air cells.          Impression    No acute finding.        Radiologist location ID: [id number]     XR AP MOBILE CHEST     Status: None    Narrative    Nina LEE Herrada    RADIOLOGIST: Berton Bon, MD    XR AP MOBILE CHEST performed on 05/16/2021 6:55 AM    CLINICAL HISTORY: Pleural effusions.  RESP FAIL-VENT    TECHNIQUE: Frontal view of the chest.    COMPARISON:  Yesterday    FINDINGS:  ET tube 4.7 cm above the carina. There is an NG tube extending towards the stomach. The tip is not included on this film. There is a left PICC catheter in the SVC   Heart size is moderately enlarged.     There are bilateral perihilar and infrahilar infiltrates with obscuration of the left diaphragm           Impression     Slightly increased infiltrates/vascular congestion      Radiologist location ID: [id number]     XR KUB     Status: None    Narrative    Rasool LEE Wiechman    RADIOLOGIST: Berton Bon, MD    XR KUB performed on 05/16/2021 9:31 AM.    CLINICAL HISTORY: ileus.  evaluate ileus - abdominal distention    TECHNIQUE: Single view abdomen.    COMPARISON:  Yesterday    FINDINGS:  There is gaseous gastric distention. There is some mild diffuse gaseous small bowel distention. Some air is also suggested in the right colon    No suspicious calcifications.    There is lumbar spurring  There is an NG tube with the tip of the proximal stomach         Impression    1.Persistent gaseous intestinal distention suggesting an ileus. There is increased gastric distention since yesterday        Radiologist location ID: [id number]     [place] CHEST (EFFUSION)     Status: None    Narrative    Starsky LEE Batts    RADIOLOGIST: Brian Schambach    [place] CHEST (EFFUSION) performed on 05/16/2021 2:28 PM    CLINICAL HISTORY: evaluate for thoracentesis.  bilateral chest     COMPARISON: None.    FINDINGS:  Ultrasound was used to evaluate for pleural effusions.    There is a small amount of perihepatic ascites. There is a trace right effusion. No left pleural fluid is seen.       Impression    As above.        Radiologist location ID: [id number]     XR AP MOBILE CHEST     Status: None    Narrative    Traevion LEE Cavness    RADIOLOGIST: Danne Baxter, MD    XR AP MOBILE CHEST performed on 05/17/2021 6:07 AM    CLINICAL HISTORY: respiratory failure.    chest trauma.   fractured ribs.  RESP FAIL-VENT    TECHNIQUE: Frontal view of the chest.    COMPARISON:  Yesterday    FINDINGS:  The support lines and tubes are in stable and satisfactory position.   The heart size is normal.   There is some stable mild opacity at both lung bases.   There is a stable  small left pleural effusion.        Impression    NO SIGNIFICANT CHANGE SINCE THE PRIOR EXAM.      Radiologist location ID:  [id number]     XR KUB     Status: None    Narrative    Kalub LEE Maura    RADIOLOGIST: Danne Baxter, MD    XR KUB performed on 05/17/2021 8:25 AM.    CLINICAL HISTORY: ileus.  evaluate ileus    TECHNIQUE: Single view abdomen.    COMPARISON:  05/16/2021    FINDINGS:  There are markedly distended loops of small bowel with a paucity of air in colon. Findings indicate a small bowel obstruction. Gaseous distention of the stomach has decreased from the prior exam.    A nasogastric tube is present in the region of the stomach.    There are degenerative changes of the spine.        Impression    PERSISTENT MARKEDLY DISTENDED LOOPS OF SMALL BOWEL SUGGESTING AN OBSTRUCTION.        Radiologist location ID: [id number]     XR AP MOBILE CHEST     Status: None    Narrative    Timoth LEE Finnigan    RADIOLOGIST: Ileene Hutchinson    XR AP MOBILE CHEST performed on 05/18/2021 6:50 AM    CLINICAL HISTORY: Chest pain/SOB, Left pleural effusion.  resp fail-vent    TECHNIQUE: Frontal view of the chest.    COMPARISON:  Yesterday    FINDINGS:  The support lines and tubes are in stable and satisfactory position.   The heart size is normal.   Bibasilar opacities and a small left pleural effusion are unchanged.   The bones are unremarkable.        Impression    NO SIGNIFICANT CHANGE SINCE THE PRIOR EXAM.      Radiologist location ID: [id number]     XR KUB     Status: None    Narrative    Khadeem LEE Rothman    RADIOLOGIST: Brayton El, MD    XR KUB performed on 05/18/2021 10:21 AM.    CLINICAL HISTORY: f/u ileus.      ileus/vent    TECHNIQUE: Single view abdomen.    COMPARISON:  05/17/2021      FINDINGS:  There is diffuse gaseous distention of the small bowel and colon potentially representing ileus. An enteric tube is seen in the stomach.    There is a Foley catheter.    No suspicious calcifications.    Bibasilar atelectasis is present. Mild cardiomegaly.           Impression    Diffuse gaseous distention of the bowel suggestive of an  ileus.        Radiologist location ID: [id number]     XR KUB     Status: None    Narrative    Jeremey LEE Deere    RADIOLOGIST: Danne Baxter, MD    XR KUB performed on 05/19/2021 6:19 AM.    CLINICAL HISTORY: f/u ileus.  re evaluate ileus     TECHNIQUE: Single view abdomen.    COMPARISON:  Yesterday    FINDINGS:  There is a nasogastric tube projecting in the region of the distal body of the stomach.    There are distended gas-filled loops of small bowel with a similar degree of distention. Obstruction is suspected. Ileus is in differential.    There are degenerative changes of the spine.  Impression    NO SIGNIFICANT CHANGE FROM THE PRIOR STUDY.        Radiologist location ID: [id number]     XR AP MOBILE CHEST     Status: None    Narrative    Parker LEE Clayborn    RADIOLOGIST: Danne Baxter, MD    XR AP MOBILE CHEST performed on 05/19/2021 6:20 AM    CLINICAL HISTORY: Chest pain/SOB, Left pleural effusion.  on vent     TECHNIQUE: Frontal view of the chest.    COMPARISON:  Yesterday    FINDINGS:  The support lines and tubes are in stable and satisfactory position.   The heart size is normal.   There is patchy by airspace disease with a layering left pleural effusion. Aeration is similar.           Impression    NO SIGNIFICANT CHANGE SINCE THE PRIOR EXAM.      Radiologist location ID: [id number]     XR AP MOBILE CHEST     Status: None    Narrative    Eyan LEE Pickron    RADIOLOGIST: Kelby Frame    XR AP MOBILE CHEST performed on 05/20/2021 6:09 AM    CLINICAL HISTORY: Chest pain/SOB, Left pleural effusion.  re eval effusions     TECHNIQUE: Frontal view of the chest.    COMPARISON:  Chest x-ray from one day ago    FINDINGS:  Endotracheal tube is no longer seen. NG tube tip is below the diaphragm in the stomach. PICC line tip at the distal SVC is stable   Cardiac and mediastinal contours are stable.   Patchy infiltrative bilateral changes similar to the prior exam given difference in technique. Hazy appearance left  lower chest likely indicating effusion.  Left rib fractures are seen. No obvious pneumothorax           Impression    NO SIGNIFICANT CHANGE SINCE THE PRIOR EXAM. (Extubated)      Radiologist location ID: [id number]     XR AP MOBILE CHEST     Status: None    Narrative    Rochelle LEE Metzgar    RADIOLOGIST: Dorisann Frames, MD    XR AP MOBILE CHEST performed on 05/21/2021 12:38 PM    CLINICAL HISTORY: dobhoff tube placement.  dobhoff tube placement    TECHNIQUE: Frontal view of the chest.    COMPARISON:  Yesterday    FINDINGS:  Large body habitus degrades image quality.  The Dobbhoff tube can be seen to just below the level of the GE junction. A high KUB may be helpful for further clarifying the position of the tip.  Left PICC is noted.   Cardiac and mediastinal contours are stable.   No significant change in the appearance of the lungs.   Patchy and confluent bilateral pulmonary infiltrates persist with dense consolidation/atelectasis at the left lung base.        Impression    DOBBHOFF TUBE IS DIFFICULT TO SEE THE LOWER MARGIN OF THE IMAGE. A HIGH KUB IMAGE MAY BE HELPFUL FOR FURTHER EVALUATION.      Radiologist location ID: [id number]     XR KUB     Status: None    Narrative    Pershing LEE Browder    RADIOLOGIST: Hector Najjar, MD    XR KUB performed on 05/21/2021 2:11 PM.    CLINICAL HISTORY: dobhoff placement.  for dobhoff tube placement.    TECHNIQUE: Single view abdomen.  COMPARISON:  None.    FINDINGS:  NG tube placement demonstrates Dobbhoff tube to be overlying the stomach appearing to be anatomically positioned.    Gas pattern is nonspecific with a single loop of what appears to be small bowel in the left mid abdomen noted.  No gross calcifications.      Impression    Anatomic positioning of NG tube.      Radiologist location ID: [id number]     FLUORO ESOPHAGRAM, MODIFIED SWALLOW     Status: None    Narrative    Wake LEE Groll    RADIOLOGIST: Hector Najjar, MD    FLUORO ESOPHAGRAM, MODIFIED  SWALLOW performed on 05/22/2021 1:57 PM    CLINICAL HISTORY: dysphagia, cough post prolonged intubation. . .  dysphagia w/cough    TECHNIQUE:  Modified barium swallow was performed in conjunction with a member of the speech pathology department using various consistencies of barium and lateral fluoroscopic observation.     FLUOROSCOPIC TIME:  1.1 minutes  FLUOROGRAPHIC IMAGES: 13    FINDINGS:   The patient was given thin barium, nectar thick barium, applesauce, fruit and wafers mixed with barium.       Impression    Impression:  Thin barium:  Spoon: No penetration or aspiration.  Cup: No penetration or aspiration.  Straw: Penetration.    Nectar thick barium:  Spoon: Penetration area  Cup: No penetration or aspiration.  Straw: No penetration or aspiration.    Applesauce: No aspiration or penetration.  Fruit: After liquid barium squeezed out from the fruit, slight penetration.  Wafers mixed with barium: After liquid barium squeezed out, slight penetration.    PLEASE REFER TO SPEECH PATHOLOGIST'S REPORT FOR ADDITIONAL DETAILS.      Radiologist location ID: [id number]        All current radiology studies were reviewed.    PHYSICAL EXAMINATION  Temperature: 37 C (98.6 F)  Heart Rate: 80  BP (Non-Invasive): 132/71  Respiratory Rate: 16  SpO2: 91 %    General:  No obvious discomfort.  No signs of respiratory distress.  05/24/2021- Patient is seen and examined , feeling much better, not in restraints, overall improved, lab work is stable, transferred to the step down on hospitalist service now ,    6/11-Patient is seen and examined , complaining of insomnia and unable to sleep at all, with lingering symptoms during the night ,     HEENT:  Normocephalic.  Orogastric tube in place.  Sclera are nonicteric.    Neck:  No obvious JVD.    Heart:  S1, S2.  No murmurs    Lungs:  Breath sounds are clear bilaterally;  slightly decreased at the bases left greater than right.    Abdomen:  Abdomen is still distended but not as firm as  it was.  There are some bowel sounds present.  He still receiving the lactulose enemas.    Extremities:  No significant edema.  No cyanosis or clubbing    Neuro:  Nonfocal.  Responding to questions appropriately.      Skin - warm, no jaundice     Orogasttric Tube:  OROGASTRIC     Tube Feedings:  ON HOLD.     Psych- normal base line mood ,     PICC LINE:  LEFT ARM    INTRAVENOUS INFUSIONS: 1.  PRECEDEX AND KETAMINE DISCONTINUED.    FOLEY CATHETER:  IN PLACE.   LENGTH OF STAY INPUT AND OUTPUT SHOW A NET NEGATIVE  OF  - 9.8 L.    PROBLEM LIST:   Active Hospital Problems   (*Primary Problem)    Diagnosis   . *Spleen injury   . Status post thoracentesis   . Ileus (CMS HCC)   . Leukopenia   . Encephalopathy   . Serum ammonia increased (CMS [place])   . Pleural effusion   . On mechanically assisted ventilation (CMS HCC)   . Multiple rib fractures   . Left pulmonary contusion   . Closed fracture of transverse process of thoracic vertebra (CMS HCC)   . 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 Thrombocytopenia (CMS HCC)   . Alcohol abuse     -  EXTUBATED ON 05/19/2021    PLAN:  1.  Continue supplemental O2 as needed. IS and pulmonary tolieting  2.  Speech evaluated   3.  Continue lactulose enemas.  4.  GI prophylaxis and DVT prophylaxis.  5.  Chest x-ray, CBC and chemistry in the morning.  6.  Continue bowel regimen.  7.  Continue IV fluid.  8.  Continue bowel regimen and also continue Reglan.  9.  PT/OT    Natalia Zavodchikov, DO

## 2021-05-26 MED ORDER — SODIUM CHLORIDE 5 % EYE DROPS
1.0000 [drp] | OPHTHALMIC | Status: DC | PRN
Start: 2021-05-26 — End: 2021-05-30
  Filled 2021-05-26: qty 15

## 2021-05-26 NOTE — Care Plan (Signed)
Patient resting in bed with eyes closed.  Systems reviewed, VSS, plan of care reviewed and updated.  Will continue to assess and monitor.      Problem: Adult Inpatient Plan of Care  Goal: Plan of Care Review  Recent Flowsheet Documentation  Taken 05/26/2021 2118 by Zigmund Daniel, RN  Plan of Care Reviewed With: patient  Goal: Patient-Specific Goal (Individualized)  Recent Flowsheet Documentation  Taken 05/26/2021 2118 by Zigmund Daniel, RN  Individualized Care Needs: fall precautions  Anxieties, Fears or Concerns: wants to go home  Patient-Specific Goals (Include Timeframe): safety  Goal: Absence of Hospital-Acquired Illness or Injury  Intervention: Identify and Manage Fall Risk  Recent Flowsheet Documentation  Taken 05/26/2021 2118 by Zigmund Daniel, RN  Safety Promotion/Fall Prevention: activity supervised  Intervention: Prevent Skin Injury  Recent Flowsheet Documentation  Taken 05/26/2021 2118 by Zigmund Daniel, RN  Body Position: fowlers ( 45-60 degrees)  Intervention: Prevent and Manage VTE (Venous Thromboembolism) Risk  Recent Flowsheet Documentation  Taken 05/26/2021 2118 by Zigmund Daniel, RN  VTE Prevention/Management: dorsiflexion/plantar flexion performed  Taken 05/26/2021 2000 by Zigmund Daniel, RN  VTE Prevention/Management:   dorsiflexion/plantar flexion performed   sequential compression devices off   patient refused intervention  Intervention: Prevent Infection  Recent Flowsheet Documentation  Taken 05/26/2021 2118 by Zigmund Daniel, RN  Infection Prevention: barrier precautions utilized  Goal: Optimal Comfort and Wellbeing  Intervention: Provide Person-Centered Care  Recent Flowsheet Documentation  Taken 05/26/2021 2118 by Zigmund Daniel, RN  Trust Relationship/Rapport:   care explained   empathic listening provided   emotional support provided

## 2021-05-27 NOTE — Care Plan (Signed)
Pt had fall this AM from chair.   Problem: Fall Injury Risk  Goal: Absence of Fall and Fall-Related Injury  Outcome: Ongoing (see interventions/notes)     Problem: Health Knowledge, Opportunity to Enhance (Adult,Obstetrics,Pediatric)  Goal: Knowledgeable about Health Subject/Topic  Description: Patient will demonstrate the desired outcomes by discharge/transition of care.  Outcome: Ongoing (see interventions/notes)

## 2021-05-27 NOTE — Nurses Notes (Signed)
Date of fall: 05/27/2021  Time of fall:  0930    Description of fall event: Pt was attempting to go to the bathroom and got out of chair. Slid from chair to floor. Pt. Was placed in the chair earlier by PT and chair was not locked. Resident came to check patient. Vitals stable.     The patient's level of injury was NONE.    Was the patient's fall assisted by an employee (eased to floor, braced fall, etc)?  No    Immediate care provided to patient:  [name] RN     Physician notified (who and when):  yes     Orders received:  NoNo    Family notified: yes     Fall risk reassessment documented?  Yesyes     Was a FALL RISK ASSESSMENT completed prior to the fall?  Hector [name]     Was the patient as risk for falls?  Yesyes     What precautions were in place at the time of the fall?  Call bell within reach, Bed in low position, Chair check on, Elimination needs assessed every 2 hours, Patient reoriented every 2 hours, Fall risk identification armband on and Non-skid footwear onyes     Were restraints in use at the time of the fall?  Nono

## 2021-05-27 NOTE — Progress Notes (Signed)
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 05/10/2021 6:19 AM    CLINICAL HISTORY: respiratory failure.  resp fail-vent    TECHNIQUE: Frontal view of the chest.    COMPARISON:  05/09/2021    FINDINGS:  The support lines and tubes are in stable and satisfactory position.   Cardiac and mediastinal contours are stable.   Airspace disease is present in the mid to lower lungs with probable bilateral pleural effusions. Lung volumes are slightly improved since yesterday           Impression    BILATERAL AIRSPACE DISEASE AND PLEURAL EFFUSIONS WITH SOME IMPROVEMENT SINCE YESTERDAY      Radiologist location ID: [id number]     CT ANGIO CHEST FOR PULMONARY EMBOLUS W IV CONTRAST     Status: None    Narrative    Davelle LEE Bonzo    RADIOLOGIST: Colletta Maryland, MD    CT ANGIO CHEST FOR PULMONARY EMBOLUS W IV CONTRAST performed on 05/10/2021 11:49 AM    CLINICAL HISTORY: hypoxia.  on the Vent , increased hypoxia. CXR showed increased opacities  Increase in CHF  Lung sounds coarse  . fall on 05-01-21    TECHNIQUE: CTA imaging of the chest with intravenous contrast.  3D reconstructions.  CONTRAST:  100 ml's of Isovue 370    COMPARISON: 05/03/2021  # of known CTs in the past 12 months: 7   # of known Cardiac Nuclear Medicine Studies in the past 12 months: 0         FINDINGS:  Hardware:  There is an NG tube in the stomach. An ET tube  is present in the trachea. There is a left-sided PICC line in the SVC.    Lymph nodes:   No mediastinal, hilar, or axillary lymphadenopathy.    Heart:  Coronary artery calcifications are noted.        RV/LV Diameter Ratio: N/A    Thoracic Aorta:  No thoracic aortic aneurysm or dissection.    Pulmonary Vessels:  No evidence of acute pulmonary emboli through the major subsegmental branches.    Lungs and Airways:  There is patient respiratory motion artifact which limits evaluation of the lungs.      Bilateral lower lobe airspace disease with air bronchograms. Pneumonia versus atelectasis.      Pleura: Bilateral pleural effusions left larger than right    Upper Abdomen: Cirrhosis with splenomegaly. Small volume of ascites. Probable cholelithiasis.    Bones: Bone windows are unremarkable.        Impression    1.NO PULMONARY EMBOLUS  2.BILATERAL PLEURAL EFFUSIONS  3.BILATERAL LOWER LOBE AIRSPACE DISEASE. PNEUMONIA VERSUS ATELECTASIS      One or more dose reduction techniques were used (e.g., Automated exposure control, adjustment of the mA and/or kV according to patient size, use of iterative reconstruction technique).      Radiologist location ID: [id number]     XR AP MOBILE CHEST     Status: None    Narrative    Jashaun LEE Kalbfleisch    RADIOLOGIST: Colletta Maryland, MD    XR AP MOBILE CHEST performed on 05/11/2021 5:35 AM    CLINICAL HISTORY: respiratory failure.  resp fail-vent  TECHNIQUE: Frontal view of the chest.    COMPARISON:  Yesterday    FINDINGS:  The support lines and tubes are in stable and satisfactory position.   Cardiac and mediastinal contours are stable.   Bilateral airspace disease with bilateral pleural effusions similar to yesterday           Impression    NO ACUTE FINDINGS.      Radiologist location ID: [id number]     CT BRAIN WO IV CONTRAST     Status: None    Narrative    Lemoyne LEE Geigle    RADIOLOGIST: Colletta Maryland, MD    CT BRAIN WO IV CONTRAST performed on 05/12/2021 2:27 AM    CLINICAL HISTORY:  altered mental status.  Pt intubated and per RN very restless this early AM    TECHNIQUE:  Head CT without intravenous contrast.    COMPARISON: 05/01/2021  # of known CTs in the past 12 months: 8   # of known Cardiac Nuclear Medicine Studies in the past 12 months: 0    FINDINGS: This study is degraded by patient motion artifact  There is no acute intracranial hemorrhage, mass effect, or evidence of large acute infarct.    Brain: Low density in the periventricular white matter suggests mild chronic small vessel ischemic changes.    CSF Spaces: Mild generalized cerebral atrophy     Sinuses/Mastoids:  Clear at visualized levels     Bones: Unremarkable        Impression    CHRONIC CHANGES.  NO ACUTE FINDINGS.       One or more dose reduction techniques were used (e.g., Automated exposure control, adjustment of the mA and/or kV according to patient size, use of iterative reconstruction technique).      Radiologist location ID: [id number]     XR AP MOBILE CHEST     Status: None    Narrative    Edd LEE Kaiven    RADIOLOGIST: Colletta Maryland, MD    XR AP MOBILE CHEST performed on 05/12/2021 6:20 AM    CLINICAL HISTORY: respiratory failure.  vent management     TECHNIQUE: Frontal view of the chest.    COMPARISON:  Yesterday    FINDINGS:  The support lines and tubes are in stable and satisfactory position.   Cardiac and mediastinal contours are stable.   There is a left pleural effusion and bilateral airspace disease with possible right pleural effusion. Mild improvement in the right lung since yesterday.           Impression    BILATERAL AIRSPACE DISEASE AND PLEURAL EFFUSIONS LEFT WORSE      Radiologist location ID: [id number]     XR AP MOBILE CHEST     Status: None    Narrative    Drakkar LEE Reidinger    RADIOLOGIST: Colletta Maryland, MD    XR AP MOBILE CHEST performed on 05/13/2021 8:23 AM    CLINICAL HISTORY: respiratory failure, PNA.  resp.failure, PNA, vent    TECHNIQUE: Frontal view of the chest.    COMPARISON:   Yesterday    FINDINGS:  The support lines and tubes are in stable and satisfactory position.   Cardiac and mediastinal contours are stable.   There is a left pleural effusion unchanged from yesterday. The right lung is improved since yesterday with improved lung volumes.           Impression    PERSISTENT LEFT PLEURAL EFFUSION  Radiologist location ID: [id number]     XR AP MOBILE CHEST     Status: None    Narrative    Nthony LEE Mounger    RADIOLOGIST: Berton Bon, MD    XR AP MOBILE CHEST performed on 05/14/2021 6:07 AM    CLINICAL HISTORY: Pleural effusions.  vent management. pleural effusion    TECHNIQUE: Frontal view of the chest.    COMPARISON:  Yesterday    FINDINGS:  ET tube 4.8 cm above the carina.. There is an NG tube extending at least into the distal esophagus and obscured over the upper abdomen. There is a left PICC catheter in the SVC   Heart size is moderately enlarged.     There are perihilar infiltrates with possible effusions obscuring the diaphragms especially on the left           Impression    Stable infiltrates/vascular congestion with probable effusions      Radiologist location ID: [id number]     XR AP MOBILE CHEST     Status: None    Narrative    Zakar LEE Chivers    RADIOLOGIST: Brayton El, MD    XR AP MOBILE CHEST performed on 05/15/2021 6:04 AM    CLINICAL HISTORY: Pleural effusions.      vent management     TECHNIQUE: Frontal view of the chest.    COMPARISON:  05/13/2021    FINDINGS:  A left PICC ends in the upper SVC. The endotracheal tube ends 4 cm above the carina. The enteric tube ends below the diaphragm.     The heart size is normal.     There is improving aeration of both lungs most notably at both lung bases. A trace left pleural effusion persists.         Impression    Improving aeration of both lungs. The left pleural effusion appears to have decreased in size.       Radiologist location ID: [id number]     XR ABD SUPINE     Status: None    Narrative    Brayam LEE  Stiner    RADIOLOGIST: Brayton El, MD    XR ABD SUPINE performed on 05/15/2021 1:02 PM.    CLINICAL HISTORY: r/o ileus - abdominal distention.      ileus    TECHNIQUE: Single view abdomen.    COMPARISON:  None.      FINDINGS:  There is a diffuse gaseous distention of the small bowel. There is also gas within the cecum suggesting this could be an ileus.     No suspicious calcifications.    The bones are unremarkable.        Impression    Diffuse gaseous distention in a pattern suggestive of ileus. The enteric tube ends in the stomach.        Radiologist location ID: [id number]     MRI BRAIN W/WO CONTRAST     Status: None    Narrative    Treysean LEE Beedy    RADIOLOGIST: Betsey Amen, MD    MRI BRAIN W/WO CONTRAST performed on 05/15/2021 10:34 PM    CLINICAL HISTORY: inconsistant neuro exam.  Trauma, splenic laceration.  Unable to wake pt calmy since sedated.    TECHNIQUE: Routine brain MRI without and with intravenous contrast.   INTRAVENOUS CONTRAST: 20 ml's of Dotarem    COMPARISON:  None.    FINDINGS:  Brain: Normal signal intensities.    Diffusion weighted images show no  evidence of acute or recent infarct.   Postcontrast images show no suspicious enhancement.    Ventricles: Normal.    Major Intracranial Vessels: Normal flow voids.    Sinuses: Clear.  Mastoids: Small amount of fluid signal is identified within both mastoid air cells.          Impression    No acute finding.        Radiologist location ID: [id number]     XR AP MOBILE CHEST     Status: None    Narrative    Alvino LEE Popelka    RADIOLOGIST: Berton Bon, MD    XR AP MOBILE CHEST performed on 05/16/2021 6:55 AM    CLINICAL HISTORY: Pleural effusions.  RESP FAIL-VENT    TECHNIQUE: Frontal view of the chest.    COMPARISON:  Yesterday    FINDINGS:  ET tube 4.7 cm above the carina. There is an NG tube extending towards the stomach. The tip is not included on this film. There is a left PICC catheter in the SVC   Heart size is moderately enlarged.      There are bilateral perihilar and infrahilar infiltrates with obscuration of the left diaphragm           Impression    Slightly increased infiltrates/vascular congestion      Radiologist location ID: [id number]     XR KUB     Status: None    Narrative    Bush LEE Barnett    RADIOLOGIST: Berton Bon, MD    XR KUB performed on 05/16/2021 9:31 AM.    CLINICAL HISTORY: ileus.  evaluate ileus - abdominal distention    TECHNIQUE: Single view abdomen.    COMPARISON:  Yesterday    FINDINGS:  There is gaseous gastric distention. There is some mild diffuse gaseous small bowel distention. Some air is also suggested in the right colon    No suspicious calcifications.    There is lumbar spurring  There is an NG tube with the tip of the proximal stomach         Impression    1.Persistent gaseous intestinal distention suggesting an ileus. There is increased gastric distention since yesterday        Radiologist location ID: [id number]     [place] CHEST (EFFUSION)     Status: None    Narrative    Ziquan LEE Rufus    RADIOLOGIST: Brian Schambach    [place] CHEST (EFFUSION) performed on 05/16/2021 2:28 PM    CLINICAL HISTORY: evaluate for thoracentesis.  bilateral chest     COMPARISON: None.    FINDINGS:  Ultrasound was used to evaluate for pleural effusions.    There is a small amount of perihepatic ascites. There is a trace right effusion. No left pleural fluid is seen.       Impression    As above.        Radiologist location ID: [id number]     XR AP MOBILE CHEST     Status: None    Narrative    Korbin LEE Morrill    RADIOLOGIST: Danne Baxter, MD    XR AP MOBILE CHEST performed on 05/17/2021 6:07 AM    CLINICAL HISTORY: respiratory failure.    chest trauma.   fractured ribs.  RESP FAIL-VENT    TECHNIQUE: Frontal view of the chest.    COMPARISON:  Yesterday    FINDINGS:  The support lines and tubes are in stable and satisfactory position.   The  heart size is normal.   There is some stable mild opacity at both lung bases.   There is a stable  small left pleural effusion.        Impression    NO SIGNIFICANT CHANGE SINCE THE PRIOR EXAM.      Radiologist location ID: [id number]     XR KUB     Status: None    Narrative    Plumer LEE Warning    RADIOLOGIST: Danne Baxter, MD    XR KUB performed on 05/17/2021 8:25 AM.    CLINICAL HISTORY: ileus.  evaluate ileus    TECHNIQUE: Single view abdomen.    COMPARISON:  05/16/2021    FINDINGS:  There are markedly distended loops of small bowel with a paucity of air in colon. Findings indicate a small bowel obstruction. Gaseous distention of the stomach has decreased from the prior exam.    A nasogastric tube is present in the region of the stomach.    There are degenerative changes of the spine.        Impression    PERSISTENT MARKEDLY DISTENDED LOOPS OF SMALL BOWEL SUGGESTING AN OBSTRUCTION.        Radiologist location ID: [id number]     XR AP MOBILE CHEST     Status: None    Narrative    Dreydon LEE Diana    RADIOLOGIST: Ileene Hutchinson    XR AP MOBILE CHEST performed on 05/18/2021 6:50 AM    CLINICAL HISTORY: Chest pain/SOB, Left pleural effusion.  resp fail-vent    TECHNIQUE: Frontal view of the chest.    COMPARISON:  Yesterday    FINDINGS:  The support lines and tubes are in stable and satisfactory position.   The heart size is normal.   Bibasilar opacities and a small left pleural effusion are unchanged.   The bones are unremarkable.        Impression    NO SIGNIFICANT CHANGE SINCE THE PRIOR EXAM.      Radiologist location ID: [id number]     XR KUB     Status: None    Narrative    Riordan LEE Cheuvront    RADIOLOGIST: Brayton El, MD    XR KUB performed on 05/18/2021 10:21 AM.    CLINICAL HISTORY: f/u ileus.      ileus/vent    TECHNIQUE: Single view abdomen.    COMPARISON:  05/17/2021      FINDINGS:  There is diffuse gaseous distention of the small bowel and colon potentially representing ileus. An enteric tube is seen in the stomach.    There is a Foley catheter.    No suspicious calcifications.    Bibasilar  atelectasis is present. Mild cardiomegaly.           Impression    Diffuse gaseous distention of the bowel suggestive of an ileus.        Radiologist location ID: [id number]     XR KUB     Status: None    Narrative    Wilder LEE Walle    RADIOLOGIST: Danne Baxter, MD    XR KUB performed on 05/19/2021 6:19 AM.    CLINICAL HISTORY: f/u ileus.  re evaluate ileus     TECHNIQUE: Single view abdomen.    COMPARISON:  Yesterday    FINDINGS:  There is a nasogastric tube projecting in the region of the distal body of the stomach.    There are distended gas-filled loops of small bowel with a  similar degree of distention. Obstruction is suspected. Ileus is in differential.    There are degenerative changes of the spine.        Impression    NO SIGNIFICANT CHANGE FROM THE PRIOR STUDY.        Radiologist location ID: [id number]     XR AP MOBILE CHEST     Status: None    Narrative    Braylan LEE Dase    RADIOLOGIST: Danne Baxter, MD    XR AP MOBILE CHEST performed on 05/19/2021 6:20 AM    CLINICAL HISTORY: Chest pain/SOB, Left pleural effusion.  on vent     TECHNIQUE: Frontal view of the chest.    COMPARISON:  Yesterday    FINDINGS:  The support lines and tubes are in stable and satisfactory position.   The heart size is normal.   There is patchy by airspace disease with a layering left pleural effusion. Aeration is similar.           Impression    NO SIGNIFICANT CHANGE SINCE THE PRIOR EXAM.      Radiologist location ID: [id number]     XR AP MOBILE CHEST     Status: None    Narrative    Kerman LEE Pellman    RADIOLOGIST: Kelby Frame    XR AP MOBILE CHEST performed on 05/20/2021 6:09 AM    CLINICAL HISTORY: Chest pain/SOB, Left pleural effusion.  re eval effusions     TECHNIQUE: Frontal view of the chest.    COMPARISON:  Chest x-ray from one day ago    FINDINGS:  Endotracheal tube is no longer seen. NG tube tip is below the diaphragm in the stomach. PICC line tip at the distal SVC is stable   Cardiac and mediastinal contours are  stable.   Patchy infiltrative bilateral changes similar to the prior exam given difference in technique. Hazy appearance left lower chest likely indicating effusion.  Left rib fractures are seen. No obvious pneumothorax           Impression    NO SIGNIFICANT CHANGE SINCE THE PRIOR EXAM. (Extubated)      Radiologist location ID: [id number]     XR AP MOBILE CHEST     Status: None    Narrative    Maguire LEE Caffee    RADIOLOGIST: Dorisann Frames, MD    XR AP MOBILE CHEST performed on 05/21/2021 12:38 PM    CLINICAL HISTORY: dobhoff tube placement.  dobhoff tube placement    TECHNIQUE: Frontal view of the chest.    COMPARISON:  Yesterday    FINDINGS:  Large body habitus degrades image quality.  The Dobbhoff tube can be seen to just below the level of the GE junction. A high KUB may be helpful for further clarifying the position of the tip.  Left PICC is noted.   Cardiac and mediastinal contours are stable.   No significant change in the appearance of the lungs.   Patchy and confluent bilateral pulmonary infiltrates persist with dense consolidation/atelectasis at the left lung base.        Impression    DOBBHOFF TUBE IS DIFFICULT TO SEE THE LOWER MARGIN OF THE IMAGE. A HIGH KUB IMAGE MAY BE HELPFUL FOR FURTHER EVALUATION.      Radiologist location ID: [id number]     XR KUB     Status: None    Narrative    Bairon LEE Boomhower    RADIOLOGIST: Otho Najjar, MD  XR KUB performed on 05/21/2021 2:11 PM.    CLINICAL HISTORY: dobhoff placement.  for dobhoff tube placement.    TECHNIQUE: Single view abdomen.    COMPARISON:  None.    FINDINGS:  NG tube placement demonstrates Dobbhoff tube to be overlying the stomach appearing to be anatomically positioned.    Gas pattern is nonspecific with a single loop of what appears to be small bowel in the left mid abdomen noted.  No gross calcifications.      Impression    Anatomic positioning of NG tube.      Radiologist location ID: [id number]     FLUORO ESOPHAGRAM, MODIFIED  SWALLOW     Status: None    Narrative    Prophet LEE Bidinger    RADIOLOGIST: Otho Najjar, MD    FLUORO ESOPHAGRAM, MODIFIED SWALLOW performed on 05/22/2021 1:57 PM    CLINICAL HISTORY: dysphagia, cough post prolonged intubation. . .  dysphagia w/cough    TECHNIQUE:  Modified barium swallow was performed in conjunction with a member of the speech pathology department using various consistencies of barium and lateral fluoroscopic observation.     FLUOROSCOPIC TIME:  1.1 minutes  FLUOROGRAPHIC IMAGES: 13    FINDINGS:   The patient was given thin barium, nectar thick barium, applesauce, fruit and wafers mixed with barium.       Impression    Impression:  Thin barium:  Spoon: No penetration or aspiration.  Cup: No penetration or aspiration.  Straw: Penetration.    Nectar thick barium:  Spoon: Penetration area  Cup: No penetration or aspiration.  Straw: No penetration or aspiration.    Applesauce: No aspiration or penetration.  Fruit: After liquid barium squeezed out from the fruit, slight penetration.  Wafers mixed with barium: After liquid barium squeezed out, slight penetration.    PLEASE REFER TO SPEECH PATHOLOGIST'S REPORT FOR ADDITIONAL DETAILS.      Radiologist location ID: [id number]        All current radiology studies were reviewed.    PHYSICAL EXAMINATION  Temperature: 36.6 C (97.9 F)  Heart Rate: 71  BP (Non-Invasive): 130/70  Respiratory Rate: (!) 27  SpO2: 93 %    General:  No obvious discomfort.  No signs of respiratory distress.  05/24/2021- Patient is seen and examined , feeling much better, not in restraints, overall improved, lab work is stable, transferred to the step down on hospitalist service now ,    6/11-Patient is seen and examined , complaining of insomnia and unable to sleep at all, with lingering symptoms during the night ,   -6/12- patient is seen and examined.  Patient is lying in bed he is more alert oriented and caring conversation.  At this time patient is starting to eat but  advancement is very slow.  His vitals are stable.  HEENT patient is very unstable on his feet the time.  Working with physical therapy.        HEENT:  Normocephalic.  Orogastric tube in place.  Sclera are nonicteric.    Neck:  No obvious JVD.    Heart:  S1, S2.  No murmurs    Lungs:  Breath sounds are clear bilaterally;  slightly decreased at the bases left greater than right.    Abdomen:  Abdomen is still distended but not as firm as it was.  There are some bowel sounds present.  He still receiving the lactulose enemas.    Extremities:  No significant edema.  No cyanosis or clubbing  Neuro:  Nonfocal.  Responding to questions appropriately.      Skin - warm, no jaundice     Orogasttric Tube:  OROGASTRIC     Tube Feedings:  ON HOLD.     Psych- normal base line mood ,     PICC LINE:  LEFT ARM    INTRAVENOUS INFUSIONS: 1.  PRECEDEX AND KETAMINE DISCONTINUED.    FOLEY CATHETER:  IN PLACE.   LENGTH OF STAY INPUT AND OUTPUT SHOW A NET NEGATIVE                                                            OF  - 9.8 L.    PROBLEM LIST:   Active Hospital Problems   (*Primary Problem)    Diagnosis   . *Spleen injury   . Status post thoracentesis   . Ileus (CMS HCC)   . Leukopenia   . Encephalopathy   . Serum ammonia increased (CMS [place])   . Pleural effusion   . On mechanically assisted ventilation (CMS HCC)   . Multiple rib fractures   . Left pulmonary contusion   . Closed fracture of transverse process of thoracic vertebra (CMS HCC)   . 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 Thrombocytopenia (CMS HCC)   . Alcohol abuse     -  EXTUBATED ON 05/19/2021    PLAN:  1.  Continue supplemental O2 as needed. IS and pulmonary tolieting  2.  Speech evaluated   3.  Continue lactulose enemas.  4.  GI prophylaxis and DVT prophylaxis.  5.  Chest x-ray, CBC and chemistry in the morning.  6.  Continue bowel regimen.  7.  Continue IV fluid.  8.  Continue bowel regimen and also continue Reglan.  9.  PT/OT  -6/12- patient is seen and  examined.  Patient is lying in bed he is more alert oriented and caring conversation.  At this time patient is starting to eat but advancement is very slow.  His vitals are stable.  HEENT patient is very unstable on his feet the time.  Working with physical therapy.        Natalia Zavodchikov, DO

## 2021-05-27 NOTE — Care Plan (Cosign Needed)
Called to room by nurse that patient had a fall. Patient did not hit his head or lose consciousness. Visual inspection and palpation of spine/neck/head yield no findings of trauma, and no tenderness to palpation. Patient assisted to bed and re-examined.    Vitals: HR 83, BP 012QUIV systolic, SaO2 14%    Physical Exam:  - General: alert, no acute distress  - MSK: no obvious trauma, no tenderness to palpation of spine/neck/head  - Cardiovascular: RRR, no murmurs  - Respiratory: clear to auscultation bilaterally, no wheezes/rubs/crackles  - GI: normoactive bowel sounds, no tenderness to palpation, soft, nondistended  - GU: foley catheter in place draining dark yellow urine  - Neuro: AAOx3    No imaging necessary at this time  Primary team paged

## 2021-05-27 NOTE — PT Treatment (Signed)
[facility]  [place], [place] [address]  [phone]         Physical Therapy Acute Care Daily Treatment Record       Date: 05/27/2021  Time: 08:20  Patient's Name: Hector [name]  Date of Birth: [birth year]  Room Number: 653/A    Pertinent Information since last Physical Therapy visit :    Patient is more alert but still demonstrates confusion.      Precautions / Limitations :    Fall precautions     Weight Bearing status, if applicable :    No Weightbearing restrictions     Subjective :   Patient is alert this morning and states he would like to sit up. Patient denies pain     Pain Rating :   Pre-Treatment Pain: 0  Post-Treatment Pain: 0  Comments:                                  Orientation & Level of Orientation :    Alert, Cooperative and Confused      6 Clicks Score :    1.) Turning from back to side while flat in bed without using a bed rail.   A little assistance (3)  2.) Moving from lying on back to sitting on the side of a flat bed without using bed rails.  A little assistance (3)  3.) Moving to and from a bed to a chair, including a wheelchair.   A little assistance (3)  4.) Standing up for chair using your arms (eg, wheelchair or bedside chair).  A little assistance (3)  5.) Walking in hospital room.  A lot of assistance (2)  6.) Climbing 3-5 steps with a railing.   A lot of assistance (2)    6 Clicks Plan: Score <24 - PT consult is appropriate; placement may be necessary at discharge      Treatment :      Transfers:  Rolling in bed:    Minimal Assist  with assist of 1                                                Supine to sit:   Minimal Assist   with assist of 2                                Sit to stand:  Minimal Assist   with assist of 2                                                                Gait:     Distance:    Less than 10'              Device Utilized:   Hand Hold Assist          Level of Assist: Minimal Assist  with assist of 2      Weight-Bearing Status:   No Weightbearing restrictions                        Gait Deviations: Step length decreased and Balance impaired     Sitting Balance: Fair +                           Standing Balance:  Fair                   Stairs:                            Therapeutic Exercise:                  Therapeutic Activity:             Canalith Repositioning:             Other Treatment Interventions:       Daily Assessment of Status :    Patient performed supine to sit transfer x min A x 2 and sit to stand x min A x 2. Patient stood edge of bed x HHA x 2 and took 10 steps to bedside chair. Patient demonstrates decreased endurance and balance with gait.       Timed Charge Number of Minutes   Neuromuscular Re-Education     Gait Training  10   Therapeutic Activity     Therapeutic Exercise      Canalith Re-positioning   Non-timed charge    Other     Total Treatment Time  10     Patient Education :          Educated patient on proper gait sequence and safety.     Interdisciplinary Communication :    At end of session, patent was left up in bedside chair. Chair alarm was activated and all needs were within reach. Nursing notified.            Discharge Planning :   Skilled Care    Plan :   Continue Physical Therapy program     Nada Libman, [place]  05/27/2021, 15:00

## 2021-05-27 NOTE — OT Evaluation (Signed)
Hector [name]  Occupational Therapy Progress Note    Patient Name: Hector [name]  Date of Birth: [birth year]  Height:  182.9 cm (6')  Weight:  102 kg (225 lb 5 oz)  Room/Bed: 653/A  Payor: [place] / Plan: HEALTH PLAN MEDICAID / Product Type: Medicaid [place] /     Assessment:       05/27/21 0818   Daily Activity AM-PAC/6-clicks Score   Putting on/Taking off clothing on lower body 1   Bathing 2   Toileting 2   Putting on/Taking off clothing on upper body 3   Personal grooming 3   Eating Meals 3   Raw Score Total 14   Standardized (t-scale) Score 33.39   CMS 0-100% Score 59.67   CMS Modifier CK         Discharge Needs:   Equipment Recommendation:TBD    Discharge Disposition: Home with home health    Plan:   Continue to follow patient according to established plan of care.  The risks/benefits of therapy have been discussed with the patient/caregiver and he/she is in agreement with the established plan of care.     Subjective & Objective:     Pt seen this day with clearance from nursing, pt was seated up in chair, alert and willing to participate in therapy, pt was mod a x2 for sit to stand and complete transfer from cardiac to armed and back,  pt was mod a x2 for supine to edge of bed, displayed fair sitting balance at edge of bed, pt was s/u for washing and combing hair. Pt remained seated up in chair with all needs met and alarm in place upon OT exit from room.    Pain: no c/o pain or signs of distress.    Level of consciousness alert    Orientation x3      Patient education transfer safety            Therapist:   Sharlett Iles, COTA     Start Time: 0818  End Time: 0830  Total Treatment Time: 12 minutes  Charges Entered: therapeutic activity  Department Number: [phone]

## 2021-05-27 NOTE — Speech Therapy (Signed)
[facility]  Inpatient Speech Therapy  [address]  [place], [place] [address]  Phone: [phone]  Fax: [phone]    Inpatient Dysphagia Treatment       Patient subjective: Patient seen for swallowing treatment after clearance from RN.  Patient was agreeable to swallowing treatment.   Current diet consistency at time of treatment: Puree and Nectar Thick/Mildly Thick/ Level 2  Recent CXR:   Recent Results (from the past 720 hour(s))   XR AP MOBILE CHEST    Collection Time: 05/21/21 12:38 PM    Narrative    Coralyn Mark LEE [place]link[place]    RADIOLOGIST: Dorisann Frames, MD    XR AP MOBILE CHEST performed on 05/21/2021 12:38 PM    CLINICAL HISTORY: dobhoff tube placement.  dobhoff tube placement    TECHNIQUE: Frontal view of the chest.    COMPARISON:  Yesterday    FINDINGS:  Large body habitus degrades image quality.  The Dobbhoff tube can be seen to just below the level of the GE junction. A high KUB may be helpful for further clarifying the position of the tip.  Left PICC is noted.   Cardiac and mediastinal contours are stable.   No significant change in the appearance of the lungs.   Patchy and confluent bilateral pulmonary infiltrates persist with dense consolidation/atelectasis at the left lung base.        Impression    DOBBHOFF TUBE IS DIFFICULT TO SEE THE LOWER MARGIN OF THE IMAGE. A HIGH KUB IMAGE MAY BE HELPFUL FOR FURTHER EVALUATION.      Radiologist location ID: [id number]       Oxygenation: Nasal cannula  Dentition: Natural Dentition  Mucosa Status: Clean/Moist   Feeding ability during treatment: Liquids presented  and controlled by SLP and Solids presented and controlled by SLP  Position during treatment: Fully upright in bed  Mental Status: Alert, Responsive and Cooperative    Therapeutic Trials/Feeds     Liquid Consistency : Nectar thick/Mildly Thick/Level 2  Route/Volume of Presentation :TSP x5 and Single cup sip x5  Oral Phase: WFL, no significant oral dysphagia noted at bedside  with this consistency   Pharyngeal Phase: No overt s/s of aspiration observed at bedside during subjective assessment and Appears WFL, no significant pharyngeal dysphagia noted at bedside with this consistency     Liquid Consistency : Thin/Level 0  Route/Volume of Presentation :TSP x10 and Single cup sip x5  Oral Phase: WFL, no significant oral dysphagia noted at bedside with this consistency   Pharyngeal Phase: Coughing mildly following 1/5 trials of thin by cup, no coughing following 10/10 trials of thin by spoon.      Solid Consistency: mechanical soft  Route/Volume of Presentation: Patient was presented with single pieces of mechanical soft peaches x10 and 2 pieces of diced peaches x6.    Oral Phase: Mastication extended, but adequate, A/P Transit WFL and Bolus organization fair/adequate  Pharyngeal Phase: Throat Clearing      Therapeutic Exercises     Lingual Pullbacks; patient completed 10/10 and Tongue Press; patient completed 10/10         Treatment Summary     Good treatment tolerance, Effectively completes swallowing exercises , Stable to remain on current diet and Progressing towards goals/qualitative gains in function       Plan     Diet Recommendations: Puree and Nectar Thick/Mildly Thick/ Level 2  Aspiration Precautions/Compensatory Strategies: Elevate HOB to a 90 degree seated position, No straws, Single sips, Small  bites, Oral care after meals, Swallow each bite prior to taking next bite, Take breaks with SOB , Only feed when fully alert and Reflux precautions/remain upright after meals for 30 mins   Medication administration: Pills crushed in applesauce/pudding (if crushable)  Feeding Assistance: Dependent feed  General plan of care: Therapeutic trials for possible advancement , Therapeutic exercises to strengthen the swallowing mechanism , Aspiration precautions and Patient/family education/training  Recommended Diagnostics: N/A  Results & Recommendations/ Education provided to: Patient, Nurse and  Family regarding Current diet, Aspiration risks and precautions and Exercises  Is continued treatment indicated? Yes; continue current plan of care    Therapist:   Dahlia Byes, SLP      Dysphagia Treatment Time: 13 minutes

## 2021-05-27 NOTE — Progress Notes (Signed)
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 edema. Clinical correlation recommended.      Impression    Increasing opacity bilaterally suggesting increasing CHF. Clinical correlation recommended.      Radiologist location ID: [id number]     XR AP MOBILE CHEST     Status: None    Narrative    Javonne LEE Othman    RADIOLOGIST: Colletta Maryland, MD    XR AP MOBILE CHEST performed on 05/10/2021 6:19 AM    CLINICAL HISTORY: respiratory failure.  resp fail-vent    TECHNIQUE: Frontal view of the chest.    COMPARISON:  05/09/2021    FINDINGS:  The support lines and tubes are in stable and satisfactory position.   Cardiac and mediastinal contours are stable.   Airspace disease is present in the mid to lower lungs with probable bilateral pleural effusions. Lung volumes are slightly improved since yesterday           Impression    BILATERAL AIRSPACE DISEASE AND PLEURAL EFFUSIONS WITH SOME IMPROVEMENT SINCE YESTERDAY      Radiologist location ID: [id number]     CT ANGIO CHEST FOR PULMONARY EMBOLUS W IV CONTRAST     Status: None    Narrative    Borna LEE Radice    RADIOLOGIST: Colletta Maryland, MD    CT ANGIO CHEST FOR PULMONARY EMBOLUS W IV CONTRAST performed on 05/10/2021 11:49 AM    CLINICAL HISTORY: hypoxia.  on the Vent , increased hypoxia. CXR showed increased opacities  Increase in CHF  Lung sounds coarse  . fall on 05-01-21    TECHNIQUE: CTA imaging of the  chest with intravenous contrast.  3D reconstructions.  CONTRAST:  100 ml's of Isovue 370    COMPARISON: 05/03/2021  # of known CTs in the past 12 months: 7   # of known Cardiac Nuclear Medicine Studies in the past 12 months: 0         FINDINGS:  Hardware:  There is an NG tube in the stomach. An ET tube is present in the trachea. There is a left-sided PICC line in the SVC.    Lymph nodes:   No mediastinal, hilar, or axillary lymphadenopathy.    Heart:  Coronary artery calcifications are noted.        RV/LV Diameter Ratio: N/A    Thoracic Aorta:  No thoracic aortic aneurysm or dissection.    Pulmonary Vessels:  No evidence of acute pulmonary emboli through the major subsegmental branches.    Lungs and Airways:  There is patient respiratory motion artifact which limits evaluation of the lungs.      Bilateral lower lobe airspace disease with air bronchograms. Pneumonia versus atelectasis.      Pleura: Bilateral pleural effusions left larger than right    Upper Abdomen: Cirrhosis with splenomegaly. Small volume of ascites. Probable cholelithiasis.    Bones: Bone windows are unremarkable.        Impression    1.NO PULMONARY EMBOLUS  2.BILATERAL PLEURAL EFFUSIONS  3.BILATERAL LOWER LOBE AIRSPACE DISEASE. PNEUMONIA VERSUS ATELECTASIS      One or more dose reduction techniques were used (e.g., Automated exposure control, adjustment of the mA and/or kV according to patient size, use of iterative reconstruction technique).  Radiologist location ID: [id number]     XR AP MOBILE CHEST     Status: None    Narrative    Brogen LEE Lamica    RADIOLOGIST: Colletta Maryland, MD    XR AP MOBILE CHEST performed on 05/11/2021 5:35 AM    CLINICAL HISTORY: respiratory failure.  resp fail-vent    TECHNIQUE: Frontal view of the chest.    COMPARISON:  Yesterday    FINDINGS:  The support lines and tubes are in stable and satisfactory position.   Cardiac and mediastinal contours are stable.   Bilateral airspace disease with bilateral pleural  effusions similar to yesterday           Impression    NO ACUTE FINDINGS.      Radiologist location ID: [id number]     CT BRAIN WO IV CONTRAST     Status: None    Narrative    Wadie LEE Gaynor    RADIOLOGIST: Colletta Maryland, MD    CT BRAIN WO IV CONTRAST performed on 05/12/2021 2:27 AM    CLINICAL HISTORY: altered mental status.  Pt intubated and per RN very restless this early AM    TECHNIQUE:  Head CT without intravenous contrast.    COMPARISON: 05/01/2021  # of known CTs in the past 12 months: 8   # of known Cardiac Nuclear Medicine Studies in the past 12 months: 0    FINDINGS: This study is degraded by patient motion artifact  There is no acute intracranial hemorrhage, mass effect, or evidence of large acute infarct.    Brain: Low density in the periventricular white matter suggests mild chronic small vessel ischemic changes.    CSF Spaces: Mild generalized cerebral atrophy     Sinuses/Mastoids:  Clear at visualized levels     Bones: Unremarkable        Impression    CHRONIC CHANGES.  NO ACUTE FINDINGS.       One or more dose reduction techniques were used (e.g., Automated exposure control, adjustment of the mA and/or kV according to patient size, use of iterative reconstruction technique).      Radiologist location ID: [id number]     XR AP MOBILE CHEST     Status: None    Narrative    Jamicah LEE Nehring    RADIOLOGIST: Colletta Maryland, MD    XR AP MOBILE CHEST performed on 05/12/2021 6:20 AM    CLINICAL HISTORY: respiratory failure.  vent management     TECHNIQUE: Frontal view of the chest.    COMPARISON:  Yesterday    FINDINGS:  The support lines and tubes are in stable and satisfactory position.   Cardiac and mediastinal contours are stable.   There is a left pleural effusion and bilateral airspace disease with possible right pleural effusion. Mild improvement in the right lung since yesterday.           Impression    BILATERAL AIRSPACE DISEASE AND PLEURAL EFFUSIONS LEFT WORSE      Radiologist location ID:  [id number]     XR AP MOBILE CHEST     Status: None    Narrative    Margarito LEE Pasha    RADIOLOGIST: Colletta Maryland, MD    XR AP MOBILE CHEST performed on 05/13/2021 8:23 AM    CLINICAL HISTORY: respiratory failure, PNA.  resp.failure, PNA, vent    TECHNIQUE: Frontal view of the chest.    COMPARISON:  Yesterday    FINDINGS:  The support  lines and tubes are in stable and satisfactory position.   Cardiac and mediastinal contours are stable.   There is a left pleural effusion unchanged from yesterday. The right lung is improved since yesterday with improved lung volumes.           Impression    PERSISTENT LEFT PLEURAL EFFUSION      Radiologist location ID: [id number]     XR AP MOBILE CHEST     Status: None    Narrative    Keldrick LEE Mccrory    RADIOLOGIST: Berton Bon, MD    XR AP MOBILE CHEST performed on 05/14/2021 6:07 AM    CLINICAL HISTORY: Pleural effusions.  vent management. pleural effusion    TECHNIQUE: Frontal view of the chest.    COMPARISON:  Yesterday    FINDINGS:  ET tube 4.8 cm above the carina.. There is an NG tube extending at least into the distal esophagus and obscured over the upper abdomen. There is a left PICC catheter in the SVC   Heart size is moderately enlarged.     There are perihilar infiltrates with possible effusions obscuring the diaphragms especially on the left           Impression    Stable infiltrates/vascular congestion with probable effusions      Radiologist location ID: [id number]     XR AP MOBILE CHEST     Status: None    Narrative    Otis LEE Mannina    RADIOLOGIST: Brayton El, MD    XR AP MOBILE CHEST performed on 05/15/2021 6:04 AM    CLINICAL HISTORY: Pleural effusions.      vent management     TECHNIQUE: Frontal view of the chest.    COMPARISON:  05/13/2021    FINDINGS:  A left PICC ends in the upper SVC. The endotracheal tube ends 4 cm above the carina. The enteric tube ends below the diaphragm.     The heart size is normal.     There is improving aeration of both  lungs most notably at both lung bases. A trace left pleural effusion persists.         Impression    Improving aeration of both lungs. The left pleural effusion appears to have decreased in size.       Radiologist location ID: [id number]     XR ABD SUPINE     Status: None    Narrative    Amar LEE Nawrot    RADIOLOGIST: Brayton El, MD    XR ABD SUPINE performed on 05/15/2021 1:02 PM.    CLINICAL HISTORY: r/o ileus - abdominal distention.      ileus    TECHNIQUE: Single view abdomen.    COMPARISON:  None.      FINDINGS:  There is a diffuse gaseous distention of the small bowel. There is also gas within the cecum suggesting this could be an ileus.     No suspicious calcifications.    The bones are unremarkable.        Impression    Diffuse gaseous distention in a pattern suggestive of ileus. The enteric tube ends in the stomach.        Radiologist location ID: [id number]     MRI BRAIN W/WO CONTRAST     Status: None    Narrative    Bonham LEE Glore    RADIOLOGIST: Betsey Amen, MD    MRI BRAIN W/WO CONTRAST performed on 05/15/2021 10:34 PM  CLINICAL HISTORY: inconsistant neuro exam.  Trauma, splenic laceration.  Unable to wake pt calmy since sedated.    TECHNIQUE: Routine brain MRI without and with intravenous contrast.   INTRAVENOUS CONTRAST: 20 ml's of Dotarem    COMPARISON:  None.    FINDINGS:  Brain: Normal signal intensities.    Diffusion weighted images show no evidence of acute or recent infarct.   Postcontrast images show no suspicious enhancement.    Ventricles: Normal.    Major Intracranial Vessels: Normal flow voids.    Sinuses: Clear.  Mastoids: Small amount of fluid signal is identified within both mastoid air cells.          Impression    No acute finding.        Radiologist location ID: [id number]     XR AP MOBILE CHEST     Status: None    Narrative    Tequan LEE Bergin    RADIOLOGIST: Berton Bon, MD    XR AP MOBILE CHEST performed on 05/16/2021 6:55 AM    CLINICAL HISTORY: Pleural  effusions.  RESP FAIL-VENT    TECHNIQUE: Frontal view of the chest.    COMPARISON:  Yesterday    FINDINGS:  ET tube 4.7 cm above the carina. There is an NG tube extending towards the stomach. The tip is not included on this film. There is a left PICC catheter in the SVC   Heart size is moderately enlarged.     There are bilateral perihilar and infrahilar infiltrates with obscuration of the left diaphragm           Impression    Slightly increased infiltrates/vascular congestion      Radiologist location ID: [id number]     XR KUB     Status: None    Narrative    Braylin LEE Mcduffee    RADIOLOGIST: Berton Bon, MD    XR KUB performed on 05/16/2021 9:31 AM.    CLINICAL HISTORY: ileus.  evaluate ileus - abdominal distention    TECHNIQUE: Single view abdomen.    COMPARISON:  Yesterday    FINDINGS:  There is gaseous gastric distention. There is some mild diffuse gaseous small bowel distention. Some air is also suggested in the right colon    No suspicious calcifications.    There is lumbar spurring  There is an NG tube with the tip of the proximal stomach         Impression    1.Persistent gaseous intestinal distention suggesting an ileus. There is increased gastric distention since yesterday        Radiologist location ID: [id number]     [place] CHEST (EFFUSION)     Status: None    Narrative    Khoi LEE Grey    RADIOLOGIST: Brian Schambach    [place] CHEST (EFFUSION) performed on 05/16/2021 2:28 PM    CLINICAL HISTORY: evaluate for thoracentesis.  bilateral chest     COMPARISON: None.    FINDINGS:  Ultrasound was used to evaluate for pleural effusions.    There is a small amount of perihepatic ascites. There is a trace right effusion. No left pleural fluid is seen.       Impression    As above.        Radiologist location ID: [id number]     XR AP MOBILE CHEST     Status: None    Narrative    Chananya LEE Szczesniak    RADIOLOGIST: Danne Baxter, MD    XR AP  MOBILE CHEST performed on 05/17/2021 6:07 AM    CLINICAL HISTORY: respiratory  failure.    chest trauma.   fractured ribs.  RESP FAIL-VENT    TECHNIQUE: Frontal view of the chest.    COMPARISON:  Yesterday    FINDINGS:  The support lines and tubes are in stable and satisfactory position.   The heart size is normal.   There is some stable mild opacity at both lung bases.   There is a stable small left pleural effusion.        Impression    NO SIGNIFICANT CHANGE SINCE THE PRIOR EXAM.      Radiologist location ID: [id number]     XR KUB     Status: None    Narrative    Jaimen LEE Sago    RADIOLOGIST: Danne Baxter, MD    XR KUB performed on 05/17/2021 8:25 AM.    CLINICAL HISTORY: ileus.  evaluate ileus    TECHNIQUE: Single view abdomen.    COMPARISON:  05/16/2021    FINDINGS:  There are markedly distended loops of small bowel with a paucity of air in colon. Findings indicate a small bowel obstruction. Gaseous distention of the stomach has decreased from the prior exam.    A nasogastric tube is present in the region of the stomach.    There are degenerative changes of the spine.        Impression    PERSISTENT MARKEDLY DISTENDED LOOPS OF SMALL BOWEL SUGGESTING AN OBSTRUCTION.        Radiologist location ID: [id number]     XR AP MOBILE CHEST     Status: None    Narrative    Olivier LEE Fahy    RADIOLOGIST: Ileene Hutchinson    XR AP MOBILE CHEST performed on 05/18/2021 6:50 AM    CLINICAL HISTORY: Chest pain/SOB, Left pleural effusion.  resp fail-vent    TECHNIQUE: Frontal view of the chest.    COMPARISON:  Yesterday    FINDINGS:  The support lines and tubes are in stable and satisfactory position.   The heart size is normal.   Bibasilar opacities and a small left pleural effusion are unchanged.   The bones are unremarkable.        Impression    NO SIGNIFICANT CHANGE SINCE THE PRIOR EXAM.      Radiologist location ID: [id number]     XR KUB     Status: None    Narrative    Nnamdi LEE Feld    RADIOLOGIST: Brayton El, MD    XR KUB performed on 05/18/2021 10:21 AM.    CLINICAL HISTORY: f/u ileus.       ileus/vent    TECHNIQUE: Single view abdomen.    COMPARISON:  05/17/2021      FINDINGS:  There is diffuse gaseous distention of the small bowel and colon potentially representing ileus. An enteric tube is seen in the stomach.    There is a Foley catheter.    No suspicious calcifications.    Bibasilar atelectasis is present. Mild cardiomegaly.           Impression    Diffuse gaseous distention of the bowel suggestive of an ileus.        Radiologist location ID: [id number]     XR KUB     Status: None    Narrative    Davarious LEE Talcott    RADIOLOGIST: Danne Baxter, MD    XR KUB performed on 05/19/2021 6:19  AM.    CLINICAL HISTORY: f/u ileus.  re evaluate ileus     TECHNIQUE: Single view abdomen.    COMPARISON:  Yesterday    FINDINGS:  There is a nasogastric tube projecting in the region of the distal body of the stomach.    There are distended gas-filled loops of small bowel with a similar degree of distention. Obstruction is suspected. Ileus is in differential.    There are degenerative changes of the spine.        Impression    NO SIGNIFICANT CHANGE FROM THE PRIOR STUDY.        Radiologist location ID: [id number]     XR AP MOBILE CHEST     Status: None    Narrative    Ketan LEE Brotherton    RADIOLOGIST: Danne Baxter, MD    XR AP MOBILE CHEST performed on 05/19/2021 6:20 AM    CLINICAL HISTORY: Chest pain/SOB, Left pleural effusion.  on vent     TECHNIQUE: Frontal view of the chest.    COMPARISON:  Yesterday    FINDINGS:  The support lines and tubes are in stable and satisfactory position.   The heart size is normal.   There is patchy by airspace disease with a layering left pleural effusion. Aeration is similar.           Impression    NO SIGNIFICANT CHANGE SINCE THE PRIOR EXAM.      Radiologist location ID: [id number]     XR AP MOBILE CHEST     Status: None    Narrative    Layken LEE Shults    RADIOLOGIST: Kelby Frame    XR AP MOBILE CHEST performed on 05/20/2021 6:09 AM    CLINICAL HISTORY: Chest pain/SOB, Left pleural  effusion.  re eval effusions     TECHNIQUE: Frontal view of the chest.    COMPARISON:  Chest x-ray from one day ago    FINDINGS:  Endotracheal tube is no longer seen. NG tube tip is below the diaphragm in the stomach. PICC line tip at the distal SVC is stable   Cardiac and mediastinal contours are stable.   Patchy infiltrative bilateral changes similar to the prior exam given difference in technique. Hazy appearance left lower chest likely indicating effusion.  Left rib fractures are seen. No obvious pneumothorax           Impression    NO SIGNIFICANT CHANGE SINCE THE PRIOR EXAM. (Extubated)      Radiologist location ID: [id number]     XR AP MOBILE CHEST     Status: None    Narrative    Trea LEE Lansdale    RADIOLOGIST: Dorisann Frames, MD    XR AP MOBILE CHEST performed on 05/21/2021 12:38 PM    CLINICAL HISTORY: dobhoff tube placement.  dobhoff tube placement    TECHNIQUE: Frontal view of the chest.    COMPARISON:  Yesterday    FINDINGS:  Large body habitus degrades image quality.  The Dobbhoff tube can be seen to just below the level of the GE junction. A high KUB may be helpful for further clarifying the position of the tip.  Left PICC is noted.   Cardiac and mediastinal contours are stable.   No significant change in the appearance of the lungs.   Patchy and confluent bilateral pulmonary infiltrates persist with dense consolidation/atelectasis at the left lung base.        Impression    DOBBHOFF TUBE IS DIFFICULT  TO SEE THE LOWER MARGIN OF THE IMAGE. A HIGH KUB IMAGE MAY BE HELPFUL FOR FURTHER EVALUATION.      Radiologist location ID: [id number]     XR KUB     Status: None    Narrative    Myreon LEE Bernabe    RADIOLOGIST: Otho Najjar, MD    XR KUB performed on 05/21/2021 2:11 PM.    CLINICAL HISTORY: dobhoff placement.  for dobhoff tube placement.    TECHNIQUE: Single view abdomen.    COMPARISON:  None.    FINDINGS:  NG tube placement demonstrates Dobbhoff tube to be overlying the stomach appearing to  be anatomically positioned.    Gas pattern is nonspecific with a single loop of what appears to be small bowel in the left mid abdomen noted.  No gross calcifications.      Impression    Anatomic positioning of NG tube.      Radiologist location ID: [id number]     FLUORO ESOPHAGRAM, MODIFIED SWALLOW     Status: None    Narrative    Aul LEE Schaben    RADIOLOGIST: Otho Najjar, MD    FLUORO ESOPHAGRAM, MODIFIED SWALLOW performed on 05/22/2021 1:57 PM    CLINICAL HISTORY: dysphagia, cough post prolonged intubation. . .  dysphagia w/cough    TECHNIQUE:  Modified barium swallow was performed in conjunction with a member of the speech pathology department using various consistencies of barium and lateral fluoroscopic observation.     FLUOROSCOPIC TIME:  1.1 minutes  FLUOROGRAPHIC IMAGES: 13    FINDINGS:   The patient was given thin barium, nectar thick barium, applesauce, fruit and wafers mixed with barium.       Impression    Impression:  Thin barium:  Spoon: No penetration or aspiration.  Cup: No penetration or aspiration.  Straw: Penetration.    Nectar thick barium:  Spoon: Penetration area  Cup: No penetration or aspiration.  Straw: No penetration or aspiration.    Applesauce: No aspiration or penetration.  Fruit: After liquid barium squeezed out from the fruit, slight penetration.  Wafers mixed with barium: After liquid barium squeezed out, slight penetration.    PLEASE REFER TO SPEECH PATHOLOGIST'S REPORT FOR ADDITIONAL DETAILS.      Radiologist location ID: [id number]        All current radiology studies were reviewed.    PHYSICAL EXAMINATION  Temperature: 36.6 C (97.9 F)  Heart Rate: 71  BP (Non-Invasive): 130/70  Respiratory Rate: (!) 27  SpO2: 93 %    General:  No obvious discomfort.  No signs of respiratory distress.  05/24/2021- Patient is seen and examined , feeling much better, not in restraints, overall improved, lab work is stable, transferred to the step down on hospitalist service now  ,    6/11-Patient is seen and examined , complaining of insomnia and unable to sleep at all, with lingering symptoms during the night ,   -6/12- patient is seen and examined.  Patient is lying in bed he is more alert oriented and caring conversation.  At this time patient is starting to eat but advancement is very slow.  His vitals are stable.  HEENT patient is very unstable on his feet the time.  Working with physical therapy.    05/27/2021- patient is seen and examined.  He is sitting in bed his wife is at the bedside.  Patient is more alert and actually had more of the conversation with even joking.  He tried to  get up today and did not have a proper slippers and fell down near the bed but he did not his head he had or any other parts of his body he slid very slow.  At this time patient is going to try to work with physical therapy while there in the room but states that he is hungry for regular food and that to be advanced.  Speech therapy is coming to re-evaluate him tomorrow because today his evaluation ill residual weakness.          HEENT:  Normocephalic.  Orogastric tube in place.  Sclera are nonicteric.    Neck:  No obvious JVD.    Heart:  S1, S2.  No murmurs    Lungs:  Breath sounds are clear bilaterally;  slightly decreased at the bases left greater than right.    Abdomen:  Abdomen is still distended but not as firm as it was.  There are some bowel sounds present.  He still receiving the lactulose enemas.    Extremities:  No significant edema.  No cyanosis or clubbing    Neuro:  Nonfocal.  Responding to questions appropriately.      Skin - warm, no jaundice     Orogasttric Tube:  OROGASTRIC     Tube Feedings:  ON HOLD.     Psych- normal base line mood ,     PICC LINE:  LEFT ARM    INTRAVENOUS INFUSIONS: 1.  PRECEDEX AND KETAMINE DISCONTINUED.    FOLEY CATHETER:  IN PLACE.   LENGTH OF STAY INPUT AND OUTPUT SHOW A NET NEGATIVE                                                            OF  - 9.8  L.    PROBLEM LIST:   Active Hospital Problems   (*Primary Problem)    Diagnosis   . *Spleen injury   . Status post thoracentesis   . Ileus (CMS HCC)   . Leukopenia   . Encephalopathy   . Serum ammonia increased (CMS [place])   . Pleural effusion   . On mechanically assisted ventilation (CMS HCC)   . Multiple rib fractures   . Left pulmonary contusion   . Closed fracture of transverse process of thoracic vertebra (CMS HCC)   . 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 Thrombocytopenia (CMS HCC)   . Alcohol abuse     -  EXTUBATED ON 05/19/2021    PLAN:  1.  Continue supplemental O2 as needed. IS and pulmonary tolieting  2.  Speech evaluated   3.  Continue lactulose enemas.  4.  GI prophylaxis and DVT prophylaxis.  5.  Chest x-ray, CBC and chemistry in the morning.  6.  Continue bowel regimen.  7.  Continue IV fluid.  8.  Continue bowel regimen and also continue Reglan.  9.  PT/OT  -6/12- patient is seen and examined.  Patient is lying in bed he is more alert oriented and caring conversation.  At this time patient is starting to eat but advancement is very slow.  His vitals are stable.  HEENT patient is very unstable on his feet the time.  Working with physical therapy.  -05/27/2021- patient is seen and examined.  He is sitting in  bed his wife is at the bedside.  Patient is more alert and actually had more of the conversation with even joking.  He tried to get up today and did not have a proper slippers and fell down near the bed but he did not his head he had or any other parts of his body he slid very slow.  At this time patient is going to try to work with physical therapy while there in the room but states that he is hungry for regular food and that to be advanced.  Speech therapy is coming to re-evaluate him tomorrow because today his evaluation ill residual weakness.    Natalia Zavodchikov, DO

## 2021-05-27 NOTE — Care Management Notes (Signed)
6/13  On CVSD from ICU  Remains confused    Having difficulty swallowing  Suctioned    PT/OT consulted   Had been up in chair and tried to get up and fell this AM   SS  LTAC consulted   CM will follow for D/C needs

## 2021-05-27 NOTE — Care Management Notes (Signed)
Patient discussed in [place] rounds today.  PT recommends IR or LTAC.  Referral to LTAC.  If not appropriate, SS will discuss IR with patient's HCS.  SS will follow.

## 2021-05-28 NOTE — OT Treatment (Signed)
[facility]  Occupational Therapy Progress Note    Patient Name: Hector [name]  Date of Birth: [birth year]  Height:  182.9 cm (6')  Weight:  102 kg (225 lb 5 oz)  Room/Bed: 653/A  Payor: [employer] / Plan: HEALTH PLAN MEDICAID / Product Type: Medicaid [facility] /     Assessment:       05/28/21 0809   Daily Activity AM-PAC/6-clicks Score   Putting on/Taking off clothing on lower body 2   Bathing 2   Toileting 2   Putting on/Taking off clothing on upper body 3   Personal grooming 3   Eating Meals 3   Raw Score Total 15   Standardized (t-scale) Score 34.69   CMS 0-100% Score 56.46   CMS Modifier CK         Discharge Needs:   Equipment Recommendation:TBD    Discharge Disposition: [facility]    Plan:   Continue to follow patient according to established plan of care.  The risks/benefits of therapy have been discussed with the patient/caregiver and he/she is in agreement with the established plan of care.     Subjective & Objective:     Pt seen this day with clearance from nursing, pt was mod a x2 for supine to edge of bed, displayed fair sitting balance at edge of bed, min a x2 for sit to stand and maintain with DME for pivot transfer to cardiac chair where pt remained with all needs met and alarm in place, breaks applied on chair.    Pain: no reports of pain    Level of consciousness alert    Orientation x3        Patient education transfer safety            Therapist:   Sharlett Iles, COTA     Start Time: 0809  End Time: 0821  Total Treatment Time: 12 minutes  Charges Entered: therapeutic activity  Department Number: [phone]

## 2021-05-28 NOTE — Progress Notes (Signed)
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     Status: None    Narrative    Mahari LEE Sehgal    RADIOLOGIST: Otho Najjar, MD    XR AP MOBILE CHEST performed on 05/09/2021 6:15 AM    CLINICAL HISTORY: Mult. L rib fx.s, effusion, intubated.  resp fail-vent, effusion, multiple rib fx's    TECHNIQUE: Frontal view of the chest.    COMPARISON:  05/07/2021    FINDINGS:  The support lines and tubes are in stable and satisfactory position. No pneumothorax.     There is increasing opacity bilaterally in the mid lung fields with infiltrate, effusion and vascular congestion. This could be inflammatory but the symmetry suggests development of pulmonary edema. Clinical correlation recommended.      Impression    Increasing opacity bilaterally suggesting increasing CHF. Clinical correlation recommended.      Radiologist location ID: [id number]     XR AP MOBILE CHEST     Status: None    Narrative    Jeven LEE Akerley    RADIOLOGIST: Colletta Maryland, MD    XR AP MOBILE CHEST performed on 05/10/2021 6:19 AM    CLINICAL HISTORY: respiratory failure.  resp fail-vent    TECHNIQUE: Frontal view of the chest.    COMPARISON:  05/09/2021    FINDINGS:  The support lines and tubes are in stable and satisfactory position.   Cardiac and mediastinal contours are stable.   Airspace disease is present in the mid to lower lungs with probable bilateral pleural effusions. Lung volumes are slightly improved  since yesterday           Impression    BILATERAL AIRSPACE DISEASE AND PLEURAL EFFUSIONS WITH SOME IMPROVEMENT SINCE YESTERDAY      Radiologist location ID: [id number]     CT ANGIO CHEST FOR PULMONARY EMBOLUS W IV CONTRAST     Status: None    Narrative    Tobyn LEE Sagun    RADIOLOGIST: Colletta Maryland, MD    CT ANGIO CHEST FOR PULMONARY EMBOLUS W IV CONTRAST performed on 05/10/2021 11:49 AM    CLINICAL HISTORY: hypoxia.  on the Vent , increased hypoxia. CXR showed increased opacities  Increase in CHF  Lung sounds coarse  . fall on 05-01-21    TECHNIQUE: CTA imaging of the chest with intravenous contrast.  3D reconstructions.  CONTRAST:  100 ml's of Isovue 370    COMPARISON: 05/03/2021  # of known CTs in the past 12 months: 7   # of known Cardiac Nuclear Medicine Studies in the past 12 months: 0         FINDINGS:  Hardware:  There is an NG tube in the stomach. An ET tube is present in the trachea. There is a left-sided PICC line in the SVC.    Lymph nodes:   No mediastinal, hilar, or axillary lymphadenopathy.    Heart:  Coronary artery calcifications are noted.        RV/LV Diameter Ratio: N/A    Thoracic Aorta:  No thoracic aortic aneurysm or dissection.    Pulmonary Vessels:  No evidence of acute pulmonary emboli through the major subsegmental branches.    Lungs and Airways:  There is patient respiratory motion artifact which  limits evaluation of the lungs.      Bilateral lower lobe airspace disease with air bronchograms. Pneumonia versus atelectasis.      Pleura: Bilateral pleural effusions left larger than right    Upper Abdomen: Cirrhosis with splenomegaly. Small volume of ascites. Probable cholelithiasis.    Bones: Bone windows are unremarkable.        Impression    1.NO PULMONARY EMBOLUS  2.BILATERAL PLEURAL EFFUSIONS  3.BILATERAL LOWER LOBE AIRSPACE DISEASE. PNEUMONIA VERSUS ATELECTASIS      One or more dose reduction techniques were used (e.g., Automated exposure control, adjustment of the mA and/or kV according  to patient size, use of iterative reconstruction technique).      Radiologist location ID: [id number]     XR AP MOBILE CHEST     Status: None    Narrative    Jaxson LEE Bussiere    RADIOLOGIST: Colletta Maryland, MD    XR AP MOBILE CHEST performed on 05/11/2021 5:35 AM    CLINICAL HISTORY: respiratory failure.  resp fail-vent    TECHNIQUE: Frontal view of the chest.    COMPARISON:  Yesterday    FINDINGS:  The support lines and tubes are in stable and satisfactory position.   Cardiac and mediastinal contours are stable.   Bilateral airspace disease with bilateral pleural effusions similar to yesterday           Impression    NO ACUTE FINDINGS.      Radiologist location ID: [id number]     CT BRAIN WO IV CONTRAST     Status: None    Narrative    Drakkar LEE Iorio    RADIOLOGIST: Colletta Maryland, MD    CT BRAIN WO IV CONTRAST performed on 05/12/2021 2:27 AM    CLINICAL HISTORY: altered mental status.  Pt intubated and per RN very restless this early AM    TECHNIQUE:  Head CT without intravenous contrast.    COMPARISON: 05/01/2021  # of known CTs in the past 12 months: 8   # of known Cardiac Nuclear Medicine Studies in the past 12 months: 0    FINDINGS: This study is degraded by patient motion artifact  There is no acute intracranial hemorrhage, mass effect, or evidence of large acute infarct.    Brain: Low density in the periventricular white matter suggests mild chronic small vessel ischemic changes.    CSF Spaces: Mild generalized cerebral atrophy     Sinuses/Mastoids:  Clear at visualized levels     Bones: Unremarkable        Impression    CHRONIC CHANGES.  NO ACUTE FINDINGS.       One or more dose reduction techniques were used (e.g., Automated exposure control, adjustment of the mA and/or kV according to patient size, use of iterative reconstruction technique).      Radiologist location ID: [id number]     XR AP MOBILE CHEST     Status: None    Narrative    Brodin LEE Kochan    RADIOLOGIST: Colletta Maryland, MD    XR AP MOBILE  CHEST performed on 05/12/2021 6:20 AM    CLINICAL HISTORY: respiratory failure.  vent management     TECHNIQUE: Frontal view of the chest.    COMPARISON:  Yesterday    FINDINGS:  The support lines and tubes are in stable and satisfactory position.   Cardiac and mediastinal contours are stable.   There is a left pleural effusion and bilateral airspace disease with possible  right pleural effusion. Mild improvement in the right lung since yesterday.           Impression    BILATERAL AIRSPACE DISEASE AND PLEURAL EFFUSIONS LEFT WORSE      Radiologist location ID: [id number]     XR AP MOBILE CHEST     Status: None    Narrative    Josimar LEE Geer    RADIOLOGIST: Colletta Maryland, MD    XR AP MOBILE CHEST performed on 05/13/2021 8:23 AM    CLINICAL HISTORY: respiratory failure, PNA.  resp.failure, PNA, vent    TECHNIQUE: Frontal view of the chest.    COMPARISON:  Yesterday    FINDINGS:  The support lines and tubes are in stable and satisfactory position.   Cardiac and mediastinal contours are stable.   There is a left pleural effusion unchanged from yesterday. The right lung is improved since yesterday with improved lung volumes.           Impression    PERSISTENT LEFT PLEURAL EFFUSION      Radiologist location ID: [id number]     XR AP MOBILE CHEST     Status: None    Narrative    Rolondo LEE Hutchins    RADIOLOGIST: Berton Bon, MD    XR AP MOBILE CHEST performed on 05/14/2021 6:07 AM    CLINICAL HISTORY: Pleural effusions.  vent management. pleural effusion    TECHNIQUE: Frontal view of the chest.    COMPARISON:  Yesterday    FINDINGS:  ET tube 4.8 cm above the carina.. There is an NG tube extending at least into the distal esophagus and obscured over the upper abdomen. There is a left PICC catheter in the SVC   Heart size is moderately enlarged.     There are perihilar infiltrates with possible effusions obscuring the diaphragms especially on the left           Impression    Stable infiltrates/vascular congestion with probable  effusions      Radiologist location ID: [id number]     XR AP MOBILE CHEST     Status: None    Narrative    Myrick LEE Scollard    RADIOLOGIST: Brayton El, MD    XR AP MOBILE CHEST performed on 05/15/2021 6:04 AM    CLINICAL HISTORY: Pleural effusions.      vent management     TECHNIQUE: Frontal view of the chest.    COMPARISON:  05/13/2021    FINDINGS:  A left PICC ends in the upper SVC. The endotracheal tube ends 4 cm above the carina. The enteric tube ends below the diaphragm.     The heart size is normal.     There is improving aeration of both lungs most notably at both lung bases. A trace left pleural effusion persists.         Impression    Improving aeration of both lungs. The left pleural effusion appears to have decreased in size.       Radiologist location ID: [id number]     XR ABD SUPINE     Status: None    Narrative    Jonus LEE Westenberger    RADIOLOGIST: Brayton El, MD    XR ABD SUPINE performed on 05/15/2021 1:02 PM.    CLINICAL HISTORY: r/o ileus - abdominal distention.      ileus    TECHNIQUE: Single view abdomen.    COMPARISON:  None.      FINDINGS:  There is a diffuse gaseous distention of the small bowel. There is also gas within the cecum suggesting this could be an ileus.     No suspicious calcifications.    The bones are unremarkable.        Impression    Diffuse gaseous distention in a pattern suggestive of ileus. The enteric tube ends in the stomach.        Radiologist location ID: [id number]     MRI BRAIN W/WO CONTRAST     Status: None    Narrative    Pernell LEE Ridings    RADIOLOGIST: Betsey Amen, MD    MRI BRAIN W/WO CONTRAST performed on 05/15/2021 10:34 PM    CLINICAL HISTORY: inconsistant neuro exam.  Trauma, splenic laceration.  Unable to wake pt calmy since sedated.    TECHNIQUE: Routine brain MRI without and with intravenous contrast.   INTRAVENOUS CONTRAST: 20 ml's of Dotarem    COMPARISON:  None.    FINDINGS:  Brain: Normal signal intensities.    Diffusion  weighted images show no evidence of acute or recent infarct.   Postcontrast images show no suspicious enhancement.    Ventricles: Normal.    Major Intracranial Vessels: Normal flow voids.    Sinuses: Clear.  Mastoids: Small amount of fluid signal is identified within both mastoid air cells.          Impression    No acute finding.        Radiologist location ID: [id number]     XR AP MOBILE CHEST     Status: None    Narrative    Mirko LEE Vida    RADIOLOGIST: Berton Bon, MD    XR AP MOBILE CHEST performed on 05/16/2021 6:55 AM    CLINICAL HISTORY: Pleural effusions.  RESP FAIL-VENT    TECHNIQUE: Frontal view of the chest.    COMPARISON:  Yesterday    FINDINGS:  ET tube 4.7 cm above the carina. There is an NG tube extending towards the stomach. The tip is not included on this film. There is a left PICC catheter in the SVC   Heart size is moderately enlarged.     There are bilateral perihilar and infrahilar infiltrates with obscuration of the left diaphragm           Impression    Slightly increased infiltrates/vascular congestion      Radiologist location ID: [id number]     XR KUB     Status: None    Narrative    Rick LEE Viruet    RADIOLOGIST: Berton Bon, MD    XR KUB performed on 05/16/2021 9:31 AM.    CLINICAL HISTORY: ileus.  evaluate ileus - abdominal distention    TECHNIQUE: Single view abdomen.    COMPARISON:  Yesterday    FINDINGS:  There is gaseous gastric distention. There is some mild diffuse gaseous small bowel distention. Some air is also suggested in the right colon    No suspicious calcifications.    There is lumbar spurring  There is an NG tube with the tip of the proximal stomach         Impression    1.Persistent gaseous intestinal distention suggesting an ileus. There is increased gastric distention since yesterday        Radiologist location ID: [id number]     [place] CHEST (EFFUSION)     Status: None    Narrative    Jamesen LEE Chery    RADIOLOGIST: Tempie Donning  [place] CHEST (EFFUSION) performed on  05/16/2021 2:28 PM    CLINICAL HISTORY: evaluate for thoracentesis.  bilateral chest     COMPARISON: None.    FINDINGS:  Ultrasound was used to evaluate for pleural effusions.    There is a small amount of perihepatic ascites. There is a trace right effusion. No left pleural fluid is seen.       Impression    As above.        Radiologist location ID: [id number]     XR AP MOBILE CHEST     Status: None    Narrative    Brady LEE Allocca    RADIOLOGIST: Danne Baxter, MD    XR AP MOBILE CHEST performed on 05/17/2021 6:07 AM    CLINICAL HISTORY: respiratory failure.    chest trauma.   fractured ribs.  RESP FAIL-VENT    TECHNIQUE: Frontal view of the chest.    COMPARISON:  Yesterday    FINDINGS:  The support lines and tubes are in stable and satisfactory position.   The heart size is normal.   There is some stable mild opacity at both lung bases.   There is a stable small left pleural effusion.        Impression    NO SIGNIFICANT CHANGE SINCE THE PRIOR EXAM.      Radiologist location ID: [id number]     XR KUB     Status: None    Narrative    Jermone LEE Smalling    RADIOLOGIST: Danne Baxter, MD    XR KUB performed on 05/17/2021 8:25 AM.    CLINICAL HISTORY: ileus.  evaluate ileus    TECHNIQUE: Single view abdomen.    COMPARISON:  05/16/2021    FINDINGS:  There are markedly distended loops of small bowel with a paucity of air in colon. Findings indicate a small bowel obstruction. Gaseous distention of the stomach has decreased from the prior exam.    A nasogastric tube is present in the region of the stomach.    There are degenerative changes of the spine.        Impression    PERSISTENT MARKEDLY DISTENDED LOOPS OF SMALL BOWEL SUGGESTING AN OBSTRUCTION.        Radiologist location ID: [id number]     XR AP MOBILE CHEST     Status: None    Narrative    Rashee LEE Flansburg    RADIOLOGIST: Ileene Hutchinson    XR AP MOBILE CHEST performed on 05/18/2021 6:50 AM    CLINICAL HISTORY: Chest pain/SOB, Left pleural effusion.  resp  fail-vent    TECHNIQUE: Frontal view of the chest.    COMPARISON:  Yesterday    FINDINGS:  The support lines and tubes are in stable and satisfactory position.   The heart size is normal.   Bibasilar opacities and a small left pleural effusion are unchanged.   The bones are unremarkable.        Impression    NO SIGNIFICANT CHANGE SINCE THE PRIOR EXAM.      Radiologist location ID: [id number]     XR KUB     Status: None    Narrative    Azaiah LEE Betke    RADIOLOGIST: Brayton El, MD    XR KUB performed on 05/18/2021 10:21 AM.    CLINICAL HISTORY: f/u ileus.      ileus/vent    TECHNIQUE: Single view abdomen.    COMPARISON:  05/17/2021  FINDINGS:  There is diffuse gaseous distention of the small bowel and colon potentially representing ileus. An enteric tube is seen in the stomach.    There is a Foley catheter.    No suspicious calcifications.    Bibasilar atelectasis is present. Mild cardiomegaly.           Impression    Diffuse gaseous distention of the bowel suggestive of an ileus.        Radiologist location ID: [id number]     XR KUB     Status: None    Narrative    Josuha LEE Lucero    RADIOLOGIST: Danne Baxter, MD    XR KUB performed on 05/19/2021 6:19 AM.    CLINICAL HISTORY: f/u ileus.  re evaluate ileus     TECHNIQUE: Single view abdomen.    COMPARISON:  Yesterday    FINDINGS:  There is a nasogastric tube projecting in the region of the distal body of the stomach.    There are distended gas-filled loops of small bowel with a similar degree of distention. Obstruction is suspected. Ileus is in differential.    There are degenerative changes of the spine.        Impression    NO SIGNIFICANT CHANGE FROM THE PRIOR STUDY.        Radiologist location ID: [id number]     XR AP MOBILE CHEST     Status: None    Narrative    Macalister LEE Felker    RADIOLOGIST: Danne Baxter, MD    XR AP MOBILE CHEST performed on 05/19/2021 6:20 AM    CLINICAL HISTORY: Chest pain/SOB, Left pleural effusion.  on vent      TECHNIQUE: Frontal view of the chest.    COMPARISON:  Yesterday    FINDINGS:  The support lines and tubes are in stable and satisfactory position.   The heart size is normal.   There is patchy by airspace disease with a layering left pleural effusion. Aeration is similar.           Impression    NO SIGNIFICANT CHANGE SINCE THE PRIOR EXAM.      Radiologist location ID: [id number]     XR AP MOBILE CHEST     Status: None    Narrative    Chevis LEE Stupka    RADIOLOGIST: Kelby Frame    XR AP MOBILE CHEST performed on 05/20/2021 6:09 AM    CLINICAL HISTORY: Chest pain/SOB, Left pleural effusion.  re eval effusions     TECHNIQUE: Frontal view of the chest.    COMPARISON:  Chest x-ray from one day ago    FINDINGS:  Endotracheal tube is no longer seen. NG tube tip is below the diaphragm in the stomach. PICC line tip at the distal SVC is stable   Cardiac and mediastinal contours are stable.   Patchy infiltrative bilateral changes similar to the prior exam given difference in technique. Hazy appearance left lower chest likely indicating effusion.  Left rib fractures are seen. No obvious pneumothorax           Impression    NO SIGNIFICANT CHANGE SINCE THE PRIOR EXAM. (Extubated)      Radiologist location ID: [id number]     XR AP MOBILE CHEST     Status: None    Narrative    Horst LEE Siedlecki    RADIOLOGIST: Dorisann Frames, MD    XR AP MOBILE CHEST performed on 05/21/2021 12:38 PM  CLINICAL HISTORY: dobhoff tube placement.  dobhoff tube placement    TECHNIQUE: Frontal view of the chest.    COMPARISON:  Yesterday    FINDINGS:  Large body habitus degrades image quality.  The Dobbhoff tube can be seen to just below the level of the GE junction. A high KUB may be helpful for further clarifying the position of the tip.  Left PICC is noted.   Cardiac and mediastinal contours are stable.   No significant change in the appearance of the lungs.   Patchy and confluent bilateral pulmonary infiltrates persist with dense  consolidation/atelectasis at the left lung base.        Impression    DOBBHOFF TUBE IS DIFFICULT TO SEE THE LOWER MARGIN OF THE IMAGE. A HIGH KUB IMAGE MAY BE HELPFUL FOR FURTHER EVALUATION.      Radiologist location ID: [id number]     XR KUB     Status: None    Narrative    Chiron LEE Osowski    RADIOLOGIST: Otho Najjar, MD    XR KUB performed on 05/21/2021 2:11 PM.    CLINICAL HISTORY: dobhoff placement.  for dobhoff tube placement.    TECHNIQUE: Single view abdomen.    COMPARISON:  None.    FINDINGS:  NG tube placement demonstrates Dobbhoff tube to be overlying the stomach appearing to be anatomically positioned.    Gas pattern is nonspecific with a single loop of what appears to be small bowel in the left mid abdomen noted.  No gross calcifications.      Impression    Anatomic positioning of NG tube.      Radiologist location ID: [id number]     FLUORO ESOPHAGRAM, MODIFIED SWALLOW     Status: None    Narrative    Avett LEE Schmieg    RADIOLOGIST: Otho Najjar, MD    FLUORO ESOPHAGRAM, MODIFIED SWALLOW performed on 05/22/2021 1:57 PM    CLINICAL HISTORY: dysphagia, cough post prolonged intubation. . .  dysphagia w/cough    TECHNIQUE:  Modified barium swallow was performed in conjunction with a member of the speech pathology department using various consistencies of barium and lateral fluoroscopic observation.     FLUOROSCOPIC TIME:  1.1 minutes  FLUOROGRAPHIC IMAGES: 13    FINDINGS:   The patient was given thin barium, nectar thick barium, applesauce, fruit and wafers mixed with barium.       Impression    Impression:  Thin barium:  Spoon: No penetration or aspiration.  Cup: No penetration or aspiration.  Straw: Penetration.    Nectar thick barium:  Spoon: Penetration area  Cup: No penetration or aspiration.  Straw: No penetration or aspiration.    Applesauce: No aspiration or penetration.  Fruit: After liquid barium squeezed out from the fruit, slight penetration.  Wafers mixed with barium: After liquid  barium squeezed out, slight penetration.    PLEASE REFER TO SPEECH PATHOLOGIST'S REPORT FOR ADDITIONAL DETAILS.      Radiologist location ID: [id number]        All current radiology studies were reviewed.    PHYSICAL EXAMINATION  Temperature: 36.8 C (98.3 F)  Heart Rate: 82  BP (Non-Invasive): 130/64  Respiratory Rate: 18  SpO2: 91 %    General:  No obvious discomfort.  No signs of respiratory distress.  05/24/2021- Patient is seen and examined , feeling much better, not in restraints, overall improved, lab work is stable, transferred to the step down on hospitalist service now ,  6/11-Patient is seen and examined , complaining of insomnia and unable to sleep at all, with lingering symptoms during the night ,   -6/12- patient is seen and examined.  Patient is lying in bed he is more alert oriented and caring conversation.  At this time patient is starting to eat but advancement is very slow.  His vitals are stable.  HEENT patient is very unstable on his feet the time.  Working with physical therapy.    05/27/2021- patient is seen and examined.  He is sitting in bed his wife is at the bedside.  Patient is more alert and actually had more of the conversation with even joking.  He tried to get up today and did not have a proper slippers and fell down near the bed but he did not his head he had or any other parts of his body he slid very slow.  At this time patient is going to try to work with physical therapy while there in the room but states that he is hungry for regular food and that to be advanced.  Speech therapy is coming to re-evaluate him tomorrow because today his evaluation ill residual weakness.    05/28/2021- patient is seen and examined today.  Patient states that he is feeling well and he wants to eat whole diet.  However speech therapy is still evaluating him and making sure the he is able to.  He is status post ICU with a prolonged intubation.  At this time patient refusing to go to any skilled  nursing facility and apparently go friend is okay with him coming to showed.  He will need to be evaluated by the speech prior to going anywhere at this point he is on specific thickened liquids which he is not planning to continue even if he has recommended that.    HEENT:  Normocephalic.  Orogastric tube in place.  Sclera are nonicteric.    Neck:  No obvious JVD.    Heart:  S1, S2.  No murmurs    Lungs:  Breath sounds are clear bilaterally;  slightly decreased at the bases left greater than right.    Abdomen:  Abdomen is still distended but not as firm as it was.  There are some bowel sounds present.  He still receiving the lactulose enemas.    Extremities:  No significant edema.  No cyanosis or clubbing    Neuro:  Nonfocal.  Responding to questions appropriately.      Skin - warm, no jaundice     Orogasttric Tube:  OROGASTRIC     Tube Feedings:  ON HOLD.     Psych- normal base line mood ,     PICC LINE:  LEFT ARM    INTRAVENOUS INFUSIONS: 1.  PRECEDEX AND KETAMINE DISCONTINUED.    FOLEY CATHETER:  IN PLACE.   LENGTH OF STAY INPUT AND OUTPUT SHOW A NET NEGATIVE                                                            OF  - 9.8 L.    PROBLEM LIST:   Active Hospital Problems   (*Primary Problem)    Diagnosis   . *Spleen injury   . Status post thoracentesis   . Ileus (CMS HCC)   .  Leukopenia   . Encephalopathy   . Serum ammonia increased (CMS [facility])   . Pleural effusion   . On mechanically assisted ventilation (CMS HCC)   . Multiple rib fractures   . Left pulmonary contusion   . Closed fracture of transverse process of thoracic vertebra (CMS HCC)   . 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 Thrombocytopenia (CMS HCC)   . Alcohol abuse     -  EXTUBATED ON 05/19/2021    PLAN:  1.  Continue supplemental O2 as needed. IS and pulmonary tolieting  2.  Speech evaluated   3.  Continue lactulose enemas.  4.  GI prophylaxis and DVT prophylaxis.  5.  Chest x-ray, CBC and chemistry in the morning.  6.  Continue bowel  regimen.  7.  Continue IV fluid.  8.  Continue bowel regimen and also continue Reglan.  9.  PT/OT  -6/12- patient is seen and examined.  Patient is lying in bed he is more alert oriented and caring conversation.  At this time patient is starting to eat but advancement is very slow.  His vitals are stable.  HEENT patient is very unstable on his feet the time.  Working with physical therapy.  -05/27/2021- patient is seen and examined.  He is sitting in bed his wife is at the bedside.  Patient is more alert and actually had more of the conversation with even joking.  He tried to get up today and did not have a proper slippers and fell down near the bed but he did not his head he had or any other parts of his body he slid very slow.  At this time patient is going to try to work with physical therapy while there in the room but states that he is hungry for regular food and that to be advanced.  Speech therapy is coming to re-evaluate him tomorrow because today his evaluation ill residual weakness.  -05/28/2021- patient is seen and examined today.  Patient states that he is feeling well and he wants to eat whole diet.  However speech therapy is still evaluating him and making sure the he is able to.  He is status post ICU with a prolonged intubation.  At this time patient refusing to go to any skilled nursing facility and apparently go friend is okay with him coming to showed.  He will need to be evaluated by the speech prior to going anywhere at this point he is on specific thickened liquids which he is not planning to continue even if he has recommended that.      Natalia Zavodchikov, DO

## 2021-05-28 NOTE — Care Management Notes (Signed)
SS f/u with patient regarding a DC plan.  Patient refuses any type of placement and said he is going home.  He is agreeable to home health.  SS spoke with patient's HCS who said he is too stubborn to agree to go anywhere and he can just go home with home health.  SS notified the CM about HH.  The HCS will transport him at DC.  SS will continue to follow in case patient is agreeable to placement.

## 2021-05-28 NOTE — Care Plan (Signed)
Pt remains on [facility]SD. Ambulated with PT today. Pt is requesting discharge to home instead of skilled care.

## 2021-05-28 NOTE — PT Treatment (Signed)
[facility]  [place], [place] [address]  [phone]         Physical Therapy Acute Care Daily Treatment Record       Date: 05/28/2021  Time: 08:10  Patient's Name: Hector [name]  Date of Birth: September 27, 1961  Room Number: 653/A    Pertinent Information since last Physical Therapy visit :    Patient is more alert but still demonstrates confusion.      Precautions / Limitations :    Fall precautions     Weight Bearing status, if applicable :    No Weightbearing restrictions     Subjective :   Patient tends to be impulsive with transfers and requires cues for safety.     Pain Rating :   Pre-Treatment Pain: 0  Post-Treatment Pain: 0  Comments:                                  Orientation & Level of Orientation :    Alert, Cooperative and Confused      6 Clicks Score :    1.) Turning from back to side while flat in bed without using a bed rail.   A little assistance (3)  2.) Moving from lying on back to sitting on the side of a flat bed without using bed rails.  A little assistance (3)  3.) Moving to and from a bed to a chair, including a wheelchair.   A little assistance (3)  4.) Standing up for chair using your arms (eg, wheelchair or bedside chair).  A little assistance (3)  5.) Walking in hospital room.  A little assistance (3)  6.) Climbing 3-5 steps with a railing.   A lot of assistance (2)    6 Clicks Plan: Score <34 - PT consult is appropriate; placement may be necessary at discharge      Treatment :      Transfers:  Rolling in bed:    Minimal Assist  with assist of 1                                                Supine to sit:   Minimal Assist   with assist of 1                                Sit to stand:  Minimal Assist   with assist of 2                                                                Gait:     Distance:    Less than 10' or 20' to 40'              Device Utilized:   Rollator Walker          Level of Assist: Minimal  Assist  with assist of 2      Weight-Bearing Status:   No Weightbearing restrictions                        Gait Deviations: Step length decreased and Balance impaired     Sitting Balance: Fair +                           Standing Balance:  Fair                   Stairs:                            Therapeutic Exercise:                  Therapeutic Activity:             Canalith Repositioning:             Other Treatment Interventions:       Daily Assessment of Status :    Patient performed supine to sit transfer x min A x 1 and sit to stand x min A x 1. Patient ambulated 25 feet with rollator x min A x 2. Patient demonstrates decreased balance and endurance of gait.     Timed Charge Number of Minutes   Neuromuscular Re-Education     Gait Training  11   Therapeutic Activity     Therapeutic Exercise      Canalith Re-positioning   Non-timed charge    Other     Total Treatment Time  11     Patient Education :          Educated patient on placement of upper and lower extremities for sit to stand transfer.     Interdisciplinary Communication :    At end of session, patent was left up in bedside chair. Chair alarm was activated and all needs were within reach. Chair in locked postion. Nursing notified.            Discharge Planning :   Skilled Care    Plan :   Continue Physical Therapy program     Nada Libman, [place]  05/28/2021, 12:56

## 2021-05-29 DIAGNOSIS — F17200 Nicotine dependence, unspecified, uncomplicated: Secondary | ICD-10-CM | POA: Insufficient documentation

## 2021-05-29 DIAGNOSIS — S2249XA Multiple fractures of ribs, unspecified side, initial encounter for closed fracture: Secondary | ICD-10-CM

## 2021-05-29 MED ORDER — SERTRALINE 100 MG TABLET
200.0000 mg | ORAL_TABLET | Freq: Every day | ORAL | 0 refills | Status: DC
Start: 2021-05-29 — End: 2024-01-09

## 2021-05-29 MED ORDER — NICOTINE 21 MG/24 HR DAILY TRANSDERMAL PATCH
21.0000 mg | MEDICATED_PATCH | Freq: Every day | TRANSDERMAL | Status: DC
Start: 2021-05-29 — End: 2021-05-30

## 2021-05-29 MED ORDER — PANTOPRAZOLE 40 MG TABLET,DELAYED RELEASE
40.0000 mg | DELAYED_RELEASE_TABLET | Freq: Every day | ORAL | 0 refills | Status: AC
Start: 2021-05-29 — End: 2021-06-28

## 2021-05-29 MED ORDER — MELATONIN 3 MG TABLET
6.0000 mg | ORAL_TABLET | Freq: Every evening | ORAL | 0 refills | Status: AC
Start: 2021-05-29 — End: 2021-06-28

## 2021-05-29 MED ORDER — POTASSIUM CHLORIDE ER 20 MEQ TABLET,EXTENDED RELEASE(PART/CRYST)
10.0000 meq | ORAL_TABLET | Freq: Every day | ORAL | 0 refills | Status: AC
Start: 2021-05-29 — End: 2021-06-28

## 2021-05-29 MED ORDER — RIFAXIMIN 550 MG TABLET
550.0000 mg | ORAL_TABLET | Freq: Two times a day (BID) | ORAL | 0 refills | Status: AC
Start: 2021-05-29 — End: 2021-06-28

## 2021-05-29 MED ORDER — NICOTINE 21 MG/24 HR DAILY TRANSDERMAL PATCH
21.0000 mg | MEDICATED_PATCH | Freq: Every day | TRANSDERMAL | 0 refills | Status: DC
Start: 2021-05-29 — End: 2021-06-18

## 2021-05-29 MED ORDER — LACTULOSE 20 GRAM/30 ML ORAL SOLUTION
30.0000 mL | Freq: Two times a day (BID) | ORAL | 0 refills | Status: AC
Start: 2021-05-29 — End: 2021-06-12

## 2021-05-29 MED ORDER — LIDOCAINE 5 % TOPICAL PATCH
1.0000 | MEDICATED_PATCH | Freq: Every day | CUTANEOUS | 0 refills | Status: AC
Start: 2021-05-30 — End: 2021-06-29

## 2021-05-29 NOTE — Speech Therapy (Signed)
[facility]  Inpatient Speech Therapy  [place]  [place], [place] [address]  Phone: [phone]  Fax: [phone]    Inpatient Dysphagia Treatment       Patient subjective: Pt seen this PM for swallowing tx following clearance from RN. Pt awake and alert sitting upright in bed upon SLP arrival, wife present at bedside. Pt was agreeable to therapeutic swallow trials at this time. Pt reported he needed his diet changed as this was the only reason he has not been discharged yet. This SLP went over the results of the MBS with him and discussed safety w/ PO intake.   Current diet consistency at time of treatment: Puree and Nectar Thick/Mildly Thick/ Level 2  Recent CXR:   Recent Results (from the past 720 hour(s))   XR AP MOBILE CHEST    Collection Time: 05/21/21 12:38 PM    Narrative    Coralyn Mark LEE [place]    RADIOLOGIST: Dorisann Frames, MD    XR AP MOBILE CHEST performed on 05/21/2021 12:38 PM    CLINICAL HISTORY: dobhoff tube placement.  dobhoff tube placement    TECHNIQUE: Frontal view of the chest.    COMPARISON:  Yesterday    FINDINGS:  Large body habitus degrades image quality.  The Dobbhoff tube can be seen to just below the level of the GE junction. A high KUB may be helpful for further clarifying the position of the tip.  Left PICC is noted.   Cardiac and mediastinal contours are stable.   No significant change in the appearance of the lungs.   Patchy and confluent bilateral pulmonary infiltrates persist with dense consolidation/atelectasis at the left lung base.        Impression    DOBBHOFF TUBE IS DIFFICULT TO SEE THE LOWER MARGIN OF THE IMAGE. A HIGH KUB IMAGE MAY BE HELPFUL FOR FURTHER EVALUATION.      Radiologist location ID: [id number]       Oxygenation: Room air  Dentition: Natural Dentition  Mucosa Status: Clean/Moist   Oral Care: NA/Not completed   Feeding ability during treatment: Set-up required  Position during treatment: Fully upright in bed  Mental Status:  Alert, Responsive and Cooperative    Therapeutic Trials/Feeds     Liquid Consistency : Thin/Level 0  Route/Volume of Presentation :Successive cup sips x5  Oral Phase: WFL, no significant oral dysphagia noted at bedside with this consistency   Pharyngeal Phase: Throat Clearing and Coughing  Pt was impulsive w/ thin liquids - he took very large drinks which was possibly the reason for coughing and throat clearing.     Liquid Consistency : Nectar thick/Mildly Thick/Level 2  Route/Volume of Presentation :Successive cup sips x3  Oral Phase: WFL, no significant oral dysphagia noted at bedside with this consistency   Pharyngeal Phase: No overt s/s of aspiration observed at bedside during subjective assessment      Solid Consistency: mechanical soft  Route/Volume of Presentation: Large bites x 4oz container  Oral Phase: WFL, no significant oral dysphagia noted at bedside with this consistency   Pharyngeal Phase: Throat clear x1, no other overt s/s of aspiration noted at bedside.   Pt would benefit from consistent cueing to utilize slow rate and small bites to reduce the risk of aspiration at bedside.     Treatment Summary     Good treatment tolerance, Appropriate for diet upgrade and Progressing towards goals/qualitative gains in function       Plan  Diet Recommendations: Dysphagia Mechanical Soft and Nectar Thick/Mildly Thick/ Level 2  Aspiration Precautions/Compensatory Strategies: Elevate HOB to a 90 degree seated position, No straws, Single sips, Small bites, Encourage chewing, Swallow each bite prior to taking next bite and slow rate  Medication administration: Pills whole in applesauce/pudding and Pills crushed in applesauce/pudding (if crushable)  Feeding Assistance: Assistance w/ tray set up and/or positioning  General plan of care: Therapeutic trials for possible advancement , Aspiration precautions and Patient/family education/training  Recommended Diagnostics: N/A  Results & Recommendations/ Education provided  to: Patient, Spouse and Nurse regarding Diet upgrade and Aspiration risks and precautions  Is continued treatment indicated? Yes; continue current plan of care    Therapist:   Myrla Halsted, SLP      Dysphagia Treatment Time: 19 minutes

## 2021-05-29 NOTE — Nurses Notes (Signed)
OK for discharge to home with [facility] PT and nursing. IVL and tele removed. Patient educated on STOP SMOKING. O2 ordered per RT for low saturations at rest and with activity.

## 2021-05-29 NOTE — Discharge Summary (Signed)
[facility]  DISCHARGE SUMMARY    PATIENT NAME:  Hector [name], Hector [name]  MRN:  [id number]  DOB:  October 29, 1961    ENCOUNTER DATE:  05/01/2021  INPATIENT ADMISSION DATE: 05/01/2021  DISCHARGE DATE:  05/29/2021    ATTENDING PHYSICIAN: Leonarda Salon, DO  SERVICE: [facility] HOSPITALIST  PRIMARY CARE PHYSICIAN: No Pcp         LAY CAREGIVER:  ,  ,        PRIMARY DISCHARGE DIAGNOSIS: Spleen injury  Active Hospital Problems    Diagnosis Date Noted   . Principle Problem: Spleen injury [S36.00XA] 05/01/2021   . Tobacco use disorder [F17.200] 05/29/2021   . Status post thoracentesis [Z98.890] 05/19/2021   . Ileus (CMS HCC) [K56.7] 05/18/2021   . Leukopenia [D72.819] 05/18/2021   . Encephalopathy [G93.40] 05/15/2021   . Serum ammonia increased (CMS [place]) [E72.20] 05/15/2021   . Pleural effusion [J90] 05/11/2021   . On mechanically assisted ventilation (CMS HCC) [Z99.11] 05/03/2021   . Multiple rib fractures [S22.49XA] 05/01/2021   . Left pulmonary contusion [S27.321A] 05/01/2021   . Closed fracture of transverse process of thoracic vertebra (CMS [place]) [S22.009A] 05/01/2021   . 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 Thrombocytopenia (CMS [place]) [D69.6] 05/01/2021   . Alcohol abuse [F10.10] 05/01/2021      Resolved Hospital Problems    Diagnosis    . Hemopneumothorax on left [J94.2]      There are no active non-hospital problems to display for this patient.       DISCHARGE MEDICATIONS:     Current Discharge Medication List      START taking these medications.      Details   lactulose 20 gram/30 mL Solution  Commonly known as: ENULOSE   30 mL, Oral, 2 TIMES DAILY  Qty: 840 mL  Refills: 0     lidocaine 5 % Adhesive Patch, Medicated  Commonly known as: LIDODERM  Start taking on: May 30, 2021   700 mg, Transdermal, DAILY  Qty: 30 Patch  Refills: 0     melatonin 3 mg Tablet   6 mg, Oral, NIGHTLY  Qty: 60 Tablet  Refills: 0     nicotine 21 mg/24 hr Patch 24 hr  Commonly known as: NICODERM CQ   21 mg, Transdermal,  DAILY  Qty: 30 Patch  Refills: 0     pantoprazole 40 mg Tablet, Delayed Release (E.C.)  Commonly known as: PROTONIX   40 mg, Oral, DAILY  Qty: 30 Tablet  Refills: 0     potassium chloride 20 mEq Tab Sust.Rel. Particle/Crystal  Commonly known as: K-DUR   10 mEq, Oral, DAILY  Qty: 15 Tablet  Refills: 0     rifAXIMin 550 mg Tablet  Commonly known as: XIFAXAN   550 mg, Oral, EVERY 12 HOURS  Qty: 60 Tablet  Refills: 0        CONTINUE these medications which have CHANGED during your visit.      Details   sertraline 100 mg Tablet  Commonly known as: ZOLOFT  What changed: when to take this   200 mg, Oral, DAILY  Qty: 30 Tablet  Refills: 0        CONTINUE these medications - NO CHANGES were made during your visit.      Details   OLANZapine 10 mg Tablet  Commonly known as: ZYPREXA   20 mg, Oral, NIGHTLY  Refills: 0     traZODone 50 mg Tablet  Commonly known as:  DESYREL   50 mg, Oral, NIGHTLY, Unsure dosage  Refills: 0          Discharge med list refreshed?  YES                     ALLERGIES:  Allergies   Allergen Reactions   . Bee Venom Protein (Honey Bee)      Bee stings             HOSPITAL PROCEDURE(S):   Bedside Procedures:  Orders Placed This Encounter   Procedures   . BEDSIDE  MISC PROCEDURE   . BEDSIDE  BRONCHOSCOPY   . BEDSIDE  MISC PROCEDURE     Surgical       REASON FOR HOSPITALIZATION AND HOSPITAL COURSE     BRIEF HPI:  This is a 60 y.o., male admitted for respiratory failure     BRIEF HOSPITAL NARRATIVE:      PROBLEM LIST:       [facility] Hospital Problems   (*Primary Problem)    Diagnosis   . *Spleen injury   . Status post thoracentesis   . Ileus (CMS HCC)   . Leukopenia   . Encephalopathy   . Serum ammonia increased (CMS [place])   . Pleural effusion   . On mechanically assisted ventilation (CMS HCC)   . Multiple rib fractures   . Left pulmonary contusion   . Closed fracture of transverse process of thoracic vertebra (CMS HCC)   . 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 Thrombocytopenia (CMS HCC)   . Alcohol  abuse     -  EXTUBATED ON 05/19/2021    1.  Continue supplemental O2 as needed.patient was discharged on home oxygen   2.  Speech evaluated - thickened diet   3.  Continue lactulose  At home   6.  Continue bowel regimen.          CONDITION ON DISCHARGE:  A. Ambulation: Full ambulation  B. Self-care Ability: Complete  C. Cognitive Status Oriented x 3  D. Code status at discharge:             LINES/DRAINS/WOUNDS AT DISCHARGE:   Patient Lines/Drains/Airways Status     Active Line / Dialysis Catheter / Dialysis Graft / Drain / Airway / Wound     Name Placement date Placement time Site Days    Peripheral IV Right Cephalic  (lateral side of arm) 05/29/21  0843  -- less than 1    Foley Catheter 05/11/21  2100  -- 17                DISCHARGE DISPOSITION:  Home discharge with Home health     Discharge time 40 min               DISCHARGE INSTRUCTIONS:   Follow-up Information     Pcp, No In 3 days.                        DISCHARGE INSTRUCTION - DIET    With thickened nectar     Diet: SOFT      DISCHARGE INSTRUCTION - ACTIVITY     Activity: AS TOLERATED                 Natalia Zavodchikov, DO    Copies sent to Care Team       Relationship Specialty Notifications Start End    Pcp, No PCP - General  05/01/21             Referring providers can utilize [email or web address] to access their referred [place] patient's information.

## 2021-05-29 NOTE — PT Treatment (Signed)
[facility] [place]  [place], [place] [address]  [phone]         Physical Therapy Acute Care Daily Treatment Record       Date: 05/29/2021  Time: 09:46  Patient's Name: Hector [name]  Date of Birth: 10-22-1961  Room Number: 653/A    Pertinent Information since last Physical Therapy visit :    No significant change since last session.    Precautions / Limitations :    Fall precautions     Weight Bearing status, if applicable :    No Weightbearing restrictions     Subjective :   Patient reports he continues to feel better.     Pain Rating :   Pre-Treatment Pain: 0  Post-Treatment Pain: 0  Comments:                                  Orientation & Level of Orientation :    Alert, Cooperative and Confused      6 Clicks Score :    1.) Turning from back to side while flat in bed without using a bed rail.   A little assistance (3)  2.) Moving from lying on back to sitting on the side of a flat bed without using bed rails.  A little assistance (3)  3.) Moving to and from a bed to a chair, including a wheelchair.   A little assistance (3)  4.) Standing up for chair using your arms (eg, wheelchair or bedside chair).  A little assistance (3)  5.) Walking in hospital room.  A little assistance (3)  6.) Climbing 3-5 steps with a railing.   A lot of assistance (2)    6 Clicks Plan: Score <68 - PT consult is appropriate; placement may be necessary at discharge      Treatment :      Transfers:  Rolling in bed:    Minimal Assist  with assist of 1                                                Supine to sit:   Minimal Assist   with assist of 1                                Sit to stand:  Minimal Assist   with assist of 1                                                                Gait:     Distance:    19' to 22'               Device Utilized:   Rollator Walker          Level of Assist: Minimal Assist  with assist of  1      Weight-Bearing Status:   No Weightbearing restrictions                        Gait Deviations: Step length decreased and Balance impaired     Sitting Balance: Fair +                           Standing Balance:  Fair +                  Stairs:                            Therapeutic Exercise:                  Therapeutic Activity:             Canalith Repositioning:             Other Treatment Interventions:       Daily Assessment of Status :    Patient performed supine to sit transfer x min A x 1 and sit to stand x min A x 1. Patient ambulated 50 + 30 feet with rollator x min A x 1. Patient demonstrates decreased balance and endurance of gait.     Timed Charge Number of Minutes   Neuromuscular Re-Education     Gait Training  11   Therapeutic Activity     Therapeutic Exercise      Canalith Re-positioning   Non-timed charge    Other     Total Treatment Time  11         Interdisciplinary Communication :      At end of session, patient was returned to bed with all needs within reach.       Discharge Planning :   Skilled Care    Plan :   Continue Physical Therapy program     Nada Libman, [place]  05/29/2021, 14:33

## 2021-05-29 NOTE — Care Management Notes (Signed)
Patient discussed in [place] rounds today.  He and his HCS decided yesterday that patient will DC home with home health.  No SS needs at this time.

## 2021-05-29 NOTE — OT Treatment (Signed)
[facility]  Occupational Therapy Progress Note    Patient Name: Hector [name]  Date of Birth: Sep 04, 1961  Height:  182.9 cm (6' 0.01")  Weight:  102 kg (225 lb 5 oz)  Room/Bed: 653/A  Payor: [place] / Plan: HEALTH PLAN MEDICAID / Product Type: Medicaid [place] /     Assessment:       05/29/21 0945   Therapist Pager   OT Assigned/ Pager # [phone], Sharlett Iles COTA/L, CLT   Daily Activity AM-PAC/6-clicks Score   Putting on/Taking off clothing on lower body 2   Bathing 2   Toileting 2   Putting on/Taking off clothing on upper body 3   Personal grooming 3   Eating Meals 1   Raw Score Total 13   Standardized (t-scale) Score 32.03   CMS 0-100% Score 63.03   CMS Modifier CL         Discharge Needs:   Equipment Recommendation:TBD    Discharge Disposition: home with home health    Plan:   Continue to follow patient according to established plan of care.  The risks/benefits of therapy have been discussed with the patient/caregiver and he/she is in agreement with the established plan of care.     Subjective & Objective:     Pt seen this day with clearance from nursing, pt was supine in bed upon entry into room, alert and willing to participate in therapy, pt was min a for bed mobility supine to edge of bed, displayed fair sitting balance at edge of bed, min a for sit to stand and maintain with DME, pt was able to complete chair transfer with cues for hand placement, pt returned to supine position in bed with all needs met and alarm in place upon OT exit from room.    Pain:  no c/o pain    Level of consciousness alert    Orientation x3      Patient education transfer safety            Therapist:   Sharlett Iles, COTA     Start Time: 0945  End Time: 9629  Total Treatment Time: 13 minutes  Charges Entered: therapeutic activity  Department Number: [phone]

## 2021-05-29 NOTE — Respiratory Therapy (Signed)
05/29/21 1700        Home Oxygen Assessment   O2 sat on Room Air at Rest 87   O2 sat on Oxygen at rest 90  (on 3L)   O2 sat on NC while Ambulating 91  (on 4L)   Oxygen Amount (LPM) 4  (3L at rest 4L on exertion)   $Home Oxygen Challenge Completed Completed

## 2021-05-29 NOTE — Care Management Notes (Signed)
[facility] Management Note    Patient Name: Hector [name]  Date of Birth: [birth year]  Sex: male  Date/Time of Admission: 05/01/2021  9:05 AM  Room/Bed: 653/A  Payor: [place] / Plan: [place] / Product Type: Medicaid [place] /    LOS: 28 days   Primary Care Providers:  Pcp, No (General)    Admitting Diagnosis:  Multiple rib fractures [S22.49XA]    Assessment:      05/29/21 1330   Assessment Details   Assessment Type Continued Assessment   Insurance Information/Type   Insurance type Medicaid   Care Management Plan   Discharge Planning Status plan in progress   Discharge plan discussed with: Patient;Other (Comment)  (Significant other)   Discharge Needs Assessment   Discharge Facility/Level of Care Needs Home (Patient/Family Member/other)(code 1)       Discharge Plan:  Home (Patient/Family Member/other) (code 1)    CM met with patient and his significant other, [name], at bedside for discuss discharge plans. We discussed SNF vs. Home health. Patient is adamant he does not want SNF. [name] reports her 2 sons live with them as well. One works night turn and is there during the day and her other son does not work so they will be able to assist getting patient up. We discussed home health options and patient is refusing. He states he has a dog and states he is not willing to tie up or put dog outside for visits and the dog would not allow strangers in the home. We discussed outpatient options and he stated absolutely not. Patient does not have a PCP. He followed up with Family Medicine once after an ER visit. He does not want assistance with obtaining a PCP, he is unable to give reason why. We discussed importance of follow up visits and need for medication refills. Urgent Care has Primary Care if he wishes to do that as an option, [name] reports she does this as well, but patient refusing. He states he does not want any help and he will "figure it out". [name] reports patient sometimes  follows with Barbaraann Rondo at [facility] and will prescribe medications.   Patient and significant other denies any other questions. They were encouraged to let CM know if patient changes his mind and wants assistance with home health or arranging PCP. We did discussed TCC one time follow up appointment when he is discharged, he will be called and made an appointment.    The patient will continue to be evaluated for developing discharge needs.     Case Manager: Princella Ion, RN  Phone: [phone]

## 2021-05-30 ENCOUNTER — Other Ambulatory Visit: Payer: Self-pay | Admitting: Internal Medicine

## 2021-05-30 ENCOUNTER — Telehealth (HOSPITAL_COMMUNITY): Payer: Self-pay | Admitting: Nurse Practitioner

## 2021-05-30 NOTE — Care Management Notes (Signed)
Patient dcd home on 6/15. No SS needs at this time.

## 2021-05-30 NOTE — Speech Therapy (Signed)
[facility]  Inpatient Speech Therapy  [place]  [place], [place] [address]  Phone: [phone]  Fax: [phone]    Inpatient Swallowing Discharge Summary    Patient Name: Hector [name]    Date of Birth: [birth year]    Patient MRN: [id number]    Dysphagia treatment received: Dysphagia treatment, Modified diet textures for safe swallowing, Aspiration precautions, Swallowing exercises, Therapeutic trials  and Modified Barium Swallow Study    Recommended diet consistency at time of discharge: Dysphagia Mechanical Soft and Nectar Thick/Mildly Thick/ Level 2    Aspiration Precautions/Compensatory Strategies: Elevate HOB to a 90 degree seated position, No straws, Single sips, Small bites, Encourage chewing and Reflux precautions/remain upright after meals for 30 mins     Medication administration: Pills whole 1 at a time w/ recommended liquid consistency and Pills whole in applesauce/pudding    Feeding Assistance: Assistance w/ tray set up and/or positioning    Discharge destination: Home w/ self care    Reason for discharge: Patient discharged from this facility     Goal status on discharge: Goals partially met, but adequate for a safe discharge      Therapist:   Myrla Halsted, SLP

## 2021-06-03 ENCOUNTER — Ambulatory Visit: Payer: MEDICAID | Attending: Nurse Practitioner | Admitting: Nurse Practitioner

## 2021-06-03 DIAGNOSIS — S3600XA Unspecified injury of spleen, initial encounter: Secondary | ICD-10-CM | POA: Insufficient documentation

## 2021-06-03 DIAGNOSIS — F17200 Nicotine dependence, unspecified, uncomplicated: Secondary | ICD-10-CM | POA: Insufficient documentation

## 2021-06-03 DIAGNOSIS — S22009A Unspecified fracture of unspecified thoracic vertebra, initial encounter for closed fracture: Secondary | ICD-10-CM | POA: Insufficient documentation

## 2021-06-03 DIAGNOSIS — K7460 Unspecified cirrhosis of liver: Secondary | ICD-10-CM | POA: Insufficient documentation

## 2021-06-03 DIAGNOSIS — Z7689 Persons encountering health services in other specified circumstances: Secondary | ICD-10-CM | POA: Insufficient documentation

## 2021-06-03 DIAGNOSIS — S2242XA Multiple fractures of ribs, left side, initial encounter for closed fracture: Secondary | ICD-10-CM | POA: Insufficient documentation

## 2021-06-03 DIAGNOSIS — F1010 Alcohol abuse, uncomplicated: Secondary | ICD-10-CM | POA: Insufficient documentation

## 2021-06-03 NOTE — Progress Notes (Addendum)
Transitional Care services provided:  Discharge documentation was reviewed, Education about medication(s) provided to patient/ family/ caregiver   and Education provided to patient-family-caregiver about how to access care or advice   Transition of Care Contact  Discharge Date: 05/29/2021  Transition Facility Type  Facility Name  Interactive Contact(s): Completed or attempted contact indicated by Date/Time  Completed Contact: 05/30/2021 11:35 AM  Contact Method(s)-- Patient/Caregiver Telephone  Clinical Staff Name/Role who contacted--Paula Saseen  Transition Assessment  Discharge Summary obtained?  How are you recovering?  Discharge Meds obtained?  Medications reconcilled with Discharge Documentation?  Medication Concerns?  Have everything needed for recovery?  Care Coordination:   Patient has transition follow-up appointment date and time?--Yes  Primary Care Transition Visit planned?  Specialist Transition Visit planned?  Patient/caregiver plans to attend transition visit?--Yes  Primary [place] or DME ordered at discharge?  Clinician/Team notified?--Yes  Primary reason clinician notified?  Transition Note:   Called patient to schedule follow-up visit.  He is scheduled for 06/03/21 at 1:00pm.  Explained that the Pharmacist will review his medications and the Nurse Practitioner will answer any questions/concerns.     Further information may be documented in relevant telephone or outreach encounter.      [address]  [place] [address]  Operated by [facility]  Video Visit     Name: Hector [name]  MRN: [id number]    Date: 06/03/2021  Age: 60 y.o.                            Patient's location: [place] [address]   Patient/family aware of provider location: Yes  Patient/family consent for video visit: Yes  Interview and observation performed by: Danelle Berry, FNP-C    Chief Complaint: Hospital Follow Up    History of  Present Illness:  Hector [name] is a 60 y.o. male seen via telehealth in the [facility] for follow-up after discharge on 6/15 after a lengthy hospital stay s/p fall down 11 stair due to alcohol intoxication. Patient suffered from a splenic injury, multiple rib fractures, a thoracic vertebra fracture, mechanical ventilation require due to a pneumothorax and pneumonia. During hospital stay patient refused assistance with finding a PCP. Taking all medications as instructed. Was sent home with 3.5L of O2 to be worn continuously. Reports a significant about of back/rib pain with movement and coughing. Has an occasional productive cough. Appetite improving. Still feeling very weak. Reports to not drinking alcohol since discharge. Denies any increased sob, fever, chills, n/v/d.     Past Medical History:  He has a past medical history of Alcohol abuse, Cirrhosis (CMS [place]), Delirium, withdrawal, alcoholic (CMS HCC), HTN (hypertension), Unknown cause of injury, and Wears glasses.    Past Surgical History:  He has a past surgical history that includes hx tonsillectomy.    Problem List:  He has Multiple rib fractures; Left pulmonary contusion; Spleen injury; Closed fracture of transverse process of thoracic vertebra (CMS HCC); Cirrhosis of liver without ascites (CMS HCC); Thrombocytopenia (CMS [place]); Alcohol abuse; On mechanically assisted ventilation (CMS [place]); Pleural effusion; Encephalopathy; Serum ammonia increased (CMS [place]); Ileus (CMS HCC); Leukopenia; Status post thoracentesis; and Tobacco use disorder on their problem list.    Medications:  .  lactulose  .  lidocaine  .  melatonin  .  nicotine  .  OLANZapine  .  pantoprazole  .  potassium chloride  .  rifAXIMin  .  sertraline  .  traZODone     Review of Systems:  Other than ROS in the HPI, all other systems were negative.      Data Reviewed:   Labs, imaging, and past medical records reviewed.      Assessment/Plan:       2 ICD-10-CM    1. Encounter for  support and coordination of transition of care  Z76.89    2. Closed fracture of multiple ribs of left side, initial encounter  S22.42XA    3. Closed fracture of transverse process of thoracic vertebra, initial encounter (CMS [place])  S22.009A    4. Injury of spleen, initial encounter  S36.00XA    5. Cirrhosis of liver without ascites, unspecified hepatic cirrhosis type (CMS HCC)  K74.60    6. Alcohol abuse  F10.10    7. Tobacco use disorder  F17.200      No orders of the defined types were placed in this encounter.    Follow Up:      Patient Instructions   Continue current medications  Will reach out to physician locator to get established with a PCP  3.5L of O2 continuously   Cough/deep breathing encouraged  Alcohol and smoking cessation encouraged  Please call our office at [phone] with any questions or concerns about your hospital stay  Call PCP or go to the ER with any new or concerning symptoms     Total time 12 minutes      Danelle Berry, FNP-C  06/03/2021, 13:39    This note was partially created using M*Modal fluency direct system (voice recognition software ) and is inherently subject to errors including those of syntax and "sound- alike" substitutions which may escape proofreading.  In such instances, original meaning may be extrapolated by contextual derivation.

## 2021-06-03 NOTE — Addendum Note (Signed)
Addended by: Charlynne Cousins on: 06/03/2021 01:52 PM     Modules accepted: Level of Service

## 2021-06-03 NOTE — Patient Instructions (Signed)
Continue current medications  Will reach out to physician locator to get established with a PCP  3.5L of O2 continuously   Cough/deep breathing encouraged  Alcohol and smoking cessation encouraged  Please call our office at [phone] with any questions or concerns about your hospital stay  Call PCP or go to the ER with any new or concerning symptoms

## 2021-06-12 NOTE — Progress Notes (Signed)
Cardiology Office Note    Date:  06/12/2021   ID:  Curtis Luna, DOB 1961-08-07, MRN [id number]  PCP:  Darreld Mclean, MD  Cardiologist:  Elouise Munroe, MD  Electrophysiologist:  None   Evaluation Performed:  Follow-Up Visit  Chief Complaint:  Palpitations, CAD  History of Present Illness:    Curtis Luna is a 60 y.o. male with ocular migraines, PACs, hyperlipidemia, GERD, thyroid nodules, who presents for follow-up.    Did not tolerate metoprolol due to bradycardia/hypotension.   CCTA showed moderate CAD, moderate CAC score 213. 79th percentile, discussed risk and secondary prevention of CAD. Tried doubling pravastatin but felt sluggish. Discussed high intensity statin therapy and adding ASA.   Since his last visit, he states that he is feeling good overall. However, he is still having his occasional palpitations, with a typical duration of a few minutes. He has found a correlation between his palpitations and certain foods, including the bacon,egg, and cheese biscuits. Usually his palpitations occur right after eating. We discussed this may be GI in origin since it occurs immediately after eating.  He stopped taking ASA due to recent preventive task force guidelines. We participated in shared decision making and reviewed guideline recommendations for secondary prevention of CAD.   Currently he is on a Vitamin D supplement. At his last physical, vitamin D levels were slightly low but currently, he is near to goal. His most recent labs (02/2021) showed his LDL was 61, Triglycerides 76, HDL 50.  Patient denies chest pain, shortness of breath, headaches, lightheadedness, LE Edema, syncope,orthopnea, PND.  Past Medical History:  Diagnosis Date   Abdominal pain, right lower quadrant    Acute bronchospasm    Allergy    Anxiety    on and off   Backache, unspecified    Chicken pox    Diverticulosis of colon (without mention of hemorrhage)    Esophageal reflux    External  hemorrhoids without mention of complication    thrombosed   Hiatal hernia    Hyperlipidemia    no medicines    Inguinal hernia without mention of obstruction or gangrene, unilateral or unspecified, (not specified as recurrent)    Migraine    Obstructive sleep apnea (adult) (pediatric)    Other symptoms involving digestive system(787.99)    Pain in joint, forearm    Pain in joint, shoulder region    Seasonal allergies    Sleep apnea    no cpap but uses dental device    Thoracic or lumbosacral neuritis or radiculitis, unspecified    Past Surgical History:  Procedure Laterality Date   COLON RESECTION  10/06   sigmoid colon   COLONOSCOPY     INGUINAL HERNIA REPAIR  12/2012   Left   UPPER GASTROINTESTINAL ENDOSCOPY     WISDOM TOOTH EXTRACTION       Current Meds  Medication Sig   Ascorbic Acid (VITAMIN C) 1000 MG tablet Take 1,000 mg by mouth daily.   b complex vitamins tablet Take 1 tablet by mouth daily. Every other day   celecoxib (CELEBREX) 200 MG capsule Take 200 mg by mouth daily as needed.   cholecalciferol (VITAMIN D3) 25 MCG (1000 UT) tablet Take 1,000 Units by mouth daily.   esomeprazole (NEXIUM) 40 MG capsule Take 1 capsule (40 mg total) by mouth in the morning and at bedtime.   gabapentin (NEURONTIN) 100 MG capsule TAKE 1 CAPSULE BY MOUTH 3  TIMES DAILY   LORazepam (ATIVAN) 1  MG tablet Take 1 tablet (1 mg total) by mouth as needed for anxiety. May take 1 per day   Multiple Vitamin (MULTIVITAMIN) tablet Take 1 tablet by mouth daily. Men's 50 plus multivitamin daily   rosuvastatin (CRESTOR) 20 MG tablet Take 1 tablet (20 mg total) by mouth daily.     Allergies:   Escitalopram and Protonix [pantoprazole sodium]   Social History   Tobacco Use   Smoking status: Former    Pack years: 0.00    Types: Cigarettes, Cigars    Quit date: 10/04/1987    Years since quitting: 33.7   Smokeless tobacco: Never  Vaping Use   Vaping Use: Never used  Substance Use Topics    Alcohol use: Yes    Alcohol/week: 7.0 - 14.0 standard drinks    Types: 7 - 14 Standard drinks or equivalent per week    Comment: 2 daily    Drug use: No     Family Hx: The patient's family history includes Allergies in his daughter; Aneurysm in his sister; Diabetes in his maternal aunt; Healthy in his brother; Heart attack in his paternal grandmother; Heart disease in his paternal grandmother; Lung cancer (age of onset: 80) in his mother; Migraines in his daughter; Other in his father. There is no history of Colon cancer, Colon polyps, Esophageal cancer, Rectal cancer, Stomach cancer, Pancreatic cancer, Allergic rhinitis, Angioedema, Asthma, Atopy, Eczema, Immunodeficiency, or Urticaria.  ROS:   Please see the history of present illness.    (+) Heart flutters (Palpitations) All other systems reviewed and are negative.   Prior CV studies:   The following studies were reviewed today: Monitor 03/12/2020:  Indication: palpitations   Minimum HR (bpm): 55 Maximum HR (bpm): 127   Supraventricular Ectopy: Rare <1% SVT: none   Ventricular Ectopy: Rare <1% NSVT: none Ventricular Tachycardia: none   Pauses: none AV block: none   Atrial fibrillation: none   Diary events: fluttering, skipped beats associated with isolated ventricular ectopic beats.  Impression: Rare supraventricular and ventricular ectopy, diary events associated with isolated ventricular ectopic beats.   ECHO 03/09/2018: Left ventricle: The cavity size was normal. Systolic function was    normal. The estimated ejection fraction was in the range of 55%    to 60%. Wall motion was normal; there were no regional wall    motion abnormalities. Left ventricular diastolic function    parameters were normal.   Labs/Other Tests and Data Reviewed:    EKG:   06/13/2021: normal sinus rhythm, rate of 68 10/30/2020: EKG was not ordered.  Recent Labs: 10/03/2020: Hemoglobin 14.9; Platelets 297 10/12/2020: ALT  21 02/18/2021: BUN 17; Creatinine, Ser 0.99; Potassium 4.7; Sodium 140   Recent Lipid Panel Lab Results  Component Value Date/Time   CHOL 125 02/18/2021 08:56 AM   CHOL 154 10/12/2020 09:02 AM   TRIG 76.0 02/18/2021 08:56 AM   HDL 49.50 02/18/2021 08:56 AM   HDL 52 10/12/2020 09:02 AM   CHOLHDL 3 02/18/2021 08:56 AM   LDLCALC 61 02/18/2021 08:56 AM   LDLCALC 83 10/12/2020 09:02 AM   LDLDIRECT 146 (H) 10/17/2014 09:30 AM    Wt Readings from Last 3 Encounters:  04/02/21 148 lb (67.1 kg)  03/19/21 148 lb (67.1 kg)  02/18/21 150 lb (68 kg)     Risk Assessment/Calculations:      Objective:    Vital Signs:  BP 116/76   Pulse 68   Ht 5\' 5"  (1.651 m)   Wt 149 lb 9.6 oz (67.9  kg)   BMI 24.89 kg/m    Constitutional: No acute distress Eyes: sclera non-icteric, normal conjunctiva and lids ENMT: normal dentition, moist mucous membranes Cardiovascular: regular rhythm, normal rate, no murmurs. S1 and S2 normal. Radial pulses normal bilaterally. No jugular venous distention.  Respiratory: clear to auscultation bilaterally GI : normal bowel sounds, soft and nontender. No distention.   MSK: extremities warm, well perfused. No edema.  NEURO: grossly nonfocal exam, moves all extremities. PSYCH: alert and oriented x 3, normal mood and affect.    ASSESSMENT & PLAN:    1. Coronary artery disease involving native coronary artery of native heart without angina pectoris   2. Pure hypercholesterolemia   3. Palpitations   4. Medication management     CAD -  - continue ASA 81 mg daily and statin. He will resume aspirin today after our discussion on secondary prevention of CAD.  HLD - lipids improved, and at goal. Continue statin.  Palpitations  - stable overall, continue to observe. Low risk cardiac monitor. Did not tolerate metoprolol but overall feeling well and trying to avoid triggering foods.    Total time of encounter: 30 minutes total time of encounter, including 20 minutes  spent in face-to-face patient care on the date of this encounter. This time includes coordination of care and counseling regarding above mentioned problem list. Remainder of non-face-to-face time involved reviewing chart documents/testing relevant to the patient encounter and documentation in the medical record. I have independently reviewed documentation from referring provider.   Cherlynn Kaiser, MD, [facility]   Medication Adjustments/Labs and Tests Ordered: Current medicines are reviewed at length with the patient today.  Concerns regarding medicines are outlined above.   Patient Instructions  Medication Instructions:  RESUME ASPIRIN 81mg  DAILY- PLEASE CALL us WITH ANY ISSUES OR CONCERNS  *If you need a refill on your cardiac medications before your next appointment, please call your pharmacy*  Follow-Up: At [facility], you and your health needs are our priority.  As part of our continuing mission to provide you with exceptional heart care, we have created designated Provider Care Teams.  These Care Teams include your primary Cardiologist (physician) and Advanced Practice Providers (APPs -  Physician Assistants and Nurse Practitioners) who all work together to provide you with the care you need, when you need it.  Your next appointment:   1 year(s)  The format for your next appointment:   In Person  Provider:   Cherlynn Kaiser, MD  Ardell Isaacs as a scribe for Elouise Munroe, MD.,have documented all relevant documentation on the behalf of Elouise Munroe, MD,as directed by  Elouise Munroe, MD while in the presence of Elouise Munroe, MD.   I, Elouise Munroe, MD, have reviewed all documentation for this visit. The documentation on 06/13/21 for the exam, diagnosis, procedures, and orders are all accurate and complete.

## 2021-06-13 ENCOUNTER — Other Ambulatory Visit: Payer: Self-pay

## 2021-06-13 ENCOUNTER — Ambulatory Visit (INDEPENDENT_AMBULATORY_CARE_PROVIDER_SITE_OTHER): Payer: 59 | Admitting: Internal Medicine

## 2021-06-13 VITALS — BP 116/76 | HR 68 | Ht 65.0 in | Wt 149.6 lb

## 2021-06-13 DIAGNOSIS — Z79899 Other long term (current) drug therapy: Secondary | ICD-10-CM | POA: Diagnosis not present

## 2021-06-13 DIAGNOSIS — E7800 Pure hypercholesterolemia, unspecified: Secondary | ICD-10-CM | POA: Diagnosis not present

## 2021-06-13 DIAGNOSIS — R002 Palpitations: Secondary | ICD-10-CM

## 2021-06-13 DIAGNOSIS — I2510 Atherosclerotic heart disease of native coronary artery without angina pectoris: Secondary | ICD-10-CM

## 2021-06-13 NOTE — Patient Instructions (Signed)
Medication Instructions:  RESUME ASPIRIN 81mg  DAILY- PLEASE CALL us WITH ANY ISSUES OR CONCERNS  *If you need a refill on your cardiac medications before your next appointment, please call your pharmacy*  Follow-Up: At [facility], you and your health needs are our priority.  As part of our continuing mission to provide you with exceptional heart care, we have created designated Provider Care Teams.  These Care Teams include your primary Cardiologist (physician) and Advanced Practice Providers (APPs -  Physician Assistants and Nurse Practitioners) who all work together to provide you with the care you need, when you need it.  Your next appointment:   1 year(s)  The format for your next appointment:   In Person  Provider:   Cherlynn Kaiser, MD

## 2021-06-18 ENCOUNTER — Other Ambulatory Visit: Payer: Self-pay

## 2021-06-18 ENCOUNTER — Ambulatory Visit: Payer: MEDICAID | Attending: Internal Medicine | Admitting: Internal Medicine

## 2021-06-18 ENCOUNTER — Encounter (INDEPENDENT_AMBULATORY_CARE_PROVIDER_SITE_OTHER): Payer: Self-pay | Admitting: Internal Medicine

## 2021-06-18 VITALS — BP 122/70 | HR 74 | Resp 20 | Ht 68.0 in | Wt 191.0 lb

## 2021-06-18 DIAGNOSIS — F17200 Nicotine dependence, unspecified, uncomplicated: Secondary | ICD-10-CM | POA: Insufficient documentation

## 2021-06-18 DIAGNOSIS — F17210 Nicotine dependence, cigarettes, uncomplicated: Secondary | ICD-10-CM

## 2021-06-18 DIAGNOSIS — Z9981 Dependence on supplemental oxygen: Secondary | ICD-10-CM

## 2021-06-18 DIAGNOSIS — F1010 Alcohol abuse, uncomplicated: Secondary | ICD-10-CM | POA: Insufficient documentation

## 2021-06-18 DIAGNOSIS — S2249XS Multiple fractures of ribs, unspecified side, sequela: Secondary | ICD-10-CM | POA: Insufficient documentation

## 2021-06-18 DIAGNOSIS — K7030 Alcoholic cirrhosis of liver without ascites: Secondary | ICD-10-CM | POA: Insufficient documentation

## 2021-06-18 DIAGNOSIS — S2249XD Multiple fractures of ribs, unspecified side, subsequent encounter for fracture with routine healing: Secondary | ICD-10-CM

## 2021-06-18 DIAGNOSIS — J449 Chronic obstructive pulmonary disease, unspecified: Secondary | ICD-10-CM | POA: Insufficient documentation

## 2021-06-18 NOTE — Patient Instructions (Signed)
Continue same meds

## 2021-06-18 NOTE — Progress Notes (Signed)
INTERNAL MEDICINE, [place] [address]  Phone: [phone]  Fax: [phone]    Encounter Date: 06/18/2021    Patient ID:  Hector [name]  KZL:[id number]    DOB: 07-02-1961  Age: 60 y.o. male    Progress Note    Subjective:     Chief Complaint   Patient presents with   . Establish Care     Patient here as new patient, was DC from [facility] on 05/29/21 after falling down a flight of steps, patient accompanied by significant other,            HPI new patient is a 60 year old white male who is alcoholic apparently had a fall last month where he sustained multiple rib fracture he was intubated on mechanical ventilation.  Today's here to establish as a new patient he has not seen a primary care physician for many years.  Is accompanied by a friend.  As for her she has not been drinking alcohol since he was discharged from the hospital a.    He also has major depression for which she has been supposed to be following up with mental health specialist which she has not done yet.  Currently he is on oxygen since his hospital discharge    Patient complains of chest wall pain and back pain    Current Outpatient Medications   Medication Sig   . lidocaine (LIDODERM) 5 % Adhesive Patch, Medicated Place 1 Patch (700 mg total) on the skin Once a day for 30 days   . melatonin 3 mg Oral Tablet Take 2 Tablets (6 mg total) by mouth Every night for 30 days   . OLANZapine (ZYPREXA) 10 mg Oral Tablet Take 10 mg by mouth Every night   . pantoprazole (PROTONIX) 40 mg Oral Tablet, Delayed Release (E.C.) Take 1 Tablet (40 mg total) by mouth Once a day for 30 days   . potassium chloride (K-DUR) 20 mEq Oral Tab Sust.Rel. Particle/Crystal Take 0.5 Tablets (10 mEq total) by mouth Once a day for 30 days   . rifAXIMin (XIFAXAN) 550 mg Oral Tablet Take 1 Tablet (550 mg total) by mouth Every 12 hours for 30 days   . sertraline (ZOLOFT) 100 mg Oral Tablet Take 2 Tablets (200 mg total) by mouth Once a day for 30  days   . traZODone (DESYREL) 50 mg Oral Tablet Take 50 mg by mouth Every night Unsure dosage     Allergies   Allergen Reactions   . Bee Venom Protein (Honey Bee)      Bee stings     Past Medical History:   Diagnosis Date   . Alcohol abuse    . Cirrhosis (CMS [place])    . Delirium, withdrawal, alcoholic (CMS HCC)    . HTN (hypertension)    . Unknown cause of injury     fx nose   . Wears glasses          Past Surgical History:   Procedure Laterality Date   . HX TONSILLECTOMY           Family Medical History:     Problem Relation (Age of Onset)    Asthma Father          Social History     Tobacco Use   . Smoking status: Current Every Day Smoker     Packs/day: 1.50   . Smokeless tobacco: Never Used   Vaping Use   . Vaping Use: Never  used   Substance Use Topics   . Alcohol use: Yes     Alcohol/week: 15.0 standard drinks     Types: 15 Cans of beer per week     Comment: daily   . Drug use: Yes     Types: Marijuana     Comment: daily       Review of Systems   Constitutional: Negative for activity change, appetite change, fever and unexpected weight change.   HENT: Negative for congestion and ear pain.    Eyes: Negative for visual disturbance.   Respiratory: Negative for cough, chest tightness, shortness of breath and wheezing.    Cardiovascular: Negative for chest pain, palpitations and leg swelling.   Gastrointestinal: Negative for abdominal pain, constipation and diarrhea.   Genitourinary: Negative for dysuria and frequency.   Musculoskeletal: Positive for back pain and gait problem. Negative for arthralgias.   Skin: Negative for color change and rash.   Neurological: Positive for weakness. Negative for dizziness and light-headedness.   Psychiatric/Behavioral: Negative for behavioral problems and sleep disturbance. The patient is not nervous/anxious.        Objective:   Vitals: BP 122/70 (Site: Right, Patient Position: Sitting, Cuff Size: Adult)   Pulse 74   Resp 20   Ht 1.727 m (5\' 8" )   Wt 86.6 kg (191 lb)   SpO2 96%    BMI 29.04 kg/m         Physical Exam  Constitutional:       General: He is not in acute distress.     Appearance: Normal appearance.   HENT:      Head: Normocephalic and atraumatic.      Nose: Nose normal.      Mouth/Throat:      Pharynx: Oropharynx is clear.   Eyes:      Pupils: Pupils are equal, round, and reactive to light.   Cardiovascular:      Rate and Rhythm: Normal rate and regular rhythm.      Pulses: Normal pulses.      Heart sounds: Normal heart sounds.   Pulmonary:      Effort: Pulmonary effort is normal.      Breath sounds: Normal breath sounds. No wheezing.   Abdominal:      General: Abdomen is flat. Bowel sounds are normal.      Palpations: There is no mass.      Tenderness: There is no abdominal tenderness.      Hernia: No hernia is present.   Musculoskeletal:         General: No swelling, tenderness or deformity. Normal range of motion.   Skin:     General: Skin is warm and dry.      Findings: No rash.      Comments: Scattered spider angiomata on the chest wall   Neurological:      General: No focal deficit present.      Mental Status: He is alert and oriented to person, place, and time.   Psychiatric:         Mood and Affect: Mood normal.         Behavior: Behavior normal.         Assessment & Plan:   Assessment:  ENCOUNTER DIAGNOSES     ICD-10-CM   1. Alcoholic cirrhosis, unspecified whether ascites present (CMS [place])  K70.30   2. Chronic obstructive pulmonary disease, unspecified COPD type (CMS HCC)  J44.9   3. Alcoholic cirrhosis of liver without ascites (CMS  [place])  K70.30   4. Alcohol abuse  F10.10   5. Closed fracture of multiple ribs, unspecified laterality, sequela  S22.49XS   6. Tobacco use disorder  F17.200       Plan:  1. Patient has cirrhosis of the liver no ascites at this time compensated.  Mainly he needs to be off of fall call completely discussed at length with him and his caregiver regarding alcohol and cirrhosis of the liver.  Also I am going to refer him to Gastroenterology he  will probably need an endoscopy done.    2. Multiple rib flexure history of smoking of probably my has COPD is on oxygen 247 at this time I am going to refer him to pulmonology for evaluation of needing long-term oxygen use.    3. Unsteady gait probably alcoholic injury and encephalopathy while he was in the hospital again stressed the need for using cane for ambulation and gait and balance.    3. Further repeat his labs in 3 months    4. 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  He needs to be following up with his mental health specialist.    Again told the patient and the caregiver that Terrence Dupont will be available for consultation.  Total time spent greater than 30 minutes for reviewing all his labs hospital admissions discharge summaries.  Making appropriate referrals.    Orders Placed This Encounter   . COMPREHENSIVE METABOLIC PANEL, NON-FASTING   . CBC/DIFF   . LIPID PANEL   . Refer to [place]   . Refer to [place]       Return in about 3 months (around 09/18/2021).      Jenkins Rouge, MD  06/18/2021, 13:59    This note was partially created using M*Modal fluency direct system (voice recognition software ) and is inherently subject to errors including those of syntax and "sound- alike" substitutions which may escape proofreading.  In such instances, original meaning may be extrapolated by contextual derivation.

## 2021-07-04 ENCOUNTER — Encounter: Payer: Self-pay | Admitting: Family Medicine

## 2021-07-05 ENCOUNTER — Other Ambulatory Visit (INDEPENDENT_AMBULATORY_CARE_PROVIDER_SITE_OTHER): Payer: 59

## 2021-07-05 ENCOUNTER — Other Ambulatory Visit: Payer: Self-pay

## 2021-07-05 ENCOUNTER — Encounter: Payer: Self-pay | Admitting: Family Medicine

## 2021-07-05 DIAGNOSIS — R972 Elevated prostate specific antigen [PSA]: Secondary | ICD-10-CM

## 2021-07-05 LAB — PSA: PSA: 1.17 ng/mL (ref 0.10–4.00)

## 2021-07-10 ENCOUNTER — Encounter (INDEPENDENT_AMBULATORY_CARE_PROVIDER_SITE_OTHER): Payer: Self-pay | Admitting: Nurse Practitioner

## 2021-07-12 ENCOUNTER — Other Ambulatory Visit: Payer: Self-pay

## 2021-07-12 MED ORDER — ESOMEPRAZOLE MAGNESIUM 40 MG PO CPDR: 40.0000 mg | DELAYED_RELEASE_CAPSULE | Freq: Every day | ORAL | 3 refills | Status: DC

## 2021-07-22 ENCOUNTER — Other Ambulatory Visit: Payer: Self-pay

## 2021-07-22 ENCOUNTER — Encounter (INDEPENDENT_AMBULATORY_CARE_PROVIDER_SITE_OTHER): Payer: Self-pay | Admitting: Nurse Practitioner

## 2021-07-22 ENCOUNTER — Ambulatory Visit (INDEPENDENT_AMBULATORY_CARE_PROVIDER_SITE_OTHER): Payer: MEDICAID | Admitting: Nurse Practitioner

## 2021-07-22 ENCOUNTER — Inpatient Hospital Stay (INDEPENDENT_AMBULATORY_CARE_PROVIDER_SITE_OTHER)
Admission: RE | Admit: 2021-07-22 | Discharge: 2021-07-22 | Disposition: A | Discharge status: Home / Self Care | Payer: MEDICAID | Source: Ambulatory Visit | Attending: Nurse Practitioner | Admitting: Nurse Practitioner

## 2021-07-22 VITALS — BP 110/68 | HR 73 | Temp 98.1°F | Resp 16 | Ht 69.0 in | Wt 204.0 lb

## 2021-07-22 DIAGNOSIS — Z8701 Personal history of pneumonia (recurrent): Secondary | ICD-10-CM

## 2021-07-22 DIAGNOSIS — Z7185 Encounter for immunization safety counseling: Secondary | ICD-10-CM

## 2021-07-22 DIAGNOSIS — F17200 Nicotine dependence, unspecified, uncomplicated: Secondary | ICD-10-CM

## 2021-07-22 DIAGNOSIS — J449 Chronic obstructive pulmonary disease, unspecified: Secondary | ICD-10-CM

## 2021-07-22 DIAGNOSIS — J90 Pleural effusion, not elsewhere classified: Secondary | ICD-10-CM

## 2021-07-22 DIAGNOSIS — Z6830 Body mass index (BMI) 30.0-30.9, adult: Secondary | ICD-10-CM

## 2021-07-22 DIAGNOSIS — Z716 Tobacco abuse counseling: Secondary | ICD-10-CM

## 2021-07-22 MED ORDER — ALBUTEROL SULFATE HFA 90 MCG/ACTUATION AEROSOL INHALER
1.0000 | INHALATION_SPRAY | Freq: Four times a day (QID) | RESPIRATORY_TRACT | 5 refills | Status: DC | PRN
Start: 2021-07-22 — End: 2022-05-28

## 2021-07-22 MED ORDER — IPRATROPIUM 0.5 MG-ALBUTEROL 3 MG (2.5 MG BASE)/3 ML NEBULIZATION SOLN
3.0000 mL | INHALATION_SOLUTION | Freq: Three times a day (TID) | RESPIRATORY_TRACT | 5 refills | Status: DC
Start: 2021-07-22 — End: 2022-05-28

## 2021-07-22 NOTE — Progress Notes (Signed)
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 POST THORACENTESIS.  s/p left thoracentisis.     TECHNIQUE: Frontal view of the chest.    COMPARISON:  Previous exam from today    FINDINGS:  ET tube 5 cm above the carina. There is an NG tube in the stomach. There is a left PICC catheter in the SVC   Heart size is mildly enlarged.     There are perihilar and basilar infiltrates. Possible small effusions obscuring the diaphragms   There are multiple left lateral rib fracture deformities        Impression    Negative for pneumothorax after thoracentesis      Radiologist location ID: [id number]     XR AP MOBILE CHEST     Status: None    Narrative    Taahir LEE Clutter    RADIOLOGIST: Otho Najjar, MD    XR AP MOBILE CHEST performed on 05/09/2021 6:15 AM    CLINICAL HISTORY: Mult. L rib fx.s, effusion, intubated.  resp fail-vent, effusion, multiple rib fx's    TECHNIQUE: Frontal view of the chest.    COMPARISON:  05/07/2021    FINDINGS:  The support lines and tubes are in stable and satisfactory position. No pneumothorax.     There is increasing opacity bilaterally in the mid lung fields with infiltrate, effusion and vascular congestion. This could be inflammatory but the symmetry suggests development of pulmonary edema. Clinical correlation recommended.      Impression    Increasing  opacity bilaterally suggesting increasing CHF. Clinical correlation recommended.      Radiologist location ID: [id number]     XR AP MOBILE CHEST     Status: None    Narrative    Taariq LEE Olexa    RADIOLOGIST: Colletta Maryland, MD    XR AP MOBILE CHEST performed on 05/10/2021 6:19 AM    CLINICAL HISTORY: respiratory failure.  resp fail-vent    TECHNIQUE: Frontal view of the chest.    COMPARISON:  05/09/2021    FINDINGS:  The support lines and tubes are in stable and satisfactory position.   Cardiac and mediastinal contours are stable.   Airspace disease is present in the mid to lower lungs with probable bilateral pleural effusions. Lung volumes are slightly improved since yesterday           Impression    BILATERAL AIRSPACE DISEASE AND PLEURAL EFFUSIONS WITH SOME IMPROVEMENT SINCE YESTERDAY      Radiologist location ID: [id number]     CT ANGIO CHEST FOR PULMONARY EMBOLUS W IV CONTRAST     Status: None    Narrative    Suhaas LEE Myszka    RADIOLOGIST: Colletta Maryland, MD    CT ANGIO CHEST FOR PULMONARY EMBOLUS W IV CONTRAST performed on 05/10/2021 11:49  AM    CLINICAL HISTORY: hypoxia.  on the Vent , increased hypoxia. CXR showed increased opacities  Increase in CHF  Lung sounds coarse  . fall on 05-01-21    TECHNIQUE: CTA imaging of the chest with intravenous contrast.  3D reconstructions.  CONTRAST:  100 ml's of Isovue 370    COMPARISON: 05/03/2021  # of known CTs in the past 12 months: 7   # of known Cardiac Nuclear Medicine Studies in the past 12 months: 0         FINDINGS:  Hardware:  There is an NG tube in the stomach. An ET tube is present in the trachea. There is a left-sided PICC line in the SVC.    Lymph nodes:   No mediastinal, hilar, or axillary lymphadenopathy.    Heart:  Coronary artery calcifications are noted.        RV/LV Diameter Ratio: N/A    Thoracic Aorta:  No thoracic aortic aneurysm or dissection.    Pulmonary Vessels:  No evidence of acute pulmonary emboli through the major subsegmental  branches.    Lungs and Airways:  There is patient respiratory motion artifact which limits evaluation of the lungs.      Bilateral lower lobe airspace disease with air bronchograms. Pneumonia versus atelectasis.      Pleura: Bilateral pleural effusions left larger than right    Upper Abdomen: Cirrhosis with splenomegaly. Small volume of ascites. Probable cholelithiasis.    Bones: Bone windows are unremarkable.        Impression    1.NO PULMONARY EMBOLUS  2.BILATERAL PLEURAL EFFUSIONS  3.BILATERAL LOWER LOBE AIRSPACE DISEASE. PNEUMONIA VERSUS ATELECTASIS      One or more dose reduction techniques were used (e.g., Automated exposure control, adjustment of the mA and/or kV according to patient size, use of iterative reconstruction technique).      Radiologist location ID: [id number]     XR AP MOBILE CHEST     Status: None    Narrative    Derold LEE Ram    RADIOLOGIST: Colletta Maryland, MD    XR AP MOBILE CHEST performed on 05/11/2021 5:35 AM    CLINICAL HISTORY: respiratory failure.  resp fail-vent    TECHNIQUE: Frontal view of the chest.    COMPARISON:  Yesterday    FINDINGS:  The support lines and tubes are in stable and satisfactory position.   Cardiac and mediastinal contours are stable.   Bilateral airspace disease with bilateral pleural effusions similar to yesterday           Impression    NO ACUTE FINDINGS.      Radiologist location ID: [id number]     CT BRAIN WO IV CONTRAST     Status: None    Narrative    Billye LEE Pennie    RADIOLOGIST: Colletta Maryland, MD    CT BRAIN WO IV CONTRAST performed on 05/12/2021 2:27 AM    CLINICAL HISTORY: altered mental status.  Pt intubated and per RN very restless this early AM    TECHNIQUE:  Head CT without intravenous contrast.    COMPARISON: 05/01/2021  # of known CTs in the past 12 months: 8   # of known Cardiac Nuclear Medicine Studies in the past 12 months: 0    FINDINGS: This study is degraded by patient motion artifact  There is no acute intracranial hemorrhage, mass  effect, or evidence of large acute infarct.    Brain: Low density in the periventricular white matter  suggests mild chronic small vessel ischemic changes.    CSF Spaces: Mild generalized cerebral atrophy     Sinuses/Mastoids:  Clear at visualized levels     Bones: Unremarkable        Impression    CHRONIC CHANGES.  NO ACUTE FINDINGS.       One or more dose reduction techniques were used (e.g., Automated exposure control, adjustment of the mA and/or kV according to patient size, use of iterative reconstruction technique).      Radiologist location ID: [id number]     XR AP MOBILE CHEST     Status: None    Narrative    Bunyan LEE Zumbro    RADIOLOGIST: Colletta Maryland, MD    XR AP MOBILE CHEST performed on 05/12/2021 6:20 AM    CLINICAL HISTORY: respiratory failure.  vent management     TECHNIQUE: Frontal view of the chest.    COMPARISON:  Yesterday    FINDINGS:  The support lines and tubes are in stable and satisfactory position.   Cardiac and mediastinal contours are stable.   There is a left pleural effusion and bilateral airspace disease with possible right pleural effusion. Mild improvement in the right lung since yesterday.           Impression    BILATERAL AIRSPACE DISEASE AND PLEURAL EFFUSIONS LEFT WORSE      Radiologist location ID: [id number]     XR AP MOBILE CHEST     Status: None    Narrative    Demontez LEE Armon    RADIOLOGIST: Colletta Maryland, MD    XR AP MOBILE CHEST performed on 05/13/2021 8:23 AM    CLINICAL HISTORY: respiratory failure, PNA.  resp.failure, PNA, vent    TECHNIQUE: Frontal view of the chest.    COMPARISON:  Yesterday    FINDINGS:  The support lines and tubes are in stable and satisfactory position.   Cardiac and mediastinal contours are stable.   There is a left pleural effusion unchanged from yesterday. The right lung is improved since yesterday with improved lung volumes.           Impression    PERSISTENT LEFT PLEURAL EFFUSION      Radiologist location ID: [id number]     XR AP MOBILE  CHEST     Status: None    Narrative    Rich LEE Ketron    RADIOLOGIST: Berton Bon, MD    XR AP MOBILE CHEST performed on 05/14/2021 6:07 AM    CLINICAL HISTORY: Pleural effusions.  vent management. pleural effusion    TECHNIQUE: Frontal view of the chest.    COMPARISON:  Yesterday    FINDINGS:  ET tube 4.8 cm above the carina.. There is an NG tube extending at least into the distal esophagus and obscured over the upper abdomen. There is a left PICC catheter in the SVC   Heart size is moderately enlarged.     There are perihilar infiltrates with possible effusions obscuring the diaphragms especially on the left           Impression    Stable infiltrates/vascular congestion with probable effusions      Radiologist location ID: [id number]     XR AP MOBILE CHEST     Status: None    Narrative    Geary LEE Artley    RADIOLOGIST: Brayton El, MD    XR AP MOBILE CHEST performed on 05/15/2021 6:04 AM    CLINICAL HISTORY: Pleural effusions.  vent management     TECHNIQUE: Frontal view of the chest.    COMPARISON:  05/13/2021    FINDINGS:  A left PICC ends in the upper SVC. The endotracheal tube ends 4 cm above the carina. The enteric tube ends below the diaphragm.     The heart size is normal.     There is improving aeration of both lungs most notably at both lung bases. A trace left pleural effusion persists.         Impression    Improving aeration of both lungs. The left pleural effusion appears to have decreased in size.       Radiologist location ID: [id number]     XR ABD SUPINE     Status: None    Narrative    Jaqualin LEE Fadeley    RADIOLOGIST: Brayton El, MD    XR ABD SUPINE performed on 05/15/2021 1:02 PM.    CLINICAL HISTORY: r/o ileus - abdominal distention.      ileus    TECHNIQUE: Single view abdomen.    COMPARISON:  None.      FINDINGS:  There is a diffuse gaseous distention of the small bowel. There is also gas within the cecum suggesting this could be an ileus.     No suspicious  calcifications.    The bones are unremarkable.        Impression    Diffuse gaseous distention in a pattern suggestive of ileus. The enteric tube ends in the stomach.        Radiologist location ID: [id number]     MRI BRAIN W/WO CONTRAST     Status: None    Narrative    Tamika LEE Ismael    RADIOLOGIST: Betsey Amen, MD    MRI BRAIN W/WO CONTRAST performed on 05/15/2021 10:34 PM    CLINICAL HISTORY: inconsistant neuro exam.  Trauma, splenic laceration.  Unable to wake pt calmy since sedated.    TECHNIQUE: Routine brain MRI without and with intravenous contrast.   INTRAVENOUS CONTRAST: 20 ml's of Dotarem    COMPARISON:  None.    FINDINGS:  Brain: Normal signal intensities.    Diffusion weighted images show no evidence of acute or recent infarct.   Postcontrast images show no suspicious enhancement.    Ventricles: Normal.    Major Intracranial Vessels: Normal flow voids.    Sinuses: Clear.  Mastoids: Small amount of fluid signal is identified within both mastoid air cells.          Impression    No acute finding.        Radiologist location ID: [id number]     XR AP MOBILE CHEST     Status: None    Narrative    Kaydenn LEE Grove    RADIOLOGIST: Berton Bon, MD    XR AP MOBILE CHEST performed on 05/16/2021 6:55 AM    CLINICAL HISTORY: Pleural effusions.  RESP FAIL-VENT    TECHNIQUE: Frontal view of the chest.    COMPARISON:  Yesterday    FINDINGS:  ET tube 4.7 cm above the carina. There is an NG tube extending towards the stomach. The tip is not included on this film. There is a left PICC catheter in the SVC   Heart size is moderately enlarged.     There are bilateral perihilar and infrahilar infiltrates with obscuration of the left diaphragm           Impression    Slightly increased infiltrates/vascular congestion  Radiologist location ID: [id number]     XR KUB     Status: None    Narrative    Kona LEE Ballinger    RADIOLOGIST: Berton Bon, MD    XR KUB performed on 05/16/2021 9:31 AM.    CLINICAL HISTORY: ileus.  evaluate  ileus - abdominal distention    TECHNIQUE: Single view abdomen.    COMPARISON:  Yesterday    FINDINGS:  There is gaseous gastric distention. There is some mild diffuse gaseous small bowel distention. Some air is also suggested in the right colon    No suspicious calcifications.    There is lumbar spurring  There is an NG tube with the tip of the proximal stomach         Impression    1.Persistent gaseous intestinal distention suggesting an ileus. There is increased gastric distention since yesterday        Radiologist location ID: [id number]     [place] CHEST (EFFUSION)     Status: None    Narrative    Mickal LEE Sterba    RADIOLOGIST: Brian Schambach    [place] CHEST (EFFUSION) performed on 05/16/2021 2:28 PM    CLINICAL HISTORY: evaluate for thoracentesis.  bilateral chest     COMPARISON: None.    FINDINGS:  Ultrasound was used to evaluate for pleural effusions.    There is a small amount of perihepatic ascites. There is a trace right effusion. No left pleural fluid is seen.       Impression    As above.        Radiologist location ID: [id number]     XR AP MOBILE CHEST     Status: None    Narrative    Muaad LEE Takacs    RADIOLOGIST: Danne Baxter, MD    XR AP MOBILE CHEST performed on 05/17/2021 6:07 AM    CLINICAL HISTORY: respiratory failure.    chest trauma.   fractured ribs.  RESP FAIL-VENT    TECHNIQUE: Frontal view of the chest.    COMPARISON:  Yesterday    FINDINGS:  The support lines and tubes are in stable and satisfactory position.   The heart size is normal.   There is some stable mild opacity at both lung bases.   There is a stable small left pleural effusion.        Impression    NO SIGNIFICANT CHANGE SINCE THE PRIOR EXAM.      Radiologist location ID: [id number]     XR KUB     Status: None    Narrative    Bernabe LEE Basic    RADIOLOGIST: Danne Baxter, MD    XR KUB performed on 05/17/2021 8:25 AM.    CLINICAL HISTORY: ileus.  evaluate ileus    TECHNIQUE: Single view abdomen.    COMPARISON:   05/16/2021    FINDINGS:  There are markedly distended loops of small bowel with a paucity of air in colon. Findings indicate a small bowel obstruction. Gaseous distention of the stomach has decreased from the prior exam.    A nasogastric tube is present in the region of the stomach.    There are degenerative changes of the spine.        Impression    PERSISTENT MARKEDLY DISTENDED LOOPS OF SMALL BOWEL SUGGESTING AN OBSTRUCTION.        Radiologist location ID: [id number]     XR AP MOBILE CHEST     Status: None    Narrative  Marvens LEE Servais    RADIOLOGIST: Ileene Hutchinson    XR AP MOBILE CHEST performed on 05/18/2021 6:50 AM    CLINICAL HISTORY: Chest pain/SOB, Left pleural effusion.  resp fail-vent    TECHNIQUE: Frontal view of the chest.    COMPARISON:  Yesterday    FINDINGS:  The support lines and tubes are in stable and satisfactory position.   The heart size is normal.   Bibasilar opacities and a small left pleural effusion are unchanged.   The bones are unremarkable.        Impression    NO SIGNIFICANT CHANGE SINCE THE PRIOR EXAM.      Radiologist location ID: [id number]     XR KUB     Status: None    Narrative    Maron LEE Lutze    RADIOLOGIST: Brayton El, MD    XR KUB performed on 05/18/2021 10:21 AM.    CLINICAL HISTORY: f/u ileus.      ileus/vent    TECHNIQUE: Single view abdomen.    COMPARISON:  05/17/2021      FINDINGS:  There is diffuse gaseous distention of the small bowel and colon potentially representing ileus. An enteric tube is seen in the stomach.    There is a Foley catheter.    No suspicious calcifications.    Bibasilar atelectasis is present. Mild cardiomegaly.           Impression    Diffuse gaseous distention of the bowel suggestive of an ileus.        Radiologist location ID: [id number]     XR KUB     Status: None    Narrative    Jarod LEE Mcfarlane    RADIOLOGIST: Danne Baxter, MD    XR KUB performed on 05/19/2021 6:19 AM.    CLINICAL HISTORY: f/u ileus.  re evaluate ileus     TECHNIQUE:  Single view abdomen.    COMPARISON:  Yesterday    FINDINGS:  There is a nasogastric tube projecting in the region of the distal body of the stomach.    There are distended gas-filled loops of small bowel with a similar degree of distention. Obstruction is suspected. Ileus is in differential.    There are degenerative changes of the spine.        Impression    NO SIGNIFICANT CHANGE FROM THE PRIOR STUDY.        Radiologist location ID: [id number]     XR AP MOBILE CHEST     Status: None    Narrative    Brooke LEE Debarge    RADIOLOGIST: Danne Baxter, MD    XR AP MOBILE CHEST performed on 05/19/2021 6:20 AM    CLINICAL HISTORY: Chest pain/SOB, Left pleural effusion.  on vent     TECHNIQUE: Frontal view of the chest.    COMPARISON:  Yesterday    FINDINGS:  The support lines and tubes are in stable and satisfactory position.   The heart size is normal.   There is patchy by airspace disease with a layering left pleural effusion. Aeration is similar.           Impression    NO SIGNIFICANT CHANGE SINCE THE PRIOR EXAM.      Radiologist location ID: [id number]     XR AP MOBILE CHEST     Status: None    Narrative    Pj LEE Folmar    RADIOLOGIST: Kelby Frame    XR AP MOBILE CHEST  performed on 05/20/2021 6:09 AM    CLINICAL HISTORY: Chest pain/SOB, Left pleural effusion.  re eval effusions     TECHNIQUE: Frontal view of the chest.    COMPARISON:  Chest x-ray from one day ago    FINDINGS:  Endotracheal tube is no longer seen. NG tube tip is below the diaphragm in the stomach. PICC line tip at the distal SVC is stable   Cardiac and mediastinal contours are stable.   Patchy infiltrative bilateral changes similar to the prior exam given difference in technique. Hazy appearance left lower chest likely indicating effusion.  Left rib fractures are seen. No obvious pneumothorax           Impression    NO SIGNIFICANT CHANGE SINCE THE PRIOR EXAM. (Extubated)      Radiologist location ID: [id number]     XR AP MOBILE CHEST     Status: None     Narrative    Tobey LEE Espinal    RADIOLOGIST: Dorisann Frames, MD    XR AP MOBILE CHEST performed on 05/21/2021 12:38 PM    CLINICAL HISTORY: dobhoff tube placement.  dobhoff tube placement    TECHNIQUE: Frontal view of the chest.    COMPARISON:  Yesterday    FINDINGS:  Large body habitus degrades image quality.  The Dobbhoff tube can be seen to just below the level of the GE junction. A high KUB may be helpful for further clarifying the position of the tip.  Left PICC is noted.   Cardiac and mediastinal contours are stable.   No significant change in the appearance of the lungs.   Patchy and confluent bilateral pulmonary infiltrates persist with dense consolidation/atelectasis at the left lung base.        Impression    DOBBHOFF TUBE IS DIFFICULT TO SEE THE LOWER MARGIN OF THE IMAGE. A HIGH KUB IMAGE MAY BE HELPFUL FOR FURTHER EVALUATION.      Radiologist location ID: [id number]     XR KUB     Status: None    Narrative    Keita LEE Rogoff    RADIOLOGIST: Otho Najjar, MD    XR KUB performed on 05/21/2021 2:11 PM.    CLINICAL HISTORY: dobhoff placement.  for dobhoff tube placement.    TECHNIQUE: Single view abdomen.    COMPARISON:  None.    FINDINGS:  NG tube placement demonstrates Dobbhoff tube to be overlying the stomach appearing to be anatomically positioned.    Gas pattern is nonspecific with a single loop of what appears to be small bowel in the left mid abdomen noted.  No gross calcifications.      Impression    Anatomic positioning of NG tube.      Radiologist location ID: [id number]     FLUORO ESOPHAGRAM, MODIFIED SWALLOW     Status: None    Narrative    Antavius LEE Baucum    RADIOLOGIST: Otho Najjar, MD    FLUORO ESOPHAGRAM, MODIFIED SWALLOW performed on 05/22/2021 1:57 PM    CLINICAL HISTORY: dysphagia, cough post prolonged intubation. . .  dysphagia w/cough    TECHNIQUE:  Modified barium swallow was performed in conjunction with a member of the speech pathology department using various  consistencies of barium and lateral fluoroscopic observation.     FLUOROSCOPIC TIME:  1.1 minutes  FLUOROGRAPHIC IMAGES: 13    FINDINGS:   The patient was given thin barium, nectar thick barium, applesauce, fruit and wafers mixed with barium.  Impression    Impression:  Thin barium:  Spoon: No penetration or aspiration.  Cup: No penetration or aspiration.  Straw: Penetration.    Nectar thick barium:  Spoon: Penetration area  Cup: No penetration or aspiration.  Straw: No penetration or aspiration.    Applesauce: No aspiration or penetration.  Fruit: After liquid barium squeezed out from the fruit, slight penetration.  Wafers mixed with barium: After liquid barium squeezed out, slight penetration.    PLEASE REFER TO SPEECH PATHOLOGIST'S REPORT FOR ADDITIONAL DETAILS.      Radiologist location ID: [id number]     XR CHEST PA AND LATERAL     Status: None    Narrative    Maximiano LEE Bayless    RADIOLOGIST: Tempie Donning    XR CHEST PA AND LATERAL performed on 07/22/2021 1:49 PM    CLINICAL HISTORY: J44.9: Chronic obstructive pulmonary disease, unspecified COPD type (CMS [place])  Z87.01: History of pneumonia  F17.200: Current smoker.  cough, short of breath, recent pneumonia 05/2021, copd    TECHNIQUE: Frontal and lateral views of the chest.    COMPARISON:  05/21/2021    FINDINGS:    The heart size is normal.  The mediastinal contour is unremarkable.  There are chronic-appearing changes of both lungs. Left basilar atelectasis and/or airspace disease. Likely small left effusion. Opacity projecting over the left midlung laterally may represent callus formation at site of rib fractures.      Impression    Left basilar atelectasis and/or airspace disease with likely small left effusion. Opacity projecting over the left mid lung laterally may represent callus formation at the fracture site. As acute symptoms resolve repeat imaging is recommended.      Radiologist location ID: [id number]     Imaging:  Chest X-ray ordered to  evaluate Pneumonia,patient notified of results  Left lateral decubitus Chest X-ray ordered for the patient to evaluate Left pleural effusion    Labs: No Labs ordered    Diagnostic Test: Pulmonary Function Test ordered  Nocturnal Oximetry on room air ordered,Order faxed to [place]:   Constitutional:  Patient alert and oriented, no acute distress, no comfortable appearing.  General appearance of the patient   HEENT:  Head is normocephalic, atraumatic.  Tympanic membranes are easily visualized , transparent and pearly gray in color with normal light reflex noted bilaterally.  Nasal turbinates are pink and moist without inflammation or erythema. Nasal vestibule free of rhinorrhea. No tenderness upon palpation of sinus areas. Oral mucosa is pink and moist without lesions, no pharyngeal erythremia or exudate noted.  Neck supple without masses, no lymphadenopathy.  No jugular vein distention.  No thyromegaly.   Cardiovascular:   Regular rate, rhythm, normal S1, S2 heard, no Murmurs, rubs, or gallops auscultated.  No carotid bruit.  No ankle edema.  Lungs:  Clear to ascultation anterior and posterior, non labored respirations, no wheezes, rales or rhonchi, Thorax is symmetrical, chest walls expand equally, no pain upon palpation of upper anterior thorax, expirations not prolonged, no intercostal retractions.  Abdomen: Abdomen soft, non-distended, non-tender.  Musc: Normal gait and station, no digital cyanosis or clubbing.  Skin:  Color flesh tone.  Skin warm and dry.  No ecchymosis seen, nail beds pink, no clubbing, capillary refill less than four seconds, no rash, lesion or ulcers.   Psych:  Normal affect, interaction, and speech, cooperative, informative, pleasant.    Assessment:  (J90) Pleural effusion, left  (primary encounter diagnosis)  Plan: XR CHEST LATERAL  DECUBITUS LEFT    (J44.9) Chronic obstructive pulmonary disease, unspecified COPD type (CMS [facility])  Plan: XR CHEST PA AND LATERAL,  albuterol sulfate         (PROAIR HFA) 90 mcg/actuation Inhalation HFA         Aerosol Inhaler, PULMONARY FUNCTION         TESTING-ADULT, DME - PULSE OXIMETRY, DME -         NEBULIZER SUPPLIES AND KIT,         ipratropium-albuterol 0.5 mg-3 mg(2.5 mg         base)/3 mL Solution for Nebulization, XR CHEST         LATERAL DECUBITUS LEFT    (Z87.01) History of pneumonia  Plan: XR CHEST PA AND LATERAL, PULMONARY FUNCTION         TESTING-ADULT, XR CHEST LATERAL DECUBITUS LEFT    (F17.200) Current smoker  Plan: XR CHEST PA AND LATERAL, PULMONARY FUNCTION         TESTING-ADULT, XR CHEST LATERAL DECUBITUS LEFT    (Z71.6) Encounter for smoking cessation counseling    (Z71.85) Immunization counseling       Plan:    COPD,uncontrolled  Smoking Cessation discussed with the patient greater than 3 minutes,patient desires to stop smoking on his own accord  Will order nebulizer machine and supplies for the patient from [facility]  Will prescribe Ipratropium-Albuterol 0.72m-3mg(2.5mg base)333mthrees times a day 8 hours apart,[facility] Al[facility]erol HFA rescue inhaler,one to two puffs every 6 hours as needed for Cough,Shortness of breath or Wheezing  [facility] explore level of COPD for treatment  Continue oxygen 4 liters per nasal cannula with exertion  Will order Nocturnal Oximetry on room air,[facility]  Will order Chest X-ray:  The patient notified that his Chest x-ray shows left basilar atelectasis and or airspace disease with likely small left effusion,opacity projecting over the left mid lung laterally may represent callus formation of former fracture.  Will order Left decubitus Chest X-ray,patient is agreeable to plan of care to investigate pleural effusion,patient was instructed to use Incentive Spirometry at home at least 4 times a day    Immunization for Flu,Pneumonia and Covid counseling provided for the patient      The patient was given  the opportunity to ask questions and those questions were answered to the patient's satisfaction. The patient was encouraged to call with any additional questions or concerns.   Discussed with patient effects and side effects of medications. Medication safety was discussed  Detailed time spent for preparation of today's encounter,review of documents, interviewing/examining the patient,providing education/counselling,completing electronic orders and progress notes for the patient's plan of care.      Electronically signed by JaLindwood CokeCFNP  Pulmonary

## 2021-07-29 LAB — ECG 12 LEAD: Calculated R Axis: 17 degrees

## 2021-08-01 ENCOUNTER — Telehealth (INDEPENDENT_AMBULATORY_CARE_PROVIDER_SITE_OTHER): Payer: Self-pay | Admitting: Nurse Practitioner

## 2021-08-01 NOTE — Telephone Encounter (Signed)
THE PATIENT TELEPHONED AND A VOICEMAIL MESSAGE TO RETURN A CALL TO THIS OFFICE TODAY DISCUSS (07/24/2021) NOCTURNAL OXIMETRY RESULTS.  AWAIT RECALL FROM THE PATIENT.

## 2021-08-01 NOTE — Telephone Encounter (Signed)
THE PATIENT RECALLED THE OFFICE AND His NOCTURNAL OXIMETRY ON ROOM AIR RESULTS (07/24/2021):SHOW  28.5 MINUTES BELOW 88%.  THE PATIENT WAS INSTRUCTED TO USE His OXYGEN PER NASAL CANNULA AT NIGHT AS ORDERED WHEN He WAS DISCHARGED FROM [place] (05/29/2021)

## 2021-08-05 MED ORDER — ROSUVASTATIN CALCIUM 20 MG PO TABS: 20.0000 mg | ORAL_TABLET | Freq: Every day | ORAL | 3 refills | Status: DC

## 2021-08-13 ENCOUNTER — Other Ambulatory Visit: Payer: Self-pay

## 2021-08-13 ENCOUNTER — Encounter (INDEPENDENT_AMBULATORY_CARE_PROVIDER_SITE_OTHER): Payer: Self-pay | Admitting: Medical

## 2021-08-13 ENCOUNTER — Ambulatory Visit: Payer: MEDICAID | Attending: Medical | Admitting: Medical

## 2021-08-13 VITALS — Resp 12 | Ht 69.0 in | Wt 210.0 lb

## 2021-08-13 DIAGNOSIS — K7030 Alcoholic cirrhosis of liver without ascites: Secondary | ICD-10-CM | POA: Insufficient documentation

## 2021-08-13 DIAGNOSIS — R1310 Dysphagia, unspecified: Secondary | ICD-10-CM | POA: Insufficient documentation

## 2021-08-13 DIAGNOSIS — Z6831 Body mass index (BMI) 31.0-31.9, adult: Secondary | ICD-10-CM

## 2021-08-13 DIAGNOSIS — K625 Hemorrhage of anus and rectum: Secondary | ICD-10-CM | POA: Insufficient documentation

## 2021-08-13 MED ORDER — BISACODYL 5 MG TABLET,DELAYED RELEASE
20.0000 mg | DELAYED_RELEASE_TABLET | Freq: Once | ORAL | 0 refills | Status: AC
Start: 2021-08-13 — End: 2021-08-13

## 2021-08-13 MED ORDER — POLYETHYLENE GLYCOL 3350 17 GRAM/DOSE ORAL POWDER
ORAL | 0 refills | Status: DC
Start: 2021-08-13 — End: 2021-10-08

## 2021-08-13 NOTE — Patient Instructions (Signed)
Fiber supplement daily recommended in combination with plenty of fluids daily. (examples Benefiber, Metamucil, Citrucel). Recommend gradually increasing fiber to a total of 20-30 grams daily. (add a few grams every 4-5 days until achieving goal)  Probiotic recommended daily. (Geographical information systems officer, Florastor or [employer]Span)    Keep bowels moving well week prior to colonoscopy    Stay well hydrated prior to and during colonoscopy.    Eat light 2-3 days prior to prep day    Will schedule upper scope and colonoscopy and see you after completed    Get labs drawn before next visit    Avoid NSAIDs. Recommend low salt diet.     Watch for signs of confusion.

## 2021-08-13 NOTE — Addendum Note (Signed)
Addended by: Amadeo Garnet on: 08/13/2021 01:02 PM     Modules accepted: Level of Service

## 2021-08-13 NOTE — Progress Notes (Addendum)
GASTROENTEROLOGY, [facility] [address]  [place] [address]  Operated by [facility]     Name: Hector [name] MRN:  [id number]   Date: 08/13/2021 Age: 60 y.o.     Chief Complaint: Cirrhosis (Patient was referred for alcoholic cirrhosis. He states that he is not having any abdominal pain, N/V or changes in his bowels. Patient has never had an E/C before. Patient reports last drink was 04-2021)       Subjective:    Patient presents referred for cirrhosis    He was told this after coming out of a medically induced coma in June after suffering a fall and having breathing issues. He has not drank since May but was drinking a 30 pk of beer every 2 days. He admits to drinking for 40 years. No family history of liver disease. He denies hx of IV drug use. He does not think he has ever been checked for hepatitis and does not know if he has had the vaccines.    He has RUQ pain about once per week. No nausea or vomiting. He is gaining weight since coming out of hospital and tells me that he thinks abdomen has been getting larger over a couple of years. He is limited historian and present with his girlfriend. He has never had EGD or colonoscopy. His girlfriends says he does not go to doctor regularly aside for mental health for bipolar disorder. His girlfriend has noticed increased confusion since hospital admission.     He has dysphagia with regurgitation at times daily. Bowels move 3-4 times daily and formed to loose which is his normal. He has blood in stool occasionally. No melena. No family history of colon cancer/polyps    It appears that he had CT with contrast on 05/03/21 showing cirrhotic appearing liver. No mention of splenomegaly. Platelets were low in June but normal with last blood draw 05/20/21. MCV 102, hb 10.4. His ammonia level was elevated in June as well. In May, INR 1.13, AST 86, ALT 61, alk phos 140, normal bilirubin, albumin 4.2, serum ethanol elevated.    Past Medical  History  Current Outpatient Medications   Medication Sig   . albuterol sulfate (PROAIR HFA) 90 mcg/actuation Inhalation HFA Aerosol Inhaler Take 1-2 Puffs by inhalation Every 6 hours as needed for Other (SHORTNESS OF BREATH,COUGH,WHEEZING)   . bisacodyL (DULCOLAX) 5 mg Oral Tablet, Delayed Release (E.C.) Take 4 Tablets (20 mg total) by mouth One time for 1 dose as recommended for colonoscopy prep   . ipratropium-albuterol 0.5 mg-3 mg(2.5 mg base)/3 mL Solution for Nebulization Take 3 mL by nebulization Three times a day 8 HOURS APART AS NEEDED FOR COUGH,SHORTNESS OF BREATH,WHEEZING   . nystatin (NYSTOP) 100,000 unit/gram Powder Apply topically Twice daily   . OLANZapine (ZYPREXA) 10 mg Oral Tablet Take 10 mg by mouth Every night   . polyethylene glycol (MIRALAX) 17 gram/dose Oral Powder as recommended for colonoscopy prep   . sertraline (ZOLOFT) 100 mg Oral Tablet Take 2 Tablets (200 mg total) by mouth Once a day for 30 days   . traZODone (DESYREL) 50 mg Oral Tablet Take 50 mg by mouth Every night Unsure dosage     Allergies   Allergen Reactions   . Bee Venom Protein (Honey Bee)      Bee stings     Past Medical History:   Diagnosis Date   . Alcohol abuse    . Cirrhosis (CMS [place])    .  Delirium, withdrawal, alcoholic (CMS HCC)    . Depression    . HTN (hypertension)    . Unknown cause of injury     fx nose   . Wears glasses      Past Surgical History:   Procedure Laterality Date   . HX TONSILLECTOMY       Family Medical History:     Problem Relation (Age of Onset)    Asthma Father        Social History     Socioeconomic History   . Marital status: Divorced   Tobacco Use   . Smoking status: Current Every Day Smoker     Packs/day: 1.50     Types: Cigarettes   . Smokeless tobacco: Never Used   Vaping Use   . Vaping Use: Never used   Substance and Sexual Activity   . Alcohol use: Not Currently     Alcohol/week: 15.0 standard drinks     Types: 15 Cans of beer per week     Comment: May 2022   . Drug use: Yes     Types:  Marijuana     Comment: daily   . Sexual activity: Yes      ROS: see HPI    Objective:    Resp 12   Ht 1.753 m (_0 )   Wt 95.3 kg (210 lb)   BMI 31.01 kg/m      General: NAD, A&Ox3, poor historian  HEENT: sclera anicteric bilaterally  Heart:  Regular rate and rhythm, no murmurs  Lungs:  Clear to auscultation bilaterally, no wheezing or rhonchi  Abdomen:  Soft, nondistended, bowel sounds present x4, epigastric region/RUQ tender and LLQ mildly tender  Extremities: no lower extremity edema  Psych: no depression/anxiety    Labs:   COMPREHENSIVE METABOLIC PANEL - NON FASTING  Lab Results   Component Value Date    SODIUM 138 05/24/2021    POTASSIUM 3.8 05/24/2021    CHLORIDE 109 (H) 05/24/2021    CO2 22 05/24/2021    ANIONGAP 7 05/24/2021    BUN 6 (L) 05/24/2021    CREATININE 0.52 (L) 05/24/2021    GLUCOSENF 91 05/24/2021    CALCIUM 8.4 05/24/2021    PHOSPHORUS 4.2 05/24/2021    ALBUMIN 3.4 (L) 05/17/2021    TOTALPROTEIN 6.7 05/17/2021    ALKPHOS 148 (H) 05/17/2021    AST 41 05/17/2021    ALT 39 05/17/2021    BILIRUBINCON 0.0 05/13/2021     CBC  Diff   Lab Results   Component Value Date/Time    WBC 6.9 05/24/2021 04:31 AM    HGB 9.7 (L) 05/24/2021 04:31 AM    HCT 29.4 (L) 05/24/2021 04:31 AM    PLTCNT 119 (L) 05/24/2021 04:31 AM    RBC 2.89 (L) 05/24/2021 04:31 AM    MCV 102.0 (H) 05/24/2021 04:31 AM    MCHC 33.0 05/24/2021 04:31 AM    MCH 33.7 05/24/2021 04:31 AM    RDW 14.6 (H) 05/24/2021 04:31 AM    MPV 9.6 05/24/2021 04:31 AM    Lab Results   Component Value Date/Time    PMNS 70 (H) 05/20/2021 05:09 AM    LYMPHOCYTES 16 (L) 05/20/2021 05:09 AM    EOSINOPHIL 2 05/20/2021 05:09 AM    MONOCYTES 11 05/20/2021 05:09 AM    BASOPHILS 1 05/20/2021 05:09 AM    BASOPHILS 0.10 05/20/2021 05:09 AM    PMNABS 6.40 05/20/2021 05:09 AM    LYMPHSABS 1.40 05/20/2021 05:09 AM  EOSABS 0.20 05/20/2021 05:09 AM    MONOSABS 1.00 (H) 05/20/2021 05:09 AM        Component   Ref Range & Units 2 mo ago   (05/19/21) 2 mo ago   (05/18/21) 2 mo  ago   (05/17/21) 2 mo ago   (05/16/21) 3 mo ago   (05/15/21) 3 mo ago   (05/14/21) 3 mo ago   (05/13/21)   AMMONIA   9 - 30 umol/L 61High  68High  107High  105High  78High  42High  66High      Radiology:   Recent Results (from the past [id number] hour(s))   CT CHEST ABDOMEN PELVIS W IV CONTRAST    Collection Time: 05/03/21 10:02 AM    Narrative    Coralyn Mark LEE Durley    RADIOLOGIST: Peterson Ao    CT CHEST ABDOMEN PELVIS W IV CONTRAST performed on 05/03/2021 10:02 AM    CLINICAL HISTORY: Reassess grade 3 splenic laceration and rib fractures/pneumothorax.  increasing respiratory distress    TECHNIQUE:  Chest, abdomen and pelvis CT with intravenous contrast.  CONTRAST:  75 ml's of Isovue 300    COMPARISON:  None.  # of known CTs in the past 12 months: 0   # of known Cardiac Nuclear Medicine Studies in the past 12 months: 0    FINDINGS:    Image quality is degraded secondary to respiratory motion artifact.    CT CHEST:  Hardware:  Endotracheal tube in satisfactory position. Transesophageal catheter terminates in the stomach.    Lymph nodes:   No mediastinal, hilar, or axillary lymphadenopathy.    Heart and Vasculature:  Cardiomegaly.  No pericardial effusion. Thoracic aorta and pulmonary arteries are unremarkable.      Lungs and Airways:  Worsening bilateral lower lobe consolidation. Mild underlying emphysematous changes.    Pleura: Trace left pleural effusion. Interval resolution of the previously seen small left-sided pneumothorax.    Bones: Redemonstration of multiple displaced left-sided rib fractures. Interval decrease in the subcutaneous air in the left chest wall.      CT ABDOMEN/PELVIS:  Liver:   Nodular surface contours with hypertrophy of the caudate lobe compatible with cirrhosis    Gallbladder:   Cholelithiasis    Spleen:   The known splenic laceration is not as well visualized on the current exam. No abnormal perisplenic fluid collection is seen.    Pancreas:   Unremarkable.    Adrenals:    Unremarkable.    Kidneys:   Unremarkable.    Bladder:  Decompressed with an indwelling Foley catheter    Prostate:  Unremarkable.    Bowel:   No bowel obstruction.    Appendix:  Not visualized    Lymph nodes:  No suspicious lymph node enlargement.    Vasculature:   Mild diffuse atherosclerotic calcifications are noted.     Peritoneum / Retroperitoneum: No ascites.  No free air.    Bones:   Degenerative changes of the spine.          Impression    RESPIRATORY MOTION DEGRADED EXAM.    INTERVAL RESOLUTION OF THE PREVIOUSLY SEEN SMALL LEFT-SIDED PNEUMOTHORAX.    WORSENING BILATERAL LOWER LOBE CONSOLIDATION THAT COULD REFLECT ATELECTASIS AND/OR PNEUMONIA    REDEMONSTRATION OF MULTIPLE DISPLACED LEFT-SIDED RIB FRACTURES WITH DECREASING SUBCUTANEOUS AIR IN THE CHEST WALL.    THE KNOWN GRADE 3 SPLENIC LACERATION IS NOT AS WELL VISUALIZED ON THE CURRENT EXAM AND IS STABLE TO IMPROVED FROM THE 05/01/2021 EXAM. NO NEW ACUTE FINDINGS  IN THE ABDOMEN/PELVIS.      One or more dose reduction techniques were used (e.g., Automated exposure control, adjustment of the mA and/or kV according to patient size, use of iterative reconstruction technique).      Radiologist location ID: [id number]       Pathology:   No results found for this or any previous visit (from the past 720 hour(s)).     Problem List Items Addressed This Visit        Digestive    Alcoholic cirrhosis (CMS HCC)      Other Visit Diagnoses     Rectal bleeding    -  Primary    Dysphagia, unspecified type             Plan:   Patient was seen by myself independently with Dr Merrie Roof available for consultation.    This is a 60 year old male with a history of bipolar disorder, COPD and alcoholism.    1. cirrhosis -  - seen on CT. Labs suggestive of cirrhosis but will check complete workup  Etiology- likely alcohol.  - MELD- will check labs to calculate  - Ascites- none present  - Encephalopathy-  questionable. Girlfriend reporting some confusion since being off ventilator and  discharged  - PHTN- recommend EGD. patient agreeable  - HCC screening- US and AFP every 6 months  - Hepatitis immunity- will check for current infection and immune status    2. dysphagia  -EGD discussed with patient to assess for ulcers, inflammation, infection, etc. Risks such as anethesia, bleeding, infection, perforation were discussed. Patient is agreeable and all questions were answered.     3. rectal bleeding  -colonoscopy was discussed  to assess for polyps, masses, diverticula, hemorrhoids, etc. Risks such as anesthesia, bleeding, infection and perforation discussed. Patient is agreeable and all questions were answered. Miralax prep given.     4. smoking cessation advised    Thank you for this referral and allowing [place] to participate in this patient's care.    Return to Clinic: Return in about 6 weeks (around 09/24/2021).     Kelton Pillar, PA-C     DISCLAIMER: This note was partially created using voice recognition software which is subject to errors that may escape proof reading. Please pardon mistakes made by MModal. If typographical errors are noted and/or phrasing does not make sense, please contact me for corrections. I reserve the right to change note to correct mistakes related to MModal misinterpretation.

## 2021-08-15 HISTORY — PX: ESOPHAGOGASTRODUODENOSCOPY: SHX1529

## 2021-08-15 HISTORY — PX: COLONOSCOPY: WVUENDOPRO10

## 2021-08-23 ENCOUNTER — Telehealth (INDEPENDENT_AMBULATORY_CARE_PROVIDER_SITE_OTHER): Payer: Self-pay | Admitting: Nurse Practitioner

## 2021-08-23 NOTE — Telephone Encounter (Signed)
08-23-21 PFT [place] Tuesday 09-10-21'@2'$ :45pm. Patient notified.  [phone] Per Saralyn Pilar male no pre-cert, referral or auth. Call ref# [id number]  Hector Senate Np/Jeanette

## 2021-08-26 ENCOUNTER — Encounter (HOSPITAL_COMMUNITY): Payer: MEDICAID | Admitting: Internal Medicine

## 2021-08-26 ENCOUNTER — Other Ambulatory Visit: Payer: Self-pay

## 2021-08-26 ENCOUNTER — Encounter (HOSPITAL_COMMUNITY): Payer: Self-pay | Admitting: Internal Medicine

## 2021-08-26 ENCOUNTER — Ambulatory Visit (HOSPITAL_COMMUNITY): Payer: MEDICAID | Admitting: Anesthesiology

## 2021-08-26 ENCOUNTER — Encounter (HOSPITAL_COMMUNITY)
Admission: RE | Disposition: A | Discharge status: Home / Self Care | Payer: Self-pay | Source: Ambulatory Visit | Attending: Internal Medicine

## 2021-08-26 ENCOUNTER — Inpatient Hospital Stay
Admission: RE | Admit: 2021-08-26 | Discharge: 2021-08-26 | Disposition: A | Discharge status: Home / Self Care | Payer: MEDICAID | Source: Ambulatory Visit | Attending: Internal Medicine | Admitting: Internal Medicine

## 2021-08-26 DIAGNOSIS — R1310 Dysphagia, unspecified: Secondary | ICD-10-CM

## 2021-08-26 DIAGNOSIS — K640 First degree hemorrhoids: Secondary | ICD-10-CM

## 2021-08-26 DIAGNOSIS — K7460 Unspecified cirrhosis of liver: Secondary | ICD-10-CM | POA: Insufficient documentation

## 2021-08-26 DIAGNOSIS — D128 Benign neoplasm of rectum: Secondary | ICD-10-CM

## 2021-08-26 DIAGNOSIS — K2270 Barrett's esophagus without dysplasia: Secondary | ICD-10-CM

## 2021-08-26 DIAGNOSIS — D122 Benign neoplasm of ascending colon: Secondary | ICD-10-CM | POA: Insufficient documentation

## 2021-08-26 DIAGNOSIS — Z6831 Body mass index (BMI) 31.0-31.9, adult: Secondary | ICD-10-CM

## 2021-08-26 DIAGNOSIS — K625 Hemorrhage of anus and rectum: Secondary | ICD-10-CM | POA: Insufficient documentation

## 2021-08-26 DIAGNOSIS — K766 Portal hypertension: Secondary | ICD-10-CM | POA: Insufficient documentation

## 2021-08-26 DIAGNOSIS — K3189 Other diseases of stomach and duodenum: Secondary | ICD-10-CM | POA: Insufficient documentation

## 2021-08-26 DIAGNOSIS — D123 Benign neoplasm of transverse colon: Secondary | ICD-10-CM

## 2021-08-26 DIAGNOSIS — I8510 Secondary esophageal varices without bleeding: Secondary | ICD-10-CM | POA: Insufficient documentation

## 2021-08-26 HISTORY — DX: Unspecified coma: R40.20

## 2021-08-26 SURGERY — GASTROSCOPY WITH BIOPSY
Anesthesia: Monitor Anesthesia Care | Wound class: Clean Contaminated Wounds-The respiratory, GI, Genital, or urinary

## 2021-08-26 MED ORDER — LACTATED RINGERS INTRAVENOUS SOLUTION
INTRAVENOUS | Status: DC
Start: 2021-08-26 — End: 2021-08-26

## 2021-08-26 MED ORDER — SODIUM CHLORIDE 0.9 % (FLUSH) INJECTION SYRINGE
10.0000 mL | INJECTION | INTRAMUSCULAR | Status: DC | PRN
Start: 2021-08-26 — End: 2021-08-26

## 2021-08-26 MED ORDER — PROPOFOL 10 MG/ML IV BOLUS
INJECTION | Freq: Once | INTRAVENOUS | Status: DC | PRN
Start: 2021-08-26 — End: 2021-08-26
  Administered 2021-08-26: 70 mg via INTRAVENOUS
  Administered 2021-08-26: 30 mg via INTRAVENOUS
  Administered 2021-08-26: 70 mg via INTRAVENOUS
  Administered 2021-08-26: 40 mg via INTRAVENOUS
  Administered 2021-08-26: 100 mg via INTRAVENOUS
  Administered 2021-08-26: 40 mg via INTRAVENOUS

## 2021-08-26 MED ORDER — SODIUM CHLORIDE 0.9 % (FLUSH) INJECTION SYRINGE
10.0000 mL | INJECTION | Freq: Three times a day (TID) | INTRAMUSCULAR | Status: DC
Start: 2021-08-26 — End: 2021-08-26

## 2021-08-26 SURGICAL SUPPLY — 85 items
ADAPTER CATH 11/32IN 1/8-3/8IN PLASTIC LL SYRG CONN POS GRIP RIDGE STRL LF  3/32IN TAPER (UROLOGICAL SUPPLIES)
BASIN EME 16OZ 9X3.8X2IN GRAD_FLXB DISP DST ROSE POLYPROP (MED SURG SUPPLIES) ×2
BASIN EME 8.4X3.8X2IN GRAD DISP DST ROSE POLYPROP C500ML LF (MED SURG SUPPLIES) ×2 IMPLANT
BASKET SPEC RETR 190CMX20MM 3.7MM FLOWERBASKETV C HOOK 8WR (INSTRUMENTS ENDOMECHANICAL)
BASKET SPEC RETR 190CMX20MM 3.7MM FLOWERBASKETV C HOOK 8WR GW STRL DISP (ENDOSCOPIC SUPPLIES) IMPLANT
BLOCK BITE 54FR ELAS ADJ STRAP BLOX (ENDOSCOPIC SUPPLIES) ×1 IMPLANT
BLOCK BITE 54FR ELAS ADJ STRAP BLOX (INSTRUMENTS ENDOMECHANICAL) ×1
BRUSH CYTO 3MM 2.1MM 230CM SHEATH BRSTL POS STP THB RING (INSTRUMENTS ENDOMECHANICAL)
BRUSH CYTO 3MM 2.1MM 230CM SHEATH BRSTL POS STP THB RING CNMD STRL DISP LF  CSCP (ENDOSCOPIC SUPPLIES) IMPLANT
CAN SUCT 1200CC LF (MED SURG SUPPLIES) IMPLANT
CATH ELHMST GLD PROBE 10FR 300CM BIPOLAR RND DIST TIP FIRM SHAFT STD CONN HMGLD STRL DISP 3.7MM MN (ENDOSCOPIC SUPPLIES) IMPLANT
CATH ELHMST GLD PROBE 7FR 300CM BIPOLAR RND DIST TIP STD CONN FIRM SHAFT HMGLD STRL DISP 2.8MM MN (ENDOSCOPIC SUPPLIES) IMPLANT
CATH RGFLX II 40MM 10CM 90CM RADOPQ BAL DIL LF  ACPT .038IN GW ACHL (VASCULAR) IMPLANT
CLIP LGT RSL 360 ULTRA 235CM BRD ROT CONTROL KNOB 17MM OPN (ENDOSCOPIC SUPPLIES) IMPLANT
CLIP LGT RSL 360 ULTRA 235CM BRD ROT CONTROL KNOB 17MM OPN (INSTRUMENTS ENDOMECHANICAL)
DEVICE ENDOS CAPTIVATR STD GASTROSCP (ENDOSCOPIC SUPPLIES) IMPLANT
DEVICE ENDOS CAPTIVATR STD GASTROSCP (INSTRUMENTS ENDOMECHANICAL)
DEVICE INFLAT CRE STERI-FLATE DISP (ENDOSCOPIC SUPPLIES) IMPLANT
DEVICE IRRG BIOVAC (ENDOSCOPIC SUPPLIES) IMPLANT
DEVICE IRRG BIOVAC (INSTRUMENTS ENDOMECHANICAL)
DEVICE SUT OVERSTICH SUT CNCH 6 UNIT STRL DISP (ENDOSCOPIC SUPPLIES)
DEVICE SUT OVERSTITCH 1 HND OP_FULL THK ENDOSCP FLXB CNCH (INSTRUMENTS ENDOMECHANICAL)
DEVICE SUT OVERSTITCH 1 HND OP_UNQ CNCH FLXB ENDOSCP RLD (INSTRUMENTS ENDOMECHANICAL)
DEVICE SUT OVERSTITCH NEEDLE DRIVER ANCH EXCH STRL DISP (ENDOSCOPIC SUPPLIES) IMPLANT
DILATOR ENDOS CRE 180CM 8CM 10-11-12MM 6FR ESOPH BAL LOW PROF FIX WRE PEBAX STRL LF  DISP 2.8MM (ENDOSCOPIC SUPPLIES) IMPLANT
DILATOR ENDOS CRE 180CM 8CM 10_-11-12MM 6FR ESOPH BAL LOW (INSTRUMENTS ENDOMECHANICAL)
DILATOR ENDOS CRE 180CM 8CM 15-16.5-18MM 6FR ESPH PYL FIX (INSTRUMENTS ENDOMECHANICAL)
DILATOR ENDOS CRE 180CM 8CM 15-16.5-18MM 6FR ESPH PYL FIX WRE BAL PEBAX STRL DISP ACPT .035IN GW 2.8 (ENDOSCOPIC SUPPLIES) IMPLANT
DILATOR ENDOS CRE 180CM 8CM 6FR 12-13.5-15MM ESOPH FIX WRE BAL RND SHLDR PEBAX STRL LF  DISP (ENDOSCOPIC SUPPLIES) IMPLANT
DILATOR ENDOS CRE 180CM 8CM 6F_R 12-13.5-15MM ESOPH FIX WRE (INSTRUMENTS ENDOMECHANICAL)
DILATOR ENDOS CRE 240CM 5.5CM 11-13.5-15MM 7.5FR ESOPH PYL BIL BAL LOW PROF GW PEBAX STRL LF  DISP (ENDOSCOPIC SUPPLIES) IMPLANT
DILATOR ENDOS CRE 240CM 5.5CM 15-16.5-18MM 7.5FR ESOPH PYL BIL BAL LOW PROF GW PEBAX STRL LF  DISP (ENDOSCOPIC SUPPLIES) IMPLANT
DILATOR ENDOS CRE 240CM 5.5CM 18-19-20MM 7.5FR ESOPH PYL BIL BAL LOW PROF GW PEBAX STRL LF  DISP 2.8 (ENDOSCOPIC SUPPLIES) IMPLANT
DILATOR ENDOS CRE 240CM 5.5CM 8-9-10MM 7.5FR ESOPH PYL BIL BAL LOW PROF GW PEBAX STRL LF  DISP 2.8MM (ENDOSCOPIC SUPPLIES) IMPLANT
DILATOR ENDOS CRE 240CM 5.5CM_11-13.5-15MM 7.5FR ESOPH PYL (INSTRUMENTS ENDOMECHANICAL)
DILATOR ENDOS CRE 240CM 5.5CM_15-16.5-18MM 7.5FR ESOPH PYL (INSTRUMENTS ENDOMECHANICAL)
DILATOR ENDOS CRE 240CM 5.5CM_8-9-10MM 7.5FR ESOPH PYL BIL (INSTRUMENTS ENDOMECHANICAL)
DISC USE 106457 - SNARE SM OVAL ROT MED STF ENDOS (ENDOSCOPIC SUPPLIES) IMPLANT
DISCONTINUED USE 344090 - DEVICE SUT OVERSTICH SUT CNCH 6 UNIT STRL DISP (ENDOSCOPIC SUPPLIES) IMPLANT
DUPE USE ITEM 11496 - SYRINGE MONOJECT 60ML LF STRL LL TIP GRAD MED POLYPROP STD (MED SURG SUPPLIES) IMPLANT
DUPE USE ITEM 153319 - CLIP LGT RSL 360 ULTRA 235CM BRD ROT CONTROL KNOB 17MM OPN (ENDOSCOPIC SUPPLIES) IMPLANT
DUPE USE ITEM 162462 - ADAPTER CATH 11/32IN 1/8-3/8IN PLASTIC LL SYRG CONN POS GRIP RIDGE STRL LF  3/32IN TAPER (UROLOGICAL SUPPLIES) IMPLANT
DUPE USE ITEM 65945 - LIGATOR 2.8MM 8.6-11.5MM SSS7 STNG DPL MLD BAND ENDOS SQ LF (ENDOSCOPIC SUPPLIES) IMPLANT
ELECTRODE PATIENT RTN 9FT VLAB C30- LB RM PHSV ACRL FOAM CORD NONIRRITATE NONSENSITIZE ADH STRP (SURGICAL CUTTING SUPPLIES) IMPLANT
FORCEPS BIOPSY HOT 240CM 2.2MM RJ 4 +2.8MM DISP (ENDOSCOPIC SUPPLIES)
FORCEPS BIOPSY HOT 240CM 2.2MM RJ 4 +2.8MM DISPO (ENDOSCOPIC SUPPLIES) IMPLANT
FORCEPS BIOPSY MICROMESH TTH STREAMLINE CATH 240CM 2.4MM RJ (ENDOSCOPIC SUPPLIES) ×1 IMPLANT
FORCEPS BIOPSY MICROMESH TTH STREAMLINE CATH NEEDLE 240CM (ENDOSCOPIC SUPPLIES) IMPLANT
FORCEPS BIOPSY NEEDLE 240CM 2.2MM RJ 4 2.8MM STD CPC STRL (ENDOSCOPIC SUPPLIES) IMPLANT
FORCEPS GRSP RAT TOOTH ALGTR JAW CATH 230CM 2.4MM RSC 7MM 2.8CM MN WKG CHNL (ENDOSCOPIC SUPPLIES) IMPLANT
GOWN PROTECT XL BLU FULL BCK HKLP NK KNITCUFF SMS 49X30IN 21IN (GLOVES AND ACCESSORIES) ×4 IMPLANT
GOWN PROTECT XL BLU FULL BCK H_KLP NK KNITCUFF SMS 49X30IN 21 (GLOVES AND ACCESSORIES) ×4
GW ENDOS 80CM CLEANGUIDE 4CM OTW RADOPQ DIL SPRG TIP (INSTRUMENTS ENDOMECHANICAL)
GW ENDOS 80CM CLEANGUIDE 4CM OTW RADOPQ DIL SPRG TIP CALIBRATE PLVN ESOPH STRL (ENDOSCOPIC SUPPLIES) IMPLANT
KIT 1.1+ DE BIOPSY BX20 (INSTRUMENTS ENDOMECHANICAL) ×2
KIT DECOMPRESS 175CM 14FR 6FR .035IN COLON 10 DRAIN SDPRT CATH GW STRL DISP (MED SURG SUPPLIES) IMPLANT
KIT DECOMPRESS 175CM 14FR 6FR_.035IN COLON 10 DRAIN SDPRT (MED SURG SUPPLIES)
KIT ENDOS CMPLN ENDOKIT ORCAPOD 4 2 END 1.1OZ (ENDOSCOPIC SUPPLIES) ×1 IMPLANT
LIGATOR 2.8MM 8.6-11.5MM SSS7 STNG DPL MLD BAND ENDOS SQ LF (ENDOSCOPIC SUPPLIES)
LIGATOR 2.8MM 8.6-11.5MM SSS7_STNG DPL MLD BAND ENDOS SQ LF (INSTRUMENTS ENDOMECHANICAL)
MANIFLD SUCT NPTN 2 STD 1 PORT WASTE MGMT SYS NONST LF  DISP (MED SURG SUPPLIES) ×1 IMPLANT
NEEDLE SCLRTX 25GA 2.3MM BVL STRL DISP STAR CATH INTJCT 4MM 240CM (ENDOSCOPIC SUPPLIES) IMPLANT
NEEDLE SCLRTX 25GA 2.3MM BVL S_TRL DISP STAR CATH INTJCT 4MM (INSTRUMENTS ENDOMECHANICAL)
NET SPEC RETR 230CM 2.5MM RTHNT PLAT UNIV 4CM NONST LF  DISP (ENDOSCOPIC SUPPLIES) IMPLANT
ORCA DISPOSABLE BUTTONS (INSTRUMENTS ENDOMECHANICAL) ×2
PROBE ESURG 220CM 2.3MM FIAPC FLXB STR FIRE STRL DISP (SURGICAL CUTTING SUPPLIES) IMPLANT
SCISSOR ENDOS ENSIZOR 235CM 2.6MM (ENDOSCOPIC SUPPLIES) IMPLANT
SCISSOR ENDOS ENSIZOR 235CM 2._6MM (INSTRUMENTS ENDOMECHANICAL)
SNARE MICRO 240IN 13MM CAPTIVATR PLPCTM HEX ENDOS STRL DISP (ENDOSCOPIC SUPPLIES) ×1 IMPLANT
SNARE RND 240CM 2.4MM CAPTIVATR COLD STF THN WRE ENDOS PLPCTM 10MM DISP (ENDOSCOPIC SUPPLIES) IMPLANT
SNARE SM OVAL 240CM 2.4MM SNS LOOP SHRTHRW FLXB ENDOS PLPCTM 13MM STRL DISP (ENDOSCOPIC SUPPLIES) IMPLANT
SNARE SM OVAL ROT MED STF ENDOS (INSTRUMENTS ENDOMECHANICAL)
SNARE SURG 10MM RND COLD (INSTRUMENTS ENDOMECHANICAL)
SYRINGE 50ML LF  STRL GRAD N-PYRG DEHP-FR PVC FREE CATH TIP DISP CLR (MED SURG SUPPLIES) IMPLANT
SYRINGE 50ML LF STRL GRAD N-P_YRG DEHP-FR PVC FREE CATH TIP (MED SURG SUPPLIES)
TRAP MUCUS 40ML 5IN PLASTIC TRNSPT CAP CONTAINR DISP ADPR (MISCELLANEOUS PT CARE ITEMS)
TRAP MUCUS 40ML 5IN PLASTIC TRNSPT CAP CONTAINR DISP ADPR SUCT TUBE STRL LF (MISCELLANEOUS PT CARE ITEMS) IMPLANT
TRAP SPECI REM TRPS QD 4 REM CHAMBER SAF SCRN PARABOLA SLOT (ENDOSCOPIC SUPPLIES) ×1 IMPLANT
TRAP SPECI REM TRPS QD 4 REM C_HAMBER SAF SCRN PARABOLA SLOT (INSTRUMENTS ENDOMECHANICAL) ×1
TUBING SUCT 10FT .25IN AMSURE NONST LF (MED SURG SUPPLIES) ×2
TUBING SUCT LIGHT BLU 10FT .25IN AMSURE FEMALE CONN BPA FREE COLLAPSE RST FLXB NONST LF  DISP (MED SURG SUPPLIES) ×1 IMPLANT
USE ITEM 343174 SNARE RND 240CM 2.4MM CAPTIVATR COLD STF THN WRE ENDOS PLPCTM 10MM DISP (ENDOSCOPIC SUPPLIES) IMPLANT
USE ITEM 77895 FORCEPS BIOPSY MICROMESH TTH STREAMLINE CATH NEEDLE 240CM (ENDOSCOPIC SUPPLIES) IMPLANT
USE ITEM 82891 SNARE SM OVAL 240CM 2.4MM SNS LOOP SHRTHRW FLXB ENDOS PLPCTM 13MM STRL DISP (ENDOSCOPIC SUPPLIES) IMPLANT
VALVE SUCT ORCAPOD ORCA SEAL AIR WATER SIL FREE ENCLOSE SPRG BIOPSY OLMPS 160/180/190 SER AUX PORT (ENDOSCOPIC SUPPLIES) ×2 IMPLANT

## 2021-08-26 NOTE — Anesthesia Preprocedure Evaluation (Signed)
ANESTHESIA PRE-OP EVALUATION  Planned Procedure: EGD (N/A )  COLONOSCOPY (N/A )  Review of Systems     anesthesia history negative     patient summary reviewed  nursing notes reviewed        Pulmonary   COPD, current smoker and Smoked in last 24 hours,   Cardiovascular    Hypertension ,No peripheral edema,        GI/Hepatic/Renal    Dysphagia  etoh abuse-quit drinking may 2022 and liver disease (ascites- never drained)        Endo/Other    obesity,      Neuro/Psych/MS    depression     Cancer    negative hematology/oncology ROS,                   Physical Assessment      Airway       Mallampati: II    TM distance: >3 FB    Neck ROM: full  Mouth Opening: good.            Dental           (+) poor dentition, chipped           Pulmonary    Breath sounds clear to auscultation  (-) no rhonchi, no decreased breath sounds, no wheezes, no rales and no stridor     Cardiovascular    Rhythm: regular  Rate: Normal  (-) no friction rub, carotid bruit is not present, no peripheral edema and no murmur     Other findings            Plan  ASA 4     Planned anesthesia type: MAC                           Anesthetic plan and risks discussed with patient  Signed consent obtained            Patient's NPO status is appropriate for Anesthesia.

## 2021-08-26 NOTE — Anesthesia Postprocedure Evaluation (Signed)
Anesthesia Post Op Evaluation    Patient: Hector [name]  Procedure(s):  GASTROSCOPY WITH BIOPSY  COLONOSCOPY WITH COLD SNARE POLYPECTOMY    Last Vitals:Temperature: 36.7 C (98 F) (08/26/21 1330)  Heart Rate: 62 (08/26/21 1330)  BP (Non-Invasive): (!) 148/76 (08/26/21 1330)  Respiratory Rate: 20 (08/26/21 1330)  SpO2: 94 % (08/26/21 0000000)    No complications documented.    Patient is sufficiently recovered from the effects of anesthesia to participate in the evaluation and has returned to their pre-procedure level.  Patient location during evaluation: PACU       Patient participation: complete - patient participated  Level of consciousness: awake and alert and responsive to verbal stimuli    Pain management: adequate  Airway patency: patent    Anesthetic complications: no  Cardiovascular status: acceptable  Respiratory status: acceptable  Hydration status: acceptable  Patient post-procedure temperature: Pt Normothermic   PONV Status: Absent

## 2021-08-26 NOTE — H&P (Signed)
[facility]  GI Admission History and Physical      Hector [name]   MRN:  [id number]  Date of Birth:  [birth year]    Date of Procedure:  08/26/2021    Chief Complaint: Dysphagia and Rectal bleeding    HPI: Hector [name], Hector [name] is a 59 y.o. year old male who presents today for EGD/colonoscopy.    This procedure is being done to evaluate Dysphagia and Rectal bleeding.    The patient denies abdominal pain.    Past Medical History:   Diagnosis Date   . Alcohol abuse    . Cirrhosis (CMS [place])    . Delirium, withdrawal, alcoholic (CMS HCC)    . Depression    . HTN (hypertension)    . Unknown cause of injury     fx nose   . Wears glasses            Allergies   Allergen Reactions   . Bee Venom Protein (Honey Bee)      Bee stings       Medications Prior to Admission     Prescriptions    albuterol sulfate (PROAIR HFA) 90 mcg/actuation Inhalation HFA Aerosol Inhaler    Take 1-2 Puffs by inhalation Every 6 hours as needed for Other (SHORTNESS OF BREATH,COUGH,WHEEZING)    bisacodyL (DULCOLAX) 5 mg Oral Tablet, Delayed Release (E.C.)    Take 4 Tablets (20 mg total) by mouth One time for 1 dose as recommended for colonoscopy prep    ipratropium-albuterol 0.5 mg-3 mg(2.5 mg base)/3 mL Solution for Nebulization    Take 3 mL by nebulization Three times a day 8 HOURS APART AS NEEDED FOR COUGH,SHORTNESS OF BREATH,WHEEZING    nystatin (NYSTOP) 100,000 unit/gram Powder    Apply topically Twice daily    OLANZapine (ZYPREXA) 10 mg Oral Tablet    Take 10 mg by mouth Every night    polyethylene glycol (MIRALAX) 17 gram/dose Oral Powder    as recommended for colonoscopy prep    sertraline (ZOLOFT) 100 mg Oral Tablet    Take 2 Tablets (200 mg total) by mouth Once a day for 30 days    traZODone (DESYREL) 50 mg Oral Tablet    Take 50 mg by mouth Every night Unsure dosage           Past Surgical History:   Procedure Laterality Date   . HX TONSILLECTOMY             Physical Exam:  HEENT: Airway patent, Trachea midline, No large goiters or neck  lymphadenopathy  Heart: Regular rate and rhythm, no murmurs  Lungs: Clear to auscultation bilaterally without decreased breath sounds, rhonchi, or wheezes  Abdomen: Soft, nondistended, positive bowel sounds, nontender      Assessment:  Dysphagia and Rectal bleeding    Plan:  Proceed with colonoscopy.     Orders Placed This Encounter   . INSERT & MAINTAIN PERIPHERAL IV ACCESS   . PERIPHERAL IV DRESSING CHANGE   . INSERT & MAINTAIN PERIPHERAL IV ACCESS   . PERIPHERAL IV DRESSING CHANGE   . NS flush syringe   . NS flush syringe   . LR premix infusion   . NS flush syringe   . NS flush syringe         Thamas Jaegers, MD

## 2021-08-26 NOTE — Anesthesia Transfer of Care (Signed)
ANESTHESIA TRANSFER OF CARE   Hector [name] is a 60 y.o. ,male, Weight: 95.3 kg (210 lb)   had Procedure(s):  GASTROSCOPY WITH BIOPSY  COLONOSCOPY WITH COLD SNARE POLYPECTOMY  performed  08/26/21   Primary Service: Thamas Jaegers, MD    Past Medical History:   Diagnosis Date   . Alcohol abuse    . Cirrhosis (CMS [place])    . Coma (CMS [facility])     after fall due to [facility] May 2022   . Delirium, withdrawal, alcoholic (CMS HCC)    . Depression    . HTN (hypertension)    . Unknown cause of injury     fx nose   . Wears glasses       Allergy History as of 08/26/21     BEE VENOM PROTEIN (HONEY BEE)       Noted Status Severity Type Reaction    05/01/21 1124 Isa Rankin, LPN 54/62/70 Active       Comments: Bee stings               I completed my transfer of care / handoff to the receiving personnel during which we discussed:  Access, Airway, All key/critical aspects of case discussed, Analgesia, Antibiotics, Expectation of post procedure, Fluids/Product, Gave opportunity for questions and acknowledgement of understanding, Labs and PMHx    Post Location: PACU                                       Report given to: Bernita Raisin, RN                           Last OR Temp: Temperature: 36.6 C (97.9 F)  ABG:  PH (ARTERIAL)   Date Value Ref Range Status   05/19/2021 7.43 7.35 - 7.45 Final     PH (T)   Date Value Ref Range Status   05/19/2021 7.42 7.35 - 7.45 Final     PCO2 (ARTERIAL)   Date Value Ref Range Status   05/19/2021 29 (L) 35 - 45 mm/Hg Final     PO2 (ARTERIAL)   Date Value Ref Range Status   05/19/2021 70 (L) 72 - 100 mm/Hg Final     SODIUM   Date Value Ref Range Status   05/19/2021 142 137 - 145 mmol/L Final     POTASSIUM   Date Value Ref Range Status   05/24/2021 3.8 3.5 - 5.1 mmol/L Final     KETONES   Date Value Ref Range Status   05/17/2021 Negative Negative mg/dL Final     WHOLE BLOOD POTASSIUM   Date Value Ref Range Status   05/19/2021 4.3 3.5 - 4.6 mmol/L Final     CHLORIDE   Date Value Ref Range Status    05/19/2021 115 (H) 101 - 111 mmol/L Final     CALCIUM   Date Value Ref Range Status   05/24/2021 8.4 8.4 - 10.2 mg/dL Final     Calculated P Axis   Date Value Ref Range Status   05/01/2021 90 degrees Final     Calculated R Axis   Date Value Ref Range Status   05/14/2021 17 degrees Corrected     Calculated T Axis   Date Value Ref Range Status   05/14/2021 18 degrees Corrected     IONIZED CALCIUM   Date Value Ref  Range Status   05/19/2021 1.22 1.10 - 1.35 mmol/L Final     LACTATE   Date Value Ref Range Status   05/19/2021 0.8 0.0 - 1.3 mmol/L Final     HEMOGLOBIN   Date Value Ref Range Status   05/19/2021 10.3 (L) 12.0 - 18.0 g/dL Final     OXYHEMOGLOBIN   Date Value Ref Range Status   05/19/2021 92.8 85.0 - 98.0 % Final     CARBOXYHEMOGLOBIN   Date Value Ref Range Status   05/19/2021 2.1 0.0 - 2.5 % Final     MET-HEMOGLOBIN   Date Value Ref Range Status   05/19/2021 1.3 0.0 - 2.0 % Final     BASE EXCESS (ARTERIAL)   Date Value Ref Range Status   05/17/2021 0.1 0.0 - 1.0 mmol/L Final     BASE DEFICIT   Date Value Ref Range Status   05/19/2021 4.2 (H) 0.0 - 3.0 mmol/L Final     BICARBONATE (ARTERIAL)   Date Value Ref Range Status   05/19/2021 21.6 18.0 - 26.0 mmol/L Final     TEMPERATURE, COMP   Date Value Ref Range Status   05/19/2021 37.0   C Final     Airway:* No LDAs found *  Blood pressure (!) 114/56, pulse 66, temperature 36.6 C (97.9 F), resp. rate (!) 24, height 1.753 m (5' 9" ), weight 95.3 kg (210 lb), SpO2 98 %.

## 2021-08-28 LAB — SURGICAL PATHOLOGY SPECIMEN

## 2021-09-04 ENCOUNTER — Other Ambulatory Visit: Payer: Self-pay

## 2021-09-04 ENCOUNTER — Ambulatory Visit (INDEPENDENT_AMBULATORY_CARE_PROVIDER_SITE_OTHER)
Admission: RE | Admit: 2021-09-04 | Discharge: 2021-09-04 | Disposition: A | Discharge status: Home / Self Care | Payer: MEDICAID | Source: Ambulatory Visit

## 2021-09-04 ENCOUNTER — Ambulatory Visit: Payer: MEDICAID | Attending: Internal Medicine

## 2021-09-04 DIAGNOSIS — J90 Pleural effusion, not elsewhere classified: Secondary | ICD-10-CM

## 2021-09-04 DIAGNOSIS — J449 Chronic obstructive pulmonary disease, unspecified: Secondary | ICD-10-CM | POA: Insufficient documentation

## 2021-09-04 DIAGNOSIS — F17200 Nicotine dependence, unspecified, uncomplicated: Secondary | ICD-10-CM

## 2021-09-04 DIAGNOSIS — Z8701 Personal history of pneumonia (recurrent): Secondary | ICD-10-CM | POA: Insufficient documentation

## 2021-09-04 DIAGNOSIS — K7030 Alcoholic cirrhosis of liver without ascites: Secondary | ICD-10-CM | POA: Insufficient documentation

## 2021-09-06 ENCOUNTER — Ambulatory Visit: Payer: MEDICAID | Attending: Medical

## 2021-09-06 ENCOUNTER — Other Ambulatory Visit: Payer: Self-pay

## 2021-09-06 DIAGNOSIS — K7030 Alcoholic cirrhosis of liver without ascites: Secondary | ICD-10-CM | POA: Insufficient documentation

## 2021-09-06 LAB — COMPREHENSIVE METABOLIC PANEL, NON-FASTING
ALBUMIN/GLOBULIN RATIO: 1.1 — ABNORMAL LOW (ref 1.5–2.5)
ALBUMIN: 4.1 g/dL (ref 3.5–5.0)
ALKALINE PHOSPHATASE: 118 U/L (ref 38–126)
ALT (SGPT): 26 U/L (ref <50)
ANION GAP: 9 mmol/L (ref 5–19)
AST (SGOT): 41 U/L (ref 17–59)
BILIRUBIN TOTAL: 0.8 mg/dL (ref 0.2–1.3)
BUN/CREA RATIO: 18 (ref 6–20)
BUN: 14 mg/dL (ref 9–20)
CALCIUM: 9.2 mg/dL (ref 8.4–10.2)
CHLORIDE: 109 mmol/L — ABNORMAL HIGH (ref 98–107)
CO2 TOTAL: 22 mmol/L (ref 22–30)
CREATININE: 0.76 mg/dL (ref 0.66–1.20)
ESTIMATED GFR: >60 mL/min/{1.73_m2} (ref >60)
GLUCOSE: 107 mg/dL — ABNORMAL HIGH (ref 74–106)
POTASSIUM: 4.2 mmol/L (ref 3.5–5.1)
PROTEIN TOTAL: 7.7 g/dL (ref 6.3–8.2)
SODIUM: 140 mmol/L (ref 137–145)

## 2021-09-06 LAB — HEPATITIS B SURFACE ANTIBODY
HBV SURFACE ANTIBODY QUANTITATIVE: <4 m[IU]/mL
HBV SURFACE ANTIBODY: NEGATIVE

## 2021-09-06 LAB — CBC
HCT: 40 % (ref 36.0–46.0)
HGB: 13.8 g/dL — ABNORMAL LOW (ref 13.9–16.3)
MCH: 31.8 pg (ref 25.4–34.0)
MCHC: 34.5 g/dL (ref 30.0–37.0)
MCV: 92.2 fL (ref 80.0–100.0)
MPV: 9.2 fL (ref 7.5–11.5)
PLATELETS: 67 10*3/uL — ABNORMAL LOW (ref 130–400)
RBC: 4.34 10*6/uL (ref 4.30–5.90)
RDW: 16.1 % — ABNORMAL HIGH (ref 11.5–14.0)
WBC: 3.8 10*3/uL — ABNORMAL LOW (ref 4.5–11.5)

## 2021-09-06 LAB — PT/INR
INR: 1.13 — ABNORMAL HIGH (ref 0.87–1.10)
PROTHROMBIN TIME: 11.6 seconds (ref 9.0–12.0)

## 2021-09-06 LAB — LIPID PANEL
CHOLESTEROL: 193 mg/dL (ref 0–200)
HDL CHOL: 33 mg/dL — ABNORMAL LOW (ref >40)
LDL CALC: 140 mg/dL — ABNORMAL HIGH (ref <100)
TRIGLYCERIDES: 98 mg/dL (ref <150)

## 2021-09-06 LAB — HEPATITIS B CORE IGM, AB: HBV CORE IGM ANTIBODY QUALITATIVE: NEGATIVE

## 2021-09-06 LAB — CERULOPLASMIN: CERULOPLASMIN: 28 mg/dL (ref 18–45)

## 2021-09-06 LAB — IMMUNOGLOBULIN G (IGG), SERUM: IMMUNOGLOBULIN G (IGG): 1439 mg/dL (ref 700–1600)

## 2021-09-06 LAB — FERRITIN: FERRITIN: 80 ng/mL (ref 18–464)

## 2021-09-06 LAB — IRON TRANSFERRIN AND TIBC
IRON (TRANSFERRIN) SATURATION: 22 % (ref 11–46)
IRON: 74 ug/dL (ref 49–181)
TOTAL IRON BINDING CAPACITY: 344 ug/dL (ref 261–462)

## 2021-09-06 LAB — ALPHA FETOPROTEIN (AFP) TUMOR MARKER: AFP TUMOR MARKER: 6 ng/mL (ref <=9)

## 2021-09-06 LAB — ALPHA-1-ANTITRYPSIN: ALPHA 1 ANTITRYPSIN: 187 mg/dL (ref 90–200)

## 2021-09-06 LAB — HEPATITIS C ANTIBODY SCREEN WITH REFLEX TO HCV PCR: HCV ANTIBODY QUALITATIVE: NEGATIVE

## 2021-09-06 LAB — HEPATITIS A (HAV) IGM ANTIBODY: HAV IGM: NEGATIVE

## 2021-09-06 LAB — HEPATITIS A (HAV) IGG ANTIBODY: HAV IGG ANTIBODY: NEGATIVE

## 2021-09-06 LAB — HEPATITIS B SURFACE ANTIGEN: HBV SURFACE ANTIGEN QUALITATIVE: NEGATIVE

## 2021-09-09 ENCOUNTER — Encounter: Payer: Self-pay | Admitting: Family Medicine

## 2021-09-09 ENCOUNTER — Telehealth (INDEPENDENT_AMBULATORY_CARE_PROVIDER_SITE_OTHER): Payer: Self-pay | Admitting: Nurse Practitioner

## 2021-09-09 LAB — HEP-2 SUBSTRATE ANTINUCLEAR ANTIBODIES (ANA), SERUM: ANA INTERPRETATION: NEGATIVE

## 2021-09-09 LAB — SM (SMITH) ANTIBODIES, IGG, SERUM: SM ANTIBODIES, IGG, QUALITATIVE: NEGATIVE

## 2021-09-09 NOTE — Telephone Encounter (Signed)
THE PATIENT NOTIFIED THAT His (09/04/2021) LEFT DECUBITUS CHEST X-RAY RESULTS SHOW A TINY AMOUNT OF FREE FLUID ON LEFT WITH LESS THAN A CENTIMETER OF THICKNESS.  THE PATIENT REPORTS NO INCREASED SHORTNESS OF BREAT AND CONTINUE TO SMOKE CIGARETTES DAILY.  THE PATIENT AGREES TO KEEP His (10/22/2021) FOLLOW-UP APPOINTMENT AND WILL REVISIT BREATHING PATTERN AT THAT TIME. THE PATIENT WAS ENCOURAGED TO STOP SMOKING.

## 2021-09-10 ENCOUNTER — Other Ambulatory Visit: Payer: Self-pay

## 2021-09-10 ENCOUNTER — Inpatient Hospital Stay
Admission: RE | Admit: 2021-09-10 | Discharge: 2021-09-10 | Disposition: A | Discharge status: Home / Self Care | Payer: MEDICAID | Source: Ambulatory Visit | Attending: Nurse Practitioner | Admitting: Nurse Practitioner

## 2021-09-10 DIAGNOSIS — Z8701 Personal history of pneumonia (recurrent): Secondary | ICD-10-CM | POA: Insufficient documentation

## 2021-09-10 DIAGNOSIS — F17200 Nicotine dependence, unspecified, uncomplicated: Secondary | ICD-10-CM | POA: Insufficient documentation

## 2021-09-10 DIAGNOSIS — J449 Chronic obstructive pulmonary disease, unspecified: Secondary | ICD-10-CM | POA: Insufficient documentation

## 2021-09-10 LAB — MITOCHONDRIAL ANTIBODIES (M2), SERUM: MITOCHONDRIA M2 ANTIBODY (IGG), EIA: 20.8 U — ABNORMAL HIGH (ref <=20.0)

## 2021-09-10 MED ORDER — LEVALBUTEROL 1.25 MG/3 ML SOLUTION FOR NEBULIZATION
INHALATION_SOLUTION | RESPIRATORY_TRACT | Status: AC
Start: 2021-09-10 — End: 2021-09-10
  Filled 2021-09-10: qty 1

## 2021-09-10 MED ORDER — LEVALBUTEROL 1.25 MG/3 ML SOLUTION FOR NEBULIZATION
1.2500 mg | INHALATION_SOLUTION | Freq: Once | RESPIRATORY_TRACT | Status: AC
Start: 2021-09-10 — End: 2021-09-10
  Administered 2021-09-10: 1.25 mg via RESPIRATORY_TRACT

## 2021-09-10 NOTE — Procedures (Signed)
PFT 4  ORDERING PHYSICIAN:  Okey Regal, NP  DATE OF INTERPRETATION:  Tuesday September 10, 2021.  INTERPRETING PHYSICIAN:  Corky Crafts, M.D.      INDICATIONS FOR TEST:  1.   COPD    COMMENTS:  1.     History of smoking 1 pack of cigarettes per day for 48 years; no stop date reported.    2.     The diffusion was corrected for hemoglobin and the hemoglobin was 14.3 gm/dl.    3.     The flow volume loop was reviewed.  4.     Height is 68 IN and weight is 216 LB.  5.     The current study has no prior studies available for comparison..    SPIROMETRY ANALYSIS:  1.     The forced vital capacity was 3.42 L which was 77% of predicted and mildly reduced.    2.     The FEV1 was 2.59 L, which was 78% of predicted and mildly reduced.             3.     The FEV1/FVC ratio was 75.91%, which is normal.    4.     The FEF25-75% was 2.02 liters/second which was 73% of predicted.      5.      A bronchodilator was given but there was no significant improvement in the FEV1.             LUNG VOLUME ANALYSIS:  1.     The total lung capacity measured by plethysmography was 5.85 L, which was 88% of predicated and normal.     2.     The residual volume was 2.25 L which was 104% of predicted and normal.      DIFFUSION CAPACITY ANALYSIS:  1.     The diffusion corrected for hemoglobin was 15.16 mL/min/mmHg, which was 59% of predicted and moderately reduced.     2.     The diffusion adjusted for alveolar volume was 2.63 mL/min/mmHg/L, which was 62% of predicted and mildly to borderline moderately reduced.       AIRWAY RESISTANCE:  1.     The airway resistance is 0.57 cm/water/L/sec which is 39% of predicted.     FINAL IMPRESSION:  1. THERE IS A VERY MILD AND PROPORTIONATE REDUCTION IN BOTH THE FORCED VITAL CAPACITY AND THE FEV1 HOWEVER THE FEV1/FVC RATIO IS NORMAL THEREFORE THERE IS NO GROSS EVIDENCE OF OBSTRUCTIVE DISEASE PRESENT.    2. THERE IS NO EVIDENCE OF HYPERINFLATION.    3. THERE IS NO SIGNIFICANT IMPROVEMENT IN THE FEV1 WITH A  BRONCHODILATOR.    4. THERE IS NO EVIDENCE OF RESTRICTIVE LUNG DISEASE PRESENT.    5. THE DIFFUSION CORRECTED FOR HEMOGLOBIN IS MODERATELY REDUCED AT 59% OF PREDICTED.    6. THE DIFFUSION ADJUSTED FOR ALVEOLAR VOLUME IS MILDLY TO BORDERLINE MODERATELY REDUCED AT 62% OF PREDICTED.        ________________________________________  Corky Crafts, M.D.      Board Certified in Internal Medicine, Pulmonary, and Sleep Medicine

## 2021-09-11 ENCOUNTER — Encounter: Payer: Self-pay | Admitting: Family Medicine

## 2021-09-16 LAB — LIVER FIBROSIS, FIBROTEST PNL
ALPHA-2-MACROGLOBULIN: 390 mg/dL — ABNORMAL HIGH (ref 106–279)
ALT: 19 U/L (ref 9–46)
APOLIPOPROTEIN A1: 107 mg/dL (ref 94–176)
FIBROSIS SCORE: 0.89
GGT: 165 U/L — ABNORMAL HIGH (ref 3–70)
HAPTOGLOBIN: 79 mg/dL (ref 43–212)
NECROINFLAMMAT ACT SCORE: 0.16
REFERENCE ID: 4047869
TOTAL BILIRUBIN: 0.7 mg/dL (ref 0.2–1.2)

## 2021-09-18 ENCOUNTER — Encounter (INDEPENDENT_AMBULATORY_CARE_PROVIDER_SITE_OTHER): Payer: Self-pay | Admitting: Internal Medicine

## 2021-09-22 NOTE — Progress Notes (Signed)
[place] at [facility] [address], [place], [place] [address] [phone]  Date:  09/25/2021   Name:  Curtis Luna   DOB:  1961/02/13   MRN:  [id number]  PCP:  Darreld Mclean, MD    Chief Complaint: No chief complaint on file.   History of Present Illness:  Curtis Luna is a 60 y.o. very pleasant male patient who presents with the following:  Changed to a virtual visit today due to concern of sore throat  Pt location is home, my location is office  Pt seen today to discuss recent MRI of his neck - MRI done at [facility] on 8/11-ordered by Dr. Herma Mering 1.  No acute findings or high-grade canal stenosis. Multilevel degenerative change as above.  2.  Mild canal stenosis at each level C3-4, C4-5 and C5-6.  3.  Severe foraminal stenosis on the right at C5-6.   Last seen by myself in March - history of peripheral neuropathy, reflux, diverticulosis, CAD, ocular migraine headache, hyperlipidemia   At this time his main concern is more headaches- he has noted an increase in his headaches over the last 2 months   He does have a history of ocular migraine for 15 years- this last occurred 3 weeks ago, not unusual for him   He is now getting a headache maybe once a week- at the worse they were occurring daily He got a new pillow and has changes the position which he thinks may be reducing his headaches  We did get an MRI/MRA of his brain last year due to family history of aneurysm-normal  He does admit to symptoms of allergies-sneezing, runny nose and itchy nose He is taking generic zyrtec otc for allergies  He has year round allergies - seem to be getting worse as he ages    BP Readings from Last 3 Encounters:  06/13/21 116/76  04/02/21 113/67  02/18/21 132/80    Patient Active Problem List   Diagnosis Date Noted   Allergic reaction to vaccine 11/12/2020   Toe pain, right 08/23/2018   Pain of left heel 06/27/2018   Multiple  thyroid nodules 11/05/2015   Peripheral neuropathy 04/18/2015   Vertigo 04/15/2015   FH: brain aneurysm 04/15/2015   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   Seasonal affective disorder ([place]) 10/02/2014   Internal hemorrhoid 04/20/2014   Chronic low back pain 04/20/2014   Anxiety 04/20/2014   Esophageal reflux 04/04/2011   Diverticulosis of colon (without mention of hemorrhage) 04/04/2011    Past Medical History:  Diagnosis Date   Abdominal pain, right lower quadrant    Acute bronchospasm    Allergy    Anxiety    on and off   Backache, unspecified    Chicken pox    Diverticulosis of colon (without mention of hemorrhage)    Esophageal reflux    External hemorrhoids without mention of complication    thrombosed   Hiatal hernia    Hyperlipidemia    no medicines    Inguinal hernia without mention of obstruction or gangrene, unilateral or unspecified, (not specified as recurrent)    Migraine    Obstructive sleep apnea (adult) (pediatric)    Other symptoms involving digestive system(787.99)    Pain in joint, forearm    Pain in joint, shoulder region    Seasonal allergies    Sleep apnea    no cpap but uses dental device    Thoracic  or lumbosacral neuritis or radiculitis, unspecified     Past Surgical History:  Procedure Laterality Date   COLON RESECTION  10/06   sigmoid colon   COLONOSCOPY     INGUINAL HERNIA REPAIR  12/2012   Left   UPPER GASTROINTESTINAL ENDOSCOPY     WISDOM TOOTH EXTRACTION      Social History   Tobacco Use   Smoking status: Former    Types: Cigarettes, Cigars    Quit date: 10/04/1987    Years since quitting: 33.9   Smokeless tobacco: Never  Vaping Use   Vaping Use: Never used  Substance Use Topics   Alcohol use: Yes    Alcohol/week: 7.0 - 14.0 standard drinks    Types: 7 - 14 Standard drinks or equivalent per week    Comment: 2 daily    Drug use: No    Family History  Problem Relation Age of Onset   Other Father         MVA   Lung cancer Mother 76       Deceased   Diabetes Maternal Aunt    Heart disease Paternal Grandmother    Heart attack Paternal Grandmother    Healthy Brother        x2   Aneurysm Sister        Brain   Allergies Daughter        x2   Migraines Daughter        x1   Colon cancer Neg Hx    Colon polyps Neg Hx    Esophageal cancer Neg Hx    Rectal cancer Neg Hx    Stomach cancer Neg Hx    Pancreatic cancer Neg Hx    Allergic rhinitis Neg Hx    Angioedema Neg Hx    Asthma Neg Hx    Atopy Neg Hx    Eczema Neg Hx    Immunodeficiency Neg Hx    Urticaria Neg Hx     Allergies  Allergen Reactions   Escitalopram Hives    Pt felt anxious  Pt felt his skin was "on fire"   Protonix [Pantoprazole Sodium] Other (See Comments)    Tingling of extremities    Medication list has been reviewed and updated.  Current Outpatient Medications on File Prior to Visit  Medication Sig Dispense Refill   Ascorbic Acid (VITAMIN C) 1000 MG tablet Take 1,000 mg by mouth daily.     aspirin EC 81 MG tablet Take 1 tablet (81 mg total) by mouth daily. Swallow whole. (Patient not taking: Reported on 06/13/2021) 90 tablet 3   b complex vitamins tablet Take 1 tablet by mouth daily. Every other day     celecoxib (CELEBREX) 200 MG capsule Take 200 mg by mouth daily as needed.     cholecalciferol (VITAMIN D3) 25 MCG (1000 UT) tablet Take 1,000 Units by mouth daily.     esomeprazole (NEXIUM) 40 MG capsule Take 1 capsule (40 mg total) by mouth daily before breakfast. 90 capsule 3   gabapentin (NEURONTIN) 100 MG capsule TAKE 1 CAPSULE BY MOUTH 3  TIMES DAILY 270 capsule 3   LORazepam (ATIVAN) 1 MG tablet Take 1 tablet (1 mg total) by mouth as needed for anxiety. May take 1 per day 30 tablet 0   Multiple Vitamin (MULTIVITAMIN) tablet Take 1 tablet by mouth daily. Men's 50 plus multivitamin daily     rosuvastatin (CRESTOR) 20 MG tablet Take 1 tablet (20 mg total) by mouth daily. 90 tablet 3  No current  facility-administered medications on file prior to visit.    Review of Systems:  As per HPI- otherwise negative.   Physical Examination: There were no vitals filed for this visit. There were no vitals filed for this visit. There is no height or weight on file to calculate BMI. Ideal Body Weight:    Pt observed via video monitor  He looks well, his normal self.  Not currently checking vitals at home   Assessment and Plan: Environmental allergies - Plan: Ambulatory referral to Allergy  Chronic nonintractable headache, unspecified headache type Virtual visit today for concern of headaches.  Patient notes a long history of ocular migraines, these are stable.  He has been bothered more recently by headaches nearly every day.  However, over the last several weeks this is improved since he had a new pillow  He does have some symptoms of allergies, wonder if this may contribute to his headaches.  He would be interested in having allergy testing so I placed a referral we also discussed to continue seeing neurology for his headaches, for the time being he would like to pursue the allergy pathway first  Will let me know if any worsening or other concerns  Video used for duration of visit today  Signed Lamar Blinks, MD

## 2021-09-25 ENCOUNTER — Telehealth (INDEPENDENT_AMBULATORY_CARE_PROVIDER_SITE_OTHER): Payer: 59 | Admitting: Family Medicine

## 2021-09-25 ENCOUNTER — Other Ambulatory Visit: Payer: Self-pay

## 2021-09-25 ENCOUNTER — Encounter (INDEPENDENT_AMBULATORY_CARE_PROVIDER_SITE_OTHER): Payer: Self-pay | Admitting: Internal Medicine

## 2021-09-25 ENCOUNTER — Ambulatory Visit: Payer: MEDICAID | Attending: Internal Medicine | Admitting: Internal Medicine

## 2021-09-25 VITALS — BP 124/68 | HR 69 | Resp 20 | Ht 69.0 in | Wt 217.0 lb

## 2021-09-25 DIAGNOSIS — K7030 Alcoholic cirrhosis of liver without ascites: Secondary | ICD-10-CM

## 2021-09-25 DIAGNOSIS — F17210 Nicotine dependence, cigarettes, uncomplicated: Secondary | ICD-10-CM

## 2021-09-25 DIAGNOSIS — Z0000 Encounter for general adult medical examination without abnormal findings: Secondary | ICD-10-CM | POA: Insufficient documentation

## 2021-09-25 DIAGNOSIS — Z6832 Body mass index (BMI) 32.0-32.9, adult: Secondary | ICD-10-CM

## 2021-09-25 DIAGNOSIS — E785 Hyperlipidemia, unspecified: Secondary | ICD-10-CM

## 2021-09-25 DIAGNOSIS — F99 Mental disorder, not otherwise specified: Secondary | ICD-10-CM | POA: Insufficient documentation

## 2021-09-25 DIAGNOSIS — F17200 Nicotine dependence, unspecified, uncomplicated: Secondary | ICD-10-CM

## 2021-09-25 DIAGNOSIS — G8929 Other chronic pain: Secondary | ICD-10-CM

## 2021-09-25 DIAGNOSIS — R519 Headache, unspecified: Secondary | ICD-10-CM | POA: Diagnosis not present

## 2021-09-25 DIAGNOSIS — Z9109 Other allergy status, other than to drugs and biological substances: Secondary | ICD-10-CM | POA: Diagnosis not present

## 2021-09-25 NOTE — Patient Instructions (Signed)
Continue same meds

## 2021-09-25 NOTE — Progress Notes (Signed)
INTERNAL MEDICINE, [place] [address]  Phone: [phone]  Fax: [phone]    Encounter Date: 09/25/2021    Patient ID:  Hector [name]  VOH:[id number]    DOB: January 26, 1961  Age: 60 y.o. male    Progress Note    Subjective:     Chief Complaint   Patient presents with   . Follow Up 6 Months     Patient here along with girlfriend Hector [name], states he is doing ok but having L sided abdominal pain x one week,            HPI  regular checkup,  gets tired easily    Current Outpatient Medications   Medication Sig   . albuterol sulfate (PROAIR HFA) 90 mcg/actuation Inhalation HFA Aerosol Inhaler Take 1-2 Puffs by inhalation Every 6 hours as needed for Other (SHORTNESS OF BREATH,COUGH,WHEEZING)   . ipratropium-albuterol 0.5 mg-3 mg(2.5 mg base)/3 mL Solution for Nebulization Take 3 mL by nebulization Three times a day 8 HOURS APART AS NEEDED FOR COUGH,SHORTNESS OF BREATH,WHEEZING   . nystatin (NYSTOP) 100,000 unit/gram Powder Apply topically Twice daily   . OLANZapine (ZYPREXA) 10 mg Oral Tablet Take 10 mg by mouth Every night   . polyethylene glycol (MIRALAX) 17 gram/dose Oral Powder as recommended for colonoscopy prep (Patient not taking: Reported on 09/25/2021)   . sertraline (ZOLOFT) 100 mg Oral Tablet Take 2 Tablets (200 mg total) by mouth Once a day for 30 days   . traZODone (DESYREL) 50 mg Oral Tablet Take 50 mg by mouth Every night Unsure dosage     Allergies   Allergen Reactions   . Bee Venom Protein (Honey Bee)      Bee stings     Past Medical History:   Diagnosis Date   . Alcohol abuse    . Cirrhosis (CMS [place])    . Coma (CMS [facility])     after fall due to [facility] May 2022   . Delirium, withdrawal, alcoholic (CMS HCC)    . Depression    . HTN (hypertension)    . Unknown cause of injury     fx nose   . Wears glasses          Past Surgical History:   Procedure Laterality Date   . HX TONSILLECTOMY           Family Medical History:     Problem Relation (Age of Onset)    Asthma Father     Cerebral Aneurysm Sister          Social History     Tobacco Use   . Smoking status: Every Day     Packs/day: 1.50     Types: Cigarettes   . Smokeless tobacco: Never   Vaping Use   . Vaping Use: Never used   Substance Use Topics   . Alcohol use: Not Currently     Alcohol/week: 15.0 standard drinks     Types: 15 Cans of beer per week     Comment:  may 17th, 2022    . Drug use: Yes     Types: Marijuana     Comment:  a joint or 2 a day        Review of Systems    Patient denies any weight loss or weight gain     No cough or wheezing    No chest pain palpitation    He complains of some left lower quadrant abdominal tenderness no  change in bowel habits he has chronic constipation also admits that he has been eating nuts    Denies any urinary complaints    Patient has history of mental disorder on medication currently under the care of mental health specialist    Objective:   Vitals: BP 124/68 (Site: Right, Patient Position: Sitting, Cuff Size: Adult)   Pulse 69   Resp 20   Ht 1.753 m (5\' 9" )   Wt 98.4 kg (217 lb)   SpO2 95%   BMI 32.05 kg/m         Physical Exam    Patient alert oriented x3     Neck negative bruits no thyromegaly    Lungs clear on auscultation    Heart S1-S2 no murmur    Abdomen soft of 80s nontender no mass palpated    Extremities no pedal edema    Ordered and affect appear normal    Assessment & Plan:   Assessment:  ENCOUNTER DIAGNOSES     ICD-10-CM   1. Alcoholic cirrhosis of liver without ascites (CMS HCC)  K70.30   2. Tobacco use disorder  F17.200   3. Mental disorder  F99   4. Dyslipidemia  E78.5   5. Wellness examination  Z00.00       Plan:  Reviewed his pulmonary function test which did not show any obstruction or restriction.  In spite of that I told him to stop smoking for helped with patches medication he refused at this time    Follow up with the his mental health specialist as scheduled    Patient had hyperlipidemia does not follow diet he eats a lot of ice cream as per his friend.  I  told him to decrease the ice cream intake to improve his cholesterol     A now on diet and exercise at least 150 minutes a week    I have reviewed vitals, flowsheets, screening tests and visit documentation of annual wellness visit and concur with clinical staff on their assessments. I have examined the patient and discussed the findings with the patient/family. Answered all their questions and concerns.  Patient/family have been advised about complaince with age appropriate screening tests and vaccinations. Counseled regarding healthy diet/habits/activities .  Risk factors were discussed and plan of care explained . Written prevention plan completed/updated and given to patient. Relevant education materials given.  Follow-up as needed/documented in chart.    Jenkins Rouge, MD      Return in about 6 months (around 03/26/2022).      Jenkins Rouge, MD  09/25/2021, 14:30    This note was partially created using M*Modal fluency direct system (voice recognition software ) and is inherently subject to errors including those of syntax and "sound- alike" substitutions which may escape proofreading.  In such instances, original meaning may be extrapolated by contextual derivation.

## 2021-09-29 ENCOUNTER — Encounter: Payer: Self-pay | Admitting: Family Medicine

## 2021-09-29 MED ORDER — AMOXICILLIN 500 MG PO CAPS
1000.0000 mg | ORAL_CAPSULE | Freq: Two times a day (BID) | ORAL | 0 refills | Status: DC
  Filled 2021-09-29: qty 40, 10d supply, fill #0

## 2021-09-30 ENCOUNTER — Other Ambulatory Visit (HOSPITAL_BASED_OUTPATIENT_CLINIC_OR_DEPARTMENT_OTHER): Payer: Self-pay

## 2021-10-08 ENCOUNTER — Encounter (INDEPENDENT_AMBULATORY_CARE_PROVIDER_SITE_OTHER): Payer: Self-pay | Admitting: Medical

## 2021-10-08 ENCOUNTER — Other Ambulatory Visit: Payer: Self-pay

## 2021-10-08 ENCOUNTER — Ambulatory Visit: Payer: MEDICAID | Attending: Medical | Admitting: Medical

## 2021-10-08 VITALS — BP 136/72 | HR 79 | Resp 12 | Ht 69.0 in | Wt 217.0 lb

## 2021-10-08 DIAGNOSIS — Z6832 Body mass index (BMI) 32.0-32.9, adult: Secondary | ICD-10-CM

## 2021-10-08 DIAGNOSIS — K7030 Alcoholic cirrhosis of liver without ascites: Secondary | ICD-10-CM | POA: Insufficient documentation

## 2021-10-08 DIAGNOSIS — R1310 Dysphagia, unspecified: Secondary | ICD-10-CM | POA: Insufficient documentation

## 2021-10-08 DIAGNOSIS — D369 Benign neoplasm, unspecified site: Secondary | ICD-10-CM

## 2021-10-08 DIAGNOSIS — K2270 Barrett's esophagus without dysplasia: Secondary | ICD-10-CM

## 2021-10-08 MED ORDER — PANTOPRAZOLE 40 MG TABLET,DELAYED RELEASE
40.0000 mg | DELAYED_RELEASE_TABLET | Freq: Two times a day (BID) | ORAL | 3 refills | Status: DC
Start: 2021-10-08 — End: 2022-03-07

## 2021-10-08 NOTE — Patient Instructions (Addendum)
Low salt diet    Avoid NSAIDs    Our office will call for ultrasound appointment.    Get lab work done about 1 week before next visit    Start pantoprazole twice per day about 30 minutes before meals    Reflux diet discussed. Avoid fatty/greasy foods, spicy foods, alcohol, chocolate, caffeine, red sauces. Elevate head of bed 4-6 inches. Do not eat 2-3 hours before bed time.     Get hepatitis A and B immunizations

## 2021-10-08 NOTE — Progress Notes (Signed)
GASTROENTEROLOGY, [facility] [address]  [place] [address]  Operated by [facility]     Name: Hector [name] MRN:  [id number]   Date: 10/08/2021 Age: 60 y.o.     Chief Complaint: After Procedure Follow Up (Patient is following up to E/C on 08-26-2021. He denies any more rectal bleeding or dysphagia. )     Subjective:   Patient presents for follow up regarding cirrhosis    Dysphagia is gone. Mental status is about the same per his girlfriend. He has some left sided pain over the weekend. Pain has improved some. He has chest pain with cough.  He has cut done on the amount he smokes. He is not drinking alcohol. He has gained 7 pounds but is eating large amounts of ice cream    Blood work confirms suspicion of cirrhosis.     Past Medical History  Current Outpatient Medications   Medication Sig   . albuterol sulfate (PROAIR HFA) 90 mcg/actuation Inhalation HFA Aerosol Inhaler Take 1-2 Puffs by inhalation Every 6 hours as needed for Other (SHORTNESS OF BREATH,COUGH,WHEEZING)   . ipratropium-albuterol 0.5 mg-3 mg(2.5 mg base)/3 mL Solution for Nebulization Take 3 mL by nebulization Three times a day 8 HOURS APART AS NEEDED FOR COUGH,SHORTNESS OF BREATH,WHEEZING   . nystatin (NYSTOP) 100,000 unit/gram Powder Apply topically Twice daily   . OLANZapine (ZYPREXA) 10 mg Oral Tablet Take 10 mg by mouth Every night   . pantoprazole (PROTONIX) 40 mg Oral Tablet, Delayed Release (E.C.) Take 1 Tablet (40 mg total) by mouth Twice a day before meals for 90 days Take 30 minutes before breakfast   . sertraline (ZOLOFT) 100 mg Oral Tablet Take 2 Tablets (200 mg total) by mouth Once a day for 30 days   . traZODone (DESYREL) 50 mg Oral Tablet Take 50 mg by mouth Every night Unsure dosage     Past Medical History:   Diagnosis Date   . Alcohol abuse    . Cirrhosis (CMS [place])    . Coma (CMS [facility])     after fall due to [facility] May 2022   . Delirium, withdrawal, alcoholic (CMS HCC)    . Depression    . HTN  (hypertension)    . Unknown cause of injury     fx nose   . Wears glasses          Social History     Socioeconomic History   . Marital status: Divorced   Tobacco Use   . Smoking status: Every Day     Packs/day: 1.50     Types: Cigarettes   . Smokeless tobacco: Never   Vaping Use   . Vaping Use: Never used   Substance and Sexual Activity   . Alcohol use: Not Currently     Alcohol/week: 15.0 standard drinks     Types: 15 Cans of beer per week     Comment:  may 17th, 2022    . Drug use: Yes     Types: Marijuana     Comment:  a joint or 2 a day    . Sexual activity: Yes        ROS: see HPI    Objective:    BP 136/72   Pulse 79   Resp 12   Ht 1.753 m (5\' 9" )   Wt 98.4 kg (217 lb)   BMI 32.05 kg/m     General: A&Ox3, no acute distress, has baseline confusion (not worse  per girlfriend)  HEENT: sclera anicteric  Heart: RRR, no murmurs  Lungs: clear, no wheezing or rhonchi  Abdomen:  soft, bowel sounds present x 4, RUQ/epigastric region tender, fluid wave negative  Extremities: no lower extremity edema  Psych: no depression/anxiety    Labs:     Radiology:      Pathology:  Final Diagnosis  A. Ascending Colon Polyps x 2: 1 FRAGMENT OF TUBULAR ADENOMA AND 1 MINUTE FRAGMENT  OF LARGE BOWEL MUCOSA SHOWING NONSPECIFIC DISTORTION OF THE CRYPT  ARCHITECTURAL PATTERN.  B. Transverse Colon Polyps x 3: MULTIPLE FRAGMENTS OF TUBULAR ADENOMA.  C. Rectal Polyp: TUBULAR ADENOMA.  D. Lower Esophagus Biopsies: ESOPHAGEAL SQUAMOUS MUCOSA WITH REACTIVE CHANGES.  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  THE BIOPSIES ARE INDEFINITE FOR DYSPLASIA.  Electronically signed by Charlsie Quest, MD on 08/28/2021 at 1112  Problem List Items Addressed This Visit        Digestive    Alcoholic cirrhosis (CMS [place])    Relevant Orders    [place] RT UPPER QUADRANT   Other Visit Diagnoses     Barrett esophagus    -  Primary    Tubular adenoma        Dysphagia, unspecified type               Plan:   The patient was seen independently by myself  and discussed with Dr. Merrie Roof.    1. cirrhosis  - etiology- alcohol  - MELD- 9 based on labs dated 09/06/21  - ascites- none appreciated on exam. Titrate lasix and aldactone as kidneys will tolerate. Low salt diet. Avoid NSAIDs  - encephalopathy- none present.  - PHTN- last EGD 08/26/21- grade 1 esophageal varices. Repeat EGD in 1 year  - [place] screening- last imaging 05/03/21. last AFP 6 (09/06/21). Repeat every 6 months. Will obtain US  - immunizations- will need Hepatitis A and B vaccine. Can get through PCP or health department    2. dysphagia with EGD showing Barretts esophagus  - dysphagia resolved  - EGD 08/26/21- grade 1 varicices, large inlet patch in upper esophagus, 1cm tongue of salmon colored mucosa in lower esophagus, diffuse portal gastropathy. Esophageal biopsy- goblet cell metaplasia, indefinite for dysplasia  - will start Protonix 40mg  BID  - repeat EGD in 1 year    3. rectal bleeding  - resolved  -colonoscopy 08/26/21- Fair prep. Two 3-5 mm polyps in ascending colon-tubular adenoma and 1 minute fragment nonspecific crypt distortion; three 5-9 mm polyps in transverse colon- tubular adenomas; 55mm polyp in rectum- tubular adenoma.  - repeat colonoscopy in 2 years    Return to Clinic: Return in about 6 months (around 04/08/2022).    Kelton Pillar, PA-C     DISCLAIMER: This note was partially created using voice recognition software which is subject to errors that may escape proof reading. Please pardon mistakes made by MModal. If typographical errors are noted and/or phrasing does not make sense, please contact me for corrections. I reserve the right to change note to correct mistakes related to MModal misinterpretation.

## 2021-10-21 ENCOUNTER — Other Ambulatory Visit: Payer: Self-pay

## 2021-10-21 ENCOUNTER — Inpatient Hospital Stay
Admission: RE | Admit: 2021-10-21 | Discharge: 2021-10-21 | Disposition: A | Discharge status: Home / Self Care | Payer: MEDICAID | Source: Ambulatory Visit | Attending: Medical | Admitting: Medical

## 2021-10-21 DIAGNOSIS — K7030 Alcoholic cirrhosis of liver without ascites: Secondary | ICD-10-CM

## 2021-10-22 ENCOUNTER — Encounter: Payer: Self-pay | Admitting: Family Medicine

## 2021-10-22 ENCOUNTER — Ambulatory Visit (INDEPENDENT_AMBULATORY_CARE_PROVIDER_SITE_OTHER): Payer: MEDICAID | Admitting: Nurse Practitioner

## 2021-10-22 ENCOUNTER — Inpatient Hospital Stay
Admission: RE | Admit: 2021-10-22 | Discharge: 2021-10-22 | Disposition: A | Discharge status: Home / Self Care | Payer: MEDICAID | Source: Ambulatory Visit | Attending: Medical | Admitting: Medical

## 2021-10-22 ENCOUNTER — Encounter (INDEPENDENT_AMBULATORY_CARE_PROVIDER_SITE_OTHER): Payer: Self-pay | Admitting: Nurse Practitioner

## 2021-10-22 VITALS — BP 120/78 | HR 68 | Temp 98.6°F | Resp 18 | Ht 69.0 in | Wt 228.0 lb

## 2021-10-22 DIAGNOSIS — Z712 Person consulting for explanation of examination or test findings: Secondary | ICD-10-CM

## 2021-10-22 DIAGNOSIS — Z6833 Body mass index (BMI) 33.0-33.9, adult: Secondary | ICD-10-CM

## 2021-10-22 DIAGNOSIS — K7030 Alcoholic cirrhosis of liver without ascites: Secondary | ICD-10-CM | POA: Insufficient documentation

## 2021-10-22 DIAGNOSIS — J90 Pleural effusion, not elsewhere classified: Secondary | ICD-10-CM

## 2021-10-22 DIAGNOSIS — Z9981 Dependence on supplemental oxygen: Secondary | ICD-10-CM

## 2021-10-22 DIAGNOSIS — F17200 Nicotine dependence, unspecified, uncomplicated: Secondary | ICD-10-CM

## 2021-10-22 DIAGNOSIS — R0609 Other forms of dyspnea: Secondary | ICD-10-CM

## 2021-10-22 DIAGNOSIS — Z8709 Personal history of other diseases of the respiratory system: Secondary | ICD-10-CM

## 2021-10-22 MED ORDER — FLUTICASONE PROPIONATE 115 MCG-SALMETEROL 21 MCG/ACTUATION HFA INHALER
2.0000 | INHALATION_SPRAY | Freq: Two times a day (BID) | RESPIRATORY_TRACT | 0 refills | Status: DC
Start: 2021-10-22 — End: 2021-11-21

## 2021-10-22 NOTE — Progress Notes (Signed)
PULMONOLOGY, [facility]  [place] [address]      Pulmonary Clinic Follow Up    Patient Name: Hector [name]  Date: 10/22/2021  Department:  PULMONOLOGY, [place]  MRN: [id number]  DOB: 01/10/1961     Follow Up 3 Months (3 month follow up.  Patient reports sharp pain in the left side, underneath his breast, does happen on both sides,more on the left, occurs with coughing and taking deep breaths).    History of Present Illness: The patient presents to the office today for a three month follow-up visit for COPD.  The patient presents to the office today to discuss Pulmonary Function Test and 6 minute walk test results.  The patient had Pulmonary Function Test (09/10/2021):  Very ild and proportionate reduction in both the forced vital capacity and the FEV1 however the FEV1/FVC ratio is normal,therefore there is not gross evidence of obstructive disease,no evidence of hyperinflation,no significant improvement in the FEV1 with a bronchodilator,no evidence of restrictive lung disease present,Diffusion corrected for hemoglobin moderately reduced at 59% of predicted,Diffusion adjusted for alveolar volume mildly to borderline moderately reduced at 62% of predicted.  The patient reports shortness of breath and rest and with exertion,Duoneb treatments twice a day,Albuterol rescue inhaler use daily.  The patient had a Nocturnal oximetry test (07/24/2021):  28.5 minutes below 88% oximetry,patient was ordered oxygen 2 liters per nasal cannula at night.  The patient states that he feels oxygen helps him,sleeps 8 to 10 hours per night.  The patient admits that he continues to smoke one pack cigarettes daily.  The patient has a history of pleural effusion,Left Decubitus Chest X-ray (09/04/2021):  Tiny amount of free fluid on the left with less than a centimeter of thickness.  CTA of Chest (05/10/2021):  No pulmonary embolus.    Medications    Current Outpatient  Medications:     albuterol sulfate (PROAIR HFA) 90 mcg/actuation Inhalation HFA Aerosol Inhaler, Take 1-2 Puffs by inhalation Every 6 hours as needed for Other (SHORTNESS OF BREATH,COUGH,WHEEZING), Disp: 8.5 g, Rfl: 5    fluticasone propion-salmeteroL (ADVAIR) 115-21 mcg/actuation Inhalation oral inhaler, Take 2 Puffs by inhalation Every 12 hours, Disp: 12 g, Rfl: 0    ipratropium-albuterol 0.5 mg-3 mg(2.5 mg base)/3 mL Solution for Nebulization, Take 3 mL by nebulization Three times a day 8 HOURS APART AS NEEDED FOR COUGH,SHORTNESS OF BREATH,WHEEZING, Disp: 90 Each, Rfl: 5    nystatin (NYSTOP) 100,000 unit/gram Powder, Apply topically Twice daily (Patient not taking: Reported on 10/22/2021), Disp: , Rfl:     OLANZapine (ZYPREXA) 10 mg Oral Tablet, Take 10 mg by mouth Every night, Disp: , Rfl:     pantoprazole (PROTONIX) 40 mg Oral Tablet, Delayed Release (E.C.), Take 1 Tablet (40 mg total) by mouth Twice a day before meals for 90 days Take 30 minutes before breakfast, Disp: 60 Tablet, Rfl: 3    sertraline (ZOLOFT) 100 mg Oral Tablet, Take 2 Tablets (200 mg total) by mouth Once a day for 30 days, Disp: 30 Tablet, Rfl: 0    traZODone (DESYREL) 50 mg Oral Tablet, Take 50 mg by mouth Every night Unsure dosage, Disp: , Rfl:   Allergies  Allergies   Allergen Reactions    Bee Venom Protein (Honey Bee)      Bee stings      Vitals  Vitals:    10/22/21 1300   BP: 120/78   Pulse: 68   Resp: 18  Temp: 37 C (98.6 F)   SpO2: (!) 87%   Weight: 103 kg (228 lb)   Height: 1.753 m (5\' 9" )   BMI: 33.74      There are no exam notes on file for this visit.   Past Medical History  Past Medical History:   Diagnosis Date    Alcohol abuse     Cirrhosis (CMS [facility])     Coma (CMS [facility])     after fall due to alcholism May 2022    Delirium, withdrawal, alcoholic (CMS [facility])     Depression     HTN (hypertension)     Unknown cause of injury     fx nose    Wears glasses          Past Surgical History:   Procedure Laterality Date     COLONOSCOPY      COLONOSCOPY WITH COLD SNARE POLYPECTOMY N/A 08/26/2021    Performed by Thamas Jaegers, MD at [place] N/A 08/26/2021    Performed by Thamas Jaegers, MD at [facility] History:     Problem Relation (Age of Onset)    Asthma Father    Cerebral Aneurysm Sister          Social History     Socioeconomic History    Marital status: Divorced   Tobacco Use    Smoking status: Every Day     Packs/day: 1.00     Types: Cigarettes    Smokeless tobacco: Never   Vaping Use    Vaping Use: Never used   Substance and Sexual Activity    Alcohol use: Not Currently     Alcohol/week: 15.0 standard drinks     Types: 15 Cans of beer per week     Comment:  may 17th, 2022     Drug use: Yes     Types: Marijuana     Comment:  a joint or 2 a day     Sexual activity: Yes      Outpatient Medications:  Current Outpatient Medications   Medication Sig    albuterol sulfate (PROAIR HFA) 90 mcg/actuation Inhalation HFA Aerosol Inhaler Take 1-2 Puffs by inhalation Every 6 hours as needed for Other (SHORTNESS OF BREATH,COUGH,WHEEZING)    fluticasone propion-salmeteroL (ADVAIR) 115-21 mcg/actuation Inhalation oral inhaler Take 2 Puffs by inhalation Every 12 hours    ipratropium-albuterol 0.5 mg-3 mg(2.5 mg base)/3 mL Solution for Nebulization Take 3 mL by nebulization Three times a day 8 HOURS APART AS NEEDED FOR COUGH,SHORTNESS OF BREATH,WHEEZING    nystatin (NYSTOP) 100,000 unit/gram Powder Apply topically Twice daily (Patient not taking: Reported on 10/22/2021)    OLANZapine (ZYPREXA) 10 mg Oral Tablet Take 10 mg by mouth Every night    pantoprazole (PROTONIX) 40 mg Oral Tablet, Delayed Release (E.C.) Take 1 Tablet (40 mg total) by mouth Twice a day before meals for 90 days Take 30 minutes before breakfast    sertraline (ZOLOFT) 100 mg Oral Tablet Take 2 Tablets (200 mg total) by mouth Once a day for 30 days    traZODone  (DESYREL) 50 mg Oral Tablet Take 50 mg by mouth Every night Unsure dosage       Review of system:  General:  No fever, chills, fatigue, weight gain, weight loss, appetite change.  No cold sweats at night.  Cardiac:  No chest pain, pressure, palpitations, hands or ankle edema.  Respiratory:  Positive shortness of breath,Positive cough,Positive wheezing.  No hemoptysis.    Neurological:  No headache or dizziness.  HEENT:  No visual changes,Positive nasal congestion and post nasal drainage.  No neck stiffness, sore throat or swollen glands.  GI:  No dysphagia, nausea, vomiting, constipation, diarrhea.  No blood in stool.  Urinary:  No blood in urine.  Skin:  No rash.    Do you have shortness of breath? Yes  Do you have shortness of breath on exertion? Yes  Do you  have a cough? Yes  Is your cough productive? Yes  Do you have wheezing? Yes  Do you use oxygen? Yes 2 liters per nasal cannula  What is your Howard? [place]  Do you use a nebulizer? Yes  How many times a day do you use your nebulizer? Twice a day  Are you mobile at home or exercise? Yes     Imaging:     Recent Results (from the past 2160 hour(s))   XR CHEST LATERAL DECUBITUS LEFT     Status: None    Narrative    Brianna LEE Fraiser    RADIOLOGIST: Otho Najjar, MD    XR CHEST LATERAL DECUBITUS LEFT performed on 09/04/2021 11:44 AM    CLINICAL HISTORY: J44.9: Chronic obstructive pulmonary disease, unspecified COPD type (CMS [facility])  Z87.01: History of pneumonia  F17.200: Current smoker  J90: Pleural effusion, left.  PT STATES RECHECK OF FLUID IN LT LUNG AFTER FALLING IN JUNE AND FX'ING LT RIBS/CONTINUES TO HAVE DOE AND COUGH    TECHNIQUE:  Left side down decubitus view(s) of the chest    COMPARISON: None.    FINDINGS:   There is a tiny amount of free fluid on the left with less than a centimeter of thickness.        Impression    Small left pleural effusion.      Radiologist location ID: [id number]     [place] RT UPPER QUADRANT     Status: None     Narrative    Danial LEE Champagne    RADIOLOGIST: Jacob Sechrist    [place] RT UPPER QUADRANT performed on 10/22/2021 10:24 AM    CLINICAL HISTORY: K70.30: Alcoholic cirrhosis, unspecified whether ascites present (CMS [place]).  Cirrhosis    COMPARISON:  CT chest, abdomen, and pelvis dated 05/03/2021.    FINDINGS:  Liver: Enlarged, heterogeneous. Nodular liver contour.    Gallbladder:  Cholelithiasis. No wall thickening or tenderness.    Common bile duct:  Normal measuring  2.23 mm.    Pancreas: Visualized portions are sonographically unremarkable.    Visualized portions of the right kidney are unremarkable.  No right upper quadrant ascites.        Impression    Cirrhotic liver morphology. No suspicious hepatic masses.    Cholelithiasis.        Radiologist location ID: [id number]     Imaging:  No X-rays ordered    Labs: No Labs ordered    Diagnostic Test: No diagnostic tests ordered    PHYSICAL EXAMINATION:   Constitutional:  Patient alert and oriented, no acute distress, no comfortable appearing.  General appearance of the patient   HEENT:  Head is normocephalic, atraumatic.  Tympanic membranes are easily visualized , transparent and pearly gray in color with normal light reflex noted bilaterally.  Nasal turbinates are pink and moist without inflammation or erythema.  Nasal vestibule  shows evidence of nicotine stain mucosa  Nasal vestibule free of rhinorrhea. No tenderness upon palpation of sinus areas. Oral mucosa is pink and moist without lesions, no pharyngeal erythremia or exudate noted.  Trachea midline Neck supple without masses, no lymphadenopathy.  No jugular vein distention.  No thyromegaly.   Cardiovascular:   Regular rate, rhythm, normal S1, S2 heard, no Murmurs, rubs, or gallops auscultated.  No carotid bruit.  No ankle edema.  Lungs:  Clear to ascultation anterior and posterior, non labored respirations, no wheezes, rales or rhonchi, Thorax is symmetrical, chest walls expand equally, no pain upon palpation of upper  anterior thorax, expirations  prolonged, no intercostal retractions.Effort good  Abdomen: Abdomen soft, non-distended, non-tender.  Musc: Normal gait and station, no digital cyanosis or clubbing.  Skin:  Color flesh tone.  Skin warm and dry.  No ecchymosis seen, nail beds pink, no clubbing, capillary refill less than four seconds, no rash, lesion or ulcers.   Psych:  Normal affect, interaction, and speech, cooperative, informative, pleasant.    Assessment:  (R06.09) Dyspnea on exertion  (primary encounter diagnosis)  Plan: fluticasone propion-salmeteroL (ADVAIR) 115-21         mcg/actuation Inhalation oral inhaler    (F17.200) Current smoker  Plan: fluticasone propion-salmeteroL (ADVAIR) 115-21         mcg/actuation Inhalation oral inhaler    (Z87.09) History of respiratory failure  Plan: fluticasone propion-salmeteroL (ADVAIR) 115-21         mcg/actuation Inhalation oral inhaler    (J90) Pleural effusion, left    (Z99.81) Supplemental oxygen dependent    (Z71.2) Encounter to discuss test results       Plan:    Dyspnea,History of respiratory failure,current smoker  Pulmonary Function Test,Nocturnal oximetry test results discussed with the patient  The patient provided Smoking Cessation,risk of smoking on well-being and progression of breathing problems,patient desires to smoke daily  The patient had no obstructive lung disease noted on Pulmonary Function Test,however,the patient is agreeable to try long-acting bronchodilator Advair for one month and recall the office with an update  The patient had a Pulse oximetry on room air today 87%,the patient was encouraged to use his oxygen supplement,recheck of pulse oximetry 91% on room air  Continue nebulizer treatments,use of Albuterol rescue inhaler  Continue oxygen supplement  Pulmonary Function test results faxed to the patient's primary care provider for review of decreased diffusion 59% and 62%,patient may need a cardiac referral from primary care provider      The  patient was given the opportunity to ask questions and those questions were answered to the patient's satisfaction. The patient was encouraged to call with any additional questions or concerns.   Discussed with patient effects and side effects of medications. Medication safety was discussed  Detailed time spent for preparation of today's encounter,review of documents, interviewing/examining the patient,providing education/counselling,completing electronic orders and progress notes for the patient's plan of care.      Electronically signed by Lindwood Coke, CFNP  Pulmonary

## 2021-11-07 ENCOUNTER — Other Ambulatory Visit: Payer: Self-pay | Admitting: Family Medicine

## 2021-11-07 DIAGNOSIS — G629 Polyneuropathy, unspecified: Secondary | ICD-10-CM

## 2021-11-21 ENCOUNTER — Other Ambulatory Visit (INDEPENDENT_AMBULATORY_CARE_PROVIDER_SITE_OTHER): Payer: Self-pay | Admitting: Nurse Practitioner

## 2021-11-21 DIAGNOSIS — Z8709 Personal history of other diseases of the respiratory system: Secondary | ICD-10-CM

## 2021-11-21 DIAGNOSIS — R0609 Other forms of dyspnea: Secondary | ICD-10-CM

## 2021-11-21 DIAGNOSIS — F17200 Nicotine dependence, unspecified, uncomplicated: Secondary | ICD-10-CM

## 2021-11-23 NOTE — Progress Notes (Deleted)
[place] at [facility] [address] [place], [place] [address] [phone]  Date:  11/27/2021   Name:  Curtis Luna   DOB:  01/12/1961   MRN:  [id number]  PCP:  Darreld Mclean, MD    Chief Complaint: No chief complaint on file.   History of Present Illness:  Curtis Luna is a 60 y.o. very pleasant male patient who presents with the following:  Patient seen today with concern of possible vocal cord strain history of peripheral neuropathy, reflux, diverticulosis, CAD, ocular migraine headache, hyperlipidemia Most recently seen by myself for virtual visit in October to discuss allergies  He is now seeing an allergist, Dr.Bobbitt   He has a history of allergic reaction to flu vaccines and as such has been hesitant to receive other immunizations  Patient Active Problem List   Diagnosis Date Noted   Allergic reaction to vaccine 11/12/2020   Toe pain, right 08/23/2018   Pain of left heel 06/27/2018   Multiple thyroid nodules 11/05/2015   Peripheral neuropathy 04/18/2015   Vertigo 04/15/2015   FH: brain aneurysm 04/15/2015   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   Seasonal affective disorder ([place]) 10/02/2014   Internal hemorrhoid 04/20/2014   Chronic low back pain 04/20/2014   Anxiety 04/20/2014   Esophageal reflux 04/04/2011   Diverticulosis of colon (without mention of hemorrhage) 04/04/2011    Past Medical History:  Diagnosis Date   Abdominal pain, right lower quadrant    Acute bronchospasm    Allergy    Anxiety    on and off   Backache, unspecified    Chicken pox    Diverticulosis of colon (without mention of hemorrhage)    Esophageal reflux    External hemorrhoids without mention of complication    thrombosed   Hiatal hernia    Hyperlipidemia    no medicines    Inguinal hernia without mention of obstruction or gangrene, unilateral or unspecified, (not specified as recurrent)    Migraine     Obstructive sleep apnea (adult) (pediatric)    Other symptoms involving digestive system(787.99)    Pain in joint, forearm    Pain in joint, shoulder region    Seasonal allergies    Sleep apnea    no cpap but uses dental device    Thoracic or lumbosacral neuritis or radiculitis, unspecified     Past Surgical History:  Procedure Laterality Date   COLON RESECTION  10/06   sigmoid colon   COLONOSCOPY     INGUINAL HERNIA REPAIR  12/2012   Left   UPPER GASTROINTESTINAL ENDOSCOPY     WISDOM TOOTH EXTRACTION      Social History   Tobacco Use   Smoking status: Former    Types: Cigarettes, Cigars    Quit date: 10/04/1987    Years since quitting: 34.1   Smokeless tobacco: Never  Vaping Use   Vaping Use: Never used  Substance Use Topics   Alcohol use: Yes    Alcohol/week: 7.0 - 14.0 standard drinks    Types: 7 - 14 Standard drinks or equivalent per week    Comment: 2 daily    Drug use: No    Family History  Problem Relation Age of Onset   Other Father        MVA   Lung cancer Mother 2       Deceased   Diabetes Maternal Aunt    Heart disease Paternal  Grandmother    Heart attack Paternal Grandmother    Healthy Brother        x2   Aneurysm Sister        Brain   Allergies Daughter        x2   Migraines Daughter        x1   Colon cancer Neg Hx    Colon polyps Neg Hx    Esophageal cancer Neg Hx    Rectal cancer Neg Hx    Stomach cancer Neg Hx    Pancreatic cancer Neg Hx    Allergic rhinitis Neg Hx    Angioedema Neg Hx    Asthma Neg Hx    Atopy Neg Hx    Eczema Neg Hx    Immunodeficiency Neg Hx    Urticaria Neg Hx     Allergies  Allergen Reactions   Escitalopram Hives    Pt felt anxious  Pt felt his skin was "on fire"   Protonix [Pantoprazole Sodium] Other (See Comments)    Tingling of extremities    Medication list has been reviewed and updated.  Current Outpatient Medications on File Prior to Visit  Medication Sig Dispense Refill   amoxicillin  (AMOXIL) 500 MG capsule Take 2 capsules (1,000 mg total) by mouth 2 (two) times daily. 40 capsule 0   Ascorbic Acid (VITAMIN C) 1000 MG tablet Take 1,000 mg by mouth daily.     aspirin EC 81 MG tablet Take 1 tablet (81 mg total) by mouth daily. Swallow whole. (Patient not taking: Reported on 06/13/2021) 90 tablet 3   b complex vitamins tablet Take 1 tablet by mouth daily. Every other day     celecoxib (CELEBREX) 200 MG capsule Take 200 mg by mouth daily as needed.     cholecalciferol (VITAMIN D3) 25 MCG (1000 UT) tablet Take 1,000 Units by mouth daily.     esomeprazole (NEXIUM) 40 MG capsule Take 1 capsule (40 mg total) by mouth daily before breakfast. 90 capsule 3   gabapentin (NEURONTIN) 100 MG capsule TAKE 1 CAPSULE BY MOUTH 3  TIMES DAILY 270 capsule 1   LORazepam (ATIVAN) 1 MG tablet Take 1 tablet (1 mg total) by mouth as needed for anxiety. May take 1 per day 30 tablet 0   Multiple Vitamin (MULTIVITAMIN) tablet Take 1 tablet by mouth daily. Men's 50 plus multivitamin daily     rosuvastatin (CRESTOR) 20 MG tablet Take 1 tablet (20 mg total) by mouth daily. 90 tablet 3   No current facility-administered medications on file prior to visit.    Review of Systems:  As per HPI- otherwise negative.   Physical Examination: There were no vitals filed for this visit. There were no vitals filed for this visit. There is no height or weight on file to calculate BMI. Ideal Body Weight:    GEN: no acute distress. HEENT: Atraumatic, Normocephalic.  Ears and Nose: No external deformity. CV: RRR, No M/G/R. No JVD. No thrill. No extra heart sounds. PULM: CTA B, no wheezes, crackles, rhonchi. No retractions. No resp. distress. No accessory muscle use. ABD: S, NT, ND, +BS. No rebound. No HSM. EXTR: No c/c/e PSYCH: Normally interactive. Conversant.    Assessment and Plan: ***  Signed Lamar Blinks, MD

## 2021-11-24 ENCOUNTER — Encounter: Payer: Self-pay | Admitting: Family Medicine

## 2021-11-25 ENCOUNTER — Ambulatory Visit (INDEPENDENT_AMBULATORY_CARE_PROVIDER_SITE_OTHER): Payer: 59 | Admitting: Family Medicine

## 2021-11-25 ENCOUNTER — Other Ambulatory Visit (HOSPITAL_BASED_OUTPATIENT_CLINIC_OR_DEPARTMENT_OTHER): Payer: Self-pay

## 2021-11-25 VITALS — BP 110/60 | HR 96 | Temp 99.9°F | Resp 18 | Ht 65.0 in | Wt 156.0 lb

## 2021-11-25 DIAGNOSIS — R509 Fever, unspecified: Secondary | ICD-10-CM | POA: Diagnosis not present

## 2021-11-25 DIAGNOSIS — U071 COVID-19: Secondary | ICD-10-CM | POA: Diagnosis not present

## 2021-11-25 LAB — POCT INFLUENZA A/B
Influenza A, POC: NEGATIVE
Influenza B, POC: NEGATIVE

## 2021-11-25 LAB — POC COVID19 BINAXNOW: SARS Coronavirus 2 Ag: POSITIVE — AB

## 2021-11-25 MED ORDER — MOLNUPIRAVIR EUA 200MG CAPSULE
4.0000 | ORAL_CAPSULE | Freq: Two times a day (BID) | ORAL | 0 refills | Status: AC
  Filled 2021-11-25: qty 40, 5d supply, fill #0

## 2021-11-25 NOTE — Patient Instructions (Signed)
You have covid 19- we will treat with molnupiravir for 5 days Start on this asap If you are not doing ok- if you are getting worse or if you are in any distress please seek care right away Quarantine at home for 5 days!

## 2021-11-25 NOTE — Progress Notes (Signed)
[place] at [facility] [address], [place], [place] [address] [phone]  Date:  11/25/2021   Name:  Curtis Luna   DOB:  1961/10/03   MRN:  [id number]  PCP:  Darreld Mclean, MD    Chief Complaint: URI (Sxs x Saturday night)   History of Present Illness:  Curtis Luna is a 60 y.o. very pleasant male patient who presents with the following:  Pt noted onset of illness Saturday- today is Monday He had fever, body aches, cough.  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   Seasonal affective disorder ([place]) 10/02/2014   Internal hemorrhoid 04/20/2014   Chronic low back pain 04/20/2014   Anxiety 04/20/2014   Esophageal reflux 04/04/2011   Diverticulosis of colon (without mention of hemorrhage) 04/04/2011    Past Medical History:  Diagnosis Date   Abdominal pain, right lower quadrant    Acute bronchospasm    Allergy    Anxiety    on and off   Backache, unspecified    Chicken pox    Diverticulosis of colon (without mention of hemorrhage)    Esophageal reflux    External hemorrhoids without mention of complication    thrombosed   Hiatal hernia    Hyperlipidemia    no medicines    Inguinal hernia without mention of obstruction or gangrene, unilateral or unspecified, (not specified as recurrent)    Migraine    Obstructive sleep apnea (adult) (pediatric)    Other  symptoms involving digestive system(787.99)    Pain in joint, forearm    Pain in joint, shoulder region    Seasonal allergies    Sleep apnea    no cpap but uses dental device    Thoracic or lumbosacral neuritis or radiculitis, unspecified     Past Surgical History:  Procedure Laterality Date   COLON RESECTION  10/06   sigmoid colon   COLONOSCOPY     INGUINAL HERNIA REPAIR  12/2012   Left   UPPER GASTROINTESTINAL ENDOSCOPY     WISDOM TOOTH EXTRACTION      Social History   Tobacco Use   Smoking status: Former    Types: Cigarettes, Cigars    Quit date: 10/04/1987    Years since quitting: 34.1   Smokeless tobacco: Never  Vaping Use   Vaping Use: Never used  Substance Use Topics   Alcohol use: Yes    Alcohol/week: 7.0 - 14.0 standard drinks    Types: 7 - 14 Standard drinks or equivalent per week    Comment: 2 daily    Drug use: No    Family History  Problem Relation Age of Onset   Other Father        MVA   Lung cancer Mother 37       Deceased   Diabetes  Maternal Aunt    Heart disease Paternal Grandmother    Heart attack Paternal Grandmother    Healthy Brother        x2   Aneurysm Sister        Brain   Allergies Daughter        x2   Migraines Daughter        x1   Colon cancer Neg Hx    Colon polyps Neg Hx    Esophageal cancer Neg Hx    Rectal cancer Neg Hx    Stomach cancer Neg Hx    Pancreatic cancer Neg Hx    Allergic rhinitis Neg Hx    Angioedema Neg Hx    Asthma Neg Hx    Atopy Neg Hx    Eczema Neg Hx    Immunodeficiency Neg Hx    Urticaria Neg Hx     Allergies  Allergen Reactions   Escitalopram Hives    Pt felt anxious  Pt felt his skin was "on fire"   Protonix [Pantoprazole Sodium] Other (See Comments)    Tingling of extremities    Medication list has been reviewed and updated.  Current Outpatient Medications on File Prior to Visit  Medication Sig Dispense Refill   Ascorbic Acid (VITAMIN C) 1000 MG tablet Take 1,000 mg by mouth daily.      aspirin EC 81 MG tablet Take 1 tablet (81 mg total) by mouth daily. Swallow whole. 90 tablet 3   b complex vitamins tablet Take 1 tablet by mouth daily. Every other day     cholecalciferol (VITAMIN D3) 25 MCG (1000 UT) tablet Take 1,000 Units by mouth daily.     gabapentin (NEURONTIN) 100 MG capsule TAKE 1 CAPSULE BY MOUTH 3  TIMES DAILY 270 capsule 1   LORazepam (ATIVAN) 1 MG tablet Take 1 tablet (1 mg total) by mouth as needed for anxiety. May take 1 per day 30 tablet 0   Multiple Vitamin (MULTIVITAMIN) tablet Take 1 tablet by mouth daily. Men's 50 plus multivitamin daily     rosuvastatin (CRESTOR) 20 MG tablet Take 1 tablet (20 mg total) by mouth daily. 90 tablet 3   esomeprazole (NEXIUM) 40 MG capsule Take 1 capsule (40 mg total) by mouth daily before breakfast. 90 capsule 3   No current facility-administered medications on file prior to visit.    Review of Systems:  As per HPI- otherwise negative. Denies any urinary symptoms or abdominal pain  Physical Examination: Vitals:   11/25/21 1146  BP: 110/60  Pulse: 96  Resp: 18  Temp: 99.9 F (37.7 C)  SpO2: 96%   Vitals:   11/25/21 1146  Weight: 156 lb (70.8 kg)  Height: 5\' 5"  (1.651 m)   Body mass index is 25.96 kg/m. Ideal Body Weight: Weight in (lb) to have BMI = 25: 149.9  GEN: no acute distress.  Low-grade fever, appears to have the flu  HEENT: Atraumatic, Normocephalic. Bilateral TM wnl, oropharynx normal.  PEERL,EOMI.   Ears and Nose: No external deformity. CV: RRR, No M/G/R. No JVD. No thrill. No extra heart sounds. PULM: CTA B, no wheezes, crackles, rhonchi. No retractions. No resp. distress. No accessory muscle use. ABD: S, NT, ND, +BS. No rebound. No HSM. EXTR: No c/c/e PSYCH: Normally interactive. Conversant.   Results for orders placed or performed in visit on 11/25/21  POCT Influenza A/B  Result Value Ref Range   Influenza A, POC Negative Negative   Influenza B, POC Negative Negative  POC  COVID-19  BinaxNow  Result Value Ref Range   SARS Coronavirus 2 Ag Positive (A) Negative    Assessment and Plan: COVID-19  Fever, unspecified fever cause - Plan: POCT Influenza A/B, POC COVID-19 BinaxNow, molnupiravir EUA (LAGEVRIO) 200 mg CAPS capsule  Patient seen today with COVID-19.  This is his first episode of COVID, he has not vaccinated.  Prescribed molnupiravir, asked him to start on this as soon as possible Otherwise continue fever management and over-the-counter medicines as needed, plenty of fluids.  If not doing okay or if any distress he is advised to seek help immediately      Signed Lamar Blinks, MD

## 2021-11-27 ENCOUNTER — Other Ambulatory Visit (HOSPITAL_BASED_OUTPATIENT_CLINIC_OR_DEPARTMENT_OTHER): Payer: Self-pay

## 2021-11-27 ENCOUNTER — Ambulatory Visit: Payer: 59 | Admitting: Family Medicine

## 2021-11-27 ENCOUNTER — Encounter: Payer: Self-pay | Admitting: Family Medicine

## 2021-11-27 MED ORDER — HYDROCODONE BIT-HOMATROP MBR 5-1.5 MG/5ML PO SOLN
5.0000 mL | Freq: Three times a day (TID) | ORAL | 0 refills | Status: AC | PRN
  Filled 2021-11-27: qty 60, 4d supply, fill #0

## 2021-12-06 ENCOUNTER — Other Ambulatory Visit (HOSPITAL_BASED_OUTPATIENT_CLINIC_OR_DEPARTMENT_OTHER): Payer: Self-pay

## 2021-12-16 ENCOUNTER — Other Ambulatory Visit: Payer: Self-pay | Admitting: Internal Medicine

## 2022-01-21 NOTE — Patient Instructions (Addendum)
Good to see you today, I will be in touch with your labs We continue to recommend routine vaccination including flu, COVID-19, shingles  Ok to use an OTC H2 blocker like famotidine and see if it controls your acid reflux symptoms   We will get a repeat CT scan of your chest- please schedule for April

## 2022-01-21 NOTE — Progress Notes (Addendum)
[place] at [facility] [address] [place], [place] [address] [phone]  Date:  01/23/2022   Name:  Curtis Luna   DOB:  1961-01-05   MRN:  [id number]  PCP:  Darreld Mclean, MD    Chief Complaint: Annual Exam (Concerns/ questions: none/Zoster: none in ncir)   History of Present Illness:  Curtis Luna is a 60 y.o. very pleasant male patient who presents with the following:  Patient seen today for physical exam- -history of peripheral neuropathy, reflux, diverticulosis, CAD, ocular migraine headache, hyperlipidemia  Most recent visit with myself was in December when he had COVID-19 He was completely unvaccinated, had moderate symptoms with fever to 101.5-treated with antivirals He is feeling back to normal now   We did an MRI/MRA of his head last year due to headaches, these were normal We also got a neck MRI last September which showed degenerative change He is seen by allergist, Dr. Verlin Fester for immunotherapy- this is going well He is allergic to many plants, trees  Most recent visit with cardiology was in June- Dr Margaretann Loveless with [facility] [place] CAD -  - continue ASA 81 mg daily and statin. He will resume aspirin today after our discussion on secondary prevention of CAD. HLD - lipids improved, and at goal. Continue statin. Palpitations  - stable overall, continue to observe. Low risk cardiac monitor. Did not tolerate metoprolol but overall feeling well and trying to avoid triggering foods  Colon cancer screening is up-to-date Shingles vaccine- pt declines  Patient continues to decline flu and COVID-19 vaccination Most recent lab work almost 1 year ago, update today We will repeat PSA today, it had gone up quite a bit March 2022, receded when rechecked in July  He is fasting today   He is trying to get some exercise - he does some walking He enjoys walking more outside in the warmer months   He is a former smoker- quit in the  80s He does have a lung nodule that we are following- CT done 4/22, need to order 1 year follow-up  Patient Active Problem List   Diagnosis Date Noted   Allergic reaction to vaccine 11/12/2020   Toe pain, right 08/23/2018   Pain of left heel 06/27/2018   Multiple thyroid nodules 11/05/2015   Peripheral neuropathy 04/18/2015   Vertigo 04/15/2015   FH: brain aneurysm 04/15/2015   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   Seasonal affective disorder ([place]) 10/02/2014   Internal hemorrhoid 04/20/2014   Chronic low back pain 04/20/2014   Anxiety 04/20/2014   Esophageal reflux 04/04/2011   Diverticulosis of colon (without mention of hemorrhage) 04/04/2011    Past Medical History:  Diagnosis Date   Abdominal pain, right lower quadrant    Acute bronchospasm    Allergy    Anxiety    on and off   Backache, unspecified    Chicken pox    Diverticulosis of colon (without mention of hemorrhage)    Esophageal reflux    External hemorrhoids without mention of complication    thrombosed   Hiatal hernia    Hyperlipidemia    no medicines    Inguinal hernia without mention of obstruction or gangrene, unilateral or unspecified, (not specified as recurrent)    Migraine    Obstructive sleep apnea (adult) (pediatric)    Other symptoms involving digestive system(787.99)    Pain in joint, forearm    Pain in joint, shoulder region  Seasonal allergies    Sleep apnea    no cpap but uses dental device    Thoracic or lumbosacral neuritis or radiculitis, unspecified     Past Surgical History:  Procedure Laterality Date   COLON RESECTION  10/06   sigmoid colon   COLONOSCOPY     INGUINAL HERNIA REPAIR  12/2012   Left   UPPER GASTROINTESTINAL ENDOSCOPY     WISDOM TOOTH EXTRACTION      Social History   Tobacco Use   Smoking status: Former    Types: Cigarettes, Cigars    Quit date: 10/04/1987    Years since quitting: 34.3   Smokeless tobacco: Never  Vaping Use   Vaping Use:  Never used  Substance Use Topics   Alcohol use: Yes    Alcohol/week: 7.0 - 14.0 standard drinks    Types: 7 - 14 Standard drinks or equivalent per week    Comment: 2 daily    Drug use: No    Family History  Problem Relation Age of Onset   Other Father        MVA   Lung cancer Mother 36       Deceased   Diabetes Maternal Aunt    Heart disease Paternal Grandmother    Heart attack Paternal Grandmother    Healthy Brother        x2   Aneurysm Sister        Brain   Allergies Daughter        x2   Migraines Daughter        x1   Colon cancer Neg Hx    Colon polyps Neg Hx    Esophageal cancer Neg Hx    Rectal cancer Neg Hx    Stomach cancer Neg Hx    Pancreatic cancer Neg Hx    Allergic rhinitis Neg Hx    Angioedema Neg Hx    Asthma Neg Hx    Atopy Neg Hx    Eczema Neg Hx    Immunodeficiency Neg Hx    Urticaria Neg Hx     Allergies  Allergen Reactions   Escitalopram Hives    Pt felt anxious  Pt felt his skin was "on fire"   Protonix [Pantoprazole Sodium] Other (See Comments)    Tingling of extremities    Medication list has been reviewed and updated.  Current Outpatient Medications on File Prior to Visit  Medication Sig Dispense Refill   Ascorbic Acid (VITAMIN C) 1000 MG tablet Take 1,000 mg by mouth daily.     aspirin EC 81 MG tablet Take 1 tablet (81 mg total) by mouth daily. Swallow whole. 90 tablet 3   b complex vitamins tablet Take 1 tablet by mouth daily. Every other day     cholecalciferol (VITAMIN D3) 25 MCG (1000 UT) tablet Take 1,000 Units by mouth daily.     esomeprazole (NEXIUM) 40 MG capsule Take 1 capsule (40 mg total) by mouth in the morning and at bedtime. 180 capsule 1   gabapentin (NEURONTIN) 100 MG capsule TAKE 1 CAPSULE BY MOUTH 3  TIMES DAILY 270 capsule 1   LORazepam (ATIVAN) 1 MG tablet Take 1 tablet (1 mg total) by mouth as needed for anxiety. May take 1 per day 30 tablet 0   Multiple Vitamin (MULTIVITAMIN) tablet Take 1 tablet by mouth  daily. Men's 50 plus multivitamin daily     rosuvastatin (CRESTOR) 20 MG tablet Take 1 tablet (20 mg total) by mouth daily. 90 tablet  3   No current facility-administered medications on file prior to visit.    Review of Systems:  As per HPI- otherwise negative.   Physical Examination: Vitals:   01/23/22 0818  BP: 110/60  Pulse: 73  Resp: 18  Temp: 98.3 F (36.8 C)  SpO2: 99%   Vitals:   01/23/22 0818  Weight: 151 lb 12.8 oz (68.9 kg)  Height: 5\' 5"  (1.651 m)   Body mass index is 25.26 kg/m. Ideal Body Weight: Weight in (lb) to have BMI = 25: 149.9  GEN: no acute distress.  Minimal overweight, looks well  HEENT: Atraumatic, Normocephalic.   Bilateral TM wnl, oropharynx normal.  PEERL,EOMI.   Ears and Nose: No external deformity. CV: RRR, No M/G/R. No JVD. No thrill. No extra heart sounds. PULM: CTA B, no wheezes, crackles, rhonchi. No retractions. No resp. distress. No accessory muscle use. ABD: S, NT, ND, +BS. No rebound. No HSM. EXTR: No c/c/e PSYCH: Normally interactive. Conversant.    Assessment and Plan: Physical exam  Vitamin D deficiency - Plan: VITAMIN D 25 Hydroxy (Vit-D Deficiency, Fractures)  Environmental allergies  Screening for prostate cancer - Plan: PSA  Coronary artery disease involving native coronary artery of native heart without angina pectoris - Plan: Comprehensive metabolic panel, Lipid panel  Screening for diabetes mellitus - Plan: Comprehensive metabolic panel, Hemoglobin A1c  Screening for deficiency anemia - Plan: CBC  Palpitations - Plan: TSH  Lung nodule - Plan: CT CHEST LUNG CA SCREEN LOW DOSE W/O CM   Physical exam today.  Encouraged healthy diet and exercise routine Will plan further follow- up pending labs. Ordered follow-up CT for lung nodule- to be done in April Complete and fax in insurance form once labs are in   Kimber[place], MD  Received labs as below Form completed Message to pt  Results for  orders placed or performed in visit on 01/23/22  CBC  Result Value Ref Range   WBC 6.2 4.0 - 10.5 K/uL   RBC 4.52 4.22 - 5.81 Mil/uL   Platelets 267.0 150.0 - 400.0 K/uL   Hemoglobin 14.1 13.0 - 17.0 g/dL   HCT 42.2 39.0 - 52.0 %   MCV 93.5 78.0 - 100.0 fl   MCHC 33.4 30.0 - 36.0 g/dL   RDW 14.9 11.5 - 15.5 %  Comprehensive metabolic panel  Result Value Ref Range   Sodium 142 135 - 145 mEq/L   Potassium 4.4 3.5 - 5.1 mEq/L   Chloride 104 96 - 112 mEq/L   CO2 35 (H) 19 - 32 mEq/L   Glucose, Bld 95 70 - 99 mg/dL   BUN 18 6 - 23 mg/dL   Creatinine, Ser 0.98 0.40 - 1.50 mg/dL   Total Bilirubin 0.5 0.2 - 1.2 mg/dL   Alkaline Phosphatase 41 39 - 117 U/L   AST 26 0 - 37 U/L   ALT 24 0 - 53 U/L   Total Protein 6.8 6.0 - 8.3 g/dL   Albumin 4.3 3.5 - 5.2 g/dL   GFR 83.12 >60.00 mL/min   Calcium 9.5 8.4 - 10.5 mg/dL  Hemoglobin A1c  Result Value Ref Range   Hgb A1c MFr Bld 5.8 4.6 - 6.5 %  Lipid panel  Result Value Ref Range   Cholesterol 130 0 - 200 mg/dL   Triglycerides 76.0 0.0 - 149.0 mg/dL   HDL 52.70 >39.00 mg/dL   VLDL 15.2 0.0 - 40.0 mg/dL   LDL Cholesterol 62 0 - 99 mg/dL   Total  CHOL/HDL Ratio 2    NonHDL 77.46   PSA  Result Value Ref Range   PSA 1.03 0.10 - 4.00 ng/mL  VITAMIN D 25 Hydroxy (Vit-D Deficiency, Fractures)  Result Value Ref Range   VITD 59.64 30.00 - 100.00 ng/mL  TSH  Result Value Ref Range   TSH 1.68 0.35 - 5.50 uIU/mL

## 2022-01-23 ENCOUNTER — Encounter: Payer: Self-pay | Admitting: Family Medicine

## 2022-01-23 ENCOUNTER — Ambulatory Visit (INDEPENDENT_AMBULATORY_CARE_PROVIDER_SITE_OTHER): Payer: 59 | Admitting: Family Medicine

## 2022-01-23 VITALS — BP 110/60 | HR 73 | Temp 98.3°F | Resp 18 | Ht 65.0 in | Wt 151.8 lb

## 2022-01-23 DIAGNOSIS — Z131 Encounter for screening for diabetes mellitus: Secondary | ICD-10-CM | POA: Diagnosis not present

## 2022-01-23 DIAGNOSIS — Z130 Encounter for screening for diseases of the blood and blood-forming organs and certain disorders involving the immune mechanism: Secondary | ICD-10-CM

## 2022-01-23 DIAGNOSIS — Z125 Encounter for screening for malignant neoplasm of prostate: Secondary | ICD-10-CM

## 2022-01-23 DIAGNOSIS — E559 Vitamin D deficiency, unspecified: Secondary | ICD-10-CM

## 2022-01-23 DIAGNOSIS — Z9109 Other allergy status, other than to drugs and biological substances: Secondary | ICD-10-CM | POA: Diagnosis not present

## 2022-01-23 DIAGNOSIS — R002 Palpitations: Secondary | ICD-10-CM | POA: Diagnosis not present

## 2022-01-23 DIAGNOSIS — Z0000 Encounter for general adult medical examination without abnormal findings: Secondary | ICD-10-CM

## 2022-01-23 DIAGNOSIS — I2510 Atherosclerotic heart disease of native coronary artery without angina pectoris: Secondary | ICD-10-CM | POA: Diagnosis not present

## 2022-01-23 DIAGNOSIS — R911 Solitary pulmonary nodule: Secondary | ICD-10-CM

## 2022-01-23 LAB — CBC
HCT: 42.2 % (ref 39.0–52.0)
Hemoglobin: 14.1 g/dL (ref 13.0–17.0)
MCHC: 33.4 g/dL (ref 30.0–36.0)
MCV: 93.5 fl (ref 78.0–100.0)
Platelets: 267 10*3/uL (ref 150.0–400.0)
RBC: 4.52 Mil/uL (ref 4.22–5.81)
RDW: 14.9 % (ref 11.5–15.5)
WBC: 6.2 10*3/uL (ref 4.0–10.5)

## 2022-01-23 LAB — COMPREHENSIVE METABOLIC PANEL
ALT: 24 U/L (ref 0–53)
AST: 26 U/L (ref 0–37)
Albumin: 4.3 g/dL (ref 3.5–5.2)
Alkaline Phosphatase: 41 U/L (ref 39–117)
BUN: 18 mg/dL (ref 6–23)
CO2: 35 mEq/L — ABNORMAL HIGH (ref 19–32)
Calcium: 9.5 mg/dL (ref 8.4–10.5)
Chloride: 104 mEq/L (ref 96–112)
Creatinine, Ser: 0.98 mg/dL (ref 0.40–1.50)
GFR: 83.12 mL/min (ref >60.00)
Glucose, Bld: 95 mg/dL (ref 70–99)
Potassium: 4.4 mEq/L (ref 3.5–5.1)
Sodium: 142 mEq/L (ref 135–145)
Total Bilirubin: 0.5 mg/dL (ref 0.2–1.2)
Total Protein: 6.8 g/dL (ref 6.0–8.3)

## 2022-01-23 LAB — LIPID PANEL
Cholesterol: 130 mg/dL (ref 0–200)
HDL: 52.7 mg/dL (ref >39.00)
LDL Cholesterol: 62 mg/dL (ref 0–99)
NonHDL: 77.46
Total CHOL/HDL Ratio: 2
Triglycerides: 76 mg/dL (ref 0.0–149.0)
VLDL: 15.2 mg/dL (ref 0.0–40.0)

## 2022-01-23 LAB — HEMOGLOBIN A1C: Hgb A1c MFr Bld: 5.8 % (ref 4.6–6.5)

## 2022-01-23 LAB — TSH: TSH: 1.68 u[IU]/mL (ref 0.35–5.50)

## 2022-01-23 LAB — VITAMIN D 25 HYDROXY (VIT D DEFICIENCY, FRACTURES): VITD: 59.64 ng/mL (ref 30.00–100.00)

## 2022-01-23 LAB — PSA: PSA: 1.03 ng/mL (ref 0.10–4.00)

## 2022-03-07 ENCOUNTER — Other Ambulatory Visit (INDEPENDENT_AMBULATORY_CARE_PROVIDER_SITE_OTHER): Payer: Self-pay | Admitting: Medical

## 2022-03-10 ENCOUNTER — Encounter (INDEPENDENT_AMBULATORY_CARE_PROVIDER_SITE_OTHER): Payer: Self-pay

## 2022-03-11 ENCOUNTER — Other Ambulatory Visit: Payer: MEDICAID | Attending: Internal Medicine

## 2022-03-11 ENCOUNTER — Other Ambulatory Visit: Payer: Self-pay

## 2022-03-11 DIAGNOSIS — K7030 Alcoholic cirrhosis of liver without ascites: Secondary | ICD-10-CM | POA: Insufficient documentation

## 2022-03-11 LAB — CBC WITH DIFF
BASOPHIL #: 0 10*3/uL (ref 0.00–0.20)
BASOPHIL %: 1 % (ref 0–2)
EOSINOPHIL #: 0.1 10*3/uL (ref 0.00–0.60)
EOSINOPHIL %: 4 % (ref 0–5)
HCT: 39.3 % (ref 36.0–46.0)
HGB: 13.4 g/dL — ABNORMAL LOW (ref 13.9–16.3)
LYMPHOCYTE #: 1.2 10*3/uL (ref 1.10–3.80)
LYMPHOCYTE %: 31 % (ref 19–46)
MCH: 31.8 pg (ref 25.4–34.0)
MCHC: 34.2 g/dL (ref 30.0–37.0)
MCV: 93.1 fL (ref 80.0–100.0)
MONOCYTE #: 0.4 10*3/uL (ref 0.10–0.80)
MONOCYTE %: 11 % (ref 4–12)
MPV: 9.4 fL (ref 7.5–11.5)
NEUTROPHIL #: 2.1 10*3/uL (ref 1.80–7.50)
NEUTROPHIL %: 54 % (ref 41–69)
PLATELETS: 68 10*3/uL — ABNORMAL LOW (ref 130–400)
RBC: 4.22 10*6/uL — ABNORMAL LOW (ref 4.30–5.90)
RDW: 16 % — ABNORMAL HIGH (ref 11.5–14.0)
WBC: 3.9 10*3/uL — ABNORMAL LOW (ref 4.5–11.5)

## 2022-03-11 LAB — COMPREHENSIVE METABOLIC PANEL, NON-FASTING
ALBUMIN/GLOBULIN RATIO: 1 — ABNORMAL LOW (ref 1.5–2.5)
ALBUMIN: 3.5 g/dL (ref 3.5–5.0)
ALKALINE PHOSPHATASE: 102 U/L (ref 38–126)
ALT (SGPT): 34 U/L (ref <50)
ANION GAP: 7 mmol/L (ref 5–19)
AST (SGOT): 44 U/L (ref 17–59)
BILIRUBIN TOTAL: 0.7 mg/dL (ref 0.2–1.3)
BUN/CREA RATIO: 12 (ref 6–20)
BUN: 11 mg/dL (ref 9–20)
CALCIUM: 8.9 mg/dL (ref 8.4–10.2)
CHLORIDE: 110 mmol/L — ABNORMAL HIGH (ref 98–107)
CO2 TOTAL: 25 mmol/L (ref 22–30)
CREATININE: 0.92 mg/dL (ref 0.66–1.20)
ESTIMATED GFR: >60 mL/min/{1.73_m2} (ref >60)
GLUCOSE: 112 mg/dL — ABNORMAL HIGH (ref 74–106)
POTASSIUM: 4.5 mmol/L (ref 3.5–5.1)
PROTEIN TOTAL: 6.9 g/dL (ref 6.3–8.2)
SODIUM: 142 mmol/L (ref 137–145)

## 2022-03-26 ENCOUNTER — Ambulatory Visit: Payer: MEDICAID | Attending: Internal Medicine | Admitting: Internal Medicine

## 2022-03-26 ENCOUNTER — Other Ambulatory Visit: Payer: Self-pay

## 2022-03-26 ENCOUNTER — Encounter (INDEPENDENT_AMBULATORY_CARE_PROVIDER_SITE_OTHER): Payer: Self-pay | Admitting: Internal Medicine

## 2022-03-26 VITALS — BP 134/70 | HR 69 | Resp 24 | Ht 69.0 in | Wt 248.0 lb

## 2022-03-26 DIAGNOSIS — Z23 Encounter for immunization: Secondary | ICD-10-CM | POA: Insufficient documentation

## 2022-03-26 DIAGNOSIS — Z8701 Personal history of pneumonia (recurrent): Secondary | ICD-10-CM | POA: Insufficient documentation

## 2022-03-26 DIAGNOSIS — J449 Chronic obstructive pulmonary disease, unspecified: Secondary | ICD-10-CM | POA: Insufficient documentation

## 2022-03-26 DIAGNOSIS — Z9189 Other specified personal risk factors, not elsewhere classified: Secondary | ICD-10-CM | POA: Insufficient documentation

## 2022-03-26 DIAGNOSIS — Z6836 Body mass index (BMI) 36.0-36.9, adult: Secondary | ICD-10-CM

## 2022-03-26 DIAGNOSIS — K7460 Unspecified cirrhosis of liver: Secondary | ICD-10-CM | POA: Insufficient documentation

## 2022-03-26 DIAGNOSIS — Z720 Tobacco use: Secondary | ICD-10-CM

## 2022-03-26 DIAGNOSIS — E785 Hyperlipidemia, unspecified: Secondary | ICD-10-CM | POA: Insufficient documentation

## 2022-03-26 DIAGNOSIS — Z716 Tobacco abuse counseling: Secondary | ICD-10-CM

## 2022-03-26 DIAGNOSIS — F99 Mental disorder, not otherwise specified: Secondary | ICD-10-CM | POA: Insufficient documentation

## 2022-03-26 MED ORDER — ROSUVASTATIN 5 MG TABLET
5.0000 mg | ORAL_TABLET | Freq: Every evening | ORAL | 4 refills | Status: DC
Start: 2022-03-26 — End: 2023-04-02

## 2022-03-26 NOTE — Progress Notes (Signed)
[facility], [place] [address]  Phone: [phone]  Fax: [phone]    Encounter Date: 03/26/2022    Patient ID:  Hector [name]  WCB:[id number]    DOB: 04/14/1961  Age: 61 y.o. male    Progress Note    Subjective:     Chief Complaint   Patient presents with   . Follow Up 6 Months     Patient here today along with significant other for 6 month follow up, patient states he is doing well, patient has had several falls recently,            HPI patient here for his checkup.  He complains of pain everywhere losing his balance.  Has not had alcohol for almost a year now.  Still smokes 1 pack of cigarettes daily    Current Outpatient Medications   Medication Sig   . ADVAIR HFA 115-21 mcg/actuation Inhalation oral inhaler INHALE 2 PUFFS BY INHALATION ROUTE EVERY 12 HOURS.   [name] albuterol sulfate (PROAIR HFA) 90 mcg/actuation Inhalation HFA Aerosol Inhaler Take 1-2 Puffs by inhalation Every 6 hours as needed for Other (SHORTNESS OF BREATH,COUGH,WHEEZING)   . ipratropium-albuterol 0.5 mg-3 mg(2.5 mg base)/3 mL Solution for Nebulization Take 3 mL by nebulization Three times a day 8 HOURS APART AS NEEDED FOR COUGH,SHORTNESS OF BREATH,WHEEZING   . nystatin (NYSTOP) 100,000 unit/gram Powder Apply topically Twice daily (Patient not taking: Reported on 10/22/2021)   . OLANZapine (ZYPREXA) 10 mg Oral Tablet Take 1 Tablet (10 mg total) by mouth Every night   . pantoprazole (PROTONIX) 40 mg Oral Tablet, Delayed Release (E.C.) TAKE 1 TABLET (40 MG TOTAL) BY MOUTH TWICE A DAY 30 MINTUTES BEFORE MEALS FOR 90 DAYS   . sertraline (ZOLOFT) 100 mg Oral Tablet Take 2 Tablets (200 mg total) by mouth Once a day for 30 days   . traZODone (DESYREL) 50 mg Oral Tablet Take 1 Tablet (50 mg total) by mouth Every night Unsure dosage     Allergies   Allergen Reactions   . Bee Venom Protein (Honey Bee)      Bee stings     Past Medical History:   Diagnosis Date   . Alcohol abuse    . Cirrhosis (CMS [place])    . Coma  (CMS [facility])     after fall due to [facility] May 2022   . Delirium, withdrawal, alcoholic (CMS [facility])    . Depression    . HTN (hypertension)    . Unknown cause of injury     fx nose   . Wears glasses          Past Surgical History:   Procedure Laterality Date   . COLONOSCOPY     . ESOPHAGOGASTRODUODENOSCOPY     . HX TONSILLECTOMY           Family Medical History:     Problem Relation (Age of Onset)    Asthma Father    Cerebral Aneurysm Sister          Social History     Tobacco Use   . Smoking status: Every Day     Packs/day: 1.00     Types: Cigarettes   . Smokeless tobacco: Never   Vaping Use   . Vaping Use: Never used   Substance Use Topics   . Alcohol use: Not Currently     Alcohol/week: 15.0 standard drinks     Types: 15 Cans of beer per week  Comment:  may 17th, 2022    . Drug use: Yes     Types: Marijuana     Comment:  a joint or 2 a day        Review of Systems   Constitutional: Negative for activity change, appetite change, fever and unexpected weight change.   HENT: Negative for congestion and ear pain.    Eyes: Negative for visual disturbance.   Respiratory: Negative for cough, chest tightness, shortness of breath and wheezing.    Cardiovascular: Negative for chest pain, palpitations and leg swelling.   Gastrointestinal: Negative for abdominal pain, constipation and diarrhea.   Genitourinary: Negative for dysuria and frequency.   Musculoskeletal: Positive for arthralgias and neck pain. Negative for back pain.   Skin: Negative for color change and rash.   Neurological: Negative for dizziness and light-headedness.   Psychiatric/Behavioral: Negative for behavioral problems and sleep disturbance. The patient is not nervous/anxious.        Objective:   Vitals: BP 134/70 (Site: Right, Patient Position: Sitting, Cuff Size: Adult)   Pulse 69   Resp (!) 24   Ht 1.753 m ('5\' 9"'$ )   Wt 112 kg (248 lb)   SpO2 95%   BMI 36.62 kg/m         Physical Exam  Constitutional:       General: He is not in acute distress.      Appearance: Normal appearance.   HENT:      Head: Normocephalic and atraumatic.      Nose: Nose normal.      Mouth/Throat:      Pharynx: Oropharynx is clear.   Eyes:      Pupils: Pupils are equal, round, and reactive to light.   Cardiovascular:      Rate and Rhythm: Normal rate and regular rhythm.      Pulses: Normal pulses.      Heart sounds: Normal heart sounds.   Pulmonary:      Effort: Pulmonary effort is normal.      Breath sounds: Normal breath sounds. No wheezing.   Abdominal:      General: Abdomen is flat. Bowel sounds are normal.      Palpations: There is no mass.      Tenderness: There is no abdominal tenderness.      Hernia: No hernia is present.   Musculoskeletal:         General: No swelling, tenderness or deformity. Normal range of motion.   Skin:     General: Skin is warm and dry.      Findings: No rash.   Neurological:      General: No focal deficit present.      Mental Status: He is alert and oriented to person, place, and time.   Psychiatric:         Mood and Affect: Mood normal.         Behavior: Behavior normal.         Assessment & Plan:   Assessment:    ICD-10-CM    1. Dyslipidemia  E78.5       2. Cirrhosis of liver without ascites, unspecified hepatic cirrhosis type (CMS HCC)  K74.60 Pneumovax (Admin)     Hep A/Hep B Combined Vaccine (Admin)      3. Vaccine for viral hepatitis  Z23 Hep A/Hep B Combined Vaccine (Admin)      4. History of pneumonia as indication for 23-polyvalent pneumococcal polysaccharide vaccine  Z87.01 Pneumovax (Admin)  Z91.89       5. Chronic obstructive pulmonary disease, unspecified COPD type (CMS HCC)  J44.9       6. Mental disorder  F99            Return in about 2 months (around 05/26/2022).    Start him on a low-dose statin has his eyes risk for coronary artery disease, if he takes the medication will recheck his LFT and fasting lipid profile in 2 months      Patient needs to have vaccination as he has cirrhosis a agreed for the hepb/a vaccine series  and also  Pneumovax which will be given today     He and I asked him to take a multivitamin tablets for B12 and B6 stay away from alcohol and also stop smoking    Follow-up with  other consultant as scheduled    Jenkins Rouge, MD  03/26/2022, 11:17    This note was partially created using M*Modal fluency direct system (voice recognition software ) and is inherently subject to errors including those of syntax and "sound- alike" substitutions which may escape proofreading.  In such instances, original meaning may be extrapolated by contextual derivation.

## 2022-03-26 NOTE — Patient Instructions (Signed)
Take vitamins daily     Continue same meds

## 2022-03-27 ENCOUNTER — Ambulatory Visit (HOSPITAL_BASED_OUTPATIENT_CLINIC_OR_DEPARTMENT_OTHER)
Admission: RE | Admit: 2022-03-27 | Discharge: 2022-03-27 | Disposition: A | Discharge status: Home / Self Care | Payer: 59 | Source: Ambulatory Visit | Attending: Family Medicine | Admitting: Family Medicine

## 2022-03-27 DIAGNOSIS — R918 Other nonspecific abnormal finding of lung field: Secondary | ICD-10-CM | POA: Insufficient documentation

## 2022-03-29 ENCOUNTER — Other Ambulatory Visit: Payer: Self-pay | Admitting: Family Medicine

## 2022-03-29 ENCOUNTER — Encounter: Payer: Self-pay | Admitting: Family Medicine

## 2022-03-29 DIAGNOSIS — R911 Solitary pulmonary nodule: Secondary | ICD-10-CM

## 2022-03-29 NOTE — Progress Notes (Signed)
Therapist, music at [facility] ?[address], [address] ?[place], [place] [address] ?336 [phone] ?Fax [phone] ? ?Date:  04/02/2022  ? ?Name:  Curtis Luna   DOB:  Feb 28, 1961   MRN:  [id number] ? ?PCP:  Darreld Mclean, MD  ? ? ?Chief Complaint: Hip Pain (Pain in right hip and both shoulders for the past 3-4 months) ? ? ?History of Present Illness: ? ?Curtis Luna is a 61 y.o. very pleasant male patient who presents with the following: ?Pt  seen today with concern of hip and shoulder pain ?Last seen by myself in February - history of peripheral neuropathy, reflux, diverticulosis, CAD, ocular migraine headache, hyperlipidemia ?  ?We recently got a follow-up lung CT back for him which was normal - we ordered a repeat for one year from now ?He notes right hip pain for 4-5 months- if he sits for a long time and then gets up and moving it hurts ?It hurts to lie on it ?He really has to lie on his right hip- laying on his left side causes reflux  ?NKI ?Walking does not really bother him ?No numbness or weakness of the leg  ? ?Also he has chronic right shoulder pain- also some in the left.  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  It does bother him when he lays on his right side ? ?His left shoulder was treated by Dr. Onnie Graham, he would like to see him again if orthopedic consult is needed ? ?Patient Active Problem List  ? Diagnosis Date Noted  ? Allergic reaction to vaccine 11/12/2020  ? Toe pain, right 08/23/2018  ? Pain of left heel 06/27/2018  ? Multiple thyroid nodules 11/05/2015  ? Peripheral neuropathy 04/18/2015  ? Vertigo 04/15/2015  ? FH: brain aneurysm 04/15/2015  ? 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 Seasonal affective disorder ([place]) 10/02/2014  ? Internal hemorrhoid 04/20/2014  ? Chronic low back pain 04/20/2014  ? Anxiety 04/20/2014  ? Esophageal reflux 04/04/2011  ? Diverticulosis of  colon (without mention of hemorrhage) 04/04/2011  ? ? ?Past Medical History:  ?Diagnosis Date  ? Abdominal pain, right lower quadrant   ? Acute bronchospasm   ? Allergy   ? Anxiety   ? on and off  ? Backache, unspecified   ? Chicken pox   ? Diverticulosis of colon (without mention of hemorrhage)   ? Esophageal reflux   ? External hemorrhoids without mention of complication   ? thrombosed  ? Hiatal hernia   ? Hyperlipidemia   ? no medicines   ? Inguinal hernia without mention of obstruction or gangrene, unilateral or unspecified, (not specified as recurrent)   ? Migraine   ? Obstructive sleep apnea (adult) (pediatric)   ? Other symptoms involving digestive system(787.99)   ? Pain in joint, forearm   ? Pain in joint, shoulder region   ? Seasonal allergies   ? Sleep apnea   ? no cpap but uses dental device   ? Thoracic or lumbosacral neuritis or radiculitis, unspecified   ? ? ?Past Surgical History:  ?Procedure Laterality Date  ? COLON RESECTION  10/06  ? sigmoid colon  ? COLONOSCOPY    ? INGUINAL HERNIA REPAIR  12/2012  ? Left  ? UPPER GASTROINTESTINAL ENDOSCOPY    ? WISDOM TOOTH EXTRACTION    ? ? ?Social History  ? ?Tobacco Use  ?  Smoking status: Former  ?  Types: Cigarettes, Cigars  ?  Quit date: 10/04/1987  ?  Years since quitting: 34.5  ? Smokeless tobacco: Never  ?Vaping Use  ? Vaping Use: Never used  ?Substance Use Topics  ? Alcohol use: Yes  ?  Alcohol/week: 7.0 - 14.0 standard drinks  ?  Types: 7 - 14 Standard drinks or equivalent per week  ?  Comment: 2 daily   ? Drug use: No  ? ? ?Family History  ?Problem Relation Age of Onset  ? Other Father   ?     MVA  ? Lung cancer Mother 5  ?     Deceased  ? Diabetes Maternal Aunt   ? Heart disease Paternal Grandmother   ? Heart attack Paternal Grandmother   ? Healthy Brother   ?     x2  ? Aneurysm Sister   ?     Brain  ? Allergies Daughter   ?     x2  ? Migraines Daughter   ?     x1  ? Colon cancer Neg Hx   ? Colon polyps Neg Hx   ? Esophageal cancer Neg Hx   ? Rectal  cancer Neg Hx   ? Stomach cancer Neg Hx   ? Pancreatic cancer Neg Hx   ? Allergic rhinitis Neg Hx   ? Angioedema Neg Hx   ? Asthma Neg Hx   ? Atopy Neg Hx   ? Eczema Neg Hx   ? Immunodeficiency Neg Hx   ? Urticaria Neg Hx   ? ? ?Allergies  ?Allergen Reactions  ? Escitalopram Hives  ?  Pt felt anxious  ?Pt felt his skin was "on fire"  ? Protonix [Pantoprazole Sodium] Other (See Comments)  ?  Tingling of extremities  ? ? ?Medication list has been reviewed and updated. ? ?Current Outpatient Medications on File Prior to Visit  ?Medication Sig Dispense Refill  ? Ascorbic Acid (VITAMIN C) 1000 MG tablet Take 1,000 mg by mouth daily.    ? aspirin EC 81 MG tablet Take 1 tablet (81 mg total) by mouth daily. Swallow whole. 90 tablet 3  ? b complex vitamins tablet Take 1 tablet by mouth daily. Every other day    ? cholecalciferol (VITAMIN D3) 25 MCG (1000 UT) tablet Take 1,000 Units by mouth daily.    ? gabapentin (NEURONTIN) 100 MG capsule TAKE 1 CAPSULE BY MOUTH 3  TIMES DAILY 270 capsule 1  ? LORazepam (ATIVAN) 1 MG tablet Take 1 tablet (1 mg total) by mouth as needed for anxiety. May take 1 per day 30 tablet 0  ? Multiple Vitamin (MULTIVITAMIN) tablet Take 1 tablet by mouth daily. Men's 50 plus multivitamin daily    ? raNITIdine HCl (ZANTAC PO) Take by mouth.    ? rosuvastatin (CRESTOR) 20 MG tablet Take 1 tablet (20 mg total) by mouth daily. 90 tablet 3  ? ?No current facility-administered medications on file prior to visit.  ? ? ?Review of Systems: ? ?As per HPI- otherwise negative. ? ? ?Physical Examination: ?Vitals:  ? 04/02/22 1324  ?BP: 118/76  ?Pulse: 89  ?Resp: 18  ?Temp: 98.6 ?F (37 ?C)  ?SpO2: 98%  ? ?Vitals:  ? 04/02/22 1324  ?Weight: 148 lb (67.1 kg)  ?Height: '5\' 5"'$  (1.651 m)  ? ?Body mass index is 24.63 kg/m?. ?Ideal Body Weight: Weight in (lb) to have BMI = 25: 149.9 ? ?GEN: no acute distress.  Normal weight, looks well ?HEENT:  Atraumatic, Normocephalic.  ?Ears and Nose: No external deformity. ?CV: RRR, No  M/G/R. No JVD. No thrill. No extra heart sounds. ?PULM: CTA B, no wheezes, crackles, rhonchi. No retractions. No resp. distress. No accessory muscle use. ?ABD: S, NT, ND ?EXTR: No c/c/e ?PSYCH: Normally interactive. Conversant.  ?Right hip displays full range of motion.  He is not especially tender over the greater trochanter.  Rather, he is maximally tender just posterior to the trochanter over the gluteus medius muscle ?No pain with range of motion of the hip ?Thoracolumbar flexion is somewhat decreased, extension is normal ?Left shoulder exam is normal ?Right shoulder exam: Pain at maximum range of motion especially with internal rotation, he is not able to push my hand off his back while in internal rotation ? ?Assessment and Plan: ?Right hip pain - Plan: DG Hip Unilat W OR W/O Pelvis 2-3 Views Right, Ambulatory referral to Physical Therapy ? ?Chronic right shoulder pain - Plan: Ambulatory referral to Orthopedic Surgery ? ?Patient seen today with musculoskeletal concerns as above.  I do think he would likely benefit from a shoulder injection, referral back to orthopedics ?The issue with his hip seems more muscular than joint.  We will obtain plain films of the hip.  Referred to physical therapy ?Will plan further follow- up pending x-ray reports ? ?Signed ?Lamar Blinks, MD ? ?

## 2022-03-29 NOTE — Patient Instructions (Addendum)
Good to see you again today - we will have Dr Onnie Graham look at your shoulder, and will ask their PT dept to work on your hip pain which I think is actually due to pain in the glute muscle ?We will get a hip xray today as well to evaluate for arthritis ?Let me know if you need anything!   ?

## 2022-04-02 ENCOUNTER — Ambulatory Visit (INDEPENDENT_AMBULATORY_CARE_PROVIDER_SITE_OTHER): Payer: 59 | Admitting: Family Medicine

## 2022-04-02 ENCOUNTER — Ambulatory Visit (HOSPITAL_BASED_OUTPATIENT_CLINIC_OR_DEPARTMENT_OTHER)
Admission: RE | Admit: 2022-04-02 | Discharge: 2022-04-02 | Disposition: A | Discharge status: Home / Self Care | Payer: 59 | Source: Ambulatory Visit | Attending: Family Medicine | Admitting: Family Medicine

## 2022-04-02 VITALS — BP 118/76 | HR 89 | Temp 98.6°F | Resp 18 | Ht 65.0 in | Wt 148.0 lb

## 2022-04-02 DIAGNOSIS — M25511 Pain in right shoulder: Secondary | ICD-10-CM | POA: Diagnosis not present

## 2022-04-02 DIAGNOSIS — M25551 Pain in right hip: Secondary | ICD-10-CM | POA: Diagnosis present

## 2022-04-02 DIAGNOSIS — G8929 Other chronic pain: Secondary | ICD-10-CM

## 2022-04-04 ENCOUNTER — Encounter: Payer: Self-pay | Admitting: Family Medicine

## 2022-04-07 ENCOUNTER — Other Ambulatory Visit: Payer: Self-pay

## 2022-04-07 ENCOUNTER — Encounter (INDEPENDENT_AMBULATORY_CARE_PROVIDER_SITE_OTHER): Payer: Self-pay | Admitting: Medical

## 2022-04-07 ENCOUNTER — Ambulatory Visit: Payer: MEDICAID | Attending: Medical | Admitting: Medical

## 2022-04-07 VITALS — Resp 12 | Ht 68.0 in | Wt 247.0 lb

## 2022-04-07 DIAGNOSIS — Z6837 Body mass index (BMI) 37.0-37.9, adult: Secondary | ICD-10-CM

## 2022-04-07 DIAGNOSIS — K7030 Alcoholic cirrhosis of liver without ascites: Secondary | ICD-10-CM | POA: Insufficient documentation

## 2022-04-07 DIAGNOSIS — K2270 Barrett's esophagus without dysplasia: Secondary | ICD-10-CM | POA: Insufficient documentation

## 2022-04-07 NOTE — Patient Instructions (Addendum)
Get labs and ultrasound    Take Protonix 30 minutes before breakfast and dinner    Talk to your PCP about leg swelling    Low salt diet. Avoid NSAIDs, alcohol    Stop smoking

## 2022-04-07 NOTE — Progress Notes (Signed)
GASTROENTEROLOGY, [facility] [address]  [place] [address]  Operated by [facility]     Name: Hector [name] MRN:  [id number]   Date: 04/07/2022 Age: 61 y.o.     Chief Complaint: Follow Up (Patient is following up to cirrhosis and barrett's. He states that he has only had a few episodes of bleeding since last visit. He denies any N/V or abdominal pain )     Subjective:   Patient presents for follow up regarding cirrhosis. Patient is not currently taking any diuretics.    No dysphagia. He has heartburn daily. He is taking both of his Protonix pills together in the evening. He is not taking vitamins. He has random abdominal. No nausea or vomiting. He is getting hepatitis series. He states he is not really drinking and reports last use 8-12 months ago.  He has some fecal incontinence and urinary incontinence since suffering a fall a year ago. He has gained 30 pounds since last visit according to our records. He admits that he has been eating more. He normally is on 4L of O2. He is not wearing his oxygen currently because he does not like carrying tank.     Past Medical History  Current Outpatient Medications   Medication Sig   . ADVAIR HFA 115-21 mcg/actuation Inhalation oral inhaler INHALE 2 PUFFS BY INHALATION ROUTE EVERY 12 HOURS.   [name] albuterol sulfate (PROAIR HFA) 90 mcg/actuation Inhalation HFA Aerosol Inhaler Take 1-2 Puffs by inhalation Every 6 hours as needed for Other (SHORTNESS OF BREATH,COUGH,WHEEZING)   . ipratropium-albuterol 0.5 mg-3 mg(2.5 mg base)/3 mL Solution for Nebulization Take 3 mL by nebulization Three times a day 8 HOURS APART AS NEEDED FOR COUGH,SHORTNESS OF BREATH,WHEEZING   . nystatin (NYSTOP) 100,000 unit/gram Powder Apply topically Twice daily (Patient not taking: Reported on 10/22/2021)   . OLANZapine (ZYPREXA) 10 mg Oral Tablet Take 1 Tablet (10 mg total) by mouth Every night   . pantoprazole (PROTONIX) 40 mg Oral Tablet, Delayed Release (E.C.) TAKE 1 TABLET  (40 MG TOTAL) BY MOUTH TWICE A DAY 30 MINTUTES BEFORE MEALS FOR 90 DAYS   . rosuvastatin (CRESTOR) 5 mg Oral Tablet Take 1 Tablet (5 mg total) by mouth Every evening   . sertraline (ZOLOFT) 100 mg Oral Tablet Take 2 Tablets (200 mg total) by mouth Once a day for 30 days   . traZODone (DESYREL) 50 mg Oral Tablet Take 1 Tablet (50 mg total) by mouth Every night Unsure dosage     Past Medical History:   Diagnosis Date   . Alcohol abuse    . Cirrhosis (CMS [place])    . Coma (CMS [facility])     after fall due to [facility] May 2022   . Delirium, withdrawal, alcoholic (CMS HCC)    . Depression    . HTN (hypertension)    . Unknown cause of injury     fx nose   . Wears glasses      Social History     Socioeconomic History   . Marital status: Divorced   Tobacco Use   . Smoking status: Every Day     Packs/day: 1.00     Types: Cigarettes   . Smokeless tobacco: Never   Vaping Use   . Vaping Use: Never used   Substance and Sexual Activity   . Alcohol use: Not Currently     Alcohol/week: 15.0 standard drinks     Types: 15 Cans of beer per week  Comment:  may 17th, 2022    . Drug use: Yes     Types: Marijuana     Comment:  a joint or 2 a day    . Sexual activity: Yes      ROS: see HPI    Objective:    Resp 12   Ht 1.727 m ('5\' 8"'$ )   Wt 112 kg (247 lb)   BMI 37.56 kg/m     General: A&Ox3, no acute distress  HEENT: sclera anicteric  Heart: RRR, no murmurs  Lungs: clear, no wheezing or rhonchi, diminished, labored  Abdomen:  soft, bowel sounds present x 4, non-tender, fluid wave not present, nondistended  Extremities: 1-2+ lower extremity edema  Psych: no depression/anxiety    Labs:     Radiology:   Recent Results (from the past [id number] hour(s))   [place] RT UPPER QUADRANT    Collection Time: 10/22/21 10:24 AM    Narrative    Coralyn Mark LEE Ozaki    RADIOLOGIST: Jacob Sechrist    [place] RT UPPER QUADRANT performed on 10/22/2021 10:24 AM    CLINICAL HISTORY: K70.30: Alcoholic cirrhosis, unspecified whether ascites present (CMS  [place]).  Cirrhosis    COMPARISON:  CT chest, abdomen, and pelvis dated 05/03/2021.    FINDINGS:  Liver: Enlarged, heterogeneous. Nodular liver contour.    Gallbladder:  Cholelithiasis. No wall thickening or tenderness.    Common bile duct:  Normal measuring  2.23 mm.    Pancreas: Visualized portions are sonographically unremarkable.    Visualized portions of the right kidney are unremarkable.  No right upper quadrant ascites.        Impression    Cirrhotic liver morphology. No suspicious hepatic masses.    Cholelithiasis.        Radiologist location ID: [id number]       Pathology:No results found for this or any previous visit (from the past 720 hour(s)).     Problem List Items Addressed This Visit        Digestive    Alcoholic cirrhosis (CMS HCC) - Primary    Relevant Orders    ALPHA FETOPROTEIN (AFP) TUMOR MARKER    US LIVER   Other Visit Diagnoses     Barrett esophagus               Plan:   The patient was seen independently by myself with Dr. Merrie Roof available for consultation.    1. cirrhosis  - etiology- alcohol  - MELD- 9 based on labs dated 09/06/21. repeat labs after next visit  - ascites- none appreciated on exam. Titrate lasix and aldactone as kidneys will tolerate. Low salt diet. Avoid NSAIDs  - encephalopathy- none present.  - PHTN- last EGD 08/26/21- grade 1 esophageal varices. Repeat EGD in 1 year  - [place] screening- last imaging 05/03/21. last AFP 6 (09/06/21). Repeat every 6 months. Will obtain US/AFP  - immunizations- he is getting these through PCP    2. Barretts esophagus  - EGD 08/26/21- grade 1 varices, large inlet patch in upper esophagus, 1cm tongue of salmon colored mucosa in lower esophagus, diffuse portal gastropathy. Esophageal biopsy- goblet cell metaplasia, indefinite for dysplasia  - repeat EGD in 1 year  - he is not taking Protonix as recommended. He should be taking this BID  - smoking cessation advised    3. colon cancer surveillance  -colonoscopy 08/26/21- Fair prep. Two 3-5 mm polyps in  ascending colon-tubular adenoma and 1 minute fragment nonspecific crypt distortion; three 5-9 mm  polyps in transverse colon- tubular adenomas; 91m polyp in rectum- tubular adenoma.  - repeat colonoscopy in 2 years    4. lower extremity edema  - advised him to discuss with his PCP    Return to Clinic: Return in about 6 months (around 10/07/2022).    SKelton Pillar PA-C     DISCLAIMER: This note was partially created using voice recognition software which is subject to errors that may escape proof reading. Please pardon mistakes made by MModal. If typographical errors are noted and/or phrasing does not make sense, please contact me for corrections. I reserve the right to change note to correct mistakes related to MModal misinterpretation.

## 2022-04-22 ENCOUNTER — Other Ambulatory Visit: Payer: Self-pay

## 2022-04-22 ENCOUNTER — Ambulatory Visit (INDEPENDENT_AMBULATORY_CARE_PROVIDER_SITE_OTHER): Payer: MEDICAID | Admitting: Nurse Practitioner

## 2022-04-22 ENCOUNTER — Encounter (INDEPENDENT_AMBULATORY_CARE_PROVIDER_SITE_OTHER): Payer: Self-pay | Admitting: Nurse Practitioner

## 2022-04-22 VITALS — BP 142/80 | HR 63 | Temp 98.4°F | Resp 20 | Ht 69.0 in | Wt 244.0 lb

## 2022-04-22 DIAGNOSIS — Z716 Tobacco abuse counseling: Secondary | ICD-10-CM

## 2022-04-22 DIAGNOSIS — F17200 Nicotine dependence, unspecified, uncomplicated: Secondary | ICD-10-CM

## 2022-04-22 DIAGNOSIS — R0609 Other forms of dyspnea: Secondary | ICD-10-CM

## 2022-04-22 DIAGNOSIS — Z6836 Body mass index (BMI) 36.0-36.9, adult: Secondary | ICD-10-CM

## 2022-04-22 DIAGNOSIS — Z9981 Dependence on supplemental oxygen: Secondary | ICD-10-CM

## 2022-04-22 DIAGNOSIS — Z8709 Personal history of other diseases of the respiratory system: Secondary | ICD-10-CM

## 2022-04-22 NOTE — Progress Notes (Signed)
PULMONOLOGY, [address]  [place] [address]      Pulmonary Clinic Follow Up    Patient Name: Hector [name]  Date: 04/22/2022  Department:  PULMONOLOGY, [place]  MRN: [id number]  DOB: [birth year]     Follow Up 6 Months (Patient here for a 6 month follow up on dyspnea. Denies any issues at this time.).    History of Present Illness: The patient presents to the office today for a six month follow-up visit for  dyspnea, oxygen dependence, tobacco dependence.  Historically, the patient had Pulmonary Function Test (09/10/2021):  no obstructive lung disease noted, however, Advair HFA HFA 115-21 mcg 2 puffs twice a day was prescribed related to the patient's symptoms of dyspnea cough, wheezing and long history of tobacco daily use.  Today, the patient states that he does find benefit from using the prescribed Advair continues to use nebulizer treatments and Albuterol rescue inhaler, particularly, when he does exertion such as working in his gardening at home, patient currently wearing oxygen per nasal cannula today but does admit that he does not always use it when he has exertion,patient does use 2 Liters nasal cannula oxygen at night and feels he benefits from oxygen therapy.    Medications    Current Outpatient Medications:   .  ADVAIR HFA 115-21 mcg/actuation Inhalation oral inhaler, INHALE 2 PUFFS BY INHALATION ROUTE EVERY 12 HOURS., Disp: 12 Each, Rfl: 5  .  albuterol sulfate (PROAIR HFA) 90 mcg/actuation Inhalation HFA Aerosol Inhaler, Take 1-2 Puffs by inhalation Every 6 hours as needed for Other (SHORTNESS OF BREATH,COUGH,WHEEZING), Disp: 8.5 g, Rfl: 5  .  ipratropium-albuterol 0.5 mg-3 mg(2.5 mg base)/3 mL Solution for Nebulization, Take 3 mL by nebulization Three times a day 8 HOURS APART AS NEEDED FOR COUGH,SHORTNESS OF BREATH,WHEEZING, Disp: 90 Each, Rfl: 5  .  nystatin (NYSTOP) 100,000 unit/gram Powder, Apply topically Twice daily (Patient  not taking: Reported on 10/22/2021), Disp: , Rfl:   .  OLANZapine (ZYPREXA) 10 mg Oral Tablet, Take 1 Tablet (10 mg total) by mouth Every night, Disp: , Rfl:   .  pantoprazole (PROTONIX) 40 mg Oral Tablet, Delayed Release (E.C.), TAKE 1 TABLET (40 MG TOTAL) BY MOUTH TWICE A DAY 30 MINTUTES BEFORE MEALS FOR 90 DAYS, Disp: 60 Tablet, Rfl: 3  .  rosuvastatin (CRESTOR) 5 mg Oral Tablet, Take 1 Tablet (5 mg total) by mouth Every evening, Disp: 90 Tablet, Rfl: 4  .  sertraline (ZOLOFT) 100 mg Oral Tablet, Take 2 Tablets (200 mg total) by mouth Once a day for 30 days, Disp: 30 Tablet, Rfl: 0  .  traZODone (DESYREL) 50 mg Oral Tablet, Take 1 Tablet (50 mg total) by mouth Every night Unsure dosage, Disp: , Rfl:   Allergies  Allergies   Allergen Reactions   . Bee Venom Protein (Honey Bee)      Bee stings      Vitals  Vitals:    04/22/22 1244   BP: (!) 142/80   Pulse: 63   Resp: 20   Temp: 36.9 C (98.4 F)   SpO2: 94%   Weight: 111 kg (244 lb)   Height: 1.753 m ('5\' 9"'$ )   BMI: 36.11      There are no exam notes on file for this visit.   Past Medical History  Past Medical History:   Diagnosis Date   . Alcohol abuse    . Cirrhosis (CMS [place])    .  Coma (CMS [facility])     after fall due to [facility] May 2022   . Delirium, withdrawal, alcoholic (CMS HCC)    . Depression    . HTN (hypertension)    . Unknown cause of injury     fx nose   . Wears glasses          Past Surgical History:   Procedure Laterality Date   . COLONOSCOPY     . COLONOSCOPY WITH COLD SNARE POLYPECTOMY N/A 08/26/2021    Performed by Thamas Jaegers, MD at [facility]   . ESOPHAGOGASTRODUODENOSCOPY     . GASTROSCOPY WITH BIOPSY N/A 08/26/2021    Performed by Thamas Jaegers, MD at [facility]   . HX TONSILLECTOMY        Family Medical History:     Problem Relation (Age of Onset)    Asthma Father    Cerebral Aneurysm Sister          Social History     Socioeconomic History   . Marital status: Divorced   Tobacco Use   . Smoking status: Every Day     Packs/day: 1.00     Types:  Cigarettes   . Smokeless tobacco: Never   Vaping Use   . Vaping Use: Never used   Substance and Sexual Activity   . Alcohol use: Not Currently     Alcohol/week: 15.0 standard drinks     Types: 15 Cans of beer per week     Comment:  may 17th, 2022    . Drug use: Yes     Types: Marijuana     Comment:  a joint or 2 a day    . Sexual activity: Yes      Outpatient Medications:  Current Outpatient Medications   Medication Sig   . ADVAIR HFA 115-21 mcg/actuation Inhalation oral inhaler INHALE 2 PUFFS BY INHALATION ROUTE EVERY 12 HOURS.   [name] albuterol sulfate (PROAIR HFA) 90 mcg/actuation Inhalation HFA Aerosol Inhaler Take 1-2 Puffs by inhalation Every 6 hours as needed for Other (SHORTNESS OF BREATH,COUGH,WHEEZING)   . ipratropium-albuterol 0.5 mg-3 mg(2.5 mg base)/3 mL Solution for Nebulization Take 3 mL by nebulization Three times a day 8 HOURS APART AS NEEDED FOR COUGH,SHORTNESS OF BREATH,WHEEZING   . nystatin (NYSTOP) 100,000 unit/gram Powder Apply topically Twice daily (Patient not taking: Reported on 10/22/2021)   . OLANZapine (ZYPREXA) 10 mg Oral Tablet Take 1 Tablet (10 mg total) by mouth Every night   . pantoprazole (PROTONIX) 40 mg Oral Tablet, Delayed Release (E.C.) TAKE 1 TABLET (40 MG TOTAL) BY MOUTH TWICE A DAY 30 MINTUTES BEFORE MEALS FOR 90 DAYS   . rosuvastatin (CRESTOR) 5 mg Oral Tablet Take 1 Tablet (5 mg total) by mouth Every evening   . sertraline (ZOLOFT) 100 mg Oral Tablet Take 2 Tablets (200 mg total) by mouth Once a day for 30 days   . traZODone (DESYREL) 50 mg Oral Tablet Take 1 Tablet (50 mg total) by mouth Every night Unsure dosage       Review of system:  General:  No fever, chills, fatigue, weight gain, weight loss, appetite change.  No cold sweats at night.  Cardiac:  No chest pain, pressure, palpitations, hands or ankle edema.  Respiratory:  Positive shortness of breath, cough and wheezing.  No hemoptysis.    Neurological:  No headache or dizziness.  HEENT:  No visual changes,Positive nasal  congestion and post nasal drainage.  No neck stiffness, sore  throat or swollen glands.  GI:  No dysphagia, nausea, vomiting, constipation, diarrhea.  No blood in stool.  Urinary:  No blood in urine.  Skin:  No rash.    Imaging:    No X-rays ordered    Labs: No Labs ordered    Diagnostic Test: No diagnostic tests ordered      PHYSICAL EXAMINATION:   Constitutional:  Patient alert and oriented, no acute distress, no comfortable appearing.Obese,BMI 36.11  General appearance of the patient   HEENT:  Head is normocephalic, atraumatic.  Tympanic membranes are easily visualized , transparent and pearly gray in color with normal light reflex noted bilaterally.  Nasal turbinates are pink and moist without inflammation or erythema. Nasal vestibule free of rhinorrhea.  Nasal vestibule shows evidence of nicotine stain mucosa  No tenderness upon palpation of sinus areas. Oral mucosa is pink and moist without lesions, no pharyngeal erythremia or exudate noted.  Trachea midline Neck supple without masses, no lymphadenopathy.  No jugular vein distention.  No thyromegaly.   Cardiovascular:   Regular rate, rhythm, normal S1, S2 heard, no Murmurs, rubs, or gallops auscultated.  No carotid bruit.  No ankle edema.  Lungs:  Clear to ascultation anterior and posterior, non labored respirations, no wheezes, rales or rhonchi, Thorax is symmetrical, chest walls expand equally, no pain upon palpation of upper anterior thorax, expirations not prolonged, no intercostal retractions.  Effort good, No Egophony, No dullness upon percussion.Decreased airflow noted throughout lung fields  Abdomen: Abdomen soft, non-distended, non-tender.  Musc: Normal gait and station, no digital cyanosis or clubbing.  Skin:  Color flesh tone.  Skin warm and dry.  No ecchymosis seen, nail beds pink, no clubbing, capillary refill less than four seconds, no rash, lesion or ulcers.   Psych:  Normal affect, interaction, and speech, cooperative, informative,  pleasant.    Assessment:  (R06.09) Dyspnea on exertion  (primary encounter diagnosis)    (F17.200) Current smoker    (Z71.6) Tobacco abuse counseling    (Z87.09) History of respiratory failure    (Z99.81) Dependence on supplemental oxygen       Plan:    Dyspnea,patient reports benefit from prescribed Advair  Continue current medication regimen  Continue oxygen with exertion and at night,patient benefits from oxygen therapy  Again,smoking cessation discussed with the patient and the risk of smoking for well-being,patient desires to smoke cigarettes    The patient was given the opportunity to ask questions and those questions were answered to the patient's satisfaction. The patient was encouraged to call with any additional questions or concerns.   Discussed with patient effects and side effects of medications. Medication safety was discussed  Detailed time spent for preparation of today's encounter,review of documents, interviewing/examining the patient,providing education/counselling,completing electronic orders and progress notes for the patient's plan of care.      Electronically signed by Lindwood Coke, CFNP  Pulmonary

## 2022-04-24 ENCOUNTER — Other Ambulatory Visit: Payer: Self-pay | Admitting: Family Medicine

## 2022-04-24 ENCOUNTER — Other Ambulatory Visit: Payer: Self-pay

## 2022-04-24 ENCOUNTER — Inpatient Hospital Stay
Admission: RE | Admit: 2022-04-24 | Discharge: 2022-04-24 | Disposition: A | Discharge status: Home / Self Care | Payer: MEDICAID | Source: Ambulatory Visit | Attending: Medical | Admitting: Medical

## 2022-04-24 DIAGNOSIS — K7030 Alcoholic cirrhosis of liver without ascites: Secondary | ICD-10-CM | POA: Insufficient documentation

## 2022-04-24 DIAGNOSIS — G629 Polyneuropathy, unspecified: Secondary | ICD-10-CM

## 2022-05-12 ENCOUNTER — Encounter: Payer: Self-pay | Admitting: Internal Medicine

## 2022-05-12 ENCOUNTER — Other Ambulatory Visit: Payer: Self-pay | Admitting: Internal Medicine

## 2022-05-13 MED ORDER — ROSUVASTATIN CALCIUM 20 MG PO TABS: 20.0000 mg | ORAL_TABLET | Freq: Every day | ORAL | 0 refills | Status: DC

## 2022-05-14 NOTE — Telephone Encounter (Signed)
Thank you, sorry about that lol

## 2022-05-28 ENCOUNTER — Encounter (INDEPENDENT_AMBULATORY_CARE_PROVIDER_SITE_OTHER): Payer: Self-pay | Admitting: Internal Medicine

## 2022-05-28 ENCOUNTER — Ambulatory Visit: Payer: MEDICAID | Attending: Internal Medicine | Admitting: Internal Medicine

## 2022-05-28 ENCOUNTER — Other Ambulatory Visit: Payer: Self-pay

## 2022-05-28 VITALS — BP 134/70 | HR 65 | Resp 20 | Ht 69.0 in | Wt 250.0 lb

## 2022-05-28 DIAGNOSIS — Z23 Encounter for immunization: Secondary | ICD-10-CM | POA: Insufficient documentation

## 2022-05-28 DIAGNOSIS — J449 Chronic obstructive pulmonary disease, unspecified: Secondary | ICD-10-CM | POA: Insufficient documentation

## 2022-05-28 DIAGNOSIS — Z6836 Body mass index (BMI) 36.0-36.9, adult: Secondary | ICD-10-CM

## 2022-05-28 DIAGNOSIS — I10 Essential (primary) hypertension: Secondary | ICD-10-CM

## 2022-05-28 DIAGNOSIS — M542 Cervicalgia: Secondary | ICD-10-CM | POA: Insufficient documentation

## 2022-05-28 DIAGNOSIS — K7460 Unspecified cirrhosis of liver: Secondary | ICD-10-CM | POA: Insufficient documentation

## 2022-05-28 DIAGNOSIS — Z720 Tobacco use: Secondary | ICD-10-CM

## 2022-05-28 MED ORDER — ALBUTEROL SULFATE HFA 90 MCG/ACTUATION AEROSOL INHALER
1.0000 | INHALATION_SPRAY | Freq: Four times a day (QID) | RESPIRATORY_TRACT | 5 refills | Status: DC | PRN
Start: 2022-05-28 — End: 2022-11-03

## 2022-05-28 MED ORDER — IPRATROPIUM 0.5 MG-ALBUTEROL 3 MG (2.5 MG BASE)/3 ML NEBULIZATION SOLN
3.0000 mL | INHALATION_SOLUTION | Freq: Three times a day (TID) | RESPIRATORY_TRACT | 5 refills | Status: DC
Start: 2022-05-28 — End: 2022-07-24

## 2022-05-28 NOTE — Progress Notes (Signed)
[place]  Operated by [facility]  Progress Note    Name: Hector [name] MRN:  [id number]   Date: 05/28/2022 Age: 61 y.o.       Chief Complaint: Follow Up (Patient here for 2 month follow up, patient c/o neck and R shoulder blade have been painful for several weeks, weight up 6 lbs, patient is accompanied by significant other [name], patient has had several falls, )    Subjective:   Patient complains of neck pain radiating to his right side of the shoulder no fall recently.    Denies any headache earache sore throat     A and his significant other denies any alcohol use still smoking  Objective :  BP 134/70 (Site: Right, Patient Position: Sitting, Cuff Size: Adult)   Pulse 65   Resp 20   Ht 1.753 m ('5\' 9"'$ )   Wt 113 kg (250 lb)   SpO2 93%   BMI 36.92 kg/m     Patient alert oriented x3     Neck no injury no bruits     Lungs clear     Heart S1 no murmur     Abdomen soft obese nontender no mass palpated     Extremities Homans negative no pretibial pitting edema    Musculoskeletal examination some pain with neck lateral rotation    Mood and affect appear normal    Data reviewed:    Current Outpatient Medications   Medication Sig   . ADVAIR HFA 115-21 mcg/actuation Inhalation oral inhaler INHALE 2 PUFFS BY INHALATION ROUTE EVERY 12 HOURS.   [name] albuterol sulfate (PROAIR HFA) 90 mcg/actuation Inhalation oral inhaler Take 1-2 Puffs by inhalation Every 6 hours as needed for Other (SHORTNESS OF BREATH,COUGH,WHEEZING)   . ipratropium-albuterol 0.5 mg-3 mg(2.5 mg base)/3 mL Solution for Nebulization Take 3 mL by nebulization Three times a day 8 HOURS APART AS NEEDED FOR COUGH,SHORTNESS OF BREATH,WHEEZING   . nystatin (NYSTOP) 100,000 unit/gram Powder Apply topically Twice daily (Patient not taking: Reported on 10/22/2021)   . OLANZapine (ZYPREXA) 10 mg Oral Tablet Take 1 Tablet (10 mg total) by mouth Every night   . pantoprazole (PROTONIX) 40 mg Oral Tablet, Delayed Release (E.C.)  TAKE 1 TABLET (40 MG TOTAL) BY MOUTH TWICE A DAY 30 MINTUTES BEFORE MEALS FOR 90 DAYS   . rosuvastatin (CRESTOR) 5 mg Oral Tablet Take 1 Tablet (5 mg total) by mouth Every evening   . sertraline (ZOLOFT) 100 mg Oral Tablet Take 2 Tablets (200 mg total) by mouth Once a day for 30 days   . 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 Neck pain probably musculoskeletal, physical therapy might help script given call if not better    2. COPD continues  to smoke he is on oxygen advised to stop smoking medications refilled follow-up with pulmonology     3. Mental disorder he is under care of psychiatrist continue same    4. Hypertension controlled continue same medication    5. Alcoholic cirrhosis of the liver the importance stay away from alcohol hepatitis B 2nd dose today    Jenkins Rouge, MD

## 2022-05-28 NOTE — Patient Instructions (Addendum)
Continue same meds     Consider PT

## 2022-07-24 ENCOUNTER — Other Ambulatory Visit (INDEPENDENT_AMBULATORY_CARE_PROVIDER_SITE_OTHER): Payer: Self-pay | Admitting: Nurse Practitioner

## 2022-07-24 DIAGNOSIS — J449 Chronic obstructive pulmonary disease, unspecified: Secondary | ICD-10-CM

## 2022-08-05 ENCOUNTER — Other Ambulatory Visit: Payer: Self-pay | Admitting: Internal Medicine

## 2022-08-19 ENCOUNTER — Telehealth (INDEPENDENT_AMBULATORY_CARE_PROVIDER_SITE_OTHER): Payer: Self-pay | Admitting: Internal Medicine

## 2022-08-19 NOTE — Telephone Encounter (Signed)
LVM FOR PT TO CALL OFFICE & SCHD EGD W/ NASR Christina Dewess

## 2022-08-21 ENCOUNTER — Ambulatory Visit: Payer: 59 | Attending: Internal Medicine | Admitting: Internal Medicine

## 2022-08-21 ENCOUNTER — Encounter: Payer: Self-pay | Admitting: Internal Medicine

## 2022-08-21 VITALS — BP 110/70 | HR 69 | Ht 65.0 in | Wt 147.8 lb

## 2022-08-21 DIAGNOSIS — E7800 Pure hypercholesterolemia, unspecified: Secondary | ICD-10-CM | POA: Diagnosis not present

## 2022-08-21 DIAGNOSIS — I2510 Atherosclerotic heart disease of native coronary artery without angina pectoris: Secondary | ICD-10-CM

## 2022-08-21 DIAGNOSIS — R002 Palpitations: Secondary | ICD-10-CM | POA: Diagnosis not present

## 2022-08-21 DIAGNOSIS — Z79899 Other long term (current) drug therapy: Secondary | ICD-10-CM

## 2022-08-21 MED ORDER — ROSUVASTATIN CALCIUM 20 MG PO TABS: 20.0000 mg | ORAL_TABLET | Freq: Every day | ORAL | 3 refills | Status: DC

## 2022-08-21 NOTE — Progress Notes (Signed)
Cardiology Office Note    Date:  08/21/2022   ID:  Curtis Luna, DOB [birth year], MRN [id number]  PCP:  Darreld Mclean, MD  Cardiologist:  Elouise Munroe, MD  Electrophysiologist:  None   Evaluation Performed:  Follow-Up Visit  Chief Complaint:  Palpitations, CAD  History of Present Illness:    Curtis Luna is a 61 y.o. male with ocular migraines, PACs, hyperlipidemia, GERD, thyroid nodules, who presents for follow-up.  Did not tolerate metoprolol due to bradycardia/hypotension. CCTA showed moderate CAD, moderate CAC score 213. 79th percentile, discussed risk and secondary prevention of CAD. Tried doubling pravastatin but felt sluggish. On high intensity statin therapy and ASA.   Doing well overall. The patient denies chest pain, chest pressure, dyspnea at rest or with exertion, palpitations, PND, orthopnea, or leg swelling. Denies cough, fever, chills. Denies nausea, vomiting. Denies syncope or presyncope. Denies dizziness or lightheadedness.   Past Medical History:  Diagnosis Date   Abdominal pain, right lower quadrant    Acute bronchospasm    Allergy    Anxiety    on and off   Backache, unspecified    Chicken pox    Diverticulosis of colon (without mention of hemorrhage)    Esophageal reflux    External hemorrhoids without mention of complication    thrombosed   Hiatal hernia    Hyperlipidemia    no medicines    Inguinal hernia without mention of obstruction or gangrene, unilateral or unspecified, (not specified as recurrent)    Migraine    Obstructive sleep apnea (adult) (pediatric)    Other symptoms involving digestive system(787.99)    Pain in joint, forearm    Pain in joint, shoulder region    Seasonal allergies    Sleep apnea    no cpap but uses dental device    Thoracic or lumbosacral neuritis or radiculitis, unspecified    Past Surgical History:  Procedure Laterality Date   COLON RESECTION  10/06   sigmoid colon   COLONOSCOPY     INGUINAL  HERNIA REPAIR  12/2012   Left   UPPER GASTROINTESTINAL ENDOSCOPY     WISDOM TOOTH EXTRACTION       Current Meds  Medication Sig   Ascorbic Acid (VITAMIN C) 1000 MG tablet Take 1,000 mg by mouth daily.   b complex vitamins tablet Take 1 tablet by mouth daily. Every other day   cholecalciferol (VITAMIN D3) 25 MCG (1000 UT) tablet Take 1,000 Units by mouth daily.   gabapentin (NEURONTIN) 100 MG capsule TAKE 1 CAPSULE BY MOUTH 3 TIMES  DAILY   LORazepam (ATIVAN) 1 MG tablet Take 1 tablet (1 mg total) by mouth as needed for anxiety. May take 1 per day   Multiple Vitamin (MULTIVITAMIN) tablet Take 1 tablet by mouth daily. Men's 50 plus multivitamin daily   raNITIdine HCl (ZANTAC PO) Take by mouth.   [DISCONTINUED] rosuvastatin (CRESTOR) 20 MG tablet Take 1 tablet (20 mg total) by mouth daily.     Allergies:   Escitalopram and Protonix [pantoprazole sodium]   Social History   Tobacco Use   Smoking status: Former    Types: Cigarettes, Cigars    Quit date: 10/04/1987    Years since quitting: 34.9   Smokeless tobacco: Never  Vaping Use   Vaping Use: Never used  Substance Use Topics   Alcohol use: Yes    Alcohol/week: 7.0 - 14.0 standard drinks of alcohol    Types: 7 - 14 Standard drinks or equivalent  per week    Comment: 2 daily    Drug use: No     Family Hx: The patient's family history includes Allergies in his daughter; Aneurysm in his sister; Diabetes in his maternal aunt; Healthy in his brother; Heart attack in his paternal grandmother; Heart disease in his paternal grandmother; Lung cancer (age of onset: 49) in his mother; Migraines in his daughter; Other in his father. There is no history of Colon cancer, Colon polyps, Esophageal cancer, Rectal cancer, Stomach cancer, Pancreatic cancer, Allergic rhinitis, Angioedema, Asthma, Atopy, Eczema, Immunodeficiency, or Urticaria.  ROS:   Please see the history of present illness.    All other systems reviewed and are  negative.   Prior CV studies:   The following studies were reviewed today: Monitor 03/12/2020:  Indication: palpitations Impression: Rare supraventricular and ventricular ectopy, diary events associated with isolated ventricular ectopic beats.   ECHO 03/09/2018: Left ventricle: The cavity size was normal. Systolic function was    normal. The estimated ejection fraction was in the range of 55%    to 60%. Wall motion was normal; there were no regional wall    motion abnormalities. Left ventricular diastolic function    parameters were normal.   Labs/Other Tests and Data Reviewed:    EKG:  today: NSR 06/13/2021: normal sinus rhythm, rate of 68 10/30/2020: EKG was not ordered.  Recent Labs: 01/23/2022: ALT 24; BUN 18; Creatinine, Ser 0.98; Hemoglobin 14.1; Platelets 267.0; Potassium 4.4; Sodium 142; TSH 1.68   Recent Lipid Panel Lab Results  Component Value Date/Time   CHOL 130 01/23/2022 08:37 AM   CHOL 154 10/12/2020 09:02 AM   TRIG 76.0 01/23/2022 08:37 AM   HDL 52.70 01/23/2022 08:37 AM   HDL 52 10/12/2020 09:02 AM   CHOLHDL 2 01/23/2022 08:37 AM   LDLCALC 62 01/23/2022 08:37 AM   LDLCALC 83 10/12/2020 09:02 AM   LDLDIRECT 146 (H) 10/17/2014 09:30 AM    Wt Readings from Last 3 Encounters:  08/21/22 147 lb 12.8 oz (67 kg)  04/02/22 148 lb (67.1 kg)  01/23/22 151 lb 12.8 oz (68.9 kg)    Objective:    Vital Signs:  BP 110/70   Pulse 69   Ht '5\' 5"'$  (1.651 m)   Wt 147 lb 12.8 oz (67 kg)   SpO2 97%   BMI 24.60 kg/m    Constitutional: No acute distress Eyes: sclera non-icteric, normal conjunctiva and lids ENMT: normal dentition, moist mucous membranes Cardiovascular: regular rhythm, normal rate, no murmurs. S1 and S2 normal. Radial pulses normal bilaterally. No jugular venous distention.  Respiratory: clear to auscultation bilaterally GI : normal bowel sounds, soft and nontender. No distention.   MSK: extremities warm, well perfused. No edema.  NEURO: grossly  nonfocal exam, moves all extremities. PSYCH: alert and oriented x 3, normal mood and affect.    ASSESSMENT & PLAN:    1. Coronary artery disease involving native coronary artery of native heart without angina pectoris   2. Pure hypercholesterolemia   3. Palpitations   4. Medication management     CAD - moderate nonobstructive CAD - continue ASA 81 mg daily and statin. No significant chest pain.  HLD - lipids improved, and at goal. Continue statin. Refills provided today.   Palpitations  - stable overall, continue to observe. Low risk cardiac monitor. Did not tolerate metoprolol but overall feeling well and trying to avoid triggers.    Total time of encounter: 25 minutes total time of encounter, including 15 minutes spent  in face-to-face patient care on the date of this encounter. This time includes coordination of care and counseling regarding above mentioned problem list. Remainder of non-face-to-face time involved reviewing chart documents/testing relevant to the patient encounter and documentation in the medical record. I have independently reviewed documentation from referring provider.   Cherlynn Kaiser, MD, [facility]   Medication Adjustments/Labs and Tests Ordered: Current medicines are reviewed at length with the patient today.  Concerns regarding medicines are outlined above.   Patient Instructions  Medication Instructions:  No Changes In Medications at this time.  *If you need a refill on your cardiac medications before your next appointment, please call your pharmacy*  Follow-Up: At [facility], you and your health needs are our priority.  As part of our continuing mission to provide you with exceptional heart care, we have created designated Provider Care Teams.  These Care Teams include your primary Cardiologist (physician) and Advanced Practice Providers (APPs -  Physician Assistants and Nurse Practitioners) who all work together to  provide you with the care you need, when you need it.  Your next appointment:   1 year(s)  The format for your next appointment:   In Person  Provider:   Elouise Munroe, MD

## 2022-08-21 NOTE — Patient Instructions (Addendum)
Medication Instructions:  No Changes In Medications at this time.  *If you need a refill on your cardiac medications before your next appointment, please call your pharmacy*  Follow-Up: At [facility], you and your health needs are our priority.  As part of our continuing mission to provide you with exceptional heart care, we have created designated Provider Care Teams.  These Care Teams include your primary Cardiologist (physician) and Advanced Practice Providers (APPs -  Physician Assistants and Nurse Practitioners) who all work together to provide you with the care you need, when you need it.  Your next appointment:   1 year(s)  The format for your next appointment:   In Person  Provider:   Gayatri A Acharya, MD          

## 2022-08-22 ENCOUNTER — Other Ambulatory Visit (INDEPENDENT_AMBULATORY_CARE_PROVIDER_SITE_OTHER): Payer: Self-pay | Admitting: Nurse Practitioner

## 2022-08-22 ENCOUNTER — Other Ambulatory Visit (INDEPENDENT_AMBULATORY_CARE_PROVIDER_SITE_OTHER): Payer: Self-pay | Admitting: PHYSICIAN ASSISTANT

## 2022-08-22 DIAGNOSIS — F17200 Nicotine dependence, unspecified, uncomplicated: Secondary | ICD-10-CM

## 2022-08-22 DIAGNOSIS — Z8709 Personal history of other diseases of the respiratory system: Secondary | ICD-10-CM

## 2022-08-22 DIAGNOSIS — R0609 Other forms of dyspnea: Secondary | ICD-10-CM

## 2022-09-05 ENCOUNTER — Encounter (INDEPENDENT_AMBULATORY_CARE_PROVIDER_SITE_OTHER): Payer: Self-pay

## 2022-09-05 ENCOUNTER — Telehealth (INDEPENDENT_AMBULATORY_CARE_PROVIDER_SITE_OTHER): Payer: Self-pay | Admitting: Internal Medicine

## 2022-09-05 NOTE — Telephone Encounter (Signed)
PT SCHD W/ NASR FOR EGD esophageal varices , 09/30/22 @ 1230, -B/T, INSTRUCTIONS GIVEN VIA MYCHART, NAR Christina Dewess

## 2022-09-17 ENCOUNTER — Other Ambulatory Visit: Payer: MEDICAID | Attending: Internal Medicine

## 2022-09-17 ENCOUNTER — Other Ambulatory Visit: Payer: Self-pay

## 2022-09-17 DIAGNOSIS — E785 Hyperlipidemia, unspecified: Secondary | ICD-10-CM | POA: Insufficient documentation

## 2022-09-17 DIAGNOSIS — K7030 Alcoholic cirrhosis of liver without ascites: Secondary | ICD-10-CM | POA: Insufficient documentation

## 2022-09-17 LAB — LIPID PANEL
CHOLESTEROL: 165 mg/dL (ref 0–200)
HDL CHOL: 23 mg/dL — ABNORMAL LOW (ref >40)
LDL CALC: 111 mg/dL — ABNORMAL HIGH (ref <100)
TRIGLYCERIDES: 154 mg/dL — ABNORMAL HIGH (ref <150)

## 2022-09-17 LAB — ALPHA FETOPROTEIN (AFP) TUMOR MARKER: AFP TUMOR MARKER: 5 ng/mL (ref <=9)

## 2022-09-17 LAB — AST (SGOT): AST (SGOT): 48 U/L (ref 17–59)

## 2022-09-25 ENCOUNTER — Other Ambulatory Visit (HOSPITAL_BASED_OUTPATIENT_CLINIC_OR_DEPARTMENT_OTHER): Payer: Self-pay

## 2022-09-25 MED ORDER — MELOXICAM 15 MG PO TABS
15.0000 mg | ORAL_TABLET | Freq: Every day | ORAL | 0 refills | Status: DC
  Filled 2022-09-25: qty 30, 30d supply, fill #0

## 2022-09-25 MED ORDER — TIZANIDINE HCL 4 MG PO TABS
4.0000 mg | ORAL_TABLET | Freq: Four times a day (QID) | ORAL | 0 refills | Status: DC | PRN
  Filled 2022-09-25: qty 30, 8d supply, fill #0

## 2022-09-29 ENCOUNTER — Encounter (INDEPENDENT_AMBULATORY_CARE_PROVIDER_SITE_OTHER): Payer: Self-pay | Admitting: Internal Medicine

## 2022-09-29 ENCOUNTER — Other Ambulatory Visit: Payer: Self-pay

## 2022-09-29 ENCOUNTER — Ambulatory Visit: Payer: MEDICAID | Attending: Internal Medicine | Admitting: Internal Medicine

## 2022-09-29 ENCOUNTER — Telehealth (INDEPENDENT_AMBULATORY_CARE_PROVIDER_SITE_OTHER): Payer: Self-pay | Admitting: Internal Medicine

## 2022-09-29 VITALS — BP 140/72 | HR 61 | Temp 97.4°F | Resp 24 | Ht 69.0 in | Wt 254.0 lb

## 2022-09-29 DIAGNOSIS — Z0000 Encounter for general adult medical examination without abnormal findings: Secondary | ICD-10-CM | POA: Insufficient documentation

## 2022-09-29 DIAGNOSIS — Z23 Encounter for immunization: Secondary | ICD-10-CM | POA: Insufficient documentation

## 2022-09-29 DIAGNOSIS — L304 Erythema intertrigo: Secondary | ICD-10-CM | POA: Insufficient documentation

## 2022-09-29 MED ORDER — NYSTATIN 100,000 UNIT/GRAM TOPICAL POWDER
Freq: Two times a day (BID) | CUTANEOUS | 1 refills | Status: DC
Start: 2022-09-29 — End: 2022-12-16

## 2022-09-29 NOTE — Patient Instructions (Signed)
Call [place] quit line for help quitting smoking     Continue same meds

## 2022-09-29 NOTE — Telephone Encounter (Signed)
LVM INFORMING PT SX TIME IS NOW 1030 AND ARRIVAL TIME IS 9AM FOR EGD W/ NASR 09/30/22 Christina Dewess

## 2022-09-29 NOTE — Progress Notes (Signed)
[place]  Operated by [facility]  Progress Note    Name: Hector [name] MRN:  [id number]   Date: 09/29/2022 Age: 61 y.o.       Chief Complaint: Follow Up 4 Months (Patient here for follow up visit, patient states he is doing okay, patient states he has had a rash in his groin area for a while, also L sided abdominal pain, patient is accompanied by spouse, patient is requesting something to help him quit smoking, )    Subjective:   Patient here for his regular checkup and also his annual exam.      Still smoking    No cough wheezing     No chest pain dyspnea on exertion    No abdominal pain nausea vomiting     Has a rash in his groin area  Objective :  BP (!) 140/72 (Site: Right, Patient Position: Sitting, Cuff Size: Adult Large)   Pulse 61   Temp 36.3 C (97.4 F) (Tympanic)   Resp (!) 24   Ht 1.753 m ('5\' 9"'$ )   Wt 115 kg (254 lb)   SpO2 93%   BMI 37.51 kg/m       Patient alert oriented x3     Neck no JVD no bruits     Lungs expiratory wheezing scattered rhonchi     Heart S1-S2 no murmur     Abdomen soft nondistended nontender     Extremities trace edema    Mood and affect appear normal  Data reviewed:    Current Outpatient Medications   Medication Sig    ADVAIR HFA 115-21 mcg/actuation Inhalation oral inhaler INHALE 2 PUFFS BY MOUTH EVERY 12 HOURS    albuterol sulfate (PROAIR HFA) 90 mcg/actuation Inhalation oral inhaler Take 1-2 Puffs by inhalation Every 6 hours as needed for Other (SHORTNESS OF BREATH,COUGH,WHEEZING)    ipratropium-albuterol 0.5 mg-3 mg(2.5 mg base)/3 mL Solution for Nebulization NEBULIZE 1 VIAL 3 TIMES A DAY (8 HOURS APART) AS NEEDED FOR COUGH, SHORTNESS OF BREATH, WHEEZING    nystatin (NYSTOP) 100,000 unit/gram Powder Apply topically Twice daily    OLANZapine (ZYPREXA) 10 mg Oral Tablet Take 1 Tablet (10 mg total) by mouth Every night    pantoprazole (PROTONIX) 40 mg Oral Tablet, Delayed Release (E.C.) TAKE 1 TABLET (40 MG TOTAL) BY MOUTH  TWICE A DAY 30 MINTUTES BEFORE MEALS FOR 90 DAYS    rosuvastatin (CRESTOR) 5 mg Oral Tablet Take 1 Tablet (5 mg total) by mouth Every evening    sertraline (ZOLOFT) 100 mg Oral Tablet Take 2 Tablets (200 mg total) by mouth Once a day for 30 days    traZODone (DESYREL) 50 mg Oral Tablet Take 1 Tablet (50 mg total) by mouth Every night Unsure dosage     Assessment/Plan  Problem List Items Addressed This Visit          Other    Wellness examination - Primary (Chronic)     Other Visit Diagnoses       Intertrigo              1. Wellness exam again discussed about smoking cessation told him to call [place] quit Line to get patches or gums when he can start.      It is very important that he quit smoking. There are various alternatives available to help with this difficult task, but first and foremost, he must make a firm commitment and decision to quit. The nature  of nicotine addiction is discussed. The usefulness of behavioral therapy is discussed and suggested.  The correct use, cost and side effects of nicotine replacement therapy, such as gum or patches, are discussed. Zyban and its cost and side effects are reviewed. The quit rates are discussed. I recommend he not allow costs of treatment to deter him from using NRT or Zyban, as the long-term economic and health benefits are obvious.     2. Intertrigo he has obesity use nystatin powder weight loss advised    4. Flu vaccination today    I have reviewed vitals, flowsheets, screening tests and visit documentation of annual wellness visit and concur with clinical staff on their assessments. I have examined the patient and discussed the findings with the patient/family. Answered all their questions and concerns.  Patient/family have been advised about complaince with age appropriate screening tests and vaccinations. Counseled regarding healthy diet/habits/activities .  Risk factors were discussed and plan of care explained . Written prevention plan  completed/updated and given to patient. Relevant education materials given.  Follow-up as needed/documented in chart.    Jenkins Rouge, MD       Jenkins Rouge, MD

## 2022-09-30 ENCOUNTER — Encounter (HOSPITAL_COMMUNITY)
Admission: RE | Disposition: A | Discharge status: Home / Self Care | Payer: Self-pay | Source: Ambulatory Visit | Attending: Internal Medicine

## 2022-09-30 ENCOUNTER — Encounter (HOSPITAL_COMMUNITY): Payer: MEDICAID | Admitting: Internal Medicine

## 2022-09-30 ENCOUNTER — Inpatient Hospital Stay
Admission: RE | Admit: 2022-09-30 | Discharge: 2022-09-30 | Disposition: A | Discharge status: Home / Self Care | Payer: MEDICAID | Source: Ambulatory Visit | Attending: Internal Medicine | Admitting: Internal Medicine

## 2022-09-30 ENCOUNTER — Other Ambulatory Visit: Payer: Self-pay

## 2022-09-30 ENCOUNTER — Ambulatory Visit (HOSPITAL_COMMUNITY): Payer: MEDICAID | Admitting: Certified Registered"

## 2022-09-30 ENCOUNTER — Encounter (HOSPITAL_COMMUNITY): Payer: Self-pay | Admitting: Internal Medicine

## 2022-09-30 DIAGNOSIS — I8500 Esophageal varices without bleeding: Secondary | ICD-10-CM | POA: Insufficient documentation

## 2022-09-30 DIAGNOSIS — K2270 Barrett's esophagus without dysplasia: Secondary | ICD-10-CM | POA: Insufficient documentation

## 2022-09-30 DIAGNOSIS — K3189 Other diseases of stomach and duodenum: Secondary | ICD-10-CM

## 2022-09-30 HISTORY — DX: Chronic obstructive pulmonary disease, unspecified: J44.9

## 2022-09-30 SURGERY — GASTROSCOPY WITH BIOPSY
Anesthesia: Monitor Anesthesia Care | Wound class: Clean Contaminated Wounds-The respiratory, GI, Genital, or urinary

## 2022-09-30 MED ORDER — LACTATED RINGERS INTRAVENOUS SOLUTION
INTRAVENOUS | Status: DC
Start: 2022-09-30 — End: 2022-09-30
  Administered 2022-09-30: 0 via INTRAVENOUS

## 2022-09-30 MED ORDER — SODIUM CHLORIDE 0.9 % (FLUSH) INJECTION SYRINGE
10.0000 mL | INJECTION | INTRAMUSCULAR | Status: DC | PRN
Start: 2022-09-30 — End: 2022-09-30

## 2022-09-30 MED ORDER — HYDRALAZINE 20 MG/ML INJECTION SOLUTION
5.0000 mg | INTRAMUSCULAR | Status: DC | PRN
Start: 2022-09-30 — End: 2022-09-30

## 2022-09-30 MED ORDER — PROPOFOL 10 MG/ML IV BOLUS
INJECTION | Freq: Once | INTRAVENOUS | Status: DC | PRN
Start: 2022-09-30 — End: 2022-09-30
  Administered 2022-09-30: 50 mg via INTRAVENOUS
  Administered 2022-09-30: 80 mg via INTRAVENOUS

## 2022-09-30 MED ORDER — SODIUM CHLORIDE 0.9 % (FLUSH) INJECTION SYRINGE
10.0000 mL | INJECTION | Freq: Three times a day (TID) | INTRAMUSCULAR | Status: DC
Start: 2022-09-30 — End: 2022-09-30

## 2022-09-30 MED ORDER — DROPERIDOL 2.5 MG/ML INJECTION SOLUTION
0.6250 mg | Freq: Once | INTRAMUSCULAR | Status: DC | PRN
Start: 2022-09-30 — End: 2022-09-30

## 2022-09-30 SURGICAL SUPPLY — 88 items
ADAPTER CATH 11/32IN 1/8-3/8IN PLASTIC LL SYRG CONN POS GRIP RIDGE STRL LF  3/32IN TAPER (UROLOGICAL SUPPLIES)
ADAPTER CATH 11/32IN 1/8-3/8IN_3/32IN TPR PLASTIC LL SYRG (UROLOGICAL SUPPLIES)
BASIN EME 16OZ 9X3.8X2IN GRAD_FLXB DISP DST ROSE POLYPROP (MED SURG SUPPLIES)
BASIN EME 8.4X3.8X2IN GRAD DISP DST ROSE POLYPROP C500ML LF (MED SURG SUPPLIES) IMPLANT
BLOCK BITE 54FR ELAS ADJ STRAP BLOX (ENDOSCOPIC SUPPLIES) ×1 IMPLANT
BRUSH CYTO 3MM 2.1MM 230CM SHEATH BRSTL POS STP THB RING (INSTRUMENTS ENDOMECHANICAL)
BRUSH CYTO 3MM 2.1MM 230CM SHEATH BRSTL POS STP THB RING CNMD STRL DISP LF  CSCP (ENDOSCOPIC SUPPLIES) IMPLANT
CAN SUCT 1200CC LF (MED SURG SUPPLIES) IMPLANT
CATH ELHMST GLD PROBE 10FR 300CM BIPOLAR RND DIST TIP FIRM (INSTRUMENTS ENDOMECHANICAL)
CATH ELHMST GLD PROBE 10FR 300CM BIPOLAR RND DIST TIP FIRM SHAFT STD CONN HMGLD STRL DISP 3.7MM MN (ENDOSCOPIC SUPPLIES) IMPLANT
CATH ELHMST GLD PROBE 7FR 300CM BIPOLAR RND DIST TIP STD CONN FIRM SHAFT HMGLD STRL DISP 2.8MM MN (ENDOSCOPIC SUPPLIES) IMPLANT
CATH ELHMST GLD PROBE 7FR 300C_M BIPOLAR RND DIST TIP STD (INSTRUMENTS ENDOMECHANICAL)
CATH RGFLX II 40MM 10CM 90CM RADOPQ BAL DIL LF  ACPT .038IN GW ACHL (BALLOON) IMPLANT
CATH RGFLX II 40MM 10CM 90CM RADOPQ BAL DIL LF ACPT .038IN (BALLOON)
CLIP LGT RSL 360 ULTRA 235CM BRD ROT CONTROL KNOB 17MM OPN (ENDOSCOPIC SUPPLIES)
CONV USE 48075 - SYRINGE 60ML LF  STRL LL MED (MED SURG SUPPLIES) IMPLANT
DEVICE INFLAT CRE STERI-FLATE DISP (ENDOSCOPIC SUPPLIES) IMPLANT
DEVICE INFLAT CRE STERI-FLATE_DISP (INSTRUMENTS ENDOMECHANICAL)
DEVICE IRRG BIOVAC (ENDOSCOPIC SUPPLIES) IMPLANT
DEVICE SUT OVERSTICH SUT CNCH 6 UNIT STRL DISP (ENDOSCOPIC SUPPLIES)
DEVICE SUT OVERSTITCH 1 HND OP_FULL THK ENDOSCP FLXB CNCH (INSTRUMENTS ENDOMECHANICAL)
DEVICE SUT OVERSTITCH 1 HND OP_UNQ CNCH FLXB ENDOSCP RLD (INSTRUMENTS ENDOMECHANICAL)
DEVICE SUT OVERSTITCH NEEDLE DRIVER ANCH EXCH STRL DISP (ENDOSCOPIC SUPPLIES) IMPLANT
DILATOR ENDOS CRE 180CM 8CM 10-11-12MM 6FR ESOPH BAL LOW PROF FIX WRE PEBAX STRL LF  DISP 2.8MM (ENDOSCOPIC SUPPLIES) IMPLANT
DILATOR ENDOS CRE 180CM 8CM 10_-11-12MM 6FR ESOPH BAL LOW (INSTRUMENTS ENDOMECHANICAL)
DILATOR ENDOS CRE 180CM 8CM 15-16.5-18MM 6FR ESPH PYL FIX (INSTRUMENTS ENDOMECHANICAL)
DILATOR ENDOS CRE 180CM 8CM 15-16.5-18MM 6FR ESPH PYL FIX WRE BAL PEBAX STRL DISP ACPT .035IN GW 2.8 (ENDOSCOPIC SUPPLIES) IMPLANT
DILATOR ENDOS CRE 180CM 8CM 6FR 12-13.5-15MM ESOPH FIX WRE BAL RND SHLDR PEBAX STRL LF  DISP (ENDOSCOPIC SUPPLIES) IMPLANT
DILATOR ENDOS CRE 180CM 8CM 6F_R 12-13.5-15MM ESOPH FIX WRE (INSTRUMENTS ENDOMECHANICAL)
DILATOR ENDOS CRE 240CM 5.5CM 11-13.5-15MM 7.5FR ESOPH PYL BIL BAL LOW PROF GW PEBAX STRL LF  DISP (ENDOSCOPIC SUPPLIES) IMPLANT
DILATOR ENDOS CRE 240CM 5.5CM 15-16.5-18MM 7.5FR ESOPH PYL BIL BAL LOW PROF GW PEBAX STRL LF  DISP (ENDOSCOPIC SUPPLIES) IMPLANT
DILATOR ENDOS CRE 240CM 5.5CM 18-19-20MM 7.5FR ESOPH PYL BIL BAL LOW PROF GW PEBAX STRL LF  DISP 2.8 (ENDOSCOPIC SUPPLIES) IMPLANT
DILATOR ENDOS CRE 240CM 5.5CM 8-9-10MM 7.5FR ESOPH PYL BIL BAL LOW PROF GW PEBAX STRL LF  DISP 2.8MM (ENDOSCOPIC SUPPLIES) IMPLANT
DILATOR ENDOS CRE 240CM 5.5CM_11-13.5-15MM 7.5FR ESOPH PYL (INSTRUMENTS ENDOMECHANICAL)
DILATOR ENDOS CRE 240CM 5.5CM_15-16.5-18MM 7.5FR ESOPH PYL (INSTRUMENTS ENDOMECHANICAL)
DILATOR ENDOS CRE 240CM 5.5CM_18-19-20MM 7.5FR ESOPH PYL BIL (INSTRUMENTS ENDOMECHANICAL)
DILATOR ENDOS CRE 240CM 5.5CM_8-9-10MM 7.5FR ESOPH PYL BIL (INSTRUMENTS ENDOMECHANICAL)
DISCONTINUED USE 344090 - DEVICE SUT OVERSTICH SUT CNCH 6 UNIT STRL DISP (ENDOSCOPIC SUPPLIES) IMPLANT
DUPE USE ITEM 153319 - CLIP LGT RSL 360 ULTRA 235CM BRD ROT CONTROL KNOB 17MM OPN (ENDOSCOPIC SUPPLIES) IMPLANT
DUPE USE ITEM 162462 - ADAPTER CATH 11/32IN 1/8-3/8IN PLASTIC LL SYRG CONN POS GRIP RIDGE STRL LF  3/32IN TAPER (UROLOGICAL SUPPLIES) IMPLANT
DUPE USE ITEM 65945 - LIGATOR 2.8MM 8.6-11.5MM SSS7 STNG DPL MLD BAND ENDOS SQ LF (ENDOSCOPIC SUPPLIES) IMPLANT
ELECTRODE PATIENT RTN 9FT VLAB C30- LB RM PHSV ACRL FOAM CORD NONIRRITATE NONSENSITIZE ADH STRP (SURGICAL CUTTING SUPPLIES) IMPLANT
ELECTRODE PATIENT RTN 9FT VLAB_REM C30- LB PLHSV ACRL FOAM (CUTTING ELEMENTS)
FORCEPS BIOPSY HOT 240CM 2.2MM RJ 4 +2.8MM DISP (ENDOSCOPIC SUPPLIES)
FORCEPS BIOPSY HOT 240CM 2.2MM RJ 4 +2.8MM DISPO (ENDOSCOPIC SUPPLIES) IMPLANT
FORCEPS BIOPSY HOT 240CM 2.2MM_RJ 4 +2.8MM DISP (INSTRUMENTS ENDOMECHANICAL)
FORCEPS BIOPSY MICROMESH TTH STREAMLINE CATH 240CM 2.4MM RJ (ENDOSCOPIC SUPPLIES) ×1 IMPLANT
FORCEPS BIOPSY MICROMESH TTH STREAMLINE CATH NEEDLE 240CM (ENDOSCOPIC SUPPLIES) IMPLANT
FORCEPS BIOPSY NEEDLE 240CM 2.2MM RJ 4 2.8MM STD CPC STRL (ENDOSCOPIC SUPPLIES) IMPLANT
FORCEPS GRSP CMB RAT TOOTH ALG_TR JAW CATH 8MM 2.4MM RSC 230 (INSTRUMENTS ENDOMECHANICAL)
FORCEPS GRSP RAT TOOTH ALGTR JAW CATH 230CM 2.4MM RSC 7MM 2.8CM MN WKG CHNL (ENDOSCOPIC SUPPLIES) IMPLANT
GOWN PROTECT XL BLU FULL BCK HKLP NK KNITCUFF SMS 49X30IN 21IN (GLOVES AND ACCESSORIES) ×2 IMPLANT
GOWN PROTECT XL BLU FULL BCK H_KLP NK KNITCUFF SMS 49X30IN 21 (GLOVES AND ACCESSORIES) ×2
GW ENDOS 80CM CLEANGUIDE 4CM OTW RADOPQ DIL SPRG TIP (INSTRUMENTS ENDOMECHANICAL)
GW ENDOS 80CM CLEANGUIDE 4CM OTW RADOPQ DIL SPRG TIP CALIBRATE PLVN ESOPH STRL (ENDOSCOPIC SUPPLIES) IMPLANT
KIT 1.1+ DE BIOPSY BX20 (INSTRUMENTS ENDOMECHANICAL) ×1
KIT ENDOS CMPLN ENDOKIT ORCAPOD 4 2 END 1.1OZ (ENDOSCOPIC SUPPLIES) ×1 IMPLANT
LIGATOR 2.8MM 8.6-11.5MM SSS7 STNG DPL MLD BAND ENDOS SQ LF (ENDOSCOPIC SUPPLIES)
LIGATOR 2.8MM 8.6-11.5MM SSS7_STNG DPL MLD BAND ENDOS SQ LF (INSTRUMENTS ENDOMECHANICAL)
MANIFLD SUCT NPTN 2 STD 1 PORT WASTE MGMT SYS NONST LF  DISP (MED SURG SUPPLIES) ×1 IMPLANT
MANIFLD SUCT NPTN 2 STD 1 PORT_WASTE MGMT SYS NONST LF DISP (MED SURG SUPPLIES) ×1
NEEDLE SCLRTX 25GA 2.3MM BVL STRL DISP STAR CATH INTJCT 4MM 240CM (ENDOSCOPIC SUPPLIES) IMPLANT
NEEDLE SCLRTX 25GA 2.3MM BVL S_TRL DISP STAR CATH INTJCT 4MM (INSTRUMENTS ENDOMECHANICAL)
NET SPEC RETR 230CM 2.5MM RTHNT PLAT UNIV 4CM NONST LF  DISP (ENDOSCOPIC SUPPLIES) IMPLANT
NET SPEC RETR 230CM 2.5MM RTHN_T PLAT UNIV 5.5X4CM NONST LF (INSTRUMENTS ENDOMECHANICAL)
ORCA DISPOSABLE BUTTONS (INSTRUMENTS ENDOMECHANICAL)
PROBE ESURG 220CM 2.3MM FIAPC FLXB STR FIRE STRL DISP (SURGICAL CUTTING SUPPLIES) IMPLANT
PROBE ESURG 220CM 2.3MM FIAPC_FLXB STR FIRE ARGON PLAS COAG (CUTTING ELEMENTS)
SCISSOR ENDOS ENSIZOR 235CM 2.6MM (ENDOSCOPIC SUPPLIES) IMPLANT
SCISSOR ENDOS ENSIZOR 235CM 2._6MM (INSTRUMENTS ENDOMECHANICAL)
SNARE MICRO 240IN 13MM CAPTIVATR PLPCTM HEX ENDOS STRL DISP (ENDOSCOPIC SUPPLIES) IMPLANT
SNARE SM HEX 240CM 2.4MM CAPTI_VATR LOOP STF ENDOS PLPCTM 13 (INSTRUMENTS ENDOMECHANICAL)
SNARE SM OVAL 240CM 2.4MM SNS LOOP SHRTHRW FLXB ENDOS PLPCTM (INSTRUMENTS ENDOMECHANICAL)
SNARE SM OVAL 240CM 2.4MM SNS LOOP SHRTHRW FLXB ENDOS PLPCTM 13MM STRL DISP (ENDOSCOPIC SUPPLIES) IMPLANT
SYRINGE 60ML LF  STRL LL MED (MED SURG SUPPLIES)
SYRINGE 60ML LF  STRL PISTON ELONGATE TIP IRRG (MED SURG SUPPLIES) IMPLANT
SYRINGE 60ML LF STRL LL MED (MED SURG SUPPLIES)
SYRINGE 60ML LF STRL PISTON E_LONGATE TIP IRRG (MED SURG SUPPLIES)
TRAP MUCUS 40ML 5IN PLASTIC TRNSPT CAP CONTAINR DISP ADPR (MISCELLANEOUS PT CARE ITEMS)
TRAP MUCUS 40ML 5IN PLASTIC TRNSPT CAP CONTAINR DISP ADPR SUCT TUBE STRL LF (MISCELLANEOUS PT CARE ITEMS) IMPLANT
TRAP SPECI REM TRPS QD 4 REM CHAMBER SAF SCRN PARABOLA SLOT (ENDOSCOPIC SUPPLIES) IMPLANT
TRAP SPECI REM TRPS QD 4 REM C_HAMBER SAF SCRN PARABOLA SLOT (INSTRUMENTS ENDOMECHANICAL)
TUBING SUCT 10FT .25IN AMSURE NONST LF (MED SURG SUPPLIES) ×1
TUBING SUCT LIGHT BLU 10FT .25IN AMSURE FEMALE CONN BPA FREE COLLAPSE RST FLXB NONST LF  DISP (MED SURG SUPPLIES) ×1 IMPLANT
USE ITEM 77895 FORCEPS BIOPSY MICROMESH TTH STREAMLINE CATH NEEDLE 240CM (ENDOSCOPIC SUPPLIES) IMPLANT
USE ITEM 82891 SNARE SM OVAL 240CM 2.4MM SNS LOOP SHRTHRW FLXB ENDOS PLPCTM 13MM STRL DISP (ENDOSCOPIC SUPPLIES) IMPLANT
USE ITEM 82989 FORCEPS BIOPSY NEEDLE 240CM 2.2MM RJ 4 2.8MM STD CPC STRL (ENDOSCOPIC SUPPLIES) IMPLANT
VALVE SUCT ORCAPOD ORCA SEAL AIR WATER SIL FREE ENCLOSE SPRG BIOPSY OLMPS 160/180/190 SER AUX PORT (ENDOSCOPIC SUPPLIES) IMPLANT

## 2022-09-30 NOTE — H&P (Signed)
[facility]  Surgical History and Physical                                                     Hector [name], Hector [name], 61 y.o. male  Date of Admission:  09/30/2022  Date of Birth:  10-09-1961    09/30/2022  Chief Complaint: Procedure(s):  GASTROSCOPY     History obtained patient    HPI:  Hector [name] is a 61 y.o. male who presents for a gastroscopy with Dr. Merrie Roof. His last EGD was 6 months ago and was unremarkable, per patient. Per his medical record, he has a history of esophageal varices. He states he does not know if he has esophageal varices. He reports occasional dysphagia to solids and liquids. He states he has choked before but denies seeking medical attention for these choking episodes. He denies acid reflux, nausea, vomiting, hematemesis, melena, and abdominal pain. Patient denies any other current complaints.     Allergies   Allergen Reactions    Bee Venom Protein (Honey Bee)      Bee stings     Medications:  Current Outpatient Medications   Medication Instructions    ADVAIR HFA 115-21 mcg/actuation Inhalation oral inhaler INHALE 2 PUFFS BY MOUTH EVERY 12 HOURS    albuterol sulfate (PROAIR HFA) 90 mcg/actuation Inhalation oral inhaler 1-2 Puffs, Inhalation, EVERY 6 HOURS PRN    ipratropium-albuterol 0.5 mg-3 mg(2.5 mg base)/3 mL Solution for Nebulization NEBULIZE 1 VIAL 3 TIMES A DAY (8 HOURS APART) AS NEEDED FOR COUGH, SHORTNESS OF BREATH, WHEEZING    nystatin (NYSTOP) 100,000 unit/gram Powder Apply Topically, 2 TIMES DAILY    OLANZapine (ZYPREXA) 10 mg, Oral, NIGHTLY    pantoprazole (PROTONIX) 40 mg Oral Tablet, Delayed Release (E.C.) TAKE 1 TABLET (40 MG TOTAL) BY MOUTH TWICE A DAY 30 MINTUTES BEFORE MEALS FOR 90 DAYS    rosuvastatin (CRESTOR) 5 mg, Oral, EVERY EVENING    sertraline (ZOLOFT) 200 mg, Oral, DAILY    traZODone (DESYREL) 50 mg, Oral, NIGHTLY, Unsure dosage     Review of systems:  Review of Systems   Constitutional:  Negative for chills and fever.   Respiratory:  Positive for shortness of  breath and wheezing.         Patient reports chronic shortness of breath and wheezing. He reports he has a history of COPD and used his inhalers this morning. He denies any changes to these chronic symptoms. He states he constantly wears 4L of oxygen via nasal cannula.    Cardiovascular:  Positive for leg swelling. Negative for chest pain.        Patient reports chronic leg swelling. He denies any changes to these symptoms.    Gastrointestinal:         See HPI   Skin:  Negative for rash.      Past Medical History:   Diagnosis Date    Alcohol abuse     Chronic obstructive airway disease (CMS HCC)     Cirrhosis (CMS HCC)     Coma (CMS [facility])     after fall due to alcholism May 2022    Delirium, withdrawal, alcoholic (CMS HCC)     Depression     HTN (hypertension)     Unknown cause of injury     fx nose    Wears glasses  Past Surgical History:   Procedure Laterality Date    COLONOSCOPY  08/2021    ESOPHAGOGASTRODUODENOSCOPY  08/2021    HX TONSILLECTOMY           Social History     Socioeconomic History    Marital status: Divorced   Tobacco Use    Smoking status: Every Day     Packs/day: 1     Types: Cigarettes    Smokeless tobacco: Never   Vaping Use    Vaping Use: Never used   Substance and Sexual Activity    Alcohol use: Not Currently     Alcohol/week: 15.0 standard drinks of alcohol     Types: 15 Cans of beer per week     Comment:  may 17th, 2022     Drug use: Yes     Types: Marijuana     Comment:  a joint or 2 a day     Sexual activity: Yes     Family Medical History:       Problem Relation (Age of Onset)    Asthma Father    Cerebral Aneurysm Sister               Examination:  BP (!) 157/81   Pulse 65   Temp 36.5 C (97.7 F)   Resp (!) 24   Ht 1.727 m ('5\' 8"'$ )   Wt 113 kg (250 lb)   SpO2 97%   BMI 38.01 kg/m         Physical Exam  Vitals reviewed.   Constitutional:       Appearance: Normal appearance. He is obese.      Interventions: Nasal cannula in place.      Comments: 4L of oxygen via nasal  cannula.    HENT:      Head: Normocephalic.   Eyes:      Pupils: Pupils are equal, round, and reactive to light.   Cardiovascular:      Rate and Rhythm: Normal rate and regular rhythm.      Heart sounds: No murmur heard.  Pulmonary:      Effort: Pulmonary effort is normal.      Breath sounds: Normal breath sounds. No wheezing or rhonchi.      Comments: No tachypnea on exam. Respirations 18  Abdominal:      General: Bowel sounds are normal.      Palpations: Abdomen is soft.      Tenderness: There is no abdominal tenderness.   Musculoskeletal:      Right lower leg: Edema present.      Left lower leg: Edema present.   Skin:     General: Skin is warm and dry.   Neurological:      General: No focal deficit present.      Mental Status: He is alert and oriented to person, place, and time.   Psychiatric:         Mood and Affect: Mood normal.         Behavior: Behavior normal.          Assessment:   esophageal varices     Plan: Patient here for Procedure(s):  GASTROSCOPY   with Surgeon(s):  Thamas Jaegers, MD.      [place], [place]   09/30/2022, 10:24

## 2022-10-01 ENCOUNTER — Other Ambulatory Visit (INDEPENDENT_AMBULATORY_CARE_PROVIDER_SITE_OTHER): Payer: Self-pay | Admitting: Internal Medicine

## 2022-10-01 ENCOUNTER — Encounter (INDEPENDENT_AMBULATORY_CARE_PROVIDER_SITE_OTHER): Payer: Self-pay | Admitting: Internal Medicine

## 2022-10-01 ENCOUNTER — Telehealth (INDEPENDENT_AMBULATORY_CARE_PROVIDER_SITE_OTHER): Payer: Self-pay | Admitting: Internal Medicine

## 2022-10-01 LAB — SURGICAL PATHOLOGY SPECIMEN

## 2022-10-01 MED ORDER — NICOTINE 21 MG/24 HR DAILY TRANSDERMAL PATCH
21.0000 mg | MEDICATED_PATCH | Freq: Every day | TRANSDERMAL | 0 refills | Status: DC
Start: 2022-10-01 — End: 2023-01-21

## 2022-10-01 NOTE — Telephone Encounter (Signed)
Patch sent for 2 weeks call in 2 weeks

## 2022-10-01 NOTE — Telephone Encounter (Signed)
Message left for patient, RX sent to pharmacy and call office in 2 weeks. Sherlean Foot, LPN

## 2022-10-01 NOTE — Nursing Note (Signed)
Prior Auth initiated via covermymeds for Nicotine Patches. Sherlean Foot, LPN

## 2022-10-01 NOTE — Telephone Encounter (Signed)
Patient requesting RX for patches or gum to stop smoking.  Patient has already contacted his insurance and discussed. Sherlean Foot, LPN

## 2022-10-07 ENCOUNTER — Other Ambulatory Visit: Payer: Self-pay

## 2022-10-07 ENCOUNTER — Ambulatory Visit: Payer: MEDICAID | Attending: Medical | Admitting: Medical

## 2022-10-07 ENCOUNTER — Encounter (INDEPENDENT_AMBULATORY_CARE_PROVIDER_SITE_OTHER): Payer: Self-pay | Admitting: Medical

## 2022-10-07 VITALS — BP 125/56 | HR 74 | Resp 14 | Ht 68.0 in | Wt 259.0 lb

## 2022-10-07 DIAGNOSIS — K7030 Alcoholic cirrhosis of liver without ascites: Secondary | ICD-10-CM | POA: Insufficient documentation

## 2022-10-07 DIAGNOSIS — K2270 Barrett's esophagus without dysplasia: Secondary | ICD-10-CM | POA: Insufficient documentation

## 2022-10-07 NOTE — Progress Notes (Signed)
GASTROENTEROLOGY, [facility] [address]  [place] [address]  Operated by [facility]     Name: Hector [name] MRN:  [id number]   Date: 10/07/2022 Age: 61 y.o.     Chief Complaint: Cirrhosis (RPV for cirrhosis and hx Barrett's.  He had an EGD on 09/30/2022  He is on Protonix.)     Subjective:   Patient presents for follow up regarding cirrhosis/barretts. Patient is currently taking Protonix for Barretts     He is 12 pounds heavier than he was at last visit and his ankles have been swelling. His wife states he eats a lot of ice cream. He has not noticed abdominal distention. No nausea or vomiting. He is not taking diuretics. His dyspnea may be slightly worse than baseline but he is unsure.     Past Medical History  Current Outpatient Medications   Medication Sig    ADVAIR HFA 115-21 mcg/actuation Inhalation oral inhaler INHALE 2 PUFFS BY MOUTH EVERY 12 HOURS    albuterol sulfate (PROAIR HFA) 90 mcg/actuation Inhalation oral inhaler Take 1-2 Puffs by inhalation Every 6 hours as needed for Other (SHORTNESS OF BREATH,COUGH,WHEEZING)    ipratropium-albuterol 0.5 mg-3 mg(2.5 mg base)/3 mL Solution for Nebulization NEBULIZE 1 VIAL 3 TIMES A DAY (8 HOURS APART) AS NEEDED FOR COUGH, SHORTNESS OF BREATH, WHEEZING    nicotine (NICODERM CQ) 21 mg/24 hr Transdermal Patch 24 hr Place 1 Patch (21 mg total) on the skin Once a day for 14 days    nystatin (NYSTOP) 100,000 unit/gram Powder Apply topically Twice daily    OLANZapine (ZYPREXA) 10 mg Oral Tablet Take 1 Tablet (10 mg total) by mouth Every night    pantoprazole (PROTONIX) 40 mg Oral Tablet, Delayed Release (E.C.) TAKE 1 TABLET (40 MG TOTAL) BY MOUTH TWICE A DAY 30 MINTUTES BEFORE MEALS FOR 90 DAYS    rosuvastatin (CRESTOR) 5 mg Oral Tablet Take 1 Tablet (5 mg total) by mouth Every evening    sertraline (ZOLOFT) 100 mg Oral Tablet Take 2 Tablets (200 mg total) by mouth Once a day for 30 days    traZODone (DESYREL) 50 mg Oral Tablet Take 1 Tablet  (50 mg total) by mouth Every night Unsure dosage     Past Medical History:   Diagnosis Date    Alcohol abuse     Chronic obstructive airway disease (CMS HCC)     Cirrhosis (CMS HCC)     Coma (CMS HCC)     after fall due to alcholism May 2022    Delirium, withdrawal, alcoholic (CMS HCC)     Depression     HTN (hypertension)     Unknown cause of injury     fx nose    Wears glasses      Social History     Socioeconomic History    Marital status: Divorced   Tobacco Use    Smoking status: Every Day     Packs/day: 1     Types: Cigarettes    Smokeless tobacco: Never   Vaping Use    Vaping Use: Never used   Substance and Sexual Activity    Alcohol use: Not Currently     Alcohol/week: 15.0 standard drinks of alcohol     Types: 15 Cans of beer per week     Comment:  may 17th, 2022     Drug use: Yes     Types: Marijuana     Comment:  a joint or 2 a day  Sexual activity: Yes        ROS: see HPI    Objective:    BP (!) 125/56   Pulse 74   Resp 14   Ht 1.727 m ('5\' 8"'$ )   Wt 117 kg (259 lb)   BMI 39.38 kg/m     General: A&Ox3, no acute distress  HEENT: sclera anicteric  Heart: RRR, no murmurs  Lungs: clear, no wheezing or rhonchi  Abdomen:  soft, bowel sounds present x 4, non-tender, fluid wave not present  Extremities: 1+ lower extremity edema  Psych: no depression/anxiety    Labs: COMPLETE BLOOD COUNT   Lab Results   Component Value Date    WBC 3.9 (L) 03/11/2022    HGB 13.4 (L) 03/11/2022    HCT 39.3 03/11/2022    PLTCNT 68 (L) 03/11/2022     DIFFERENTIAL  Lab Results   Component Value Date    PMNS 54 03/11/2022    LYMPHOCYTES 31 03/11/2022    MONOCYTES 11 03/11/2022    EOSINOPHIL 4 03/11/2022    BASOPHILS 1 03/11/2022    BASOPHILS 0.00 03/11/2022    PMNABS 2.10 03/11/2022    LYMPHSABS 1.20 03/11/2022    EOSABS 0.10 03/11/2022    MONOSABS 0.40 03/11/2022     COMPREHENSIVE METABOLIC PANEL   Lab Results   Component Value Date    SODIUM 142 03/11/2022    POTASSIUM 4.5 03/11/2022    CHLORIDE 110 (H) 03/11/2022    CO2 25  03/11/2022    ANIONGAP 7 03/11/2022    BUN 11 03/11/2022    CREATININE 0.92 03/11/2022    GLUCOSENF 112 (H) 03/11/2022    CALCIUM 8.9 03/11/2022    PHOSPHORUS 4.2 05/24/2021    ALBUMIN 3.5 03/11/2022    TOTALPROTEIN 6.9 03/11/2022    ALKPHOS 102 03/11/2022    AST 48 09/17/2022    ALT 34 03/11/2022    BILIRUBINCON 0.0 05/13/2021     Component  Ref Range & Units 2 wk ago 1 yr ago   AFP TUMOR MARKER  <=9 ng/mL 5        PROTHROMBIN TIME and INR  Lab Results   Component Value Date    PROTHROMTME 11.6 09/06/2021    INR 1.13 (H) 09/06/2021       Radiology:   Recent Results (from the past [id number] hour(s))   US LIVER    Collection Time: 04/24/22 10:32 AM    Narrative    Coralyn Mark LEE Garguilo    RADIOLOGIST: Danne Baxter, MD    US LIVER performed on 04/24/2022 10:32 AM    CLINICAL HISTORY: K70.30: Alcoholic cirrhosis, unspecified whether ascites present (CMS [place]).  cirrhosis    COMPARISON:  10/22/2021    FINDINGS:  Liver: Cirrhotic morphology. No ultrasound evidence of mass. Coarsened echotexture.    Gallbladder:  There is some mild gallbladder wall thickening. There are no stones or any pericholecystic fluid. There is no reported sonographic Murphy sign. There is some mobile debris.    Common bile duct:  Normal measuring  3.5 mm.    Pancreas: Visualized portions are sonographically unremarkable.    Visualized portions of the right kidney are unremarkable.  No right upper quadrant ascites.        Impression    CIRRHOSIS WITHOUT ULTRASOUND EVIDENCE OF A MASS.        Radiologist location ID: [id number]     [place] RT UPPER QUADRANT    Collection Time: 10/22/21 10:24 AM    Narrative  Rithwik LEE Fluckiger    RADIOLOGIST: Jacob Sechrist    [place] RT UPPER QUADRANT performed on 10/22/2021 10:24 AM    CLINICAL HISTORY: K70.30: Alcoholic cirrhosis, unspecified whether ascites present (CMS [place]).  Cirrhosis    COMPARISON:  CT chest, abdomen, and pelvis dated 05/03/2021.    FINDINGS:  Liver: Enlarged, heterogeneous. Nodular liver  contour.    Gallbladder:  Cholelithiasis. No wall thickening or tenderness.    Common bile duct:  Normal measuring  2.23 mm.    Pancreas: Visualized portions are sonographically unremarkable.    Visualized portions of the right kidney are unremarkable.  No right upper quadrant ascites.        Impression    Cirrhotic liver morphology. No suspicious hepatic masses.    Cholelithiasis.        Radiologist location ID: [id number]       Pathology:  Recent Results (from the past 720 hour(s))   SURGICAL PATHOLOGY SPECIMEN   Result Value    Final Diagnosis      Lower Esophagus Biopsy:  Barretts esophagus.    No dysplasia is seen.        Microscopic Description      Microscopic features are typical for the above listed diagnosis.      Gross Description      A: Esophagus  The specimen is received in formalin labeled ' lower esophagus biopsy'.  It consists of 1 piece(s) of soft gray-tan irregular tissue measuring 0.2 cm in maximum dimension.  The specimen is entirely submitted in one cassette.           Problem List Items Addressed This Visit       Alcoholic cirrhosis (CMS HCC) - Primary    Relevant Orders    CBC    COMPREHENSIVE METABOLIC PANEL, NON-FASTING    PT/INR    ALPHA FETOPROTEIN (AFP) TUMOR MARKER    [place] RT UPPER QUADRANT        Plan:   The patient was seen independently by myself with Dr. Merrie Roof available for consultation.    cirrhosis  - etiology- alcohol  - MELD- 9 based on labs dated 09/06/21. will recheck labs today  - ascites- none appreciated. Low salt diet. Avoid NSAIDs  - encephalopathy- none present.  May start lactulose and titrate to 3-4 bowel movements daily if develops.  - PHTN- last EGD 09/30/22- grade 1 varices. Repeat 1-2 years. May need to add beta blocker if patient able to tolerate but will wait at this time (BP 125/56)  - HCC screening- last imaging 04/24/22. last AFP 09/06/21 normal. Repeat every 6 months. Will order  - immunizations- patient states he completed these through PCP    2. colon cancer  screening  - done 08/26/21- fair prep and multiple polyps.   - repeat due 2024    3. Barretts esophagus  - EGD 09/30/22- positive Barretts, no dysplasia  - continue PPI  - Tobacco cessation counseling performed     Return to Clinic: Return in about 6 months (around 04/08/2023).    Kelton Pillar, PA-C     DISCLAIMER: This note was partially created using voice recognition software which is subject to errors that may escape proof reading. Please pardon mistakes made by MModal. If typographical errors are noted and/or phrasing does not make sense, please contact me for corrections. I reserve the right to change note to correct mistakes related to MModal misinterpretation.

## 2022-10-14 ENCOUNTER — Other Ambulatory Visit (HOSPITAL_BASED_OUTPATIENT_CLINIC_OR_DEPARTMENT_OTHER): Payer: Self-pay

## 2022-10-14 ENCOUNTER — Encounter (INDEPENDENT_AMBULATORY_CARE_PROVIDER_SITE_OTHER): Payer: 59 | Admitting: Family Medicine

## 2022-10-14 DIAGNOSIS — J029 Acute pharyngitis, unspecified: Secondary | ICD-10-CM

## 2022-10-14 DIAGNOSIS — R0981 Nasal congestion: Secondary | ICD-10-CM | POA: Diagnosis not present

## 2022-10-14 MED ORDER — AMOXICILLIN 500 MG PO CAPS
1000.0000 mg | ORAL_CAPSULE | Freq: Two times a day (BID) | ORAL | 0 refills | Status: DC
  Filled 2022-10-14: qty 40, 10d supply, fill #0

## 2022-10-14 NOTE — Telephone Encounter (Signed)
Please see the MyChart message reply(ies) for my assessment and plan.  The patient gave consent for this Medical Advice Message and is aware that it may result in a bill to their insurance company as well as the possibility that this may result in a co-payment or deductible. They are an established patient, but are not seeking medical advice exclusively about a problem treated during an in person or video visit in the last 7 days. I did not recommend an in person or video visit within 7 days of my reply.  I spent a total of7 minutes cumulative time within 7 days through [place] messaging Lamar Blinks, MD

## 2022-10-15 ENCOUNTER — Inpatient Hospital Stay
Admission: RE | Admit: 2022-10-15 | Discharge: 2022-10-15 | Disposition: A | Discharge status: Home / Self Care | Payer: MEDICAID | Source: Ambulatory Visit | Attending: Medical | Admitting: Medical

## 2022-10-15 ENCOUNTER — Other Ambulatory Visit: Payer: Self-pay

## 2022-10-15 ENCOUNTER — Other Ambulatory Visit (HOSPITAL_COMMUNITY): Payer: MEDICAID

## 2022-10-15 DIAGNOSIS — K7030 Alcoholic cirrhosis of liver without ascites: Secondary | ICD-10-CM

## 2022-10-15 LAB — COMPREHENSIVE METABOLIC PANEL, NON-FASTING
ALBUMIN/GLOBULIN RATIO: 1.1 — ABNORMAL LOW (ref 1.5–2.5)
ALBUMIN: 4 g/dL (ref 3.5–5.0)
ALKALINE PHOSPHATASE: 125 U/L (ref 38–126)
ALT (SGPT): 35 U/L (ref <50)
ANION GAP: 8 mmol/L (ref 5–19)
AST (SGOT): 52 U/L (ref 17–59)
BILIRUBIN TOTAL: 0.8 mg/dL (ref 0.2–1.3)
BUN/CREA RATIO: 15 (ref 6–20)
BUN: 15 mg/dL (ref 9–20)
CALCIUM: 9.1 mg/dL (ref 8.4–10.2)
CHLORIDE: 112 mmol/L — ABNORMAL HIGH (ref 98–107)
CO2 TOTAL: 23 mmol/L (ref 22–30)
CREATININE: 1.01 mg/dL (ref 0.66–1.20)
ESTIMATED GFR: >60 mL/min/{1.73_m2} (ref >60)
GLUCOSE: 108 mg/dL — ABNORMAL HIGH (ref 74–106)
POTASSIUM: 4.5 mmol/L (ref 3.5–5.1)
PROTEIN TOTAL: 7.7 g/dL (ref 6.3–8.2)
SODIUM: 143 mmol/L (ref 137–145)

## 2022-10-15 LAB — ALPHA FETOPROTEIN (AFP) TUMOR MARKER: AFP TUMOR MARKER: 6 ng/mL (ref <=9)

## 2022-10-15 LAB — PT/INR
INR: 1.16 — ABNORMAL HIGH (ref 0.87–1.10)
PROTHROMBIN TIME: 11.8 seconds (ref 9.0–12.0)

## 2022-10-15 LAB — CBC
HCT: 40.8 % (ref 36.0–46.0)
HGB: 13.8 g/dL — ABNORMAL LOW (ref 13.9–16.3)
MCH: 31.9 pg (ref 25.4–34.0)
MCHC: 33.9 g/dL (ref 30.0–37.0)
MCV: 94.2 fL (ref 80.0–100.0)
MPV: 8.9 fL (ref 7.5–11.5)
PLATELETS: 65 10*3/uL — ABNORMAL LOW (ref 130–400)
RBC: 4.33 10*6/uL (ref 4.30–5.90)
RDW: 15.7 % — ABNORMAL HIGH (ref 11.5–14.0)
WBC: 4.5 10*3/uL (ref 4.5–11.5)

## 2022-10-16 IMAGING — DX DG HIP (WITH OR WITHOUT PELVIS) 2-3V*R*
3 series · 3 of 3 positions shown · non-contrast
Comparison: CT scan of the abdomen and pelvis October 05, 2020

CLINICAL DATA: Pain in right hip.  No known injury.

EXAM:
DG HIP (WITH OR WITHOUT PELVIS) 2-3V RIGHT

[pelvis ap]
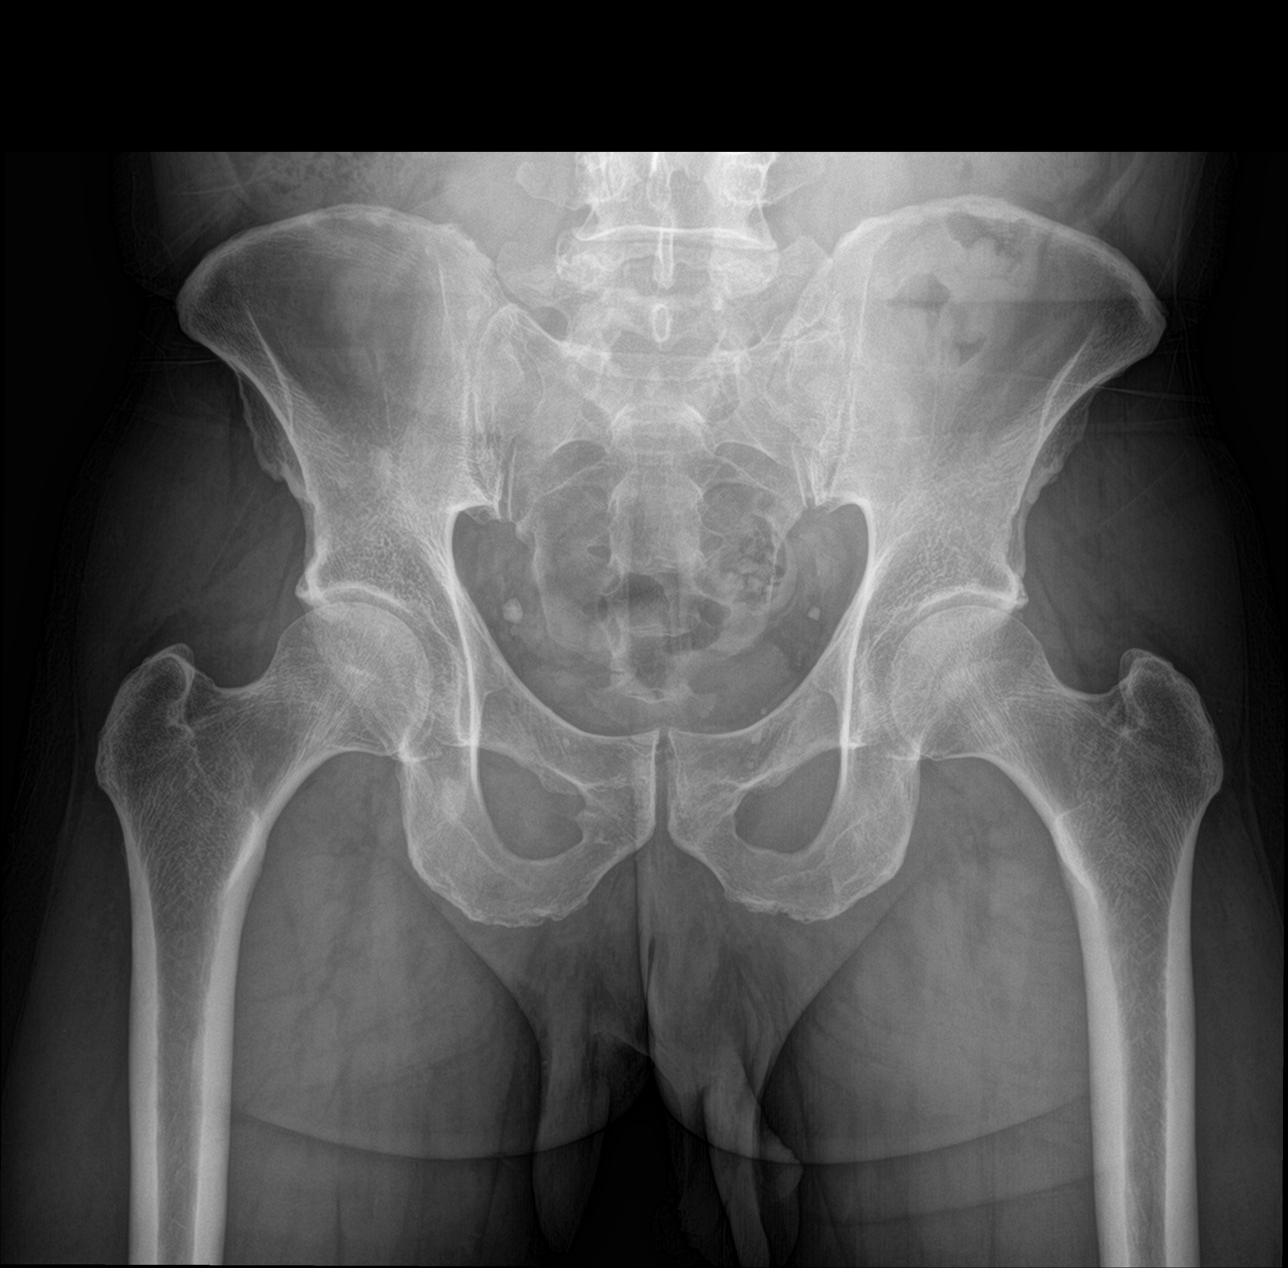

[hip ap]
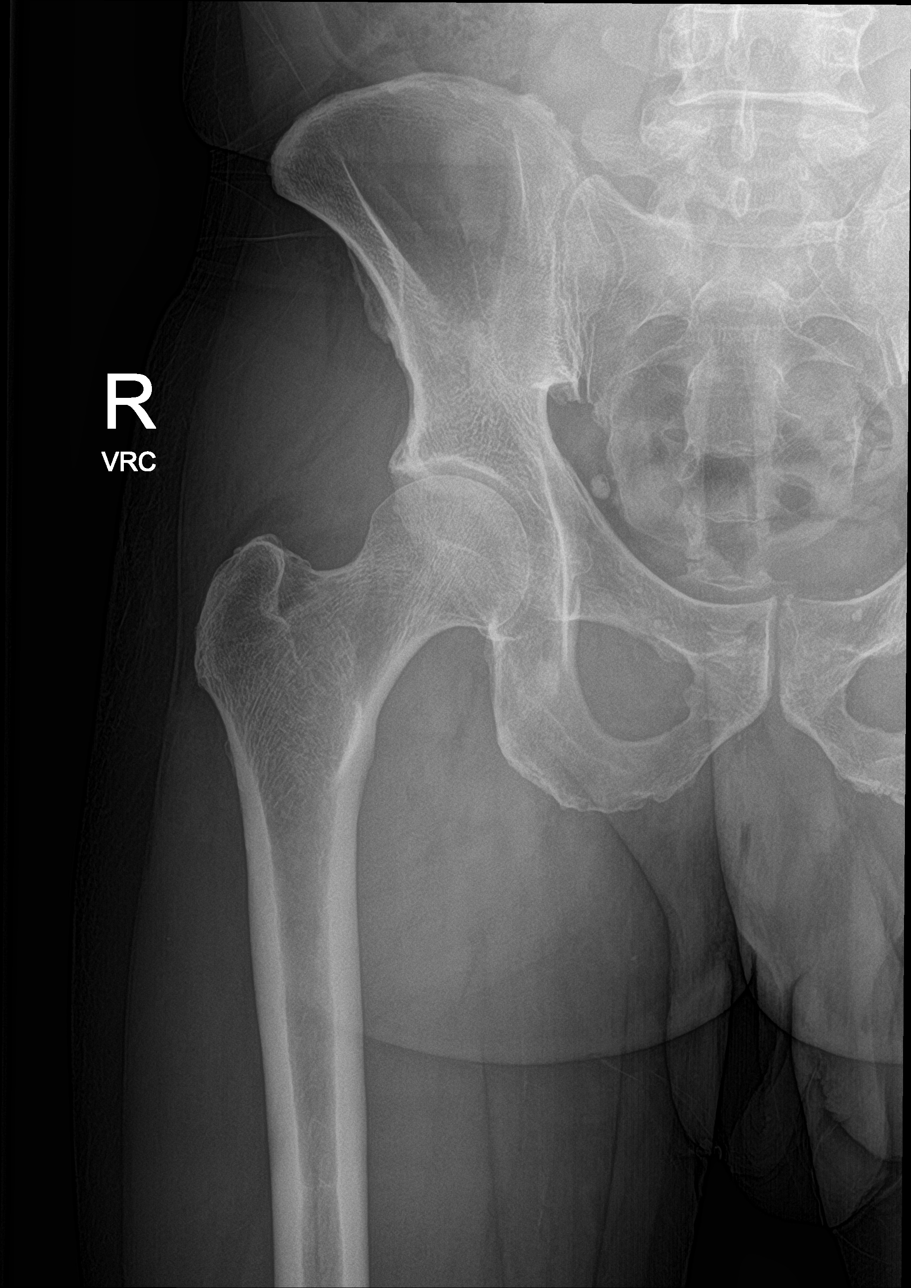

[hip lat]
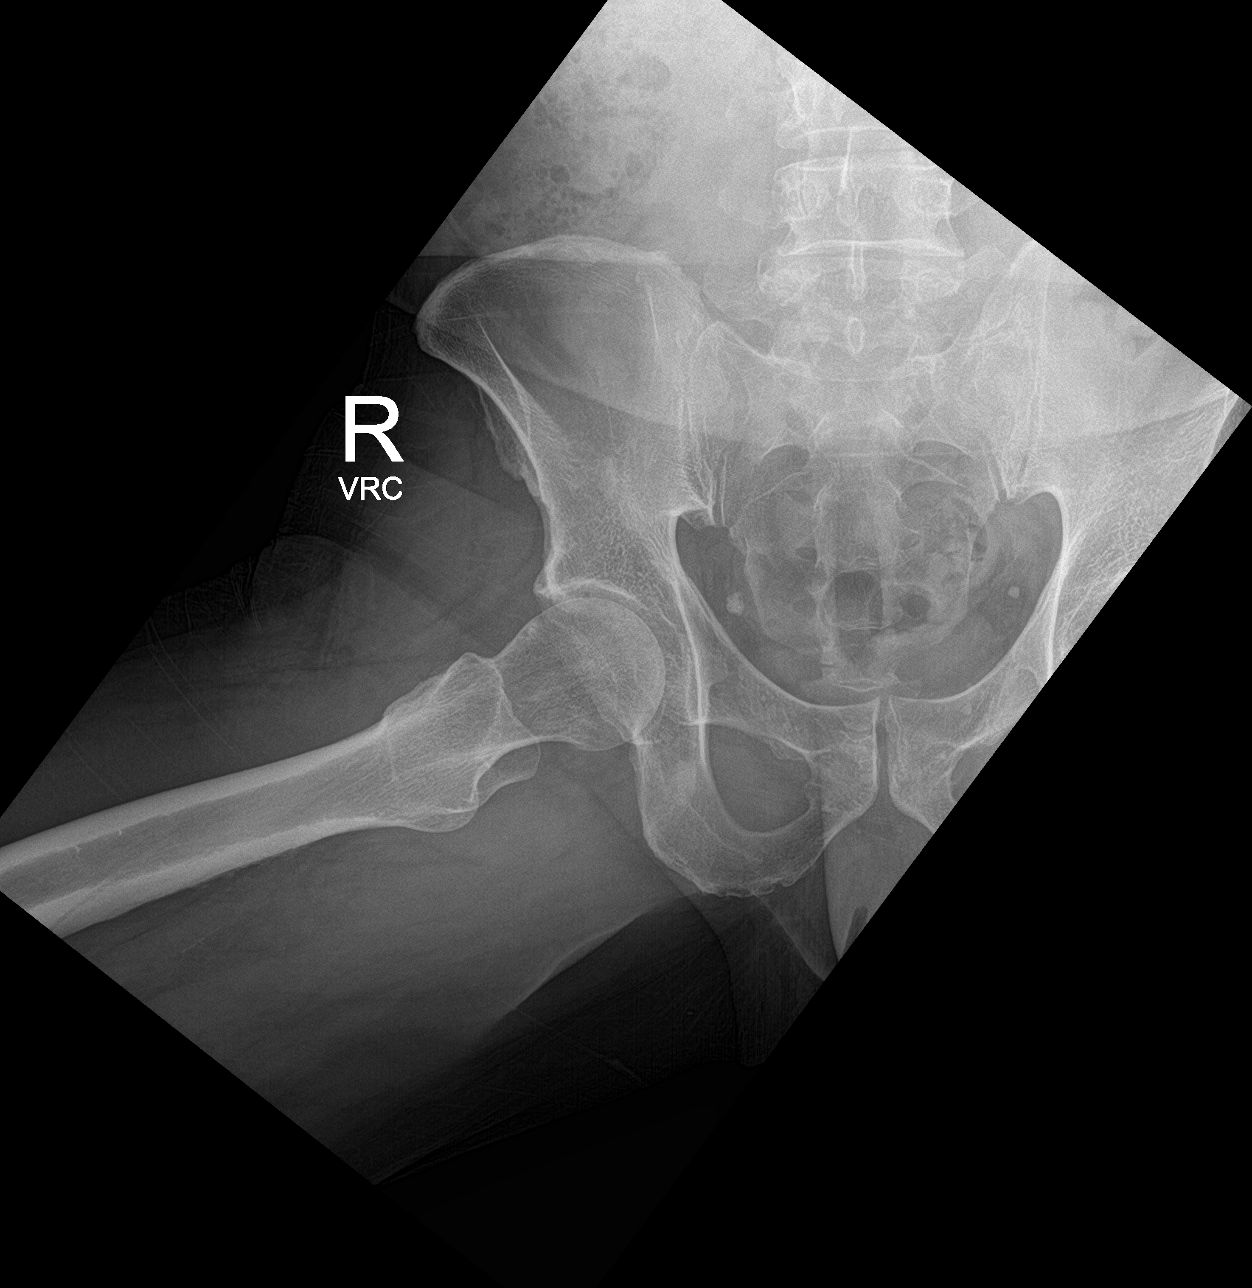

[3 of 3 positions shown; findings below may reference images not displayed]

FINDINGS: A bone island in the right inferior pubic ramus/ischial region is
identified. No other bony lesions are noted. No fractures or
dislocations. No significant degenerative changes. Transitional
anatomy at L5 with a left assimilation joint. No other significant
abnormalities.
IMPRESSION: 1. A stable bone island in the right inferior pubic ramus/ischial
region.
2. Transitional anatomy at L5 with a left assimilation joint.
3. No other abnormalities.

## 2022-10-21 ENCOUNTER — Encounter (INDEPENDENT_AMBULATORY_CARE_PROVIDER_SITE_OTHER): Payer: Self-pay | Admitting: Nurse Practitioner

## 2022-10-21 ENCOUNTER — Ambulatory Visit (INDEPENDENT_AMBULATORY_CARE_PROVIDER_SITE_OTHER): Payer: MEDICAID | Admitting: Nurse Practitioner

## 2022-10-21 ENCOUNTER — Other Ambulatory Visit: Payer: Self-pay

## 2022-10-21 VITALS — BP 130/84 | HR 68 | Temp 97.8°F | Resp 20 | Ht 68.0 in | Wt 251.3 lb

## 2022-10-21 DIAGNOSIS — Z716 Tobacco abuse counseling: Secondary | ICD-10-CM

## 2022-10-21 DIAGNOSIS — J449 Chronic obstructive pulmonary disease, unspecified: Secondary | ICD-10-CM

## 2022-10-21 DIAGNOSIS — Z6838 Body mass index (BMI) 38.0-38.9, adult: Secondary | ICD-10-CM

## 2022-10-21 DIAGNOSIS — Z9981 Dependence on supplemental oxygen: Secondary | ICD-10-CM

## 2022-10-21 DIAGNOSIS — F17210 Nicotine dependence, cigarettes, uncomplicated: Secondary | ICD-10-CM

## 2022-10-21 MED ORDER — FLUTICASONE PROPIONATE 230 MCG-SALMETEROL 21 MCG/ACTUATION HFA INHALER
2.0000 | INHALATION_SPRAY | Freq: Two times a day (BID) | RESPIRATORY_TRACT | 5 refills | Status: DC
Start: 2022-10-21 — End: 2023-04-01

## 2022-10-21 NOTE — Progress Notes (Signed)
PULMONOLOGY, [place]  [place] [address]      Pulmonary Clinic Follow Up    Patient Name: Hector [name]  Date: 10/21/2022  Department:  PULMONOLOGY, [place]  MRN: [id number]  DOB: 07-04-1961    FOLLOW-UP FOR COPD,OXYGEN DEPENDENT  [employer]    History of Present Illness: The patient presents to the office today for a six month follow up visit for COPD.  The patient states that he started to use prescribed nicotine topical patchest (10/17/2022) and has decreased his one pack smoking for the past 49 years to 1/2 pack daily,use of current long-acting bronchodilator Advair HFA 115-21 mcg two puffs twice a day is only 50% effective for his Dyspnea,cough and wheezing,uses nebulizer treatment once a day,oxygen 4 liters per nasal cannula continuously with benefit.  Last Pulmonary Function Test (09/10/2021): very mild in proportionate reduction in both forced vital capacity in the FEV1, FEV1/FVC ratio normal no gross evidence of obstructive lung disease, no evidence of hyperinflation, no significant improvement with the FEV1 with a bronchodilator, no evidence of restrictive lung disease present, diffusion corrected for hemoglobin moderately reduced to 59% predicted, diffusion adjusted for alveolar volume is mildly to borderline moderately reduced at 62% predicted.  The patient has a past medical history of Hypertension, patient denies Myocardial infarction, No stroke, No autoimmune disease disorder.    Medications    Current Outpatient Medications:     albuterol sulfate (PROAIR HFA) 90 mcg/actuation Inhalation oral inhaler, Take 1-2 Puffs by inhalation Every 6 hours as needed for Other (SHORTNESS OF BREATH,COUGH,WHEEZING), Disp: 8.5 g, Rfl: 5    fluticasone propion-salmeteroL (ADVAIR HFA) 230-21 mcg/actuation Inhalation oral inhaler, Take 2 Puffs by inhalation Twice daily RINSE MOUTH AFTER INHALER USE, Disp: 1 g, Rfl: 5     ipratropium-albuterol 0.5 mg-3 mg(2.5 mg base)/3 mL Solution for Nebulization, NEBULIZE 1 VIAL 3 TIMES A DAY (8 HOURS APART) AS NEEDED FOR COUGH, SHORTNESS OF BREATH, WHEEZING, Disp: 270 mL, Rfl: 5    nicotine (NICODERM CQ) 21 mg/24 hr Transdermal Patch 24 hr, Place 1 Patch (21 mg total) on the skin Once a day for 14 days, Disp: 14 Patch, Rfl: 0    nystatin (NYSTOP) 100,000 unit/gram Powder, Apply topically Twice daily, Disp: 60 g, Rfl: 1    OLANZapine (ZYPREXA) 10 mg Oral Tablet, Take 1 Tablet (10 mg total) by mouth Every night, Disp: , Rfl:     oxygen (O2) Inhalation gas, Administer 4 L/min into affected nostril(s) continuous, Disp: , Rfl:     pantoprazole (PROTONIX) 40 mg Oral Tablet, Delayed Release (E.C.), TAKE 1 TABLET (40 MG TOTAL) BY MOUTH TWICE A DAY 30 MINTUTES BEFORE MEALS FOR 90 DAYS, Disp: 60 Tablet, Rfl: 3    rosuvastatin (CRESTOR) 5 mg Oral Tablet, Take 1 Tablet (5 mg total) by mouth Every evening, Disp: 90 Tablet, Rfl: 4    sertraline (ZOLOFT) 100 mg Oral Tablet, Take 2 Tablets (200 mg total) by mouth Once a day for 30 days, Disp: 30 Tablet, Rfl: 0    traZODone (DESYREL) 50 mg Oral Tablet, Take 1 Tablet (50 mg total) by mouth Every night Unsure dosage, Disp: , Rfl:   Allergies  Allergies   Allergen Reactions    Bee Venom Protein (Honey Bee)      Bee stings      Vitals  Vitals:    10/21/22 1252   BP: 130/84   Pulse: 68   Resp:  20   Temp: 36.6 C (97.8 F)   TempSrc: Temporal   SpO2: 95%   Weight: 114 kg (251 lb 4.8 oz)   Height: 1.727 m ('5\' 8"'$ )   BMI: 38.29      There are no exam notes on file for this visit.   Past Medical History  Past Medical History:   Diagnosis Date    Alcohol abuse     Chronic obstructive airway disease (CMS HCC)     Cirrhosis (CMS HCC)     Coma (CMS [facility])     after fall due to alcholism May 2022    Delirium, withdrawal, alcoholic (CMS HCC)     Depression     HTN (hypertension)     Unknown cause of injury     fx nose    Wears glasses          Past Surgical History:   Procedure  Laterality Date    COLONOSCOPY  08/2021    COLONOSCOPY WITH COLD SNARE POLYPECTOMY N/A 08/26/2021    Performed by Thamas Jaegers, MD at [place]  08/2021    GASTROSCOPY WITH BIOPSY N/A 09/30/2022    Performed by Thamas Jaegers, MD at [place] N/A 08/26/2021    Performed by Thamas Jaegers, MD at [place] History:       Problem Relation (Age of Onset)    Asthma Father    Cerebral Aneurysm Sister            Social History     Socioeconomic History    Marital status: Divorced   Tobacco Use    Smoking status: Every Day     Packs/day: 1.00     Years: 49.00     Additional pack years: 0.00     Total pack years: 49.00     Types: Cigarettes     Start date: 1974    Smokeless tobacco: Never    Tobacco comments:     THE PATIENT STARTED TO USE TOPICAL NICOTINE PATCHES FOR SMOKING CESSSATION (10/17/2022),CURRENTLY SMOKES 1/2 PACK CIGARETTES DAILY   Vaping Use    Vaping Use: Never used   Substance and Sexual Activity    Alcohol use: Not Currently     Alcohol/week: 15.0 standard drinks of alcohol     Types: 15 Cans of beer per week     Comment:  may 17th, 2022     Drug use: Yes     Types: Marijuana     Comment:  a joint or 2 a day     Sexual activity: Yes     Social Determinants of Health     Financial Resource Strain: Low Risk  (10/21/2022)    Financial Resource Strain     SDOH Financial: No   Transportation Needs: Low Risk  (10/21/2022)    Transportation Needs     SDOH Transportation: No   Social Connections: Medium Risk (10/21/2022)    Social Connections     SDOH Social Isolation: 3 to 5 times a week   Intimate Partner Violence: Low Risk  (10/21/2022)    Intimate Partner Violence     SDOH Domestic Violence: No   Housing Stability: Low Risk  (10/21/2022)    Housing Stability     SDOH Housing Situation: I have housing.     SDOH Housing Worry: No  Outpatient Medications:  Current Outpatient Medications   Medication Sig    albuterol  sulfate (PROAIR HFA) 90 mcg/actuation Inhalation oral inhaler Take 1-2 Puffs by inhalation Every 6 hours as needed for Other (SHORTNESS OF BREATH,COUGH,WHEEZING)    fluticasone propion-salmeteroL (ADVAIR HFA) 230-21 mcg/actuation Inhalation oral inhaler Take 2 Puffs by inhalation Twice daily RINSE MOUTH AFTER INHALER USE    ipratropium-albuterol 0.5 mg-3 mg(2.5 mg base)/3 mL Solution for Nebulization NEBULIZE 1 VIAL 3 TIMES A DAY (8 HOURS APART) AS NEEDED FOR COUGH, SHORTNESS OF BREATH, WHEEZING    nicotine (NICODERM CQ) 21 mg/24 hr Transdermal Patch 24 hr Place 1 Patch (21 mg total) on the skin Once a day for 14 days    nystatin (NYSTOP) 100,000 unit/gram Powder Apply topically Twice daily    OLANZapine (ZYPREXA) 10 mg Oral Tablet Take 1 Tablet (10 mg total) by mouth Every night    oxygen (O2) Inhalation gas Administer 4 L/min into affected nostril(s) continuous    pantoprazole (PROTONIX) 40 mg Oral Tablet, Delayed Release (E.C.) TAKE 1 TABLET (40 MG TOTAL) BY MOUTH TWICE A DAY 30 MINTUTES BEFORE MEALS FOR 90 DAYS    rosuvastatin (CRESTOR) 5 mg Oral Tablet Take 1 Tablet (5 mg total) by mouth Every evening    sertraline (ZOLOFT) 100 mg Oral Tablet Take 2 Tablets (200 mg total) by mouth Once a day for 30 days    traZODone (DESYREL) 50 mg Oral Tablet Take 1 Tablet (50 mg total) by mouth Every night Unsure dosage       Review of system:  General:  No fever, chills, fatigue, weight gain, weight loss, appetite change.  No cold sweats at night.  Cardiac:  No chest pain, pressure, palpitations, hands or ankle edema.  Respiratory:  Positive  shortness of breath, Positive cough, Positive wheezing.  No hemoptysis.  No snoring, gasping for air, nocturnal cough. No witnessed apnea.  Neurological:  No headache or dizziness.  HEENT:  No visual changes,Positive nasal congestion and post nasal drainage.  No neck stiffness, sore throat or swollen glands.  GI:  No dysphagia, nausea, vomiting, constipation, diarrhea.  No blood in  stool.  Urinary:  No blood in urine.  Skin:  No rash.    Imaging:     Recent Results (from the past 2160 hour(s))   [place] RT UPPER QUADRANT     Status: None    Narrative    Jhayden LEE Hursey    RADIOLOGIST: Dara Lords, MD    [place] RT UPPER QUADRANT performed on 10/15/2022 10:09 AM    CLINICAL HISTORY: K70.30: Alcoholic cirrhosis, unspecified whether ascites present (CMS [place]).  Cirrhosis.    COMPARISON:  None.    FINDINGS:  Liver: Nodular and course.    Gallbladder:  Multiple echogenic gallstones are identified.      Common bile duct:  Normal measuring  3.5 mm.    Pancreas: Obscured by bowel gas.    Visualized portions of the right kidney are unremarkable.  No right upper quadrant ascites.        Impression    THERE ARE FINDINGS CONSISTENT WITH HISTORY OF CIRRHOSIS. NO FOCAL ADENOPATHY IS IDENTIFIED.    CHOLELITHIASIS.        Radiologist location ID: [id number]           PHYSICAL EXAMINATION:   Constitutional:  Patient alert and oriented, no acute distress, no comfortable appearing.Obese,BMI 38.29,patient has an strong odor or cigarette smoke,patient wearing oxygen per nasal cannula,ambulates independently,gait slow,steady,slight dyspnea with ambulation  General appearance of the patient   HEENT:  Head is normocephalic, atraumatic.  Tympanic membranes are easily visualized , transparent and pearly gray in color with normal light reflex noted bilaterally.  Nasal turbinates are pink and moist without inflammation or erythema. Nasal vestibule free of rhinorrhea.  Nasal vestibule shows evidence of nicotine stain mucosa  No tenderness upon palpation of sinus areas. Oral mucosa is pink and moist without lesions, no pharyngeal erythremia or exudate noted.  Trachea midline Neck supple without masses, no lymphadenopathy.  No jugular vein distention.  No thyromegaly.   Cardiovascular:   Apical 68 regular rate, rhythm, normal S1, S2 heard, no Murmurs, rubs, or gallops auscultated.  No carotid bruit.  No ankle  edema.  Lungs:  Clear to ascultation anterior and posterior, non labored respirations, no wheezes, rales or rhonchi, Thorax is symmetrical, chest walls expand equally, no pain upon palpation of upper anterior thorax, expirations not prolonged, no intercostal retractions.  Effort good, No Egophony, No dullness upon percussion.  Abdomen: Abdomen soft,obese non-distended, non-tender.  Musc: Normal gait and station, no digital cyanosis or clubbing.  Skin:  Color flesh tone.  Skin warm and dry.  No ecchymosis seen, nail beds pink, no clubbing, capillary refill less than four seconds, no rash, lesion or ulcers.   Psych:  Normal affect, interaction, and speech, cooperative, informative, pleasant.    Assessment:  (J44.9) Chronic obstructive pulmonary disease, unspecified COPD type (CMS HCC)  (primary encounter diagnosis)  Plan: fluticasone propion-salmeteroL (ADVAIR HFA)         230-21 mcg/actuation Inhalation oral inhaler    (F17.210) Cigarette smoker  Plan: CT LUNG SCREENING LDCT    (Z71.6) Tobacco abuse counseling    (Z99.81) Dependence on supplemental oxygen       Plan:    COPD uncontrolled,patient continues to smoke cigarettes daily,trying to use topical nicotine patches,patient encouraged to try to stop smoking and discussed risk of smoking with progression of Dyspnea  Will increase Advair HFA  dosage to 230-21 two puffs twice a day  Increase nebulizer treatments to twice a day  Albuterol rescue inhaler as needed  Continue oxygen 4 liters per nasal cannula continuously,patient benefits from oxygen therapy  Will order Low-Dose CT of CT for lung cancer screening,patient active cigarette smoking    The patient was given the opportunity to ask questions and those questions were answered to the patient's satisfaction. The patient was encouraged to call with any additional questions or concerns.   Discussed with patient effects and side effects of medications. Medication safety was discussed  Detailed time spent for  preparation of today's encounter,review of documents, interviewing/examining the patient,providing education/counselling,completing electronic orders and progress notes for the patient's plan of care.      Electronically signed by Lindwood Coke, CFNP  Pulmonary

## 2022-10-27 ENCOUNTER — Telehealth (INDEPENDENT_AMBULATORY_CARE_PROVIDER_SITE_OTHER): Payer: Self-pay | Admitting: Nurse Practitioner

## 2022-10-27 NOTE — Telephone Encounter (Addendum)
10-27-22 LDCT Lung Screening [facility]  [phone] Barb Merino. Male Case# [id number] start 10-27-22 exp 01-25-23  Auth# [id number]  LDCT Schedulers notified to schedule appt. They will review and schedule appt with patient directly.  Ramonita Lab Banks Np/Jm    11-12-22 Wednesday '@3'$ :00 pm  Patient notified by schedulers for LDCT.

## 2022-11-01 ENCOUNTER — Other Ambulatory Visit (INDEPENDENT_AMBULATORY_CARE_PROVIDER_SITE_OTHER): Payer: Self-pay | Admitting: Internal Medicine

## 2022-11-01 DIAGNOSIS — J449 Chronic obstructive pulmonary disease, unspecified: Secondary | ICD-10-CM

## 2022-11-11 ENCOUNTER — Encounter (INDEPENDENT_AMBULATORY_CARE_PROVIDER_SITE_OTHER): Payer: Self-pay | Admitting: Internal Medicine

## 2022-11-11 NOTE — Nursing Note (Signed)
See 11-11-2022 scan.  Patient was called and advised by voicemail to call the [phone] medicaid/health plan quit now line to start approval for nicoderm patches.

## 2022-11-12 ENCOUNTER — Inpatient Hospital Stay
Admission: RE | Admit: 2022-11-12 | Discharge: 2022-11-12 | Disposition: A | Discharge status: Home / Self Care | Payer: MEDICAID | Source: Ambulatory Visit | Attending: Nurse Practitioner | Admitting: Nurse Practitioner

## 2022-11-12 ENCOUNTER — Other Ambulatory Visit: Payer: Self-pay

## 2022-11-12 DIAGNOSIS — F17210 Nicotine dependence, cigarettes, uncomplicated: Secondary | ICD-10-CM | POA: Insufficient documentation

## 2022-11-12 NOTE — Result Encounter Note (Signed)
1. No suspicious lung nodules.   2. There is rounded atelectasis at the left lower lobe. The lungs are otherwise clear. Mild chronic emphysematous changes.  3. There is a small left pleural effusion. No right effusion. No pneumothorax.  4. Chronic hepatic cirrhotic changes of the liver. The spleen is mildly enlarged.  5. Coronary artery calcifications are present.     Lung RADS Assessment Category: 2- Benign     Will repeat lung screening in 1 year.  Encourage smoking cessation and continued adherence to annual lung cancer screenings to reduce risk of developing lung cancer.  Recommend following up with PCP and/or specialist for other incidental findings as an additional workup or referral may be considered.  PCP and pulmonary and GI notified.  Patient notified via mychart.    Thanks,     Reva Bores, MSN, APRN, FNP-C, [place]  Lung Cancer Screening & Tobacco Cessation  [phone]

## 2022-12-16 ENCOUNTER — Other Ambulatory Visit (INDEPENDENT_AMBULATORY_CARE_PROVIDER_SITE_OTHER): Payer: Self-pay | Admitting: Internal Medicine

## 2022-12-16 ENCOUNTER — Other Ambulatory Visit (INDEPENDENT_AMBULATORY_CARE_PROVIDER_SITE_OTHER): Payer: Self-pay | Admitting: PHYSICIAN ASSISTANT

## 2022-12-16 DIAGNOSIS — L304 Erythema intertrigo: Secondary | ICD-10-CM

## 2023-01-06 ENCOUNTER — Telehealth (INDEPENDENT_AMBULATORY_CARE_PROVIDER_SITE_OTHER): Payer: Self-pay | Admitting: Internal Medicine

## 2023-01-06 ENCOUNTER — Other Ambulatory Visit (INDEPENDENT_AMBULATORY_CARE_PROVIDER_SITE_OTHER): Payer: Self-pay | Admitting: Internal Medicine

## 2023-01-06 MED ORDER — NICOTINE 14 MG/24 HR DAILY TRANSDERMAL PATCH
14.0000 mg | MEDICATED_PATCH | Freq: Every day | TRANSDERMAL | 0 refills | Status: AC
Start: 2023-01-06 — End: 2023-01-20

## 2023-01-06 NOTE — Telephone Encounter (Signed)
Patient was given a script for Nicoderm patches on 10/01/2022 x 14 days.    He is now asking for another script.  [facility] [address]    Please advise.

## 2023-01-06 NOTE — Telephone Encounter (Signed)
Prescription sent.

## 2023-01-20 ENCOUNTER — Other Ambulatory Visit (INDEPENDENT_AMBULATORY_CARE_PROVIDER_SITE_OTHER): Payer: Self-pay | Admitting: Internal Medicine

## 2023-01-26 NOTE — Progress Notes (Unsigned)
[place] at [facility] [address], [place], [place] [address] [id number] [phone]  Date:  01/28/2023   Name:  Curtis Luna   DOB:  May 23, 1961   MRN:  [id number]  PCP:  Darreld Mclean, MD    Chief Complaint: No chief complaint on file.   History of Present Illness:  Curtis Luna is a 62 y.o. very pleasant male patient who presents with the following:  Pt seen today for a CPE- history of peripheral neuropathy, reflux, diverticulosis, CAD, ocular migraine headache, hyperlipidemia  Last seen by myself in April   CT chest done last April: CT CHEST WITHOUT CONTRAST IMPRESSION: 1. Two small adjacent 7 mm right lower lobe pulmonary nodules stable dating back to 06/26/2020. Repeat noncontrast chest CT could be considered in an additional 12 months to ensure continued stability, but these lesions are strongly favored to be benign at this time. This recommendation follows the consensus statement: Guidelines for Management of Incidental Pulmonary Nodules Detected on CT Images: From the Fleischner Society 2017; Radiology 2017; 284:228-243. 2. Aortic atherosclerosis, in addition to left main and three-vessel coronary artery disease. Please note that although the presence of coronary artery calcium documents the presence of coronary artery disease, the severity of this disease and any potential stenosis cannot be assessed on this non-gated CT examination. Assessment for potential risk factor modification, dietary therapy or pharmacologic therapy may be warranted, if clinically indicated.   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   Seasonal affective disorder ([place]) 10/02/2014   Internal hemorrhoid 04/20/2014   Chronic low back pain 04/20/2014   Anxiety 04/20/2014   Esophageal reflux 04/04/2011   Diverticulosis of colon (without mention of hemorrhage) 04/04/2011    Past Medical History:  Diagnosis Date   Abdominal pain, right lower quadrant    Acute bronchospasm    Allergy    Anxiety    on and off   Backache, unspecified    Chicken pox    Diverticulosis of colon (without mention of hemorrhage)    Esophageal reflux    External hemorrhoids without mention of complication    thrombosed   Hiatal hernia    Hyperlipidemia    no medicines    Inguinal hernia without mention of obstruction or gangrene, unilateral or unspecified, (not specified as recurrent)    Migraine    Obstructive sleep apnea (adult) (pediatric)    Other symptoms involving digestive system(787.99)    Pain in joint, forearm    Pain in joint, shoulder region    Seasonal allergies    Sleep apnea    no cpap but uses dental device    Thoracic or lumbosacral neuritis or radiculitis, unspecified     Past Surgical History:  Procedure Laterality Date   COLON RESECTION  10/06   sigmoid colon   COLONOSCOPY     INGUINAL HERNIA REPAIR  12/2012   Left   UPPER GASTROINTESTINAL ENDOSCOPY     WISDOM TOOTH EXTRACTION  Social History   Tobacco Use   Smoking status: Former    Types: Cigarettes, Cigars    Quit date: 10/04/1987    Years since quitting: 35.3   Smokeless tobacco: Never  Vaping Use   Vaping Use: Never used  Substance Use Topics   Alcohol use: Yes    Alcohol/week: 7.0 - 14.0 standard drinks of alcohol    Types: 7 - 14 Standard drinks or equivalent per week    Comment: 2 daily    Drug use: No    Family History  Problem Relation Age of Onset   Other Father        MVA   Lung cancer  Mother 23       Deceased   Diabetes Maternal Aunt    Heart disease Paternal Grandmother    Heart attack Paternal Grandmother    Healthy Brother        x2   Aneurysm Sister        Brain   Allergies Daughter        x2   Migraines Daughter        x1   Colon cancer Neg Hx    Colon polyps Neg Hx    Esophageal cancer Neg Hx    Rectal cancer Neg Hx    Stomach cancer Neg Hx    Pancreatic cancer Neg Hx    Allergic rhinitis Neg Hx    Angioedema Neg Hx    Asthma Neg Hx    Atopy Neg Hx    Eczema Neg Hx    Immunodeficiency Neg Hx    Urticaria Neg Hx     Allergies  Allergen Reactions   Escitalopram Hives    Pt felt anxious  Pt felt his skin was "on fire"   Protonix [Pantoprazole Sodium] Other (See Comments)    Tingling of extremities    Medication list has been reviewed and updated.  Current Outpatient Medications on File Prior to Visit  Medication Sig Dispense Refill   amoxicillin (AMOXIL) 500 MG capsule Take 2 capsules (1,000 mg total) by mouth 2 (two) times daily. 40 capsule 0   Ascorbic Acid (VITAMIN C) 1000 MG tablet Take 1,000 mg by mouth daily.     b complex vitamins tablet Take 1 tablet by mouth daily. Every other day     cholecalciferol (VITAMIN D3) 25 MCG (1000 UT) tablet Take 1,000 Units by mouth daily.     gabapentin (NEURONTIN) 100 MG capsule TAKE 1 CAPSULE BY MOUTH 3 TIMES  DAILY 270 capsule 3   LORazepam (ATIVAN) 1 MG tablet Take 1 tablet (1 mg total) by mouth as needed for anxiety. May take 1 per day 30 tablet 0   Multiple Vitamin (MULTIVITAMIN) tablet Take 1 tablet by mouth daily. Men's 50 plus multivitamin daily     raNITIdine HCl (ZANTAC PO) Take by mouth.     rosuvastatin (CRESTOR) 20 MG tablet Take 1 tablet (20 mg total) by mouth daily. 90 tablet 3   No current facility-administered medications on file prior to visit.    Review of Systems:  As per HPI- otherwise negative.   Physical Examination: There were no vitals filed for this visit. There were  no vitals filed for this visit. There is no height or weight on file to calculate BMI. Ideal Body Weight:    GEN: no acute distress. HEENT: Atraumatic, Normocephalic.  Ears and Nose: No external deformity. CV: RRR, No M/G/R. No JVD. No thrill. No extra heart sounds. PULM: CTA  B, no wheezes, crackles, rhonchi. No retractions. No resp. distress. No accessory muscle use. ABD: S, NT, ND, +BS. No rebound. No HSM. EXTR: No c/c/e PSYCH: Normally interactive. Conversant.    Assessment and Plan: *** Physical exam- recommend healthy diet and exercise routine Will plan further follow- up pending labs.  Signed Lamar Blinks, MD

## 2023-01-26 NOTE — Patient Instructions (Incomplete)
I will be in touch with your labs Recommend the shingles vaccine series at your pharmacy at your convenience   I will get your paperwork for your insurance taken care of as well once your labs come in Try the steroid cream once or twice a day as needed for the eczema behind your knees  You might try a probiotic for your digestion- I don't think your trouble with veggies is anything dangerous, but I don't have a great solution for it.  You might touch base with DR Henrene Pastor to see if he has any ideas   Assuming all is well we can plan to recheck in about 6 months

## 2023-01-28 ENCOUNTER — Ambulatory Visit (INDEPENDENT_AMBULATORY_CARE_PROVIDER_SITE_OTHER): Payer: 59 | Admitting: Family Medicine

## 2023-01-28 ENCOUNTER — Other Ambulatory Visit (HOSPITAL_BASED_OUTPATIENT_CLINIC_OR_DEPARTMENT_OTHER): Payer: Self-pay

## 2023-01-28 VITALS — BP 122/78 | HR 76 | Temp 98.0°F | Resp 18 | Ht 65.0 in | Wt 151.8 lb

## 2023-01-28 DIAGNOSIS — Z125 Encounter for screening for malignant neoplasm of prostate: Secondary | ICD-10-CM | POA: Diagnosis not present

## 2023-01-28 DIAGNOSIS — Z131 Encounter for screening for diabetes mellitus: Secondary | ICD-10-CM | POA: Diagnosis not present

## 2023-01-28 DIAGNOSIS — Z0000 Encounter for general adult medical examination without abnormal findings: Secondary | ICD-10-CM

## 2023-01-28 DIAGNOSIS — R6882 Decreased libido: Secondary | ICD-10-CM

## 2023-01-28 DIAGNOSIS — I2510 Atherosclerotic heart disease of native coronary artery without angina pectoris: Secondary | ICD-10-CM | POA: Diagnosis not present

## 2023-01-28 DIAGNOSIS — G629 Polyneuropathy, unspecified: Secondary | ICD-10-CM

## 2023-01-28 DIAGNOSIS — Z130 Encounter for screening for diseases of the blood and blood-forming organs and certain disorders involving the immune mechanism: Secondary | ICD-10-CM

## 2023-01-28 DIAGNOSIS — L2082 Flexural eczema: Secondary | ICD-10-CM

## 2023-01-28 MED ORDER — TRIAMCINOLONE ACETONIDE 0.1 % EX CREA
1.0000 | TOPICAL_CREAM | Freq: Two times a day (BID) | CUTANEOUS | 1 refills | Status: DC
  Filled 2023-01-28: qty 30, 15d supply, fill #0

## 2023-01-28 MED ORDER — GABAPENTIN 100 MG PO CAPS: 100.0000 mg | ORAL_CAPSULE | Freq: Three times a day (TID) | ORAL | 3 refills | Status: DC

## 2023-01-29 ENCOUNTER — Encounter: Payer: Self-pay | Admitting: Family Medicine

## 2023-01-29 ENCOUNTER — Other Ambulatory Visit (INDEPENDENT_AMBULATORY_CARE_PROVIDER_SITE_OTHER): Payer: 59

## 2023-01-29 DIAGNOSIS — R6882 Decreased libido: Secondary | ICD-10-CM

## 2023-01-29 DIAGNOSIS — Z131 Encounter for screening for diabetes mellitus: Secondary | ICD-10-CM

## 2023-01-29 DIAGNOSIS — Z130 Encounter for screening for diseases of the blood and blood-forming organs and certain disorders involving the immune mechanism: Secondary | ICD-10-CM | POA: Diagnosis not present

## 2023-01-29 DIAGNOSIS — Z125 Encounter for screening for malignant neoplasm of prostate: Secondary | ICD-10-CM

## 2023-01-29 DIAGNOSIS — I2510 Atherosclerotic heart disease of native coronary artery without angina pectoris: Secondary | ICD-10-CM

## 2023-01-29 LAB — CBC
HCT: 42.4 % (ref 39.0–52.0)
Hemoglobin: 14.4 g/dL (ref 13.0–17.0)
MCHC: 34.1 g/dL (ref 30.0–36.0)
MCV: 94.4 fl (ref 78.0–100.0)
Platelets: 262 10*3/uL (ref 150.0–400.0)
RBC: 4.49 Mil/uL (ref 4.22–5.81)
RDW: 14.6 % (ref 11.5–15.5)
WBC: 5.3 10*3/uL (ref 4.0–10.5)

## 2023-01-29 LAB — COMPREHENSIVE METABOLIC PANEL
ALT: 22 U/L (ref 0–53)
AST: 24 U/L (ref 0–37)
Albumin: 4.3 g/dL (ref 3.5–5.2)
Alkaline Phosphatase: 40 U/L (ref 39–117)
BUN: 15 mg/dL (ref 6–23)
CO2: 29 mEq/L (ref 19–32)
Calcium: 9.7 mg/dL (ref 8.4–10.5)
Chloride: 104 mEq/L (ref 96–112)
Creatinine, Ser: 0.99 mg/dL (ref 0.40–1.50)
GFR: 81.53 mL/min (ref >60.00)
Glucose, Bld: 91 mg/dL (ref 70–99)
Potassium: 4.4 mEq/L (ref 3.5–5.1)
Sodium: 140 mEq/L (ref 135–145)
Total Bilirubin: 0.4 mg/dL (ref 0.2–1.2)
Total Protein: 6.9 g/dL (ref 6.0–8.3)

## 2023-01-29 LAB — LIPID PANEL
Cholesterol: 134 mg/dL (ref 0–200)
HDL: 53.2 mg/dL (ref >39.00)
LDL Cholesterol: 65 mg/dL (ref 0–99)
NonHDL: 80.62
Total CHOL/HDL Ratio: 3
Triglycerides: 79 mg/dL (ref 0.0–149.0)
VLDL: 15.8 mg/dL (ref 0.0–40.0)

## 2023-01-29 LAB — PSA: PSA: 1.03 ng/mL (ref 0.10–4.00)

## 2023-01-29 LAB — HEMOGLOBIN A1C: Hgb A1c MFr Bld: 5.7 % (ref 4.6–6.5)

## 2023-01-29 NOTE — Progress Notes (Signed)
Received labs as below- message to pt  Results for orders placed or performed in visit on 01/29/23  PSA  Result Value Ref Range   PSA 1.03 0.10 - 4.00 ng/mL  Lipid panel  Result Value Ref Range   Cholesterol 134 0 - 200 mg/dL   Triglycerides 79.0 0.0 - 149.0 mg/dL   HDL 53.20 >39.00 mg/dL   VLDL 15.8 0.0 - 40.0 mg/dL   LDL Cholesterol 65 0 - 99 mg/dL   Total CHOL/HDL Ratio 3    NonHDL 80.62   Hemoglobin A1c  Result Value Ref Range   Hgb A1c MFr Bld 5.7 4.6 - 6.5 %  Comprehensive metabolic panel  Result Value Ref Range   Sodium 140 135 - 145 mEq/L   Potassium 4.4 3.5 - 5.1 mEq/L   Chloride 104 96 - 112 mEq/L   CO2 29 19 - 32 mEq/L   Glucose, Bld 91 70 - 99 mg/dL   BUN 15 6 - 23 mg/dL   Creatinine, Ser 0.99 0.40 - 1.50 mg/dL   Total Bilirubin 0.4 0.2 - 1.2 mg/dL   Alkaline Phosphatase 40 39 - 117 U/L   AST 24 0 - 37 U/L   ALT 22 0 - 53 U/L   Total Protein 6.9 6.0 - 8.3 g/dL   Albumin 4.3 3.5 - 5.2 g/dL   GFR 81.53 >60.00 mL/min   Calcium 9.7 8.4 - 10.5 mg/dL  CBC  Result Value Ref Range   WBC 5.3 4.0 - 10.5 K/uL   RBC 4.49 4.22 - 5.81 Mil/uL   Platelets 262.0 150.0 - 400.0 K/uL   Hemoglobin 14.4 13.0 - 17.0 g/dL   HCT 42.4 39.0 - 52.0 %   MCV 94.4 78.0 - 100.0 fl   MCHC 34.1 30.0 - 36.0 g/dL   RDW 14.6 11.5 - 15.5 %

## 2023-01-30 LAB — TESTOSTERONE TOTAL,FREE,BIO, MALES
Albumin: 4.3 g/dL (ref 3.6–5.1)
Sex Hormone Binding: 36 nmol/L (ref 22–77)
Testosterone, Bioavailable: 119.7 ng/dL (ref 110.0–575.0)
Testosterone, Free: 60.8 pg/mL (ref 46.0–224.0)
Testosterone: 481 ng/dL (ref 250–827)

## 2023-02-23 ENCOUNTER — Other Ambulatory Visit (INDEPENDENT_AMBULATORY_CARE_PROVIDER_SITE_OTHER): Payer: Self-pay | Admitting: Internal Medicine

## 2023-02-23 MED ORDER — NICOTINE 21 MG/24 HR DAILY TRANSDERMAL PATCH
21.0000 mg | MEDICATED_PATCH | Freq: Every day | TRANSDERMAL | 1 refills | Status: DC
Start: 2023-02-23 — End: 2023-03-31

## 2023-03-01 NOTE — Patient Instructions (Incomplete)
Good to see you again today!  Let's try prednisone for 8 days, and I will get some labs for you to rule out a B12, folate, iron deficiency If you don't improve with the steroids we will plan to get an MRI -please keep me posted

## 2023-03-01 NOTE — Progress Notes (Unsigned)
[place] at [facility] [address] [place], [place] [address] [phone]  Date:  03/02/2023   Name:  Curtis Luna   DOB:  1961/09/01   MRN:  [id number]  PCP:  Darreld Mclean, MD    Chief Complaint: Peripheral neuropathy possibly due to allergy shots (Pt says she has been having Neuropathy like sxs x 10 days. He wonders if they are associated with the allergy shouts thot he has been getting. Last injection was on 02/23/23 at [facility].)   History of Present Illness:  Curtis Luna is a 62 y.o. very pleasant male patient who presents with the following:  Pt seen today with concern of peripheral neuropathy Most recent visit with myself was in February for his CPE - besides neuropathy history of CAD, ocular migraine, GERD, hyperlipidemia He is taking gabapentin 100 TID and has felt like it was helpful for him   Elevated coronary calcium- he is seeing cardiology  Labs done last month- all looked ok  Follow-up CT for pulmonary nodules is scheduled for next month   Pt notes the top of his right foot and his right shin seems tingling- not numb- for about 10 days.  Will come and go during the day.  No particular pattern or association with positioning.  No claudication symptoms Just the right leg mostly- he did have sx on the left as well for just one day He was having some lower back pain and did see ortho through [facility]- they did x-rays, etc  He took some mobic for 3 days- no better really Still on his gabapentin, no change in dosage  He is doing allergy shots - for almost 2 years now. He had to change practices recently as his last doctor closed his practice He changed over in late January and is getting different shots since he changed practices-he wonders if this could possibly related to his numbness  X-ray from [place]: [facility] (AP, LAT, FLEX/EX), 09/25/2022 10:40 AM INDICATION: pain \ M25.551 Bilateral hip  pain \ M25.552 Bilateral hip pain COMPARISON: None. IMPRESSION: 1.  No acute lumbar vertebral fracture. 2.  No dynamic spondylolisthesis. Trace retrolisthesis at L4-L5 does not change with positioning. 3.  Mild disc degenerative changes and facet arthropathy at L3-L4, L4-L5, and L5-S1, slightly more pronounced at L4-L5. Mild disc degenerative changes also noted in the lower levels of the partially imaged thoracic spine. 4.  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 Mild degenerative changes at this left lumbosacral pseudoarticulation. 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   Seasonal affective disorder ([place]) 10/02/2014   Internal hemorrhoid 04/20/2014   Chronic low back pain 04/20/2014   Anxiety 04/20/2014   Esophageal reflux 04/04/2011   Diverticulosis of colon (without mention of hemorrhage) 04/04/2011    Past Medical History:  Diagnosis Date   Abdominal pain, right lower quadrant    Acute bronchospasm    Allergy    Anxiety    on and off  Backache, unspecified    Chicken pox    Diverticulosis of colon (without mention of hemorrhage)    Esophageal reflux    External hemorrhoids without mention of complication    thrombosed   Hiatal hernia    Hyperlipidemia    no medicines    Inguinal hernia without mention of obstruction or gangrene, unilateral or unspecified, (not specified as recurrent)    Migraine    Obstructive sleep apnea (adult) (pediatric)    Other symptoms involving digestive system(787.99)     Pain in joint, forearm    Pain in joint, shoulder region    Seasonal allergies    Sleep apnea    no cpap but uses dental device    Thoracic or lumbosacral neuritis or radiculitis, unspecified     Past Surgical History:  Procedure Laterality Date   COLON RESECTION  10/06   sigmoid colon   COLONOSCOPY     INGUINAL HERNIA REPAIR  12/2012   Left   UPPER GASTROINTESTINAL ENDOSCOPY     WISDOM TOOTH EXTRACTION      Social History   Tobacco Use   Smoking status: Former    Types: Cigarettes, Cigars    Quit date: 10/04/1987    Years since quitting: 35.4   Smokeless tobacco: Never  Vaping Use   Vaping Use: Never used  Substance Use Topics   Alcohol use: Yes    Alcohol/week: 7.0 - 14.0 standard drinks of alcohol    Types: 7 - 14 Standard drinks or equivalent per week    Comment: 2 daily    Drug use: No    Family History  Problem Relation Age of Onset   Other Father        MVA   Lung cancer Mother 5       Deceased   Diabetes Maternal Aunt    Heart disease Paternal Grandmother    Heart attack Paternal Grandmother    Healthy Brother        x2   Aneurysm Sister        Brain   Allergies Daughter        x2   Migraines Daughter        x1   Colon cancer Neg Hx    Colon polyps Neg Hx    Esophageal cancer Neg Hx    Rectal cancer Neg Hx    Stomach cancer Neg Hx    Pancreatic cancer Neg Hx    Allergic rhinitis Neg Hx    Angioedema Neg Hx    Asthma Neg Hx    Atopy Neg Hx    Eczema Neg Hx    Immunodeficiency Neg Hx    Urticaria Neg Hx     Allergies  Allergen Reactions   Escitalopram Hives    Pt felt anxious  Pt felt his skin was "on fire"   Protonix [Pantoprazole Sodium] Other (See Comments)    Tingling of extremities    Medication list has been reviewed and updated.  Current Outpatient Medications on File Prior to Visit  Medication Sig Dispense Refill   Ascorbic Acid (VITAMIN C) 1000 MG tablet Take 1,000 mg by mouth daily.     b complex vitamins tablet Take  1 tablet by mouth daily. Every other day     cholecalciferol (VITAMIN D3) 25 MCG (1000 UT) tablet Take 1,000 Units by mouth daily.     EPINEPHrine 0.3 mg/0.3 mL IJ SOAJ injection Inject into the muscle.     gabapentin (NEURONTIN) 100 MG capsule  Take 1 capsule (100 mg total) by mouth 3 (three) times daily. 270 capsule 3   LORazepam (ATIVAN) 1 MG tablet Take 1 tablet (1 mg total) by mouth as needed for anxiety. May take 1 per day 30 tablet 0   Multiple Vitamin (MULTIVITAMIN) tablet Take 1 tablet by mouth daily. Men's 50 plus multivitamin daily     raNITIdine HCl (ZANTAC PO) Take by mouth.     rosuvastatin (CRESTOR) 20 MG tablet Take 1 tablet (20 mg total) by mouth daily. 90 tablet 3   triamcinolone cream (KENALOG) 0.1 % Apply 1 Application topically 2 (two) times daily. Uses as needed for eczema 30 g 1   No current facility-administered medications on file prior to visit.    Review of Systems:  As per HPI- otherwise negative.   Physical Examination: Vitals:   03/02/23 0816  BP: 138/70  Pulse: 74  Resp: 18  Temp: 97.7 F (36.5 C)  SpO2: 98%   Vitals:   03/02/23 0816  Weight: 150 lb 12.8 oz (68.4 kg)  Height: 5\' 5"  (1.651 m)   Body mass index is 25.09 kg/m. Ideal Body Weight: Weight in (lb) to have BMI = 25: 149.9  GEN: no acute distress. Normal weight, looks well  HEENT: Atraumatic, Normocephalic.  Ears and Nose: No external deformity. CV: RRR, No M/G/R. No JVD. No thrill. No extra heart sounds. PULM: CTA B, no wheezes, crackles, rhonchi. No retractions. No resp. distress. No accessory muscle use. EXTR: No c/c/e PSYCH: Normally interactive. Conversant.  Excellent pulses in both LE, normal hair growth, warm and well perfused  Pt notes intermittent numbness in the L5/S1 distribution on the right only Normal strength, sensation and pulses in all extr Normal monofilament testing B feet  Assessment and Plan: Numbness and tingling of right leg - Plan: Vitamin B12, Folate,  Ferritin, predniSONE (DELTASONE) 20 MG tablet  Patient seen today with concern of numbness and tingling mostly in the right leg, suspect this is due to nerve impingement though he does also have diagnosis of neuropathy.  Will obtain lab work as above, trial of prednisone.  Discussed how to use prednisone, he has used this previously without concern.  If labs are normal and he fails to improve with prednisone will likely proceed to MRI lumbar spine Signed Lamar Blinks, MD  Received labs as below, message to patient  Results for orders placed or performed in visit on 03/02/23  Vitamin B12  Result Value Ref Range   Vitamin B-12 444 211 - 911 pg/mL  Folate  Result Value Ref Range   Folate 20.1 >5.9 ng/mL  Ferritin  Result Value Ref Range   Ferritin 74.1 22.0 - 322.0 ng/mL

## 2023-03-02 ENCOUNTER — Other Ambulatory Visit (HOSPITAL_BASED_OUTPATIENT_CLINIC_OR_DEPARTMENT_OTHER): Payer: Self-pay

## 2023-03-02 ENCOUNTER — Encounter: Payer: Self-pay | Admitting: Family Medicine

## 2023-03-02 ENCOUNTER — Ambulatory Visit (INDEPENDENT_AMBULATORY_CARE_PROVIDER_SITE_OTHER): Payer: 59 | Admitting: Family Medicine

## 2023-03-02 VITALS — BP 138/70 | HR 74 | Temp 97.7°F | Resp 18 | Ht 65.0 in | Wt 150.8 lb

## 2023-03-02 DIAGNOSIS — R200 Anesthesia of skin: Secondary | ICD-10-CM | POA: Diagnosis not present

## 2023-03-02 DIAGNOSIS — R202 Paresthesia of skin: Secondary | ICD-10-CM

## 2023-03-02 LAB — FOLATE: Folate: 20.1 ng/mL (ref >5.9)

## 2023-03-02 LAB — FERRITIN: Ferritin: 74.1 ng/mL (ref 22.0–322.0)

## 2023-03-02 LAB — VITAMIN B12: Vitamin B-12: 444 pg/mL (ref 211–911)

## 2023-03-02 MED ORDER — PREDNISONE 20 MG PO TABS
ORAL_TABLET | ORAL | 0 refills | Status: DC
  Filled 2023-03-02: qty 12, 8d supply, fill #0

## 2023-03-13 ENCOUNTER — Encounter: Payer: Self-pay | Admitting: Family Medicine

## 2023-03-13 DIAGNOSIS — R200 Anesthesia of skin: Secondary | ICD-10-CM

## 2023-03-13 DIAGNOSIS — G8929 Other chronic pain: Secondary | ICD-10-CM

## 2023-03-16 ENCOUNTER — Other Ambulatory Visit: Payer: Self-pay

## 2023-03-16 ENCOUNTER — Telehealth: Payer: Self-pay

## 2023-03-16 ENCOUNTER — Emergency Department (HOSPITAL_BASED_OUTPATIENT_CLINIC_OR_DEPARTMENT_OTHER): Payer: 59

## 2023-03-16 ENCOUNTER — Emergency Department (HOSPITAL_BASED_OUTPATIENT_CLINIC_OR_DEPARTMENT_OTHER)
Admission: EM | Admit: 2023-03-16 | Discharge: 2023-03-16 | Disposition: A | Discharge status: Home / Self Care | Payer: 59 | Attending: Emergency Medicine | Admitting: Emergency Medicine

## 2023-03-16 DIAGNOSIS — R202 Paresthesia of skin: Secondary | ICD-10-CM | POA: Insufficient documentation

## 2023-03-16 DIAGNOSIS — R739 Hyperglycemia, unspecified: Secondary | ICD-10-CM | POA: Diagnosis not present

## 2023-03-16 DIAGNOSIS — R200 Anesthesia of skin: Secondary | ICD-10-CM | POA: Diagnosis not present

## 2023-03-16 LAB — CBC
HCT: 42.5 % (ref 39.0–52.0)
Hemoglobin: 14.3 g/dL (ref 13.0–17.0)
MCH: 31.8 pg (ref 26.0–34.0)
MCHC: 33.6 g/dL (ref 30.0–36.0)
MCV: 94.4 fL (ref 80.0–100.0)
Platelets: 228 10*3/uL (ref 150–400)
RBC: 4.5 MIL/uL (ref 4.22–5.81)
RDW: 14 % (ref 11.5–15.5)
WBC: 8 10*3/uL (ref 4.0–10.5)
nRBC: 0 % (ref 0.0–0.2)

## 2023-03-16 LAB — BASIC METABOLIC PANEL
Anion gap: 7 (ref 5–15)
BUN: 21 mg/dL (ref 8–23)
CO2: 28 mmol/L (ref 22–32)
Calcium: 8.9 mg/dL (ref 8.9–10.3)
Chloride: 104 mmol/L (ref 98–111)
Creatinine, Ser: 0.97 mg/dL (ref 0.61–1.24)
GFR, Estimated: >60 mL/min (ref >60)
Glucose, Bld: 110 mg/dL — ABNORMAL HIGH (ref 70–99)
Potassium: 4.3 mmol/L (ref 3.5–5.1)
Sodium: 139 mmol/L (ref 135–145)

## 2023-03-16 LAB — TROPONIN I (HIGH SENSITIVITY): Troponin I (High Sensitivity): 3 ng/L (ref <18)

## 2023-03-16 NOTE — Telephone Encounter (Signed)
Pt in ED.  

## 2023-03-16 NOTE — ED Triage Notes (Signed)
Pt also reports sharp headache intermittently past few days behind his eyes.

## 2023-03-16 NOTE — ED Provider Notes (Signed)
[place] [facility] Provider Note   CSN: [id number] Arrival date & time: 03/16/23  1607     History  Chief Complaint  Patient presents with   Numbness    Curtis Luna is a 62 y.o. male.  With a history of lumbar radiculopathy, peripheral neuropathy, anxiety, migraines who presents to the ED for evaluation of bilateral upper extremity numbness and tingling.  Symptoms began on Friday.  He had similar symptoms in his lower extremities and was started on a steroid pack by his primary care provider.  His last dose was Tuesday. Upper extremity symptoms were initially intermittent, however he woke up this morning and has had continued tingling to the tips of bilateral fingers with left worse than right.  He did golf for the first time yesterday and believes that symptoms may also be because he has been sleeping on his left side.  Tingling symptoms begin in the elbows with radiation down, however is only in the finger tips right now.  He does have a history of headaches and has been having daily headaches for a while now.  He denies dizziness, lightheadedness, vision changes, weakness, dropping items, chest pain, shortness of breath, shoulder pain.  He does have chronic neck pain with no recent changes.  He tried to schedule appointment with his primary care provider but was told to come to the ED for emergent evaluation of his symptoms.  HPI     Home Medications Prior to Admission medications   Medication Sig Start Date End Date Taking? Authorizing Provider  Ascorbic Acid (VITAMIN C) 1000 MG tablet Take 1,000 mg by mouth daily.    [provider]  b complex vitamins tablet Take 1 tablet by mouth daily. Every other day    [provider]  cholecalciferol (VITAMIN D3) 25 MCG (1000 UT) tablet Take 1,000 Units by mouth daily.    [provider]  EPINEPHrine 0.3 mg/0.3 mL IJ SOAJ injection Inject into the muscle. 12/18/22   [provider]  gabapentin (NEURONTIN) 100 MG capsule Take 1 capsule (100 mg total) by mouth 3 (three) times daily. 01/28/23   Copland, Gay Filler, MD  LORazepam (ATIVAN) 1 MG tablet Take 1 tablet (1 mg total) by mouth as needed for anxiety. May take 1 per day 02/18/21   Copland, Gay Filler, MD  Multiple Vitamin (MULTIVITAMIN) tablet Take 1 tablet by mouth daily. Men's 50 plus multivitamin daily    [provider]  predniSONE (DELTASONE) 20 MG tablet Take 2 tablets (40 mg total) by mouth daily for 4 days, then 1 tablet (20 mg total) daily for 4 days. 03/02/23   Copland, Gay Filler, MD  raNITIdine HCl (ZANTAC PO) Take by mouth.    [provider]  rosuvastatin (CRESTOR) 20 MG tablet Take 1 tablet (20 mg total) by mouth daily. 08/21/22   Elouise Munroe, MD  triamcinolone cream (KENALOG) 0.1 % Apply 1 Application topically 2 (two) times daily. Uses as needed for eczema 01/28/23   Copland, Gay Filler, MD      Allergies    Escitalopram and Protonix [pantoprazole sodium]    Review of Systems   Review of Systems  Neurological:  Positive for numbness.  All other systems reviewed and are negative.   Physical Exam Updated Vital Signs BP 121/70   Pulse 65   Temp 98.3 F (36.8 C) (Oral)   Resp 11   Ht 5\' 5"  (1.651 m)   Wt 68 kg  SpO2 99%   BMI 24.96 kg/m  Physical Exam Vitals and nursing note reviewed.  Constitutional:      General: He is not in acute distress.    Appearance: He is well-developed.  HENT:     Head: Normocephalic and atraumatic.  Eyes:     Conjunctiva/sclera: Conjunctivae normal.  Cardiovascular:     Rate and Rhythm: Normal rate and regular rhythm.     Heart sounds: No murmur heard. Pulmonary:     Effort: Pulmonary effort is normal. No respiratory distress.     Breath sounds: Normal breath sounds.  Abdominal:     Palpations: Abdomen is soft.     Tenderness: There is no abdominal tenderness.  Musculoskeletal:        General: No swelling.     Cervical  back: Normal range of motion and neck supple. No rigidity.  Skin:    General: Skin is warm and dry.     Capillary Refill: Capillary refill takes less than 2 seconds.  Neurological:     General: No focal deficit present.     Mental Status: He is alert and oriented to person, place, and time.     Comments: No facial asymmetry, slurred speech, unilateral or global weakness, pronator drift.  Normal finger-to-nose and heel-to-shin.  Sensation intact in all fingertips.  Psychiatric:        Mood and Affect: Mood normal.     ED Results / Procedures / Treatments   Labs (all labs ordered are listed, but only abnormal results are displayed) Labs Reviewed  BASIC METABOLIC PANEL - Abnormal; Notable for the following components:      Result Value   Glucose, Bld 110 (*)    All other components within normal limits  CBC  TROPONIN I (HIGH SENSITIVITY)  TROPONIN I (HIGH SENSITIVITY)    EKG EKG Interpretation  Date/Time:  Monday March 16 2023 16:13:59 EDT Ventricular Rate:  84 PR Interval:  134 QRS Duration: 82 QT Interval:  368 QTC Calculation: 435 R Axis:   79 Text Interpretation: Sinus rhythm Probable left atrial enlargement Confirmed by Cindee Lame [id number] on 03/16/2023 5:48:39 PM  Radiology CT Head Wo Contrast  Result Date: 03/16/2023 CLINICAL DATA:  Numbness and tingling in the right leg. EXAM: CT HEAD WITHOUT CONTRAST TECHNIQUE: Contiguous axial images were obtained from the base of the skull through the vertex without intravenous contrast. RADIATION DOSE REDUCTION: This exam was performed according to the departmental dose-optimization program which includes automated exposure control, adjustment of the mA and/or kV according to patient size and/or use of iterative reconstruction technique. COMPARISON:  None Available. FINDINGS: Brain: No evidence of acute infarction, hemorrhage, hydrocephalus, extra-axial collection or mass lesion/mass effect. Vascular: No hyperdense vessel or  unexpected calcification. Skull: Normal. Negative for fracture or focal lesion. Sinuses/Orbits: Normal globes and orbits.  Clear sinuses. Other: None. IMPRESSION: Normal unenhanced CT scan of the brain. Electronically Signed   By: Lajean Manes M.D.   On: 03/16/2023 17:01   DG Chest Port 1 View  Result Date: 03/16/2023 CLINICAL DATA:  Extremity weakness EXAM: PORTABLE CHEST 1 VIEW COMPARISON:  05/15/2020, chest CT 03/27/2022 FINDINGS: The heart size and mediastinal contours are within normal limits. Both lungs are clear. The visualized skeletal structures are unremarkable. IMPRESSION: No active disease. Electronically Signed   By: Donavan Foil M.D.   On: 03/16/2023 16:48    Procedures Procedures    Medications Ordered in ED Medications - No data to display  ED Course/ Medical Decision Making/  A&P                             Medical Decision Making Amount and/or Complexity of Data Reviewed Labs: ordered. Radiology: ordered.  This patient presents to the ED for concern of bilateral hand paresthesias, this involves an extensive number of treatment options, and is a complaint that carries with it a high risk of complications and morbidity.  The differential diagnosis includes cervical radiculopathy, stroke, ACS, ulnar or carpal tunnel syndrome  Co morbidities that complicate the patient evaluation   lumbar radiculopathy, peripheral neuropathy, anxiety, migraines  My initial workup includes ACS rule out  Additional history obtained from: Nursing notes from this visit. Previous records within EMR system primary care visit on 03/02/2023  I ordered, reviewed and interpreted labs which include: BMP, CBC, troponin. Hyperglycemia of 110, labs otherwise unremarkable.  I ordered imaging studies including chest x-ray, CT head I independently visualized and interpreted imaging which showed normal chest x-ray and CT head. I agree with the radiologist interpretation  Cardiac Monitoring:  The  patient was maintained on a cardiac monitor.  I personally viewed and interpreted the cardiac monitored which showed an underlying rhythm of: NSR  Afebrile, hemodynamically stable. 62 year old male presenting to the ED for evaluation of bilateral hand paresthesias. He has normal sensation to all fingertips with normal capillary refill and radial pulses. He does not have focal neurologic deficits. Symptoms may be due to overuse given recent golf outing. Low suspicion for acute stroke given lack of other neurologic complaints and normal exam. EKG, troponin and CXR reassuring, low suspicion for ACS as the cause of symptoms. He has a PCP appointment on Wednesday and was encouraged to keep this. He was given return precautions. Stable at discharge.  At this time there does not appear to be any evidence of an acute emergency medical condition and the patient appears stable for discharge with appropriate outpatient follow up. Diagnosis was discussed with patient who verbalizes understanding of care plan and is agreeable to discharge. I have discussed return precautions with patient who verbalizes understanding. Patient encouraged to follow-up with their PCP within 1 week. All questions answered.  Patient's case discussed with Dr. Mayra Neer who agrees with plan to discharge with follow-up.   Note: Portions of this report may have been transcribed using voice recognition software. Every effort was made to ensure accuracy; however, inadvertent computerized transcription errors may still be present.        Final Clinical Impression(s) / ED Diagnoses Final diagnoses:  Paresthesias    Rx / DC Orders ED Discharge Orders     None         Roylene Reason, Hershal Coria 03/16/23 1841    Audley Hose, MD 03/18/23 [phone]

## 2023-03-16 NOTE — Addendum Note (Signed)
Addended by: Lamar Blinks C on: 03/16/2023 10:03 AM   Modules accepted: Orders

## 2023-03-16 NOTE — Discharge Instructions (Signed)
You have been seen today for your complaint of tingling to both of your hands. Your lab work was reassuring and showed no abnormalities. Your imaging was reassuring and showed no abnormalities. Your discharge medications include gabapentin. You may increase your dose by 1/2 tablet until your appointment if desired. Follow up with:your PCP as scheduled Please seek immediate medical care if you develop any of the following symptoms: Feel muscle weakness. Develop new weakness in an arm or leg. Have trouble walking or moving. Have problems with speech, understanding, or vision. Feel confused. Cannot control your bladder or bowel movements. At this time there does not appear to be the presence of an emergent medical condition, however there is always the potential for conditions to change. Please read and follow the below instructions.  Do not take your medicine if  develop an itchy rash, swelling in your mouth or lips, or difficulty breathing; call 911 and seek immediate emergency medical attention if this occurs.  You may review your lab tests and imaging results in their entirety on your MyChart account.  Please discuss all results of fully with your primary care provider and other specialist at your follow-up visit.  Note: Portions of this text may have been transcribed using voice recognition software. Every effort was made to ensure accuracy; however, inadvertent computerized transcription errors may still be present.

## 2023-03-16 NOTE — Telephone Encounter (Signed)
Initial Comment Pt has numbness in fingers and toes and hands. Pt says its been like this for a little not all of a sudden. Translation No Nurse Assessment Nurse: Nestor Lewandowsky, RN, [place] Date/Time Eilene Ghazi Time): 03/16/2023 3:42:27 PM Confirm and document reason for call. If symptomatic, describe symptoms. ---Pt has numbness in fingers and toes and hands 3 weeks ago.Pt given prednisone by pcp for tingling in feet. Pt was taking prednisone and noticed tingling after stopping medication. Bilateral feet/toes and left hand and a little in right hand and goes up to elbow. No fever. Headache that comes and goes. Does the patient have any new or worsening symptoms? ---Yes Will a triage be completed? ---Yes Related visit to physician within the last 2 weeks? ---Yes Does the PT have any chronic conditions? (i.e. diabetes, asthma, this includes High risk factors for pregnancy, etc.) ---Yes List chronic conditions. ---Chronic back pain Is this a behavioral health or substance abuse call? ---No Guidelines Guideline Title Affirmed Question Affirmed Notes Nurse Date/Time Eilene Ghazi Time) Neurologic Deficit Headache (and neurologic deficit) Nestor Lewandowsky, RN, [place] 03/16/2023 3:48:29 PM Disp. Time Eilene Ghazi Time) Disposition Final User 03/16/2023 3:54:05 PM Go to ED Now Yes Nestor Lewandowsky, RN, [place] PLEASE NOTE: All timestamps contained within this report are represented as [place] Standard Time. CONFIDENTIALTY NOTICE: This fax transmission is intended only for the addressee. It contains information that is legally privileged, confidential or otherwise protected from use or disclosure. If you are not the intended recipient, you are strictly prohibited from reviewing, disclosing, copying using or disseminating any of this information or taking any action in reliance on or regarding this information. If you have received this fax in error, please notify us immediately by telephone so that we can arrange for its return  to [place]. Phone: [phone], Toll-Free: [phone], Fax: [phone] Page: 2 of 2 Call Id: [id number] Final Disposition 03/16/2023 3:54:05 PM Go to ED Now Yes Nestor Lewandowsky, RN, Arnetha Courser Disagree/Comply Comply Caller Understands Yes PreDisposition InappropriateToAsk Care Advice Given Per Guideline GO TO ED NOW: * You need to be seen in the Emergency Department. CARE ADVICE given per Neurologic Deficit (Adult) guideline. Referrals [facility] - ED

## 2023-03-16 NOTE — ED Triage Notes (Signed)
Pt reports numbness/tingling in right leg after prolonged sitting several weeks ago. Pt was put on short prescription of prednisone. Pt finished prescription on Tuesday and began having numbness/tingling coming back on Friday in left hand. Pt also reports mild bilateral upper extremity weakness. Symptoms have been intermittent since Friday, most recently starting at 0800. Pt denies any balance issues, no slurred speech, no cognitive impairment, Negative NIH.

## 2023-03-17 NOTE — Progress Notes (Unsigned)
[facility], [place], [place] [address] [phone]  Date:  03/18/2023   Name:  Curtis Luna   DOB:  10-Jun-1961   MRN:  [id number]  PCP:  Darreld Mclean, MD    Chief Complaint: Numbness (ER follow up- 03/16/23 no med changes/Pt says ER suggested MRI of the neck. /Concerns/ questions: /)   History of Present Illness:  Curtis Luna is a 62 y.o. very pleasant male patient who presents with the following:  Patient seen today with concern of upper extremity numbness Most recent visit with myself was in February for his CPE - besides neuropathy history of CAD, ocular migraine, GERD, hyperlipidemia He is taking gabapentin 100 TID and has felt like it was helpful for him   Elevated coronary calcium- he is seeing cardiology  I last saw him on 3/18 at which time he had concern of numbness and tingling in his right foot and leg.  Occasionally symptoms on the left side as well He recently had lumbar spine x-rays per [facility], obtained in October of last year.  These were reviewed.  Trial of prednisone He then contacted me on 3/29, noted that steroids helped for a while but then symptoms seemed to come back.  I have ordered a lumbar MRI but this is not arranged yet  Patient then presented to the ER on 4/1 as he noted bilateral upper extremity numbness and tingling- this started about 4 days after he finished the prednisone He notes tingling and numbness in his fingertips, more on the left hand He was not having the numbness prior to using the steroids  The steroid did relieve the tingling in his right foot and leg He will notice this mostly if he sits, if he stands up and walks it will go away  They did a CT of his head which was normal-symptoms thought due to overuse during recent round of golf, released to home  He is not sure how much gabapentin is helping him but he thinks some He is taking 100 am and 300 at  bedtime   MRI lumbar is in the works  Patient Active Problem List   Diagnosis Date Noted   Allergic reaction to vaccine 11/12/2020   Toe pain, right 08/23/2018   Pain of left heel 06/27/2018   Multiple thyroid nodules 11/05/2015   Peripheral neuropathy 04/18/2015   Vertigo 04/15/2015   FH: brain aneurysm 04/15/2015   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   Seasonal affective disorder 10/02/2014   Internal hemorrhoid 04/20/2014   Chronic low back pain 04/20/2014   Anxiety 04/20/2014   Esophageal reflux 04/04/2011   Diverticulosis of colon (without mention of hemorrhage) 04/04/2011    Past Medical History:  Diagnosis Date   Abdominal pain, right lower quadrant    Acute bronchospasm    Allergy    Anxiety    on and off   Backache, unspecified    Chicken pox    Diverticulosis of colon (without mention of hemorrhage)    Esophageal reflux    External hemorrhoids without mention of complication    thrombosed   Hiatal hernia    Hyperlipidemia    no medicines    Inguinal hernia without mention of obstruction or gangrene, unilateral or unspecified, (not specified as recurrent)    Migraine    Obstructive sleep apnea (adult) (pediatric)    Other symptoms involving digestive system(787.99)  Pain in joint, forearm    Pain in joint, shoulder region    Seasonal allergies    Sleep apnea    no cpap but uses dental device    Thoracic or lumbosacral neuritis or radiculitis, unspecified     Past Surgical History:  Procedure Laterality Date   COLON RESECTION  10/06   sigmoid colon   COLONOSCOPY     INGUINAL HERNIA REPAIR  12/2012   Left   UPPER GASTROINTESTINAL ENDOSCOPY     WISDOM TOOTH EXTRACTION      Social History   Tobacco Use   Smoking status: Former    Types: Cigarettes, Cigars    Quit date: 10/04/1987    Years since quitting: 35.4   Smokeless tobacco: Never  Vaping Use   Vaping Use: Never used  Substance Use Topics   Alcohol use: Yes     Alcohol/week: 7.0 - 14.0 standard drinks of alcohol    Types: 7 - 14 Standard drinks or equivalent per week    Comment: 2 daily    Drug use: No    Family History  Problem Relation Age of Onset   Other Father        MVA   Lung cancer Mother 13       Deceased   Diabetes Maternal Aunt    Heart disease Paternal Grandmother    Heart attack Paternal Grandmother    Healthy Brother        x2   Aneurysm Sister        Brain   Allergies Daughter        x2   Migraines Daughter        x1   Colon cancer Neg Hx    Colon polyps Neg Hx    Esophageal cancer Neg Hx    Rectal cancer Neg Hx    Stomach cancer Neg Hx    Pancreatic cancer Neg Hx    Allergic rhinitis Neg Hx    Angioedema Neg Hx    Asthma Neg Hx    Atopy Neg Hx    Eczema Neg Hx    Immunodeficiency Neg Hx    Urticaria Neg Hx     Allergies  Allergen Reactions   Escitalopram Hives    Pt felt anxious  Pt felt his skin was "on fire"   Protonix [Pantoprazole Sodium] Other (See Comments)    Tingling of extremities    Medication list has been reviewed and updated.  Current Outpatient Medications on File Prior to Visit  Medication Sig Dispense Refill   Ascorbic Acid (VITAMIN C) 1000 MG tablet Take 1,000 mg by mouth daily.     b complex vitamins tablet Take 1 tablet by mouth daily. Every other day     cholecalciferol (VITAMIN D3) 25 MCG (1000 UT) tablet Take 1,000 Units by mouth daily.     EPINEPHrine 0.3 mg/0.3 mL IJ SOAJ injection Inject into the muscle.     gabapentin (NEURONTIN) 100 MG capsule Take 1 capsule (100 mg total) by mouth 3 (three) times daily. 270 capsule 3   LORazepam (ATIVAN) 1 MG tablet Take 1 tablet (1 mg total) by mouth as needed for anxiety. May take 1 per day 30 tablet 0   Multiple Vitamin (MULTIVITAMIN) tablet Take 1 tablet by mouth daily. Men's 50 plus multivitamin daily     predniSONE (DELTASONE) 20 MG tablet Take 2 tablets (40 mg total) by mouth daily for 4 days, then 1 tablet (20 mg total) daily for 4  days. 12 tablet 0   raNITIdine HCl (ZANTAC PO) Take by mouth.     rosuvastatin (CRESTOR) 20 MG tablet Take 1 tablet (20 mg total) by mouth daily. 90 tablet 3   triamcinolone cream (KENALOG) 0.1 % Apply 1 Application topically 2 (two) times daily. Uses as needed for eczema 30 g 1   No current facility-administered medications on file prior to visit.    Review of Systems:  As per HPI- otherwise negative.   Physical Examination: Vitals:   03/18/23 1357  BP: 120/68  Pulse: 80  Resp: 18  Temp: 97.8 F (36.6 C)  SpO2: 97%   Vitals:   03/18/23 1357  Weight: 147 lb (66.7 kg)  Height: 5\' 5"  (1.651 m)   Body mass index is 24.46 kg/m. Ideal Body Weight: Weight in (lb) to have BMI = 25: 149.9  GEN: no acute distress.  Normal weight, looks well HEENT: Atraumatic, Normocephalic.  Bilateral TM wnl, oropharynx normal.  PEERL,EOMI.   Ears and Nose: No external deformity. CV: RRR, No M/G/R. No JVD. No thrill. No extra heart sounds. PULM: CTA B, no wheezes, crackles, rhonchi. No retractions. No resp. distress. No accessory muscle use. EXTR: No c/c/e PSYCH: Normally interactive. Conversant.  Normal cervical spine range of motion.  Normal neurologic exam including strength, sensation, DTR of all extremities, negative Romberg testing, negative tandem stance.  Normal facial movement and sensation  Assessment and Plan: Paresthesias - Plan: DG Cervical Spine Complete  Patient seen today with concern of paresthesias.  As above, he originally was having some symptoms in his right foot.  Outside x-rays were done and reviewed by myself, he has an MRI pending.  He took a course of steroids last week, he notes when he was finishing the steroids he began to have some numbness and tingling in his bilateral hands.  He was then seen at the ER for assessment on April 1.  At this time numbness and tingling in his hands is virtually resolved  We will obtain plain films of the cervical spine today to help [place]  decide if we should also pursue a cervical MRI  Signed Lamar Blinks, MD  Received x-rays as below, message to patient  DG Cervical Spine Complete  Result Date: 03/18/2023 CLINICAL DATA:  Provided history: Numbness in hands. Bilateral arm numbness for several years. EXAM: CERVICAL SPINE - COMPLETE 4+ VIEW COMPARISON:  Radiographs of the cervical spine 10/03/2020. FINDINGS: Straightening of the expected cervical lordosis. 3 mm grade 1 anterolisthesis at C7-T1. Vertebral body height is maintained. No radiographic evidence of a cervical spine fracture. Multilevel disc space narrowing and degenerative endplate irregularity. Disc space narrowing is greatest at C3-C4, C5-C6 and C7-T1 (moderate at these levels). Multilevel posterior osteophytes and uncovertebral hypertrophy. Facet arthrosis, greatest at C5-C6 and C7-T1. Multilevel bony neural foraminal narrowing, greatest bilaterally at C5-C6. Ventral osteophyte at C3-C4. IMPRESSION: 1. Cervical spondylosis, as described. Multilevel bony neural foraminal narrowing, greatest bilaterally at C5-C6. 2. Nonspecific straightening of the expected cervical lordosis. 3. 3 mm grade 1 anterolisthesis at C7-T1. Electronically Signed   By: Kellie Simmering D.O.   On: 03/18/2023 14:57   CT Head Wo Contrast  Result Date: 03/16/2023 CLINICAL DATA:  Numbness and tingling in the right leg. EXAM: CT HEAD WITHOUT CONTRAST TECHNIQUE: Contiguous axial images were obtained from the base of the skull through the vertex without intravenous contrast. RADIATION DOSE REDUCTION: This exam was performed according to the departmental dose-optimization program which includes automated exposure control, adjustment of the  mA and/or kV according to patient size and/or use of iterative reconstruction technique. COMPARISON:  None Available. FINDINGS: Brain: No evidence of acute infarction, hemorrhage, hydrocephalus, extra-axial collection or mass lesion/mass effect. Vascular: No hyperdense vessel or  unexpected calcification. Skull: Normal. Negative for fracture or focal lesion. Sinuses/Orbits: Normal globes and orbits.  Clear sinuses. Other: None. IMPRESSION: Normal unenhanced CT scan of the brain. Electronically Signed   By: Lajean Manes M.D.   On: 03/16/2023 17:01   DG Chest Port 1 View  Result Date: 03/16/2023 CLINICAL DATA:  Extremity weakness EXAM: PORTABLE CHEST 1 VIEW COMPARISON:  05/15/2020, chest CT 03/27/2022 FINDINGS: The heart size and mediastinal contours are within normal limits. Both lungs are clear. The visualized skeletal structures are unremarkable. IMPRESSION: No active disease. Electronically Signed   By: Donavan Foil M.D.   On: 03/16/2023 16:48

## 2023-03-18 ENCOUNTER — Encounter: Payer: Self-pay | Admitting: Family Medicine

## 2023-03-18 ENCOUNTER — Ambulatory Visit (INDEPENDENT_AMBULATORY_CARE_PROVIDER_SITE_OTHER): Payer: 59 | Admitting: Family Medicine

## 2023-03-18 ENCOUNTER — Ambulatory Visit (HOSPITAL_BASED_OUTPATIENT_CLINIC_OR_DEPARTMENT_OTHER)
Admission: RE | Admit: 2023-03-18 | Discharge: 2023-03-18 | Disposition: A | Discharge status: Home / Self Care | Payer: 59 | Source: Ambulatory Visit | Attending: Family Medicine | Admitting: Family Medicine

## 2023-03-18 VITALS — BP 120/68 | HR 80 | Temp 97.8°F | Resp 18 | Ht 65.0 in | Wt 147.0 lb

## 2023-03-18 DIAGNOSIS — R202 Paresthesia of skin: Secondary | ICD-10-CM | POA: Diagnosis present

## 2023-03-18 DIAGNOSIS — R200 Anesthesia of skin: Secondary | ICD-10-CM

## 2023-03-30 ENCOUNTER — Ambulatory Visit (HOSPITAL_BASED_OUTPATIENT_CLINIC_OR_DEPARTMENT_OTHER)
Admission: RE | Admit: 2023-03-30 | Discharge: 2023-03-30 | Disposition: A | Discharge status: Home / Self Care | Payer: 59 | Source: Ambulatory Visit | Attending: Family Medicine | Admitting: Family Medicine

## 2023-03-30 DIAGNOSIS — R911 Solitary pulmonary nodule: Secondary | ICD-10-CM | POA: Diagnosis present

## 2023-03-31 ENCOUNTER — Telehealth (INDEPENDENT_AMBULATORY_CARE_PROVIDER_SITE_OTHER): Payer: Self-pay | Admitting: Internal Medicine

## 2023-03-31 ENCOUNTER — Other Ambulatory Visit (INDEPENDENT_AMBULATORY_CARE_PROVIDER_SITE_OTHER): Payer: Self-pay | Admitting: Internal Medicine

## 2023-03-31 ENCOUNTER — Encounter (INDEPENDENT_AMBULATORY_CARE_PROVIDER_SITE_OTHER): Payer: Self-pay | Admitting: Internal Medicine

## 2023-03-31 ENCOUNTER — Other Ambulatory Visit: Payer: Self-pay

## 2023-03-31 ENCOUNTER — Ambulatory Visit: Payer: MEDICAID | Attending: Internal Medicine | Admitting: Internal Medicine

## 2023-03-31 VITALS — BP 118/64 | HR 99 | Temp 97.0°F | Resp 24 | Ht 68.0 in | Wt 254.0 lb

## 2023-03-31 DIAGNOSIS — R0989 Other specified symptoms and signs involving the circulatory and respiratory systems: Secondary | ICD-10-CM | POA: Insufficient documentation

## 2023-03-31 DIAGNOSIS — J449 Chronic obstructive pulmonary disease, unspecified: Secondary | ICD-10-CM | POA: Insufficient documentation

## 2023-03-31 DIAGNOSIS — E785 Hyperlipidemia, unspecified: Secondary | ICD-10-CM | POA: Insufficient documentation

## 2023-03-31 DIAGNOSIS — Z9981 Dependence on supplemental oxygen: Secondary | ICD-10-CM | POA: Insufficient documentation

## 2023-03-31 DIAGNOSIS — K7460 Unspecified cirrhosis of liver: Secondary | ICD-10-CM | POA: Insufficient documentation

## 2023-03-31 MED ORDER — NICOTINE 21 MG/24 HR DAILY TRANSDERMAL PATCH
21.0000 mg | MEDICATED_PATCH | Freq: Every day | TRANSDERMAL | 0 refills | Status: AC
Start: 2023-03-31 — End: 2023-04-30

## 2023-03-31 NOTE — Progress Notes (Signed)
INTERNAL MEDICINE, [facility]  [address]  [place] [address]  Operated by [facility]  Progress Note    Name: Samul Mcinroy MRN:  [id number]   Date: 03/31/2023 DOB:  January 09, 1961 (62 y.o.)           Chief Complaint: Follow Up 6 Months (Patient here for follow up visit, accompanied by girlfriend today, patient states he is SOB, states he had some testing done recently, patient states when he needs refill on nicotine patches and gum - was never filled when he called for refill, )    Subjective:   Patient today here for follow-up with his wife.      Still smoking add patches for few months did not help    Wife says he has been sleeping more than usual    No chest pain palpitation fever chills    No diarrhea or abdominal pain  Objective :  BP 118/64 (Site: Right, Patient Position: Sitting, Cuff Size: Adult Large)   Pulse 99   Temp 36.1 C (97 F) (Tympanic)   Resp (!) 24   Ht 1.727 m ( )   Wt 115 kg (254 lb)   SpO2 93%   BMI 38.62 kg/m       Patient alert oriented x3 does not appear to be in distress    Neck bilateral carotid bruits     Lungs are clear     Heart S1-S2 no murmur     Abdomen soft obese nontender no mass     Extremities trace edema Homans negative     Mood and affect appear normal  Data reviewed:    Current Outpatient Medications   Medication Sig    fluticasone propion-salmeteroL (ADVAIR HFA) 230-21 mcg/actuation Inhalation oral inhaler Take 2 Puffs by inhalation Twice daily RINSE MOUTH AFTER INHALER USE    ipratropium-albuterol 0.5 mg-3 mg(2.5 mg base)/3 mL Solution for Nebulization NEBULIZE 1 VIAL 3 TIMES A DAY (8 HOURS APART) AS NEEDED FOR COUGH, SHORTNESS OF BREATH, WHEEZING    nystatin (NYSTOP) 100,000 unit/gram Powder APPLY TO AFFECTED AREA TWICE A DAY (Patient not taking: Reported on 03/31/2023)    OLANZapine (ZYPREXA) 10 mg Oral Tablet Take 1 Tablet (10 mg total) by mouth Every night    oxygen (O2) Inhalation gas Administer 4 L/min into affected nostril(s) continuous     pantoprazole (PROTONIX) 40 mg Oral Tablet, Delayed Release (E.C.) TAKE 1 TABLET (40 MG TOTAL) BY MOUTH TWICE A DAY 30 MINTUTES BEFORE MEALS FOR 90 DAYS    rosuvastatin (CRESTOR) 5 mg Oral Tablet Take 1 Tablet (5 mg total) by mouth Every evening    sertraline (ZOLOFT) 100 mg Oral Tablet Take 2 Tablets (200 mg total) by mouth Once a day for 30 days    traZODone (DESYREL) 50 mg Oral Tablet Take 1 Tablet (50 mg total) by mouth Every night Unsure dosage    VENTOLIN HFA 90 mcg/actuation Inhalation oral inhaler TAKE 1-2 PUFFS BY INHALATION EVERY 6 HOURS AS NEEDED FOR SHORTNESS OF BREATH, COUGH, & WHEEZING     Assessment/Plan  Problem List Items Addressed This Visit          Respiratory    Chronic obstructive pulmonary disease (CMS HCC) (Chronic)       Digestive    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 Dyslipidemia patient LDL was within normal limits he is on Crestor 5 mg daily    3. COPD oxygen dependent Advair and DuoNeb nebulizer also sees a pulmonologist follow up closely     4.   Depression under the care of Behavioral Health    Discussed smoking cessation, advised on the importance of quitting and the impact smoking has on patient's health.   Assessed willingness to quit  Educated patient on methods and skills for cessation  Offered medication management  Provided information on [place] Quit Line 1-800-QUIT-NOW       Robin Searing, MD

## 2023-03-31 NOTE — Patient Instructions (Signed)
Take asa 81 mg daily ,check with GI if its ok    Obtain labs     Stop smoking

## 2023-03-31 NOTE — Telephone Encounter (Signed)
Patient needs to register with his insurance for them to pay for his patches.  Call out to inform patient. Rolley Sims, LPN

## 2023-03-31 NOTE — Telephone Encounter (Signed)
He can get prescription only for nicotine patches which is sent.

## 2023-03-31 NOTE — Telephone Encounter (Signed)
Patient states that he needs refills for the Nicotine patches and the Nicotine gum as discussed at the last office visit. He states he needs a month's supply at a time.   [facility] on [address]

## 2023-04-01 ENCOUNTER — Telehealth (HOSPITAL_COMMUNITY): Payer: Self-pay | Admitting: Internal Medicine

## 2023-04-01 ENCOUNTER — Other Ambulatory Visit (INDEPENDENT_AMBULATORY_CARE_PROVIDER_SITE_OTHER): Payer: Self-pay | Admitting: Nurse Practitioner

## 2023-04-01 ENCOUNTER — Other Ambulatory Visit (INDEPENDENT_AMBULATORY_CARE_PROVIDER_SITE_OTHER): Payer: Self-pay | Admitting: Internal Medicine

## 2023-04-01 DIAGNOSIS — J449 Chronic obstructive pulmonary disease, unspecified: Secondary | ICD-10-CM

## 2023-04-01 DIAGNOSIS — E785 Hyperlipidemia, unspecified: Secondary | ICD-10-CM

## 2023-04-02 ENCOUNTER — Encounter: Payer: Self-pay | Admitting: Family Medicine

## 2023-04-02 ENCOUNTER — Other Ambulatory Visit (INDEPENDENT_AMBULATORY_CARE_PROVIDER_SITE_OTHER): Payer: Self-pay | Admitting: Internal Medicine

## 2023-04-02 ENCOUNTER — Inpatient Hospital Stay
Admission: RE | Admit: 2023-04-02 | Discharge: 2023-04-02 | Disposition: A | Discharge status: Home / Self Care | Payer: MEDICAID | Source: Ambulatory Visit | Attending: Internal Medicine

## 2023-04-02 ENCOUNTER — Other Ambulatory Visit: Payer: Self-pay

## 2023-04-02 DIAGNOSIS — R0989 Other specified symptoms and signs involving the circulatory and respiratory systems: Secondary | ICD-10-CM

## 2023-04-02 DIAGNOSIS — I6523 Occlusion and stenosis of bilateral carotid arteries: Secondary | ICD-10-CM

## 2023-04-02 DIAGNOSIS — R202 Paresthesia of skin: Secondary | ICD-10-CM

## 2023-04-02 NOTE — Telephone Encounter (Signed)
MRI has been printed and placed in folder for review.

## 2023-04-03 ENCOUNTER — Other Ambulatory Visit (INDEPENDENT_AMBULATORY_CARE_PROVIDER_SITE_OTHER): Payer: Self-pay

## 2023-04-03 DIAGNOSIS — I6523 Occlusion and stenosis of bilateral carotid arteries: Secondary | ICD-10-CM

## 2023-04-08 ENCOUNTER — Encounter (INDEPENDENT_AMBULATORY_CARE_PROVIDER_SITE_OTHER): Payer: Self-pay | Admitting: Medical

## 2023-04-08 ENCOUNTER — Other Ambulatory Visit: Payer: Self-pay

## 2023-04-08 ENCOUNTER — Ambulatory Visit: Payer: MEDICAID | Attending: Medical | Admitting: Medical

## 2023-04-08 VITALS — BP 148/80 | HR 67 | Resp 12 | Ht 69.0 in | Wt 250.0 lb

## 2023-04-08 DIAGNOSIS — Z716 Tobacco abuse counseling: Secondary | ICD-10-CM

## 2023-04-08 DIAGNOSIS — K7030 Alcoholic cirrhosis of liver without ascites: Secondary | ICD-10-CM | POA: Insufficient documentation

## 2023-04-08 DIAGNOSIS — F17210 Nicotine dependence, cigarettes, uncomplicated: Secondary | ICD-10-CM

## 2023-04-08 NOTE — Progress Notes (Signed)
GASTROENTEROLOGY, [facility] [address]  [place] [address]  Operated by [facility]     Name: Hector [name] MRN:  [id number]   Date: 04/08/2023 Age: 62 y.o.     Chief Complaint: Cirrhosis (Patient is following up to cirrhosis.  He currently does not take lactulose. He state that he has not needed to), Colonoscopy (Patient is due for a repeat colon in 2024), and Barrett's Esophagus (Patient is currently doing well. He denies any abdominal pain, N/V or dysphagia )     Subjective:   Patient presents for follow up regarding cirrhosis. Patient is currently not needing diuretics.  He is still smoking and has not been able to get treatments to help stop yet.  He is still eating a lot of ice cream but is not gaining weight, down 9 pounds since last visit 6 months ago.  No dysphagia. He is taking Protonix 40mg  BID. No heartburn, vomiting. He has nausea daily.  Bowels are moving normal. No alcohol. He has had some increased confusion for a few months. He was in a coma over a year ago. He is getting evaluation for carotid arteries. He has increased dyspnea.    Past Medical History  Current Outpatient Medications   Medication Sig    fluticasone propion-salmeteroL (ADVAIR HFA) 230-21 mcg/actuation Inhalation oral inhaler Take 2 Puffs by inhalation Twice daily    ipratropium-albuterol 0.5 mg-3 mg(2.5 mg base)/3 mL Solution for Nebulization NEBULIZE 1 VIAL 3 TIMES A DAY (8 HOURS APART) AS NEEDED FOR COUGH, SHORTNESS OF BREATH, WHEEZING    nicotine (NICODERM CQ) 21 mg/24 hr Transdermal Patch 24 hr Place 1 Patch (21 mg total) on the skin Once a day for 30 days    nystatin (NYSTOP) 100,000 unit/gram Powder APPLY TO AFFECTED AREA TWICE A DAY (Patient not taking: Reported on 03/31/2023)    OLANZapine (ZYPREXA) 10 mg Oral Tablet Take 1 Tablet (10 mg total) by mouth Every night    oxygen (O2) Inhalation gas Administer 4 L/min into affected nostril(s) continuous    pantoprazole (PROTONIX) 40 mg Oral Tablet,  Delayed Release (E.C.) TAKE 1 TABLET (40 MG TOTAL) BY MOUTH TWICE A DAY 30 MINTUTES BEFORE MEALS FOR 90 DAYS    rosuvastatin (CRESTOR) 5 mg Oral Tablet TAKE 1 TABLET BY MOUTH EVERY DAY IN THE EVENING    sertraline (ZOLOFT) 100 mg Oral Tablet Take 2 Tablets (200 mg total) by mouth Once a day for 30 days    traZODone (DESYREL) 50 mg Oral Tablet Take 1 Tablet (50 mg total) by mouth Every night Unsure dosage    VENTOLIN HFA 90 mcg/actuation Inhalation oral inhaler TAKE 1-2 PUFFS BY INHALATION EVERY 6 HOURS AS NEEDED FOR SHORTNESS OF BREATH, COUGH, & WHEEZING     Past Medical History:   Diagnosis Date    Alcohol abuse     Chronic obstructive airway disease (CMS HCC)     Cirrhosis (CMS HCC)     Coma (CMS [facility])     after fall due to alcholism May 2022    Delirium, withdrawal, alcoholic (CMS HCC)     Depression     HTN (hypertension)     Unknown cause of injury     fx nose    Wears glasses      Social History     Socioeconomic History    Marital status: Divorced   Tobacco Use    Smoking status: Every Day     Current packs/day: 1.00  Average packs/day: 1 pack/day for 50.3 years (50.3 ttl pk-yrs)     Types: Cigarettes     Start date: 53    Smokeless tobacco: Never    Tobacco comments:     THE PATIENT STARTED TO USE TOPICAL NICOTINE PATCHES FOR SMOKING CESSSATION (10/17/2022),CURRENTLY SMOKES 1/2 PACK CIGARETTES DAILY   Vaping Use    Vaping status: Never Used   Substance and Sexual Activity    Alcohol use: Not Currently     Alcohol/week: 15.0 standard drinks of alcohol     Types: 15 Cans of beer per week     Comment:  may 17th, 2022     Drug use: Yes     Types: Marijuana     Comment:  a joint or 2 a day     Sexual activity: Yes     Social Determinants of Health     Financial Resource Strain: Low Risk  (10/21/2022)    Financial Resource Strain     SDOH Financial: No   Transportation Needs: Low Risk  (10/21/2022)    Transportation Needs     SDOH Transportation: No   Social Connections: Medium Risk (10/21/2022)    Social  Connections     SDOH Social Isolation: 3 to 5 times a week   Intimate Partner Violence: Low Risk  (10/21/2022)    Intimate Partner Violence     SDOH Domestic Violence: No   Housing Stability: Low Risk  (10/21/2022)    Housing Stability     SDOH Housing Situation: I have housing.     SDOH Housing Worry: No        ROS: see HPI    Objective:    BP (!) 148/80   Pulse 67   Resp 12   Ht 1.753 m (5\' 9" )   Wt 113 kg (250 lb)   BMI 36.92 kg/m     General: A&Ox3, no acute distress  HEENT: sclera anicteric  Heart: RRR, no murmurs  Lungs: rhonchorous anteriorly and posteriorly bilaterally  Abdomen:  soft, bowel sounds present x 4, epigastric region tender, fluid wave not appreciated  Extremities: 2+ lower extremity edema  Psych: no depression/anxiety    Labs: COMPREHENSIVE METABOLIC PANEL   Lab Results   Component Value Date    SODIUM 143 10/15/2022    POTASSIUM 4.5 10/15/2022    CHLORIDE 112 (H) 10/15/2022    CO2 23 10/15/2022    ANIONGAP 8 10/15/2022    BUN 15 10/15/2022    CREATININE 1.01 10/15/2022    GLUCOSENF 108 (H) 10/15/2022    CALCIUM 9.1 10/15/2022    PHOSPHORUS 4.2 05/24/2021    ALBUMIN 4.0 10/15/2022    TOTALPROTEIN 7.7 10/15/2022    ALKPHOS 125 10/15/2022    AST 52 10/15/2022    ALT 35 10/15/2022    BILIRUBINCON 0.0 05/13/2021     Hemogram   Lab Results   Component Value Date/Time    WBC 4.5 10/15/2022 10:09 AM    HGB 13.8 (L) 10/15/2022 10:09 AM    HCT 40.8 10/15/2022 10:09 AM    PLTCNT 65 (L) 10/15/2022 10:09 AM    RBC 4.33 10/15/2022 10:09 AM    MCV 94.2 10/15/2022 10:09 AM    MCHC 33.9 10/15/2022 10:09 AM    MCH 31.9 10/15/2022 10:09 AM    RDW 15.7 (H) 10/15/2022 10:09 AM    MPV 8.9 10/15/2022 10:09 AM        PROTHROMBIN TIME and INR  Lab Results   Component Value  Date    PROTHROMTME 11.8 10/15/2022    INR 1.16 (H) 10/15/2022       Component  Ref Range & Units 5 mo ago 6 mo ago 1 yr ago   AFP TUMOR MARKER  <=9 ng/mL 6 5 CM 6 CM     Radiology:   Recent Results (from the past [id number] hour(s))   [place] RT UPPER  QUADRANT    Collection Time: 10/15/22 10:09 AM    Narrative    Aurther Loft LEE Costantino    RADIOLOGIST: Markus Jarvis, MD    [place] RT UPPER QUADRANT performed on 10/15/2022 10:09 AM    CLINICAL HISTORY: K70.30: Alcoholic cirrhosis, unspecified whether ascites present (CMS HCC).  Cirrhosis.    COMPARISON:  None.    FINDINGS:  Liver: Nodular and course.    Gallbladder:  Multiple echogenic gallstones are identified.      Common bile duct:  Normal measuring  3.5 mm.    Pancreas: Obscured by bowel gas.    Visualized portions of the right kidney are unremarkable.  No right upper quadrant ascites.        Impression    THERE ARE FINDINGS CONSISTENT WITH HISTORY OF CIRRHOSIS. NO FOCAL ADENOPATHY IS IDENTIFIED.    CHOLELITHIASIS.        Radiologist location ID: [id number]     US LIVER    Collection Time: 04/24/22 10:32 AM    Narrative    Aurther Loft LEE Ransome    RADIOLOGIST: Wynema Birch, MD    US LIVER performed on 04/24/2022 10:32 AM    CLINICAL HISTORY: K70.30: Alcoholic cirrhosis, unspecified whether ascites present (CMS HCC).  cirrhosis    COMPARISON:  10/22/2021    FINDINGS:  Liver: Cirrhotic morphology. No ultrasound evidence of mass. Coarsened echotexture.    Gallbladder:  There is some mild gallbladder wall thickening. There are no stones or any pericholecystic fluid. There is no reported sonographic Murphy sign. There is some mobile debris.    Common bile duct:  Normal measuring  3.5 mm.    Pancreas: Visualized portions are sonographically unremarkable.    Visualized portions of the right kidney are unremarkable.  No right upper quadrant ascites.        Impression    CIRRHOSIS WITHOUT ULTRASOUND EVIDENCE OF A MASS.        Radiologist location ID: [id number]       Pathology:No results found for this or any previous visit (from the past 720 hour(s)).     Problem List Items Addressed This Visit       Alcoholic cirrhosis (CMS HCC) - Primary    Relevant Orders    ALPHA FETOPROTEIN (AFP) TUMOR MARKER    AMMONIA    US LIVER         Plan:   The patient was seen independently by myself with Dr. Alphonzo Severance available for consultation.    cirrhosis  - etiology- alcohol  - MELD- 8 based on labs dated 10/15/22  - ascites- not appreciated. Low salt diet. Avoid NSAIDs  - encephalopathy- patient and wife reports some increased confusion from baseline.  Will check ammonia level.  May start lactulose and titrate to 3-4 bowel movements daily pending results  - PHTN- last EGD 09/30/22. Repeat was recommended in 1-2 years.  Will re-evaluate at follow-up  - HCC screening- last imaging [place] 10/15/22- no lesions. last AFP normal 10/15/22. Repeat every 6 months. Will re-order today  - immunizations- patient reports completing these through PCP    2.  colon cancer screening  - done 08/26/21- fair prep and multiple polyps.   - repeat due 2024.  Will readdress at follow-up     3. Barretts esophagus  - EGD 09/30/22- positive Barretts, no dysplasia  - continue Protonix b.i.d. for now  - Tobacco cessation counseling performed > 3 minutes    Return to Clinic: Return in about 3 months (around 07/08/2023).    Consuelo Pandy, PA-C     DISCLAIMER: This note was partially created using voice recognition software which is subject to errors that may escape proof reading. Please pardon mistakes made by MModal. If typographical errors are noted and/or phrasing does not make sense, please contact me for corrections. I reserve the right to change note to correct mistakes related to MModal misinterpretation.

## 2023-04-08 NOTE — Patient Instructions (Signed)
Call PCP about increased leg swelling and shortness of breath

## 2023-04-10 ENCOUNTER — Other Ambulatory Visit (INDEPENDENT_AMBULATORY_CARE_PROVIDER_SITE_OTHER): Payer: Self-pay | Admitting: Medical

## 2023-04-13 ENCOUNTER — Other Ambulatory Visit: Payer: Self-pay

## 2023-04-13 ENCOUNTER — Other Ambulatory Visit: Payer: MEDICAID | Attending: Medical

## 2023-04-13 DIAGNOSIS — E785 Hyperlipidemia, unspecified: Secondary | ICD-10-CM | POA: Insufficient documentation

## 2023-04-13 DIAGNOSIS — K7030 Alcoholic cirrhosis of liver without ascites: Secondary | ICD-10-CM | POA: Insufficient documentation

## 2023-04-13 LAB — CBC WITH DIFF
BASOPHIL #: 0 10*3/uL (ref 0.00–0.20)
BASOPHIL %: 1 % (ref 0–2)
EOSINOPHIL #: 0.1 10*3/uL (ref 0.00–0.60)
EOSINOPHIL %: 2 % (ref 0–5)
HCT: 40.8 % (ref 36.0–46.0)
HGB: 14.1 g/dL (ref 13.9–16.3)
LYMPHOCYTE #: 1.2 10*3/uL (ref 1.10–3.80)
LYMPHOCYTE %: 24 % (ref 19–46)
MCH: 32.5 pg (ref 25.4–34.0)
MCHC: 34.7 g/dL (ref 30.0–37.0)
MCV: 93.7 fL (ref 80.0–100.0)
MONOCYTE #: 0.5 10*3/uL (ref 0.10–0.80)
MONOCYTE %: 9 % (ref 4–12)
MPV: 9.2 fL (ref 7.5–11.5)
NEUTROPHIL #: 3.1 10*3/uL (ref 1.80–7.50)
NEUTROPHIL %: 64 % (ref 41–69)
PLATELETS: 69 10*3/uL — ABNORMAL LOW (ref 130–400)
RBC: 4.35 10*6/uL (ref 4.30–5.90)
RDW: 15.6 % — ABNORMAL HIGH (ref 11.5–14.0)
WBC: 4.9 10*3/uL (ref 4.5–11.5)

## 2023-04-13 LAB — LIPID PANEL
CHOLESTEROL: 161 mg/dL (ref 0–200)
HDL CHOL: 32 mg/dL — ABNORMAL LOW (ref >40)
LDL CALC: 114 mg/dL — ABNORMAL HIGH (ref <100)
TRIGLYCERIDES: 74 mg/dL (ref <150)

## 2023-04-13 LAB — COMPREHENSIVE METABOLIC PANEL, NON-FASTING
ALBUMIN/GLOBULIN RATIO: 1.1 — ABNORMAL LOW (ref 1.5–2.5)
ALBUMIN: 3.7 g/dL (ref 3.5–5.0)
ALKALINE PHOSPHATASE: 144 U/L — ABNORMAL HIGH (ref 38–126)
ALT (SGPT): 34 U/L (ref <50)
ANION GAP: 8 mmol/L (ref 5–19)
AST (SGOT): 49 U/L (ref 17–59)
BILIRUBIN TOTAL: 1.1 mg/dL (ref 0.2–1.3)
BUN/CREA RATIO: 17 (ref 6–20)
BUN: 14 mg/dL (ref 9–20)
CALCIUM: 9.1 mg/dL (ref 8.4–10.2)
CHLORIDE: 108 mmol/L — ABNORMAL HIGH (ref 98–107)
CO2 TOTAL: 23 mmol/L (ref 22–30)
CREATININE: 0.82 mg/dL (ref 0.66–1.20)
ESTIMATED GFR: >60 mL/min/{1.73_m2} (ref >60)
GLUCOSE: 101 mg/dL (ref 74–106)
POTASSIUM: 4.2 mmol/L (ref 3.5–5.1)
PROTEIN TOTAL: 7.2 g/dL (ref 6.3–8.2)
SODIUM: 139 mmol/L (ref 137–145)

## 2023-04-13 LAB — SCAN DIFFERENTIAL
PLATELET ESTIMATE: DECREASED
RBC MORPHOLOGY COMMENT: NORMAL
WBC MORPHOLOGY COMMENT: NORMAL

## 2023-04-13 LAB — ALPHA FETOPROTEIN (AFP) TUMOR MARKER: AFP TUMOR MARKER: 4 ng/mL (ref <=9)

## 2023-04-13 LAB — AMMONIA: AMMONIA: 47 umol/L — ABNORMAL HIGH (ref 9–30)

## 2023-04-15 ENCOUNTER — Other Ambulatory Visit (INDEPENDENT_AMBULATORY_CARE_PROVIDER_SITE_OTHER): Payer: Self-pay | Admitting: Internal Medicine

## 2023-04-15 DIAGNOSIS — E785 Hyperlipidemia, unspecified: Secondary | ICD-10-CM

## 2023-04-15 MED ORDER — ROSUVASTATIN 5 MG TABLET
10.0000 mg | ORAL_TABLET | Freq: Every evening | ORAL | 1 refills | Status: DC
Start: 2023-04-15 — End: 2023-06-08

## 2023-04-20 ENCOUNTER — Encounter: Payer: Self-pay | Admitting: Family Medicine

## 2023-04-21 ENCOUNTER — Encounter (INDEPENDENT_AMBULATORY_CARE_PROVIDER_SITE_OTHER): Payer: Self-pay | Admitting: Nurse Practitioner

## 2023-04-21 ENCOUNTER — Other Ambulatory Visit: Payer: Self-pay

## 2023-04-21 ENCOUNTER — Ambulatory Visit (INDEPENDENT_AMBULATORY_CARE_PROVIDER_SITE_OTHER): Payer: MEDICAID | Admitting: Nurse Practitioner

## 2023-04-21 VITALS — BP 142/72 | HR 72 | Temp 98.0°F | Resp 18 | Ht 69.0 in | Wt 249.0 lb

## 2023-04-21 DIAGNOSIS — Z9981 Dependence on supplemental oxygen: Secondary | ICD-10-CM

## 2023-04-21 DIAGNOSIS — J449 Chronic obstructive pulmonary disease, unspecified: Secondary | ICD-10-CM

## 2023-04-21 DIAGNOSIS — R0609 Other forms of dyspnea: Secondary | ICD-10-CM

## 2023-04-21 DIAGNOSIS — Z716 Tobacco abuse counseling: Secondary | ICD-10-CM

## 2023-04-21 DIAGNOSIS — F17210 Nicotine dependence, cigarettes, uncomplicated: Secondary | ICD-10-CM

## 2023-04-21 DIAGNOSIS — Z6836 Body mass index (BMI) 36.0-36.9, adult: Secondary | ICD-10-CM

## 2023-04-21 DIAGNOSIS — F1290 Cannabis use, unspecified, uncomplicated: Secondary | ICD-10-CM

## 2023-04-21 MED ORDER — ALBUTEROL SULFATE HFA 90 MCG/ACTUATION AEROSOL INHALER
2.0000 | INHALATION_SPRAY | Freq: Four times a day (QID) | RESPIRATORY_TRACT | 5 refills | Status: DC | PRN
Start: 2023-04-21 — End: 2023-10-28

## 2023-04-21 MED ORDER — FLUTICASONE PROPIONATE 230 MCG-SALMETEROL 21 MCG/ACTUATION HFA INHALER
2.0000 | INHALATION_SPRAY | Freq: Two times a day (BID) | RESPIRATORY_TRACT | 5 refills | Status: DC
Start: 2023-04-21 — End: 2023-10-28

## 2023-04-21 NOTE — Progress Notes (Signed)
PULMONOLOGY, [facility]  [phone]. [place] [address]      Pulmonary Clinic Follow Up    Patient Name: Hector [name]  Date: 04/21/2023  Department:  PULMONOLOGY, [facility]  MRN: [id number]  DOB: 1961-04-21     Follow Up and COPD (Patient returns today for a follow up on COPD. ).    History of Present Illness: Hector [name] is a 62 year old male who presents to the Pulmonary Clinic for a 6 month follow-up visit for COPD,nicotine dependence.  The patient has a past medical history of alcohol abuse, chronic obstructive pulmonary disease, cirrhosis, after fall related to alcoholism (04/2021), delirium withdrawal from alcohol, depression, hypertension, nose fracture, nicotine dependence.  The patient admits that he increased his smoking to one pack cigarettes daily,two marijuana joints,uses oxygen 4 liters per nasal cannula continuously,Advair HFA 231-88mcg two puffs twice a day and short acting bronchodilator Albuterol twice a day,nebulizer treatment as needed,last treatment 2 days ago.  The patient remarks that he has shortness of breath upon exertion,no worse in the past 6 month,productive cough in the evening dime size of thick clear mucus with inspiratory wheezing. Pulmonary Function Test ( September 10, 2021 forced vital capacity 77% of predicted mild reduction, FEV1 78% of predicted mild reduction in FEV1 FVC ratio 75.91 within normal limits noted reduction in diffusion 59% no gross evidence of obstructive lung disease, no hyperinflation, no significant improvement in the FEV1 with a bronchodilator no restrictive lung disease.   Low Dose CT of Chest for lung cancer screening (11/12/2022):  Negative results annual image (11/13/2023).      Medications    Current Outpatient Medications:     albuterol sulfate (VENTOLIN HFA) 90 mcg/actuation Inhalation oral inhaler, Take 2 Puffs by inhalation Every 6 hours as needed for Other (SHORTNESS OF BREATH,COUGH OR  WHEEZING), Disp: 18 Each, Rfl: 5    fluticasone propion-salmeteroL (ADVAIR HFA) 230-21 mcg/actuation Inhalation oral inhaler, Take 2 Puffs by inhalation Twice daily, Disp: 1 g, Rfl: 5    ipratropium-albuterol 0.5 mg-3 mg(2.5 mg base)/3 mL Solution for Nebulization, NEBULIZE 1 VIAL 3 TIMES A DAY (8 HOURS APART) AS NEEDED FOR COUGH, SHORTNESS OF BREATH, WHEEZING, Disp: 270 mL, Rfl: 5    nicotine (NICODERM CQ) 21 mg/24 hr Transdermal Patch 24 hr, Place 1 Patch (21 mg total) on the skin Once a day for 30 days (Patient not taking: Reported on 04/21/2023), Disp: 30 Patch, Rfl: 0    nystatin (NYSTOP) 100,000 unit/gram Powder, APPLY TO AFFECTED AREA TWICE A DAY (Patient not taking: Reported on 03/31/2023), Disp: 60 g, Rfl: 1    OLANZapine (ZYPREXA) 10 mg Oral Tablet, Take 1 Tablet (10 mg total) by mouth Every night, Disp: , Rfl:     oxygen (O2) Inhalation gas, Administer 4 L/min into affected nostril(s) continuous, Disp: , Rfl:     pantoprazole (PROTONIX) 40 mg Oral Tablet, Delayed Release (E.C.), TAKE 1 TABLET (40 MG TOTAL) BY MOUTH TWICE A DAY 30 MINTUTES BEFORE MEALS FOR 90 DAYS, Disp: 60 Tablet, Rfl: 3    rosuvastatin (CRESTOR) 5 mg Oral Tablet, Take 2 Tablets (10 mg total) by mouth Every evening, Disp: 90 Tablet, Rfl: 1    sertraline (ZOLOFT) 100 mg Oral Tablet, Take 2 Tablets (200 mg total) by mouth Once a day for 30 days, Disp: 30 Tablet, Rfl: 0    traZODone (DESYREL) 50 mg Oral Tablet, Take 1 Tablet (50 mg total) by mouth Every night Unsure  dosage, Disp: , Rfl:   Allergies  Allergies   Allergen Reactions    Bee Venom Protein (Honey Bee)      Bee stings      Vitals  Vitals:    04/21/23 1254   BP: (!) 142/72   Pulse: 72   Resp: 18   Temp: 36.7 C (98 F)   TempSrc: Temporal   SpO2: 94%   Weight: 113 kg (249 lb)   Height: 1.753 m (5\' 9" )   BMI: 36.85      There are no exam notes on file for this visit.   Past Medical History  Past Medical History:   Diagnosis Date    Alcohol abuse     Chronic obstructive airway disease  (CMS HCC)     Cirrhosis (CMS HCC)     Coma (CMS [facility])     after fall due to alcholism May 2022    Delirium, withdrawal, alcoholic (CMS HCC)     Depression     HTN (hypertension)     Unknown cause of injury     fx nose    Wears glasses          Past Surgical History:   Procedure Laterality Date    COLONOSCOPY  08/2021    COLONOSCOPY WITH COLD SNARE POLYPECTOMY N/A 08/26/2021    Performed by Lowella Petties, MD at [facility] OR ENDO    ESOPHAGOGASTRODUODENOSCOPY  08/2021    GASTROSCOPY WITH BIOPSY N/A 09/30/2022    Performed by Lowella Petties, MD at [facility] OR ENDO    GASTROSCOPY WITH BIOPSY N/A 08/26/2021    Performed by Lowella Petties, MD at [facility] OR ENDO    HX TONSILLECTOMY        Family Medical History:       Problem Relation (Age of Onset)    Asthma Father    Cerebral Aneurysm Sister            Social History     Socioeconomic History    Marital status: Divorced   Tobacco Use    Smoking status: Every Day     Current packs/day: 1.00     Average packs/day: 1 pack/day for 50.3 years (50.3 ttl pk-yrs)     Types: Cigarettes     Start date: 1974    Smokeless tobacco: Never    Tobacco comments:     THE PATIENT STARTED TO USE TOPICAL NICOTINE PATCHES FOR SMOKING CESSSATION (10/17/2022),CURRENTLY SMOKES 1/2 PACK CIGARETTES DAILY   Vaping Use    Vaping status: Never Used   Substance and Sexual Activity    Alcohol use: Not Currently     Alcohol/week: 15.0 standard drinks of alcohol     Types: 15 Cans of beer per week     Comment:  may 17th, 2022     Drug use: Yes     Types: Marijuana     Comment:  a joint or 2 a day     Sexual activity: Yes     Social Determinants of Health     Financial Resource Strain: Low Risk  (10/21/2022)    Financial Resource Strain     SDOH Financial: No   Transportation Needs: Low Risk  (10/21/2022)    Transportation Needs     SDOH Transportation: No   Social Connections: Medium Risk (10/21/2022)    Social Connections     SDOH Social Isolation: 3 to 5 times a week   Intimate Partner Violence: Low Risk  (10/21/2022)  Intimate  Partner Violence     SDOH Domestic Violence: No   Housing Stability: Low Risk  (10/21/2022)    Housing Stability     SDOH Housing Situation: I have housing.     SDOH Housing Worry: No      Outpatient Medications:  Current Outpatient Medications   Medication Sig    albuterol sulfate (VENTOLIN HFA) 90 mcg/actuation Inhalation oral inhaler Take 2 Puffs by inhalation Every 6 hours as needed for Other (SHORTNESS OF BREATH,COUGH OR WHEEZING)    fluticasone propion-salmeteroL (ADVAIR HFA) 230-21 mcg/actuation Inhalation oral inhaler Take 2 Puffs by inhalation Twice daily    ipratropium-albuterol 0.5 mg-3 mg(2.5 mg base)/3 mL Solution for Nebulization NEBULIZE 1 VIAL 3 TIMES A DAY (8 HOURS APART) AS NEEDED FOR COUGH, SHORTNESS OF BREATH, WHEEZING    nicotine (NICODERM CQ) 21 mg/24 hr Transdermal Patch 24 hr Place 1 Patch (21 mg total) on the skin Once a day for 30 days (Patient not taking: Reported on 04/21/2023)    nystatin (NYSTOP) 100,000 unit/gram Powder APPLY TO AFFECTED AREA TWICE A DAY (Patient not taking: Reported on 03/31/2023)    OLANZapine (ZYPREXA) 10 mg Oral Tablet Take 1 Tablet (10 mg total) by mouth Every night    oxygen (O2) Inhalation gas Administer 4 L/min into affected nostril(s) continuous    pantoprazole (PROTONIX) 40 mg Oral Tablet, Delayed Release (E.C.) TAKE 1 TABLET (40 MG TOTAL) BY MOUTH TWICE A DAY 30 MINTUTES BEFORE MEALS FOR 90 DAYS    rosuvastatin (CRESTOR) 5 mg Oral Tablet Take 2 Tablets (10 mg total) by mouth Every evening    sertraline (ZOLOFT) 100 mg Oral Tablet Take 2 Tablets (200 mg total) by mouth Once a day for 30 days    traZODone (DESYREL) 50 mg Oral Tablet Take 1 Tablet (50 mg total) by mouth Every night Unsure dosage       Review of system:  General:  No fever, chills, fatigue,2 pound weight loss in 6 months, appetite change.  No cold sweats at night.  Cardiac:  No chest pain, pressure, palpitations, hands or ankle edema.  Respiratory:  Positive shortness of breath upon  exertion,Productive evening cough,Inspiratory  wheezing.  No hemoptysis.  No snoring, gasping for air, nocturnal cough. No witnessed apnea.  Neurological:  No headache or dizziness.  HEENT:  No visual changes, Positive nasal congestion and post nasal drainage.  No neck stiffness, sore throat or swollen glands.  GI:  No dysphagia, nausea, vomiting, constipation, diarrhea.  No blood in stool.  Urinary:  No blood in urine.  Skin:  No rash.    PHYSICAL EXAMINATION:   Constitutional:  Patient alert and oriented, no acute distress, no comfortable appearing.Obese BMI 36.85,patient ambulated 100 feet to examination room mild dyspnea,Oximetry 94% with 4 liters nasal cannula oxygen,patient has odor of cigarette and marijuana on person and clothing  General appearance of the patient   HEENT:  Head is normocephalic, atraumatic.  Tympanic membranes are easily visualized , transparent and pearly gray in color with normal light reflex noted bilaterally.  Nasal turbinates are pink and moist without inflammation or erythema. Nasal vestibule free of rhinorrhea.   Nasal vestibule shows evidence of nicotine stain mucosa  No tenderness upon palpation of sinus areas. Oral mucosa is pink and moist without lesions, no pharyngeal erythremia or exudate noted.  Trachea midline Neck supple without masses, no lymphadenopathy.  No jugular vein distention.  No thyromegaly.   Cardiovascular:   Apical 72 regular rate, rhythm, normal S1, S2  heard, no Murmurs, rubs, or gallops auscultated.  No carotid bruit.  No ankle edema.  Lungs:  Auscultation anterior and posterior, non labored respirations,no, rales or rhonchi, Thorax is symmetrical, chest walls expand equally, no pain upon palpation of upper anterior thorax, expirations not prolonged, no intercostal retractions.  Effort good, No Egophony, No dullness upon percussion.Fine expiratory wheezes noted posterior right upper and mid lobes  Abdomen: Abdomen soft, non-distended, non-tender.  Musc: Normal  gait and station, no digital cyanosis or clubbing.  Skin:  Color flesh tone.  Skin warm and dry.  No ecchymosis seen, nail beds pink, no clubbing, capillary refill less than four seconds, no rash, lesion or ulcers.   Psych:  Normal affect, interaction, and speech, cooperative, informative, pleasant.    Assessment:  (J44.9) Chronic obstructive pulmonary disease, unspecified COPD type (CMS HCC)  (primary encounter diagnosis)  Plan: fluticasone propion-salmeteroL (ADVAIR HFA)         230-21 mcg/actuation Inhalation oral inhaler,         albuterol sulfate (VENTOLIN HFA) 90         mcg/actuation Inhalation oral inhaler    (F17.210) Cigarette smoker    (Z71.6) Tobacco abuse counseling    (F12.90) Marijuana smoker    (R06.09) Dyspnea on exertion    (Z99.81) Dependence on supplemental oxygen       Plan:    COPD, fairly controlled,current cigarette and marijuana smoker  Again, smoking cessation discussed with the patient  Pulmonary nurse called the patient's pharmacy and Nicoderm CG 21 mg daily patch prescription per (03/31/2023) primary care provider filled for the patient,the patient agrees to use prescription for smoking cessation,discussed progress of lung disease,risk of cardiac events with smoking  Continue current maintenance inhaler Advair 230-4mcg 2 puffs twice a day,increase Duoneb treatments to 3 times a day (At least morning and evening)  Continue oxygen 4 liters per nasal cannula oxygen,patient benefits from oxygen therapy    The patient was given the opportunity to ask questions and those questions were answered to the patient's satisfaction. The patient was encouraged to call with any additional questions or concerns.   Discussed with patient effects and side effects of medications. Medication safety was discussed  Detailed time spent for preparation of today's encounter,review of documents, interviewing/examining the patient,providing education/counselling,completing electronic orders and progress notes for  the patient's plan of care.      Electronically signed by Loretta Plume, CFNP  Pulmonary

## 2023-04-23 ENCOUNTER — Other Ambulatory Visit: Payer: Self-pay

## 2023-04-25 ENCOUNTER — Encounter: Payer: Self-pay | Admitting: Family Medicine

## 2023-04-27 ENCOUNTER — Inpatient Hospital Stay
Admission: RE | Admit: 2023-04-27 | Discharge: 2023-04-27 | Disposition: A | Discharge status: Home / Self Care | Payer: MEDICAID | Source: Ambulatory Visit | Attending: VASCULAR SURGERY | Admitting: VASCULAR SURGERY

## 2023-04-27 ENCOUNTER — Other Ambulatory Visit: Payer: Self-pay

## 2023-04-27 DIAGNOSIS — I6523 Occlusion and stenosis of bilateral carotid arteries: Secondary | ICD-10-CM | POA: Insufficient documentation

## 2023-04-27 MED ORDER — IOPAMIDOL 300 MG IODINE/ML (61 %) INTRAVENOUS SOLUTION
120.0000 mL | INTRAVENOUS | Status: AC
Start: 2023-04-27 — End: 2023-04-27
  Administered 2023-04-27: 120 mL via INTRAVENOUS

## 2023-04-28 NOTE — Progress Notes (Unsigned)
[facility] [address], [address] [place], [place] [address] [phone]  Date:  04/29/2023   Name:  Curtis Luna   DOB:  08-05-1961   MRN:  [id number]  PCP:  Pearline Cables, MD    Chief Complaint: No chief complaint on file.   History of Present Illness:  Curtis Luna is a 62 y.o. very pleasant male patient who presents with the following:  Patient seen today with concern about a bothersome hemorrhoid Seen by myself last month for concern of upper extremity numbness He does have history of coronary artery disease, ocular migraine, GERD, hyperlipidemia He contacted me recently with concern about persistent hemorrhoid which is quite uncomfortable  Patient Active Problem List   Diagnosis Date Noted   Allergic reaction to vaccine 11/12/2020   Toe pain, right 08/23/2018   Pain of left heel 06/27/2018   Multiple thyroid nodules 11/05/2015   Peripheral neuropathy 04/18/2015   Vertigo 04/15/2015   FH: brain aneurysm 04/15/2015   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   Seasonal affective disorder (HCC) 10/02/2014   Internal hemorrhoid 04/20/2014   Chronic low back pain 04/20/2014   Anxiety 04/20/2014   Esophageal reflux 04/04/2011   Diverticulosis of colon (without mention of hemorrhage) 04/04/2011    Past Medical History:  Diagnosis Date   Abdominal pain, right lower quadrant    Acute bronchospasm    Allergy    Anxiety    on and off   Backache, unspecified    Chicken pox    Diverticulosis of colon (without mention of hemorrhage)    Esophageal reflux    External hemorrhoids without mention of complication    thrombosed   Hiatal hernia    Hyperlipidemia    no medicines    Inguinal hernia without mention of obstruction or gangrene, unilateral or unspecified, (not specified as recurrent)    Migraine    Obstructive sleep apnea (adult) (pediatric)    Other symptoms involving digestive system(787.99)     Pain in joint, forearm    Pain in joint, shoulder region    Seasonal allergies    Sleep apnea    no cpap but uses dental device    Thoracic or lumbosacral neuritis or radiculitis, unspecified     Past Surgical History:  Procedure Laterality Date   COLON RESECTION  10/06   sigmoid colon   COLONOSCOPY     INGUINAL HERNIA REPAIR  12/2012   Left   UPPER GASTROINTESTINAL ENDOSCOPY     WISDOM TOOTH EXTRACTION      Social History   Tobacco Use   Smoking status: Former    Types: Cigarettes, Cigars    Quit date: 10/04/1987    Years since quitting: 35.5   Smokeless tobacco: Never  Vaping Use   Vaping Use: Never used  Substance Use Topics   Alcohol use: Yes    Alcohol/week: 7.0 - 14.0 standard drinks of alcohol    Types: 7 - 14 Standard drinks or equivalent per week    Comment: 2 daily    Drug use: No    Family History  Problem Relation Age of Onset   Other Father        MVA   Lung cancer Mother 52       Deceased   Diabetes Maternal Aunt    Heart disease Paternal Grandmother    Heart attack Paternal Grandmother    Healthy Brother  x2   Aneurysm Sister        Brain   Allergies Daughter        x2   Migraines Daughter        x1   Colon cancer Neg Hx    Colon polyps Neg Hx    Esophageal cancer Neg Hx    Rectal cancer Neg Hx    Stomach cancer Neg Hx    Pancreatic cancer Neg Hx    Allergic rhinitis Neg Hx    Angioedema Neg Hx    Asthma Neg Hx    Atopy Neg Hx    Eczema Neg Hx    Immunodeficiency Neg Hx    Urticaria Neg Hx     Allergies  Allergen Reactions   Escitalopram Hives    Pt felt anxious  Pt felt his skin was "on fire"   Protonix [Pantoprazole Sodium] Other (See Comments)    Tingling of extremities    Medication list has been reviewed and updated.  Current Outpatient Medications on File Prior to Visit  Medication Sig Dispense Refill   Ascorbic Acid (VITAMIN C) 1000 MG tablet Take 1,000 mg by mouth daily.     b complex vitamins tablet  Take 1 tablet by mouth daily. Every other day     cholecalciferol (VITAMIN D3) 25 MCG (1000 UT) tablet Take 1,000 Units by mouth daily.     EPINEPHrine 0.3 mg/0.3 mL IJ SOAJ injection Inject into the muscle.     gabapentin (NEURONTIN) 100 MG capsule Take 1 capsule (100 mg total) by mouth 3 (three) times daily. 270 capsule 3   LORazepam (ATIVAN) 1 MG tablet Take 1 tablet (1 mg total) by mouth as needed for anxiety. May take 1 per day 30 tablet 0   Multiple Vitamin (MULTIVITAMIN) tablet Take 1 tablet by mouth daily. Men's 50 plus multivitamin daily     predniSONE (DELTASONE) 20 MG tablet Take 2 tablets (40 mg total) by mouth daily for 4 days, then 1 tablet (20 mg total) daily for 4 days. 12 tablet 0   raNITIdine HCl (ZANTAC PO) Take by mouth.     rosuvastatin (CRESTOR) 20 MG tablet Take 1 tablet (20 mg total) by mouth daily. 90 tablet 3   triamcinolone cream (KENALOG) 0.1 % Apply 1 Application topically 2 (two) times daily. Uses as needed for eczema 30 g 1   No current facility-administered medications on file prior to visit.    Review of Systems:  As per HPI- otherwise negative.   Physical Examination: There were no vitals filed for this visit. There were no vitals filed for this visit. There is no height or weight on file to calculate BMI. Ideal Body Weight:    GEN: no acute distress. HEENT: Atraumatic, Normocephalic.  Ears and Nose: No external deformity. CV: RRR, No M/G/R. No JVD. No thrill. No extra heart sounds. PULM: CTA B, no wheezes, crackles, rhonchi. No retractions. No resp. distress. No accessory muscle use. ABD: S, NT, ND, +BS. No rebound. No HSM. EXTR: No c/c/e PSYCH: Normally interactive. Conversant.    Assessment and Plan: ***  Signed Abbe Amsterdam, MD

## 2023-04-28 NOTE — Patient Instructions (Incomplete)
It was good to see you again today, I hope you are feeling better soon

## 2023-04-29 ENCOUNTER — Ambulatory Visit (INDEPENDENT_AMBULATORY_CARE_PROVIDER_SITE_OTHER): Payer: 59 | Admitting: Family Medicine

## 2023-04-29 VITALS — BP 122/72 | HR 75 | Temp 98.0°F | Resp 18 | Ht 64.0 in | Wt 150.4 lb

## 2023-04-29 DIAGNOSIS — K644 Residual hemorrhoidal skin tags: Secondary | ICD-10-CM

## 2023-05-08 ENCOUNTER — Inpatient Hospital Stay
Admission: RE | Admit: 2023-05-08 | Discharge: 2023-05-08 | Disposition: A | Discharge status: Home / Self Care | Payer: MEDICAID | Source: Ambulatory Visit | Attending: Medical

## 2023-05-08 ENCOUNTER — Other Ambulatory Visit: Payer: Self-pay

## 2023-05-08 DIAGNOSIS — K7030 Alcoholic cirrhosis of liver without ascites: Secondary | ICD-10-CM

## 2023-05-20 ENCOUNTER — Encounter (INDEPENDENT_AMBULATORY_CARE_PROVIDER_SITE_OTHER): Payer: Self-pay | Admitting: VASCULAR SURGERY

## 2023-05-20 ENCOUNTER — Other Ambulatory Visit: Payer: Self-pay

## 2023-05-20 ENCOUNTER — Ambulatory Visit (INDEPENDENT_AMBULATORY_CARE_PROVIDER_SITE_OTHER): Payer: MEDICAID | Admitting: VASCULAR SURGERY

## 2023-05-20 VITALS — BP 150/65 | HR 66 | Ht 69.0 in | Wt 249.0 lb

## 2023-05-20 DIAGNOSIS — F1290 Cannabis use, unspecified, uncomplicated: Secondary | ICD-10-CM

## 2023-05-20 DIAGNOSIS — F17200 Nicotine dependence, unspecified, uncomplicated: Secondary | ICD-10-CM

## 2023-05-20 DIAGNOSIS — I6523 Occlusion and stenosis of bilateral carotid arteries: Secondary | ICD-10-CM

## 2023-05-20 DIAGNOSIS — Z01810 Encounter for preprocedural cardiovascular examination: Secondary | ICD-10-CM

## 2023-05-20 NOTE — Progress Notes (Signed)
[facility]   OUTPATIENT CLINIC EVALUATION    Date: 05/20/2023  Patient: Hector [name]  DOB: 16-Jun-1961  PCP: Robin Searing, MD    CHIEF COMPLAINT:   Chief Complaint   Patient presents with    New Patient     Carotid artery stenosis  Cta 04/27/23  Duplex 04/02/23       HPI:   Camile Ruddle is a 62 y.o. male who is seen today in the [facility] for carotid disease.  He has multiple medical problems including history of alcohol abuse, chronic obstructive pulmonary disease requiring oxygen, cirrhosis and hypertension.  He does have a known history of carotid disease but has never undergone carotid revascularization.  Today in the office, hedenies any symptoms of amaurosis, speech disturbance, facial droop or unilateral extremity paresthesia or weakness. He denies any history of stroke in the past.  His current medical regimen includes aspirin and a statin.  He is an active smoker but is currently actively trying to quit.    PMH:   Past Medical History:   Diagnosis Date    Alcohol abuse     Chronic obstructive airway disease (CMS HCC)     Cirrhosis (CMS HCC)     Coma (CMS [facility])     after fall due to alcholism May 2022    Delirium, withdrawal, alcoholic (CMS HCC)     Depression     HTN (hypertension)     Unknown cause of injury     fx nose    Wears glasses        Family Hx:  Family Medical History:       Problem Relation (Age of Onset)    Asthma Father    Cerebral Aneurysm Sister          Medications:  Current Outpatient Medications   Medication Sig Dispense Refill    albuterol sulfate (VENTOLIN HFA) 90 mcg/actuation Inhalation oral inhaler Take 2 Puffs by inhalation Every 6 hours as needed for Other (SHORTNESS OF BREATH,COUGH OR WHEEZING) 18 Each 5    fluticasone propion-salmeteroL (ADVAIR HFA) 230-21 mcg/actuation Inhalation oral inhaler Take 2 Puffs by inhalation Twice daily 1 g 5    ipratropium-albuterol 0.5 mg-3 mg(2.5 mg base)/3 mL Solution for Nebulization NEBULIZE 1 VIAL 3 TIMES A  DAY (8 HOURS APART) AS NEEDED FOR COUGH, SHORTNESS OF BREATH, WHEEZING 270 mL 5    nystatin (NYSTOP) 100,000 unit/gram Powder APPLY TO AFFECTED AREA TWICE A DAY (Patient not taking: Reported on 03/31/2023) 60 g 1    OLANZapine (ZYPREXA) 10 mg Oral Tablet Take 1 Tablet (10 mg total) by mouth Every night      oxygen (O2) Inhalation gas Administer 4 L/min into affected nostril(s) continuous      pantoprazole (PROTONIX) 40 mg Oral Tablet, Delayed Release (E.C.) TAKE 1 TABLET (40 MG TOTAL) BY MOUTH TWICE A DAY 30 MINTUTES BEFORE MEALS FOR 90 DAYS 60 Tablet 3    rosuvastatin (CRESTOR) 5 mg Oral Tablet Take 2 Tablets (10 mg total) by mouth Every evening 90 Tablet 1    sertraline (ZOLOFT) 100 mg Oral Tablet Take 2 Tablets (200 mg total) by mouth Once a day for 30 days 30 Tablet 0    traZODone (DESYREL) 50 mg Oral Tablet Take 1 Tablet (50 mg total) by mouth Every night Unsure dosage       No current facility-administered medications for this visit.   Allergies:  Allergies   Allergen Reactions  Bee Venom Protein (Honey Bee)      Bee stings   Past Surgical Hx:  Past Surgical History:   Procedure Laterality Date    COLONOSCOPY  08/2021    ESOPHAGOGASTRODUODENOSCOPY  08/2021    HX TONSILLECTOMY         Social Hx:  Social History     Socioeconomic History    Marital status: Divorced     Spouse name: Not on file    Number of children: Not on file    Years of education: Not on file    Highest education level: Not on file   Occupational History    Not on file   Tobacco Use    Smoking status: Every Day     Current packs/day: 1.00     Average packs/day: 1 pack/day for 50.4 years (50.4 ttl pk-yrs)     Types: Cigarettes     Start date: 59    Smokeless tobacco: Never    Tobacco comments:     THE PATIENT STARTED    Vaping Use    Vaping status: Never Used   Substance and Sexual Activity    Alcohol use: Not Currently     Alcohol/week: 15.0 standard drinks of alcohol     Types: 15 Cans of beer per week     Comment:  may 17th, 2022      Drug use: Yes     Types: Marijuana     Comment:  a joint or 2 a day     Sexual activity: Yes   Other Topics Concern    Not on file   Social History Narrative    Not on file     Social Determinants of Health     Financial Resource Strain: Low Risk  (10/21/2022)    Financial Resource Strain     SDOH Financial: No   Transportation Needs: Low Risk  (10/21/2022)    Transportation Needs     SDOH Transportation: No   Social Connections: Medium Risk (10/21/2022)    Social Connections     SDOH Social Isolation: 3 to 5 times a week   Intimate Partner Violence: Low Risk  (10/21/2022)    Intimate Partner Violence     SDOH Domestic Violence: No   Housing Stability: Low Risk  (10/21/2022)    Housing Stability     SDOH Housing Situation: I have housing.     SDOH Housing Worry: No     REVIEW OF SYSTEMS:  Constitutional: negative for fevers, chills, sweats and fatigue  Eyes: negative for blurred vision or field defects  HEENT: negative for hearing loss  Respiratory: negative for cough and shortness of breath  Cardiovascular: negative for chest pain, negative for claudication  Gastrointestinal: negative for post prandial pain  Genitourinary: negative for dysuria, continues to urinate  Musculoskeletal: Per HPI  Neurological: negative for unilateral weakness or monocular blindness  Integumentary: negative for wounds or ulcers    PHYSICAL EXAM:  Vitals: BP (!) 150/65 (Site: Left, Patient Position: Sitting)   Pulse 66   Ht 1.753 m (5\' 9" )   Wt 113 kg (249 lb)   SpO2 90%   BMI 36.77 kg/m     General: AA&O X3. Nontoxic.  Well developed and well nourished in no acute distress   HENT: Head is normocephalic, atraumatic   Eyes: Pupils equal and round and reactive to light; conjunctiva clear. Sclera without icterus; EOMO grossly intact.    Neck: Normal ROM, Supple, symmetrical  Lungs: Effort normal,  clear to auscultation bilaterally.   Cardiovascular: Heart regular rate and rhythm, no murmur, rub or gallop  Abdomen: Bowel sounds normal; soft,  non distended non-tender to palpation, no rebound or guarding present. No palpable masses.  Extremities: extremities normal, atraumatic, no cyanosis or edema  Skin:  Skin warm and dry    Vascular:      Right carotid artery: 2+ (normal) Left carotid artery: 2+ (normal)   Right brachial artery: 2+ (normal) Left brachial artery: 2+ (normal)   Right radial artery:  2+ (normal) Left radial artery: 2+ (normal)   Right femoral artery: 2+ (normal) Left femoral artery: 2+ (normal)     DIAGNOSTIC STUDIES REVIEWED:  I have independently reviewed and interpreted available imaging.  My interpretations are summarized below.    I personally reviewed his CTA images from Apr 27, 2023.  The right internal carotid artery demonstrates a high-grade, 80-90% stenosis in the proximal segment of the vessel.  It does returned to normal beyond this.  The left internal carotid artery has a 60-70% stenosis.  The right vertebral artery is dominant and there is potential occlusion of the left vertebral artery.  The circle of Willis however is normal-appearing.    ASSESSMENT:  Mr. Reesor is a pleasant 62 year old male with multiple medical problems seen today for a high-grade, asymptomatic right carotid stenosis.  His CTA demonstrates a severe 80-90% stenosis of the proximal right internal carotid artery.    PLAN:  I had a long discussion with the patient today regarding carotid disease as it pertains to stroke risk and the indications for carotid revascularization. He has a severe stenosis of the right internal carotid artery that I have recommended undergo revascularization to minimize his risk of stroke.  We discussed both carotid endarterectomy and TCAR.  After discussing both procedures, he would like to move forward with a right TCAR. I discussed the risks and benefits of right trans carotid artery revascularization (TCAR) with the patient, including the specific risks of death, heart attack, stroke, bleeding, infection, nerve injury and the  need for further intervention. He expresses understanding and wishes to proceed.  Formal written consent was obtained today in the office.  I would like him to see Cardiology for perioperative risk stratification prior to this plan procedure. Will plan for carotid intervention to be performed at [facility] in the near future.    The patient was given the opportunity to ask questions and those questions were answered to their satisfaction. Instructed to call with any further questions or concerns.     Lillia Mountain., MD  [facility]

## 2023-05-26 ENCOUNTER — Other Ambulatory Visit: Payer: Self-pay

## 2023-05-26 ENCOUNTER — Inpatient Hospital Stay
Admission: RE | Admit: 2023-05-26 | Discharge: 2023-05-26 | Disposition: A | Discharge status: Home / Self Care | Payer: MEDICAID | Source: Ambulatory Visit | Attending: VASCULAR SURGERY | Admitting: VASCULAR SURGERY

## 2023-05-26 DIAGNOSIS — Z01810 Encounter for preprocedural cardiovascular examination: Secondary | ICD-10-CM | POA: Insufficient documentation

## 2023-06-03 ENCOUNTER — Ambulatory Visit: Payer: MEDICAID | Attending: Family | Admitting: Family

## 2023-06-03 ENCOUNTER — Encounter (INDEPENDENT_AMBULATORY_CARE_PROVIDER_SITE_OTHER): Payer: Self-pay | Admitting: Family

## 2023-06-03 ENCOUNTER — Other Ambulatory Visit: Payer: Self-pay

## 2023-06-03 VITALS — BP 132/65 | HR 71 | Ht 69.0 in | Wt 248.0 lb

## 2023-06-03 DIAGNOSIS — Z01810 Encounter for preprocedural cardiovascular examination: Secondary | ICD-10-CM | POA: Insufficient documentation

## 2023-06-03 DIAGNOSIS — I6529 Occlusion and stenosis of unspecified carotid artery: Secondary | ICD-10-CM | POA: Insufficient documentation

## 2023-06-03 DIAGNOSIS — Z79899 Other long term (current) drug therapy: Secondary | ICD-10-CM | POA: Insufficient documentation

## 2023-06-03 DIAGNOSIS — I6521 Occlusion and stenosis of right carotid artery: Secondary | ICD-10-CM | POA: Insufficient documentation

## 2023-06-03 DIAGNOSIS — I2510 Atherosclerotic heart disease of native coronary artery without angina pectoris: Secondary | ICD-10-CM | POA: Insufficient documentation

## 2023-06-03 DIAGNOSIS — F17210 Nicotine dependence, cigarettes, uncomplicated: Secondary | ICD-10-CM | POA: Insufficient documentation

## 2023-06-03 DIAGNOSIS — F17200 Nicotine dependence, unspecified, uncomplicated: Secondary | ICD-10-CM | POA: Insufficient documentation

## 2023-06-03 DIAGNOSIS — E785 Hyperlipidemia, unspecified: Secondary | ICD-10-CM | POA: Insufficient documentation

## 2023-06-03 LAB — ECG 12 LEAD - (AMB USE ONLY)(MUSE, IN CLINIC)
Atrial Rate: 65 {beats}/min
Calculated P Axis: 43 degrees
Calculated R Axis: 34 degrees
Calculated T Axis: 21 degrees
PR Interval: 132 ms
QRS Duration: 102 ms
QT Interval: 408 ms
QTC Calculation: 424 ms
Ventricular rate: 65 {beats}/min

## 2023-06-03 NOTE — Progress Notes (Signed)
CARDIOLOGY, [facility]  [address]  [place] [address]  Operated by [facility]  Progress Note       Name:  Hector [name]   DOB: 23-Mar-1961   MRN: [id number]   Date:  06/03/2023     PCP: Robin Searing, MD     Chief Complaint: Follow Up (Cardiac clearance /Echo 05/26/23)      History of Present Illness    Hector [name] is a 62 y.o. male who presents for preoperative cardiac evaluation prior to TCAR with Dr. Alger Memos.    Past medical history includes coronary artery calcifications on CT, carotid stenosis, hyperlipidemia, COPD, tobacco use, obesity.  He is a former alcoholic.  Transthoracic echocardiogram in June 2024 documented mild LVH, preserved EF, without significant valvular disease.    He reports chronic exertional dyspnea, unchanged.  Reports occasional lower extremity edema.  Reports occasional dizziness upon standing.  Denies chest discomfort, palpitations, syncope, orthopnea, PND.    Past Medical History:   Diagnosis Date    Alcohol abuse     Carotid stenosis     Chronic obstructive airway disease (CMS HCC)     Cirrhosis (CMS HCC)     Closed fracture of transverse process of thoracic vertebra (CMS HCC) 05/01/2021    Coma (CMS HCC)     after fall due to alcholism May 2022    Delirium, withdrawal, alcoholic (CMS HCC)     Depression     Encephalopathy 05/15/2021    HTN (hypertension)     Ileus (CMS HCC) 05/18/2021    Left pulmonary contusion 05/01/2021    Leukopenia 05/18/2021    Multiple rib fractures 05/01/2021    Pleural effusion 05/11/2021    Serum ammonia increased (CMS HCC) 05/15/2021    Spleen injury 05/01/2021    Status post thoracentesis 05/19/2021    Thrombocytopenia (CMS HCC) 05/01/2021    Unknown cause of injury     fx nose    Wears glasses      Patient Active Problem List    Diagnosis Date Noted    Carotid stenosis 06/03/2023    Coronary artery calcification seen on CT scan 06/03/2023    Mental disorder 09/25/2021    Dyslipidemia 09/25/2021    Wellness examination  09/25/2021    Chronic obstructive pulmonary disease (CMS HCC) 06/18/2021    Tobacco use disorder 05/29/2021    Cirrhosis of liver without ascites (CMS HCC) 05/01/2021    Alcohol abuse 05/01/2021      Past Surgical History:   Procedure Laterality Date    COLONOSCOPY  08/2021    ESOPHAGOGASTRODUODENOSCOPY  08/2021    HX TONSILLECTOMY       Current Outpatient Medications   Medication Sig    albuterol sulfate (VENTOLIN HFA) 90 mcg/actuation Inhalation oral inhaler Take 2 Puffs by inhalation Every 6 hours as needed for Other (SHORTNESS OF BREATH,COUGH OR WHEEZING)    fluticasone propion-salmeteroL (ADVAIR HFA) 230-21 mcg/actuation Inhalation oral inhaler Take 2 Puffs by inhalation Twice daily    ipratropium-albuterol 0.5 mg-3 mg(2.5 mg base)/3 mL Solution for Nebulization NEBULIZE 1 VIAL 3 TIMES A DAY (8 HOURS APART) AS NEEDED FOR COUGH, SHORTNESS OF BREATH, WHEEZING    OLANZapine (ZYPREXA) 10 mg Oral Tablet Take 1 Tablet (10 mg total) by mouth Every night    oxygen (O2) Inhalation gas Administer 4 L/min into affected nostril(s) continuous    pantoprazole (PROTONIX) 40 mg Oral Tablet, Delayed Release (E.C.) TAKE 1 TABLET (40 MG TOTAL) BY MOUTH  TWICE A DAY 30 MINTUTES BEFORE MEALS FOR 90 DAYS    rosuvastatin (CRESTOR) 5 mg Oral Tablet Take 2 Tablets (10 mg total) by mouth Every evening    sertraline (ZOLOFT) 100 mg Oral Tablet Take 2 Tablets (200 mg total) by mouth Once a day for 30 days    traZODone (DESYREL) 50 mg Oral Tablet Take 1 Tablet (50 mg total) by mouth Every night Unsure dosage      Allergies   Allergen Reactions    Bee Venom Protein (Honey Bee)      Bee stings     Family Medical History:       Problem Relation (Age of Onset)    Asthma Father    Cerebral Aneurysm Sister    Heart Attack Paternal Grandmother (80)          Social History     Socioeconomic History    Marital status: Divorced     Spouse name: Not on file    Number of children: Not on file    Years of education: Not on file    Highest education  level: Not on file   Occupational History    Not on file   Tobacco Use    Smoking status: Every Day     Current packs/day: 1.00     Average packs/day: 1 pack/day for 50.5 years (50.5 ttl pk-yrs)     Types: Cigarettes     Start date: 73    Smokeless tobacco: Never    Tobacco comments:     THE PATIENT STARTED    Vaping Use    Vaping status: Never Used   Substance and Sexual Activity    Alcohol use: Not Currently     Alcohol/week: 15.0 standard drinks of alcohol     Types: 15 Cans of beer per week     Comment:  may 17th, 2022     Drug use: Yes     Types: Marijuana     Comment:  a joint or 2 a day     Sexual activity: Yes   Other Topics Concern    Not on file   Social History Narrative    Not on file     Social Determinants of Health     Financial Resource Strain: Low Risk  (10/21/2022)    Financial Resource Strain     SDOH Financial: No   Transportation Needs: Low Risk  (10/21/2022)    Transportation Needs     SDOH Transportation: No   Social Connections: Medium Risk (10/21/2022)    Social Connections     SDOH Social Isolation: 3 to 5 times a week   Intimate Partner Violence: Low Risk  (10/21/2022)    Intimate Partner Violence     SDOH Domestic Violence: No   Housing Stability: Low Risk  (10/21/2022)    Housing Stability     SDOH Housing Situation: I have housing.     SDOH Housing Worry: No     Review of Systems    As per HPI; all other systems that were reviewed are negative.    Physical Exam    BP 132/65 (Site: Left, Patient Position: Sitting)   Pulse 71   Ht 1.753 m (5\' 9" )   Wt 112 kg (248 lb)   SpO2 92%   BMI 36.62 kg/m          General:  Appears well-developed, well-nourished, and well-groomed.  Appears stated age.  Obese.  No acute distress.  Head:  Normocephalic and atraumatic.  Eyes:  Sclera, conjunctiva, and lids normal bilaterally.  EOMI.  ENT:  No thyromegaly, trachea midline.  Cardiac:  PMI non-displaced, S1 and S2 audible, regular rate and rhythm.  No murmurs, rubs, or gallops.  Vascular:  No carotid  bruits, no JVD. No clubbing, cyanosis, varicosities, or edema.  Pulses equal and +2.  No abdominal aorta bruit.  Pulmonary:  Clear to auscultation bilaterally.  Respiratory effort non-labored, chest wall expansion symmetrical.  Abdomen:  Symmetric without distention.  Neurologic:  Sensation normal and gait steady.  No focal deficits noted.  Skin:  Pink, warm, dry, and intact.  Psychiatric:  Alert and oriented to person, place, time, and situation.  Mood and affect good.  Normal judgment.    Wt Readings from Last 3 Encounters:   06/03/23 112 kg (248 lb)   05/20/23 113 kg (249 lb)   04/21/23 113 kg (249 lb)     BP Readings from Last 3 Encounters:   06/03/23 132/65   05/20/23 (!) 150/65   04/21/23 (!) 142/72     Diagnostic Tests    03/2023 Carotid artery duplex  Bilateral: Right ICA: 70% - Less than near occlusion- Severe  Right ECA: Greater than 50% diameter reduction  Right vertebral: Normal, Antegrade Left ICA: Less than 50% stenosis-Mild  Left ECA: Less  than 50% diameter reduction  Left vertebral: Not well visualized.     Right: Heterogenous plaquing noted in the CCA, bifurcation, and extending into the ICA/ECA.  Left: Heterogenous plaquing noted in the CCA,bifurcation, and extending into the ICA/ECA.  Conclusions: Right ICA with > 70% but less than near occlusion- severe. No evidence of a hemodynamically significant stenosis in the left internal carotid artery. >50% diameter reduction noted in the right ECA. Left  external carotid artery without significant disease. The right vertebral is antegrade and patent.Left vertebral is not well visualized.   Critical Findings: Critical finding of >70% ICA stenosis reported to Dr.Urval on date 04/02/23. Readback performed.     04/2023 CTA intra-extra cranial w/ IV contrast  IMPRESSION:  There is greater than 70% stenosis of the right ICA origin by NASCET criteria.  There is 65-70% stenosis of the left ICA origin by NASCET criteria.  There is mild to moderate stenosis of  the proximal left subclavian artery.  There is moderate to severe stenosis at the origin of the right external carotid artery.  The left vertebral artery demonstrates opacification beginning at the C4 level with only patchy areas of opacification throughout the remainder of its course.  There is abrupt termination of a small branch of the right MCA as above with likely some collaterals in this location.  There is left-sided pleural thickening versus some loculated pleural fluid. Follow-up dedicated CT of the chest is recommended.    05/26/2023 TTE    Conclusions:  Normal left ventricular size.  Mild left ventricular hypertrophy.  Left ventricular systolic function is normal.  The left ventricular ejection fraction by visual assessment is estimated to be 55-60%.  Left ventricular diastolic parameters are normal.  Trace tricuspid regurgitation present.    06/03/2023 EKG  NSR    Lab Studies    CBC  Diff   Lab Results   Component Value Date/Time    WBC 4.9 04/13/2023 12:16 PM    HGB 14.1 04/13/2023 12:16 PM    HCT 40.8 04/13/2023 12:16 PM    PLTCNT 69 (L) 04/13/2023 12:16 PM    RBC 4.35 04/13/2023 12:16 PM  MCV 93.7 04/13/2023 12:16 PM    MCHC 34.7 04/13/2023 12:16 PM    MCH 32.5 04/13/2023 12:16 PM    RDW 15.6 (H) 04/13/2023 12:16 PM    MPV 9.2 04/13/2023 12:16 PM    Lab Results   Component Value Date/Time    PMNS 64 04/13/2023 12:16 PM    LYMPHOCYTES 24 04/13/2023 12:16 PM    EOSINOPHIL 2 04/13/2023 12:16 PM    MONOCYTES 9 04/13/2023 12:16 PM    BASOPHILS 1 04/13/2023 12:16 PM    BASOPHILS 0.00 04/13/2023 12:16 PM    PMNABS 3.10 04/13/2023 12:16 PM    LYMPHSABS 1.20 04/13/2023 12:16 PM    EOSABS 0.10 04/13/2023 12:16 PM    MONOSABS 0.50 04/13/2023 12:16 PM        Lab Results   Component Value Date    SODIUM 139 04/13/2023    POTASSIUM 4.2 04/13/2023    CHLORIDE 108 (H) 04/13/2023    CO2 23 04/13/2023    ANIONGAP 8 04/13/2023    BUN 14 04/13/2023    CREATININE 0.82 04/13/2023    GLUCOSENF 101 04/13/2023    CALCIUM  9.1 04/13/2023    PHOSPHORUS 4.2 05/24/2021    ALBUMIN 3.7 04/13/2023    TOTALPROTEIN 7.2 04/13/2023    ALKPHOS 144 (H) 04/13/2023    AST 49 04/13/2023    ALT 34 04/13/2023    BILIRUBINCON 0.0 05/13/2021     Lab Results   Component Value Date    MAGNESIUM 2.0 05/24/2021     Lab Results   Component Value Date    CHOLESTEROL 161 04/13/2023    HDLCHOL 32 (L) 04/13/2023    LDLCHOL 114 (H) 04/13/2023    TRIG 74 04/13/2023      Lab Results   Component Value Date    HA1C 5.3 05/02/2021     Lab Results   Component Value Date    TSH 4.130 05/02/2021     Orders     Orders Placed This Encounter    ECG (Clinic Performed)     Medications Discontinued During This Encounter   Medication Reason    nystatin (NYSTOP) 100,000 unit/gram Powder Patient states no longer taking     Assessment and Plan      ICD-10-CM    1. Pre-operative cardiovascular examination  Z01.810 ECG (Clinic Performed)      2. Coronary artery calcification seen on CT scan  I25.10       3. Carotid stenosis  I65.29       4. Dyslipidemia  E78.5       5. Tobacco use disorder  F17.200         Preoperative cardiac evaluation:  Patient is at acceptable risk from a cardiac standpoint to undergo TCAR.    Coronary artery calcification on CT:  CCS class 0 angina.  Continue statin and aspirin therapy.  Recommend risk factor reduction.    Carotid stenosis:  Medically managed with statin and aspirin therapy daily.  To undergo TCAR per vascular surgery.    Hyperlipidemia:  Medically managed with rosuvastatin 10 mg daily.  Target LDL is less than 55.      Tobacco use:  Recommend cessation.      Follow Up Instructions    Return to the clinic as needed.      30 minutes were spent in direct care of this patient including evaluation, review of laboratory, radiology, and diagnostic studies, review of medical record, order entry, coordination of care, and counseling/education.  Karolee Stamps, APRN, FNP-C  [facility] Advanced Practice Professional      A  portion of this documentation may have been generated using [facility] voice recognition software and may contain syntax/voice recognition errors.

## 2023-06-08 ENCOUNTER — Encounter (INDEPENDENT_AMBULATORY_CARE_PROVIDER_SITE_OTHER): Payer: Self-pay

## 2023-06-08 ENCOUNTER — Other Ambulatory Visit (INDEPENDENT_AMBULATORY_CARE_PROVIDER_SITE_OTHER): Payer: Self-pay | Admitting: Internal Medicine

## 2023-06-08 ENCOUNTER — Telehealth (INDEPENDENT_AMBULATORY_CARE_PROVIDER_SITE_OTHER): Payer: Self-pay | Admitting: Internal Medicine

## 2023-06-08 ENCOUNTER — Telehealth (INDEPENDENT_AMBULATORY_CARE_PROVIDER_SITE_OTHER): Payer: Self-pay

## 2023-06-08 DIAGNOSIS — E785 Hyperlipidemia, unspecified: Secondary | ICD-10-CM

## 2023-06-08 MED ORDER — NICOTINE 14 MG/24 HR DAILY TRANSDERMAL PATCH
14.0000 mg | MEDICATED_PATCH | Freq: Every day | TRANSDERMAL | 0 refills | Status: DC
Start: 2023-06-08 — End: 2023-06-16

## 2023-06-08 MED ORDER — ROSUVASTATIN 5 MG TABLET
10.0000 mg | ORAL_TABLET | Freq: Every evening | ORAL | 1 refills | Status: DC
Start: 2023-06-08 — End: 2023-07-29

## 2023-06-08 NOTE — Telephone Encounter (Addendum)
Hector [name]  is scheduled at [facility], [address], [place], [place] [address] for a TCAR on 06/29/23.  Your procedure is scheduled at 0930.  Please arrive to [facility] surgical center by 0730.  Your pre-op appointment is scheduled for 06/22/23, please arrive to [facility], [facility] [address], by 0945. You may receive a call from hospital staff the day before your scheduled appointment with additional instructions.  Your follow up appointment is scheduled for 07/29/23 at 1400 at the [facility] office.  Do not eat or drink anything for 8 hours prior to your procedure.  Take all medications with sips of water.  You must have a ride home.  You are not permitted to drive after your procedure.  If you have any questions regarding your procedure or need to change / cancel, please call [facility] at [phone].  [name] verbalized understanding and had no questions at this time.   Unknown Foley, RN  06/08/2023 11:02    [name] has been notified as well.   06/08/2023 10:22

## 2023-06-08 NOTE — Telephone Encounter (Signed)
Patient is requesting script for the "Step 2 stop smoking patch."    [facility] [address]

## 2023-06-09 ENCOUNTER — Telehealth (INDEPENDENT_AMBULATORY_CARE_PROVIDER_SITE_OTHER): Payer: Self-pay | Admitting: VASCULAR SURGERY

## 2023-06-09 NOTE — Telephone Encounter (Signed)
-----   Message from Christeen Douglas sent at 06/09/2023 10:40 AM EDT -----  Regarding: RE: Berkley Harvey  Auth# [id number]  6/25- 09/09/23 [place]  ----- Message -----  From: Marian Sorrow  Sent: 06/04/2023   2:41 PM EDT  To: Christeen Douglas  Subject: Auth                                             Could you please check on auth for this procedure?    CPT [phone]  Dx [phone]  [facility]  Dr. Alger Memos  DOSA  06/29/23    Thank you!    ----- Message -----  From: Unknown Foley, RN  Sent: 05/20/2023   9:46 AM EDT  To: Diona Browner Leiffer  Subject: schedule                                         R TCAR  [place]  [place]     Cardiac referral in for clearance    Note to me: Notify Cherre Huger

## 2023-06-16 ENCOUNTER — Telehealth (INDEPENDENT_AMBULATORY_CARE_PROVIDER_SITE_OTHER): Payer: Self-pay | Admitting: Nurse Practitioner

## 2023-06-16 DIAGNOSIS — F17210 Nicotine dependence, cigarettes, uncomplicated: Secondary | ICD-10-CM

## 2023-06-16 MED ORDER — NICOTINE 7 MG/24 HR DAILY TRANSDERMAL PATCH
7.0000 mg | MEDICATED_PATCH | Freq: Every day | TRANSDERMAL | 0 refills | Status: DC
Start: 2023-06-16 — End: 2023-06-30

## 2023-06-16 MED ORDER — NICOTINE 21 MG/24 HR DAILY TRANSDERMAL PATCH
21.0000 mg | MEDICATED_PATCH | Freq: Every day | TRANSDERMAL | 0 refills | Status: DC
Start: 2023-06-16 — End: 2023-06-30

## 2023-06-16 MED ORDER — NICOTINE 14 MG/24 HR DAILY TRANSDERMAL PATCH
14.0000 mg | MEDICATED_PATCH | Freq: Every day | TRANSDERMAL | 0 refills | Status: DC
Start: 2023-06-16 — End: 2023-06-30

## 2023-06-16 NOTE — Telephone Encounter (Signed)
The patient called requesting a refill on Nicoderm patches.    [facility] [address] [facility] is patients pharmacy.    Last appt. 04/21/23  Next appt. 10/28/23      Domingo Pulse, LPN

## 2023-06-16 NOTE — Telephone Encounter (Signed)
TINE DAILY PATCH 21  MG FOR ONE MONTH THEN 14 MG FOR 2 WEEKS THEN 7 MG FOR 2 WEEKS ESCRIBED TO THE PATIENT'S PHARMACY

## 2023-06-22 ENCOUNTER — Other Ambulatory Visit: Payer: Self-pay | Admitting: Internal Medicine

## 2023-06-22 ENCOUNTER — Inpatient Hospital Stay
Admission: RE | Admit: 2023-06-22 | Discharge: 2023-06-22 | Disposition: A | Discharge status: Home / Self Care | Payer: MEDICAID | Source: Ambulatory Visit | Attending: VASCULAR SURGERY | Admitting: VASCULAR SURGERY

## 2023-06-22 ENCOUNTER — Other Ambulatory Visit: Payer: Self-pay

## 2023-06-22 ENCOUNTER — Encounter (HOSPITAL_COMMUNITY): Payer: Self-pay

## 2023-06-22 ENCOUNTER — Other Ambulatory Visit (INDEPENDENT_AMBULATORY_CARE_PROVIDER_SITE_OTHER): Payer: Self-pay

## 2023-06-22 VITALS — BP 126/66 | HR 72 | Temp 96.6°F | Resp 18 | Ht 68.0 in | Wt 247.1 lb

## 2023-06-22 DIAGNOSIS — I6523 Occlusion and stenosis of bilateral carotid arteries: Secondary | ICD-10-CM

## 2023-06-22 DIAGNOSIS — Z01818 Encounter for other preprocedural examination: Secondary | ICD-10-CM | POA: Insufficient documentation

## 2023-06-22 HISTORY — DX: Gastro-esophageal reflux disease without esophagitis: K21.9

## 2023-06-22 HISTORY — DX: Hyperlipidemia, unspecified: E78.5

## 2023-06-22 HISTORY — DX: Dependence on supplemental oxygen: Z99.81

## 2023-06-22 HISTORY — DX: Shortness of breath: R06.02

## 2023-06-22 LAB — CBC
HCT: 40.4 % (ref 36.0–46.0)
HGB: 13.8 g/dL — ABNORMAL LOW (ref 13.9–16.3)
MCH: 31.5 pg (ref 25.4–34.0)
MCHC: 34.1 g/dL (ref 30.0–37.0)
MCV: 92.6 fL (ref 80.0–100.0)
MPV: 9.3 fL (ref 7.5–11.5)
PLATELETS: 72 10*3/uL — ABNORMAL LOW (ref 130–400)
RBC: 4.36 10*6/uL (ref 4.30–5.90)
RDW: 15.5 % — ABNORMAL HIGH (ref 11.5–14.0)
WBC: 4.3 10*3/uL — ABNORMAL LOW (ref 4.5–11.5)

## 2023-06-22 LAB — BASIC METABOLIC PANEL
ANION GAP: 9 mmol/L (ref 5–19)
BUN/CREA RATIO: 10 (ref 6–20)
BUN: 10 mg/dL (ref 9–20)
CALCIUM: 9.2 mg/dL (ref 8.4–10.2)
CHLORIDE: 107 mmol/L (ref 98–107)
CO2 TOTAL: 24 mmol/L (ref 22–30)
CREATININE: 1.03 mg/dL (ref 0.66–1.20)
ESTIMATED GFR: >60 mL/min/{1.73_m2} (ref >60)
GLUCOSE: 89 mg/dL (ref 74–106)
POTASSIUM: 4.6 mmol/L (ref 3.5–5.1)
SODIUM: 140 mmol/L (ref 137–145)

## 2023-06-22 LAB — PT/INR
INR: 1.16 (ref 0.87–1.18)
PROTHROMBIN TIME: 11.8 seconds (ref 9.0–12.0)

## 2023-06-22 LAB — TYPE AND SCREEN
ABO/RH(D): A POS
ANTIBODY SCREEN: NEGATIVE

## 2023-06-22 MED ORDER — CLOPIDOGREL 75 MG TABLET
75.0000 mg | ORAL_TABLET | Freq: Every day | ORAL | 1 refills | Status: DC
Start: 2023-06-22 — End: 2023-12-17

## 2023-06-22 NOTE — Anesthesia Preprocedure Evaluation (Addendum)
ANESTHESIA PRE-OP EVALUATION  Planned Procedure: TCAR - IMAGING ONLY (Right)  Review of Systems     anesthesia history negative     patient summary reviewed  nursing notes reviewed        Pulmonary   COPD, shortness of breath, home oxygen, current smoker and Smoked in last 24 hours,   Cardiovascular    Hypertension, valvular problems/murmurs, murmur, CAD, hyperlipidemia and tricuspid regurgitation ,       GI/Hepatic/Renal    Dysphagia  etoh abuse-quit drinking may 2022, GERD and liver disease (ascites- never drained)        Endo/Other    obesity,      Neuro/Psych/MS    depression, Substance use, alcohol     Cancer    negative hematology/oncology ROS,                     Physical Assessment      Airway       Mallampati: III    TM distance: >3 FB    Neck ROM: full  Mouth Opening: good.            Dental           (+) poor dentition, chipped           Pulmonary    Breath sounds clear to auscultation  (-) no rhonchi, no decreased breath sounds, no wheezes, no rales and no stridor     Cardiovascular        (+) murmur present        Other findings              Plan  ASA 4     Planned anesthesia type: general     general anesthesia with endotracheal tube intubation                Additional Plans: Arterial line    Intravenous induction       Anesthetic plan and risks discussed with patient  signed consent obtained                     (Advised pt and spouse that pt may require post op ventilation with a breathing tube due to O2-dependant COPD.  )

## 2023-06-23 NOTE — OR PreOp (Addendum)
06/23/23 CBC  REVIEWED  BY  ANESTHESIA  DR Lavonna Monarch  STATES   TO NOTIFY  PRIMARY   TO SEE  IF  THEY ARE WORKING  PT  UP FOR  THROMBOCYTOPENIA. DR Erenest Rasher  NOTIFIED TO REVIEW LABS  IN EPIC  AND ASK IF  PT  BEING  WORKED  UP. DR Gardenia Phlegm STATES  PT HAS HX  OF CIRRHOSIS  OF LIVER  AND  CHRONIC THROMBOCYTOPENIA  R/T IT .   SECURE CHAT  SENT TO DR Alger Memos TO PLEASE REVIEW  LABS  , PLT COUNT IN EPIC , THAT PT  HAS HX  CIRRHOSIS  OF  LIVER AND  CHRONIC  THROMBOCYTOPENIA  BECAUSE OF  His LIVER DISEASE . MESSAGE SENT  TO SEE IF  He  FEELS  ANY FURTHER TREATMENT  NEEDED  PRIOR TO  PT  HAVING TCAR  R/T  LOW PLT COUNT .   LABS  REVIEWED  , NO FURTHER ORDERS  GIVEN

## 2023-06-24 ENCOUNTER — Telehealth (INDEPENDENT_AMBULATORY_CARE_PROVIDER_SITE_OTHER): Payer: Self-pay | Admitting: Nurse Practitioner

## 2023-06-24 NOTE — Telephone Encounter (Signed)
Patient advised prior Berkley Harvey was done on Nicotine patches and this was denied. Insurance is asking the patient to enroll in a tobacco cessation coaching program before can be approved. Advised the patient he can call 1-800-QUIT-NOW [phone] 780-2889) or he can choose to pay out of pocket for the RX. Patients states its about $47 dollars a box. Advised he should also receive a letter in the mail from his insurance company.    Hector Natal Mattthew Ziomek, LPN

## 2023-06-29 ENCOUNTER — Ambulatory Visit: Payer: 59 | Admitting: Diagnostic Neuroimaging

## 2023-06-29 ENCOUNTER — Other Ambulatory Visit: Payer: Self-pay

## 2023-06-29 ENCOUNTER — Encounter (HOSPITAL_COMMUNITY)
Admission: RE | Disposition: A | Discharge status: Home / Self Care | Payer: Self-pay | Source: Ambulatory Visit | Attending: VASCULAR SURGERY

## 2023-06-29 ENCOUNTER — Inpatient Hospital Stay (HOSPITAL_COMMUNITY): Payer: MEDICAID | Admitting: Anesthesiology

## 2023-06-29 ENCOUNTER — Inpatient Hospital Stay
Admission: RE | Admit: 2023-06-29 | Discharge: 2023-06-30 | DRG: 036 | Disposition: A | Discharge status: Home / Self Care | Payer: MEDICAID | Source: Ambulatory Visit | Attending: VASCULAR SURGERY | Admitting: VASCULAR SURGERY

## 2023-06-29 ENCOUNTER — Encounter (HOSPITAL_COMMUNITY): Payer: Self-pay | Admitting: VASCULAR SURGERY

## 2023-06-29 ENCOUNTER — Observation Stay (HOSPITAL_COMMUNITY): Payer: MEDICAID | Admitting: VASCULAR SURGERY

## 2023-06-29 DIAGNOSIS — Z006 Encounter for examination for normal comparison and control in clinical research program: Secondary | ICD-10-CM

## 2023-06-29 DIAGNOSIS — Z6837 Body mass index (BMI) 37.0-37.9, adult: Secondary | ICD-10-CM

## 2023-06-29 DIAGNOSIS — I10 Essential (primary) hypertension: Secondary | ICD-10-CM | POA: Diagnosis present

## 2023-06-29 DIAGNOSIS — Z9981 Dependence on supplemental oxygen: Secondary | ICD-10-CM

## 2023-06-29 DIAGNOSIS — K7460 Unspecified cirrhosis of liver: Secondary | ICD-10-CM | POA: Diagnosis present

## 2023-06-29 DIAGNOSIS — Z7982 Long term (current) use of aspirin: Secondary | ICD-10-CM

## 2023-06-29 DIAGNOSIS — Z6836 Body mass index (BMI) 36.0-36.9, adult: Secondary | ICD-10-CM

## 2023-06-29 DIAGNOSIS — F32A Depression, unspecified: Secondary | ICD-10-CM | POA: Diagnosis present

## 2023-06-29 DIAGNOSIS — R011 Cardiac murmur, unspecified: Secondary | ICD-10-CM | POA: Diagnosis present

## 2023-06-29 DIAGNOSIS — F1011 Alcohol abuse, in remission: Secondary | ICD-10-CM | POA: Diagnosis present

## 2023-06-29 DIAGNOSIS — I2510 Atherosclerotic heart disease of native coronary artery without angina pectoris: Secondary | ICD-10-CM | POA: Diagnosis present

## 2023-06-29 DIAGNOSIS — R1310 Dysphagia, unspecified: Secondary | ICD-10-CM | POA: Diagnosis present

## 2023-06-29 DIAGNOSIS — E669 Obesity, unspecified: Secondary | ICD-10-CM | POA: Diagnosis present

## 2023-06-29 DIAGNOSIS — F17210 Nicotine dependence, cigarettes, uncomplicated: Secondary | ICD-10-CM | POA: Diagnosis present

## 2023-06-29 DIAGNOSIS — Z7951 Long term (current) use of inhaled steroids: Secondary | ICD-10-CM

## 2023-06-29 DIAGNOSIS — K219 Gastro-esophageal reflux disease without esophagitis: Secondary | ICD-10-CM | POA: Diagnosis present

## 2023-06-29 DIAGNOSIS — I071 Rheumatic tricuspid insufficiency: Secondary | ICD-10-CM | POA: Diagnosis present

## 2023-06-29 DIAGNOSIS — Z604 Social exclusion and rejection: Secondary | ICD-10-CM | POA: Diagnosis present

## 2023-06-29 DIAGNOSIS — J449 Chronic obstructive pulmonary disease, unspecified: Secondary | ICD-10-CM | POA: Diagnosis present

## 2023-06-29 DIAGNOSIS — I6521 Occlusion and stenosis of right carotid artery: Principal | ICD-10-CM

## 2023-06-29 DIAGNOSIS — Z7902 Long term (current) use of antithrombotics/antiplatelets: Secondary | ICD-10-CM

## 2023-06-29 DIAGNOSIS — Z79899 Other long term (current) drug therapy: Secondary | ICD-10-CM

## 2023-06-29 DIAGNOSIS — R0602 Shortness of breath: Secondary | ICD-10-CM | POA: Diagnosis present

## 2023-06-29 DIAGNOSIS — E785 Hyperlipidemia, unspecified: Secondary | ICD-10-CM | POA: Diagnosis present

## 2023-06-29 HISTORY — PX: HX OTHER: 2100001105

## 2023-06-29 LAB — POC ACT PLUS (RESULTS): ACT PLUS POC: 368 seconds — ABNORMAL HIGH (ref 96–152)

## 2023-06-29 SURGERY — TCAR - IMAGING ONLY
Anesthesia: General | Laterality: Right | Wound class: N/A - Imaging Only

## 2023-06-29 MED ORDER — PHENYLEPHRINE 10 MG/ML INJECTION SOLUTION
INTRAMUSCULAR | Status: DC | PRN
Start: 2023-06-29 — End: 2023-06-29
  Administered 2023-06-29: 0 ug/kg/min via INTRAVENOUS
  Administered 2023-06-29: .2 ug/kg/min via INTRAVENOUS
  Administered 2023-06-29: .1 ug/kg/min via INTRAVENOUS

## 2023-06-29 MED ORDER — CEFAZOLIN 2 GRAM/100 ML IN DEXTROSE(ISO-OSMOTIC) INTRAVENOUS PIGGYBACK
2.0000 g | INJECTION | Freq: Three times a day (TID) | INTRAVENOUS | Status: AC
Start: 2023-06-29 — End: 2023-06-30
  Administered 2023-06-29: 2 g via INTRAVENOUS
  Administered 2023-06-29: 0 g via INTRAVENOUS
  Administered 2023-06-30: 2 g via INTRAVENOUS
  Administered 2023-06-30 (×2): 0 g via INTRAVENOUS
  Administered 2023-06-30: 2 g via INTRAVENOUS
  Filled 2023-06-29 (×5): qty 100

## 2023-06-29 MED ORDER — IPRATROPIUM 0.5 MG-ALBUTEROL 3 MG (2.5 MG BASE)/3 ML NEBULIZATION SOLN
3.0000 mL | INHALATION_SOLUTION | Freq: Two times a day (BID) | RESPIRATORY_TRACT | Status: DC
Start: 2023-06-29 — End: 2023-06-30
  Administered 2023-06-29 – 2023-06-30 (×2): 3 mL via RESPIRATORY_TRACT
  Filled 2023-06-29 (×2): qty 3

## 2023-06-29 MED ORDER — ASPIRIN 81 MG TABLET,DELAYED RELEASE
81.0000 mg | DELAYED_RELEASE_TABLET | Freq: Every day | ORAL | Status: DC
Start: 2023-06-29 — End: 2023-06-30
  Administered 2023-06-29 – 2023-06-30 (×2): 81 mg via ORAL
  Filled 2023-06-29 (×2): qty 1

## 2023-06-29 MED ORDER — IOPAMIDOL 300 MG IODINE/ML (61 %) INTRAVENOUS SOLUTION
Freq: Once | INTRAVENOUS | Status: DC | PRN
Start: 2023-06-29 — End: 2023-06-29
  Administered 2023-06-29: 20 mL

## 2023-06-29 MED ORDER — LACTATED RINGERS INTRAVENOUS SOLUTION
INTRAVENOUS | Status: DC
Start: 2023-06-29 — End: 2023-06-29

## 2023-06-29 MED ORDER — ONDANSETRON HCL (PF) 4 MG/2 ML INJECTION SOLUTION
Freq: Once | INTRAMUSCULAR | Status: DC | PRN
Start: 2023-06-29 — End: 2023-06-29
  Administered 2023-06-29: 4 mg via INTRAVENOUS

## 2023-06-29 MED ORDER — PHENYLEPHRINE 10 MG/ML INJECTION SOLUTION
Freq: Once | INTRAMUSCULAR | Status: DC | PRN
Start: 2023-06-29 — End: 2023-06-29
  Administered 2023-06-29 (×2): 100 ug via INTRAVENOUS

## 2023-06-29 MED ORDER — DROPERIDOL 2.5 MG/ML INJECTION SOLUTION
0.6250 mg | Freq: Once | INTRAMUSCULAR | Status: DC | PRN
Start: 2023-06-29 — End: 2023-06-29

## 2023-06-29 MED ORDER — PROTAMINE 10 MG/ML INTRAVENOUS SOLUTION
Freq: Once | INTRAVENOUS | Status: DC | PRN
Start: 2023-06-29 — End: 2023-06-29
  Administered 2023-06-29: 40 mg via INTRAVENOUS

## 2023-06-29 MED ORDER — PANTOPRAZOLE 40 MG TABLET,DELAYED RELEASE
40.0000 mg | DELAYED_RELEASE_TABLET | Freq: Every evening | ORAL | Status: DC
Start: 2023-06-29 — End: 2023-06-30
  Administered 2023-06-29: 40 mg via ORAL
  Filled 2023-06-29: qty 1

## 2023-06-29 MED ORDER — IPRATROPIUM 0.5 MG-ALBUTEROL 3 MG (2.5 MG BASE)/3 ML NEBULIZATION SOLN
3.0000 mL | INHALATION_SOLUTION | Freq: Once | RESPIRATORY_TRACT | Status: DC
Start: 2023-06-29 — End: 2023-06-29

## 2023-06-29 MED ORDER — CEFAZOLIN 2 GRAM/100 ML IN DEXTROSE(ISO-OSMOTIC) INTRAVENOUS PIGGYBACK
2.0000 g | INJECTION | Freq: Once | INTRAVENOUS | Status: AC
Start: 2023-06-29 — End: 2023-06-29
  Administered 2023-06-29: 2 g via INTRAVENOUS
  Filled 2023-06-29: qty 100

## 2023-06-29 MED ORDER — ALBUTEROL SULFATE HFA 90 MCG/ACTUATION AEROSOL INHALER
2.0000 | INHALATION_SPRAY | Freq: Four times a day (QID) | RESPIRATORY_TRACT | Status: DC | PRN
Start: 2023-06-29 — End: 2023-06-30
  Filled 2023-06-29: qty 6.7

## 2023-06-29 MED ORDER — SERTRALINE 100 MG TABLET
200.0000 mg | ORAL_TABLET | Freq: Every day | ORAL | Status: DC
Start: 2023-06-29 — End: 2023-06-30
  Administered 2023-06-29 – 2023-06-30 (×2): 200 mg via ORAL
  Filled 2023-06-29 (×2): qty 2

## 2023-06-29 MED ORDER — ROCURONIUM 10 MG/ML INTRAVENOUS SYRINGE WRAPPER
INJECTION | Freq: Once | INTRAVENOUS | Status: DC | PRN
Start: 2023-06-29 — End: 2023-06-29
  Administered 2023-06-29: 50 mg via INTRAVENOUS
  Administered 2023-06-29 (×2): 20 mg via INTRAVENOUS

## 2023-06-29 MED ORDER — SUGAMMADEX 100 MG/ML INTRAVENOUS SOLUTION
Freq: Once | INTRAVENOUS | Status: DC | PRN
Start: 2023-06-29 — End: 2023-06-29
  Administered 2023-06-29: 400 mg via INTRAVENOUS

## 2023-06-29 MED ORDER — ROSUVASTATIN 10 MG TABLET
10.0000 mg | ORAL_TABLET | ORAL | Status: AC
Start: 2023-06-29 — End: 2023-06-29
  Administered 2023-06-29: 10 mg via ORAL
  Filled 2023-06-29: qty 1

## 2023-06-29 MED ORDER — LIDOCAINE (PF) 100 MG/5 ML (2 %) INTRAVENOUS SYRINGE
INJECTION | Freq: Once | INTRAVENOUS | Status: DC | PRN
Start: 2023-06-29 — End: 2023-06-29
  Administered 2023-06-29: 100 mg via INTRAVENOUS

## 2023-06-29 MED ORDER — HEPARIN (PORCINE) 1,000 UNIT/ML INJECTION SOLUTION
Freq: Once | INTRAMUSCULAR | Status: DC | PRN
Start: 2023-06-29 — End: 2023-06-29
  Administered 2023-06-29: 14000 [IU] via INTRAVENOUS

## 2023-06-29 MED ORDER — SODIUM CHLORIDE 0.9 % (FLUSH) INJECTION SYRINGE
10.0000 mL | INJECTION | INTRAMUSCULAR | Status: DC | PRN
Start: 2023-06-29 — End: 2023-06-30

## 2023-06-29 MED ORDER — OLANZAPINE 10 MG TABLET
10.0000 mg | ORAL_TABLET | Freq: Every evening | ORAL | Status: DC
Start: 2023-06-29 — End: 2023-06-30
  Administered 2023-06-29: 10 mg via ORAL
  Filled 2023-06-29: qty 1

## 2023-06-29 MED ORDER — ROSUVASTATIN 10 MG TABLET
10.0000 mg | ORAL_TABLET | Freq: Every evening | ORAL | Status: DC
Start: 2023-06-29 — End: 2023-06-30
  Administered 2023-06-29: 10 mg via ORAL
  Filled 2023-06-29: qty 1

## 2023-06-29 MED ORDER — HYDROMORPHONE (PF) 0.5 MG/0.5 ML INJECTION SYRINGE
0.5000 mg | INJECTION | INTRAMUSCULAR | Status: DC | PRN
Start: 2023-06-29 — End: 2023-06-29

## 2023-06-29 MED ORDER — CLOPIDOGREL 75 MG TABLET
75.0000 mg | ORAL_TABLET | Freq: Every day | ORAL | Status: DC
Start: 2023-06-30 — End: 2023-06-30
  Administered 2023-06-30: 75 mg via ORAL
  Filled 2023-06-29: qty 1

## 2023-06-29 MED ORDER — MEPERIDINE (PF) 25 MG/ML INJECTION SYRINGE
12.5000 mg | INJECTION | INTRAMUSCULAR | Status: DC | PRN
Start: 2023-06-29 — End: 2023-06-29

## 2023-06-29 MED ORDER — OXYCODONE-ACETAMINOPHEN 5 MG-325 MG TABLET
1.0000 | ORAL_TABLET | Freq: Four times a day (QID) | ORAL | Status: DC | PRN
Start: 2023-06-29 — End: 2023-06-30
  Administered 2023-06-29: 1 via ORAL
  Filled 2023-06-29: qty 1

## 2023-06-29 MED ORDER — SODIUM CHLORIDE 0.9 % (FLUSH) INJECTION SYRINGE
10.0000 mL | INJECTION | Freq: Three times a day (TID) | INTRAMUSCULAR | Status: DC
Start: 2023-06-29 — End: 2023-06-29

## 2023-06-29 MED ORDER — SODIUM CHLORIDE 0.9 % (FLUSH) INJECTION SYRINGE
10.0000 mL | INJECTION | INTRAMUSCULAR | Status: DC | PRN
Start: 2023-06-29 — End: 2023-06-29

## 2023-06-29 MED ORDER — FENTANYL (PF) 50 MCG/ML INJECTION SOLUTION
Freq: Once | INTRAMUSCULAR | Status: DC | PRN
Start: 2023-06-29 — End: 2023-06-29
  Administered 2023-06-29: 100 ug via INTRAVENOUS

## 2023-06-29 MED ORDER — SODIUM CHLORIDE 0.9 % (FLUSH) INJECTION SYRINGE
10.0000 mL | INJECTION | Freq: Three times a day (TID) | INTRAMUSCULAR | Status: DC
Start: 2023-06-29 — End: 2023-06-30
  Administered 2023-06-29: 0 mL
  Administered 2023-06-29: 10 mL
  Administered 2023-06-30 (×2): 0 mL

## 2023-06-29 MED ORDER — PROPOFOL 10 MG/ML IV BOLUS
INJECTION | Freq: Once | INTRAVENOUS | Status: DC | PRN
Start: 2023-06-29 — End: 2023-06-29
  Administered 2023-06-29: 200 mg via INTRAVENOUS

## 2023-06-29 MED ORDER — MAGNESIUM HYDROXIDE 400 MG/5 ML ORAL SUSPENSION
30.0000 mL | Freq: Every evening | ORAL | Status: DC | PRN
Start: 2023-06-29 — End: 2023-06-30

## 2023-06-29 MED ORDER — GLYCOPYRROLATE 0.2 MG/ML INJECTION SOLUTION
Freq: Once | INTRAMUSCULAR | Status: DC | PRN
Start: 2023-06-29 — End: 2023-06-29
  Administered 2023-06-29: .2 mg via INTRAVENOUS

## 2023-06-29 MED ORDER — EPHEDRINE SULFATE 50 MG/ML INTRAVENOUS SOLUTION
Freq: Once | INTRAVENOUS | Status: DC | PRN
Start: 2023-06-29 — End: 2023-06-29
  Administered 2023-06-29 (×3): 5 mg via INTRAVENOUS

## 2023-06-29 MED ORDER — MORPHINE 2 MG/ML INJECTION WRAPPER
2.0000 mg | INJECTION | INTRAMUSCULAR | Status: DC | PRN
Start: 2023-06-29 — End: 2023-06-30

## 2023-06-29 MED ORDER — MOMETASONE-FORMOTEROL HFA 100 MCG-5 MCG/ACTUATION AEROSOL INHALER
2.0000 | INHALATION_SPRAY | Freq: Two times a day (BID) | RESPIRATORY_TRACT | Status: DC
Start: 2023-06-29 — End: 2023-06-30
  Administered 2023-06-29 – 2023-06-30 (×3): 0 via RESPIRATORY_TRACT
  Filled 2023-06-29: qty 8.8

## 2023-06-29 MED ORDER — IPRATROPIUM 0.5 MG-ALBUTEROL 3 MG (2.5 MG BASE)/3 ML NEBULIZATION SOLN
3.0000 mL | INHALATION_SOLUTION | RESPIRATORY_TRACT | Status: AC
Start: 2023-06-29 — End: 2023-06-29
  Administered 2023-06-29: 3 mL via RESPIRATORY_TRACT
  Filled 2023-06-29: qty 3

## 2023-06-29 MED ORDER — HYDRALAZINE 20 MG/ML INJECTION SOLUTION
5.0000 mg | Freq: Once | INTRAMUSCULAR | Status: DC | PRN
Start: 2023-06-29 — End: 2023-06-29

## 2023-06-29 MED ORDER — SODIUM CHLORIDE 0.9 % INTRAVENOUS SOLUTION
INTRAVENOUS | Status: DC
Start: 2023-06-29 — End: 2023-06-30

## 2023-06-29 SURGICAL SUPPLY — 30 items
AGENT HMST MATRIX RECOTHROM FLSL (WOUND CARE SUPPLY) ×1 IMPLANT
APPLIER PREM SRGCLP III 9IN 20 CLIP TI SM INTERNAL CLIP DISP (WOUND CARE SUPPLY) ×1 IMPLANT
APPLIER PREM SRGCLP SUP INTLK 11.5IN 30 ATO CLIP LGT ERG HNDL TAB RATCHET CLSR TI MED INTERNAL CLIP (WOUND CARE SUPPLY) ×1 IMPLANT
CATH ENROUTE ENFLATE 5MM 25MM 75CM SHORT LGTH RADOPQ RX LOW PROF BAL DIL TRANSCAROTID NYL ACPT .014 (INTRODUCER) ×1 IMPLANT
CONV USE 337925 - PACK SURG SPCL STRL DISP LF (CUSTOM TRAYS & PACK) ×1
COVER CLAMP RD 130X2.5MM SM SIL RADOPQ ATRAUMA FLXB STRL LF  DISP (MED SURG SUPPLIES) ×1 IMPLANT
COVER FLXB STRL LT GRD LIGHT HNDL PLASTIC (MED SURG SUPPLIES) ×1 IMPLANT
COVER PROBE CIV-CLR 48IBX5.5IN 3 DIMENSION TELESCOPICAL FOLD STRL LF (MED SURG SUPPLIES) ×1 IMPLANT
DEVICE INFLAT BASIXCOMPAK 13IN ANLG LMSCNT DSPL PLYCRB 30 ATM CLR 20ML (ENDOSCOPIC SUPPLIES) ×1 IMPLANT
DISCONTINUED USE 330076 - ADH SKNCLS EXOFIN PREMIERPRO MICRO HVSC SFT FLXB APPL TUBE STRL TISS LF  DISP .5ML (SUTURE AIDS) ×1 IMPLANT
DRAPE THR DIAMOND FENESTRATE TUBE HLDR PAD 124X76IN STRL SURG CONTROL + SMS 5X5IN (DRAPE/PACKS/SHEETS/OR TOWEL) ×1 IMPLANT
ELECTRODE ESURG BLADE PNCL 10FT EDGE STRL PVC DISP BUTTON SWH CORD HLSTR LF  ACPT 3/32IN STD SHAFT (SURGICAL CUTTING SUPPLIES) ×1 IMPLANT
ELECTRODE PATIENT RTN 9FT VLAB C30- LB RM PHSV ACRL FOAM CORD NONIRRITATE NONSENSITIZE ADH STRP (SURGICAL CUTTING SUPPLIES) ×1 IMPLANT
GW .035IN 15CM 145CM FLX TP SS PTFE VAS BNTSN TAPER (WIRE) ×1 IMPLANT
GW ENROUTE .014IN 95CM ERG CONTROL VAS (WIRE) ×1 IMPLANT
KIT INTROD 50CM 4FR SILK ROAD .018IN MICROWIRE EXT TUBE NEEDLE STF DIL 15CM (VASCULAR) ×1 IMPLANT
KIT INTVN ENROUTE TRANSCAROTID NEUROPROTECTION (VASCULAR) ×1 IMPLANT
LOOP VESSEL MINI ELIP 406X.8MM LF  RD 2 PCH RADOPQ SIL STERION DISP STRL (PERFUSION/HEART SUPPLIES) ×1 IMPLANT
PACK SURG SPCL STRL DISP LF (CUSTOM TRAYS & PACK) ×1
PACK SURG ~~LOC~~ SPCL STRL DISP LF (CUSTOM TRAYS & PACK) ×1 IMPLANT
SHEATH INTROD 10CM 8FR PINN R/O II DIL KINK RST SMOOTH TRNS 2.5CM (INTRODUCER) ×1 IMPLANT
SPONGE SURG 4X4IN 16 PLY XRY DETECT COTTON STRL LF  DISP (WOUND CARE SUPPLY) ×2 IMPLANT
STENT ARTERY 7X30MM ENROUTE TRANSCAROTID CRTD BARE METAL (STENTS VASCULAR) ×1 IMPLANT
SUTURE 3-0 SH VICRYL+ 27IN VIOL BRD ANBCTRL COAT ABS (SUTURE/WOUND CLOSURE) ×2 IMPLANT
SUTURE 4-0 PS2 MONOCRYL + MTPS 18IN MONOF ANBCTRL ABS (SUTURE/WOUND CLOSURE) ×1 IMPLANT
SUTURE POLYPROP PROLENE BLU MONOF NONAB STRL LF (SUTURE/WOUND CLOSURE) ×1 IMPLANT
SUTURE SILK 2-0 SH PERMAHAND 30IN BLK BRD NONAB (SUTURE/WOUND CLOSURE) ×1 IMPLANT
TOWEL SURG BLU 27X17IN STD COTTON RADOPQ ABS PREWASH DELINT STRL LF  DISP (MED SURG SUPPLIES) ×1 IMPLANT
TUBING SUCT CLR 10FT .25IN MEDIVAC MXGR NCDTV MALE TO MALE CONN STRL LF  DISP (MED SURG SUPPLIES) ×1 IMPLANT
WIPE SURGICAL INSTRUMENT VISIWIPE L2 7/8 IN X W2 7/8 IN CLEAN (MED SURG SUPPLIES) ×1 IMPLANT

## 2023-06-29 NOTE — Anesthesia Postprocedure Evaluation (Signed)
Anesthesia Post Op Evaluation    Patient: Hector [name]  Procedure(s):  TCAR - IMAGING ONLY    Last Vitals:Temperature: 36.4 C (97.5 F) (06/29/23 1221)  Heart Rate: 64 (06/29/23 1330)  BP (Non-Invasive): (!) 114/58 (06/29/23 1330)  Respiratory Rate: 16 (06/29/23 1330)  SpO2: 94 % (06/29/23 1330)    No notable events documented.    Patient is sufficiently recovered from the effects of anesthesia to participate in the evaluation and has returned to their pre-procedure level.  Patient location during evaluation: PACU       Patient participation: complete - patient participated  Level of consciousness: awake and alert and responsive to verbal stimuli    Pain management: adequate  Airway patency: patent    Anesthetic complications: no  Cardiovascular status: acceptable  Respiratory status: acceptable  Hydration status: acceptable  Patient post-procedure temperature: Pt Normothermic   PONV Status: Absent

## 2023-06-29 NOTE — Anesthesia Transfer of Care (Signed)
ANESTHESIA TRANSFER OF CARE   Hector [name] is a 62 y.o. ,male, Weight: 114 kg (250 lb 7.1 oz)   had Procedure(s):  TCAR - IMAGING ONLY  performed  06/29/23   Primary Service: Jamesetta Geralds, *    Past Medical History:   Diagnosis Date    Alcohol abuse     Carotid stenosis     Chronic obstructive airway disease (CMS HCC)     Cirrhosis (CMS HCC)     Closed fracture of transverse process of thoracic vertebra (CMS HCC) 05/01/2021    Coma (CMS HCC)     after fall due to alcholism May 2022    Delirium, withdrawal, alcoholic (CMS HCC)     Depression     Encephalopathy 05/15/2021    Esophageal reflux     HTN (hypertension)     Hyperlipidemia     Ileus (CMS HCC) 05/18/2021    Left pulmonary contusion 05/01/2021    Leukopenia 05/18/2021    Multiple rib fractures 05/01/2021    Oxygen dependent     Pleural effusion 05/11/2021    Serum ammonia increased (CMS HCC) 05/15/2021    Shortness of breath     Spleen injury 05/01/2021    Status post thoracentesis 05/19/2021    Thrombocytopenia (CMS HCC) 05/01/2021    Unknown cause of injury     fx nose    Wears glasses       Allergy History as of 06/29/23       BEE VENOM PROTEIN (HONEY BEE)         Noted Status Severity Type Reaction    05/01/21 1124 Berlin Hun, LPN 91/47/82 Active       Comments: Bee stings                   I completed my transfer of care / handoff to the receiving personnel during which we discussed:  Access, Airway, All key/critical aspects of case discussed, Analgesia, Antibiotics, Expectation of post procedure, Fluids/Product, Gave opportunity for questions and acknowledgement of understanding, Labs and PMHx      Post Location: PACU                                                             Last OR Temp: Temperature: 36.4 C (97.5 F)  ABG:  PH (ARTERIAL)   Date Value Ref Range Status   05/19/2021 7.43 7.35 - 7.45 Final     PH (T)   Date Value Ref Range Status   05/19/2021 7.42 7.35 - 7.45 Final     PCO2 (ARTERIAL)   Date Value Ref Range Status    05/19/2021 29 (L) 35 - 45 mm/Hg Final     PO2 (ARTERIAL)   Date Value Ref Range Status   05/19/2021 70 (L) 72 - 100 mm/Hg Final     SODIUM   Date Value Ref Range Status   05/19/2021 142 137 - 145 mmol/L Final     POTASSIUM   Date Value Ref Range Status   06/22/2023 4.6 3.5 - 5.1 mmol/L Final     KETONES   Date Value Ref Range Status   05/17/2021 Negative Negative mg/dL Final     WHOLE BLOOD POTASSIUM   Date Value Ref Range Status   05/19/2021 4.3 3.5 - 4.6  mmol/L Final     CHLORIDE   Date Value Ref Range Status   05/19/2021 115 (H) 101 - 111 mmol/L Final     CALCIUM   Date Value Ref Range Status   06/22/2023 9.2 8.4 - 10.2 mg/dL Final     Calculated P Axis   Date Value Ref Range Status   06/03/2023 43 degrees Final     Calculated R Axis   Date Value Ref Range Status   06/03/2023 34 degrees Final     Calculated T Axis   Date Value Ref Range Status   06/03/2023 21 degrees Final     IONIZED CALCIUM   Date Value Ref Range Status   05/19/2021 1.22 1.10 - 1.35 mmol/L Final     LACTATE   Date Value Ref Range Status   05/19/2021 0.8 0.0 - 1.3 mmol/L Final     HEMOGLOBIN   Date Value Ref Range Status   05/19/2021 10.3 (L) 12.0 - 18.0 g/dL Final     OXYHEMOGLOBIN   Date Value Ref Range Status   05/19/2021 92.8 85.0 - 98.0 % Final     CARBOXYHEMOGLOBIN   Date Value Ref Range Status   05/19/2021 2.1 0.0 - 2.5 % Final     MET-HEMOGLOBIN   Date Value Ref Range Status   05/19/2021 1.3 0.0 - 2.0 % Final     BASE EXCESS (ARTERIAL)   Date Value Ref Range Status   05/17/2021 0.1 0.0 - 1.0 mmol/L Final     BASE DEFICIT   Date Value Ref Range Status   05/19/2021 4.2 (H) 0.0 - 3.0 mmol/L Final     BICARBONATE (ARTERIAL)   Date Value Ref Range Status   05/19/2021 21.6 18.0 - 26.0 mmol/L Final     TEMPERATURE, COMP   Date Value Ref Range Status   05/19/2021 37.0   C Final     Airway:* No LDAs found *  Blood pressure (!) 146/72, pulse 86, temperature 36.4 C (97.5 F), resp. rate 20, height 1.753 m (5\' 9" ), weight 114 kg (250 lb 7.1 oz),  SpO2 96%.

## 2023-06-29 NOTE — Care Management Notes (Signed)
[facility]  Care Management Initial Evaluation    Patient Name: Hector [name]  Date of Birth: October 07, 1961  Sex: male  Date/Time of Admission: 06/29/2023  7:18 AM  Room/Bed: 660/A  Payor: HEALTH PLAN MEDICAID / Plan: HEALTH PLAN MEDICAID / Product Type: Medicaid MC /   Primary Care Providers:  Robin Searing, MD, MD (General)    Pharmacy Info:   Preferred Pharmacy       [facility] [phone] - [address], [facility] - [address]    [address] [place] [address]    Phone: [phone] Fax: [phone]    Hours: Not open 24 hours          Emergency Contact Info:   Extended Emergency Contact Information  Primary Emergency Contact: HUGHES,[name]  Address: [address]           [place], [place] [address] [place]  Home Phone: [phone]  Mobile Phone: [phone]  Relation: Significant other  Preferred language: English  Interpreter needed? No    History:   Marino Rogerson is a 62 y.o., male, admitted s/p TCAR. Home plan when ready. Potential DC on 7/16, pending vascular clearance.    Height/Weight: 175.3 cm (5\' 9" ) / 113 kg (250 lb)     LOS: 0 days   Admitting Diagnosis: Carotid stenosis, asymptomatic, right [I65.21]    Assessment:   Care manager at bedside to complete care management initial assessment. Home address, insurance information, and primary care provider verified. Patient lives at home with spouse. Denies any home accessibility or safety concerns. States use of 4L continuous 02. The patient is currently retired. Denies any financial concerns regarding medication, food, utility, or housing costs. Patient is unable to drive but has adequate transportation.  Not currently under the care of any home agencies.     Social Determinants of Health     Financial Resource Strain: Low Risk  (06/29/2023)    Financial Resource Strain     SDOH Financial: No   Transportation Needs: Low Risk  (06/29/2023)    Transportation Needs     SDOH Transportation: No   Social Connections: Low Risk  (06/29/2023)    Social  Connections     SDOH Social Isolation: 5 or more times a week   Recent Concern: Social Connections - Medium Risk (06/29/2023)    Social Connections     SDOH Social Isolation: 3 to 5 times a week   Intimate Partner Violence: Low Risk  (06/29/2023)    Intimate Partner Violence     SDOH Domestic Violence: No   Housing Stability: Low Risk  (06/29/2023)    Housing Stability     SDOH Housing Situation: I have housing.     SDOH Housing Worry: No        06/29/23 1405   Assessment Details   Assessment Type Continued Assessment   Date of Care Management Update 06/29/23   Insurance Information/Type   Insurance type Medicaid  (Health Plan Medicaid)   Employment/Financial   Patient has Prescription Coverage?  Yes        Name of Insurance Coverage for Medications Health Plan Medicaid   Financial Concerns none   Living Environment   Select an age group to open "lives with" row.  Adult   Lives With spouse   Living Arrangements house   Able to Return to Prior Arrangements no   Home Safety   Home Assessment: No Problems Identified   Home Accessibility no concerns   Care  Management Plan   Discharge Planning Status plan in progress   Discharge plan discussed with: Patient   Discharge Needs Assessment   Equipment Currently Used at Home oxygen  (4L continous)   Equipment Needed After Discharge oxygen   Community Agency Name(s) [facility] is patients oxygen provider   Discharge Facility/Level of Care Needs Home (Patient/Family Member/other)(code 1)   Transportation Available car   Referral Information   Admission Type inpatient   Address Verified verified-no changes   Arrived From home or self-care   Mutuality/Individual Preferences    Patient-Specific Preferences None stated   Anxieties, Fears or Concerns No anxieties, fears, or concerns stated       Discharge Plan:  Home (Patient/Family Member/other) (code 1)  The patient will continue to be evaluated for developing discharge needs.     Case Manager: Mort Sawyers, RN  Phone:  [phone]

## 2023-06-29 NOTE — OR PreOp (Signed)
Dr.Redlinger in to see patient,signed consent, and marked patient

## 2023-06-29 NOTE — H&P (Signed)
[facility]   OUTPATIENT CLINIC EVALUATION     Date: 05/20/2023  Patient: Hector [name]  DOB: 03-06-1961  PCP: Robin Searing, MD     CHIEF COMPLAINT:        Chief Complaint   Patient presents with    New Patient       Carotid artery stenosis  Cta 04/27/23  Duplex 04/02/23         HPI:   Hector [name] is a 62 y.o. male who is seen today in the [facility] for carotid disease.  He has multiple medical problems including history of alcohol abuse, chronic obstructive pulmonary disease requiring oxygen, cirrhosis and hypertension.  He does have a known history of carotid disease but has never undergone carotid revascularization.  Today in the office, hedenies any symptoms of amaurosis, speech disturbance, facial droop or unilateral extremity paresthesia or weakness. He denies any history of stroke in the past.  His current medical regimen includes aspirin and a statin.  He is an active smoker but is currently actively trying to quit.     PMH:        Past Medical History:   Diagnosis Date    Alcohol abuse      Chronic obstructive airway disease (CMS HCC)      Cirrhosis (CMS HCC)      Coma (CMS [facility])       after fall due to alcholism May 2022    Delirium, withdrawal, alcoholic (CMS HCC)      Depression      HTN (hypertension)      Unknown cause of injury       fx nose    Wears glasses           Family Hx:  Family Medical History:            Problem Relation (Age of Onset)     Asthma Father     Cerebral Aneurysm Sister                Medications:         Current Outpatient Medications   Medication Sig Dispense Refill    albuterol sulfate (VENTOLIN HFA) 90 mcg/actuation Inhalation oral inhaler Take 2 Puffs by inhalation Every 6 hours as needed for Other (SHORTNESS OF BREATH,COUGH OR WHEEZING) 18 Each 5    fluticasone propion-salmeteroL (ADVAIR HFA) 230-21 mcg/actuation Inhalation oral inhaler Take 2 Puffs by inhalation Twice daily 1 g 5    ipratropium-albuterol 0.5 mg-3 mg(2.5 mg base)/3 mL  Solution for Nebulization NEBULIZE 1 VIAL 3 TIMES A DAY (8 HOURS APART) AS NEEDED FOR COUGH, SHORTNESS OF BREATH, WHEEZING 270 mL 5    nystatin (NYSTOP) 100,000 unit/gram Powder APPLY TO AFFECTED AREA TWICE A DAY (Patient not taking: Reported on 03/31/2023) 60 g 1    OLANZapine (ZYPREXA) 10 mg Oral Tablet Take 1 Tablet (10 mg total) by mouth Every night        oxygen (O2) Inhalation gas Administer 4 L/min into affected nostril(s) continuous        pantoprazole (PROTONIX) 40 mg Oral Tablet, Delayed Release (E.C.) TAKE 1 TABLET (40 MG TOTAL) BY MOUTH TWICE A DAY 30 MINTUTES BEFORE MEALS FOR 90 DAYS 60 Tablet 3    rosuvastatin (CRESTOR) 5 mg Oral Tablet Take 2 Tablets (10 mg total) by mouth Every evening 90 Tablet 1    sertraline (ZOLOFT) 100 mg Oral Tablet Take 2 Tablets (200 mg total) by mouth  Once a day for 30 days 30 Tablet 0    traZODone (DESYREL) 50 mg Oral Tablet Take 1 Tablet (50 mg total) by mouth Every night Unsure dosage          No current facility-administered medications for this visit.   Allergies:        Allergies   Allergen Reactions    Bee Venom Protein (Honey Bee)         Bee stings   Past Surgical Hx:        Past Surgical History:   Procedure Laterality Date    COLONOSCOPY   08/2021    ESOPHAGOGASTRODUODENOSCOPY   08/2021    HX TONSILLECTOMY             Social Hx:  Social History            Socioeconomic History    Marital status: Divorced       Spouse name: Not on file    Number of children: Not on file    Years of education: Not on file    Highest education level: Not on file   Occupational History    Not on file   Tobacco Use    Smoking status: Every Day       Current packs/day: 1.00       Average packs/day: 1 pack/day for 50.4 years (50.4 ttl pk-yrs)       Types: Cigarettes       Start date: 68    Smokeless tobacco: Never    Tobacco comments:       THE PATIENT STARTED    Vaping Use    Vaping status: Never Used   Substance and Sexual Activity    Alcohol use: Not Currently       Alcohol/week:  15.0 standard drinks of alcohol       Types: 15 Cans of beer per week       Comment:  may 17th, 2022     Drug use: Yes       Types: Marijuana       Comment:  a joint or 2 a day     Sexual activity: Yes   Other Topics Concern    Not on file   Social History Narrative    Not on file      Social Determinants of Health           Financial Resource Strain: Low Risk  (10/21/2022)     Financial Resource Strain      SDOH Financial: No   Transportation Needs: Low Risk  (10/21/2022)     Transportation Needs      SDOH Transportation: No   Social Connections: Medium Risk (10/21/2022)     Social Connections      SDOH Social Isolation: 3 to 5 times a week   Intimate Partner Violence: Low Risk  (10/21/2022)     Intimate Partner Violence      SDOH Domestic Violence: No   Housing Stability: Low Risk  (10/21/2022)     Housing Stability      SDOH Housing Situation: I have housing.      SDOH Housing Worry: No      REVIEW OF SYSTEMS:  Constitutional: negative for fevers, chills, sweats and fatigue  Eyes: negative for blurred vision or field defects  HEENT: negative for hearing loss  Respiratory: negative for cough and shortness of breath  Cardiovascular: negative for chest pain, negative for claudication  Gastrointestinal: negative for post prandial  pain  Genitourinary: negative for dysuria, continues to urinate  Musculoskeletal: Per HPI  Neurological: negative for unilateral weakness or monocular blindness  Integumentary: negative for wounds or ulcers     PHYSICAL EXAM:  Vitals: BP (!) 150/65 (Site: Left, Patient Position: Sitting)   Pulse 66   Ht 1.753 m (5\' 9" )   Wt 113 kg (249 lb)   SpO2 90%   BMI 36.77 kg/m      General: AA&O X3. Nontoxic.  Well developed and well nourished in no acute distress   HENT: Head is normocephalic, atraumatic   Eyes: Pupils equal and round and reactive to light; conjunctiva clear. Sclera without icterus; EOMO grossly intact.    Neck: Normal ROM, Supple, symmetrical  Lungs: Effort normal, clear to  auscultation bilaterally.   Cardiovascular: Heart regular rate and rhythm, no murmur, rub or gallop  Abdomen: Bowel sounds normal; soft, non distended non-tender to palpation, no rebound or guarding present. No palpable masses.  Extremities: extremities normal, atraumatic, no cyanosis or edema  Skin:  Skin warm and dry     Vascular:       Right carotid artery: 2+ (normal) Left carotid artery: 2+ (normal)   Right brachial artery: 2+ (normal) Left brachial artery: 2+ (normal)   Right radial artery:  2+ (normal) Left radial artery: 2+ (normal)   Right femoral artery: 2+ (normal) Left femoral artery: 2+ (normal)      DIAGNOSTIC STUDIES REVIEWED:  I have independently reviewed and interpreted available imaging.  My interpretations are summarized below.    I personally reviewed his CTA images from Apr 27, 2023.  The right internal carotid artery demonstrates a high-grade, 80-90% stenosis in the proximal segment of the vessel.  It does returned to normal beyond this.  The left internal carotid artery has a 60-70% stenosis.  The right vertebral artery is dominant and there is potential occlusion of the left vertebral artery.  The circle of Willis however is normal-appearing.     ASSESSMENT:  Mr. Borawski is a pleasant 62 year old male with multiple medical problems seen today for a high-grade, asymptomatic right carotid stenosis.  His CTA demonstrates a severe 80-90% stenosis of the proximal right internal carotid artery.     PLAN:  I had a long discussion with the patient today regarding carotid disease as it pertains to stroke risk and the indications for carotid revascularization. He has a severe stenosis of the right internal carotid artery that I have recommended undergo revascularization to minimize his risk of stroke.  We discussed both carotid endarterectomy and TCAR.  After discussing both procedures, he would like to move forward with a right TCAR. I discussed the risks and benefits of right trans carotid artery  revascularization (TCAR) with the patient, including the specific risks of death, heart attack, stroke, bleeding, infection, nerve injury and the need for further intervention. He expresses understanding and wishes to proceed.  Formal written consent was obtained today in the office.  I would like him to see Cardiology for perioperative risk stratification prior to this plan procedure. Will plan for carotid intervention to be performed at [facility] in the near future.     The patient was given the opportunity to ask questions and those questions were answered to their satisfaction. Instructed to call with any further questions or concerns.      Lillia Mountain., MD  [facility]  Surgical History and Physical  Hector [name], 62 y.o. male  Date of Admission:  06/29/2023  Date of Birth:  12/07/1961    06/29/2023  Chief Complaint: Procedure(s):  TCAR - IMAGING ONLY      History obtained patient    HPI:  see hx above from Dr. Alger Memos.      Allergies   Allergen Reactions    Bee Venom Protein (Honey Bee)      Bee stings     Medications:  Current Outpatient Medications   Medication Instructions    albuterol sulfate (VENTOLIN HFA) 90 mcg/actuation Inhalation oral inhaler 2 Puffs, Inhalation, EVERY 6 HOURS PRN    aspirin (ECOTRIN) 81 mg, Oral, DAILY    clopidogreL (PLAVIX) 75 mg, Oral, DAILY    fluticasone propion-salmeteroL (ADVAIR HFA) 230-21 mcg/actuation Inhalation oral inhaler 2 Puffs, Inhalation, 2 TIMES DAILY    ipratropium-albuterol 0.5 mg-3 mg(2.5 mg base)/3 mL Solution for Nebulization NEBULIZE 1 VIAL 3 TIMES A DAY (8 HOURS APART) AS NEEDED FOR COUGH, SHORTNESS OF BREATH, WHEEZING    nicotine (NICODERM CQ) 21 mg, Transdermal, DAILY    nicotine (NICODERM CQ) 14 mg, Transdermal, DAILY, START THERAPY AFTER COMPLETION OF 21 MG DAILY PATCH FOR ONE MONTH    nicotine (NICODERM CQ) 7 mg,  Transdermal, DAILY, START THERAPY AFTER COMPLETION OF 14 MG DAILY FOR 2 WEEKS    OLANZapine (ZYPREXA) 10 mg, Oral, NIGHTLY    oxygen (O2) 4 L/min, Nasal, CONTINUOUS    pantoprazole (PROTONIX) 40 mg Oral Tablet, Delayed Release (E.C.) TAKE 1 TABLET (40 MG TOTAL) BY MOUTH TWICE A DAY 30 MINTUTES BEFORE MEALS FOR 90 DAYS    rosuvastatin (CRESTOR) 10 mg, Oral, EVERY EVENING    sertraline (ZOLOFT) 200 mg, Oral, DAILY    traZODone (DESYREL) 50 mg, Oral, NIGHTLY, Unsure dosage     Review of systems:   as per HPI  Past Medical History:   Diagnosis Date    Alcohol abuse     Carotid stenosis     Chronic obstructive airway disease (CMS HCC)     Cirrhosis (CMS HCC)     Closed fracture of transverse process of thoracic vertebra (CMS HCC) 05/01/2021    Coma (CMS HCC)     after fall due to alcholism May 2022    Delirium, withdrawal, alcoholic (CMS HCC)     Depression     Encephalopathy 05/15/2021    Esophageal reflux     HTN (hypertension)     Hyperlipidemia     Ileus (CMS HCC) 05/18/2021    Left pulmonary contusion 05/01/2021    Leukopenia 05/18/2021    Multiple rib fractures 05/01/2021    Oxygen dependent     Pleural effusion 05/11/2021    Serum ammonia increased (CMS HCC) 05/15/2021    Shortness of breath     Spleen injury 05/01/2021    Status post thoracentesis 05/19/2021    Thrombocytopenia (CMS HCC) 05/01/2021    Unknown cause of injury     fx nose    Wears glasses          Past Surgical History:   Procedure Laterality Date    COLONOSCOPY  08/2021    ESOPHAGOGASTRODUODENOSCOPY  08/2021    HX TONSILLECTOMY      OTHER SURGICAL HISTORY      HX of plastic surgery to right head         Social History     Socioeconomic History    Marital status: Divorced   Tobacco Use    Smoking status: Every  Day     Current packs/day: 1.00     Average packs/day: 1 pack/day for 50.5 years (50.5 ttl pk-yrs)     Types: Cigarettes     Start date: 66    Smokeless tobacco: Never    Tobacco comments:     THE PATIENT STARTED    Vaping Use    Vaping  status: Never Used   Substance and Sexual Activity    Alcohol use: Not Currently     Alcohol/week: 15.0 standard drinks of alcohol     Types: 15 Cans of beer per week     Comment:  may 17th, 2022     Drug use: Yes     Types: Marijuana     Comment:  a joint or 2 a day     Sexual activity: Yes   Other Topics Concern    Ability to Walk 1 Flight of Steps without SOB/CP No    Ability To Do Own ADL's Yes    Uses Walker No    Uses Cane No     Social Determinants of Health     Financial Resource Strain: Low Risk  (10/21/2022)    Financial Resource Strain     SDOH Financial: No   Transportation Needs: Low Risk  (10/21/2022)    Transportation Needs     SDOH Transportation: No   Social Connections: Medium Risk (06/29/2023)    Social Connections     SDOH Social Isolation: 3 to 5 times a week   Intimate Partner Violence: Low Risk  (10/21/2022)    Intimate Partner Violence     SDOH Domestic Violence: No   Housing Stability: Low Risk  (10/21/2022)    Housing Stability     SDOH Housing Situation: I have housing.     SDOH Housing Worry: No     Family Medical History:       Problem Relation (Age of Onset)    Asthma Father    Cerebral Aneurysm Sister    Dementia Mother    Heart Attack Paternal Grandmother (71)               Examination:  BP 117/68   Pulse 66   Temp 36.4 C (97.6 F)   Resp 20   Ht 1.753 m (5\' 9" )   Wt 114 kg (250 lb 7.1 oz)   SpO2 96%   BMI 36.98 kg/m         Physical Exam  Constitutional:       Appearance: Normal appearance.   HENT:      Head: Normocephalic and atraumatic.      Nose: Nose normal.      Mouth/Throat:      Mouth: Mucous membranes are moist.   Eyes:      Extraocular Movements: Extraocular movements intact.      Pupils: Pupils are equal, round, and reactive to light.   Cardiovascular:      Rate and Rhythm: Normal rate and regular rhythm.      Pulses: Normal pulses.   Pulmonary:      Effort: Pulmonary effort is normal.      Comments: BS diminished with wheezing throughout , 4 liters O2  Abdominal:       General: Abdomen is flat. Bowel sounds are normal.      Palpations: Abdomen is soft.      Comments: Some erythema to inguinal areas   Musculoskeletal:         General: Normal range of motion.  Cervical back: Normal range of motion and neck supple.   Skin:     General: Skin is warm and dry.      Capillary Refill: Capillary refill takes less than 2 seconds.   Neurological:      General: No focal deficit present.      Mental Status: He is oriented to person, place, and time.   Psychiatric:         Mood and Affect: Mood normal.         Behavior: Behavior normal.          Labs: @LABS3M @     Diagnostic Imaging:     No orders to display        Assessment:   Right carotid stenosis    Plan: Patient here today for Procedure(s):  TCAR - IMAGING ONLY with Surgeon(s):  Redlinger, Gerlene Burdock, MD.      Misty Stanley [facility] Hein[facility]s, PA-C   06/29/2023, 09:13

## 2023-06-29 NOTE — Care Plan (Signed)
Problem: Adult Inpatient Plan of Care  Goal: Patient-Specific Goal (Individualized)  Outcome: Ongoing (see interventions/notes)  Flowsheets (Taken 06/29/2023 1628)  Individualized Care Needs: Monitor BP  Anxieties, Fears or Concerns: NONE VOICED  Patient-Specific Goals (Include Timeframe): home  Plan of Care Reviewed With: patient

## 2023-06-29 NOTE — Progress Notes (Signed)
Pt arrived from PACU, groin and neck site intact post TCAR. Pt is alert, resting with no pain. Family at bedside, call light within reach.

## 2023-06-29 NOTE — Anesthesia Procedure Notes (Signed)
Hector [name]    Arterial Line Procedure    Pt location: In OR  Consent:     Consent given by:  Patient    Alternatives discussed:  No treatment  Universal protocol:     Procedure explained and questions answered to patient or proxy's satisfaction: yes      Immediately prior to procedure a time out was called: yes and 06/29/2023 10:30 AM      Patient identity confirmed:  Arm band  Pre-procedure details:     Preparation: Preprocedure hand washing was performed; sterile field was maintained         Skin Prep used: alcohol  Anesthesia (see MAR for exact dosages):     Anesthesia method:  Under general anesthesia    A 20 G Catheter type: Arrow 1 and 1/2 inch in length,  Placed on the right  radial artery  using guidewire With  number of attempts:1.Secured with: transparent dressing   MEDICATIONS:     Post-procedure details:    Patient tolerance of procedure:  Tolerated well, no immediate complications Waveform appropriate  Complications:  Performed By:  Performing provider: Teddy Spike, CRNA Authorizing provider: Pilar Plate, MD  I was present and supervised/observed the entire procedure.  Teddy Spike, CRNA 06/29/2023, 10:52

## 2023-06-29 NOTE — OR Surgeon (Signed)
[facility]  HVI VASCULAR SURGERY   OPERATIVE REPORT    PROCEDURE DATE: 06/29/2023  PREOPERATIVE DIAGNOSIS: Asymptomatic high-grade right carotid stenosis  POST OPERATIVE DIAGNOSIS: SAME  PROCEDURE: Right trans carotid artery revascularization (TCAR)  SURGEON: Naaman Plummer, Jr., MD  ASSISTANT:  Andris Flurry, PA-C  assisted with exposure, closure and assistance with stent placement  ANESTHESIA: General Anesthesia and Local  COMPLICATIONS: None  EBL: Minimal  IMPLANTS:  7-9 x67mm ENROUTE stent in rightinternal carotid artery  FINDINGS: Good flow through the stent in the right internal carotid artery  DESCRIPTION OF PROCEDURE:  The patient was brought to the operating room, where support lines were placed and general anesthesia was established. The right neck and left groin were prepped and the patient was sterilely draped. A transverse 3 cm incision was made between the sternal and clavicular heads of the sternocleidomastoid muscle, below the omohyoid. Following longitudinal division of the carotid sheath the jugular vein was partially dissected and retracted medially. Once 3 cm of right common carotid artery were isolated, a vessel loop was placed around the proximal 1/3 of the CCA under direct vision. A 5.0 Prolene suture was pre-placed in the anterior wall of the CCA, in a "Z stitch" configuration, close to the clavicle to facilitate hemostasis upon removal of the arterial sheath at completion of the TCAR procedure.  The contralateral left common femoral vein was noted to be patent and was accessed via ultrasound guidance, using standard Seldinger and micropuncture access technique. Permanent recorded images were saved in the patient's medical record. The Venous Return Sheath was advanced into the CFV over an 0.035" wire. Blood was aspirated from the flow line followed by flushing of the Venous Sheath with heparinized saline. The Venous Sheath was secured to the patient's skin with suture to  maintain optimal position in the vessel. Heparin was given to obtain a therapeutic activated clotting time >250 seconds prior to arterial access. A 4-French non-stiffened ENHANCE Transcarotid Access set was used, puncturing the artery with the 21G needle through the pre-placed "Z" stitch while holding gentle traction on the umbilical tape to stabilize the CCA within the incision. The micropuncture wire was then advanced 3-4 cm into the CCA and the 21G needle was removed. The micropuncture sheath was advanced 2 cm into the CCA and the wire and dilator were removed. Pulsatile backflow indicated correct positioning. An 0.035" J-tipped guidewire was inserted as close as possible to the bifurcation without engaging the lesion.   After micropuncture sheath removal, the Transcarotid Arterial Sheath was advanced to the 2.5cm marker and the 0.035" wire and dilator were then removed. Arterial Sheath position was assessed under fluoroscopy in two projections to ensure that the sheath tip was oriented coaxially in the CCA. The Arterial Sheath was sutured to the patient with gentle forward tension. Blood was slowly aspirated followed by flushing with heparinized saline. No ingress of air bubbles through the passive hemostatic valve was observed. The stopcocks were closed.  The Flow Controller was connected to the Transcarotid Arterial Sheath, prepared by passively allowing a column of arterial blood to fill the line and connected to the Venous Return Sheath. CCA inflow was occluded proximal to the arteriotomy with the vessel loop to achieve active flow reversal. To confirm flow reversal, a saline bolus was delivered into the venous flow line on both "High" and "Low" flow settings of the Flow Controller. Angiograms were performed with slow injections of a small amount of contrast filling just past the lesion to minimize  antegrade transmission of micro-bubbles.  Prior to lesion manipulation, heart rate and systolic BP were managed  upwards to optimize flow reversal and procedural neuroprotection. The lesion was crossed with an 0.014" ENROUTE guidewire and pre-dilation of the lesion was performed with a 5 mm x  25  mm rapid exchange 0.014" compatible balloon. Stenting was performed with a  7-9 x60mm ENROUTE transcarotid stent, sized appropriately to the CCA. We did not post dilate the stent. AP and lateral angiograms were performed to confirm stent placement and arterial wall stent apposition. Antegrade flow was then restored by releasing the vessel loop on the CCA then closing the stopcocks to the flow lines.  The total flow reversal time was 6 minutes and the lesion length measured 20 mm. The Transcarotid Arterial Sheath was removed and the pre-closure suture was tied. Heparin reversal was employed. Once the wound was copiously irrigated and excellent hemostasis was noted, the subcutaneous tissues were closed with running 3-0 Vicryl suture and the skin reapproximated with 4-0 Monocryl. A sterile dressing was applied.  The Venous Return Sheath was removed and hemostasis was achieved with brief manual compression.  The patient tolerated the procedure well and was extubated on the table. The patient was moving all four extremities to command prior to transfer to the recovery room.

## 2023-06-30 DIAGNOSIS — Z48812 Encounter for surgical aftercare following surgery on the circulatory system: Secondary | ICD-10-CM

## 2023-06-30 LAB — CBC
HCT: 36 % (ref 36.0–46.0)
HGB: 12.4 g/dL — ABNORMAL LOW (ref 13.9–16.3)
MCH: 31.7 pg (ref 25.4–34.0)
MCHC: 34.5 g/dL (ref 30.0–37.0)
MCV: 91.9 fL (ref 80.0–100.0)
MPV: 8.9 fL (ref 7.5–11.5)
PLATELETS: 64 10*3/uL — ABNORMAL LOW (ref 130–400)
RBC: 3.91 10*6/uL — ABNORMAL LOW (ref 4.30–5.90)
RDW: 16.2 % — ABNORMAL HIGH (ref 11.5–14.0)
WBC: 3.4 10*3/uL — ABNORMAL LOW (ref 4.5–11.5)

## 2023-06-30 LAB — BASIC METABOLIC PANEL
ANION GAP: 5 mmol/L (ref 5–19)
BUN/CREA RATIO: 14 (ref 6–20)
BUN: 12 mg/dL (ref 9–20)
CALCIUM: 8.4 mg/dL (ref 8.4–10.2)
CHLORIDE: 112 mmol/L — ABNORMAL HIGH (ref 98–107)
CO2 TOTAL: 24 mmol/L (ref 22–30)
CREATININE: 0.86 mg/dL (ref 0.66–1.20)
ESTIMATED GFR: >60 mL/min/{1.73_m2} (ref >60)
GLUCOSE: 103 mg/dL (ref 74–106)
POTASSIUM: 4 mmol/L (ref 3.5–5.1)
SODIUM: 141 mmol/L (ref 137–145)

## 2023-06-30 NOTE — Nurses Notes (Signed)
Patient on home oxygen, wife at bedside to transport home on discharge. Instructed and educated on the use of home O2 during transportation back home. Per patient and wife "he will be fine we only live ten minutes from here." Again educated on risks on discharge without O2 use, patient and wife verbalized understanding. Patient transported to car with O2.

## 2023-06-30 NOTE — Care Plan (Signed)
Problem: Adult Inpatient Plan of Care  Goal: Plan of Care Review  Outcome: Ongoing (see interventions/notes)  Goal: Patient-Specific Goal (Individualized)  Outcome: Ongoing (see interventions/notes)  Goal: Absence of Hospital-Acquired Illness or Injury  Outcome: Ongoing (see interventions/notes)  Intervention: Identify and Manage Fall Risk  Recent Flowsheet Documentation  Taken 06/29/2023 2030 by Rebecka Apley, RN  Safety Promotion/Fall Prevention:   activity supervised   fall prevention program maintained   muscle strengthening facilitated   nonskid shoes/slippers when out of bed   safety round/check completed  Intervention: Prevent Skin Injury  Recent Flowsheet Documentation  Taken 06/29/2023 2030 by Rebecka Apley, RN  Skin Protection:   adhesive use limited   transparent dressing maintained   tubing/devices free from skin contact  Goal: Optimal Comfort and Wellbeing  Outcome: Ongoing (see interventions/notes)  Goal: Rounds/Family Conference  Outcome: Ongoing (see interventions/notes)

## 2023-06-30 NOTE — Care Management Notes (Signed)
[facility]  Care Management Initial Evaluation    Patient Name: Hector [name]  Date of Birth: 05-04-1961  Sex: male  Date/Time of Admission: 06/29/2023  7:18 AM  Room/Bed: 660/A  Payor: HEALTH PLAN MEDICAID / Plan: HEALTH PLAN MEDICAID / Product Type: Medicaid MC /   Primary Care Providers:  Hector Searing, MD, MD (General)    Pharmacy Info:   Preferred Pharmacy       [facility] [phone] [address], [facility] - [address]    [address] [place] [address]    Phone: [phone] Fax: [phone]    Hours: Not open 24 hours          Emergency Contact Info:   Extended Emergency Contact Information  Primary Emergency Contact: Hector [name]  Address: [address]           [place], [place] [address] [place]  Home Phone: [phone]  Mobile Phone: [phone]  Relation: Significant other  Preferred language: English  Interpreter needed? No    History:   Hector [name] is a 62 y.o., male, admitted s/p TCAR. Home plan when ready. Potential DC on 7/16, pending vascular clearance.    Height/Weight: 175.3 cm (5\' 9" ) / 113 kg (250 lb)     LOS: 1 day   Admitting Diagnosis: Carotid stenosis, asymptomatic, right [I65.21]    Assessment:   Care manager at bedside to complete care management initial assessment. Home address, insurance information, and primary care provider verified. Patient lives at home with spouse. Denies any home accessibility or safety concerns. States use of 4L continuous 02. The patient is currently retired. Denies any financial concerns regarding medication, food, utility, or housing costs. Patient is unable to drive but has adequate transportation.  Not currently under the care of any home agencies.     Social Determinants of Health     Financial Resource Strain: Low Risk  (06/29/2023)    Financial Resource Strain     SDOH Financial: No   Transportation Needs: Low Risk  (06/29/2023)    Transportation Needs     SDOH Transportation: No   Social Connections: Low Risk  (06/29/2023)    Social  Connections     SDOH Social Isolation: 5 or more times a week   Recent Concern: Social Connections - Medium Risk (06/29/2023)    Social Connections     SDOH Social Isolation: 3 to 5 times a week   Intimate Partner Violence: Low Risk  (06/29/2023)    Intimate Partner Violence     SDOH Domestic Violence: No   Housing Stability: Low Risk  (06/29/2023)    Housing Stability     SDOH Housing Situation: I have housing.     SDOH Housing Worry: No        06/29/23 1405   Assessment Details   Assessment Type Continued Assessment   Date of Care Management Update 06/29/23   Insurance Information/Type   Insurance type Medicaid  (Health Plan Medicaid)   Employment/Financial   Patient has Prescription Coverage?  Yes        Name of Insurance Coverage for Medications Health Plan Medicaid   Financial Concerns none   Living Environment   Select an age group to open "lives with" row.  Adult   Lives With spouse   Living Arrangements house   Able to Return to Prior Arrangements no   Home Safety   Home Assessment: No Problems Identified   Home Accessibility no concerns   Care  Management Plan   Discharge Planning Status plan in progress   Discharge plan discussed with: Patient   Discharge Needs Assessment   Equipment Currently Used at Home oxygen  (4L continous)   Equipment Needed After Discharge oxygen   Community Agency Name(s) [facility] is patients oxygen provider   Discharge Facility/Level of Care Needs Home (Patient/Family Member/other)(code 1)   Transportation Available car   Referral Information   Admission Type inpatient   Address Verified verified-no changes   Arrived From home or self-care   Mutuality/Individual Preferences    Patient-Specific Preferences None stated   Anxieties, Fears or Concerns No anxieties, fears, or concerns stated     06/30/2023: (09:07) Chart reviewed by care manager. Patient is anticipated to discharge home pending vascular clearance. No additional discharge needs identified at this time.     Discharge Plan:   Home (Patient/Family Member/other) (code 1)  The patient will continue to be evaluated for developing discharge needs.     Case Manager: Hector Sawyers, RN  Phone: [phone]

## 2023-06-30 NOTE — Nurses Notes (Signed)
Patient home plan will remove tele and IV prior to discharge.

## 2023-06-30 NOTE — Discharge Summary (Signed)
[facility]  DISCHARGE SUMMARY    PATIENT NAME:  Rance, Smithson  MRN:  [id number]  DOB:  11/04/1961    ENCOUNTER DATE:  06/29/2023  INPATIENT ADMISSION DATE: 06/29/2023  DISCHARGE DATE:  06/30/2023    ATTENDING PHYSICIAN: Redlinger, Richard, MD  SERVICE: [facility] VASCULAR SURGERY  PRIMARY CARE PHYSICIAN: Robin Searing, MD       Lay Caregiver Name: [name]   Lay Caregiver Contact Number: [phone]   Lay Caregiver Relationship to patient: spouse/significant other    PRIMARY DISCHARGE DIAGNOSIS: Carotid stenosis, asymptomatic, right  Active Hospital Problems    Diagnosis Date Noted    Principal Problem: Carotid stenosis, asymptomatic, right [I65.21] 06/29/2023    Carotid stenosis, right [I65.21] 06/03/2023      Resolved Hospital Problems   No resolved problems to display.     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        Details   albuterol sulfate 90 mcg/actuation oral inhaler  Commonly known as: Ventolin HFA   2 Puffs, Inhalation, EVERY 6 HOURS PRN  Qty: 18 Each  Refills: 5     aspirin 81 mg Tablet, Delayed Release (E.C.)  Commonly known as: ECOTRIN   81 mg, Oral, DAILY  Refills: 0     clopidogreL 75 mg Tablet  Commonly known as: PLAVIX   75 mg, Oral, DAILY  Qty: 90 Tablet  Refills: 1     fluticasone propion-salmeteroL 230-21 mcg/actuation oral inhaler  Commonly known as: Advair HFA   2 Puffs, Inhalation, 2 TIMES DAILY  Qty: 1 g  Refills: 5     ipratropium-albuteroL 0.5 mg-3 mg(2.5 mg base)/3 mL nebulizer solution  Commonly known as: DUONEB    NEBULIZE 1 VIAL 3 TIMES A DAY (8 HOURS APART) AS NEEDED FOR COUGH, SHORTNESS OF BREATH, WHEEZING  Qty: 270 mL  Refills: 5     * nicotine 21 mg/24 hr Patch 24 hr  Commonly known as: NICODERM CQ   21 mg, Transdermal, DAILY  Qty: 30 Each  Refills: 0     * nicotine 14 mg/24 hr Patch 24 hr  Commonly known as: NICODERM CQ   14 mg, Transdermal, DAILY, START THERAPY AFTER COMPLETION OF 21 MG DAILY PATCH FOR ONE MONTH  Qty: 14 Patch  Refills: 0     * nicotine 7 mg/24 hr Patch 24 hr  Commonly known as: NICODERM CQ   7 mg, Transdermal, DAILY, START THERAPY AFTER COMPLETION OF 14 MG DAILY FOR 2 WEEKS  Qty: 14 Each  Refills: 0     OLANZapine 10 mg Tablet  Commonly known as: zyPREXA   10 mg, Oral, NIGHTLY  Refills: 0     oxygen gas  Commonly known as: o2   4 L/min, Nasal, CONTINUOUS  Refills: 0     pantoprazole 40 mg Tablet, Delayed Release (E.C.)  Commonly known as: PROTONIX   TAKE 1 TABLET (40 MG TOTAL) BY MOUTH TWICE A  DAY 30 MINTUTES BEFORE MEALS FOR 90 DAYS  Qty: 60 Tablet  Refills: 3     rosuvastatin 5 mg Tablet  Commonly known as: CRESTOR   10 mg, Oral, EVERY EVENING  Qty: 90 Tablet  Refills: 1     sertraline 100 mg Tablet  Commonly known as: ZOLOFT   200 mg, Oral, DAILY  Qty: 30 Tablet  Refills: 0     traZODone 50 mg Tablet  Commonly known as: DESYREL   50 mg, Oral, NIGHTLY, Unsure dosage  Refills: 0           * This list has 3 medication(s) that are the same as other medications prescribed for you. Read the directions carefully, and ask your doctor or other care provider to review them with you.                Discharge med list refreshed?  YES     Allergies   Allergen Reactions    Bee Venom Protein (Honey Bee)      Bee stings     HOSPITAL PROCEDURE(S):   No orders of the defined types were placed in this encounter.    Surgical/Procedural Cases on this Admission       Case IDs Date Procedure Surgeon Location Status    [phone] 06/29/23 TCAR - IMAGING ONLY Redlinger, Gerlene Burdock, MD [facility] IN[facility]IVE [facility]S Comp          REASON  FOR HOSPITALIZATION AND HOSPITAL COURSE   BRIEF HPI:  This is a 62 y.o., male medical history dyslipidemia, chronic obstructive pulmonary disease, tobacco use, alcohol use, liver cirrhosis, right carotid stenosis.  April 16th had carotid artery duplex showing right ICA greater than 70% stenosis in greater than 50% diameter reduction noted right ECA.  Patient was evaluated and deemed appropriate candidate for TCAR which she underwent yesterday.      BRIEF HOSPITAL NARRATIVE:  Patient underwent TCAR yesterday by Dr. Alger Memos.  Tolerated the procedure well.  This morning had unremarkable lab work.  Has rested comfortably in no distress or significant pain and has had no postop complication     TRANSITION/POST DISCHARGE CARE/PENDING TESTS/REFERRALS:   Patient denies any home accessibility or safety concerns.  Denies any financial concerns.  He is independent at home.  Patient wears 4 L nasal cannula oxygen continuously at home.  He has had no overnight postop complications and he remained stable with unremarkable lab work.  We will plan to discharge patient home today with 4 week follow-up carotid duplex and clinical follow-up.  Patient voiced understanding    CONDITION ON DISCHARGE:  A. Ambulation: Full ambulation  B. Self-care Ability: Complete  C. Cognitive Status Alert         LINES/DRAINS/WOUNDS AT DISCHARGE:   Patient Lines/Drains/Airways Status       Active Line / Dialysis Catheter / Dialysis Graft / Drain / Airway / Wound       Name Placement date Placement time Site Days    Peripheral IV Left;Posterior Superficial Dorsal Vein 06/29/23  0837  -- 1    Wound  Incision Right Neck 06/29/23  1102  -- 1    Wound  Other (comment) Left Groin 06/29/23  1158  -- 1                    DISCHARGE DISPOSITION:  Home discharge  DISCHARGE INSTRUCTIONS:  Post-Discharge Follow Up Appointments       Monday Jul 13, 2023    Return Patient  Visit with Robin Searing, MD at  2:30 PM      Tuesday Jul 14, 2023    Return Patient Visit  with Consuelo Pandy, PA-C at  1:40 PM      Wednesday Jul 29, 2023    Return Patient Visit with Jamesetta Geralds, MD at  2:00 PM      Wednesday Oct 28, 2023    Return Patient Visit with Biscan-Banks, Doroteo Glassman, CFNP at  1:00 PM      Gastroenterology, [facility] 3  [facility] 3, [place]  [address] [place] [address]  [phone] Internal Medicine, [facility], [place]  [address]  [place] [address]  [phone] Pulmonology, Satira Sark [facility], [place]  [address]  [place] [address]  [phone]    Vascular Surgery [facility], [facility] [address]  [facility] [address], [place]  [address]  [place] [address]  [phone]          No discharge procedures on file.       Loma Sousa, APRN    Copies sent to Care Team         Relationship Specialty Notifications Start End    Robin Searing, MD PCP - General INTERNAL MEDICINE Admissions 07/22/21     Phone: [phone] Fax: [phone]         [address] [place] [address]    Leim Fabry  NURSE PRACTITIONER Admissions 06/22/23     Phone: [phone] Fax: [phone]         [phone] [place] [facility] [address]            Referring providers can utilize [email or web address] to access their referred [facility] Medicine patient's information.

## 2023-07-01 ENCOUNTER — Ambulatory Visit: Payer: 59 | Admitting: Physician Assistant

## 2023-07-01 ENCOUNTER — Ambulatory Visit (INDEPENDENT_AMBULATORY_CARE_PROVIDER_SITE_OTHER): Payer: Self-pay | Admitting: Internal Medicine

## 2023-07-06 ENCOUNTER — Encounter: Payer: Self-pay | Admitting: Internal Medicine

## 2023-07-07 ENCOUNTER — Encounter: Payer: Self-pay | Admitting: Neurology

## 2023-07-07 ENCOUNTER — Ambulatory Visit (INDEPENDENT_AMBULATORY_CARE_PROVIDER_SITE_OTHER): Payer: 59 | Admitting: Neurology

## 2023-07-07 VITALS — BP 125/78 | HR 71 | Ht 65.0 in | Wt 148.0 lb

## 2023-07-07 DIAGNOSIS — G5730 Lesion of lateral popliteal nerve, unspecified lower limb: Secondary | ICD-10-CM | POA: Diagnosis not present

## 2023-07-07 DIAGNOSIS — G5623 Lesion of ulnar nerve, bilateral upper limbs: Secondary | ICD-10-CM

## 2023-07-07 MED ORDER — ROSUVASTATIN CALCIUM 20 MG PO TABS: 20.0000 mg | ORAL_TABLET | Freq: Every day | ORAL | 0 refills | Status: DC

## 2023-07-07 NOTE — Patient Instructions (Addendum)
Electromyoneurogram Electromyoneurogram is a test to check how well your muscles and nerves are working. This procedure includes the combined use of electromyogram (EMG) and nerve conduction study (NCS). EMG is used to evaluate muscles and the nerves that control those muscles. NCS, which is also called electroneurogram, measures how well your nerves conduct electricity. The procedures should be done together to check if your muscles and nerves are healthy. If the results of the tests are abnormal, this may indicate disease or injury, such as a neuromuscular disease or peripheral nerve damage. Tell a health care provider about: Any allergies you have. All medicines you are taking, including vitamins, herbs, eye drops, creams, and over-the-counter medicines. Any bleeding problems you have. Any surgeries you have had. Any medical conditions you have. What are the risks? Generally, this is a safe procedure. However, problems may occur, including: Bleeding or bruising. Infection where the electrodes were inserted. What happens before the test? Medicines Take all of your usually prescribed medications before this testing is performed. Do not stop your blood thinners unless advised by your prescribing physician. General instructions Your health care provider may ask you to warm the limb that will be checked with warm water, hot pack, or wrapping the limb in a blanket. Do not use lotions or creams on the same day that you will be having the procedure. What happens during the test? For EMG  Your health care provider will ask you to stay in a position so that the muscle being studied can be accessed. You will be sitting or lying down. You may be given a medicine to numb the area (local anesthetic) and the skin will be disinfected. A very thin needle that has an electrode will be inserted into your muscle, one muscle at a time. Typically, multiple muscles are evaluated during a single study. Another  small electrode will be placed on your skin near the muscle. Your health care provider will ask you to continue to remain still. The electrodes will record the electrical activity of your muscles. You may see this on a monitor or hear it in the room. After your muscles have been studied at rest, your health care provider will ask you to contract or flex your muscles. The electrodes will record the electrical activity of your muscles. Your health care provider will remove the electrodes and the electrode needle when the procedure is finished. The procedure may vary among health care providers and hospitals. For NCS  An electrode that records your nerve activity (recording electrode) will be placed on your skin by the muscle that is being studied. An electrode that is used as a reference (reference electrode) will be placed near the recording electrode. A paste or gel will be applied to your skin between the recording electrode and the reference electrode. Your nerve will be stimulated with a mild shock. The speed of the nerves and strength of response is recorded by the electrodes. Your health care provider will remove the electrodes and the gel when the procedure is finished. The procedure may vary among health care providers and hospitals. What can I expect after the test? It is up to you to get your test results. Ask your health care provider, or the department that is doing the test, when your results will be ready. Your health care provider may: Give you medicines for any pain. Monitor the insertion sites to make sure that bleeding stops. You should be able to drive yourself to and from the test. Discomfort can  persist for a few hours after the test, but should be better the next day. Contact a health care provider if: You have swelling, redness, or drainage at any of the insertion sites. Summary Electromyoneurogram is a test to check how well your muscles and nerves are working. If the  results of the tests are abnormal, this may indicate disease or injury. This is a safe procedure. However, problems may occur, such as bleeding and infection. Your health care provider will do two tests to complete this procedure. One checks your muscles (EMG) and another checks your nerves (NCS). It is up to you to get your test results. Ask your health care provider, or the department that is doing the test, when your results will be ready. This information is not intended to replace advice given to you by your health care provider. Make sure you discuss any questions you have with your health care provider. Document Revised: 08/14/2021 Document Reviewed: 07/14/2021 [employer] Patient Education  2024 [employer]  Cubital Tunnel Syndrome       Cubital tunnel syndrome is a condition that causes pain, numbness, tingling, and weakness of the forearm and hand. It happens when your ulnar nerve is irritated or pinched (compressed). The ulnar nerve runs from your shoulder to the small finger (pinkie) of your hand. In most cases, cubital tunnel syndrome is caused by arm motions that are done over and over during sports or while at work. What are the causes? This condition may be caused by: Pressure on the ulnar nerve. This may be from: Repeated elbow bending. Poorly healed broken bones (fractures). Tumors in the elbow. In most cases, these are not cancer. Scar tissue that forms in the elbow after an injury. Bony growths (spurs) near the ulnar nerve. Stretching of the nerve. This may happen when the tissues that connect bones to each other (ligaments) become loose. Trauma to the nerve at the elbow. What increases the risk? You may be more likely to get this condition if: You do manual labor and have to bend your elbow a lot. You play sports that include a lot of throwing motions, such as baseball. You play contact sports, such as football or lacrosse. You do not warm up enough before you do  activities. You have diabetes. You have hypothyroidism. This is when your thyroid does not make enough hormones. What are the signs or symptoms? Symptoms of this condition include: Clumsiness or weakness of the hand. You also may not be able to grip or pinch firmly. Aching, soreness, or tenderness of the inner elbow, forearm, or fingers. You may feel this most in your pinkie or ring finger. More pain when you force your elbow to bend or shooting pain from your elbow to your hand. Reduced control when you throw objects. Tingling, numbness, or a burning feeling in your forearm, hand, or fingers. You may feel this most in your pinkie or ring finger. How is this diagnosed? This condition is diagnosed based on your symptoms, your medical history, and a physical exam. Your health care provider will ask about any injuries. You may also have tests, such as: An electromyogram (EMG). This is done to see how well your nerves are working. A nerve conduction study (NCS). This is done to see how electrical signals pass through your nerves. Imaging tests, such as X-rays, ultrasound, and MRI. These are done to find the source of your nerve problems. How is this treated? This condition may be treated by: Stopping the activities that  make your symptoms worse. Icing your elbow. Taking medicines to reduce pain and swelling. Wearing a removable brace. This stops your elbow from bending. Wearing an elbow pad where the ulnar nerve is closest to the skin. Working with a physical therapist. This may help your symptoms get better. It can also help you improve the strength and range of motion of your elbow, forearm, and hand. Steroids. These are medicines that are injected into your body to reduce inflammation. If these treatments do not help, you may need surgery. Follow these instructions at home: Medicines Take over-the-counter and prescription medicines only as told by your provider. Ask your provider if the  medicine prescribed to you requires you to avoid driving or using machinery. If you have a removable brace: Wear the brace as told by your provider. Remove it only as told by your provider. Check the skin around the brace every day. Tell your provider about any concerns. Loosen the brace if your fingers tingle, become numb, or turn cold and blue. Keep the brace clean. If the brace is not waterproof: Do not let it get wet. Cover it with a watertight covering when you take a bath or shower. Managing pain, stiffness, and swelling  If told, put ice on the injured area. If you have a removable brace, remove it as told by your provider. Put ice in a plastic bag. Place a towel between your skin and the bag. Leave the ice on for 20 minutes, 2-3 times a day. If your skin turns bright red, remove the ice right away to prevent skin damage. The risk of damage is higher if you cannot feel pain, heat, or cold. Move your fingers often to reduce stiffness and swelling. Raise (elevate) the injured area above the level of your heart while you are sitting or lying down. General instructions Do exercises or physical therapy as told by your provider. If you were given an elbow pad or brace, wear it as told by your provider. Keep all follow-up visits. Your provider will check if your symptoms are getting better. They can also adjust the treatment if needed. Contact a health care provider if: Your symptoms get worse. Your symptoms do not get better with treatment. You have new pain. Your hand on the injured side feels numb or cold. This information is not intended to replace advice given to you by your health care provider. Make sure you discuss any questions you have with your health care provider. Document Revised: 09/19/2022 Document Reviewed: 09/19/2022 Else[employer] Patient Education  2024 [employer]  Common Peroneal Nerve Entrapment   Common peroneal nerve entrapment is a condition that can make it  hard to lift a foot. The condition results from pressure on a nerve in the lower leg called the common peroneal nerve. Your common peroneal nerve provides feeling to your outer lower leg and foot. It also supplies the muscles that move your foot and toes upward and outward. What are the causes? This condition may be caused by: Sitting cross-legged, squatting, or kneeling for long periods of time. A hard, direct hit to the outside of the lower leg. Swelling from a knee injury. A break or fracture in one of the lower leg bones. Wearing a boot or cast that ends just below the knee. A growth or cyst near the nerve. What increases the risk? This condition is more likely to develop in people who play: Contact sports, such as football or hockey. Sports where you wear high and stiff boots,  such as skiing. What are the signs or symptoms? Symptoms of this condition include: Trouble lifting your foot up (foot drop). Tripping often. Your foot hitting the ground harder than normal as you walk, resulting in a slapping-type sound. Numbness, tingling, or pain in the outside of the knee, outside of the lower leg, and top of the foot. Sensitivity to pressure on the front or outside of the leg. How is this diagnosed? This condition may be diagnosed based on: Your symptoms. Your medical history. A physical exam. Tests, such as: X-rays to check the bones of your knee and leg. MRI to check tendons that attach to the side of your knee. Ultrasound to check for a growth or cyst. Electromyogram (EMG) to check your nerves. During your physical exam, your health care provider will check for numbness in your leg and test the strength of your lower leg muscles. He or she may tap the side of your lower leg to see if that causes tingling. How is this treated? Treatment for this condition may include: Avoiding activities that make symptoms worse. Using a brace to hold up your foot and toes. Taking  anti-inflammatory pain medicines to relieve swelling and lessen pain. Having medicines injected into your ankle joint to lessen pain and swelling. Doing exercises to help you regain or maintain movement (physical therapy). Surgery to take pressure off the nerve. This may be needed if there is no improvement after 2-3 months or if there is a growth pushing on the nerve. Returning gradually to full activity. Follow these instructions at home: If you have a removable brace: Wear the brace as told by your health care provider. Remove it only as told by your health care provider. Check the skin around the brace every day. Tell your health care provider about any concerns. Loosen the brace if your toes tingle, become numb, or turn cold and blue. Keep the brace clean. If the brace is not waterproof: Do not let it get wet. Cover it with a watertight covering when you take a bath or a shower. Activity Ask your health care provider when it is safe to drive if you have a brace on your foot. Do not do any activities that make pain or swelling worse. Do exercises as told by your health care provider. Return to your normal activities as told by your health care provider. Ask your health care provider what activities are safe for you. General instructions Take over-the-counter and prescription medicines only as told by your health care provider. Do not put your full weight on your knee until your health care provider says you can. Use crutches as directed by your health care provider. Keep all follow-up visits. This is important. How is this prevented? Wear supportive footwear that is appropriate for your athletic activity. Avoid athletic activities that cause ankle pain or swelling. Wear protective padding over your lower legs when playing contact sports. Make sure your boots do not put extra pressure on the area just below your knees. Do not sit cross-legged for long periods of time. Contact a health  care provider if: Your symptoms do not get better in 2-3 months. The weakness or numbness in your leg or foot gets worse. Summary Common peroneal nerve entrapment is a condition that results from pressure on a nerve in the lower leg called the common peroneal nerve. This condition may be caused by a hard hit, swelling, a fracture, or a cyst in the lower leg. Treatment may include rest, a brace,  medicines, and physical therapy. Sometimes surgery is needed. Do not do any activities that make pain or swelling worse. This information is not intended to replace advice given to you by your health care provider. Make sure you discuss any questions you have with your health care provider. Document Revised: 08/14/2021 Document Reviewed: 08/14/2021 [employer] Patient Education  2024 [employer].

## 2023-07-07 NOTE — Progress Notes (Signed)
[facility]    Provider:  Dr Lucia Gaskins Requesting Provider: Patsy Lager, Gwenlyn Found, MD Primary Care Provider:  Pearline Cables, MD  CC:  numbness in the fingers and toes  HPI:  Curtis Luna is a 62 y.o. male here as requested by Copland, Gwenlyn Found, MD for bilateral upper and lower extremity paresthesias. has Esophageal reflux; Diverticulosis of colon (without mention of hemorrhage); Internal hemorrhoid; Chronic low back pain; Anxiety; Seasonal affective disorder (HCC); Erectile dysfunction of organic origin; Vertigo; FH: brain aneurysm; Peripheral neuropathy; Multiple thyroid nodules; Pain of left heel; Toe pain, right; and Allergic reaction to vaccine on their problem list.  I reviewed epic chart, patient was seen in the emergency room April 1 for numbness with a history of lumbar radiculopathy, peripheral neuropathy, anxiety and migraines and he presented with bilateral upper numbness and tingling.  Symptoms began a few days prior.  He had lower extremity symptoms and was started on a steroid pack by his primary care.  Upper extremity symptoms were initially intermittent however he woke up in the morning and had continued tingling to the tips of the bilateral fingers with left worse than right.  He did golf for the first time the day prior and believes that symptoms may have also been because he has been sleeping on his left side.  Tingling symptoms began in the elbows with radiation down the at the time it was only in the fingertips.  He is also been having daily headaches he reported.  And does have chronic neck pain.  Exam including neurologic exam was normal.  He saw his primary care shortly after, for concern of upper extremity numbness, besides neuropathy, coronary artery disease, ocular migraines, GERD hyperlipidemia, he is taking gabapentin 100 3 times daily and is felt like it was helpful for him.  The symptoms have been going on since 89 at which she had concerns of numbness  and tingling in the right foot and leg occasionally symptoms in the left side as well.  He recently had lumbar spine x-rays per [facility] these were reviewed and a trial of prednisone was started.  MRI lumbar spine is in the works.  No mention of the symptoms at the last appointment with Dr. Vivianne Master  Apr 29 2023.  I see that imaging was ordered: Tingling in the toes is transient, only when sitting too long, gets up moves around is ok, foot falls asleep,  no cervicalradicular pain.   Happens more when he sleeps. Both arms, radiates to digit  4-5. He has elbow pain. He sleeps with his elbows bent. Happens more at night. From the elbows to the 4-5th digits. No weakness. His legs also falls asleep when he sits too lone. His foot, either, falls asleep when he thinks of it ismore when he crosses his legs. Discussed ulnar neuropathy and his +tinel's sign at the elbow and showed images online of ulnar neuropathy. Also discussed crossing his legs and he agreed it happens when sitting ot bending or crossing and his foot falls asleep may be peroneal neuropathy again educated him and dshowed images online of peroneal neuropathy at the knee. We discussed emg/ncs. No other focal neurologic deficits, associated symptoms, inciting events or modifiable factors.   Reviewed notes, labs and imaging from outside physicians, which showed:  MRI cervical spine: 04/17/2023: COMPARISON: July 25, 2021   FINDINGS:  No prevertebral soft tissue swelling/edema. Mild edematous endplate signal changes at C5-C6 and C7-T1, nonspecific but likely degenerative in etiology. The  cervical spinal cord is normal in signal intensity. 1 mm anterolisthesis of C7 on T1. The vertebral body heights are maintained. Mild/moderate multilevel intervertebral disc height loss.   C2-3: No significant spinal canal or neural foraminal stenosis.  C3-4: Left paracentral posterior disc osteophyte complex contributes to mild spinal canal stenosis. No  significant neural foraminal stenosis.  C4-5: Uncovertebral hypertrophy contributes to moderate left and mild right neural foraminal stenosis. No significant spinal canal stenosis.  C5-6: Central posterior disc osteophyte complex contributes to mild spinal canal stenosis. Uncovertebral hypertrophy contributes to mild bilateral neural foraminal stenosis.  C6-7: No significant spinal canal or neural foraminal stenosis.  C7-T1: Anterolisthesis and facet arthrosis contribute to mild/moderate bilateral neural foraminal stenosis. No significant spinal canal stenosis.    IMPRESSION:  Degenerative changes of the cervical spine stable to minimally progressed compared to the prior.   MRI lumbar spine 04/17/2023:   MRI lumbar spine:  INDICATION: Numbness and tingling in the right leg  TECHNIQUE: Sagittal and axial T1 and T2-weighted sequences were performed. Additional sagittal STIR images were performed.  COMPARISON: None available  FINDINGS: #  Osseous structures: No compression fracture. #  Alignment: Mild retrolisthesis L4-5. #  Modic changes: Absent #  Conus medullaris: Normal. Terminates at L1 with no evidence of tethering. #  Lower thoracic segments: No significant abnormality.   #  L1-2: Unremarkable  #  L2-3: Unremarkable  #  L3-4: Mild bulging of the disc. Mild facet joint arthritis. No spinal or foraminal stenosis.  #  L4-5: Mild degenerative disc disease. Minimal retrolisthesis. Mild facet joint arthritis. No spinal or foraminal stenosis.  #  L5-S1: There is an enlarged left transverse process of L5 which forms a pseudoarticulation with the sacrum. No spinal or foraminal stenosis. Procedure Note  Bettey Mare, MD - 03/31/2023 Formatting of this note might be different from the original. MRI lumbar spine:  INDICATION: Numbness and tingling in the right leg  TECHNIQUE: Sagittal and axial T1 and T2-weighted sequences were performed. Additional sagittal STIR images were  performed.  COMPARISON: None available  FINDINGS: #  Osseous structures: No compression fracture. #  Alignment: Mild retrolisthesis L4-5. #  Modic changes: Absent #  Conus medullaris: Normal. Terminates at L1 with no evidence of tethering. #  Lower thoracic segments: No significant abnormality.   #  L1-2: Unremarkable  #  L2-3: Unremarkable  #  L3-4: Mild bulging of the disc. Mild facet joint arthritis. No spinal or foraminal stenosis.  #  L4-5: Mild degenerative disc disease. Minimal retrolisthesis. Mild facet joint arthritis. No spinal or foraminal stenosis.  #  L5-S1: There is an enlarged left transverse process of L5 which forms a pseudoarticulation with the sacrum. No spinal or foraminal stenosis.   IMPRESSION: 1.  Mild degenerative disc disease and facet joint arthritis. 2.  No spinal or foraminal stenosis. 3.  Middle retrolisthesis L4-5. 4.  Enlarged left transverse process of L5 which forms a pseudoarticulation with the sacrum.  03/16/2023:     Latest Ref Rng & Units 03/16/2023    4:27 PM 01/29/2023    7:21 AM 01/23/2022    8:37 AM  CBC  WBC 4.0 - 10.5 K/uL 8.0  5.3  6.2   Hemoglobin 13.0 - 17.0 g/dL 13.0  86.5  78.4   Hematocrit 39.0 - 52.0 % 42.5  42.4  42.2   Platelets 150 - 400 K/uL 228  262.0  267.0       Latest Ref Rng & Units 03/16/2023  4:27 PM 01/29/2023    7:21 AM 01/23/2022    8:37 AM  CMP  Glucose 70 - 99 mg/dL 409  91  95   BUN 8 - 23 mg/dL 21  15  18    Creatinine 0.61 - 1.24 mg/dL 8.11  9.14  7.82   Sodium 135 - 145 mmol/L 139  140  142   Potassium 3.5 - 5.1 mmol/L 4.3  4.4  4.4   Chloride 98 - 111 mmol/L 104  104  104   CO2 22 - 32 mmol/L 28  29  35   Calcium 8.9 - 10.3 mg/dL 8.9  9.7  9.5   Total Protein 6.0 - 8.3 g/dL  6.9  6.8   Total Bilirubin 0.2 - 1.2 mg/dL  0.4  0.5   Alkaline Phos 39 - 117 U/L  40  41   AST 0 - 37 U/L  24  26   ALT 0 - 53 U/L  22  24    B12/folate 444/20.1 nml, hgba1c 5.7  Review of Systems: Patient complains of  symptoms per HPI as well as the following symptoms none. Pertinent negatives and positives per HPI. All others negative.   Social History   Socioeconomic History   Marital status: Married    Spouse name: Not on file   Number of children: Not on file   Years of education: Not on file   Highest education level: Some college, no degree  Occupational History   Occupation: Community education officer   Tobacco Use   Smoking status: Former    Current packs/day: 0.00    Types: Cigarettes, Cigars    Quit date: 10/04/1987    Years since quitting: 35.7   Smokeless tobacco: Never  Vaping Use   Vaping status: Never Used  Substance and Sexual Activity   Alcohol use: Yes    Alcohol/week: 14.0 standard drinks of alcohol    Types: 14 Standard drinks or equivalent per week    Comment: 2 daily    Drug use: No   Sexual activity: Not on file  Other Topics Concern   Not on file  Social History Narrative   1 caffeine drink daily  Lives with wife in a one story home.  Has 2 children.  Works in Act[employer].   Social Determinants of Health   Financial Resource Strain: Low Risk  (03/16/2023)   Overall Financial Resource Strain (CARDIA)    Difficulty of Paying Living Expenses: Not hard at all  Food Insecurity: No Food Insecurity (03/16/2023)   Hunger Vital Sign    Worried About Running Out of Food in the Last Year: Never true    Ran Out of Food in the Last Year: Never true  Transportation Needs: No Transportation Needs (03/16/2023)   PRAPARE - Administrator, Civil Service (Medical): No    Lack of Transportation (Non-Medical): No  Physical Activity: Unknown (03/16/2023)   Exercise Vital Sign    Days of Exercise per Week: 0 days    Minutes of Exercise per Session: Not on file  Stress: No Stress Concern Present (03/16/2023)   [employer] of Occupational Health - Occupational Stress Questionnaire    Feeling of Stress : Only a little  Social Connections: Moderately Isolated (03/16/2023)    Social Connection and Isolation Panel [NHANES]    Frequency of Communication with Friends and Family: Once a week    Frequency of Social Gatherings with Friends and Family: Once a week    Attends Religious  Services: 1 to 4 times per year    Active Member of Clubs or Organizations: No    Attends Banker Meetings: Not on file    Marital Status: Married  Intimate Partner Violence: Unknown (03/19/2022)   Received from [employer], [facility]   HITS    Physically Hurt: Not on file    Insult or Talk Down To: Not on file    Threaten Physical Harm: Not on file    Scream or Curse: Not on file    Family History  Problem Relation Age of Onset   Lung cancer Mother 101       Deceased   Other Father        MVA   Aneurysm Sister        Brain   Healthy Brother        x2   Heart disease Paternal Grandmother    Heart attack Paternal Grandmother    Allergies Daughter        x2   Migraines Daughter        x1   Diabetes Maternal Aunt    Neuropathy Other    Colon cancer Neg Hx    Colon polyps Neg Hx    Esophageal cancer Neg Hx    Rectal cancer Neg Hx    Stomach cancer Neg Hx    Pancreatic cancer Neg Hx    Allergic rhinitis Neg Hx    Angioedema Neg Hx    Asthma Neg Hx    Atopy Neg Hx    Eczema Neg Hx    Immunodeficiency Neg Hx    Urticaria Neg Hx     Past Medical History:  Diagnosis Date   Abdominal pain, right lower quadrant    Acute bronchospasm    Allergy    Anxiety    on and off   Backache, unspecified    Chicken pox    Diverticulosis of colon (without mention of hemorrhage)    Esophageal reflux    External hemorrhoids without mention of complication    thrombosed   Hiatal hernia    Hyperlipidemia    no medicines    Inguinal hernia without mention of obstruction or gangrene, unilateral or unspecified, (not specified as recurrent)    Migraine    Neuropathy    Obstructive sleep apnea (adult) (pediatric)    uses oral appliance   Other symptoms  involving digestive system(787.99)    Pain in joint, forearm    Pain in joint, shoulder region    Seasonal allergies    Sleep apnea    no cpap but uses dental device    Thoracic or lumbosacral neuritis or radiculitis, unspecified     Patient Active Problem List   Diagnosis Date Noted   Allergic reaction to vaccine 11/12/2020   Toe pain, right 08/23/2018   Pain of left heel 06/27/2018   Multiple thyroid nodules 11/05/2015   Peripheral neuropathy 04/18/2015   Vertigo 04/15/2015   FH: brain aneurysm 04/15/2015   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   Seasonal affective disorder (HCC) 10/02/2014   Internal hemorrhoid 04/20/2014   Chronic low back pain 04/20/2014   Anxiety 04/20/2014   Esophageal reflux 04/04/2011   Diverticulosis of colon (without mention of hemorrhage) 04/04/2011    Past Surgical History:  Procedure Laterality Date   COLON RESECTION  10/06   sigmoid colon   COLONOSCOPY     INGUINAL HERNIA REPAIR  12/2012   Left   UPPER GASTROINTESTINAL ENDOSCOPY  WISDOM TOOTH EXTRACTION      Current Outpatient Medications  Medication Sig Dispense Refill   Ascorbic Acid (VITAMIN C) 1000 MG tablet Take 1,000 mg by mouth daily.     b complex vitamins tablet Take 1 tablet by mouth daily. Every other day     cholecalciferol (VITAMIN D3) 25 MCG (1000 UT) tablet Take 1,000 Units by mouth daily.     Famotidine (PEPCID PO) Take by mouth as needed.     gabapentin (NEURONTIN) 100 MG capsule Take 1 capsule (100 mg total) by mouth 3 (three) times daily. (Patient taking differently: Take by mouth. Takes 100 mg in the morning and 300 mg at night) 270 capsule 3   LORazepam (ATIVAN) 1 MG tablet Take 1 tablet (1 mg total) by mouth as needed for anxiety. May take 1 per day 30 tablet 0   rosuvastatin (CRESTOR) 20 MG tablet Take 1 tablet (20 mg total) by mouth daily. 90 tablet 0   No current facility-administered medications for this visit.    Allergies as of 07/07/2023 -  Review Complete 07/07/2023  Allergen Reaction Noted   Escitalopram Hives 06/05/2014   Protonix [pantoprazole sodium] Other (See Comments) 02/27/2015    Vitals: BP 125/78 (BP Location: Right Arm, Patient Position: Sitting)   Pulse 71   Ht 5\' 5"  (1.651 m)   Wt 148 lb (67.1 kg)   BMI 24.63 kg/m  Last Weight:  Wt Readings from Last 1 Encounters:  07/07/23 148 lb (67.1 kg)   Last Height:   Ht Readings from Last 1 Encounters:  07/07/23 5\' 5"  (1.651 m)     Physical exam: Exam: Gen: NAD, conversant, well nourised,  well groomed                     CV: RRR, no MRG. No Carotid Bruits. No peripheral edema, warm, nontender Eyes: Conjunctivae clear without exudates or hemorrhage  Neuro: Detailed Neurologic Exam  Speech:    Speech is normal; fluent and spontaneous with normal comprehension.  Cognition:    The patient is oriented to person, place, and time;     recent and remote memory intact;     language fluent;     normal attention, concentration,     fund of knowledge Cranial Nerves:    The pupils are equal, round, and reactive to light. The fundi are normal and spontaneous venous pulsations are present. Visual fields are full to finger confrontation. Extraocular movements are intact. Trigeminal sensation is intact and the muscles of mastication are normal. The face is symmetric. The palate elevates in the midline. Hearing intact. Voice is normal. Shoulder shrug is normal. The tongue has normal motion without fasciculations.   Coordination:    Normal finger to nose and heel to shin. Normal rapid alternating movements.   Gait:    Heel-toe and tandem gait are normal.   Motor Observation:    No asymmetry, no atrophy, and no involuntary movements noted. Tone:    Normal muscle tone.    Posture:    Posture is normal. normal erect    Strength:    Strength is V/V in the upper and lower limbs.      Sensation: intact to LT     Reflex Exam:  DTR's:    Deep tendon reflexes  in the upper and lower extremities are normal bilaterally.   Toes:    The toes are downgoing bilaterally.   Clonus:    Clonus is absent.    Assessment/Plan:  From elbows, radiates to digit  4-5. He has elbow pain. He sleeps with his elbows bent. Happens more at night. From the elbows to the 4-5th digits. No weakness. His legs also fallsasleep when he sits too long relieved by getting up. His foot, either, falls asleep when he thinks of it ismore when he crosses his legs. Discussed ulnar neuropathy and his +tinel's sign at the elbows and showed images online of ulnar neuropathy. Also discussed crossing his legs and he agreed it happens when sitting or bending or crossing and his foot falls asleep may be peroneal neuropathy again educated him and showed images online of peroneal neuropathy at the knee. We discussed emg/ncs. No other focal neurologic deficits, associated symptoms, inciting events or modifiable factors. 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 Lowers unlikely peripheral polyneuropathy or lumbar radiculopathy but will check for peroneal neuropathy.   Emg/ncs of the bilateral uppers and left leg for peroneal neuropathy vs cervical/lumbar radic.   Orders Placed This Encounter  Procedures   NCV with EMG(electromyography)    Cc: Copland, Gwenlyn Found, MD,  Copland, Gwenlyn Found, MD  Naomie Dean, MD  [facility] [address] [place], [place] [address]  Phone [phone] Fax [phone]

## 2023-07-13 ENCOUNTER — Other Ambulatory Visit: Payer: Self-pay

## 2023-07-13 ENCOUNTER — Encounter (INDEPENDENT_AMBULATORY_CARE_PROVIDER_SITE_OTHER): Payer: Self-pay | Admitting: Internal Medicine

## 2023-07-13 ENCOUNTER — Ambulatory Visit: Payer: MEDICAID | Attending: Internal Medicine | Admitting: Internal Medicine

## 2023-07-13 VITALS — BP 132/72 | HR 67 | Resp 22 | Ht 69.0 in | Wt 249.0 lb

## 2023-07-13 DIAGNOSIS — F17200 Nicotine dependence, unspecified, uncomplicated: Secondary | ICD-10-CM | POA: Insufficient documentation

## 2023-07-13 DIAGNOSIS — Z0000 Encounter for general adult medical examination without abnormal findings: Secondary | ICD-10-CM

## 2023-07-13 DIAGNOSIS — Z0001 Encounter for general adult medical examination with abnormal findings: Secondary | ICD-10-CM | POA: Insufficient documentation

## 2023-07-13 DIAGNOSIS — Z23 Encounter for immunization: Secondary | ICD-10-CM | POA: Insufficient documentation

## 2023-07-13 DIAGNOSIS — J449 Chronic obstructive pulmonary disease, unspecified: Secondary | ICD-10-CM | POA: Insufficient documentation

## 2023-07-13 DIAGNOSIS — E785 Hyperlipidemia, unspecified: Secondary | ICD-10-CM | POA: Insufficient documentation

## 2023-07-13 DIAGNOSIS — K7460 Unspecified cirrhosis of liver: Secondary | ICD-10-CM | POA: Insufficient documentation

## 2023-07-13 MED ORDER — ROSUVASTATIN 10 MG TABLET
10.0000 mg | ORAL_TABLET | Freq: Every evening | ORAL | 1 refills | Status: DC
Start: 2023-07-13 — End: 2024-01-07

## 2023-07-13 NOTE — Patient Instructions (Addendum)
Medicare Preventive Services  Medicare coverage information Recommendation for YOU   Heart Disease and Diabetes   Lipid profile every 5 years or more often if at risk for cardiovascular disease  Lab Results   Component Value Date    CHOLESTEROL 161 04/13/2023    HDLCHOL 32 (L) 04/13/2023    LDLCHOL 114 (H) 04/13/2023    TRIG 74 04/13/2023       Diabetes Screening with Blood Glucose test or Glucose Tolerance Test Yearly for those at risk for diabetes, up to two tests per year for those with prediabetes  Last Glucose: 103     Diabetes Self-Management Training   Initial training ten hours per year, and follow-up training two hours per subsequent year. Optional for those with diabetes    Medical Nutrition Therapy   Three hours of one-on-one counseling in first year, two hours in subsequent years. Optional for those with diabetes, kidney disease   Intensive Behavioral Therapy for Obesity  Face-to-face counseling, first month every week, month 2-6 every other week, month 7-12 every month if continued progress is documented Optional for those with Body Mass Index 30 or higher  Your Body mass index is 36.77 kg/m.   Tobacco Cessation (Quitting) Counseling   Two attempts per year, max 4 sessions per attempt, up to 8 sessions per year Optional for those who use tobacco    Cancer Screening Last Completion Date   Colorectal screening   For anyone age 84 to 88 or any age if high risk:  Screening Colonoscopy every 10 yrs if low risk,  more frequent if higher risk  OR  Cologuard Stool DNA test once every 3 years OR  Fecal Occult Blood Testing yearly OR  Flexible  Sigmoidoscopy  every 5 yr OR  CT Colonography every 5 yrs  --08/26/2021  See below for due date if applicable.   Prostate Cancer Screening  Prostate Specific Antigen blood test based on joint decision making with your provider for ages 31-69  A joint decision between you and your primary care provider   Lung Cancer Screening  Annual low dose computed tomography (LDCT  scan) is recommended for those age 20-80 who smoked 20 pack-years and are current smokers or quit smoking within past 15 years, after counseling by your doctor or nurse clinician about the possible benefits or harms. --11/12/2022  See below for due date if applicable.   Vaccinations   Respiratory syncytial virus (RSV)  Age 2 years or older: Based on shared clinical decision-making with your provider.  Pneumococcal Vaccine  Recommended routinely age 75+ with one or two separate vaccines based on your risk. Recommended before age 3 if medical conditions with increased risk  Seasonal Influenza Vaccine  Once every flu season   Hepatitis B Vaccine  3 doses if risk (including anyone with diabetes or liver disease)  Shingles Vaccine  Two doses at age 21 or older  Diphtheria Tetanus Pertussis Vaccine  ONCE as adult, booster every 10 years     Immunization History   Administered Date(s) Administered    HEP A/HEP B COMBINED VACCINE (TWINRIX)(ADMIN) 03/26/2022    HEPATITIS B VIRUS RECOMB VACCINE 05/28/2022    Influenza Vaccine, 65+ 09/29/2022    Pneumovax 03/26/2022     Shingles vaccine and Diphtheria Tetanus Pertussis vaccines are available at pharmacies or local health department without a prescription.   Other Preventative Screening  Last Completion Date   Glaucoma Screening   Yearly if in high risk group such as diabetes, family  history, African American age 6+ or Hispanic American age 88+ See your Eye Care Provider   Hepatitis C Screening   Recommended  for those born between ages 18-79 years. --09/06/2021  See below for due date if applicable.     HIV Testing  Recommended routinely at least ONCE, covered every year for age 84 to 57 regardless of risk, and every year for age over 16 who ask for the test or higher risk. Yearly or up to 3 times in pregnancy    See below for due date if applicable.  Bone Densitometry   Screening is recommended for Men ages 3 and above with one or more risk factor: androgen deprivation  therapy for prostate cancer, hypogonadism, frailty, primary hyperparathyroidism, hyperthyroidism  For men diagnosed with osteoporosis, follow up is recommended every years or a frequency recommended by your provider     See below for due date if applicable.   Abdominal Aortic Aneurysm Screening Ultrasound   Once with a family history of abdominal aortic aneurysms OR a male between 12-75 and have smoked at least 100 cigarettes in your lifetime.     See below for due date if applicable.       Your Personalized Schedule for Preventive Tests     Health Maintenance: Pending and Last Completed         Date Due Completion Date    HIV Screening Never done ---    Adult Tdap-Td (1 - Tdap) Never done ---    Shingles Vaccine (1 of 2) Never done ---    Covid-19 Vaccine (1 - 2023-24 season) Never done ---    Hepatitis B Vaccine (3 of 3 - Hep B Twinrix risk 3-dose series) 10/28/2022 05/28/2022    Pneumococcal Vaccine, Age 59-64 (2 of 2 - PCV) 03/27/2023 03/26/2022    Influenza Vaccine (1) 08/16/2023 09/29/2022    NonMedicare Preventative Exam 09/30/2023 09/29/2022    CT Lung Cancer Screening 11/12/2023 11/12/2022    Depression Screening 06/28/2024 06/29/2023    Colonoscopy 08/27/2031 08/26/2021                For Information on Advanced Directives for Health Care:  [place]:  [email or web address]  [place], [place], [place], [place] General Information: [email or web address]  Continue same meds

## 2023-07-13 NOTE — Progress Notes (Signed)
INTERNAL MEDICINE, [facility]  [address]  [place] [address]  Operated by [facility]  Progress Note    Name: Hector [name] MRN:  [id number]   Date: 07/13/2023 DOB:  [birth year] (62 y.o.)             Reason for Visit: Annual Exam (Patient here today along with significant other for annual physical, patient had R sided carotid artery surgery done last month, )    Subjective  Hector [name] is a 62 y.o. year old male who comes to clinic here for his wellness visit.  He had right carotid artery endarterectomy due for the left 1 still continues to smoke not interested in stopping    No chest pain dyspnea     Complains of cough no wheezing dyspnea positive     No abdominal pain nausea vomiting diarrhea     No lower extremity edema      Current Outpatient Medications   Medication Sig    albuterol sulfate (VENTOLIN HFA) 90 mcg/actuation Inhalation oral inhaler Take 2 Puffs by inhalation Every 6 hours as needed for Other (SHORTNESS OF BREATH,COUGH OR WHEEZING)    aspirin (ECOTRIN) 81 mg Oral Tablet, Delayed Release (E.C.) Take 1 Tablet (81 mg total) by mouth Once a day    clopidogreL (PLAVIX) 75 mg Oral Tablet Take 1 Tablet (75 mg total) by mouth Once a day for 180 days    fluticasone propion-salmeteroL (ADVAIR HFA) 230-21 mcg/actuation Inhalation oral inhaler Take 2 Puffs by inhalation Twice daily    ipratropium-albuterol 0.5 mg-3 mg(2.5 mg base)/3 mL Solution for Nebulization NEBULIZE 1 VIAL 3 TIMES A DAY (8 HOURS APART) AS NEEDED FOR COUGH, SHORTNESS OF BREATH, WHEEZING    OLANZapine (ZYPREXA) 10 mg Oral Tablet Take 1 Tablet (10 mg total) by mouth Every night    oxygen (O2) Inhalation gas Administer 4 L/min into affected nostril(s) continuous    pantoprazole (PROTONIX) 40 mg Oral Tablet, Delayed Release (E.C.) TAKE 1 TABLET (40 MG TOTAL) BY MOUTH TWICE A DAY 30 MINTUTES BEFORE MEALS FOR 90 DAYS    rosuvastatin (CRESTOR) 10 mg Oral Tablet Take 1 Tablet (10 mg total) by mouth Every evening    rosuvastatin  (CRESTOR) 5 mg Oral Tablet Take 2 Tablets (10 mg total) by mouth Every evening    sertraline (ZOLOFT) 100 mg Oral Tablet Take 2 Tablets (200 mg total) by mouth Once a day for 30 days    traZODone (DESYREL) 50 mg Oral Tablet Take 1 Tablet (50 mg total) by mouth Every night Unsure dosage       Objective   BP 132/72 (Site: Right Arm, Patient Position: Sitting, Cuff Size: Adult)   Pulse 67   Resp (!) 22   Ht 1.753 m (5\' 9" )   Wt 113 kg (249 lb)   SpO2 91%   BMI 36.77 kg/m       General:  appears in good health, comfortable  Lungs:  Breathing nonlabored, rales bibasilar  Cardiovascular:  regular rate and rhythm, S1, S2 normal, no murmur, click, rub or gallop  Abdomen:  non-distended, Soft, non-tender, Bowel sounds normal  Extremities:  extremities normal, atraumatic, no cyanosis or edema    Data Reviewed  Lab Results   Component Value Date    CHOLESTEROL 161 04/13/2023    HDLCHOL 32 (L) 04/13/2023    LDLCHOL 114 (H) 04/13/2023    TRIG 74 04/13/2023        Assessment and Plan  Problem List Items Addressed  This Visit          Respiratory    Chronic obstructive pulmonary disease (CMS HCC) (Chronic)       Digestive    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 Dyslipidemia on Crestor 10 mg daily refill provided     Nicotine dependency not motivated to quit.      I have reviewed vitals, flowsheets, screening tests and visit documentation of annual wellness visit and concur with clinical staff on their assessments. I have examined the patient and discussed the findings with the patient/family. Answered all their questions and concerns.  Patient/family have been advised about complaince with age appropriate screening tests and vaccinations. Counseled regarding healthy diet/habits/activities .  Risk factors were discussed and plan of care explained . Written prevention plan completed/updated and given to patient. Relevant  education materials given.  Follow-up as needed/documented in chart.    Robin Searing, MD      Robin Searing, MD

## 2023-07-14 ENCOUNTER — Encounter (INDEPENDENT_AMBULATORY_CARE_PROVIDER_SITE_OTHER): Payer: Self-pay | Admitting: Medical

## 2023-07-14 ENCOUNTER — Ambulatory Visit: Payer: MEDICAID | Attending: Medical | Admitting: Medical

## 2023-07-14 VITALS — BP 136/70 | Ht 68.0 in | Wt 247.0 lb

## 2023-07-14 DIAGNOSIS — K7030 Alcoholic cirrhosis of liver without ascites: Secondary | ICD-10-CM | POA: Insufficient documentation

## 2023-07-14 DIAGNOSIS — K2270 Barrett's esophagus without dysplasia: Secondary | ICD-10-CM | POA: Insufficient documentation

## 2023-07-14 NOTE — Progress Notes (Signed)
GASTROENTEROLOGY, [facility] [address]  [place] [address]  Operated by [facility]     Name: Hector [name] MRN:  [id number]   Date: 07/14/2023 Age: 62 y.o.     Chief Complaint: Follow Up and Cirrhosis (Seen in clinic 04/08/2023, HX of Barretts, pt reports taking Protonix and seems to relieve his symptoms, pt reports occasionally he has lower right sided abd pain, reports noticing blood in his urine this morning )       Subjective:   Patient presents for follow up regarding cirrhosis. Patient is not currently taking diuretics    He has some right mid abdominal pain that comes and goes for a few days. Pain is not affected by bowel movements or oral intake. Confusion is not as bad since right carotid surgery. He is on Plavix now. He is getting scheduled now for left carotid procedure. He is still smoking. Nausea is less. Weight is stable. DYspnea is improving. Bowels move 3-4 times per day. He had small amount of blood in stool within this last month and infrequently.  His wife reports blood in urine today.     Past Medical History  Current Outpatient Medications   Medication Sig    albuterol sulfate (VENTOLIN HFA) 90 mcg/actuation Inhalation oral inhaler Take 2 Puffs by inhalation Every 6 hours as needed for Other (SHORTNESS OF BREATH,COUGH OR WHEEZING)    aspirin (ECOTRIN) 81 mg Oral Tablet, Delayed Release (E.C.) Take 1 Tablet (81 mg total) by mouth Once a day    clopidogreL (PLAVIX) 75 mg Oral Tablet Take 1 Tablet (75 mg total) by mouth Once a day for 180 days    fluticasone propion-salmeteroL (ADVAIR HFA) 230-21 mcg/actuation Inhalation oral inhaler Take 2 Puffs by inhalation Twice daily    ipratropium-albuterol 0.5 mg-3 mg(2.5 mg base)/3 mL Solution for Nebulization NEBULIZE 1 VIAL 3 TIMES A DAY (8 HOURS APART) AS NEEDED FOR COUGH, SHORTNESS OF BREATH, WHEEZING    OLANZapine (ZYPREXA) 10 mg Oral Tablet Take 1 Tablet (10 mg total) by mouth Every night    oxygen (O2) Inhalation gas  Administer 4 L/min into affected nostril(s) continuous    pantoprazole (PROTONIX) 40 mg Oral Tablet, Delayed Release (E.C.) TAKE 1 TABLET (40 MG TOTAL) BY MOUTH TWICE A DAY 30 MINTUTES BEFORE MEALS FOR 90 DAYS    rosuvastatin (CRESTOR) 10 mg Oral Tablet Take 1 Tablet (10 mg total) by mouth Every evening    rosuvastatin (CRESTOR) 5 mg Oral Tablet Take 2 Tablets (10 mg total) by mouth Every evening (Patient not taking: Reported on 07/14/2023)    sertraline (ZOLOFT) 100 mg Oral Tablet Take 2 Tablets (200 mg total) by mouth Once a day for 30 days    traZODone (DESYREL) 50 mg Oral Tablet Take 1 Tablet (50 mg total) by mouth Every night Unsure dosage     Past Medical History:   Diagnosis Date    Alcohol abuse     Carotid stenosis     Chronic obstructive airway disease (CMS HCC)     Cirrhosis (CMS HCC)     Closed fracture of transverse process of thoracic vertebra (CMS HCC) 05/01/2021    Coma (CMS HCC)     after fall due to alcholism May 2022    Delirium, withdrawal, alcoholic (CMS HCC)     Depression     Encephalopathy 05/15/2021    Esophageal reflux     HTN (hypertension)     Hyperlipidemia     Ileus (CMS  HCC) 05/18/2021    Left pulmonary contusion 05/01/2021    Leukopenia 05/18/2021    Multiple rib fractures 05/01/2021    Oxygen dependent     Pleural effusion 05/11/2021    Serum ammonia increased (CMS HCC) 05/15/2021    Shortness of breath     Spleen injury 05/01/2021    Status post thoracentesis 05/19/2021    Thrombocytopenia (CMS HCC) 05/01/2021    Unknown cause of injury     fx nose    Wears glasses          Social History     Socioeconomic History    Marital status: Divorced   Tobacco Use    Smoking status: Every Day     Current packs/day: 1.00     Average packs/day: 1 pack/day for 50.6 years (50.6 ttl pk-yrs)     Types: Cigarettes     Start date: 1974    Smokeless tobacco: Never    Tobacco comments:     THE PATIENT STARTED    Vaping Use    Vaping status: Never Used   Substance and Sexual Activity    Alcohol use: Not  Currently     Alcohol/week: 15.0 standard drinks of alcohol     Types: 15 Cans of beer per week     Comment:  may 17th, 2022     Drug use: Yes     Types: Marijuana     Comment:  a joint or 2 a day     Sexual activity: Yes   Other Topics Concern    Ability to Walk 1 Flight of Steps without SOB/CP No    Ability To Do Own ADL's Yes    Uses Walker No    Uses Cane No     Social Determinants of Health     Financial Resource Strain: Low Risk  (06/29/2023)    Financial Resource Strain     SDOH Financial: No   Transportation Needs: Low Risk  (06/29/2023)    Transportation Needs     SDOH Transportation: No   Social Connections: Low Risk  (06/29/2023)    Social Connections     SDOH Social Isolation: 5 or more times a week   Recent Concern: Social Connections - Medium Risk (06/29/2023)    Social Connections     SDOH Social Isolation: 3 to 5 times a week   Intimate Partner Violence: Low Risk  (06/29/2023)    Intimate Partner Violence     SDOH Domestic Violence: No   Housing Stability: Low Risk  (06/29/2023)    Housing Stability     SDOH Housing Situation: I have housing.     SDOH Housing Worry: No        ROS: see HPI    Objective:    BP 136/70   Ht 1.727 m (5\' 8" )   Wt 112 kg (247 lb)   BMI 37.56 kg/m     General: A&Ox3, no acute distress  HEENT: sclera anicteric  Heart: RRR, no murmurs  Lungs: clear, no wheezing or rhonchi  Abdomen:  soft, bowel sounds present x 4, mild diffuse tenderness, fluid wave not appreciated  Extremities: 1-2+ lower extremity edema  Psych: no depression/anxiety    Labs:   Ref Range & Units 3 mo ago 9 mo ago 10 mo ago   AFP TUMOR MARKER  <=9 ng/mL 4 6CM 5CM       Radiology:   Recent Results (from the past [id number] hour(s))   US LIVER  Collection Time: 05/08/23  9:41 AM    Narrative    Hector [name]    RADIOLOGIST: Valda Favia, MD    US LIVER performed on 05/08/2023 9:41 AM    CLINICAL HISTORY: K70.30: Alcoholic cirrhosis of liver without ascites (CMS HCC).  alcoholic cirrrhosis of the liver  without ascites    COMPARISON:  None.    FINDINGS:  Liver: Coarsened hepatic echotexture with a nodular liver contour suggestive of cirrhosis. There is hepatofugal flow of the main portal vein.    Gallbladder:  No stones, sludge, wall thickening or tenderness.    Common bile duct:  Normal measuring  4.2 mm.    Pancreas: Visualized portions are unremarkable.  The distal body and tail are obscured by bowel gas.    Visualized portions of the right kidney are unremarkable.  No right upper quadrant ascites.        Impression    1. CIRRHOSIS WITH HEPATOFUGAL FLOW OF THE MAIN PORTAL VEIN.  2. NO ASCITES.        Radiologist location ID: [id number]     [place] RT UPPER QUADRANT    Collection Time: 10/15/22 10:09 AM    Narrative    Hector Loft LEE [place]burn    RADIOLOGIST: Markus Jarvis, MD    [place] RT UPPER QUADRANT performed on 10/15/2022 10:09 AM    CLINICAL HISTORY: K70.30: Alcoholic cirrhosis, unspecified whether ascites present (CMS HCC).  Cirrhosis.    COMPARISON:  None.    FINDINGS:  Liver: Nodular and course.    Gallbladder:  Multiple echogenic gallstones are identified.      Common bile duct:  Normal measuring  3.5 mm.    Pancreas: Obscured by bowel gas.    Visualized portions of the right kidney are unremarkable.  No right upper quadrant ascites.        Impression    THERE ARE FINDINGS CONSISTENT WITH HISTORY OF CIRRHOSIS. NO FOCAL ADENOPATHY IS IDENTIFIED.    CHOLELITHIASIS.        Radiologist location ID: [id number]       Pathology:No results found for this or any previous visit (from the past 720 hour(s)).     Problem List Items Addressed This Visit       Cirrhosis of liver without ascites (CMS HCC) - Primary (Chronic)     Other Visit Diagnoses       Barrett esophagus                 Plan:   The patient was seen independently by myself with Dr. Alphonzo Severance available for consultation.    cirrhosis  - etiology- alcohol  - MELD- 8 based on labs dated 10/15/22. Will repeat in November  - ascites- not appreciated. Low salt  diet. Avoid NSAIDs  - encephalopathy- patient and wife reports some increased confusion at last visit but this has since improved. Last ammonia level 47 in April.  May start lactulose and titrate to 3-4 bowel movements if indicated  - PHTN- last EGD 09/30/22.Will schedule when he is able to hold plavix  - HCC screening- last imaging [place] 05/08/2023- no lesions. last AFP normal 04/13/2023. Repeat every 6 months. Will re-order today  - immunizations- patient reports completing these through PCP     2. colon cancer surveillance  - reported minor bleeding last month. Will follow. Advised to call our office if worsening  - done 08/26/21- fair prep and multiple polyps.   - repeat due but he is now on Plavix. Will  schedule when able to be held     3. Barretts esophagus  - EGD 09/30/22- positive Barretts, no dysplasia  - continue Protonix b.i.d. for now  - Tobacco cessation counseling performed > 3 minutes    4. abdominal pain x 3-4 days  - not related to bowel movements or oral intake  - worse with motion. Possibly muscle strain    Return to Clinic: Return in about 6 months (around 01/14/2024).    Consuelo Pandy, PA-C     DISCLAIMER: This note was partially created using voice recognition software which is subject to errors that may escape proof reading. Please pardon mistakes made by MModal. If typographical errors are noted and/or phrasing does not make sense, please contact me for corrections. I reserve the right to change note to correct mistakes related to MModal misinterpretation.

## 2023-07-14 NOTE — Patient Instructions (Signed)
call with any worsening symptoms

## 2023-07-21 ENCOUNTER — Telehealth (HOSPITAL_COMMUNITY): Payer: Self-pay | Admitting: VASCULAR SURGERY

## 2023-07-22 ENCOUNTER — Other Ambulatory Visit: Payer: Self-pay

## 2023-07-22 ENCOUNTER — Inpatient Hospital Stay
Admission: RE | Admit: 2023-07-22 | Discharge: 2023-07-22 | Disposition: A | Discharge status: Home / Self Care | Payer: MEDICAID | Source: Ambulatory Visit | Attending: VASCULAR SURGERY | Admitting: VASCULAR SURGERY

## 2023-07-22 DIAGNOSIS — I6521 Occlusion and stenosis of right carotid artery: Secondary | ICD-10-CM | POA: Insufficient documentation

## 2023-07-23 DIAGNOSIS — I6523 Occlusion and stenosis of bilateral carotid arteries: Secondary | ICD-10-CM

## 2023-07-23 DIAGNOSIS — I6502 Occlusion and stenosis of left vertebral artery: Secondary | ICD-10-CM

## 2023-07-29 ENCOUNTER — Other Ambulatory Visit: Payer: Self-pay

## 2023-07-29 ENCOUNTER — Encounter (INDEPENDENT_AMBULATORY_CARE_PROVIDER_SITE_OTHER): Payer: Self-pay | Admitting: VASCULAR SURGERY

## 2023-07-29 ENCOUNTER — Ambulatory Visit (INDEPENDENT_AMBULATORY_CARE_PROVIDER_SITE_OTHER): Payer: MEDICAID | Admitting: VASCULAR SURGERY

## 2023-07-29 VITALS — BP 137/68 | HR 70 | Ht 68.0 in | Wt 249.0 lb

## 2023-07-29 DIAGNOSIS — I6523 Occlusion and stenosis of bilateral carotid arteries: Secondary | ICD-10-CM

## 2023-07-29 NOTE — Progress Notes (Signed)
[facility]   OUTPATIENT CLINIC PROGRESS NOTE    Date: 07/29/2023  Patient: Hector [name]  DOB: [birth year]  PCP: Robin Searing, MD    CHIEF COMPLAINT:   Chief Complaint   Patient presents with    Follow Up     Right TCAR 06/29/23  Carotid duplex 07/22/23       HPI:   Hector [name] is a 62 y.o. White male who presents for postop follow-up of right TCAR on June 29, 2023. He is doing well and has no complaints. He denies any postprocedural symptoms of amaurosis, speech disturbance, facial droop or unilateral extremity paresthesia or weakness. He continues to take his outpatient regimen of aspirin, Plavix and a statin.    The patient denies any new changes in PMHx, PSxH, Social Hx, FHx or current medications.     REVIEW OF SYSTEMS:  Constitutional: negative for fevers, chills, sweats and fatigue  Eyes: negative for blurred vision or field defects  HEENT: negative for hearing loss  Respiratory: negative for cough and shortness of breath  Cardiovascular: negative for chest pain, negative for claudication  Gastrointestinal: negative for post prandial pain  Genitourinary: negative for dysuria, continues to urinate  Musculoskeletal: As in HPI  Neurological: negative for unilateral weakness or monocular blindness  Integumentary: negative for wounds or ulcers    PHYSICAL EXAM:  Vitals: BP 137/68 (Site: Left Arm, Patient Position: Sitting)   Pulse 70   Ht 1.727 m (5\' 8" )   Wt 113 kg (249 lb)   SpO2 94%   BMI 37.86 kg/m       General: AA&O X3. Nontoxic.  Well developed and well nourished in no acute distress   HENT: Head is normocephalic, atraumatic   Eyes: Pupils equal and round and reactive to light; conjunctiva clear. Sclera without icterus; EOMO grossly intact.    Neck: Normal ROM, Supple, symmetrical  Lungs: Effort normal, clear to auscultation bilaterally.   Cardiovascular: Heart regular rate and rhythm, no murmur, rub or gallop  Abdomen: Bowel sounds normal; soft, non distended non-tender to palpation,  no rebound or guarding present. No palpable masses.  Extremities: no cyanosis or edema  Skin:  Skin warm and dry    Vascular:      His right neck incision is well healed.    Right carotid artery: 2+ (normal) Left carotid artery: 2+ (normal)   Right brachial artery: 2+ (normal) Left brachial artery: 2+ (normal)   Right radial artery:  2+ (normal) Left radial artery: 2+ (normal)     DIAGNOSTIC STUDIES REVIEWED:  I have independently reviewed and interpreted available imaging.  My interpretations are summarized below.    I personally reviewed his post procedural carotid duplex images performed on July 22, 2023.  The right carotid stent is widely patent without any evidence of stenosis.  The left internal carotid artery has a less than 50% stenosis.  The left vertebral artery is occluded.  Widely patent right vertebral artery.    ASSESSMENT:  Hector [name] is doing very well following a right TCAR for high-grade asymptomatic disease.    PLAN:  We will plan to see him back in the office in 6 months with repeat carotid studies.    The patient was given the opportunity to ask questions and those questions were answered to their satisfaction. Instructed to call with any further questions or concerns.     Lillia Mountain., MD  [facility]

## 2023-07-30 ENCOUNTER — Telehealth (INDEPENDENT_AMBULATORY_CARE_PROVIDER_SITE_OTHER): Payer: Self-pay | Admitting: INTERNAL MEDICINE

## 2023-07-30 MED ORDER — SODIUM,POTASSIUM,MAG SULFATES 17.5 GRAM-3.13 GRAM-1.6 GRAM ORAL SOLN
1.0000 | Freq: Two times a day (BID) | ORAL | 0 refills | Status: AC
Start: 2023-07-30 — End: 2023-08-01

## 2023-07-30 NOTE — Telephone Encounter (Signed)
Nasr tickler, call to pt to schedule colon for hx of polyps and fair prep with Dr. Alphonzo Severance, 2 year recommended repeat form 08/26/2021, pt scheduled on 09/17/23 @ 1050am, instructions for SUPREP sent to pt via MyChart, RX to [facility], HP NAR, BMI 37.8, pt confirms no GLP1, fax to Dr. Alger Memos for pt to hold Plavix 7 days prior

## 2023-07-30 NOTE — Telephone Encounter (Signed)
Suprep RX needs sent to [facility] pharmacy.Cherie Dark, RN

## 2023-08-05 ENCOUNTER — Encounter (INDEPENDENT_AMBULATORY_CARE_PROVIDER_SITE_OTHER): Payer: Self-pay

## 2023-08-25 ENCOUNTER — Telehealth (INDEPENDENT_AMBULATORY_CARE_PROVIDER_SITE_OTHER): Payer: Self-pay | Admitting: INTERNAL MEDICINE

## 2023-08-25 NOTE — Telephone Encounter (Addendum)
Patient is scheduled for colonoscopy on 09/17/23. Spoke to [place] from Dr. Alger Memos and patient must stay on anticoagulant until after 10/09/23 d/t intervention. Procedure will need to be canceled and rescheduled at a later date.Cherie Dark, RN

## 2023-08-25 NOTE — Telephone Encounter (Signed)
This patient actually needs both EGD and colonoscopy when he was able to hold thinners. Refer to last office visit for reasoning. I did not schedule him after I had seen him in clinic. It looks like he was a recall from [facility] after I had seen him. Please schedule for both on a date after he is able to hold.  Thanks    Consuelo Pandy, PA-C

## 2023-08-27 NOTE — Telephone Encounter (Signed)
R/s to 10/19/23 @ 1050.  Hector [name]

## 2023-09-03 ENCOUNTER — Encounter: Payer: 59 | Admitting: Neurology

## 2023-09-13 ENCOUNTER — Other Ambulatory Visit (INDEPENDENT_AMBULATORY_CARE_PROVIDER_SITE_OTHER): Payer: Self-pay | Admitting: Medical

## 2023-09-14 NOTE — Telephone Encounter (Signed)
Refill request for Protonix. Patient seen in clinic 7/30. EGD/Colon scheduled 11/14. Has office f/u 01/14/24.Cherie Dark, RN

## 2023-09-15 ENCOUNTER — Other Ambulatory Visit (INDEPENDENT_AMBULATORY_CARE_PROVIDER_SITE_OTHER): Payer: Self-pay | Admitting: Medical

## 2023-09-20 ENCOUNTER — Other Ambulatory Visit: Payer: Self-pay | Admitting: Internal Medicine

## 2023-09-25 ENCOUNTER — Other Ambulatory Visit (INDEPENDENT_AMBULATORY_CARE_PROVIDER_SITE_OTHER): Payer: Self-pay | Admitting: Nurse Practitioner

## 2023-09-25 DIAGNOSIS — F17210 Nicotine dependence, cigarettes, uncomplicated: Secondary | ICD-10-CM

## 2023-09-25 DIAGNOSIS — Z122 Encounter for screening for malignant neoplasm of respiratory organs: Secondary | ICD-10-CM

## 2023-10-06 NOTE — Progress Notes (Unsigned)
[facility] [address] [place], [place] [address] [phone]  Date:  10/07/2023   Name:  Curtis Luna   DOB:  May 25, 1961   MRN:  [id number]  PCP:  Pearline Cables, MD    Chief Complaint: Numbness (He think this is secondary to positioning when lying down at night. He has had recent xrays and MRI, has seen Neurology. Scheduled for  Nerve conduction study on 12/03/23. /Flu shot today: declines)   History of Present Illness:  Curtis Luna is a 62 y.o. very pleasant male patient who presents with the following:  Patient seen today with concern of arms and legs going numb during the night while he is laying in bed This does not occur during the day, he has not noted any particular weakness. Most recent visit with myself was in May when he had upper extremity numbness He does have history of coronary artery disease, ocular migraine, GERD, hyperlipidemia   He was actually seen by neurology recently for this issue.  Visit in July for bilateral upper and lower extremity paresthesias-it looks like they plan to get nerve conduction studies-patient confirms these are scheduled for December Assessment/Plan:  From elbows, radiates to digit  4-5. He has elbow pain. He sleeps with his elbows bent. Happens more at night. From the elbows to the 4-5th digits. No weakness. His legs also fallsasleep when he sits too long relieved by getting up. His foot, either, falls asleep when he thinks of it ismore when he crosses his legs. Discussed ulnar neuropathy and his +tinel's sign at the elbows and showed images online of ulnar neuropathy. Also discussed crossing his legs and he agreed it happens when sitting or bending or crossing and his foot falls asleep may be peroneal neuropathy again educated him and showed images online of peroneal neuropathy at the knee. We discussed emg/ncs. No other focal neurologic deficits, associated symptoms, inciting  events or modifiable factors. 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 Lowers unlikely peripheral polyneuropathy or lumbar radiculopathy but will check for peroneal neuropathy.   I did get lumbar and cervical spine MRI in April of this year.  Lumbar MRI was basically noncontributory. C spine MRI 4/30 as follows, we went over them together FINDINGS:  No prevertebral soft tissue swelling/edema. Mild edematous endplate signal changes at C5-C6 and C7-T1, nonspecific but likely degenerative in etiology. The cervical spinal cord is normal in signal intensity. 1 mm anterolisthesis of C7 on T1. The vertebral body heights are maintained. Mild/moderate multilevel intervertebral disc height loss. C2-3: No significant spinal canal or neural foraminal stenosis.  C3-4: Left paracentral posterior disc osteophyte complex contributes to mild spinal canal stenosis. No significant neural foraminal stenosis.  C4-5: Uncovertebral hypertrophy contributes to moderate left and mild right neural foraminal stenosis. No significant spinal canal stenosis.  C5-6: Central posterior disc osteophyte complex contributes to mild spinal canal stenosis. Uncovertebral hypertrophy contributes to mild bilateral neural foraminal stenosis.  C6-7: No significant spinal canal or neural foraminal stenosis.  C7-T1: Anterolisthesis and facet arthrosis contribute to mild/moderate bilateral neural foraminal stenosis. No significant spinal canal stenosis.     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   Seasonal affective disorder (HCC) 10/02/2014   Internal hemorrhoid  04/20/2014   Chronic low back pain 04/20/2014   Anxiety 04/20/2014   Esophageal reflux 04/04/2011   Diverticulosis of colon 04/04/2011    Past Medical History:  Diagnosis Date   Abdominal pain, right lower quadrant    Acute bronchospasm    Allergy    Anxiety    on and off   Backache, unspecified    Chicken pox    Diverticulosis of colon (without mention of hemorrhage)    Esophageal reflux    External hemorrhoids without mention of complication    thrombosed   Hiatal hernia    Hyperlipidemia    no medicines    Inguinal hernia without mention of obstruction or gangrene, unilateral or unspecified, (not specified as recurrent)    Migraine    Neuropathy    Obstructive sleep apnea (adult) (pediatric)    uses oral appliance   Other symptoms involving digestive system(787.99)    Pain in joint, forearm    Pain in joint, shoulder region    Seasonal allergies    Sleep apnea    no cpap but uses dental device    Thoracic or lumbosacral neuritis or radiculitis, unspecified     Past Surgical History:  Procedure Laterality Date   COLON RESECTION  10/06   sigmoid colon   COLONOSCOPY     INGUINAL HERNIA REPAIR  12/2012   Left   UPPER GASTROINTESTINAL ENDOSCOPY     WISDOM TOOTH EXTRACTION      Social History   Tobacco Use   Smoking status: Former    Current packs/day: 0.00    Types: Cigarettes, Cigars    Quit date: 10/04/1987    Years since quitting: 36.0   Smokeless tobacco: Never  Vaping Use   Vaping status: Never Used  Substance Use Topics   Alcohol use: Yes    Alcohol/week: 14.0 standard drinks of alcohol    Types: 14 Standard drinks or equivalent per week    Comment: 2 daily    Drug use: No    Family History  Problem Relation Age of Onset   Lung cancer Mother 30       Deceased   Other Father        MVA   Aneurysm Sister        Brain   Healthy Brother        x2   Heart disease Paternal Grandmother    Heart attack Paternal Grandmother    Allergies  Daughter        x2   Migraines Daughter        x1   Diabetes Maternal Aunt    Neuropathy Other    Colon cancer Neg Hx    Colon polyps Neg Hx    Esophageal cancer Neg Hx    Rectal cancer Neg Hx    Stomach cancer Neg Hx    Pancreatic cancer Neg Hx    Allergic rhinitis Neg Hx    Angioedema Neg Hx    Asthma Neg Hx    Atopy Neg Hx    Eczema Neg Hx    Immunodeficiency Neg Hx    Urticaria Neg Hx     Allergies  Allergen Reactions   Escitalopram Hives    Pt felt anxious  Pt felt his skin was "on fire"   Protonix [Pantoprazole Sodium] Other (See Comments)    Tingling of  extremities    Medication list has been reviewed and updated.  Current Outpatient Medications on File Prior to Visit  Medication Sig Dispense Refill   Ascorbic Acid (VITAMIN C) 1000 MG tablet Take 1,000 mg by mouth daily.     b complex vitamins tablet Take 1 tablet by mouth daily. Every other day     cholecalciferol (VITAMIN D3) 25 MCG (1000 UT) tablet Take 1,000 Units by mouth daily.     Famotidine (PEPCID PO) Take by mouth as needed.     gabapentin (NEURONTIN) 100 MG capsule Take 1 capsule (100 mg total) by mouth 3 (three) times daily. (Patient taking differently: Take by mouth. Takes 100 mg in the morning and 300 mg at night) 270 capsule 3   LORazepam (ATIVAN) 1 MG tablet Take 1 tablet (1 mg total) by mouth as needed for anxiety. May take 1 per day 30 tablet 0   rosuvastatin (CRESTOR) 20 MG tablet Take 1 tablet (20 mg total) by mouth daily. 90 tablet 0   No current facility-administered medications on file prior to visit.    Review of Systems:  As per HPI- otherwise negative.   Physical Examination: Vitals:   10/07/23 1117  BP: 124/80  Pulse: 74  Resp: 18  Temp: 97.8 F (36.6 C)  SpO2: 97%   Vitals:   10/07/23 1117  Weight: 150 lb 6.4 oz (68.2 kg)  Height: 5\' 4"  (1.626 m)   Body mass index is 25.82 kg/m. Ideal Body Weight: Weight in (lb) to have BMI = 25: 145.3  GEN: no acute distress.   Normal weight, looks well HEENT: Atraumatic, Normocephalic.  Ears and Nose: No external deformity. CV: RRR, No M/G/R. No JVD. No thrill. No extra heart sounds. PULM: CTA B, no wheezes, crackles, rhonchi. No retractions. No resp. distress. No accessory muscle use. ABD: S, NT, ND, +BS. No rebound. No HSM. EXTR: No c/c/e PSYCH: Normally interactive. Conversant.  Normal strength and sensation of all limbs.  Normal gait  Assessment and Plan: Numbness and tingling in both hands - Plan: Ambulatory referral to Neurosurgery  Paresthesias - Plan: Ambulatory referral to Neurosurgery  Patient seen today with concern of numbness and tingling in his arms and legs which typically occurs when he is laying down in bed at night.  He has noted this since February, so about 8 months now.  He has seen neurology and has nerve conduction studies pending.  He is also completed cervical and lumbar spine MRI.  I advised patient we are not sure if this is due to a medical nerve disorder or if there could be something to his cervical spine findings.  We can certainly have him consult with neurosurgery-I have placed a referral.  He will obtain a copy of his recent MRI scans on a CD for their review  He will let me know if anything is changing or getting worse in the meantime  Signed Abbe Amsterdam, MD

## 2023-10-07 ENCOUNTER — Ambulatory Visit (INDEPENDENT_AMBULATORY_CARE_PROVIDER_SITE_OTHER): Payer: 59 | Admitting: Family Medicine

## 2023-10-07 VITALS — BP 124/80 | HR 74 | Temp 97.8°F | Resp 18 | Ht 64.0 in | Wt 150.4 lb

## 2023-10-07 DIAGNOSIS — R202 Paresthesia of skin: Secondary | ICD-10-CM | POA: Diagnosis not present

## 2023-10-07 DIAGNOSIS — R200 Anesthesia of skin: Secondary | ICD-10-CM | POA: Diagnosis not present

## 2023-10-07 NOTE — Patient Instructions (Addendum)
It was good to see you today- I will work on getting you in with neurosurgery.  They would love to have a copy of your MRI images on CD if you can get this!    Please let me know if anything is changing or getting worse in the meantime   Recommend routine immunizations such as flu and shingles- newer research shows that getting your shingles vaccine is likely associated with reduced dementia risk as well

## 2023-10-14 ENCOUNTER — Ambulatory Visit
Admission: RE | Admit: 2023-10-14 | Discharge: 2023-10-14 | Disposition: A | Discharge status: Home / Self Care | Payer: MEDICAID | Source: Ambulatory Visit | Attending: Medical | Admitting: Medical

## 2023-10-14 ENCOUNTER — Other Ambulatory Visit: Payer: Self-pay

## 2023-10-14 DIAGNOSIS — K7030 Alcoholic cirrhosis of liver without ascites: Secondary | ICD-10-CM | POA: Insufficient documentation

## 2023-10-20 ENCOUNTER — Ambulatory Visit: Payer: MEDICAID | Attending: Internal Medicine | Admitting: Internal Medicine

## 2023-10-20 ENCOUNTER — Other Ambulatory Visit: Payer: Self-pay

## 2023-10-20 ENCOUNTER — Encounter (INDEPENDENT_AMBULATORY_CARE_PROVIDER_SITE_OTHER): Payer: Self-pay | Admitting: Internal Medicine

## 2023-10-20 VITALS — BP 116/60 | HR 64 | Resp 24 | Ht 68.0 in | Wt 243.0 lb

## 2023-10-20 DIAGNOSIS — K7460 Unspecified cirrhosis of liver: Secondary | ICD-10-CM | POA: Insufficient documentation

## 2023-10-20 DIAGNOSIS — R319 Hematuria, unspecified: Secondary | ICD-10-CM | POA: Insufficient documentation

## 2023-10-20 DIAGNOSIS — R350 Frequency of micturition: Secondary | ICD-10-CM | POA: Insufficient documentation

## 2023-10-20 DIAGNOSIS — Z23 Encounter for immunization: Secondary | ICD-10-CM | POA: Insufficient documentation

## 2023-10-20 DIAGNOSIS — F17200 Nicotine dependence, unspecified, uncomplicated: Secondary | ICD-10-CM | POA: Insufficient documentation

## 2023-10-20 MED ORDER — TAMSULOSIN 0.4 MG CAPSULE
0.4000 mg | ORAL_CAPSULE | Freq: Every evening | ORAL | 2 refills | Status: DC
Start: 2023-10-20 — End: 2024-01-13

## 2023-10-20 NOTE — Patient Instructions (Signed)
Take Flomax at night     Continue other meds

## 2023-10-20 NOTE — Progress Notes (Signed)
INTERNAL MEDICINE, [facility]  [address]  [place] [address]  Operated by [facility]  Progress Note    Name: Hector [name] MRN:  [id number]   Date: 10/20/2023 DOB:  12/01/1961 (62 y.o.)             Reason for Visit: Follow Up 3 Months (Patient here with significant other for 3 month follow up, patient has had several episodes of blood in urine, has been clear x one week, )    Subjective  Hector [name] is a 62 y.o. year old male who comes to clinic for follow up his main complaint is increased urinary frequency and nocturia no pain he did not mention any blood in his urine to me.      Denies any abdominal pain or distention     No cough or wheezing     Denies any lower extremity edema  Current Outpatient Medications   Medication Sig    albuterol sulfate (VENTOLIN HFA) 90 mcg/actuation Inhalation oral inhaler Take 2 Puffs by inhalation Every 6 hours as needed for Other (SHORTNESS OF BREATH,COUGH OR WHEEZING)    aspirin (ECOTRIN) 81 mg Oral Tablet, Delayed Release (E.C.) Take 1 Tablet (81 mg total) by mouth Once a day    clopidogreL (PLAVIX) 75 mg Oral Tablet Take 1 Tablet (75 mg total) by mouth Once a day for 180 days    fluticasone propion-salmeteroL (ADVAIR HFA) 230-21 mcg/actuation Inhalation oral inhaler Take 2 Puffs by inhalation Twice daily    ipratropium-albuterol 0.5 mg-3 mg(2.5 mg base)/3 mL Solution for Nebulization NEBULIZE 1 VIAL 3 TIMES A DAY (8 HOURS APART) AS NEEDED FOR COUGH, SHORTNESS OF BREATH, WHEEZING    OLANZapine (ZYPREXA) 10 mg Oral Tablet Take 1 Tablet (10 mg total) by mouth Every night    oxygen (O2) Inhalation gas Administer 4 L/min into affected nostril(s) continuous    pantoprazole (PROTONIX) 40 mg Oral Tablet, Delayed Release (E.C.) TAKE 1 TABLET (40 MG TOTAL) BY MOUTH TWICE A DAY 30 MINTUTES BEFORE MEALS FOR 90 DAYS    rosuvastatin (CRESTOR) 10 mg Oral Tablet Take 1 Tablet (10 mg total) by mouth Every evening    sertraline (ZOLOFT) 100 mg Oral Tablet Take 2 Tablets (200 mg  total) by mouth Once a day for 30 days    tamsulosin (FLOMAX) 0.4 mg Oral Capsule Take 1 Capsule (0.4 mg total) by mouth Every evening after dinner    traZODone (DESYREL) 50 mg Oral Tablet Take 1 Tablet (50 mg total) by mouth Every night Unsure dosage       Objective   BP 116/60 (Site: Right Arm, Patient Position: Sitting, Cuff Size: Adult)   Pulse 64   Resp (!) 24   Ht 1.727 m (5\' 8" )   Wt 110 kg (243 lb)   SpO2 92%   BMI 36.95 kg/m       General:  appears in good health, comfortable  Neck:  no thyromegaly or lymphadenopathy  Lungs:  Breathing nonlabored, Clear to auscultation bilaterally.   Cardiovascular:  regular rate and rhythm, S1, S2 normal, no murmur, click, rub or gallop  systolic murmur: holosystolic 2/6, buzzing at 2nd left intercostal space  Abdomen:  non-distended, Soft, non-tender  Psychiatric:  Normal affect, behavior, memory, thought content, judgement, and speech.    Data Reviewed  Lab Results   Component Value Date    CHOLESTEROL 161 04/13/2023    HDLCHOL 32 (L) 04/13/2023    LDLCHOL 114 (H) 04/13/2023    TRIG  74 04/13/2023      COMPREHENSIVE METABOLIC PANEL   Lab Results   Component Value Date    SODIUM 141 06/30/2023    POTASSIUM 4.0 06/30/2023    CHLORIDE 112 (H) 06/30/2023    CO2 24 06/30/2023    ANIONGAP 5 06/30/2023    BUN 12 06/30/2023    CREATININE 0.86 06/30/2023    GLUCOSENF 103 06/30/2023    CALCIUM 8.4 06/30/2023    PHOSPHORUS 4.2 05/24/2021    ALBUMIN 3.7 04/13/2023    TOTALPROTEIN 7.2 04/13/2023    ALKPHOS 144 (H) 04/13/2023    AST 49 04/13/2023    ALT 34 04/13/2023    BILIRUBINCON 0.0 05/13/2021         CBC  Diff   Lab Results   Component Value Date/Time    WBC 3.4 (L) 06/30/2023 05:53 AM    HGB 12.4 (L) 06/30/2023 05:53 AM    HCT 36.0 06/30/2023 05:53 AM    PLTCNT 64 (L) 06/30/2023 05:53 AM    RBC 3.91 (L) 06/30/2023 05:53 AM    MCV 91.9 06/30/2023 05:53 AM    MCHC 34.5 06/30/2023 05:53 AM    MCH 31.7 06/30/2023 05:53 AM    RDW 16.2 (H) 06/30/2023 05:53 AM    MPV 8.9  06/30/2023 05:53 AM    Lab Results   Component Value Date/Time    PMNS 64 04/13/2023 12:16 PM    LYMPHOCYTES 24 04/13/2023 12:16 PM    EOSINOPHIL 2 04/13/2023 12:16 PM    MONOCYTES 9 04/13/2023 12:16 PM    BASOPHILS 1 04/13/2023 12:16 PM    BASOPHILS 0.00 04/13/2023 12:16 PM    PMNABS 3.10 04/13/2023 12:16 PM    LYMPHSABS 1.20 04/13/2023 12:16 PM    EOSABS 0.10 04/13/2023 12:16 PM    MONOSABS 0.50 04/13/2023 12:16 PM          Assessment and Plan  Problem List Items Addressed This Visit          Respiratory    Tobacco use disorder       Nephrology    Hematuria - Primary    Relevant Orders    URINALYSIS WITH REFLEX MICROSCOPIC AND CULTURE IF POSITIVE       Digestive    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 Obstructive urinary symptoms probably secondary to BPH also mentioned to the nurse that he has hematuria am going to get urine try Flomax 0.4 mg once daily.  Call if not better     2. Cirrhosis of the liver stable follow up with gastroenterology as scheduled     3. Status post carotid endarterectomy, still smokes smoking cessation again discussed not motivated to stop     4. Behavioral/mental issues seeing a mental health specialist follow-up as scheduled         Robin Searing, MD

## 2023-10-28 ENCOUNTER — Other Ambulatory Visit: Payer: Self-pay

## 2023-10-28 ENCOUNTER — Ambulatory Visit (INDEPENDENT_AMBULATORY_CARE_PROVIDER_SITE_OTHER): Payer: MEDICAID | Admitting: Nurse Practitioner

## 2023-10-28 ENCOUNTER — Encounter (INDEPENDENT_AMBULATORY_CARE_PROVIDER_SITE_OTHER): Payer: Self-pay | Admitting: Nurse Practitioner

## 2023-10-28 VITALS — BP 118/62 | HR 70 | Temp 97.3°F | Resp 18 | Ht 68.0 in | Wt 244.0 lb

## 2023-10-28 DIAGNOSIS — F1290 Cannabis use, unspecified, uncomplicated: Secondary | ICD-10-CM

## 2023-10-28 DIAGNOSIS — Z6837 Body mass index (BMI) 37.0-37.9, adult: Secondary | ICD-10-CM

## 2023-10-28 DIAGNOSIS — Z7185 Encounter for immunization safety counseling: Secondary | ICD-10-CM

## 2023-10-28 DIAGNOSIS — Z716 Tobacco abuse counseling: Secondary | ICD-10-CM

## 2023-10-28 DIAGNOSIS — J449 Chronic obstructive pulmonary disease, unspecified: Secondary | ICD-10-CM

## 2023-10-28 DIAGNOSIS — F17210 Nicotine dependence, cigarettes, uncomplicated: Secondary | ICD-10-CM

## 2023-10-28 DIAGNOSIS — R0609 Other forms of dyspnea: Secondary | ICD-10-CM

## 2023-10-28 DIAGNOSIS — Z9981 Dependence on supplemental oxygen: Secondary | ICD-10-CM

## 2023-10-28 MED ORDER — IPRATROPIUM 0.5 MG-ALBUTEROL 3 MG (2.5 MG BASE)/3 ML NEBULIZATION SOLN
3.0000 mL | INHALATION_SOLUTION | Freq: Three times a day (TID) | RESPIRATORY_TRACT | 1 refills | Status: DC | PRN
Start: 2023-10-28 — End: 2023-12-24

## 2023-10-28 MED ORDER — FLUTICASONE PROPIONATE 230 MCG-SALMETEROL 21 MCG/ACTUATION HFA INHALER
2.0000 | INHALATION_SPRAY | Freq: Two times a day (BID) | RESPIRATORY_TRACT | 5 refills | Status: DC
Start: 2023-10-28 — End: 2024-04-26

## 2023-10-28 MED ORDER — ALBUTEROL SULFATE HFA 90 MCG/ACTUATION AEROSOL INHALER
2.0000 | INHALATION_SPRAY | Freq: Four times a day (QID) | RESPIRATORY_TRACT | 5 refills | Status: DC | PRN
Start: 2023-10-28 — End: 2024-04-26

## 2023-10-28 NOTE — Progress Notes (Signed)
PULMONOLOGY, [facility]  [phone]. [place] [address]      Pulmonary Clinic Follow Up    Patient Name: Hector [name]  Date: 10/28/2023  Department:  PULMONOLOGY, [facility]  MRN: [id number]  DOB: 09/20/1961     6 MONTH FOLLOW-UP ENCOUNTER FOR COPD    History of Present Illness:  (10/28/2023)  Hector [name] is a 62 year old male who presents for a 6 month follow-up encounter for COPD management.  The patient reports no worsening of shortness of breath in the past 6 months, daily dry cough with occasional mucus production, inspiratory wheezing,.  The patient states he currently takes his DuoNeb treatments 3 times a day use of Advair HFA 2 inhalations twice a day with required use of albuterol rescue inhaler 2 times per day oxygen supplement used 4 Liters per nasal cannula at night with benefit.  The patient endorses he continues to smoke 1 pack of small cigars per day and really does not desired to stopped smoking uses marijuana as well daily.  The patient remarks that his current respiratory medication is benefiting him and he does not feel a need to change anything,has been feeling good recently,lost 6 pounds.     History of Present Illness: (04/21/2023) Hector [name] is a 62 year old male who presents to the Pulmonary Clinic for a 6 month follow-up visit for COPD,nicotine dependence.  The patient has a past medical history of alcohol abuse, chronic obstructive pulmonary disease, cirrhosis, after fall related to alcoholism (04/2021), delirium withdrawal from alcohol, depression, hypertension, nose fracture, nicotine dependence.    The patient admits that he increased his smoking to one pack cigarettes daily,two marijuana joints,uses oxygen 4 liters per nasal cannula continuously,Advair HFA 231-52mcg two puffs twice a day and short acting bronchodilator Albuterol twice a day,nebulizer treatment as needed,last treatment 2 days ago.    The patient remarks that  he has shortness of breath upon exertion,no worse in the past 6 month,productive cough in the evening dime size of thick clear mucus with inspiratory wheezing.   Pulmonary Function Test ( September 10, 2021 Forced vital capacity 77% of predicted mild reduction, FEV1 78% of predicted mild reduction in FEV1 FVC ratio 75.91 within normal limits noted, reduction in diffusion 59% no gross evidence of obstructive lung disease, no hyperinflation, no significant improvement in the FEV1 with a bronchodilator no restrictive lung disease.   Low Dose CT of Chest for lung cancer screening (11/12/2022):  Negative results annual image (11/13/2023).      Medications    Current Outpatient Medications:     albuterol sulfate (VENTOLIN HFA) 90 mcg/actuation Inhalation oral inhaler, Take 2 Puffs by inhalation Every 6 hours as needed for Other (SHORTNESS OF BREATH,COUGH OR WHEEZING), Disp: 1 Each, Rfl: 5    aspirin (ECOTRIN) 81 mg Oral Tablet, Delayed Release (E.C.), Take 1 Tablet (81 mg total) by mouth Once a day, Disp: , Rfl:     clopidogreL (PLAVIX) 75 mg Oral Tablet, Take 1 Tablet (75 mg total) by mouth Once a day for 180 days, Disp: 90 Tablet, Rfl: 1    fluticasone propion-salmeteroL (ADVAIR HFA) 230-21 mcg/actuation Inhalation oral inhaler, Take 2 Puffs by inhalation Twice daily RINSE MOUTH WITH WATER AFTER INHALER USE, Disp: 1 g, Rfl: 5    ipratropium-albuterol 0.5 mg-3 mg(2.5 mg base)/3 mL Solution for Nebulization, Take 3 mL by nebulization Three times a day as needed for Wheezing, Disp: 270 mL, Rfl: 1  OLANZapine (ZYPREXA) 10 mg Oral Tablet, Take 1 Tablet (10 mg total) by mouth Every night, Disp: , Rfl:     oxygen (O2) Inhalation gas, Administer 4 L/min into affected nostril(s) continuous, Disp: , Rfl:     pantoprazole (PROTONIX) 40 mg Oral Tablet, Delayed Release (E.C.), TAKE 1 TABLET (40 MG TOTAL) BY MOUTH TWICE A DAY 30 MINTUTES BEFORE MEALS FOR 90 DAYS, Disp: 60 Tablet, Rfl: 3    rosuvastatin (CRESTOR) 10 mg Oral Tablet,  Take 1 Tablet (10 mg total) by mouth Every evening, Disp: 90 Tablet, Rfl: 1    sertraline (ZOLOFT) 100 mg Oral Tablet, Take 2 Tablets (200 mg total) by mouth Once a day for 30 days, Disp: 30 Tablet, Rfl: 0    tamsulosin (FLOMAX) 0.4 mg Oral Capsule, Take 1 Capsule (0.4 mg total) by mouth Every evening after dinner, Disp: 30 Capsule, Rfl: 2    traZODone (DESYREL) 50 mg Oral Tablet, Take 1 Tablet (50 mg total) by mouth Every night Unsure dosage, Disp: , Rfl:   Allergies  Allergies   Allergen Reactions    Bee Venom Protein (Honey Bee)      Bee stings      Vitals  Vitals:    10/28/23 1256   BP: 118/62   Pulse: 70   Resp: 18   Temp: 36.3 C (97.3 F)   TempSrc: Temporal   SpO2: 94%   Weight: 111 kg (244 lb)   Height: 1.727 m (5\' 8" )   BMI: 37.18        There are no exam notes on file for this visit.   Past Medical History  Past Medical History:   Diagnosis Date    Alcohol abuse     Carotid stenosis     Chronic obstructive airway disease (CMS HCC)     Cirrhosis (CMS HCC)     Closed fracture of transverse process of thoracic vertebra (CMS HCC) 05/01/2021    Coma (CMS HCC)     after fall due to alcholism May 2022    Delirium, withdrawal, alcoholic (CMS HCC)     Depression     Encephalopathy 05/15/2021    Esophageal reflux     HTN (hypertension)     Hyperlipidemia     Ileus (CMS HCC) 05/18/2021    Left pulmonary contusion 05/01/2021    Leukopenia 05/18/2021    Multiple rib fractures 05/01/2021    Oxygen dependent     Pleural effusion 05/11/2021    Serum ammonia increased (CMS HCC) 05/15/2021    Shortness of breath     Spleen injury 05/01/2021    Status post thoracentesis 05/19/2021    Thrombocytopenia (CMS HCC) 05/01/2021    Unknown cause of injury     fx nose    Wears glasses          Past Surgical History:   Procedure Laterality Date    COLONOSCOPY  08/2021    COLONOSCOPY WITH COLD SNARE POLYPECTOMY N/A 08/26/2021    Performed by Lowella Petties, MD at [facility] OR ENDO    ESOPHAGOGASTRODUODENOSCOPY  08/2021    GASTROSCOPY WITH  BIOPSY N/A 09/30/2022    Performed by Lowella Petties, MD at [facility] OR ENDO    GASTROSCOPY WITH BIOPSY N/A 08/26/2021    Performed by Lowella Petties, MD at [facility] OR ENDO    HX OTHER Right 06/29/2023    TC[facility]    HX TONSILLECTOMY      OTHER SURGICAL HISTORY  HX of plastic surgery to right head    [facility] - IMAGING ONLY Right 06/29/2023    Performed by Redlinger, Richard, MD at [facility]      Family Medical History:       Problem Relation (Age of Onset)    Asthma Father    Cerebral Aneurysm Sister    Dementia Mother    Heart Attack Paternal Grandmother (15)            Social History     Socioeconomic History    Marital status: Significant Other   Tobacco Use    Smoking status: Every Day     Current packs/day: 1.00     Average packs/day: 1 pack/day for 50.9 years (50.9 ttl pk-yrs)     Types: Cigarettes     Start date: 35    Smokeless tobacco: Never    Tobacco comments:     THE PATIENT STARTED    Vaping Use    Vaping status: Never Used   Substance and Sexual Activity    Alcohol use: Not Currently     Alcohol/week: 15.0 standard drinks of alcohol     Types: 15 Cans of beer per week     Comment:  may 17th, 2022     Drug use: Yes     Types: Marijuana     Comment:  a joint or 2 a day     Sexual activity: Yes   Other Topics Concern    Ability to Walk 1 Flight of Steps without SOB/CP No    Ability To Do Own ADL's Yes    Uses Walker No    Uses Cane No     Social Determinants of Health     Financial Resource Strain: Low Risk  (06/29/2023)    Financial Resource Strain     SDOH Financial: No   Transportation Needs: Low Risk  (06/29/2023)    Transportation Needs     SDOH Transportation: No   Social Connections: Low Risk  (06/29/2023)    Social Connections     SDOH Social Isolation: 5 or more times a week   Recent Concern: Social Connections - Medium Risk (06/29/2023)    Social Connections     SDOH Social Isolation: 3 to 5 times a week   Intimate Partner Violence: Low Risk  (06/29/2023)    Intimate Partner Violence     SDOH Domestic  Violence: No   Housing Stability: Low Risk  (06/29/2023)    Housing Stability     SDOH Housing Situation: I have housing.     SDOH Housing Worry: No      Outpatient Medications:  Current Outpatient Medications   Medication Sig    albuterol sulfate (VENTOLIN HFA) 90 mcg/actuation Inhalation oral inhaler Take 2 Puffs by inhalation Every 6 hours as needed for Other (SHORTNESS OF BREATH,COUGH OR WHEEZING)    aspirin (ECOTRIN) 81 mg Oral Tablet, Delayed Release (E.C.) Take 1 Tablet (81 mg total) by mouth Once a day    clopidogreL (PLAVIX) 75 mg Oral Tablet Take 1 Tablet (75 mg total) by mouth Once a day for 180 days    fluticasone propion-salmeteroL (ADVAIR HFA) 230-21 mcg/actuation Inhalation oral inhaler Take 2 Puffs by inhalation Twice daily RINSE MOUTH WITH WATER AFTER INHALER USE    ipratropium-albuterol 0.5 mg-3 mg(2.5 mg base)/3 mL Solution for Nebulization Take 3 mL by nebulization Three times a day as needed for Wheezing    OLANZapine (ZYPREXA) 10 mg Oral Tablet Take  1 Tablet (10 mg total) by mouth Every night    oxygen (O2) Inhalation gas Administer 4 L/min into affected nostril(s) continuous    pantoprazole (PROTONIX) 40 mg Oral Tablet, Delayed Release (E.C.) TAKE 1 TABLET (40 MG TOTAL) BY MOUTH TWICE A DAY 30 MINTUTES BEFORE MEALS FOR 90 DAYS    rosuvastatin (CRESTOR) 10 mg Oral Tablet Take 1 Tablet (10 mg total) by mouth Every evening    sertraline (ZOLOFT) 100 mg Oral Tablet Take 2 Tablets (200 mg total) by mouth Once a day for 30 days    tamsulosin (FLOMAX) 0.4 mg Oral Capsule Take 1 Capsule (0.4 mg total) by mouth Every evening after dinner    traZODone (DESYREL) 50 mg Oral Tablet Take 1 Tablet (50 mg total) by mouth Every night Unsure dosage       Review of system:  General:  No fever, chills, fatigue,Intentional weight loss in 6 months, appetite change.  No cold sweats at night.  Cardiac:  No chest pain, pressure, palpitations, hands or ankle edema.  Respiratory:  Positive shortness of breath upon  exertion,Productive evening cough,Inspiratory  wheezing.  No hemoptysis.  No snoring, gasping for air, nocturnal cough. No witnessed apnea.  Neurological:  No headache or dizziness.  HEENT:  No visual changes, Positive nasal congestion and post nasal drainage.  No neck stiffness, sore throat or swollen glands.  GI:  No dysphagia, nausea, vomiting, constipation, diarrhea.  No blood in stool.  Urinary:  No blood in urine.  Skin:  No rash.    PHYSICAL EXAMINATION:   Constitutional:  Patient alert and oriented, no acute distress, no comfortable appearing.Obese,odor of strong cigarette smoke  General appearance of the patient   HEENT:  Head is normocephalic, atraumatic.  Tympanic membranes are easily visualized , transparent and pearly gray in color with normal light reflex noted bilaterally.  Nasal turbinates are pink and moist without inflammation or erythema. Nasal vestibule free of rhinorrhea.   Nasal vestibule shows evidence of nicotine stain mucosa  No tenderness upon palpation of sinus areas. Oral mucosa is pink and moist without lesions, no pharyngeal erythremia or exudate noted.  Trachea midline Neck supple without masses, no lymphadenopathy.  No jugular vein distention.  No thyromegaly.   Cardiovascular:   Apical 72 regular rate, rhythm, normal S1, S2 heard, no Murmurs, rubs, or gallops auscultated.  No carotid bruit.  No ankle edema.  Lungs:  Auscultation anterior and posterior, non labored respirations,no, rales or rhonchi, Thorax is symmetrical, chest walls expand equally, no pain upon palpation of upper anterior thorax, expirations not prolonged, no intercostal retractions.  Effort good, No dullness upon percussion.  Abdomen: Abdomen soft, obese non-distended, non-tender.  Musc: Normal gait and station, no digital cyanosis or clubbing.  Skin:  Color flesh tone.  Skin warm and dry.  No ecchymosis seen, nail beds pink, no clubbing, capillary refill less than four seconds, no rash, lesion or ulcers.   Psych:   Normal affect, interaction, and speech, cooperative, informative, pleasant.    Assessment:  (J44.9) Chronic obstructive pulmonary disease, unspecified COPD type (CMS HCC)  (primary encounter diagnosis)  Plan: albuterol sulfate (VENTOLIN HFA) 90         mcg/actuation Inhalation oral inhaler,         fluticasone propion-salmeteroL (ADVAIR HFA)         230-21 mcg/actuation Inhalation oral inhaler,         ipratropium-albuterol 0.5 mg-3 mg(2.5 mg         base)/3 mL Solution  for Nebulization    (F17.210) Cigarette smoker    (F12.90) Marijuana smoker    (Z71.6) Tobacco abuse counseling    (R06.09) Dyspnea on exertion    (Z99.81) Dependence on supplemental oxygen    (Z71.85) Immunization counseling         Plan:    COPD,effectively controlled with current cigarette and marijuana smoker  Again, smoking cessation discussed with the patient,risk of lung disease progression,potential cardiac events,patient desires to smoke daily   [facility],patient declines order  Continue current maintenance inhaler Advair 230-109mcg 2 puffs twice a day,increase Duoneb treatments to 3 times a day  Continue oxygen 4 liters per nasal cannula oxygen,patient benefits from oxygen therapy  The patient is up-to-date with Flu vaccine received (10/20/2023),Pneumovax received (07/13/2023).     The patient was given the opportunity to ask questions and those questions were answered to the patient's satisfaction. The patient was encouraged to call with any additional questions or concerns.   Discussed with patient effects and side effects of medications. Medication safety was discussed  Detailed time spent for preparation of today's encounter,review of documents, interviewing/examining the patient,providing education/counselling,completing electronic orders and progress notes for the patient's plan of care.      Electronically signed by Loretta Plume, CFNP  Pulmonary

## 2023-10-29 ENCOUNTER — Ambulatory Visit (HOSPITAL_COMMUNITY): Payer: MEDICAID

## 2023-10-29 ENCOUNTER — Ambulatory Visit
Admission: RE | Admit: 2023-10-29 | Discharge: 2023-10-29 | Disposition: A | Discharge status: Home / Self Care | Payer: MEDICAID | Source: Ambulatory Visit | Attending: INTERNAL MEDICINE | Admitting: INTERNAL MEDICINE

## 2023-10-29 ENCOUNTER — Encounter (HOSPITAL_COMMUNITY)
Admission: RE | Disposition: A | Discharge status: Home / Self Care | Payer: Self-pay | Source: Ambulatory Visit | Attending: INTERNAL MEDICINE

## 2023-10-29 ENCOUNTER — Encounter (HOSPITAL_COMMUNITY): Payer: Self-pay | Admitting: INTERNAL MEDICINE

## 2023-10-29 DIAGNOSIS — Z860100 Personal history of colon polyps, unspecified: Secondary | ICD-10-CM | POA: Insufficient documentation

## 2023-10-29 DIAGNOSIS — J449 Chronic obstructive pulmonary disease, unspecified: Secondary | ICD-10-CM | POA: Insufficient documentation

## 2023-10-29 DIAGNOSIS — K640 First degree hemorrhoids: Secondary | ICD-10-CM | POA: Insufficient documentation

## 2023-10-29 DIAGNOSIS — K635 Polyp of colon: Secondary | ICD-10-CM | POA: Insufficient documentation

## 2023-10-29 DIAGNOSIS — E785 Hyperlipidemia, unspecified: Secondary | ICD-10-CM | POA: Insufficient documentation

## 2023-10-29 DIAGNOSIS — Z7902 Long term (current) use of antithrombotics/antiplatelets: Secondary | ICD-10-CM | POA: Insufficient documentation

## 2023-10-29 DIAGNOSIS — I071 Rheumatic tricuspid insufficiency: Secondary | ICD-10-CM | POA: Insufficient documentation

## 2023-10-29 DIAGNOSIS — D120 Benign neoplasm of cecum: Secondary | ICD-10-CM | POA: Insufficient documentation

## 2023-10-29 DIAGNOSIS — F32A Depression, unspecified: Secondary | ICD-10-CM | POA: Insufficient documentation

## 2023-10-29 DIAGNOSIS — Z1211 Encounter for screening for malignant neoplasm of colon: Secondary | ICD-10-CM | POA: Insufficient documentation

## 2023-10-29 DIAGNOSIS — R011 Cardiac murmur, unspecified: Secondary | ICD-10-CM | POA: Insufficient documentation

## 2023-10-29 DIAGNOSIS — K7460 Unspecified cirrhosis of liver: Secondary | ICD-10-CM | POA: Insufficient documentation

## 2023-10-29 DIAGNOSIS — K5730 Diverticulosis of large intestine without perforation or abscess without bleeding: Secondary | ICD-10-CM | POA: Insufficient documentation

## 2023-10-29 DIAGNOSIS — Z6836 Body mass index (BMI) 36.0-36.9, adult: Secondary | ICD-10-CM | POA: Insufficient documentation

## 2023-10-29 DIAGNOSIS — I10 Essential (primary) hypertension: Secondary | ICD-10-CM | POA: Insufficient documentation

## 2023-10-29 DIAGNOSIS — R0602 Shortness of breath: Secondary | ICD-10-CM | POA: Insufficient documentation

## 2023-10-29 DIAGNOSIS — K219 Gastro-esophageal reflux disease without esophagitis: Secondary | ICD-10-CM | POA: Insufficient documentation

## 2023-10-29 DIAGNOSIS — E669 Obesity, unspecified: Secondary | ICD-10-CM | POA: Insufficient documentation

## 2023-10-29 DIAGNOSIS — R1310 Dysphagia, unspecified: Secondary | ICD-10-CM | POA: Insufficient documentation

## 2023-10-29 DIAGNOSIS — I2510 Atherosclerotic heart disease of native coronary artery without angina pectoris: Secondary | ICD-10-CM | POA: Insufficient documentation

## 2023-10-29 DIAGNOSIS — F17210 Nicotine dependence, cigarettes, uncomplicated: Secondary | ICD-10-CM | POA: Insufficient documentation

## 2023-10-29 SURGERY — COLONOSCOPY WITH POLYPECTOMY
Anesthesia: Monitor Anesthesia Care | Wound class: Clean Contaminated Wounds-The respiratory, GI, Genital, or urinary

## 2023-10-29 MED ORDER — LIDOCAINE (PF) 100 MG/5 ML (2 %) INTRAVENOUS SYRINGE
INJECTION | Freq: Once | INTRAVENOUS | Status: DC | PRN
Start: 2023-10-29 — End: 2023-10-29
  Administered 2023-10-29: 60 mg via INTRAVENOUS

## 2023-10-29 MED ORDER — LACTATED RINGERS INTRAVENOUS SOLUTION
INTRAVENOUS | Status: DC
Start: 2023-10-29 — End: 2023-10-29

## 2023-10-29 MED ORDER — SODIUM CHLORIDE 0.9 % (FLUSH) INJECTION SYRINGE
10.0000 mL | INJECTION | Freq: Three times a day (TID) | INTRAMUSCULAR | Status: DC
Start: 2023-10-29 — End: 2023-10-29

## 2023-10-29 MED ORDER — SODIUM CHLORIDE 0.9 % (FLUSH) INJECTION SYRINGE
10.0000 mL | INJECTION | INTRAMUSCULAR | Status: DC | PRN
Start: 2023-10-29 — End: 2023-10-29

## 2023-10-29 MED ORDER — LACTATED RINGERS INTRAVENOUS SOLUTION
INTRAVENOUS | Status: DC | PRN
Start: 2023-10-29 — End: 2023-10-29

## 2023-10-29 MED ORDER — PROPOFOL 10 MG/ML INTRAVENOUS EMULSION
INTRAVENOUS | Status: DC | PRN
Start: 2023-10-29 — End: 2023-10-29
  Administered 2023-10-29: 100 ug/kg/min via INTRAVENOUS
  Administered 2023-10-29: 0 ug/kg/min via INTRAVENOUS

## 2023-10-29 MED ORDER — PROPOFOL 10 MG/ML IV BOLUS
INJECTION | Freq: Once | INTRAVENOUS | Status: DC | PRN
Start: 2023-10-29 — End: 2023-10-29
  Administered 2023-10-29: 100 mg via INTRAVENOUS

## 2023-10-29 MED ORDER — MIDAZOLAM 1 MG/ML INJECTION WRAPPER
2.0000 mg | Freq: Once | INTRAMUSCULAR | Status: DC | PRN
Start: 2023-10-29 — End: 2023-10-29

## 2023-10-29 SURGICAL SUPPLY — 28 items
ADAPTER CATH 11/32IN 1/8-3/8IN PLASTIC LL SYRG CONN POS GRIP RIDGE STRL LF  3/32IN TAPER (UROLOGICAL SUPPLIES) ×1
BASIN EME 8.4X3.8X2IN GRAD DISP DST ROSE POLYPROP C500ML LF (MED SURG SUPPLIES) IMPLANT
BASKET SPEC RETR 190CMX20MM 3.7MM FLOWERBASKETV C HOOK 8WR GW STRL DISP (ENDOSCOPIC SUPPLIES) IMPLANT
CAN SUCT 1200CC LF (MED SURG SUPPLIES) IMPLANT
DUPE USE ITEM 153319 - CLIP LGT RSL 360 ULTRA 235CM BRD ROT CONTROL KNOB 17MM OPN (ENDOSCOPIC SUPPLIES) IMPLANT
DUPE USE ITEM 162462 - ADAPTER CATH 11/32IN 1/8-3/8IN PLASTIC LL SYRG CONN POS GRIP RIDGE STRL LF  3/32IN TAPER (UROLOGICAL SUPPLIES) ×1 IMPLANT
ELECTRODE PATIENT RTN 9FT VLAB C30- LB RM PHSV ACRL FOAM CORD NONIRRITATE NONSENSITIZE ADH STRP (SURGICAL CUTTING SUPPLIES) IMPLANT
FORCEPS BIOPSY HOT 240CM 2.2MM RJ 4 +2.8MM DISP (ENDOSCOPIC SUPPLIES)
FORCEPS BIOPSY HOT 240CM 2.2MM RJ 4 +2.8MM DISPO (ENDOSCOPIC SUPPLIES) IMPLANT
FORCEPS BIOPSY MICROMESH TTH STREAMLINE CATH 240CM 2.4MM RJ (ENDOSCOPIC SUPPLIES) IMPLANT
FORCEPS BIOPSY MICROMESH TTH STREAMLINE CATH NEEDLE 240CM (ENDOSCOPIC SUPPLIES) IMPLANT
FORCEPS BIOPSY NEEDLE 240CM 2.2MM RJ 4 2.8MM STD CPC STRL (ENDOSCOPIC SUPPLIES) IMPLANT
GOWN PROTECT XL BLU FULL BCK HKLP NK KNITCUFF SMS 49X30IN 21IN (GLOVES AND ACCESSORIES) ×2 IMPLANT
KIT DECOMPRESS 175CM 14FR 6FR .035IN COLON 10 DRAIN SDPRT CATH GW STRL DISP (MED SURG SUPPLIES) IMPLANT
KIT ENDOS CMPLN ENDOKIT ORCAPOD 4 2 END 1.1OZ (ENDOSCOPIC SUPPLIES) ×1 IMPLANT
MANIFLD SUCT NPTN 2 STD 1 PORT WASTE MGMT SYS NONST LF  DISP (MED SURG SUPPLIES) ×1 IMPLANT
NEEDLE SCLRTX 25GA 2.3MM BVL STRL DISP STAR CATH INTJCT 4MM 240CM (ENDOSCOPIC SUPPLIES) IMPLANT
PROBE ESURG 220CM 2.3MM FIAPC FLXB STR FIRE STRL DISP (SURGICAL CUTTING SUPPLIES) IMPLANT
SNARE MICRO 240IN 13MM CAPTIVATR PLPCTM HEX ENDOS STRL DISP (ENDOSCOPIC SUPPLIES) IMPLANT
SNARE RND 240CM 2.4MM CAPTIVATR COLD STF THN WRE ENDOS PLPCTM 10MM DISP (ENDOSCOPIC SUPPLIES) ×1 IMPLANT
SNARE SM OVAL 195CMX13MM 2.4MM TRTQ ROT STAR CATH MED STF ENDOS PLPCTM 2.8MM STRL DISP BLU (ENDOSCOPIC SUPPLIES) IMPLANT
SYRINGE LL 50ML LF  STRL GRAD N-PYRG DEHP-FR PVC FREE MED DISP CLR (MED SURG SUPPLIES) ×1 IMPLANT
TRAP MUCUS 40ML 5IN PLASTIC TRNSPT CAP CONTAINR DISP ADPR SUCT TUBE STRL LF (SPECIMEN COLLECTION SUPPLIES) IMPLANT
TRAP SPECI REM TRPS QD 4 REM CHAMBER SAF SCRN PARABOLA SLOT (ENDOSCOPIC SUPPLIES) ×1 IMPLANT
TUBING SUCT LIGHT BLU 10FT .25IN AMSURE FEMALE CONN BPA FREE COLLAPSE RST FLXB NONST LF  DISP (MED SURG SUPPLIES) ×1 IMPLANT
USE ITEM 343174 SNARE RND 240CM 2.4MM CAPTIVATR COLD STF THN WRE ENDOS PLPCTM 10MM DISP (ENDOSCOPIC SUPPLIES) ×1 IMPLANT
USE ITEM 77895 FORCEPS BIOPSY MICROMESH TTH STREAMLINE CATH NEEDLE 240CM (ENDOSCOPIC SUPPLIES) IMPLANT
VALVE SUCT ORCAPOD ORCA SEAL AIR WATER SIL FREE ENCLOSE SPRG BIOPSY OLMPS 160/180/190 SER AUX PORT (ENDOSCOPIC SUPPLIES) IMPLANT

## 2023-10-29 NOTE — Anesthesia Preprocedure Evaluation (Signed)
ANESTHESIA PRE-OP EVALUATION  Planned Procedure: COLONOSCOPY  DIAGNOSTIC  Review of Systems     anesthesia history negative     patient summary reviewed  nursing notes reviewed        Pulmonary   COPD, shortness of breath, home oxygen, current smoker and Smoked in last 24 hours,   Cardiovascular    Hypertension, valvular problems/murmurs, murmur, CAD, hyperlipidemia and tricuspid regurgitation ,       GI/Hepatic/Renal    Dysphagia  etoh abuse-quit drinking may 2022, GERD and liver disease        Endo/Other    obesity,      Neuro/Psych/MS    depression, Substance use, alcohol     Cancer    negative hematology/oncology ROS,                     Physical Assessment      Airway       Mallampati: III    TM distance: >3 FB    Neck ROM: full  Mouth Opening: good.            Dental           (+) poor dentition, chipped           Pulmonary    Breath sounds clear to auscultation  (-) no rhonchi, no decreased breath sounds, no wheezes, no rales and no stridor     Cardiovascular        (+) murmur present        Other findings              Plan  ASA 4     Planned anesthesia type: MAC                     Additional Plans: Arterial line    Intravenous induction       Anesthetic plan and risks discussed with patient  signed consent obtained

## 2023-10-29 NOTE — Anesthesia Transfer of Care (Signed)
ANESTHESIA TRANSFER OF CARE   Hector [name] is a 62 y.o. ,male, Weight: 112 kg (245 lb 13 oz)   had Procedure(s):  COLONOSCOPY WITH COLD SNARE POLYPECTOMY  performed  10/29/23   Primary Service: Bunnie Philips, MD    Past Medical History:   Diagnosis Date   . Alcohol abuse    . Carotid stenosis    . Chronic obstructive airway disease (CMS HCC)    . Cirrhosis (CMS HCC)    . Closed fracture of transverse process of thoracic vertebra (CMS HCC) 05/01/2021   . Coma (CMS [facility])     after fall due to [facility] May 2022   . Delirium, withdrawal, alcoholic (CMS HCC)    . Depression    . Encephalopathy 05/15/2021   . Esophageal reflux    . HTN (hypertension)    . Hyperlipidemia    . Ileus (CMS HCC) 05/18/2021   . Left pulmonary contusion 05/01/2021   . Leukopenia 05/18/2021   . Multiple rib fractures 05/01/2021   . Oxygen dependent    . Pleural effusion 05/11/2021   . Serum ammonia increased (CMS HCC) 05/15/2021   . Shortness of breath    . Spleen injury 05/01/2021   . Status post thoracentesis 05/19/2021   . Thrombocytopenia (CMS HCC) 05/01/2021   . Unknown cause of injury     fx nose   . Wears glasses       Allergy History as of 10/29/23       BEE VENOM PROTEIN (HONEY BEE)         Noted Status Severity Type Reaction    05/01/21 [address], LPN 25/36/64 Active       Comments: Bee stings                   I completed my transfer of care / handoff to the receiving personnel during which we discussed:  Access, Airway, All key/critical aspects of case discussed, Analgesia, Antibiotics, Expectation of post procedure, Fluids/Product, Gave opportunity for questions and acknowledgement of understanding, Labs and PMHx  Report given to: Hardin Negus, RN    Post Location: PACU                                                           Last OR Temp: Temperature: 36.6 C (97.8 F)  ABG:  PH (ARTERIAL)   Date Value Ref Range Status   05/19/2021 7.43 7.35 - 7.45 Final     PH (T)   Date Value Ref Range Status   05/19/2021 7.42  7.35 - 7.45 Final     PCO2 (ARTERIAL)   Date Value Ref Range Status   05/19/2021 29 (L) 35 - 45 mm/Hg Final     PO2 (ARTERIAL)   Date Value Ref Range Status   05/19/2021 70 (L) 72 - 100 mm/Hg Final     SODIUM   Date Value Ref Range Status   05/19/2021 142 137 - 145 mmol/L Final     POTASSIUM   Date Value Ref Range Status   06/30/2023 4.0 3.5 - 5.1 mmol/L Final     KETONES   Date Value Ref Range Status   05/17/2021 Negative Negative mg/dL Final     WHOLE BLOOD POTASSIUM   Date Value Ref Range Status  05/19/2021 4.3 3.5 - 4.6 mmol/L Final     CHLORIDE   Date Value Ref Range Status   05/19/2021 115 (H) 101 - 111 mmol/L Final     CALCIUM   Date Value Ref Range Status   06/30/2023 8.4 8.4 - 10.2 mg/dL Final     Calculated P Axis   Date Value Ref Range Status   06/03/2023 43 degrees Final     Calculated R Axis   Date Value Ref Range Status   06/03/2023 34 degrees Final     Calculated T Axis   Date Value Ref Range Status   06/03/2023 21 degrees Final     IONIZED CALCIUM   Date Value Ref Range Status   05/19/2021 1.22 1.10 - 1.35 mmol/L Final     LACTATE   Date Value Ref Range Status   05/19/2021 0.8 0.0 - 1.3 mmol/L Final     HEMOGLOBIN   Date Value Ref Range Status   05/19/2021 10.3 (L) 12.0 - 18.0 g/dL Final     OXYHEMOGLOBIN   Date Value Ref Range Status   05/19/2021 92.8 85.0 - 98.0 % Final     CARBOXYHEMOGLOBIN   Date Value Ref Range Status   05/19/2021 2.1 0.0 - 2.5 % Final     MET-HEMOGLOBIN   Date Value Ref Range Status   05/19/2021 1.3 0.0 - 2.0 % Final     BASE EXCESS (ARTERIAL)   Date Value Ref Range Status   05/17/2021 0.1 0.0 - 1.0 mmol/L Final     BASE DEFICIT   Date Value Ref Range Status   05/19/2021 4.2 (H) 0.0 - 3.0 mmol/L Final     BICARBONATE (ARTERIAL)   Date Value Ref Range Status   05/19/2021 21.6 18.0 - 26.0 mmol/L Final     TEMPERATURE, COMP   Date Value Ref Range Status   05/19/2021 37.0   C Final     Airway:* No LDAs found *  Blood pressure (!) 100/44, pulse 70, temperature 36.6 C (97.8 F),  resp. rate 15, height 1.753 m (5\' 9" ), weight 112 kg (245 lb 13 oz), SpO2 94%.

## 2023-10-29 NOTE — H&P (Signed)
[facility]  Surgical History and Physical                                                     Hector [name], Hector [name], 62 y.o. male  Date of Admission:  10/29/2023  Date of Birth:  20-Aug-1961    10/29/2023  Chief Complaint: Procedure(s):  COLONOSCOPY  DIAGNOSTIC      History obtained patient    HPI:  Pt for 3 yr f/u colonoscopy. Hx polyps. Denies any abd pain, ,blood in stool, or + family hx     Allergies   Allergen Reactions    Bee Venom Protein (Honey Bee)      Bee stings     Medications:  Current Outpatient Medications   Medication Instructions    albuterol sulfate (VENTOLIN HFA) 90 mcg/actuation Inhalation oral inhaler 2 Puffs, Inhalation, EVERY 6 HOURS PRN    aspirin (ECOTRIN) 81 mg, Oral, DAILY    clopidogreL (PLAVIX) 75 mg, Oral, DAILY    fluticasone propion-salmeteroL (ADVAIR HFA) 230-21 mcg/actuation Inhalation oral inhaler 2 Puffs, Inhalation, 2 TIMES DAILY, RINSE MOUTH WITH WATER AFTER INHALER USE    ipratropium-albuterol 0.5 mg-3 mg(2.5 mg base)/3 mL Solution for Nebulization 3 mL, Nebulization, 3 TIMES DAILY PRN    OLANZapine (ZYPREXA) 10 mg, Oral, NIGHTLY    oxygen (O2) 4 L/min, Nasal, CONTINUOUS    pantoprazole (PROTONIX) 40 mg Oral Tablet, Delayed Release (E.C.) TAKE 1 TABLET (40 MG TOTAL) BY MOUTH TWICE A DAY 30 MINTUTES BEFORE MEALS FOR 90 DAYS    rosuvastatin (CRESTOR) 10 mg, Oral, EVERY EVENING    sertraline (ZOLOFT) 200 mg, Oral, DAILY    tamsulosin (FLOMAX) 0.4 mg, Oral, EVERY EVENING AFTER DINNER    traZODone (DESYREL) 50 mg, Oral, NIGHTLY, Unsure dosage     Review of systems:   as per HPI  Past Medical History:   Diagnosis Date    Alcohol abuse     Carotid stenosis     Chronic obstructive airway disease (CMS HCC)     Cirrhosis (CMS HCC)     Closed fracture of transverse process of thoracic vertebra (CMS HCC) 05/01/2021    Coma (CMS HCC)     after fall due to alcholism May 2022    Delirium, withdrawal, alcoholic (CMS HCC)     Depression     Encephalopathy 05/15/2021    Esophageal reflux      HTN (hypertension)     Hyperlipidemia     Ileus (CMS HCC) 05/18/2021    Left pulmonary contusion 05/01/2021    Leukopenia 05/18/2021    Multiple rib fractures 05/01/2021    Oxygen dependent     Pleural effusion 05/11/2021    Serum ammonia increased (CMS HCC) 05/15/2021    Shortness of breath     Spleen injury 05/01/2021    Status post thoracentesis 05/19/2021    Thrombocytopenia (CMS HCC) 05/01/2021    Unknown cause of injury     fx nose    Wears glasses          Past Surgical History:   Procedure Laterality Date    CAROTID STENT      COLONOSCOPY  08/2021    ESOPHAGOGASTRODUODENOSCOPY  08/2021    HX OTHER Right 06/29/2023    TCAR    HX TONSILLECTOMY      OTHER SURGICAL HISTORY  HX of plastic surgery to right head         Social History     Socioeconomic History    Marital status: Significant Other     Spouse name: vickie    Number of children: 2    Years of education: 12   Tobacco Use    Smoking status: Every Day     Current packs/day: 1.00     Average packs/day: 1 pack/day for 50.9 years (50.9 ttl pk-yrs)     Types: Cigarettes     Start date: 58    Smokeless tobacco: Never    Tobacco comments:     THE PATIENT STARTED    Vaping Use    Vaping status: Never Used   Substance and Sexual Activity    Alcohol use: Not Currently     Alcohol/week: 15.0 standard drinks of alcohol     Types: 15 Cans of beer per week     Comment:  may 17th, 2022     Drug use: Yes     Types: Marijuana     Comment:  a joint or 2 a day     Sexual activity: Yes   Other Topics Concern    Ability to Walk 1 Flight of Steps without SOB/CP No    Ability To Do Own ADL's Yes    Uses Walker No    Uses Cane No     Social Determinants of Health     Financial Resource Strain: Low Risk  (06/29/2023)    Financial Resource Strain     SDOH Financial: No   Transportation Needs: Low Risk  (06/29/2023)    Transportation Needs     SDOH Transportation: No   Social Connections: Low Risk  (10/29/2023)    Social Connections     SDOH Social Isolation: 5 or more times  a week   Intimate Partner Violence: Low Risk  (06/29/2023)    Intimate Partner Violence     SDOH Domestic Violence: No   Housing Stability: Low Risk  (06/29/2023)    Housing Stability     SDOH Housing Situation: I have housing.     SDOH Housing Worry: No     Family Medical History:       Problem Relation (Age of Onset)    Asthma Father    Cerebral Aneurysm Sister    Dementia Mother    Heart Attack Paternal Grandmother (35)               Examination:  BP 121/67   Pulse 69   Temp 36 C (96.8 F)   Resp (!) 26   Ht 1.753 m (5\' 9" )   Wt 112 kg (245 lb 13 oz)   SpO2 93%   BMI 36.30 kg/m         Physical Exam  Constitutional:       Appearance: Normal appearance. He is obese.      Comments: HOH   HENT:      Head: Normocephalic and atraumatic.      Nose: Nose normal.      Mouth/Throat:      Mouth: Mucous membranes are moist.   Eyes:      Extraocular Movements: Extraocular movements intact.      Pupils: Pupils are equal, round, and reactive to light.   Cardiovascular:      Rate and Rhythm: Normal rate and regular rhythm.      Pulses: Normal pulses.   Pulmonary:  Effort: Pulmonary effort is normal.      Breath sounds: Normal breath sounds.      Comments: 4 liters O2 , rhonchi and wheezes throughout , loose cough  Abdominal:      General: Abdomen is flat. Bowel sounds are normal.      Palpations: Abdomen is soft.   Musculoskeletal:         General: Normal range of motion.      Cervical back: Normal range of motion and neck supple.   Skin:     General: Skin is warm and dry.      Capillary Refill: Capillary refill takes less than 2 seconds.   Neurological:      General: No focal deficit present.      Mental Status: He is oriented to person, place, and time.   Psychiatric:         Mood and Affect: Mood normal.         Behavior: Behavior normal.          Labs: @LABS3M @     Diagnostic Imaging:     No orders to display        Assessment:   Hx polyps    Plan: Patient here today for Procedure(s):  COLONOSCOPY  DIAGNOSTIC  with Surgeon(s):  Bunnie Philips, MD.      Misty Stanley Clipp, PA-C   10/29/2023, 09:54

## 2023-10-29 NOTE — Anesthesia Postprocedure Evaluation (Signed)
Anesthesia Post Op Evaluation    Patient: Hector [name]  Procedure(s):  COLONOSCOPY WITH COLD SNARE POLYPECTOMY    Last Vitals:Temperature: 36.6 C (97.8 F) (10/29/23 1103)  Heart Rate: 74 (10/29/23 1110)  BP (Non-Invasive): 110/60 (10/29/23 1110)  Respiratory Rate: 16 (10/29/23 1110)  SpO2: 94 % (10/29/23 1103)    There were no known notable events for this encounter.               Anesthetic complications: no  Cardiovascular status: acceptable (.RR)  Respiratory status: acceptable  Hydration status: acceptable      Comments:

## 2023-10-30 ENCOUNTER — Encounter (INDEPENDENT_AMBULATORY_CARE_PROVIDER_SITE_OTHER): Payer: Self-pay | Admitting: INTERNAL MEDICINE

## 2023-10-30 DIAGNOSIS — D120 Benign neoplasm of cecum: Secondary | ICD-10-CM

## 2023-10-30 LAB — SURGICAL PATHOLOGY SPECIMEN

## 2023-11-02 ENCOUNTER — Telehealth (INDEPENDENT_AMBULATORY_CARE_PROVIDER_SITE_OTHER): Payer: Self-pay | Admitting: Nurse Practitioner

## 2023-11-02 NOTE — Telephone Encounter (Signed)
11-02-23 LDCT Lung Screening cpt 16109 Guss Bunde [id number] case# [id number] start 11-02-23 exp 02+-16-25  LDCT lung Screening Schedulers notified ok to schedule  They will contact patient directly  Patient due 11-14-23  [facility] Np/Jm

## 2023-11-06 ENCOUNTER — Telehealth (INDEPENDENT_AMBULATORY_CARE_PROVIDER_SITE_OTHER): Payer: Self-pay | Admitting: INTERNAL MEDICINE

## 2023-11-06 NOTE — Telephone Encounter (Signed)
Pt had colon on 10/29/2023, had multiple polyps, now needs scheduled another colon in 3 months for surveillance, Please make sure pt holds Plavix 7 days prior to procedure.Chrisandra Netters, MA

## 2023-11-18 NOTE — Telephone Encounter (Signed)
Pt was called to schedule 3 mo colon pt said call me back and ask me this in a couple days   will have to call back to schedule pt with Dr Alphonzo Severance     Hector [name]

## 2023-11-22 ENCOUNTER — Ambulatory Visit
Admission: RE | Admit: 2023-11-22 | Discharge: 2023-11-22 | Disposition: A | Discharge status: Home / Self Care | Payer: MEDICAID | Source: Ambulatory Visit | Attending: Nurse Practitioner | Admitting: Nurse Practitioner

## 2023-11-22 DIAGNOSIS — Z122 Encounter for screening for malignant neoplasm of respiratory organs: Secondary | ICD-10-CM | POA: Insufficient documentation

## 2023-11-22 DIAGNOSIS — F17210 Nicotine dependence, cigarettes, uncomplicated: Secondary | ICD-10-CM | POA: Insufficient documentation

## 2023-11-25 NOTE — Result Encounter Note (Signed)
1. There is an oval-shaped masslike lesion along the medial basilar aspect of the left lower lobe measuring about 5.7 cm in diameter. This has a swirling appearance with adjacent pleural thickening and is consistent with rounded atelectasis. This is unchanged from 11/12/2022.     2. Emphysematous changes are present with paraseptal bulla. There are are some chronic subpleural linear markings in the lingular segment of the left upper lobe unchanged from 11/12/2022    3. Left-sided pleural thickening is present and stable from the prior exam    4. Mild cardiomegaly with no pericardial effusion. Coronary Artery Calcifications: Present    5. Upper Abdomen: Cirrhotic morphology in the liver with signs of portal hypertension including splenomegaly. Cholelithiasis is present.    Lung RADS Assessment Category: 2- benign     Will repeat lung screening in 1 year.  Encourage smoking cessation and continued adherence to annual lung cancer screenings to reduce risk of developing lung cancer.  Recommend following up with PCP and/or specialist for all other incidental findings as an additional workup or referral may be considered.     Nurse navigator will enter the protocol LDCT order for any future annual or follow up scans if criteria is met.  Patient's are eligible for continued lung cancer screenings covered by their insurance if following criteria is met: 71-34 years of age and either a current smoker or have quit in the last <15 years.  If interested in self pay for 2025, please contact us.  Otherwise, please discuss nodule surveillance with PCP.      PCP, pulm, vascular notifiedand recommend to contact patient should any further work up be advised for non lung findings. Patient notified via mychart.  Encourage patient to call for additional questions/concerns.    Thank you,     Vito Berger, MSN, APRN, FNP-C, NCTTP  [facility]  Lung Cancer Screening & Tobacco Cessation  [address], [place], [place], [place]  [phone]

## 2023-12-03 ENCOUNTER — Ambulatory Visit (INDEPENDENT_AMBULATORY_CARE_PROVIDER_SITE_OTHER): Payer: Self-pay | Admitting: Neurology

## 2023-12-03 ENCOUNTER — Ambulatory Visit (INDEPENDENT_AMBULATORY_CARE_PROVIDER_SITE_OTHER): Payer: 59 | Admitting: Neurology

## 2023-12-03 DIAGNOSIS — Z0289 Encounter for other administrative examinations: Secondary | ICD-10-CM

## 2023-12-03 DIAGNOSIS — G5622 Lesion of ulnar nerve, left upper limb: Secondary | ICD-10-CM | POA: Diagnosis not present

## 2023-12-03 NOTE — Progress Notes (Signed)
Full Name: Curtis Luna Gender: Male MRN #: [id number] Date of Birth: October 16, 1961    Visit Date: 12/03/2023 09:30 Age: 62 Years Examining Physician: Dr. Naomie Dean Referring Physician: Dr. Naomie Dean Height: 5 feet 5 inch  History: elbow pain elbows, radiates to digit  4-5. Leg numbness. Emg/ncs of the bilateral uppers and left leg for peroneal neuropathy vs cervical/lumbar radic. + scratch and collapse on the left upper extremity. Refer to peripheral nerve neurosurgeon Dr. Ernestine Mcmurray in [place] and OT in [place].   Summary: Nerve conductions performed on the bilateral upper extremities and right lower extremity.  The left ulnar nerve (ADM) showed decreased conduction velocity (30 m/s, normal greater than 49, above elbow to below elbow segment) and a 35 m/s drop across the elbow. All remaining nerves (as indicated in the following tables) were within normal limits.  EMG needle study was performed in the left upper extremity: The left flexor digitorum profundus (ulnar head) muscle showed polyphasic motor units, prolonged motor unit duration and diminished motor unit recruitment.  The left first dorsal interosseous muscle and left ADM muscle showed increased motor unit amplitude, prolonged motor unit duration, polyphasic motor units and diminished motor unit recruitment.  All remaining muscles (as indicated in the following tables) were within normal limits.        Conclusion: Nerve conduction studies of the left ulnar motor nerve show a 35 m/s drop across the left elbow.  EMG needle study shows chronic neurogenic changes in ulnar muscles of the left upper extremity.  This is consistent with ulnar neuropathy across the left elbow.  No suggestion of polyneuropathy or cervical radiculopathy.     ------------------------------- Naomie Dean, M.D.  [facility] [address], [address] [place], [place] [address] Tel: [phone] Fax: [phone]  Verbal  informed consent was obtained from the patient, patient was informed of potential risk of procedure, including bruising, bleeding, hematoma formation, infection, muscle weakness, muscle pain, numbness, among others.        MNC    Nerve / Sites Muscle Latency Ref. Amplitude Ref. Rel Amp Segments Distance Velocity Ref. Area    ms ms mV mV %  cm m/s m/s mVms  L Median - APB     Wrist APB 3.3 <=4.4 7.5 >=4.0 100 Wrist - APB 7   29.8     Upper arm APB 7.7  7.1  94 Upper arm - Wrist 24.6 56 >=49 28.7  R Median - APB     Wrist APB 3.4 <=4.4 9.0 >=4.0 100 Wrist - APB 7   36.1     Upper arm APB 7.8  8.7  96.9 Upper arm - Wrist 24 54 >=49 35.6  L Ulnar - ADM     Wrist ADM 2.7 <=3.3 7.0 >=6.0 100 Wrist - ADM 7   31.5     B.Elbow ADM 4.5  7.2  103 B.Elbow - Wrist 12 67 >=49 33.3     A.Elbow ADM 9.1  7.1  97.9 A.Elbow - B.Elbow 15 32 >=49 31.6  R Ulnar - ADM     Wrist ADM 2.8 <=3.3 8.6 >=6.0 100 Wrist - ADM 7   30.0     B.Elbow ADM 4.4  8.1  93.9 B.Elbow - Wrist 11.6 69 >=49 27.6     A.Elbow ADM 7.0  8.1  99.9 A.Elbow - B.Elbow 16 63 >=49 26.4  R Peroneal - EDB     Ankle EDB 4.4 <=6.5 7.5 >=2.0  100 Ankle - EDB 9   28.9     Fib head EDB 10.3  7.1  95 Fib head - Ankle 26 44 >=44 26.7     Pop fossa EDB 12.5  7.1  99.1 Pop fossa - Fib head 10 44 >=44 27.6         Pop fossa - Ankle      R Tibial - AH     Ankle AH 4.5 <=5.8 12.4 >=4.0 100 Ankle - AH 9   53.0     Pop fossa AH 11.5  12.3  99.5 Pop fossa - Ankle 35 50 >=41 50.2  L Ulnar - FDI     Wrist FDI 3.3 <=4.5 9.7 >=7.0 100 Wrist - FDI 8   29.3     B.Elbow FDI 5.5  11.3  117 B.Elbow - Wrist 12 54 >=49 38.3     A.Elbow FDI 10.0  10.4  91.4 A.Elbow - B.Elbow 20 45 >=49 36.0         A.Elbow - Wrist                       SNC    Nerve / Sites Rec. Site Peak Lat Ref.  Amp Ref. Segments Distance Peak Diff Ref.    ms ms V V  cm ms ms  R Sural - Ankle (Calf)     Calf Ankle 3.0 <=4.4 17 >=6 Calf - Ankle 14    R Superficial peroneal - Ankle     Lat  leg Ankle 3.3 <=4.4 12 >=6 Lat leg - Ankle 14    L Median, Ulnar - Transcarpal comparison     Median Palm Wrist 2.4 <=2.2 53 >=35 Median Palm - Wrist 8       Ulnar Palm Wrist 2.3 <=2.2 16 >=12 Ulnar Palm - Wrist 8          Median Palm - Ulnar Palm  0.0 <=0.4  R Median, Ulnar - Transcarpal comparison     Median Palm Wrist 2.2 <=2.2 62 >=35 Median Palm - Wrist 8       Ulnar Palm Wrist 2.1 <=2.2 35 >=12 Ulnar Palm - Wrist 8          Median Palm - Ulnar Palm  0.1 <=0.4  L Median - Orthodromic (Dig II, Mid palm)     Dig II Wrist 3.1 <=3.4 22 >=10 Dig II - Wrist 13    R Median - Orthodromic (Dig II, Mid palm)     Dig II Wrist 3.1 <=3.4 25 >=10 Dig II - Wrist 13    L Ulnar - Orthodromic, (Dig V, Mid palm)     Dig V Wrist 3.1 <=3.1 8 >=5 Dig V - Wrist 11    R Ulnar - Orthodromic, (Dig V, Mid palm)     Dig V Wrist 2.7 <=3.1 10 >=5 Dig V - Wrist 69                       F  Wave    Nerve F Lat Ref.   ms ms  R Tibial - AH 50.1 <=56.0  L Ulnar - ADM 26.7 <=32.0  R Ulnar - ADM 26.1 <=32.0           EMG Summary Table    Spontaneous MUAP Recruitment  Muscle IA Fib PSW Fasc Other Amp Dur. Poly Pattern  L. Cervical paraspinals (low) Normal None None None _______ Normal Normal Normal  Normal  L. Deltoid Normal None None None _______ Normal Normal Normal Normal  L. Triceps brachii Normal None None None _______ Normal Normal Normal Normal  L. Flexor digitorum profundus (Ulnar) Normal None None None _______ Normal Increased 3+ Reduced  L. First dorsal interosseous Normal None None None _______ Increased Increased 3+ Reduced  L. Pronator teres Normal None None None _______ Normal Normal Normal Normal  L. Opponens pollicis Normal None None None _______ Normal Normal Normal Normal  L. Abductor digiti minimi (manus) Normal None None None _______ Increased Increased 4+ Reduced

## 2023-12-05 ENCOUNTER — Other Ambulatory Visit: Payer: Self-pay | Admitting: Family Medicine

## 2023-12-05 DIAGNOSIS — G629 Polyneuropathy, unspecified: Secondary | ICD-10-CM

## 2023-12-06 DIAGNOSIS — G5622 Lesion of ulnar nerve, left upper limb: Secondary | ICD-10-CM | POA: Insufficient documentation

## 2023-12-07 ENCOUNTER — Telehealth: Payer: Self-pay | Admitting: Neurology

## 2023-12-07 NOTE — Telephone Encounter (Signed)
Referral  for occupational therapy fax to [facility]. Phone: [phone], Fax: [phone]

## 2023-12-07 NOTE — Addendum Note (Signed)
Addended by: Naomie Dean B on: 12/07/2023 06:45 PM   Modules accepted: Orders

## 2023-12-11 NOTE — Progress Notes (Signed)
See procedure note.

## 2023-12-17 ENCOUNTER — Other Ambulatory Visit (INDEPENDENT_AMBULATORY_CARE_PROVIDER_SITE_OTHER): Payer: Self-pay | Admitting: VASCULAR SURGERY

## 2023-12-17 DIAGNOSIS — I6523 Occlusion and stenosis of bilateral carotid arteries: Secondary | ICD-10-CM

## 2023-12-20 LAB — HEMOGLOBIN A1C: Hemoglobin A1C: 5.8

## 2023-12-24 ENCOUNTER — Other Ambulatory Visit (INDEPENDENT_AMBULATORY_CARE_PROVIDER_SITE_OTHER): Payer: Self-pay | Admitting: Nurse Practitioner

## 2023-12-24 DIAGNOSIS — J449 Chronic obstructive pulmonary disease, unspecified: Secondary | ICD-10-CM

## 2023-12-28 ENCOUNTER — Other Ambulatory Visit (INDEPENDENT_AMBULATORY_CARE_PROVIDER_SITE_OTHER): Payer: Self-pay | Admitting: Nurse Practitioner

## 2023-12-28 DIAGNOSIS — J449 Chronic obstructive pulmonary disease, unspecified: Secondary | ICD-10-CM

## 2024-01-05 ENCOUNTER — Encounter (HOSPITAL_COMMUNITY): Payer: Self-pay | Admitting: Family Medicine

## 2024-01-05 ENCOUNTER — Other Ambulatory Visit: Payer: Self-pay

## 2024-01-05 ENCOUNTER — Emergency Department (HOSPITAL_COMMUNITY): Payer: MEDICAID

## 2024-01-05 ENCOUNTER — Inpatient Hospital Stay
Admission: EM | Admit: 2024-01-05 | Discharge: 2024-01-09 | DRG: 683 | Disposition: A | Discharge status: Home / Self Care | Payer: MEDICAID | Attending: Family Medicine | Admitting: Family Medicine

## 2024-01-05 DIAGNOSIS — J441 Chronic obstructive pulmonary disease with (acute) exacerbation: Principal | ICD-10-CM | POA: Diagnosis present

## 2024-01-05 DIAGNOSIS — I10 Essential (primary) hypertension: Secondary | ICD-10-CM | POA: Diagnosis present

## 2024-01-05 DIAGNOSIS — F32A Depression, unspecified: Secondary | ICD-10-CM | POA: Diagnosis present

## 2024-01-05 DIAGNOSIS — E860 Dehydration: Secondary | ICD-10-CM | POA: Diagnosis present

## 2024-01-05 DIAGNOSIS — E871 Hypo-osmolality and hyponatremia: Secondary | ICD-10-CM | POA: Diagnosis present

## 2024-01-05 DIAGNOSIS — D6959 Other secondary thrombocytopenia: Secondary | ICD-10-CM | POA: Diagnosis present

## 2024-01-05 DIAGNOSIS — K219 Gastro-esophageal reflux disease without esophagitis: Secondary | ICD-10-CM | POA: Diagnosis present

## 2024-01-05 DIAGNOSIS — D72819 Decreased white blood cell count, unspecified: Secondary | ICD-10-CM | POA: Diagnosis present

## 2024-01-05 DIAGNOSIS — F1010 Alcohol abuse, uncomplicated: Secondary | ICD-10-CM | POA: Diagnosis present

## 2024-01-05 DIAGNOSIS — N390 Urinary tract infection, site not specified: Secondary | ICD-10-CM

## 2024-01-05 DIAGNOSIS — N179 Acute kidney failure, unspecified: Principal | ICD-10-CM | POA: Diagnosis present

## 2024-01-05 DIAGNOSIS — K7460 Unspecified cirrhosis of liver: Secondary | ICD-10-CM

## 2024-01-05 DIAGNOSIS — K7030 Alcoholic cirrhosis of liver without ascites: Secondary | ICD-10-CM | POA: Diagnosis present

## 2024-01-05 DIAGNOSIS — N136 Pyonephrosis: Secondary | ICD-10-CM | POA: Diagnosis present

## 2024-01-05 DIAGNOSIS — Z7902 Long term (current) use of antithrombotics/antiplatelets: Secondary | ICD-10-CM

## 2024-01-05 DIAGNOSIS — R319 Hematuria, unspecified: Secondary | ICD-10-CM

## 2024-01-05 DIAGNOSIS — I491 Atrial premature depolarization: Secondary | ICD-10-CM

## 2024-01-05 DIAGNOSIS — I440 Atrioventricular block, first degree: Secondary | ICD-10-CM | POA: Diagnosis present

## 2024-01-05 DIAGNOSIS — Z79899 Other long term (current) drug therapy: Secondary | ICD-10-CM

## 2024-01-05 DIAGNOSIS — E7800 Pure hypercholesterolemia, unspecified: Secondary | ICD-10-CM | POA: Diagnosis present

## 2024-01-05 DIAGNOSIS — Z9981 Dependence on supplemental oxygen: Secondary | ICD-10-CM

## 2024-01-05 DIAGNOSIS — F17210 Nicotine dependence, cigarettes, uncomplicated: Secondary | ICD-10-CM | POA: Diagnosis present

## 2024-01-05 DIAGNOSIS — F419 Anxiety disorder, unspecified: Secondary | ICD-10-CM | POA: Diagnosis present

## 2024-01-05 DIAGNOSIS — R310 Gross hematuria: Secondary | ICD-10-CM | POA: Diagnosis present

## 2024-01-05 DIAGNOSIS — Z7982 Long term (current) use of aspirin: Secondary | ICD-10-CM

## 2024-01-05 LAB — CBC WITH DIFF
BASOPHIL #: 0 10*3/uL (ref 0.00–0.20)
BASOPHIL %: 1 % (ref 0–2)
EOSINOPHIL #: 0.1 10*3/uL (ref 0.00–0.60)
EOSINOPHIL %: 2 % (ref 0–5)
HCT: 40.8 % (ref 36.0–46.0)
HGB: 13.5 g/dL — ABNORMAL LOW (ref 13.9–16.3)
LYMPHOCYTE #: 1 10*3/uL — ABNORMAL LOW (ref 1.10–3.80)
LYMPHOCYTE %: 24 % (ref 19–46)
MCH: 31.4 pg (ref 25.4–34.0)
MCHC: 33 g/dL (ref 30.0–37.0)
MCV: 95 fL (ref 80.0–100.0)
MONOCYTE #: 0.4 10*3/uL (ref 0.10–0.80)
MONOCYTE %: 10 % (ref 4–12)
MPV: 9.2 fL (ref 7.5–11.5)
NEUTROPHIL #: 2.6 10*3/uL (ref 1.80–7.50)
NEUTROPHIL %: 64 % (ref 41–69)
PLATELETS: 68 10*3/uL — ABNORMAL LOW (ref 130–400)
RBC: 4.3 10*6/uL (ref 4.30–5.90)
RDW: 16.3 % — ABNORMAL HIGH (ref 11.5–14.0)
WBC: 4 10*3/uL — ABNORMAL LOW (ref 4.5–11.5)

## 2024-01-05 LAB — COMPREHENSIVE METABOLIC PANEL, NON-FASTING
ALBUMIN/GLOBULIN RATIO: 1.2 — ABNORMAL LOW (ref 1.5–2.5)
ALBUMIN: 3.6 g/dL (ref 3.5–5.0)
ALKALINE PHOSPHATASE: 104 U/L (ref 38–126)
ALT (SGPT): 32 U/L (ref <50)
ANION GAP: 8 mmol/L (ref 5–19)
AST (SGOT): 46 U/L (ref 17–59)
BILIRUBIN TOTAL: 1.1 mg/dL (ref 0.2–1.3)
BUN/CREA RATIO: 10 (ref 6–20)
BUN: 16 mg/dL (ref 9–20)
CALCIUM: 8.8 mg/dL (ref 8.4–10.2)
CHLORIDE: 107 mmol/L (ref 98–107)
CO2 TOTAL: 24 mmol/L (ref 22–30)
CREATININE: 1.55 mg/dL — ABNORMAL HIGH (ref 0.66–1.20)
ESTIMATED GFR: 50 mL/min/{1.73_m2} — ABNORMAL LOW (ref >60)
GLUCOSE: 148 mg/dL — ABNORMAL HIGH (ref 74–106)
POTASSIUM: 4.1 mmol/L (ref 3.5–5.1)
PROTEIN TOTAL: 6.6 g/dL (ref 6.3–8.2)
SODIUM: 139 mmol/L (ref 137–145)

## 2024-01-05 LAB — URINALYSIS, MACRO/MICRO
BILIRUBIN: NEGATIVE mg/dL
GLUCOSE: NEGATIVE mg/dL
KETONES: NEGATIVE mg/dL
LEUKOCYTES: NEGATIVE WBCs/uL
NITRITE: NEGATIVE
PH: 6 (ref 5.0–9.0)
PROTEIN: 100 mg/dL — AB
SPECIFIC GRAVITY: 1.018 (ref 1.003–1.035)
UROBILINOGEN: 3 mg/dL — AB (ref <2.0)

## 2024-01-05 LAB — SCAN DIFFERENTIAL
PLATELET ESTIMATE: DECREASED
RBC MORPHOLOGY COMMENT: NORMAL
WBC MORPHOLOGY COMMENT: NORMAL

## 2024-01-05 LAB — TYPE AND SCREEN
ABO/RH(D): A POS
ANTIBODY SCREEN: NEGATIVE

## 2024-01-05 LAB — ETHANOL, SERUM/PLASMA: ETHANOL: <10 mg/dL (ref <10)

## 2024-01-05 LAB — PT/INR
INR: 1.16 — ABNORMAL HIGH (ref 0.78–1.11)
PROTHROMBIN TIME: 13 s — ABNORMAL HIGH (ref 10.2–12.5)

## 2024-01-05 LAB — PTT (PARTIAL THROMBOPLASTIN TIME): APTT: 31.4 s (ref 22.0–32.0)

## 2024-01-05 LAB — LIPASE: LIPASE: 293 U/L (ref 23–300)

## 2024-01-05 LAB — CREATINE KINASE (CK), TOTAL, SERUM OR PLASMA: CREATINE KINASE: 45 U/L — ABNORMAL LOW (ref 55–170)

## 2024-01-05 LAB — LIGHT GREEN TOP TUBE

## 2024-01-05 LAB — URINE HOLD

## 2024-01-05 LAB — BLUE TOP TUBE

## 2024-01-05 LAB — MICRO HOLD

## 2024-01-05 MED ORDER — ARFORMOTEROL 15 MCG/2 ML SOLUTION FOR NEBULIZATION
15.0000 ug | INHALATION_SOLUTION | Freq: Two times a day (BID) | RESPIRATORY_TRACT | Status: DC
Start: 2024-01-05 — End: 2024-01-09
  Administered 2024-01-05 – 2024-01-09 (×8): 15 ug via RESPIRATORY_TRACT
  Filled 2024-01-05 (×8): qty 1

## 2024-01-05 MED ORDER — SODIUM CHLORIDE 0.9 % IV BOLUS
1000.0000 mL | INJECTION | Status: AC
Start: 2024-01-05 — End: 2024-01-05
  Administered 2024-01-05: 1000 mL via INTRAVENOUS
  Administered 2024-01-05: 0 mL via INTRAVENOUS

## 2024-01-05 MED ORDER — ASPIRIN 81 MG TABLET,DELAYED RELEASE
81.0000 mg | DELAYED_RELEASE_TABLET | Freq: Every day | ORAL | Status: DC
Start: 2024-01-06 — End: 2024-01-09

## 2024-01-05 MED ORDER — OLANZAPINE 10 MG TABLET
10.0000 mg | ORAL_TABLET | Freq: Every evening | ORAL | Status: DC
Start: 2024-01-05 — End: 2024-01-09
  Administered 2024-01-05 – 2024-01-08 (×4): 10 mg via ORAL
  Filled 2024-01-05 (×4): qty 1

## 2024-01-05 MED ORDER — PANTOPRAZOLE 40 MG TABLET,DELAYED RELEASE
40.0000 mg | DELAYED_RELEASE_TABLET | Freq: Two times a day (BID) | ORAL | Status: DC
Start: 2024-01-06 — End: 2024-01-09
  Administered 2024-01-06 – 2024-01-09 (×7): 40 mg via ORAL
  Filled 2024-01-05 (×7): qty 1

## 2024-01-05 MED ORDER — SODIUM CHLORIDE 0.9 % (FLUSH) INJECTION SYRINGE
10.0000 mL | INJECTION | Freq: Three times a day (TID) | INTRAMUSCULAR | Status: DC
Start: 2024-01-05 — End: 2024-01-09
  Administered 2024-01-05: 10 mL
  Administered 2024-01-06 – 2024-01-07 (×4): 0 mL
  Administered 2024-01-07: 10 mL
  Administered 2024-01-07 – 2024-01-09 (×6): 0 mL

## 2024-01-05 MED ORDER — IPRATROPIUM 0.5 MG-ALBUTEROL 3 MG (2.5 MG BASE)/3 ML NEBULIZATION SOLN
3.0000 mL | INHALATION_SOLUTION | RESPIRATORY_TRACT | Status: AC
Start: 2024-01-05 — End: 2024-01-05
  Administered 2024-01-05: 3 mL via RESPIRATORY_TRACT

## 2024-01-05 MED ORDER — ALBUTEROL SULFATE 2.5 MG/3 ML (0.083 %) SOLUTION FOR NEBULIZATION
2.5000 mg | INHALATION_SOLUTION | Freq: Four times a day (QID) | RESPIRATORY_TRACT | Status: DC | PRN
Start: 2024-01-05 — End: 2024-01-09

## 2024-01-05 MED ORDER — TRAZODONE 50 MG TABLET
50.0000 mg | ORAL_TABLET | Freq: Every evening | ORAL | Status: DC
Start: 2024-01-05 — End: 2024-01-09
  Administered 2024-01-05 – 2024-01-08 (×4): 50 mg via ORAL
  Filled 2024-01-05 (×4): qty 1

## 2024-01-05 MED ORDER — SERTRALINE 100 MG TABLET
200.0000 mg | ORAL_TABLET | Freq: Every day | ORAL | Status: DC
Start: 2024-01-06 — End: 2024-01-09
  Administered 2024-01-06 – 2024-01-09 (×4): 200 mg via ORAL
  Filled 2024-01-05 (×4): qty 2

## 2024-01-05 MED ORDER — SODIUM CHLORIDE 0.9 % (FLUSH) INJECTION SYRINGE
10.0000 mL | INJECTION | INTRAMUSCULAR | Status: DC | PRN
Start: 2024-01-05 — End: 2024-01-09
  Administered 2024-01-05: 10 mL

## 2024-01-05 MED ORDER — CLOPIDOGREL 75 MG TABLET
75.0000 mg | ORAL_TABLET | Freq: Every day | ORAL | Status: DC
Start: 2024-01-06 — End: 2024-01-09

## 2024-01-05 MED ORDER — NICOTINE 14 MG/24 HR DAILY TRANSDERMAL PATCH
14.0000 mg | MEDICATED_PATCH | Freq: Every day | TRANSDERMAL | Status: DC | PRN
Start: 2024-01-05 — End: 2024-01-09
  Administered 2024-01-06 (×2): 0 mg via TRANSDERMAL
  Filled 2024-01-05: qty 1

## 2024-01-05 MED ORDER — TAMSULOSIN 0.4 MG CAPSULE
0.4000 mg | ORAL_CAPSULE | Freq: Every evening | ORAL | Status: DC
Start: 2024-01-05 — End: 2024-01-09
  Administered 2024-01-05 – 2024-01-08 (×4): 0.4 mg via ORAL
  Filled 2024-01-05 (×4): qty 1

## 2024-01-05 MED ORDER — ROSUVASTATIN 10 MG TABLET
10.0000 mg | ORAL_TABLET | Freq: Every evening | ORAL | Status: DC
Start: 2024-01-05 — End: 2024-01-09
  Administered 2024-01-05 – 2024-01-08 (×4): 10 mg via ORAL
  Filled 2024-01-05 (×4): qty 1

## 2024-01-05 MED ORDER — SODIUM CHLORIDE 0.9 % IV BOLUS
1000.0000 mL | INJECTION | Status: DC
Start: 2024-01-06 — End: 2024-01-05

## 2024-01-05 MED ORDER — BUDESONIDE 0.5 MG/2 ML SUSPENSION FOR NEBULIZATION
0.5000 mg | INHALATION_SUSPENSION | Freq: Two times a day (BID) | RESPIRATORY_TRACT | Status: DC
Start: 2024-01-05 — End: 2024-01-09
  Administered 2024-01-05 – 2024-01-09 (×8): 0.5 mg via RESPIRATORY_TRACT
  Filled 2024-01-05 (×8): qty 2

## 2024-01-05 MED ORDER — METHYLPREDNISOLONE SOD SUCC 125 MG SOLUTION FOR INJECTION WRAPPER
125.0000 mg | INTRAVENOUS | Status: AC
Start: 2024-01-05 — End: 2024-01-05
  Administered 2024-01-05: 125 mg via INTRAVENOUS
  Filled 2024-01-05: qty 2

## 2024-01-05 MED ORDER — SODIUM CHLORIDE 0.9 % INTRAVENOUS PIGGYBACK
1.0000 g | INTRAVENOUS | Status: DC
Start: 2024-01-05 — End: 2024-01-05
  Administered 2024-01-05: 1 g via INTRAVENOUS
  Administered 2024-01-05: 0 g via INTRAVENOUS
  Filled 2024-01-05: qty 10

## 2024-01-05 MED ORDER — ALBUMIN, HUMAN 5 % INTRAVENOUS SOLUTION
25.0000 g | INTRAVENOUS | Status: DC
Start: 2024-01-06 — End: 2024-01-05

## 2024-01-05 MED ORDER — IPRATROPIUM 0.5 MG-ALBUTEROL 3 MG (2.5 MG BASE)/3 ML NEBULIZATION SOLN
3.0000 mL | INHALATION_SOLUTION | Freq: Three times a day (TID) | RESPIRATORY_TRACT | Status: DC | PRN
Start: 2024-01-05 — End: 2024-01-09

## 2024-01-05 NOTE — ED Triage Notes (Addendum)
Patient is 63 yr old male, presents with c/o hematuria. Patient states that he's had blood in urine for about 1 month, patient reports darker urine, with clots, pain with urination. Patient hx of cirrosis, hx of COPD, states he should be wearing oxygen 5 liters , but "didn't feel like bringing it"

## 2024-01-05 NOTE — ED Provider Notes (Signed)
[facility] - Emergency Department  ED Primary Provider Note  History of Present Illness   Chief Complaint   Patient presents with    Abdominal Pain    Hematuria       Hector [name] is a 63 y.o. male presenting with abdominal pain.     Patient arrives to the ED by private vehicle accompanied by his wife. Patinet reports that for the last 2 months he has been having hematuria as well as right flank tenderness that radiates to his groin. He noted that is only painful at the end of his urination otherwise no other concerns. He has hx of COPD where he chronically wears 5 liters of O2, however, still chronically smokes. Pt states that he believes he is on plavix, but not sure. PMHx of liver cirrhosis. Pt denies any CP, SOB, fever, chills, nausea, vomiting, diarrhea, or any other worrisome symptoms.     ROS  All ROS reviewed, all negative other than stated in the HPI.      Historical Data   Below pertinent information reviewed with patient and/or EMR:  Past Medical History:   Diagnosis Date    Alcohol abuse     Carotid stenosis     Chronic obstructive airway disease (CMS HCC)     Cirrhosis (CMS HCC)     Closed fracture of transverse process of thoracic vertebra (CMS HCC) 05/01/2021    Coma (CMS HCC)     after fall due to alcholism May 2022    Delirium, withdrawal, alcoholic (CMS HCC)     Depression     Encephalopathy 05/15/2021    Esophageal reflux     HTN (hypertension)     Hyperlipidemia     Ileus (CMS HCC) 05/18/2021    Left pulmonary contusion 05/01/2021    Leukopenia 05/18/2021    Multiple rib fractures 05/01/2021    Oxygen dependent     Pleural effusion 05/11/2021    Serum ammonia increased (CMS HCC) 05/15/2021    Shortness of breath     Spleen injury 05/01/2021    Status post thoracentesis 05/19/2021    Thrombocytopenia (CMS HCC) 05/01/2021    Unknown cause of injury     fx nose    Wears glasses      Previous Medications    ALBUTEROL SULFATE (VENTOLIN HFA) 90 MCG/ACTUATION INHALATION ORAL INHALER    Take  2 Puffs by inhalation Every 6 hours as needed for Other (SHORTNESS OF BREATH,COUGH OR WHEEZING)    ASPIRIN (ECOTRIN) 81 MG ORAL TABLET, DELAYED RELEASE (E.C.)    Take 1 Tablet (81 mg total) by mouth Once a day    CLOPIDOGREL (PLAVIX) 75 MG ORAL TABLET    Take 1 Tablet (75 mg total) by mouth Once a day    FLUTICASONE PROPION-SALMETEROL (ADVAIR HFA) 230-21 MCG/ACTUATION INHALATION ORAL INHALER    Take 2 Puffs by inhalation Twice daily RINSE MOUTH WITH WATER AFTER INHALER USE    IPRATROPIUM-ALBUTEROL 0.5 MG-3 MG(2.5 MG BASE)/3 ML SOLUTION FOR NEBULIZATION    TAKE 3 ML BY NEBULIZATION THREE TIMES A DAY AS NEEDED FOR WHEEZING    OLANZAPINE (ZYPREXA) 10 MG ORAL TABLET    Take 1 Tablet (10 mg total) by mouth Every night    OXYGEN (O2) INHALATION GAS    Administer 4 L/min into affected nostril(s) continuous    PANTOPRAZOLE (PROTONIX) 40 MG ORAL TABLET, DELAYED RELEASE (E.C.)    TAKE 1 TABLET (40 MG TOTAL) BY MOUTH TWICE A DAY 30 MINTUTES BEFORE  MEALS FOR 90 DAYS    ROSUVASTATIN (CRESTOR) 10 MG ORAL TABLET    Take 1 Tablet (10 mg total) by mouth Every evening    SERTRALINE (ZOLOFT) 100 MG ORAL TABLET    Take 2 Tablets (200 mg total) by mouth Once a day for 30 days    TAMSULOSIN (FLOMAX) 0.4 MG ORAL CAPSULE    Take 1 Capsule (0.4 mg total) by mouth Every evening after dinner    TRAZODONE (DESYREL) 50 MG ORAL TABLET    Take 1 Tablet (50 mg total) by mouth Every night Unsure dosage     Allergies   Allergen Reactions    Bee Venom Protein (Honey Bee)      Bee stings     Past Surgical History:   Procedure Laterality Date    CAROTID STENT      COLONOSCOPY  08/2021    ESOPHAGOGASTRODUODENOSCOPY  08/2021    HX OTHER Right 06/29/2023    TCAR    HX TONSILLECTOMY      OTHER SURGICAL HISTORY      HX of plastic surgery to right head     Family Medical History:       Problem Relation (Age of Onset)    Asthma Father    Cerebral Aneurysm Sister    Dementia Mother    Heart Attack Paternal Grandmother (67)          Social History     Tobacco  Use    Smoking status: Every Day     Current packs/day: 1.00     Average packs/day: 1 pack/day for 51.1 years (51.1 ttl pk-yrs)     Types: Cigarettes     Start date: 70    Smokeless tobacco: Never    Tobacco comments:     THE PATIENT STARTED    Vaping Use    Vaping status: Never Used   Substance Use Topics    Alcohol use: Not Currently     Alcohol/week: 15.0 standard drinks of alcohol     Types: 15 Cans of beer per week     Comment:  may 17th, 2022     Drug use: Yes     Types: Marijuana     Comment:  a joint or 2 a day        Physical Exam   ED Triage Vitals   BP (Non-Invasive) 01/05/24 1320 (!) 124/93   Heart Rate 01/05/24 1313 63   Respiratory Rate 01/05/24 1313 (!) 24   Temperature 01/05/24 1313 36.5 C (97.7 F)   SpO2 01/05/24 1313 94 %   Weight 01/05/24 1313 113 kg (250 lb)   Height 01/05/24 1313 1.753 m (5\' 9" )     Nursing notes and vital signs reviewed.    Constitutional:  No acute distress.  Alert and oriented to person, place, and time.   HENT:   Head: Normocephalic and atraumatic.   Mouth/Throat: Oropharynx is clear and moist.   Eyes: EOMI, PERRL   Neck: Trachea midline. Neck supple.  Cardiovascular: RRR, No murmurs, rubs or gallops. Intact distal pulses.  Pulmonary/Chest: No respiratory distress. No rales or chest tenderness. Wheezing in all lung field.   Abdominal: BS +. Abdomen soft, no tenderness, rebound or guarding.  Back: No midline spinal tenderness, no paraspinal tenderness, no CVA tenderness.           Musculoskeletal: No edema, tenderness or deformity.  Skin: warm and dry. No rash, erythema, pallor or cyanosis  Psychiatric: normal mood and affect. Behavior  is normal.   Neurological: Patient keenly alert and responsive, CN II-XII grossly intact, moving all extremities equally and fully, normal gait    Patient Data   Labs:   Labs Ordered/Reviewed   COMPREHENSIVE METABOLIC PANEL, NON-FASTING - Abnormal; Notable for the following components:       Result Value    CREATININE 1.55 (*)     ESTIMATED  GFR 50 (*)     GLUCOSE 148 (*)     ALBUMIN/GLOBULIN RATIO 1.2 (*)     All other components within normal limits    Narrative:     Estimated Glomerular Filtration Rate (eGFR) is calculated using the CKD-EPI (2021) equation, intended for patients 31 years of age and older. If gender is not documented or "unknown", there will be no eGFR calculation.   CBC WITH DIFF - Abnormal; Notable for the following components:    WBC 4.0 (*)     HGB 13.5 (*)     RDW 16.3 (*)     PLATELETS 68 (*)     LYMPHOCYTE # 1.00 (*)     All other components within normal limits   URINALYSIS, MACRO/MICRO - Abnormal; Notable for the following components:    APPEARANCE Turbid (*)     PROTEIN 100 (*)     UROBILINOGEN 3.0 (*)     BLOOD Large (*)     RBCS TNTC (*)     WBCS 21-50 (*)     BACTERIA Mod (*)     All other components within normal limits   PT/INR - Abnormal; Notable for the following components:    PROTHROMBIN TIME 13.0 (*)     INR 1.16 (*)     All other components within normal limits    Narrative:     Coumadin therapy INR range for Conventional Anticoagulation is 2.0 to 3.0 and for Intensive Anticoagulation 2.5 to 3.5.   CREATINE KINASE (CK), TOTAL, SERUM OR PLASMA - Abnormal; Notable for the following components:    CREATINE KINASE 45 (*)     All other components within normal limits   LIPASE - Normal   PTT (PARTIAL THROMBOPLASTIN TIME) - Normal   URINE CULTURE,ROUTINE   CBC/DIFF    Narrative:     The following orders were created for panel order CBC/DIFF.  Procedure                               Abnormality         Status                     ---------                               -----------         ------                     CBC WITH WEXH[[id number]]                Abnormal            Final result                 Please view results for these tests on the individual orders.   URINALYSIS WITH REFLEX MICROSCOPIC AND CULTURE IF POSITIVE    Narrative:     The following orders were created for panel order  URINALYSIS WITH REFLEX  MICROSCOPIC AND CULTURE IF POSITIVE.  Procedure                               Abnormality         Status                     ---------                               -----------         ------                     URINALYSIS, MACRO/MICRO[[id number]]      Abnormal            Edited Result - FINAL        Please view results for these tests on the individual orders.   EXTRA TUBES    Narrative:     The following orders were created for panel order EXTRA TUBES.  Procedure                               Abnormality         Status                     ---------                               -----------         ------                     BLUE TOP YQIH[[id number]]                                    Final result               LIGHT GREEN TOP OVFI[[id number]]                             Final result                 Please view results for these tests on the individual orders.   BLUE TOP TUBE   LIGHT GREEN TOP TUBE   ADDITIONAL TESTING  REQUEST   SCAN DIFFERENTIAL   MICRO HOLD   URINE HOLD   ETHANOL, SERUM/PLASMA   TYPE AND SCREEN   CYTOPATHOLOGY, URINARY     Imaging:  CT CHEST ABDOMEN PELVIS WO IV CONTRAST   Final Result by Edi, [employer] In (01/21 1403)   MILD FULLNESS OF THE RIGHT URETER AND MILD RIGHT HYDRONEPHROSIS. THERE IS SOFT TISSUE DENSITY NEAR THE RIGHT URETEROVESICULAR JUNCTION WHICH COULD BE SOFT TISSUE MASS VERSUS BLOOD CLOT. UROLOGY FOLLOW-UP IS RECOMMENDED.      MODERATE EMPHYSEMA. NO ACUTE FINDINGS IN THE CHEST.      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 NO ASCITES.          One or more dose reduction techniques were used (e.g., Automated exposure control, adjustment of the mA and/or kV according to patient size, use of iterative reconstruction technique).  Radiologist location ID: [id number]           EKG: Sinus 60, first degree block, otherwise normal intervals, Right bundle Branch Block, no acute ischemic changes.     Medical Decision Making   MDM/Course:  Patient presents with  hematuria and right flank pain.  CT chest abdomen pelvis shows fullness of the right ureter and right hydronephrosis with a soft tissue density near the right UVJ.  Patient evaluated in the emergency department by Urology who will service consult with the case.  Patient is wheezing with poor air movement initially requiring more oxygen in his usual 5 L nasal cannula, appears to be having a chronic obstructive pulmonary disease exacerbation.  Creatinine is elevated from previous.  Urine shows blood and evidence of urinary tract infection.  Patient given breathing treatments, IV Solu-Medrol, IV fluids, IV antibiotics.  Patient will be admitted to family medicine to a telemetry bed.    Medications given:  Medications Administered in the ED   NS bolus infusion 1,000 mL (0 mL Intravenous Stopped 01/05/24 1502)   ipratropium-albuterol 0.5 mg-3 mg(2.5 mg base)/3 mL Solution for Nebulization (3 mL Nebulization Given 01/05/24 1539)   methylPREDNISolone sod succ (SOLU-medrol) 125 mg/2 mL injection (125 mg Intravenous Given 01/05/24 1607)   NS bolus infusion 1,000 mL (1,000 mL Intravenous New Bag/New Syringe 01/05/24 1605)       Clinical Impression   COPD exacerbation (CMS HCC) (Primary)   AKI (acute kidney injury) (CMS HCC)   UTI (urinary tract infection)   Hematuria, unspecified type       Disposition:  Admit to family medicine to a telemetry bed with consult to Urology     Critical Care Attestation:  As the ED physician, I have provided critical care for this patient.  This patient has high probability of imminent life or limb threatening deterioration due to  chronic obstructive pulmonary disease exacerbation, hematuria with finding of mass on bladder, urinary tract infection and AKI. .  I provided direct patient care, documentation of findings, review of medical records, discussion with patient/family, crystalloid volume resuscitation, interpretation of EKG, interpretation of chest x-ray, consultation with Urology treatment  of chronic obstructive pulmonary disease exacerbation, and consultation with Urology  and frequent reassessment.  Time spent providing critical care (exclusive of any billed procedures and/or teaching): 35 minutes.      Parts of this patients chart were completed in a retrospective fashion due to simultaneous direct patient care activities in the Emergency Department.   This note was partially generated using MModal Fluency Direct system, and there may be some incorrect words, spellings, and punctuation that were not noted in checking the note before saving.      I am scribing for, and in the presence of, Dr. Alena Bills for services provided on 01/05/2024.  Chasetta Cleon Gustin     Verda Cumins SCRIBE  01/05/2024, 13:40    I agree with the scribes documentation.  I was present for all parts of the patient's ED course.  Rohit Cathey Endow, DO  01/05/2024, 21:38      I am scribing for, and in the presence of, Dr. Alena Bills for services provided on 01/05/2024.  Layne Benton, SCRIBE  [place], [place] 01/05/2024,15:12    I agree with the scribes documentation.  I was present for all parts of the patient's ED course.  Rohit Cathey Endow, DO  01/05/2024, 21:38

## 2024-01-05 NOTE — Consults (Signed)
[facility]          UROLOGY CONSULT NOTE    Patient: Hector, [name], 63 y.o. male  Date of Admission:  01/05/2024  Date of Birth:  29-May-1961  Date of Service:  01/05/2024    HPI: Hector [name] is a 63 y.o. male who presented to the ED with hematuria and painful urination. Patient has a PMH significant for ETOH abuse, carotid stenosis, chronic obstructive pulmonary disease, cirrhosis, alcoholic delirium, depression, encephalopathy, gastroesophageal reflux disease, hypertension, hyperlipidemia, ileus, 5 L oxygen dependent, pleural effusion, and thrombocytopenia.    Patient presented to the ER today due to hematuria x1 month with clots and painful urination. Patient states that the pain occurs at the end of his stream.  He states that he does not feel as if he ever completely empties his bladder.  He complains of a weak stream.  Patient states that he has to "squeeze to stop" his urinary stream. Currently he is experiencing right CVA tenderness and does not recall how long that has been occurring. He admits to a history of urinary retention, but does not recall whether or not he has required a catheter. He does experience incontinence on occasion. Patient denies any previous occurrences of gross hematuria, kidney stones, or previous urologic concern.     Patient states that he has smoked cigarettes since he was 63 years old. He denies any previous prostate evaluations. He denies any family history of prostate, bladder or kidney cancer. He denies any family history of kidney stones.     CT A/P on admission revealed mild fullness of the right ureter and mild right hydronephrosis with a soft tissue density at the right ureterovesicular junction.    According to patient's medication list, it appears he is taking ASA, Plavix and Flomax.     WBC: 4.0  Hgb: 12.5   Creatinine: 1.55  UA: TNTC RBC, 21-50 WBC    Vital Signs:  Temperature: 36.5 C (97.7 F) (01/05/24 1313)  BP  (Non-Invasive): (!) 124/93 (01/05/24 1320)  Heart Rate: 59 (01/05/24 1530)  Respiratory Rate: 17 (01/05/24 1530)  SpO2: 96 % (01/05/24 1530)    ROS: As per HPI    Past Medical History:   Diagnosis Date    Alcohol abuse     Carotid stenosis     Chronic obstructive airway disease (CMS HCC)     Cirrhosis (CMS HCC)     Closed fracture of transverse process of thoracic vertebra (CMS HCC) 05/01/2021    Coma (CMS HCC)     after fall due to alcholism May 2022    Delirium, withdrawal, alcoholic (CMS HCC)     Depression     Encephalopathy 05/15/2021    Esophageal reflux     HTN (hypertension)     Hyperlipidemia     Ileus (CMS HCC) 05/18/2021    Left pulmonary contusion 05/01/2021    Leukopenia 05/18/2021    Multiple rib fractures 05/01/2021    Oxygen dependent     Pleural effusion 05/11/2021    Serum ammonia increased (CMS HCC) 05/15/2021    Shortness of breath     Spleen injury 05/01/2021    Status post thoracentesis 05/19/2021    Thrombocytopenia (CMS HCC) 05/01/2021    Unknown cause of injury     fx nose    Wears glasses      Past Surgical History:   Procedure Laterality Date    CAROTID STENT  COLONOSCOPY  08/2021    ESOPHAGOGASTRODUODENOSCOPY  08/2021    HX OTHER Right 06/29/2023    TCAR    HX TONSILLECTOMY      OTHER SURGICAL HISTORY      HX of plastic surgery to right head     Family Medical History:       Problem Relation (Age of Onset)    Asthma Father    Cerebral Aneurysm Sister    Dementia Mother    Heart Attack Paternal Grandmother (29)          Social History     Tobacco Use    Smoking status: Every Day     Current packs/day: 1.00     Average packs/day: 1 pack/day for 51.1 years (51.1 ttl pk-yrs)     Types: Cigarettes     Start date: 1974    Smokeless tobacco: Never    Tobacco comments:     THE PATIENT STARTED    Substance Use Topics    Alcohol use: Not Currently     Alcohol/week: 15.0 standard drinks of alcohol     Types: 15 Cans of beer per week     Comment:  may 17th, 2022      Medications: cefTRIAXone  (ROCEPHIN) 1 g in NS 100 mL IVPB - DOCKED, 1 g, Intravenous, Q24H  tamsulosin (FLOMAX) capsule, 0.4 mg, Oral, Daily after Dinner      Allergies   Allergen Reactions    Bee Venom Protein (Honey Bee)      Bee stings     Physical exam:  General Appearance:  Well developed, well nourished. No acute distress.    Skin: Warm and dry without rashes.  HEENT:  Normocephalic, atraumatic.  Sclerae nonicteric, conjunctiva pink,  Extraocular movements intact. Mucous membranes moist.  Neck: Trachea midline.  No thyromegaly visible  Respiratory:  Respirations nonlabored, chest wall expansion symmetrical. 5L NC.  Cardiovascular:  Extremities warm, well perfused, no edema.  Back: Right CVA tenderness (+)  Abdomen:  Soft, nontender, nondistended.    Psychiatric: Normal mood and affect.  Neurologic: Awake, alert and oriented x3  Genitourinary:  Deferred    Recent Results last 24 hours     01/05/24  1317   WBC 4.0*   HGB 13.5*   HCT 40.8   MCV 95.0   PLTCNT 68*     Recent Results last 24 hours     01/05/24  1317   SODIUM 139   POTASSIUM 4.1   CHLORIDE 107   CO2 24   BUN 16   CREATININE 1.55*   CALCIUM 8.8   GLUCOSENF 148*     Urine Dip  LEUKOCYTES   Date Value Ref Range Status   01/05/2024 Negative Negative WBCs/uL Final     NITRITE   Date Value Ref Range Status   01/05/2024 Negative Negative Final     UROBILINOGEN   Date Value Ref Range Status   01/05/2024 3.0 (A) < 2.0, 1.0, 0.2  mg/dL Final     PROTEIN   Date Value Ref Range Status   01/05/2024 100 (A) Negative, 10 , 20  mg/dL Final     PH (ARTERIAL)   Date Value Ref Range Status   05/19/2021 7.43 7.35 - 7.45 Final     BILIRUBIN   Date Value Ref Range Status   01/05/2024 Negative Negative mg/dL Final     GLUCOSE   Date Value Ref Range Status   01/05/2024 Negative 30 , Negative mg/dL Final  CT A/P 01/05/24:    Narrative & Impression   Hector [name]     RADIOLOGIST: Truman Hayward, MD     CT CHEST ABDOMEN PELVIS WO IV CONTRAST performed on 01/05/2024 1:44 PM     CLINICAL  HISTORY: sob, abd pain, hematuria.       blood in urine for months, reports dark clots and pain     TECHNIQUE:  Chest, abdomen and pelvis CT without intravenous contrast.      CT ABDOMEN/PELVIS:  Kidneys:   There is mild fullness of the right ureter and mild right hydronephrosis. There appears to be soft tissue density towards the right ureterovesicular junction seen on axial image 230 which is nonspecific. A soft tissue mass is not excluded. Urology follow-up recommended. The left ureter and kidney are unremarkable.  Bladder:  Unremarkable.  Prostate:  Unremarkable.  IMPRESSION:  MILD FULLNESS OF THE RIGHT URETER AND MILD RIGHT HYDRONEPHROSIS. THERE IS SOFT TISSUE DENSITY NEAR THE RIGHT URETEROVESICULAR JUNCTION WHICH COULD BE SOFT TISSUE MASS VERSUS BLOOD CLOT. UROLOGY FOLLOW-UP IS RECOMMENDED.     MODERATE EMPHYSEMA. NO ACUTE FINDINGS IN THE CHEST.     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 NO ASCITES.         Assessment/Plan:   Right hydronephrosis  Gross hematuria with presence of clots  Tobacco dependency   Dysuria  Right CVA tenderness    -Discussed patient with Dr. Idamae Lusher. In view of hematuria and possible bladder tumor, patient needs diagnostic cystoscopy with right ureteroscopy. However patient is currently on ASA and Plavix. Unless the patient needs clot evacuation, this can be performed in the outpatient setting.   -Would recommend holding anticoagulants  -Urine cytology ordered.  -Continue tamsulosin 0.4 mg daily as tolerated.  -Obtain PVR q void to ensure bladder is emptying.     I independently of the faculty provider spent a total of 35 minutes in direct care of this patient including initial evaluation, review of laboratory, Radiology, diagnostic studies, review of medical record, order entry and coordination of care.    Orvil Feil, APRN-BC  Advanced Practice Provider  [facility]  Department of Urology    Portions of this note may be dictated using  voice recognition software or a dictation device.  Variances in spelling and vocabulary are possible and unintentional.  Not all errors are caught and corrected.  Please notify the Thereasa Parkin if any discrepancies are noted or if the meaning of any statement is not clear.

## 2024-01-05 NOTE — H&P (Signed)
NAME: Hector [name]  AGE: 63 y.o.  SEX: male  MRN: [id number]    DATE OF ADMISSION: 01/05/2024  PCP: Robin Searing, MD  ATTENDING: Buren Kos, MD     DEPARTMENT OF FAMILY MEDICINE  INPATIENT HISTORY AND PHYSICAL EXAM        SUBJECTIVE     Chief Complaint  Bloody urine, abdominal pain    History of Present Illness  Hector [name] is a 62 y.o. male with a past medical history of hyperlipidemia, hypertension, depression, alcohol use disorder now sober for three years, COPD with 5L O2 at baseline, and cirrhosis of the liver. Patient has a noted history of urinary frequency as well which was managed with outpatient tamsulosin.    The patient has been assessed and examined today for concerns of hematuria. Reportedly, patient has experience two months of persistently bloody urine as well as periodic waxing/waning abdominal pain which is not present at the time of evaluation. Patient cited that he often experiences incomplete voiding and has a sensation of hesitation that accompanies urination, which seems to happen very suddenly nowadays. Patient had been taking tamsulosin at home as documented above in addition to other medications; however, patient noted that it was time to be seen as his condition over the last two months has now happened long enough. In addition to these complaints, patient is on 5L oxygen by nasal cannula at baseline while at home due to COPD secondary to long-term tobacco use, and as part of this patient noted that he experiences occasional shortness of breath as well as headache; however, patient is not experiencing shortness of breath at this time. Patient could not identify a chronicity or a pattern of headaches or shortness of breath. Furthermore, patient noted that he would often remove oxygen and be active in his home without it, likely lending to his shortness of breath.    Patient denied experiencing constitutional complaints of fever, chills, nausea, disorientation, weakness, fatigue,  constipation, or diarrhea.    Review of Systems   Review of Systems   Constitutional:  Negative for activity change, appetite change, chills, diaphoresis, fatigue and fever.   Respiratory:  Positive for shortness of breath. Negative for cough and chest tightness.    Cardiovascular:  Negative for chest pain.   Gastrointestinal:  Positive for abdominal pain. Negative for abdominal distention, constipation, diarrhea, nausea and vomiting.   Genitourinary:  Positive for decreased urine volume, difficulty urinating, dysuria, flank pain, hematuria and urgency. Negative for genital sores, penile pain, penile swelling and testicular pain.   Neurological:  Positive for headaches. Negative for dizziness, weakness, light-headedness and numbness.   Psychiatric/Behavioral:  Negative for confusion.           MEDICAL HISTORY     Medications  Current Outpatient Medications   Medication Instructions    albuterol sulfate (VENTOLIN HFA) 90 mcg/actuation Inhalation oral inhaler 2 Puffs, Inhalation, EVERY 6 HOURS PRN    aspirin (ECOTRIN) 81 mg, Oral, DAILY    clopidogreL (PLAVIX) 75 mg, Oral, DAILY    fluticasone propion-salmeteroL (ADVAIR HFA) 230-21 mcg/actuation Inhalation oral inhaler 2 Puffs, Inhalation, 2 TIMES DAILY, RINSE MOUTH WITH WATER AFTER INHALER USE    ipratropium-albuterol 0.5 mg-3 mg(2.5 mg base)/3 mL Solution for Nebulization 3 mL, Nebulization, 3 TIMES DAILY PRN    OLANZapine (ZYPREXA) 10 mg, Oral, NIGHTLY    oxygen (O2) 4 L/min, Nasal, CONTINUOUS    pantoprazole (PROTONIX) 40 mg Oral Tablet, Delayed Release (E.C.) TAKE 1 TABLET (40 MG TOTAL) BY MOUTH  TWICE A DAY 30 MINTUTES BEFORE MEALS FOR 90 DAYS    rosuvastatin (CRESTOR) 10 mg, Oral, EVERY EVENING    sertraline (ZOLOFT) 200 mg, Oral, DAILY    tamsulosin (FLOMAX) 0.4 mg, Oral, EVERY EVENING AFTER DINNER    traZODone (DESYREL) 50 mg, Oral, NIGHTLY, Unsure dosage        Past Medical History  Past Medical History:   Diagnosis Date    Alcohol abuse     Carotid stenosis      Chronic obstructive airway disease (CMS HCC)     Cirrhosis (CMS HCC)     Closed fracture of transverse process of thoracic vertebra (CMS HCC) 05/01/2021    Coma (CMS HCC)     after fall due to alcholism May 2022    Delirium, withdrawal, alcoholic (CMS HCC)     Depression     Encephalopathy 05/15/2021    Esophageal reflux     HTN (hypertension)     Hyperlipidemia     Ileus (CMS HCC) 05/18/2021    Left pulmonary contusion 05/01/2021    Leukopenia 05/18/2021    Multiple rib fractures 05/01/2021    Oxygen dependent     Pleural effusion 05/11/2021    Serum ammonia increased (CMS HCC) 05/15/2021    Shortness of breath     Spleen injury 05/01/2021    Status post thoracentesis 05/19/2021    Thrombocytopenia (CMS HCC) 05/01/2021    Unknown cause of injury     fx nose    Wears glasses            Past Surgical History  Past Surgical History:   Procedure Laterality Date    CAROTID STENT      COLONOSCOPY  08/2021    ESOPHAGOGASTRODUODENOSCOPY  08/2021    HX OTHER Right 06/29/2023    TCAR    HX TONSILLECTOMY      OTHER SURGICAL HISTORY      HX of plastic surgery to right head           Family History  Family Medical History:       Problem Relation (Age of Onset)    Asthma Father    Cerebral Aneurysm Sister    Dementia Mother    Heart Attack Paternal Grandmother (77)              Social History  Social History     Tobacco Use   Smoking Status Every Day    Current packs/day: 1.00    Average packs/day: 1 pack/day for 51.1 years (51.1 ttl pk-yrs)    Types: Cigarettes    Start date: 1974   Smokeless Tobacco Never   Tobacco Comments    THE PATIENT STARTED      Social History     Substance and Sexual Activity   Alcohol Use Not Currently    Alcohol/week: 15.0 standard drinks of alcohol    Types: 15 Cans of beer per week    Comment:  may 17th, 2022      Social History     Substance and Sexual Activity   Drug Use Yes    Types: Marijuana    Comment:  a joint or 2 a day        Allergies  Allergies   Allergen Reactions    Bee Venom Protein  (Honey Bee)      Bee stings         OBJECTIVE     Vital Signs BP 130/78   Pulse 66  Temp 36.6 C (97.8 F)   Resp 16   Ht 1.753 m (5\' 9" )   Wt 113 kg (250 lb)   SpO2 97%   BMI 36.92 kg/m      Physical Exam Physical Exam  Vitals and nursing note reviewed.   Constitutional:       General: He is not in acute distress.     Appearance: He is well-developed and overweight. He is not ill-appearing or toxic-appearing.   HENT:      Head: Normocephalic and atraumatic.      Right Ear: External ear normal.      Left Ear: External ear normal.      Mouth/Throat:      Pharynx: Oropharynx is clear.   Eyes:      General: No scleral icterus.     Conjunctiva/sclera: Conjunctivae normal.   Cardiovascular:      Rate and Rhythm: Normal rate and regular rhythm.      Pulses: Normal pulses.      Heart sounds: Normal heart sounds. No murmur heard.     No friction rub. No gallop.   Pulmonary:      Effort: Pulmonary effort is normal. No respiratory distress.      Breath sounds: Normal breath sounds. No stridor. No wheezing, rhonchi or rales.   Abdominal:      General: Abdomen is flat. Bowel sounds are normal. There is no distension.      Palpations: Abdomen is soft. There is no mass.      Tenderness: There is no abdominal tenderness. There is no guarding.      Hernia: No hernia is present.   Musculoskeletal:         General: No swelling.      Cervical back: Neck supple.   Skin:     General: Skin is warm and dry.      Capillary Refill: Capillary refill takes less than 2 seconds.      Coloration: Skin is not pale.   Neurological:      Mental Status: He is alert.   Psychiatric:         Mood and Affect: Mood normal.         Behavior: Behavior is cooperative.            Diagnostic Studies    CBC Recent Labs     01/05/24  1317   WBC 4.0*   HGB 13.5*   HCT 40.8     Differential Recent Labs     01/05/24  1317   PMNS 64   LYMPHOCYTES 24   MONOCYTES 10   EOSINOPHIL 2   BASOPHILS 1  0.00   PMNABS 2.60   LYMPHSABS 1.00*   MONOSABS 0.40   EOSABS  0.10             BMP Recent Labs     01/05/24  1317   SODIUM 139   POTASSIUM 4.1   CHLORIDE 107   CO2 24   BUN 16   CREATININE 1.55*   GFR 50*   ANIONGAP 8     Other Chemistry Recent Labs     01/05/24  1317   CALCIUM 8.8   ALBUMIN 3.6             Liver and Pancreatic Enzymes Recent Labs     01/05/24  1317   TOTALPROTEIN 6.6   ALBUMIN 3.6   AST 46   ALT 32   ALKPHOS 104   LIPASE 293  Blood Gas No results found for this encounter          Cardiac No results for input(s): "UHCEASTTROPI", "CKMB", "MBINDEX", "BNP" in the last 72 hours.  Coags Recent Labs     01/05/24  1329   INR 1.16*             Imaging Results for orders placed or performed during the hospital encounter of 01/05/24 (from the past 72 hour(s))   CT CHEST ABDOMEN PELVIS WO IV CONTRAST     Status: None    Narrative    Andon LEE Crumbley    RADIOLOGIST: Truman Hayward, MD    CT CHEST ABDOMEN PELVIS WO IV CONTRAST performed on 01/05/2024 1:44 PM    CLINICAL HISTORY: sob, abd pain, hematuria.      blood in urine for months, reports dark clots and pain    TECHNIQUE:  Chest, abdomen and pelvis CT without intravenous contrast.     COMPARISON:  11/22/23      FINDINGS:  CT CHEST:  Hardware:  None.    Lymph nodes:   No mediastinal, hilar, or axillary lymphadenopathy.    Heart and Vasculature:  Normal heart size.  No pericardial effusion. Thoracic aorta and pulmonary arteries have normal contours; noncontrast technique limits evaluation.      Coronary Artery Calcifications: Present    Lungs and Airways:  There is rounded atelectasis at the medial right lower lobe with adjacent pleural thickening. There is also linear atelectasis in the lingula.    Chronic emphysema is mild at the apex.    Pleura: No pleural effusion.  No pneumothorax.    Bones: Bone windows are unremarkable.      CT ABDOMEN/PELVIS:  Noncontrast technique limits evaluation of the abdominal and pelvic viscera.    Liver:   There is significant sclerotic morphology of the liver with multiple  surface nodules and prominent interlobar fissure.    Gallbladder:   Multiple small layering gallstones. No CT evidence of cholecystitis.    Spleen:   Mildly enlarged measuring 16 cm in length.    Pancreas:   Unremarkable.    Adrenals:   Unremarkable.    Kidneys:   There is mild fullness of the right ureter and mild right hydronephrosis. There appears to be soft tissue density towards the right ureterovesicular junction seen on axial image 230 which is nonspecific. A soft tissue mass is not excluded. Urology follow-up recommended. The left ureter and kidney are unremarkable.    Bladder:  Unremarkable.  Prostate:  Unremarkable.    Bowel:   Unremarkable.    Appendix:  Normal.    Lymph nodes:  No suspicious lymph node enlargement.    Vasculature:   Mild diffuse atherosclerotic calcifications are noted.     Peritoneum / Retroperitoneum: No ascites.  No free air.    Bones:   Unremarkable.          Impression    MILD FULLNESS OF THE RIGHT URETER AND MILD RIGHT HYDRONEPHROSIS. THERE IS SOFT TISSUE DENSITY NEAR THE RIGHT URETEROVESICULAR JUNCTION WHICH COULD BE SOFT TISSUE MASS VERSUS BLOOD CLOT. UROLOGY FOLLOW-UP IS RECOMMENDED.    MODERATE EMPHYSEMA. NO ACUTE FINDINGS IN THE CHEST.    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 NO ASCITES.       One or more dose reduction techniques were used (e.g., Automated exposure control, adjustment of the mA and/or kV according to patient size, use of iterative reconstruction technique).      Radiologist location  ID: [id number]       Initial EKG Most Recent EKG This Encounter   ECG 12 LEAD    Collection Time: 01/05/24  2:55 PM   Result Value    Ventricular rate 60    PR Interval 216    QRS Duration 108    QT Interval 440    QTC Calculation 441    Calculated P Axis 65    Calculated R Axis 55    Calculated T Axis 49    Narrative    Sinus rhythm  with occasional supraventricular premature complexes  First degree AV block  **  abnormal ECG  **             Micro No  results found for any visits on 01/05/24 (from the past 96 hour(s)).           ASSESSMENT     Hector [name] is a 63 y.o. male presenting with the following hospital problems:    Active Hospital Problems    Diagnosis    Primary Problem: Hematuria          CARE PLAN     TODAY: Saadiq has been admitted to the inpatient Family Medicine resident service for further monitoring. We will continue to observe the patient with regular diet until midnight as patient has a planned cystoscopy/uroscopy pending for tomorrow per urology. Antiplatelet/anticoagulant medications have been held ahead of planned procedure.    Hematuria and urinary incontinence/retention of indeterminate etiology, likely obstructive; r/o bladder malignancy/mass  Acute kidney injury, suspected due to obstructive uropathy  Initial diagnostics  WBC 4.0, Hgb 13.5  INR 1.16, PT 13.0  CMP notable for creatinine 1.55 up from baseline 0.7-0.8  Urinalysis notable for turbid frankly bloody specimen with elevated protein, urobilinogen; RBC TNTC, WBC elevated, moderate bacteria  Urine culture pending  Chest/abdomen/pelvis CT notable for mild fullness of right ureter and mild right hydronephrosis with soft tissue density near right ureterovesicular junction, possibly soft tissue mass vs clot; hepatic cirrhosis with varices  Inpatient consult placed to Urology / Dr Idamae Lusher  Uroscopy/cystoscopy recommended  Holding aspirin/clopidogrel per recommendations  Continue tamsulosin 0.4 mg qPM  Bladder scan post void residual  Ceftriaxone 1 g given in ED; daytime team may determine clinical need for continued antibiotics  AM CBC/BMP/Mg/Phos    Chronic obstructive pulmonary disease, not in acute exacerbation  Initial diagnostics  Chest/abdomen/pelvis CT unremarkable of acute chest pathology  EKG notable for sinus rhythm, occasional supraventricular premature complexes, first degree AV block  Home inhalers/nebulizers ordered and adapted to hospital formulary  Albuterol 2.5 mg  nebulization q6h PRN  Advair adapted to budesonide/arformoterol nebulization BID  Duo-Neb TID PRN    Chronic conditions - continue home medications unless specified otherwise  Alcohol use disorder  EtOH <10; no CIWA protocol necessary at this time  Tobacco use disorder  Begin nicotine transdermal patch q24h PRN  Anxiety/depression  Trazodone 50 mg nightly  Sertraline 200 mg daily  Olanzapine 10 mg nightly  Gastroesophageal reflux disease  Pantoprazole 40 mg BID      Diet: DIET NPO - SPECIFIC DATE & TIME  DIET REGULAR  DVT/PE Prophylaxis: SCDs/ Venodynes/Impulse boots  Code Status: FULL CODE: ATTEMPT RESUSCITATION/CPR    Disposition: Home when medically stable          __________________________  Estrella Myrtle, DO  PGY-1, Family Medicine  [place]  [facility]    Documented on 01/05/2024 at 19:04.  I saw and examined the patient.  I reviewed the  resident's note.  I agree with the findings and plan of care as documented in the resident's note.  Any exceptions/additions are edited/noted.  Urology eval.  IV fluids.  Monitor urine culture.  No signs of infection.  Hold aspirin Plavix.    Buren Kos, MD

## 2024-01-05 NOTE — ED Nurses Note (Signed)
Report called to [facility], RN

## 2024-01-06 ENCOUNTER — Inpatient Hospital Stay (HOSPITAL_COMMUNITY): Payer: MEDICAID

## 2024-01-06 DIAGNOSIS — D72819 Decreased white blood cell count, unspecified: Secondary | ICD-10-CM

## 2024-01-06 DIAGNOSIS — N179 Acute kidney failure, unspecified: Secondary | ICD-10-CM

## 2024-01-06 DIAGNOSIS — I440 Atrioventricular block, first degree: Secondary | ICD-10-CM

## 2024-01-06 DIAGNOSIS — E871 Hypo-osmolality and hyponatremia: Secondary | ICD-10-CM

## 2024-01-06 DIAGNOSIS — Z7902 Long term (current) use of antithrombotics/antiplatelets: Secondary | ICD-10-CM

## 2024-01-06 DIAGNOSIS — F419 Anxiety disorder, unspecified: Secondary | ICD-10-CM

## 2024-01-06 DIAGNOSIS — N1330 Unspecified hydronephrosis: Secondary | ICD-10-CM

## 2024-01-06 DIAGNOSIS — J449 Chronic obstructive pulmonary disease, unspecified: Secondary | ICD-10-CM

## 2024-01-06 DIAGNOSIS — D6959 Other secondary thrombocytopenia: Secondary | ICD-10-CM

## 2024-01-06 DIAGNOSIS — R9389 Abnormal findings on diagnostic imaging of other specified body structures: Secondary | ICD-10-CM

## 2024-01-06 DIAGNOSIS — R310 Gross hematuria: Secondary | ICD-10-CM

## 2024-01-06 DIAGNOSIS — E860 Dehydration: Secondary | ICD-10-CM

## 2024-01-06 LAB — CBC WITH DIFF
BASOPHIL #: 0 10*3/uL (ref 0.00–0.20)
BASOPHIL %: 0 % (ref 0–2)
EOSINOPHIL #: 0 10*3/uL (ref 0.00–0.60)
EOSINOPHIL %: 0 % (ref 0–5)
HCT: 36.4 % (ref 36.0–46.0)
HGB: 12.3 g/dL — ABNORMAL LOW (ref 13.9–16.3)
LYMPHOCYTE #: 0.5 10*3/uL — ABNORMAL LOW (ref 1.10–3.80)
LYMPHOCYTE %: 12 % — ABNORMAL LOW (ref 19–46)
MCH: 31.7 pg (ref 25.4–34.0)
MCHC: 33.7 g/dL (ref 30.0–37.0)
MCV: 94.2 fL (ref 80.0–100.0)
MONOCYTE #: 0.2 10*3/uL (ref 0.10–0.80)
MONOCYTE %: 5 % (ref 4–12)
MPV: 8.9 fL (ref 7.5–11.5)
NEUTROPHIL #: 3.2 10*3/uL (ref 1.80–7.50)
NEUTROPHIL %: 83 % — ABNORMAL HIGH (ref 41–69)
PLATELETS: 53 10*3/uL — ABNORMAL LOW (ref 130–400)
RBC: 3.87 10*6/uL — ABNORMAL LOW (ref 4.30–5.90)
RDW: 15.7 % — ABNORMAL HIGH (ref 11.5–14.0)
WBC: 3.8 10*3/uL — ABNORMAL LOW (ref 4.5–11.5)

## 2024-01-06 LAB — ECG 12 LEAD
Calculated P Axis: 65 degrees
Calculated R Axis: 55 degrees
Calculated T Axis: 49 degrees
PR Interval: 216 ms
QRS Duration: 108 ms
QT Interval: 440 ms
QTC Calculation: 441 ms
Ventricular rate: 60 {beats}/min

## 2024-01-06 LAB — BASIC METABOLIC PANEL
ANION GAP: -1 mmol/L — ABNORMAL LOW (ref 5–19)
BUN/CREA RATIO: 14 (ref 6–20)
BUN: 19 mg/dL (ref 9–20)
CALCIUM: 8.2 mg/dL — ABNORMAL LOW (ref 8.4–10.2)
CHLORIDE: 112 mmol/L — ABNORMAL HIGH (ref 98–107)
CO2 TOTAL: 23 mmol/L (ref 22–30)
CREATININE: 1.37 mg/dL — ABNORMAL HIGH (ref 0.66–1.20)
ESTIMATED GFR: 58 mL/min/{1.73_m2} — ABNORMAL LOW (ref >60)
GLUCOSE: 151 mg/dL — ABNORMAL HIGH (ref 74–106)
POTASSIUM: 4.6 mmol/L (ref 3.5–5.1)
SODIUM: 134 mmol/L — ABNORMAL LOW (ref 137–145)

## 2024-01-06 LAB — MAGNESIUM: MAGNESIUM: 2 mg/dL (ref 1.6–2.3)

## 2024-01-06 LAB — PHOSPHORUS: PHOSPHORUS: 2.6 mg/dL (ref 2.5–4.5)

## 2024-01-06 MED ORDER — IOPAMIDOL 300 MG IODINE/ML (61 %) INTRAVENOUS SOLUTION
75.0000 mL | INTRAVENOUS | Status: AC
Start: 2024-01-06 — End: 2024-01-06
  Administered 2024-01-06: 75 mL via INTRAVENOUS

## 2024-01-06 MED ORDER — NICOTINE 21 MG/24 HR DAILY TRANSDERMAL PATCH
21.0000 mg | MEDICATED_PATCH | Freq: Every day | TRANSDERMAL | Status: DC
Start: 2024-01-06 — End: 2024-01-09
  Administered 2024-01-06 – 2024-01-09 (×4): 21 mg via TRANSDERMAL
  Filled 2024-01-06 (×4): qty 1

## 2024-01-06 NOTE — Progress Notes (Unsigned)
[facility] [address] [place], [place] [address] [phone]  Date:  01/07/2024   Name:  Curtis Luna   DOB:  06-Jan-1961   MRN:  [id number]  PCP:  Pearline Cables, MD    Chief Complaint: No chief complaint on file.   History of Present Illness:  Curtis Luna is a 63 y.o. very pleasant male patient who presents with the following:  Patient seen today for follow-up of elevated lipase- He does have history of coronary artery disease, ocular migraine, GERD, hyperlipidemia  Most recent visit with myself was in October He recently had nerve conduction studies per neurology to assess bilateral arm numbness and tingling: Conclusion: Nerve conduction studies of the left ulnar motor nerve show a 35 m/s drop across the left elbow.  EMG needle study shows chronic neurogenic changes in ulnar muscles of the left upper extremity.  This is consistent with ulnar neuropathy across the left elbow.  No suggestion of polyneuropathy or cervical radiculopathy.  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   Seasonal affective disorder (HCC) 10/02/2014   Internal hemorrhoid 04/20/2014   Chronic low back pain 04/20/2014   Anxiety 04/20/2014   Esophageal reflux 04/04/2011   Diverticulosis of colon 04/04/2011    Past Medical History:  Diagnosis Date   Abdominal pain, right lower quadrant    Acute bronchospasm    Allergy    Anxiety    on and off   Backache, unspecified    Chicken pox    Diverticulosis of colon (without mention of hemorrhage)    Esophageal reflux    External  hemorrhoids without mention of complication    thrombosed   Hiatal hernia    Hyperlipidemia    no medicines    Inguinal hernia without mention of obstruction or gangrene, unilateral or unspecified, (not specified as recurrent)    Migraine    Neuropathy    Obstructive sleep apnea (adult) (pediatric)    uses oral appliance   Other symptoms involving digestive system(787.99)    Pain in joint, forearm    Pain in joint, shoulder region    Seasonal allergies    Sleep apnea    no cpap but uses dental device    Thoracic or lumbosacral neuritis or radiculitis, unspecified     Past Surgical History:  Procedure Laterality Date   COLON RESECTION  10/06   sigmoid colon   COLONOSCOPY     INGUINAL HERNIA REPAIR  12/2012   Left   UPPER GASTROINTESTINAL ENDOSCOPY     WISDOM TOOTH EXTRACTION      Social History   Tobacco Use   Smoking status: Former    Current packs/day: 0.00    Types: Cigarettes, Cigars    Quit date: 10/04/1987    Years since quitting: 36.2   Smokeless tobacco: Never  Vaping Use   Vaping status: Never Used  Substance Use Topics   Alcohol use: Yes    Alcohol/week: 14.0 standard drinks of alcohol    Types: 14 Standard drinks or equivalent per week    Comment: 2  daily    Drug use: No    Family History  Problem Relation Age of Onset   Lung cancer Mother 40       Deceased   Other Father        MVA   Aneurysm Sister        Brain   Healthy Brother        x2   Heart disease Paternal Grandmother    Heart attack Paternal Grandmother    Allergies Daughter        x2   Migraines Daughter        x1   Diabetes Maternal Aunt    Neuropathy Other    Colon cancer Neg Hx    Colon polyps Neg Hx    Esophageal cancer Neg Hx    Rectal cancer Neg Hx    Stomach cancer Neg Hx    Pancreatic cancer Neg Hx    Allergic rhinitis Neg Hx    Angioedema Neg Hx    Asthma Neg Hx    Atopy Neg Hx    Eczema Neg Hx    Immunodeficiency Neg Hx    Urticaria Neg Hx     Allergies   Allergen Reactions   Escitalopram Hives    Pt felt anxious  Pt felt his skin was "on fire"   Protonix [Pantoprazole Sodium] Other (See Comments)    Tingling of extremities    Medication list has been reviewed and updated.  Current Outpatient Medications on File Prior to Visit  Medication Sig Dispense Refill   Ascorbic Acid (VITAMIN C) 1000 MG tablet Take 1,000 mg by mouth daily.     b complex vitamins tablet Take 1 tablet by mouth daily. Every other day     cholecalciferol (VITAMIN D3) 25 MCG (1000 UT) tablet Take 1,000 Units by mouth daily.     Famotidine (PEPCID PO) Take by mouth as needed.     gabapentin (NEURONTIN) 100 MG capsule Take 1 capsule (100 mg total) by mouth 3 (three) times daily. 270 capsule 0   LORazepam (ATIVAN) 1 MG tablet Take 1 tablet (1 mg total) by mouth as needed for anxiety. May take 1 per day 30 tablet 0   rosuvastatin (CRESTOR) 20 MG tablet Take 1 tablet (20 mg total) by mouth daily. 90 tablet 0   No current facility-administered medications on file prior to visit.    Review of Systems:  As per HPI- otherwise negative.   Physical Examination: There were no vitals filed for this visit. There were no vitals filed for this visit. There is no height or weight on file to calculate BMI. Ideal Body Weight:    GEN: no acute distress. HEENT: Atraumatic, Normocephalic.  Ears and Nose: No external deformity. CV: RRR, No M/G/R. No JVD. No thrill. No extra heart sounds. PULM: CTA B, no wheezes, crackles, rhonchi. No retractions. No resp. distress. No accessory muscle use. ABD: S, NT, ND, +BS. No rebound. No HSM. EXTR: No c/c/e PSYCH: Normally interactive. Conversant.    Assessment and Plan: ***  Signed Abbe Amsterdam, MD

## 2024-01-06 NOTE — Progress Notes (Signed)
[facility] Medicine [facility]  Department of Family Medicine  Inpatient Progress Note      Name: Hector [name]  Age: 63 y.o.   Gender: male  MRN: [id number]  Date of Admission:  01/05/2024  PCP: Robin Searing, MD  Attending: Buren Kos, MD  Consultants:   Consult Orders Placed This Encounter   Procedures    IP CONSULT TO UROLOGY On-Call Provider (nurse/clerk to determine)       SUBJECTIVE:      Hospital day 1    Patient seen and evaluated at bedside. Patient denies abdominal pain and reports his hematuria is improving. He is hungry and is requesting something to eat. Discussed plan of care. He is also requesting a nicotine patch as he smokes 1 PPD. Patient denies palpitations, chest pain, shortness of breath, lightheadedness. Patient requested I call his SO [name]- I left a VM for her to call back.     Review of Systems:  All other ROS negative unless stated in HPI    OBJECTIVE     BP (!) 148/69   Pulse 63   Temp 36.3 C (97.4 F)   Resp 16   Ht 1.753 m (5\' 9" )   Wt 111 kg (244 lb 14.9 oz)   SpO2 94%   BMI 36.17 kg/m     PHYSICAL EXAM  General: appears chronically ill, mildly obese, appears older than stated age, and vital signs reviewed  Eyes: Conjunctiva clear., Pupils equal and round.   HENT:Head atraumatic and normocephalic, Nose without erythema. , Mouth mucous membranes dry.   Neck: supple, symmetrical, trachea midline  Lungs: Breathing nonlabored, Clear to auscultation bilaterally.   Cardiovascular: bradycardic in rate, no obvious murmur noted  Abdomen: Bowel sounds normal, distended, no pain to palpation  Extremities: extremities normal, atraumatic, no cyanosis or edema  Skin: Skin warm and dry  Psychiatric: Normal affect, behavior, memory, thought content, judgement, and speech.    Medications:    budesonide (PULMICORT RESPULES) 0.5 mg/2 mL nebulizer suspension, 0.5 mg, 2x/day **AND** arformoterol (BROVANA) 15 mcg/2 mL nebulizer solution, 15 mcg, 2x/day    [Held by provider] aspirin  (ECOTRIN) enteric coated tablet 81 mg, 81 mg, Daily    [Held by provider] clopidogrel (PLAVIX) 75 mg tablet, 75 mg, Daily    NS flush syringe, 10 mL, Q8HRS    OLANZapine (zyPREXA) tablet, 10 mg, NIGHTLY    pantoprazole (PROTONIX) delayed release tablet, 40 mg, 2x/day AC    rosuvastatin (CRESTOR) tablet, 10 mg, QPM    sertraline (ZOLOFT) tablet, 200 mg, Daily    tamsulosin (FLOMAX) capsule, 0.4 mg, Daily after Dinner    traZODone (DESYREL) tablet, 50 mg, NIGHTLY    Labs:  CBC:     4.0* (01/21 1317) \   13.5* (01/21 1317) /   68* (01/21 1317)      / 40.8 (01/21 1317) \      BMP:   139 (01/21 1317) 107 (01/21 1317) 16 (01/21 1317)    /     148* (01/21 1317)   4.1 (01/21 1317) 24 (01/21 1317) 1.55* (01/21 1317) \         In's and Out's:    Intake/Output Summary (Last 24 hours) at 01/06/2024 5366  Last data filed at 01/05/2024 2121  Gross per 24 hour   Intake 2580 ml   Output 750 ml   Net 1830 ml       Radiology Results:  No results found.    Microbiology:  No results found for any visits on 01/05/24 (from the past 96 hour(s)).    ASSESSMENT     Hector [name] is a 63 y.o. year-old male admitted for  Active Hospital Problems    Diagnosis    Primary Problem: Hematuria       PLAN   Hematuria and urinary incontinence/retention of indeterminate etiology, likely obstructive; r/o bladder malignancy/mass  Acute kidney injury, suspected due to obstructive uropathy (suspect intra-renal cause)  Initial diagnostics  WBC 3.8, Hgb 12.3- stable, likely 2/2 cirrhosis  INR 1.16, PT 13.0  CMP reveals improved creatinine 1.37 up from baseline 0.7-0.8, hyponatremia 2/2 dehydration  CK 45  Urinalysis notable for turbid frankly bloody specimen with elevated protein, urobilinogen; RBC TNTC, WBC elevated, moderate bacteria  Urine culture pending  Chest/abdomen/pelvis CT notable for mild fullness of right ureter and mild right hydronephrosis with soft tissue density near right ureterovesicular junction, possibly soft tissue mass vs clot; hepatic  cirrhosis with varices  Inpatient consult placed to Urology / Dr Idamae Lusher  Uroscopy/cystoscopy recommended; Dr. Nolon Bussing will perform next week after Plavix held for 7 days (Day 2/7)  Holding aspirin/clopidogrel per recommendations  Continue tamsulosin 0.4 mg qPM  CT IVP/urogram with IV contrast ordered  Dr. Nolon Bussing will place Foley  Bladder scan post void residual  Ceftriaxone 1 g given in ED; do not suspect infectious etiology so will not continue abx   NPO for potential procedure  AM CBC/BMP/Mg/Phos     Chronic obstructive pulmonary disease, not in acute exacerbation  On baseline 5L O2 NC  Initial diagnostics  Chest/abdomen/pelvis CT unremarkable of acute chest pathology  EKG notable for sinus rhythm, occasional supraventricular premature complexes, first degree AV block  Home inhalers/nebulizers ordered and adapted to hospital formulary  Albuterol 2.5 mg nebulization q6h PRN  Advair adapted to budesonide/arformoterol nebulization BID  Duo-Neb TID PRN  Nicotine 21mg  po daily     First degree AV block  Patient asymptomatic at this time  EKG revealed first degree AV block on admission  Telemetry ordered     Chronic conditions - continue home medications unless specified otherwise  Thrombocytopenia, 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 alcohol use disorder  EtOH <10; no CIWA protocol necessary at this time  Plts 53, leukopenia stable at 3.8  Tobacco use disorder  Begin nicotine transdermal patch q24h PRN  Anxiety/depression  Trazodone 50 mg nightly  Sertraline 200 mg daily  Olanzapine 10 mg nightly  Gastroesophageal reflux disease  Pantoprazole 40 mg BID    Diet: DIET NPO - SPECIFIC DATE & TIME  DVT/PE: SCDs/ Venodynes/Impulse boots d/t thrombocytopenia  Code Status: FULL CODE: ATTEMPT RESUSCITATION/CPR    Disposition: Home when medically stable    Willeen Cass, DO   01/06/2024, 06:33   Family Medicine Resident, PGY-1  [facility] Medicine [facility]     I saw and examined the patient.  I reviewed the resident's note.  I agree with  the findings and plan of care as documented in the resident's note.  Any exceptions/additions are edited/noted.  Discussed with Urology.  Aspirin and Plavix on hold.  Aware of the risks with carotid stent in July.  Carotid ultrasound after reviewed.  Urology is concerned of urologic malignancy but can not have procedure prior to 7 days from 01/05/2024 due to being on Plavix  Buren Kos, MD

## 2024-01-06 NOTE — Care Management Notes (Signed)
[facility]  Care Management Initial Evaluation    Patient Name: Akoni Morrisson  Date of Birth: 30-Jan-1961  Sex: male  Date/Time of Admission: 01/05/2024  1:19 PM  Room/Bed: 571/A  Payor: HEALTH PLAN MEDICAID / Plan: HEALTH PLAN MEDICAID / Product Type: Medicaid MC /   Primary Care Providers:  Robin Searing, MD, MD (General)    Pharmacy Info:   Preferred Pharmacy       [phone] [address] [facility] - [address]    [address] [place] [address]    Phone: [phone] Fax: [phone]    Hours: Not open 24 hours          Emergency Contact Info:   Extended Emergency Contact Information  Primary Emergency Contact: HUGHES,[name]  Address: [address]           [place], [place] [address] [place]  Home Phone: [phone]  Work Phone: [phone]  Mobile Phone: [phone]  Relation: Significant other  Preferred language: English  Interpreter needed? No    History:   Chet Uchida is a 63 y.o., male, admitted 01/05/24.    Height/Weight: 175.3 cm (5\' 9" ) / 111 kg (244 lb 14.9 oz)     LOS: 1 day   Admitting Diagnosis: COPD exacerbation (CMS HCC) [J44.1]    Assessment:      01/06/24 1025   Assessment Details   Assessment Type Admission   Date of Care Management Update 01/06/24   Readmission   Is this a readmission? No   Insurance Information/Type   Insurance type Medicaid   Employment/Financial   Patient has Prescription Coverage?  Yes        Name of Insurance Coverage for Medications Health Plan Medicaid   Financial/Environmental Concerns none   Living Environment   Select an age group to open "lives with" row.  Adult   Lives With significant other   Living Arrangements house   Able to Return to Prior Arrangements yes   Home Safety   Home Assessment: No Problems Identified   Home Accessibility stairs to enter home   Care Management Plan   Discharge Planning Status initial meeting   Projected Discharge Date 01/08/24   Discharge plan discussed with: Patient   Discharge Needs Assessment    Equipment Currently Used at Home oxygen   Discharge Facility/Level of Care Needs Home (Patient/Family Member/other)(code 1)   Transportation Available car;family or friend will provide   Referral Information   Admission Type inpatient   Arrived From home or self-care     Patient admitted on 01/05/24 for hematuria. Urology consulted. NPO. Possible inpatient cysto. CM met with patient at bedside. Home address, insurance and PCP verified. Patient lives in single story home with his significant other, [name]. Patient is independent in ADLs and uses 5L O2 continuous which is supplied through [employer]. Patient retired. Patient does not drive but s/o provides transportation when needed. Patient not currently under care of home health agency.     Discharge Plan:  Home (Patient/Family Member/other) (code 1)  Patient is a home plan and denies the need for home health     The patient will continue to be evaluated for developing discharge needs.     Case Manager: Rica Records, RN

## 2024-01-06 NOTE — Care Plan (Signed)
Patient denies pain at this time. CBI is draining light pink urine. VSS. He remains on 5 liters 02 NC. He is for repeat labs in the AM.

## 2024-01-06 NOTE — Care Plan (Signed)
Problem: Adult Inpatient Plan of Care  Goal: Plan of Care Review  Outcome: Ongoing (see interventions/notes)  Goal: Patient-Specific Goal (Individualized)  Outcome: Ongoing (see interventions/notes)  Goal: Absence of Hospital-Acquired Illness or Injury  Outcome: Ongoing (see interventions/notes)  Goal: Optimal Comfort and Wellbeing  Outcome: Ongoing (see interventions/notes)  Goal: Rounds/Family Conference  Outcome: Ongoing (see interventions/notes)     Problem: Urinary Elimination Management  Goal: Effective Urinary Elimination/Continence  Outcome: Ongoing (see interventions/notes)     Plan of care reviewed continued  Elesa Massed, RN

## 2024-01-06 NOTE — Care Plan (Signed)
Patient admitted from the ER with hematuria. He is NPO after MN for possible cysto. VSS. He denies pain. He is on 5 liters 02 NC, which is his baseline. He is having bloody urine and dysuria. His ASA and Plavix are on hold. He is for repeat labs in the AM.

## 2024-01-06 NOTE — Consults (Signed)
[facility]      Name: Hector [name] MRN:  [id number]   Date: 01/05/2024 Age: 63 y.o.        HISTORY OF PRESENT ILLNESS:  Patient is a 63 year old with a several month history of hematuria.  He denies dysuria, fever, prostate problems, prostate cancer, flank pain, or stones.        MEDS:  Albuterol   Aspirin   Plavix  Zyprexa   Protonix   Crestor   Zoloft   Flomax   Desyrel    ALLERGIES:  None    SOCIAL HX:  Patient smoked    FAMILY HX:  Unremarkable    PAST SURGICAL HX:  Carotid stent      PAST MEDICAL HX:  Alcoholism with cirrhosis  chronic obstructive pulmonary disease   Thrombocytopenia   Elevated cholesterol      PHYSICAL EXAM:  White male in no acute distress   Temperature 97.2 pulse 64 blood pressure 134/65   Back no CVA tenderness   Abdomen soft  No testicular masses      LABS:  White count 3.8   Hemoglobin 12.3   Platelets 53000   Creatinine 1.3   Urinalysis too numerous to count red cells 20-50 white cells per field   CT scan mild fullness right with mild hydronephrosis question mass right UVJ      IMPRESSIONS:  Gross hematuria   Abnormal CT with mild hydronephrosis and question right UVJ mass  History of cigarette use   History of alcoholism with cirrhosis   Thrombocytopenia   chronic obstructive pulmonary disease      PLAN:  A three-way Foley catheter was placed irrigated.  It was connected to CBI.  Consider CT urogram  Patient will need cystoscopy with possible TURBT after Plavix has worn off.  The thrombocytopenia will have to be dealt with.  He is aware of the risk of urothelial and bladder cancer and the associated with cigarette use.            Consulting Physician: Orlene Och, MD     Orlene Och, MD

## 2024-01-07 ENCOUNTER — Ambulatory Visit (INDEPENDENT_AMBULATORY_CARE_PROVIDER_SITE_OTHER): Payer: 59 | Admitting: Family Medicine

## 2024-01-07 ENCOUNTER — Other Ambulatory Visit (INDEPENDENT_AMBULATORY_CARE_PROVIDER_SITE_OTHER): Payer: Self-pay | Admitting: Internal Medicine

## 2024-01-07 VITALS — BP 124/70 | HR 88 | Temp 97.6°F | Resp 18 | Ht 64.0 in | Wt 150.0 lb

## 2024-01-07 DIAGNOSIS — E785 Hyperlipidemia, unspecified: Secondary | ICD-10-CM

## 2024-01-07 DIAGNOSIS — Z7982 Long term (current) use of aspirin: Secondary | ICD-10-CM

## 2024-01-07 DIAGNOSIS — R300 Dysuria: Secondary | ICD-10-CM

## 2024-01-07 DIAGNOSIS — N179 Acute kidney failure, unspecified: Secondary | ICD-10-CM

## 2024-01-07 DIAGNOSIS — R8290 Unspecified abnormal findings in urine: Secondary | ICD-10-CM

## 2024-01-07 DIAGNOSIS — R748 Abnormal levels of other serum enzymes: Secondary | ICD-10-CM | POA: Diagnosis not present

## 2024-01-07 LAB — BASIC METABOLIC PANEL
ANION GAP: 4 mmol/L — ABNORMAL LOW (ref 5–19)
BUN/CREA RATIO: 17 (ref 6–20)
BUN: 24 mg/dL — ABNORMAL HIGH (ref 9–20)
CALCIUM: 8.3 mg/dL — ABNORMAL LOW (ref 8.4–10.2)
CHLORIDE: 113 mmol/L — ABNORMAL HIGH (ref 98–107)
CO2 TOTAL: 24 mmol/L (ref 22–30)
CREATININE: 1.41 mg/dL — ABNORMAL HIGH (ref 0.66–1.20)
ESTIMATED GFR: 56 mL/min/{1.73_m2} — ABNORMAL LOW (ref >60)
GLUCOSE: 146 mg/dL — ABNORMAL HIGH (ref 74–106)
POTASSIUM: 4.4 mmol/L (ref 3.5–5.1)
SODIUM: 141 mmol/L (ref 137–145)

## 2024-01-07 LAB — CBC
HCT: 36.7 % (ref 36.0–46.0)
HGB: 12.2 g/dL — ABNORMAL LOW (ref 13.9–16.3)
MCH: 32.1 pg (ref 25.4–34.0)
MCHC: 33.4 g/dL (ref 30.0–37.0)
MCV: 96.1 fL (ref 80.0–100.0)
MPV: 8.6 fL (ref 7.5–11.5)
PLATELETS: 57 10*3/uL — ABNORMAL LOW (ref 130–400)
RBC: 3.82 10*6/uL — ABNORMAL LOW (ref 4.30–5.90)
RDW: 16.4 % — ABNORMAL HIGH (ref 11.5–14.0)
WBC: 4.5 10*3/uL (ref 4.5–11.5)

## 2024-01-07 LAB — IRON AND TOTAL IRON BINDING CAPACITY
IRON (TRANSFERRIN) SATURATION: 15 % (ref 11–46)
IRON: 47 ug/dL — ABNORMAL LOW (ref 49–181)
TOTAL IRON BINDING CAPACITY: 320 ug/dL (ref 261–462)

## 2024-01-07 LAB — CYTOPATHOLOGY, URINARY

## 2024-01-07 LAB — VITAMIN B12: VITAMIN B 12: 781 pg/mL (ref 239–931)

## 2024-01-07 LAB — FERRITIN: FERRITIN: 24 ng/mL (ref 18–464)

## 2024-01-07 LAB — FOLATE: FOLATE: 6.8 ng/mL (ref >2.8)

## 2024-01-07 LAB — URINE CULTURE,ROUTINE: URINE CULTURE: NO GROWTH

## 2024-01-07 NOTE — Care Management Notes (Signed)
[facility]  Care Management Initial Evaluation    Patient Name: Hector [name]  Date of Birth: 1961/06/17  Sex: male  Date/Time of Admission: 01/05/2024  1:19 PM  Room/Bed: 571/A  Payor: HEALTH PLAN MEDICAID / Plan: HEALTH PLAN MEDICAID / Product Type: Medicaid MC /   Primary Care Providers:  Robin Searing, MD, MD (General)    Pharmacy Info:   Preferred Pharmacy       [phone] [address] [facility] - [address]    [address] [place] [address]    Phone: [phone] Fax: [phone]    Hours: Not open 24 hours          Emergency Contact Info:   Extended Emergency Contact Information  Primary Emergency Contact: HUGHES,[name]  Address: [address]           [place], [place] [address] [place]  Home Phone: [phone]  Work Phone: [phone]  Mobile Phone: [phone]  Relation: Significant other  Preferred language: English  Interpreter needed? No    History:   Hector [name] is a 63 y.o., male, admitted 01/05/24.    Height/Weight: 175.3 cm (5\' 9" ) / 111 kg (244 lb 14.9 oz)     LOS: 2 days   Admitting Diagnosis: COPD exacerbation (CMS HCC) [J44.1]    Assessment:      01/06/24 1025   Assessment Details   Assessment Type Admission   Date of Care Management Update 01/06/24   Readmission   Is this a readmission? No   Insurance Information/Type   Insurance type Medicaid   Employment/Financial   Patient has Prescription Coverage?  Yes        Name of Insurance Coverage for Medications Health Plan Medicaid   Financial/Environmental Concerns none   Living Environment   Select an age group to open "lives with" row.  Adult   Lives With significant other   Living Arrangements house   Able to Return to Prior Arrangements yes   Home Safety   Home Assessment: No Problems Identified   Home Accessibility stairs to enter home   Care Management Plan   Discharge Planning Status initial meeting   Projected Discharge Date 01/08/24   Discharge plan discussed with: Patient   Discharge Needs Assessment    Equipment Currently Used at Home oxygen   Discharge Facility/Level of Care Needs Home (Patient/Family Member/other)(code 1)   Transportation Available car;family or friend will provide   Referral Information   Admission Type inpatient   Arrived From home or self-care     Patient admitted on 01/05/24 for hematuria. Urology consulted. NPO. Possible inpatient cysto. CM met with patient at bedside. Home address, insurance and PCP verified. Patient lives in single story home with his significant other, [name]. Patient is independent in ADLs and uses 5L O2 continuous which is supplied through [employer]. Patient retired. Patient does not drive but s/o provides transportation when needed. Patient not currently under care of home health agency.     01/07/24 (1030) Patient discussed in discharge plannig. Three way foley catheter placed and CBI started. Urology following. Not anticipated for discharge at this time.     Discharge Plan:  Home (Patient/Family Member/other) (code 1)  Patient is a home plan and denies the need for home health     The patient will continue to be evaluated for developing discharge needs.     Case Manager: Rica Records, RN

## 2024-01-07 NOTE — Care Plan (Signed)
Problem: Adult Inpatient Plan of Care  Goal: Plan of Care Review  Outcome: Ongoing (see interventions/notes)  Goal: Patient-Specific Goal (Individualized)  Outcome: Ongoing (see interventions/notes)  Flowsheets (Taken 01/07/2024 2100)  Individualized Care Needs: maintain catheter patency  Anxieties, Fears or Concerns: denies  Goal: Absence of Hospital-Acquired Illness or Injury  Outcome: Ongoing (see interventions/notes)  Intervention: Identify and Manage Fall Risk  Recent Flowsheet Documentation  Taken 01/07/2024 2100 by Leonette Monarch, RN  Safety Promotion/Fall Prevention: fall prevention program maintained  Intervention: Prevent and Manage VTE (Venous Thromboembolism) Risk  Recent Flowsheet Documentation  Taken 01/07/2024 2100 by Leonette Monarch, RN  VTE Prevention/Management: patient refused intervention  Intervention: Prevent Infection  Recent Flowsheet Documentation  Taken 01/07/2024 2100 by Leonette Monarch, RN  Infection Prevention: rest/sleep promoted  Goal: Optimal Comfort and Wellbeing  Outcome: Ongoing (see interventions/notes)  Intervention: Provide Person-Centered Care  Recent Flowsheet Documentation  Taken 01/07/2024 2100 by Leonette Monarch, RN  Trust Relationship/Rapport:   care explained   choices provided  Goal: Rounds/Family Conference  Outcome: Ongoing (see interventions/notes)     Problem: Urinary Elimination Management  Goal: Effective Urinary Elimination/Continence  Outcome: Ongoing (see interventions/notes)

## 2024-01-07 NOTE — Care Plan (Signed)
Problem: Adult Inpatient Plan of Care  Goal: Plan of Care Review  Outcome: Ongoing (see interventions/notes)  Goal: Patient-Specific Goal (Individualized)  Outcome: Ongoing (see interventions/notes)  Goal: Absence of Hospital-Acquired Illness or Injury  Outcome: Ongoing (see interventions/notes)  Goal: Optimal Comfort and Wellbeing  Outcome: Ongoing (see interventions/notes)  Goal: Rounds/Family Conference  Outcome: Ongoing (see interventions/notes)     Problem: Urinary Elimination Management  Goal: Effective Urinary Elimination/Continence  Outcome: Ongoing (see interventions/notes)     Plan of care reviewed and continued  Elesa Massed, RN

## 2024-01-07 NOTE — Progress Notes (Signed)
[facility] Medicine [facility]  Department of Family Medicine  Inpatient Progress Note      Name: Hector [name]  Age: 63 y.o.   Gender: male  MRN: [id number]  Date of Admission:  01/05/2024  PCP: Robin Searing, MD  Attending: Buren Kos, MD  Consultants:   Consult Orders Placed This Encounter   Procedures    IP CONSULT TO UROLOGY On-Call Provider (nurse/clerk to determine)       SUBJECTIVE:      Hospital day 2    Patient seen and evaluated at bedside. No acute events reported overnight.  No new acute complaints today. He is tolerating p.o. intake. He has not had a BM yet since admission. He fears going to the bathroom while he has a CBI on. He denies fever, chills, headache, chest pain, shortness of breath, cough, abdominal pain, nausea, vomiting, changes in urinary or bowel habits.    Review of Systems:  As above    OBJECTIVE     BP (!) 143/76   Pulse 63   Temp 36.4 C (97.6 F)   Resp 18   Ht 1.753 m (5\' 9" )   Wt 111 kg (244 lb 14.9 oz)   SpO2 94%   BMI 36.17 kg/m     PHYSICAL EXAM  General: appears chronically ill, appears older than stated age, moderately obese, and no distress  Eyes: Conjunctiva clear., Pupils equal and round.   HENT:ENMT without erythema or injection, mucous membranes slightly dry.  Neck: supple, symmetrical, trachea midline  Lungs: Breathing nonlabored, Clear to auscultation bilaterally.   Cardiovascular: regular rate and rhythm, S1, S2 normal, no murmur, click, rub or gallop  Abdomen: mildly distended, Soft, non-tender, Bowel sounds normal  Genitourinary: CBI running, clear, cherry red urine  Extremities: extremities normal, atraumatic, no cyanosis or edema  Skin: Skin warm and dry  Psychiatric: Normal affect, behavior, memory, thought content, judgement, and speech.    Medications:    budesonide (PULMICORT RESPULES) 0.5 mg/2 mL nebulizer suspension, 0.5 mg, 2x/day **AND** arformoterol (BROVANA) 15 mcg/2 mL nebulizer solution, 15 mcg, 2x/day    [Held by provider] aspirin  (ECOTRIN) enteric coated tablet 81 mg, 81 mg, Daily    [Held by provider] clopidogrel (PLAVIX) 75 mg tablet, 75 mg, Daily    nicotine (NICODERM CQ) transdermal patch (mg/24 hr), 21 mg, Daily    NS flush syringe, 10 mL, Q8HRS    OLANZapine (zyPREXA) tablet, 10 mg, NIGHTLY    pantoprazole (PROTONIX) delayed release tablet, 40 mg, 2x/day AC    rosuvastatin (CRESTOR) tablet, 10 mg, QPM    sertraline (ZOLOFT) tablet, 200 mg, Daily    tamsulosin (FLOMAX) capsule, 0.4 mg, Daily after Dinner    traZODone (DESYREL) tablet, 50 mg, NIGHTLY    Labs:  CBC:     4.5 (01/23 5409) \   12.2* (01/23 8119) /   57* (01/23 1478)      / 36.7 (01/23 2956) \      BMP:   141 (01/23 2130) 113* (01/23 8657) 24* (01/23 8469)    /     146* (01/23 6295)   4.4 (01/23 2841) 24 (01/23 3244) 1.41* (01/23 0102) \         In's and Out's:    Intake/Output Summary (Last 24 hours) at 01/07/2024 1046  Last data filed at 01/07/2024 0855  Gross per 24 hour   Intake 72536 ml   Output 26600 ml   Net -2600 ml     Radiology Results:  CT CHEST ABDOMEN PELVIS WO IV CONTRAST    Result Date: 01/05/2024  Impression MILD FULLNESS OF THE RIGHT URETER AND MILD RIGHT HYDRONEPHROSIS. THERE IS SOFT TISSUE DENSITY NEAR THE RIGHT URETEROVESICULAR JUNCTION WHICH COULD BE SOFT TISSUE MASS VERSUS BLOOD CLOT. UROLOGY FOLLOW-UP IS RECOMMENDED. MODERATE EMPHYSEMA. NO ACUTE FINDINGS IN THE CHEST. 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 NO ASCITES. One or more dose reduction techniques were used (e.g., Automated exposure control, adjustment of the mA and/or kV according to patient size, use of iterative reconstruction technique). Radiologist location ID: [id number]      Microbiology:  Hospital Encounter on 01/05/24 (from the past 96 hour(s))   URINE CULTURE,ROUTINE    Collection Time: 01/05/24  2:05 PM    Specimen: Urine, Site not specified   Culture Result Status    URINE CULTURE No Growth 18-24 hrs. Preliminary     ASSESSMENT     Hector [name] is a 63  y.o. year-old male admitted for  Active Hospital Problems    Diagnosis    Primary Problem: Hematuria    AKI (acute kidney injury) (CMS HCC)     PLAN   Painless hematuria and urinary incontinence/retention of indeterminate etiology, likely obstructive; r/o bladder malignancy/mass  Acute kidney injury, suspected due to obstructive uropathy (suspect intra-renal cause)  Initial diagnostics  WBC 4.5, Hgb 12.3- stable, likely 2/2 cirrhosis  INR 1.16, PT 13.0  CMP: creatinine 1.41 up from baseline 0.7-0.8, hyponatremia 2/2 dehydration  CK 45  Urinalysis notable for turbid frankly bloody specimen with elevated protein, urobilinogen; RBC TNTC, WBC elevated, moderate bacteria  Urine culture: no growth x 2 days  Chest/abdomen/pelvis CT notable for mild fullness of right ureter and mild right hydronephrosis with soft tissue density near right ureterovesicular junction, possibly soft tissue mass vs clot; hepatic cirrhosis with varices  CT Urogram: persistent ovoid soft tissue fullness measuring 1.7 cm at the level of the UVJ.   Urine cystology revealing atypical urothelial cells.   Inpatient consult placed to Urology / Dr Idamae Lusher  Uroscopy/cystoscopy recommended; Dr. Nolon Bussing will perform next week after Plavix held for 7 days (Day 3/7)  Holding aspirin/clopidogrel per recommendations  Referral placed to Uro/Onc at [facility] for further evaluation of malignancy, will need diagnostic ureteroscopy and biopsy  Continue tamsulosin 0.4 mg qPM  Foley and CBI running  Bladder scan post void residual  Ceftriaxone 1 g given in ED 1/21; do not suspect infectious etiology so will not continue abx   AM CBC/BMP/Mg     Chronic obstructive pulmonary disease, not in acute exacerbation  On baseline 5L O2 NC  Initial diagnostics  Chest/abdomen/pelvis CT unremarkable of acute chest pathology  EKG notable for sinus rhythm, occasional supraventricular premature complexes, first degree AV block  Home inhalers/nebulizers ordered and adapted to hospital  formulary  Albuterol 2.5 mg nebulization q6h PRN  Advair adapted to budesonide/arformoterol nebulization BID  Duo-Neb TID PRN  Nicotine patch 21mg  po daily      Mild anemia, likely related to alcohol use disorder and liver cirrhosis  Ordered Iron studies, folate, B12, Folate, Thiamine  AM CBC    First degree AV block  Patient asymptomatic at this time  EKG revealed first degree AV block on admission  Continuous telemetry     Chronic conditions - continue home medications unless specified otherwise  Thrombocytopenia, 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 alcohol use disorder  EtOH <10; no CIWA protocol necessary at this time  Plts 57, leukopenia stable at 4.5  Tobacco use disorder  Begin  nicotine transdermal patch q24h PRN  Anxiety/depression  Trazodone 50 mg nightly  Sertraline 200 mg daily  Olanzapine 10 mg nightly  Gastroesophageal reflux disease  Pantoprazole 40 mg BID    Diet: DIET REGULAR Carb Amount: CARB CONSISTENT 45-60 GM/MEAL  DVT/PE: SCDs due to hematuria, thrombocytopneia  Code Status: FULL CODE: ATTEMPT RESUSCITATION/CPR    Disposition: Home when medically stable    Disclaimer: This note was partially created using voice recognition software and is inherently subject to errors including those of syntax and "sound alike" substitutions which may escape proof reading. In these instances, original meaning may be extrapolated by the contextual derivation. If typographical errors noted and/or phrasing does not make sense, please contact me for corrections. I reserve the right to change note to correct mistakes secondary to MModal misinterpretation.     Isa Kohlenberg Ollen Barges, MD   01/07/2024, 08:45   Family Medicine Resident, PGY-2  [facility] Medicine [facility]    I saw and examined the patient.  I reviewed the resident's note.  I agree with the findings and plan of care as documented in the resident's note.  Any exceptions/additions are edited/noted.    Buren Kos, MD

## 2024-01-07 NOTE — Consults (Signed)
[facility]          UROLOGY CONSULT NOTE    Patient: Hector, [name], 63 y.o. male  Date of Admission:  01/05/2024  Date of Birth:  September 28, 1961  Date of Service:  01/07/2024    HPI: Hector [name] is a 63 y.o. male who presented to the ED with hematuria and painful urination. Patient has a PMH significant for ETOH abuse, carotid stenosis, chronic obstructive pulmonary disease, cirrhosis, alcoholic delirium, depression, encephalopathy, gastroesophageal reflux disease, hypertension, hyperlipidemia, ileus, 5 L oxygen dependent, pleural effusion, and thrombocytopenia.    Patient presented to the ER today due to hematuria x3 months with clots and painful urination. Patient states that the pain occurs at the end of his stream.  He states that he does not feel as if he ever completely empties his bladder.  He complains of a weak stream.  Patient states that he has to "squeeze to stop" his urinary stream. Currently he is experiencing right CVA tenderness and does not recall how long that has been occurring. He admits to a history of urinary retention, but does not recall whether or not he has required a catheter. He does experience incontinence on occasion. Patient denies any previous occurrences of gross hematuria, kidney stones, or previous urologic concern.     Patient states that he has smoked cigarettes since he was 63 years old. He denies any previous prostate evaluations. He denies any family history of prostate, bladder or kidney cancer. He denies any family history of kidney stones.     CT A/P on admission revealed mild fullness of the right ureter and mild right hydronephrosis with a soft tissue density at the right ureterovesicular junction.    According to patient's medication list, it appears he is taking ASA, Plavix and Flomax.     WBC: 4.0  Hgb: 12.5   Creatinine: 1.55  UA: TNTC RBC, 21-50 WBC    01/22: 3 way foley catheter placed by Dr. Nolon Bussing with CBI  initiated. CT IVP ordered by the medicine service.      01/23: CBI running at a slow rate with pink tinged urine. Manually irrigated with no clots noted. CT IVP revealed mildly delayed right renal enhancement. There is a persistent ovoid soft tissue fullness measuring 1.7 cm at the level of the UVJ. Urine cystology revealing atypical urothelial cells.     Vital Signs:  Temperature: 36.3 C (97.4 F) (01/07/24 1323)  BP (Non-Invasive): 106/72 (01/07/24 1417)  MAP (Non-Invasive): 116 mmHG (01/07/24 1324)  Heart Rate: 75 (01/07/24 1324)  Respiratory Rate: 20 (01/07/24 1324)  SpO2: 93 % (01/07/24 1324)    ROS: As per HPI    Past Medical History:   Diagnosis Date    Alcohol abuse     Carotid stenosis     Chronic obstructive airway disease (CMS HCC)     Cirrhosis (CMS HCC)     Closed fracture of transverse process of thoracic vertebra (CMS HCC) 05/01/2021    Coma (CMS HCC)     after fall due to alcholism May 2022    Delirium, withdrawal, alcoholic (CMS HCC)     Depression     Encephalopathy 05/15/2021    Esophageal reflux     HTN (hypertension)     Hyperlipidemia     Ileus (CMS HCC) 05/18/2021    Left pulmonary contusion 05/01/2021    Leukopenia 05/18/2021    Multiple rib fractures 05/01/2021    Oxygen dependent  Pleural effusion 05/11/2021    Serum ammonia increased (CMS HCC) 05/15/2021    Shortness of breath     Spleen injury 05/01/2021    Status post thoracentesis 05/19/2021    Thrombocytopenia (CMS HCC) 05/01/2021    Unknown cause of injury     fx nose    Wears glasses      Past Surgical History:   Procedure Laterality Date    CAROTID STENT      COLONOSCOPY  08/2021    ESOPHAGOGASTRODUODENOSCOPY  08/2021    HX OTHER Right 06/29/2023    TCAR    HX TONSILLECTOMY      OTHER SURGICAL HISTORY      HX of plastic surgery to right head     Family Medical History:       Problem Relation (Age of Onset)    Asthma Father    Cerebral Aneurysm Sister    Dementia Mother    Heart Attack Paternal Grandmother (85)          Social  History     Tobacco Use    Smoking status: Every Day     Current packs/day: 1.00     Average packs/day: 1 pack/day for 51.1 years (51.1 ttl pk-yrs)     Types: Cigarettes     Start date: 1974    Smokeless tobacco: Never    Tobacco comments:     THE PATIENT STARTED    Substance Use Topics    Alcohol use: Not Currently     Alcohol/week: 15.0 standard drinks of alcohol     Types: 15 Cans of beer per week     Comment:  may 17th, 2022      Medications: albuterol (PROVENTIL) 2.5 mg / 3 mL (0.083%) neb solution, 2.5 mg, Nebulization, Q6H PRN  budesonide (PULMICORT RESPULES) 0.5 mg/2 mL nebulizer suspension, 0.5 mg, Nebulization, 2x/day   And  arformoterol (BROVANA) 15 mcg/2 mL nebulizer solution, 15 mcg, Nebulization, 2x/day  [Held by provider] aspirin (ECOTRIN) enteric coated tablet 81 mg, 81 mg, Oral, Daily  [Held by provider] clopidogrel (PLAVIX) 75 mg tablet, 75 mg, Oral, Daily  ipratropium-albuterol 0.5 mg-3 mg(2.5 mg base)/3 mL Solution for Nebulization, 3 mL, Nebulization, 3x/day PRN  nicotine (NICODERM CQ) transdermal patch (mg/24 hr), 14 mg, Transdermal, Daily PRN  nicotine (NICODERM CQ) transdermal patch (mg/24 hr), 21 mg, Transdermal, Daily  NS flush syringe, 10 mL, Intracatheter, Q8HRS  NS flush syringe, 10 mL, Intracatheter, Q1H PRN  OLANZapine (zyPREXA) tablet, 10 mg, Oral, NIGHTLY  pantoprazole (PROTONIX) delayed release tablet, 40 mg, Oral, 2x/day AC  rosuvastatin (CRESTOR) tablet, 10 mg, Oral, QPM  sertraline (ZOLOFT) tablet, 200 mg, Oral, Daily  tamsulosin (FLOMAX) capsule, 0.4 mg, Oral, Daily after Dinner  traZODone (DESYREL) tablet, 50 mg, Oral, NIGHTLY      Allergies   Allergen Reactions    Bee Venom Protein (Honey Bee)      Bee stings     Physical exam:  General Appearance:  Well developed, well nourished. No acute distress.    Skin: Warm and dry without rashes.  HEENT:  Normocephalic, atraumatic.  Sclerae nonicteric, conjunctiva pink,  Extraocular movements intact. Mucous membranes moist.  Neck:  Trachea midline.  No thyromegaly visible  Respiratory:  Respirations nonlabored, chest wall expansion symmetrical. 5L NC.  Cardiovascular:  Extremities warm, well perfused, no edema.  Back: Right CVA tenderness (+)  Abdomen:  Soft, nontender, nondistended.    Psychiatric: Normal mood and affect.  Neurologic: Awake, alert and oriented x3  Genitourinary:  Deferred    Recent Results last 24 hours     01/07/24  0938   WBC 4.5   HGB 12.2*   HCT 36.7   MCV 96.1   PLTCNT 57*     Recent Results last 24 hours     01/07/24  0938   SODIUM 141   POTASSIUM 4.4   CHLORIDE 113*   CO2 24   BUN 24*   CREATININE 1.41*   CALCIUM 8.3*   GLUCOSENF 146*     Urine Dip  LEUKOCYTES   Date Value Ref Range Status   01/05/2024 Negative Negative WBCs/uL Final     NITRITE   Date Value Ref Range Status   01/05/2024 Negative Negative Final     UROBILINOGEN   Date Value Ref Range Status   01/05/2024 3.0 (A) < 2.0, 1.0, 0.2  mg/dL Final     PROTEIN   Date Value Ref Range Status   01/05/2024 100 (A) Negative, 10 , 20  mg/dL Final     PH (ARTERIAL)   Date Value Ref Range Status   05/19/2021 7.43 7.35 - 7.45 Final     BILIRUBIN   Date Value Ref Range Status   01/05/2024 Negative Negative mg/dL Final     GLUCOSE   Date Value Ref Range Status   01/05/2024 Negative 30 , Negative mg/dL Final     CT A/P 62/13/08:    Narrative & Impression   Rieley LEE Malmstrom     RADIOLOGIST: Truman Hayward, MD     CT CHEST ABDOMEN PELVIS WO IV CONTRAST performed on 01/05/2024 1:44 PM     CLINICAL HISTORY: sob, abd pain, hematuria.       blood in urine for months, reports dark clots and pain     TECHNIQUE:  Chest, abdomen and pelvis CT without intravenous contrast.      CT ABDOMEN/PELVIS:  Kidneys:   There is mild fullness of the right ureter and mild right hydronephrosis. There appears to be soft tissue density towards the right ureterovesicular junction seen on axial image 230 which is nonspecific. A soft tissue mass is not excluded. Urology follow-up recommended.  The left ureter and kidney are unremarkable.  Bladder:  Unremarkable.  Prostate:  Unremarkable.  IMPRESSION:  MILD FULLNESS OF THE RIGHT URETER AND MILD RIGHT HYDRONEPHROSIS. THERE IS SOFT TISSUE DENSITY NEAR THE RIGHT URETEROVESICULAR JUNCTION WHICH COULD BE SOFT TISSUE MASS VERSUS BLOOD CLOT. UROLOGY FOLLOW-UP IS RECOMMENDED.     MODERATE EMPHYSEMA. NO ACUTE FINDINGS IN THE CHEST.     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 NO ASCITES.         Assessment/Plan:   Right hydronephrosis  Gross hematuria   Dysuria  Abnormal urine cytology  Abnormal CT scan   Right CVA tenderness  Tobacco dependency     -Discussed patient with Dr. Idamae Lusher. Will place referral for Urologic Oncology at [facility] for further evaluation of possible malignancy in view of hematuria and abnormal CT. Patient will need diagnostic ureteroscopy and biopsy.   -CBI to run to ensure urine is clear-pink with no clots. Manually irrigate PRN for clot evacuation. Notify urology if catheter becomes obstructed.   -Consider tamsulosin 0.4 mg as tolerated.  -Would recommend holding anticoagulants if no contraindications from primary team.    I independently of the faculty provider spent a total of 35 minutes in direct care of this patient including initial evaluation, review of laboratory, Radiology, diagnostic studies, review of medical record, order  entry and coordination of care.    Orvil Feil, APRN-BC  Advanced Practice Provider  [facility] Medicine [facility]  Department of Urology    Portions of this note may be dictated using voice recognition software or a dictation device.  Variances in spelling and vocabulary are possible and unintentional.  Not all errors are caught and corrected.  Please notify the Thereasa Parkin if any discrepancies are noted or if the meaning of any statement is not clear.

## 2024-01-08 ENCOUNTER — Other Ambulatory Visit: Payer: 59

## 2024-01-08 ENCOUNTER — Inpatient Hospital Stay (HOSPITAL_COMMUNITY): Payer: MEDICAID

## 2024-01-08 DIAGNOSIS — R140 Abdominal distension (gaseous): Secondary | ICD-10-CM

## 2024-01-08 LAB — BASIC METABOLIC PANEL
ANION GAP: 5 mmol/L (ref 5–19)
BUN/CREA RATIO: 17 (ref 6–20)
BUN: 22 mg/dL — ABNORMAL HIGH (ref 9–20)
CALCIUM: 8.4 mg/dL (ref 8.4–10.2)
CHLORIDE: 111 mmol/L — ABNORMAL HIGH (ref 98–107)
CO2 TOTAL: 24 mmol/L (ref 22–30)
CREATININE: 1.3 mg/dL — ABNORMAL HIGH (ref 0.66–1.20)
ESTIMATED GFR: >60 mL/min/{1.73_m2} (ref >60)
GLUCOSE: 179 mg/dL — ABNORMAL HIGH (ref 74–106)
POTASSIUM: 4.2 mmol/L (ref 3.5–5.1)
SODIUM: 140 mmol/L (ref 137–145)

## 2024-01-08 LAB — MAGNESIUM: MAGNESIUM: 1.8 mg/dL (ref 1.6–2.3)

## 2024-01-08 LAB — CBC
HCT: 37.3 % (ref 36.0–46.0)
HGB: 12.6 g/dL — ABNORMAL LOW (ref 13.9–16.3)
MCH: 32.1 pg (ref 25.4–34.0)
MCHC: 33.9 g/dL (ref 30.0–37.0)
MCV: 94.8 fL (ref 80.0–100.0)
MPV: 9.3 fL (ref 7.5–11.5)
PLATELETS: 61 10*3/uL — ABNORMAL LOW (ref 130–400)
RBC: 3.93 10*6/uL — ABNORMAL LOW (ref 4.30–5.90)
RDW: 15.9 % — ABNORMAL HIGH (ref 11.5–14.0)
WBC: 3.6 10*3/uL — ABNORMAL LOW (ref 4.5–11.5)

## 2024-01-08 LAB — LAB REPORT - SCANNED: Lipase: 90

## 2024-01-08 MED ORDER — ACETAMINOPHEN 1,000 MG/100 ML (10 MG/ML) INTRAVENOUS SOLUTION
1000.0000 mg | Freq: Four times a day (QID) | INTRAVENOUS | Status: AC
Start: 2024-01-08 — End: 2024-01-09
  Administered 2024-01-08 (×3): 1000 mg via INTRAVENOUS
  Administered 2024-01-08 – 2024-01-09 (×4): 0 mg via INTRAVENOUS
  Administered 2024-01-09: 1000 mg via INTRAVENOUS
  Filled 2024-01-08 (×3): qty 100

## 2024-01-08 NOTE — Consults (Signed)
[facility]          UROLOGY CONSULT NOTE    Patient: Hector [name], Hector [name], 63 y.o. male  Date of Admission:  01/05/2024  Date of Birth:  1961/09/18  Date of Service:  01/08/2024    HPI: Hector [name] is a 63 y.o. male who presented to the ED with hematuria and painful urination. Patient has a PMH significant for ETOH abuse, carotid stenosis, chronic obstructive pulmonary disease, cirrhosis, alcoholic delirium, depression, encephalopathy, gastroesophageal reflux disease, hypertension, hyperlipidemia, ileus, 5 L oxygen dependent, pleural effusion, and thrombocytopenia.    Patient presented to the ER today due to hematuria x3 months with clots and painful urination. Patient states that the pain occurs at the end of his stream.  He states that he does not feel as if he ever completely empties his bladder.  He complains of a weak stream.  Patient states that he has to "squeeze to stop" his urinary stream. Currently he is experiencing right CVA tenderness and does not recall how long that has been occurring. He admits to a history of urinary retention, but does not recall whether or not he has required a catheter. He does experience incontinence on occasion. Patient denies any previous occurrences of gross hematuria, kidney stones, or previous urologic concern.     Patient states that he has smoked cigarettes since he was 63 years old. He denies any previous prostate evaluations. He denies any family history of prostate, bladder or kidney cancer. He denies any family history of kidney stones.     CT A/P on admission revealed mild fullness of the right ureter and mild right hydronephrosis with a soft tissue density at the right ureterovesicular junction.    According to patient's medication list, it appears he is taking ASA, Plavix and Flomax.     WBC: 4.0  Hgb: 12.5   Creatinine: 1.55  UA: TNTC RBC, 21-50 WBC    01/22: 3 way foley catheter placed by Dr. Nolon Bussing with CBI  initiated. CT IVP ordered by the medicine service.      01/23: CBI running at a slow rate with pink tinged urine. Manually irrigated with no clots noted. CT IVP revealed mildly delayed right renal enhancement. There is a persistent ovoid soft tissue fullness measuring 1.7 cm at the level of the UVJ. Urine cystology revealing atypical urothelial cells.     01/24: Patient continues on CBI with urine bloody in color. Continue CBI to ensure urine pink-clear without clots. Appointment made at [facility] for 01/28/24 for further evaluation and treatment. Patient is afebrile on exam.    Vital Signs:  Temperature: 36.7 C (98 F) (01/08/24 0901)  BP (Non-Invasive): (!) 151/70 (01/08/24 0913)  MAP (Non-Invasive): 116 mmHG (01/08/24 0901)  Heart Rate: 75 (01/08/24 0901)  Respiratory Rate: 18 (01/08/24 0901)  SpO2: 98 % (01/08/24 0901)    ROS: As per HPI    Past Medical History:   Diagnosis Date    Alcohol abuse     Carotid stenosis     Chronic obstructive airway disease (CMS HCC)     Cirrhosis (CMS HCC)     Closed fracture of transverse process of thoracic vertebra (CMS HCC) 05/01/2021    Coma (CMS HCC)     after fall due to alcholism May 2022    Delirium, withdrawal, alcoholic (CMS HCC)     Depression     Encephalopathy 05/15/2021    Esophageal reflux     HTN (hypertension)  Hyperlipidemia     Ileus (CMS HCC) 05/18/2021    Left pulmonary contusion 05/01/2021    Leukopenia 05/18/2021    Multiple rib fractures 05/01/2021    Oxygen dependent     Pleural effusion 05/11/2021    Serum ammonia increased (CMS HCC) 05/15/2021    Shortness of breath     Spleen injury 05/01/2021    Status post thoracentesis 05/19/2021    Thrombocytopenia (CMS HCC) 05/01/2021    Unknown cause of injury     fx nose    Wears glasses      Past Surgical History:   Procedure Laterality Date    CAROTID STENT      COLONOSCOPY  08/2021    ESOPHAGOGASTRODUODENOSCOPY  08/2021    HX OTHER Right 06/29/2023    TCAR    HX TONSILLECTOMY      OTHER SURGICAL HISTORY       HX of plastic surgery to right head     Family Medical History:       Problem Relation (Age of Onset)    Asthma Father    Cerebral Aneurysm Sister    Dementia Mother    Heart Attack Paternal Grandmother (9)          Social History     Tobacco Use    Smoking status: Every Day     Current packs/day: 1.00     Average packs/day: 1 pack/day for 51.1 years (51.1 ttl pk-yrs)     Types: Cigarettes     Start date: 1974    Smokeless tobacco: Never    Tobacco comments:     THE PATIENT STARTED    Substance Use Topics    Alcohol use: Not Currently     Alcohol/week: 15.0 standard drinks of alcohol     Types: 15 Cans of beer per week     Comment:  may 17th, 2022      Medications: albuterol (PROVENTIL) 2.5 mg / 3 mL (0.083%) neb solution, 2.5 mg, Nebulization, Q6H PRN  budesonide (PULMICORT RESPULES) 0.5 mg/2 mL nebulizer suspension, 0.5 mg, Nebulization, 2x/day   And  arformoterol (BROVANA) 15 mcg/2 mL nebulizer solution, 15 mcg, Nebulization, 2x/day  [Held by provider] aspirin (ECOTRIN) enteric coated tablet 81 mg, 81 mg, Oral, Daily  [Held by provider] clopidogrel (PLAVIX) 75 mg tablet, 75 mg, Oral, Daily  ipratropium-albuterol 0.5 mg-3 mg(2.5 mg base)/3 mL Solution for Nebulization, 3 mL, Nebulization, 3x/day PRN  nicotine (NICODERM CQ) transdermal patch (mg/24 hr), 14 mg, Transdermal, Daily PRN  nicotine (NICODERM CQ) transdermal patch (mg/24 hr), 21 mg, Transdermal, Daily  NS flush syringe, 10 mL, Intracatheter, Q8HRS  NS flush syringe, 10 mL, Intracatheter, Q1H PRN  OLANZapine (zyPREXA) tablet, 10 mg, Oral, NIGHTLY  pantoprazole (PROTONIX) delayed release tablet, 40 mg, Oral, 2x/day AC  rosuvastatin (CRESTOR) tablet, 10 mg, Oral, QPM  sertraline (ZOLOFT) tablet, 200 mg, Oral, Daily  tamsulosin (FLOMAX) capsule, 0.4 mg, Oral, Daily after Dinner  traZODone (DESYREL) tablet, 50 mg, Oral, NIGHTLY      Allergies   Allergen Reactions    Bee Venom Protein (Honey Bee)      Bee stings     Physical exam:  General Appearance:   Well developed, well nourished. No acute distress.    Skin: Warm and dry without rashes.  HEENT:  Normocephalic, atraumatic.  Sclerae nonicteric, conjunctiva pink,  Extraocular movements intact. Mucous membranes moist.  Neck: Trachea midline.  No thyromegaly visible  Respiratory:  Respirations nonlabored, chest wall expansion symmetrical. 5L NC.  Cardiovascular:  Extremities warm, well perfused, no edema.  Abdomen:  Soft, nontender, nondistended.    Psychiatric: Normal mood and affect.  Neurologic: Awake, alert and oriented x3  Genitourinary:  foley draining urine bloody in color with CBI running.    Urine Dip  LEUKOCYTES   Date Value Ref Range Status   01/05/2024 Negative Negative WBCs/uL Final     NITRITE   Date Value Ref Range Status   01/05/2024 Negative Negative Final     UROBILINOGEN   Date Value Ref Range Status   01/05/2024 3.0 (A) < 2.0, 1.0, 0.2  mg/dL Final     PROTEIN   Date Value Ref Range Status   01/05/2024 100 (A) Negative, 10 , 20  mg/dL Final     PH (ARTERIAL)   Date Value Ref Range Status   05/19/2021 7.43 7.35 - 7.45 Final     BILIRUBIN   Date Value Ref Range Status   01/05/2024 Negative Negative mg/dL Final     GLUCOSE   Date Value Ref Range Status   01/05/2024 Negative 30 , Negative mg/dL Final     CT A/P 96/04/54:    Narrative & Impression   Hector [name]     RADIOLOGIST: Truman Hayward, MD     CT CHEST ABDOMEN PELVIS WO IV CONTRAST performed on 01/05/2024 1:44 PM     CLINICAL HISTORY: sob, abd pain, hematuria.       blood in urine for months, reports dark clots and pain     TECHNIQUE:  Chest, abdomen and pelvis CT without intravenous contrast.      CT ABDOMEN/PELVIS:  Kidneys:   There is mild fullness of the right ureter and mild right hydronephrosis. There appears to be soft tissue density towards the right ureterovesicular junction seen on axial image 230 which is nonspecific. A soft tissue mass is not excluded. Urology follow-up recommended. The left ureter and kidney are  unremarkable.  Bladder:  Unremarkable.  Prostate:  Unremarkable.  IMPRESSION:  MILD FULLNESS OF THE RIGHT URETER AND MILD RIGHT HYDRONEPHROSIS. THERE IS SOFT TISSUE DENSITY NEAR THE RIGHT URETEROVESICULAR JUNCTION WHICH COULD BE SOFT TISSUE MASS VERSUS BLOOD CLOT. UROLOGY FOLLOW-UP IS RECOMMENDED.     MODERATE EMPHYSEMA. NO ACUTE FINDINGS IN THE CHEST.     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 NO ASCITES.         Assessment/Plan:   Right hydronephrosis  Gross hematuria   Dysuria  Abnormal urine cytology  Abnormal CT scan with possible malignancy at the bladder wall vs distal right ureter  Thrombocytopenia  Splenomegaly   Right CVA tenderness, resolved  Tobacco dependency   Former Alcohol dependency    -Referral placed for Urologic Oncology at [facility] for further evaluation of possible malignancy in view of hematuria and abnormal CT. Patient will need diagnostic ureteroscopy and biopsy. Patient has appointment scheduled for 01/28/24.  -CBI to run to ensure urine is clear-pink with no clots. Manually irrigate PRN for clot evacuation. Notify urology if catheter becomes obstructed.   -Consider tamsulosin 0.4 mg as tolerated.  -Would recommend holding anticoagulants if no contraindications from primary team.    I independently of the faculty provider spent a total of 20 minutes in direct care of this patient including initial evaluation, review of laboratory, Radiology, diagnostic studies, review of medical record, order entry and coordination of care.    Orvil Feil, APRN-BC  Advanced Practice Provider  [facility] Medicine [facility]  Department of Urology  Portions of this note may be dictated using voice recognition software or a dictation device.  Variances in spelling and vocabulary are possible and unintentional.  Not all errors are caught and corrected.  Please notify the Thereasa Parkin if any discrepancies are noted or if the meaning of any statement is not clear.

## 2024-01-08 NOTE — Care Management Notes (Signed)
[facility]  Care Management Note    Patient Name: Hector [name]  Date of Birth: 21-Apr-1961  Sex: male  Date/Time of Admission: 01/05/2024  1:19 PM  Room/Bed: 571/A  Payor: HEALTH [employer]AN MEDICAID / Plan: HEALTH PLAN MEDICAID / Product Type: Medicaid [facility] /    LOS: 3 days   Primary Care Providers:  Robin Searing, MD, MD (General)    Admitting Diagnosis:  COPD exacerbation (CMS [facility]) [J44.1]    Assessment:   Discussed patient in discharge planning. Urine remains bloody w/ CBI infusing. For potential cysto once he has been off Plavix long enough. Not anticipated to discharge today.    Discharge Plan:  Home (Patient/Family Member/other) (code 1)  Home plan when ready    The patient will continue to be evaluated for developing discharge needs.     Case Manager: Verdene Lennert, RN  Phone:

## 2024-01-08 NOTE — Progress Notes (Signed)
[facility] Medicine [facility]  Department of Family Medicine  Inpatient Progress Note      Name: Hector [name]  Age: 63 y.o.   Gender: male  MRN: [id number]  Date of Admission:  01/05/2024  PCP: Robin Searing, MD  Attending: Buren Kos, MD  Consultants:   Consult Orders Placed This Encounter   Procedures    IP CONSULT TO UROLOGY On-Call Provider (nurse/clerk to determine)       SUBJECTIVE:      Hospital day 3    Patient seen and evaluated at bedside. No acute events reported. Patient is complaining of abdominal bloating this AM. His last BM was this morning and he feels it was a good bowel movement. He did not look. He is tolerating a regular diet and states he only has pain when clots are coming through the CBI. He is concerned the CBI is "backing everything up" and is interested in going home.     Review of Systems:  All other Ros negative unless stated in HPI    OBJECTIVE     BP 137/63   Pulse 67   Temp 36.8 C (98.2 F)   Resp 19   Ht 1.753 m (5\' 9" )   Wt 111 kg (244 lb 14.9 oz)   SpO2 93%   BMI 36.17 kg/m     PHYSICAL EXAM  General: appears chronically ill, appears older than stated age, moderately obese, and no distress  Eyes: Conjunctiva clear., Pupils equal and round.   HENT:ENMT without erythema or injection, mucous membranes slightly dry.  Neck: supple, symmetrical, trachea midline  Lungs: Breathing nonlabored, Clear to auscultation bilaterally.   Cardiovascular: regular rate and rhythm, S1, S2 normal, no murmur, click, rub or gallop  Abdomen: distended, non-tender, Bowel sounds normal  Genitourinary: CBI running, pink yellow urine  Extremities: extremities normal, atraumatic, no cyanosis or edema  Skin: Skin warm and dry  Psychiatric: Normal affect, behavior, memory, thought content, judgement, and speech.    Medications:    budesonide (PULMICORT RESPULES) 0.5 mg/2 mL nebulizer suspension, 0.5 mg, 2x/day **AND** arformoterol (BROVANA) 15 mcg/2 mL nebulizer solution, 15 mcg, 2x/day     [Held by provider] aspirin (ECOTRIN) enteric coated tablet 81 mg, 81 mg, Daily    [Held by provider] clopidogrel (PLAVIX) 75 mg tablet, 75 mg, Daily    nicotine (NICODERM CQ) transdermal patch (mg/24 hr), 21 mg, Daily    NS flush syringe, 10 mL, Q8HRS    OLANZapine (zyPREXA) tablet, 10 mg, NIGHTLY    pantoprazole (PROTONIX) delayed release tablet, 40 mg, 2x/day AC    rosuvastatin (CRESTOR) tablet, 10 mg, QPM    sertraline (ZOLOFT) tablet, 200 mg, Daily    tamsulosin (FLOMAX) capsule, 0.4 mg, Daily after Dinner    traZODone (DESYREL) tablet, 50 mg, NIGHTLY    Labs:  CBC:     4.5 (01/23 5409) \   12.2* (01/23 8119) /   57* (01/23 1478)      / 36.7 (01/23 2956) \      BMP:   141 (01/23 2130) 113* (01/23 8657) 24* (01/23 8469)    /     146* (01/23 6295)   4.4 (01/23 2841) 24 (01/23 3244) 1.41* (01/23 0102) \         In's and Out's:    Intake/Output Summary (Last 24 hours) at 01/08/2024 0640  Last data filed at 01/08/2024 0500  Gross per 24 hour   Intake 9400 ml   Output 72536 ml  Net -8600 ml       Radiology Results:  CT IVP/UROGRAM (ABDOMEN/PELVIS WO/W IV CONTRAST)    Result Date: 01/06/2024  Impression Persistent ovoid soft tissue fullness at the level of the right ureterovesical junction which measures approximately 1.7 cm on today's study. This is suspicious for bladder wall or distal right ureteral neoplasm. Direct visualization may be of value for further evaluation. There is associated mildly delayed right renal enhancement without evidence of right-sided hydronephrosis or hydroureter. Decompressed ureter bladder with a Foley catheter in place. Other stable nonacute findings as detailed above. One or more dose reduction techniques were used (e.g., Automated exposure control, adjustment of the mA and/or kV according to patient size, use of iterative reconstruction technique). Radiologist location ID: [id number]      Microbiology:  Hospital Encounter on 01/05/24 (from the past 96 hour(s))   URINE CULTURE,ROUTINE     Collection Time: 01/05/24  2:05 PM    Specimen: Urine, Site not specified   Culture Result Status    URINE CULTURE No Growth 2 Days Final       ASSESSMENT     Hector [name] is a 63 y.o. year-old male admitted for  Active Hospital Problems    Diagnosis    Primary Problem: Hematuria    AKI (acute kidney injury) (CMS HCC)       PLAN   Painless hematuria and urinary incontinence/retention of indeterminate etiology, concern for bladder malignancy/mass  Acute kidney injury, suspected due to obstructive uropathy as above  Initial diagnostics  WBC 3.6, Hgb 12.6- stable, likely 2/2 cirrhosis  INR 1.16, PT 13.0  CMP: creatinine 1.30 up from baseline 0.7-0.8, hyponatremia 2/2 dehydration  CK 45  Urinalysis notable for turbid frankly bloody specimen with elevated protein, urobilinogen; RBC TNTC, WBC elevated, moderate bacteria  Urine culture: no growth x 2 days  Chest/abdomen/pelvis CT notable for mild fullness of right ureter and mild right hydronephrosis with soft tissue density near right ureterovesicular junction, possibly soft tissue mass vs clot; hepatic cirrhosis with varices  CT Urogram: persistent ovoid soft tissue fullness measuring 1.7 cm at the level of the UVJ.   Urine cystology revealing atypical urothelial cells.   Inpatient consult placed to Urology / Dr Idamae Lusher  Uroscopy/cystoscopy recommended; Dr. Nolon Bussing will perform next week after Plavix held for 7 days (Day 3/7)  Holding aspirin/clopidogrel per recommendations  Referral placed to Uro/Onc at [facility] for further outpatient evaluation of malignancy, will need diagnostic ureteroscopy and biopsy  Continue tamsulosin 0.4 mg qPM  Foley and CBI running  Urine not at clear pink color for DC today; keep another day per Urology  Bladder scan post void residual  Ceftriaxone 1 g given in ED 1/21; do not suspect infectious etiology so will not continue abx   AM CBC/BMP/Mg     Chronic obstructive pulmonary disease, not in acute exacerbation  On baseline 5L O2  NC  Initial diagnostics  Chest/abdomen/pelvis CT unremarkable of acute chest pathology  EKG notable for sinus rhythm, occasional supraventricular premature complexes, first degree AV block  Home inhalers/nebulizers ordered and adapted to hospital formulary  Albuterol 2.5 mg nebulization q6h PRN  Advair adapted to budesonide/arformoterol nebulization BID  Duo-Neb TID PRN  Nicotine patch 21mg  po daily                  Mild anemia, likely related to alcohol use disorder and liver cirrhosis  Ordered Iron studies  Reveal decreased iron but not at IDA level  b12, folate WNL  AM CBC     First degree AV block  Patient asymptomatic at this time  EKG revealed first degree AV block on admission  Continuous telemetry    Abdominal distention of unknown etiology, suspect habitus  Patient complaining of bloating today  KUB XR ordered     Chronic conditions - continue home medications unless specified otherwise  Thrombocytopenia, 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 alcohol use disorder  EtOH <10; no CIWA protocol necessary at this time  Plts 57, leukopenia stable at 4.5  Tobacco use disorder  Begin nicotine transdermal patch q24h PRN  Anxiety/depression  Trazodone 50 mg nightly  Sertraline 200 mg daily  Olanzapine 10 mg nightly  Gastroesophageal reflux disease  Pantoprazole 40 mg BID    Diet: DIET REGULAR Carb Amount: CARB CONSISTENT 45-60 GM/MEAL  DVT/PE: SCDs due to hematuria, thrombocytopneia   Code Status: FULL CODE: ATTEMPT RESUSCITATION/CPR    Disposition: Home when medically stable    Willeen Cass, DO   01/08/2024, 06:40   Family Medicine Resident, PGY-1  [facility] Medicine [facility]     I saw and examined the patient.  I reviewed the resident's note.  I agree with the findings and plan of care as documented in the resident's note.  Any exceptions/additions are edited/noted.  On rounds his urine was less red than yesterday.  It seems to be improving.  His pain has resolved.  No infectious symptoms.  Aspirin and Plavix on hold inpatient  agreeable to risks.  Urologic oncology eval at [facility] set up.    Buren Kos, MD

## 2024-01-08 NOTE — Care Plan (Signed)
Problem: Adult Inpatient Plan of Care  Goal: Plan of Care Review  Outcome: Ongoing (see interventions/notes)  Goal: Patient-Specific Goal (Individualized)  Outcome: Ongoing (see interventions/notes)  Goal: Absence of Hospital-Acquired Illness or Injury  Outcome: Ongoing (see interventions/notes)  Intervention: Identify and Manage Fall Risk  Recent Flowsheet Documentation  Taken 01/08/2024 0730 by Laural Roes, RN  Safety Promotion/Fall Prevention:   nonskid shoes/slippers when out of bed   safety round/check completed  Intervention: Prevent Skin Injury  Recent Flowsheet Documentation  Taken 01/08/2024 0730 by Laural Roes, RN  Skin Protection: adhesive use limited  Intervention: Prevent and Manage VTE (Venous Thromboembolism) Risk  Recent Flowsheet Documentation  Taken 01/08/2024 0730 by Laural Roes, RN  VTE Prevention/Management: patient refused intervention  Intervention: Prevent Infection  Recent Flowsheet Documentation  Taken 01/08/2024 0730 by Laural Roes, RN  Infection Prevention: environmental surveillance performed  Goal: Optimal Comfort and Wellbeing  Outcome: Ongoing (see interventions/notes)  Intervention: Provide Person-Centered Care  Recent Flowsheet Documentation  Taken 01/08/2024 0730 by Laural Roes, RN  Trust Relationship/Rapport:   care explained   choices provided   thoughts/feelings acknowledged  Goal: Rounds/Family Conference  Outcome: Ongoing (see interventions/notes)     CBI continues-urine cherry to light pink, denies pain. Eliquis continues to be held. willmonitor

## 2024-01-09 LAB — CBC
HCT: 36.7 % (ref 36.0–46.0)
HGB: 12.2 g/dL — ABNORMAL LOW (ref 13.9–16.3)
MCH: 31.2 pg (ref 25.4–34.0)
MCHC: 33.3 g/dL (ref 30.0–37.0)
MCV: 93.6 fL (ref 80.0–100.0)
MPV: 9.3 fL (ref 7.5–11.5)
PLATELETS: 59 10*3/uL — ABNORMAL LOW (ref 130–400)
RBC: 3.92 10*6/uL — ABNORMAL LOW (ref 4.30–5.90)
RDW: 15.5 % — ABNORMAL HIGH (ref 11.5–14.0)
WBC: 3.6 10*3/uL — ABNORMAL LOW (ref 4.5–11.5)

## 2024-01-09 LAB — BASIC METABOLIC PANEL
ANION GAP: 7 mmol/L (ref 5–19)
BUN/CREA RATIO: 15 (ref 6–20)
BUN: 18 mg/dL (ref 9–20)
CALCIUM: 8.4 mg/dL (ref 8.4–10.2)
CHLORIDE: 109 mmol/L — ABNORMAL HIGH (ref 98–107)
CO2 TOTAL: 24 mmol/L (ref 22–30)
CREATININE: 1.24 mg/dL — ABNORMAL HIGH (ref 0.66–1.20)
ESTIMATED GFR: >60 mL/min/{1.73_m2} (ref >60)
GLUCOSE: 92 mg/dL (ref 74–106)
POTASSIUM: 4.4 mmol/L (ref 3.5–5.1)
SODIUM: 140 mmol/L (ref 137–145)

## 2024-01-09 NOTE — Care Plan (Signed)
Problem: Adult Inpatient Plan of Care  Goal: Plan of Care Review  Outcome: Ongoing (see interventions/notes)  Goal: Patient-Specific Goal (Individualized)  Outcome: Ongoing (see interventions/notes)  Goal: Absence of Hospital-Acquired Illness or Injury  Outcome: Ongoing (see interventions/notes)  Intervention: Prevent and Manage VTE (Venous Thromboembolism) Risk  Recent Flowsheet Documentation  Taken 01/08/2024 2000 by Dorothy Puffer, RN  VTE Prevention/Management:   sequential compression devices off   patient refused intervention  Goal: Optimal Comfort and Wellbeing  Outcome: Ongoing (see interventions/notes)  Goal: Rounds/Family Conference  Outcome: Ongoing (see interventions/notes)     Problem: Urinary Elimination Management  Goal: Effective Urinary Elimination/Continence  Outcome: Ongoing (see interventions/notes)   POC reviewed and continued  Leonel Ramsay, RN

## 2024-01-09 NOTE — Care Plan (Signed)
Problem: Adult Inpatient Plan of Care  Goal: Plan of Care Review  Outcome: Ongoing (see interventions/notes)  Goal: Patient-Specific Goal (Individualized)  Outcome: Ongoing (see interventions/notes)  Goal: Absence of Hospital-Acquired Illness or Injury  Outcome: Ongoing (see interventions/notes)  Intervention: Prevent and Manage VTE (Venous Thromboembolism) Risk  Recent Flowsheet Documentation  Taken 01/09/2024 0833 by Zenaida Niece, RN  VTE Prevention/Management: patient refused intervention  Goal: Optimal Comfort and Wellbeing  Outcome: Ongoing (see interventions/notes)  Goal: Rounds/Family Conference  Outcome: Ongoing (see interventions/notes)     Problem: Urinary Elimination Management  Goal: Effective Urinary Elimination/Continence  Outcome: Ongoing (see interventions/notes)

## 2024-01-09 NOTE — Discharge Summary (Incomplete)
[facility]  DISCHARGE SUMMARY    PATIENT NAME:  Hector [name], Hector [name]  MRN:  [id number]  DOB:  05-05-1961    ENCOUNTER DATE:  01/05/2024  INPATIENT ADMISSION DATE: 01/05/2024  DISCHARGE DATE:  01/09/2024    ATTENDING PHYSICIAN: Buren Kos, MD  SERVICE: [facility] FAMILY [facility]IC[facility] 2  PRIMARY CARE PHYSICIAN: Robin Searing, MD       No lay caregiver identified.    PRIMARY DISCHARGE DIAGNOSIS: Hematuria  Active Hospital Problems    Diagnosis Date Noted    Principal Problem: Hematuria [R31.9] 10/20/2023    AKI (acute kidney injury) (CMS [facility]) [N17.9] 01/06/2024      Resolved Hospital Problems   No resolved problems to display.     Active Non-Hospital Problems    Diagnosis Date Noted    COPD exacerbation (CMS HCC) 01/05/2024    Urinary frequency 10/20/2023    Carotid stenosis, asymptomatic, right 06/29/2023    Carotid stenosis, right 06/03/2023    Coronary artery calcification seen on CT scan 06/03/2023    Mental disorder 09/25/2021    Dyslipidemia 09/25/2021    Annual physical exam 09/25/2021    Chronic obstructive pulmonary disease (CMS HCC) 06/18/2021    Tobacco use disorder 05/29/2021    Cirrhosis of liver without ascites (CMS HCC) 05/01/2021    Alcohol abuse 05/01/2021             Current Discharge Medication List        CONTINUE these medications - NO CHANGES were made during your visit.        Details   albuterol sulfate 90 mcg/actuation oral inhaler  Commonly known as: Ventolin HFA   2 Puffs, Inhalation, EVERY 6 HOURS PRN  Qty: 1 Each  Refills: 5     aspirin 81 mg Tablet, Delayed Release (E.C.)  Commonly known as: ECOTRIN   81 mg, Oral, DAILY  Refills: 0     clopidogreL 75 mg Tablet  Commonly known as: PLAVIX   75 mg, Oral, DAILY  Qty: 90 Tablet  Refills: 3     fluticasone propion-salmeteroL 230-21 mcg/actuation oral inhaler  Commonly known as: Advair HFA   2 Puffs, Inhalation, 2 TIMES DAILY, RINSE MOUTH WITH WATER AFTER INHALER USE  Qty: 1 g  Refills: 5     ipratropium-albuteroL 0.5 mg-3 mg(2.5 mg base)/3 mL nebulizer  solution  Commonly known as: DUONEB   3 mL, Nebulization, 3 TIMES DAILY PRN  Qty: 270 mL  Refills: 1     OLANZapine 10 mg Tablet  Commonly known as: zyPREXA   10 mg, Oral, NIGHTLY  Refills: 0     oxygen gas  Commonly known as: o2   4 L/min, Nasal, CONTINUOUS  Refills: 0     pantoprazole 40 mg Tablet, Delayed Release (E.C.)  Commonly known as: PROTONIX   TAKE 1 TABLET (40 MG TOTAL) BY MOUTH TWICE A DAY 30 MINTUTES BEFORE MEALS FOR 90 DAYS  Qty: 60 Tablet  Refills: 3     sertraline 100 mg Tablet  Commonly known as: ZOLOFT   200 mg, Oral, DAILY  Qty: 30 Tablet  Refills: 0     tamsulosin 0.4 mg Capsule  Commonly known as: FLOMAX   0.4 mg, Oral, EVERY EVENING AFTER DINNER  Qty: 30 Capsule  Refills: 2     traZODone 50 mg Tablet  Commonly known as: DESYREL   50 mg, Oral, NIGHTLY, Unsure dosage  Refills: 0            ASK your  doctor about these medications.        Details   rosuvastatin 10 mg Tablet  Commonly known as: CRESTOR  Ask about: Which instructions should I use?   10 mg, Oral, EVERY EVENING  Qty: 90 Tablet  Refills: 1            Discharge med list refreshed?  YES     Allergies   Allergen Reactions    Bee Venom Protein (Honey Bee)      Bee stings     HOSPITAL PROCEDURE(S):   No orders of the defined types were placed in this encounter.      REASON FOR HOSPITALIZATION AND HOSPITAL COURSE   BRIEF HPI:  This is a 63 y.o., male with a past medical history of hyperlipidemia, hypertension, depression, alcohol use disorder now sober for three years, COPD with 5L O2 at baseline, and cirrhosis of the liver admitted for hematuria. Patient has a noted history of urinary frequency as well which was managed with outpatient tamsulosin.     Reportedly, patient has experience two months of persistently bloody urine as well as periodic waxing/waning abdominal pain which is not present at the time of evaluation. Patient cited that he often experiences incomplete voiding and has a sensation of hesitation that accompanies urination,  which seems to happen very suddenly nowadays. Patient had been taking tamsulosin at home as documented above in addition to other medications; however, patient noted that it was time to be seen as his condition over the last two months has now happened long enough. In addition to these complaints, patient is on 5L oxygen by nasal cannula at baseline while at home due to COPD secondary to long-term tobacco use, and as part of this patient noted that he experiences occasional shortness of breath as well as headache; however, patient is not experiencing shortness of breath at this time. Patient could not identify a chronicity or a pattern of headaches or shortness of breath. Furthermore, patient noted that he would often remove oxygen and be active in his home without it, likely lending to his shortness of breath.     Patient denied experiencing constitutional complaints of fever, chills, nausea, disorientation, weakness, fatigue, constipation, or diarrhea.       BRIEF HOSPITAL NARRATIVE:     On admission, patient was noted to have AKI 2/2 obstructive nephropathy from blood clots 2/2 suspected malignancy. Imaging (CT chest abdomen pelvis and CT IVP/urogram) reveal soft tissue mass near the right uterovesical junction without hydronephrosis or calculus. Urology (Dr. Nolon Bussing) was consulted and a foley was placed. Patient underwent CBI for four days. His aspirin and Plavix were held during that time to see if hematuria resolved enough for uteroscopy/cystoscopy to be performed. ______________     Patient was urgently referred to Urology Oncology at [facility] in [place] and has an outpatient appt 02/01/24. Patient and SO [name] were instructed at great length about importance of follow up.        TRANSITION/POST DISCHARGE CARE/PENDING TESTS/REFERRALS:   Ensure patient follows up with urology oncology in [facility]    CONDITION ON DISCHARGE:  A. Ambulation: Full ambulation  B. Self-care Ability: Complete  C.  Cognitive Status Alert and Oriented x 3  D. Code status at discharge: Full Code      LINES/DRAINS/WOUNDS AT DISCHARGE:   Patient Lines/Drains/Airways Status       Active Line / Dialysis Catheter / Dialysis Graft / Drain / Airway / Wound  Name Placement date Placement time Site Days    Peripheral IV Right Median Cubital  (antecubital fossa) 01/05/24  1320  -- 3    Foley Catheter 01/06/24  1106  -- 2    Wound  Incision Right Neck 06/29/23  1102  -- 193    Wound  Other (comment) Left Groin 06/29/23  1158  -- 193                    DISCHARGE DISPOSITION:  Home discharge  DISCHARGE INSTRUCTIONS:  Post-Discharge Follow Up Appointments       Schedule an appointment with Robin Searing, MD as soon as possible for a visit in 1 week    Phone: [phone]    Where: [facility]      Thursday Jan 14, 2024    Return Patient Visit with Consuelo Pandy, PA-C at  1:00 PM      Tuesday Jan 26, 2024    Return Patient Visit with Robin Searing, MD at  9:15 AM      Thursday Jan 28, 2024    Testing with [place] [facility] at 11:00 AM      Monday Feb 01, 2024    New Patient Visit with Isaias Sakai, APRN,AGPCNP-BC at 11:30 AM      Wednesday Feb 03, 2024    Return Patient Visit with Jamesetta Geralds, MD at 10:00 AM      Tuesday Apr 26, 2024    Return Patient Visit with Biscan-Banks, Doroteo Glassman, CFNP at  1:00 PM      Gastroenterology, [facility] 3  [facility] 3, [place]  [address] [place] [address]  [phone] Imaging Services, [facility] 1  [facility] 1, [facility]  [address]  [place] [address]  [phone] Internal Medicine, [facility], [place]  [address]  [place] [address]  [phone]    Pulmonology, Satira Sark [facility], [address] [place]  [address]  [place] [address]  [phone] Urology Oncology, Windy Kalata [facility]  [facility], [facility]  1 [facility]  [place] [address]  [phone] Vascular Surgery, [facility] 1  [facility] 1, [facility]  [address]  [place] [address]  [phone]             Refer to [facility] Urology [facility]   Referral Priority: Urgent Referral Type: Physician Referral-Office Visits   Number of Visits Requested: 1        Willeen Cass, DO  [facility]  Family Medicine Resident, PGY-1    Copies sent to Care Team         Relationship Specialty Notifications Start End    Robin Searing, MD PCP - General INTERNAL MEDICINE Admissions 07/22/21     Phone: [phone] Fax: [phone]         [address] [place] [address]    Leim Fabry  NURSE PRACTITIONER Admissions 06/22/23     Phone: [phone] Fax: [phone]         [phone] [place] [facility] [address]            Referring providers can utilize [email or web address] to access their referred [facility] Medicine patient's information.

## 2024-01-09 NOTE — Consults (Signed)
[facility]       Kole, Hilyard, 63 y.o. male  Date of Admission:  01/05/2024  Date of service: 01/09/2024  Date of Birth:  1961-05-06    Consulting Physician: Orlene Och, MD     This 63 year old presented with hematuria.  He has had for number of months.  There is a history of cigarette use, and cirrhosis.  CT urogram showed a 1.7 cm fullness at the right UVJ suspicious for malignancy.  Urine is clear to light pink in the Foley tube    Patient has a history of alcoholism with cirrhosis, chronic obstructive pulmonary disease, thrombocytopenia, and elevated cholesterol.    Afebrile  White count 3.6   Hemoglobin 12.2   Platelets 59000   Creatinine 1.2    Impressions  Gross hematuria with CT year g suspicious for malignancy at the right UVJ  History of tobacco use  History of alcoholism with cirrhosis  Thrombocytopenia with [place] [address]  Chronic obstructive pulmonary disease    Recommendations  Discharge patient with catheter  He has an appointment to be seen at [facility].  He is aware of the abnormal CT findings and the suspicion of malignancy  He is to come emergency room if bleeding worsens or he develops clots  Hold anticoagulants unless essential medically due to the high risk of bleeding          Orlene Och, MD

## 2024-01-09 NOTE — Discharge Summary (Signed)
[facility]  DISCHARGE SUMMARY    PATIENT NAME:  Hector [name], Hector [name]  MRN:  [id number]  DOB:  1961/09/25    ENCOUNTER DATE:  01/05/2024  INPATIENT ADMISSION DATE: 01/05/2024  DISCHARGE DATE:  01/09/2024    ATTENDING PHYSICIAN: Buren Kos, MD  SERVICE: [facility] FAMILY MEDICINE 2  PRIMARY CARE PHYSICIAN: Robin Searing, MD     No lay caregiver identified.    PRIMARY DISCHARGE DIAGNOSIS: Hematuria  Active Hospital Problems    Diagnosis Date Noted    Principal Problem: Hematuria [R31.9] 10/20/2023    AKI (acute kidney injury) (CMS [facility]) [N17.9] 01/06/2024      Resolved Hospital Problems   No resolved problems to display.     Active Non-Hospital Problems    Diagnosis Date Noted    COPD exacerbation (CMS HCC) 01/05/2024    Urinary frequency 10/20/2023    Carotid stenosis, asymptomatic, right 06/29/2023    Carotid stenosis, right 06/03/2023    Coronary artery calcification seen on CT scan 06/03/2023    Mental disorder 09/25/2021    Dyslipidemia 09/25/2021    Annual physical exam 09/25/2021    Chronic obstructive pulmonary disease (CMS HCC) 06/18/2021    Tobacco use disorder 05/29/2021    Cirrhosis of liver without ascites (CMS HCC) 05/01/2021    Alcohol abuse 05/01/2021        Current Discharge Medication List        CONTINUE these medications - NO CHANGES were made during your visit.        Details   albuterol sulfate 90 mcg/actuation oral inhaler  Commonly known as: Ventolin HFA   2 Puffs, Inhalation, EVERY 6 HOURS PRN  Qty: 1 Each  Refills: 5     aspirin 81 mg Tablet, Delayed Release (E.C.)  Commonly known as: ECOTRIN   81 mg, Oral, DAILY  Refills: 0     clopidogreL 75 mg Tablet  Commonly known as: PLAVIX   75 mg, Oral, DAILY  Qty: 90 Tablet  Refills: 3     fluticasone propion-salmeteroL 230-21 mcg/actuation oral inhaler  Commonly known as: Advair HFA   2 Puffs, Inhalation, 2 TIMES DAILY, RINSE MOUTH WITH WATER AFTER INHALER USE  Qty: 1 g  Refills: 5     ipratropium-albuteroL 0.5 mg-3 mg(2.5 mg base)/3 mL nebulizer  solution  Commonly known as: DUONEB   3 mL, Nebulization, 3 TIMES DAILY PRN  Qty: 270 mL  Refills: 1     OLANZapine 10 mg Tablet  Commonly known as: zyPREXA   10 mg, Oral, NIGHTLY  Refills: 0     oxygen gas  Commonly known as: o2   4 L/min, Nasal, CONTINUOUS  Refills: 0     pantoprazole 40 mg Tablet, Delayed Release (E.C.)  Commonly known as: PROTONIX   TAKE 1 TABLET (40 MG TOTAL) BY MOUTH TWICE A DAY 30 MINTUTES BEFORE MEALS FOR 90 DAYS  Qty: 60 Tablet  Refills: 3     rosuvastatin 10 mg Tablet  Commonly known as: CRESTOR   10 mg, Oral, EVERY EVENING  Qty: 90 Tablet  Refills: 1     tamsulosin 0.4 mg Capsule  Commonly known as: FLOMAX   0.4 mg, Oral, EVERY EVENING AFTER DINNER  Qty: 30 Capsule  Refills: 2     traZODone 50 mg Tablet  Commonly known as: DESYREL   50 mg, Oral, NIGHTLY, Unsure dosage  Refills: 0            STOP taking these medications.  sertraline 100 mg Tablet  Commonly known as: ZOLOFT            Discharge med list refreshed?  YES     Allergies   Allergen Reactions    Bee Venom Protein (Honey Bee)      Bee stings     HOSPITAL PROCEDURE(S):   No orders of the defined types were placed in this encounter.    REASON FOR HOSPITALIZATION AND HOSPITAL COURSE   BRIEF HPI:  AS per admitting provider, "Hector [name] is a 63 y.o., male with a past medical history of hyperlipidemia, hypertension, depression, alcohol use disorder now sober for three years, COPD with 5L O2 at baseline, and cirrhosis of the liver admitted for hematuria. Patient has a noted history of urinary frequency as well which was managed with outpatient tamsulosin.  Reportedly, patient has experience two months of persistently bloody urine as well as periodic waxing/waning abdominal pain which is not present at the time of evaluation. Patient cited that he often experiences incomplete voiding and has a sensation of hesitation that accompanies urination, which seems to happen very suddenly nowadays. Patient had been taking tamsulosin at  home as documented above in addition to other medications; however, patient noted that it was time to be seen as his condition over the last two months has now happened long enough. In addition to these complaints, patient is on 5L oxygen by nasal cannula at baseline while at home due to COPD secondary to long-term tobacco use, and as part of this patient noted that he experiences occasional shortness of breath as well as headache; however, patient is not experiencing shortness of breath at this time. Patient could not identify a chronicity or a pattern of headaches or shortness of breath. Furthermore, patient noted that he would often remove oxygen and be active in his home without it, likely lending to his shortness of breath. Patient denied experiencing constitutional complaints of fever, chills, nausea, disorientation, weakness, fatigue, constipation, or diarrhea."     BRIEF HOSPITAL NARRATIVE:    Patient was admitted 01/05/24 for AKI likely secondary to obstructive nephropathy from blood clots due to suspected malignancy. CT chest abdomen pelvis and CT IVP/urogram revealed soft tissue mass near the right uterovesical junction without hydronephrosis or calculus. Urology (Dr. Nolon Bussing) was consulted and a foley was placed with CBI that ran for five days. His aspirin and Plavix were held during that time to see if hematuria resolved enough for a diagnostic uteroscopy/cystoscopy. Urgent referral placed to Urology Oncology at [facility] in [place] and has an outpatient appt scheduled on 02/01/24. A multidisciplinary decision was made to send the patient home with a Foley and to follow-up with Uro-oncology as scheduled. Patient was advised on when to go back to the ED.  Patient and SO Hector [name] were instructed at great length about importance of follow up.  Patient remained hemodynamically stable and was discharged 01/09/24 home.     TRANSITION/POST DISCHARGE CARE/PENDING TESTS/REFERRALS:   Ensure patient  follows up with Urology-Oncology in [facility] 2/17  HOLD Aspirin and Plavix until seen by Uro-Oncology    CONDITION ON DISCHARGE:  A. Ambulation: Full ambulation  B. Self-care Ability: Complete  C. Cognitive Status Alert and Oriented x 3  D. Code status at discharge: Full Code    Physical exam on the day of discharge:   General: appears chronically ill, appears older than stated age, moderately obese, and no distress  Eyes: Conjunctiva clear., Pupils equal and round.  HENT:ENMT without erythema or injection, mucous membranes slightly dry.  Neck: supple, symmetrical, trachea midline  Lungs: Breathing nonlabored, Clear to auscultation bilaterally. On 5L O2 (at baseline)  Cardiovascular: regular rate and rhythm, S1, S2 normal, no murmur, click, rub or gallop  Abdomen: Soft, non-distended, non-tender, Bowel sounds normal.  Genitourinary: CBI running, clear, peach-colored  Extremities: extremities normal, atraumatic, no cyanosis or edema  Skin: Skin warm and dry  Psychiatric: Normal affect, behavior, memory, thought content, judgement, and speech.    LINES/DRAINS/WOUNDS AT DISCHARGE:   Patient Lines/Drains/Airways Status       Active Line / Dialysis Catheter / Dialysis Graft / Drain / Airway / Wound       Name Placement date Placement time Site Days    Peripheral IV Right Median Cubital  (antecubital fossa) 01/05/24  1320  -- 3    Foley Catheter 01/06/24  1106  -- 2    Wound  Incision Right Neck 06/29/23  1102  -- 194    Wound  Other (comment) Left Groin 06/29/23  1158  -- 194                  DISCHARGE DISPOSITION:  Home discharge  DISCHARGE INSTRUCTIONS:  Post-Discharge Follow Up Appointments       Schedule an appointment with Robin Searing, MD as soon as possible for a visit in 1 week    Phone: [phone]    Where: [facility]      Thursday Jan 14, 2024    Return Patient Visit with Consuelo Pandy, PA-C at  1:00 PM      Tuesday Jan 26, 2024    Return Patient Visit with Robin Searing, MD at  9:15 AM       Thursday Jan 28, 2024    Testing with [place] [facility] at 11:00 AM      Monday Feb 01, 2024    New Patient Visit with Isaias Sakai, APRN,AGPCNP-BC at 11:30 AM      Wednesday Feb 03, 2024    Return Patient Visit with Jamesetta Geralds, MD at 10:00 AM      Tuesday Apr 26, 2024    Return Patient Visit with Biscan-Banks, Doroteo Glassman, CFNP at  1:00 PM      Gastroenterology, [facility] 3  [facility] 3, [place]  [address] [place] [address]  [phone] Imaging Services, [facility] 1  [facility] 1, [facility]  [address]  [place] [address]  [phone] Internal Medicine, [facility], [place]  [address]  [place] [address]  [phone]    Pulmonology, Satira Sark [facility], [address] [place]  [address]  [place] [address]  [phone] Urology Oncology, Windy Kalata [facility]  [facility], [facility]  1 [facility]  [place] [address]  [phone] Vascular Surgery, [facility] 1  [facility] 1, [facility]  [address]  [place] [address]  [phone]             Refer to [facility] Urology [facility]   Referral Priority: Urgent Referral Type: Physician Referral-Office Visits   Number of Visits Requested: 1     DISCHARGE INSTRUCTION - RESUME HOME DIET     Diet: RESUME HOME DIET      DISCHARGE INSTRUCTION - MISC    - Keep foley in place until you follow  up for appt in [place] on 02/17 with Urology-Oncology  - HOLD taking Aspirin and Plavix until you are seen by Uro-oncology  - Follow up with your PCP in one week     INTERNAL MEDICINE - [facility], [address] [place]) - [place], [place]     Reason for visit: HOSPITAL DISCHARGE    Post Discharge Destination: Home    Diagnosis Hematuria [1610960]    Follow-up in: 1 WEEK         Giannis Corpuz Ollen Barges, MD  Family Medicine Resident,  PGY-2   [facility] Medicine [facility]    Copies sent to Care Team         Relationship Specialty Notifications Start End    Robin Searing, MD PCP - General INTERNAL MEDICINE Admissions 07/22/21     Phone: [phone] Fax: [phone]         [address]  [place] [address]    Leim Fabry  NURSE PRACTITIONER Admissions 06/22/23     Phone: [phone] Fax: [phone]         [phone] [place] [facility] [address]          Referring providers can utilize [email or web address] to access their referred [facility] Medicine patient's information.            I saw and examined the patient.  I reviewed the resident's note.  I agree with the findings and plan of care as documented in the resident's note.  Any exceptions/additions are edited/noted.  His aspirin and Plavix will remain on hold until further notice.  He has a Foley in place.  He is aware if he develops any suprapubic plane or clots or decreased Foley output that he needs to return to the ER.  He has urology follow-up already scheduled.  Buren Kos, MD

## 2024-01-11 ENCOUNTER — Telehealth (INDEPENDENT_AMBULATORY_CARE_PROVIDER_SITE_OTHER): Payer: Self-pay | Admitting: Internal Medicine

## 2024-01-11 LAB — VITAMIN B1 (THIAMIN), WHOLE BLOOD: VITAMIN B1 (THIAMINE), BLOOD, LC/MS/MS: 123 nmol/L (ref 78–185)

## 2024-01-12 ENCOUNTER — Other Ambulatory Visit: Payer: 59

## 2024-01-13 ENCOUNTER — Other Ambulatory Visit (INDEPENDENT_AMBULATORY_CARE_PROVIDER_SITE_OTHER): Payer: Self-pay | Admitting: Internal Medicine

## 2024-01-13 DIAGNOSIS — R350 Frequency of micturition: Secondary | ICD-10-CM

## 2024-01-14 ENCOUNTER — Ambulatory Visit (INDEPENDENT_AMBULATORY_CARE_PROVIDER_SITE_OTHER): Payer: Self-pay | Admitting: Medical

## 2024-01-15 ENCOUNTER — Encounter: Payer: Self-pay | Admitting: Family Medicine

## 2024-01-15 ENCOUNTER — Other Ambulatory Visit (INDEPENDENT_AMBULATORY_CARE_PROVIDER_SITE_OTHER): Payer: 59

## 2024-01-15 ENCOUNTER — Inpatient Hospital Stay
Admission: EM | Admit: 2024-01-15 | Discharge: 2024-01-20 | DRG: 699 | Disposition: A | Discharge status: Other Acute Inpatient Hospital | Payer: MEDICAID | Attending: FAMILY PRACTICE | Admitting: FAMILY PRACTICE

## 2024-01-15 ENCOUNTER — Inpatient Hospital Stay (HOSPITAL_COMMUNITY): Payer: MEDICAID | Admitting: FAMILY PRACTICE

## 2024-01-15 ENCOUNTER — Encounter (HOSPITAL_COMMUNITY): Payer: Self-pay

## 2024-01-15 ENCOUNTER — Emergency Department (HOSPITAL_COMMUNITY): Payer: MEDICAID

## 2024-01-15 ENCOUNTER — Other Ambulatory Visit: Payer: Self-pay

## 2024-01-15 DIAGNOSIS — F32A Depression, unspecified: Secondary | ICD-10-CM | POA: Diagnosis present

## 2024-01-15 DIAGNOSIS — I10 Essential (primary) hypertension: Secondary | ICD-10-CM | POA: Diagnosis present

## 2024-01-15 DIAGNOSIS — N3001 Acute cystitis with hematuria: Secondary | ICD-10-CM | POA: Diagnosis present

## 2024-01-15 DIAGNOSIS — K7030 Alcoholic cirrhosis of liver without ascites: Secondary | ICD-10-CM | POA: Diagnosis present

## 2024-01-15 DIAGNOSIS — R319 Hematuria, unspecified: Secondary | ICD-10-CM | POA: Diagnosis present

## 2024-01-15 DIAGNOSIS — C679 Malignant neoplasm of bladder, unspecified: Secondary | ICD-10-CM | POA: Diagnosis present

## 2024-01-15 DIAGNOSIS — D696 Thrombocytopenia, unspecified: Secondary | ICD-10-CM | POA: Diagnosis present

## 2024-01-15 DIAGNOSIS — R627 Adult failure to thrive: Principal | ICD-10-CM | POA: Diagnosis present

## 2024-01-15 DIAGNOSIS — E7800 Pure hypercholesterolemia, unspecified: Secondary | ICD-10-CM | POA: Diagnosis present

## 2024-01-15 DIAGNOSIS — Z7951 Long term (current) use of inhaled steroids: Secondary | ICD-10-CM

## 2024-01-15 DIAGNOSIS — F17210 Nicotine dependence, cigarettes, uncomplicated: Secondary | ICD-10-CM | POA: Diagnosis present

## 2024-01-15 DIAGNOSIS — N179 Acute kidney failure, unspecified: Secondary | ICD-10-CM

## 2024-01-15 DIAGNOSIS — D649 Anemia, unspecified: Secondary | ICD-10-CM | POA: Diagnosis present

## 2024-01-15 DIAGNOSIS — I2510 Atherosclerotic heart disease of native coronary artery without angina pectoris: Secondary | ICD-10-CM | POA: Diagnosis present

## 2024-01-15 DIAGNOSIS — Z7982 Long term (current) use of aspirin: Secondary | ICD-10-CM

## 2024-01-15 DIAGNOSIS — F419 Anxiety disorder, unspecified: Secondary | ICD-10-CM | POA: Diagnosis present

## 2024-01-15 DIAGNOSIS — N390 Urinary tract infection, site not specified: Secondary | ICD-10-CM

## 2024-01-15 DIAGNOSIS — Y846 Urinary catheterization as the cause of abnormal reaction of the patient, or of later complication, without mention of misadventure at the time of the procedure: Secondary | ICD-10-CM | POA: Diagnosis present

## 2024-01-15 DIAGNOSIS — T83511A Infection and inflammatory reaction due to indwelling urethral catheter, initial encounter: Principal | ICD-10-CM | POA: Diagnosis present

## 2024-01-15 DIAGNOSIS — N3000 Acute cystitis without hematuria: Secondary | ICD-10-CM

## 2024-01-15 DIAGNOSIS — Z789 Other specified health status: Secondary | ICD-10-CM | POA: Insufficient documentation

## 2024-01-15 DIAGNOSIS — D494 Neoplasm of unspecified behavior of bladder: Secondary | ICD-10-CM

## 2024-01-15 DIAGNOSIS — J449 Chronic obstructive pulmonary disease, unspecified: Secondary | ICD-10-CM | POA: Diagnosis present

## 2024-01-15 DIAGNOSIS — N3289 Other specified disorders of bladder: Secondary | ICD-10-CM | POA: Diagnosis present

## 2024-01-15 DIAGNOSIS — Z79899 Other long term (current) drug therapy: Secondary | ICD-10-CM

## 2024-01-15 DIAGNOSIS — K219 Gastro-esophageal reflux disease without esophagitis: Secondary | ICD-10-CM | POA: Diagnosis present

## 2024-01-15 DIAGNOSIS — J9611 Chronic respiratory failure with hypoxia: Secondary | ICD-10-CM | POA: Diagnosis present

## 2024-01-15 DIAGNOSIS — R748 Abnormal levels of other serum enzymes: Secondary | ICD-10-CM

## 2024-01-15 LAB — COMPREHENSIVE METABOLIC PANEL, NON-FASTING
ALBUMIN/GLOBULIN RATIO: 1.1 — ABNORMAL LOW (ref 1.5–2.5)
ALBUMIN: 3.2 g/dL — ABNORMAL LOW (ref 3.5–5.0)
ALKALINE PHOSPHATASE: 92 U/L (ref 38–126)
ALT (SGPT): 33 U/L (ref <50)
ANION GAP: 7 mmol/L (ref 5–19)
AST (SGOT): 47 U/L (ref 17–59)
BILIRUBIN TOTAL: 0.9 mg/dL (ref 0.2–1.3)
BUN/CREA RATIO: 21 — ABNORMAL HIGH (ref 6–20)
BUN: 19 mg/dL (ref 9–20)
CALCIUM: 8.2 mg/dL — ABNORMAL LOW (ref 8.4–10.2)
CHLORIDE: 109 mmol/L — ABNORMAL HIGH (ref 98–107)
CO2 TOTAL: 23 mmol/L (ref 22–30)
CREATININE: 0.92 mg/dL (ref 0.66–1.20)
ESTIMATED GFR: >60 mL/min/{1.73_m2} (ref >60)
GLUCOSE: 91 mg/dL (ref 74–106)
POTASSIUM: 3.6 mmol/L (ref 3.5–5.1)
PROTEIN TOTAL: 6.1 g/dL — ABNORMAL LOW (ref 6.3–8.2)
SODIUM: 139 mmol/L (ref 137–145)

## 2024-01-15 LAB — URINALYSIS, MACRO/MICRO
BILIRUBIN: NEGATIVE mg/dL
GLUCOSE: NEGATIVE mg/dL
NITRITE: NEGATIVE
PH: 6 (ref 5.0–9.0)
PROTEIN: 300 mg/dL — AB
SPECIFIC GRAVITY: 1.026 (ref 1.003–1.035)
UROBILINOGEN: <2 mg/dL (ref <2.0)

## 2024-01-15 LAB — CBC WITH DIFF
BASOPHIL #: 0 10*3/uL (ref 0.00–0.20)
BASOPHIL %: 1 % (ref 0–2)
EOSINOPHIL #: 0.1 10*3/uL (ref 0.00–0.60)
EOSINOPHIL %: 3 % (ref 0–5)
HCT: 37.7 % (ref 36.0–46.0)
HGB: 12.8 g/dL — ABNORMAL LOW (ref 13.9–16.3)
LYMPHOCYTE #: 1 10*3/uL — ABNORMAL LOW (ref 1.10–3.80)
LYMPHOCYTE %: 27 % (ref 19–46)
MCH: 31.7 pg (ref 25.4–34.0)
MCHC: 34.1 g/dL (ref 30.0–37.0)
MCV: 93.1 fL (ref 80.0–100.0)
MONOCYTE #: 0.5 10*3/uL (ref 0.10–0.80)
MONOCYTE %: 13 % — ABNORMAL HIGH (ref 4–12)
MPV: 8.3 fL (ref 7.5–11.5)
NEUTROPHIL #: 2 10*3/uL (ref 1.80–7.50)
NEUTROPHIL %: 56 % (ref 41–69)
PLATELETS: 67 10*3/uL — ABNORMAL LOW (ref 130–400)
RBC: 4.05 10*6/uL — ABNORMAL LOW (ref 4.30–5.90)
RDW: 15.5 % — ABNORMAL HIGH (ref 11.5–14.0)
WBC: 3.5 10*3/uL — ABNORMAL LOW (ref 4.5–11.5)

## 2024-01-15 LAB — SCAN DIFFERENTIAL
PLATELET ESTIMATE: DECREASED
WBC MORPHOLOGY COMMENT: NORMAL

## 2024-01-15 LAB — AMMONIA: AMMONIA: 29 umol/L (ref 9–30)

## 2024-01-15 LAB — LIPASE
LIPASE: 118 U/L (ref 23–300)
Lipase: 38 U/L (ref 11.0–59.0)

## 2024-01-15 LAB — LACTIC ACID LEVEL W/ REFLEX FOR LEVEL >2.0: LACTIC ACID: 0.6 mmol/L — ABNORMAL LOW (ref 0.7–2.0)

## 2024-01-15 LAB — MAGNESIUM: MAGNESIUM: 1.8 mg/dL (ref 1.6–2.3)

## 2024-01-15 LAB — MICRO HOLD

## 2024-01-15 MED ORDER — PANTOPRAZOLE 40 MG TABLET,DELAYED RELEASE
40.0000 mg | DELAYED_RELEASE_TABLET | Freq: Every day | ORAL | Status: DC
Start: 2024-01-16 — End: 2024-01-20
  Administered 2024-01-16 – 2024-01-20 (×5): 40 mg via ORAL
  Filled 2024-01-15 (×5): qty 1

## 2024-01-15 MED ORDER — CLOPIDOGREL 75 MG TABLET
75.0000 mg | ORAL_TABLET | Freq: Every day | ORAL | Status: DC
Start: 2024-01-15 — End: 2024-01-20

## 2024-01-15 MED ORDER — ASPIRIN 81 MG TABLET,DELAYED RELEASE
81.0000 mg | DELAYED_RELEASE_TABLET | Freq: Every day | ORAL | Status: DC
Start: 2024-01-15 — End: 2024-01-20

## 2024-01-15 MED ORDER — TAMSULOSIN 0.4 MG CAPSULE
0.4000 mg | ORAL_CAPSULE | Freq: Every evening | ORAL | Status: DC
Start: 2024-01-15 — End: 2024-01-20
  Administered 2024-01-15: 0.4 mg via ORAL
  Filled 2024-01-15: qty 1

## 2024-01-15 MED ORDER — SODIUM CHLORIDE 0.9% FLUSH BAG - 250 ML
INTRAVENOUS | Status: DC | PRN
Start: 2024-01-15 — End: 2024-01-20

## 2024-01-15 MED ORDER — TRAZODONE 50 MG TABLET
50.0000 mg | ORAL_TABLET | Freq: Every evening | ORAL | Status: DC
Start: 2024-01-15 — End: 2024-01-20
  Administered 2024-01-15 – 2024-01-19 (×5): 50 mg via ORAL
  Filled 2024-01-15 (×5): qty 1

## 2024-01-15 MED ORDER — SODIUM CHLORIDE 0.9 % INTRAVENOUS PIGGYBACK
1.0000 g | INTRAVENOUS | Status: AC
Start: 2024-01-15 — End: 2024-01-15
  Administered 2024-01-15: 1 g via INTRAVENOUS
  Administered 2024-01-15: 0 g via INTRAVENOUS
  Filled 2024-01-15: qty 10

## 2024-01-15 MED ORDER — BUDESONIDE 0.5 MG/2 ML SUSPENSION FOR NEBULIZATION
0.5000 mg | INHALATION_SUSPENSION | Freq: Two times a day (BID) | RESPIRATORY_TRACT | Status: DC
Start: 2024-01-15 — End: 2024-01-20
  Administered 2024-01-15: 0.5 mg via RESPIRATORY_TRACT
  Administered 2024-01-16: 0 mg via RESPIRATORY_TRACT
  Administered 2024-01-16 – 2024-01-20 (×8): 0.5 mg via RESPIRATORY_TRACT
  Filled 2024-01-15 (×12): qty 2

## 2024-01-15 MED ORDER — ACETAMINOPHEN 1,000 MG/100 ML (10 MG/ML) INTRAVENOUS SOLUTION
1000.0000 mg | Freq: Four times a day (QID) | INTRAVENOUS | Status: AC
Start: 2024-01-15 — End: 2024-01-16
  Administered 2024-01-15: 1000 mg via INTRAVENOUS
  Administered 2024-01-15: 0 mg via INTRAVENOUS
  Administered 2024-01-16 (×2): 1000 mg via INTRAVENOUS
  Administered 2024-01-16: 0 mg via INTRAVENOUS
  Administered 2024-01-16: 1000 mg via INTRAVENOUS
  Administered 2024-01-16 (×2): 0 mg via INTRAVENOUS
  Filled 2024-01-15 (×4): qty 100

## 2024-01-15 MED ORDER — OXYBUTYNIN CHLORIDE 5 MG TABLET
5.0000 mg | ORAL_TABLET | Freq: Three times a day (TID) | ORAL | Status: DC
Start: 2024-01-15 — End: 2024-01-20
  Administered 2024-01-15 – 2024-01-16 (×2): 5 mg via ORAL
  Filled 2024-01-15 (×2): qty 1

## 2024-01-15 MED ORDER — DEXTROSE 5% IN WATER (D5W) FLUSH BAG - 250 ML
INTRAVENOUS | Status: DC | PRN
Start: 2024-01-15 — End: 2024-01-20

## 2024-01-15 MED ORDER — SODIUM CHLORIDE 0.9 % IRRIGATION SOLUTION
3000.0000 mL | Status: DC
Start: 2024-01-15 — End: 2024-01-17
  Administered 2024-01-15: 3000 mL
  Administered 2024-01-17: 0 mL

## 2024-01-15 MED ORDER — MORPHINE 2 MG/ML INJECTION WRAPPER
2.0000 mg | INJECTION | INTRAMUSCULAR | Status: DC | PRN
Start: 2024-01-15 — End: 2024-01-20
  Administered 2024-01-16 – 2024-01-20 (×12): 2 mg via INTRAVENOUS
  Filled 2024-01-15 (×12): qty 1

## 2024-01-15 MED ORDER — OLANZAPINE 10 MG TABLET
10.0000 mg | ORAL_TABLET | Freq: Every evening | ORAL | Status: DC
Start: 2024-01-15 — End: 2024-01-20
  Administered 2024-01-15 – 2024-01-19 (×5): 10 mg via ORAL
  Filled 2024-01-15 (×5): qty 1

## 2024-01-15 MED ORDER — IPRATROPIUM 0.5 MG-ALBUTEROL 3 MG (2.5 MG BASE)/3 ML NEBULIZATION SOLN
3.0000 mL | INHALATION_SOLUTION | Freq: Three times a day (TID) | RESPIRATORY_TRACT | Status: DC | PRN
Start: 2024-01-15 — End: 2024-01-20

## 2024-01-15 MED ORDER — ROSUVASTATIN 10 MG TABLET
10.0000 mg | ORAL_TABLET | Freq: Every evening | ORAL | Status: DC
Start: 2024-01-15 — End: 2024-01-20
  Administered 2024-01-15 – 2024-01-19 (×5): 10 mg via ORAL
  Filled 2024-01-15 (×5): qty 1

## 2024-01-15 MED ORDER — ARFORMOTEROL 15 MCG/2 ML SOLUTION FOR NEBULIZATION
15.0000 ug | INHALATION_SOLUTION | Freq: Two times a day (BID) | RESPIRATORY_TRACT | Status: DC
Start: 2024-01-15 — End: 2024-01-20
  Administered 2024-01-15 – 2024-01-16 (×2): 15 ug via RESPIRATORY_TRACT
  Administered 2024-01-16: 0 ug via RESPIRATORY_TRACT
  Administered 2024-01-17 – 2024-01-20 (×7): 15 ug via RESPIRATORY_TRACT
  Filled 2024-01-15 (×12): qty 1

## 2024-01-15 MED ORDER — ALBUTEROL SULFATE 2.5 MG/3 ML (0.083 %) SOLUTION FOR NEBULIZATION
2.5000 mg | INHALATION_SOLUTION | Freq: Four times a day (QID) | RESPIRATORY_TRACT | Status: DC | PRN
Start: 2024-01-15 — End: 2024-01-20

## 2024-01-15 MED ORDER — SODIUM CHLORIDE 0.9 % (FLUSH) INJECTION SYRINGE
10.0000 mL | INJECTION | Freq: Three times a day (TID) | INTRAMUSCULAR | Status: DC
Start: 2024-01-15 — End: 2024-01-20
  Administered 2024-01-15 – 2024-01-16 (×3): 10 mL
  Administered 2024-01-16 – 2024-01-18 (×7): 0 mL
  Administered 2024-01-19: 10 mL
  Administered 2024-01-19 – 2024-01-20 (×3): 0 mL
  Administered 2024-01-20: 10 mL

## 2024-01-15 MED ORDER — OXYGEN
4.0000 L/min | GAS_FOR_INHALATION | RESPIRATORY_TRACT | Status: DC
Start: 2024-01-15 — End: 2024-01-15

## 2024-01-15 MED ORDER — SODIUM CHLORIDE 0.9 % (FLUSH) INJECTION SYRINGE
10.0000 mL | INJECTION | INTRAMUSCULAR | Status: DC | PRN
Start: 2024-01-15 — End: 2024-01-20

## 2024-01-15 NOTE — ED Nurses Note (Signed)
Provided new linens and bed pan to pt at this time.

## 2024-01-15 NOTE — ED Triage Notes (Signed)
Pt presents to ED from home for evaluation of a foley catheter issue. He had a foley catheter placed recently and states he has been having intense bladder spasm. Catheter is flowing dark yellow urine. Pt c/o pain from the site

## 2024-01-15 NOTE — H&P (Signed)
[facility]  Department of Family Medicine  Inpatient History and Physical    Name: Hector [name]  Age: 63 y.o.   Gender: male  MRN: [id number]  Date of Admission:  01/15/2024  PCP: Robin Searing, MD  Attending: Cephus Shelling, DO  Consultants:   Consult Orders Placed This Encounter   Procedures    IP CONSULT TO UROLOGY Requested Provider; Idamae Lusher, Lisa Roca        SUBJECTIVE:      CHIEF COMPLAINT:  painful hematuria,     HISTORY OF PRESENT ILLNESS   Hector [name] is a 63 y.o. male with a PMH of COPD on 5L NC at baseline, HTN, HLD, alcohol use disorder in remission, liver cirrhosis presents due to painful hematuria. Patient was recently hospitalized from 01/05/24 to 01/09/24 for similar symptoms, with less pain at the time. During that admission, patient was started on CBI and evaluated by urology, and concern for possible malignancy was raised due to abnormal CT with soft tissue fullness at UVJ, with atypical urothelial cells on cytopathology. Patient at the time was referred to Urologic Oncology and has an appointment on 02/01/24 to establish with them.     Today, patient presents due to pain and continued hematuria, as well as inability to care for himself secondary to this. He states that the urine has lightened up occasionally but continues to be dark today. No frank red blood. He states that his pain with foley catheter changes. He states that his significant other is not able to help him as well. He reports abdominal pain at this time, but otherwise no concerning symptoms.    REVIEW OF SYSTEMS:   Review of Systems   Constitutional:  Positive for appetite change. Negative for chills, fatigue and fever.   HENT: Negative.     Eyes: Negative.    Respiratory:  Negative for cough, chest tightness and wheezing.    Cardiovascular:  Negative for chest pain, palpitations and leg swelling.   Gastrointestinal:  Positive for abdominal pain. Negative for blood in stool, diarrhea, nausea and vomiting.    Endocrine: Negative.    Genitourinary:  Positive for dysuria, hematuria and penile pain. Negative for urgency.        Foley in place   Musculoskeletal:  Positive for back pain.   Skin: Negative.    Allergic/Immunologic: Negative.    Neurological:  Negative for dizziness, weakness, light-headedness and numbness.   Hematological: Negative.    Psychiatric/Behavioral: Negative.         MEDICAL HISTORY     PAST MEDICAL HISTORY:  Past Medical History:   Diagnosis Date    Alcohol abuse     Carotid stenosis     Chronic obstructive airway disease (CMS HCC)     Cirrhosis (CMS HCC)     Closed fracture of transverse process of thoracic vertebra (CMS HCC) 05/01/2021    Coma (CMS HCC)     after fall due to alcholism May 2022    Delirium, withdrawal, alcoholic (CMS HCC)     Depression     Encephalopathy 05/15/2021    Esophageal reflux     HTN (hypertension)     Hyperlipidemia     Ileus (CMS HCC) 05/18/2021    Left pulmonary contusion 05/01/2021    Leukopenia 05/18/2021    Multiple rib fractures 05/01/2021    Oxygen dependent     Pleural effusion 05/11/2021    Serum ammonia increased (CMS HCC) 05/15/2021    Shortness of  breath     Spleen injury 05/01/2021    Status post thoracentesis 05/19/2021    Thrombocytopenia (CMS HCC) 05/01/2021    Unknown cause of injury     fx nose    Wears glasses            PAST SURGICAL HISTORY:  Past Surgical History:   Procedure Laterality Date    CAROTID STENT      COLONOSCOPY  08/2021    ESOPHAGOGASTRODUODENOSCOPY  08/2021    HX OTHER Right 06/29/2023    TCAR    HX TONSILLECTOMY      OTHER SURGICAL HISTORY      HX of plastic surgery to right head           FAMILY HISTORY:  Family Medical History:       Problem Relation (Age of Onset)    Asthma Father    Cerebral Aneurysm Sister    Dementia Mother    Heart Attack Paternal Grandmother (28)              SOCIAL HISTORY:  Social History     Tobacco Use   Smoking Status Every Day    Current packs/day: 1.00    Average packs/day: 1 pack/day for 51.1 years  (51.1 ttl pk-yrs)    Types: Cigarettes    Start date: 1974   Smokeless Tobacco Never   Tobacco Comments    THE PATIENT STARTED      Social History     Substance and Sexual Activity   Alcohol Use Not Currently    Alcohol/week: 15.0 standard drinks of alcohol    Types: 15 Cans of beer per week    Comment:  may 17th, 2022      Social History     Substance and Sexual Activity   Drug Use Yes    Types: Marijuana    Comment:  a joint or 2 a day        MEDICATIONS:  Current Outpatient Medications   Medication Instructions    albuterol sulfate (VENTOLIN HFA) 90 mcg/actuation Inhalation oral inhaler 2 Puffs, Inhalation, EVERY 6 HOURS PRN    aspirin (ECOTRIN) 81 mg, Oral, DAILY    clopidogreL (PLAVIX) 75 mg, Oral, DAILY    fluticasone propion-salmeteroL (ADVAIR HFA) 230-21 mcg/actuation Inhalation oral inhaler 2 Puffs, Inhalation, 2 TIMES DAILY, RINSE MOUTH WITH WATER AFTER INHALER USE    ipratropium-albuterol 0.5 mg-3 mg(2.5 mg base)/3 mL Solution for Nebulization 3 mL, Nebulization, 3 TIMES DAILY PRN    OLANZapine (ZYPREXA) 10 mg, Oral, NIGHTLY    oxygen (O2) 4 L/min, Nasal, CONTINUOUS    pantoprazole (PROTONIX) 40 mg Oral Tablet, Delayed Release (E.C.) TAKE 1 TABLET (40 MG TOTAL) BY MOUTH TWICE A DAY 30 MINTUTES BEFORE MEALS FOR 90 DAYS    rosuvastatin (CRESTOR) 10 mg, Oral, EVERY EVENING    tamsulosin (FLOMAX) 0.4 mg, Oral, EVERY EVENING AFTER DINNER    traZODone (DESYREL) 50 mg, Oral, NIGHTLY, Unsure dosage        ALLERGIES:  Allergies   Allergen Reactions    Bee Venom Protein (Honey Bee)      Bee stings        OBJECTIVE     BP (!) 148/62   Pulse 64   Temp 36.7 C (98 F)   Resp 20   Ht 1.753 m (5\' 9" )   Wt 104 kg (228 lb 13.4 oz)   SpO2 98%   BMI 33.79 kg/m     PHYSICAL EXAM:  Physical  Exam  Constitutional:       Appearance: He is obese.   HENT:      Head: Normocephalic and atraumatic.      Nose: Nose normal.      Mouth/Throat:      Mouth: Mucous membranes are moist.   Eyes:      Extraocular Movements:  Extraocular movements intact.      Pupils: Pupils are equal, round, and reactive to light.   Cardiovascular:      Rate and Rhythm: Normal rate and regular rhythm.      Pulses: Normal pulses.      Heart sounds: Normal heart sounds.   Pulmonary:      Effort: Pulmonary effort is normal.      Breath sounds: Normal breath sounds.   Abdominal:      General: There is no distension.      Palpations: Abdomen is soft.      Tenderness: There is generalized abdominal tenderness and tenderness in the suprapubic area, left upper quadrant and left lower quadrant.   Genitourinary:     Comments: Foley in place, draining dark pink-red urine  Musculoskeletal:         General: Normal range of motion.      Cervical back: Normal range of motion.   Skin:     General: Skin is warm and dry.   Neurological:      General: No focal deficit present.      Mental Status: He is alert and oriented to person, place, and time. Mental status is at baseline.   Psychiatric:         Mood and Affect: Mood normal.         Behavior: Behavior normal.         Thought Content: Thought content normal.         Judgment: Judgment normal.        LABS:    CBC Results Differential Results   Recent Labs     01/15/24  1432   WBC 3.5*   HGB 12.8*   HCT 37.7    Recent Labs     01/15/24  1432   PMNS 56   LYMPHOCYTES 27   MONOCYTES 13*   EOSINOPHIL 3   BASOPHILS 1  0.00   PMNABS 2.00   LYMPHSABS 1.00*   MONOSABS 0.50   EOSABS 0.10      BMP Results Other Chemistries Results   Recent Labs     01/15/24  1432   SODIUM 139   POTASSIUM 3.6   CHLORIDE 109*   CO2 23   BUN 19   CREATININE 0.92   GFR >60   ANIONGAP 7    Recent Labs     01/15/24  1432   CALCIUM 8.2*   ALBUMIN 3.2*   MAGNESIUM 1.8      Liver/Pancreas Enzyme Results Blood Gas     Recent Labs     01/15/24  1432   TOTALPROTEIN 6.1*   ALBUMIN 3.2*   AST 47   ALT 33   ALKPHOS 92   LIPASE 118    No results found for this encounter   Cardiac Results    Coags Results     No results for input(s): "UHCEASTTROPI", "CKMB",  "MBINDEX", "BNP" in the last 72 hours. No results for input(s): "INR" in the last 72 hours.    Invalid input(s): "PTT", "PT"     RADIOLOGY RESULTS:   Results for orders placed or performed during  the hospital encounter of 01/15/24 (from the past 72 hour(s))   CT ABDOMEN PELVIS WO IV CONTRAST     Status: None    Narrative    Armando LEE Cervenka    RADIOLOGIST: Carma Lair    CT ABDOMEN PELVIS WO IV CONTRAST performed on 01/15/2024 3:20 PM    CLINICAL HISTORY: obstruction.  c.o pain at urinary cathater site; bladder spasm    TECHNIQUE:  Abdomen and pelvis CT without intravenous contrast.    COMPARISON:  01/06/2024    FINDINGS:  Noncontrast technique limits evaluation of the abdominal and pelvic viscera.    Lung bases: The visualized right lung is clear. There is some likely round atelectasis in the left lower lobe.    Liver:   Cirrhotic liver morphology.    Gallbladder:   Cholelithiasis.    Spleen:   The spleen is enlarged measuring about 17 cm in craniocaudal extent.    Pancreas:   Unremarkable.    Adrenals:   Unremarkable.    Kidneys:   No hydronephrosis.    Bladder:  Foley catheter in the bladder. The bladder is decompressed.    Prostate:  Unremarkable.    Bowel:   Diverticulosis without evidence of acute diverticulitis. No evidence of obstruction. Possible wall thickening of the distal esophagus which can be seen with esophagitis.    Appendix:  No evidence of acute appendicitis.    Lymph nodes:  No suspicious lymph node enlargement.    Vasculature:   Mild to moderate atherosclerotic changes of the abdominal aorta. There are esophageal varices and portosystemic collaterals. There is a large portosystemic shunt from the splenic vein to the left common iliac vein.    Peritoneum / Retroperitoneum: No ascites.  No free air.    Bones:   Unremarkable.        Impression    Foley catheter in a decompressed bladder. There is likely some circumferential bladder wall thickening.    Other findings as above.       One or more  dose reduction techniques were used (e.g., Automated exposure control, adjustment of the mA and/or kV according to patient size, use of iterative reconstruction technique).      Radiologist location ID: [id number]         MICROBIOLOGY:  No results found for any visits on 01/15/24 (from the past 96 hour(s)).     ASSESSMENT AND PLAN     Tavon Magnussen is a 63 y.o. 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   # Inability to perform ADLs in the setting of above   - In the ED: CBC shows pancytopenia ( WBC 3.5, Hgb 12.8, Plt 67, at baseline) otherwise unremarkable, CMP largely unremarkable, Mag WNL, Lipase WNL, Lactate 0.6, ammonia WNL  - UA shows trace ketones, large blood and leukocytes, TNTC on microscopic, culture pending   - CT A/P WO IV Contrast: foley catheter in a decompressed, likely some circumferential bladder wall thickening   - Blood cultures drawn, pending  - s/p Rocephin 1g x 1 dose   - CBI initiated   - Continue Flomax 0.4 mg qPM  - Pain Control:   - Ofirmev 1000 mg q6h scheduled   - Morphine 2 mg q4h PRN for breakthrough pain   - Ditropan  5 mg TID for bladder spasms   - IP Consult to Urology, Dr. Idamae Lusher, appreciate recommendations   - PT/OT consulted     Other Chronic Conditions:  # COPD with chronic hypoxemic respiratory failure  - On baseline 5L NC   - Home dAdvair substitute with formulary budesonide/brovana nebs BID   - Continue home albuterol neb q6h PRN     # CAD   - home aspirin and Plavix currently held in case of possible procedure.     # Anemia, suspect secondary to liver cirrhosis   - Hgb on admission 12.8, baseline 12  - Iron studies: Ferritin 24, Iron 47, Iron sat 15, TIBC 320   - AM CBC     # Anxiety and Depression   - Continue home trazodone 50 mg nightly   - Continue home  Zyprexa 10 mg nightly     # GERD   - Continue home Protonix 40 mg daily     # Hyperlipidemia   - Continue home Crestor 10 mg qPM     Diet: DIET REGULAR  DVT/PE: SCDs  Code Status: FULL CODE: ATTEMPT RESUSCITATION/CPR    Disposition:  TBD - pending clinical course     Baxter Kail, MD, [phone], 01/15/2024  Family Medicine Residency, PGY-1  [facility], [facility]    This note was partially created using voice recognition software and is inherently subject to errors including those of syntax and "sound alike " substitutions which may escape proof reading.  In such instances, original meaning may be extrapolated by contextual derivation.

## 2024-01-15 NOTE — ED Provider Notes (Signed)
[facility] - Emergency Department    Attending Physician: Fara Olden, M.D.  CC:  Chief Complaint   Patient presents with    Urinary Catheter Problem     HPI:  Hector [name] is a 63 y.o. male who presents to the emergency department with chief complaint of urinary catheter pain along with inability to care for himself at home.  He was diagnosed with bladder cancer recently.  He denies active abdominal pain.  He is having difficulty caring for himself at home.  He has history of chronic respiratory failure on 5 L nasal cannula oxygen.    REVIEW OF SYSTEMS: All systems listed below were reviewed and are negative unless otherwise noted in the report:  [General]  [EYES]  [ENT]  [Cardiac]  [Respiratory]  [Gastrointestinal]  [Genitourinary]  [Musculoskeletal]  [Dermatologic]  [Neurological]    PMH:    Past Medical History:   Diagnosis Date    Alcohol abuse     Carotid stenosis     Chronic obstructive airway disease (CMS HCC)     Cirrhosis (CMS HCC)     Closed fracture of transverse process of thoracic vertebra (CMS HCC) 05/01/2021    Coma (CMS HCC)     after fall due to alcholism May 2022    Delirium, withdrawal, alcoholic (CMS HCC)     Depression     Encephalopathy 05/15/2021    Esophageal reflux     HTN (hypertension)     Hyperlipidemia     Ileus (CMS HCC) 05/18/2021    Left pulmonary contusion 05/01/2021    Leukopenia 05/18/2021    Multiple rib fractures 05/01/2021    Oxygen dependent     Pleural effusion 05/11/2021    Serum ammonia increased (CMS HCC) 05/15/2021    Shortness of breath     Spleen injury 05/01/2021    Status post thoracentesis 05/19/2021    Thrombocytopenia (CMS HCC) 05/01/2021    Unknown cause of injury     fx nose    Wears glasses          Previous Medications    ALBUTEROL SULFATE (VENTOLIN HFA) 90 MCG/ACTUATION INHALATION ORAL INHALER    Take 2 Puffs by inhalation Every 6 hours as needed for Other (SHORTNESS OF BREATH,COUGH OR WHEEZING)    ASPIRIN (ECOTRIN) 81 MG ORAL TABLET, DELAYED RELEASE  (E.C.)    Take 1 Tablet (81 mg total) by mouth Once a day    CLOPIDOGREL (PLAVIX) 75 MG ORAL TABLET    Take 1 Tablet (75 mg total) by mouth Once a day    FLUTICASONE PROPION-SALMETEROL (ADVAIR HFA) 230-21 MCG/ACTUATION INHALATION ORAL INHALER    Take 2 Puffs by inhalation Twice daily RINSE MOUTH WITH WATER AFTER INHALER USE    IPRATROPIUM-ALBUTEROL 0.5 MG-3 MG(2.5 MG BASE)/3 ML SOLUTION FOR NEBULIZATION    TAKE 3 ML BY NEBULIZATION THREE TIMES A DAY AS NEEDED FOR WHEEZING    OLANZAPINE (ZYPREXA) 10 MG ORAL TABLET    Take 1 Tablet (10 mg total) by mouth Every night    OXYGEN (O2) INHALATION GAS    Administer 4 L/min into affected nostril(s) continuous    PANTOPRAZOLE (PROTONIX) 40 MG ORAL TABLET, DELAYED RELEASE (E.C.)    TAKE 1 TABLET (40 MG TOTAL) BY MOUTH TWICE A DAY 30 MINTUTES BEFORE MEALS FOR 90 DAYS    ROSUVASTATIN (CRESTOR) 10 MG ORAL TABLET    TAKE 1 TABLET BY MOUTH EVERY DAY IN THE EVENING    TAMSULOSIN (FLOMAX) 0.4 MG ORAL CAPSULE  TAKE 1 CAPSULE (0.4 MG TOTAL) BY MOUTH EVERY EVENING AFTER DINNER    TRAZODONE (DESYREL) 50 MG ORAL TABLET    Take 1 Tablet (50 mg total) by mouth Every night Unsure dosage       PSH:    Past Surgical History:   Procedure Laterality Date    CAROTID STENT      COLONOSCOPY  08/2021    ESOPHAGOGASTRODUODENOSCOPY  08/2021    HX OTHER Right 06/29/2023    TCAR    HX TONSILLECTOMY      OTHER SURGICAL HISTORY      HX of plastic surgery to right head         Social Hx:    Social History     Socioeconomic History    Marital status: Significant Other     Spouse name: vickie    Number of children: 2    Years of education: 12    Highest education level: Not on file   Occupational History    Not on file   Tobacco Use    Smoking status: Every Day     Current packs/day: 1.00     Average packs/day: 1 pack/day for 51.1 years (51.1 ttl pk-yrs)     Types: Cigarettes     Start date: 69    Smokeless tobacco: Never    Tobacco comments:     THE PATIENT STARTED    Vaping Use    Vaping status: Never  Used   Substance and Sexual Activity    Alcohol use: Not Currently     Alcohol/week: 15.0 standard drinks of alcohol     Types: 15 Cans of beer per week     Comment:  may 17th, 2022     Drug use: Yes     Types: Marijuana     Comment:  a joint or 2 a day     Sexual activity: Yes   Other Topics Concern    Ability to Walk 1 Flight of Steps without SOB/CP No    Routine Exercise Not Asked    Ability to Walk 2 Flight of Steps without SOB/CP Not Asked    Unable to Ambulate Not Asked    Total Care Not Asked    Ability To Do Own ADL's Yes    Uses Walker No    Other Activity Level Not Asked    Uses Cane No   Social History Narrative    Not on file     Social Determinants of Health     Financial Resource Strain: Low Risk  (06/29/2023)    Financial Resource Strain     SDOH Financial: No   Transportation Needs: Low Risk  (06/29/2023)    Transportation Needs     SDOH Transportation: No   Social Connections: Low Risk  (01/05/2024)    Social Connections     SDOH Social Isolation: 5 or more times a week   Intimate Partner Violence: Low Risk  (06/29/2023)    Intimate Partner Violence     SDOH Domestic Violence: No   Housing Stability: Low Risk  (06/29/2023)    Housing Stability     SDOH Housing Situation: I have housing.     SDOH Housing Worry: No     Family Hx:   Family History   Problem Relation Age of Onset    Dementia Mother     Asthma Father     Cerebral Aneurysm Sister     Heart Attack Paternal Grandmother 17  fatal     Allergies:   Allergies   Allergen Reactions    Bee Venom Protein (Honey Bee)      Bee stings       Above history reviewed with patient, changes are as documented.    Physical Exam:   Nursing note and vitals reviewed.  ED Triage Vitals [01/15/24 1252]   BP (Non-Invasive) (!) 154/64   Heart Rate 58   Respiratory Rate 20   Temperature 36.8 C (98.3 F)   SpO2 98 %   Weight 104 kg (228 lb 13.4 oz)   Height 1.753 m (5\' 9" )     PHYSICAL EXAM:  VITAL SIGNS: Reviewed  GENERAL: On stretcher  EYES: No injection,  discharge or icterus.  ENT: Mucous membranes pink and moist. Pharynx without erythema or exudate.  NECK: Supple  RESPIRATORY: Airway patent. Symmetric chest wall rise.  CARDIOVASCULAR: Warm and well perfused.  ABDOMEN: Non-distended and non-tender.  No peritonitis rigidity or guarding  SKIN: Acyanotic.  EXTREMITIES: Without deformity  NEUROLOGICAL: Alert and cooperative      Laboratory Results:  Labs Ordered/Reviewed   COMPREHENSIVE METABOLIC PANEL, NON-FASTING - Abnormal; Notable for the following components:       Result Value    CHLORIDE 109 (*)     BUN/CREA RATIO 21 (*)     ALBUMIN 3.2 (*)     CALCIUM 8.2 (*)     PROTEIN TOTAL 6.1 (*)     ALBUMIN/GLOBULIN RATIO 1.1 (*)     All other components within normal limits    Narrative:     Estimated Glomerular Filtration Rate (eGFR) is calculated using the CKD-EPI (2021) equation, intended for patients 4 years of age and older. If gender is not documented or "unknown", there will be no eGFR calculation.   CBC WITH DIFF - Abnormal; Notable for the following components:    WBC 3.5 (*)     RBC 4.05 (*)     HGB 12.8 (*)     RDW 15.5 (*)     PLATELETS 67 (*)     MONOCYTE % 13 (*)     LYMPHOCYTE # 1.00 (*)     All other components within normal limits   URINALYSIS, MACRO/MICRO - Abnormal; Notable for the following components:    APPEARANCE Turbid (*)     LEUKOCYTES Large (*)     PROTEIN 300 (*)     KETONES Trace (*)     BLOOD Large (*)     RBCS TNTC (*)     WBCS TNTC (*)     HYALINE CASTS 11-20 (*)     All other components within normal limits   SCAN DIFFERENTIAL - Abnormal; Notable for the following components:    ANISOCYTOSIS Present (*)     TEARDROP CELLS Present (*)     OVALOCYTE (ELLIPTOCYTE) Present (*)     All other components within normal limits   LACTIC ACID LEVEL W/ REFLEX FOR LEVEL >2.0 - Abnormal; Notable for the following components:    LACTIC ACID 0.6 (*)     All other components within normal limits   LIPASE - Normal   MAGNESIUM - Normal   AMMONIA - Normal    URINE CULTURE,ROUTINE   ADULT ROUTINE BLOOD CULTURE, SET OF 2 BOTTLES (BACTERIA AND YEAST)   ADULT ROUTINE BLOOD CULTURE, SET OF 2 BOTTLES (BACTERIA AND YEAST)   CBC/DIFF    Narrative:     The following orders were created for panel order CBC/DIFF.  Procedure  Abnormality         Status                     ---------                               -----------         ------                     CBC WITH XBJY[[id number]]                Abnormal            Final result                 Please view results for these tests on the individual orders.   URINALYSIS WITH REFLEX MICROSCOPIC AND CULTURE IF POSITIVE    Narrative:     The following orders were created for panel order URINALYSIS WITH REFLEX MICROSCOPIC AND CULTURE IF POSITIVE.  Procedure                               Abnormality         Status                     ---------                               -----------         ------                     URINALYSIS, MACRO/MICRO[[id number]]      Abnormal            Final result                 Please view results for these tests on the individual orders.   MICRO HOLD         Radiographical Imaging:   Results for orders placed or performed during the hospital encounter of 01/15/24   CT ABDOMEN PELVIS WO IV CONTRAST     Status: None    Narrative    Edoardo LEE Bozza    RADIOLOGIST: Carma Lair    CT ABDOMEN PELVIS WO IV CONTRAST performed on 01/15/2024 3:20 PM    CLINICAL HISTORY: obstruction.  c.o pain at urinary cathater site; bladder spasm    TECHNIQUE:  Abdomen and pelvis CT without intravenous contrast.    COMPARISON:  01/06/2024    FINDINGS:  Noncontrast technique limits evaluation of the abdominal and pelvic viscera.    Lung bases: The visualized right lung is clear. There is some likely round atelectasis in the left lower lobe.    Liver:   Cirrhotic liver morphology.    Gallbladder:   Cholelithiasis.    Spleen:   The spleen is enlarged measuring about 17 cm in craniocaudal  extent.    Pancreas:   Unremarkable.    Adrenals:   Unremarkable.    Kidneys:   No hydronephrosis.    Bladder:  Foley catheter in the bladder. The bladder is decompressed.    Prostate:  Unremarkable.    Bowel:   Diverticulosis without evidence of acute diverticulitis. No evidence of obstruction. Possible wall thickening of the distal esophagus which can be seen with esophagitis.  Appendix:  No evidence of acute appendicitis.    Lymph nodes:  No suspicious lymph node enlargement.    Vasculature:   Mild to moderate atherosclerotic changes of the abdominal aorta. There are esophageal varices and portosystemic collaterals. There is a large portosystemic shunt from the splenic vein to the left common iliac vein.    Peritoneum / Retroperitoneum: No ascites.  No free air.    Bones:   Unremarkable.        Impression    Foley catheter in a decompressed bladder. There is likely some circumferential bladder wall thickening.    Other findings as above.       One or more dose reduction techniques were used (e.g., Automated exposure control, adjustment of the mA and/or kV according to patient size, use of iterative reconstruction technique).      Radiologist location ID: [id number]         EKG- No results found for this visit on 01/15/24 (from the past 720 hour(s)).      All labs were reviewed. Pertinent medical records reviewed.     Medical decision making and course:    ED Course as of 01/15/24 1822   Fri Jan 15, 2024   1820 CT ABDOMEN PELVIS WO IV CONTRAST  Results reviewed    1821 URINALYSIS WITH REFLEX MICROSCOPIC AND CULTURE IF POSITIVE(!)  Results reviewed    1821 LACTIC ACID(!): 0.6  Results reviewed    1821 WBC(!): 3.5  Results reviewed    1822 HGB(!): 12.8  Results reviewed      Medical Decision Making  63 year old male with the above symptoms.  Laboratory values obtained results reviewed imaging obtained results reviewed.    Discussed admission with family medicine.    Admitted in stable condition.    Amount and/or  Complexity of Data Reviewed  Labs: ordered. Decision-making details documented in ED Course.  Radiology: ordered. Decision-making details documented in ED Course.  ECG/medicine tests: independent interpretation performed.    Risk  Decision regarding hospitalization.         Disposition: Admitted         Clinical Impression:   Clinical Impression   Failure to thrive in adult (Primary)   UTI (urinary tract infection)   Chronic hypoxemic respiratory failure (CMS HCC)     Follow Up: No follow-up provider specified.  Medications Prescribed:   New Prescriptions    No medications on file         Fara Olden, MD

## 2024-01-16 DIAGNOSIS — R310 Gross hematuria: Secondary | ICD-10-CM

## 2024-01-16 DIAGNOSIS — N3000 Acute cystitis without hematuria: Secondary | ICD-10-CM

## 2024-01-16 DIAGNOSIS — D494 Neoplasm of unspecified behavior of bladder: Secondary | ICD-10-CM

## 2024-01-16 DIAGNOSIS — C679 Malignant neoplasm of bladder, unspecified: Secondary | ICD-10-CM

## 2024-01-16 MED ORDER — PHENAZOPYRIDINE 100 MG TABLET
100.0000 mg | ORAL_TABLET | Freq: Three times a day (TID) | ORAL | Status: DC
Start: 2024-01-16 — End: 2024-01-18
  Administered 2024-01-16 – 2024-01-18 (×6): 100 mg via ORAL
  Filled 2024-01-16 (×6): qty 1

## 2024-01-16 MED ORDER — BACLOFEN 10 MG TABLET
10.0000 mg | ORAL_TABLET | Freq: Once | ORAL | Status: AC
Start: 2024-01-16 — End: 2024-01-16
  Administered 2024-01-16: 10 mg via ORAL
  Filled 2024-01-16: qty 1

## 2024-01-16 MED ORDER — SODIUM CHLORIDE 0.9 % INTRAVENOUS PIGGYBACK
1.0000 g | INTRAVENOUS | Status: DC
Start: 2024-01-16 — End: 2024-01-20
  Administered 2024-01-16: 1 g via INTRAVENOUS
  Administered 2024-01-16 – 2024-01-17 (×2): 0 g via INTRAVENOUS
  Administered 2024-01-17: 1 g via INTRAVENOUS
  Administered 2024-01-18: 0 g via INTRAVENOUS
  Administered 2024-01-18 – 2024-01-19 (×2): 1 g via INTRAVENOUS
  Administered 2024-01-19: 0 g via INTRAVENOUS
  Administered 2024-01-20: 1 g via INTRAVENOUS
  Administered 2024-01-20: 0 g via INTRAVENOUS
  Filled 2024-01-16 (×5): qty 10

## 2024-01-16 MED ORDER — POLYETHYLENE GLYCOL 3350 17 GRAM ORAL POWDER PACKET
17.0000 g | Freq: Every day | ORAL | Status: DC
Start: 2024-01-16 — End: 2024-01-20
  Administered 2024-01-16 – 2024-01-20 (×5): 17 g via ORAL
  Filled 2024-01-16 (×5): qty 1

## 2024-01-16 MED ORDER — BACLOFEN 10 MG TABLET
10.0000 mg | ORAL_TABLET | ORAL | Status: AC
Start: 2024-01-16 — End: 2024-01-16
  Administered 2024-01-16: 10 mg via ORAL
  Filled 2024-01-16: qty 1

## 2024-01-16 NOTE — PT Evaluation (Signed)
[facility]   Acute Care Physical Therapy  Medical [address]  [place], [place] [address]  [phone]         Physical Therapy Acute Care  Evaluation    Date: 01/16/2024  Time: 09:47  Patient's Name: Hector [name]  Date of Birth: Jun 02, 1961  Room Number: 527/B    Physical Therapy Order & Physician:   Eval and Treat      Medical Diagnosis :    Hematuria     History of Present Illness :   Patient was admitted to the hospital with painful hematuria and inability to care for self.     Relevant Past Medical History :      Past Medical History:   Diagnosis Date    Alcohol abuse     Carotid stenosis     Chronic obstructive airway disease (CMS HCC)     Cirrhosis (CMS HCC)     Closed fracture of transverse process of thoracic vertebra (CMS HCC) 05/01/2021    Coma (CMS HCC)     after fall due to alcholism May 2022    Delirium, withdrawal, alcoholic (CMS HCC)     Depression     Encephalopathy 05/15/2021    Esophageal reflux     HTN (hypertension)     Hyperlipidemia     Ileus (CMS HCC) 05/18/2021    Left pulmonary contusion 05/01/2021    Leukopenia 05/18/2021    Multiple rib fractures 05/01/2021    Oxygen dependent     Pleural effusion 05/11/2021    Serum ammonia increased (CMS HCC) 05/15/2021    Shortness of breath     Spleen injury 05/01/2021    Status post thoracentesis 05/19/2021    Thrombocytopenia (CMS HCC) 05/01/2021    Unknown cause of injury     fx nose    Wears glasses             Precautions / Limitations :   Fall precautions     Weight Bearing status, if applicable :   No Weightbearing restrictions     Living Arrangement :   House    Living Environment Comment :   Patient can stay on 1-level or Stairlift     Currently Resides with :   Media planner / Significant Other    DME Available :   None    Current Home Oxygen Use :   None     Prior Level of Function including ambulation & transfers :   Independent    Subjective :   Patient reports he is having pain and bladder spasms.     Pain  Rating :   Pre-Treatment Pain: 8  Post-Treatment Pain: 8  Comments:                                             Orientation &  Level of Consciousness :   Alert, Cooperative, and Oriented Person and Place    Ability to Answer Questions & Follow Commands :   Simple     6-Clicks Score :    1.) Turning from back to side while flat in bed without using a bed rail.   A little assistance (3)  2.) Moving from lying on back to sitting on the side of a flat bed without using bed rails.  A little assistance (3)  3.) Moving to  and from a bed to a chair, including a wheelchair.   A little assistance (3)  4.) Standing up for chair using your arms (eg, wheelchair or bedside chair).  A little assistance (3)  5.) Walking in hospital room.  A lot of assistance (2)  6.) Climbing 3-5 steps with a railing.   A lot of assistance (2)    6 Clicks Raw Score: 16    6 Clicks Plan: Score <18 - PT consult is appropriate; placement may be necessary at discharge    Objective :    Patient demonstrates fair standing balance.     Strength :    Generalized Weakness / Debility        Neuro including sensation, coordination, reflexes & tone :    No apparent deficits    Range of Motion :     Range of Motion Right   Left   Upper Extremity  Within functional limits  Within functional limits   Lower Extremity   Within functional limits  Within functional limits           Current Functional Ability :     Transfers:  Rolling in bed:    Contact Assist  with assist of 1                                                Supine to sit:   Minimal Assist   with assist of 1                                Sit to stand:  Minimal Assist   with assist of 1       Gait:  Distance:   A few steps                Device Utilized:   Hand Hold Assist          Level of Assist:  Contact Guard   with assist of  1      Weight-Bearing Status:   No Weightbearing restrictions                       Gait Deviations: Step length decreased    Balance/Endurance:  Static Sitting  Balance:  Good - Able to maintain balance without assist            Static Standing  Balance:  Fair - Requires assist to maintain balance                                               Endurance:  Fair         PT Treatment Diagnosis :   Abnormality of Gait (Ortho/Neuro)      Rehabilitation Potential :    Good     Treatment Goals :     Transfers:            Rolling in bed:    Contact Assist  with assist of 1  Supine to sit:   Contact Assist   with assist of 1                                Sit to stand:  Contact Guard   with assist of 1                                              Gait:  Distance:   50'                Device Utilized:   Rollator Walker          Level of Assist:      Advertising account executive  with assist of  1      Weight-Bearing Status:   No Weightbearing restrictions            ROM:                           Strength:    Increase strength via therapeutic exercise for patient to accomplish stated mobility goals               Pain:                          Patient / Family Goal :   To go to skilled care     Treatment Plan  :   Gait training , Transfer training , Therapeutic exercise / Range of Motion exercises , and Patient / Family education      Therapy Frequency :   3-5 times a week     Time Estimate of Goal Attainment  :   5 days    Progress Note :   Patient performed supine to sit transfer x min A x 1 and sit to stand x min A x 1. Stood edge of bed and took 3 side steps x HHA x 2. Patient returned to supine x min A x 1.     Evaluation Complexity :   Low    Low: History 0; Examination 1-2; Presentation Stable   Moderate: History 1-2; Examination 3+; Presentation Evolving   High: History 3+; Examination 4+; Presentation Evolving / Unstable     Treatment Provided This Session :   Transfer training  and Patient / Family education     Charges :     Timed Charge Number of Minutes   Neuromuscular Re-Education     Gait Training     Therapeutic Activity  10   Therapeutic Exercise       Total Treatment Time  10     Non Timed Charge Number of  Units   Physical Therapy  Evaluation Low   1   Physical Therapy  Evaluation Moderate              Physical Therapy  Evaluation High                   Re-Evaluation               Canalith Repositioning                     Post Discharge Equipment Needs :    To Be Determined  Recommended Discharge Disposition  :   Skilled Care  -  Based on current diagnosis, functional performance prior to admission and current functional presentation, this patient requires continued Physical Therapy services in a skilled care facility in order to achieve significant functional improvements in one or more of these deficit areas:  aerobic capacity/endurance; gait, locomotion and balance; motor function; muscle performance; posture; range of motion    The above recommendation is based upon the current examination and evaluation performed on this date by this physical therapist.   As subsequent sessions are completed, recommendations will be updated accordingly.      Physical Therapy Plan of Care was discussed with patient / family ? :   Yes    Becky Augusta, PT, DPT 01/16/2024 11:14

## 2024-01-16 NOTE — Care Plan (Signed)
POC reviewed and continued      Problem: Adult Inpatient Plan of Care  Goal: Plan of Care Review  Outcome: Ongoing (see interventions/notes)  Goal: Patient-Specific Goal (Individualized)  Outcome: Ongoing (see interventions/notes)  Flowsheets (Taken 01/16/2024 0116)  Individualized Care Needs: maintain safety  Anxieties, Fears or Concerns: spasms  Goal: Absence of Hospital-Acquired Illness or Injury  Outcome: Ongoing (see interventions/notes)  Intervention: Identify and Manage Fall Risk  Recent Flowsheet Documentation  Taken 01/16/2024 0116 by Irineo Axon, RN  Safety Promotion/Fall Prevention:   activity supervised   nonskid shoes/slippers when out of bed   safety round/check completed  Intervention: Prevent Skin Injury  Recent Flowsheet Documentation  Taken 01/16/2024 0116 by Irineo Axon, RN  Body Position: supine, head elevated  Skin Protection:   adhesive use limited   incontinence pads utilized   protective footwear used   preventative decubiti skin protection foam dressing applied/intact  Intervention: Prevent and Manage VTE (Venous Thromboembolism) Risk  Recent Flowsheet Documentation  Taken 01/16/2024 0116 by Irineo Axon, RN  VTE Prevention/Management:   ambulation promoted   sequential compression device initiated   sequential compression devices on  Goal: Optimal Comfort and Wellbeing  Outcome: Ongoing (see interventions/notes)  Intervention: Provide Person-Centered Care  Recent Flowsheet Documentation  Taken 01/16/2024 0116 by Irineo Axon, RN  Trust Relationship/Rapport:   care explained   choices provided   reassurance provided  Goal: Rounds/Family Conference  Outcome: Ongoing (see interventions/notes)     Problem: Skin Injury Risk Increased  Goal: Skin Health and Integrity  Outcome: Ongoing (see interventions/notes)  Intervention: Optimize Skin Protection  Recent Flowsheet Documentation  Taken 01/16/2024 0116 by Irineo Axon, RN  Skin Protection:   adhesive use limited   incontinence pads utilized   protective footwear used    preventative decubiti skin protection foam dressing applied/intact  Activity Management: bedrest  Head of Bed (HOB) Positioning: HOB at 20 degrees     Problem: Fall Injury Risk  Goal: Absence of Fall and Fall-Related Injury  Outcome: Ongoing (see interventions/notes)  Intervention: Promote Injury-Free Environment  Recent Flowsheet Documentation  Taken 01/16/2024 0116 by Irineo Axon, RN  Safety Promotion/Fall Prevention:   activity supervised   nonskid shoes/slippers when out of bed   safety round/check completed

## 2024-01-16 NOTE — Care Plan (Signed)
Patient c/o intermittent bladder spasms. CBI is running clear to pink. No clots noted. VSS. He remains on 5 liters 02 NC, which is his baseline. He is receiving tylenol and morphine for pain as needed. Patient is receiving rocephin IV daily. He is for repeat labs in the AM.

## 2024-01-16 NOTE — Progress Notes (Signed)
[facility]  Department of Family Medicine  Inpatient Progress Note      Name: Hector [name]  Age: 63 y.o.   Gender: male  MRN: [id number]  Date of Admission:  01/15/2024  PCP: Robin Searing, MD  Attending: Cephus Shelling, DO  Consultants:   Consult Orders Placed This Encounter   Procedures    IP CONSULT TO UROLOGY Requested Provider; Idamae Lusher, MICHELLE JO    IP CONSULT TO SOCIAL WORK       SUBJECTIVE:      Patient seen and evaluated at bedside. No acute events reported overnight. He continues to have bladder spasms this morning and suprapubic pain. He has a catheter in situ which is draining bright pink/orange colored urine, with good urine output. He denies any fevers, chills, chest pain, SOB, cough, Abdo pain, N/V, or bowel issues. He is tolerating PO intake. Awaiting urology review.     Review of Systems:  As above    OBJECTIVE     BP (!) 146/63   Pulse 64   Temp 36.6 C (97.8 F)   Resp 17   Ht 1.753 m (5\' 9" )   Wt 103 kg (227 lb 11.8 oz)   SpO2 97%   BMI 33.63 kg/m     Physical Exam  Constitutional:       Appearance: He is obese.   HENT:      Head: Normocephalic and atraumatic.      Nose: Nose normal.      Mouth/Throat:      Mouth: Mucous membranes are moist.   Eyes:      Extraocular Movements: Extraocular movements intact.      Pupils: Pupils are equal, round, and reactive to light.   Cardiovascular:      Rate and Rhythm: Normal rate and regular rhythm.      Pulses: Normal pulses.      Heart sounds: Normal heart sounds.   Pulmonary:      Effort: Pulmonary effort is normal.      Breath sounds: Normal breath sounds.   Abdominal:      General: There is no distension.      Palpations: Abdomen is soft.      Tenderness: There is mild tenderness in the suprapubic area  Genitourinary:     Comments: Foley in place, draining bright pink-orange urine  Musculoskeletal:         General: Normal range of motion.      Cervical back: Normal range of motion.   Skin:     General: Skin is warm and dry.    Neurological:      General: No focal deficit present.      Mental Status: He is alert and oriented to person, place, and time. Mental status is at baseline.   Psychiatric:         Mood and Affect: Mood normal.         Behavior: Behavior normal.         Thought Content: Thought content normal.         Judgment: Judgment normal.    Labs:    CBC Results Differential Results   Recent Labs     01/15/24  1432   WBC 3.5*   HGB 12.8*   HCT 37.7    Recent Labs     01/15/24  1432   PMNS 56   LYMPHOCYTES 27   MONOCYTES 13*   EOSINOPHIL 3   BASOPHILS 1  0.00  PMNABS 2.00   LYMPHSABS 1.00*   MONOSABS 0.50   EOSABS 0.10      BMP Results Other Chemistries Results   Recent Labs     01/15/24  1432   SODIUM 139   POTASSIUM 3.6   CHLORIDE 109*   CO2 23   BUN 19   CREATININE 0.92   GFR >60   ANIONGAP 7    Recent Labs     01/15/24  1432   CALCIUM 8.2*   ALBUMIN 3.2*   MAGNESIUM 1.8      Liver/Pancreas Enzyme Results Blood Gas     Recent Labs     01/15/24  1432   TOTALPROTEIN 6.1*   ALBUMIN 3.2*   AST 47   ALT 33   ALKPHOS 92   LIPASE 118    No results found for this encounter   Cardiac Results    Coags Results     No results for input(s): "UHCEASTTROPI", "CKMB", "MBINDEX", "BNP" in the last 72 hours. No results for input(s): "INR" in the last 72 hours.    Invalid input(s): "PTT", "PT"     In's and Out's:    Intake/Output Summary (Last 24 hours) at 01/16/2024 1130  Last data filed at 01/16/2024 1044  Gross per 24 hour   Intake 16109 ml   Output 17200 ml   Net -1800 ml       Radiology Results:  No results found.    Microbiology:  Hospital Encounter on 01/15/24 (from the past 24 hour(s))   URINE CULTURE,ROUTINE    Collection Time: 01/15/24  2:32 PM    Specimen: Urine, Site not specified   Culture Result Status    URINE CULTURE >100,000 Gram Negative Rods, Oxidase Negative (A) Preliminary       ASSESSMENT AND PLAN     Hector [name] is a 63 y.o. year-old male admitted for  Active Hospital Problems    Diagnosis    Primary Problem: Hematuria     Inability to perform activities of daily living     # Painful hematuria, with indwelling foley catheter, suspected bladder malignancy awaiting further Uro/Onc workup.   # Inability to perform ADLs in the setting of above   - In the ED: CBC shows pancytopenia ( WBC 3.5, Hgb 12.8, Plt 67, at baseline) otherwise unremarkable, CMP largely unremarkable, Mag WNL, Lipase WNL, Lactate 0.6, ammonia WNL  - CT A/P WO IV Contrast: foley catheter in a decompressed, likely some circumferential bladder wall thickening   - UA shows trace ketones, large blood and leukocytes, culture positive for gram neg rods, oxidase negative  - Blood cultures drawn, pending  - Continue IV Rocephin   - CBI  - Hold Flomax 0.4 mg qPM and Hold Ditropan 5mg  TID   - Start Pyridium 100 mg TID  - Bowel regimen  - Pain Control:   - Ofirmev 1000 mg q6h scheduled   - Morphine 2 mg q4h PRN for breakthrough pain   - IP Consult to Urology, Dr. Idamae Lusher, appreciate recommendations   - PT/OT/SS consulted      Other Chronic Conditions:  # COPD with chronic hypoxemic respiratory failure  - On baseline 5L NC   - Home dAdvair substitute with formulary budesonide/brovana nebs BID   - Continue home albuterol neb q6h PRN      # CAD   - home aspirin and Plavix currently held in case of possible procedure.      # Anemia, suspect secondary to liver cirrhosis   -  Hgb on admission 12.8, baseline 12  - Iron studies: Ferritin 24, Iron 47, Iron sat 15, TIBC 320   - AM CBC      # Anxiety and Depression   - Continue home trazodone 50 mg nightly   - Continue home Zyprexa 10 mg nightly      # GERD   - Continue home Protonix 40 mg daily      # Hyperlipidemia   - Continue home Crestor 10 mg qPM     Diet: DIET REGULAR  DVT/PE: SCDs   Code Status: FULL CODE: ATTEMPT RESUSCITATION/CPR    Disposition: Home when medically stable    Toni Hoffmeister Mitchell Heir, MD   Family Medicine Resident, PGY-1  [facility]

## 2024-01-16 NOTE — Consults (Signed)
[facility]  Urology Consultation Initial       [name], Hector [name], 63 y.o. male  Date of Admission:  01/15/2024  Date of Service:  01/16/2024  Date of Birth:  1961/08/13    PCP: Robin Searing, MD.  Consult Requested By: primary team        Subjective      HPI: (must include no less than 4 of the following main descriptors) Location (of pain): Quality (character of pain) Severity (minimal, mild, severe, scale or 1-10) Duration (how long has pain/sx present) Timing (when does pain/sx occur)  Context (activity at/before onset) Modifying Factors (what makes pain/sx  Better/worse) Associate Sign/Sx (what accompanies main pain/sx)     Hector [name] is a 63 y.o., White male who presents with bladder pain and decreased mobility    Patient was in the hospital a couple of weeks ago and urology was consulted, seen by Dr. Nolon Bussing. He had hematuria for number of months and presented to the ER for evaluation of the gross hematuria. CT urogram showed a 1.7 cm fullness at the right UVJ suspicious for malignancy. This was reviewed personally and I agree, there is delayed excretion on the CTIVP on the right side. No major hydro. Foley was placed and urine was clear/light pink at discharge. He was discharged with foley catheter. He has appt scheduled in 2 weeks with Urologic oncology in [place]. Cytology came back atypical degenerated cells.     Patient re-presents with difficulty with ADLs and he has bladder spasms with the foley catheter. He is getting narcotics, ditropan ATC, Flomax.    Culture is positive, speciation and sensitivities pending, currently on rocephin  >100,000 Gram Negative Rods, Oxidase Negative Abnormal      CT repeated and no new findings urologically - reviewed personally    Prior to his first admission, other than hematuria voiding:  Patient states that he was having some difficulty voiding before he came to the hospital last admission.  Said he felt like he did not empty completely.    Patient has a  history of alcoholism with cirrhosis, chronic obstructive pulmonary disease, thrombocytopenia, and elevated cholesterol. Patient was on Plavix and aspirin, currently being held.      Information Obtained from: patient    ROS:  MUST comment on all "Abnormal" findings   ROS Other than ROS in the HPI, all other systems were negative.      PAST MEDICAL/ FAMILY/ SOCIAL HISTORY:    Reviewed/ Summarized records and/or obtained History from patient  Past Medical History:   Diagnosis Date    Alcohol abuse     Carotid stenosis     Chronic obstructive airway disease (CMS HCC)     Cirrhosis (CMS HCC)     Closed fracture of transverse process of thoracic vertebra (CMS HCC) 05/01/2021    Coma (CMS HCC)     after fall due to alcholism May 2022    Delirium, withdrawal, alcoholic (CMS HCC)     Depression     Encephalopathy 05/15/2021    Esophageal reflux     HTN (hypertension)     Hyperlipidemia     Ileus (CMS HCC) 05/18/2021    Left pulmonary contusion 05/01/2021    Leukopenia 05/18/2021    Multiple rib fractures 05/01/2021    Oxygen dependent     Pleural effusion 05/11/2021    Serum ammonia increased (CMS HCC) 05/15/2021    Shortness of breath     Spleen injury 05/01/2021  Status post thoracentesis 05/19/2021    Thrombocytopenia (CMS HCC) 05/01/2021    Unknown cause of injury     fx nose    Wears glasses          Allergies   Allergen Reactions    Bee Venom Protein (Honey Bee)      Bee stings     Medications Prior to Admission       Prescriptions    albuterol sulfate (VENTOLIN HFA) 90 mcg/actuation Inhalation oral inhaler    Take 2 Puffs by inhalation Every 6 hours as needed for Other (SHORTNESS OF BREATH,COUGH OR WHEEZING)    aspirin (ECOTRIN) 81 mg Oral Tablet, Delayed Release (E.C.)    Take 1 Tablet (81 mg total) by mouth Once a day    clopidogreL (PLAVIX) 75 mg Oral Tablet    Take 1 Tablet (75 mg total) by mouth Once a day    fluticasone propion-salmeteroL (ADVAIR HFA) 230-21 mcg/actuation Inhalation oral inhaler    Take 2  Puffs by inhalation Twice daily RINSE MOUTH WITH WATER AFTER INHALER USE    ipratropium-albuterol 0.5 mg-3 mg(2.5 mg base)/3 mL Solution for Nebulization    TAKE 3 ML BY NEBULIZATION THREE TIMES A DAY AS NEEDED FOR WHEEZING    OLANZapine (ZYPREXA) 10 mg Oral Tablet    Take 1 Tablet (10 mg total) by mouth Every night    oxygen (O2) Inhalation gas    Administer 4 L/min into affected nostril(s) continuous    pantoprazole (PROTONIX) 40 mg Oral Tablet, Delayed Release (E.C.)    TAKE 1 TABLET (40 MG TOTAL) BY MOUTH TWICE A DAY 30 MINTUTES BEFORE MEALS FOR 90 DAYS    rosuvastatin (CRESTOR) 10 mg Oral Tablet    TAKE 1 TABLET BY MOUTH EVERY DAY IN THE EVENING    tamsulosin (FLOMAX) 0.4 mg Oral Capsule    TAKE 1 CAPSULE (0.4 MG TOTAL) BY MOUTH EVERY EVENING AFTER DINNER    traZODone (DESYREL) 50 mg Oral Tablet    Take 1 Tablet (50 mg total) by mouth Every night Unsure dosage           acetaminophen (OFIRMEV) 1,000 mg (10 mg/mL) IV 100 mL (tot vol), 1,000 mg, Intravenous, Q6HRS  albuterol (PROVENTIL) 2.5 mg / 3 mL (0.083%) neb solution, 2.5 mg, Nebulization, Q6H PRN  budesonide (PULMICORT RESPULES) 0.5 mg/2 mL nebulizer suspension, 0.5 mg, Nebulization, 2x/day   And  arformoterol (BROVANA) 15 mcg/2 mL nebulizer solution, 15 mcg, Nebulization, 2x/day  [Held by provider] aspirin (ECOTRIN) enteric coated tablet 81 mg, 81 mg, Oral, Daily  cefTRIAXone (ROCEPHIN) 1 g in NS 100 mL IVPB - DOCKED, 1 g, Intravenous, Q24H  [Held by provider] clopidogrel (PLAVIX) 75 mg tablet, 75 mg, Oral, Daily  D5W 250 mL flush bag, , Intravenous, Q15 Min PRN  ipratropium-albuterol 0.5 mg-3 mg(2.5 mg base)/3 mL Solution for Nebulization, 3 mL, Nebulization, 3x/day PRN  morphine 2 mg/mL injection, 2 mg, Intravenous, Q4H PRN  NS 250 mL flush bag, , Intravenous, Q15 Min PRN  NS flush syringe, 10 mL, Intracatheter, Q8HRS  NS flush syringe, 10 mL, Intracatheter, Q1H PRN  NS irrigation, 3,000 mL, Irrigation, Continuous  OLANZapine (zyPREXA) tablet, 10 mg,  Oral, NIGHTLY  [Held by provider] oxyBUTYnin (DITROPAN) tablet, 5 mg, Oral, 3x/day  pantoprazole (PROTONIX) delayed release tablet, 40 mg, Oral, Daily  phenazopyridine (PYRIDIUM) tablet, 100 mg, Oral, 3x/day-Meals  rosuvastatin (CRESTOR) tablet, 10 mg, Oral, QPM  [Held by provider] tamsulosin (FLOMAX) capsule, 0.4 mg, Oral, Daily after [employer]  traZODone (DESYREL) tablet, 50 mg, Oral, NIGHTLY      Past Surgical History:   Procedure Laterality Date    CAROTID STENT      COLONOSCOPY  08/2021    ESOPHAGOGASTRODUODENOSCOPY  08/2021    HX OTHER Right 06/29/2023    TCAR    HX TONSILLECTOMY      OTHER SURGICAL HISTORY      HX of plastic surgery to right head         Family History:     Family Medical History:       Problem Relation (Age of Onset)    Asthma Father    Cerebral Aneurysm Sister    Dementia Mother    Heart Attack Paternal Grandmother (56)           Social History     Tobacco Use    Smoking status: Every Day     Current packs/day: 1.00     Average packs/day: 1 pack/day for 51.1 years (51.1 ttl pk-yrs)     Types: Cigarettes     Start date: 47    Smokeless tobacco: Never    Tobacco comments:     THE PATIENT STARTED    Vaping Use    Vaping status: Never Used   Substance Use Topics    Alcohol use: Not Currently     Alcohol/week: 15.0 standard drinks of alcohol     Types: 15 Cans of beer per week     Comment:  may 17th, 2022     Drug use: Yes     Types: Marijuana     Comment:  a joint or 2 a day        Objective     PHYSICAL EXAMINATION: MUST comment on all "Abnormal" findings       Temperature: 36.6 C (97.8 F)  Heart Rate: 64  BP (Non-Invasive): (!) 146/63  Respiratory Rate: 17  SpO2: 97 %  Alert and oriented no apparent distress  Respirations nonlabored  Abdomen soft nontender nondistended  Circumcised phallus non stenotic orthotopic meatus no signs of erosion  Urine crystal clear a tint of pink, no clots  No edema     Labs Ordered/ Reviewed (Please indicate ordered or reviewed)   Reviewed: Labs:  I have  reviewed all lab results.  CBC with Diff (Last 48 Hours):    Recent Results in last 48 hours     01/15/24  1432   WBC 3.5*   HGB 12.8*   HCT 37.7   MCV 93.1   PLTCNT 67*   PMNS 56   LYMPHOCYTES 27   MONOCYTES 13*   EOSINOPHIL 3   BASOPHILS 1  0.00     BMP (Last 48 Hours):    Recent Results in last 48 hours     01/15/24  1432   SODIUM 139   POTASSIUM 3.6   CHLORIDE 109*   CO2 23   BUN 19   CREATININE 0.92   CALCIUM 8.2*   GLUCOSENF 91                 Recent Results (from the past [id number] hour(s))   CT ABDOMEN PELVIS WO IV CONTRAST    Collection Time: 01/15/24  3:20 PM    Narrative    Aurther Loft LEE Crib[place]    RADIOLOGIST: Carma Lair    CT ABDOMEN PELVIS WO IV CONTRAST performed on 01/15/2024 3:20 PM    CLINICAL HISTORY: obstruction.  c.o pain at urinary cathater site; bladder spasm  TECHNIQUE:  Abdomen and pelvis CT without intravenous contrast.    COMPARISON:  01/06/2024    FINDINGS:  Noncontrast technique limits evaluation of the abdominal and pelvic viscera.    Lung bases: The visualized right lung is clear. There is some likely round atelectasis in the left lower lobe.    Liver:   Cirrhotic liver morphology.    Gallbladder:   Cholelithiasis.    Spleen:   The spleen is enlarged measuring about 17 cm in craniocaudal extent.    Pancreas:   Unremarkable.    Adrenals:   Unremarkable.    Kidneys:   No hydronephrosis.    Bladder:  Foley catheter in the bladder. The bladder is decompressed.    Prostate:  Unremarkable.    Bowel:   Diverticulosis without evidence of acute diverticulitis. No evidence of obstruction. Possible wall thickening of the distal esophagus which can be seen with esophagitis.    Appendix:  No evidence of acute appendicitis.    Lymph nodes:  No suspicious lymph node enlargement.    Vasculature:   Mild to moderate atherosclerotic changes of the abdominal aorta. There are esophageal varices and portosystemic collaterals. There is a large portosystemic shunt from the splenic vein to the left common  iliac vein.    Peritoneum / Retroperitoneum: No ascites.  No free air.    Bones:   Unremarkable.        Impression    Foley catheter in a decompressed bladder. There is likely some circumferential bladder wall thickening.    Other findings as above.       One or more dose reduction techniques were used (e.g., Automated exposure control, adjustment of the mA and/or kV according to patient size, use of iterative reconstruction technique).      Radiologist location ID: [id number]            Assessment:   62yo male with gross hematuria for months, CT suspicious for malignancy at the right UVJ, currently with catheter and with catheter discomfort. Has multiple medical problems including thrombocytopenia, cirrhosis. Has f/consult with uro onc in [place] in 2 weeks.    Plan:    2 weeks culture specific antibiotics  Would hold on narcotics as they tend to increase constipation which exacerbates bladder spasms  Ditropan can help with spasms but also can cause constipation so advise good bowel regimen, patient notes he has not had a bowel movement today  Myrbetriq is another option with a different mechanism that could be trialed instead of ditropan or in addition to ditropan that is less constipating  Pyridium could be trialed as well  PT/OT as appropriate  Hold aspirin/Plavix if medically safe  Patient agrees that is best to leave catheter in place for now as he was having difficulty voiding previously  Changing catheter not indicated as just placed 1/21 unless not functioning  Could consider downsizing but also manipulation could stir up bleeding in which case would have to be re-upsized; higher risk due to anticoagulation and thrombocytopenia; patient prefers to leave current catheter in place  F/u with uro onc as planned in 2 weeks    We will sign off for now  Please re-consult or call if any further questions, up     Marjorie Smolder, MD

## 2024-01-16 NOTE — OT Evaluation (Signed)
[facility]  Rehabilitation Services  Occupational Therapy Initial Evaluation    Patient Name: Hector [name]  Date of Birth: [birth year]  Height:  175.3 cm (5\' 9" )  Weight:  103 kg (227 lb 11.8 oz)  Room/Bed: 527/B  Payor: [employer] MEDICAID / Plan: HEALTH PLAN MEDICAID / Product Type: Medicaid [place] /     Assessment:      01/16/24 0946   Therapist Pager   OT Assigned/ Pager # Janan Halter, OTD, OTR/L; ext [phone]   Daily Activity AM-PAC/6-clicks Score   Putting on/Taking off clothing on lower body 2   Bathing 2   Toileting 2   Putting on/Taking off clothing on upper body 3   Personal grooming 3   Eating Meals 3   Raw Score Total 15   Standardized (t-scale) Score 34.69   CMS 0-100% Score 56.46   CMS Modifier CK       Diagnosis: Hematuria    General information:  Pt is 63 y.o. male with urinary catheter pain and inability to care for self at home. Pt with recent bladder cancer dx and on 4L O2 currently.    Past Medical History:   Diagnosis Date    Alcohol abuse     Carotid stenosis     Chronic obstructive airway disease (CMS HCC)     Cirrhosis (CMS HCC)     Closed fracture of transverse process of thoracic vertebra (CMS HCC) 05/01/2021    Coma (CMS HCC)     after fall due to alcholism May 2022    Delirium, withdrawal, alcoholic (CMS HCC)     Depression     Encephalopathy 05/15/2021    Esophageal reflux     HTN (hypertension)     Hyperlipidemia     Ileus (CMS HCC) 05/18/2021    Left pulmonary contusion 05/01/2021    Leukopenia 05/18/2021    Multiple rib fractures 05/01/2021    Oxygen dependent     Pleural effusion 05/11/2021    Serum ammonia increased (CMS HCC) 05/15/2021    Shortness of breath     Spleen injury 05/01/2021    Status post thoracentesis 05/19/2021    Thrombocytopenia (CMS HCC) 05/01/2021    Unknown cause of injury     fx nose    Wears glasses          Past Surgical History:   Procedure Laterality Date    CAROTID STENT      COLONOSCOPY  08/2021    ESOPHAGOGASTRODUODENOSCOPY  08/2021    HX OTHER  Right 06/29/2023    TCAR    HX TONSILLECTOMY      OTHER SURGICAL HISTORY      HX of plastic surgery to right head           Living environment and set-up: Lives in house, chairlift      Prior level of function: (I) ADLs    Assistive devices used: none      Pain:  0   Numerical                Location: NA    Restrictions/Precautions: Fall, catheter    Weight bearing status: FWB    UE assessment:   Dominant:  right     Right   Left   Upper Extremity       Tone [facility] WFL   Coordination WNL WNL   AROM WFL WFL   PROM [facility]   Strength   4  4  Cognitive Assessment: Orientation:Person, Place, Time, and Situation Attention:distractible  Following commands:WFL    Behavior/mood:  cooperative    Visual Assessment: WFL    Current level of function:   Mobility:   Rolling in bed:  Minimum assistance   Supine to sit:  Minimum assistance   Sit to supine:Minimum assistance   Sit to stand transfer:  Minimum assistance   Toilet transfer:  not tested   Bed transfer:  Minimum assistance   Chair transfer: not tested   Sitting balance:  Good  Standing balance:  Fair    ADL's:  Eating:  Set-up   Grooming:  Set-up   UB dressing:  Contact guard   LB dressing:  Maximum assistance   Bathing:  Maximum assistance   Toileting:  Maximum assistance     Assistive devices used: none      Overall Impairments : impaired balance, decreased strength, and decreased endurance     Evaluation complexity Justification:   Occupational Profile Review: brief history  Evaluation complexity: low    Plan:       Criteria for skilled interventions met: yes    Rehab potential:Good    OT Interventions: ADL retraining, bed mobility training, transfer training, strengthening, and balance training    To provide Occupational therapy services minimum of 3x/wk, until discharge       The risks/benefits of therapy have been discussed with the patient/caregiver and he/she is in agreement with the established plan of care.     Goals/POC:   Pt will be Independent with UB  dressing by discharge  Pt will be Minimum assistance with LB dressing and toileting by discharge  Pt will increase BUE strength to 5/5 in order to assist with mobility by discharge  Pt will be Stand by assist with transfers and mobility using least restrictive AD by discharge      Discharge Needs:   Equipment Recommendation: walker and TBD    The patient presents with self care/ mobility limitations due to impaired balance, impaired strength, and impaired functional activity tolerance that significantly impair/prevent patient's ability to participate in mobility-related activities of daily living (MRADLs) including  ambulation and transfers in order to safely complete, toileting, bathing, laundering/household tasks, safely entering/exiting the home.   Discharge Disposition: skilled nursing facility  Based on diagnosis and level of independence with I/ADLs and mobility prior to admission vs current functional presentation/deficits, this patient requires continued OT services in a skilled care facility in order to achieve significant functional improvements in one or more of these deficit areas: self care; IADL tasks, functional mobility; neuro/motor function; balance; endurance; range of motion, cognition     Subjective & Objective:     Pt is 63 y.o. male with urinary catheter pain and inability to care for self at home. Pt with recent bladder cancer dx and on 4L O2 currently.  Patient educated on transfer safety and fall prevention       Therapist:   Janan Halter, OT     Total Time: 10 minutes  Charges Entered: OT eval low complexity  Department Number: [phone]

## 2024-01-16 NOTE — ED Nurses Note (Signed)
 Report called to 5th floor RN.

## 2024-01-17 LAB — BASIC METABOLIC PANEL
ANION GAP: 6 mmol/L (ref 5–19)
BUN/CREA RATIO: 16 (ref 6–20)
BUN: 13 mg/dL (ref 9–20)
CALCIUM: 8.2 mg/dL — ABNORMAL LOW (ref 8.4–10.2)
CHLORIDE: 112 mmol/L — ABNORMAL HIGH (ref 98–107)
CO2 TOTAL: 24 mmol/L (ref 22–30)
CREATININE: 0.83 mg/dL (ref 0.66–1.20)
ESTIMATED GFR: >60 mL/min/{1.73_m2} (ref >60)
GLUCOSE: 96 mg/dL (ref 74–106)
POTASSIUM: 3.9 mmol/L (ref 3.5–5.1)
SODIUM: 142 mmol/L (ref 137–145)

## 2024-01-17 LAB — CBC WITH DIFF
BASOPHIL #: 0.1 10*3/uL (ref 0.00–0.20)
BASOPHIL %: 1 % (ref 0–2)
EOSINOPHIL #: 0.1 10*3/uL (ref 0.00–0.60)
EOSINOPHIL %: 2 % (ref 0–5)
HCT: 37.6 % (ref 36.0–46.0)
HGB: 12.7 g/dL — ABNORMAL LOW (ref 13.9–16.3)
LYMPHOCYTE #: 0.9 10*3/uL — ABNORMAL LOW (ref 1.10–3.80)
LYMPHOCYTE %: 18 % — ABNORMAL LOW (ref 19–46)
MCH: 31.6 pg (ref 25.4–34.0)
MCHC: 33.9 g/dL (ref 30.0–37.0)
MCV: 93.2 fL (ref 80.0–100.0)
MONOCYTE #: 0.6 10*3/uL (ref 0.10–0.80)
MONOCYTE %: 12 % (ref 4–12)
MPV: 9.1 fL (ref 7.5–11.5)
NEUTROPHIL #: 3.4 10*3/uL (ref 1.80–7.50)
NEUTROPHIL %: 67 % (ref 41–69)
PLATELETS: 65 10*3/uL — ABNORMAL LOW (ref 130–400)
RBC: 4.03 10*6/uL — ABNORMAL LOW (ref 4.30–5.90)
RDW: 15.4 % — ABNORMAL HIGH (ref 11.5–14.0)
WBC: 5.1 10*3/uL (ref 4.5–11.5)

## 2024-01-17 LAB — SCAN DIFFERENTIAL
PLATELET ESTIMATE: DECREASED
RBC MORPHOLOGY COMMENT: NORMAL
WBC MORPHOLOGY COMMENT: NORMAL

## 2024-01-17 MED ORDER — MIRABEGRON ER 25 MG TABLET,EXTENDED RELEASE 24 HR
25.0000 mg | ORAL_TABLET | Freq: Every day | ORAL | Status: DC
Start: 2024-01-17 — End: 2024-01-20
  Administered 2024-01-17 – 2024-01-20 (×4): 25 mg via ORAL
  Filled 2024-01-17 (×4): qty 1

## 2024-01-17 NOTE — Care Plan (Signed)
Problem: Skin Injury Risk Increased  Goal: Skin Health and Integrity  Outcome: Ongoing (see interventions/notes)     Problem: Fall Injury Risk  Goal: Absence of Fall and Fall-Related Injury  Outcome: Ongoing (see interventions/notes)

## 2024-01-17 NOTE — Progress Notes (Signed)
NAME: Hector [name]  AGE: 63 y.o.  SEX: male  MRN: [id number]    DATE OF ADMISSION: 01/15/2024  PCP: Robin Searing, MD  ATTENDING: Cephus Shelling, DO     DEPARTMENT OF FAMILY MEDICINE  INPATIENT PROGRESS NOTE       Hospital Day 2     SUBJECTIVE     I evaluated and examined Hector [name] today. The patient is a 63 y.o. male who is being monitored due to hematuria in the setting of diagnosed bladder malignancy and pain upon urination. Patient has a urinary catheter at this time.    No acute events were reported overnight by nursing staff. Patient's urinary catheter bag demonstrates non-turbid slightly pink straw-colored urine. At this time, patient expressed that he only has pain when he micturates that he believes is well-managed. Patient has a history of COPD and requires 5 liters of oxygen at baseline; patient is on 3.5 L O2 at this time and is comfortable.    Review of Systems  Review of Systems   Constitutional:  Negative for appetite change, chills, fatigue and fever.   HENT: Negative.     Eyes: Negative.    Respiratory:  Negative for cough, chest tightness and wheezing.    Cardiovascular:  Negative for chest pain, palpitations and leg swelling.   Gastrointestinal:  Negative for abdominal pain, blood in stool, diarrhea, nausea and vomiting.   Endocrine: Negative.    Genitourinary:  Positive for dysuria, hematuria and penile pain. Negative for urgency.        Foley in place   Musculoskeletal:  Negative for back pain.   Skin: Negative.    Allergic/Immunologic: Negative.    Neurological:  Negative for dizziness, weakness, light-headedness and numbness.   Hematological: Negative.    Psychiatric/Behavioral: Negative.            MEDICATIONS       budesonide (PULMICORT RESPULES) 0.5 mg/2 mL nebulizer suspension, 0.5 mg, 2x/day **AND** arformoterol (BROVANA) 15 mcg/2 mL nebulizer solution, 15 mcg, 2x/day    [Held by provider] aspirin (ECOTRIN) enteric coated tablet 81 mg, 81 mg, Daily    cefTRIAXone (ROCEPHIN) 1 g in NS 100 mL  IVPB - DOCKED, 1 g, Q24H    [Held by provider] clopidogrel (PLAVIX) 75 mg tablet, 75 mg, Daily    mirabegron (MYRBETRIQ) 24 hr extended release tablet, 25 mg, Daily    NS flush syringe, 10 mL, Q8HRS    OLANZapine (zyPREXA) tablet, 10 mg, NIGHTLY    [Held by provider] oxyBUTYnin (DITROPAN) tablet, 5 mg, 3x/day    pantoprazole (PROTONIX) delayed release tablet, 40 mg, Daily    phenazopyridine (PYRIDIUM) tablet, 100 mg, 3x/day-Meals    polyethylene glycol (MIRALAX) oral packet, 17 g, Daily    rosuvastatin (CRESTOR) tablet, 10 mg, QPM    [Held by provider] tamsulosin (FLOMAX) capsule, 0.4 mg, Daily after Dinner    traZODone (DESYREL) tablet, 50 mg, NIGHTLY       OBJECTIVE     Vital Signs BP (!) 155/72   Pulse 62   Temp 36.6 C (97.9 F)   Resp 19   Ht 1.753 m (5\' 9" )   Wt 103 kg (227 lb 11.8 oz)   SpO2 91%   BMI 33.63 kg/m      Physical Exam Physical Exam  Vitals and nursing note reviewed.   Constitutional:       General: He is not in acute distress.     Appearance: He is well-developed and overweight. He is not  ill-appearing or toxic-appearing.   HENT:      Head: Normocephalic and atraumatic.      Right Ear: External ear normal.      Left Ear: External ear normal.      Mouth/Throat:      Pharynx: Oropharynx is clear.   Eyes:      General: No scleral icterus.     Conjunctiva/sclera: Conjunctivae normal.   Cardiovascular:      Rate and Rhythm: Normal rate and regular rhythm.      Pulses: Normal pulses.      Heart sounds: Normal heart sounds. No murmur heard.     No friction rub. No gallop.   Pulmonary:      Effort: Pulmonary effort is normal. No respiratory distress (mild at bilateral lower lung fields).      Breath sounds: Normal breath sounds. No stridor. No wheezing, rhonchi or rales.   Abdominal:      General: Abdomen is flat. Bowel sounds are normal. There is no distension.      Palpations: Abdomen is soft. There is no mass.      Tenderness: There is no abdominal tenderness. There is no guarding.       Hernia: No hernia is present.   Genitourinary:     Comments: Foley catheter bag contains amber urine with slight blood tinge.  Musculoskeletal:         General: No swelling.      Cervical back: Neck supple.   Skin:     General: Skin is warm and dry.      Capillary Refill: Capillary refill takes less than 2 seconds.      Coloration: Skin is not pale.   Neurological:      Mental Status: He is alert.   Psychiatric:         Mood and Affect: Mood normal.         Behavior: Behavior is cooperative.          Intake and Output   Intake/Output Summary (Last 24 hours) at 01/17/2024 1323  Last data filed at 01/17/2024 0837  Gross per 24 hour   Intake 9193.33 ml   Output 32440 ml   Net -1756.67 ml          Diagnostic Studies    CBC Recent Labs     01/15/24  1432 01/17/24  0747   WBC 3.5* 5.1   HGB 12.8* 12.7*   HCT 37.7 37.6     Differential Recent Labs     01/15/24  1432 01/17/24  0747   PMNS 56 67   LYMPHOCYTES 27 18*   MONOCYTES 13* 12   EOSINOPHIL 3 2   BASOPHILS 1  0.00 1  0.10   PMNABS 2.00 3.40   LYMPHSABS 1.00* 0.90*   MONOSABS 0.50 0.60   EOSABS 0.10 0.10             BMP Recent Labs     01/15/24  1432 01/17/24  0747   SODIUM 139 142   POTASSIUM 3.6 3.9   CHLORIDE 109* 112*   CO2 23 24   BUN 19 13   CREATININE 0.92 0.83   GFR >60 >60   ANIONGAP 7 6     Other Chemistry Recent Labs     01/15/24  1432 01/17/24  0747   CALCIUM 8.2* 8.2*   ALBUMIN 3.2*  --    MAGNESIUM 1.8  --  Liver and Pancreatic Enzymes Recent Labs     01/15/24  1432   TOTALPROTEIN 6.1*   ALBUMIN 3.2*   AST 47   ALT 33   ALKPHOS 92   LIPASE 118     Blood Gas No results found for this encounter          Cardiac No results for input(s): "UHCEASTTROPI", "CKMB", "MBINDEX", "BNP" in the last 72 hours.  Coags No results for input(s): "INR" in the last 72 hours.    Invalid input(s): "PTT", "PT"          Imaging CT ABDOMEN PELVIS WO IV CONTRAST    Result Date: 01/15/2024  Impression Foley catheter in a decompressed bladder. There is likely some  circumferential bladder wall thickening. Other findings as above. One or more dose reduction techniques were used (e.g., Automated exposure control, adjustment of the mA and/or kV according to patient size, use of iterative reconstruction technique). Radiologist location ID: [facility]    [facility] Encounter on 01/15/24 (from the past 96 hour(s))   URINE CULTURE,ROUTINE    Collection Time: 01/15/24  2:32 PM    Specimen: Urine, Site not specified   Culture Result Status    URINE CULTURE >100,000 Gram Negative Rods, Oxidase Negative (A) Preliminary   ADULT ROUTINE BLOOD CULTURE, SET OF 2 ADULT BOTTLES (BACTERIA AND YEAST)    Collection Time: 01/15/24  5:11 PM    Specimen: Blood   Culture Result Status    BLOOD CULTURE, ROUTINE No Growth 18-24 hrs. Preliminary   ADULT ROUTINE BLOOD CULTURE, SET OF 2 ADULT BOTTLES (BACTERIA AND YEAST)    Collection Time: 01/15/24  5:11 PM    Specimen: Blood   Culture Result Status    BLOOD CULTURE, ROUTINE No Growth 18-24 hrs. Preliminary           ASSESSMENT     Eryn Krejci is a 63 y.o. male admitted for the following hospital problems:    Active Hospital Problems    Diagnosis    Primary Problem: Hematuria    Acute cystitis without hematuria    Bladder tumor    Inability to perform activities of daily living          CARE PLAN     TODAY: We have discontinued patient's present continuous bladder irrigation. We will maintain the Foley catheter. Patient's ongoing functional deficit presents a need for further care with skilled outpatient care; we will plan for eventual discharge to skilled care in accordance with PT/OT recommendations.    Painful hematuria, with indwelling foley catheter, suspected bladder malignancy awaiting further Uro/Onc workup.   Inability to perform ADLs in the setting of above   Labs in ED demonstrated pancytopenia (WBC 3.5, Hgb 12.8, Plt 67k, at baseline) otherwise unremarkable, CMP largely unremarkable, Mag WNL, Lipase WNL, Lactate 0.6, ammonia  WNL  Today, WBC 5.1, Hgb 12.7, Plt 65k  Initial CT A/P WO IV Contrast: Foley catheter in a decompressed bladder, likely some circumferential bladder wall thickening   U/A notable for trace ketones, large blood and leukocytes, culture growth of oxidase-negative G- rods  Blood cultures drawn, NGTD  Continue IV ceftriaxone started Jan 31  Bladder irrigation discontinued; maintain Foley catheter  Hold Flomax 0.4 mg qPM and Hold Ditropan 5mg  TID   Continue Pyridium 100 mg TID; added mirabegron 25 mg daily due to persistent pain and bladder spasm per Urology recommendations  Bowel regimen  Pain Control:   Ofirmev 1000 mg q6h scheduled   Morphine 2 mg  q4h PRN for breakthrough pain   PT/OT/SS consulted     Other Chronic Conditions:  COPD with chronic hypoxemic respiratory failure  On baseline O2 5L NC   Home Advair substituted with formulary budesonide/brovana nebs BID   Continue home albuterol nebulizer q6h PRN   CAD  Home aspirin and Plavix currently held in case of possible procedure.   Anemia, suspect secondary to liver cirrhosis   Hgb on admission 12.8, baseline 12  Iron studies: Ferritin 24, Iron 47, Iron sat 15, TIBC 320   AM CBC   Anxiety and Depression   Continue home trazodone 50 mg nightly   Continue home Zyprexa 10 mg nightly   GERD   Continue home Protonix 40 mg daily   Hyperlipidemia   Continue home Crestor 10 mg qPM      Diet: DIET REGULAR  DVT/PE Prophylaxis: SCDs/ Venodynes/Impulse boots    Anticoagulants/Antiplatelets (last 24 hours)       None            Code Status: FULL CODE: ATTEMPT RESUSCITATION/CPR    Disposition: Skilled Nursing Facility in agreement with PT/OT recommendations          __________________________  Estrella Myrtle, DO  PGY-1, Family Medicine  [place]  [facility]    Documented on 01/17/2024 at 08:45.    I saw and examined the patient, reviewed the patient's medical history, the resident's findings on physical exam and the patient's diagnosis and treatment plan with the  resident.  I agree with the information documented.  Cephus Shelling, DO

## 2024-01-17 NOTE — Nurses Notes (Signed)
Per provider's order, stopped CBI at 1030. Urine in foley bag is tea-colored.

## 2024-01-17 NOTE — Care Plan (Signed)
Problem: Adult Inpatient Plan of Care  Goal: Plan of Care Review  Outcome: Ongoing (see interventions/notes)  Goal: Patient-Specific Goal (Individualized)  Outcome: Ongoing (see interventions/notes)  Goal: Absence of Hospital-Acquired Illness or Injury  Outcome: Ongoing (see interventions/notes)  Intervention: Identify and Manage Fall Risk  Recent Flowsheet Documentation  Taken 01/17/2024 0839 by Charmayne Sheer, RN  Safety Promotion/Fall Prevention:   activity supervised   nonskid shoes/slippers when out of bed   safety round/check completed  Intervention: Prevent Skin Injury  Recent Flowsheet Documentation  Taken 01/17/2024 0839 by Charmayne Sheer, RN  Body Position: supine  Skin Protection: adhesive use limited  Intervention: Prevent and Manage VTE (Venous Thromboembolism) Risk  Recent Flowsheet Documentation  Taken 01/17/2024 0839 by Charmayne Sheer, RN  VTE Prevention/Management: sequential compression devices on  Intervention: Prevent Infection  Recent Flowsheet Documentation  Taken 01/17/2024 0839 by Charmayne Sheer, RN  Infection Prevention: rest/sleep promoted  Goal: Optimal Comfort and Wellbeing  Outcome: Ongoing (see interventions/notes)  Intervention: Provide Person-Centered Care  Recent Flowsheet Documentation  Taken 01/17/2024 0839 by Charmayne Sheer, RN  Trust Relationship/Rapport:   care explained   choices provided   questions answered  Goal: Rounds/Family Conference  Outcome: Ongoing (see interventions/notes)     Problem: Skin Injury Risk Increased  Goal: Skin Health and Integrity  Outcome: Ongoing (see interventions/notes)  Intervention: Optimize Skin Protection  Recent Flowsheet Documentation  Taken 01/17/2024 0839 by Charmayne Sheer, RN  Pressure Reduction Techniques: Frequent weight shifting encouraged  Pressure Reduction Devices: Repositioning wedges/pillows utilized  Skin Protection: adhesive use limited  Activity Management: bedrest  Head of Bed (HOB) Positioning: HOB at 45 degrees     Problem: Fall Injury Risk  Goal: Absence of Fall  and Fall-Related Injury  Outcome: Ongoing (see interventions/notes)  Intervention: Identify and Manage Contributors  Recent Flowsheet Documentation  Taken 01/17/2024 0839 by Charmayne Sheer, RN  Self-Care Promotion:   independence encouraged   BADL personal objects within reach  Medication Review/Management: medications reviewed  Intervention: Promote Injury-Free Environment  Recent Flowsheet Documentation  Taken 01/17/2024 5409 by Charmayne Sheer, RN  Safety Promotion/Fall Prevention:   activity supervised   nonskid shoes/slippers when out of bed   safety round/check completed

## 2024-01-18 DIAGNOSIS — J9611 Chronic respiratory failure with hypoxia: Secondary | ICD-10-CM

## 2024-01-18 LAB — BASIC METABOLIC PANEL
ANION GAP: 6 mmol/L (ref 5–19)
BUN/CREA RATIO: 14 (ref 6–20)
BUN: 11 mg/dL (ref 9–20)
CALCIUM: 8.1 mg/dL — ABNORMAL LOW (ref 8.4–10.2)
CHLORIDE: 108 mmol/L — ABNORMAL HIGH (ref 98–107)
CO2 TOTAL: 25 mmol/L (ref 22–30)
CREATININE: 0.79 mg/dL (ref 0.66–1.20)
ESTIMATED GFR: >60 mL/min/{1.73_m2} (ref >60)
GLUCOSE: 96 mg/dL (ref 74–106)
POTASSIUM: 4 mmol/L (ref 3.5–5.1)
SODIUM: 139 mmol/L (ref 137–145)

## 2024-01-18 LAB — CBC
HCT: 35 % — ABNORMAL LOW (ref 36.0–46.0)
HGB: 11.9 g/dL — ABNORMAL LOW (ref 13.9–16.3)
MCH: 31.4 pg (ref 25.4–34.0)
MCHC: 33.9 g/dL (ref 30.0–37.0)
MCV: 92.7 fL (ref 80.0–100.0)
MPV: 8.5 fL (ref 7.5–11.5)
PLATELETS: 54 10*3/uL — ABNORMAL LOW (ref 130–400)
RBC: 3.78 10*6/uL — ABNORMAL LOW (ref 4.30–5.90)
RDW: 15.2 % — ABNORMAL HIGH (ref 11.5–14.0)
WBC: 5.4 10*3/uL (ref 4.5–11.5)

## 2024-01-18 LAB — H & H
HCT: 35.6 % — ABNORMAL LOW (ref 36.0–46.0)
HCT: 34.7 % — ABNORMAL LOW (ref 36.0–46.0)
HGB: 11.9 g/dL — ABNORMAL LOW (ref 13.9–16.3)
HGB: 11.8 g/dL — ABNORMAL LOW (ref 13.9–16.3)

## 2024-01-18 LAB — GOLD TOP TUBE

## 2024-01-18 MED ORDER — ACETAMINOPHEN 1,000 MG/100 ML (10 MG/ML) INTRAVENOUS SOLUTION
1000.0000 mg | Freq: Four times a day (QID) | INTRAVENOUS | Status: AC
Start: 2024-01-18 — End: 2024-01-19
  Administered 2024-01-18 (×2): 0 mg via INTRAVENOUS
  Administered 2024-01-18: 1000 mg via INTRAVENOUS
  Administered 2024-01-18: 0 mg via INTRAVENOUS
  Administered 2024-01-18 (×2): 1000 mg via INTRAVENOUS
  Administered 2024-01-19: 0 mg via INTRAVENOUS
  Administered 2024-01-19: 1000 mg via INTRAVENOUS
  Filled 2024-01-18 (×4): qty 100

## 2024-01-18 NOTE — Progress Notes (Signed)
[facility] Medicine [facility]  Department of Family Medicine  Inpatient Progress Note      Name: Hector [name]  Age: 63 y.o.   Gender: male  MRN: [id number]  Date of Admission:  01/15/2024  PCP: Robin Searing, MD  Attending: Cephus Shelling, DO  Consultants:   Consult Orders Placed This Encounter   Procedures    IP CONSULT TO UROLOGY Requested Provider; Idamae Lusher, Lisa Roca    IP CONSULT TO SOCIAL WORK       SUBJECTIVE:      Hospital day 3    Patient seen and evaluated at bedside. Patient had episode of frank hematuria overnight and CBI was re-started. Hgb was 11.9, down from 12.7 (BL 12). Patient has not had a bowel movement in two days. He is passing gas and does not feel bloated or constipated. He has Miralax on his bowel regimen. Patient reports good appetite. He states he is still experiencing bladder spasms with passing clots and feels the myrbetriq is helping, but not completely resolving. Will re-schedule Ofirmev for pain. POC discussed with patient at bedside.    Review of Systems:  All other ROS negative unless stated in HPI    OBJECTIVE     BP (!) 148/82   Pulse 75   Temp 37.3 C (99.1 F)   Resp 18   Ht 1.753 m (5\' 9" )   Wt 103 kg (227 lb 11.8 oz)   SpO2 91%   BMI 33.63 kg/m     PHYSICAL EXAM  General: appears chronically ill, mildly obese, appears older than stated age, and no distress. Foley catheter and CBI in place draining straw yellow urine with tinge of red  Eyes: Conjunctiva clear., Pupils equal and round.   HENT:Head atraumatic and normocephalic, Nose without erythema. , Mouth mucous membranes moist.   Neck: supple, symmetrical, trachea midline  Lungs: Breathing nonlabored, Clear to auscultation bilaterally.   Cardiovascular: regular rate and rhythm, S1, S2 normal, no murmur, click, rub or gallop  Abdomen: non-distended, Bowel sounds normal  Extremities: extremities normal, atraumatic, no cyanosis or edema  Skin: Skin warm and dry  Psychiatric: Normal affect, behavior, memory, thought  content, judgement, and speech.    Medications:    budesonide (PULMICORT RESPULES) 0.5 mg/2 mL nebulizer suspension, 0.5 mg, 2x/day **AND** arformoterol (BROVANA) 15 mcg/2 mL nebulizer solution, 15 mcg, 2x/day    [Held by provider] aspirin (ECOTRIN) enteric coated tablet 81 mg, 81 mg, Daily    cefTRIAXone (ROCEPHIN) 1 g in NS 100 mL IVPB - DOCKED, 1 g, Q24H    [Held by provider] clopidogrel (PLAVIX) 75 mg tablet, 75 mg, Daily    mirabegron (MYRBETRIQ) 24 hr extended release tablet, 25 mg, Daily    NS flush syringe, 10 mL, Q8HRS    OLANZapine (zyPREXA) tablet, 10 mg, NIGHTLY    [Held by provider] oxyBUTYnin (DITROPAN) tablet, 5 mg, 3x/day    pantoprazole (PROTONIX) delayed release tablet, 40 mg, Daily    phenazopyridine (PYRIDIUM) tablet, 100 mg, 3x/day-Meals    polyethylene glycol (MIRALAX) oral packet, 17 g, Daily    rosuvastatin (CRESTOR) tablet, 10 mg, QPM    [Held by provider] tamsulosin (FLOMAX) capsule, 0.4 mg, Daily after Dinner    traZODone (DESYREL) tablet, 50 mg, NIGHTLY    Labs:  CBC:     5.1 (02/02 0747) \   11.9* (02/03 0045) /   65* (02/02 0747)      / 35.6* (02/03 0045) \      BMP:  142 (02/02 0747) 112* (02/02 1191) 13 (02/02 4782)    /     96 (02/02 0747)   3.9 (02/02 0747) 24 (02/02 0747) 0.83 (02/02 0747) \         In's and Out's:    Intake/Output Summary (Last 24 hours) at 01/18/2024 0624  Last data filed at 01/17/2024 2140  Gross per 24 hour   Intake 100 ml   Output 2600 ml   Net -2500 ml     Radiology Results:  No results found.    Microbiology:  Hospital Encounter on 01/15/24 (from the past 96 hour(s))   URINE CULTURE,ROUTINE    Collection Time: 01/15/24  2:32 PM    Specimen: Urine, Site not specified   Culture Result Status    URINE CULTURE >100,000 Gram Negative Rods, Oxidase Negative (A) Preliminary   ADULT ROUTINE BLOOD CULTURE, SET OF 2 ADULT BOTTLES (BACTERIA AND YEAST)    Collection Time: 01/15/24  5:11 PM    Specimen: Blood   Culture Result Status    BLOOD CULTURE, ROUTINE No Growth 2  Days Preliminary   ADULT ROUTINE BLOOD CULTURE, SET OF 2 ADULT BOTTLES (BACTERIA AND YEAST)    Collection Time: 01/15/24  5:11 PM    Specimen: Blood   Culture Result Status    BLOOD CULTURE, ROUTINE No Growth 2 Days Preliminary     ASSESSMENT     Hector [name] is a 63 y.o. year-old male admitted for  Active Hospital Problems    Diagnosis    Primary Problem: Hematuria    Acute cystitis without hematuria    Bladder tumor    Inability to perform activities of daily living     PLAN   Painful hematuria, with indwelling foley catheter, suspected bladder malignancy awaiting further Uro/Onc workup.   Inability to perform ADLs in the setting of above   Acute Complicated UTI, suspect E. Coli vs Enterobacteria   CBC revealed drop in Hgb 11.9, stabilized  Will repeat H&H at 16:00  Initial CT A/P WO IV Contrast: Foley catheter in a decompressed bladder, likely some circumferential bladder wall thickening   Blood cultures drawn, NGTD x 2 days  U/A notable for trace ketones, large blood and leukocytes, culture growth of oxidase-negative G- rods, awaiting speciation   and sensitivity  Continue IV ceftriaxone started Jan 31  Continue mirabegron 25 mg daily due to persistent pain and bladder spasm per Urology recommendations  Discontinue pyridium TID, CBI  Bowel regimen- Miralax daily   Hold Flomax 0.4 mg qPM, Ditropan 5mg  TID   Maintain Foley catheter  Pain Control:   Ofirmev 1000 mg q6h scheduled   Morphine 2 mg q4h PRN for breakthrough pain   PT/OT/SS consulted  Referral made to facility in [place]      Other Chronic Conditions:  COPD with chronic hypoxemic respiratory failure  On baseline O2 5L NC   Home Advair substituted with formulary budesonide/brovana nebs BID   Continue home albuterol nebulizer q6h PRN   Duonebs TID prn  CAD  Home aspirin and Plavix currently held in case of possible procedure.   Anemia, suspect secondary to liver cirrhosis   Hgb 11.9, baseline 12  Will repeat H&H at 16:00  Iron studies: Ferritin 24,  Iron 47, Iron sat 15, TIBC 320   AM CBC   Anxiety and Depression   Continue home trazodone 50 mg nightly   Continue home Zyprexa 10 mg nightly   GERD   Continue home Protonix 40 mg  daily   Hyperlipidemia   Continue home Crestor 10 mg qPM       Diet: DIET REGULAR  DVT/PE: SCDs/ Venodynes/Impulse boots   Code Status: FULL CODE: ATTEMPT RESUSCITATION/CPR    Disposition: Skilled Nursing Facility-awaiting referral     Willeen Cass, DO   01/18/2024, 06:24   Family Medicine Resident, PGY-1  [facility] Medicine [facility]    I saw and examined the patient, reviewed the patient's medical history, the resident's findings on physical exam and the patient's diagnosis and treatment plan with the resident.  I agree with the information documented.  Cephus Shelling, DO

## 2024-01-18 NOTE — Care Management Notes (Addendum)
[facility]  Care Management Initial Evaluation    Patient Name: Patricio Popwell  Date of Birth: 10/11/1961  Sex: male  Date/Time of Admission: 01/15/2024 12:46 PM  Room/Bed: 527/B  Payor: HEALTH PLAN MEDICAID / Plan: HEALTH PLAN MEDICAID / Product Type: Medicaid [place] /   Primary Care Providers:  Robin Searing, MD, MD (General)    Pharmacy Info:   Preferred Pharmacy       [facility] [phone] - [address] [facility] - [address]    [address] [place] [address]    Phone: [phone] Fax: [phone]    Hours: Not open 24 hours          Emergency Contact Info:   Extended Emergency Contact Information  Primary Emergency Contact: HUGHES,[name]  Address: [address]           [place], [place] [address] [place]  Home Phone: [phone]  Work Phone: [phone]  Mobile Phone: [phone]  Relation: Significant other  Preferred language: English  Interpreter needed? No    History:   Melquan Ernsberger is a 63 y.o., male, admitted     Height/Weight: 175.3 cm (5\' 9" ) / 103 kg (227 lb 11.8 oz)     LOS: 3 days   Admitting Diagnosis: Failure to thrive in adult [R62.7]    Assessment: SS consult for a DC plan.  SS met with patient today.  He reports that he lives in a house with his significant other.  There are 2 steps to enter and 13 inside but he has a stair lift.  Patient has a cane and standard walker.  Said he could put wheels on the walker if needed.  He wears 5L O2 at home provided by [facility].  His SO does the grocery shopping and takes him to appointments.  She also does the cooking.  Patient wants to go to a SNF to get therapy before going home.  He refused a list of facilities.  SS named facilities in the area and he said to try [place].  Ref has been made.      Karlyne Greenspan has several concerns:  Zyprexa and patient going for f/u in [place].  They do not have transportation available to f/u appointments and/or treatments.      Provided patient with listing with CMS ratings for Skilled Nursing  Facility  in 30 mile radius of [address] zip code.    SS f/u with patient and his SO.  Patient just wants to go home.  SS asked about HH and patient and SO said they would discuss it.  He said to have the CM come talk with him tomorrow about it.      Discharge Plan:  Home      The patient will continue to be evaluated for developing discharge needs.     Case Manager: Theodoro Kos, SOCIAL WORKER  Phone:

## 2024-01-18 NOTE — Nurses Notes (Addendum)
Patient urine in his foley is looking more bloody than orange this evening. Patient denies any increased pains, but is still having bladder spasms tonight. Residents were called and notified. They came to assess patient and stated that it is definitely more bloody than before. They ordered to restart CBI tonight. CBI restarted, urine immediately started running more clear to a yellow color with a few small clots. Patient given IV morphine for flank pain, and he appears to be resting comfortably. Hgb was redrawn, and it shows 11.9.            Jeannine Kitten, RN

## 2024-01-18 NOTE — Ancillary Notes (Cosign Needed)
Contacted by patient's nurse due to frank hematuria per catheter bag.    Patient was encountered at bedside by writer and supervising colleague Dr Park Breed. Patient expressed no complaints in terms of alteration of sensorium or new constitutional symptoms. Patient noted that his bladder spasms have been improved but still present.        Patient's catheter bag was examined and was noted to have a moderate amount of darker blood-tinged urine as compared to during the daytime, at which time patient was still receiving continuous bladder irrigation.    Irrigation has been reinstated for the time being. Daytime care team may assess appropriateness to discontinue CBI.    As the covering nighttime residents, we will continue to monitor patient closely for progress / clinical changes.        __________________________  Estrella Myrtle, DO  PGY-1, Family Medicine  [place]  [facility]    Documented on 01/18/2024 at 00:36.

## 2024-01-18 NOTE — PT Treatment (Signed)
[facility]   Acute Care Physical Therapy  Medical [address]  [place], [place] [address]  [phone]         Physical Therapy Acute Care Daily Treatment Record       Date: 01/18/2024  Time: 09:31   Patient's Name: Hector [name]  Date of Birth: [birth year]  Room Number: 527/B    Pertinent Information since last Physical Therapy visit :    No significant change since last session.      Precautions / Limitations :    Fall precautions     Weight Bearing status, if applicable :    No Weightbearing restrictions     Subjective :   Patient reports fatigue today but is agreeable to participate with therapy.     Pain Rating :   Pre-Treatment Pain: 3  Post-Treatment Pain: 3  Comments:                                  Orientation & Level of Orientation :    Alert, Cooperative, and Oriented Person, Place, and Time      6 Clicks Score :    1.) Turning from back to side while flat in bed without using a bed rail.   A little assistance (3)  2.) Moving from lying on back to sitting on the side of a flat bed without using bed rails.  A little assistance (3)  3.) Moving to and from a bed to a chair, including a wheelchair.   A little assistance (3)  4.) Standing up for chair using your arms (eg, wheelchair or bedside chair).  A little assistance (3)  5.) Walking in hospital room.  A lot of assistance (2)  6.) Climbing 3-5 steps with a railing.   A lot of assistance (2)    6 Clicks Raw Score: 16    6 Clicks Plan: Score <18 - PT consult is appropriate; placement may be necessary at discharge      Treatment :      Transfers:  Rolling in bed:    Minimal Assist  with assist of 1                                                Supine to sit:   Minimal Assist   with assist of 1                                Sit to stand:  Minimal Assist   with assist of 1                                                                Gait:     Distance:    40'              Device Utilized:   Rollator Walker          Level of  Assist: Minimal Assist  with assist of 1      Weight-Bearing Status:   No Weightbearing restrictions                        Gait Deviations: Step length decreased           Other Treatment Interventions:       Daily Assessment of Status :    Patient performed supine to sit transfer x min A x 1 and sit to stand x min A x 1. Patient ambulated 40 feet with rollator x min A x 1. Patient demonstrates increased endurance and balance with gait.       Timed Charge Number of Minutes   Neuromuscular Re-Education     Gait Training  11   Therapeutic Activity     Therapeutic Exercise      Canalith Re-positioning   Non-timed charge    Other     Total Treatment Time  11     Patient Education :   Educated patient on proper gait sequence with device         Interdisciplinary Communication :    Discussed patient care with Occupational Therapy             Discharge Planning :   Skilled Care  -  Based on current diagnosis, functional performance prior to admission and current functional presentation, this patient requires continued Physical Therapy services in a skilled care facility in order to achieve significant functional improvements in one or more of these deficit areas:  aerobic capacity/endurance; gait, locomotion and balance; motor function; muscle performance; posture; range of motion    Plan :   Continue Physical Therapy program working on stated goals for continued functional improvement     Becky Augusta, PT, DPT  01/18/2024, 12:57

## 2024-01-18 NOTE — Care Plan (Signed)
 Problem: Fall Injury Risk  Goal: Absence of Fall and Fall-Related Injury  Outcome: Ongoing (see interventions/notes)     Problem: Skin Injury Risk Increased  Goal: Skin Health and Integrity  Outcome: Ongoing (see interventions/notes)

## 2024-01-18 NOTE — OT Treatment (Signed)
[facility]  Rehabilitation Services  Occupational Therapy Progress Note    Patient Name: Hector [name]  Date of Birth: [birth year]  Height:  175.3 cm (5\' 9" )  Weight:  103 kg (227 lb 11.8 oz)  Room/Bed: 527/B  Payor: HEALTH PLAN MEDICAID / Plan: HEALTH PLAN MEDICAID / Product Type: Medicaid MC /     Assessment:       01/18/24 0934   Therapist Pager   OT Assigned/ Pager # [phone], Michaell Cowing COTA/L, CLT   Daily Activity AM-PAC/6-clicks Score   Putting on/Taking off clothing on lower body 2   Bathing 2   Toileting 2   Putting on/Taking off clothing on upper body 3   Personal grooming 3   Eating Meals 3   Raw Score Total 15   Standardized (t-scale) Score 34.69   CMS 0-100% Score 56.46   CMS Modifier CK           Discharge Needs:   Equipment Recommendation:TBD    Discharge Disposition: Based on diagnosis and level of independence with I/ADLs and mobility prior to admission vs current functional presentation/deficits, this patient requires continued OT services in a skilled care facility in order to achieve significant functional improvements in one or more of these deficit areas: self care; IADL tasks, functional mobility; neuro/motor function; balance; endurance; range of motion, cognition      Plan:   Continue to follow patient according to established plan of care.  The risks/benefits of therapy have been discussed with the patient/caregiver and he/she is in agreement with the established plan of care.     Subjective & Objective:     Pt seen this day with clearance from nursing, pt was supine in bed alert and willing to participate in therapy. Pt was sup for bed mobility, displayed fair sitting balance at edge of bed, CGA for sit to stand and maintain with DME for chair transfer, pt remained up in chair with all needs met upon OT exit from room.    Pain:  none reported    Level of consciousness alert    Orientation x3      Patient education transfer safety            Therapist:   Michaell Cowing, COTA     Start Time:  [phone]  End Time: 0945  Total Treatment Time: 11 minutes  Charges Entered:  therapeutic activity  Department Number: [phone]

## 2024-01-18 NOTE — Care Plan (Signed)
Problem: Adult Inpatient Plan of Care  Goal: Plan of Care Review  Outcome: Ongoing (see interventions/notes)  Goal: Patient-Specific Goal (Individualized)  Outcome: Ongoing (see interventions/notes)  Goal: Absence of Hospital-Acquired Illness or Injury  Outcome: Ongoing (see interventions/notes)  Intervention: Identify and Manage Fall Risk  Recent Flowsheet Documentation  Taken 01/18/2024 0753 by Charmayne Sheer, RN  Safety Promotion/Fall Prevention:   activity supervised   nonskid shoes/slippers when out of bed   safety round/check completed  Intervention: Prevent Skin Injury  Recent Flowsheet Documentation  Taken 01/18/2024 0753 by Charmayne Sheer, RN  Body Position: supine, head elevated  Skin Protection: adhesive use limited  Intervention: Prevent and Manage VTE (Venous Thromboembolism) Risk  Recent Flowsheet Documentation  Taken 01/18/2024 0753 by Charmayne Sheer, RN  VTE Prevention/Management: sequential compression devices on  Intervention: Prevent Infection  Recent Flowsheet Documentation  Taken 01/18/2024 0753 by Charmayne Sheer, RN  Infection Prevention:   rest/sleep promoted   promote handwashing  Goal: Optimal Comfort and Wellbeing  Outcome: Ongoing (see interventions/notes)  Intervention: Provide Person-Centered Care  Recent Flowsheet Documentation  Taken 01/18/2024 0753 by Charmayne Sheer, RN  Trust Relationship/Rapport:   care explained   choices provided   questions answered  Goal: Rounds/Family Conference  Outcome: Ongoing (see interventions/notes)     Problem: Skin Injury Risk Increased  Goal: Skin Health and Integrity  Outcome: Ongoing (see interventions/notes)  Intervention: Optimize Skin Protection  Recent Flowsheet Documentation  Taken 01/18/2024 0753 by Charmayne Sheer, RN  Pressure Reduction Techniques: Frequent weight shifting encouraged  Pressure Reduction Devices: Repositioning wedges/pillows utilized  Skin Protection: adhesive use limited  Activity Management: ROM, active encouraged  Head of Bed (HOB) Positioning: HOB at 30-45  degrees  Intervention: Promote and Optimize Oral Intake  Recent Flowsheet Documentation  Taken 01/18/2024 0753 by Charmayne Sheer, RN  Oral Nutrition Promotion: rest periods promoted     Problem: Fall Injury Risk  Goal: Absence of Fall and Fall-Related Injury  Outcome: Ongoing (see interventions/notes)  Intervention: Identify and Manage Contributors  Recent Flowsheet Documentation  Taken 01/18/2024 0753 by Charmayne Sheer, RN  Self-Care Promotion:   independence encouraged   BADL personal objects within reach  Medication Review/Management: medications reviewed  Intervention: Promote Injury-Free Environment  Recent Flowsheet Documentation  Taken 01/18/2024 0753 by Charmayne Sheer, RN  Safety Promotion/Fall Prevention:   activity supervised   nonskid shoes/slippers when out of bed   safety round/check completed

## 2024-01-19 LAB — CBC
HCT: 35 % — ABNORMAL LOW (ref 36.0–46.0)
HGB: 11.7 g/dL — ABNORMAL LOW (ref 13.9–16.3)
MCH: 31.1 pg (ref 25.4–34.0)
MCHC: 33.5 g/dL (ref 30.0–37.0)
MCV: 92.8 fL (ref 80.0–100.0)
MPV: 8.7 fL (ref 7.5–11.5)
PLATELETS: 57 10*3/uL — ABNORMAL LOW (ref 130–400)
RBC: 3.77 10*6/uL — ABNORMAL LOW (ref 4.30–5.90)
RDW: 15.2 % — ABNORMAL HIGH (ref 11.5–14.0)
WBC: 4.2 10*3/uL — ABNORMAL LOW (ref 4.5–11.5)

## 2024-01-19 LAB — URINE CULTURE,ROUTINE: URINE CULTURE: >100000 — AB

## 2024-01-19 LAB — GOLD TOP TUBE

## 2024-01-19 MED ORDER — ACETAMINOPHEN 1,000 MG/100 ML (10 MG/ML) INTRAVENOUS SOLUTION
1000.0000 mg | Freq: Four times a day (QID) | INTRAVENOUS | Status: AC
Start: 2024-01-19 — End: 2024-01-20
  Administered 2024-01-19 (×2): 1000 mg via INTRAVENOUS
  Administered 2024-01-19 (×2): 0 mg via INTRAVENOUS
  Administered 2024-01-19: 1000 mg via INTRAVENOUS
  Administered 2024-01-19: 0 mg via INTRAVENOUS
  Administered 2024-01-20: 1000 mg via INTRAVENOUS
  Administered 2024-01-20: 0 mg via INTRAVENOUS
  Filled 2024-01-19 (×4): qty 100

## 2024-01-19 NOTE — Progress Notes (Signed)
[facility]  Department of Family Medicine  Inpatient Progress Note      Name: Hector [name]  Age: 63 y.o.   Gender: male  MRN: [id number]  Date of Admission:  01/15/2024  PCP: Robin Searing, MD  Attending: Cephus Shelling, DO  Consultants:   Consult Orders Placed This Encounter   Procedures    IP CONSULT TO UROLOGY Requested Provider; Idamae Lusher, Lisa Roca    IP CONSULT TO SOCIAL WORK       SUBJECTIVE:      Hospital day 4    Patient seen and evaluated at bedside. No acute events reported. Discussed POC with SW and CM. Patient refused local placement and SNF will be unable to transport patient to [facility] for his appt with urology oncology. Patient is interested in going home with home health as he feels he would be more comfortable at home. SW and CM to discuss with him today.     Patient reports he had two, soft formed bowel movements yesterday. He reports his bladder spasm pain is controlled with current regimen. Dr. Elroy Channel discussed POC at bedside with patient.    Review of Systems:  All other ROS negative unless stated in HPI    OBJECTIVE     BP 139/64   Pulse 60   Temp 36.4 C (97.6 F)   Resp 18   Ht 1.753 m (5\' 9" )   Wt 103 kg (227 lb 11.8 oz)   SpO2 91%   BMI 33.63 kg/m     PHYSICAL EXAM  General: appears chronically ill, mildly obese, appears older than stated age, and no distress. Foley catheter and CBI in place draining straw yellow urine with tinge of red  Eyes: Conjunctiva clear, Pupils equal and round.   HENT:Head atraumatic and normocephalic, Nose without erythema, Mouth mucous membranes moist.   Neck: supple, symmetrical, trachea midline  Lungs: Breathing nonlabored, Clear to auscultation bilaterally.   Cardiovascular: regular rate and rhythm, S1, S2 normal, no murmur, click, rub or gallop  Abdomen: non-distended, Bowel sounds normal  Extremities: extremities normal, atraumatic, no cyanosis or edema  Skin: Skin warm and dry  Psychiatric: Normal affect, behavior, memory,  thought content, judgement, and speech.    Medications:    budesonide (PULMICORT RESPULES) 0.5 mg/2 mL nebulizer suspension, 0.5 mg, 2x/day **AND** arformoterol (BROVANA) 15 mcg/2 mL nebulizer solution, 15 mcg, 2x/day    [Held by provider] aspirin (ECOTRIN) enteric coated tablet 81 mg, 81 mg, Daily    cefTRIAXone (ROCEPHIN) 1 g in NS 100 mL IVPB - DOCKED, 1 g, Q24H    [Held by provider] clopidogrel (PLAVIX) 75 mg tablet, 75 mg, Daily    mirabegron (MYRBETRIQ) 24 hr extended release tablet, 25 mg, Daily    NS flush syringe, 10 mL, Q8HRS    OLANZapine (zyPREXA) tablet, 10 mg, NIGHTLY    [Held by provider] oxyBUTYnin (DITROPAN) tablet, 5 mg, 3x/day    pantoprazole (PROTONIX) delayed release tablet, 40 mg, Daily    polyethylene glycol (MIRALAX) oral packet, 17 g, Daily    rosuvastatin (CRESTOR) tablet, 10 mg, QPM    [Held by provider] tamsulosin (FLOMAX) capsule, 0.4 mg, Daily after Dinner    traZODone (DESYREL) tablet, 50 mg, NIGHTLY    Labs:  CBC:     5.4 (02/03 0708) \   11.8* (02/03 1624) /   54* (02/03 5784)      / 34.7* (02/03 1624) \      BMP:   139 (02/03 0708)  108* (02/03 6440) 11 (02/03 3474)    /     96 (02/03 2595)   4.0 (02/03 0708) 25 (02/03 0708) 0.79 (02/03 0708) \         In's and Out's:    Intake/Output Summary (Last 24 hours) at 01/19/2024 6387  Last data filed at 01/19/2024 0326  Gross per 24 hour   Intake 500 ml   Output --   Net 500 ml     Radiology Results:  No results found.    Microbiology:  Hospital Encounter on 01/15/24 (from the past 96 hour(s))   URINE CULTURE,ROUTINE    Collection Time: 01/15/24  2:32 PM    Specimen: Urine, Site not specified   Culture Result Status    URINE CULTURE >100,000 Gram Negative Rods, Oxidase Negative (A) Preliminary   ADULT ROUTINE BLOOD CULTURE, SET OF 2 ADULT BOTTLES (BACTERIA AND YEAST)    Collection Time: 01/15/24  5:11 PM    Specimen: Blood   Culture Result Status    BLOOD CULTURE, ROUTINE No Growth 3 Days Preliminary   ADULT ROUTINE BLOOD CULTURE, SET OF 2  ADULT BOTTLES (BACTERIA AND YEAST)    Collection Time: 01/15/24  5:11 PM    Specimen: Blood   Culture Result Status    BLOOD CULTURE, ROUTINE No Growth 3 Days Preliminary       ASSESSMENT     Hector [name] is a 63 y.o. year-old male admitted for  Active Hospital Problems    Diagnosis    Primary Problem: Hematuria    Chronic hypoxemic respiratory failure (CMS HCC)    Acute cystitis without hematuria    Bladder tumor    Inability to perform activities of daily living       PLAN   Painful hematuria, with indwelling foley catheter, suspected bladder malignancy awaiting further Uro/Onc workup (pain resolved).   Inability to perform ADLs in the setting of above   Urinary Tract Infection, suspect E. Coli vs Enterobacteria   CBC revealed drop in Hgb 11.7, stabile  Initial CT A/P WO IV Contrast: Foley catheter in a decompressed bladder, likely some circumferential bladder wall thickening   Blood cultures drawn, NGTD x 3 days  U/A notable for trace ketones, large blood and leukocytes, culture growth of oxidase-negative G- rods, awaiting speciation and sensitivity  Continue IV ceftriaxone started Jan 31 (Day 5)- will need 14 days total of abx on DC  Continue mirabegron 25 mg daily due to persistent pain and bladder spasm per Urology recommendations  Discontinue pyridium TID, CBI  Bowel regimen- Miralax daily   Hold Flomax 0.4 mg qPM, Ditropan 5mg  TID   Maintain Foley catheter  Pain Control:   Ofirmev 1000 mg q6h scheduled   Morphine 2 mg q4h PRN for breakthrough pain   PT/OT/SS consulted  CM to follow up with patient today to review discussion     Other Chronic Conditions:  COPD with chronic hypoxemic respiratory failure  On baseline O2 5L NC   Home Advair substituted with formulary budesonide/brovana nebs BID   Continue home albuterol nebulizer q6h PRN   Duonebs TID prn  CAD  Home aspirin and Plavix currently held in case of possible procedure.   Anemia, suspect secondary to liver cirrhosis   Hgb 11.7, baseline 12  Iron  studies: Ferritin 24, Iron 47, Iron sat 15, TIBC 320   AM CBC   Anxiety and Depression   Continue home trazodone 50 mg nightly   Continue home Zyprexa 10 mg  nightly   GERD   Continue home Protonix 40 mg daily   Hyperlipidemia   Continue home Crestor 10 mg qPM    Diet: DIET REGULAR  DVT/PE: SCDs/ Venodynes/Impulse boots   Code Status: FULL CODE: ATTEMPT RESUSCITATION/CPR    Disposition: Skilled Nursing Facility vs home with home health    Willeen Cass, DO   01/19/2024, 06:29   Family Medicine Resident, PGY-1  [facility]     I saw and examined the patient, reviewed the patient's medical history, the resident's findings on physical exam and the patient's diagnosis and treatment plan with the resident.  I agree with the information documented.  Cephus Shelling, DO

## 2024-01-19 NOTE — Discharge Instructions (Signed)
Discharge Recommendations/ Plan:Discharge YN:WGNF with Home Health (code 6)      Resources: Patient set up with [facility] home health for skilled nursing and PT.  They will see you on Monday, Feb 10th for first initial appointment.  Please call [phone] with any questions or concerns.  Thank you.      Discharge Recommendations/ Plan:Discharge IO:NGEX (Patient/Family Member/other) (code 1)      Resources: Patient is set up with Transportation through Health plan.  Trip # [id number] arranged 01/19/24 for appointment on the 17th of February.  They will pick him up at 9:30 and have them there by 11am and will bring him home when appointment completed.  Patient is aware that they will pick him up at his house and will call first.

## 2024-01-19 NOTE — Care Management Notes (Signed)
01/19/24 1338   Assessment Details   Assessment Type Continued Assessment   Date of Care Management Update 01/19/24   Date of Next DCP Update 01/21/24   Insurance Information/Type   Insurance type Medicaid   Employment/Financial   Patient has Prescription Coverage?  Yes        Name of Insurance Coverage for Medications Health plan   Financial/Environmental Concerns none   Living Environment   Select an age group to open "lives with" row.  Adult   Lives With significant other   Living Arrangements house   Able to Return to Prior Arrangements yes   Care Management Plan   Discharge Planning Status plan in progress   Projected Discharge Date 01/20/24   Discharge plan discussed with: Patient   CM will evaluate for rehabilitation potential yes   Patient choice offered to patient/family Yes   Form for patient choice reviewed/signed and on chart yes   Discharge Needs Assessment   Outpatient/Agency/Support Group Needs homecare agency   Equipment Currently Used at Home cane, straight;walker, front wheeled   Discharge Facility/Level of Care Needs Home with Home Health (code 6)   Transportation Available family or friend will provide   Referral Information   Admission Type inpatient   Arrived From home or self-care     [facility]  Care Management Note    Patient Name: Hector [name]  Date of Birth: 1960-12-30  Sex: male  Date/Time of Admission: 01/15/2024 12:46 PM  Room/Bed: 527/B  Payor: HEALTH PLAN MEDICAID / Plan: HEALTH PLAN MEDICAID / Product Type: Medicaid [facility] /    LOS: 4 days   Primary Care Providers:  Robin Searing, MD, MD (General)    Admitting Diagnosis:  Failure to thrive in adult [R62.7]    Assessment:   Patient seen this date for follow up with need for home health.  PT and OT consulted and patient ambulated 100 feet with rollator walker with PT.  Patient needs to go to appointment on the 17th  at [facility] to see Oncology Urology.  SNF unable to transport patient.  Significant other will most likely have to  transport patient to facility.  CM reaching out to Health plan for possible transportation.  Provided patient with a listing of CMS ratings for Home Health serving the [phone] zip code. CM to follow up with home health referrals.      Discharge Plan:  Home with Home Health (code 6)  To be determined.    The patient will continue to be evaluated for developing discharge needs.     Case Manager: Abbott Pao, RN

## 2024-01-19 NOTE — Discharge Summary (Signed)
[facility]  DISCHARGE SUMMARY    PATIENT NAME:  Hector [name], Hector [name]  MRN:  [id number]  DOB:  Dec 31, 1960    ENCOUNTER DATE:  01/15/2024  INPATIENT ADMISSION DATE: 01/15/2024  DISCHARGE DATE:  01/20/2024    ATTENDING PHYSICIAN: Cephus Shelling, DO  SERVICE: [facility] FAMILY MEDICINE 2  PRIMARY CARE PHYSICIAN: Robin Searing, MD     No lay caregiver identified.    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] 01/16/2024    Inability to perform activities of daily living [Z78.9] 01/15/2024      Resolved Hospital Problems    Diagnosis     Failure to thrive in adult [R62.7]      Active Non-Hospital Problems    Diagnosis Date Noted    AKI (acute kidney injury) (CMS HCC) 01/06/2024    COPD exacerbation (CMS HCC) 01/05/2024    Urinary frequency 10/20/2023    Carotid stenosis, asymptomatic, right 06/29/2023    Carotid stenosis, right 06/03/2023    Coronary artery calcification seen on CT scan 06/03/2023    Mental disorder 09/25/2021    Dyslipidemia 09/25/2021    Annual physical exam 09/25/2021    Chronic obstructive pulmonary disease (CMS HCC) 06/18/2021    Tobacco use disorder 05/29/2021    Cirrhosis of liver without ascites (CMS HCC) 05/01/2021    Alcohol abuse 05/01/2021         Current Discharge Medication List        START taking these medications.        Details   mirabegron 25 mg Tablet Sustained Release 24 hr  Commonly known as: MYRBETRIQ   25 mg, Oral, DAILY  Qty: 30 Tablet  Refills: 0            CONTINUE these medications - NO CHANGES were made during your visit.        Details   albuterol sulfate 90 mcg/actuation oral inhaler  Commonly known as: Ventolin HFA   2 Puffs, Inhalation, EVERY 6 HOURS PRN  Qty: 1 Each  Refills: 5     fluticasone propion-salmeteroL 230-21 mcg/actuation oral  inhaler  Commonly known as: Advair HFA   2 Puffs, Inhalation, 2 TIMES DAILY, RINSE MOUTH WITH WATER AFTER INHALER USE  Qty: 1 g  Refills: 5     ipratropium-albuteroL 0.5 mg-3 mg(2.5 mg base)/3 mL nebulizer solution  Commonly known as: DUONEB   3 mL, Nebulization, 3 TIMES DAILY PRN  Qty: 270 mL  Refills: 1     OLANZapine 10 mg Tablet  Commonly known as: zyPREXA   10 mg, Oral, NIGHTLY  Refills: 0     oxygen gas  Commonly known as: o2   4 L/min, Nasal, CONTINUOUS  Refills: 0     pantoprazole 40 mg Tablet, Delayed Release (E.C.)  Commonly known as: PROTONIX   TAKE 1 TABLET (40 MG TOTAL) BY MOUTH TWICE A DAY 30 MINTUTES BEFORE MEALS FOR 90 DAYS  Qty: 60 Tablet  Refills: 3     rosuvastatin 10 mg Tablet  Commonly known as: CRESTOR   10 mg, Oral, EVERY EVENING  Qty: 90 Tablet  Refills: 1     traZODone 50 mg Tablet  Commonly known as: DESYREL   50 mg, Oral, NIGHTLY, Unsure dosage  Refills: 0  STOP taking these medications.      aspirin 81 mg Tablet, Delayed Release (E.C.)  Commonly known as: ECOTRIN     clopidogreL 75 mg Tablet  Commonly known as: PLAVIX     tamsulosin 0.4 mg Capsule  Commonly known as: FLOMAX            Discharge med list refreshed?  YES     Allergies   Allergen Reactions    Bee Venom Protein (Honey Bee)      Bee stings     HOSPITAL PROCEDURE(S):   No orders of the defined types were placed in this encounter.      REASON FOR HOSPITALIZATION AND HOSPITAL COURSE   BRIEF HPI:  This is a 63 y.o., male with a PMH of COPD on 5L NC at baseline, HTN, HLD, alcohol use disorder in remission, liver cirrhosis presents due to painful hematuria. Patient was recently hospitalized from 01/05/24 to 01/09/24 for similar symptoms, with less pain at the time. During that admission, patient was started on CBI and evaluated by urology, and concern for possible malignancy was raised due to abnormal CT with soft tissue fullness at UVJ, with atypical urothelial cells on cytopathology. Patient at the time was referred to  Urologic Oncology and has an appointment on 02/01/24 to establish with them.      Today, patient presents due to pain and continued hematuria, as well as inability to care for himself secondary to this. He states that the urine has lightened up occasionally but continues to be dark today. No frank red blood. He states that his pain with foley catheter changes. He states that his significant other is not able to help him as well. He reports abdominal pain at this time, but otherwise no concerning symptoms.    PHYSICAL EXAM  General: appears chronically ill, mildly obese, appears older than stated age, and no distress. Foley catheter and CBI in place draining straw yellow urine with tinge of red  Eyes: Conjunctiva clear, Pupils equal and round.   HENT:Head atraumatic and normocephalic, Nose without erythema, Mouth mucous membranes moist.   Neck: supple, symmetrical, trachea midline  Lungs: Breathing nonlabored, Clear to auscultation bilaterally.   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    BRIEF HOSPITAL NARRATIVE:     Patient was admitted for suspected acute complicated UTI, present on admission likely d/t indwelling catheter, painful hematuria and inability to complete ADLs. Workup was otherwise unremarkable. He was treated with 5 days of IV Rocephin to treat Acinetobacter Baumannii bacteria. Per urology, patient should be treated with total 14 days antibiotics. Sensitivities revealed UTI can be treated with       Acinetobacter Baumannii Complex       Not Specified     Ampicillin/Sulbactam <=8/4 Sensitive     Cefepime <=2 Sensitive     Ceftazidime 4 Sensitive     Gentamicin <=2 Sensitive     Meropenem <=1 Sensitive      Tobramycin <=2 Sensitive     Trimethoprim/Sulfamethoxazole <=2/38 Sensitive        Patient had intermittent painful hematuria 2/2 to suspected bladder/renal cancer. He was started on mirabegron 25mg  daily and continued CBI for bladder spasm pain. He required q 4 hour prn morphine for breakthrough and  scheduled Ofirmev 1 g q 6 hours for pain management. He did have Miralax daily for bowel regimen in setting of myrbetriq. Home aspirin, Flomax and Plavix were held in the setting of hematuria/spasm (has been held since 01/05/24).     Patient qualified for SNF but remained interested in going home as transportation to [place] from SNF would not be possible for his urology oncology appointments. Because of his painful bladder spasm, he could not be weaned off of CBI. Patient could not be accepted to [facility] for transfer d/t absence of uro-onc. He was transferred to [facility] to escalated level of care (Dr. Fredricka Bonine accepting) for cystoscopy, bladder pain treatment.     TRANSITION/POST DISCHARGE CARE/PENDING TESTS/REFERRALS:   Patient was given mirabegron 25mg  po daily and continued on IV Rocephin on transfer  Ensure resolution of UTI and that patient follows up with Urology-Oncology in [facility] 2/17  HOLD Aspirin and Plavix until seen by Uro-Oncology    CONDITION ON DISCHARGE:  A. Ambulation: Full ambulation  B. Self-care Ability: Complete  C. Cognitive Status Alert and Oriented x 3  D. Code status at discharge: Full Code      LINES/DRAINS/WOUNDS AT DISCHARGE:   Patient Lines/Drains/Airways Status       Active Line / Dialysis Catheter / Dialysis Graft / Drain / Airway / Wound       Name Placement date Placement time Site Days    Peripheral IV Proximal;Right Basilic  (medial side of arm) 01/15/24  --  -- 5    Foley Catheter 01/16/24  0100  -- 4    Wound   Right;Medial Thigh 01/16/24  0059  -- 4                    DISCHARGE DISPOSITION:  Home discharge  DISCHARGE INSTRUCTIONS:  Post-Discharge Follow Up Appointments        Go to Robin Searing, MD in 1 week    Phone: [phone]    Where: [facility]      Tuesday Jan 26, 2024    Return Patient Visit with Robin Searing, MD at  9:15 AM      Thursday Jan 28, 2024    Testing with [facility] at 11:00 AM      Monday Feb 01, 2024    New Patient Visit with Isaias Sakai, APRN,AGPCNP-BC at 11:30 AM      Wednesday Feb 03, 2024    Return Patient Visit with Jamesetta Geralds, MD at 10:00 AM      Monday Mar 14, 2024    Return Patient Visit with Consuelo Pandy, PA-C at  1:40 PM      Tuesday Apr 26, 2024    Return Patient Visit with Biscan-Banks, Doroteo Glassman, CFNP at  1:00 PM      Gastroenterology, [facility] 3  [facility] 3, [place]  [address] [place] [address]  [phone] Imaging Services, [facility] 1  [facility] 1, [facility]  [address]  [place] [address]  [phone] Internal Medicine, [facility], [place]  [address]  [place] [address]  [phone]    Pulmonology, [facility], [address] [place]  [address]  [place] [address]  [phone] Urology Oncology, [facility]  [facility], [facility]  [address] [facility]  [place] [address]  [phone] Vascular  Surgery, [facility] 1  [facility] 1, [facility]  [address]  [place] [address]  [phone]             Refer to Home Health - [facility] - Crista Curb   Referral Type: Home Health   Requested Specialty: HOME HEALTH AGENCY   Number of Visits Requested: 999     DISCHARGE INSTRUCTION - RESUME HOME DIET     Diet: RESUME HOME DIET      DISCHARGE INSTRUCTION - MISC    START Myrbetriq 25 mg daily  Continue IV Rocephin for 10 days  DO NOT take aspirin or Plavix until you see urologist in [place]  Follow up with PCP in one week  Follow up  with urology oncology on 02/17 as scheduled        Willeen Cass, DO  [facility]  Family Medicine Resident, PGY-1    Patient was seen and examined by me on the day of discharge.  Discharge summary and plans including follow-up discussed with the patient. The patient expressed understanding.  Cephus Shelling, DO        Copies sent to Care Team         Relationship Specialty Notifications Start End    Robin Searing, MD PCP - General INTERNAL MEDICINE Admissions 07/22/21     Phone: [phone] Fax: [phone]         [address] [place]  [id number]    Leim Fabry  NURSE PRACTITIONER Admissions 06/22/23     Phone: [phone] Fax: [phone]         [phone] [place] [facility] [address]            Referring providers can utilize [email or web address] to access their referred [facility] Medicine patient's information.

## 2024-01-19 NOTE — Care Plan (Signed)
Problem: Adult Inpatient Plan of Care  Goal: Plan of Care Review  Outcome: Ongoing (see interventions/notes)  Goal: Patient-Specific Goal (Individualized)  Outcome: Ongoing (see interventions/notes)  Flowsheets (Taken 01/19/2024 2052)  Individualized Care Needs: Pain management.  Anxieties, Fears or Concerns: Concerned about bladder spasms.  Goal: Absence of Hospital-Acquired Illness or Injury  Outcome: Ongoing (see interventions/notes)  Intervention: Identify and Manage Fall Risk  Recent Flowsheet Documentation  Taken 01/19/2024 2052 by Lawrence Marseilles, RN  Safety Promotion/Fall Prevention:   activity supervised   fall prevention program maintained   nonskid shoes/slippers when out of bed   safety round/check completed  Intervention: Prevent Skin Injury  Recent Flowsheet Documentation  Taken 01/19/2024 2052 by Lawrence Marseilles, RN  Body Position: foot of bed elevated  Skin Protection:   adhesive use limited   tubing/devices free from skin contact  Intervention: Prevent and Manage VTE (Venous Thromboembolism) Risk  Recent Flowsheet Documentation  Taken 01/19/2024 2052 by Lawrence Marseilles, RN  VTE Prevention/Management:   ambulation promoted   dorsiflexion/plantar flexion performed  Intervention: Prevent Infection  Recent Flowsheet Documentation  Taken 01/19/2024 2052 by Lawrence Marseilles, RN  Infection Prevention: rest/sleep promoted  Goal: Optimal Comfort and Wellbeing  Outcome: Ongoing (see interventions/notes)  Intervention: Provide Person-Centered Care  Recent Flowsheet Documentation  Taken 01/19/2024 2052 by Lawrence Marseilles, RN  Trust Relationship/Rapport:   care explained   choices provided   emotional support provided   empathic listening provided   questions answered   questions encouraged   reassurance provided   thoughts/feelings acknowledged  Goal: Rounds/Family Conference  Outcome: Ongoing (see interventions/notes)     Problem: Skin Injury Risk Increased  Goal: Skin Health and Integrity  Outcome: Ongoing (see interventions/notes)  Intervention: Optimize Skin  Protection  Recent Flowsheet Documentation  Taken 01/19/2024 2052 by Lawrence Marseilles, RN  Pressure Reduction Techniques: Frequent weight shifting encouraged  Pressure Reduction Devices: Repositioning wedges/pillows utilized  Skin Protection:   adhesive use limited   tubing/devices free from skin contact  Activity Management: ROM, active encouraged  Head of Bed (HOB) Positioning: HOB elevated  Intervention: Promote and Optimize Oral Intake  Recent Flowsheet Documentation  Taken 01/19/2024 2052 by Lawrence Marseilles, RN  Oral Nutrition Promotion: rest periods promoted     Problem: Fall Injury Risk  Goal: Absence of Fall and Fall-Related Injury  Outcome: Ongoing (see interventions/notes)  Intervention: Identify and Manage Contributors  Recent Flowsheet Documentation  Taken 01/19/2024 2052 by Lawrence Marseilles, RN  Self-Care Promotion:   independence encouraged   BADL personal objects within reach   BADL personal routines maintained  Medication Review/Management: medications reviewed  Intervention: Promote Injury-Free Environment  Recent Flowsheet Documentation  Taken 01/19/2024 2052 by Lawrence Marseilles, RN  Safety Promotion/Fall Prevention:   activity supervised   fall prevention program maintained   nonskid shoes/slippers when out of bed   safety round/check completed     Problem: Wound  Goal: Optimal Coping  Outcome: Ongoing (see interventions/notes)  Intervention: Support Patient and Family Response  Recent Flowsheet Documentation  Taken 01/19/2024 2052 by Lawrence Marseilles, RN  Family/Support System Care: self-care encouraged  Supportive Measures:   active listening utilized   decision-making supported   positive reinforcement provided   problem-solving facilitated   relaxation techniques promoted   self-care encouraged  Goal: Optimal Functional Ability  Outcome: Ongoing (see interventions/notes)  Intervention: Optimize Functional Ability  Recent Flowsheet Documentation  Taken 01/19/2024 2052 by Lawrence Marseilles, RN  Activity Management: ROM, active encouraged  Activity Assistance  Provided: assistance, stand-by  Goal: Absence of Infection Signs and Symptoms  Outcome: Ongoing (see  interventions/notes)  Intervention: Prevent or Manage Infection  Recent Flowsheet Documentation  Taken 01/19/2024 2052 by Lawrence Marseilles, RN  Fever Reduction/Comfort Measures:   lightweight clothing   lightweight bedding  Goal: Improved Oral Intake  Outcome: Ongoing (see interventions/notes)  Intervention: Promote and Optimize Oral Intake  Recent Flowsheet Documentation  Taken 01/19/2024 2052 by Lawrence Marseilles, RN  Oral Nutrition Promotion: rest periods promoted  Goal: Optimal Pain Control and Function  Outcome: Ongoing (see interventions/notes)  Intervention: Prevent or Manage Pain  Recent Flowsheet Documentation  Taken 01/19/2024 2052 by Lawrence Marseilles, RN  Sleep/Rest Enhancement: regular sleep/rest pattern promoted  Goal: Skin Health and Integrity  Outcome: Ongoing (see interventions/notes)  Intervention: Optimize Skin Protection  Recent Flowsheet Documentation  Taken 01/19/2024 2052 by Lawrence Marseilles, RN  Pressure Reduction Techniques: Frequent weight shifting encouraged  Pressure Reduction Devices: Repositioning wedges/pillows utilized  Activity Management: ROM, active encouraged  Head of Bed (HOB) Positioning: HOB elevated  Goal: Optimal Wound Healing  Outcome: Ongoing (see interventions/notes)  Intervention: Promote Wound Healing  Recent Flowsheet Documentation  Taken 01/19/2024 2052 by Lawrence Marseilles, RN  Oral Nutrition Promotion: rest periods promoted  Pressure Reduction Techniques: Frequent weight shifting encouraged  Pressure Reduction Devices: Repositioning wedges/pillows utilized  Sleep/Rest Enhancement: regular sleep/rest pattern promoted  Activity Management: ROM, active encouraged     POC reviewed and continued.

## 2024-01-19 NOTE — Care Management Notes (Signed)
Patient discussed in DC rounds today.  He is a home plan per his request with no SS needs at this time.

## 2024-01-19 NOTE — OT Treatment (Signed)
[facility]  Rehabilitation Services  Occupational Therapy Progress Note    Patient Name: Hector [name]  Date of Birth: 30-Sep-1961  Height:  175.3 cm (5\' 9" )  Weight:  103 kg (227 lb 11.8 oz)  Room/Bed: 527/B  Payor: HEALTH [employer]AN MEDICAID / Plan: HEALTH PLAN MEDICAID / Product Type: Medicaid [place] /     Assessment:       01/19/24 0912   Therapist Pager   OT Assigned/ Pager # [phone], Michaell Cowing COTA/L, CLT   Daily Activity AM-PAC/6-clicks Score   Putting on/Taking off clothing on lower body 2   Bathing 2   Toileting 2   Putting on/Taking off clothing on upper body 3   Personal grooming 3   Eating Meals 3   Raw Score Total 15   Standardized (t-scale) Score 34.69   CMS 0-100% Score 56.46   CMS Modifier CK           Discharge Needs:   Equipment Recommendation:TBD    Discharge Disposition: SNF vs Home with home health Based on diagnosis and level of independence with I/ADLs and mobility prior to admission vs current functional presentation/deficits, this patient requires continued OT services in a skilled care facility in order to achieve significant functional improvements in one or more of these deficit areas: self care; IADL tasks, functional mobility; neuro/motor function; balance; endurance; range of motion, cognition      Plan:   Continue to follow patient according to established plan of care.  The risks/benefits of therapy have been discussed with the patient/caregiver and he/she is in agreement with the established plan of care.     Subjective & Objective:      Pt seen this day with clearance from nursing, pt was supine in bed, sup for supine to edge of bed, displayed fair sitting balance at edge of bed, displayed fair sitting balance at edge of bed, CGA for sit to stand and maintain with DME for transfer to chair and back to edge of bed and to supine, all needs met upon OT exit from room.    Pain:  no reports of pain    Level of consciousness alert    Orientation x3      Patient education transfer  safety            Therapist:   Michaell Cowing, COTA     Start Time:  0102  End Time: 0925  Total Treatment Time: 13 minutes  Charges Entered:  therapeutic activity  Department Number: [phone]

## 2024-01-19 NOTE — PT Treatment (Signed)
[facility] [place] [facility]   Acute Care Physical Therapy  Medical [address]  [place], [place] [address]  [phone]         Physical Therapy Acute Care Daily Treatment Record       Date: 01/19/2024  Time: 09:13  Patient's Name: Hector [name]  Date of Birth: 1960-12-23  Room Number: 527/B    Pertinent Information since last Physical Therapy visit :    No significant change since last session.      Precautions / Limitations :    Fall precautions     Weight Bearing status, if applicable :    No Weightbearing restrictions     Subjective :   Patient reports he is feeling better today. Patient is agreeable to ambulate with therapy.      Pain Rating :   Pre-Treatment Pain: 0  Post-Treatment Pain: 0  Comments:                                  Orientation & Level of Orientation :    Alert, Cooperative, and Oriented Person, Place, and Time      6 Clicks Score :    1.) Turning from back to side while flat in bed without using a bed rail.   A little assistance (3)  2.) Moving from lying on back to sitting on the side of a flat bed without using bed rails.  A little assistance (3)  3.) Moving to and from a bed to a chair, including a wheelchair.   A little assistance (3)  4.) Standing up for chair using your arms (eg, wheelchair or bedside chair).  A little assistance (3)  5.) Walking in hospital room.  A little assistance (3)  6.) Climbing 3-5 steps with a railing.   A little assistance (3)    6 Clicks Raw Score: 18    6 Clicks Plan: Score 18-23 - PT consult is appropriate; may need home health at discharge      Treatment :      Transfers:  Rolling in bed:    Standby Assist  with assist of 1                                                Supine to sit:   Standby Assist   with assist of 1                                Sit to stand:  Contact Guard   with assist of 1                                                                Gait:     Distance:    100'              Device Utilized:   Teaching laboratory technician           Level of Assist: Advertising account executive  with assist of 1      Weight-Bearing Status:   No Weightbearing restrictions                        Gait Deviations: Step length decreased           Other Treatment Interventions:       Daily Assessment of Status :    Patient performed supine to sit transfer x SBA x 1 and sit to stand x CGA x 1. Patient ambulated 100 feet with rollator x CGA A x 1. Patient demonstrates increased endurance and balance with gait.       Timed Charge Number of Minutes   Neuromuscular Re-Education     Gait Training  11   Therapeutic Activity     Therapeutic Exercise      Canalith Re-positioning   Non-timed charge    Other     Total Treatment Time  11     Patient Education :   Fall prevention strategies discussed with patient regarding gait and transfers         Interdisciplinary Communication :    Contacted Social Services and/or Care Management for discharge needs including equipment            Discharge Planning :   Home with Home Health - Patient would benefit from Home Health services at discharge for improved functional strength, gait/balance training, improved overall functional mobility, improved quality of life, to decrease risk of falls, and promote functional independence     Plan :   Continue Physical Therapy program working on stated goals for continued functional improvement     Becky Augusta, PT, DPT

## 2024-01-20 ENCOUNTER — Encounter: Payer: Self-pay | Admitting: Family Medicine

## 2024-01-20 ENCOUNTER — Ambulatory Visit: Payer: Self-pay | Admitting: Family Medicine

## 2024-01-20 ENCOUNTER — Encounter (HOSPITAL_COMMUNITY): Payer: Self-pay

## 2024-01-20 ENCOUNTER — Inpatient Hospital Stay (HOSPITAL_COMMUNITY): Admission: AD | Admit: 2024-01-20 | Payer: Self-pay | Source: Ambulatory Visit | Admitting: Neurology

## 2024-01-20 LAB — ADULT ROUTINE BLOOD CULTURE, SET OF 2 BOTTLES (BACTERIA AND YEAST)
BLOOD CULTURE, ROUTINE: NO GROWTH
BLOOD CULTURE, ROUTINE: NO GROWTH

## 2024-01-20 LAB — CBC
HCT: 33.1 % — ABNORMAL LOW (ref 36.0–46.0)
HGB: 11.4 g/dL — ABNORMAL LOW (ref 13.9–16.3)
MCH: 32 pg (ref 25.4–34.0)
MCHC: 34.4 g/dL (ref 30.0–37.0)
MCV: 92.8 fL (ref 80.0–100.0)
MPV: 9 fL (ref 7.5–11.5)
PLATELETS: 62 10*3/uL — ABNORMAL LOW (ref 130–400)
RBC: 3.57 10*6/uL — ABNORMAL LOW (ref 4.30–5.90)
RDW: 15.2 % — ABNORMAL HIGH (ref 11.5–14.0)
WBC: 3.3 10*3/uL — ABNORMAL LOW (ref 4.5–11.5)

## 2024-01-20 LAB — BASIC METABOLIC PANEL
ANION GAP: 5 mmol/L (ref 5–19)
BUN/CREA RATIO: 14 (ref 6–20)
BUN: 11 mg/dL (ref 9–20)
CALCIUM: 8.2 mg/dL — ABNORMAL LOW (ref 8.4–10.2)
CHLORIDE: 112 mmol/L — ABNORMAL HIGH (ref 98–107)
CO2 TOTAL: 24 mmol/L (ref 22–30)
CREATININE: 0.79 mg/dL (ref 0.66–1.20)
ESTIMATED GFR: >60 mL/min/{1.73_m2} (ref >60)
GLUCOSE: 104 mg/dL (ref 74–106)
POTASSIUM: 3.9 mmol/L (ref 3.5–5.1)
SODIUM: 141 mmol/L (ref 137–145)

## 2024-01-20 MED ORDER — MIRABEGRON ER 25 MG TABLET,EXTENDED RELEASE 24 HR
25.0000 mg | ORAL_TABLET | Freq: Every day | ORAL | 0 refills | Status: DC
Start: 2024-01-20 — End: 2024-02-29

## 2024-01-20 NOTE — Telephone Encounter (Signed)
 Copied from CRM [id number]. Topic: Clinical - Red Word Triage >> Jan 20, 2024  3:58 PM Drema MATSU wrote: Red Word that prompted transfer to Nurse Triage: Patient thinks he has a secondary infection after Flu. He is experiencing non stop coughing and fatigue.  Chief Complaint: Cough Symptoms: Cough and fatigue Frequency: 4 days Pertinent Negatives: Patient denies fever Disposition: [] ED /[] Urgent Care (no appt availability in office) / [x] Appointment(In office/virtual)/ []  [facility]/ [] Home Care/ [] Refused Recommended Disposition /[] [place]/ []  Follow-up with PCP Additional Notes: Patient called in to report a cough that has been ongoing for 4 days. Patient stated he had the flu 12 days ago and all other flu symptoms have resolved. Patient stated that the cough seems to get worse everyday. Patient stated that he has coughing spells throughout the day. Patient reported the cough produces yellow phlegm. Patient stated that the cough is making him feel fatigued. Patient denied SOB. Patient denied fever. Advised patient to be seen in office tomorrow. No availability with PCP. Scheduled patient with another provider in the office. Advised patient to call back if symptoms worsen. Patient complied.   Reason for Disposition  [1] Continuous (nonstop) coughing interferes with work or school AND [2] no improvement using cough treatment per Care Advice  Answer Assessment - Initial Assessment Questions 1. ONSET: When did the cough begin?      4 days ago, states he had the flu 12 days ago 2. SEVERITY: How bad is the cough today?      States the cough gets worse each day 3. SPUTUM: Describe the color of your sputum (none, dry cough; clear, white, yellow, green)     Yellow 4. HEMOPTYSIS: Are you coughing up any blood? If so ask: How much? (flecks, streaks, tablespoons, etc.)     Denies 5. DIFFICULTY BREATHING: Are you having difficulty breathing? If Yes, ask: How bad is  it? (e.g., mild, moderate, severe)    - MILD: No SOB at rest, mild SOB with walking, speaks normally in sentences, can lie down, no retractions, pulse < 100.    - MODERATE: SOB at rest, SOB with minimal exertion and prefers to sit, cannot lie down flat, speaks in phrases, mild retractions, audible wheezing, pulse 100-120.    - SEVERE: Very SOB at rest, speaks in single words, struggling to breathe, sitting hunched forward, retractions, pulse > 120      Denies 6. FEVER: Do you have a fever? If Yes, ask: What is your temperature, how was it measured, and when did it start?     Denies 7. CARDIAC HISTORY: Do you have any history of heart disease? (e.g., heart attack, congestive heart failure)      Denies 8. LUNG HISTORY: Do you have any history of lung disease?  (e.g., pulmonary embolus, asthma, emphysema)     Denies 10. OTHER SYMPTOMS: Do you have any other symptoms? (e.g., runny nose, wheezing, chest pain)     Cough and fatigue due to cough  Protocols used: Cough - Acute Productive-A-AH

## 2024-01-20 NOTE — Progress Notes (Signed)
 [facility] [address] [place], [place] [address] [phone]  Date:  01/21/2024   Name:  Curtis Luna   DOB:  1961/11/28   MRN:  [id number]  PCP:  Watt Harlene BROCKS, MD    Chief Complaint: Cough (Dx with flu about 2 weeks ago. )   History of Present Illness:  Curtis Luna is a 63 y.o. very pleasant male patient who presents with the following:  Patient seen today with concern of cough.  Most recent visit with myself was in January for routine follow-up He contacted me yesterday as follows and we made this appointment.  history of coronary artery disease, ocular migraine, GERD, hyperlipidemia   Hi Dr. Watt, Twelve days ago, I came down with what seemed like the flu or something similar, with typical symptoms including body aches, a general feeling of being run down, and a fever ranging from 100 to 101 degrees. These symptoms lasted for about 4-5 days. After about 5-7 days, I started feeling better, but three to four days ago, I developed a nasty cough. This cough has been quite severe, and I am now starting to feel increasingly run down and lacking energy, especially towards the end of the day. Is this typical for what is going around? Should I come in for an appointment so you can listen to my lungs, or would a virtual visit be sufficient? Alternatively, is this something that will just run its course?  At this point he has mostly just the persistent cough No fever at this point.  He did have a fever earlier on in the illness. 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   Seasonal affective disorder (HCC) 10/02/2014   Internal hemorrhoid 04/20/2014   Chronic low back pain 04/20/2014   Anxiety 04/20/2014   Esophageal reflux 04/04/2011   Diverticulosis of colon 04/04/2011    Past Medical History:  Diagnosis Date   Abdominal pain, right lower quadrant    Acute bronchospasm    Allergy    Anxiety    on and off   Backache, unspecified    Chicken pox    Diverticulosis of colon (without mention of hemorrhage)    Esophageal reflux    External hemorrhoids without mention of complication    thrombosed   Hiatal hernia    Hyperlipidemia    no medicines    Inguinal hernia without mention of obstruction or gangrene, unilateral or unspecified, (not specified as recurrent)    Migraine    Neuropathy    Obstructive sleep apnea (adult) (pediatric)    uses oral appliance   Other symptoms involving digestive system(787.99)    Pain in joint, forearm    Pain in joint, shoulder region    Seasonal allergies    Sleep apnea    no cpap but uses dental device    Thoracic or lumbosacral neuritis or radiculitis, unspecified     Past  Surgical History:  Procedure Laterality Date   COLON RESECTION  10/06   sigmoid colon   COLONOSCOPY     INGUINAL HERNIA REPAIR  12/2012   Left   UPPER GASTROINTESTINAL ENDOSCOPY     WISDOM TOOTH EXTRACTION      Social History   Tobacco Use   Smoking status: Former    Current packs/day: 0.00    Types: Cigarettes, Cigars    Quit date: 10/04/1987    Years since quitting: 36.3   Smokeless tobacco: Never  Vaping Use   Vaping status: Never Used  Substance Use Topics   Alcohol use: Yes    Alcohol/week: 14.0 standard drinks of alcohol    Types: 14 Standard drinks or equivalent per week    Comment: 2 daily    Drug use: No    Family History  Problem Relation Age of Onset   Lung cancer  Mother 69       Deceased   Other Father        MVA   Aneurysm Sister        Brain   Healthy Brother        x2   Heart disease Paternal Grandmother    Heart attack Paternal Grandmother    Allergies Daughter        x2   Migraines Daughter        x1   Diabetes Maternal Aunt    Neuropathy Other    Colon cancer Neg Hx    Colon polyps Neg Hx    Esophageal cancer Neg Hx    Rectal cancer Neg Hx    Stomach cancer Neg Hx    Pancreatic cancer Neg Hx    Allergic rhinitis Neg Hx    Angioedema Neg Hx    Asthma Neg Hx    Atopy Neg Hx    Eczema Neg Hx    Immunodeficiency Neg Hx    Urticaria Neg Hx     Allergies  Allergen Reactions   Escitalopram Hives    Pt felt anxious  Pt felt his skin was on fire   Influenza Vaccines Other (See Comments)    Brain fog, dizziness, tinnitus   Protonix  [Pantoprazole  Sodium] Other (See Comments)    Tingling of extremities    Medication list has been reviewed and updated.  Current Outpatient Medications on File Prior to Visit  Medication Sig Dispense Refill   Ascorbic Acid (VITAMIN C) 1000 MG tablet Take 1,000 mg by mouth daily.     b complex vitamins tablet Take 1 tablet by mouth daily. Every other day     cholecalciferol (VITAMIN D3) 25 MCG (1000 UT) tablet Take 1,000 Units by mouth daily.     Famotidine (PEPCID PO) Take by mouth as needed.     gabapentin  (NEURONTIN ) 100 MG capsule Take 1 capsule (100 mg total) by mouth 3 (three) times daily. (Patient taking differently: Take 100 mg by mouth 4 (four) times daily.) 270 capsule 0   LORazepam  (ATIVAN ) 1 MG tablet Take 1 tablet (1 mg total) by mouth as needed for anxiety. May take 1 per day 30 tablet 0   rosuvastatin  (CRESTOR ) 20 MG tablet Take 1 tablet (20 mg total) by mouth daily. 90 tablet 0   No current facility-administered medications on file prior to visit.    Review of Systems:  As per HPI- otherwise negative.   Physical Examination: Vitals:   01/21/24 1139  BP: 110/70  Pulse:  71  Resp: 18  Temp: 98  F (36.7 C)  SpO2: 97%   Vitals:   01/21/24 1139  Weight: 143 lb 6.4 oz (65 kg)  Height: 5' 5 (1.651 m)   Body mass index is 23.86 kg/m. Ideal Body Weight: Weight in (lb) to have BMI = 25: 149.9  GEN: no acute distress.  Normal weight, looks well HEENT: Atraumatic, Normocephalic.  Bilateral TM wnl, oropharynx normal.  PEERL,EOMI.   Ears and Nose: No external deformity. CV: RRR, No M/G/R. No JVD. No thrill. No extra heart sounds. PULM: Mild rhonchi in the left lower lobe-  no retractions. No resp. distress. No accessory muscle use. ABD: S, NT, ND, +BS. No rebound. No HSM. EXTR: No c/c/e PSYCH: Normally interactive. Conversant.    Assessment and Plan: Acute cough - Plan: DG Chest 2 View  Acute bronchitis, unspecified organism Patient seen today with concern of cough which has persisted after other symptoms of illness cleared up.  It sounds as though he may have had the flu or COVID now with a secondary bacterial bronchitis or pneumonia.  We will obtain a chest x-ray and then take next steps  Signed Harlene Schroeder, MD  Received chest x-ray- called pt No evidence of pneumonia, treat for bronchitis-called in Augmentin   DG Chest 2 View Result Date: 01/21/2024 CLINICAL DATA:  Cough, fever EXAM: CHEST - 2 VIEW COMPARISON:  03/16/2023 FINDINGS: The heart size and mediastinal contours are within normal limits. Both lungs are clear. The visualized skeletal structures are unremarkable. IMPRESSION: No active cardiopulmonary disease. Electronically Signed   By: Franky Crease M.D.   On: 01/21/2024 12:28

## 2024-01-20 NOTE — Progress Notes (Deleted)
      Established patient visit   Patient: Curtis Luna   DOB: 09-04-1961   63 y.o. Male  MRN: [id number] Visit Date: 01/21/2024  Today's healthcare provider: Manuelita Flatness, PA-C   No chief complaint on file.  Subjective     ***  Medications: Outpatient Medications Prior to Visit  Medication Sig   Ascorbic Acid (VITAMIN C) 1000 MG tablet Take 1,000 mg by mouth daily.   b complex vitamins tablet Take 1 tablet by mouth daily. Every other day   cholecalciferol (VITAMIN D3) 25 MCG (1000 UT) tablet Take 1,000 Units by mouth daily.   Famotidine (PEPCID PO) Take by mouth as needed.   gabapentin  (NEURONTIN ) 100 MG capsule Take 1 capsule (100 mg total) by mouth 3 (three) times daily. (Patient taking differently: Take 100 mg by mouth 4 (four) times daily.)   LORazepam  (ATIVAN ) 1 MG tablet Take 1 tablet (1 mg total) by mouth as needed for anxiety. May take 1 per day   rosuvastatin  (CRESTOR ) 20 MG tablet Take 1 tablet (20 mg total) by mouth daily.   No facility-administered medications prior to visit.    Review of Systems {Insert previous labs (optional):23779} {See past labs  Heme  Chem  Endocrine  Serology  Results Review (optional):1}   Objective    There were no vitals taken for this visit. {Insert last BP/Wt (optional):23777}{See vitals history (optional):1}  Physical Exam  ***  No results found for any visits on 01/21/24.  Assessment & Plan    There are no diagnoses linked to this encounter.  ***  No follow-ups on file.       Manuelita Flatness, PA-C  [facility] at [facility] [phone] (phone) [phone] (fax)  [facility]

## 2024-01-20 NOTE — Care Management Notes (Signed)
 Patient discussed in DC rounds today.  He is a home plan with no SS needs at this time.

## 2024-01-20 NOTE — OT Treatment (Signed)
[facility]  Rehabilitation Services  Occupational Therapy Progress Note    Patient Name: Hector [name]  Date of Birth: 12-Jul-1961  Height:  175.3 cm (5\' 9" )  Weight:  103 kg (227 lb 11.8 oz)  Room/Bed: 527/B  Payor: HEALTH PLAN MEDICAID / Plan: HEALTH PLAN MEDICAID / Product Type: Medicaid [place] /     Assessment:       01/20/24 0910   Therapist Pager   OT Assigned/ Pager # [phone], Michaell Cowing COTA/L, CLT   Daily Activity AM-PAC/6-clicks Score   Putting on/Taking off clothing on lower body 2   Bathing 2   Toileting 2   Putting on/Taking off clothing on upper body 3   Personal grooming 3   Eating Meals 3   Raw Score Total 15   Standardized (t-scale) Score 34.69   CMS 0-100% Score 56.46   CMS Modifier CK           Discharge Needs:   Equipment Recommendation:TBD    Discharge Disposition: SNF vs home with home health    Plan:   Continue to follow patient according to established plan of care.  The risks/benefits of therapy have been discussed with the patient/caregiver and he/she is in agreement with the established plan of care.     Subjective & Objective:      Pt seen this day with clearance from nursing, pt was sup for supine to edge of bed, displayed fair sitting balance at edge of bed, CGA for sit to stand and maintain with DME for transfer from edge of bed to armed chair to edge of bed, pt returned to supine position in bed with all needs met upon OT exit from room.    Pain: no reports of pain    Level of consciousness alert    Orientation x3      Patient education transfers safety            Therapist:   Michaell Cowing, COTA     Start Time:  1610  End Time: 0923  Total Treatment Time: 13 minutes  Charges Entered:  therapeutic activity  Department Number: [phone]

## 2024-01-20 NOTE — PT Treatment (Signed)
[facility]   Acute Care Physical Therapy  Medical [address]  [place], [place] [address]  [phone]         Physical Therapy Acute Care Daily Treatment Record       Date: 01/20/2024  Time: 09:10  Patient's Name: Hector [name]  Date of Birth: 08-Jul-1961  Room Number: 527/B    Pertinent Information since last Physical Therapy visit :    No significant change since last session.      Precautions / Limitations :    Fall precautions     Weight Bearing status, if applicable :    No Weightbearing restrictions     Subjective :   Patient states he is feeling better this morning and is agreeable to ambulate with therapy.     Pain Rating :   Pre-Treatment Pain: 0  Post-Treatment Pain: 0  Comments:                                  Orientation & Level of Orientation :    Alert, Cooperative, and Oriented Person, Place, and Time      6 Clicks Score :    1.) Turning from back to side while flat in bed without using a bed rail.   A little assistance (3)  2.) Moving from lying on back to sitting on the side of a flat bed without using bed rails.  A little assistance (3)  3.) Moving to and from a bed to a chair, including a wheelchair.   A little assistance (3)  4.) Standing up for chair using your arms (eg, wheelchair or bedside chair).  A little assistance (3)  5.) Walking in hospital room.  A little assistance (3)  6.) Climbing 3-5 steps with a railing.   A little assistance (3)    6 Clicks Raw Score: 18    6 Clicks Plan: Score 18-23 - PT consult is appropriate; may need home health at discharge      Treatment :      Transfers:  Rolling in bed:    Standby Assist  with assist of 1                                                Supine to sit:   Standby Assist   with assist of 1                                Sit to stand:  Contact Guard   with assist of 1                                                                Gait:     Distance:    Greater than 100'              Device Utilized:   Teaching laboratory technician           Level of Assist: Advertising account executive  with assist of 1      Weight-Bearing Status:   No Weightbearing restrictions                        Gait Deviations: Step length decreased           Other Treatment Interventions:       Daily Assessment of Status :    Patient performed supine to sit transfer x SBA x 1 and sit to stand x CGA x 1. Patient ambulated 125 feet with rollator x CGA A x 1. Patient demonstrates increased endurance and balance with gait.       Timed Charge Number of Minutes   Neuromuscular Re-Education     Gait Training  11   Therapeutic Activity     Therapeutic Exercise      Canalith Re-positioning   Non-timed charge    Other     Total Treatment Time  11     Patient Education :   Fall prevention strategies discussed with patient regarding gait and transfers         Interdisciplinary Communication :    Discussed patient care with Occupational Therapy             Discharge Planning :   Home with Home Health - Patient would benefit from Home Health services at discharge for improved functional strength, gait/balance training, improved overall functional mobility, improved quality of life, to decrease risk of falls, and promote functional independence     Plan :   Continue Physical Therapy program working on stated goals for continued functional improvement     Becky Augusta, PT, DPT

## 2024-01-20 NOTE — Progress Notes (Signed)
 [facility] Medicine [facility]  Department of Family Medicine  Inpatient Progress Note      Name: Hector [name]  Age: 63 y.o.   Gender: male  MRN: [id number]  Date of Admission:  01/15/2024  PCP: Robin Searing, MD  Attending: Cephus Shelling, DO  Consultants:   Consult Orders Placed This Encounter   Procedures    IP CONSULT TO UROLOGY Requested Provider; Idamae Lusher, Lisa Roca    IP CONSULT TO SOCIAL WORK       SUBJECTIVE:      Hospital day 5    Patient seen and evaluated at bedside. Patient had large blood clot overnight that he was unable to pass on his own. He required manual irrigation and CBI for several hours post irrigation. He had painful bladder spasms with attempt to pass the clot. He denies any current abdominal pain and his urine is yellow-clear. Given the severity of his pain and inability to pass clots without CBI, I will attempt transfer to tertiary care facility today.    Review of Systems:  All other ROS negative unless stated    OBJECTIVE     BP (!) 156/70   Pulse 65   Temp 36.6 C (97.9 F)   Resp 17   Ht 1.753 m (5\' 9" )   Wt 103 kg (227 lb 11.8 oz)   SpO2 91%   BMI 33.63 kg/m     PHYSICAL EXAM  General: appears chronically ill, mildly obese, appears older than stated age, and no distress. Foley catheter and CBI in place draining straw yellow urine Eyes: Conjunctiva clear, Pupils equal and round.   HENT:Head atraumatic and normocephalic, Nose without erythema, Mouth mucous membranes moist.   Neck: supple, symmetrical, trachea midline  Lungs: Breathing nonlabored, Clear to auscultation bilaterally.   Cardiovascular: regular rate and rhythm, S1, S2 normal, systolic murmur noted (likely mitral-patient reports he's had a murmur all his life), click, rub or gallop  Abdomen: non-distended, Bowel sounds normal  Extremities: extremities normal, atraumatic, no cyanosis or edema  Skin: Skin warm and dry  Psychiatric: Normal affect, behavior, memory, thought content, judgement, and  speech.    Medications:    budesonide (PULMICORT RESPULES) 0.5 mg/2 mL nebulizer suspension, 0.5 mg, 2x/day **AND** arformoterol (BROVANA) 15 mcg/2 mL nebulizer solution, 15 mcg, 2x/day    [Held by provider] aspirin (ECOTRIN) enteric coated tablet 81 mg, 81 mg, Daily    cefTRIAXone (ROCEPHIN) 1 g in NS 100 mL IVPB - DOCKED, 1 g, Q24H    [Held by provider] clopidogrel (PLAVIX) 75 mg tablet, 75 mg, Daily    mirabegron (MYRBETRIQ) 24 hr extended release tablet, 25 mg, Daily    NS flush syringe, 10 mL, Q8HRS    OLANZapine (zyPREXA) tablet, 10 mg, NIGHTLY    [Held by provider] oxyBUTYnin (DITROPAN) tablet, 5 mg, 3x/day    pantoprazole (PROTONIX) delayed release tablet, 40 mg, Daily    polyethylene glycol (MIRALAX) oral packet, 17 g, Daily    rosuvastatin (CRESTOR) tablet, 10 mg, QPM    [Held by provider] tamsulosin (FLOMAX) capsule, 0.4 mg, Daily after Dinner    traZODone (DESYREL) tablet, 50 mg, NIGHTLY    Labs:  CBC:     3.3* (02/05 0615) \   11.4* (02/05 5409) /   62* (02/05 8119)      / 33.1* (02/05 0615) \      BMP:   141 (02/05 0615) 112* (02/05 0615) 11 (02/05 0615)    /     104 (  02/05 0615)   3.9 (02/05 0615) 24 (02/05 0615) 0.79 (02/05 0615) \         In's and Out's:    Intake/Output Summary (Last 24 hours) at 01/20/2024 1109  Last data filed at 01/20/2024 1050  Gross per 24 hour   Intake 3400 ml   Output 4200 ml   Net -800 ml     Radiology Results:  No results found.    Microbiology:  No results found for any visits on 01/15/24 (from the past 96 hour(s)).    ASSESSMENT     Hector [name] is a 63 y.o. year-old male admitted for  Active Hospital Problems    Diagnosis    Primary Problem: Hematuria    Chronic hypoxemic respiratory failure (CMS HCC)    Acute cystitis without hematuria    Bladder tumor    Inability to perform activities of daily living       PLAN   Painful hematuria, with indwelling foley catheter, suspected bladder malignancy awaiting further Uro/Onc workup  Inability to perform ADLs in the setting of  above   Acute uncomplicated Urinary Tract Infection d/t pan-sensitive Acinetobacter Baumannii   CBC revealed drop in Hgb 11.7, stabile  Initial CT A/P WO IV Contrast: Foley catheter in a decompressed bladder, likely some circumferential bladder wall thickening   Blood cultures drawn, NGTD x 3 days  U/A notable for trace ketones, large blood and leukocytes, culture growth of oxidase-negative G- rods, pan sensitive >100,000 Acinetobacter Baumannii   Continue IV ceftriaxone started Jan 31 (Day 6)- will need 14 days total of abx on DC  Continue mirabegron 25 mg daily due to persistent pain and bladder spasm per Urology recommendations  Discontinue pyridium TID, CBI  Bowel regimen- Miralax daily   Hold Flomax 0.4 mg qPM, Ditropan 5mg  TID   Maintain Foley catheter  Pain Control:   Ofirmev 1000 mg q6h scheduled   Morphine 2 mg q4h PRN for breakthrough pain   PT/OT/SS consulted  Patient cannot receive CBI at home or at SNF     Other Chronic Conditions:  COPD with chronic hypoxemic respiratory failure  On baseline O2 5L NC   Home Advair substituted with formulary budesonide/brovana nebs BID   Continue home albuterol nebulizer q6h PRN   Duonebs TID prn  CAD  Home aspirin and Plavix currently held in case of possible procedure.   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 to liver cirrhosis   Hgb 11.4, baseline 12  Iron studies: Ferritin 24, Iron 47, Iron sat 15, TIBC 320   AM CBC   Anxiety and Depression   Continue home trazodone 50 mg nightly   Continue home Zyprexa 10 mg nightly   GERD   Continue home Protonix 40 mg daily   Hyperlipidemia   Continue home Crestor 10 mg qPM    Diet: DIET REGULAR  DVT/PE: SCDs/ Venodynes/Impulse boots   Code Status: FULL CODE: ATTEMPT RESUSCITATION/CPR    Disposition:  Home with home health    Willeen Cass, DO   01/20/2024, 06:35   Family Medicine Resident, PGY-1  [facility] Medicine [facility]    I saw and examined the patient, reviewed the patient's medical history, the resident's findings on  physical exam and the patient's diagnosis and treatment plan with the resident.  I agree with the information documented.  Cephus Shelling, DO

## 2024-01-20 NOTE — Nurses Notes (Signed)
Attempted to call report nurse to call back.

## 2024-01-20 NOTE — Care Management Notes (Signed)
01/19/24 1338   Assessment Details   Assessment Type Continued Assessment   Date of Care Management Update 01/19/24   Date of Next DCP Update 01/21/24   Insurance Information/Type   Insurance type Medicaid   Employment/Financial   Patient has Prescription Coverage?  Yes        Name of Insurance Coverage for Medications Health plan   Financial/Environmental Concerns none   Living Environment   Select an age group to open "lives with" row.  Adult   Lives With significant other   Living Arrangements house   Able to Return to Prior Arrangements yes   Care Management Plan   Discharge Planning Status plan in progress   Projected Discharge Date 01/20/24   Discharge plan discussed with: Patient   CM will evaluate for rehabilitation potential yes   Patient choice offered to patient/family Yes   Form for patient choice reviewed/signed and on chart yes   Discharge Needs Assessment   Outpatient/Agency/Support Group Needs homecare agency   Equipment Currently Used at Home cane, straight;walker, front wheeled   Discharge Facility/Level of Care Needs Home with Home Health (code 6)   Transportation Available family or friend will provide   Referral Information   Admission Type inpatient   Arrived From home or self-care     [facility]  Care Management Note    Patient Name: Hector [name]  Date of Birth: 08-20-1961  Sex: male  Date/Time of Admission: 01/15/2024 12:46 PM  Room/Bed: 527/B  Payor: HEALTH PLAN MEDICAID / Plan: HEALTH PLAN MEDICAID / Product Type: Medicaid [facility] /    LOS: 5 days   Primary Care Providers:  Robin Searing, MD, MD (General)    Admitting Diagnosis:  Failure to thrive in adult [R62.7]    Assessment:   Patient seen this date for follow up with need for home health.  PT and OT consulted and patient ambulated 100 feet with rollator walker with PT.  Patient needs to go to appointment on the 17th  at [facility] to see Oncology Urology.  SNF unable to transport patient.  Significant other will most likely have to  transport patient to facility.  CM reaching out to Health plan for possible transportation.  Provided patient with a listing of CMS ratings for Home Health serving the [phone] zip code. CM to follow up with home health referrals.      2/5- Patient set up with transportation to Oncology/Urology appointment 02/01/24.  Transportation will call morning of and pick patient and significant other up at home address at 09:30 to arrive in [place] prior to 11:00 am appointment..  Patient is aware.  CM also sent in application for patient to [employer] for possible transportation needs.   Home health set up but unable to see until the 10th of February.  Orders placed and Freedom of choice to be completed.  CM continues to follow and assist with any pending discharge needs.      Discharge Plan:  Home with Home Health (code 6)  [facility] home health.      The patient will continue to be evaluated for developing discharge needs.     Case Manager: Abbott Pao, RN

## 2024-01-20 NOTE — Care Plan (Signed)
Problem: Adult Inpatient Plan of Care  Goal: Plan of Care Review  Outcome: Ongoing (see interventions/notes)  Goal: Patient-Specific Goal (Individualized)  Outcome: Ongoing (see interventions/notes)  Goal: Absence of Hospital-Acquired Illness or Injury  Outcome: Ongoing (see interventions/notes)  Intervention: Identify and Manage Fall Risk  Recent Flowsheet Documentation  Taken 01/20/2024 0904 by Laural Roes, RN  Safety Promotion/Fall Prevention:   nonskid shoes/slippers when out of bed   safety round/check completed  Intervention: Prevent Skin Injury  Recent Flowsheet Documentation  Taken 01/20/2024 0904 by Laural Roes, RN  Skin Protection: adhesive use limited  Intervention: Prevent and Manage VTE (Venous Thromboembolism) Risk  Recent Flowsheet Documentation  Taken 01/20/2024 0904 by Laural Roes, RN  VTE Prevention/Management: sequential compression devices off  Goal: Optimal Comfort and Wellbeing  Outcome: Ongoing (see interventions/notes)  Intervention: Provide Person-Centered Care  Recent Flowsheet Documentation  Taken 01/20/2024 0904 by Laural Roes, RN  Trust Relationship/Rapport:   care explained   choices provided   thoughts/feelings acknowledged  Goal: Rounds/Family Conference  Outcome: Ongoing (see interventions/notes)   POC reviewed and continued

## 2024-01-21 ENCOUNTER — Other Ambulatory Visit (HOSPITAL_BASED_OUTPATIENT_CLINIC_OR_DEPARTMENT_OTHER): Payer: Self-pay

## 2024-01-21 ENCOUNTER — Ambulatory Visit: Payer: 59 | Admitting: Physician Assistant

## 2024-01-21 ENCOUNTER — Ambulatory Visit (HOSPITAL_BASED_OUTPATIENT_CLINIC_OR_DEPARTMENT_OTHER)
Admission: RE | Admit: 2024-01-21 | Discharge: 2024-01-21 | Disposition: A | Discharge status: Home / Self Care | Payer: 59 | Source: Ambulatory Visit | Attending: Family Medicine | Admitting: Family Medicine

## 2024-01-21 ENCOUNTER — Ambulatory Visit (INDEPENDENT_AMBULATORY_CARE_PROVIDER_SITE_OTHER): Payer: 59 | Admitting: Family Medicine

## 2024-01-21 ENCOUNTER — Ambulatory Visit (INDEPENDENT_AMBULATORY_CARE_PROVIDER_SITE_OTHER): Payer: Self-pay | Admitting: Internal Medicine

## 2024-01-21 ENCOUNTER — Telehealth (INDEPENDENT_AMBULATORY_CARE_PROVIDER_SITE_OTHER): Payer: Self-pay | Admitting: Internal Medicine

## 2024-01-21 VITALS — BP 110/70 | HR 71 | Temp 98.0°F | Resp 18 | Ht 65.0 in | Wt 143.4 lb

## 2024-01-21 DIAGNOSIS — R051 Acute cough: Secondary | ICD-10-CM | POA: Diagnosis present

## 2024-01-21 DIAGNOSIS — J209 Acute bronchitis, unspecified: Secondary | ICD-10-CM

## 2024-01-21 MED ORDER — AMOXICILLIN-POT CLAVULANATE 875-125 MG PO TABS
1.0000 | ORAL_TABLET | Freq: Two times a day (BID) | ORAL | 0 refills | Status: DC
Start: 2024-01-21 — End: 2024-02-13
  Filled 2024-01-21: qty 20, 10d supply, fill #0

## 2024-01-21 NOTE — Consults (Signed)
-------------------------------------------------------------------------------  Attestation signed by [name], MD at 01/21/2024  5:56 PM  I personally saw and examined the patient and participated in the plan of care. See the resident's note and plan for details.    63 year old significantly comorbid gentleman with severe COPD who was transferred for gross hematuria found to have what appears to be a right lateral wall/UVJ bladder mass possibly involving the distal ureter.  Fortunately his hematuria is relatively mild and I suspect he will continue to be able to void spontaneously.  We will arrange elective TURBT and possible right ureteroscopy for diagnosis in the near future.    [name], MD   -------------------------------------------------------------------------------    UROLOGY CONSULT NOTE    Hector [name]  [id number]   15-Apr-1961    Date of Admission: 01/20/2024  Date of Consult: 01/21/2024  Reason for Consult: " Hector [name] hematuria with a soft tissue bladder mass on OSH imaging. "  Urology Attending: [name], MD    Primary Problem: Hematuria    Subjective    This is a 63 y.o. male with PMH HTN, HLD, alcoholic cirrhosis, and COPD (wears 5 L O2 at baseline) presenting 01/20/2024 as a transfer from [facility] hospital due to an initial presentation for hematuria over the past 2-3 months. Urology Consulted for gross hematuria and concern for bladder mass.     Patient reported that he began experiencing gross hematuria 2 months ago. It was intermittent at that time, so he did not seek medical treatment. However, over the last several weeks it became more consistent for which he scheduled a visit with his PCP who then recommended he present to a local ED. While at the outside hospital, CT AP was performed showing a soft tissue bladder mass as well as right sided hydronephrosis. A foley catheter was inserted at that time and CBI was initiated. Patient was then transferred to [facility] for urologic evaluation.      Today, patient the site at bedside.  Denies any prior urologic history or history of abdominal surgery.  Denies any baseline LUTS.  He reports that he worked in [employer] and did not encounter any significant occupational exposures.  He does endorse a 40 pack-year smoking history.  He denies any family history of bladder, renal, or prostate cancer.  He states that he has a sister that had an unspecified cancer.    Urine clear yellow this morning on slow drip CBI.    Review of Systems:   Full review of systems was completed and negative other then that noted in the HPI.    PMH  History reviewed. No pertinent past medical history.    PSH  History reviewed. No pertinent surgical history.    Family History  History reviewed. No pertinent family history.    Social History  Social History     Tobacco Use   . Smoking status: Every Day     Types: Cigarettes   . Smokeless tobacco: Never       Allergies  Allergies   Allergen Reactions   . Bee Sting Unknown       Medications    Current Facility-Administered Medications:   .  acetaminophen (TYLENOL) suppository 650 mg, 650 mg, rectal, Q4H PRN **OR** acetaminophen (TYLENOL) tablet 650 mg, 650 mg, oral, Q4H PRN, [name], DO  .  aspirin chewable tablet 81 mg, 81 mg, oral, Daily, [name], DO, 81 mg at 01/21/24 0836  .  fluticasone-salmeteroL (ADVAIR DISKUS,WIXELA INHUB) 500-50 mcg/dose inhaler 1 puff, 1 puff,  inhalation, BID, [name], DO  .  ipratropium-albuteroL (DUONEB) nebulizer solution 3 mL, 3 mL, nebulization, Q8H PRN, [name], DO  .  nicotine (NICODERM CQ) 21 mg/24 hr 1 patch, 1 patch, transdermal, Daily, [name], MD, 1 patch at 01/21/24 [phone]  .  OLANZapine (ZyPREXA) tablet 10 mg, 10 mg, oral, Nightly, [name], DO  .  pantoprazole (PROTONIX) EC tablet 40 mg, 40 mg, oral, Daily, [name], DO, 40 mg at 01/21/24 0836  .  rosuvastatin (CRESTOR) tablet 10 mg, 10 mg, oral, Daily, [name], DO, 10 mg at 01/21/24 0836  .   tamsulosin (FLOMAX) capsule 0.4 mg, 0.4 mg, oral, Daily, [name], DO, 0.4 mg at 01/21/24 0836  .  traZODone (DESYREL) tablet 50 mg, 50 mg, oral, Nightly, [name], DO    Physical Examination:  BP: (!) 142/77, HR 65, T: 36.6 C (97.9 F), weight: (!) 110.7 kg (244 lb 0.8 oz)     VS: as above, afebrile  GA: A&Ox3, NAD  Resp: Reg and non-labored on 5 L NC  CV: As above  GU:  Three-way Foley in place with clear yellow urine on slow drip CBI    Lab Review    CBC  Results from last 7 days   Lab Units 01/21/24  0237   WBC k/mcL 3.18*   HEMOGLOBIN g/dL 96.0*   HEMATOCRIT % 45.4*   PLATELETS k/mcL 70*       BMP  Results from last 7 days   Lab Units 01/21/24  0237   SODIUM mmol/L 141   POTASSIUM mmol/L 4.2   CHLORIDE mmol/L 107   CO2 mmol/L 26   BUN BLD mg/dL 9   CREATININE mg/dL 0.98       Urinalysis        Imaging:   OSH imaging reviewed    ASSESSMENT & PLAN:    This is a 63 y.o. male with PMH HTN, HLD, alcoholic cirrhosis, and COPD (wears 5 L O2 at baseline) presenting 01/20/2024 as a transfer from [facility] hospital due to an initial presentation for hematuria over the past 2-3 months. Urology Consulted for gross hematuria and concern for bladder mass.     Labs on exam show no leukocytosis, hemoglobin 11.5 without prior baseline.  No AKI, creatinine 0.84. Outside hospital CT imaging from 01/21 reveals what appears to be right posterolateral bladder wall mass versus blood products with mild upstream right hydroureteronephrosis with interval resolution on scan from 01/31 after Foley placement.    Unclear what condition urine was in prior to transfer; however, urine clear yellow this morning on slow drip CBI.  CBI clamped at bedside.    Plan:  No acute urologic interventions indicated at this time  Appropriate for diet from urologic standpoint  Will assess response to CBI clamp trial today and if urine remains clear we will plan for catheter removal and void trial tomorrow  Please continue to hold aspirin and  Plavix through at least CBI clamp trial  Ultimately will need outpatient cystoscopy and right diagnostic ureteroscopy with possible transurethral resection of bladder tumor and possible laser ablation of tumor  Office messaged to coordinate  Follow-up contact information added to AVS  Urology will continue to follow    Please notify Urology if there are any questions or concerns.    Ivan Anchors, MD  Urology PGY-1  Secure chat preferred, if urgent please page [phone]  01/21/2024 8:55 AM

## 2024-01-21 NOTE — Care Management Notes (Signed)
Patient transferred to [facility] on 2/5.  No SS needs at this time.

## 2024-01-22 NOTE — Progress Notes (Addendum)
Care Management Transition Summary Assessment    LOS day: 0    Expected Discharge Date:  01/22/2024    Transition Summary  Patient, family and/or caregiver expressed readiness, willingness and ability to provide or support self-management activities when needed: (P) Yes  Transition plan reviewed with: (P) Patient  Education provided regarding post-acute services: (P) Yes  Education Provided to: (P) Patient  Preferences identified regarding post-acute services: (P) Yes  Preferences identified by: (P) Patient  Goals Identified by: (P) Patient  Transitional plan confirmed with: (P) Patient  Patient/family/caregiver verbalized understanding of the Transition plan: (P) Yes  Post acute authorization obtained: (P) Not applicable  Transition Plan: (P) Home - self, without services  Transportation: (P) Wheel chair [employer]

## 2024-01-23 ENCOUNTER — Other Ambulatory Visit: Payer: Self-pay | Admitting: Family Medicine

## 2024-01-23 ENCOUNTER — Encounter: Payer: Self-pay | Admitting: Internal Medicine

## 2024-01-23 DIAGNOSIS — G629 Polyneuropathy, unspecified: Secondary | ICD-10-CM

## 2024-01-25 ENCOUNTER — Other Ambulatory Visit (HOSPITAL_BASED_OUTPATIENT_CLINIC_OR_DEPARTMENT_OTHER): Payer: Self-pay

## 2024-01-25 ENCOUNTER — Other Ambulatory Visit: Payer: Self-pay

## 2024-01-25 MED ORDER — ROSUVASTATIN CALCIUM 20 MG PO TABS
20.0000 mg | ORAL_TABLET | Freq: Every day | ORAL | 0 refills | Status: DC
  Filled 2024-01-25: qty 15, 15d supply, fill #0

## 2024-01-26 ENCOUNTER — Ambulatory Visit (INDEPENDENT_AMBULATORY_CARE_PROVIDER_SITE_OTHER): Payer: Self-pay | Admitting: Internal Medicine

## 2024-01-26 ENCOUNTER — Encounter (INDEPENDENT_AMBULATORY_CARE_PROVIDER_SITE_OTHER): Payer: Self-pay | Admitting: FAMILY MEDICINE

## 2024-01-27 NOTE — Telephone Encounter (Signed)
-----   Message from Benson Norway, MD sent at 01/21/2024  9:32 AM EST -----  Regarding: Outpatient surgery  Good morning,     This patient will need to be scheduled for outpatient surgery in the next 2-4 weeks: cystoscopy and right diagnostic ureteroscopy with possible transurethral resection of bladder tumor and possible laser ablation of tumor.    Thanks,     Faylene Million

## 2024-01-27 NOTE — Telephone Encounter (Signed)
Patient is going to [facility] hospital for his procedure.

## 2024-01-28 ENCOUNTER — Other Ambulatory Visit: Payer: Self-pay

## 2024-01-28 ENCOUNTER — Ambulatory Visit (HOSPITAL_BASED_OUTPATIENT_CLINIC_OR_DEPARTMENT_OTHER)
Admission: RE | Admit: 2024-01-28 | Discharge: 2024-01-28 | Disposition: A | Discharge status: Home / Self Care | Payer: MEDICAID | Source: Ambulatory Visit

## 2024-01-28 ENCOUNTER — Encounter (INDEPENDENT_AMBULATORY_CARE_PROVIDER_SITE_OTHER): Payer: Self-pay | Admitting: Internal Medicine

## 2024-01-28 ENCOUNTER — Ambulatory Visit: Payer: MEDICAID | Attending: Internal Medicine | Admitting: Internal Medicine

## 2024-01-28 VITALS — BP 160/60 | HR 63 | Temp 97.7°F | Ht 69.0 in | Wt 242.0 lb

## 2024-01-28 DIAGNOSIS — M159 Polyosteoarthritis, unspecified: Secondary | ICD-10-CM

## 2024-01-28 DIAGNOSIS — J9611 Chronic respiratory failure with hypoxia: Secondary | ICD-10-CM

## 2024-01-28 DIAGNOSIS — I6523 Occlusion and stenosis of bilateral carotid arteries: Secondary | ICD-10-CM | POA: Insufficient documentation

## 2024-01-28 DIAGNOSIS — N3289 Other specified disorders of bladder: Secondary | ICD-10-CM

## 2024-01-28 DIAGNOSIS — Z87448 Personal history of other diseases of urinary system: Secondary | ICD-10-CM

## 2024-01-28 DIAGNOSIS — J449 Chronic obstructive pulmonary disease, unspecified: Secondary | ICD-10-CM

## 2024-01-28 DIAGNOSIS — Z9981 Dependence on supplemental oxygen: Secondary | ICD-10-CM

## 2024-01-28 DIAGNOSIS — Z09 Encounter for follow-up examination after completed treatment for conditions other than malignant neoplasm: Secondary | ICD-10-CM | POA: Insufficient documentation

## 2024-01-28 MED ORDER — METHYLPREDNISOLONE ACETATE 80 MG/ML SUSPENSION FOR INJECTION
80.0000 mg | INTRAMUSCULAR | Status: AC
Start: 2024-01-28 — End: 2024-01-28
  Administered 2024-01-28: 80 mg via INTRAMUSCULAR

## 2024-01-28 NOTE — Patient Instructions (Signed)
Keep apt with urology     Take tylenol for pain

## 2024-01-28 NOTE — Progress Notes (Signed)
INTERNAL MEDICINE, [facility]  [address]  [place] [address]  Operated by [facility]  Transitional Care Management Note    Name: Rodgerick Gilliand MRN:  [id number]   Date: 01/28/2024 Age: 63 y.o.     Chief Complaint: Hospital Discharge Transition       SUBJECTIVE:  Hector [name] is a 63 y.o. male presenting today for follow-up after being discharged. The main problem requiring admission was .  Hematuria.  He was admitted to [facility] then transferred to [facility] where his urine cleared up and he was sent home on Myrbetriq for bladder spasms he is has a an appointment at [facility] for follow-up.  Today his main complaint is pain generalized over his neck and back and he has had Tylenol is not helping      OBJECTIVE:   There were no vitals taken for this visit.     Patient alert oriented x3 does not appear to be in any distress he is not wearing his oxygen     Neck no JVD no bruits     Lungs are clear     Heart S1-S2 no murmur     Abdomen soft nondistended nontender     Extremities no pedal edema    Transition of Care Contact Information  Discharge date: Discharge Date: 01/20/2024  Transition Facility Type--Hospital (Inpatient or Observation)  Facility Name--[facility] Interactive Contact(s):  Completed Contact: 01/21/2024  3:14 PM  Contact Method(s)-- Other (see notes)  Clinical Staff Name/Role who contacted--Edie Roll, LPN     Data Reviewed  Medication Reconciliation completed    Assessment and Plan    ICD-10-CM    1. Hospital discharge follow-up  Z09       Osteoarthritis of multiple joints , Depo-Medrol 80 mg IM x1 today.  He can take Tylenol for pain     Hematuria resolved right now he needs cystoscopy he has an appointment I told him to keep that appointment     chronic obstructive pulmonary disease with chronic hypoxic respiratory failure on oxygen told him to wear oxygen all the time    Call with any questions otherwise I will see him back in 3  months' time or before if needed.      Other transition actions (Optional) -: Discharge documentation was reviewed, Discharge documentation used to reconcile outpatient medication list, Pending tests or treatments were discussed with the patient-family-caregiver , Pending specialist referrals were discussed with the patient-family-caregiver , Education about medication(s) provided to patient/ family/ caregiver  , and Education provided to patient-family-caregiver about how to access care or advice     No follow-ups on file.    Robin Searing, MD

## 2024-02-01 ENCOUNTER — Other Ambulatory Visit: Payer: Self-pay

## 2024-02-01 ENCOUNTER — Encounter (HOSPITAL_BASED_OUTPATIENT_CLINIC_OR_DEPARTMENT_OTHER): Payer: Self-pay | Admitting: Adult Health

## 2024-02-01 ENCOUNTER — Ambulatory Visit: Payer: MEDICAID | Attending: Adult Health | Admitting: Adult Health

## 2024-02-01 DIAGNOSIS — R310 Gross hematuria: Secondary | ICD-10-CM | POA: Insufficient documentation

## 2024-02-01 DIAGNOSIS — N329 Bladder disorder, unspecified: Secondary | ICD-10-CM | POA: Insufficient documentation

## 2024-02-01 DIAGNOSIS — Z7902 Long term (current) use of antithrombotics/antiplatelets: Secondary | ICD-10-CM | POA: Insufficient documentation

## 2024-02-01 DIAGNOSIS — J449 Chronic obstructive pulmonary disease, unspecified: Secondary | ICD-10-CM | POA: Insufficient documentation

## 2024-02-01 DIAGNOSIS — Z720 Tobacco use: Secondary | ICD-10-CM | POA: Insufficient documentation

## 2024-02-01 DIAGNOSIS — Z9981 Dependence on supplemental oxygen: Secondary | ICD-10-CM | POA: Insufficient documentation

## 2024-02-01 DIAGNOSIS — N3289 Other specified disorders of bladder: Secondary | ICD-10-CM

## 2024-02-01 NOTE — Cancer Center Note (Signed)
UROLOGIC ONCOLOGY SURGERY NOTE    CHIEF COMPLAINT:   New patient for intermittent gross hematuria and small posterior bladder wall mass     SUBJECTIVE:  History of Present Illness    The patient presents to the clinic for evaluation of hematuria and a soft tissue density found in the bladder on recent imaging. He reports having blood in his urine, which prompted an emergency room visit and CT scan in January. The CT scan revealed a suspicious soft tissue density in the posterior aspect of the bladder on the right side, overlaying the ureteral orifice (the opening where the ureter from the kidney drains into the bladder). The patient has a history of hypertension, hyperlipidemia, chronic obstructive pulmonary disease (COPD), chronic respiratory failure requiring 3 liters of home oxygen, and cirrhosis of the liver. He is a current smoker. The patient denies any history of diabetes, heart attack, or stroke. Following the CT scan findings, the patient had a Foley catheter placed, which caused significant bladder spasms and discomfort. He eventually presented to another hospital for management of the bladder spasms. The catheter was subsequently removed, and the patient reports being able to urinate without difficulty currently. However, he continues to experience intermittent hematuria. The patient was taking Plavix (clopidogrel) prior to this episode but has held this medication as instructed while awaiting further evaluation and management of the bladder mass.            OBJECTIVE:  Current Outpatient Medications   Medication Sig    albuterol sulfate (VENTOLIN HFA) 90 mcg/actuation Inhalation oral inhaler Take 2 Puffs by inhalation Every 6 hours as needed for Other (SHORTNESS OF BREATH,COUGH OR WHEEZING)    fluticasone propion-salmeteroL (ADVAIR HFA) 230-21 mcg/actuation Inhalation oral inhaler Take 2 Puffs by inhalation Twice daily RINSE MOUTH WITH WATER AFTER INHALER USE    ipratropium-albuterol 0.5 mg-3 mg(2.5  mg base)/3 mL Solution for Nebulization TAKE 3 ML BY NEBULIZATION THREE TIMES A DAY AS NEEDED FOR WHEEZING    mirabegron (MYRBETRIQ) 25 mg Oral Tablet Sustained Release 24 hr Take 1 Tablet (25 mg total) by mouth Once a day for 30 days    OLANZapine (ZYPREXA) 10 mg Oral Tablet Take 1 Tablet (10 mg total) by mouth Every night    oxygen (O2) Inhalation gas Administer 4 L/min into affected nostril(s) continuous    pantoprazole (PROTONIX) 40 mg Oral Tablet, Delayed Release (E.C.) TAKE 1 TABLET (40 MG TOTAL) BY MOUTH TWICE A DAY 30 MINTUTES BEFORE MEALS FOR 90 DAYS    rosuvastatin (CRESTOR) 10 mg Oral Tablet TAKE 1 TABLET BY MOUTH EVERY DAY IN THE EVENING    traZODone (DESYREL) 50 mg Oral Tablet Take 1 Tablet (50 mg total) by mouth Every night Unsure dosage     BP 138/66   Pulse 61   Temp 36.6 C (97.9 F)   Resp 20   Ht 1.74 m (5' 8.5")   Wt 105 kg (230 lb 11.2 oz)   SpO2 93%   BMI 34.57 kg/m       General: Patient is vitally stable (see values). Appears calm and at rest. Dressed appropriately.    Skin: Warm and dry.     Eyes: Eyes are clear.    Pulmonary: Respiratory effort is unlabored.     Psychiatric: Patient is alert, appropriate mood, and in no acute distress.     Musculoskeletal: Patient ambulates without assistance   Cardiovascular: Palpable peripheral pulses.    Neurologic: Neurological exam is consistent with patient's age.  Gastrointestinal: Abdomen soft supple, non-tender. No CVAT   Genitourinary: Physical Exam               ASSESSMENT:    Assessment & Plan     Assessment:  Incidental gross hematuria having presented to [facility] with CT IVP (12/2023)  concerning for a soft tissue density posterior bladder at the right UVJ       Plan:  Urine culture today.  Plan on sedated cystoscopy with TURBT random bladder biopsies and retrograde pyelogram as needed for a complete disease assessment.  Given his significant COPD and requirements of 3 L of at home nasal oxygen, he'll need perioperative  evaluation clearance.  Patient has already self stopped his Plavix while at the hospital, but he'll require optimization prior to this procedure.  I can follow up with him on a telephone call post procedure to review pathology and.             ATTESTATION:  Isaias Sakai, APRN,AGPCNP-BC

## 2024-02-03 ENCOUNTER — Encounter (HOSPITAL_BASED_OUTPATIENT_CLINIC_OR_DEPARTMENT_OTHER): Payer: Self-pay | Admitting: Adult Health

## 2024-02-03 ENCOUNTER — Ambulatory Visit (HOSPITAL_BASED_OUTPATIENT_CLINIC_OR_DEPARTMENT_OTHER): Payer: Self-pay | Admitting: Adult Health

## 2024-02-03 ENCOUNTER — Encounter (INDEPENDENT_AMBULATORY_CARE_PROVIDER_SITE_OTHER): Payer: Self-pay | Admitting: VASCULAR SURGERY

## 2024-02-03 ENCOUNTER — Other Ambulatory Visit: Payer: Self-pay

## 2024-02-03 ENCOUNTER — Ambulatory Visit: Payer: MEDICAID | Attending: VASCULAR SURGERY | Admitting: VASCULAR SURGERY

## 2024-02-03 ENCOUNTER — Encounter (HOSPITAL_COMMUNITY): Payer: Self-pay

## 2024-02-03 VITALS — BP 120/65 | HR 60

## 2024-02-03 DIAGNOSIS — N390 Urinary tract infection, site not specified: Secondary | ICD-10-CM

## 2024-02-03 DIAGNOSIS — I6523 Occlusion and stenosis of bilateral carotid arteries: Secondary | ICD-10-CM | POA: Insufficient documentation

## 2024-02-03 LAB — URINE CULTURE: URINE CULTURE: >100000 — AB

## 2024-02-03 MED ORDER — NITROFURANTOIN MONOHYDRATE/MACROCRYSTALS 100 MG CAPSULE
100.0000 mg | ORAL_CAPSULE | Freq: Two times a day (BID) | ORAL | 0 refills | Status: DC
Start: 2024-02-03 — End: 2024-02-29

## 2024-02-03 NOTE — Progress Notes (Signed)
[facility]   OUTPATIENT CLINIC PROGRESS NOTE    Date: 02/03/2024  Patient: Hector [name]  DOB: 10-28-1961  PCP: Robin Searing, MD    CHIEF COMPLAINT:   Chief Complaint   Patient presents with    Follow Up     FOLLOW UP  BILATERAL CAROTID ARTERY STENOSIS  DUPLEX 01/28/24       HPI:   Hector [name] is a 63 y.o. White male who presents for follow-up of carotid disease.  He has a history of a  right TCAR performed on June 29, 2023. He returns to the office today in his accompanied by his wife.  He tells me that he is doing well and has no complaints. He denies any symptoms of amaurosis, speech disturbance, facial droop or unilateral extremity paresthesia or weakness.    The patient denies any new changes in PMHx, PSxH, Social Hx, FHx or current medications.     REVIEW OF SYSTEMS:  Constitutional: negative for fevers, chills, sweats and fatigue  Eyes: negative for blurred vision or field defects  HEENT: negative for hearing loss  Respiratory: negative for cough and shortness of breath  Cardiovascular: negative for chest pain, negative for claudication  Gastrointestinal: negative for post prandial pain  Genitourinary: negative for dysuria, continues to urinate  Musculoskeletal: As in HPI  Neurological: negative for unilateral weakness or monocular blindness  Integumentary: negative for wounds or ulcers    PHYSICAL EXAM:  Vitals: BP 120/65 (Site: Left Arm, Patient Position: Sitting)   Pulse 60   SpO2 94%     General: AA&O X3. Nontoxic.  Well developed and well nourished in no acute distress   HENT: Head is normocephalic, atraumatic   Eyes: Pupils equal and round and reactive to light; conjunctiva clear. Sclera without icterus; EOMO grossly intact.    Neck: Normal ROM, Supple, symmetrical  Lungs: Effort normal, clear to auscultation bilaterally.   Cardiovascular: Heart regular rate and rhythm, no murmur, rub or gallop  Abdomen: Bowel sounds normal; soft, non distended non-tender to palpation, no rebound or  guarding present. No palpable masses.  Extremities: no cyanosis or edema  Skin:  Skin warm and dry    Vascular:      His right neck incision is well healed.    Right carotid artery: 2+ (normal) Left carotid artery: 2+ (normal)   Right brachial artery: 2+ (normal) Left brachial artery: 2+ (normal)   Right radial artery:  2+ (normal) Left radial artery: 2+ (normal)     DIAGNOSTIC STUDIES REVIEWED:  I have independently reviewed and interpreted available imaging.  My interpretations are summarized below.    I personally reviewed his carotid duplex images performed on January 28, 2024.  The right carotid stent is widely patent without any evidence of stenosis.  The left internal carotid artery has a less than 50% stenosis.  The bilateral vertebral arteries are not visualized on this exam    ASSESSMENT:  Hector [name] is doing well with respect to his carotid disease at this time.  He has a history of a right TCAR performed 6 months ago.  He remains asymptomatic.    PLAN:  We will plan to see him back in the office in 1 year with repeat carotid studies.  Ideally he should beyond an antiplatelet at this time, however I believe it is on hold secondary to his ongoing workup for a bladder mass.  Recommend restarting this medication when able.    The patient was given the  opportunity to ask questions and those questions were answered to their satisfaction. Instructed to call with any further questions or concerns.     Lillia Mountain., MD  [facility]

## 2024-02-04 ENCOUNTER — Encounter (HOSPITAL_COMMUNITY): Payer: Self-pay | Admitting: Student in an Organized Health Care Education/Training Program

## 2024-02-04 ENCOUNTER — Inpatient Hospital Stay (HOSPITAL_COMMUNITY)
Admission: RE | Admit: 2024-02-04 | Discharge: 2024-02-04 | Disposition: A | Discharge status: Home / Self Care | Payer: MEDICAID | Source: Ambulatory Visit

## 2024-02-04 ENCOUNTER — Encounter (HOSPITAL_COMMUNITY): Payer: Self-pay

## 2024-02-04 HISTORY — DX: Personal history of urinary (tract) infections: Z87.440

## 2024-02-08 ENCOUNTER — Other Ambulatory Visit: Payer: Self-pay

## 2024-02-08 ENCOUNTER — Other Ambulatory Visit (HOSPITAL_BASED_OUTPATIENT_CLINIC_OR_DEPARTMENT_OTHER): Payer: Self-pay

## 2024-02-08 ENCOUNTER — Telehealth: Payer: Self-pay | Admitting: Internal Medicine

## 2024-02-08 MED ORDER — ROSUVASTATIN CALCIUM 20 MG PO TABS
20.0000 mg | ORAL_TABLET | Freq: Every day | ORAL | 0 refills | Status: DC
  Filled 2024-02-08: qty 14, 14d supply, fill #0

## 2024-02-08 NOTE — Telephone Encounter (Signed)
 *  STAT* If patient is at the pharmacy, call can be transferred to refill team.   1. Which medications need to be refilled? (please list name of each medication and dose if known)   rosuvastatin (CRESTOR) 20 MG tablet     2. Would you like to learn more about the convenience, safety, & potential cost savings by using the [facility]? No      3. Are you open to using the [facility] (Type [facility]. No    4. Which pharmacy/location (including street and city if local pharmacy) is medication to be sent to? [facility] - [place], [place] - [address] [place]    5. Do they need a 30 day or 90 day supply? 90 days

## 2024-02-08 NOTE — Telephone Encounter (Signed)
 Sent 14 day supply to [facility]. We will send new refill to [facility] after appt in February 2025.

## 2024-02-09 NOTE — Progress Notes (Unsigned)
 Cardiology Office Note:    Date:  02/10/2024   ID:  Curtis Luna, DOB May 02, 1961, MRN [id number]  PCP:  Pearline Cables, MD   [facility] Providers Cardiologist:  Parke Poisson, MD Cardiology APP:  Marcelino Duster, PA     Referring MD: Pearline Cables, MD   Chief Complaint  Patient presents with   Follow-up    HLD    History of Present Illness:    Curtis Luna is a 63 y.o. male with a hx of ocular migraines, nonobstructive CAD, PACs, hyperlipidemia, GERD, thyroid nodules, hyperlipidemia.  He has a history of bradycardia and hypotension not on metoprolol.  Coronary CTA showed CAC score 213 placing him at the 79th percentile.  He did not tolerate increased doses of pravastatin.  Therefore switched to 20 mg Crestor.  He presents for routine follow-up and medication refill. He has no cardiac complaints, but wants to stop crestor to see if this helps his neuropathy. We discussed a drug holiday. I review his recent labs which show low LPA and ApoB.   Past Medical History:  Diagnosis Date   Abdominal pain, right lower quadrant    Acute bronchospasm    Allergy    Anxiety    on and off   Backache, unspecified    Chicken pox    Diverticulosis of colon (without mention of hemorrhage)    Esophageal reflux    External hemorrhoids without mention of complication    thrombosed   Hiatal hernia    Hyperlipidemia    no medicines    Inguinal hernia without mention of obstruction or gangrene, unilateral or unspecified, (not specified as recurrent)    Migraine    Neuropathy    Obstructive sleep apnea (adult) (pediatric)    uses oral appliance   Other symptoms involving digestive system(787.99)    Pain in joint, forearm    Pain in joint, shoulder region    Seasonal allergies    Sleep apnea    no cpap but uses dental device    Thoracic or lumbosacral neuritis or radiculitis, unspecified     Past Surgical History:  Procedure Laterality Date   COLON  RESECTION  10/06   sigmoid colon   COLONOSCOPY     INGUINAL HERNIA REPAIR  12/2012   Left   UPPER GASTROINTESTINAL ENDOSCOPY     WISDOM TOOTH EXTRACTION      Current Medications: Current Meds  Medication Sig   amoxicillin-clavulanate (AUGMENTIN) 875-125 MG tablet Take 1 tablet by mouth 2 (two) times daily.   Ascorbic Acid (VITAMIN C) 1000 MG tablet Take 1,000 mg by mouth daily.   b complex vitamins tablet Take 1 tablet by mouth daily. Every other day   cholecalciferol (VITAMIN D3) 25 MCG (1000 UT) tablet Take 1,000 Units by mouth daily.   Famotidine (PEPCID PO) Take by mouth as needed.   gabapentin (NEURONTIN) 100 MG capsule TAKE 1 CAPSULE BY MOUTH 3 TIMES  DAILY   LORazepam (ATIVAN) 1 MG tablet Take 1 tablet (1 mg total) by mouth as needed for anxiety. May take 1 per day   [DISCONTINUED] rosuvastatin (CRESTOR) 20 MG tablet Take 1 tablet (20 mg total) by mouth daily.     Allergies:   Escitalopram, Influenza vaccines, and Protonix [pantoprazole sodium]   Social History   Socioeconomic History   Marital status: Married    Spouse name: Not on file   Number of children: Not on file   Years of education: Not  on file   Highest education level: Associate degree: occupational, Scientist, product/process development, or vocational program  Occupational History   Occupation: Community education officer   Tobacco Use   Smoking status: Former    Current packs/day: 0.00    Types: Cigarettes, Cigars    Quit date: 10/04/1987    Years since quitting: 36.3   Smokeless tobacco: Never  Vaping Use   Vaping status: Never Used  Substance and Sexual Activity   Alcohol use: Yes    Alcohol/week: 14.0 standard drinks of alcohol    Types: 14 Standard drinks or equivalent per week    Comment: 2 daily    Drug use: No   Sexual activity: Not on file  Other Topics Concern   Not on file  Social History Narrative   1 caffeine drink daily  Lives with wife in a one story home.  Has 2 children.  Works in Actuary.   Social  Drivers of Corporate investment banker Strain: Low Risk  (01/05/2024)   Overall Financial Resource Strain (CARDIA)    Difficulty of Paying Living Expenses: Not hard at all  Food Insecurity: No Food Insecurity (01/05/2024)   Hunger Vital Sign    Worried About Running Out of Food in the Last Year: Never true    Ran Out of Food in the Last Year: Never true  Transportation Needs: No Transportation Needs (01/05/2024)   PRAPARE - Administrator, Civil Service (Medical): No    Lack of Transportation (Non-Medical): No  Physical Activity: Insufficiently Active (01/05/2024)   Exercise Vital Sign    Days of Exercise per Week: 5 days    Minutes of Exercise per Session: 20 min  Stress: No Stress Concern Present (01/05/2024)   [employer] of Occupational Health - Occupational Stress Questionnaire    Feeling of Stress : Not at all  Social Connections: Moderately Integrated (01/05/2024)   Social Connection and Isolation Panel [NHANES]    Frequency of Communication with Friends and Family: Twice a week    Frequency of Social Gatherings with Friends and Family: Once a week    Attends Religious Services: 1 to 4 times per year    Active Member of [employer] or Organizations: No    Attends Engineer, structural: Not on file    Marital Status: Married     Family History: The patient's family history includes Allergies in his daughter; Aneurysm in his sister; Diabetes in his maternal aunt; Healthy in his brother; Heart attack in his paternal grandmother; Heart disease in his paternal grandmother; Lung cancer (age of onset: 15) in his mother; Migraines in his daughter; Neuropathy in an other family member; Other in his father. There is no history of Colon cancer, Colon polyps, Esophageal cancer, Rectal cancer, Stomach cancer, Pancreatic cancer, Allergic rhinitis, Angioedema, Asthma, Atopy, Eczema, Immunodeficiency, or Urticaria.  ROS:   Please see the history of present illness.     All  other systems reviewed and are negative.  EKGs/Labs/Other Studies Reviewed:    The following studies were reviewed today:  CT coronary       Recent Labs: 03/16/2023: BUN 21; Creatinine, Ser 0.97; Hemoglobin 14.3; Platelets 228; Potassium 4.3; Sodium 139  Recent Lipid Panel    Component Value Date/Time   CHOL 134 01/29/2023 0721   CHOL 154 10/12/2020 0902   TRIG 79.0 01/29/2023 0721   HDL 53.20 01/29/2023 0721   HDL 52 10/12/2020 0902   CHOLHDL 3 01/29/2023 0721   VLDL 15.8 01/29/2023 0721  LDLCALC 65 01/29/2023 0721   LDLCALC 83 10/12/2020 0902   LDLDIRECT 146 (H) 10/17/2014 0930     Risk Assessment/Calculations:                Physical Exam:    VS:  BP (!) 102/56   Pulse 81   Ht 5\' 5"  (1.651 m)   Wt 142 lb (64.4 kg)   SpO2 97%   BMI 23.63 kg/m     Wt Readings from Last 3 Encounters:  02/10/24 142 lb (64.4 kg)  01/21/24 143 lb 6.4 oz (65 kg)  01/07/24 150 lb (68 kg)     GEN:  Well nourished, well developed in no acute distress HEENT: Normal NECK: No JVD; No carotid bruits LYMPHATICS: No lymphadenopathy CARDIAC: RRR, no murmurs, rubs, gallops RESPIRATORY:  Clear to auscultation without rales, wheezing or rhonchi  ABDOMEN: Soft, non-tender, non-distended MUSCULOSKELETAL:  No edema; No deformity  SKIN: Warm and dry NEUROLOGIC:  Alert and oriented x 3 PSYCHIATRIC:  Normal affect   ASSESSMENT:    1. Coronary artery disease involving native coronary artery of native heart without angina pectoris   2. Hyperlipidemia with target LDL less than 70   3. Palpitations    PLAN:    In order of problems listed above:  Moderate nonobstructive CAD - Continue 81 mg aspirin, would like to stop 20 mg Crestor - No angina -- he remains active   Hyperlipidemia with LDL goal less than 70 Lipids 01/2023: Total chol 134 HDL 53 LDL 65 Trig 79 - has been on 20 mg rosuvastatin-he believes this is contributing to his neuropathy -- discussed a drug holiday and  referral to pharmD He shows me a Function Health panel drawn on/about 11/29/23: A1c 5.8% LDL 54 HDL 48 LPA less than 10 Total cholesterol 119 Triglycerides 83 ApoB 57 - he will stop crestor and redraw labs in 3 months. If above goal, we will refer to pharmD for PCSK9i. LPA and ApoB reassuring   Palpitations - Low risk cardiac monitor - Did not tolerate metoprolol due to hypotension - Avoiding triggers -- no issues with palpitations recently      Follow up with Dr. Jacques Navy in 1 year.      Medication Adjustments/Labs and Tests Ordered: Current medicines are reviewed at length with the patient today.  Concerns regarding medicines are outlined above.  Orders Placed This Encounter  Procedures   Lipid panel   No orders of the defined types were placed in this encounter.   Patient Instructions  Medication Instructions:  Stop Crestor as directed   Testing/Procedures: Fasting Lipid panel  Follow-Up: At [facility], you and your health needs are our priority.  As part of our continuing mission to provide you with exceptional heart care, we have created designated Provider Care Teams.  These Care Teams include your primary Cardiologist (physician) and Advanced Practice Providers (APPs -  Physician Assistants and Nurse Practitioners) who all work together to provide you with the care you need, when you need it.  We recommend signing up for the patient portal called "MyChart".  Sign up information is provided on this After Visit Summary.  MyChart is used to connect with patients for Virtual Visits (Telemedicine).  Patients are able to view lab/test results, encounter notes, upcoming appointments, etc.  Non-urgent messages can be sent to your provider as well.   To learn more about what you can do with MyChart, go to [email or web address].    Your next appointment:   1  year(s)  Provider:   Parke Poisson, MD             Signed, Roe Rutherford Lisseth Brazeau, PA   02/10/2024 2:09 PM    [facility]

## 2024-02-10 ENCOUNTER — Encounter: Payer: Self-pay | Admitting: Physician Assistant

## 2024-02-10 ENCOUNTER — Ambulatory Visit: Payer: 59 | Attending: Physician Assistant | Admitting: Physician Assistant

## 2024-02-10 ENCOUNTER — Other Ambulatory Visit (HOSPITAL_BASED_OUTPATIENT_CLINIC_OR_DEPARTMENT_OTHER): Payer: Self-pay

## 2024-02-10 VITALS — BP 102/56 | HR 81 | Ht 65.0 in | Wt 142.0 lb

## 2024-02-10 DIAGNOSIS — R002 Palpitations: Secondary | ICD-10-CM | POA: Diagnosis not present

## 2024-02-10 DIAGNOSIS — I2510 Atherosclerotic heart disease of native coronary artery without angina pectoris: Secondary | ICD-10-CM

## 2024-02-10 DIAGNOSIS — E785 Hyperlipidemia, unspecified: Secondary | ICD-10-CM

## 2024-02-10 NOTE — Patient Instructions (Addendum)
 Medication Instructions:  Stop Crestor as directed   Testing/Procedures: Fasting Lipid panel  Follow-Up: At [facility], you and your health needs are our priority.  As part of our continuing mission to provide you with exceptional heart care, we have created designated Provider Care Teams.  These Care Teams include your primary Cardiologist (physician) and Advanced Practice Providers (APPs -  Physician Assistants and Nurse Practitioners) who all work together to provide you with the care you need, when you need it.  We recommend signing up for the patient portal called "MyChart".  Sign up information is provided on this After Visit Summary.  MyChart is used to connect with patients for Virtual Visits (Telemedicine).  Patients are able to view lab/test results, encounter notes, upcoming appointments, etc.  Non-urgent messages can be sent to your provider as well.   To learn more about what you can do with MyChart, go to [email or web address].    Your next appointment:   1 year(s)  Provider:   Parke Poisson, MD

## 2024-02-10 NOTE — H&P (Unsigned)
 Addendum: none      Perioperative Evaluation Center  Department of Anesthesiology  Optimization Visit  History and Physical     Hector, [name], 63 y.o. male Medical Record Number: [id number]   Date of Birth:  Oct 14, 1961 Date of Service:  02/10/2024    Information Obtained from: patient Surgeon:  Surgeons and Role:     Aviva Signs, MD - Primary     Date of Surgery: 02/24/2024  Planned anesthesia: General   Estimated Case Length: 140 Min       TELEMEDICINE DOCUMENTATION:    Patient Location:  MyChart video visit from home address: [address]  [place] [address]    Patient/family aware of provider location:  yes  Patient/family consent for telemedicine:  yes  Examination observed and performed by:  Sander Radon, FNP-C      CHIEF COMPLAINT  Pre-Operative Optimization Visit for CYSTOSCOPY WITH RETROGRADES: 52005 (CPT)  PYELOGRAM: 74400 (CPT)  CYSTOSCOPY WITH BLADDER BIOPSY: 52204 (CPT)  RESECTION TUMOR BLADDER TRANSURETHRAL: 52240 (CPT)  under General due to  ureteral stricture.        RECOMMENDATION  SUMMARY: The surgery is considered urgent [weeks to short months].  The surgery is considered intermediate intensity .  Recommend  proceeding towards surgery.    Patient has the following outstanding needs for optimization:  None  Patient would benefit from active incentive spirometer, early ambulation following surgery and multi-modal pain management approach.  DVT prophylaxis as appropriate.  Please see addendum for any updates after 02/10/2024.          ASSESSMENT & PLAN  Preoperative exam- scheduled for CYSTOSCOPY WITH RETROGRADES: 44010 (CPT)  PYELOGRAM: 74400 (CPT)  CYSTOSCOPY WITH BLADDER BIOPSY: 52204 (CPT)  RESECTION TUMOR BLADDER TRANSURETHRAL: 52240 (CPT)  Plan-    Recommend proceeding with procedure as scheduled.    - EKG 01/05/2024  - CT CAP 01/05/2024.  - Labs reviewed.     Ureteral stricture, bladder tumor- Admitted 2/5-2/7 for hematuria and had a suspicious bladder mass found on imaging.  Reports hematuria has improved.  Plan-  Continue following with Urology.     COPD-  Denies recent change in respiratory status, exacerbations, URI, or pneumonia. Reports home O2 was recently decreased from 5 to 3 L NC. Reports it is ordered continuously but he wears it at night and with activity. Reports requiring albuterol inhaler rarely.  Last PFT's 03/10/21. Follows w/ Pulmonary.  Plan-  Continue albuterol, Advair inhalers, DuoNebs.     Carotid Stenosis- s/p right carotid stent placement 06/2023.  01/28/2024 carotid duplex showed no evidence of hemodynamically significant stenosis to bilateral ICA as with widely patent right ICA stent Denies syncope, stroke-like symptoms. Following with Vascular Surgery.  Plan- continue Crestor. Per recent discharge summary, patient was to resume aspirin and Plavix. Patient held these medications because he was concerned about hematuria. Last Vascular Surgery note states patient can hold if needed, but recommend restarted as soon as possible. Dr. Mellissa Kohut recommended patient hold ASA and Plavix for 7 days preoperatively but can restart in the interim. Will notify patient of recommendations.     CAD- Denies history of MI or coronary stents.  CT chest 01/05/2024 notes that coronary artery calcifications are present, but no coronary calcium score is given.  No wall abnormalities noted on 05/26/2023 TTE.   Denies CP. Reports stable DOE. METS <4, which he reports is unchanged since the time of his last TTE.    Plan- EKG reviewed. Continue Crestor. Preoperative medication instructions provided.  Pulmonary HTN-  TTE 05/26/2023 showed an EF of 55-60%, RVSP consistent with mild pulmonary hypertension, no significant valvular disease noted. No prior right heart cath.   Plan- Recommend proceeding with surgery as scheduled given unchanged functional status since last TTE.      GERD- stable. Controlled.   Plan- Continue Protonix.     Cirrhosis-  Reports intermittent abdominal pain.  Denies ascites, jaundice, or previous paracentesis.  Following with GI.  Plan- CMP reviewed.    Anemia, thormbocytopenia- 01/22/24: HGB stable at 11.9. platelets stable at 66.  Reports hematuria has improved. Denies previous transfusion, blood in stool, dizziness, fatigue.  Plan- CBC reviewed.     Tobacco Use- Patient reports smoking 1 ppd, 40 years. Patient is not interested in quitting at this time.   Plan- Recommend cessation. Instructed to hold tobacco products for at least 8 hours prior to surgery. Refused [facility]oking Cessation [facility] referral.     Marijuana Use- Patient reports recreational marijuana use daily.   Plan-  Patient instructed to hold recreational marijuana at least 24 hours prior to surgery.                 MEDICATION INSTRUCTIONS    Patient instructed to take the following medications the morning of surgery: Tylenol if needed, inhalers nebulizers as prescribed, mirabegron, Protonix    Aspirin, Plavix- recommend restarting medications starting today. Hold both aspirin and Plavix for 7 days prior to surgery per surgical service.     Patient instructed to hold NSAIDs (Motrin, Advil, Ibuprofen, Aleve, Naprosyn, Meloxicam, Diclofenac, etc.), vitamins, fish oil, and herbal supplements 7 days prior to the procedure.      DIET INSTRUCTIONS   Regular diet until 8 hours prior to arrival. Clear liquids until 3 hours prior to procedure/surgery. Please drink up to 20 oz of Gatorade, Powerade, or Pedialyte up to three hours before procedure/surgery (No red colored drinks). Drinks must be finished 3 hours before scheduled procedure/surgery. You are to have nothing by mouth 3 hours prior to your scheduled procedure/surgery.  No tobacco/nicotine or mints for 8 hours prior to arrival.               Operative Reference Anesthesia  ASA  3    METS  <4    History of difficult intubation No    Personal or family history of anesthesia problems No    OSA (STOP-BANG score) No      Operative Reference  Surgery  Allergy to the following: latex, metal/nickel, iodine, acryclic No    Recent Hospitalization (within the last 3 months) 2/5-2/7 for hematuria    Past history of DVT/PE No    Current Tobacco Use  1 PPD for 40 years    Current Alcohol Use No    Current Drug Use  Daily marijuana use      Assessment Tools   Hx of Lung Disease     Patient is at intermediate risk for perioperative pulmonary complications.      Concern for Cardiac Ischemia        History of Liver Disease   Predicted Postoperative Outcomes by the VOCAL-Penn Score:      30-day mortality: 2.6%      90-day mortality: 4.8%      180-day mortality: 7.6%      90-day decompensation: 9.6%       Frailty Score Yes: Frailty Score 5    Reference:     [x]  F= Fatigue in the past month? (More unusual fatigue  than normal or inability to complete ADLs)  [x]  R= Resistance- difficulty climbing a flight of stairs?   [x]  A= Ambulation- difficulty walking one block on level land?  [x]   I= Illness (HTN, DM, Cancer (other than minor skin CA), Chronic Lung Disease, Heart Attack, CHF, Angina, Asthma, Arthritis, Stroke, CKD); 5+=1 point; <5=0  [x]   L= Loss of weight- have you lost >5% of your body weight in the past year?       STOP-BANG:       08/26/2021    11:49 AM 09/30/2022    10:05 AM 09/30/2022    10:26 AM 06/22/2023    10:28 AM 10/29/2023     9:23 AM   Stop Bang   Have you been Diagnosed with SLEEP APNEA (if yes do not need to continue Stop Bang) No No  No No   SNORING - do you snore loudly? (louder than talking or loud enough to be heard through closed doors) 1  1 1     TIRED - do you often feel tired, fatigued, or sleepy during daytime? 0  1 1    OBSERVED - has anyone observed you stop breathing during your sleep? 0  0 0    BLOOD PRESSURE - Do you have or are you being treated for high blood pressure? 0  1 1    BMI - greater than 35 kg/m2 ? 0 kg/m2  1 kg/m2 1 kg/m2    AGE - over 63 yr old? 1  1 1     NECK CIRCUMFERENCE - greater than 40 cm? 1  0 1    GENDER - Male? 1  1 1      STOP score 1  3 3     BANG score 3  3 4     STOP BANG score 4  6 7     Stop Bang Risk High Risk  High Risk High Risk               History of Present Illness:  Hector [name] is a 63 y.o. male that presents with need for pre-operative optimization.      Past Medical History:  Past Medical History:   Diagnosis Date    Alcohol abuse     Carotid stenosis     right side stent    Chronic obstructive airway disease (CMS HCC)     Cirrhosis (CMS HCC)     Closed fracture of transverse process of thoracic vertebra (CMS HCC) 05/01/2021    Coma (CMS HCC)     after fall due to alcholism May 2022    Delirium, withdrawal, alcoholic (CMS HCC)     Depression     Encephalopathy 05/15/2021    Esophageal reflux     H/O urinary tract infection     1/25    HTN (hypertension)     Hyperlipidemia     Ileus (CMS HCC) 05/18/2021    Left pulmonary contusion 05/01/2021    Leukopenia 05/18/2021    Multiple rib fractures 05/01/2021    Oxygen dependent     Pleural effusion 05/11/2021    Serum ammonia increased (CMS HCC) 05/15/2021    Shortness of breath     Spleen injury 05/01/2021    Status post thoracentesis 05/19/2021    Thrombocytopenia (CMS HCC) 05/01/2021    Unknown cause of injury     fx nose    Wears glasses      Past Surgical History:   Procedure Laterality Date    CAROTID STENT  COLONOSCOPY  08/2021    ESOPHAGOGASTRODUODENOSCOPY  08/2021    HX OTHER Right 06/29/2023    TCAR    HX TONSILLECTOMY      OTHER SURGICAL HISTORY      HX of plastic surgery to right head     Family Medical History:       Problem Relation (Age of Onset)    Asthma Father    Cerebral Aneurysm Sister    Dementia Mother    Heart Attack Paternal Grandmother (50)              ROS:  Review of Systems   Constitutional: Negative.   Skin:  Positive for easy bruising.   HENT: Positive for dental problems (poor dentition), hearing loss and visual problem (wears glasses).   Exercise Tolerance: Positive for less than 4 METS (limited due to back pain and fatigue, unchanged  since last ECHO).   Cardiovascular:  Positive for edema (Reports stable since last ECHO). Negative for chest pain, palpitations, orthopnea and paroxysmal nocturnal dyspnea.   Respiratory:  Positive for oxygen dependent (Wears 3 L NC at night and as needed) and shortness of breath (with exertion). Negative for cough, shortness of breath (at rest) and wheezing.    Gastrointestinal:  Positive for abdominal pain (occasional intermittent). Negative for heartburn, diarrhea, constipation, bowel incontinence and blood in stool.   Genitourinary:  Positive for hematuria (occasional hematuria that has improved).   Musculoskeletal:  Positive for neck pain, back pain, joint pain and musculoskeletal pain.   Family Anesthesia History: Negative for malignant hyperthermia precautions and pseudocholinesterase deficiency.   Anesthesia: Negative for complications, difficult intubation, difficult IV access, motion sickness and PONV.   Neurological:  Positive for dizziness (with standing quickly) and memory loss. Negative for headaches, numbness, tremors, syncope and muscle weakness.   Psychiatric/Behavioral:  Positive for depression.        Exam:  There were no vitals taken for this visit.      There is no height or weight on file to calculate BMI.    Anesthesia Exam:   Mallampati:  not assessed   Mouth Opening:  good   Neck Mobility: normal    Other HENT Concerns: poor dentition     Physical Exam  Constitutional:       Appearance: Normal appearance. He is obese.   HENT:      Head: Normocephalic and atraumatic.      Right Ear: External ear normal.      Left Ear: External ear normal.      Nose: Nose normal.      Mouth/Throat:      Mouth: Mucous membranes are moist.      Pharynx: Oropharynx is clear.   Pulmonary:      Effort: Pulmonary effort is normal.   Musculoskeletal:         General: Edema (Reports stable since last ECHO) present.   Neurological:      General: No focal deficit present.      Mental Status: He is alert and oriented to  person, place, and time. Mental status is at baseline.   Psychiatric:         Mood and Affect: Mood normal.         Behavior: Behavior normal.         Thought Content: Thought content normal.         Judgment: Judgment normal.               Diagnostics included will  be reviewed  and followed up as determined necessary prior to procedure             CONSULTS: Dr. Alcus Dad            Disclaimer: This note was partially created using voice recognition software and is inherently subject to errors including those of syntax and "sound alike" substitutions which may escape proof reading. In these instances, original meaning may be extrapolated by the contextual derivation.             Sander Radon, FNP-C  02/10/2024, 17:12    Perioperative Evaluation Center  [facility]  [place]    It should be emphasized that the [facility] Provider consultation is to assess the patient's medical conditions and provide recommendations to optimize the patient to decrease peri-operative complications.  Decision on whether or not to proceed to surgery is made at the care team's discretion.

## 2024-02-11 ENCOUNTER — Other Ambulatory Visit: Payer: 59

## 2024-02-11 ENCOUNTER — Encounter (HOSPITAL_COMMUNITY): Payer: Self-pay

## 2024-02-11 ENCOUNTER — Ambulatory Visit
Payer: MEDICAID | Attending: Student in an Organized Health Care Education/Training Program | Admitting: NURSE PRACTITIONER

## 2024-02-11 DIAGNOSIS — K219 Gastro-esophageal reflux disease without esophagitis: Secondary | ICD-10-CM

## 2024-02-11 DIAGNOSIS — D494 Neoplasm of unspecified behavior of bladder: Secondary | ICD-10-CM

## 2024-02-11 DIAGNOSIS — F17200 Nicotine dependence, unspecified, uncomplicated: Secondary | ICD-10-CM

## 2024-02-11 DIAGNOSIS — D649 Anemia, unspecified: Secondary | ICD-10-CM

## 2024-02-11 DIAGNOSIS — I272 Pulmonary hypertension, unspecified: Secondary | ICD-10-CM

## 2024-02-11 DIAGNOSIS — I2510 Atherosclerotic heart disease of native coronary artery without angina pectoris: Secondary | ICD-10-CM

## 2024-02-11 DIAGNOSIS — N135 Crossing vessel and stricture of ureter without hydronephrosis: Secondary | ICD-10-CM

## 2024-02-11 DIAGNOSIS — Z9981 Dependence on supplemental oxygen: Secondary | ICD-10-CM

## 2024-02-11 DIAGNOSIS — Z8679 Personal history of other diseases of the circulatory system: Secondary | ICD-10-CM

## 2024-02-11 DIAGNOSIS — I6521 Occlusion and stenosis of right carotid artery: Secondary | ICD-10-CM

## 2024-02-11 DIAGNOSIS — J449 Chronic obstructive pulmonary disease, unspecified: Secondary | ICD-10-CM

## 2024-02-11 DIAGNOSIS — F1290 Cannabis use, unspecified, uncomplicated: Secondary | ICD-10-CM

## 2024-02-11 DIAGNOSIS — K7460 Unspecified cirrhosis of liver: Secondary | ICD-10-CM

## 2024-02-11 DIAGNOSIS — Z01818 Encounter for other preprocedural examination: Secondary | ICD-10-CM

## 2024-02-11 DIAGNOSIS — D696 Thrombocytopenia, unspecified: Secondary | ICD-10-CM

## 2024-02-11 DIAGNOSIS — F17210 Nicotine dependence, cigarettes, uncomplicated: Secondary | ICD-10-CM

## 2024-02-11 DIAGNOSIS — R319 Hematuria, unspecified: Secondary | ICD-10-CM

## 2024-02-11 NOTE — Nurses Notes (Signed)
 [facility]    Preoperative Evaluation Center   Department of Anesthesiology      Name: Hector [name], Hector [name]   DOB: [birth year]    PRE-PROCEDURE/OPERATIVE INSTRUCTIONS   Preoperative Evaluation Center [facility])  [address] [place], [place] [address]    Thank you for choosing [facility] for your health care needs. [facility] is a tobacco-free campus. Please refrain from using any forms of tobacco while on the premises.     Please review upon receipt and again prior to procedure date.    If, after your PEC call or visit, you experience any of the changes below or develop any of the listed symptoms, Call the Preoperative Evaluation Center [facility]) at [phone].   Change in your best contact phone number.  Changes in your medications, especially starting a new medication.  Changes in your medical history, including an Emergency room visit, a hospital admission, or you receive a new diagnosis.    Develop any of these symptoms:  fever, cough, sore throat, shortness of breath, chills, muscle pain, new loss of taste or smell, vomiting or diarrhea, or fatigue (extreme tiredness or lack of energy).  Diagnosed with conjunctivitis (pink eye) or shingles.  Diagnosed as COVID positive.  Develop a wound, rash, open area or sores.  If you use or are prescribed an antibiotic.     Also contact your PCP to discuss your symptom(s) and the potential need for treatment/ appointments/etc.     PROCEDURE:    Procedure(s):  CYSTOSCOPY WITH RETROGRADES  PYELOGRAM  CYSTOSCOPY WITH BLADDER BIOPSY  RESECTION TUMOR BLADDER TRANSURETHRAL    DATE of SURGERY:   February 24, 2024    ARRIVAL LOCATION:   1st Floor Registration     Arrival and diet instruction times will be given the business day prior to your procedure between 2 pm - 5 pm. If you should not hear from anyone by 5 pm the business day prior to your procedure, please call [phone] (Option 4).    Patients and visitors may park for free in the lots in front of the hospital. If you are need  assistance from [place] parking lot, we have transportation available to you.  Call security at [phone] to arrange pick up from the parking lot.    DIET INSTRUCTIONS:    STOP regular diet 8 hours before the arrival time of the surgery/procedure.   STOP clear liquids, including the 20 oz/ounce ELECTROLYTE drink, 3 hours before scheduled surgery time  then NPO (NPO means NO FOOD or DRINK).    ACCEPTABLE CLEAR LIQUIDS: Water, fruit juices (white grape or apple), pedialyte, gatorade, contrast dye (limited to non-particulate forms, such as gastrografin), coffee & tea without fat/milk/creamer, and clear broth without fat or protein.     ELECTROLYTE DRINK:  Gatorade, Powerade or Pedialyte, any flavor but exclude colors red/blue/purple.  Drink MUST be completed before clear liquid stop time.      LIQUIDS NOT ACCEPTED: Orange juice, any product colored red/blue/purple, coffee or tea with fat/milk/creamer, carbonated beverages or any alcoholic beverages.        Your diet instructions are specific for your safety. When not followed, particles left in your stomach could enter your lungs during surgery. Failure to follow the instructions could result in a delay or cancellation of your surgery.                     No tobacco (cigarettes, vaping, snuff, or chewing tobacco) or medical marijuana (all types) 8  hours prior to scheduled arrival time.  No Alcohol, or recreational drugs, including marijuana, 24 hours prior to scheduled arrival time.  Chewing gum is permitted but do not swallow the gum as your surgery may be cancelled if swallowed.    MEDICATION INSTRUCTIONS:    Meds to take day of procedure with a sip of water: Tylenol if needed, inhalers nebulizers as prescribed, mirabegron, Protonix     Meds to stop: Hold Aspirin and Plavix as of 02/17/24    Avoid taking:   All NSAIDS (Ibuprofen/Motrin/Advil, Aleve/Naprosyn/Anaprox, Diclofenac/Voltaren, Meloxicam/Mobic, Ketorolac/Toradol). Vitamin E, Multivitamins, Herbals, Fish Oil,  Supplements, & other over the counter meds for 7 days prior to your procedure, unless instructed otherwise. Tylenol is safe to take up until and including the morning of surgery.    PREP:   Before surgery you can play an important role in your own health. Because skin is not sterile, we need to be sure that your skin is as free from germs as possible before surgery. You can help reduce the number of germs on your skin by carefully washing before surgery. To help make the process more effective, you will need to take 2 showers (one the evening before and one the morning of) using an antibacterial soap. Washing your hair (head) with normal Shampoo is acceptable. Dress in clean clothes after each shower. No shaving for 2 days prior to surgery to prevent any break in the skin. Once you have showered, do not apply deodorant, lotions, moisturizers, makeup, body spray, perfume, cologne, or aftershave unless instructed differently by your Surgeon or their Clinic.    TRANSPORTATION:  You must arrange for a responsible person, 18 or older, to drive you home and stay for 24 hours following discharge after the procedure. Public transportation may only be used in the event you still have the responsible person with you, drivers of the transportation are not responsible for your care.  If you have not arranged for a driver and responsible person to accompany you, your procedure will be cancelled.      If you use home oxygen, bring enough for travel to hospital and return trip home.     OTHER IMPORTANT INFORMATION:  Do not bring money, jewelry, or valuables with you.    Do not wear make-up, nail polish, lotion, powder, or aftershave. Do not wear any jewelry, watches, earrings, rings, or body piercings.    Piercings, including silicone, will be required to be removed in Pre-op area prior to surgery.  Remove your continuous glucose monitoring system prior to arrival as staff are required to check your glucose using facility  monitors. Feel free to bring your device with you as you can reapply it after surgery, upon discharge.    Transport planner for implantable devices (Deep Brain Stimulator or Nerve Stimulator for pain/mental health/sleep apnea, etc.) as they may be required to be turned off.  Minors must be accompanied by a parent or legal guardian. The responsible adult must stay with the patient before and after the procedure.  No visitors under age 97 are permitted in the preparation/recovery areas. It is strongly suggested that child-care arrangements be made for other children at home.  Wear glasses instead of contact lenses. If possible, bring a case for your glasses.  It may be possible to wear your hearing aid throughout the procedure. Discuss this with the Pre op nursing staff.  Wear comfortable shoes and clean clothes to the hospital as you will wear them home after the procedure.  Bring any equipment - crutches, splints, etc., that you are already using at home.  Bring any legal paperwork with you (guardianship, custody, Medical Power of Attorney, Designer, industrial/product, etc) if applicable.  Before using the bathroom at the hospital, check with hospital staff for needed specimens.  You must have a valid photo ID to fill prescriptions for narcotics.  If discharged with prescriptions, we offer an in-house delivery.  Narcotics require in-person pick up with valid ID.  Other medications can be delivered to your bedside.    LODGING:    Both the [employer] and  [facility] provide "homes away from home" for families of patients. If you, the patient, plan to stay, a responsible person 47 or older, is required to stay with you for 24 hours following discharge, the same as if you are going home after discharge from facility. The [facility], located across the parking lot from the hospital provides lodging and supportive services to adult patients and their families. To be eligible, guests must live 50  miles or more from the [place] area and request a referral from a hospital staff person to be placed on the waiting list. [facility] staff and volunteers can assist you with hotel reservations, usually at a discounted rate, until a room becomes available. [phone]. The [employer], located to the right of [facility] provides temporary lodging to families of children receiving hospital treatment. [phone].    For more information on local motels and a list of private homes providing rental rooms, contact Social Services at [phone].    SPIRITUAL CARE:  [facility] has chaplains available every day for your spiritual needs. Our Interfaith Prayer and Meditation Room is located at [facility] on the first floor between the [employer] and elevators. You may ask your nurse or staff member to contact the on-call chaplain anytime.    For questions regarding instructions please contact PEC at [phone]. Leave a message if no answer.      CANCEL/PROCEDURE/SURGERY:  If you must cancel your procedure, call [phone] (Option 4) between 6 am and 5:30 pm. After 5:30 pm, call [facility] Healthline at [phone].

## 2024-02-11 NOTE — Patient Instructions (Addendum)
 MEDICATION INSTRUCTIONS    Patient instructed to take the following medications the morning of surgery: Tylenol if needed, inhalers nebulizers as prescribed, mirabegron, Protonix    Aspirin, Plavix- recommend restarting medications starting today. Hold both aspirin and Plavix for 7 days prior to surgery per surgical service.     Patient instructed to hold NSAIDs (Motrin, Advil, Ibuprofen, Aleve, Naprosyn, Meloxicam, Diclofenac, etc.), vitamins, fish oil, and herbal supplements 7 days prior to the procedure.      DIET INSTRUCTIONS   Regular diet until 8 hours prior to arrival. Clear liquids until 3 hours prior to procedure/surgery. Please drink up to 20 oz of Gatorade, Powerade, or Pedialyte up to three hours before procedure/surgery (No red colored drinks). Drinks must be finished 3 hours before scheduled procedure/surgery. You are to have nothing by mouth 3 hours prior to your scheduled procedure/surgery.  No tobacco/nicotine or mints for 8 hours prior to arrival.       Please call [phone] to speak with a nurse regarding any questions, changes in medical history, or medications.

## 2024-02-11 NOTE — Nurses Notes (Deleted)
 [facility]    Preoperative Evaluation Center   Department of Anesthesiology      Name: Hector [name], Hector [name]   DOB: [birth year]    PRE-PROCEDURE/OPERATIVE INSTRUCTIONS   Preoperative Evaluation Center [facility])  [address], [place], [place] [address]    Thank you for choosing [facility] for your health care needs. [facility] is a tobacco-free campus. Please refrain from using any forms of tobacco while on the premises.     Please review upon receipt and again prior to procedure date.    If, after your PEC call or visit, you experience any of the changes below or develop any of the listed symptoms, Call the Preoperative Evaluation Center [facility]) at [phone].   Change in your best contact phone number.  Changes in your medications, especially starting a new medication.  Changes in your medical history, including an Emergency room visit, a hospital admission, or you receive a new diagnosis.    Develop any of these symptoms:  fever, cough, sore throat, shortness of breath, chills, muscle pain, new loss of taste or smell, vomiting or diarrhea, or fatigue (extreme tiredness or lack of energy).  Diagnosed with conjunctivitis (pink eye) or shingles.  Diagnosed as COVID positive.  Develop a wound, rash, open area or sores.  If you use or are prescribed an antibiotic.     Also contact your PCP to discuss your symptom(s) and the potential need for treatment/ appointments/etc.     PROCEDURE:    Procedure(s):  CYSTOSCOPY WITH RETROGRADES  PYELOGRAM  CYSTOSCOPY WITH BLADDER BIOPSY  RESECTION TUMOR BLADDER TRANSURETHRAL    DATE of SURGERY:   February 24, 2024    ARRIVAL LOCATION:   1st Floor Registration     Arrival and diet instruction times will be given the business day prior to your procedure between 2 pm - 5 pm. If you should not hear from anyone by 5 pm the business day prior to your procedure, please call [phone] (Option 4).    Patients and visitors may park for free in the lots in front of the hospital. If you are need  assistance from [place] parking lot, we have transportation available to you.  Call security at [phone] to arrange pick up from the parking lot.    DIET INSTRUCTIONS:    STOP regular diet 8 hours before the arrival time of the surgery/procedure.   STOP clear liquids, including the 20 oz/ounce ELECTROLYTE drink, 3 hours before scheduled surgery time  then NPO (NPO means NO FOOD or DRINK).    ACCEPTABLE CLEAR LIQUIDS: Water, fruit juices (white grape or apple), pedialyte, gatorade, contrast dye (limited to non-particulate forms, such as gastrografin), coffee & tea without fat/milk/creamer, and clear broth without fat or protein.     ELECTROLYTE DRINK:  Gatorade, Powerade or Pedialyte, any flavor but exclude colors red/blue/purple.  Drink MUST be completed before clear liquid stop time.      LIQUIDS NOT ACCEPTED: Orange juice, any product colored red/blue/purple, coffee or tea with fat/milk/creamer, carbonated beverages or any alcoholic beverages.        Your diet instructions are specific for your safety. When not followed, particles left in your stomach could enter your lungs during surgery. Failure to follow the instructions could result in a delay or cancellation of your surgery.                     No tobacco (cigarettes, vaping, snuff, or chewing tobacco) or medical marijuana (all types) 8  hours prior to scheduled arrival time.  No Alcohol, or recreational drugs, including marijuana, 24 hours prior to scheduled arrival time.  Chewing gum is permitted but do not swallow the gum as your surgery may be cancelled if swallowed.    MEDICATION INSTRUCTIONS:    Meds to take day of procedure with a sip of water: Tylenol if needed, inhalers nebulizers as prescribed, mirabegron, Protonix     Avoid taking:   All NSAIDS (Ibuprofen/Motrin/Advil, Aleve/Naprosyn/Anaprox, Diclofenac/Voltaren, Meloxicam/Mobic, Ketorolac/Toradol). Vitamin E, Multivitamins, Herbals, Fish Oil, Supplements, & other over the counter meds for 7 days  prior to your procedure, unless instructed otherwise. Tylenol is safe to take up until and including the morning of surgery.    PREP:   Before surgery you can play an important role in your own health. Because skin is not sterile, we need to be sure that your skin is as free from germs as possible before surgery. You can help reduce the number of germs on your skin by carefully washing before surgery. To help make the process more effective, you will need to take 2 showers (one the evening before and one the morning of) using an antibacterial soap. Washing your hair (head) with normal Shampoo is acceptable. Dress in clean clothes after each shower. No shaving for 2 days prior to surgery to prevent any break in the skin. Once you have showered, do not apply deodorant, lotions, moisturizers, makeup, body spray, perfume, cologne, or aftershave unless instructed differently by your Surgeon or their Clinic.    TRANSPORTATION:  You must arrange for a responsible person, 18 or older, to drive you home and stay for 24 hours following discharge after the procedure. Public transportation may only be used in the event you still have the responsible person with you, drivers of the transportation are not responsible for your care.  If you have not arranged for a driver and responsible person to accompany you, your procedure will be cancelled.      If you use home oxygen, bring enough for travel to hospital and return trip home.     OTHER IMPORTANT INFORMATION:  Do not bring money, jewelry, or valuables with you.    Do not wear make-up, nail polish, lotion, powder, or aftershave. Do not wear any jewelry, watches, earrings, rings, or body piercings.    Piercings, including silicone, will be required to be removed in Pre-op area prior to surgery.  Remove your continuous glucose monitoring system prior to arrival as staff are required to check your glucose using facility monitors. Feel free to bring your device with you as you can  reapply it after surgery, upon discharge.    Transport planner for implantable devices (Deep Brain Stimulator or Nerve Stimulator for pain/mental health/sleep apnea, etc.) as they may be required to be turned off.  Minors must be accompanied by a parent or legal guardian. The responsible adult must stay with the patient before and after the procedure.  No visitors under age 23 are permitted in the preparation/recovery areas. It is strongly suggested that child-care arrangements be made for other children at home.  Wear glasses instead of contact lenses. If possible, bring a case for your glasses.  It may be possible to wear your hearing aid throughout the procedure. Discuss this with the Pre op nursing staff.  Wear comfortable shoes and clean clothes to the hospital as you will wear them home after the procedure.  Bring any equipment - crutches, splints, etc., that you are already using  at home.  Bring any legal paperwork with you (guardianship, custody, Medical Power of Attorney, Designer, industrial/product, etc) if applicable.  Before using the bathroom at the hospital, check with hospital staff for needed specimens.  You must have a valid photo ID to fill prescriptions for narcotics.  If discharged with prescriptions, we offer an in-house delivery.  Narcotics require in-person pick up with valid ID.  Other medications can be delivered to your bedside.    LODGING:    Both the [employer] and [facility] provide "homes away from home" for families of patients. If you, the patient, plan to stay, a responsible person 38 or older, is required to stay with you for 24 hours following discharge, the same as if you are going home after discharge from facility. The [facility], located across the parking lot from the hospital provides lodging and supportive services to adult patients and their families. To be eligible, guests must live 50 miles or more from the [place] area and request a referral  from a hospital staff person to be placed on the waiting list. [facility] staff and volunteers can assist you with hotel reservations, usually at a discounted rate, until a room becomes available. [phone]. The [employer], located to the right of [facility] provides temporary lodging to families of children receiving hospital treatment. [phone].    For more information on local motels and a list of private homes providing rental rooms, contact Social Services at [phone].    SPIRITUAL CARE:  [facility] has chaplains available every day for your spiritual needs. Our Interfaith Prayer and Meditation Room is located at [facility] on the first floor between the [employer] and elevators. You may ask your nurse or staff member to contact the on-call chaplain anytime.    For questions regarding instructions please contact PEC at [phone]. Leave a message if no answer.      CANCEL/PROCEDURE/SURGERY:  If you must cancel your procedure, call [phone] (Option 4) between 6 am and 5:30 pm. After 5:30 pm, call [facility] Healthline at [phone].

## 2024-02-11 NOTE — Nurses Notes (Signed)
 [facility]    Preoperative Evaluation Center   Department of Anesthesiology      Name: Hector [name], Hector [name]   DOB: [birth year]    PRE-PROCEDURE/OPERATIVE INSTRUCTIONS   Preoperative Evaluation Center [facility])  [address], [place], [place] [address]    Thank you for choosing [facility] for your health care needs. [facility] is a tobacco-free campus. Please refrain from using any forms of tobacco while on the premises.     Please review upon receipt and again prior to procedure date.    If, after your PEC call or visit, you experience any of the changes below or develop any of the listed symptoms, Call the Preoperative Evaluation Center [facility]) at [phone].   Change in your best contact phone number.  Changes in your medications, especially starting a new medication.  Changes in your medical history, including an Emergency room visit, a hospital admission, or you receive a new diagnosis.    Develop any of these symptoms:  fever, cough, sore throat, shortness of breath, chills, muscle pain, new loss of taste or smell, vomiting or diarrhea, or fatigue (extreme tiredness or lack of energy).  Diagnosed with conjunctivitis (pink eye) or shingles.  Diagnosed as COVID positive.  Develop a wound, rash, open area or sores.  If you use or are prescribed an antibiotic.     Also contact your PCP to discuss your symptom(s) and the potential need for treatment/ appointments/etc.     PROCEDURE:    Procedure(s):  CYSTOSCOPY WITH RETROGRADES  PYELOGRAM  CYSTOSCOPY WITH BLADDER BIOPSY  RESECTION TUMOR BLADDER TRANSURETHRAL    DATE of SURGERY:   February 24, 2024    ARRIVAL LOCATION:   1st Floor Registration     Arrival and diet instruction times will be given the business day prior to your procedure between 2 pm - 5 pm. If you should not hear from anyone by 5 pm the business day prior to your procedure, please call [phone] (Option 4).    Patients and visitors may park for free in the lots in front of the hospital. If you are need  assistance from [place] parking lot, we have transportation available to you.  Call security at [phone] to arrange pick up from the parking lot.    DIET INSTRUCTIONS:    STOP regular diet 8 hours before the arrival time of the surgery/procedure.   STOP clear liquids, including the 20 oz/ounce ELECTROLYTE drink, 3 hours before scheduled surgery time  then NPO (NPO means NO FOOD or DRINK).    ACCEPTABLE CLEAR LIQUIDS: Water, fruit juices (white grape or apple), pedialyte, gatorade, contrast dye (limited to non-particulate forms, such as gastrografin), coffee & tea without fat/milk/creamer, and clear broth without fat or protein.     ELECTROLYTE DRINK:  Gatorade, Powerade or Pedialyte, any flavor but exclude colors red/blue/purple.  Drink MUST be completed before clear liquid stop time.      LIQUIDS NOT ACCEPTED: Orange juice, any product colored red/blue/purple, coffee or tea with fat/milk/creamer, carbonated beverages or any alcoholic beverages.        Your diet instructions are specific for your safety. When not followed, particles left in your stomach could enter your lungs during surgery. Failure to follow the instructions could result in a delay or cancellation of your surgery.                     No tobacco (cigarettes, vaping, snuff, or chewing tobacco) or medical marijuana (all types) 8  hours prior to scheduled arrival time.  No Alcohol, or recreational drugs, including marijuana, 24 hours prior to scheduled arrival time.  Chewing gum is permitted but do not swallow the gum as your surgery may be cancelled if swallowed.    MEDICATION INSTRUCTIONS:    Meds to take day of procedure with a sip of water: Tylenol if needed, inhalers nebulizers as prescribed, mirabegron, Protonix     Avoid taking:   All NSAIDS (Ibuprofen/Motrin/Advil, Aleve/Naprosyn/Anaprox, Diclofenac/Voltaren, Meloxicam/Mobic, Ketorolac/Toradol). Vitamin E, Multivitamins, Herbals, Fish Oil, Supplements, & other over the counter meds for 7 days  prior to your procedure, unless instructed otherwise. Tylenol is safe to take up until and including the morning of surgery.    PREP:   Before surgery you can play an important role in your own health. Because skin is not sterile, we need to be sure that your skin is as free from germs as possible before surgery. You can help reduce the number of germs on your skin by carefully washing before surgery. To help make the process more effective, you will need to take 2 showers (one the evening before and one the morning of) using an antibacterial soap. Washing your hair (head) with normal Shampoo is acceptable. Dress in clean clothes after each shower. No shaving for 2 days prior to surgery to prevent any break in the skin. Once you have showered, do not apply deodorant, lotions, moisturizers, makeup, body spray, perfume, cologne, or aftershave unless instructed differently by your Surgeon or their Clinic.    TRANSPORTATION:  You must arrange for a responsible person, 18 or older, to drive you home and stay for 24 hours following discharge after the procedure. Public transportation may only be used in the event you still have the responsible person with you, drivers of the transportation are not responsible for your care.  If you have not arranged for a driver and responsible person to accompany you, your procedure will be cancelled.      If you use home oxygen, bring enough for travel to hospital and return trip home.     OTHER IMPORTANT INFORMATION:  Do not bring money, jewelry, or valuables with you.    Do not wear make-up, nail polish, lotion, powder, or aftershave. Do not wear any jewelry, watches, earrings, rings, or body piercings.    Piercings, including silicone, will be required to be removed in Pre-op area prior to surgery.  Remove your continuous glucose monitoring system prior to arrival as staff are required to check your glucose using facility monitors. Feel free to bring your device with you as you can  reapply it after surgery, upon discharge.    Transport planner for implantable devices (Deep Brain Stimulator or Nerve Stimulator for pain/mental health/sleep apnea, etc.) as they may be required to be turned off.  Minors must be accompanied by a parent or legal guardian. The responsible adult must stay with the patient before and after the procedure.  No visitors under age 23 are permitted in the preparation/recovery areas. It is strongly suggested that child-care arrangements be made for other children at home.  Wear glasses instead of contact lenses. If possible, bring a case for your glasses.  It may be possible to wear your hearing aid throughout the procedure. Discuss this with the Pre op nursing staff.  Wear comfortable shoes and clean clothes to the hospital as you will wear them home after the procedure.  Bring any equipment - crutches, splints, etc., that you are already using  at home.  Bring any legal paperwork with you (guardianship, custody, Medical Power of Attorney, Designer, industrial/product, etc) if applicable.  Before using the bathroom at the hospital, check with hospital staff for needed specimens.  You must have a valid photo ID to fill prescriptions for narcotics.  If discharged with prescriptions, we offer an in-house delivery.  Narcotics require in-person pick up with valid ID.  Other medications can be delivered to your bedside.    LODGING:    Both the [employer] and [facility] provide "homes away from home" for families of patients. If you, the patient, plan to stay, a responsible person 38 or older, is required to stay with you for 24 hours following discharge, the same as if you are going home after discharge from facility. The [facility], located across the parking lot from the hospital provides lodging and supportive services to adult patients and their families. To be eligible, guests must live 50 miles or more from the [place] area and request a referral  from a hospital staff person to be placed on the waiting list. [facility] staff and volunteers can assist you with hotel reservations, usually at a discounted rate, until a room becomes available. [phone]. The [employer], located to the right of [facility] provides temporary lodging to families of children receiving hospital treatment. [phone].    For more information on local motels and a list of private homes providing rental rooms, contact Social Services at [phone].    SPIRITUAL CARE:  [facility] has chaplains available every day for your spiritual needs. Our Interfaith Prayer and Meditation Room is located at [facility] on the first floor between the [employer] and elevators. You may ask your nurse or staff member to contact the on-call chaplain anytime.    For questions regarding instructions please contact PEC at [phone]. Leave a message if no answer.      CANCEL/PROCEDURE/SURGERY:  If you must cancel your procedure, call [phone] (Option 4) between 6 am and 5:30 pm. After 5:30 pm, call [facility]line at [phone].

## 2024-02-12 ENCOUNTER — Telehealth: Payer: Self-pay

## 2024-02-12 ENCOUNTER — Encounter (HOSPITAL_BASED_OUTPATIENT_CLINIC_OR_DEPARTMENT_OTHER): Payer: Self-pay

## 2024-02-12 LAB — LIPID PANEL
Chol/HDL Ratio: 2.5 {ratio} (ref 0.0–5.0)
Cholesterol, Total: 127 mg/dL (ref 100–199)
HDL: 51 mg/dL (ref >39)
LDL Chol Calc (NIH): 63 mg/dL (ref 0–99)
Triglycerides: 64 mg/dL (ref 0–149)
VLDL Cholesterol Cal: 13 mg/dL (ref 5–40)

## 2024-02-12 NOTE — Telephone Encounter (Signed)
Spoke with pt. Pt was notified of lab results. Pt will continue current medication and f/u as planned.  

## 2024-02-13 NOTE — Patient Instructions (Addendum)
 It was great to see you again today, I will be in touch with your lab work ASAP  Recommend that you get the shingles vaccine series- shingles is no fun!    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

## 2024-02-13 NOTE — Progress Notes (Signed)
[facility] [address], [address] [place], [place] [address] [phone]  Date:  02/22/2024   Name:  Curtis Luna   DOB:  26-Jul-1961   MRN:  [id number]  PCP:  Pearline Cables, MD    Chief Complaint: Annual Exam (Concerns/ questions: 1. R index finger is bending. 2. Neuropathy/Pna20 due/Shingrix ;None/Colon due: 04/02/24)   History of Present Illness:  Curtis Luna is a 63 y.o. very pleasant male patient who presents with the following:  Patient seen today for physical exam-  history of coronary artery disease, ocular migraine, GERD, hyperlipidemia  Most recent visit with myself was last month when he was ill with cough.  Negative chest x-ray, treated with Augmentin  He was seen by cardiology last month as well Moderate nonobstructive CAD - Continue 81 mg aspirin, would like to stop 20 mg Crestor - No angina -- he remains active Hyperlipidemia with LDL goal less than 70 Lipids 01/2023: Total chol 134 HDL 53 LDL 65 Trig 79 - has been on 20 mg rosuvastatin-he believes this is contributing to his neuropathy -- discussed a drug holiday and referral to pharmD He shows me a Function Health panel drawn on/about 11/29/23: A1c 5.8% LDL 54 HDL 48 LPA less than 10 Total cholesterol 119 Triglycerides 83 ApoB 57 - he will stop crestor and redraw labs in 3 months. If above goal, we will refer to pharmD for PCSK9i. 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 He notes the bottoms  of his feet will hurt when he is walking Does not sound like PF- feels fine in the am  We have followed this for him for a year, he saw neurology and had nerve conduction testing - all normal except for the left arm which suffered a sever fracture years ago   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   Seasonal affective disorder (HCC) 10/02/2014   Internal hemorrhoid 04/20/2014   Chronic low back pain 04/20/2014   Anxiety 04/20/2014   Esophageal reflux 04/04/2011   Diverticulosis of colon 04/04/2011    Past Medical History:  Diagnosis Date   Abdominal pain, right lower quadrant    Acute bronchospasm    Allergy    Anxiety    on and off   Backache, unspecified    Chicken pox    Diverticulosis of colon (without mention of hemorrhage)    Esophageal reflux    External hemorrhoids without mention of complication    thrombosed   Hiatal hernia    Hyperlipidemia    no medicines    Inguinal hernia without  mention of obstruction or gangrene, unilateral or unspecified, (not specified as recurrent)    Migraine    Neuropathy    Obstructive sleep apnea (adult) (pediatric)    uses oral appliance   Other symptoms involving digestive system(787.99)    Pain in joint, forearm    Pain in joint, shoulder region    Seasonal allergies    Sleep apnea    no cpap but uses dental device    Thoracic or lumbosacral neuritis or radiculitis, unspecified     Past Surgical History:  Procedure Laterality Date   COLON RESECTION  10/06   sigmoid colon   COLONOSCOPY     INGUINAL HERNIA REPAIR  12/2012   Left   UPPER GASTROINTESTINAL ENDOSCOPY      WISDOM TOOTH EXTRACTION      Social History   Tobacco Use   Smoking status: Former    Current packs/day: 0.00    Types: Cigarettes, Cigars    Quit date: 10/04/1987    Years since quitting: 36.4   Smokeless tobacco: Never  Vaping Use   Vaping status: Never Used  Substance Use Topics   Alcohol use: Yes    Alcohol/week: 14.0 standard drinks of alcohol    Types: 14 Standard drinks or equivalent per week    Comment: 2 daily    Drug use: No    Family History  Problem Relation Age of Onset   Lung cancer Mother 58       Deceased   Other Father        MVA   Aneurysm Sister        Brain   Healthy Brother        x2   Heart disease Paternal Grandmother    Heart attack Paternal Grandmother    Allergies Daughter        x2   Migraines Daughter        x1   Diabetes Maternal Aunt    Neuropathy Other    Colon cancer Neg Hx    Colon polyps Neg Hx    Esophageal cancer Neg Hx    Rectal cancer Neg Hx    Stomach cancer Neg Hx    Pancreatic cancer Neg Hx    Allergic rhinitis Neg Hx    Angioedema Neg Hx    Asthma Neg Hx    Atopy Neg Hx    Eczema Neg Hx    Immunodeficiency Neg Hx    Urticaria Neg Hx     Allergies  Allergen Reactions   Escitalopram Hives    Pt felt anxious  Pt felt his skin was "on fire"   Influenza Vaccines Other (See Comments)    Brain fog, dizziness, tinnitus   Protonix [Pantoprazole Sodium] Other (See Comments)    Tingling of extremities    Medication list has been reviewed and updated.  Current Outpatient Medications on File Prior to Visit  Medication Sig Dispense Refill   Ascorbic Acid (VITAMIN C) 1000 MG tablet Take 1,000 mg by mouth daily.     b complex vitamins tablet Take 1 tablet by mouth daily. Every other day     cholecalciferol (VITAMIN D3) 25 MCG (1000 UT) tablet Take 1,000 Units by mouth daily.     Famotidine (PEPCID PO) Take by mouth as needed.     gabapentin (NEURONTIN) 100 MG capsule TAKE 1 CAPSULE BY MOUTH 3 TIMES  DAILY (Patient  taking differently: Take 100 mg by mouth 4 (four) times daily.) 270 capsule 3   LORazepam (  ATIVAN) 1 MG tablet Take 1 tablet (1 mg total) by mouth as needed for anxiety. May take 1 per day 30 tablet 0   No current facility-administered medications on file prior to visit.    Review of Systems:  As per HPI- otherwise negative.   Physical Examination: Vitals:   02/22/24 0839  BP: 122/72  Pulse: 74  Resp: 18  Temp: 98.1 F (36.7 C)  SpO2: 97%   Vitals:   02/22/24 0839  Weight: 145 lb 3.2 oz (65.9 kg)  Height: 5\' 5"  (1.651 m)   Body mass index is 24.16 kg/m. Ideal Body Weight: Weight in (lb) to have BMI = 25: 149.9  GEN: no acute distress. Normal weight, looks well  HEENT: Atraumatic, Normocephalic.  Bilateral TM wnl, oropharynx normal.  PEERL,EOMI.   Ears and Nose: No external deformity. CV: RRR, No M/G/R. No JVD. No thrill. No extra heart sounds. PULM: CTA B, no wheezes, crackles, rhonchi. No retractions. No resp. distress. No accessory muscle use. ABD: S, NT, ND. No rebound. No HSM. EXTR: No c/c/e PSYCH: Normally interactive. Conversant.  Right knee; no crepitus or effusion, exam is normal  Right index finger- degenerative change at DIP  B foot exam today- normal pulses and sensation   Assessment and Plan: Physical exam  Special screening, prostate cancer - Plan: PSA  Dyslipidemia  Screening for deficiency anemia - Plan: CBC  Medication monitoring encounter - Plan: Comprehensive metabolic panel  Prediabetes - Plan: Hemoglobin A1c  Chronic pain of right knee - Plan: Ambulatory referral to Orthopedic Surgery  Physical exam today.  Encouraged healthy diet and exercise routine He notes pain in his right knee, will be interested in potentially having a joint injection.  Referral made to Ortho Will plan further follow- up pending labs.  Signed Abbe Amsterdam, MD  Received labs as below, message to patient and also completed his biometric form Results for  orders placed or performed in visit on 02/22/24  PSA   Collection Time: 02/22/24  9:10 AM  Result Value Ref Range   PSA 0.98 0.10 - 4.00 ng/mL  CBC   Collection Time: 02/22/24  9:10 AM  Result Value Ref Range   WBC 5.6 4.0 - 10.5 K/uL   RBC 4.37 4.22 - 5.81 Mil/uL   Platelets 263.0 150.0 - 400.0 K/uL   Hemoglobin 14.0 13.0 - 17.0 g/dL   HCT 16.1 09.6 - 04.5 %   MCV 96.0 78.0 - 100.0 fl   MCHC 33.4 30.0 - 36.0 g/dL   RDW 40.9 81.1 - 91.4 %  Comprehensive metabolic panel   Collection Time: 02/22/24  9:10 AM  Result Value Ref Range   Sodium 140 135 - 145 mEq/L   Potassium 4.4 3.5 - 5.1 mEq/L   Chloride 103 96 - 112 mEq/L   CO2 30 19 - 32 mEq/L   Glucose, Bld 99 70 - 99 mg/dL   BUN 20 6 - 23 mg/dL   Creatinine, Ser 7.82 0.40 - 1.50 mg/dL   Total Bilirubin 0.5 0.2 - 1.2 mg/dL   Alkaline Phosphatase 42 39 - 117 U/L   AST 24 0 - 37 U/L   ALT 22 0 - 53 U/L   Total Protein 6.6 6.0 - 8.3 g/dL   Albumin 4.4 3.5 - 5.2 g/dL   GFR 95.62 >13.08 mL/min   Calcium 9.7 8.4 - 10.5 mg/dL  Hemoglobin M5H   Collection Time: 02/22/24  9:10 AM  Result Value Ref Range   Hgb A1c MFr Bld  5.8 4.6 - 6.5 %    

## 2024-02-15 ENCOUNTER — Encounter (HOSPITAL_BASED_OUTPATIENT_CLINIC_OR_DEPARTMENT_OTHER): Payer: Self-pay | Admitting: Adult Health

## 2024-02-22 ENCOUNTER — Ambulatory Visit (INDEPENDENT_AMBULATORY_CARE_PROVIDER_SITE_OTHER): Payer: 59 | Admitting: Family Medicine

## 2024-02-22 ENCOUNTER — Encounter: Payer: Self-pay | Admitting: Family Medicine

## 2024-02-22 VITALS — BP 122/72 | HR 74 | Temp 98.1°F | Resp 18 | Ht 65.0 in | Wt 145.2 lb

## 2024-02-22 DIAGNOSIS — Z130 Encounter for screening for diseases of the blood and blood-forming organs and certain disorders involving the immune mechanism: Secondary | ICD-10-CM

## 2024-02-22 DIAGNOSIS — R7303 Prediabetes: Secondary | ICD-10-CM | POA: Diagnosis not present

## 2024-02-22 DIAGNOSIS — E785 Hyperlipidemia, unspecified: Secondary | ICD-10-CM | POA: Diagnosis not present

## 2024-02-22 DIAGNOSIS — M25561 Pain in right knee: Secondary | ICD-10-CM

## 2024-02-22 DIAGNOSIS — Z5181 Encounter for therapeutic drug level monitoring: Secondary | ICD-10-CM

## 2024-02-22 DIAGNOSIS — G8929 Other chronic pain: Secondary | ICD-10-CM

## 2024-02-22 DIAGNOSIS — Z125 Encounter for screening for malignant neoplasm of prostate: Secondary | ICD-10-CM

## 2024-02-22 DIAGNOSIS — Z0000 Encounter for general adult medical examination without abnormal findings: Secondary | ICD-10-CM | POA: Diagnosis not present

## 2024-02-22 LAB — COMPREHENSIVE METABOLIC PANEL
ALT: 22 U/L (ref 0–53)
AST: 24 U/L (ref 0–37)
Albumin: 4.4 g/dL (ref 3.5–5.2)
Alkaline Phosphatase: 42 U/L (ref 39–117)
BUN: 20 mg/dL (ref 6–23)
CO2: 30 meq/L (ref 19–32)
Calcium: 9.7 mg/dL (ref 8.4–10.5)
Chloride: 103 meq/L (ref 96–112)
Creatinine, Ser: 0.86 mg/dL (ref 0.40–1.50)
GFR: 91.98 mL/min (ref >60.00)
Glucose, Bld: 99 mg/dL (ref 70–99)
Potassium: 4.4 meq/L (ref 3.5–5.1)
Sodium: 140 meq/L (ref 135–145)
Total Bilirubin: 0.5 mg/dL (ref 0.2–1.2)
Total Protein: 6.6 g/dL (ref 6.0–8.3)

## 2024-02-22 LAB — HEMOGLOBIN A1C: Hgb A1c MFr Bld: 5.8 % (ref 4.6–6.5)

## 2024-02-22 LAB — CBC
HCT: 42 % (ref 39.0–52.0)
Hemoglobin: 14 g/dL (ref 13.0–17.0)
MCHC: 33.4 g/dL (ref 30.0–36.0)
MCV: 96 fl (ref 78.0–100.0)
Platelets: 263 10*3/uL (ref 150.0–400.0)
RBC: 4.37 Mil/uL (ref 4.22–5.81)
RDW: 14.6 % (ref 11.5–15.5)
WBC: 5.6 10*3/uL (ref 4.0–10.5)

## 2024-02-22 LAB — PSA: PSA: 0.98 ng/mL (ref 0.10–4.00)

## 2024-02-24 ENCOUNTER — Encounter (HOSPITAL_COMMUNITY): Payer: Self-pay | Admitting: Student in an Organized Health Care Education/Training Program

## 2024-02-24 ENCOUNTER — Other Ambulatory Visit: Payer: Self-pay

## 2024-02-24 ENCOUNTER — Ambulatory Visit (HOSPITAL_COMMUNITY): Payer: MEDICAID

## 2024-02-24 ENCOUNTER — Ambulatory Visit (HOSPITAL_COMMUNITY): Payer: MEDICAID | Admitting: Anesthesiology

## 2024-02-24 ENCOUNTER — Ambulatory Visit
Admission: RE | Admit: 2024-02-24 | Discharge: 2024-02-24 | Disposition: A | Discharge status: Home / Self Care | Payer: MEDICAID | Source: Ambulatory Visit | Attending: Student in an Organized Health Care Education/Training Program | Admitting: Student in an Organized Health Care Education/Training Program

## 2024-02-24 ENCOUNTER — Encounter (HOSPITAL_COMMUNITY)
Admission: RE | Disposition: A | Discharge status: Home / Self Care | Payer: Self-pay | Source: Ambulatory Visit | Attending: Student in an Organized Health Care Education/Training Program

## 2024-02-24 DIAGNOSIS — Z9981 Dependence on supplemental oxygen: Secondary | ICD-10-CM | POA: Insufficient documentation

## 2024-02-24 DIAGNOSIS — D759 Disease of blood and blood-forming organs, unspecified: Secondary | ICD-10-CM | POA: Insufficient documentation

## 2024-02-24 DIAGNOSIS — K7460 Unspecified cirrhosis of liver: Secondary | ICD-10-CM | POA: Insufficient documentation

## 2024-02-24 DIAGNOSIS — I25110 Atherosclerotic heart disease of native coronary artery with unstable angina pectoris: Secondary | ICD-10-CM | POA: Insufficient documentation

## 2024-02-24 DIAGNOSIS — K219 Gastro-esophageal reflux disease without esophagitis: Secondary | ICD-10-CM | POA: Insufficient documentation

## 2024-02-24 DIAGNOSIS — I10 Essential (primary) hypertension: Secondary | ICD-10-CM | POA: Insufficient documentation

## 2024-02-24 DIAGNOSIS — F32A Depression, unspecified: Secondary | ICD-10-CM | POA: Insufficient documentation

## 2024-02-24 DIAGNOSIS — C678 Malignant neoplasm of overlapping sites of bladder: Secondary | ICD-10-CM | POA: Insufficient documentation

## 2024-02-24 DIAGNOSIS — R310 Gross hematuria: Secondary | ICD-10-CM

## 2024-02-24 DIAGNOSIS — F1290 Cannabis use, unspecified, uncomplicated: Secondary | ICD-10-CM | POA: Insufficient documentation

## 2024-02-24 DIAGNOSIS — E785 Hyperlipidemia, unspecified: Secondary | ICD-10-CM | POA: Insufficient documentation

## 2024-02-24 DIAGNOSIS — F1090 Alcohol use, unspecified, uncomplicated: Secondary | ICD-10-CM | POA: Insufficient documentation

## 2024-02-24 DIAGNOSIS — D696 Thrombocytopenia, unspecified: Secondary | ICD-10-CM | POA: Insufficient documentation

## 2024-02-24 DIAGNOSIS — J449 Chronic obstructive pulmonary disease, unspecified: Secondary | ICD-10-CM | POA: Insufficient documentation

## 2024-02-24 SURGERY — URETEROSCOPY
Anesthesia: General | Laterality: Right | Wound class: Clean Contaminated Wounds-The respiratory, GI, Genital, or urinary

## 2024-02-24 MED ORDER — KETOROLAC 10 MG TABLET
10.0000 mg | ORAL_TABLET | Freq: Four times a day (QID) | ORAL | 0 refills | Status: DC | PRN
Start: 2024-02-24 — End: 2024-05-02
  Filled 2024-02-24: qty 15, 4d supply, fill #0

## 2024-02-24 MED ORDER — LABETALOL 5 MG/ML INTRAVENOUS SOLUTION
Freq: Once | INTRAVENOUS | Status: DC | PRN
Start: 2024-02-24 — End: 2024-02-24
  Administered 2024-02-24: 10 mg via INTRAVENOUS

## 2024-02-24 MED ORDER — ROCURONIUM 10 MG/ML INTRAVENOUS SYRINGE WRAPPER
INJECTION | INTRAVENOUS | Status: AC
Start: 2024-02-24 — End: 2024-02-24
  Filled 2024-02-24: qty 5

## 2024-02-24 MED ORDER — ROCURONIUM 10 MG/ML INTRAVENOUS SYRINGE WRAPPER
INJECTION | Freq: Once | INTRAVENOUS | Status: DC | PRN
Start: 2024-02-24 — End: 2024-02-24
  Administered 2024-02-24: 20 mg via INTRAVENOUS
  Administered 2024-02-24: 50 mg via INTRAVENOUS

## 2024-02-24 MED ORDER — PROPOFOL 10 MG/ML INTRAVENOUS EMULSION
INTRAVENOUS | Status: AC
Start: 2024-02-24 — End: 2024-02-24
  Filled 2024-02-24: qty 20

## 2024-02-24 MED ORDER — SODIUM CHLORIDE 0.9 % INTRAVENOUS PIGGYBACK
3.0000 g | Freq: Once | INTRAVENOUS | Status: AC
Start: 2024-02-24 — End: 2024-02-24
  Administered 2024-02-24: 3 g via INTRAVENOUS
  Filled 2024-02-24: qty 8

## 2024-02-24 MED ORDER — FENTANYL (PF) 50 MCG/ML INJECTION SOLUTION
INTRAMUSCULAR | Status: AC
Start: 2024-02-24 — End: 2024-02-24
  Filled 2024-02-24: qty 2

## 2024-02-24 MED ORDER — ONDANSETRON HCL (PF) 4 MG/2 ML INJECTION SOLUTION
INTRAMUSCULAR | Status: AC
Start: 2024-02-24 — End: 2024-02-24
  Filled 2024-02-24: qty 2

## 2024-02-24 MED ORDER — SODIUM CHLORIDE 0.9% FLUSH BAG - 250 ML
INTRAVENOUS | Status: DC | PRN
Start: 2024-02-24 — End: 2024-02-24

## 2024-02-24 MED ORDER — HYOSCYAMINE SULFATE 0.125 MG TABLET
0.1250 mg | ORAL_TABLET | ORAL | 0 refills | Status: DC | PRN
Start: 2024-02-24 — End: 2024-03-30
  Filled 2024-02-24: qty 20, 4d supply, fill #0

## 2024-02-24 MED ORDER — DEXAMETHASONE SODIUM PHOSPHATE 4 MG/ML INJECTION SOLUTION
INTRAMUSCULAR | Status: AC
Start: 2024-02-24 — End: 2024-02-24
  Filled 2024-02-24: qty 1

## 2024-02-24 MED ORDER — ACETAMINOPHEN 160 MG/5 ML ORAL LIQUID WRAPPER
650.0000 mg | Freq: Once | ORAL | Status: DC | PRN
Start: 2024-02-24 — End: 2024-02-24

## 2024-02-24 MED ORDER — LACTATED RINGERS INTRAVENOUS SOLUTION
INTRAVENOUS | Status: DC
Start: 2024-02-24 — End: 2024-02-24

## 2024-02-24 MED ORDER — SODIUM CHLORIDE 0.9 % (FLUSH) INJECTION SYRINGE
2.0000 mL | INJECTION | Freq: Three times a day (TID) | INTRAMUSCULAR | Status: DC
Start: 2024-02-24 — End: 2024-02-24

## 2024-02-24 MED ORDER — LIDOCAINE (PF) 20 MG/ML (2 %) INJECTION SOLUTION
INTRAMUSCULAR | Status: AC
Start: 2024-02-24 — End: 2024-02-24
  Filled 2024-02-24: qty 5

## 2024-02-24 MED ORDER — IOTHALAMATE MEGLUMINE 60 % INJECTION SOLUTION
25.0000 mL | Freq: Once | INTRAMUSCULAR | Status: DC | PRN
Start: 2024-02-24 — End: 2024-02-24

## 2024-02-24 MED ORDER — SULFAMETHOXAZOLE 800 MG-TRIMETHOPRIM 160 MG TABLET
1.0000 | ORAL_TABLET | ORAL | 0 refills | Status: DC
Start: 2024-02-24 — End: 2024-02-29
  Filled 2024-02-24: qty 7, 7d supply, fill #0

## 2024-02-24 MED ORDER — LACTATED RINGERS INTRAVENOUS SOLUTION
INTRAVENOUS | Status: DC
Start: 2024-02-24 — End: 2024-02-24
  Administered 2024-02-24: 0 via INTRAVENOUS

## 2024-02-24 MED ORDER — SUGAMMADEX 100 MG/ML INTRAVENOUS SOLUTION
Freq: Once | INTRAVENOUS | Status: DC | PRN
Start: 2024-02-24 — End: 2024-02-24
  Administered 2024-02-24 (×2): 200 mg via INTRAVENOUS

## 2024-02-24 MED ORDER — FENTANYL (PF) 50 MCG/ML INJECTION SOLUTION
Freq: Once | INTRAMUSCULAR | Status: DC | PRN
Start: 2024-02-24 — End: 2024-02-24
  Administered 2024-02-24 (×2): 100 ug via INTRAVENOUS

## 2024-02-24 MED ORDER — SODIUM CHLORIDE 0.9 % (FLUSH) INJECTION SYRINGE
2.0000 mL | INJECTION | INTRAMUSCULAR | Status: DC | PRN
Start: 2024-02-24 — End: 2024-02-24

## 2024-02-24 MED ORDER — DEXMEDETOMIDINE 4 MCG/ML IV DILUTION
Freq: Once | INTRAMUSCULAR | Status: DC | PRN
Start: 2024-02-24 — End: 2024-02-24
  Administered 2024-02-24: 10 ug via INTRAVENOUS

## 2024-02-24 MED ORDER — PROPOFOL 10 MG/ML IV BOLUS
INJECTION | Freq: Once | INTRAVENOUS | Status: DC | PRN
Start: 2024-02-24 — End: 2024-02-24
  Administered 2024-02-24: 200 mg via INTRAVENOUS

## 2024-02-24 MED ORDER — LIDOCAINE (PF) 100 MG/5 ML (2 %) INTRAVENOUS SYRINGE
INJECTION | Freq: Once | INTRAVENOUS | Status: DC | PRN
Start: 2024-02-24 — End: 2024-02-24
  Administered 2024-02-24: 100 mg via INTRAVENOUS

## 2024-02-24 MED ORDER — DEXTROSE 5% IN WATER (D5W) FLUSH BAG - 250 ML
INTRAVENOUS | Status: DC | PRN
Start: 2024-02-24 — End: 2024-02-24

## 2024-02-24 MED ORDER — DEXAMETHASONE SODIUM PHOSPHATE 4 MG/ML INJECTION SOLUTION
Freq: Once | INTRAMUSCULAR | Status: DC | PRN
Start: 2024-02-24 — End: 2024-02-24
  Administered 2024-02-24: 4 mg via INTRAVENOUS

## 2024-02-24 MED ORDER — HYDROCODONE 5 MG-ACETAMINOPHEN 325 MG TABLET
1.0000 | ORAL_TABLET | Freq: Once | ORAL | Status: DC | PRN
Start: 2024-02-24 — End: 2024-02-24
  Filled 2024-02-24: qty 1

## 2024-02-24 MED ORDER — PROPOFOL 10 MG/ML INTRAVENOUS EMULSION
INTRAVENOUS | Status: DC | PRN
Start: 2024-02-24 — End: 2024-02-24
  Administered 2024-02-24: 150 ug/kg/min via INTRAVENOUS
  Administered 2024-02-24: 0 ug/kg/min via INTRAVENOUS

## 2024-02-24 SURGICAL SUPPLY — 17 items
ARMBRD POSITION 20X8X2IN DVN FOAM (IV TUBING & ACCESSORIES) ×2 IMPLANT
BAG DRAIN 4L URN ANTIREFLUX TWR STRL TVK METAL (UROLOGICAL SUPPLIES) ×2 IMPLANT
BAG DRAIN NONST LF  VNYL ACT 8MM DISP GE/OEC SYS (UROLOGICAL SUPPLIES) ×2 IMPLANT
BLANKET MISTRAL-AIR ADULT UPR BODY 79X29.9IN FRC AIR HI VOL BLWR INTUITIVE CONTROL PNL LRG LED (MED SURG SUPPLIES) ×2 IMPLANT
CATH URETH DOVER 18FR FOLEY 2W LRG SMOOTH DRAIN EYE FIRM TIP SIL 10ML STRL LF  BLU STRP CLR (UROLOGICAL SUPPLIES) ×2 IMPLANT
CUSTOM CYSTO PREP PACK ~~LOC~~ - RUBY MEMORIAL HOSPITAL (CUSTOM TRAYS & PACK) ×1 IMPLANT
CYSTO PREP PACK ~~LOC~~ - RUBY MEMORIAL HOSPITAL (CUSTOM TRAYS & PACK) ×2 IMPLANT
DEVICE SECURE SNAPSECURE 360 SWVL CLIP BFUR FOLEY CATH ADH H FOAM STRL LF (UROLOGICAL SUPPLIES) ×2 IMPLANT
ELECTRODE ESURG 12D 16D LOOP STD 24FR PLASMALOOP STRL DISP HFRQ RESCT LF (ENDOSCOPIC SUPPLIES) ×2 IMPLANT
GARMENT COMPRESS MED CALF CENTAURA NYL VASOGRAD LTWT BRTHBL SEQ FIL BLU 18- IN (MED SURG SUPPLIES) ×2 IMPLANT
KIT RM TURNOVER STPC CUSTOM ALLIED CYSTO (CUSTOM TRAYS & PACK) ×2 IMPLANT
MAT FLR 40X24IN ABS WTPRF BACKSHEET (MED SURG SUPPLIES) ×4 IMPLANT
POSITION POSITION 9IN DONUT HEAD FOAM (MED SURG SUPPLIES) ×2 IMPLANT
SOL DIALYSATE PRISMASOL BK0/0/1.2 POLYOLEFIN REPL PVC FREE 2 CMPRTMNT BAG STRL DISP CRRT (DIALYSIS SUPPLIES) ×8 IMPLANT
STRAP POSITION KNEE FOAM SFT ADJ CNTCT CLSR LF (MED SURG SUPPLIES) ×2 IMPLANT
SYRINGE LL 10ML LF  STRL GRAD N-PYRG DEHP-FR PVC FREE MED DISP (MED SURG SUPPLIES) ×2 IMPLANT
TUBING SUCT CLR 20FT 9/32IN MEDIVAC NCDTV M/M CONN STRL LF (MED SURG SUPPLIES) ×2 IMPLANT

## 2024-02-24 NOTE — Anesthesia Postprocedure Evaluation (Signed)
 Anesthesia Post Op Evaluation    Patient: Hector [name]  Procedure(s):  DIAGNOSTIC URETEROSCOPY  RESECTION TUMOR BLADDER TRANSURETHRAL    Last Vitals:Temperature: 36.2 C (97.2 F) (02/24/24 1445)  Heart Rate: 63 (02/24/24 1445)  BP (Non-Invasive): 122/65 (02/24/24 1445)  Respiratory Rate: 20 (02/24/24 1445)  SpO2: 95 % (02/24/24 1445)    No notable events documented.    Patient is sufficiently recovered from the effects of anesthesia to participate in the evaluation and has returned to their pre-procedure level.  Patient location during evaluation: PACU       Patient participation: complete - patient participated  Level of consciousness: awake and alert and responsive to verbal stimuli    Pain management: adequate  Airway patency: patent    Anesthetic complications: no  Cardiovascular status: acceptable  Respiratory status: acceptable  Hydration status: acceptable  Patient post-procedure temperature: Pt Normothermic   PONV Status: Absent

## 2024-02-24 NOTE — Anesthesia Preprocedure Evaluation (Addendum)
 ANESTHESIA PRE-OP EVALUATION  Planned Procedure: CYSTOSCOPY WITH RETROGRADES  PYELOGRAM  CYSTOSCOPY WITH BLADDER BIOPSY  RESECTION TUMOR BLADDER TRANSURETHRAL  Review of Systems     anesthesia history negative     patient summary reviewed  nursing notes reviewed        Pulmonary   COPD, shortness of breath and home oxygen,   Cardiovascular    Hypertension, murmur, CAD and hyperlipidemia ,No peripheral edema,        GI/Hepatic/Renal    GERD and liver disease        Endo/Other    Thrombocytopenia and blood dyscrasia,      Neuro/Psych/MS    depression, Substance use, alcohol, marijuana     Cancer    negative hematology/oncology ROS,                 Physical Assessment      Airway       Mallampati: II    TM distance: >3 FB    Neck ROM: full  Mouth Opening: good.  No Facial hair  No Beard        Dental           (+) poor dentition           Pulmonary    Breath sounds clear to auscultation  (-) no rhonchi, no decreased breath sounds, no wheezes, no rales and no stridor     Cardiovascular        (+) murmur present   (-) no friction rub, carotid bruit is not present and no peripheral edema     Other findings          Plan  ASA 4     Planned anesthesia type: general     general anesthesia with endotracheal tube intubation                        Anesthesia issues/risks discussed are: PONV, Sore Throat, Difficult Airway, Aspiration, Stroke and Cardiac Events/MI.  Anesthetic plan and risks discussed with patient and significant other  signed consent obtained          Patient's NPO status is appropriate for Anesthesia.           Plan discussed with CRNA.

## 2024-02-24 NOTE — H&P (Signed)
 Urology History and Physical    Collier Bohnet  [id number]  10-13-1961    Chief Complaint:   Michaell Cowing hematuria  Concern for bladder mass    HPI: Zakar Brosch is a 63 y.o. male with above history who presents for TURBT, possible right URS. No new issues since last evaluation by urology.     PMHx:   Past Medical History:   Diagnosis Date    Alcohol abuse     Carotid stenosis     right side stent    Chronic obstructive airway disease (CMS HCC)     Cirrhosis (CMS HCC)     Closed fracture of transverse process of thoracic vertebra (CMS HCC) 05/01/2021    Coma (CMS HCC)     after fall due to alcholism May 2022    Delirium, withdrawal, alcoholic (CMS HCC)     Depression     Encephalopathy 05/15/2021    Esophageal reflux     H/O urinary tract infection     1/25    HTN (hypertension)     Hyperlipidemia     Ileus (CMS HCC) 05/18/2021    Left pulmonary contusion 05/01/2021    Leukopenia 05/18/2021    Multiple rib fractures 05/01/2021    Oxygen dependent     Pleural effusion 05/11/2021    Serum ammonia increased (CMS HCC) 05/15/2021    Shortness of breath     Spleen injury 05/01/2021    Status post thoracentesis 05/19/2021    Thrombocytopenia (CMS HCC) 05/01/2021    Unknown cause of injury     fx nose    Wears glasses          PSHx:   Past Surgical History:   Procedure Laterality Date    CAROTID STENT      COLONOSCOPY  08/2021    ESOPHAGOGASTRODUODENOSCOPY  08/2021    HX OTHER Right 06/29/2023    TCAR    HX TONSILLECTOMY      OTHER SURGICAL HISTORY      HX of plastic surgery to right head         Family Hx: [name]  Family Medical History:       Problem Relation (Age of Onset)    Asthma Father    Cerebral Aneurysm Sister    Dementia Mother    Heart Attack Paternal Grandmother (51)            Social Hx:   Social History     Socioeconomic History    Marital status: Significant Other     Spouse name: vickie    Number of children: 2    Years of education: 12    Highest education level: Not on file   Occupational History    Not on file    Tobacco Use    Smoking status: Every Day     Current packs/day: 1.00     Average packs/day: 1 pack/day for 51.2 years (51.2 ttl pk-yrs)     Types: Cigarettes     Start date: 19    Smokeless tobacco: Never    Tobacco comments:     THE PATIENT STARTED    Vaping Use    Vaping status: Never Used   Substance and Sexual Activity    Alcohol use: Not Currently     Alcohol/week: 15.0 standard drinks of alcohol     Types: 15 Cans of beer per week     Comment:  may 17th, 2022     Drug use: Yes  Types: Marijuana     Comment:  a joint or 2 a day     Sexual activity: Yes   Other Topics Concern    Ability to Walk 1 Flight of Steps without SOB/CP Yes    Routine Exercise Not Asked    Ability to Walk 2 Flight of Steps without SOB/CP No     Comment: SOB no CP    Unable to Ambulate Not Asked    Total Care Not Asked    Ability To Do Own ADL's Yes    Uses Walker No    Other Activity Level Not Asked    Uses Cane No   Social History Narrative    Not on file     Social Determinants of Health     Financial Resource Strain: Low Risk  (01/28/2024)    Financial Resource Strain     SDOH Financial: No   Transportation Needs: Low Risk  (01/28/2024)    Transportation Needs     SDOH Transportation: No   Social Connections: Low Risk  (01/28/2024)    Social Connections     SDOH Social Isolation: 5 or more times a week   Intimate Partner Violence: Low Risk  (01/28/2024)    Intimate Partner Violence     SDOH Domestic Violence: No   Housing Stability: Low Risk  (01/28/2024)    Housing Stability     SDOH Housing Situation: I have housing.     SDOH Housing Worry: No     Medications:   No current outpatient medications on file.     Allergies:   Allergies   Allergen Reactions    Bee Venom Protein (Honey Bee)      Bee stings       ROS:  All 10 systems were reviewed.  There were pertinent positives in: Genital urinary system  There are no new changes to the prior reviewed review of systems.    Physical Exam:  There were no vitals taken for this  visit.      General - alert/oriented; NAD  Neck - supple, trachea midline  Lungs - respirations are unlabored, good inspiratory effort  Heart - RRR, extremity pulses intact  Abdomen - soft, NT/ND  Neurological - CN II-XII Grossly intact  GU system - deferred. Plan to examine in OR    Assessment:  63 y.o. male with   Gross hematuria  Concern for bladder mass    Plan:  To OR for TURBT, possible right URS  Consent obtained  They understand the risks of the procedure which would include bleeding, infection, recurrence, damage to the surrounding structures, failure to correct pathology, loss of sensation, heart attack, stroke, and death.   All questions of patient and family answered      Frazier Richards, MD  [facility]  Department of Urology   PGY3 Resident    02/24/2024 10:37       I saw and examined the patient.  I reviewed the resident's note.   I agree with the findings and plan of care as documented in the resident's note.   Any exceptions/additions are edited/noted.    Mcarthur Rossetti. Mellissa Kohut, MD  Assistant Professor  [facility] Department of Urology  Pager [phone]  02/24/2024, 12:58

## 2024-02-24 NOTE — OR Surgeon (Signed)
 [facility]   DEPARTMENT OF UROLOGY   OPERATION SUMMARY     PATIENT NAME: Hector [name] [facility] NUMBER: [id number]   DATE OF SERVICE: 02/24/2024   DATE OF BIRTH: 1961/05/03     PREOPERATIVE DIAGNOSIS:   Gross hematuria  Concern for bladder mass    POSTOPERATIVE DIAGNOSIS:  1. 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 Rigid cystourethroscopy   2. Bipolar transurethral resection of bladder tumor (TURBT large > 5 cm)  3. Fulguration of resection bed   4. Right diagnostic ureteroscopy  5. MD foley catheter placement    FINDINGS:  1. There was a large papillary bladder mass, > 6 cm, extending across the posterior bladder wall from lateral to the right ureteral orifice to lateral to the left ureteral orifice. This was resected to the level of the muscle. Resection was visually complete. Grossly, the mass appeared muscle-invasive. No concern for CIS.  2. The mass completely occluded both ureteral orifices. We did resect over both ureteral orifices and were able to identify them. They were widely patent. Indeed, we were able to cannulate the right ureter with the resectoscope and it did appear grossly normal (image 2 below, top two pictures). Likewise, the left ureteral orifice was patent with no evidence of disease extending to the upper tract (image 2 below, bottom left picture). No stents were left as both ureters were deemed patent enough to drain without issue.  3. Hemostasis at the end of the case was excellent  4. Placement of 18 Fr foley catheter with 20 cc in the balloon    SURGEON(S): Aviva Signs, MD  RESIDENT(S): Santiago Bumpers, MD    ESTIMATED BLOOD LOSS: Minimal   COMPLICATIONS: None  ANESTHESIA: General anesthesia    SPECIMENS: Bladder tumor  TUBES: 18 Fr Foley catheter     INDICATIONS FOR PROCEDURE: 63 y.o. male with gross hematuria and concern for bladder mass presents for TURBT. Possible URS given  CT findings concerning for ureteral involvement     DESCRIPTION OF PROCEDURE: The patient was consented preoperatively. All risks and benefits of the procedure were explained which include, damage to the ureter, urethra, bladder which could require future and/or emergent surgery to fix, ureteral or urethral stricture, bleeding, infection, bladder perforation requiring prolonged catheterization and/or surgery, failure to resect all of the tumor, and need for another surgery.  Complications from anesthesia which include heart attack, stroke, blood clot, pulmonary embolus, prolonged mechanical ventilation, and rare instances death. There are also risks of nerve damage from patient positioning.     Patient was then brought back to the operative theater and placed on the operating table. General anesthesia was induced. The patient was placed in dorsal lithotomy position with pressure points well padded. Genitalia were prepped and draped in usual sterile fashion. A surgical timeout was performed identifying the correct patient, date of birth, medical record number, surgical site, allergies, and surgical procedure.     Using a 22-French sheath and 30-degrees lens, we inserted the cystoscope into the urethra and into the bladder under direct visualization. Once inside the bladder, panendoscopic views of the bladder were obtained including anterior lateral walls, floor, trigone and dome. There was diffuse papillary bladder mass as described above. The ureteral orifices were not visible as the mass covered them We then exchanged the cystoscope for a 25 Fr Bipolar resectoscope with the Super Loop. The bladder  tumor was resected until muscle fibers were present. These were sent a 4 separate specimens. After resection, both ureteral orifices were easily visible. Indeed, we were able to cannulate the right ureter with the resectoscope and could see to approximately the mid-ureter and no suspicious masses or lesions were present.  No stents were left as both ureters were deemed patent enough to drain without issue. Spot coagulation was used for hemostasis.  At this point the resectoscope was removed and a 18 Fr foley catheter was inserted and the balloon was inflated with 10 mL of sterile normal saline. The bladder was emptied and the procedure was terminated. The patient tolerated the procedure well with no complications. Dr. Aviva Signs was present and participated in the entire procedure.           DISPOSITION: To PACU in stable condition      Frazier Richards, MD   [facility]  Department of Urology    PGY3 Resident  02/24/2024, 14:10         I was present and participated in all aspects of the patient's case.   I reviewed the resident's note.   I agree with the findings and plan of care as documented in the resident's note.   Any exceptions/additions are edited/noted.    Mcarthur Rossetti. Mellissa Kohut, MD  Assistant Professor  [facility] Department of Urology  Pager [phone]  02/24/2024, 15:33

## 2024-02-24 NOTE — Discharge Summary (Signed)
 [facility]  Department of Urology  Discharge Day Note    Hector [name]  Date of service: 02/24/2024  Date of Admission:  02/24/2024  Hospital Day:  LOS: 0 days     Examination at discharge: stable  Vital Signs: Temperature: 36.2 C (97.2 F) (02/24/24 1041)  Heart Rate: 59 (02/24/24 1041)  BP (Non-Invasive): (!) 145/71 (02/24/24 1041)  Respiratory Rate: 20 (02/24/24 1041)  SpO2: 95 % (02/24/24 1041)  General: No distress    Assessment:   63 y.o. male s/p TURBT, right URS    Disposition/Plan :   -Home discharge    -Follow up in 2 weeks for path review with uro onc  -Foley out at home in 1 week, 20 cc in balloon  -Bactrim for foley duration  -Levsin, Toradol prn      Frazier Richards, MD  [facility]  Department of Urology  PGY3 Resident    02/24/2024 14:18      I saw and examined the patient.  I reviewed the resident's note.   I agree with the findings and plan of care as documented in the resident's note.   Any exceptions/additions are edited/noted.    Mcarthur Rossetti. Mellissa Kohut, MD  Assistant Professor  [facility] Department of Urology  Pager [phone]  02/24/2024, 14:40

## 2024-02-24 NOTE — Discharge Instructions (Addendum)
 SURGICAL DISCHARGE INSTRUCTIONS     Dr. Aviva Signs, MD  performed your DIAGNOSTIC URETEROSCOPY, RESECTION TUMOR BLADDER TRANSURETHRAL today at the [facility]    [facility]:  Monday through Friday from 6 a.m. - 7 p.m.: [phone]  Between 7 p.m. - 6 a.m., weekends and holidays:  Call [facility] at [phone] or [phone].    PLEASE SEE WRITTEN HANDOUTS AS DISCUSSED BY YOUR NURSE:      SIGNS AND SYMPTOMS OF A WOUND / INCISION INFECTION   Be sure to watch for the following:  Increase in redness or red streaks near or around the wound or incision.  Increase in pain that is intense or severe and cannot be relieved by the pain medication that your doctor has given you.  Increase in swelling that cannot be relieved by elevation of a body part, or by applying ice, if permitted.  Increase in drainage, or if yellow / green in color and smells bad. This could be on a dressing or a cast.  Increase in fever for longer than 24 hours, or an increase that is higher than 101 degrees Fahrenheit (normal body temperature is 98 degrees Fahrenheit). The incision may feel warm to the touch.    **CALL YOUR DOCTOR IF ONE OR MORE OF THESE SIGNS / SYMPTOMS SHOULD OCCUR.    ANESTHESIA INFORMATION   ANESTHESIA -- ADULT PATIENTS:  You have received intravenous sedation / general anesthesia, and you may feel drowsy and light-headed for several hours. You may even experience some forgetfulness of the procedure. DO NOT DRIVE A MOTOR VEHICLE or perform any activity requiring complete alertness or coordination until you feel fully awake in about 24-48 hours. Do not drink alcoholic beverages for at least 24 hours. Do not stay alone, you must have a responsible adult available to be with you. You may also experience a dry mouth or nausea for 24 hours. This is a normal side effect and will disappear as the effects of the medication wear off.    REMEMBER   If you experience any difficulty breathing, chest  pain, bleeding that you feel is excessive, persistent nausea or vomiting or for any other concerns:  Call your physician Dr. Mellissa Kohut at [phone] or [phone]. You may also ask to have the Urology doctor on call paged. They are available to you 24 hours a day.    SPECIAL INSTRUCTIONS / COMMENTS     Remove catheter on 03/02/2024    FOLLOW-UP APPOINTMENTS   Please call patient services at [phone] or [phone] to schedule a date / time of return. They are open Monday - Friday from 7:30 am - 5:00 pm.

## 2024-02-24 NOTE — Anesthesia Transfer of Care (Signed)
 ANESTHESIA TRANSFER OF CARE   Hector [name] is a 63 y.o. ,male, Weight: 102 kg (223 lb 15.8 oz)   had Procedure(s):  DIAGNOSTIC URETEROSCOPY  RESECTION TUMOR BLADDER TRANSURETHRAL  performed  02/24/24   Primary Service: Aviva Signs, *    Past Medical History:   Diagnosis Date    Alcohol abuse     Carotid stenosis     right side stent    Chronic obstructive airway disease (CMS HCC)     Cirrhosis (CMS HCC)     Closed fracture of transverse process of thoracic vertebra (CMS HCC) 05/01/2021    Coma (CMS HCC)     after fall due to alcholism May 2022    Delirium, withdrawal, alcoholic (CMS HCC)     Depression     Encephalopathy 05/15/2021    Esophageal reflux     H/O urinary tract infection     1/25    HTN (hypertension)     Hyperlipidemia     Ileus (CMS HCC) 05/18/2021    Left pulmonary contusion 05/01/2021    Leukopenia 05/18/2021    Multiple rib fractures 05/01/2021    Oxygen dependent     Pleural effusion 05/11/2021    Serum ammonia increased (CMS HCC) 05/15/2021    Shortness of breath     Spleen injury 05/01/2021    Status post thoracentesis 05/19/2021    Thrombocytopenia (CMS HCC) 05/01/2021    Unknown cause of injury     fx nose    Wears glasses       Allergy History as of 02/24/24       BEE VENOM PROTEIN (HONEY BEE)         Noted Status Severity Type Reaction    05/01/21 1124 [place] Hun, LPN 60/10/93 Active       Comments: Bee stings                   I completed my transfer of care / handoff to the receiving personnel during which we discussed:  Access, Airway, All key/critical aspects of case discussed, Analgesia, Antibiotics, Expectation of post procedure, Fluids/Product, Gave opportunity for questions and acknowledgement of understanding, Labs and PMHx      Post Location: PACU                        Additional Info:Patient transferred to PACU on 6L SM. Patent Airway, Respirations regular, VSS, and following commands. Report given to bedside RN.                                     Last OR Temp:  Temperature: 36.5 C (97.7 F)  ABG:  PH (ARTERIAL)   Date Value Ref Range Status   05/19/2021 7.43 7.35 - 7.45 Final     PH (T)   Date Value Ref Range Status   05/19/2021 7.42 7.35 - 7.45 Final     PCO2 (ARTERIAL)   Date Value Ref Range Status   05/19/2021 29 (L) 35 - 45 mm/Hg Final     PO2 (ARTERIAL)   Date Value Ref Range Status   05/19/2021 70 (L) 72 - 100 mm/Hg Final     SODIUM   Date Value Ref Range Status   05/19/2021 142 137 - 145 mmol/L Final     POTASSIUM   Date Value Ref Range Status   01/20/2024 3.9 3.5 - 5.1 mmol/L  Final     KETONES   Date Value Ref Range Status   01/15/2024 Trace (A) Negative mg/dL Final     WHOLE BLOOD POTASSIUM   Date Value Ref Range Status   05/19/2021 4.3 3.5 - 4.6 mmol/L Final     CHLORIDE   Date Value Ref Range Status   05/19/2021 115 (H) 101 - 111 mmol/L Final     CALCIUM   Date Value Ref Range Status   01/20/2024 8.2 (L) 8.4 - 10.2 mg/dL Final     Calculated P Axis   Date Value Ref Range Status   01/05/2024 65 degrees Final     Calculated R Axis   Date Value Ref Range Status   01/05/2024 55 degrees Final     Calculated T Axis   Date Value Ref Range Status   01/05/2024 49 degrees Final     IONIZED CALCIUM   Date Value Ref Range Status   05/19/2021 1.22 1.10 - 1.35 mmol/L Final     LACTATE   Date Value Ref Range Status   05/19/2021 0.8 0.0 - 1.3 mmol/L Final     HEMOGLOBIN   Date Value Ref Range Status   05/19/2021 10.3 (L) 12.0 - 18.0 g/dL Final     OXYHEMOGLOBIN   Date Value Ref Range Status   05/19/2021 92.8 85.0 - 98.0 % Final     CARBOXYHEMOGLOBIN   Date Value Ref Range Status   05/19/2021 2.1 0.0 - 2.5 % Final     MET-HEMOGLOBIN   Date Value Ref Range Status   05/19/2021 1.3 0.0 - 2.0 % Final     BASE EXCESS (ARTERIAL)   Date Value Ref Range Status   05/17/2021 0.1 0.0 - 1.0 mmol/L Final     BASE DEFICIT   Date Value Ref Range Status   05/19/2021 4.2 (H) 0.0 - 3.0 mmol/L Final     BICARBONATE (ARTERIAL)   Date Value Ref Range Status   05/19/2021 21.6 18.0 - 26.0 mmol/L  Final     TEMPERATURE, COMP   Date Value Ref Range Status   05/19/2021 37.0   C Final     Airway:  EndoTracheal Tube (Active)     Blood pressure (!) 144/60, pulse 65, temperature 36.5 C (97.7 F), resp. rate (!) 26, height 1.753 m (5\' 9" ), weight 102 kg (223 lb 15.8 oz), SpO2 91%.

## 2024-02-25 ENCOUNTER — Emergency Department
Admission: EM | Admit: 2024-02-25 | Discharge: 2024-02-25 | Disposition: A | Discharge status: Home / Self Care | Payer: MEDICAID | Attending: Emergency Medicine | Admitting: Emergency Medicine

## 2024-02-25 ENCOUNTER — Other Ambulatory Visit: Payer: Self-pay

## 2024-02-25 DIAGNOSIS — Z960 Presence of urogenital implants: Secondary | ICD-10-CM | POA: Insufficient documentation

## 2024-02-25 DIAGNOSIS — R319 Hematuria, unspecified: Secondary | ICD-10-CM | POA: Insufficient documentation

## 2024-02-25 DIAGNOSIS — C679 Malignant neoplasm of bladder, unspecified: Secondary | ICD-10-CM

## 2024-02-25 DIAGNOSIS — Z9889 Other specified postprocedural states: Secondary | ICD-10-CM | POA: Insufficient documentation

## 2024-02-25 LAB — SURGICAL PATHOLOGY SPECIMEN

## 2024-02-25 NOTE — ED Nurses Note (Signed)

## 2024-02-25 NOTE — ED Provider Notes (Shared)
 [facility]  Emergency Department      Name: Hector [name]  Age and Gender: 63 y.o. male  Date of Birth: [birth year]  Date of Service: 02/25/2024   MRN: [id number]  PCP: Robin Searing, MD    No chief complaint on file.      HPI:    Hector [name] is a 63 y.o. male presenting with hematuria.    Pt is a 63 y.o male presenting to the ED with hematuria. Pt had a tumor removed form his bladder yesterday and has bee having hematuria since. He was unable to drain his foley catheter, prompting his visit. Pt denies any CP, SOB, fever, chills, nausea, vomiting, diarrhea, abdominal pain, or any other worrisome symptoms.       ROS:  All systems reviewed and are negative, unless stated in the HPI.      Below pertinent information reviewed with patient and/or EMR:  Past Medical History:   Diagnosis Date    Alcohol abuse     Carotid stenosis     right side stent    Chronic obstructive airway disease (CMS HCC)     Cirrhosis (CMS HCC)     Closed fracture of transverse process of thoracic vertebra (CMS HCC) 05/01/2021    Coma (CMS HCC)     after fall due to alcholism May 2022    Delirium, withdrawal, alcoholic (CMS HCC)     Depression     Encephalopathy 05/15/2021    Esophageal reflux     H/O urinary tract infection     1/25    HTN (hypertension)     Hyperlipidemia     Ileus (CMS HCC) 05/18/2021    Left pulmonary contusion 05/01/2021    Leukopenia 05/18/2021    Multiple rib fractures 05/01/2021    Oxygen dependent     Pleural effusion 05/11/2021    Serum ammonia increased (CMS HCC) 05/15/2021    Shortness of breath     Spleen injury 05/01/2021    Status post thoracentesis 05/19/2021    Thrombocytopenia (CMS HCC) 05/01/2021    Unknown cause of injury     fx nose    Wears glasses       Cannot display prior to admission medications because the patient has not been admitted in this contact.          Allergies   Allergen Reactions    Bee Venom Protein (Honey Bee)      Bee stings     Past Surgical History:   Procedure Laterality  Date    CAROTID STENT      COLONOSCOPY  08/2021    ESOPHAGOGASTRODUODENOSCOPY  08/2021    HX OTHER Right 06/29/2023    TCAR    HX TONSILLECTOMY      OTHER SURGICAL HISTORY      HX of plastic surgery to right head     Family Medical History:       Problem Relation (Age of Onset)    Asthma Father    Cerebral Aneurysm Sister    Dementia Mother    Heart Attack Paternal Grandmother (42)          Social History     Tobacco Use    Smoking status: Every Day     Current packs/day: 1.00     Average packs/day: 1 pack/day for 51.2 years (51.2 ttl pk-yrs)     Types: Cigarettes     Start date: 79    Smokeless tobacco: Never  Tobacco comments:     THE PATIENT STARTED    Vaping Use    Vaping status: Never Used   Substance Use Topics    Alcohol use: Not Currently     Alcohol/week: 15.0 standard drinks of alcohol     Types: 15 Cans of beer per week     Comment:  may 17th, 2022     Drug use: Yes     Types: Marijuana     Comment:  a joint or 2 a day        Objective:  ED Triage Vitals   BP    Pulse    Resp    Temp    SpO2    Weight    Height      Nursing notes and vital signs reviewed.    Constitutional:  Alert and oriented to person, place, and time.   HENT:   Head: Normocephalic and atraumatic.   Mouth/Throat: Oropharynx is clear and moist.   Eyes: EOMI, PERRL   Neck: Trachea midline. Neck supple.  Cardiovascular: RRR, No murmurs, rubs or gallops. Intact distal pulses and no edema.  Pulmonary/Chest: BS equal bilaterally. No respiratory distress. No wheezes, rales or chest tenderness.   Abdominal: BS +. Abdomen soft, no tenderness, rebound or guarding.  Male GU:  Foley in place with bloody bag.   Back: No midline spinal tenderness, no paraspinal tenderness, no CVA tenderness.           Musculoskeletal: No tenderness or deformity.  Skin: warm and dry. No rash, erythema, pallor or cyanosis  Psychiatric: normal mood and affect. Behavior is normal.   Neurological: Patient keenly alert and responsive, CN II-XII grossly intact, moving  all extremities equally and fully    Labs:   Labs Ordered/Reviewed - No data to display    Imaging:  No orders to display       EKG: ***  No results found for this visit on 02/25/24 (from the past 720 hours).    MDM/Course:  Medical Decision Making           Clinical Impression:     Clinical Impression   Hematuria, unspecified type (Primary)       Medications given:           Disposition: Discharged       Current Discharge Medication List        CONTINUE these medications - NO CHANGES were made during your visit.        Details   albuterol sulfate 90 mcg/actuation oral inhaler  Commonly known as: Ventolin HFA   2 Puffs, Inhalation, EVERY 6 HOURS PRN  Qty: 1 Each  Refills: 5     fluticasone propion-salmeteroL 230-21 mcg/actuation oral inhaler  Commonly known as: Advair HFA   2 Puffs, Inhalation, 2 TIMES DAILY, RINSE MOUTH WITH WATER AFTER INHALER USE  Qty: 1 g  Refills: 5     hyoscyamine sulfate 0.125 mg Tablet  Commonly known as: LEVSIN   0.125 mg, Oral, EVERY 4 HOURS PRN  Qty: 20 Tablet  Refills: 0     ipratropium-albuteroL 0.5 mg-3 mg(2.5 mg base)/3 mL nebulizer solution  Commonly known as: DUONEB   3 mL, Nebulization, 3 TIMES DAILY PRN  Qty: 270 mL  Refills: 1     ketorolac tromethamine 10 mg Tablet  Commonly known as: TORADOL   10 mg, Oral, EVERY 6 HOURS PRN  Qty: 15 Tablet  Refills: 0     OLANZapine 10 mg Tablet  Commonly known as: zyPREXA   10 mg, NIGHTLY  Refills: 0     oxygen gas  Commonly known as: o2   4 L/min, CONTINUOUS  Refills: 0     pantoprazole 40 mg Tablet, Delayed Release (E.C.)  Commonly known as: PROTONIX   TAKE 1 TABLET (40 MG TOTAL) BY MOUTH TWICE A DAY 30 MINTUTES BEFORE MEALS FOR 90 DAYS  Qty: 60 Tablet  Refills: 3     rosuvastatin 10 mg Tablet  Commonly known as: CRESTOR   10 mg, Oral, EVERY EVENING  Qty: 90 Tablet  Refills: 1     sulfamethoxazole-trimethoprim 800-160 mg tablet  Commonly known as: BACTRIM DS   160 mg, Oral, EVERY 24 HOURS  Qty: 7 Tablet  Refills: 0     traZODone 50 mg  Tablet  Commonly known as: DESYREL   50 mg, NIGHTLY  Refills: 0            ASK your doctor about these medications.        Details   mirabegron 25 mg Tablet Sustained Release 24 hr  Commonly known as: MYRBETRIQ  Ask about: Should I take this medication?   25 mg, Oral, DAILY  Qty: 30 Tablet  Refills: 0     nitrofurantoin monohyd/m-cryst 100 mg Capsule  Commonly known as: Macrobid  Ask about: Should I take this medication?   100 mg, Oral, 2 TIMES DAILY  Qty: 14 Capsule  Refills: 0              Follow up: Robin Searing, MD  [address]  [address]  [place] [address]  [phone]            Parts of this patients chart were completed in a retrospective fashion due to simultaneous direct patient care activities in the Emergency Department.   This note was partially generated using MModal Fluency Direct system, and there may be some incorrect words, spellings, and punctuation that were not noted in checking the note before saving.      I am scribing for, and in the presence of, Dr. Quintella Baton for services provided on 02/25/2024.  Tana Conch, SCRIBE    Tana Conch, SCRIBE  02/25/2024, 17:14     ***don't forget to tgscribe and sign***

## 2024-02-25 NOTE — ED Triage Notes (Signed)
 Pt presents to the ED wanting someone to help drain his bag. Pts cath bag filled with blood d/t recent surgery.

## 2024-02-26 ENCOUNTER — Observation Stay
Admission: EM | Admit: 2024-02-26 | Discharge: 2024-02-28 | Disposition: A | Discharge status: Other Acute Inpatient Hospital | Payer: MEDICAID | Attending: Family Medicine | Admitting: Family Medicine

## 2024-02-26 ENCOUNTER — Observation Stay (HOSPITAL_COMMUNITY): Payer: MEDICAID | Admitting: Family Medicine

## 2024-02-26 ENCOUNTER — Encounter (HOSPITAL_COMMUNITY): Payer: Self-pay | Admitting: Family Medicine

## 2024-02-26 DIAGNOSIS — R161 Splenomegaly, not elsewhere classified: Secondary | ICD-10-CM | POA: Insufficient documentation

## 2024-02-26 DIAGNOSIS — J984 Other disorders of lung: Secondary | ICD-10-CM | POA: Insufficient documentation

## 2024-02-26 DIAGNOSIS — Z8551 Personal history of malignant neoplasm of bladder: Secondary | ICD-10-CM | POA: Insufficient documentation

## 2024-02-26 DIAGNOSIS — F17210 Nicotine dependence, cigarettes, uncomplicated: Secondary | ICD-10-CM | POA: Insufficient documentation

## 2024-02-26 DIAGNOSIS — I864 Gastric varices: Secondary | ICD-10-CM | POA: Insufficient documentation

## 2024-02-26 DIAGNOSIS — Z8249 Family history of ischemic heart disease and other diseases of the circulatory system: Secondary | ICD-10-CM | POA: Insufficient documentation

## 2024-02-26 DIAGNOSIS — C679 Malignant neoplasm of bladder, unspecified: Secondary | ICD-10-CM

## 2024-02-26 DIAGNOSIS — D696 Thrombocytopenia, unspecified: Secondary | ICD-10-CM | POA: Insufficient documentation

## 2024-02-26 DIAGNOSIS — J90 Pleural effusion, not elsewhere classified: Secondary | ICD-10-CM | POA: Insufficient documentation

## 2024-02-26 DIAGNOSIS — J9610 Chronic respiratory failure, unspecified whether with hypoxia or hypercapnia: Secondary | ICD-10-CM

## 2024-02-26 DIAGNOSIS — I779 Disorder of arteries and arterioles, unspecified: Secondary | ICD-10-CM | POA: Insufficient documentation

## 2024-02-26 DIAGNOSIS — K7460 Unspecified cirrhosis of liver: Secondary | ICD-10-CM | POA: Insufficient documentation

## 2024-02-26 DIAGNOSIS — T83091A Other mechanical complication of indwelling urethral catheter, initial encounter: Principal | ICD-10-CM | POA: Insufficient documentation

## 2024-02-26 DIAGNOSIS — Y846 Urinary catheterization as the cause of abnormal reaction of the patient, or of later complication, without mention of misadventure at the time of the procedure: Secondary | ICD-10-CM | POA: Insufficient documentation

## 2024-02-26 DIAGNOSIS — R319 Hematuria, unspecified: Principal | ICD-10-CM | POA: Diagnosis present

## 2024-02-26 DIAGNOSIS — R310 Gross hematuria: Secondary | ICD-10-CM | POA: Insufficient documentation

## 2024-02-26 DIAGNOSIS — J449 Chronic obstructive pulmonary disease, unspecified: Secondary | ICD-10-CM | POA: Insufficient documentation

## 2024-02-26 DIAGNOSIS — E7800 Pure hypercholesterolemia, unspecified: Secondary | ICD-10-CM | POA: Insufficient documentation

## 2024-02-26 DIAGNOSIS — K219 Gastro-esophageal reflux disease without esophagitis: Secondary | ICD-10-CM | POA: Insufficient documentation

## 2024-02-26 DIAGNOSIS — J9611 Chronic respiratory failure with hypoxia: Secondary | ICD-10-CM | POA: Insufficient documentation

## 2024-02-26 DIAGNOSIS — I8510 Secondary esophageal varices without bleeding: Secondary | ICD-10-CM | POA: Insufficient documentation

## 2024-02-26 DIAGNOSIS — D649 Anemia, unspecified: Secondary | ICD-10-CM

## 2024-02-26 DIAGNOSIS — Z906 Acquired absence of other parts of urinary tract: Secondary | ICD-10-CM | POA: Insufficient documentation

## 2024-02-26 DIAGNOSIS — Z79899 Other long term (current) drug therapy: Secondary | ICD-10-CM | POA: Insufficient documentation

## 2024-02-26 DIAGNOSIS — F419 Anxiety disorder, unspecified: Secondary | ICD-10-CM | POA: Insufficient documentation

## 2024-02-26 DIAGNOSIS — F32A Depression, unspecified: Secondary | ICD-10-CM | POA: Insufficient documentation

## 2024-02-26 DIAGNOSIS — D638 Anemia in other chronic diseases classified elsewhere: Secondary | ICD-10-CM | POA: Insufficient documentation

## 2024-02-26 DIAGNOSIS — I7090 Unspecified atherosclerosis: Secondary | ICD-10-CM | POA: Insufficient documentation

## 2024-02-26 MED ORDER — LIDOCAINE 2 % MUCOSAL JELLY IN APPLICATOR
Status: AC
Start: 2024-02-26 — End: 2024-02-26
  Filled 2024-02-26: qty 6

## 2024-02-26 MED ORDER — IPRATROPIUM 0.5 MG-ALBUTEROL 3 MG (2.5 MG BASE)/3 ML NEBULIZATION SOLN
3.0000 mL | INHALATION_SOLUTION | Freq: Three times a day (TID) | RESPIRATORY_TRACT | Status: DC | PRN
Start: 2024-02-26 — End: 2024-02-28

## 2024-02-26 MED ORDER — OLANZAPINE 10 MG TABLET
10.0000 mg | ORAL_TABLET | Freq: Every evening | ORAL | Status: DC
Start: 2024-02-26 — End: 2024-02-28
  Administered 2024-02-26 – 2024-02-27 (×2): 10 mg via ORAL
  Filled 2024-02-26 (×2): qty 1

## 2024-02-26 MED ORDER — TRAZODONE 50 MG TABLET
50.0000 mg | ORAL_TABLET | Freq: Every evening | ORAL | Status: DC
Start: 2024-02-26 — End: 2024-02-28
  Administered 2024-02-26 – 2024-02-27 (×2): 50 mg via ORAL
  Filled 2024-02-26 (×2): qty 1

## 2024-02-26 MED ORDER — PANTOPRAZOLE 40 MG TABLET,DELAYED RELEASE
40.0000 mg | DELAYED_RELEASE_TABLET | Freq: Every day | ORAL | Status: DC
Start: 2024-02-27 — End: 2024-02-28

## 2024-02-26 MED ORDER — SODIUM CHLORIDE 0.9 % IRRIGATION SOLUTION
3000.0000 mL | Status: DC
Start: 2024-02-26 — End: 2024-02-28
  Administered 2024-02-26: 3000 mL

## 2024-02-26 MED ORDER — SODIUM CHLORIDE 0.9 % (FLUSH) INJECTION SYRINGE
10.0000 mL | INJECTION | Freq: Three times a day (TID) | INTRAMUSCULAR | Status: DC
Start: 2024-02-26 — End: 2024-02-28
  Administered 2024-02-26 – 2024-02-28 (×5): 0 mL

## 2024-02-26 MED ORDER — BUDESONIDE 0.5 MG/2 ML SUSPENSION FOR NEBULIZATION
0.5000 mg | INHALATION_SUSPENSION | Freq: Two times a day (BID) | RESPIRATORY_TRACT | Status: DC
Start: 2024-02-26 — End: 2024-02-28
  Administered 2024-02-26: 0 mg via RESPIRATORY_TRACT
  Administered 2024-02-27 – 2024-02-28 (×3): 0.5 mg via RESPIRATORY_TRACT
  Filled 2024-02-26 (×4): qty 2

## 2024-02-26 MED ORDER — ALBUTEROL SULFATE 2.5 MG/3 ML (0.083 %) SOLUTION FOR NEBULIZATION
2.5000 mg | INHALATION_SOLUTION | RESPIRATORY_TRACT | Status: DC | PRN
Start: 2024-02-26 — End: 2024-02-28

## 2024-02-26 MED ORDER — SODIUM CHLORIDE 0.9 % (FLUSH) INJECTION SYRINGE
10.0000 mL | INJECTION | INTRAMUSCULAR | Status: DC | PRN
Start: 2024-02-26 — End: 2024-02-28

## 2024-02-26 MED ORDER — ARFORMOTEROL 15 MCG/2 ML SOLUTION FOR NEBULIZATION
15.0000 ug | INHALATION_SOLUTION | Freq: Two times a day (BID) | RESPIRATORY_TRACT | Status: DC
Start: 2024-02-26 — End: 2024-02-28
  Administered 2024-02-26: 0 ug via RESPIRATORY_TRACT
  Administered 2024-02-27 – 2024-02-28 (×3): 15 ug via RESPIRATORY_TRACT
  Filled 2024-02-26 (×4): qty 1

## 2024-02-26 MED ORDER — MIRABEGRON ER 25 MG TABLET,EXTENDED RELEASE 24 HR
25.0000 mg | ORAL_TABLET | Freq: Every day | ORAL | Status: DC
Start: 2024-02-27 — End: 2024-02-28

## 2024-02-26 MED ORDER — ROSUVASTATIN 10 MG TABLET
10.0000 mg | ORAL_TABLET | Freq: Every evening | ORAL | Status: DC
Start: 2024-02-26 — End: 2024-02-28
  Administered 2024-02-26 – 2024-02-27 (×2): 10 mg via ORAL
  Filled 2024-02-26 (×2): qty 1

## 2024-02-26 MED ORDER — HYOSCYAMINE SULFATE 0.125 MG TABLET
0.1250 mg | ORAL_TABLET | ORAL | Status: DC | PRN
Start: 2024-02-26 — End: 2024-02-26

## 2024-02-26 MED ORDER — DEXTROSE 5% IN WATER (D5W) FLUSH BAG - 250 ML
INTRAVENOUS | Status: DC | PRN
Start: 2024-02-26 — End: 2024-02-28

## 2024-02-26 MED ORDER — SODIUM CHLORIDE 0.9% FLUSH BAG - 250 ML
INTRAVENOUS | Status: DC | PRN
Start: 2024-02-26 — End: 2024-02-28

## 2024-02-26 MED ORDER — SULFAMETHOXAZOLE 800 MG-TRIMETHOPRIM 160 MG TABLET
1.0000 | ORAL_TABLET | ORAL | Status: DC
Start: 2024-02-26 — End: 2024-03-02
  Administered 2024-02-26 – 2024-02-27 (×2): 160 mg via ORAL
  Filled 2024-02-26 (×2): qty 1

## 2024-02-26 NOTE — ED Nurses Note (Signed)
 Report called to floor RN

## 2024-02-26 NOTE — ED Triage Notes (Signed)
 Pt reports to the ED for c/o cathter problem. States that around 0900 he noticed blood coming from the cathter. States that he noticed clots coming through the cathter. Wife reports that he was seen yesterday and they flushed the cathter to get the clots out. States that he recently had a bladder tumor removed.

## 2024-02-26 NOTE — Consults (Signed)
 [facility]      Name: Hector [name] MRN:  [id number]   Date: 02/26/2024 Age: 63 y.o.        HISTORY OF PRESENT ILLNESS:  This 63 year old has a several month history of hematuria.  He underwent transurethral resection bladder tumor at [facility] 2 days ago.  He was sent home the same day as the procedure.  He had hematuria yesterday and was irrigated and sent home from the emergency room.  He has bleeding today.  He denies fever    CT urogram in January showed a soft tissue fullness 1 point cm in size at the level of the right ureterovesical junction.  There was no hydronephrosis.    Pathology showed high-grade T1 bladder cancer.    MEDS:  Albuterol   Toradol   Myrbetriq   Macrobid   Zyprexa   Crestor   Trazodone   Protonix  Patient has not been on Plavix since his last admission    ALLERGIES:  None    SOCIAL HX:  Patient smokes    FAMILY HX:  Unremarkable    PAST SURGICAL HX:  Carotid stent  transurethral resection bladder tumor    PAST MEDICAL HX:  High-grade T1 bladder cancer   Alcohol abuse   Hypertension   Elevated cholesterol   Thrombocytopenia  History of cirrhosis  Chronic obstructive pulmonary disease with cigarette use    PHYSICAL EXAM:  White male lying in bed  Temperature 98.2 pulse 73 blood pressure 151/72      LABS:  White count 7.6   Hemoglobin 12.2   Platelets 88000   Creatinine 0.9   CT scan January of 2025 showed normal kidneys  CT urogram in January showed fullness at the right ureterovesical junction      IMPRESSIONS:  Next  High-grade T1 bladder cancer   Chronic obstructive pulmonary disease and cigarette use   Gross hematuria post TURBT  Thrombocytopenia   History of alcohol abuse with cirrhosis        PLAN:  Urine is cherry.  Would run CBI open.  If patient needs additional nursing or ICU to do this, would consider it.  Consider CT to see if clots in the bladder  Transfer to [facility] as this is a postoperative situation.  Type and cross  Serial CBCs to maintain adequate  hemoglobin  Consider platelet transfusion in view of the active bleeding as per Medicine and Hematology  NPO  No anticoagulants          Consulting Physician: Orlene Och, MD     Orlene Och, MD

## 2024-02-26 NOTE — H&P (Signed)
 [facility]  Department of Family Medicine  Inpatient History and Physical    Name: Hector [name]  Age: 63 y.o.   Gender: male  MRN: [id number]  Date of Admission:  02/26/2024  PCP: Robin Searing, MD  Attending: Terance Ice, DO  Consultants:   Consult Orders Placed This Encounter   Procedures    IP CONSULT TO UROLOGY SEMINS, MICHELLE JO        SUBJECTIVE:      CHIEF COMPLAINT:  Painful hematuria    HISTORY OF PRESENT ILLNESS   Hector [name] is a 63 y.o. male with a PMH of suspected bladder malignancy s/p TURBT, COPD on 3L NC at baseline, HTN, HLD, liver cirrhosis with chronic thrombocytopenia and anemia 2/2 alcohol use disorder in remission,  current smoker admitted for painful hematuria following TURBT procedure and foley placement on 03/12. Patient reports since TURBT he has had persistent hematuria with clots. He has been struggling at home with clots and came to the ER on 03/13 and had manual irrigation to clear the clots. The pain got increasingly worse and he continued to struggle to pass clots so he came to the ER for evaluation.    He discontinued his Plavix and aspirin in February d/t continuous hematuria.     REVIEW OF SYSTEMS:   Review of Systems   Constitutional:  Negative for chills and fever.   HENT:  Negative for congestion, rhinorrhea and sore throat.    Eyes:  Negative for discharge and redness.   Respiratory:  Negative for chest tightness and shortness of breath.    Gastrointestinal:  Negative for abdominal distention, abdominal pain and nausea.   Genitourinary:  Positive for difficulty urinating, hematuria and penile pain.   Neurological:  Negative for dizziness, syncope and light-headedness.     MEDICAL HISTORY     PAST MEDICAL HISTORY:  Past Medical History:   Diagnosis Date    Alcohol abuse     Carotid stenosis     right side stent    Chronic obstructive airway disease (CMS HCC)     Cirrhosis (CMS HCC)     Closed fracture of transverse process of thoracic vertebra  (CMS HCC) 05/01/2021    Coma (CMS HCC)     after fall due to alcholism May 2022    Delirium, withdrawal, alcoholic (CMS HCC)     Depression     Encephalopathy 05/15/2021    Esophageal reflux     H/O urinary tract infection     1/25    HTN (hypertension)     Hyperlipidemia     Ileus (CMS HCC) 05/18/2021    Left pulmonary contusion 05/01/2021    Leukopenia 05/18/2021    Multiple rib fractures 05/01/2021    Oxygen dependent     Pleural effusion 05/11/2021    Serum ammonia increased (CMS HCC) 05/15/2021    Shortness of breath     Spleen injury 05/01/2021    Status post thoracentesis 05/19/2021    Thrombocytopenia (CMS HCC) 05/01/2021    Unknown cause of injury     fx nose    Wears glasses      PAST SURGICAL HISTORY:  Past Surgical History:   Procedure Laterality Date    CAROTID STENT      COLONOSCOPY  08/2021    ESOPHAGOGASTRODUODENOSCOPY  08/2021    HX OTHER Right 06/29/2023    TCAR    HX TONSILLECTOMY      OTHER SURGICAL HISTORY  HX of plastic surgery to right head     FAMILY HISTORY:  Family Medical History:       Problem Relation (Age of Onset)    Asthma Father    Cerebral Aneurysm Sister    Dementia Mother    Heart Attack Paternal Grandmother (34)          SOCIAL HISTORY:  Social History     Tobacco Use   Smoking Status Every Day    Current packs/day: 1.00    Average packs/day: 1 pack/day for 51.2 years (51.2 ttl pk-yrs)    Types: Cigarettes    Start date: 1974   Smokeless Tobacco Never   Tobacco Comments    THE PATIENT STARTED      Social History     Substance and Sexual Activity   Alcohol Use Not Currently    Alcohol/week: 15.0 standard drinks of alcohol    Types: 15 Cans of beer per week    Comment:  may 17th, 2022      Social History     Substance and Sexual Activity   Drug Use Yes    Types: Marijuana    Comment:  a joint or 2 a day      MEDICATIONS:  Current Outpatient Medications   Medication Instructions    albuterol sulfate (VENTOLIN HFA) 90 mcg/actuation Inhalation oral inhaler 2 Puffs, Inhalation,  EVERY 6 HOURS PRN    fluticasone propion-salmeteroL (ADVAIR HFA) 230-21 mcg/actuation Inhalation oral inhaler 2 Puffs, Inhalation, 2 TIMES DAILY, RINSE MOUTH WITH WATER AFTER INHALER USE    hyoscyamine sulfate (LEVSIN) 0.125 mg, Oral, EVERY 4 HOURS PRN    ipratropium-albuterol 0.5 mg-3 mg(2.5 mg base)/3 mL Solution for Nebulization 3 mL, Nebulization, 3 TIMES DAILY PRN    ketorolac tromethamine (TORADOL) 10 mg, Oral, EVERY 6 HOURS PRN    OLANZapine (ZYPREXA) 10 mg, NIGHTLY    oxygen (O2) 4 L/min, CONTINUOUS    pantoprazole (PROTONIX) 40 mg Oral Tablet, Delayed Release (E.C.) TAKE 1 TABLET (40 MG TOTAL) BY MOUTH TWICE A DAY 30 MINTUTES BEFORE MEALS FOR 90 DAYS    rosuvastatin (CRESTOR) 10 mg, Oral, EVERY EVENING    traZODone (DESYREL) 50 mg, NIGHTLY    trimethoprim-sulfamethoxazole (BACTRIM DS) 160-800mg  per tablet 160 mg, Oral, EVERY 24 HOURS      ALLERGIES:  Allergies   Allergen Reactions    Bee Venom Protein (Honey Bee)      Bee stings      OBJECTIVE     BP (!) 158/79   Pulse 76   Temp 36.8 C (98.2 F)   Resp 20   Ht 1.727 m (5\' 8" )   Wt 101 kg (223 lb)   SpO2 95%   BMI 33.91 kg/m     PHYSICAL EXAM:  General: appears chronically ill, appears older than stated age, no distress, and vital signs reviewed. Bright red copious urine in foley catheter, CBI in place  Eyes: Conjunctiva clear., Pupils equal and round.   HENT:Head atraumatic and normocephalic, Nose without erythema. , Mouth mucous membranes dry.   Neck: supple, symmetrical, trachea midline  Lungs: Breathing nonlabored, Clear to auscultation bilaterally.   Cardiovascular: regular rate and rhythm, S1, S2 normal, no murmur, click, rub or gallop  Abdomen: non-distended, Soft, non-tender  Extremities: no edema, redness or tenderness in the calves or thighs  Skin: Skin warm and dry  Psychiatric: Normal affect, behavior, memory, thought content, judgement, and speech.    LABS:    CBC Results Differential Results  Recent Labs     02/26/24  1410   WBC 7.6    HGB 12.2*   HCT 35.8*    Recent Labs     02/26/24  1410   PMNS 63   LYMPHOCYTES 24   MONOCYTES 11   EOSINOPHIL 2   BASOPHILS 1  0.00   PMNABS 4.80   LYMPHSABS 1.80   MONOSABS 0.80   EOSABS 0.20      BMP Results Other Chemistries Results   Recent Labs     02/26/24  1410   SODIUM 140   POTASSIUM 3.6   CHLORIDE 112*   CO2 22   BUN 14   CREATININE 0.97   GFR >60   ANIONGAP 6    Recent Labs     02/26/24  1410   CALCIUM 8.9   ALBUMIN 3.6      Liver/Pancreas Enzyme Results Blood Gas     Recent Labs     02/26/24  1410   TOTALPROTEIN 6.4   ALBUMIN 3.6   AST 32   ALT 24   ALKPHOS 94    No results found for this encounter   Cardiac Results    Coags Results     No results for input(s): "UHCEASTTROPI", "CKMB", "MBINDEX", "BNP" in the last 72 hours. Recent Labs     02/26/24  1410   INR 1.12*        RADIOLOGY RESULTS:        MICROBIOLOGY:  No results found for any visits on 02/26/24 (from the past 96 hours).     ASSESSMENT AND PLAN     Hector [name] is a 63 y.o. male presenting with   Active Hospital Problems    Diagnosis    Primary Problem: Hematuria   High-grade T1 bladder cancer   Gross hematuria post TURBT 3/12  Chronic thrombocytopenia in the setting of cirrhosis 2/2   Anemia  History of alcohol abuse with cirrhosis  -No leukocytosis, does not meet SIRS  -Hgb 12.2   -normal renal function  -Iron studies 1/24 : Ferritin 24, Iron 47, Iron sat 15, TIBC 320   -CBI inserted and running  -Myrbetriq 25mg  po qd  -tramadol 50mg  q 6 hour prn for pain  -continue Bactrim for foley duration per urologist  -The ED spoke with urology in [place] who recommended admission for CBI  -urology consulted              -recommend transfer to tertiary centre              -Q6 CBC              -CT AP    Chronic Resiratory failure 2/2 obstructive pulmonary disease and cigarette use   -on 3L BL  -Continue home albuterol  -Start budensonide/ arformoterol nebs (formulary for home advair)     Atherosclerosis of common carotid artery  -Patient with  HLD and carotid artery disease s/p stenting. Unfortunately patient does not know detail of carotid stenting including timing, and nothing available in transfer paperwork or in CareEverywhere at time of admission.   -continue rosuvastatin 10mg  po daily      GERD  -continue Protonix     # Anxiety and Depression   - Continue home trazodone 50 mg nightly   - Continue home Zyprexa 10 mg nightly     Nicoderm 21mg  po daily    Diet: DIET REGULAR  DVT/PE: SCDs- active bleed  Code Status: SELECTED INTERVENTIONS  Disposition:  TBD    Willeen Cass, DO   02/26/2024, 20:08   Family Medicine Resident, PGY-1  [facility]    I saw and examined the patient with the resident. I reviewed the resident's note. I agree with the physical exam findings and plan of care as documented in the resident's note. Any exceptions/additions are edited/noted.   Terance Ice, DO

## 2024-02-26 NOTE — ED Provider Notes (Signed)
 [facility]  Emergency Department      Name: Hector [name]  Age and Gender: 63 y.o. male  Date of Birth: [birth year]  Date of Service: 02/26/2024   MRN: [id number]  PCP: Robin Searing, MD    Chief Complaint   Patient presents with    Urinary Catheter Problem       HPI:    Hector [name] is a 63 y.o. male presenting with a urinary catheter problem.    Patient arrives to the ED with the chief complaint of a foley catheter problem. The pt recently had a mass removed from his bladder on Wednesday (3/12) by Dr. Mellissa Kohut at [facility]. He was discharged with a foley catheter placed. That evening, the pt started to have hematuria and was unable to drain the foley; thus, he arrived to the ED for evaluation the next morning (3/13). During this visit, the pt's catheter was irrigated and sent home. The pt relates this was only a temporary relief as he is currently experiencing the same symptoms. He is currently prescribed Plavix. Pt denies any CP, SOB, fever, chills, nausea, vomiting, diarrhea, abdominal pain, or any other worrisome symptoms.      ROS:  All systems reviewed and are negative, unless stated in the HPI.      Below pertinent information reviewed with patient and/or EMR:  Past Medical History:   Diagnosis Date    Alcohol abuse     Carotid stenosis     right side stent    Chronic obstructive airway disease (CMS HCC)     Cirrhosis (CMS HCC)     Closed fracture of transverse process of thoracic vertebra (CMS HCC) 05/01/2021    Coma (CMS HCC)     after fall due to alcholism May 2022    Delirium, withdrawal, alcoholic (CMS HCC)     Depression     Encephalopathy 05/15/2021    Esophageal reflux     H/O urinary tract infection     1/25    HTN (hypertension)     Hyperlipidemia     Ileus (CMS HCC) 05/18/2021    Left pulmonary contusion 05/01/2021    Leukopenia 05/18/2021    Multiple rib fractures 05/01/2021    Oxygen dependent     Pleural effusion 05/11/2021    Serum ammonia increased (CMS HCC)  05/15/2021    Shortness of breath     Spleen injury 05/01/2021    Status post thoracentesis 05/19/2021    Thrombocytopenia (CMS HCC) 05/01/2021    Unknown cause of injury     fx nose    Wears glasses      Medications Prior to Admission       Prescriptions    albuterol sulfate (VENTOLIN HFA) 90 mcg/actuation Inhalation oral inhaler    Take 2 Puffs by inhalation Every 6 hours as needed for Other (SHORTNESS OF BREATH,COUGH OR WHEEZING)    fluticasone propion-salmeteroL (ADVAIR HFA) 230-21 mcg/actuation Inhalation oral inhaler    Take 2 Puffs by inhalation Twice daily RINSE MOUTH WITH WATER AFTER INHALER USE    hyoscyamine sulfate (LEVSIN) 0.125 mg Oral Tablet    Take 1 Tablet (0.125 mg total) by mouth Every 4 hours as needed for up to 14 days    ipratropium-albuterol 0.5 mg-3 mg(2.5 mg base)/3 mL Solution for Nebulization    TAKE 3 ML BY NEBULIZATION THREE TIMES A DAY AS NEEDED FOR WHEEZING    ketorolac tromethamine (TORADOL) 10 mg Oral Tablet    Take  1 Tablet (10 mg total) by mouth Every 6 hours as needed for Pain for up to 15 doses    mirabegron (MYRBETRIQ) 25 mg Oral Tablet Sustained Release 24 hr    Take 1 Tablet (25 mg total) by mouth Once a day for 30 days    nitrofurantoin monohyd/m-cryst (MACROBID) 100 mg Oral Capsule    Take 1 Capsule (100 mg total) by mouth Twice daily for 7 days    Patient not taking:  Reported on 02/03/2024    OLANZapine (ZYPREXA) 10 mg Oral Tablet    Take 1 Tablet (10 mg total) by mouth Every night    oxygen (O2) Inhalation gas    Administer 4 L/min into affected nostril(s) continuous    pantoprazole (PROTONIX) 40 mg Oral Tablet, Delayed Release (E.C.)    TAKE 1 TABLET (40 MG TOTAL) BY MOUTH TWICE A DAY 30 MINTUTES BEFORE MEALS FOR 90 DAYS    rosuvastatin (CRESTOR) 10 mg Oral Tablet    TAKE 1 TABLET BY MOUTH EVERY DAY IN THE EVENING    traZODone (DESYREL) 50 mg Oral Tablet    Take 1 Tablet (50 mg total) by mouth Every night Unsure dosage    trimethoprim-sulfamethoxazole (BACTRIM DS)  160-800mg  per tablet    Take 1 Tablet (160 mg total) by mouth Every 24 hours for 7 days          Allergies   Allergen Reactions    Bee Venom Protein (Honey Bee)      Bee stings     Past Surgical History:   Procedure Laterality Date    CAROTID STENT      COLONOSCOPY  08/2021    ESOPHAGOGASTRODUODENOSCOPY  08/2021    HX OTHER Right 06/29/2023    TCAR    HX TONSILLECTOMY      OTHER SURGICAL HISTORY      HX of plastic surgery to right head     Family Medical History:       Problem Relation (Age of Onset)    Asthma Father    Cerebral Aneurysm Sister    Dementia Mother    Heart Attack Paternal Grandmother (9)          Social History     Tobacco Use    Smoking status: Every Day     Current packs/day: 1.00     Average packs/day: 1 pack/day for 51.2 years (51.2 ttl pk-yrs)     Types: Cigarettes     Start date: 70    Smokeless tobacco: Never    Tobacco comments:     THE PATIENT STARTED    Vaping Use    Vaping status: Never Used   Substance Use Topics    Alcohol use: Not Currently     Alcohol/week: 15.0 standard drinks of alcohol     Types: 15 Cans of beer per week     Comment:  may 17th, 2022     Drug use: Yes     Types: Marijuana     Comment:  a joint or 2 a day        Objective:  ED Triage Vitals [02/26/24 1214]   BP (Non-Invasive) (!) 162/72   Heart Rate 69   Respiratory Rate 17   Temperature 36.4 C (97.6 F)   SpO2 92 %   Weight 101 kg (223 lb)   Height 1.727 m (5\' 8" )     Nursing notes and vital signs reviewed.    Constitutional:  No acute distress.  Alert and  oriented to person, place, and time.   HENT:   Head: Normocephalic and atraumatic.   Mouth/Throat: Oropharynx is clear and moist.   Eyes: EOMI, PERRL   Neck: Trachea midline. Neck supple.  Cardiovascular: RRR, No murmurs, rubs or gallops. Intact distal pulses.  Pulmonary/Chest: BS equal bilaterally. No respiratory distress. No wheezes, rales or chest tenderness.  No tachypnea, retractions or accessory muscle use.  Abdominal: BS +. Abdomen soft, no tenderness,  rebound or guarding.  Back: No midline spinal tenderness, no paraspinal tenderness, no CVA tenderness.           Musculoskeletal: No edema, tenderness or deformity.  Strength 5/5 in all extremities  GU: Blood visualized in foley bag.  Skin: warm and dry. No rash, erythema, pallor or cyanosis  Psychiatric: normal mood and affect. Behavior is normal.   Neurological: Patient keenly alert and responsive, CN II-XII grossly intact, moving all extremities equally and fully  Labs:   Labs Ordered/Reviewed   COMPREHENSIVE METABOLIC PANEL, NON-FASTING - Abnormal; Notable for the following components:       Result Value    CHLORIDE 112 (*)     ALBUMIN/GLOBULIN RATIO 1.3 (*)     All other components within normal limits    Narrative:     Estimated Glomerular Filtration Rate (eGFR) is calculated using the CKD-EPI (2021) equation, intended for patients 40 years of age and older. If gender is not documented or "unknown", there will be no eGFR calculation.   PT/INR - Abnormal; Notable for the following components:    INR 1.12 (*)     All other components within normal limits    Narrative:     Coumadin therapy INR range for Conventional Anticoagulation is 2.0 to 3.0 and for Intensive Anticoagulation 2.5 to 3.5.   CBC WITH DIFF - Abnormal; Notable for the following components:    RBC 3.82 (*)     HGB 12.2 (*)     HCT 35.8 (*)     RDW 17.3 (*)     PLATELETS 88 (*)     All other components within normal limits   PTT (PARTIAL THROMBOPLASTIN TIME) - Normal   CBC/DIFF    Narrative:     The following orders were created for panel order CBC/DIFF.  Procedure                               Abnormality         Status                     ---------                               -----------         ------                     CBC WITH DIFF[[id number]]                Abnormal            Final result                 Please view results for these tests on the individual orders.   URINALYSIS WITH REFLEX MICROSCOPIC AND CULTURE IF POSITIVE    Narrative:      The following orders were created for panel order URINALYSIS WITH REFLEX MICROSCOPIC  AND CULTURE IF POSITIVE.  Procedure                               Abnormality         Status                     ---------                               -----------         ------                     URINALYSIS, MACRO/MICRO[[id number]]                                                       Please view results for these tests on the individual orders.   URINALYSIS, MACRO/MICRO       Imaging:  No orders to display       EKG:     MDM/Course:  Labs show no significant findings.  Care was checked out to Dr.DelZotto at 1605 with irrigating catheter placement and irrigation still pending.  I feel if the catheter is able to be placed in the started irrigation patient will likely need admitted overnight to observe clearing.  If there were any issues Urology at [facility] so they would be happy to discuss with our providers.      ED Course as of 02/26/24 1605   Fri Feb 26, 2024   1600 Labs showed no significant acute abnormalities.  Patient is thrombocytopenic which is not new.  I spoke with his urologist at [facility] who suggest placing a 20 or 22 French irrigating catheter and irrigating the bladder.  He felt that the patient could be observed here overnight and if symptoms improved he could be discharged home to follow up as indicated.       Clinical Impression:     Clinical Impression   Hematuria, unspecified type (Primary)       Medications given:  Medications Ordered/Administered in the ED   NS irrigation (has no administration in time range)           Disposition: Data Unavailable       Current Discharge Medication List        CONTINUE these medications - NO CHANGES were made during your visit.        Details   albuterol sulfate 90 mcg/actuation oral inhaler  Commonly known as: Ventolin HFA   2 Puffs, Inhalation, EVERY 6 HOURS PRN  Qty: 1 Each  Refills: 5     fluticasone propion-salmeteroL 230-21  mcg/actuation oral inhaler  Commonly known as: Advair HFA   2 Puffs, Inhalation, 2 TIMES DAILY, RINSE MOUTH WITH WATER AFTER INHALER USE  Qty: 1 g  Refills: 5     hyoscyamine sulfate 0.125 mg Tablet  Commonly known as: LEVSIN   0.125 mg, Oral, EVERY 4 HOURS PRN  Qty: 20 Tablet  Refills: 0     ipratropium-albuteroL 0.5 mg-3 mg(2.5 mg base)/3 mL nebulizer solution  Commonly known as: DUONEB   3 mL, Nebulization, 3 TIMES DAILY PRN  Qty: 270 mL  Refills: 1  ketorolac tromethamine 10 mg Tablet  Commonly known as: TORADOL   10 mg, Oral, EVERY 6 HOURS PRN  Qty: 15 Tablet  Refills: 0     OLANZapine 10 mg Tablet  Commonly known as: zyPREXA   10 mg, NIGHTLY  Refills: 0     oxygen gas  Commonly known as: o2   4 L/min, CONTINUOUS  Refills: 0     pantoprazole 40 mg Tablet, Delayed Release (E.C.)  Commonly known as: PROTONIX   TAKE 1 TABLET (40 MG TOTAL) BY MOUTH TWICE A DAY 30 MINTUTES BEFORE MEALS FOR 90 DAYS  Qty: 60 Tablet  Refills: 3     rosuvastatin 10 mg Tablet  Commonly known as: CRESTOR   10 mg, Oral, EVERY EVENING  Qty: 90 Tablet  Refills: 1     sulfamethoxazole-trimethoprim 800-160 mg tablet  Commonly known as: BACTRIM DS   160 mg, Oral, EVERY 24 HOURS  Qty: 7 Tablet  Refills: 0     traZODone 50 mg Tablet  Commonly known as: DESYREL   50 mg, NIGHTLY  Refills: 0            ASK your doctor about these medications.        Details   mirabegron 25 mg Tablet Sustained Release 24 hr  Commonly known as: MYRBETRIQ  Ask about: Should I take this medication?   25 mg, Oral, DAILY  Qty: 30 Tablet  Refills: 0     nitrofurantoin monohyd/m-cryst 100 mg Capsule  Commonly known as: Macrobid  Ask about: Should I take this medication?   100 mg, Oral, 2 TIMES DAILY  Qty: 14 Capsule  Refills: 0              Follow up:    No follow-up provider specified.    Parts of this patients chart were completed in a retrospective fashion due to simultaneous direct patient care activities in the Emergency Department.   This note was partially  generated using MModal Fluency Direct system, and there may be some incorrect words, spellings, and punctuation that were not noted in checking the note before saving.      I am scribing for, and in the presence of, Dr. Dareen Piano for services provided on 02/26/2024.  Carola Frost, SCRIBE  on 02/26/2024

## 2024-02-26 NOTE — ED Attending Handoff Note (Signed)
 This patient was evaluated myself and nursing staff I did not independently evaluated this patient the patient was handed off to me did not day team.  Patient was found to have significant hematuria 3 lumen catheter placed in the patient had significant improvement.  However the patient is still had clot.  With the patient's continual clot burden I do believe he would be pertinent for this patient to observe him overnight.  The patient will be admitted to family medicine service for further management secondary to his hematuria.

## 2024-02-27 ENCOUNTER — Encounter (HOSPITAL_COMMUNITY): Payer: Self-pay

## 2024-02-27 ENCOUNTER — Observation Stay (HOSPITAL_COMMUNITY): Payer: MEDICAID

## 2024-02-27 DIAGNOSIS — E785 Hyperlipidemia, unspecified: Secondary | ICD-10-CM

## 2024-02-27 DIAGNOSIS — Z8551 Personal history of malignant neoplasm of bladder: Secondary | ICD-10-CM

## 2024-02-27 DIAGNOSIS — J449 Chronic obstructive pulmonary disease, unspecified: Secondary | ICD-10-CM

## 2024-02-27 DIAGNOSIS — D696 Thrombocytopenia, unspecified: Secondary | ICD-10-CM

## 2024-02-27 DIAGNOSIS — D638 Anemia in other chronic diseases classified elsewhere: Secondary | ICD-10-CM

## 2024-02-27 DIAGNOSIS — I8510 Secondary esophageal varices without bleeding: Secondary | ICD-10-CM

## 2024-02-27 LAB — CBC WITH DIFF
BASOPHIL #: 0.1 10*3/uL (ref 0.00–0.20)
BASOPHIL %: 1 % (ref 0–2)
EOSINOPHIL #: 0.2 10*3/uL (ref 0.00–0.60)
EOSINOPHIL %: 2 % (ref 0–5)
HCT: 28.6 % — ABNORMAL LOW (ref 36.0–46.0)
HGB: 9.8 g/dL — ABNORMAL LOW (ref 13.9–16.3)
LYMPHOCYTE #: 1.9 10*3/uL (ref 1.10–3.80)
LYMPHOCYTE %: 24 % (ref 19–46)
MCH: 32 pg (ref 25.4–34.0)
MCHC: 34.5 g/dL (ref 30.0–37.0)
MCV: 92.7 fL (ref 80.0–100.0)
MONOCYTE #: 0.8 10*3/uL (ref 0.10–0.80)
MONOCYTE %: 10 % (ref 4–12)
MPV: 8.3 fL (ref 7.5–11.5)
NEUTROPHIL #: 4.9 10*3/uL (ref 1.80–7.50)
NEUTROPHIL %: 62 % (ref 41–69)
PLATELETS: 100 10*3/uL — ABNORMAL LOW (ref 130–400)
RBC: 3.08 10*6/uL — ABNORMAL LOW (ref 4.30–5.90)
RDW: 16.9 % — ABNORMAL HIGH (ref 11.5–14.0)
WBC: 7.9 10*3/uL (ref 4.5–11.5)

## 2024-02-27 LAB — BASIC METABOLIC PANEL
ANION GAP: 1 mmol/L — ABNORMAL LOW (ref 5–19)
BUN/CREA RATIO: 13 (ref 6–20)
BUN: 12 mg/dL (ref 9–20)
CALCIUM: 8.3 mg/dL — ABNORMAL LOW (ref 8.4–10.2)
CHLORIDE: 115 mmol/L — ABNORMAL HIGH (ref 98–107)
CO2 TOTAL: 24 mmol/L (ref 22–30)
CREATININE: 0.92 mg/dL (ref 0.66–1.20)
ESTIMATED GFR: >60 mL/min/{1.73_m2} (ref >60)
GLUCOSE: 111 mg/dL — ABNORMAL HIGH (ref 74–106)
POTASSIUM: 4 mmol/L (ref 3.5–5.1)
SODIUM: 140 mmol/L (ref 137–145)

## 2024-02-27 LAB — CBC
HCT: 31.1 % — ABNORMAL LOW (ref 36.0–46.0)
HCT: 27.9 % — ABNORMAL LOW (ref 36.0–46.0)
HCT: 26.7 % — ABNORMAL LOW (ref 36.0–46.0)
HGB: 10.8 g/dL — ABNORMAL LOW (ref 13.9–16.3)
HGB: 9.8 g/dL — ABNORMAL LOW (ref 13.9–16.3)
HGB: 9.1 g/dL — ABNORMAL LOW (ref 13.9–16.3)
MCH: 32.2 pg (ref 25.4–34.0)
MCH: 32.6 pg (ref 25.4–34.0)
MCH: 31.9 pg (ref 25.4–34.0)
MCHC: 34.8 g/dL (ref 30.0–37.0)
MCHC: 35.1 g/dL (ref 30.0–37.0)
MCHC: 34.1 g/dL (ref 30.0–37.0)
MCV: 92.5 fL (ref 80.0–100.0)
MCV: 92.9 fL (ref 80.0–100.0)
MCV: 93.6 fL (ref 80.0–100.0)
MPV: 8.1 fL (ref 7.5–11.5)
MPV: 8.3 fL (ref 7.5–11.5)
MPV: 8 fL (ref 7.5–11.5)
PLATELETS: 106 10*3/uL — ABNORMAL LOW (ref 130–400)
PLATELETS: 89 10*3/uL — ABNORMAL LOW (ref 130–400)
PLATELETS: 83 10*3/uL — ABNORMAL LOW (ref 130–400)
RBC: 3.36 10*6/uL — ABNORMAL LOW (ref 4.30–5.90)
RBC: 3 10*6/uL — ABNORMAL LOW (ref 4.30–5.90)
RBC: 2.86 10*6/uL — ABNORMAL LOW (ref 4.30–5.90)
RDW: 16.8 % — ABNORMAL HIGH (ref 11.5–14.0)
RDW: 17.2 % — ABNORMAL HIGH (ref 11.5–14.0)
RDW: 17.2 % — ABNORMAL HIGH (ref 11.5–14.0)
WBC: 9.1 10*3/uL (ref 4.5–11.5)
WBC: 7.3 10*3/uL (ref 4.5–11.5)
WBC: 6.2 10*3/uL (ref 4.5–11.5)

## 2024-02-27 LAB — PT/INR
INR: 1.11 (ref 0.78–1.11)
PROTHROMBIN TIME: 12.4 s (ref 10.2–12.5)

## 2024-02-27 LAB — TYPE AND SCREEN
ABO/RH(D): A POS
ANTIBODY SCREEN: NEGATIVE

## 2024-02-27 LAB — GOLD TOP TUBE

## 2024-02-27 MED ORDER — SODIUM CHLORIDE 0.9 % IV BOLUS
40.0000 mL | INJECTION | Freq: Once | Status: DC | PRN
Start: 2024-02-27 — End: 2024-02-27

## 2024-02-27 MED ORDER — TRAMADOL 50 MG TABLET
50.0000 mg | ORAL_TABLET | Freq: Four times a day (QID) | ORAL | Status: DC | PRN
Start: 2024-02-27 — End: 2024-02-28
  Administered 2024-02-28: 50 mg via ORAL
  Filled 2024-02-27: qty 1

## 2024-02-27 MED ORDER — HYDROXYZINE HCL 50 MG/ML INTRAMUSCULAR SOLUTION
25.0000 mg | INTRAMUSCULAR | Status: AC
Start: 2024-02-27 — End: 2024-02-27
  Administered 2024-02-27: 25 mg via INTRAMUSCULAR
  Filled 2024-02-27: qty 1

## 2024-02-27 MED ORDER — SODIUM CHLORIDE 0.9 % IV BOLUS
40.0000 mL | INJECTION | Freq: Once | Status: AC | PRN
Start: 2024-02-27 — End: 2024-02-27

## 2024-02-27 MED ORDER — NICOTINE 21 MG/24 HR DAILY TRANSDERMAL PATCH
21.0000 mg | MEDICATED_PATCH | Freq: Every day | TRANSDERMAL | Status: DC
Start: 2024-02-27 — End: 2024-02-28
  Administered 2024-02-27 – 2024-02-28 (×2): 21 mg via TRANSDERMAL
  Filled 2024-02-27 (×2): qty 1

## 2024-02-27 MED ORDER — PANTOPRAZOLE 40 MG INTRAVENOUS SOLUTION
40.0000 mg | Freq: Every day | INTRAVENOUS | Status: DC
Start: 2024-02-27 — End: 2024-02-28
  Administered 2024-02-27 – 2024-02-28 (×2): 40 mg via INTRAVENOUS
  Filled 2024-02-27 (×2): qty 10

## 2024-02-27 NOTE — Care Plan (Signed)
 Medical Nutrition Therapy Assessment        SUBJECTIVE : Pt is a 63 year old male who presented with hemauria. Per provider note, pt had mass removed from bladder on 3/12. PMH significant for, but not limited to, COPD, ETOH abuse, HTN.  Upon entering room, pt was covered in blanket head to foot. He is not attempt to remove blanket so could not visualize pt. When ask about appetite/intake, he states "considering by NPO, I'd say my appetite is pretty good". Pt chose not to answer further questions.     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)    Alcohol abuse    Tobacco use disorder    Chronic obstructive pulmonary disease (CMS HCC)    Mental disorder    Dyslipidemia    Annual physical exam    Carotid stenosis, right    Coronary artery calcification seen on CT scan    Carotid stenosis, asymptomatic, right    Urinary frequency    Hematuria    COPD exacerbation (CMS HCC)    AKI (acute kidney injury) (CMS HCC)    Inability to perform activities of daily living    Acute cystitis without hematuria    Bladder tumor    Chronic hypoxemic respiratory failure (CMS HCC)    Pulmonary HTN (CMS HCC)     Past Medical History:   Diagnosis Date    Alcohol abuse     Carotid stenosis     right side stent    Chronic obstructive airway disease (CMS HCC)     Cirrhosis (CMS HCC)     Closed fracture of transverse process of thoracic vertebra (CMS HCC) 05/01/2021    Coma (CMS HCC)     after fall due to alcholism May 2022    Delirium, withdrawal, alcoholic (CMS HCC)     Depression     Encephalopathy 05/15/2021    Esophageal reflux     H/O urinary tract infection     1/25    HTN (hypertension)     Hyperlipidemia     Ileus (CMS HCC) 05/18/2021    Left pulmonary contusion 05/01/2021    Leukopenia 05/18/2021    Multiple rib fractures 05/01/2021    Oxygen dependent     Pleural effusion 05/11/2021    Serum ammonia increased (CMS HCC) 05/15/2021    Shortness of breath     Spleen injury  05/01/2021    Status post thoracentesis 05/19/2021    Thrombocytopenia (CMS HCC) 05/01/2021    Unknown cause of injury     fx nose    Wears glasses        Current Diet Order/Nutrition Support:  DIET NPO - NOW STRICT     Nutrition (last 7 days)       Date/Time Weight Diet/Nutrition Received Food/meal Intake (%) Diet/Feeding Assistance Diet/Feeding Tolerance Nutritional Supplements Type of Supplement Supplement Volume Who    02/27/24 0316 -- NPO -- -- -- -- -- -- -- [id number]    02/26/24 1824 101 kg (223 lb) -- -- -- -- -- -- -- -- [id number]    02/26/24 1214 101 kg (223 lb) -- -- -- -- -- -- -- -- 220254            Height Used for Calculations: 172.7 cm (5' 7.99")  Weight Used For Calculations: 101 kg (222 lb 10.6 oz)  BMI (kg/m2): 33.93  BMI Assessment: BMI 30-34.9: obesity grade I  Ideal Body Weight (IBW) (kg): 70.87  % Ideal Body Weight: 142.51     Wt Readings from Last 10 Encounters:   02/26/24 101 kg (223 lb)   02/24/24 102 kg (223 lb 15.8 oz)   02/01/24 105 kg (230 lb 11.2 oz)   01/28/24 110 kg (242 lb)   01/16/24 103 kg (227 lb 11.8 oz)   01/05/24 111 kg (244 lb 14.9 oz)   10/29/23 112 kg (245 lb 13 oz)   10/28/23 111 kg (244 lb)   10/20/23 110 kg (243 lb)   07/29/23 113 kg (249 lb)     Significant wt loss 19#/7.8% x 1 month      Physical Assessment: No pressure injuries reported per LDAW  Wound   Right;Medial Thigh (Active)   Initial Date of Wound Assessment/Initial Time of Wound Assessment: 01/16/24 0059   Present on Original Admission: Yes  Primary Wound Type: (c)   Orientation: Right;Medial  Location: Thigh      Assessments 01/19/2024 11:00 PM   Dressing Status Clean, Dry and Intact   DRESSING TYPE Border Foam       No associated orders.         Comments:  Pertinent Medications: Protonix  Pertinent labs: BMP (Last 48 Hours):    Recent Results in last 48 hours     02/26/24  1410 02/27/24  0412   SODIUM 140 140   POTASSIUM 3.6 4.0   CHLORIDE 112* 115*   CO2 22 24   BUN 14 12   CREATININE 0.97 0.92   CALCIUM 8.9 8.3*    GLUCOSENF 98 111*       Lab Results   Component Value Date    HA1C 5.3 05/02/2021      Lab Results   Component Value Date    TRIG 74 04/13/2023    HDLCHOL 32 (L) 04/13/2023    LDLCHOL 114 (H) 04/13/2023    CHOLESTEROL 161 04/13/2023           Plan/Interventions :   Diet per physician, NPO at this time. Monitor for diet advancement. Monitor nutrition status.     *unable to determine at this time if pt is malnourished due to inadequate evidence to meet criteria. Pt is noted with significant wt loss x 1 month, anticipate (but not able to confirm) decreased oral intake. Pt currently meeting 1 of 2 criteria for meet ASPEN guidelines for malnutrition.      Trey Paula, RDLD               Problem: Malnutrition  Goal: Improved Nutritional Intake  Outcome: Ongoing (see interventions/notes)     Problem: Oral Intake Inadequate  Goal: Improved Oral Intake  Outcome: Ongoing (see interventions/notes)

## 2024-02-27 NOTE — Care Plan (Signed)
 Problem: Wound  Goal: Optimal Coping  Outcome: Ongoing (see interventions/notes)  Intervention: Support Patient and Family Response  Recent Flowsheet Documentation  Taken 02/26/2024 2100 by Thana Ates, RN  Family/Support System Care: self-care encouraged  Goal: Optimal Functional Ability  Outcome: Ongoing (see interventions/notes)  Intervention: Optimize Functional Ability  Recent Flowsheet Documentation  Taken 02/26/2024 2100 by Thana Ates, RN  Activity Management: bedrest  Activity Assistance Provided: assistance, 1 person  Goal: Absence of Infection Signs and Symptoms  Outcome: Ongoing (see interventions/notes)  Intervention: Prevent or Manage Infection  Recent Flowsheet Documentation  Taken 02/26/2024 2100 by Taralyn Ferraiolo L, RN  Fever Reduction/Comfort Measures:   lightweight bedding   lightweight clothing  Goal: Improved Oral Intake  Outcome: Ongoing (see interventions/notes)  Goal: Optimal Pain Control and Function  Outcome: Ongoing (see interventions/notes)  Intervention: Prevent or Manage Pain  Recent Flowsheet Documentation  Taken 02/26/2024 2100 by Thana Ates, RN  Sleep/Rest Enhancement:   awakenings minimized   relaxation techniques promoted   regular sleep/rest pattern promoted   room darkened  Goal: Skin Health and Integrity  Outcome: Ongoing (see interventions/notes)  Intervention: Optimize Skin Protection  Recent Flowsheet Documentation  Taken 02/26/2024 2100 by Rejeana Brock L, RN  Pressure Reduction Techniques: Heels elevated off of the bed  Pressure Reduction Devices: Repositioning wedges/pillows utilized  Activity Management: bedrest  Head of Bed (HOB) Positioning: HOB elevated  Goal: Optimal Wound Healing  Outcome: Ongoing (see interventions/notes)  Intervention: Promote Wound Healing  Recent Flowsheet Documentation  Taken 02/26/2024 2100 by Coalton Arch L, RN  Pressure Reduction Techniques: Heels elevated off of the bed  Pressure Reduction Devices: Repositioning wedges/pillows utilized  Sleep/Rest Enhancement:    awakenings minimized   relaxation techniques promoted   regular sleep/rest pattern promoted   room darkened  Activity Management: bedrest     Problem: Adult Inpatient Plan of Care  Goal: Plan of Care Review  Outcome: Ongoing (see interventions/notes)  Goal: Patient-Specific Goal (Individualized)  Outcome: Ongoing (see interventions/notes)  Flowsheets (Taken 02/26/2024 2100)  Individualized Care Needs: help wth ADLs  Anxieties, Fears or Concerns: denies  Goal: Absence of Hospital-Acquired Illness or Injury  Outcome: Ongoing (see interventions/notes)  Intervention: Identify and Manage Fall Risk  Recent Flowsheet Documentation  Taken 02/26/2024 2100 by Thana Ates, RN  Safety Promotion/Fall Prevention:   nonskid shoes/slippers when out of bed   safety round/check completed  Intervention: Prevent Skin Injury  Recent Flowsheet Documentation  Taken 02/26/2024 2100 by Thana Ates, RN  Body Position: supine, head elevated  Skin Protection:   adhesive use limited   protective footwear used  Intervention: Prevent and Manage VTE (Venous Thromboembolism) Risk  Recent Flowsheet Documentation  Taken 02/26/2024 2100 by Thana Ates, RN  VTE Prevention/Management:   ambulation promoted   bleeding risk factor(s) identified, physician notified  Intervention: Prevent Infection  Recent Flowsheet Documentation  Taken 02/26/2024 2100 by Thana Ates, RN  Infection Prevention:   promote handwashing   rest/sleep promoted  Goal: Optimal Comfort and Wellbeing  Outcome: Ongoing (see interventions/notes)  Intervention: Provide Person-Centered Care  Recent Flowsheet Documentation  Taken 02/26/2024 2100 by Thana Ates, RN  Trust Relationship/Rapport:   care explained   choices provided   questions encouraged   reassurance provided  Goal: Rounds/Family Conference  Outcome: Ongoing (see interventions/notes)     Problem: Activity Intolerance  Goal: Enhanced Capacity and Energy  Outcome: Ongoing (see interventions/notes)  Intervention: Optimize Activity  Tolerance  Recent Flowsheet Documentation  Taken 02/26/2024 2100 by Thana Ates, RN  Activity Management: bedrest  Problem: Pain Acute  Goal: Optimal Pain Control and Function  Outcome: Ongoing (see interventions/notes)  Intervention: Optimize Psychosocial Wellbeing  Recent Flowsheet Documentation  Taken 02/26/2024 2100 by Thana Ates, RN  Diversional Activities: television  Intervention: Prevent or Manage Pain  Recent Flowsheet Documentation  Taken 02/26/2024 2100 by Thana Ates, RN  Sleep/Rest Enhancement:   awakenings minimized   relaxation techniques promoted   regular sleep/rest pattern promoted   room darkened  Medication Review/Management: medications reviewed

## 2024-02-27 NOTE — Discharge Summary (Incomplete)
 [facility]  DISCHARGE SUMMARY    PATIENT NAME:  Hector [name], Hector [name]  MRN:  [id number]  DOB:  11-17-1961    ENCOUNTER DATE:  02/26/2024  INPATIENT ADMISSION DATE:   DISCHARGE DATE:  02/27/2024    ATTENDING PHYSICIAN: Terance Ice, DO  SERVICE: [facility] FAMILY MEDICINE 2  PRIMARY CARE PHYSICIAN: Robin Searing, MD       No lay caregiver identified.    PRIMARY DISCHARGE DIAGNOSIS: Hematuria  Active Hospital Problems    Diagnosis Date Noted    Principal Problem: Hematuria [R31.9] 10/20/2023    History of bladder cancer [Z85.51] 02/27/2024    Chronic respiratory failure with hypoxia (CMS [facility]) [J96.11] 01/18/2024      Resolved Hospital Problems   No resolved problems to display.     Active Non-Hospital Problems    Diagnosis Date Noted    Pulmonary HTN (CMS HCC) 02/11/2024    Acute cystitis without hematuria 01/16/2024    Bladder tumor 01/16/2024    Inability to perform activities of daily living 01/15/2024    AKI (acute kidney injury) (CMS HCC) 01/06/2024    COPD exacerbation (CMS HCC) 01/05/2024    Urinary frequency 10/20/2023    Carotid stenosis, asymptomatic, right 06/29/2023    Carotid stenosis, right 06/03/2023    Coronary artery calcification seen on CT scan 06/03/2023    Mental disorder 09/25/2021    Dyslipidemia 09/25/2021    Annual physical exam 09/25/2021    Chronic obstructive pulmonary disease (CMS HCC) 06/18/2021    Tobacco use disorder 05/29/2021    Cirrhosis of liver without ascites (CMS HCC) 05/01/2021    Alcohol abuse 05/01/2021             Current Discharge Medication List        CONTINUE these medications - NO CHANGES were made during your visit.        Details   albuterol sulfate 90 mcg/actuation oral inhaler  Commonly known as: Ventolin HFA   2 Puffs, Inhalation, EVERY 6 HOURS PRN  Qty: 1 Each  Refills: 5     fluticasone propion-salmeteroL 230-21 mcg/actuation oral inhaler  Commonly known as: Advair HFA   2 Puffs, Inhalation, 2 TIMES DAILY, RINSE MOUTH WITH WATER AFTER INHALER USE  Qty: 1 g  Refills:  5     hyoscyamine sulfate 0.125 mg Tablet  Commonly known as: LEVSIN   0.125 mg, Oral, EVERY 4 HOURS PRN  Qty: 20 Tablet  Refills: 0     ipratropium-albuteroL 0.5 mg-3 mg(2.5 mg base)/3 mL nebulizer solution  Commonly known as: DUONEB   3 mL, Nebulization, 3 TIMES DAILY PRN  Qty: 270 mL  Refills: 1     ketorolac tromethamine 10 mg Tablet  Commonly known as: TORADOL   10 mg, Oral, EVERY 6 HOURS PRN  Qty: 15 Tablet  Refills: 0     OLANZapine 10 mg Tablet  Commonly known as: zyPREXA   10 mg, NIGHTLY  Refills: 0     oxygen gas  Commonly known as: o2   4 L/min, CONTINUOUS  Refills: 0     pantoprazole 40 mg Tablet, Delayed Release (E.C.)  Commonly known as: PROTONIX   TAKE 1 TABLET (40 MG TOTAL) BY MOUTH TWICE A DAY 30 MINTUTES BEFORE MEALS FOR 90 DAYS  Qty: 60 Tablet  Refills: 3     rosuvastatin 10 mg Tablet  Commonly known as: CRESTOR   10 mg, Oral, EVERY EVENING  Qty: 90 Tablet  Refills: 1  sulfamethoxazole-trimethoprim 800-160 mg tablet  Commonly known as: BACTRIM DS   160 mg, Oral, EVERY 24 HOURS  Qty: 7 Tablet  Refills: 0     traZODone 50 mg Tablet  Commonly known as: DESYREL   50 mg, NIGHTLY  Refills: 0            ASK your doctor about these medications.        Details   mirabegron 25 mg Tablet Sustained Release 24 hr  Commonly known as: MYRBETRIQ  Ask about: Should I take this medication?   25 mg, Oral, DAILY  Qty: 30 Tablet  Refills: 0     nitrofurantoin monohyd/m-cryst 100 mg Capsule  Commonly known as: Macrobid  Ask about: Should I take this medication?   100 mg, Oral, 2 TIMES DAILY  Qty: 14 Capsule  Refills: 0            Discharge med list refreshed?  YES     Allergies   Allergen Reactions    Bee Venom Protein (Honey Bee)      Bee stings     HOSPITAL PROCEDURE(S):   No orders of the defined types were placed in this encounter.      REASON FOR HOSPITALIZATION AND HOSPITAL COURSE   BRIEF HPI:  Hector [name] is a 63 y.o. male with a PMH of suspected bladder malignancy s/p TURBT, COPD on 3L NC at baseline,  HTN, HLD, liver cirrhosis with chronic thrombocytopenia and anemia 2/2 alcohol use disorder in remission,  current smoker admitted for painful hematuria following TURBT procedure and foley placement on 03/12. Patient reports since TURBT he has had persistent hematuria with clots. He has been struggling at home with clots and came to the ER on 03/13 and had manual irrigation to clear the clots. The pain got increasingly worse and he continued to struggle to pass clots so he came to the ER for evaluation. He discontinued his Plavix and aspirin in February d/t continuous hematuria.     BRIEF HOSPITAL NARRATIVE:     Patient was admitted for gross hematuria post TURBT 3/12. In the setting of high grade T1 bladder cancer. Urology was consulted and recommended transfter to tertiary centere. Patient was started on CBI.  Serial CBC was done and hemoglobin initially dropping but has stabilized at the time of transfer. Type and cross was done and blood transfusion is pending if needed to be ordered. [facility] was consulted and patient will bet transferred in stable condition.     TRANSITION/POST DISCHARGE CARE/PENDING TESTS/REFERRALS:     CONDITION ON DISCHARGE:  A. Ambulation: Full ambulation  B. Self-care Ability: With partial assistance  C. Cognitive Status Alert and Oriented x 3  D. Code status at discharge:       LINES/DRAINS/WOUNDS AT DISCHARGE:   Patient Lines/Drains/Airways Status       Active Line / Dialysis Catheter / Dialysis Graft / Drain / Airway / Wound       Name Placement date Placement time Site Days    Peripheral IV Right Median Cubital  (antecubital fossa) 02/26/24  1410  -- less than 1    Foley Catheter 02/26/24  1850  -- less than 1    Wound   Right;Medial Thigh 01/16/24  0059  -- 42                    DISCHARGE DISPOSITION:  Transfer to tertiary care      DISCHARGE INSTRUCTIONS:  Post-Discharge Follow Up Appointments  Wednesday Mar 09, 2024    Return Telephone Visit with Isaias Sakai, APRN,AGPCNP-BC at  11:30 AM      Monday Mar 14, 2024    Return Patient Visit with Consuelo Pandy, PA-C at  1:40 PM      Tuesday Apr 26, 2024    Return Patient Visit with Leim Fabry at  1:00 PM      Wednesday Apr 27, 2024    Return Patient Visit with Robin Searing, MD at 10:00 AM      Friday Feb 03, 2025    Ultrasound with [facility] at  9:00 AM    Return Patient Visit with Gerilyn Nestle, PA-C at 10:00 AM      Gastroenterology, [facility] 3  [facility], [place]  [address] [place] [address]  [phone] Imaging Services, [facility]  [facility] 1, [facility]  [address]  [place] [address]  [phone] Internal Medicine, [facility] [place]  [address]  [place] [address]  [phone]    Pulmonology, Satira Sark [facility] [address] [place]  [address]  [place] [address]  [phone] Urology Oncology, Windy Kalata [facility]  [facility], [facility]  [address] [facility]  [place] [address]  [phone] Vascular Surgery, [facility] 1  [facility], [facility]  [address]  [place] [address]  [phone]          No discharge procedures on file.       Andres Labrum, MD    Copies sent to Care Team         Relationship Specialty Notifications Start End    Robin Searing, MD PCP - General INTERNAL MEDICINE Admissions 07/22/21     Phone: [phone] Fax: [phone]         [address] [place] [address]    Leim Fabry  FAMILY NURSE PRACTITIONER Admissions 06/22/23     Phone: [phone] Fax: [phone]         [phone] [place] [facility] [address]            Referring providers can utilize [email or web address] to access their referred [facility] patient's information.

## 2024-02-27 NOTE — Progress Notes (Signed)
 [facility]  Department of Family Medicine  Inpatient Progress Note      Name: Hector [name]  Age: 63 y.o.   Gender: male  MRN: [id number]  Date of Admission:  02/26/2024  PCP: Robin Searing, MD  Attending: Terance Ice, DO  Consultants:   Consult Orders Placed This Encounter   Procedures    IP CONSULT TO UROLOGY SEMINS, Lisa Roca       SUBJECTIVE:      Hospital day 0    Hector [name] seen and evaluated at bedside. No acute events reported. Hector [name] voiced frustration this morning due to ongoing hematuria and attempted to leave AMA. Hector [name] denies other acute complaints. Hector [name] has been accepted to [facility] and currently on high priority list. Placed NPO pending anticipated transfer. CBI open and draining pinkish fluids. Urology is following.     Called [facility] to get an update at around 1800. Was informed that it may take more or less 24 hours to proceed with transfer as [facility] is still on diversion.      Review of Systems:  As above    OBJECTIVE     BP (!) 132/54   Pulse 78   Temp 36.9 C (98.4 F)   Resp 18   Ht 1.727 m (5\' 8" )   Wt 101 kg (223 lb)   SpO2 94%   BMI 33.91 kg/m     PHYSICAL EXAM  Physical Exam  Constitutional:       General: Hector [name] is not in acute distress.     Appearance: Normal appearance.   HENT:      Head: Normocephalic and atraumatic.   Eyes:      Extraocular Movements: Extraocular movements intact.      Conjunctiva/sclera: Conjunctivae normal.      Pupils: Pupils are equal, round, and reactive to light.   Cardiovascular:      Rate and Rhythm: Normal rate and regular rhythm.   Pulmonary:      Effort: Pulmonary effort is normal. No respiratory distress.      Breath sounds: Normal breath sounds.   Abdominal:      General: Abdomen is flat.      Palpations: Abdomen is soft.      Tenderness: There is no abdominal tenderness. There is no guarding.   Genitourinary:     Comments: Foley catheter, CBI in place draining pinkish fluid  Skin:     General: Skin is warm and dry.   Neurological:       General: No focal deficit present.      Mental Status: Hector [name] is alert and oriented to person, place, and time.   Psychiatric:         Behavior: Behavior normal.         Thought Content: Thought content normal.          Medications:    budesonide (PULMICORT RESPULES) 0.5 mg/2 mL nebulizer suspension, 0.5 mg, 2x/day **AND** arformoterol (BROVANA) 15 mcg/2 mL nebulizer solution, 15 mcg, 2x/day    [Held by provider] mirabegron (MYRBETRIQ) 24 hr extended release tablet, 25 mg, Daily    nicotine (NICODERM CQ) transdermal patch (mg/24 hr), 21 mg, Daily    NS flush syringe, 10 mL, Q8HRS    OLANZapine (zyPREXA) tablet, 10 mg, NIGHTLY    pantoprazole (PROTONIX) 4 mg/mL injection, 40 mg, Daily    [Held by provider] pantoprazole (PROTONIX) delayed release tablet, 40 mg, Daily    rosuvastatin (CRESTOR) tablet, 10 mg, QPM  traZODone (DESYREL) tablet, 50 mg, NIGHTLY    trimethoprim-sulfamethoxazole (BACTRIM DS) 160-800mg  per tablet, 1 Tablet, Q24H    Labs:  CBC:     7.3 (03/15 1536) \   9.8* (03/15 1536) /   89* (03/15 1536)      / 27.9* (03/15 1536) \          BMP:   140 (03/15 0412) 115* (03/15 5409) 12 (03/15 8119)    /     111* (03/15 1478)   4.0 (03/15 2956) 24 (03/15 0412) 0.92 (03/15 0412) \               In's and Out's:    Intake/Output Summary (Last 24 hours) at 02/27/2024 1848  Last data filed at 02/27/2024 0453  Gross per 24 hour   Intake --   Output -5700 ml   Net 5700 ml       Radiology Results:  No results found.    Microbiology:  No results found for any visits on 02/26/24 (from the past 96 hours).    ASSESSMENT     Sarah Zerby is a 63 y.o. year-old male admitted for  Active Hospital Problems    Diagnosis    Primary Problem: Hematuria    History of bladder cancer    Chronic respiratory failure with hypoxia (CMS HCC)       PLAN       #Gross hematuria post TURBT 3/12 in the setting of High-grade T1 bladder cancer   #Chronic thrombocytopenia in the setting of cirrhosis 2/2   #Anemia of chronic disease   #History of  alcohol abuse with cirrhosis  - Hgb 9.8 < 10.9 < 9.8 < 11.2  - Iron studies 1/24 : Ferritin 24, Iron 47, Iron sat 15, TIBC 320   - Type & Screen/ Crossmatch ordered : Holding transfusion for now  - Transfusion goal 8  - CT  abdomen pelvis  Cirrhotic liver w/ splenomegaly and esophagogastric varices (portal hypertension).  Small left effusion with suspected pneumonia in the left lower lobe.  Foley catheter within a decompressed bladder that also contains some air and some dense material surrounds the Foley catheter which could be blood.  - Continue CBI   - Myrbetriq 25mg  po qd  - Tramadol 50mg  q 6 hour prn for pain  - Urology consulted, Dr. Idamae Lusher:              Recommend transfer to tertiary center   Serial CBCs to maintain adequate hemoglobin   No anticoagulants  - Transfer confirmed to [facility], currently on high priority list      COPD not in exacerbation and cigarette use   - Currently on 3L O2 via NC (at baseline)  - Continue home albuterol  - Continue budensonide/ arformoterol nebs (formulary for home advair)  - Nicoderm 21mg  po daily    Atherosclerosis of common carotid artery  - Hector [name] with HLD and carotid artery disease s/p stenting. Unfortunately Hector [name] does not know detail of carotid stenting including timing, and nothing available in transfer paperwork or in CareEverywhere at time of admission.   - Continue rosuvastatin 10mg  po daily      GERD  - Continue Protonix     Anxiety and Depression   - Continue home trazodone 50 mg nightly   - Continue home Zyprexa 10 mg nightly            Diet: DIET NPO - NOW  DVT/PE: SCDs  Code Status: SELECTED INTERVENTIONS  Disposition: Home when medically stable    Andres Labrum, MD   02/27/2024, 18:48   Family Medicine Resident, PGY-1  [facility]    I saw and examined the Hector [name] with the resident. I reviewed the resident's note. I agree with the physical exam findings and plan of care as documented in the resident's note. Any exceptions/additions  are edited/noted.   Terance Ice, DO

## 2024-02-27 NOTE — Care Plan (Signed)
 Problem: Wound  Goal: Optimal Coping  Outcome: Ongoing (see interventions/notes)  Goal: Optimal Functional Ability  Outcome: Ongoing (see interventions/notes)  Goal: Absence of Infection Signs and Symptoms  Outcome: Ongoing (see interventions/notes)  Goal: Improved Oral Intake  Outcome: Ongoing (see interventions/notes)  Goal: Optimal Pain Control and Function  Outcome: Ongoing (see interventions/notes)  Goal: Skin Health and Integrity  Outcome: Ongoing (see interventions/notes)  Intervention: Optimize Skin Protection  Recent Flowsheet Documentation  Taken 02/27/2024 0938 by Werner Lean  Pressure Reduction Techniques: Frequent weight shifting encouraged  Pressure Reduction Devices: Repositioning wedges/pillows utilized  Goal: Optimal Wound Healing  Outcome: Ongoing (see interventions/notes)  Intervention: Promote Wound Healing  Recent Flowsheet Documentation  Taken 02/27/2024 0938 by Werner Lean  Pressure Reduction Techniques: Frequent weight shifting encouraged  Pressure Reduction Devices: Repositioning wedges/pillows utilized     Problem: Adult Inpatient Plan of Care  Goal: Plan of Care Review  Outcome: Ongoing (see interventions/notes)  Goal: Patient-Specific Goal (Individualized)  Outcome: Ongoing (see interventions/notes)  Goal: Absence of Hospital-Acquired Illness or Injury  Outcome: Ongoing (see interventions/notes)  Intervention: Identify and Manage Fall Risk  Recent Flowsheet Documentation  Taken 02/27/2024 0938 by Werner Lean  Safety Promotion/Fall Prevention:   activity supervised   fall prevention program maintained  Intervention: Prevent Skin Injury  Recent Flowsheet Documentation  Taken 02/27/2024 2956 by Werner Lean  Skin Protection: adhesive use limited  Intervention: Prevent and Manage VTE (Venous Thromboembolism) Risk  Recent Flowsheet Documentation  Taken 02/27/2024 0800 by Werner Lean  VTE Prevention/Management: sequential compression devices on  Intervention: Prevent Infection  Recent Flowsheet  Documentation  Taken 02/27/2024 2130 by Werner Lean  Infection Prevention:   rest/sleep promoted   promote handwashing  Goal: Optimal Comfort and Wellbeing  Outcome: Ongoing (see interventions/notes)  Goal: Rounds/Family Conference  Outcome: Ongoing (see interventions/notes)     Problem: Activity Intolerance  Goal: Enhanced Capacity and Energy  Outcome: Ongoing (see interventions/notes)     Problem: Pain Acute  Goal: Optimal Pain Control and Function  Outcome: Ongoing (see interventions/notes)     Problem: Malnutrition  Goal: Improved Nutritional Intake  Outcome: Ongoing (see interventions/notes)     Problem: Oral Intake Inadequate  Goal: Improved Oral Intake  Outcome: Ongoing (see interventions/notes)

## 2024-02-27 NOTE — Care Plan (Signed)
 Problem: Wound  Goal: Optimal Coping  Outcome: Ongoing (see interventions/notes)  Intervention: Support Patient and Family Response  Recent Flowsheet Documentation  Taken 02/27/2024 2000 by Thana Ates, RN  Family/Support System Care: self-care encouraged  Goal: Optimal Functional Ability  Outcome: Ongoing (see interventions/notes)  Intervention: Optimize Functional Ability  Recent Flowsheet Documentation  Taken 02/27/2024 2000 by Thana Ates, RN  Activity Management: bedrest  Activity Assistance Provided: assistance, 1 person  Goal: Absence of Infection Signs and Symptoms  Outcome: Ongoing (see interventions/notes)  Intervention: Prevent or Manage Infection  Recent Flowsheet Documentation  Taken 02/27/2024 2000 by Thana Ates, RN  Fever Reduction/Comfort Measures:   lightweight bedding   lightweight clothing  Goal: Improved Oral Intake  Outcome: Ongoing (see interventions/notes)  Goal: Optimal Pain Control and Function  Outcome: Ongoing (see interventions/notes)  Intervention: Prevent or Manage Pain  Recent Flowsheet Documentation  Taken 02/27/2024 2000 by Thana Ates, RN  Sleep/Rest Enhancement: awakenings minimized  Goal: Skin Health and Integrity  Outcome: Ongoing (see interventions/notes)  Intervention: Optimize Skin Protection  Recent Flowsheet Documentation  Taken 02/27/2024 2000 by Thana Ates, RN  Pressure Reduction Techniques: Heels elevated off of the bed  Pressure Reduction Devices: Repositioning wedges/pillows utilized  Activity Management: bedrest  Head of Bed (HOB) Positioning: HOB elevated  Goal: Optimal Wound Healing  Outcome: Ongoing (see interventions/notes)  Intervention: Promote Wound Healing  Recent Flowsheet Documentation  Taken 02/27/2024 2000 by Rejeana Brock L, RN  Pressure Reduction Techniques: Heels elevated off of the bed  Pressure Reduction Devices: Repositioning wedges/pillows utilized  Sleep/Rest Enhancement: awakenings minimized  Activity Management: bedrest     Problem: Adult Inpatient Plan of  Care  Goal: Plan of Care Review  Outcome: Ongoing (see interventions/notes)  Goal: Patient-Specific Goal (Individualized)  Outcome: Ongoing (see interventions/notes)  Flowsheets (Taken 02/27/2024 2000)  Individualized Care Needs: help with ADLs  Anxieties, Fears or Concerns: denies  Goal: Absence of Hospital-Acquired Illness or Injury  Outcome: Ongoing (see interventions/notes)  Intervention: Identify and Manage Fall Risk  Recent Flowsheet Documentation  Taken 02/27/2024 2000 by Thana Ates, RN  Safety Promotion/Fall Prevention:   nonskid shoes/slippers when out of bed   safety round/check completed  Intervention: Prevent Skin Injury  Recent Flowsheet Documentation  Taken 02/27/2024 2000 by Thana Ates, RN  Body Position: supine, head elevated  Skin Protection: adhesive use limited  Intervention: Prevent and Manage VTE (Venous Thromboembolism) Risk  Recent Flowsheet Documentation  Taken 02/27/2024 2000 by Thana Ates, RN  VTE Prevention/Management:   ambulation promoted   bleeding risk factor(s) identified, physician notified  Intervention: Prevent Infection  Recent Flowsheet Documentation  Taken 02/27/2024 2000 by Thana Ates, RN  Infection Prevention:   promote handwashing   rest/sleep promoted  Goal: Optimal Comfort and Wellbeing  Outcome: Ongoing (see interventions/notes)  Intervention: Provide Person-Centered Care  Recent Flowsheet Documentation  Taken 02/27/2024 2000 by Thana Ates, RN  Trust Relationship/Rapport:   care explained   choices provided   questions encouraged   reassurance provided  Goal: Rounds/Family Conference  Outcome: Ongoing (see interventions/notes)     Problem: Activity Intolerance  Goal: Enhanced Capacity and Energy  Outcome: Ongoing (see interventions/notes)  Intervention: Optimize Activity Tolerance  Recent Flowsheet Documentation  Taken 02/27/2024 2000 by Thana Ates, RN  Activity Management: bedrest     Problem: Pain Acute  Goal: Optimal Pain Control and Function  Outcome: Ongoing (see  interventions/notes)  Intervention: Optimize Psychosocial Wellbeing  Recent Flowsheet Documentation  Taken 02/27/2024 2000 by Thana Ates, RN  Diversional Activities: television  Intervention: Prevent or Manage Pain  Recent Flowsheet Documentation  Taken 02/27/2024 2000 by Thana Ates, RN  Sleep/Rest Enhancement: awakenings minimized     Problem: Malnutrition  Goal: Improved Nutritional Intake  Outcome: Ongoing (see interventions/notes)     Problem: Oral Intake Inadequate  Goal: Improved Oral Intake  Outcome: Ongoing (see interventions/notes)

## 2024-02-27 NOTE — Consults (Signed)
 [facility]      Name: Hector [name] MRN:  [id number]   Date: 02/26/2024 Age: 63 y.o.        HISTORY OF PRESENT ILLNESS:  This 63 year old has a several month history of hematuria.  He underwent transurethral resection bladder tumor at [facility] 2 days ago.  He was sent home the same day as the procedure.  He had hematuria yesterday and was irrigated and sent home from the emergency room.  He has bleeding yesterday and returned to ER.  He denies fever.    Dr. Karie Schwalbe saw spoke to the emergency room yesterday and advise transfer.  He was admitted and Dr. Karie Schwalbe saw him yesterday and advise transfer.  I spoke to the hospitalist today and I said that he is being transferred within the next 4-6 hours.    He currently has a catheter in and is on continuous bladder irrigation wide open.    CT urogram in January showed a soft tissue fullness 1 point cm in size at the level of the right ureterovesical junction.  There was no hydronephrosis.    Pathology showed high-grade T1 bladder cancer.    MEDS:  Albuterol   Toradol   Myrbetriq   Macrobid   Zyprexa   Crestor   Trazodone   Protonix  Patient has not been on Plavix since his last admission    ALLERGIES:  None    SOCIAL HX:  Patient smokes    FAMILY HX:  Unremarkable    PAST SURGICAL HX:  Carotid stent  transurethral resection bladder tumor    PAST MEDICAL HX:  High-grade T1 bladder cancer   Alcohol abuse   Hypertension   Elevated cholesterol   Thrombocytopenia  History of cirrhosis  Chronic obstructive pulmonary disease with cigarette use    PHYSICAL EXAM:  White male lying in bed  Temperature 98.2 pulse 73 blood pressure 151/72      LABS:  White count 7.6   Hemoglobin 12.2   Platelets 88000   Creatinine 0.9   CT scan January of 2025 showed normal kidneys  CT urogram in January showed fullness at the right ureterovesical junction      IMPRESSIONS:  Next  High-grade T1 bladder cancer   Chronic obstructive pulmonary disease and cigarette use   Gross hematuria post  TURBT  Thrombocytopenia   History of alcohol abuse with cirrhosis        PLAN:  Urine is light pink to clear with CBI open  Anticipating transfer in the next couple of hours to where he had his surgery  If patient needs additional nursing or ICU to do this, would consider it.  Consider CT to see if clots in the bladder  Serial CBCs to maintain adequate hemoglobin  NPO  No anticoagulants

## 2024-02-28 ENCOUNTER — Observation Stay
Admission: AD | Admit: 2024-02-28 | Discharge: 2024-02-29 | DRG: 696 | Disposition: A | Discharge status: Home / Self Care | Payer: MEDICAID | Source: Other Acute Inpatient Hospital | Attending: Student in an Organized Health Care Education/Training Program | Admitting: Student in an Organized Health Care Education/Training Program

## 2024-02-28 ENCOUNTER — Encounter (HOSPITAL_COMMUNITY): Payer: Self-pay | Admitting: Urology

## 2024-02-28 DIAGNOSIS — J449 Chronic obstructive pulmonary disease, unspecified: Secondary | ICD-10-CM | POA: Diagnosis present

## 2024-02-28 DIAGNOSIS — Z960 Presence of urogenital implants: Secondary | ICD-10-CM

## 2024-02-28 DIAGNOSIS — E785 Hyperlipidemia, unspecified: Secondary | ICD-10-CM | POA: Diagnosis present

## 2024-02-28 DIAGNOSIS — J441 Chronic obstructive pulmonary disease with (acute) exacerbation: Secondary | ICD-10-CM | POA: Insufficient documentation

## 2024-02-28 DIAGNOSIS — D494 Neoplasm of unspecified behavior of bladder: Secondary | ICD-10-CM | POA: Insufficient documentation

## 2024-02-28 DIAGNOSIS — K219 Gastro-esophageal reflux disease without esophagitis: Secondary | ICD-10-CM | POA: Diagnosis present

## 2024-02-28 DIAGNOSIS — I6521 Occlusion and stenosis of right carotid artery: Secondary | ICD-10-CM | POA: Insufficient documentation

## 2024-02-28 DIAGNOSIS — D638 Anemia in other chronic diseases classified elsewhere: Secondary | ICD-10-CM | POA: Diagnosis present

## 2024-02-28 DIAGNOSIS — J9611 Chronic respiratory failure with hypoxia: Secondary | ICD-10-CM | POA: Diagnosis present

## 2024-02-28 DIAGNOSIS — C679 Malignant neoplasm of bladder, unspecified: Secondary | ICD-10-CM

## 2024-02-28 DIAGNOSIS — Z9889 Other specified postprocedural states: Secondary | ICD-10-CM

## 2024-02-28 DIAGNOSIS — Z8551 Personal history of malignant neoplasm of bladder: Secondary | ICD-10-CM

## 2024-02-28 DIAGNOSIS — Z79899 Other long term (current) drug therapy: Secondary | ICD-10-CM

## 2024-02-28 DIAGNOSIS — F32A Depression, unspecified: Secondary | ICD-10-CM | POA: Diagnosis present

## 2024-02-28 DIAGNOSIS — Z9981 Dependence on supplemental oxygen: Secondary | ICD-10-CM

## 2024-02-28 DIAGNOSIS — F17210 Nicotine dependence, cigarettes, uncomplicated: Secondary | ICD-10-CM | POA: Diagnosis present

## 2024-02-28 DIAGNOSIS — Z7902 Long term (current) use of antithrombotics/antiplatelets: Secondary | ICD-10-CM | POA: Insufficient documentation

## 2024-02-28 DIAGNOSIS — I10 Essential (primary) hypertension: Secondary | ICD-10-CM | POA: Diagnosis present

## 2024-02-28 DIAGNOSIS — I272 Pulmonary hypertension, unspecified: Secondary | ICD-10-CM | POA: Diagnosis present

## 2024-02-28 DIAGNOSIS — N3001 Acute cystitis with hematuria: Principal | ICD-10-CM | POA: Insufficient documentation

## 2024-02-28 DIAGNOSIS — I2510 Atherosclerotic heart disease of native coronary artery without angina pectoris: Secondary | ICD-10-CM | POA: Insufficient documentation

## 2024-02-28 DIAGNOSIS — N179 Acute kidney failure, unspecified: Secondary | ICD-10-CM | POA: Insufficient documentation

## 2024-02-28 DIAGNOSIS — R310 Gross hematuria: Principal | ICD-10-CM

## 2024-02-28 DIAGNOSIS — K7460 Unspecified cirrhosis of liver: Secondary | ICD-10-CM | POA: Diagnosis present

## 2024-02-28 DIAGNOSIS — F1010 Alcohol abuse, uncomplicated: Secondary | ICD-10-CM | POA: Diagnosis present

## 2024-02-28 DIAGNOSIS — D6959 Other secondary thrombocytopenia: Secondary | ICD-10-CM | POA: Diagnosis present

## 2024-02-28 DIAGNOSIS — Z7982 Long term (current) use of aspirin: Secondary | ICD-10-CM | POA: Insufficient documentation

## 2024-02-28 LAB — CBC
HCT: 26.2 % — ABNORMAL LOW (ref 36.0–46.0)
HCT: 25.3 % — ABNORMAL LOW (ref 36.0–46.0)
HGB: 9.1 g/dL — ABNORMAL LOW (ref 13.9–16.3)
HGB: 8.9 g/dL — ABNORMAL LOW (ref 13.9–16.3)
MCH: 32.3 pg (ref 25.4–34.0)
MCH: 32.5 pg (ref 25.4–34.0)
MCHC: 34.9 g/dL (ref 30.0–37.0)
MCHC: 35 g/dL (ref 30.0–37.0)
MCV: 92.6 fL (ref 80.0–100.0)
MCV: 92.8 fL (ref 80.0–100.0)
MPV: 8.9 fL (ref 7.5–11.5)
MPV: 8.5 fL (ref 7.5–11.5)
PLATELETS: 80 10*3/uL — ABNORMAL LOW (ref 130–400)
PLATELETS: 70 10*3/uL — ABNORMAL LOW (ref 130–400)
RBC: 2.83 10*6/uL — ABNORMAL LOW (ref 4.30–5.90)
RBC: 2.73 10*6/uL — ABNORMAL LOW (ref 4.30–5.90)
RDW: 17.4 % — ABNORMAL HIGH (ref 11.5–14.0)
RDW: 17 % — ABNORMAL HIGH (ref 11.5–14.0)
WBC: 6.1 10*3/uL (ref 4.5–11.5)
WBC: 4.8 10*3/uL (ref 4.5–11.5)

## 2024-02-28 LAB — GOLD TOP TUBE

## 2024-02-28 MED ORDER — SODIUM CHLORIDE 0.9 % (FLUSH) INJECTION SYRINGE
2.0000 mL | INJECTION | INTRAMUSCULAR | Status: DC | PRN
Start: 2024-02-28 — End: 2024-02-29

## 2024-02-28 MED ORDER — OLANZAPINE 10 MG TABLET
10.0000 mg | ORAL_TABLET | Freq: Every evening | ORAL | Status: DC
Start: 2024-02-28 — End: 2024-02-29
  Administered 2024-02-28: 10 mg via ORAL
  Filled 2024-02-28: qty 1

## 2024-02-28 MED ORDER — SENNOSIDES 8.6 MG-DOCUSATE SODIUM 50 MG TABLET
1.0000 | ORAL_TABLET | Freq: Two times a day (BID) | ORAL | Status: DC
Start: 2024-02-28 — End: 2024-02-29
  Administered 2024-02-28 – 2024-02-29 (×2): 1 via ORAL
  Filled 2024-02-28 (×2): qty 1

## 2024-02-28 MED ORDER — DEXTROSE 5% IN WATER (D5W) FLUSH BAG - 250 ML
INTRAVENOUS | Status: DC | PRN
Start: 2024-02-28 — End: 2024-02-29

## 2024-02-28 MED ORDER — TRAZODONE 50 MG TABLET
50.0000 mg | ORAL_TABLET | Freq: Every evening | ORAL | Status: DC
Start: 2024-02-28 — End: 2024-02-29
  Administered 2024-02-28: 50 mg via ORAL
  Filled 2024-02-28: qty 1

## 2024-02-28 MED ORDER — HYOSCYAMINE 0.125 MG SUBLINGUAL TABLET
0.1250 mg | SUBLINGUAL_TABLET | SUBLINGUAL | Status: DC | PRN
Start: 2024-02-28 — End: 2024-03-09
  Administered 2024-02-28: 0.125 mg via SUBLINGUAL
  Filled 2024-02-28 (×2): qty 1

## 2024-02-28 MED ORDER — BUDESONIDE-FORMOTEROL HFA 160 MCG-4.5 MCG/ACTUATION AEROSOL INHALER
1.0000 | INHALATION_SPRAY | Freq: Two times a day (BID) | RESPIRATORY_TRACT | Status: DC
Start: 2024-02-28 — End: 2024-02-29
  Administered 2024-02-28: 1 via RESPIRATORY_TRACT
  Filled 2024-02-28: qty 6

## 2024-02-28 MED ORDER — ROSUVASTATIN 5 MG TABLET
10.0000 mg | ORAL_TABLET | Freq: Every evening | ORAL | Status: DC
Start: 2024-02-28 — End: 2024-02-29
  Administered 2024-02-28: 10 mg via ORAL
  Filled 2024-02-28: qty 2

## 2024-02-28 MED ORDER — SODIUM CHLORIDE 0.9% FLUSH BAG - 250 ML
INTRAVENOUS | Status: DC | PRN
Start: 2024-02-28 — End: 2024-02-29

## 2024-02-28 MED ORDER — OXYGEN
4.0000 L/min | GAS_FOR_INHALATION | RESPIRATORY_TRACT | Status: DC
Start: 2024-02-28 — End: 2024-02-28

## 2024-02-28 MED ORDER — ONDANSETRON HCL (PF) 4 MG/2 ML INJECTION SOLUTION
4.0000 mg | Freq: Four times a day (QID) | INTRAMUSCULAR | Status: DC | PRN
Start: 2024-02-28 — End: 2024-02-29

## 2024-02-28 MED ORDER — PANTOPRAZOLE 40 MG TABLET,DELAYED RELEASE
40.0000 mg | DELAYED_RELEASE_TABLET | Freq: Every evening | ORAL | Status: DC
Start: 2024-02-28 — End: 2024-02-29
  Administered 2024-02-28: 40 mg via ORAL
  Filled 2024-02-28: qty 1

## 2024-02-28 MED ORDER — SULFAMETHOXAZOLE 800 MG-TRIMETHOPRIM 160 MG TABLET
1.0000 | ORAL_TABLET | ORAL | Status: DC
Start: 2024-02-28 — End: 2024-03-02
  Administered 2024-02-28: 160 mg via ORAL
  Filled 2024-02-28: qty 1

## 2024-02-28 MED ORDER — HYOSCYAMINE SULFATE 0.125 MG TABLET
0.1250 mg | ORAL_TABLET | ORAL | Status: DC | PRN
Start: 2024-02-28 — End: 2024-02-28

## 2024-02-28 MED ORDER — ACETAMINOPHEN 325 MG TABLET
650.0000 mg | ORAL_TABLET | Freq: Four times a day (QID) | ORAL | Status: DC | PRN
Start: 2024-02-28 — End: 2024-02-29

## 2024-02-28 MED ORDER — SODIUM CHLORIDE 0.9 % (FLUSH) INJECTION SYRINGE
2.0000 mL | INJECTION | Freq: Three times a day (TID) | INTRAMUSCULAR | Status: DC
Start: 2024-02-28 — End: 2024-02-29
  Administered 2024-02-28: 0 mL
  Administered 2024-02-28: 5 mL

## 2024-02-28 NOTE — Care Plan (Signed)
 Problem: Adult Inpatient Plan of Care  Goal: Plan of Care Review  02/28/2024 2124 by Mariea Clonts, RN  Outcome: Ongoing (see interventions/notes)  02/28/2024 2124 by Mariea Clonts, RN  Outcome: Ongoing (see interventions/notes)  Goal: Patient-Specific Goal (Individualized)  02/28/2024 2124 by Mariea Clonts, RN  Outcome: Ongoing (see interventions/notes)  Flowsheets (Taken 02/28/2024 2000)  Individualized Care Needs: standby assist  Anxieties, Fears or Concerns: Something to eat.  02/28/2024 2124 by Mariea Clonts, RN  Outcome: Ongoing (see interventions/notes)  Flowsheets (Taken 02/28/2024 2000)  Individualized Care Needs: standby assist  Anxieties, Fears or Concerns: Something to eat.  Goal: Absence of Hospital-Acquired Illness or Injury  02/28/2024 2124 by Mariea Clonts, RN  Outcome: Ongoing (see interventions/notes)  02/28/2024 2124 by Mariea Clonts, RN  Outcome: Ongoing (see interventions/notes)  Intervention: Prevent Skin Injury  Recent Flowsheet Documentation  Taken 02/28/2024 2000 by Mariea Clonts, RN  Skin Protection:   adhesive use limited   advanced skin protectant  Intervention: Prevent and Manage VTE (Venous Thromboembolism) Risk  Recent Flowsheet Documentation  Taken 02/28/2024 2000 by Mariea Clonts, RN  VTE Prevention/Management:   ambulation promoted   dorsiflexion/plantar flexion performed   sequential compression devices off  Goal: Optimal Comfort and Wellbeing  02/28/2024 2124 by Mariea Clonts, RN  Outcome: Ongoing (see interventions/notes)  02/28/2024 2124 by Mariea Clonts, RN  Outcome: Ongoing (see interventions/notes)  Goal: Rounds/Family Conference  02/28/2024 2124 by Mariea Clonts, RN  Outcome: Ongoing (see interventions/notes)  02/28/2024 2124 by Mariea Clonts, RN  Outcome: Ongoing (see interventions/notes)

## 2024-02-28 NOTE — Discharge Summary (Signed)
 [facility]  DISCHARGE SUMMARY    PATIENT NAME:  Hector [name], Hector [name]  MRN:  [id number]  DOB:  19-Dec-1960    ENCOUNTER DATE:  02/26/2024  INPATIENT ADMISSION DATE:   DISCHARGE DATE:  02/28/2024    ATTENDING PHYSICIAN: Terance Ice, DO  SERVICE: [facility] FAMILY MEDICINE 2  PRIMARY CARE PHYSICIAN: Robin Searing, MD       No lay caregiver identified.    PRIMARY DISCHARGE DIAGNOSIS: Hematuria  Active Hospital Problems    Diagnosis Date Noted    Principal Problem: Hematuria [R31.9] 10/20/2023    History of bladder cancer [Z85.51] 02/27/2024    Chronic respiratory failure with hypoxia (CMS [facility]) [J96.11] 01/18/2024      Resolved Hospital Problems   No resolved problems to display.     Active Non-Hospital Problems    Diagnosis Date Noted    Pulmonary HTN (CMS HCC) 02/11/2024    Acute cystitis without hematuria 01/16/2024    Bladder tumor 01/16/2024    Inability to perform activities of daily living 01/15/2024    AKI (acute kidney injury) (CMS HCC) 01/06/2024    COPD exacerbation (CMS HCC) 01/05/2024    Urinary frequency 10/20/2023    Carotid stenosis, asymptomatic, right 06/29/2023    Carotid stenosis, right 06/03/2023    Coronary artery calcification seen on CT scan 06/03/2023    Mental disorder 09/25/2021    Dyslipidemia 09/25/2021    Annual physical exam 09/25/2021    Chronic obstructive pulmonary disease (CMS HCC) 06/18/2021    Tobacco use disorder 05/29/2021    Cirrhosis of liver without ascites (CMS HCC) 05/01/2021    Alcohol abuse 05/01/2021             Current Discharge Medication List        CONTINUE these medications - NO CHANGES were made during your visit.        Details   albuterol sulfate 90 mcg/actuation oral inhaler  Commonly known as: Ventolin HFA   2 Puffs, Inhalation, EVERY 6 HOURS PRN  Qty: 1 Each  Refills: 5     fluticasone propion-salmeteroL 230-21 mcg/actuation oral inhaler  Commonly known as: Advair HFA   2 Puffs, Inhalation, 2 TIMES DAILY, RINSE MOUTH WITH WATER AFTER INHALER USE  Qty: 1 g  Refills:  5     hyoscyamine sulfate 0.125 mg Tablet  Commonly known as: LEVSIN   0.125 mg, Oral, EVERY 4 HOURS PRN  Qty: 20 Tablet  Refills: 0     ipratropium-albuteroL 0.5 mg-3 mg(2.5 mg base)/3 mL nebulizer solution  Commonly known as: DUONEB   3 mL, Nebulization, 3 TIMES DAILY PRN  Qty: 270 mL  Refills: 1     ketorolac tromethamine 10 mg Tablet  Commonly known as: TORADOL   10 mg, Oral, EVERY 6 HOURS PRN  Qty: 15 Tablet  Refills: 0     OLANZapine 10 mg Tablet  Commonly known as: zyPREXA   10 mg, NIGHTLY  Refills: 0     oxygen gas  Commonly known as: o2   4 L/min, CONTINUOUS  Refills: 0     pantoprazole 40 mg Tablet, Delayed Release (E.C.)  Commonly known as: PROTONIX   TAKE 1 TABLET (40 MG TOTAL) BY MOUTH TWICE A DAY 30 MINTUTES BEFORE MEALS FOR 90 DAYS  Qty: 60 Tablet  Refills: 3     rosuvastatin 10 mg Tablet  Commonly known as: CRESTOR   10 mg, Oral, EVERY EVENING  Qty: 90 Tablet  Refills: 1  sulfamethoxazole-trimethoprim 800-160 mg tablet  Commonly known as: BACTRIM DS   160 mg, Oral, EVERY 24 HOURS  Qty: 7 Tablet  Refills: 0     traZODone 50 mg Tablet  Commonly known as: DESYREL   50 mg, NIGHTLY  Refills: 0            ASK your doctor about these medications.        Details   mirabegron 25 mg Tablet Sustained Release 24 hr  Commonly known as: MYRBETRIQ  Ask about: Should I take this medication?   25 mg, Oral, DAILY  Qty: 30 Tablet  Refills: 0     nitrofurantoin monohyd/m-cryst 100 mg Capsule  Commonly known as: Macrobid  Ask about: Should I take this medication?   100 mg, Oral, 2 TIMES DAILY  Qty: 14 Capsule  Refills: 0            Discharge med list refreshed?  YES     Allergies   Allergen Reactions    Bee Venom Protein (Honey Bee)      Bee stings     HOSPITAL PROCEDURE(S):   No orders of the defined types were placed in this encounter.      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 Patient reports since TURBT he has had persistent hematuria with clots. He has been struggling at home with clots and came to the ER on 03/13 and had manual irrigation to clear the clots. The pain got increasingly worse and he continued to struggle to pass clots so he came to the ER for evaluation. He discontinued his Plavix and aspirin in February d/t continuous hematuria.    BRIEF HOSPITAL NARRATIVE:     Patient was admitted for gross hematuria post TURBT 3/12. In the setting of high grade T1 bladder cancer. Urology was consulted and recommended transfter to tertiary centere. Patient was started on CBI.  Serial CBC was done and hemoglobin initially dropping but has stabilized at the time of transfer. Type and cross was done and blood transfusion is pending if needed to be ordered. [facility] was consulted and patient will bet transferred in stable condition.      TRANSITION/POST DISCHARGE CARE/PENDING TESTS/REFERRALS: Pending transfer to [facility] / [facility] for further care.    CONDITION ON DISCHARGE:  A. Ambulation: Full ambulation  B. Self-care Ability: With partial assistance  C. Cognitive Status Alert and Oriented x 3  D. Code status at discharge:     Physical Exam  Constitutional:       Appearance: He is overweight. He is ill-appearing.   HENT:      Head: Normocephalic and atraumatic.      Right Ear: External ear normal.      Left Ear: External ear normal.      Mouth/Throat:      Mouth: Mucous membranes are moist.      Pharynx: Oropharynx is clear.   Eyes:      Conjunctiva/sclera: Conjunctivae normal.   Cardiovascular:      Rate and Rhythm: Normal rate and regular rhythm.      Pulses: Normal pulses.      Heart sounds: Murmur heard.  Systolic murmur is present with a grade of 2/6.      No friction rub. No gallop.    Pulmonary:      Effort: Pulmonary effort is normal. No respiratory distress.      Breath sounds: Normal breath sounds. No stridor. No wheezing or rhonchi.   Abdominal:      General: Abdomen is flat. Bowel sounds are normal. There is no distension.      Palpations: Abdomen is soft. There is no mass.      Hernia: No hernia is present.   Musculoskeletal:      Right lower leg: No edema.      Left lower leg: No edema.   Neurological:      General: No focal deficit present.      Mental Status: He is alert and oriented to person, place, and time.   Psychiatric:         Mood and Affect: Mood normal.             LINES/DRAINS/WOUNDS AT DISCHARGE:   Patient Lines/Drains/Airways Status       Active Line / Dialysis Catheter / Dialysis Graft / Drain / Airway / Wound       Name Placement date Placement time Site Days    Peripheral IV Right Median Cubital  (antecubital fossa) 02/26/24  1410  -- 1    Foley Catheter 02/26/24  1850  -- 1    Wound   Right;Medial Thigh 01/16/24  0059  -- 43                    DISCHARGE DISPOSITION:  Transfer to acute care facility.  The patient is being transferred to [facility] due to the need for higher-level care.  The benefits of the transfer outweigh the risks, and the patient has consented to the transfer.  The accepting facility has available space, time, and capacities to perform necessary treatment.    Patient's care team has verified the capability at the accepting facility.  The nearest facility for the procedure was discussed with the patient.  Consideration for alternative procedures that may be performed at [facility] was considered.  DISCHARGE INSTRUCTIONS:  Post-Discharge Follow Up Appointments       Follow up with Robin Searing, MD    Phone: [phone]    Where: [facility]      Wednesday Mar 09, 2024    Return Telephone Visit with Isaias Sakai, APRN,AGPCNP-BC at 11:30 AM      Monday Mar 14, 2024    Return Patient Visit with Consuelo Pandy, PA-C at  1:40 PM      Tuesday Apr 26, 2024    Return Patient Visit with Leim Fabry at  1:00 PM      Wednesday Apr 27, 2024    Return Patient Visit with Robin Searing, MD at 10:00 AM      Friday Feb 03, 2025    Ultrasound with [facility] at  9:00 AM    Return Patient Visit with Gerilyn Nestle, PA-C at 10:00 AM      Gastroenterology, [facility] 3  [facility] 3, [place]  [address] [place] [address]  [phone] Imaging Services, [facility] 1  [facility] 1, [facility]  [address]  [place] [address]  [phone] Internal Medicine, [facility], [place]  [address]  [place] [address]  [phone]  Pulmonology, [facility], [address] [place]  [address]  [place] [address]  [phone] Urology Oncology, [facility]  [place], [facility]  [address] [facility]  [place] [address]  [phone] Vascular Surgery, [facility] 1  [facility] 1, [facility]  [address]  [place] [address]  [phone]          No discharge procedures on file.       Estrella Myrtle, DO    Copies sent to Care Team         Relationship Specialty Notifications Start End    Robin Searing, MD PCP - General INTERNAL MEDICINE Admissions 07/22/21     Phone: [phone] Fax: [phone]         [address] [place] [address]    Leim Fabry  FAMILY NURSE PRACTITIONER Admissions 06/22/23     Phone: [phone] Fax: [phone]         [phone] [place] [address]            Referring providers can utilize [email or web address] to access their referred [facility] Medicine patient's information.              I saw and examined the patient with the resident. I reviewed the resident's note. I agree with the physical exam findings and plan of care as documented in  the resident's note. Any exceptions/additions are edited/noted.   Terance Ice, DO

## 2024-02-28 NOTE — Care Plan (Signed)
 Problem: Wound  Goal: Optimal Coping  Outcome: Ongoing (see interventions/notes)  Goal: Optimal Functional Ability  Outcome: Ongoing (see interventions/notes)  Goal: Absence of Infection Signs and Symptoms  Outcome: Ongoing (see interventions/notes)  Goal: Improved Oral Intake  Outcome: Ongoing (see interventions/notes)  Goal: Optimal Pain Control and Function  Outcome: Ongoing (see interventions/notes)  Goal: Skin Health and Integrity  Outcome: Ongoing (see interventions/notes)  Intervention: Optimize Skin Protection  Recent Flowsheet Documentation  Taken 02/28/2024 1127 by Werner Lean  Pressure Reduction Techniques: Frequent weight shifting encouraged  Pressure Reduction Devices: Repositioning wedges/pillows utilized  Goal: Optimal Wound Healing  Outcome: Ongoing (see interventions/notes)  Intervention: Promote Wound Healing  Recent Flowsheet Documentation  Taken 02/28/2024 1127 by Werner Lean  Pressure Reduction Techniques: Frequent weight shifting encouraged  Pressure Reduction Devices: Repositioning wedges/pillows utilized     Problem: Adult Inpatient Plan of Care  Goal: Plan of Care Review  Outcome: Ongoing (see interventions/notes)  Goal: Patient-Specific Goal (Individualized)  Outcome: Ongoing (see interventions/notes)  Goal: Absence of Hospital-Acquired Illness or Injury  Outcome: Ongoing (see interventions/notes)  Intervention: Identify and Manage Fall Risk  Recent Flowsheet Documentation  Taken 02/28/2024 1127 by Werner Lean  Safety Promotion/Fall Prevention:   activity supervised   fall prevention program maintained  Intervention: Prevent Skin Injury  Recent Flowsheet Documentation  Taken 02/28/2024 1127 by Werner Lean  Skin Protection: adhesive use limited  Intervention: Prevent and Manage VTE (Venous Thromboembolism) Risk  Recent Flowsheet Documentation  Taken 02/28/2024 0800 by Werner Lean  VTE Prevention/Management: sequential compression devices on  Intervention: Prevent Infection  Recent Flowsheet  Documentation  Taken 02/28/2024 1127 by Werner Lean  Infection Prevention:   promote handwashing   rest/sleep promoted  Goal: Optimal Comfort and Wellbeing  Outcome: Ongoing (see interventions/notes)  Goal: Rounds/Family Conference  Outcome: Ongoing (see interventions/notes)     Problem: Activity Intolerance  Goal: Enhanced Capacity and Energy  Outcome: Ongoing (see interventions/notes)     Problem: Pain Acute  Goal: Optimal Pain Control and Function  Outcome: Ongoing (see interventions/notes)     Problem: Malnutrition  Goal: Improved Nutritional Intake  Outcome: Ongoing (see interventions/notes)     Problem: Oral Intake Inadequate  Goal: Improved Oral Intake  Outcome: Ongoing (see interventions/notes)

## 2024-02-28 NOTE — H&P (Signed)
 [facility]  DEPARTMENT OF UROLOGY  HISTORY AND PHYSICAL    Hector [name], Hector [name], 63 y.o. male  Date of Admission:  02/28/2024  Date of Service:  02/28/2024  Date of Birth:  [birth year]  PCP: Robin Searing, MD.  Information Obtained from: patient, health care provider, and history reviewed via medical record      Impression:   63 y.o. male with:    1. Gross hematuria status post TURBT 02/24/2024  improved  2. High-grade T1 NMIBC   3. 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, tobacco abuse    Plan:   - Admit to the Urology Service  - On arrival his hematuria appears to be resolved and his catheter was irrigated with return of no clot.  His CBI was discontinued, we will check on his urine later in the afternoon to ensure CBI does not need to be restarted overnight, maintain NPO status until urine recheck.  - Maintain Foley catheter to gravity drainage  - We will continue to trend his CBC daily  - Continue prophylactic Bactrim DS daily  - appropriate home medications ordered, continue on home oxygen  - SCD's for DVT PPX   - OOB, ambulate TID   - IS encouraged 10x/hr      HPI:    Hector [name] is a 63 y.o. male with a h/o COPD on 3 L NC at baseline, HTN, HLD, cirrhosis with chronic thrombocytopenia and anemia secondary to alcohol use disorder, and tobacco abuse who underwent TURBT with [facility] for a large bladder mass on 02/24/2024.  He was discharged with a Foley catheter in place.  He presented to [facility] with gross hematuria and clot retention.  He was maintained on CBI until transferred to [facility] for possible intervention.  His hemoglobin has remained stable for the past 24 hours.  He has not required any blood transfusions.  Upon arrival he was afebrile and hemodynamically stable.  He denies any fevers, chills, nausea, vomiting.    My interpretation of imaging:   CTAP without contrast 02/27/2024 reviewed:  My impression is the bladder is appropriately decompressed with a Foley catheter in place, no significant clot burden within the bladder.  No bilateral hydroureteronephrosis..     ROS:     ROS Other than ROS in the HPI, all other systems were negative.    PAST MEDICAL/ FAMILY/ SOCIAL HISTORY:      Past Medical History:   Diagnosis Date    Alcohol abuse     Carotid stenosis     right side stent    Chronic obstructive airway disease (CMS HCC)     Cirrhosis (CMS HCC)     Closed fracture of transverse process of thoracic vertebra (CMS HCC) 05/01/2021    Coma (CMS HCC)     after fall due to alcholism May 2022    Delirium, withdrawal, alcoholic (CMS HCC)     Depression     Encephalopathy 05/15/2021    Esophageal reflux     H/O urinary tract infection     1/25    HTN (hypertension)     Hyperlipidemia     Ileus (CMS HCC) 05/18/2021    Left pulmonary contusion 05/01/2021    Leukopenia 05/18/2021    Multiple rib fractures 05/01/2021    Oxygen dependent     Pleural effusion 05/11/2021    Serum ammonia increased (  CMS HCC) 05/15/2021    Shortness of breath     Spleen injury 05/01/2021    Status post thoracentesis 05/19/2021    Thrombocytopenia (CMS HCC) 05/01/2021    Unknown cause of injury     fx nose    Wears glasses          Allergies   Allergen Reactions    Bee Venom Protein (Honey Bee)      Bee stings     Medications Prior to Admission       Prescriptions    albuterol sulfate (VENTOLIN HFA) 90 mcg/actuation Inhalation oral inhaler    Take 2 Puffs by inhalation Every 6 hours as needed for Other (SHORTNESS OF BREATH,COUGH OR WHEEZING)    fluticasone propion-salmeteroL (ADVAIR HFA) 230-21 mcg/actuation Inhalation oral inhaler    Take 2 Puffs by inhalation Twice daily RINSE MOUTH WITH WATER AFTER INHALER USE    hyoscyamine sulfate (LEVSIN) 0.125 mg Oral Tablet    Take 1 Tablet (0.125 mg total) by mouth Every 4 hours as needed for up to 14 days    ipratropium-albuterol 0.5 mg-3 mg(2.5  mg base)/3 mL Solution for Nebulization    TAKE 3 ML BY NEBULIZATION THREE TIMES A DAY AS NEEDED FOR WHEEZING    ketorolac tromethamine (TORADOL) 10 mg Oral Tablet    Take 1 Tablet (10 mg total) by mouth Every 6 hours as needed for Pain for up to 15 doses    mirabegron (MYRBETRIQ) 25 mg Oral Tablet Sustained Release 24 hr    Take 1 Tablet (25 mg total) by mouth Once a day for 30 days    nitrofurantoin monohyd/m-cryst (MACROBID) 100 mg Oral Capsule    Take 1 Capsule (100 mg total) by mouth Twice daily for 7 days    Patient not taking:  Reported on 02/03/2024    OLANZapine (ZYPREXA) 10 mg Oral Tablet    Take 1 Tablet (10 mg total) by mouth Every night    oxygen (O2) Inhalation gas    Administer 4 L/min into affected nostril(s) continuous    pantoprazole (PROTONIX) 40 mg Oral Tablet, Delayed Release (E.C.)    TAKE 1 TABLET (40 MG TOTAL) BY MOUTH TWICE A DAY 30 MINTUTES BEFORE MEALS FOR 90 DAYS    rosuvastatin (CRESTOR) 10 mg Oral Tablet    TAKE 1 TABLET BY MOUTH EVERY DAY IN THE EVENING    traZODone (DESYREL) 50 mg Oral Tablet    Take 1 Tablet (50 mg total) by mouth Every night Unsure dosage    trimethoprim-sulfamethoxazole (BACTRIM DS) 160-800mg  per tablet    Take 1 Tablet (160 mg total) by mouth Every 24 hours for 7 days           No current facility-administered medications for this encounter.    Past Surgical History:   Procedure Laterality Date    CAROTID STENT      COLONOSCOPY  08/2021    ESOPHAGOGASTRODUODENOSCOPY  08/2021    HX OTHER Right 06/29/2023    TCAR    HX TONSILLECTOMY      OTHER SURGICAL HISTORY      HX of plastic surgery to right head         Family Medical History:       Problem Relation (Age of Onset)    Asthma Father    Cerebral Aneurysm Sister    Dementia Mother    Heart Attack Paternal Grandmother (22)            Social History  Tobacco Use    Smoking status: Every Day     Current packs/day: 1.00     Average packs/day: 1 pack/day for 51.2 years (51.2 ttl pk-yrs)     Types: Cigarettes     Start  date: 1974    Smokeless tobacco: Never    Tobacco comments:     THE PATIENT STARTED    Vaping Use    Vaping status: Never Used   Substance Use Topics    Alcohol use: Not Currently     Alcohol/week: 15.0 standard drinks of alcohol     Types: 15 Cans of beer per week     Comment:  may 17th, 2022     Drug use: Yes     Types: Marijuana     Comment:  a joint or 2 a day        PHYSICAL EXAMINATION:       Constitutional (e.g. vital signs, general appearance) - 63 y.o. male, resting comfortably in bed  Abdomen - Soft, non-distended, non-tender  Genitourinary - Foley catheter in place on slow drip CBI draining clear yellow urine.       Labs Ordered/ Reviewed   Reviewed: Labs:  Lab Results for Last 24 Hours:    Results for orders placed or performed during the hospital encounter of 02/26/24 (from the past 24 hours)   CBC   Result Value Ref Range    WBC 7.3 4.5 - 11.5 x10^3/uL    RBC 3.00 (L) 4.30 - 5.90 x10^6/uL    HGB 9.8 (L) 13.9 - 16.3 g/dL    HCT 78.2 (L) 95.6 - 46.0 %    MCV 92.9 80.0 - 100.0 fL    MCH 32.6 25.4 - 34.0 pg    MCHC 35.1 30.0 - 37.0 g/dL    RDW 21.3 (H) 08.6 - 14.0 %    PLATELETS 89 (L) 130 - 400 x10^3/uL    MPV 8.3 7.5 - 11.5 fL   CBC   Result Value Ref Range    WBC 6.2 4.5 - 11.5 x10^3/uL    RBC 2.86 (L) 4.30 - 5.90 x10^6/uL    HGB 9.1 (L) 13.9 - 16.3 g/dL    HCT 57.8 (L) 46.9 - 46.0 %    MCV 93.6 80.0 - 100.0 fL    MCH 31.9 25.4 - 34.0 pg    MCHC 34.1 30.0 - 37.0 g/dL    RDW 62.9 (H) 52.8 - 14.0 %    PLATELETS 83 (L) 130 - 400 x10^3/uL    MPV 8.0 7.5 - 11.5 fL   CBC   Result Value Ref Range    WBC 6.1 4.5 - 11.5 x10^3/uL    RBC 2.83 (L) 4.30 - 5.90 x10^6/uL    HGB 9.1 (L) 13.9 - 16.3 g/dL    HCT 41.3 (L) 24.4 - 46.0 %    MCV 92.6 80.0 - 100.0 fL    MCH 32.3 25.4 - 34.0 pg    MCHC 34.9 30.0 - 37.0 g/dL    RDW 01.0 (H) 27.2 - 14.0 %    PLATELETS 80 (L) 130 - 400 x10^3/uL    MPV 8.9 7.5 - 11.5 fL   GOLD TOP TUBE   Result Value Ref Range    RAINBOW/EXTRA TUBE AUTO RESULT Yes    CBC   Result Value Ref Range     WBC 4.8 4.5 - 11.5 x10^3/uL    RBC 2.73 (L) 4.30 - 5.90 x10^6/uL    HGB 8.9 (L) 13.9 -  16.3 g/dL    HCT 47.8 (L) 29.5 - 46.0 %    MCV 92.8 80.0 - 100.0 fL    MCH 32.5 25.4 - 34.0 pg    MCHC 35.0 30.0 - 37.0 g/dL    RDW 62.1 (H) 30.8 - 14.0 %    PLATELETS 70 (L) 130 - 400 x10^3/uL    MPV 8.5 7.5 - 11.5 fL   GOLD TOP TUBE   Result Value Ref Range    RAINBOW/EXTRA TUBE AUTO RESULT Yes             Samella Parr, DO   [facility]  Department of Urology   02/28/2024, 14:24      I saw and examined the patient.  I reviewed the resident's note.  I agree with the findings and plan of care as documented in the resident's note.  Any exceptions/additions are edited/noted.    Ahmed Abdelhalim Bennie Hind, MD

## 2024-02-29 ENCOUNTER — Telehealth (INDEPENDENT_AMBULATORY_CARE_PROVIDER_SITE_OTHER): Payer: Self-pay | Admitting: Internal Medicine

## 2024-02-29 ENCOUNTER — Other Ambulatory Visit: Payer: Self-pay

## 2024-02-29 LAB — CBC WITH DIFF
BASOPHIL #: <0.1 10*3/uL (ref <=0.20)
BASOPHIL %: 0.8 %
EOSINOPHIL #: <0.1 10*3/uL (ref <=0.50)
EOSINOPHIL %: 2.1 %
HCT: 23.8 % — ABNORMAL LOW (ref 38.9–52.0)
HGB: 8.1 g/dL — ABNORMAL LOW (ref 13.4–17.5)
IMMATURE GRANULOCYTE #: <0.1 10*3/uL (ref <0.10)
IMMATURE GRANULOCYTE %: 0.3 % (ref 0.0–1.0)
LYMPHOCYTE #: 0.98 10*3/uL — ABNORMAL LOW (ref 1.00–4.80)
LYMPHOCYTE %: 26.1 %
MCH: 31.6 pg (ref 26.0–32.0)
MCHC: 34 g/dL (ref 31.0–35.5)
MCV: 93 fL (ref 78.0–100.0)
MONOCYTE #: 0.38 10*3/uL (ref 0.20–1.10)
MONOCYTE %: 10.1 %
MPV: 11.1 fL (ref 8.7–12.5)
NEUTROPHIL #: 2.28 10*3/uL (ref 1.50–7.70)
NEUTROPHIL %: 60.6 %
PLATELETS: 57 10*3/uL — ABNORMAL LOW (ref 150–400)
RBC: 2.56 10*6/uL — ABNORMAL LOW (ref 4.50–6.10)
RDW-CV: 16 % — ABNORMAL HIGH (ref 11.5–15.5)
WBC: 3.8 10*3/uL (ref 3.7–11.0)

## 2024-02-29 LAB — BASIC METABOLIC PANEL
ANION GAP: 4 mmol/L (ref 4–13)
BUN/CREA RATIO: 13 (ref 6–22)
BUN: 12 mg/dL (ref 8–25)
CALCIUM: 8.1 mg/dL — ABNORMAL LOW (ref 8.6–10.3)
CHLORIDE: 110 mmol/L (ref 96–111)
CO2 TOTAL: 23 mmol/L (ref 23–31)
CREATININE: 0.9 mg/dL (ref 0.75–1.35)
ESTIMATED GFR - MALE: >90 mL/min/BSA (ref >=60)
GLUCOSE: 103 mg/dL (ref 65–125)
POTASSIUM: 4.3 mmol/L (ref 3.5–5.1)
SODIUM: 137 mmol/L (ref 136–145)

## 2024-02-29 MED ORDER — SULFAMETHOXAZOLE 800 MG-TRIMETHOPRIM 160 MG TABLET
1.0000 | ORAL_TABLET | ORAL | 0 refills | Status: DC
Start: 2024-02-29 — End: 2024-03-14
  Filled 2024-02-29: qty 4, 4d supply, fill #0

## 2024-02-29 MED ORDER — BUDESONIDE-FORMOTEROL HFA 160 MCG-4.5 MCG/ACTUATION AEROSOL INHALER - RN
2.0000 | Freq: Two times a day (BID) | Status: DC
Start: 2024-02-29 — End: 2024-02-29
  Administered 2024-02-29: 2 via RESPIRATORY_TRACT

## 2024-02-29 NOTE — Discharge Summary (Signed)
 [facility]                                              DISCHARGE SUMMARY      PATIENT NAME:  Hector [name]  MRN:  [id number]  DOB:  [birth year]    ADMISSION DATE:  02/28/2024  DISCHARGE DATE:  02/29/2024    ATTENDING PHYSICIAN: Italy Morley, MD  PRIMARY CARE PHYSICIAN: Robin Searing, MD    ADMISSION DIAGNOSIS:     1. Gross hematuria status post TURBT 02/24/2024  resolved  2. High-grade T1 NMIBC   3. 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, tobacco abuse    DISCHARGE DIAGNOSIS: Same  Active Hospital Problems   (*Primary Problem)    Diagnosis    *Gross hematuria     Chronic  Problems    History of bladder cancer         Date Noted: 02/27/2024      Pulmonary HTN (CMS HCC)         Date Noted: 02/11/2024      Chronic respiratory failure with hypoxia (CMS HCC)         Date Noted: 01/18/2024      Acute cystitis without hematuria         Date Noted: 01/16/2024      Bladder tumor         Date Noted: 01/16/2024      Inability to perform activities of daily living         Date Noted: 01/15/2024      AKI (acute kidney injury) (CMS HCC)         Date Noted: 01/06/2024      COPD exacerbation (CMS HCC)         Date Noted: 01/05/2024      Urinary frequency         Date Noted: 10/20/2023      Hematuria         Date Noted: 10/20/2023      Carotid stenosis, asymptomatic, right         Date Noted: 06/29/2023      Carotid stenosis, right         Date Noted: 06/03/2023      Coronary artery calcification seen on CT scan         Date Noted: 06/03/2023      Mental disorder         Date Noted: 09/25/2021      Dyslipidemia         Date Noted: 09/25/2021      Annual physical exam         Date Noted: 09/25/2021      Chronic obstructive pulmonary disease (CMS HCC)         Date Noted: 06/18/2021      Tobacco use disorder         Date Noted: 05/29/2021      Cirrhosis of liver without ascites (CMS HCC)          Date Noted: 05/01/2021      Alcohol abuse         Date Noted: 05/01/2021  DISCHARGE MEDICATIONS:     Current Discharge Medication List        CONTINUE these medications - NO CHANGES were made during your visit.        Details   albuterol sulfate 90 mcg/actuation oral inhaler  Commonly known as: Ventolin HFA   2 Puffs, Inhalation, EVERY 6 HOURS PRN  Qty: 1 Each  Refills: 5     fluticasone propion-salmeteroL 230-21 mcg/actuation oral inhaler  Commonly known as: Advair HFA   2 Puffs, Inhalation, 2 TIMES DAILY, RINSE MOUTH WITH WATER AFTER INHALER USE  Qty: 1 g  Refills: 5     hyoscyamine sulfate 0.125 mg Tablet  Commonly known as: LEVSIN   0.125 mg, Oral, EVERY 4 HOURS PRN  Qty: 20 Tablet  Refills: 0     ipratropium-albuteroL 0.5 mg-3 mg(2.5 mg base)/3 mL nebulizer solution  Commonly known as: DUONEB   3 mL, Nebulization, 3 TIMES DAILY PRN  Qty: 270 mL  Refills: 1     ketorolac tromethamine 10 mg Tablet  Commonly known as: TORADOL   10 mg, Oral, EVERY 6 HOURS PRN  Qty: 15 Tablet  Refills: 0     OLANZapine 10 mg Tablet  Commonly known as: zyPREXA   10 mg, NIGHTLY  Refills: 0     oxygen gas  Commonly known as: o2   4 L/min, CONTINUOUS  Refills: 0     pantoprazole 40 mg Tablet, Delayed Release (E.C.)  Commonly known as: PROTONIX   TAKE 1 TABLET (40 MG TOTAL) BY MOUTH TWICE A DAY 30 MINTUTES BEFORE MEALS FOR 90 DAYS  Qty: 60 Tablet  Refills: 3     rosuvastatin 10 mg Tablet  Commonly known as: CRESTOR   10 mg, Oral, EVERY EVENING  Qty: 90 Tablet  Refills: 1     sulfamethoxazole-trimethoprim 800-160 mg tablet  Commonly known as: BACTRIM DS   160 mg, Oral, EVERY 24 HOURS  Qty: 7 Tablet  Refills: 0     traZODone 50 mg Tablet  Commonly known as: DESYREL   50 mg, NIGHTLY  Refills: 0            ASK your doctor about these medications.        Details   mirabegron 25 mg Tablet Sustained Release 24 hr  Commonly known as: MYRBETRIQ  Ask about: Should I take this medication?   25  mg, Oral, Daily  Qty: 30 Tablet  Refills: 0     nitrofurantoin monohyd/m-cryst 100 mg Capsule  Commonly known as: Macrobid  Ask about: Should I take this medication?   100 mg, Oral, 2 TIMES DAILY  Qty: 14 Capsule  Refills: 0              DISCHARGE INSTRUCTIONS:   No discharge procedures on file.    REASON FOR HOSPITALIZATION AND HOSPITAL COURSE:    Patient initially presented to [facility] as a transfer from [facility] on 3/16 for gross hematuria requiring CBI following TURBT that was performed on 02/24/24. Throughout his hospital course at [facility] he did not require additional CBI and his urine transitioned from light pink to clear. CT scan did not reveal any retained clot in the bladder. After 36 hours of observation he was discharged to home with close follow up care planned. He was advised to return to the ED if he developed signs of worsening bleeding including obstruction of the foley catheter and worsening retention or signs of infection including fevers, chills, or worsening pain. He will  follow-up in [facility] with Sammuel Bailiff in 4 days for trial of void and review of pathology.     DISCHARGE DISPOSITION: Home discharge     cc: Primary Care Physician:  Robin Searing, MD  [address]  [place] [address]    Silvestre Moment, MD   [facility]  Division of Urology    02/29/2024, 11:27

## 2024-02-29 NOTE — Care Plan (Signed)
 [facility]  Rehabilitation Services  Occupational Therapy Initial Evaluation    Patient Name: Hector [name]  Date of Birth: 03/27/1961  Height: Height: 172.7 cm (5\' 8" )  Weight: Weight: 101 kg (223 lb)  Room/Bed: 868/A  Payor: HEALTH PLAN MEDICAID / Plan: HEALTH PLAN MEDICAID / Product Type: Medicaid [facility] /     Assessment:   (P) Hector [name] tolerated OT evaluation well on thios date. Pt able to ambulate with no issues and without a device. Pt's GF was able to assist patient as needed at home. Pt recommended to go home with assistance once medically cleared and stable for DC      Discharge Needs:   Equipment Recommendation: to be determined        Discharge Disposition: (P) home with assist    JUSTIFICATION OF DISCHARGE RECOMMENDATION   Based on current diagnosis, functional performance prior to admission, and current functional performance, this patient requires continued OT services in (P) home with assist  in order to achieve significant functional improvements.    Plan:   Current Intervention: ADL retraining, balance training, bed mobility training, endurance training, strengthening, therapeutic exercise, transfer training    To provide Occupational therapy services minimum of 1x/week, until discharge.       The risks/benefits of therapy have been discussed with the patient/caregiver and he/she is in agreement with the established plan of care.       Subjective & Objective        02/29/24 0844   Therapist Pager   OT Assigned/ Pager # Verdon Cummins [phone]   Rehab Session   Document Type evaluation   OT Visit Date 02/29/24   Total OT Minutes: 12   Patient Effort good   Symptoms Noted During/After Treatment none   General Information   Patient Profile Reviewed yes   Pertinent History of Current Functional Problem Pt is a 63 year old man who presented to [facility] as a transfer from [facility] on 3/16 for gross hematuria requiring CBI following TURBT that was performed on 02/24/24.   Medical Lines PIV Line;Foley  Catheter   Respiratory Status nasal cannula   Existing Precautions/Restrictions fall precautions;full code   Pre Treatment Status   Pre Treatment Patient Status Patient supine in bed;Call light within reach;Telephone within reach;Patient safety alarm activated;Nurse approved session   Support Present Pre Treatment  None   Communication Pre Treatment  Nurse   Communication Pre Treatment Comment RN medically cleared patient to be seen by therapy   Mutuality/Individual Preferences   Individualized Care Needs OOB with SBA   Living Environment   Lives With significant other   Living Arrangements house   Home Assessment: No Problems Identified   Living Environment Comment Single level home, stairglide to basement   Functional Level Prior   Ambulation 0 - independent   Transferring 0 - independent   Toileting 0 - independent   Bathing 0 - independent   Dressing 0 - independent   Eating 0 - independent   Communication 0 - understands/communicates without difficulty   Swallowing 0-->swallows foods/liquids without difficulty   Vital Signs   O2 Delivery Pre Treatment supplemental O2   O2 Delivery Post Treatment supplemental O2   Vitals Comment vss   Pain Assessment   Pre/Posttreatment Pain Comment none   Coping/Psychosocial Response Interventions   Plan Of Care Reviewed With patient   Cognition   Behavior/Mood Observations alert;cooperative   Orientation Status oriented x 4   RUE Assessment   RUE Assessment WFL- Within  Functional Limits   LUE Assessment   LUE Assessment WFL- Within Functional Limits   Bed Mobility   Supine-Sit Independence stand-by assistance   Sit to Supine, Independence stand-by assistance   Bed Mobility, Assistive Device bed rails;Head of Bed Elevated   Transfer Assessment/Treatment   Sit-Stand Independence stand-by assistance   Stand-Sit Independence stand-by assistance   Gait Assessment/Treatment   Independence  stand-by assistance   Distance in Feet 250   Balance   Comment unsupported   Sitting Balance:  Static good balance   Sitting, Dynamic (Balance) good balance   Sit-to-Stand Balance fair + balance   Standing Balance: Static fair + balance   Standing Balance: Dynamic fair + balance   Post Treatment Status   Post Treatment Patient Status Patient supine in bed;Call light within reach;Telephone within reach;Patient safety alarm activated   Support Present Post Treatment  None   Care Plan Goals   OT Rehab Goals LB Dressing Goal;Transfer Training Goal 2   LB Dressing Goal   LB Dressing Goal, Date Established 02/29/24   LB Dressing Goal, Time to Achieve by discharge   LB Dressing Goal, Activity Type all lower body dressing tasks   LB Dressing Goal, Independence Level independent   Transfer Training Goal 2   Transfer Training Goal, Date Established 02/29/24   Transfer Training Goal, Time to Achieve by discharge   Transfer Training Goal, Activity Type all transfers   Transfer Training Goal, Current Status stand-by assistance   Transfer Training Goal, Independence Level independent   Transfer Training Goal, Assist Device   (LRD)   Planned Therapy Interventions, OT Eval   Planned Therapy Interventions ADL retraining;balance training;bed mobility training;endurance training;strengthening;therapeutic exercise;transfer training   Clinical Impression   Functional Level at Time of Session Hector [name] tolerated OT evaluation well on thios date. Pt able to ambulate with no issues and without a device. Pt's GF was able to assist patient as needed at home. Pt recommended to go home with assistance once medically cleared and stable for DC   Criteria for Skilled Therapeutic Interventions Met (OT) yes;meets criteria   Rehab Potential good   Therapy Frequency minimum of 1x/week   Predicted Duration of Therapy until discharge   Anticipated Equipment Needs at Discharge to be determined   Anticipated Discharge Disposition home with assist   Evaluation Complexity Justification   Occupational Profile Review Expanded review   Performance  Deficits 3-5 deficits;Endurance;Balance;Mobility   Clinical Decision Making Moderate analytic complexity   Evaluation Complexity Moderate       Therapist:   Rosana Berger, OT   Pager #: [phone]

## 2024-02-29 NOTE — Progress Notes (Signed)
 [facility]                                                  UROLOGY PROGRESS NOTE    Patient: Hector [name], Hector [name], 63 y.o. male  Date of Admission:  02/28/2024  Date of Birth:  Jan 02, 1961  Date of Service:  02/29/2024    Assessment:  63 y.o. male with:     1. Gross hematuria status post TURBT 02/24/2024  improved  2. High-grade T1 NMIBC   3. 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, tobacco abuse    Plan:   Urine draining clear yellow urine.  Maintain foley catheter at this time. Will plan for removal in clinic on 3/21.  Will continue holding Aspirin for 48 hours. Can resume on 3/19.  No indication for continuous bladder irrigation at this time.  Patient may have regular diet.    DISPO: anticipate discharge today    Subjective: No acute events overnight. No fever or chills. No issues with catheter overnight.     Vital Signs:  Temperature: 36.8 C (98.2 F) (02/29/24 0434)  BP (Non-Invasive): (!) 129/58 (02/29/24 0340)  MAP (Non-Invasive): 80 mmHG (02/29/24 0340)  Heart Rate: 72 (02/29/24 0340)  Respiratory Rate: 19 (02/29/24 0340)  SpO2: 91 % (02/29/24 0340)    Today's Physical Exam:  General - 63 y.o. male, NAD  HENT -  Head atraumatic, normocephalic  Lungs - Unlabored respiratory effort  Abdomen - Soft, non-distended, non-tender to palpation  Neurological - CN II-XII grossly intact, A&O x 3  Skin - Warm and dry   Psych - Normal affect  GU system - foley draining clear yellow    CBC (Last 24 Hours):    Recent Results last 24 hours     02/28/24  0733 02/29/24  0338   WBC 4.8 3.8   HGB 8.9* 8.1*   HCT 25.3* 23.8*   MCV 92.8 93.0   PLTCNT 70* 57*       BMP (Last 24 Hours):    Recent Results last 24 hours     02/29/24  0338   SODIUM 137   POTASSIUM 4.3   CHLORIDE 110   CO2 23   BUN 12   CREATININE 0.90   CALCIUM 8.1*     Rich Fuchs, MD 06:29 02/29/2024  PGY-4 Resident  Urology  [facility]

## 2024-02-29 NOTE — Care Plan (Signed)
 [facility]  Rehabilitation Services  Physical Therapy Initial Evaluation    Patient Name: Hector [name]  Date of Birth: 13-Jun-1961  Height: Height: 172.7 cm (5\' 8" )  Weight: Weight: 101 kg (223 lb)  Room/Bed: 868/A  Payor: HEALTH PLAN MEDICAID / Plan: HEALTH PLAN MEDICAID / Product Type: Medicaid [facility] /     Assessment:      Hector [name] tolerated PT evaluation well this date.  He presents with mildly impaired endurance but ambulated 250' safely without an assistive device.  Anticipate d/c home with A of girlfriend when medically ready.    Discharge Needs:    Equipment Recommendation: none anticipated     Discharge Disposition: home with assist    Plan:     To provide physical therapy evaluation only.    The risks/benefits of therapy have been discussed with the patient/caregiver and he/she is in agreement with the established plan of care.       Subjective & Objective        02/29/24 0843   Therapist Pager   PT Assigned/ Pager # Jinny Sanders [phone]   Rehab Session   Document Type evaluation   PT Visit Date 02/29/24   Patient Effort good   Symptoms Noted During/After Treatment none   General Information   Patient Profile Reviewed yes   Onset of Illness/Injury or Date of Surgery 02/28/24   Pertinent History of Current Functional Problem Pt is a 63 year old man who presented to [facility] as a transfer from [facility] on 3/16 for gross hematuria requiring CBI following TURBT that was performed on 02/24/24.   Medical Lines PIV Line;Foley Catheter   Respiratory Status nasal cannula   Mutuality/Individual Preferences   Anxieties, Fears or Concerns none   Individualized Care Needs ambulate with standby assist   Patient-Specific Goals (Include Timeframe) to go home   Plan of Care Reviewed With patient   Living Environment   Lives With significant other   Living Arrangements house   Home Assessment: No Problems Identified   Living Environment Comment Single level home, stairglide to basement   Functional Level Prior    Ambulation 0 - independent   Transferring 0 - independent   Toileting 0 - independent   Bathing 0 - independent   Dressing 0 - independent   Eating 0 - independent   Communication 0 - understands/communicates without difficulty   Swallowing 0-->swallows foods/liquids without difficulty   Pre Treatment Status   Pre Treatment Patient Status Patient supine in bed   Support Present Pre Treatment  None   Communication Pre Treatment  Nurse   Communication Pre Treatment Comment agreeable to PT   Cognition   Behavior/Mood Observations alert;cooperative   Orientation Status oriented x 4   Attention WNL/WFL   Follows Commands WNL   Vital Signs   Vitals Comment VSS   Pain Assessment   Pre/Posttreatment Pain Comment no c/o pain   RUE Assessment   RUE Assessment WFL- Within Functional Limits   LUE Assessment   LUE Assessment WFL- Within Functional Limits   RLE Assessment   RLE Assessment WFL- Within Functional Limits   LLE Assessment   LLE Assessment WFL- Within Functional Limits   Bed Mobility   Supine-Sit Independence stand-by assistance   Sit to Supine, Independence stand-by assistance   Bed Mobility, Assistive Device bed rails;Head of Bed Elevated   Transfer Assessment/Treatment   Sit-Stand Independence stand-by assistance   Stand-Sit Independence stand-by assistance   Gait Assessment/Treatment   Independence  stand-by assistance   Distance in Feet 250'   Gait Speed moderate   Comment steady gait without LOB   Balance   Sitting Balance: Static good balance   Sitting, Dynamic (Balance) good balance   Sit-to-Stand Balance fair + balance   Standing Balance: Static fair + balance   Standing Balance: Dynamic fair + balance   Systems Impairment Contributing to Balance Disturbance musculoskeletal   Post Treatment Status   Post Treatment Patient Status Patient supine in bed   Support Present Post Treatment  None   Communication Post Treatement Nurse   Communication Post Treatment Comment pt performance   Plan of Care Review   Plan  Of Care Reviewed With patient   Basic Mobility Am-PAC/6Clicks Score (APPROVED Staff)   Turning in bed without bedrails 4   Lying on back to sitting on edge of flat bed 4   Moving to and from a bed to a chair 4   Standing up from chair 4   Walk in room 4   Climbing 3-5 steps with railing 3   6 Clicks Raw Score total 23   Standardized (t-scale) score 50.88   Patient Mobility Goal (JHHLM) 8- Walk 250 feet or more 3X/day   Exercise/Activity Level Performed 8- Walked 250 feet or more   Physical Therapy Clinical Impression   Assessment Hector [name] tolerated PT evaluation well this date.  He presents with mildly impaired endurance but ambulated 250' safely without an assistive device.  Anticipate d/c home with A of girlfriend when medically ready.   Patient/Family Goals Statement to go home   Criteria for Skilled Therapeutic yes   Pathology/Pathophysiology Noted musculoskeletal   Impairments Found (describe specific impairments) aerobic capacity/endurance;gait, locomotion, and balance   Predicted Duration of Therapy Intervention (days/wks) evaluation only   Anticipated Equipment Needs at Discharge (PT) none anticipated   Anticipated Discharge Disposition home with assist   Evaluation Complexity Justification   Patient History: Co-morbidity/factors that impact Plan of Care One or more other medical co-morbidity   Examination Components Range of motion;Strength;Balance;Bed mobility;Transfers;Ambulation   Presentation Stable: Uncomplicated, straight-forward, problem focused   Clinical Decision Making Low complexity   Evaluation Complexity Low complexity       Therapist:   Pollie Meyer, PT   Pager #: [phone]

## 2024-02-29 NOTE — Nurses Notes (Signed)
 Pt alert and oriented x4, admitted for gross hematuria. CBI clamped since arrival yesterday. Foley output remains clear, amber. Pt to be discharged home with foley, and extra drainage bags until follow up outpt with urology. Pt to be discharged home, d/c instructions given to pt who verbalized understanding. PIV removed, no bleeding noted. All belongings packed up. Pt will wait for transport with wheelchair to lobby where private vehicle is waiting.

## 2024-03-03 ENCOUNTER — Ambulatory Visit: Payer: MEDICAID | Attending: Adult Health | Admitting: Adult Health

## 2024-03-03 ENCOUNTER — Encounter (HOSPITAL_BASED_OUTPATIENT_CLINIC_OR_DEPARTMENT_OTHER): Payer: Self-pay | Admitting: Adult Health

## 2024-03-03 ENCOUNTER — Other Ambulatory Visit: Payer: Self-pay

## 2024-03-03 VITALS — BP 149/65 | HR 78 | Temp 97.7°F | Resp 18 | Ht 69.09 in | Wt 225.1 lb

## 2024-03-03 DIAGNOSIS — C679 Malignant neoplasm of bladder, unspecified: Secondary | ICD-10-CM | POA: Insufficient documentation

## 2024-03-03 DIAGNOSIS — Z9889 Other specified postprocedural states: Secondary | ICD-10-CM | POA: Insufficient documentation

## 2024-03-03 NOTE — Cancer Center Note (Signed)
 UROLOGIC ONCOLOGY SURGERY NOTE    CHIEF COMPLAINT:   New patient visit for pathology from recent TURBT     SUBJECTIVE:  63 y.o. male presents today for POV s/p TURBT. Catheter in place. Doing well.     OBJECTIVE:  BP (!) 149/65 Comment: APRN notified  Pulse 78   Temp 36.5 C (97.7 F)   Resp 18   Ht 1.755 m (5' 9.09")   Wt 102 kg (225 lb 1.6 oz)   SpO2 93%   BMI 33.15 kg/m       General: Patient is vitally stable (see values). Appears calm and at rest. Dressed appropriately.    Skin: Warm and dry.     Eyes: Eyes are clear.    Pulmonary: Respiratory effort is unlabored.     Psychiatric: Patient is alert, appropriate mood, and in no acute distress.     Musculoskeletal: Patient ambulates without assistance   Cardiovascular: Palpable peripheral pulses.    Neurologic: Neurological exam is consistent with patient's age.      Gastrointestinal: The abdomen is soft, nontender and non-distended.   The abdomen is moderately obese.   Genitourinary: No costovertebral angle tenderness bilaterally.  No suprapubic fullness or tenderness.  Foley is draining clear yellow urine.     ASSESSMENT:  63 y.o. male with:    1. High risk High volume non muscle invasive bladder cancer s/p TURBT in March 2025. ([id number])   12/2023 - Gross hematuria  12/2023 - Admit [place] [facility]  01/2024 - CT IVP ( bladder mass)  02/2024 - TURBT Umberto Ganong) cT1HG N0M0 ( 8cm overlaying both UOs)  02/2024 - pathology review Renato Carolus)    2. Past medical history significant for HTN, HLD, COPD, CAD, Pulmonary HTN,   3. Past surgical history significant for Carotid stents   4. Blood thinners: Plavix   5. ECOG: 2 - Up >50% of waking hours, ambulatory, capable of selfcare, unable to carry out any work activities    PLAN:  Pathology reviewed  Schedule re staging TURBT   Consent obtained  Void trail today ( See LPN note)  Patient took abx prior to arrival.     ATTESTATION:  This was a shared visit with Dr. Burman Carrie, APRN,AGPCNP-BC,  03/03/2024 12:26

## 2024-03-03 NOTE — Nursing Note (Signed)
 1132-The previously placed foley catheter was filled with 100 ml of sterile water .  The catheter was removed and the patient given a chance to void.  They were able to void 150 ml.  The measured residual volume was 1 ml.    The patient was instructed to assist their voiding by voiding on a schedule of approximately every 2-3 hours even if they do not have to void.  In addition, they are instructed to double-void.  This means they should void in the bathroom and then attempt to void again a few minutes later, this may allow for elimination of some more left over urine.    Finally, I explained that there is still a chance that they can have urinary retention again.  The risk can be up to 50% chance of another bout of urinary retention.  If they are unable to void in say a 6-8 hour period and they become uncomfortable, they should try some maneuvers at home to help such as running the water  in the bathroom or taking a shower and seeing if they can void there.  If these maneuvers do not help, they should proceed to the nearest emergency department for evaluation.  They can also call us  on the [facility].  Berdie Breach, LPN

## 2024-03-04 ENCOUNTER — Encounter (HOSPITAL_BASED_OUTPATIENT_CLINIC_OR_DEPARTMENT_OTHER): Payer: Self-pay | Admitting: Adult Health

## 2024-03-04 ENCOUNTER — Ambulatory Visit (HOSPITAL_BASED_OUTPATIENT_CLINIC_OR_DEPARTMENT_OTHER): Payer: Self-pay | Admitting: Adult Health

## 2024-03-04 NOTE — Telephone Encounter (Signed)
 Message from Waunita Haff sent at 03/03/2024  3:43 PM EDT    Summary: Returning Call    Copied From CRM [phone].  Hector [name], Hector [name] () is returning a call they received from the staff  Pt stating he received a call about Plavix  and missed the nurse's call if you could please call again.    [phone]            Returned call to patient. Discussed with him about provider that prescribes his Plavix , patient states he doesn't even know what med that is. Then after further discussion he did understand which medicine I was calling him about. He does admit he just restarted taking this medicine either yesterday or today (he wasn't sure). I ask if he could tell me which doctor prescribed the medicine and he said no, ask if he could check the bottle for a doctors name. After he attempted to find the bottle he returned to the call and stated "I can't find the bottle". I informed patient to never mind I will send a message to his PCP. Hosie Macadamia, RN

## 2024-03-08 ENCOUNTER — Encounter (HOSPITAL_COMMUNITY): Payer: Self-pay

## 2024-03-09 ENCOUNTER — Encounter (HOSPITAL_COMMUNITY): Payer: Self-pay

## 2024-03-09 ENCOUNTER — Inpatient Hospital Stay (HOSPITAL_COMMUNITY)
Admission: RE | Admit: 2024-03-09 | Discharge: 2024-03-09 | Disposition: A | Discharge status: Home / Self Care | Payer: MEDICAID | Source: Ambulatory Visit

## 2024-03-09 ENCOUNTER — Telehealth (HOSPITAL_BASED_OUTPATIENT_CLINIC_OR_DEPARTMENT_OTHER): Payer: Self-pay | Admitting: Adult Health

## 2024-03-09 ENCOUNTER — Encounter (HOSPITAL_COMMUNITY): Payer: Self-pay | Admitting: Student in an Organized Health Care Education/Training Program

## 2024-03-09 HISTORY — DX: Malignant (primary) neoplasm, unspecified: C80.1

## 2024-03-11 ENCOUNTER — Inpatient Hospital Stay (HOSPITAL_COMMUNITY)
Admission: RE | Admit: 2024-03-11 | Discharge: 2024-03-11 | Disposition: A | Discharge status: Home / Self Care | Payer: MEDICAID | Source: Ambulatory Visit

## 2024-03-11 ENCOUNTER — Encounter (HOSPITAL_COMMUNITY): Payer: Self-pay

## 2024-03-11 NOTE — Nurses Notes (Signed)
 [place] [facility]    Preoperative Evaluation Center   Department of Anesthesiology      Name: Hector [name], Hector [name]   DOB: 1961-09-17    PRE-PROCEDURE/OPERATIVE INSTRUCTIONS   Preoperative Evaluation Center [facility])  [address], [place], [place] [address]    Thank you for choosing [facility] for your health care needs. [facility] is a tobacco-free campus. Please refrain from using any forms of tobacco while on the premises.     Please review upon receipt and again prior to procedure date.    If, after your PEC call or visit, you experience any of the changes below or develop any of the listed symptoms, Call the Preoperative Evaluation Center [facility]) at [phone].   Change in your best contact phone number.  Changes in your medications, especially starting a new medication.  Changes in your medical history, including an Emergency room visit, a hospital admission, or you receive a new diagnosis.    Develop any of these symptoms:  fever, cough, sore throat, shortness of breath, chills, muscle pain, new loss of taste or smell, vomiting or diarrhea, or fatigue (extreme tiredness or lack of energy).  Diagnosed with conjunctivitis (pink eye) or shingles.  Diagnosed as COVID positive.  Develop a wound, rash, open area or sores.  If you use or are prescribed an antibiotic.     Also contact your PCP to discuss your symptom(s) and the potential need for treatment/ appointments/etc.     PROCEDURE:    Procedure(s):  RESECTION TUMOR BLADDER TRANSURETHRAL    DATE of SURGERY:   March 30, 2024    ARRIVAL LOCATION:   1st Floor Registration     Arrival and diet instruction times will be given the business day prior to your procedure between 2 pm - 5 pm. If you should not hear from anyone by 5 pm the business day prior to your procedure, please call [phone] (Option 4).    Patients and visitors may park for free in the lots in front of the hospital. If you are need assistance from [place] parking lot, we have transportation available to  you.  Call security at [phone] to arrange pick up from the parking lot.    DIET INSTRUCTIONS:   STOP regular diet 8 hours before the arrival time of the surgery/procedure.   STOP clear liquids 3 hours before the arrival time of surgery/procedure  then NPO (NPO means NO FOOD or DRINK).    ACCEPTABLE CLEAR LIQUIDS: Water , fruit juices (white grape or apple), pedialyte, gatorade, contrast dye (limited to non-particulate forms, such as gastrografin), coffee & tea without fat/milk/creamer, and clear broth without fat or protein.     LIQUIDS NOT ACCEPTED: Orange juice, any product colored red/blue/purple, infant formula, breast milk, coffee or tea with fat/milk/creamer, carbonated beverages or any alcoholic beverages.       Your diet instructions are specific for your safety. When not followed, particles left in your stomach could enter your lungs during surgery. Failure to follow the instructions could result in a delay or cancellation of your surgery.            Special Circumstances Diet Instruction: Drink 20oz electrolyte beverage up to 3 hours before scheduled surgery time. The electrolyte beverage must be completed by this time. (Gatorade, Powerade, or Pedialyte, any flavor but exclude colors red/blue/purple).    No tobacco (cigarettes, vaping, snuff, or chewing tobacco) or medical marijuana (all types) 8 hours prior to scheduled arrival time.  No Alcohol, or recreational drugs,  including marijuana, 24 hours prior to scheduled arrival time.  Chewing gum is permitted but do not swallow the gum as your surgery may be cancelled if swallowed.    MEDICATION INSTRUCTIONS:    Meds to take day of procedure with a sip of water : none     Meds to stop: none (patient stated he stopped his plavix  (confirmed with SO) -APP aware.    Avoid taking:   All NSAIDS (Ibuprofen/Motrin/Advil, Aleve/Naprosyn/Anaprox, Diclofenac/Voltaren, Meloxicam/Mobic, Ketorolac /Toradol ). Vitamin E, Multivitamins, Herbals, Fish Oil, Supplements, &  other over the counter meds for 7 days prior to your procedure, unless instructed otherwise. Tylenol  is safe to take up until and including the morning of surgery.    PREP:   Before surgery you can play an important role in your own health. Because skin is not sterile, we need to be sure that your skin is as free from germs as possible before surgery. You can help reduce the number of germs on your skin by carefully washing before surgery. To help make the process more effective, you will need to take 2 showers (one the evening before and one the morning of) using an antibacterial soap. Washing your hair (head) with normal Shampoo is acceptable. Dress in clean clothes after each shower. No shaving for 2 days prior to surgery to prevent any break in the skin. Once you have showered, do not apply deodorant, lotions, moisturizers, makeup, body spray, perfume, cologne, or aftershave unless instructed differently by your Surgeon or their Clinic.    TRANSPORTATION:  You must arrange for a responsible person, 18 or older, to drive you home and stay for 24 hours following discharge after the procedure. Public transportation may only be used in the event you still have the responsible person with you, drivers of the transportation are not responsible for your care.  If you have not arranged for a driver and responsible person to accompany you, your procedure will be cancelled.      If you use home oxygen , bring enough for travel to hospital and return trip home.     OTHER IMPORTANT INFORMATION:  Do not bring money, jewelry, or valuables with you.    Do not wear make-up, nail polish, lotion, powder, or aftershave. Do not wear any jewelry, watches, earrings, rings, or body piercings.    Piercings, including silicone, will be required to be removed in Pre-op area prior to surgery.  Remove your continuous glucose monitoring system prior to arrival as staff are required to check your glucose using facility monitors. Feel free to  bring your device with you as you can reapply it after surgery, upon discharge.    Bring Programmer for implantable devices (Deep Brain Stimulator or Nerve Stimulator for pain/mental health/sleep apnea, etc.) as they may be required to be turned off.  Minors must be accompanied by a parent or legal guardian. The responsible adult must stay with the patient before and after the procedure.  No visitors under age 27 are permitted in the preparation/recovery areas. It is strongly suggested that child-care arrangements be made for other children at home.  If child stays overnight, one parent or other adult family member is encouraged to stay during your child's hospitalization. Each room has a sleeper chair, couch, and shower, you are permitted to use. Siblings are not allowed to stay overnight. Rules are subject to change due to COVID guidelines.  Children may bring a special toy, doll, or blanket.  Wear glasses instead of contact lenses. If possible, bring  a case for your glasses.  It may be possible to wear your hearing aid throughout the procedure. Discuss this with the Pre op nursing staff.  Wear comfortable shoes and clean clothes to the hospital as you will wear them home after the procedure.  Bring any equipment - crutches, splints, etc., that you are already using at home.  Bring any legal paperwork with you (guardianship, custody, Medical Power of Attorney, Designer, industrial/product, etc) if applicable.  Before using the bathroom at the hospital, check with hospital staff for needed specimens.  You must have a valid photo ID to fill prescriptions for narcotics.  If discharged with prescriptions, we offer an in-house delivery.  Narcotics require in-person pick up with valid ID.  Other medications can be delivered to your bedside.    LODGING:    Both the [employer] and [facility] provide "homes away from home" for families of patients. If you, the patient, plan to stay, a responsible person 51  or older, is required to stay with you for 24 hours following discharge, the same as if you are going home after discharge from facility. The [facility], located across the parking lot from the hospital provides lodging and supportive services to adult patients and their families. To be eligible, guests must live 50 miles or more from the [place] area and request a referral from a hospital staff person to be placed on the waiting list. [facility] staff and volunteers can assist you with hotel reservations, usually at a discounted rate, until a room becomes available. [phone]. The [employer], located to the right of [facility] provides temporary lodging to families of children receiving hospital treatment. [phone].    For more information on local motels and a list of private homes providing rental rooms, contact Social Services at [phone].    SPIRITUAL CARE:  [facility] has chaplains available every day for your spiritual needs. Our Interfaith Prayer and Meditation Room is located at [facility] on the first floor between the [employer] and elevators. You may ask your nurse or staff member to contact the on-call chaplain anytime.    For questions regarding instructions please contact PEC at [phone]. Leave a message if no answer.      CANCEL/PROCEDURE/SURGERY:  If you must cancel your procedure, call [phone] (Option 4) between 6 am and 5:30 pm. After 5:30 pm, call [facility] at [phone].

## 2024-03-13 ENCOUNTER — Encounter: Payer: Self-pay | Admitting: Family Medicine

## 2024-03-13 DIAGNOSIS — G629 Polyneuropathy, unspecified: Secondary | ICD-10-CM

## 2024-03-13 MED ORDER — GABAPENTIN 100 MG PO CAPS
100.0000 mg | ORAL_CAPSULE | Freq: Four times a day (QID) | ORAL | 0 refills | Status: DC
Start: 2024-03-13 — End: 2024-04-11
  Filled 2024-03-13 – 2024-03-18 (×2): qty 120, 30d supply, fill #0

## 2024-03-14 ENCOUNTER — Other Ambulatory Visit (HOSPITAL_BASED_OUTPATIENT_CLINIC_OR_DEPARTMENT_OTHER): Payer: Self-pay

## 2024-03-14 ENCOUNTER — Encounter (INDEPENDENT_AMBULATORY_CARE_PROVIDER_SITE_OTHER): Payer: Self-pay | Admitting: Medical

## 2024-03-14 ENCOUNTER — Emergency Department
Admission: EM | Admit: 2024-03-14 | Discharge: 2024-03-14 | Disposition: A | Discharge status: Home / Self Care | Payer: MEDICAID | Attending: EMERGENCY MEDICINE | Admitting: EMERGENCY MEDICINE

## 2024-03-14 ENCOUNTER — Encounter (HOSPITAL_COMMUNITY): Payer: Self-pay | Admitting: EMERGENCY MEDICINE

## 2024-03-14 ENCOUNTER — Encounter (INDEPENDENT_AMBULATORY_CARE_PROVIDER_SITE_OTHER): Payer: Self-pay | Admitting: INTERNAL MEDICINE

## 2024-03-14 ENCOUNTER — Ambulatory Visit (HOSPITAL_BASED_OUTPATIENT_CLINIC_OR_DEPARTMENT_OTHER): Payer: MEDICAID | Admitting: Medical

## 2024-03-14 ENCOUNTER — Other Ambulatory Visit: Payer: Self-pay

## 2024-03-14 ENCOUNTER — Ambulatory Visit (HOSPITAL_COMMUNITY)
Admission: RE | Admit: 2024-03-14 | Discharge: 2024-03-14 | Disposition: A | Discharge status: Home / Self Care | Payer: MEDICAID | Source: Ambulatory Visit

## 2024-03-14 VITALS — BP 142/82 | HR 66 | Ht 69.0 in | Wt 215.0 lb

## 2024-03-14 DIAGNOSIS — R109 Unspecified abdominal pain: Secondary | ICD-10-CM

## 2024-03-14 DIAGNOSIS — R634 Abnormal weight loss: Secondary | ICD-10-CM

## 2024-03-14 DIAGNOSIS — Z860100 Personal history of colon polyps, unspecified: Secondary | ICD-10-CM | POA: Insufficient documentation

## 2024-03-14 DIAGNOSIS — K2270 Barrett's esophagus without dysplasia: Secondary | ICD-10-CM | POA: Insufficient documentation

## 2024-03-14 DIAGNOSIS — K7030 Alcoholic cirrhosis of liver without ascites: Secondary | ICD-10-CM | POA: Insufficient documentation

## 2024-03-14 NOTE — Progress Notes (Signed)
 Pt scheduled for EGD for cirrhosis, barrett's and nausea on 05/26/24 @ 1150. Pt was told to hold Plavix  7 days prior.   Health plan   [employer]

## 2024-03-14 NOTE — ED Provider Notes (Signed)
 [facility] - Emergency Department  ED Primary Provider Note  History of Present Illness   Chief Complaint   Patient presents with    Fall    Shoulder Pain     Hector [name] is a 63 y.o. male who had concerns including Fall and Shoulder Pain.  Arrival: The patient arrived by Car    Patient presents today due to left shoulder pain.  Patient states he was trying to walk off of a step last night and had a ice tea in his right hand and tripped and fell onto his left shoulder.  States that he has had difficulty lifting his shoulder and pain with any movements of the shoulder since then.  States that he is right-handed and has not had any issues with his left shoulder previously.  States that he is able to move his elbow and hand and only has pain in his shoulder.      Fall      History Reviewed This Encounter: Medical History  Surgical History  Family History  Social History    Physical Exam   ED Triage Vitals [03/14/24 1152]   BP (Non-Invasive) (!) 143/85   Heart Rate 77   Respiratory Rate (!) 22   Temperature 36.5 C (97.7 F)   SpO2 96 %   Weight 102 kg (225 lb)   Height 1.753 m (5\' 9" )     Physical Exam  Musculoskeletal:      Right shoulder: Normal.      Left shoulder: Tenderness present. No swelling, deformity, effusion, laceration or bony tenderness. Decreased range of motion.      Comments: Tenderness to palpation over bicipital groove with tenderness with supination and adduction.       Patient Data   Labs Ordered/Reviewed - No data to display  XR SHOULDER LEFT   Final Result by Edi, Radresults In (03/31 1216)   NO ACUTE FRACTURE OR DISLOCATION.                Radiologist location ID: [id number]           Medical Decision Making        Medical Decision Making  Emergency Department Course:   Hector [name] presents to the emergency room for initial presentation of shoulder pain.   On arrival vitals are stable and patient is in no acute distress. Patient is not febrile, tachypneic or tachycardic upon  arrival.  Nursing notes reviewed. History and physical completed above.  No pertinent past medical history.  On clinical exam suspected left bicep strain.  X-ray shoulder completed showing no acute fractures or dislocations.  Discussed results with the patient and shared decision-making for patient to trial over-the-counter analgesics and follow up with primary care provider for possible orthopedic follow up.    Plan/Disposition: decision to discharge  Following the history, physical exam, and ED workup, the patient was deemed stable and suitable for discharge. The patient/caregiver was advised to return to the ED for any new or worsening symptoms. Discharge medications, and follow-up instructions were discussed with the patient/caregiver in detail, who verbalizes understanding. The patient/caregiver is in agreement and is comfortable with the plan of care.   This represents a Low complexity of medical decision making.   Discussion of management: Attending physician Dr.  Drew Herman.  Please see their documentation for evaluation and interaction with this patient.      Amount and/or Complexity of Data Reviewed  Radiology: ordered.  Clinical Impression   Sprain, bicep (Primary)   Shoulder pain, unspecified chronicity, unspecified laterality       Disposition: Discharged           03/15/2024  I saw and examined the patient.  I reviewed the fellow's note.  I agree with the findings and plan of care as documented in the fellow's note.  Any exceptions/additions are edited/noted.  Patient had fall onto left shoulder, no fractures on Xray, pain with abduction over 90 degrees, also pain with abduction and internal and external rotation.  Tender at area of insertion of SITs muscles.  Plan to DC with Ortho FU    Hector Leavelle A Kashira Behunin, MD

## 2024-03-14 NOTE — ED Nurses Note (Signed)

## 2024-03-14 NOTE — Progress Notes (Signed)
 GASTROENTEROLOGY, [facility] [address]  [place] [address]  Operated by [facility]     Name: Hector [name] MRN:  [id number]   Date: 03/14/2024 Age: 63 y.o.     CHIEF COMPLAINT Follow Up and Barrett's Esophagus (Last visit 07/14/23, EGD 09/30/22, us  liver 10/14/23, colon 10/29/23, labs 02/29/24, KUB 01/08/24, patient is taking Protonix  daily, patient reports he is doing fine with no concerns at this time)     SUBJECTIVE:  Patient presents for follow up regarding cirrhosis, barretts esophagus, colonoscopy.     Cirrhosis history:  Current diuretics -none. Prior paracentesis -none.    Today, his Plavix  is on hold due to upcoming procedure related to bladder cancer after treatment since his last visit with us . He is losing weight, 35 pounds since last visit. At one point he was at [facility]. He suffered clots afterwards creating obstructive uropathy. He was then sent to [place] for surgery for cancer removal. He has had multiple issues related to this and is frustrated    PAST MEDICAL HISTORY:  Current Outpatient Medications   Medication Sig    albuterol  sulfate (VENTOLIN  HFA) 90 mcg/actuation Inhalation oral inhaler Take 2 Puffs by inhalation Every 6 hours as needed for Other (SHORTNESS OF BREATH,COUGH OR WHEEZING)    clopidogreL  (PLAVIX ) 75 mg Oral Tablet Take 1 Tablet (75 mg total) by mouth Daily (Patient not taking: Reported on 03/14/2024)    fluticasone  propion-salmeteroL (ADVAIR  HFA) 230-21 mcg/actuation Inhalation oral inhaler Take 2 Puffs by inhalation Twice daily RINSE MOUTH WITH WATER  AFTER INHALER USE    ipratropium-albuterol  0.5 mg-3 mg(2.5 mg base)/3 mL Solution for Nebulization TAKE 3 ML BY NEBULIZATION THREE TIMES A DAY AS NEEDED FOR WHEEZING    ketorolac  tromethamine (TORADOL ) 10 mg Oral Tablet Take 1 Tablet (10 mg total) by mouth Every 6 hours as needed for Pain for up to 15 doses    OLANZapine  (ZYPREXA ) 10 mg Oral Tablet Take 1 Tablet (10 mg total) by mouth Every night    oxygen   (O2) Inhalation gas Administer 4 L/min into affected nostril(s) continuous    pantoprazole  (PROTONIX ) 40 mg Oral Tablet, Delayed Release (E.C.) TAKE 1 TABLET (40 MG TOTAL) BY MOUTH TWICE A DAY 30 MINTUTES BEFORE MEALS FOR 90 DAYS    rosuvastatin  (CRESTOR ) 10 mg Oral Tablet TAKE 1 TABLET BY MOUTH EVERY DAY IN THE EVENING    sertraline  (ZOLOFT ) 100 mg Oral Tablet Take 1 Tablet (100 mg total) by mouth Daily Pm    tamsulosin  (FLOMAX ) 0.4 mg Oral Capsule Take 1 Capsule (0.4 mg total) by mouth Every evening after dinner    traZODone  (DESYREL ) 50 mg Oral Tablet Take 1 Tablet (50 mg total) by mouth Every night Unsure dosage     Past Medical History:   Diagnosis Date    Alcohol abuse     Bladder cancer     Cancer (CMS HCC)     Carotid stenosis     right side stent    Chronic obstructive airway disease     Cirrhosis     Closed fracture of transverse process of thoracic vertebra 05/01/2021    Coma     after fall due to alcholism May 2022    Delirium, withdrawal, alcoholic (CMS HCC)     Depression     Encephalopathy 05/15/2021    Esophageal reflux     H/O urinary tract infection     1/25    HTN (hypertension)     Hyperlipidemia  Ileus (CMS HCC) 05/18/2021    Left pulmonary contusion 05/01/2021    Leukopenia 05/18/2021    Multiple rib fractures 05/01/2021    Oxygen  dependent     Pleural effusion 05/11/2021    Serum ammonia increased (CMS HCC) 05/15/2021    Shortness of breath     Spleen injury 05/01/2021    Status post thoracentesis 05/19/2021    Thrombocytopenia (CMS HCC) 05/01/2021    Unknown cause of injury     fx nose    Wears glasses          Social History     Socioeconomic History    Marital status: Significant Other     Spouse name: vickie    Number of children: 2    Years of education: 12   Tobacco Use    Smoking status: Every Day     Current packs/day: 1.00     Average packs/day: 1 pack/day for 51.2 years (51.2 ttl pk-yrs)     Types: Cigarettes     Start date: 1974    Smokeless tobacco: Never    Tobacco comments:      THE PATIENT STARTED    Vaping Use    Vaping status: Never Used   Substance and Sexual Activity    Alcohol use: Not Currently     Alcohol/week: 15.0 standard drinks of alcohol     Types: 15 Cans of beer per week     Comment:  may 17th, 2022     Drug use: Yes     Types: Marijuana     Comment:  a joint or 2 a day     Sexual activity: Yes   Other Topics Concern    Ability to Walk 1 Flight of Steps without SOB/CP No    Routine Exercise No    Ability to Walk 2 Flight of Steps without SOB/CP No     Comment: SOB no CP    Ability To Do Own ADL's Yes    Uses Walker No    Uses Cane No     Social Determinants of Health     Financial Resource Strain: Low Risk  (01/28/2024)    Financial Resource Strain     SDOH Financial: No   Transportation Needs: Low Risk  (01/28/2024)    Transportation Needs     SDOH Transportation: No   Social Connections: Low Risk  (02/26/2024)    Social Connections     SDOH Social Isolation: 5 or more times a week   Intimate Partner Violence: Low Risk  (01/28/2024)    Intimate Partner Violence     SDOH Domestic Violence: No   Housing Stability: Low Risk  (01/28/2024)    Housing Stability     SDOH Housing Situation: I have housing.     SDOH Housing Worry: No        ROS: see HPI    OBJECTIVE:    BP (!) 142/82   Pulse 66   Ht 1.753 m (5\' 9" )   Wt 97.5 kg (215 lb)   SpO2 95%   BMI 31.75 kg/m     General: A&Ox3, no acute distress  HEENT: sclera anicteric  Heart: RRR, no murmurs  Lungs: clear, no wheezing or rhonchi  Abdomen:  soft, bowel sounds present x 4, non-tender, fluid wave not appreciated  Extremities: no lower extremity edema  Psych: no depression/anxiety    LABS:     AFP TUMOR MARKER  Lab Results   Component Value Date  ALPHFETOPROT 4 04/13/2023     PROTHROMBIN TIME and INR  Lab Results   Component Value Date    PROTHROMTME 12.4 02/27/2024    INR 1.11 02/27/2024     BASIC METABOLIC PANEL  Lab Results   Component Value Date    SODIUM 137 02/29/2024    POTASSIUM 4.3 02/29/2024    CHLORIDE 110  02/29/2024    CO2 23 02/29/2024    ANIONGAP 4 02/29/2024    BUN 12 02/29/2024    CREATININE 0.90 02/29/2024    BUNCRRATIO 13 02/29/2024    GFR >90 02/29/2024    CALCIUM 8.1 (L) 02/29/2024    GLUCOSE 103 02/29/2024    GLUCOSENF 111 (H) 02/27/2024      LIVER TESTS  Lab Results   Component Value Date    ALBUMIN  3.6 02/26/2024    AST 32 02/26/2024    ALT 24 02/26/2024    ALKPHOS 94 02/26/2024    TOTBILIRUBIN 0.9 02/26/2024    BILIRUBINCON 0.0 05/13/2021    GAMMAGT 376 (H) 05/13/2021    INR 1.11 02/27/2024     RADIOLOGY:  Recent Results (from the past [id number] hours)   US  LIVER    Collection Time: 10/14/23 10:43 AM    Narrative    Blaise Bumps LEE Bookbinder    RADIOLOGIST: Orpah Blackwater    US  LIVER performed on 10/14/2023 10:43 AM    CLINICAL HISTORY: K70.30: Alcoholic cirrhosis of liver without ascites (CMS HCC).      COMPARISON:  05/08/2023.    FINDINGS:  Liver: Cirrhotic liver morphology with coarse parenchyma and nodular surface contour. No focal lesion seen. Small caliber portal vein with reversal of portal flow.    Gallbladder:  Small amount of sludge versus small stone in the gallbladder neck. No evidence of acute cholecystitis.    Common bile duct:  Normal measuring  7.1 mm.    Pancreas: Visualized portions are sonographically unremarkable.    Visualized portions of the right kidney are unremarkable.  No right upper quadrant ascites.        Impression    1. Cirrhotic liver with portal hypertension evidenced by reversal of main portal flow. No right upper quadrant ascites.    2. Possible stone/sludge near the gallbladder neck. No evidence of acute cholecystitis or biliary ductal dilatation.        Radiologist location ID: [id number]     US  RT UPPER QUADRANT    Collection Time: 10/15/22 10:09 AM    Narrative    Blaise Bumps LEE Goon    RADIOLOGIST: Claudean Crumbly, MD    US  RT UPPER QUADRANT performed on 10/15/2022 10:09 AM    CLINICAL HISTORY: K70.30: Alcoholic cirrhosis, unspecified whether ascites present (CMS  HCC).  Cirrhosis.    COMPARISON:  None.    FINDINGS:  Liver: Nodular and course.    Gallbladder:  Multiple echogenic gallstones are identified.      Common bile duct:  Normal measuring  3.5 mm.    Pancreas: Obscured by bowel gas.    Visualized portions of the right kidney are unremarkable.  No right upper quadrant ascites.        Impression    THERE ARE FINDINGS CONSISTENT WITH HISTORY OF CIRRHOSIS. NO FOCAL ADENOPATHY IS IDENTIFIED.    CHOLELITHIASIS.        Radiologist location ID: [id number]         Problem List Items Addressed This Visit       Cirrhosis of liver without ascites (CMS HCC) - Primary (  Chronic)    Relevant Orders    ALPHA FETOPROTEIN (AFP) TUMOR MARKER    US  LIVER     Other Visit Diagnoses         Barrett's esophagus without dysplasia          History of colon polyps          Abdominal pain          Weight loss                 PLAN:  The patient was seen independently by myself with Dr. Rutha Coventry available for consultation.    cirrhosis  - etiology- alcohol  - MELD 3.0- 7 based on labs dated 02/26/2024  - ascites- not appreciated. Low salt diet. Avoid NSAIDs  - encephalopathy- none  - PHTN- last EGD 09/30/22.Will schedule. Will obtain clearance to hold plavix   - HCC screening- last imaging US  10/14/2023- no lesions. last AFP normal 04/13/2023. Repeat every 6 months. Ordered again  - immunizations- patient reports completing these through PCP     2. colon cancer surveillance  - done 08/26/21- fair prep and multiple polyps.   - patient is not interested in colonoscopy at this time      3. Barretts esophagus  - EGD 09/30/22- positive Barretts, no dysplasia  - continue Protonix  b.i.d. for now  - Tobacco cessation counseling performed > 3 minutes  -  plan for repeat EGD if able to hold plavix      4. abdominal pain x 3-4 days  - not related to bowel movements or oral intake  - worse with motion. Possibly muscle strain    5. weight loss  - unclear if this is related to newly diagnosed bladder cancer  - will  follow    Return to Clinic: Return in about 3 months (around 06/13/2024).    Aubrey Leaf, PA-C     DISCLAIMER: This note was partially created using voice recognition software which is subject to errors that may escape proof reading. Please pardon mistakes made by MModal. If typographical errors are noted and/or phrasing does not make sense, please contact me for corrections. I reserve the right to change note to correct mistakes related to MModal misinterpretation.

## 2024-03-14 NOTE — ED Triage Notes (Signed)
 Pt states waling yesterday feet gave way and fell hurting L shoulder, denies hitting head or any LOC,

## 2024-03-18 ENCOUNTER — Other Ambulatory Visit (HOSPITAL_BASED_OUTPATIENT_CLINIC_OR_DEPARTMENT_OTHER): Payer: Self-pay

## 2024-03-30 ENCOUNTER — Encounter (HOSPITAL_COMMUNITY)
Admission: RE | Disposition: A | Discharge status: Home / Self Care | Payer: Self-pay | Source: Ambulatory Visit | Attending: Student in an Organized Health Care Education/Training Program

## 2024-03-30 ENCOUNTER — Encounter (HOSPITAL_COMMUNITY): Payer: Self-pay

## 2024-03-30 ENCOUNTER — Ambulatory Visit
Admission: RE | Admit: 2024-03-30 | Discharge: 2024-03-30 | Disposition: A | Discharge status: Home / Self Care | Payer: MEDICAID | Source: Ambulatory Visit | Attending: Student in an Organized Health Care Education/Training Program | Admitting: Student in an Organized Health Care Education/Training Program

## 2024-03-30 ENCOUNTER — Other Ambulatory Visit: Payer: Self-pay

## 2024-03-30 ENCOUNTER — Encounter (HOSPITAL_COMMUNITY): Payer: Self-pay | Admitting: Student in an Organized Health Care Education/Training Program

## 2024-03-30 DIAGNOSIS — Z538 Procedure and treatment not carried out for other reasons: Secondary | ICD-10-CM | POA: Insufficient documentation

## 2024-03-30 DIAGNOSIS — C679 Malignant neoplasm of bladder, unspecified: Secondary | ICD-10-CM | POA: Insufficient documentation

## 2024-03-30 DIAGNOSIS — R8290 Unspecified abnormal findings in urine: Secondary | ICD-10-CM | POA: Insufficient documentation

## 2024-03-30 SURGERY — RESECTION TUMOR BLADDER TRANSURETHRAL
Anesthesia: General

## 2024-03-30 MED ORDER — SODIUM CHLORIDE 0.9 % (FLUSH) INJECTION SYRINGE
2.0000 mL | INJECTION | INTRAMUSCULAR | Status: DC | PRN
Start: 2024-03-30 — End: 2024-03-30

## 2024-03-30 MED ORDER — AMOXICILLIN 875 MG-POTASSIUM CLAVULANATE 125 MG TABLET
1.0000 | ORAL_TABLET | Freq: Two times a day (BID) | ORAL | 0 refills | Status: DC
Start: 2024-03-30 — End: 2024-05-02

## 2024-03-30 MED ORDER — LACTATED RINGERS INTRAVENOUS SOLUTION
INTRAVENOUS | Status: DC
Start: 2024-03-30 — End: 2024-03-30

## 2024-03-30 MED ORDER — DEXTROSE 5% IN WATER (D5W) FLUSH BAG - 250 ML
INTRAVENOUS | Status: DC | PRN
Start: 2024-03-30 — End: 2024-03-30

## 2024-03-30 MED ORDER — WATER FOR INJECTION, STERILE INJECTION SOLUTION
2.0000 g | Freq: Once | INTRAMUSCULAR | Status: DC
Start: 2024-03-30 — End: 2024-03-30
  Filled 2024-03-30: qty 20

## 2024-03-30 MED ORDER — SODIUM CHLORIDE 0.9 % (FLUSH) INJECTION SYRINGE
2.0000 mL | INJECTION | Freq: Three times a day (TID) | INTRAMUSCULAR | Status: DC
Start: 2024-03-30 — End: 2024-03-30

## 2024-03-30 MED ORDER — SODIUM CHLORIDE 0.9% FLUSH BAG - 250 ML
INTRAVENOUS | Status: DC | PRN
Start: 2024-03-30 — End: 2024-03-30

## 2024-03-30 SURGICAL SUPPLY — 18 items
ARMBRD POSITION 20X8X2IN DVN FOAM (IV TUBING & ACCESSORIES) IMPLANT
BAG DRAIN 4L URN ANTIREFLUX TWR STRL TVK METAL (UROLOGICAL SUPPLIES) IMPLANT
BAG DRAIN NONST LF  VNYL ACT 8MM DISP GE/OEC SYS (UROLOGICAL SUPPLIES) IMPLANT
BLANKET MISTRAL-AIR ADULT UPR BODY 79X29.9IN FRC AIR HI VOL BLWR INTUITIVE CONTROL PNL LRG LED (MED SURG SUPPLIES) IMPLANT
CYSTO PREP PACK ~~LOC~~ - RUBY MEMORIAL HOSPITAL (CUSTOM TRAYS & PACK) IMPLANT
DEVICE SECURE SNAPSECURE 360 SWVL CLIP BFUR FOLEY CATH ADH H FOAM STRL LF (UROLOGICAL SUPPLIES) IMPLANT
DEVICE SECURE STATLK BRTHBL ANCH PAD STAB FOLEY TRCT 3W CATH ADULT STRL LF  DISP (UROLOGICAL SUPPLIES) IMPLANT
GARMENT COMPRESS MED CALF CENTAURA NYL VASOGRAD LTWT BRTHBL SEQ FIL BLU 18- IN (MED SURG SUPPLIES) IMPLANT
GOWN SURG XL L3 NONREINFORCE HKLP CLSR SET IN SLEEVE STRL LF  DISP BLU SIRUS SMS 47IN (DRAPE/PACKS/SHEETS/OR TOWEL) IMPLANT
KIT RM TURNOVER STPC CUSTOM ALLIED CYSTO (CUSTOM TRAYS & PACK) IMPLANT
KIT RM TURNOVER STPC CUSTOM ALLIED XL GEN SURG (CUSTOM TRAYS & PACK) IMPLANT
MAT FLR 40X24IN ABS WTPRF BACKSHEET (MED SURG SUPPLIES) IMPLANT
SET 81IN REG CLAMP N-PYRG IRRG 10 GTT/ML STR CSCP DEHP BLADDER STRL LF (MED SURG SUPPLIES) IMPLANT
SOL IRRG 0.9% NACL 3L PRSV FR FLXB CONTAINR STRL LF (MEDICATIONS/SOLUTIONS) IMPLANT
SOL IRRG WATER STRL 2000ML PRSV FR FLXB CONTAINR LF (MED SURG SUPPLIES) IMPLANT
STRAP POSITION KNEE FOAM SFT ADJ CNTCT CLSR LF (MED SURG SUPPLIES) IMPLANT
SYRINGE 50ML LF  STRL GRAD N-PYRG DEHP-FR PVC FREE CATH TIP DISP CLR (MED SURG SUPPLIES) IMPLANT
TUBING SUCT CLR 20FT 9/32IN MEDIVAC NCDTV M/M CONN STRL LF (MED SURG SUPPLIES) IMPLANT

## 2024-03-30 NOTE — Anesthesia Preprocedure Evaluation (Signed)
 ANESTHESIA PRE-OP EVALUATION  Planned Procedure: RESECTION TUMOR BLADDER TRANSURETHRAL  Review of Systems     anesthesia history negative     patient summary reviewed  nursing notes reviewed        Pulmonary   COPD, shortness of breath and home oxygen,   Cardiovascular    Hypertension, murmur, CAD and hyperlipidemia ,No peripheral edema,        GI/Hepatic/Renal    GERD and liver disease        Endo/Other    Thrombocytopenia and blood dyscrasia,      Neuro/Psych/MS    depression, Substance use, alcohol, marijuana     Cancer    negative hematology/oncology ROS,                     Physical Assessment      Airway       Mallampati: II    TM distance: >3 FB    Neck ROM: full  Mouth Opening: good.  No Facial hair  No Beard        Dental           (+) poor dentition           Pulmonary    Breath sounds clear to auscultation  (-) no rhonchi, no decreased breath sounds, no wheezes, no rales and no stridor     Cardiovascular        (+) murmur present   (-) no friction rub, carotid bruit is not present and no peripheral edema     Other findings            Plan  ASA 4     Planned anesthesia type: general     general anesthesia with endotracheal tube intubation                        Anesthesia issues/risks discussed are: PONV, Sore Throat, Difficult Airway, Aspiration, Stroke and Cardiac Events/MI.  Anesthetic plan and risks discussed with patient and significant other  signed consent obtained          Patient's NPO status is appropriate for Anesthesia.           Plan discussed with CRNA.

## 2024-03-30 NOTE — OR PreOp (Signed)
 Pt positive for leukocytes and nitrites   pt cancelled   urine sent to lab for culture

## 2024-03-31 ENCOUNTER — Encounter (INDEPENDENT_AMBULATORY_CARE_PROVIDER_SITE_OTHER): Payer: Self-pay | Admitting: Student in an Organized Health Care Education/Training Program

## 2024-03-31 LAB — URINE CULTURE (PEDIATRIC/NEUTROPENIC/NEPHROLOGY): URINE CULTURE: 30000

## 2024-03-31 NOTE — H&P (Signed)
 Patient presented today for second-look TURBT. Found to have nitrite positive urine x 2 in preop. Also having some dysuria. Will reschedule procedure due to concern for UTI. 10 days Augmentin sent to preferred pharmacy. 3 days Augmentin also sent to be taken 3 days leading up to rescheduled procedure. Urine sent for culture. Will all if any change in abx is needed.    Hector [name]. Umberto Ganong, MD  Assistant Professor  [facility] Department of Urology  Pager [phone]  03/31/2024, 07:52

## 2024-04-01 ENCOUNTER — Telehealth (INDEPENDENT_AMBULATORY_CARE_PROVIDER_SITE_OTHER): Payer: Self-pay | Admitting: Student in an Organized Health Care Education/Training Program

## 2024-04-01 NOTE — Telephone Encounter (Signed)
 I called the patient to schedule them for a procedure with Dr. Mellissa Kohut. There was no answer. I will wait for patient's return call.

## 2024-04-05 ENCOUNTER — Encounter (HOSPITAL_COMMUNITY): Payer: Self-pay

## 2024-04-05 ENCOUNTER — Inpatient Hospital Stay (HOSPITAL_COMMUNITY)
Admission: RE | Admit: 2024-04-05 | Discharge: 2024-04-05 | Disposition: A | Discharge status: Home / Self Care | Payer: MEDICAID | Source: Ambulatory Visit

## 2024-04-05 HISTORY — DX: Cardiac murmur, unspecified: R01.1

## 2024-04-05 NOTE — Nurses Notes (Signed)
 [facility]    Preoperative Evaluation Center   Department of Anesthesiology      Name: Hector [name], Hector [name]   DOB: 1961/12/03    PRE-PROCEDURE/OPERATIVE INSTRUCTIONS   Preoperative Evaluation Center [facility])  [address] [place], [place] [address]    Thank you for choosing [facility] for your health care needs. [facility] is a tobacco-free campus. Please refrain from using any forms of tobacco while on the premises.     Please review upon receipt and again prior to procedure date.    If, after your PEC call or visit, you experience any of the changes below or develop any of the listed symptoms, Call the Preoperative Evaluation Center [facility]) at [phone].   Change in your best contact phone number.  Changes in your medications, especially starting a new medication.  Changes in your medical history, including an Emergency room visit, a hospital admission, or you receive a new diagnosis.    Develop any of these symptoms:  fever, cough, sore throat, shortness of breath, chills, muscle pain, new loss of taste or smell, vomiting or diarrhea, or fatigue (extreme tiredness or lack of energy).  Diagnosed with conjunctivitis (pink eye) or shingles.  Diagnosed as COVID positive.  Develop a wound, rash, open area or sores.  If you use or are prescribed an antibiotic.     Also contact your PCP to discuss your symptom(s) and the potential need for treatment/ appointments/etc.     PROCEDURE:    Procedure(s):  RESECTION TUMOR BLADDER TRANSURETHRAL    DATE of SURGERY:   Apr 20, 2024    ARRIVAL LOCATION:   1st Floor Registration     Arrival and diet instruction times will be given the business day prior to your procedure between 2 pm - 5 pm. If you should not hear from anyone by 5 pm the business day prior to your procedure, please call [phone] (Option 4).    Patients and visitors may park for free in the lots in front of the hospital. If you are need assistance from [facility] [place] parking lot, we have transportation available to  you.  Call security at [phone] to arrange pick up from the parking lot.    DIET INSTRUCTIONS:   STOP regular diet 8 hours before the arrival time of the surgery/procedure.   STOP clear liquids 3 hours before the time of surgery/procedure  then NPO (NPO means NO FOOD or DRINK).    ACCEPTABLE CLEAR LIQUIDS: Water , fruit juices (white grape or apple), pedialyte, gatorade, contrast dye (limited to non-particulate forms, such as gastrografin), coffee & tea without fat/milk/creamer, and clear broth without fat or protein.     LIQUIDS NOT ACCEPTED: Orange juice, any product colored red/blue/purple, infant formula, breast milk, coffee or tea with fat/milk/creamer, carbonated beverages or any alcoholic beverages.       Your diet instructions are specific for your safety. When not followed, particles left in your stomach could enter your lungs during surgery. Failure to follow the instructions could result in a delay or cancellation of your surgery.            Special Circumstances Diet Instruction: Drink 20oz electrolyte beverage up to 3 hours before scheduled surgery time. The electrolyte beverage must be completed by this time. (Gatorade, Powerade, or Pedialyte, any flavor but exclude colors red/blue/purple).    No tobacco (cigarettes, vaping, snuff, or chewing tobacco) or medical marijuana (all types) 8 hours prior to scheduled arrival time.  No Alcohol, or recreational drugs, including  marijuana, 24 hours prior to scheduled arrival time.  Chewing gum is permitted but do not swallow the gum as your surgery may be cancelled if swallowed.    MEDICATION INSTRUCTIONS:    Meds to take day of procedure with a sip of water : Inhalers       Meds to stop:     Plavix  7 days prior to procedure as per service recommendations    Avoid taking:   All NSAIDS (Ibuprofen/Motrin/Advil, Aleve/Naprosyn/Anaprox, Diclofenac/Voltaren, Meloxicam/Mobic, Ketorolac /Toradol ). Vitamin E, Multivitamins, Herbals, Fish Oil, Supplements, & other over  the counter meds for 7 days prior to your procedure, unless instructed otherwise. Tylenol  is safe to take up until and including the morning of surgery.    PREP:   Before surgery you can play an important role in your own health. Because skin is not sterile, we need to be sure that your skin is as free from germs as possible before surgery. You can help reduce the number of germs on your skin by carefully washing before surgery. To help make the process more effective, you will need to take 2 showers (one the evening before and one the morning of) using an antibacterial soap. Washing your hair (head) with normal Shampoo is acceptable. Dress in clean clothes after each shower. No shaving for 2 days prior to surgery to prevent any break in the skin. Once you have showered, do not apply deodorant, lotions, moisturizers, makeup, body spray, perfume, cologne, or aftershave unless instructed differently by your Surgeon or their Clinic.    TRANSPORTATION:  You must arrange for a responsible person, 18 or older, to drive you home and stay for 24 hours following discharge after the procedure. Public transportation may only be used in the event you still have the responsible person with you, drivers of the transportation are not responsible for your care.  If you have not arranged for a driver and responsible person to accompany you, your procedure will be cancelled.      If you use home oxygen , bring enough for travel to hospital and return trip home.     OTHER IMPORTANT INFORMATION:  Do not bring money, jewelry, or valuables with you.    Do not wear make-up, nail polish, lotion, powder, or aftershave. Do not wear any jewelry, watches, earrings, rings, or body piercings.    Piercings, including silicone, will be required to be removed in Pre-op area prior to surgery.  Remove your continuous glucose monitoring system prior to arrival as staff are required to check your glucose using facility monitors. Feel free to bring your  device with you as you can reapply it after surgery, upon discharge.    Bring Programmer for implantable devices (Deep Brain Stimulator or Nerve Stimulator for pain/mental health/sleep apnea, etc.) as they may be required to be turned off.  Minors must be accompanied by a parent or legal guardian. The responsible adult must stay with the patient before and after the procedure.  No visitors under age 22 are permitted in the preparation/recovery areas. It is strongly suggested that child-care arrangements be made for other children at home.  If child stays overnight, one parent or other adult family member is encouraged to stay during your child's hospitalization. Each room has a sleeper chair, couch, and shower, you are permitted to use. Siblings are not allowed to stay overnight. Rules are subject to change due to COVID guidelines.  Children may bring a special toy, doll, or blanket.  Wear glasses instead of contact lenses.  If possible, bring a case for your glasses.  It may be possible to wear your hearing aid throughout the procedure. Discuss this with the Pre op nursing staff.  Wear comfortable shoes and clean clothes to the hospital as you will wear them home after the procedure.  Bring any equipment - crutches, splints, etc., that you are already using at home.  Bring any legal paperwork with you (guardianship, custody, Medical Power of Attorney, Designer, industrial/product, etc) if applicable.  Before using the bathroom at the hospital, check with hospital staff for needed specimens.  You must have a valid photo ID to fill prescriptions for narcotics.  If discharged with prescriptions, we offer an in-house delivery.  Narcotics require in-person pick up with valid ID.  Other medications can be delivered to your bedside.    LODGING:    Both the [employer] and [facility] provide "homes away from home" for families of patients. If you, the patient, plan to stay, a responsible person 57 or older, is  required to stay with you for 24 hours following discharge, the same as if you are going home after discharge from facility. The [facility], located across the parking lot from the hospital provides lodging and supportive services to adult patients and their families. To be eligible, guests must live 50 miles or more from the [place] area and request a referral from a hospital staff person to be placed on the waiting list. [facility] staff and volunteers can assist you with hotel reservations, usually at a discounted rate, until a room becomes available. [phone]. The [employer], located to the right of [facility] provides temporary lodging to families of children receiving hospital treatment. [phone].    For more information on local motels and a list of private homes providing rental rooms, contact Social Services at [phone].    SPIRITUAL CARE:  [facility] has chaplains available every day for your spiritual needs. Our Interfaith Prayer and Meditation Room is located at [facility] on the first floor between the [employer] and elevators. You may ask your nurse or staff member to contact the on-call chaplain anytime.    For questions regarding instructions please contact PEC at [phone]. Leave a message if no answer.      CANCEL/PROCEDURE/SURGERY:  If you must cancel your procedure, call [phone] (Option 4) between 6 am and 5:30 pm. After 5:30 pm, call [facility] at [phone].

## 2024-04-06 ENCOUNTER — Ambulatory Visit (INDEPENDENT_AMBULATORY_CARE_PROVIDER_SITE_OTHER): Admitting: Orthopaedic Surgery

## 2024-04-06 ENCOUNTER — Other Ambulatory Visit (INDEPENDENT_AMBULATORY_CARE_PROVIDER_SITE_OTHER)

## 2024-04-06 DIAGNOSIS — G8929 Other chronic pain: Secondary | ICD-10-CM | POA: Diagnosis not present

## 2024-04-06 DIAGNOSIS — M25561 Pain in right knee: Secondary | ICD-10-CM | POA: Diagnosis not present

## 2024-04-06 NOTE — Progress Notes (Signed)
 Office Visit Note   Patient: Curtis Luna           Date of Birth: 1961/02/26           MRN: [id number] Visit Date: 04/06/2024              Requested by: Kaylee Partridge, MD [address] [place],  [place] [address] PCP: Kaylee Partridge, MD   Assessment & Plan: Visit Diagnoses:  1. Chronic pain of right knee     Plan: Mr. Musich is a 63 year old gentleman with right knee pain impression degenerative medial meniscal tear versus early symptoms of chondromalacia.  Not reporting any mechanical symptoms or swelling.  Treatment options discussed and he would like to start with some Voltaren gel and use a knee sleeve during activity and OTC medications as needed.  If symptoms do not improve we will consider cortisone injection.  Follow-Up Instructions: No follow-ups on file.   Orders:  Orders Placed This Encounter  Procedures   XR KNEE 3 VIEW RIGHT   No orders of the defined types were placed in this encounter.     Procedures: No procedures performed   Clinical Data: No additional findings.   Subjective: Chief Complaint  Patient presents with   Right Knee - Pain    HPI Mr. Karnes is a very pleasant 63 year old gentleman who is the father of Pauline Bos who comes in for evaluation of right knee pain since December.  Denies any preceding trauma.  Has pain mainly on the medial side of the knee.  He has start up pain that gets better with movement.  Denies any mechanical symptoms.  Activity helps the pain and immobilization makes it worse.  Currently not taking any medications.  Works as an Acupuncturist. Review of Systems  Constitutional: Negative.   HENT: Negative.    Eyes: Negative.   Respiratory: Negative.    Cardiovascular: Negative.   Gastrointestinal: Negative.   Endocrine: Negative.   Genitourinary: Negative.   Skin: Negative.   Allergic/Immunologic: Negative.   Neurological: Negative.   Hematological: Negative.    Psychiatric/Behavioral: Negative.    All other systems reviewed and are negative.    Objective: Vital Signs: There were no vitals taken for this visit.  Physical Exam Vitals and nursing note reviewed.  Constitutional:      Appearance: He is well-developed.  HENT:     Head: Normocephalic and atraumatic.  Eyes:     Pupils: Pupils are equal, round, and reactive to light.  Pulmonary:     Effort: Pulmonary effort is normal.  Abdominal:     Palpations: Abdomen is soft.  Musculoskeletal:        General: Normal range of motion.     Cervical back: Neck supple.  Skin:    General: Skin is warm.  Neurological:     Mental Status: He is alert and oriented to person, place, and time.  Psychiatric:        Behavior: Behavior normal.        Thought Content: Thought content normal.        Judgment: Judgment normal.     Ortho Exam Examination of the right knee shows no joint effusion or joint line tenderness.  Collaterals and cruciates are stable.  Normal range of motion. Specialty Comments:  No specialty comments available.  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   Seasonal affective disorder (HCC) 10/02/2014   Internal hemorrhoid 04/20/2014   Chronic low back pain 04/20/2014   Anxiety 04/20/2014   Esophageal reflux 04/04/2011   Diverticulosis of colon 04/04/2011   Past Medical History:  Diagnosis Date   Abdominal pain, right lower quadrant    Acute bronchospasm    Allergy     Anxiety    on and off   Backache, unspecified    Chicken pox    Diverticulosis of colon (without mention of hemorrhage)    Esophageal reflux    External hemorrhoids without mention of complication    thrombosed   Hiatal hernia    Hyperlipidemia    no medicines    Inguinal hernia without mention of obstruction or gangrene, unilateral or unspecified, (not specified as recurrent)    Migraine    Neuropathy    Obstructive sleep apnea (adult) (pediatric)    uses oral appliance   Other symptoms involving digestive system(787.99)    Pain in joint, forearm    Pain in joint, shoulder region    Seasonal allergies    Sleep apnea    no cpap but uses dental device    Thoracic or lumbosacral neuritis or radiculitis, unspecified     Family History  Problem Relation Age of Onset   Lung cancer Mother 12       Deceased   Other Father        MVA   Aneurysm Sister        Brain   Healthy Brother        x2   Heart disease Paternal Grandmother    Heart attack Paternal Grandmother    Allergies Daughter        x2   Migraines Daughter        x1   Diabetes Maternal Aunt    Neuropathy Other    Colon cancer Neg Hx    Colon polyps Neg Hx    Esophageal cancer Neg Hx    Rectal cancer Neg Hx    Stomach cancer Neg Hx    Pancreatic cancer Neg Hx    Allergic rhinitis Neg Hx    Angioedema Neg Hx    Asthma Neg Hx    Atopy Neg Hx    Eczema Neg Hx    Immunodeficiency Neg Hx    Urticaria Neg Hx     Past Surgical History:  Procedure Laterality Date   COLON RESECTION  10/06   sigmoid colon   COLONOSCOPY     INGUINAL HERNIA REPAIR  12/2012   Left   UPPER GASTROINTESTINAL ENDOSCOPY     WISDOM TOOTH EXTRACTION     Social History   Occupational History   Occupation: Community education officer   Tobacco Use   Smoking status: Former    Current packs/day: 0.00    Types: Cigarettes, Cigars    Quit date: 10/04/1987    Years since quitting: 36.5   Smokeless tobacco: Never  Vaping Use   Vaping status:  Never Used  Substance and Sexual Activity   Alcohol use: Yes    Alcohol/week:  14.0 standard drinks of alcohol    Types: 14 Standard drinks or equivalent per week    Comment: 2 daily    Drug use: No   Sexual activity: Not on file

## 2024-04-11 MED ORDER — GABAPENTIN 100 MG PO CAPS
100.0000 mg | ORAL_CAPSULE | Freq: Four times a day (QID) | ORAL | 1 refills | Status: DC
Start: 2024-04-11 — End: 2024-08-23

## 2024-04-11 NOTE — Addendum Note (Signed)
 Addended by: Abron Hoist on: 04/11/2024 01:47 PM   Modules accepted: Orders

## 2024-04-12 ENCOUNTER — Other Ambulatory Visit: Payer: Self-pay

## 2024-04-12 ENCOUNTER — Ambulatory Visit
Admission: RE | Admit: 2024-04-12 | Discharge: 2024-04-12 | Disposition: A | Discharge status: Home / Self Care | Payer: MEDICAID | Source: Ambulatory Visit | Attending: Medical | Admitting: Medical

## 2024-04-12 DIAGNOSIS — K7030 Alcoholic cirrhosis of liver without ascites: Secondary | ICD-10-CM | POA: Insufficient documentation

## 2024-04-14 ENCOUNTER — Other Ambulatory Visit (INDEPENDENT_AMBULATORY_CARE_PROVIDER_SITE_OTHER): Payer: Self-pay | Admitting: Nurse Practitioner

## 2024-04-14 DIAGNOSIS — J449 Chronic obstructive pulmonary disease, unspecified: Secondary | ICD-10-CM

## 2024-04-20 ENCOUNTER — Other Ambulatory Visit: Payer: Self-pay

## 2024-04-20 ENCOUNTER — Encounter (HOSPITAL_COMMUNITY): Payer: Self-pay | Admitting: Student in an Organized Health Care Education/Training Program

## 2024-04-20 ENCOUNTER — Encounter (HOSPITAL_COMMUNITY)
Admission: RE | Disposition: A | Discharge status: Home / Self Care | Payer: Self-pay | Source: Ambulatory Visit | Attending: Student in an Organized Health Care Education/Training Program

## 2024-04-20 ENCOUNTER — Ambulatory Visit (HOSPITAL_COMMUNITY): Payer: MEDICAID | Admitting: Certified Registered"

## 2024-04-20 ENCOUNTER — Ambulatory Visit
Admission: RE | Admit: 2024-04-20 | Discharge: 2024-04-20 | Disposition: A | Discharge status: Home / Self Care | Payer: MEDICAID | Source: Ambulatory Visit | Attending: Student in an Organized Health Care Education/Training Program | Admitting: Student in an Organized Health Care Education/Training Program

## 2024-04-20 DIAGNOSIS — Z8551 Personal history of malignant neoplasm of bladder: Secondary | ICD-10-CM | POA: Insufficient documentation

## 2024-04-20 DIAGNOSIS — R011 Cardiac murmur, unspecified: Secondary | ICD-10-CM | POA: Insufficient documentation

## 2024-04-20 DIAGNOSIS — F17210 Nicotine dependence, cigarettes, uncomplicated: Secondary | ICD-10-CM | POA: Insufficient documentation

## 2024-04-20 DIAGNOSIS — I10 Essential (primary) hypertension: Secondary | ICD-10-CM | POA: Insufficient documentation

## 2024-04-20 DIAGNOSIS — E785 Hyperlipidemia, unspecified: Secondary | ICD-10-CM | POA: Insufficient documentation

## 2024-04-20 DIAGNOSIS — K219 Gastro-esophageal reflux disease without esophagitis: Secondary | ICD-10-CM | POA: Insufficient documentation

## 2024-04-20 DIAGNOSIS — F32A Depression, unspecified: Secondary | ICD-10-CM | POA: Insufficient documentation

## 2024-04-20 DIAGNOSIS — I2510 Atherosclerotic heart disease of native coronary artery without angina pectoris: Secondary | ICD-10-CM | POA: Insufficient documentation

## 2024-04-20 DIAGNOSIS — C670 Malignant neoplasm of trigone of bladder: Secondary | ICD-10-CM

## 2024-04-20 DIAGNOSIS — C673 Malignant neoplasm of anterior wall of bladder: Secondary | ICD-10-CM

## 2024-04-20 DIAGNOSIS — C672 Malignant neoplasm of lateral wall of bladder: Secondary | ICD-10-CM | POA: Insufficient documentation

## 2024-04-20 DIAGNOSIS — D759 Disease of blood and blood-forming organs, unspecified: Secondary | ICD-10-CM | POA: Insufficient documentation

## 2024-04-20 DIAGNOSIS — D696 Thrombocytopenia, unspecified: Secondary | ICD-10-CM | POA: Insufficient documentation

## 2024-04-20 DIAGNOSIS — Z9981 Dependence on supplemental oxygen: Secondary | ICD-10-CM | POA: Insufficient documentation

## 2024-04-20 DIAGNOSIS — K7460 Unspecified cirrhosis of liver: Secondary | ICD-10-CM | POA: Insufficient documentation

## 2024-04-20 DIAGNOSIS — J449 Chronic obstructive pulmonary disease, unspecified: Secondary | ICD-10-CM | POA: Insufficient documentation

## 2024-04-20 SURGERY — RESECTION TUMOR BLADDER TRANSURETHRAL
Anesthesia: General | Wound class: Clean Contaminated Wounds-The respiratory, GI, Genital, or urinary

## 2024-04-20 MED ORDER — SODIUM CHLORIDE 0.9 % (FLUSH) INJECTION SYRINGE
2.0000 mL | INJECTION | Freq: Three times a day (TID) | INTRAMUSCULAR | Status: DC
Start: 2024-04-20 — End: 2024-04-20

## 2024-04-20 MED ORDER — LACTATED RINGERS INTRAVENOUS SOLUTION
INTRAVENOUS | Status: DC
Start: 2024-04-20 — End: 2024-04-20

## 2024-04-20 MED ORDER — FENTANYL (PF) 50 MCG/ML INJECTION SOLUTION
25.0000 ug | INTRAMUSCULAR | Status: DC | PRN
Start: 2024-04-20 — End: 2024-04-20

## 2024-04-20 MED ORDER — ROCURONIUM 10 MG/ML INTRAVENOUS SYRINGE WRAPPER
INJECTION | Freq: Once | INTRAVENOUS | Status: DC | PRN
Start: 2024-04-20 — End: 2024-04-20
  Administered 2024-04-20: 40 mg via INTRAVENOUS

## 2024-04-20 MED ORDER — LIDOCAINE 2 % MUCOSAL JELLY IN APPLICATOR
10.0000 mL | Freq: Once | Status: DC | PRN
Start: 2024-04-20 — End: 2024-04-20
  Filled 2024-04-20: qty 11

## 2024-04-20 MED ORDER — SODIUM CHLORIDE 0.9% FLUSH BAG - 250 ML
INTRAVENOUS | Status: DC | PRN
Start: 2024-04-20 — End: 2024-04-20

## 2024-04-20 MED ORDER — DEXAMETHASONE SODIUM PHOSPHATE 4 MG/ML INJECTION SOLUTION
Freq: Once | INTRAMUSCULAR | Status: DC | PRN
Start: 2024-04-20 — End: 2024-04-20
  Administered 2024-04-20: 8 mg via INTRAVENOUS

## 2024-04-20 MED ORDER — LIDOCAINE (PF) 100 MG/5 ML (2 %) INTRAVENOUS SYRINGE
INJECTION | Freq: Once | INTRAVENOUS | Status: DC | PRN
Start: 2024-04-20 — End: 2024-04-20
  Administered 2024-04-20: 80 mg via INTRAVENOUS

## 2024-04-20 MED ORDER — DROPERIDOL 2.5 MG/ML INJECTION SOLUTION
0.6250 mg | Freq: Once | INTRAMUSCULAR | Status: DC | PRN
Start: 2024-04-20 — End: 2024-04-20

## 2024-04-20 MED ORDER — SODIUM CHLORIDE 0.9 % INTRAVENOUS SOLUTION
INTRAVENOUS | Status: DC | PRN
Start: 2024-04-20 — End: 2024-04-20

## 2024-04-20 MED ORDER — FENTANYL (PF) 50 MCG/ML INJECTION SOLUTION
12.5000 ug | INTRAMUSCULAR | Status: DC | PRN
Start: 2024-04-20 — End: 2024-04-20

## 2024-04-20 MED ORDER — ONDANSETRON HCL (PF) 4 MG/2 ML INJECTION SOLUTION
Freq: Once | INTRAMUSCULAR | Status: DC | PRN
Start: 2024-04-20 — End: 2024-04-20
  Administered 2024-04-20: 4 mg via INTRAVENOUS

## 2024-04-20 MED ORDER — SODIUM CHLORIDE 0.9 % (FLUSH) INJECTION SYRINGE
2.0000 mL | INJECTION | INTRAMUSCULAR | Status: DC | PRN
Start: 2024-04-20 — End: 2024-04-20

## 2024-04-20 MED ORDER — FENTANYL (PF) 50 MCG/ML INJECTION SOLUTION
INTRAMUSCULAR | Status: AC
Start: 2024-04-20 — End: 2024-04-20
  Filled 2024-04-20: qty 2

## 2024-04-20 MED ORDER — OXYBUTYNIN CHLORIDE ER 10 MG TABLET,EXTENDED RELEASE 24 HR
10.0000 mg | EXTENDED_RELEASE_TABLET | Freq: Every day | ORAL | 0 refills | Status: AC
Start: 2024-04-20 — End: 2024-04-25
  Filled 2024-04-20: qty 5, 5d supply, fill #0

## 2024-04-20 MED ORDER — MIDAZOLAM (PF) 1 MG/ML INJECTION SOLUTION
Freq: Once | INTRAMUSCULAR | Status: DC | PRN
Start: 2024-04-20 — End: 2024-04-20
  Administered 2024-04-20: 1 mg via INTRAVENOUS

## 2024-04-20 MED ORDER — AMOXICILLIN 875 MG-POTASSIUM CLAVULANATE 125 MG TABLET
1.0000 | ORAL_TABLET | Freq: Two times a day (BID) | ORAL | 0 refills | Status: DC
Start: 2024-04-20 — End: 2024-05-02
  Filled 2024-04-20: qty 10, 5d supply, fill #0

## 2024-04-20 MED ORDER — SODIUM CHLORIDE 0.9 % INTRAVENOUS PIGGYBACK
3.0000 g | Freq: Once | INTRAVENOUS | Status: AC
Start: 2024-04-20 — End: 2024-04-20
  Administered 2024-04-20: 3 g via INTRAVENOUS
  Filled 2024-04-20: qty 8

## 2024-04-20 MED ORDER — FENTANYL (PF) 50 MCG/ML INJECTION SOLUTION
Freq: Once | INTRAMUSCULAR | Status: DC | PRN
Start: 2024-04-20 — End: 2024-04-20
  Administered 2024-04-20 (×4): 50 ug via INTRAVENOUS

## 2024-04-20 MED ORDER — ONDANSETRON HCL (PF) 4 MG/2 ML INJECTION SOLUTION
4.0000 mg | Freq: Once | INTRAMUSCULAR | Status: DC | PRN
Start: 2024-04-20 — End: 2024-04-20

## 2024-04-20 MED ORDER — PROPOFOL 10 MG/ML IV BOLUS
INJECTION | Freq: Once | INTRAVENOUS | Status: DC | PRN
Start: 2024-04-20 — End: 2024-04-20
  Administered 2024-04-20: 50 mg via INTRAVENOUS
  Administered 2024-04-20: 25 mg via INTRAVENOUS
  Administered 2024-04-20: 150 mg via INTRAVENOUS

## 2024-04-20 MED ORDER — DEXTROSE 5% IN WATER (D5W) FLUSH BAG - 250 ML
INTRAVENOUS | Status: DC | PRN
Start: 2024-04-20 — End: 2024-04-20

## 2024-04-20 MED ORDER — DEXAMETHASONE SODIUM PHOSPHATE 4 MG/ML INJECTION SOLUTION
INTRAMUSCULAR | Status: AC
Start: 2024-04-20 — End: 2024-04-20
  Filled 2024-04-20: qty 1

## 2024-04-20 MED ORDER — SUGAMMADEX 100 MG/ML INTRAVENOUS SOLUTION
INTRAVENOUS | Status: AC
Start: 2024-04-20 — End: 2024-04-20
  Filled 2024-04-20: qty 2

## 2024-04-20 MED ORDER — ROCURONIUM 10 MG/ML INTRAVENOUS SYRINGE WRAPPER
INJECTION | INTRAVENOUS | Status: AC
Start: 2024-04-20 — End: 2024-04-20
  Filled 2024-04-20: qty 5

## 2024-04-20 MED ORDER — SUGAMMADEX 100 MG/ML INTRAVENOUS SOLUTION
Freq: Once | INTRAVENOUS | Status: DC | PRN
Start: 2024-04-20 — End: 2024-04-20
  Administered 2024-04-20: 200 mg via INTRAVENOUS

## 2024-04-20 MED ORDER — OXYCODONE 5 MG TABLET
5.0000 mg | ORAL_TABLET | Freq: Once | ORAL | Status: DC | PRN
Start: 2024-04-20 — End: 2024-04-20

## 2024-04-20 MED ORDER — KETOROLAC 10 MG TABLET
10.0000 mg | ORAL_TABLET | Freq: Four times a day (QID) | ORAL | 0 refills | Status: DC | PRN
Start: 2024-04-20 — End: 2024-08-10
  Filled 2024-04-20: qty 10, 3d supply, fill #0

## 2024-04-20 MED ORDER — MIDAZOLAM 1 MG/ML INJECTION SOLUTION
INTRAMUSCULAR | Status: AC
Start: 2024-04-20 — End: 2024-04-20
  Filled 2024-04-20: qty 2

## 2024-04-20 MED ORDER — ONDANSETRON HCL (PF) 4 MG/2 ML INJECTION SOLUTION
INTRAMUSCULAR | Status: AC
Start: 2024-04-20 — End: 2024-04-20
  Filled 2024-04-20: qty 2

## 2024-04-20 SURGICAL SUPPLY — 15 items
ARMBRD POSITION 20X8X2IN DVN FOAM (MED SURG SUPPLIES) ×1 IMPLANT
BAG DRAIN 4L URN ANTIREFLUX TWR STRL TVK METAL (UROLOGICAL SUPPLIES) ×1 IMPLANT
BAG DRAIN NONST LF  VNYL ACT 8MM DISP GE/OEC SYS (UROLOGICAL SUPPLIES) ×1 IMPLANT
BLANKET MISTRAL-AIR ADULT UPR BODY 79X29.9IN FRC AIR HI VOL BLWR INTUITIVE CONTROL PNL LRG LED (MED SURG SUPPLIES) ×1 IMPLANT
CATH URETH DOVER 18FR FOLEY 2W LRG SMOOTH DRAIN EYE FIRM TIP SIL 10ML STRL LF  BLU STRP CLR (UROLOGICAL SUPPLIES) ×1 IMPLANT
CUSTOM CYSTO PREP PACK ~~LOC~~ - RUBY MEMORIAL HOSPITAL (CUSTOM TRAYS & PACK) ×1 IMPLANT
CYSTO PREP PACK ~~LOC~~ - RUBY MEMORIAL HOSPITAL (CUSTOM TRAYS & PACK) ×1 IMPLANT
DEVICE SECURE SNAPSECURE 360 SWVL CLIP BFUR FOLEY CATH ADH H FOAM STRL LF (UROLOGICAL SUPPLIES) ×1 IMPLANT
ELECTRODE ESURG 12D LOOP MED LONG 24FR PLASMALOOP STRL DISP HFRQ RESCT LF (SURGICAL CUTTING SUPPLIES) ×1 IMPLANT
GARMENT COMPRESS MED CALF CENTAURA NYL VASOGRAD LTWT BRTHBL SEQ FIL BLU 18- IN (MED SURG SUPPLIES) ×1 IMPLANT
GOWN SURG XL L3 NONREINFORCE HKLP CLSR SET IN SLEEVE STRL LF  DISP BLU SIRUS SMS 47IN (DRAPE/PACKS/SHEETS/OR TOWEL) ×1 IMPLANT
KIT RM TURNOVER STPC CUSTOM ALLIED CYSTO (CUSTOM TRAYS & PACK) ×1 IMPLANT
MAT FLR 40X24IN ABS WTPRF BACKSHEET (MED SURG SUPPLIES) ×1 IMPLANT
SOL IRRG 0.9% NACL 3L PRSV FR FLXB CONTAINR STRL LF (MEDICATIONS/SOLUTIONS) ×4 IMPLANT
TUBING SUCT CLR 20FT 9/32IN MEDIVAC NCDTV M/M CONN STRL LF (MED SURG SUPPLIES) ×1 IMPLANT

## 2024-04-20 NOTE — Anesthesia Preprocedure Evaluation (Addendum)
 ANESTHESIA PRE-OP EVALUATION  Planned Procedure: RESECTION TUMOR BLADDER TRANSURETHRAL  Review of Systems     anesthesia history negative     patient summary reviewed  nursing notes reviewed        Pulmonary   COPD, shortness of breath and home oxygen ,   Cardiovascular    Hypertension, murmur, CAD and hyperlipidemia ,No peripheral edema,        GI/Hepatic/Renal    GERD and liver disease        Endo/Other    Thrombocytopenia and blood dyscrasia,      Neuro/Psych/MS    depression, Substance use, alcohol, marijuana     Cancer  CA,                   Physical Assessment      Airway       Mallampati: II    TM distance: >3 FB    Neck ROM: full  Mouth Opening: good.  No Facial hair  No Beard        Dental           (+) poor dentition, missing             Pulmonary    Breath sounds clear to auscultation  (-) no rhonchi, no decreased breath sounds, no wheezes, no rales and no stridor     Cardiovascular        (+) murmur present   (-) no friction rub, carotid bruit is not present and no peripheral edema     Other findings          Plan  ASA 4     Planned anesthesia type: general     general anesthesia with endotracheal tube intubation      PONV Plan:  I plan to administer pharmcologic prophalaxis antiemetics  POV PLAN:   plan for postoperative opioid use            Intravenous induction     Anesthesia issues/risks discussed are: PONV, Sore Throat, Difficult Airway, Aspiration, Stroke, Cardiac Events/MI, Intraoperative Awareness/ Recall and Post-op Agitation/Tantrum.  Anesthetic plan and risks discussed with patient and significant other  signed consent obtained          Patient's NPO status is appropriate for Anesthesia.           Plan discussed with CRNA and attending.

## 2024-04-20 NOTE — Progress Notes (Signed)
 [facility]  Department of Surgery  Discharge Day Note    Hector [name]  Date of service: 04/20/2024  Date of Admission:  04/20/2024  Hospital Day:  LOS: 0 days     Examination at discharge: stable, at baseline  Vital Signs: Temperature: 36.4 C (97.5 F) (04/20/24 1413)  Heart Rate: 91 (04/20/24 1413)  BP (Non-Invasive): (!) 164/59 (04/20/24 1413)  Respiratory Rate: 19 (04/20/24 1413)  SpO2: 95 % (04/20/24 1413)  General: No distress    Assessment:   63 y.o. male s/p TURBT    Disposition/Plan :   -Home discharge    -Follow up in 2 weeks for path via telephone  -5 days Augmentin , remove Foley at home in 5 days      Rogers Clayman, MD  04/20/2024, 14:14      I saw and examined the patient.  I reviewed the resident's note.   I agree with the findings and plan of care as documented in the resident's note.   Any exceptions/additions are edited/noted.    Everlyn Hockey. Umberto Ganong, MD  Assistant Professor  [facility] Department of Urology  Pager [phone]  04/20/2024, 14:24

## 2024-04-20 NOTE — H&P (Signed)
 [place]  [facility]  Department of Urologic Surgery   Pre-operative History and Physical    Hector [name]  [id number]  05/16/1961    CC: High risk bladder cancer    HPI: Hector [name] is a 63 y.o. male with a history of above who presents for second look TURBT. The patient's last cystoscopy was performed on 02/24/2024. No new issues.     PMHx:   Past Medical History:   Diagnosis Date    Alcohol abuse     Bladder cancer     Cancer (CMS [facility])     Carotid stenosis     right side stent    Chronic obstructive airway disease     Cirrhosis     Closed fracture of transverse process of thoracic vertebra 05/01/2021    Coma     after fall due to alcholism May 2022    Delirium, withdrawal, alcoholic (CMS HCC)     Depression     Encephalopathy 05/15/2021    Esophageal reflux     H/O urinary tract infection     1/25    Heart murmur     HTN (hypertension)     Hyperlipidemia     Ileus (CMS HCC) 05/18/2021    Left pulmonary contusion 05/01/2021    Leukopenia 05/18/2021    Multiple rib fractures 05/01/2021    Oxygen  dependent     Pleural effusion 05/11/2021    Serum ammonia increased (CMS HCC) 05/15/2021    Shortness of breath     Spleen injury 05/01/2021    Status post thoracentesis 05/19/2021    Thrombocytopenia (CMS HCC) 05/01/2021    Unknown cause of injury     fx nose          PSHx:   Past Surgical History:   Procedure Laterality Date    CAROTID STENT      COLONOSCOPY  08/2021    ESOPHAGOGASTRODUODENOSCOPY  08/2021    HX OTHER Right 06/29/2023    TCAR    HX TONSILLECTOMY      OTHER SURGICAL HISTORY      HX of plastic surgery to right head         Family Hx: Aaron Aas  Family Medical History:       Problem Relation (Age of Onset)    Asthma Father    Cerebral Aneurysm Sister    Dementia Mother    Heart Attack Paternal Grandmother (49)            Social Hx:   Social History     Socioeconomic History    Marital status: Single     Spouse name: vickie    Number of children: 2    Years of education: 12    Highest education level:  Not on file   Occupational History    Not on file   Tobacco Use    Smoking status: Every Day     Current packs/day: 1.00     Average packs/day: 1 pack/day for 51.3 years (51.3 ttl pk-yrs)     Types: Cigarettes     Start date: 1974    Smokeless tobacco: Never   Vaping Use    Vaping status: Never Used   Substance and Sexual Activity    Alcohol use: Not Currently     Alcohol/week: 15.0 standard drinks of alcohol     Types: 15 Cans of beer per week     Comment:  may 17th, 2022     Drug use: Yes  Types: Marijuana     Comment:  a joint or 2 a day     Sexual activity: Yes   Other Topics Concern    Ability to Walk 1 Flight of Steps without SOB/CP Yes    Routine Exercise Not Asked    Ability to Walk 2 Flight of Steps without SOB/CP No     Comment: SOB & CP    Unable to Ambulate Not Asked    Total Care Not Asked    Ability To Do Own ADL's Yes    Uses Walker Not Asked    Other Activity Level Not Asked    Uses Cane Not Asked   Social History Narrative    Not on file     Social Determinants of Health     Financial Resource Strain: Low Risk  (01/28/2024)    Financial Resource Strain     SDOH Financial: No   Transportation Needs: Low Risk  (01/28/2024)    Transportation Needs     SDOH Transportation: No   Social Connections: Low Risk  (02/26/2024)    Social Connections     SDOH Social Isolation: 5 or more times a week   Intimate Partner Violence: Low Risk  (01/28/2024)    Intimate Partner Violence     SDOH Domestic Violence: No   Housing Stability: Low Risk  (01/28/2024)    Housing Stability     SDOH Housing Situation: I have housing.     SDOH Housing Worry: No     Medications:   No current outpatient medications on file.     Allergies:   Allergies   Allergen Reactions    Bee Venom Protein (Honey Bee)      Bee stings       ROS:  All 10 systems were reviewed.  There were pertinent positives in: Genital urinary system  There are no new changes to the prior reviewed review of systems.    Physical Exam:  Ht 1.727 m (5\' 8" )   Wt 97.1 kg  (214 lb 1.1 oz)   BMI 32.55 kg/m       General - Alert/oriented; NAD  Neck - Supple, trachea midline  Lungs - Respirations are unlabored, good inspiratory effort  Abdomen - Non-distended   Musculoskeletal - 4 extremities intact  Neurological - CN II-XII Grossly intact  GU system - Deferred. Plan to examine in OR    Assessment:  63 y.o. male with:     High risk bladder cancer    Plan:  To OR for TURBT   Consent obtained  The patient understands the risks of the procedure, which include bleeding, infection, recurrence, damage to the surrounding structures, failure to correct pathology, loss of sensation, and the risks of anesthesia (heart attack, stroke, death, etc.)    All questions of patient and family answered    Rogers Clayman, MD      I saw and examined the patient.  I reviewed the resident's note.   I agree with the findings and plan of care as documented in the resident's note.   Any exceptions/additions are edited/noted.    Everlyn Hockey. Umberto Ganong, MD  Assistant Professor  [facility] Department of Urology  Pager [phone]  04/20/2024, 11:57

## 2024-04-20 NOTE — Anesthesia Transfer of Care (Signed)
 ANESTHESIA TRANSFER OF CARE   Hector [name] is a 63 y.o. ,male, Weight: 97.1 kg (214 lb 1.1 oz)   had Procedure(s):  RESECTION TUMOR BLADDER TRANSURETHRAL  performed  04/20/24   Primary Service: Ulyess Gammons, *    Past Medical History:   Diagnosis Date    Alcohol abuse     Bladder cancer     Cancer (CMS [facility])     Carotid stenosis     right side stent    Chronic obstructive airway disease     Cirrhosis     Closed fracture of transverse process of thoracic vertebra 05/01/2021    Coma     after fall due to alcholism May 2022    Delirium, withdrawal, alcoholic (CMS HCC)     Depression     Encephalopathy 05/15/2021    Esophageal reflux     H/O urinary tract infection     1/25    Heart murmur     HTN (hypertension)     Hyperlipidemia     Ileus (CMS HCC) 05/18/2021    Left pulmonary contusion 05/01/2021    Leukopenia 05/18/2021    Multiple rib fractures 05/01/2021    Oxygen  dependent     Pleural effusion 05/11/2021    Serum ammonia increased (CMS HCC) 05/15/2021    Shortness of breath     Spleen injury 05/01/2021    Status post thoracentesis 05/19/2021    Thrombocytopenia (CMS HCC) 05/01/2021    Unknown cause of injury     fx nose      Allergy History as of 04/20/24       BEE VENOM PROTEIN (HONEY BEE)         Noted Status Severity Type Reaction    05/01/21 1124 Aura Bloomer, LPN 81/19/14 Active       Comments: Bee stings                   I completed my transfer of care / handoff to the receiving personnel during which we discussed:  Access, Airway, All key/critical aspects of case discussed, Analgesia, Antibiotics, Expectation of post procedure, Fluids/Product, Gave opportunity for questions and acknowledgement of understanding, Labs and PMHx                              Additional Info:RN verb comfort with pt condition. IV clear of propofol                                      Last OR Temp: Temperature: 36.1 C (97 F)  ABG:  PH (ARTERIAL)   Date Value Ref Range Status   05/19/2021 7.43 7.35 - 7.45 Final     PH  (T)   Date Value Ref Range Status   05/19/2021 7.42 7.35 - 7.45 Final     PCO2 (ARTERIAL)   Date Value Ref Range Status   05/19/2021 29 (L) 35 - 45 mm/Hg Final     PO2 (ARTERIAL)   Date Value Ref Range Status   05/19/2021 70 (L) 72 - 100 mm/Hg Final     SODIUM   Date Value Ref Range Status   05/19/2021 142 137 - 145 mmol/L Final     POTASSIUM   Date Value Ref Range Status   02/29/2024 4.3 3.5 - 5.1 mmol/L Final     KETONES   Date Value  Ref Range Status   01/15/2024 Trace (A) Negative mg/dL Final     WHOLE BLOOD POTASSIUM   Date Value Ref Range Status   05/19/2021 4.3 3.5 - 4.6 mmol/L Final     CHLORIDE   Date Value Ref Range Status   05/19/2021 115 (H) 101 - 111 mmol/L Final     CALCIUM   Date Value Ref Range Status   02/29/2024 8.1 (L) 8.6 - 10.3 mg/dL Final     Comment:     Gadolinium-containing contrast can interfere with calcium measurement.       Calculated P Axis   Date Value Ref Range Status   01/05/2024 65 degrees Final     Calculated R Axis   Date Value Ref Range Status   01/05/2024 55 degrees Final     Calculated T Axis   Date Value Ref Range Status   01/05/2024 49 degrees Final     IONIZED CALCIUM   Date Value Ref Range Status   05/19/2021 1.22 1.10 - 1.35 mmol/L Final     LACTATE   Date Value Ref Range Status   05/19/2021 0.8 0.0 - 1.3 mmol/L Final     HEMOGLOBIN   Date Value Ref Range Status   05/19/2021 10.3 (L) 12.0 - 18.0 g/dL Final     OXYHEMOGLOBIN   Date Value Ref Range Status   05/19/2021 92.8 85.0 - 98.0 % Final     CARBOXYHEMOGLOBIN   Date Value Ref Range Status   05/19/2021 2.1 0.0 - 2.5 % Final     MET-HEMOGLOBIN   Date Value Ref Range Status   05/19/2021 1.3 0.0 - 2.0 % Final     BASE EXCESS (ARTERIAL)   Date Value Ref Range Status   05/17/2021 0.1 0.0 - 1.0 mmol/L Final     BASE DEFICIT   Date Value Ref Range Status   05/19/2021 4.2 (H) 0.0 - 3.0 mmol/L Final     BICARBONATE (ARTERIAL)   Date Value Ref Range Status   05/19/2021 21.6 18.0 - 26.0 mmol/L Final     TEMPERATURE, COMP   Date  Value Ref Range Status   05/19/2021 37.0   C Final     Airway:* No LDAs found *  Blood pressure (!) 139/57, pulse 65, temperature 36.1 C (97 F), resp. rate 20, height 1.727 m (5\' 8" ), weight 97.1 kg (214 lb 1.1 oz), SpO2 93%.

## 2024-04-20 NOTE — OR Surgeon (Signed)
 [facility]   DEPARTMENT OF UROLOGY   OPERATION SUMMARY     PATIENT NAME: Hector [name] NUMBER: [id number]   DATE OF SERVICE: 04/20/2024   DATE OF BIRTH: [birth year]     PREOPERATIVE DIAGNOSIS:   1. High risk bladder cancer s/p TURBT 02/2024    POSTOPERATIVE DIAGNOSIS: Same; there was small volume papillary erythema around the circumference of prior resection site on the trigone, in addition there were papillary tumors over the right lateral wall measuring 2 cm and left side of the anterior wall measuring 2 cm    NAME OF PROCEDURE:    1. Rigid cystourethroscopy   2. Bipolar transurethral resection of bladder tumor (TURBT Large > 5 cm)  3. Fulguration of resection bed   4. MD Foley catheter placement    FINDINGS:  1. There was small volume papillary erythema around the circumference of prior resection site on the trigone, in addition there were papillary tumors over the right lateral wall measuring 2 cm and left side of the anterior wall measuring 2 cm.  2. Both ureteral orifices were orthotopic and widely patent with good efflux.          Assessment of Prognostic Factors    1. Describe number of tumors 2-5   2. Describe size of largest tumor 2-3 cm   3. Describe characteristics of tumors Papillary   4. Describe recurrent vs. primary tumors Recurrent   5. Assess for presence of cis Not suspicious    6. Report 2010 AJCC clinical tumor stage Pending pathology   Intraoperative Processes     7. Bimanual exam under anesthesia N/A for mobile or fixed 3D mass   8. Visually complete resection Yes   9. Visualization of detrusor muscle in specimen Yes   10. Visual evaluation for perforation No bladder perforation was visualized   Optional: Separate deep biopsy sent from resection bed No   Chuckie Craven, et al. A 10-Item Checklist Reporting of Clinical Procedural Elements during Transurethral Resection of Bladder Tumor. J of Urol. 2016; 196, 1014-1020.    SURGEON(S): Ulyess Gammons,  MD  RESIDENT(S): Rogers Clayman, MD, Vincenza Greener, MD    ESTIMATED BLOOD LOSS: Minimal   COMPLICATIONS: None  ANESTHESIA: General anesthesia    SPECIMENS:  Trigone resection scar, right lateral wall tumor, anterior wall tumor  TUBES: 18 Fr Foley catheter     INDICATIONS FOR PROCEDURE: Hector [name] is a 63 y.o. male with a history of above who presents for second look TURBT. The patient's last cystoscopy was performed on 02/24/2024. No new issues.     DESCRIPTION OF PROCEDURE: The patient was consented preoperatively. All risks and benefits of the procedure were explained which include, damage to the ureter, urethra, bladder which could require future and/or emergent surgery to fix, ureteral or urethral stricture, bleeding, infection, bladder perforation requiring prolonged catheterization and/or surgery, failure to resect all of the tumor, and need for another surgery.  Complications from anesthesia which include heart attack, stroke, blood clot, pulmonary embolus, prolonged mechanical ventilation, and rare instances death. There are also risks of nerve damage from patient positioning.     Patient was then brought back to the operative suite and placed on the operating table. General anesthesia was induced. The patient was placed in dorsal lithotomy position with pressure points well padded. Genitalia were prepped and draped in usual sterile fashion. A surgical timeout was performed identifying the correct patient, date of birth, medical record  number, surgical site, allergies, and surgical procedure.     Using a 22-French sheath and 30-degrees lens, we inserted the cystoscope into the urethra and into the bladder under direct visualization. Once inside the bladder, panendoscopic views of the bladder were obtained including anterior lateral walls, floor, trigone and dome. There was small volume papillary erythema around the circumference of prior resection site on the trigone, in addition there were papillary tumors  over the right lateral wall measuring 2 cm and left side of the anterior wall measuring 2 cm. We then exchanged the cystoscope for a 27 Fr Bipolar resectoscope with the Medium Loop. The bladder tumor was resected until muscle fibers were present. Spot coagulation was used for hemostasis.  At this point the resectoscope was removed and a 18 Fr foley catheter was inserted and the balloon was inflated with 10 mL of sterile normal saline. The bladder was emptied and the procedure was terminated. The patient tolerated the procedure well with no complications. Dr. Ulyess Gammons was present and participated in the entire procedure.     DISPOSITION: The patient will be discharged home.  He was given preoperative antibiotics.  He will be discharged with pain medication and a course of PO antibiotics. He will follow up in 2 weeks to review pathology.     Rogers Clayman, MD   [place]  [facility]  Division of Urology    04/20/2024, 14:15         I was present and participated in all aspects of the patient's case.   I reviewed the resident's note.   I agree with the findings and plan of care as documented in the resident's note.   Any exceptions/additions are edited/noted.    Everlyn Hockey. Umberto Ganong, MD  Assistant Professor  [facility] Department of Urology  Pager [phone]  04/20/2024, 14:26

## 2024-04-20 NOTE — Discharge Instructions (Signed)
SURGICAL DISCHARGE INSTRUCTIONS     Dr. McClelland, Daniel, MD  performed your RESECTION TUMOR BLADDER TRANSURETHRAL today at the [facility]    [facility]:  Monday through Friday from 6 a.m. - 7 p.m.: [phone]  Between 7 p.m. - 6 a.m., weekends and holidays:  Call [facility] at [phone] or [phone].    PLEASE SEE WRITTEN HANDOUTS AS DISCUSSED BY YOUR NURSE:      SIGNS AND SYMPTOMS OF A WOUND / INCISION INFECTION   Be sure to watch for the following:  Increase in redness or red streaks near or around the wound or incision.  Increase in pain that is intense or severe and cannot be relieved by the pain medication that your doctor has given you.  Increase in swelling that cannot be relieved by elevation of a body part, or by applying ice, if permitted.  Increase in drainage, or if yellow / green in color and smells bad. This could be on a dressing or a cast.  Increase in fever for longer than 24 hours, or an increase that is higher than 101 degrees Fahrenheit (normal body temperature is 98 degrees Fahrenheit). The incision may feel warm to the touch.    **CALL YOUR DOCTOR IF ONE OR MORE OF THESE SIGNS / SYMPTOMS SHOULD OCCUR.    ANESTHESIA INFORMATION   ANESTHESIA -- ADULT PATIENTS:  You have received intravenous sedation / general anesthesia, and you may feel drowsy and light-headed for several hours. You may even experience some forgetfulness of the procedure. DO NOT DRIVE A MOTOR VEHICLE or perform any activity requiring complete alertness or coordination until you feel fully awake in about 24-48 hours. Do not drink alcoholic beverages for at least 24 hours. Do not stay alone, you must have a responsible adult available to be with you. You may also experience a dry mouth or nausea for 24 hours. This is a normal side effect and will disappear as the effects of the medication wear off.    REMEMBER   If you experience any difficulty breathing, chest pain, bleeding that you  feel is excessive, persistent nausea or vomiting or for any other concerns:  Call your physician Dr. McClelland at [phone] or [phone]. You may also ask to have the Urology doctor on call paged. They are available to you 24 hours a day.    SPECIAL INSTRUCTIONS / COMMENTS       FOLLOW-UP APPOINTMENTS   Please call patient services at [phone] or [phone] to schedule a date / time of return. They are open Monday - Friday from 7:30 am - 5:00 pm.

## 2024-04-20 NOTE — Anesthesia Postprocedure Evaluation (Signed)
 Anesthesia Post Op Evaluation    Patient: Hector [name]  Procedure(s):  RESECTION TUMOR BLADDER TRANSURETHRAL    Last Vitals:Temperature: 36.4 C (97.5 F) (04/20/24 1500)  Heart Rate: 77 (04/20/24 1500)  BP (Non-Invasive): (!) 149/74 (04/20/24 1500)  Respiratory Rate: 18 (04/20/24 1500)  SpO2: 93 % (04/20/24 1500)    No notable events documented.    Patient is sufficiently recovered from the effects of anesthesia to participate in the evaluation and has returned to their pre-procedure level.  Patient location during evaluation: PACU       Patient participation: complete - patient participated  Level of consciousness: awake and alert and responsive to verbal stimuli    Pain management: adequate  Airway patency: patent    Anesthetic complications: no  Cardiovascular status: acceptable  Respiratory status: acceptable  Hydration status: acceptable  Patient post-procedure temperature: Pt Normothermic   PONV Status: Absent

## 2024-04-21 DIAGNOSIS — C679 Malignant neoplasm of bladder, unspecified: Secondary | ICD-10-CM

## 2024-04-21 LAB — SURGICAL PATHOLOGY SPECIMEN

## 2024-04-25 ENCOUNTER — Encounter: Payer: Self-pay | Admitting: Internal Medicine

## 2024-04-26 ENCOUNTER — Other Ambulatory Visit: Payer: Self-pay

## 2024-04-26 ENCOUNTER — Ambulatory Visit (INDEPENDENT_AMBULATORY_CARE_PROVIDER_SITE_OTHER): Payer: MEDICAID | Admitting: Nurse Practitioner

## 2024-04-26 ENCOUNTER — Encounter (INDEPENDENT_AMBULATORY_CARE_PROVIDER_SITE_OTHER): Payer: Self-pay | Admitting: Nurse Practitioner

## 2024-04-26 VITALS — BP 140/80 | HR 64 | Temp 97.6°F | Resp 18 | Ht 68.0 in | Wt 222.0 lb

## 2024-04-26 DIAGNOSIS — J449 Chronic obstructive pulmonary disease, unspecified: Secondary | ICD-10-CM

## 2024-04-26 DIAGNOSIS — F1290 Cannabis use, unspecified, uncomplicated: Secondary | ICD-10-CM

## 2024-04-26 DIAGNOSIS — F17210 Nicotine dependence, cigarettes, uncomplicated: Secondary | ICD-10-CM

## 2024-04-26 DIAGNOSIS — Z716 Tobacco abuse counseling: Secondary | ICD-10-CM

## 2024-04-26 DIAGNOSIS — R0609 Other forms of dyspnea: Secondary | ICD-10-CM

## 2024-04-26 DIAGNOSIS — Z9981 Dependence on supplemental oxygen: Secondary | ICD-10-CM

## 2024-04-26 MED ORDER — FLUTICASONE PROPIONATE 230 MCG-SALMETEROL 21 MCG/ACTUATION HFA INHALER
2.0000 | INHALATION_SPRAY | Freq: Two times a day (BID) | RESPIRATORY_TRACT | 5 refills | Status: DC
Start: 2024-04-26 — End: 2024-11-07

## 2024-04-26 MED ORDER — ALBUTEROL SULFATE HFA 90 MCG/ACTUATION AEROSOL INHALER
2.0000 | INHALATION_SPRAY | Freq: Four times a day (QID) | RESPIRATORY_TRACT | 5 refills | Status: AC | PRN
Start: 2024-04-26 — End: ?

## 2024-04-26 NOTE — Progress Notes (Signed)
 PULMONOLOGY, [facility]  [id number]. [place] [address]      Pulmonary Clinic Follow Up    Patient Name: Hector [name]  Date: 04/26/2024  Department:  PULMONOLOGY, [facility]  MRN: [id number]  DOB: 1961/01/10     6 MONTH FOLLOW-UP ENCOUNTER FOR COPD    History of Present Illness:  (04/26/2024)  The patient presents for a 6 month follow-up encounter for COPD.  The patient states that he uses his maintenance inhaler Advair   231-21 mcg two inhalations 3 to 7  times per week, rescue inhaler use Albuterol  2 inhalations usually daily, oxygen  4 Liters per nasal cannula continuously,with benefit of shortness of breath upon exertion patient did wear his oxygen  today's encounter, productive cough continues, no wheezing, continues to smoke 1/2 to 1 pack of cigarettes per day as well as marijuana once daily,no interest to stop smoking.      History of Present Illness:  (10/28/2023)  Hector [name] is a 63 year old male who presents for a 6 month follow-up encounter for COPD management.  The patient reports no worsening of shortness of breath in the past 6 months, daily dry cough with occasional mucus production, inspiratory wheezing,.  The patient states he currently takes his DuoNeb treatments 3 times a day use of Advair  HFA 2 inhalations twice a day with required use of albuterol  rescue inhaler 2 times per day oxygen  supplement used 4 Liters per nasal cannula at night with benefit.  The patient endorses he continues to smoke 1 pack of small cigars per day and really does not desired to stopped smoking uses marijuana as well daily.  The patient remarks that his current respiratory medication is benefiting him and he does not feel a need to change anything,has been feeling good recently,lost 6 pounds.     History of Present Illness: (04/21/2023) Hector [name] is a 63 year old male who presents to the Pulmonary Clinic for a 6 month follow-up visit for COPD,nicotine   dependence.  The patient has a past medical history of alcohol abuse, chronic obstructive pulmonary disease, cirrhosis, after fall related to alcoholism (04/2021), delirium withdrawal from alcohol, depression, hypertension, nose fracture, nicotine  dependence.    The patient admits that he increased his smoking to one pack cigarettes daily,two marijuana joints,uses oxygen  4 liters per nasal cannula continuously,Advair  HFA 231-52mcg two puffs twice a day and short acting bronchodilator Albuterol  twice a day,nebulizer treatment as needed,last treatment 2 days ago.    The patient remarks that he has shortness of breath upon exertion,no worse in the past 6 month,productive cough in the evening dime size of thick clear mucus with inspiratory wheezing.     Pulmonary Function Test ( September 10, 2021 Forced vital capacity 77% of predicted mild reduction, FEV1 78% of predicted mild reduction in FEV1 FVC ratio 75.91 within normal limits noted, reduction in diffusion 59% no gross evidence of obstructive lung disease, no hyperinflation, no significant improvement in the FEV1 with a bronchodilator no restrictive lung disease.     Low Dose CT of Chest for lung cancer screening (11/12/2022):  Negative results annual image (11/13/2023).      Medications    Current Outpatient Medications:     albuterol  sulfate (VENTOLIN  HFA) 90 mcg/actuation Inhalation oral inhaler, Take 2 Puffs by inhalation Every 6 hours as needed for Other (SHORTNESS OF BREATH,COUGH OR WHEEZING), Disp: 1 Each, Rfl: 5    clopidogreL  (PLAVIX ) 75 mg Oral Tablet, Take 1 Tablet (  75 mg total) by mouth Daily (Patient not taking: Reported on 03/14/2024), Disp: , Rfl:     fluticasone  propion-salmeteroL (ADVAIR  HFA) 230-21 mcg/actuation Inhalation oral inhaler, Take 2 Puffs by inhalation Twice daily RINSE MOUTH WITH WATER  AFTER INHALER USE, Disp: 1 g, Rfl: 5    ipratropium-albuterol  0.5 mg-3 mg(2.5 mg base)/3 mL Solution for Nebulization, TAKE 3 ML BY NEBULIZATION THREE  TIMES A DAY AS NEEDED FOR WHEEZING, Disp: 270 mL, Rfl: 1    ketorolac  tromethamine (TORADOL ) 10 mg Oral Tablet, Take 1 Tablet (10 mg total) by mouth Every 6 hours as needed for Pain for up to 15 doses (Patient not taking: Reported on 04/05/2024), Disp: 15 Tablet, Rfl: 0    ketorolac  tromethamine (TORADOL ) 10 mg Oral Tablet, Take 1 Tablet (10 mg total) by mouth Every 6 hours as needed for Pain, Disp: 10 Tablet, Rfl: 0    OLANZapine  (ZYPREXA ) 10 mg Oral Tablet, Take 1 Tablet (10 mg total) by mouth Every night, Disp: , Rfl:     oxygen  (O2) Inhalation gas, Administer 4 L/min into affected nostril(s) continuous, Disp: , Rfl:     pantoprazole  (PROTONIX ) 40 mg Oral Tablet, Delayed Release (E.C.), TAKE 1 TABLET (40 MG TOTAL) BY MOUTH TWICE A DAY 30 MINTUTES BEFORE MEALS FOR 90 DAYS (Patient not taking: Reported on 04/05/2024), Disp: 60 Tablet, Rfl: 3    rosuvastatin  (CRESTOR ) 10 mg Oral Tablet, TAKE 1 TABLET BY MOUTH EVERY DAY IN THE EVENING, Disp: 90 Tablet, Rfl: 1    sertraline  (ZOLOFT ) 100 mg Oral Tablet, Take 1 Tablet (100 mg total) by mouth Daily Pm, Disp: , Rfl:     tamsulosin  (FLOMAX ) 0.4 mg Oral Capsule, Take 1 Capsule (0.4 mg total) by mouth Every evening after dinner, Disp: , Rfl:     traZODone  (DESYREL ) 50 mg Oral Tablet, Take 1 Tablet (50 mg total) by mouth Every night Unsure dosage, Disp: , Rfl:   Allergies  Allergies   Allergen Reactions    Bee Venom Protein (Honey Bee)      Bee stings      Vitals  Vitals:    04/26/24 1258   BP: (!) 140/80   Pulse: 64   Resp: 18   Temp: 36.4 C (97.6 F)   TempSrc: Thermal Scan   SpO2: 94%   Weight: 101 kg (222 lb)   Height: 1.727 m (5\' 8" )   BMI: 33.75        There are no exam notes on file for this visit.   Past Medical History  Past Medical History:   Diagnosis Date    Alcohol abuse     Bladder cancer     Cancer (CMS [facility])     Carotid stenosis     right side stent    Chronic obstructive airway disease     Cirrhosis     Closed fracture of transverse process of thoracic vertebra  05/01/2021    Coma     after fall due to alcholism May 2022    Delirium, withdrawal, alcoholic (CMS HCC)     Depression     Encephalopathy 05/15/2021    Esophageal reflux     H/O urinary tract infection     1/25    Heart murmur     HTN (hypertension)     Hyperlipidemia     Ileus (CMS HCC) 05/18/2021    Left pulmonary contusion 05/01/2021    Leukopenia 05/18/2021    Multiple rib fractures 05/01/2021    Oxygen  dependent  Pleural effusion 05/11/2021    Serum ammonia increased (CMS HCC) 05/15/2021    Shortness of breath     Spleen injury 05/01/2021    Status post thoracentesis 05/19/2021    Thrombocytopenia (CMS HCC) 05/01/2021    Unknown cause of injury     fx nose         Past Surgical History:   Procedure Laterality Date    CAROTID STENT      COLONOSCOPY  08/2021    COLONOSCOPY WITH COLD SNARE POLYPECTOMY N/A 10/29/2023    Performed by Rosezetta Contras, MD at [facility] OR ENDO    COLONOSCOPY WITH COLD SNARE POLYPECTOMY N/A 08/26/2021    Performed by Lida Reeks, MD at [facility] OR ENDO    DIAGNOSTIC URETEROSCOPY Right 02/24/2024    Performed by Ulyess Gammons, MD at [facility] OR 2 WEST    ESOPHAGOGASTRODUODENOSCOPY  08/2021    GASTROSCOPY WITH BIOPSY N/A 09/30/2022    Performed by Lida Reeks, MD at [facility] OR ENDO    GASTROSCOPY WITH BIOPSY N/A 08/26/2021    Performed by Lida Reeks, MD at [facility] OR ENDO    HX OTHER Right 06/29/2023    [facility]    HX TONSILLECTOMY      OTHER SURGICAL HISTORY      HX of plastic surgery to right head    RESECTION TUMOR BLADDER TRANSURETHRAL N/A 04/20/2024    Performed by Ulyess Gammons, MD at [facility] OR 2 WEST    RESECTION TUMOR BLADDER TRANSURETHRAL N/A 02/24/2024    Performed by Ulyess Gammons, MD at [facility] OR 2 WEST    TCAR - IMAGING ONLY Right 06/29/2023    Performed by Kyle Pho, MD at [facility] IN[facility] LABS      Family Medical History:       Problem Relation (Age of Onset)    Asthma Father    Cerebral Aneurysm Sister    Dementia Mother    Heart Attack Paternal Grandmother (28)             Social History     Socioeconomic History    Marital status: Single     Spouse name: vickie    Number of children: 2    Years of education: 12   Tobacco Use    Smoking status: Every Day     Current packs/day: 1.00     Average packs/day: 1 pack/day for 51.4 years (51.4 ttl pk-yrs)     Types: Cigarettes     Start date: 1974    Smokeless tobacco: Never   Vaping Use    Vaping status: Never Used   Substance and Sexual Activity    Alcohol use: Not Currently     Alcohol/week: 15.0 standard drinks of alcohol     Types: 15 Cans of beer per week     Comment:  may 17th, 2022     Drug use: Yes     Types: Marijuana     Comment:  a joint or 2 a day     Sexual activity: Yes   Other Topics Concern    Ability to Walk 1 Flight of Steps without SOB/CP Yes    Ability to Walk 2 Flight of Steps without SOB/CP No     Comment: SOB & CP    Ability To Do Own ADL's Yes     Social Determinants of Health     Financial Resource Strain: Low Risk  (01/28/2024)    Financial Resource Strain  SDOH Financial: No   Transportation Needs: Low Risk  (01/28/2024)    Transportation Needs     SDOH Transportation: No   Social Connections: Low Risk  (02/26/2024)    Social Connections     SDOH Social Isolation: 5 or more times a week   Intimate Partner Violence: Low Risk  (01/28/2024)    Intimate Partner Violence     SDOH Domestic Violence: No   Housing Stability: Low Risk  (01/28/2024)    Housing Stability     SDOH Housing Situation: I have housing.     SDOH Housing Worry: No      Outpatient Medications:  Current Outpatient Medications   Medication Sig    albuterol  sulfate (VENTOLIN  HFA) 90 mcg/actuation Inhalation oral inhaler Take 2 Puffs by inhalation Every 6 hours as needed for Other (SHORTNESS OF BREATH,COUGH OR WHEEZING)    clopidogreL  (PLAVIX ) 75 mg Oral Tablet Take 1 Tablet (75 mg total) by mouth Daily (Patient not taking: Reported on 03/14/2024)    fluticasone  propion-salmeteroL (ADVAIR  HFA) 230-21 mcg/actuation Inhalation oral inhaler Take 2 Puffs  by inhalation Twice daily RINSE MOUTH WITH WATER  AFTER INHALER USE    ipratropium-albuterol  0.5 mg-3 mg(2.5 mg base)/3 mL Solution for Nebulization TAKE 3 ML BY NEBULIZATION THREE TIMES A DAY AS NEEDED FOR WHEEZING    ketorolac  tromethamine (TORADOL ) 10 mg Oral Tablet Take 1 Tablet (10 mg total) by mouth Every 6 hours as needed for Pain for up to 15 doses (Patient not taking: Reported on 04/05/2024)    ketorolac  tromethamine (TORADOL ) 10 mg Oral Tablet Take 1 Tablet (10 mg total) by mouth Every 6 hours as needed for Pain    OLANZapine  (ZYPREXA ) 10 mg Oral Tablet Take 1 Tablet (10 mg total) by mouth Every night    oxygen  (O2) Inhalation gas Administer 4 L/min into affected nostril(s) continuous    pantoprazole  (PROTONIX ) 40 mg Oral Tablet, Delayed Release (E.C.) TAKE 1 TABLET (40 MG TOTAL) BY MOUTH TWICE A DAY 30 MINTUTES BEFORE MEALS FOR 90 DAYS (Patient not taking: Reported on 04/05/2024)    rosuvastatin  (CRESTOR ) 10 mg Oral Tablet TAKE 1 TABLET BY MOUTH EVERY DAY IN THE EVENING    sertraline  (ZOLOFT ) 100 mg Oral Tablet Take 1 Tablet (100 mg total) by mouth Daily Pm    tamsulosin  (FLOMAX ) 0.4 mg Oral Capsule Take 1 Capsule (0.4 mg total) by mouth Every evening after dinner    traZODone  (DESYREL ) 50 mg Oral Tablet Take 1 Tablet (50 mg total) by mouth Every night Unsure dosage       Review of system:  General:  No fever, chills, fatigue,22 pound weight loss in 6 months, appetite change.  No cold sweats at night.  Cardiac:  No chest pain, pressure, palpitations, hands or ankle edema.  Respiratory:  Positive shortness of breath upon exertion,Productive evening cough,No wheezing.  No hemoptysis.  No snoring, gasping for air, nocturnal cough. No witnessed apnea.  Neurological:  No headache or dizziness.  HEENT:  No visual changes, Positive nasal congestion and post nasal drainage.  No neck stiffness, sore throat or swollen glands.  GI:  No dysphagia, nausea, vomiting, constipation, diarrhea.  No blood in stool.  Urinary:   No blood in urine.  Skin:  No rash.    PHYSICAL EXAMINATION:   Constitutional:  Patient alert and oriented, no acute distress, no comfortable appearing.Obese,odor of strong cigarette smoke  General appearance of the patient   HEENT:  Head is normocephalic, atraumatic.  Tympanic membranes are easily  visualized , transparent and pearly gray in color with normal light reflex noted bilaterally.  Nasal turbinates are pink and moist without inflammation or erythema. Nasal vestibule free of rhinorrhea.   Nasal vestibule shows evidence of nicotine  stain mucosa  No tenderness upon palpation of sinus areas. Oral mucosa is pink and moist without lesions, no pharyngeal erythremia or exudate noted.  Trachea midline Neck supple without masses, no lymphadenopathy.  No jugular vein distention.  No thyromegaly.   Cardiovascular:   Apical 64 regular rate, rhythm, normal S1, S2 heard, no Murmurs, rubs, or gallops auscultated.  No carotid bruit.  No ankle edema.  Lungs:  Auscultation anterior and posterior, non labored respirations,18,no, rales, Thorax is symmetrical, chest walls expand equally, no pain upon palpation of upper anterior thorax, expirations not prolonged, no intercostal retractions.  Effort good, No dullness upon percussion.Rhonchi noted anterior bilateral chest,does not clear post cough   Abdomen: Abdomen soft, obese non-distended, non-tender.  Musc: Normal gait and station, no digital cyanosis or clubbing.  Skin:  Color flesh tone.  Skin warm and dry.  No ecchymosis seen, nail beds pink, no clubbing, capillary refill less than four seconds, no rash, lesion or ulcers.   Psych:  Normal affect, interaction, and speech, cooperative, informative, pleasant.    Assessment:  (J44.9) Chronic obstructive pulmonary disease, unspecified COPD type (CMS HCC)  (primary encounter diagnosis)  Plan: albuterol  sulfate (VENTOLIN  HFA) 90         mcg/actuation Inhalation oral inhaler,         fluticasone  propion-salmeteroL (ADVAIR  HFA)          230-21 mcg/actuation Inhalation oral inhaler    (F17.210) Cigarette smoker    (F12.90) Marijuana smoker    (Z71.6) Tobacco abuse counseling    (R06.09) Dyspnea on exertion    (Z99.81) Dependence on supplemental oxygen            Plan:    COPD,responsive to current  Advair  231/21 mcg 2 inhalations twice a day,patient counseled to use inhaler 7 days of the week for better COPD management  Again, smoking cessation discussed with the patient,risk of lung disease progression,potential cardiac events,patient desires to smoke daily   The patient is not interested in [facility],recent diagnosis of Bladder cancer  Continue  Advair  230-69mcg 2 puffs twice a day  Ipratropium/Albuterol   treatments to 3 times a day,at least morning and evening  Continue oxygen  4 liters per nasal cannula oxygen ,patient benefits from oxygen  therapy  The patient is up-to-date with Flu vaccine received (10/20/2023),Pneumovax received (07/13/2023).     The patient was given the opportunity to ask questions and those questions were answered to the patient's satisfaction. The patient was encouraged to call with any additional questions or concerns.   Discussed with patient effects and side effects of medications. Medication safety was discussed      Electronically signed by Edwina Gram, CFNP  Pulmonary

## 2024-04-27 ENCOUNTER — Other Ambulatory Visit (INDEPENDENT_AMBULATORY_CARE_PROVIDER_SITE_OTHER): Payer: Self-pay | Admitting: Student in an Organized Health Care Education/Training Program

## 2024-04-27 ENCOUNTER — Ambulatory Visit (INDEPENDENT_AMBULATORY_CARE_PROVIDER_SITE_OTHER): Payer: Self-pay | Admitting: Internal Medicine

## 2024-04-27 ENCOUNTER — Encounter (HOSPITAL_BASED_OUTPATIENT_CLINIC_OR_DEPARTMENT_OTHER): Payer: MEDICAID | Admitting: Student in an Organized Health Care Education/Training Program

## 2024-04-27 DIAGNOSIS — C679 Malignant neoplasm of bladder, unspecified: Secondary | ICD-10-CM

## 2024-04-27 NOTE — Progress Notes (Signed)
 UROLOGY, PHYSICIAN OFFICE CENTER  [address]  [place] [address]  Operated by [facility]  Telephone Visit    Name:  Hector [name] MRN: [id number]   Date:  04/27/2024 DOB: February 16, 1961 (63 y.o.)          The patient/family initiated a request for telephone service.  Verbal consent for this service was obtained from the patient/family.  Reason for audio only:Patient preference    Last office visit in this department: Visit date not found      Reason for call: discuss pathology    Call notes:  Pathology from second look TURBT showed persistent T1 high grade bladder cancer. Reviewed results with patient. Recommended induction BCG and surveillance cystoscopy in 3 months. He is in agreement with the plan. We will set this up.      ICD-10-CM    1. Malignant neoplasm of urinary bladder, unspecified site (CMS HCC)  C67.9           LOS Determination: Time-based- Total encounter time including direct communication, chart review and documentation was 12 minutes    Ulyess Gammons, MD

## 2024-05-02 ENCOUNTER — Ambulatory Visit: Payer: MEDICAID | Attending: Internal Medicine | Admitting: Internal Medicine

## 2024-05-02 ENCOUNTER — Encounter (INDEPENDENT_AMBULATORY_CARE_PROVIDER_SITE_OTHER): Payer: Self-pay | Admitting: Internal Medicine

## 2024-05-02 ENCOUNTER — Other Ambulatory Visit: Payer: Self-pay

## 2024-05-02 ENCOUNTER — Ambulatory Visit (INDEPENDENT_AMBULATORY_CARE_PROVIDER_SITE_OTHER): Payer: MEDICAID

## 2024-05-02 VITALS — BP 140/72 | HR 64 | Temp 98.3°F | Resp 22 | Ht 68.0 in | Wt 218.0 lb

## 2024-05-02 DIAGNOSIS — J449 Chronic obstructive pulmonary disease, unspecified: Secondary | ICD-10-CM | POA: Insufficient documentation

## 2024-05-02 DIAGNOSIS — R350 Frequency of micturition: Secondary | ICD-10-CM | POA: Insufficient documentation

## 2024-05-02 DIAGNOSIS — F99 Mental disorder, not otherwise specified: Secondary | ICD-10-CM | POA: Insufficient documentation

## 2024-05-02 DIAGNOSIS — C679 Malignant neoplasm of bladder, unspecified: Secondary | ICD-10-CM | POA: Insufficient documentation

## 2024-05-02 LAB — URINALYSIS, MACRO/MICRO
BILIRUBIN: NEGATIVE mg/dL
GLUCOSE: NEGATIVE mg/dL
KETONES: NEGATIVE mg/dL
NITRITE: NEGATIVE
PH: 6 (ref 5.0–9.0)
PROTEIN: 10 mg/dL
SPECIFIC GRAVITY: 1.01 (ref 1.003–1.035)
UROBILINOGEN: <2 mg/dL (ref <2.0)

## 2024-05-02 LAB — MICRO HOLD

## 2024-05-02 LAB — URINE HOLD

## 2024-05-02 MED ORDER — AMOXICILLIN 875 MG-POTASSIUM CLAVULANATE 125 MG TABLET
1.0000 | ORAL_TABLET | Freq: Two times a day (BID) | ORAL | 0 refills | Status: DC
Start: 2024-05-02 — End: 2024-06-02

## 2024-05-02 NOTE — Assessment & Plan Note (Signed)
 Bladder cancer.  Now he has dysuria increased urinary frequency thinks he has urinary tract infection no blood in his urine.  He has no Foley's catheter now.  Will check his urinalysis and treat empirically with 5 days of Augmentin 

## 2024-05-02 NOTE — Progress Notes (Signed)
 INTERNAL MEDICINE, [facility]  [address]  [place] [address]  Operated by [facility]  Progress Note    Name: Hector [name] MRN:  [id number]   Date: 05/02/2024 DOB:  1960-12-30 (63 y.o.)             Reason for Visit: Follow Up 3 Months (Patient here along with spouse for 3 month follow up, patient thinks he may have a UTI, has burning and pain on urination, )    Subjective  Mr. Hector [name] is a 63 y.o. year old male who comes to clinic with the above complaints as we know he has bladder cancer and is recurrent urinary tract infection since then he has been following up at [place]  [facility] he has an appointment coming up he is requesting antibiotics today he has not seen any blood in his urine no fever chills night sweats nausea vomiting     He continues to smoke.  Current Outpatient Medications   Medication Sig    albuterol  sulfate (VENTOLIN  HFA) 90 mcg/actuation Inhalation oral inhaler Take 2 Puffs by inhalation Every 6 hours as needed for Other (SHORTNESS OF BREATH,COUGH OR WHEEZING)    amoxicillin -pot clavulanate (AUGMENTIN ) 875-125 mg Oral Tablet Take 1 Tablet by mouth Twice daily for 5 days Take the 3 days leading up to surgery with Urology    fluticasone  propion-salmeteroL (ADVAIR  HFA) 230-21 mcg/actuation Inhalation oral inhaler Take 2 Puffs by inhalation Twice daily RINSE MOUTH WITH WATER  AFTER INHALER USE    ipratropium-albuterol  0.5 mg-3 mg(2.5 mg base)/3 mL Solution for Nebulization TAKE 3 ML BY NEBULIZATION THREE TIMES A DAY AS NEEDED FOR WHEEZING    ketorolac  tromethamine (TORADOL ) 10 mg Oral Tablet Take 1 Tablet (10 mg total) by mouth Every 6 hours as needed for Pain    OLANZapine  (ZYPREXA ) 10 mg Oral Tablet Take 1 Tablet (10 mg total) by mouth Every night    oxygen  (O2) Inhalation gas Administer 4 L/min into affected nostril(s) continuous    pantoprazole  (PROTONIX ) 40 mg Oral Tablet, Delayed Release (E.C.) TAKE 1 TABLET (40 MG TOTAL) BY MOUTH TWICE A DAY 30  MINTUTES BEFORE MEALS FOR 90 DAYS    rosuvastatin  (CRESTOR ) 10 mg Oral Tablet TAKE 1 TABLET BY MOUTH EVERY DAY IN THE EVENING    sertraline  (ZOLOFT ) 100 mg Oral Tablet Take 1 Tablet (100 mg total) by mouth Daily Pm    tamsulosin  (FLOMAX ) 0.4 mg Oral Capsule Take 1 Capsule (0.4 mg total) by mouth Every evening after dinner    traZODone  (DESYREL ) 50 mg Oral Tablet Take 1 Tablet (50 mg total) by mouth Every night Unsure dosage       Objective   BP (!) 140/72 (Site: Right Arm, Patient Position: Sitting, Cuff Size: Adult)   Pulse 64   Temp 36.8 C (98.3 F) (Tympanic)   Resp (!) 22   Ht 1.727 m (5\' 8" )   Wt 98.9 kg (218 lb)   SpO2 92%   BMI 33.15 kg/m       General:  appears in good health, comfortable  Neck:  no thyromegaly or lymphadenopathy  Lungs:  Breathing nonlabored, Clear to auscultation bilaterally.   Cardiovascular:  regular rate and rhythm, S1, S2 normal, no murmur, click, rub or gallop  Abdomen:  non-distended    Data Reviewed  BASIC METABOLIC PANEL  Lab Results   Component Value Date    SODIUM 137 02/29/2024    POTASSIUM 4.3 02/29/2024    CHLORIDE 110 02/29/2024  CO2 23 02/29/2024    ANIONGAP 4 02/29/2024    BUN 12 02/29/2024    CREATININE 0.90 02/29/2024    BUNCRRATIO 13 02/29/2024    GFR >90 02/29/2024    CALCIUM 8.1 (L) 02/29/2024    GLUCOSE 103 02/29/2024    GLUCOSENF 111 (H) 02/27/2024      Lab Results   Component Value Date    AST 32 02/26/2024    ALT 24 02/26/2024     CBC  Diff   Lab Results   Component Value Date/Time    WBC 3.8 02/29/2024 03:38 AM    HGB 8.1 (L) 02/29/2024 03:38 AM    HCT 23.8 (L) 02/29/2024 03:38 AM    PLTCNT 57 (L) 02/29/2024 03:38 AM    RBC 2.56 (L) 02/29/2024 03:38 AM    MCV 93.0 02/29/2024 03:38 AM    MCHC 34.0 02/29/2024 03:38 AM    MCH 31.6 02/29/2024 03:38 AM    RDW 17.0 (H) 02/28/2024 07:33 AM    MPV 11.1 02/29/2024 03:38 AM    Lab Results   Component Value Date/Time    PMNS 60.6 02/29/2024 03:38 AM    LYMPHOCYTES 24 02/27/2024 04:12 AM    EOSINOPHIL 2 02/27/2024  04:12 AM    MONOCYTES 10.1 02/29/2024 03:38 AM    BASOPHILS 0.8 02/29/2024 03:38 AM    BASOPHILS <0.10 02/29/2024 03:38 AM    PMNABS 2.28 02/29/2024 03:38 AM    LYMPHSABS 0.98 (L) 02/29/2024 03:38 AM    EOSABS <0.10 02/29/2024 03:38 AM    MONOSABS 0.38 02/29/2024 03:38 AM          Assessment and Plan  Problem List Items Addressed This Visit          Respiratory    Chronic obstructive pulmonary disease (Chronic)    Chronic Obstructive Pulmonary Disease continue to smoke sees pulmonologist advised stop smoking he is not interested at this time.            Nephrology    Bladder cancer    Bladder cancer.  Now he has dysuria increased urinary frequency thinks he has urinary tract infection no blood in his urine.  He has no Foley's catheter now.  Will check his urinalysis and treat empirically with 5 days of Augmentin             Psychiatric    Mental disorder (Chronic)    Currently on trazodone  and Zyprexa  follow up with behavioral health as scheduled            Other    Urinary frequency - Primary    Relevant Orders    URINALYSIS WITH REFLEX MICROSCOPIC AND CULTURE IF POSITIVE     Labs reviewed consult note reviewed medication refilled urinalysis ordered call if any questions.  See him back in 3 months for his wellness visit    Nickey Barn, MD

## 2024-05-02 NOTE — Patient Instructions (Signed)
 Get ua     Take abx a s directed     Follow with urology

## 2024-05-02 NOTE — Assessment & Plan Note (Signed)
 Chronic Obstructive Pulmonary Disease continue to smoke sees pulmonologist advised stop smoking he is not interested at this time.

## 2024-05-02 NOTE — Assessment & Plan Note (Signed)
 Currently on trazodone  and Zyprexa  follow up with behavioral health as scheduled

## 2024-05-04 ENCOUNTER — Encounter (INDEPENDENT_AMBULATORY_CARE_PROVIDER_SITE_OTHER): Payer: Self-pay | Admitting: Student in an Organized Health Care Education/Training Program

## 2024-05-04 LAB — URINE CULTURE,ROUTINE: URINE CULTURE: <1000 — AB

## 2024-05-10 ENCOUNTER — Encounter: Payer: Self-pay | Admitting: Orthopaedic Surgery

## 2024-05-10 ENCOUNTER — Encounter: Payer: Self-pay | Admitting: Internal Medicine

## 2024-05-10 ENCOUNTER — Encounter (INDEPENDENT_AMBULATORY_CARE_PROVIDER_SITE_OTHER): Payer: Self-pay | Admitting: Student in an Organized Health Care Education/Training Program

## 2024-05-10 ENCOUNTER — Other Ambulatory Visit (INDEPENDENT_AMBULATORY_CARE_PROVIDER_SITE_OTHER): Payer: Self-pay | Admitting: NURSE PRACTITIONER

## 2024-05-10 ENCOUNTER — Ambulatory Visit (INDEPENDENT_AMBULATORY_CARE_PROVIDER_SITE_OTHER): Payer: Self-pay | Admitting: Student in an Organized Health Care Education/Training Program

## 2024-05-10 DIAGNOSIS — Z79899 Other long term (current) drug therapy: Secondary | ICD-10-CM

## 2024-05-10 DIAGNOSIS — E7800 Pure hypercholesterolemia, unspecified: Secondary | ICD-10-CM

## 2024-05-10 NOTE — Nursing Note (Signed)
 Returning Call  Received: Today  [facility] [place]         Message from [place] sent at 05/10/2024 12:30 PM EDT    Summary: Returning Call    Copied From CRM [id number].  Hector [name] PT:  Hector [name], Hector [name] is returning a call they received from the staff.  He mentioned it was about getting testing done that he needs every few weeks regarding holding urine. He said it was not concerning the procedure with the dr in August. I am not sure what he is referring to. Please call.  Thanks                Call History    Contact Date/Time Type Contact Identity Validated Phone/Fax By   05/10/2024 12:23 PM EDT Phone (Incoming) Hector [name] (Self) Name, DOB, Address [phone] Hector [name]) Hector [name] and spoke to the patient, informed I'm that [facility] does have BCG and the office there should be contacting them soon to get this scheduled. Patient agreed with plan.    Dama Duffel, RN

## 2024-05-11 ENCOUNTER — Other Ambulatory Visit: Payer: Self-pay

## 2024-05-11 ENCOUNTER — Encounter (INDEPENDENT_AMBULATORY_CARE_PROVIDER_SITE_OTHER): Payer: Self-pay | Admitting: Student in an Organized Health Care Education/Training Program

## 2024-05-11 DIAGNOSIS — E7800 Pure hypercholesterolemia, unspecified: Secondary | ICD-10-CM

## 2024-05-11 DIAGNOSIS — Z79899 Other long term (current) drug therapy: Secondary | ICD-10-CM

## 2024-05-11 NOTE — Telephone Encounter (Signed)
 Spoke with patient, re-ordered lipid panel since patient has now been off of his statin for 3 months.

## 2024-05-12 ENCOUNTER — Telehealth: Payer: Self-pay | Admitting: Internal Medicine

## 2024-05-12 NOTE — Telephone Encounter (Signed)
 Active lab order in chart.

## 2024-05-12 NOTE — Telephone Encounter (Signed)
 Patient plans to have labs drawn at [facility]. Please make sure order has been released in the system.--FYI

## 2024-05-13 ENCOUNTER — Encounter (INDEPENDENT_AMBULATORY_CARE_PROVIDER_SITE_OTHER): Payer: Self-pay | Admitting: Student in an Organized Health Care Education/Training Program

## 2024-05-16 ENCOUNTER — Encounter (INDEPENDENT_AMBULATORY_CARE_PROVIDER_SITE_OTHER): Payer: Self-pay | Admitting: Student in an Organized Health Care Education/Training Program

## 2024-05-18 ENCOUNTER — Encounter (INDEPENDENT_AMBULATORY_CARE_PROVIDER_SITE_OTHER): Payer: Self-pay | Admitting: Student in an Organized Health Care Education/Training Program

## 2024-05-19 ENCOUNTER — Ambulatory Visit (HOSPITAL_COMMUNITY): Payer: Self-pay

## 2024-05-19 ENCOUNTER — Encounter (INDEPENDENT_AMBULATORY_CARE_PROVIDER_SITE_OTHER): Payer: Self-pay | Admitting: Student in an Organized Health Care Education/Training Program

## 2024-05-19 ENCOUNTER — Ambulatory Visit: Payer: MEDICAID | Attending: Urology

## 2024-05-19 ENCOUNTER — Other Ambulatory Visit: Payer: Self-pay

## 2024-05-19 DIAGNOSIS — R300 Dysuria: Secondary | ICD-10-CM | POA: Insufficient documentation

## 2024-05-19 DIAGNOSIS — C679 Malignant neoplasm of bladder, unspecified: Secondary | ICD-10-CM | POA: Insufficient documentation

## 2024-05-19 MED ORDER — AMOXICILLIN 875 MG-POTASSIUM CLAVULANATE 125 MG TABLET
1.0000 | ORAL_TABLET | Freq: Two times a day (BID) | ORAL | 0 refills | Status: DC
Start: 2024-05-19 — End: 2024-06-02

## 2024-05-19 NOTE — Patient Instructions (Signed)
-   Ensure drinking plenty of water daily, should have 64 oz as goal    - Notify office of any concerns, issues, or changes.

## 2024-05-19 NOTE — Nursing Note (Signed)
 Patient here for urine dip prior to BCG.  Urine dip shows leukocytes. Patient states he is symptomatic for UTI X 1 week. One dose of Augmentin  given today. Urine being sent for culture.  New BCG appointment made for July 16 th at 2:30 d/t this week being missed.  Judy Null, LPN

## 2024-05-19 NOTE — Addendum Note (Signed)
 Addended by: Gerrianne Krauss on: 05/19/2024 01:44 PM     Modules accepted: Orders

## 2024-05-19 NOTE — Progress Notes (Signed)
 Patient with bladder discomfort and dysuria.  He is here for Urine testing before BCG instillation.  Denies fever/chills.  Negative culture in May.  Positive Culture in February.  Was recently on Augmentin  before TURBT.  Discussed empiric therapy, will start Augmentin  875/125 now with one tablet in office, to continue therapy until Wednesday.  Return one week for next treatment.  Fever/chills or intolerable bladder pain, to the ER. He is aware of risks with empiric therapy and accepts these to start therapy now.     Oretha Birch FNP-C  Advanced Practice Provider  [facility]  Department of Urology    Total time face-to-face spent on today's visit was 10 minutes.   50% of this visit was spent on counseling and education.  This included preparation for the visit (i.e. reviewing test results), performance of a medically appropriate history and examination, and orders for medications, tests or other procedures. This time is exclusive of procedures performed and time spent teaching. All questions and concerns answered to patient's satisfaction. Advised to call office should questions or concerns arise.    Orders Placed This Encounter    URINALYSIS WITH REFLEX MICROSCOPIC AND CULTURE IF POSITIVE    amoxicillin -pot clavulanate (AUGMENTIN ) 875-125 mg Oral Tablet

## 2024-05-20 ENCOUNTER — Telehealth (INDEPENDENT_AMBULATORY_CARE_PROVIDER_SITE_OTHER): Payer: Self-pay | Admitting: INTERNAL MEDICINE

## 2024-05-20 ENCOUNTER — Ambulatory Visit (HOSPITAL_COMMUNITY): Payer: Self-pay

## 2024-05-20 NOTE — Telephone Encounter (Signed)
 Called pt with a reminder for his EGD on 05/26/2024@1150  and he cancelled. He stated he has a cancer treatment on that day. He declined to r/s.   Mindy J Bumgardner

## 2024-05-21 ENCOUNTER — Encounter (INDEPENDENT_AMBULATORY_CARE_PROVIDER_SITE_OTHER): Payer: Self-pay | Admitting: Student in an Organized Health Care Education/Training Program

## 2024-05-21 LAB — URINE CULTURE: URINE CULTURE: <10000 — AB

## 2024-05-23 ENCOUNTER — Ambulatory Visit: Payer: Self-pay | Admitting: Physician Assistant

## 2024-05-23 ENCOUNTER — Ambulatory Visit (HOSPITAL_COMMUNITY): Payer: Self-pay | Admitting: NURSE PRACTITIONER

## 2024-05-23 DIAGNOSIS — E7800 Pure hypercholesterolemia, unspecified: Secondary | ICD-10-CM

## 2024-05-23 DIAGNOSIS — I2510 Atherosclerotic heart disease of native coronary artery without angina pectoris: Secondary | ICD-10-CM

## 2024-05-23 LAB — LIPID PANEL
Chol/HDL Ratio: 3.7 ratio (ref 0.0–5.0)
Cholesterol, Total: 189 mg/dL (ref 100–199)
HDL: 51 mg/dL (ref >39)
LDL Chol Calc (NIH): 124 mg/dL — ABNORMAL HIGH (ref 0–99)
Triglycerides: 74 mg/dL (ref 0–149)
VLDL Cholesterol Cal: 14 mg/dL (ref 5–40)

## 2024-05-25 ENCOUNTER — Encounter (INDEPENDENT_AMBULATORY_CARE_PROVIDER_SITE_OTHER): Payer: Self-pay | Admitting: Student in an Organized Health Care Education/Training Program

## 2024-05-26 ENCOUNTER — Ambulatory Visit (INDEPENDENT_AMBULATORY_CARE_PROVIDER_SITE_OTHER): Payer: MEDICAID

## 2024-05-26 ENCOUNTER — Ambulatory Visit
Admission: RE | Admit: 2024-05-26 | Discharge: 2024-05-26 | Disposition: A | Discharge status: Home / Self Care | Payer: MEDICAID | Source: Ambulatory Visit | Attending: Student in an Organized Health Care Education/Training Program | Admitting: Student in an Organized Health Care Education/Training Program

## 2024-05-26 ENCOUNTER — Encounter (INDEPENDENT_AMBULATORY_CARE_PROVIDER_SITE_OTHER): Payer: Self-pay | Admitting: Student in an Organized Health Care Education/Training Program

## 2024-05-26 ENCOUNTER — Telehealth (INDEPENDENT_AMBULATORY_CARE_PROVIDER_SITE_OTHER): Payer: Self-pay | Admitting: INTERNAL MEDICINE

## 2024-05-26 ENCOUNTER — Ambulatory Visit: Admission: RE | Admit: 2024-05-26 | Payer: MEDICAID | Source: Ambulatory Visit | Admitting: INTERNAL MEDICINE

## 2024-05-26 ENCOUNTER — Other Ambulatory Visit: Payer: Self-pay

## 2024-05-26 ENCOUNTER — Encounter (HOSPITAL_COMMUNITY): Admission: RE | Payer: Self-pay | Source: Ambulatory Visit

## 2024-05-26 VITALS — BP 141/73 | HR 77 | Temp 97.8°F | Resp 18 | Ht 69.0 in | Wt 220.0 lb

## 2024-05-26 DIAGNOSIS — D494 Neoplasm of unspecified behavior of bladder: Secondary | ICD-10-CM | POA: Insufficient documentation

## 2024-05-26 DIAGNOSIS — Z5112 Encounter for antineoplastic immunotherapy: Secondary | ICD-10-CM | POA: Insufficient documentation

## 2024-05-26 DIAGNOSIS — C679 Malignant neoplasm of bladder, unspecified: Secondary | ICD-10-CM | POA: Insufficient documentation

## 2024-05-26 DIAGNOSIS — R300 Dysuria: Secondary | ICD-10-CM | POA: Insufficient documentation

## 2024-05-26 LAB — POCT URINE DIPSTICK
BILIRUBIN: NEGATIVE
COLOR: NORMAL
GLUCOSE: NEGATIVE
KETONE: NEGATIVE
NITRITE: NEGATIVE
PH: 6
SPECIFIC GRAVITY: 1.02
UROBILINOGEN: 0.2

## 2024-05-26 SURGERY — GASTROSCOPY
Anesthesia: Monitor Anesthesia Care

## 2024-05-26 MED ORDER — SODIUM CHLORIDE 0.9 % INTRAVENOUS SOLUTION
50.0000 mg | INHALATION_SUSPENSION | Freq: Once | INTRAVESICAL | Status: AC
Start: 2024-05-26 — End: 2024-05-26
  Administered 2024-05-26: 50 mg via INTRAVESICAL
  Filled 2024-05-26: qty 50

## 2024-05-26 NOTE — Nurses Notes (Signed)
 1700 deflated balloon and removed catheter pt tolerated no reaction and exited OPI via ambulation Nalani Ave, RN

## 2024-05-26 NOTE — Nurses Notes (Signed)
 1443-Pt arrived at [facility] a/o via ambualtion from Drs. Office where 64fr foley was placed for BCG instillation.  Karthikeya Funke, RN

## 2024-05-26 NOTE — Nurses Notes (Signed)
 1500  disconnected leg bag instilled TICE BCG hooked another leg bag up to catheter and clamp the catheter Nalani Ave, RN      [phone] called out felt a lot of pressure--unclamped catheter and drained clear yellow urine 20 cc and re clamped catheter Nalani Ave, RN

## 2024-05-26 NOTE — Telephone Encounter (Signed)
 05/26/24 at 1:42 pm   Message given to alternative contact     GI informed contact that as procedure was not done yet,   clinic visit was being canceled for july 16th ,2025    Contact told to inform patient to contact GI to reschedule     Gennette Kick

## 2024-05-26 NOTE — Nursing Note (Signed)
 Foley Catheter Placement (URO)      Procedure Date:  05/26/2024    Procedure: Foley Catheter Placement      The benefits of the procedure were carefully explained to the patient.  The potential risks and complications of the procedure were also discussed and the patient indicated a clear understanding.  The patient gave verbal consent without reservation     Using strict sterile technique, a 16 fr catheter was placed through urethral meatus and into bladder. Hazy yellow urine returned. The balloon was inflated with 10cc sterile water . Foley was attached to a leg bag. See enter/edit for urine dip results. To outpatient infusion for BCG administration.  Patient tolerated the procedure well with no known complications.    Susanne Epps, RN

## 2024-05-27 ENCOUNTER — Ambulatory Visit (HOSPITAL_COMMUNITY): Payer: Self-pay

## 2024-06-02 ENCOUNTER — Other Ambulatory Visit (HOSPITAL_BASED_OUTPATIENT_CLINIC_OR_DEPARTMENT_OTHER): Payer: Self-pay

## 2024-06-02 ENCOUNTER — Encounter: Payer: Self-pay | Admitting: Family Medicine

## 2024-06-02 ENCOUNTER — Encounter (INDEPENDENT_AMBULATORY_CARE_PROVIDER_SITE_OTHER): Payer: Self-pay | Admitting: Student in an Organized Health Care Education/Training Program

## 2024-06-02 ENCOUNTER — Ambulatory Visit (INDEPENDENT_AMBULATORY_CARE_PROVIDER_SITE_OTHER): Payer: MEDICAID

## 2024-06-02 ENCOUNTER — Other Ambulatory Visit: Payer: Self-pay

## 2024-06-02 ENCOUNTER — Ambulatory Visit
Admission: RE | Admit: 2024-06-02 | Discharge: 2024-06-02 | Disposition: A | Discharge status: Home / Self Care | Payer: MEDICAID | Source: Ambulatory Visit | Attending: Student in an Organized Health Care Education/Training Program | Admitting: Student in an Organized Health Care Education/Training Program

## 2024-06-02 VITALS — BP 149/62 | HR 72 | Temp 97.7°F | Resp 20 | Ht 68.0 in | Wt 220.0 lb

## 2024-06-02 DIAGNOSIS — D494 Neoplasm of unspecified behavior of bladder: Secondary | ICD-10-CM | POA: Insufficient documentation

## 2024-06-02 DIAGNOSIS — Z5112 Encounter for antineoplastic immunotherapy: Secondary | ICD-10-CM | POA: Insufficient documentation

## 2024-06-02 DIAGNOSIS — C679 Malignant neoplasm of bladder, unspecified: Secondary | ICD-10-CM

## 2024-06-02 DIAGNOSIS — F419 Anxiety disorder, unspecified: Secondary | ICD-10-CM

## 2024-06-02 LAB — POCT URINE DIPSTICK
BILIRUBIN: NEGATIVE
COLOR: NORMAL
GLUCOSE: NEGATIVE
KETONE: NEGATIVE
NITRITE: NEGATIVE
PH: 6
SPECIFIC GRAVITY: 1.015
UROBILINOGEN: NEGATIVE

## 2024-06-02 MED ORDER — LORAZEPAM 1 MG PO TABS
1.0000 mg | ORAL_TABLET | ORAL | 0 refills | Status: AC | PRN
Start: 2024-06-02 — End: ?
  Filled 2024-06-02: qty 30, 30d supply, fill #0

## 2024-06-02 MED ORDER — SODIUM CHLORIDE 0.9 % INTRAVENOUS SOLUTION
50.0000 mg | INHALATION_SUSPENSION | Freq: Once | INTRAVESICAL | Status: AC
Start: 2024-06-02 — End: 2024-06-02
  Administered 2024-06-02: 50 mg via INTRAVESICAL
  Filled 2024-06-02: qty 50

## 2024-06-02 NOTE — Nurses Notes (Addendum)
 13:50-  Arrived into OPI for infusion of BCG.  Alert and oriented.  Ambulatory.  VS and brief assessment done.   Patient arrived with #16 foley in place from the urology office.    Noni Beach, RN    14:16-  Dose and medication checked with two Rns.    Patient prepared for instillation of BCG.   Foley and leg bag drained completely.    50 mg dose of BCG instilled into bladder.   Patient tolerated the procedure well.    Patient verbalizes understanding to turn side to side every 15 minutes.    Noni Beach, RN    15:20-  Called out with a bladder spasm complaint.  Gown change.   No other spasms at this time.   Noni Beach, RN    16:25-  Dwell time is complete.  Foley catheter removed.    Patient given wipes for peri care.   Post procedure VS complete.   Discharged to home at 16:40 in good condition.   Noni Beach, RN

## 2024-06-02 NOTE — Nursing Note (Signed)
 Foley Catheter Placement (URO)      Procedure Date:  06/02/2024    Procedure: Foley Catheter Placement      The benefits of the procedure were carefully explained to the patient.  The potential risks and complications of the procedure were also discussed and the patient indicated a clear understanding.  The patient gave verbal consent without reservation     Using strict sterile technique, a 16 fr catheter was placed through urethral meatus and into bladder. Hazy yellow urine. Specimen collected. See enter/edit for results. The balloon was inflated with 10cc sterile water . Foley was attached to a leg bag. To outpatient infusion for BCG.  Patient tolerated the procedure well with no known complications.    Susanne Epps, RN

## 2024-06-03 ENCOUNTER — Ambulatory Visit (HOSPITAL_COMMUNITY): Payer: Self-pay

## 2024-06-07 ENCOUNTER — Other Ambulatory Visit (HOSPITAL_COMMUNITY): Payer: Self-pay | Admitting: Student in an Organized Health Care Education/Training Program

## 2024-06-07 ENCOUNTER — Encounter (INDEPENDENT_AMBULATORY_CARE_PROVIDER_SITE_OTHER): Payer: Self-pay | Admitting: Student in an Organized Health Care Education/Training Program

## 2024-06-08 ENCOUNTER — Other Ambulatory Visit (INDEPENDENT_AMBULATORY_CARE_PROVIDER_SITE_OTHER): Payer: Self-pay | Admitting: Nurse Practitioner

## 2024-06-09 ENCOUNTER — Other Ambulatory Visit: Payer: Self-pay

## 2024-06-09 ENCOUNTER — Encounter (INDEPENDENT_AMBULATORY_CARE_PROVIDER_SITE_OTHER): Payer: Self-pay | Admitting: Student in an Organized Health Care Education/Training Program

## 2024-06-09 ENCOUNTER — Ambulatory Visit
Admission: RE | Admit: 2024-06-09 | Discharge: 2024-06-09 | Disposition: A | Discharge status: Home / Self Care | Payer: MEDICAID | Source: Ambulatory Visit | Attending: Student in an Organized Health Care Education/Training Program | Admitting: Student in an Organized Health Care Education/Training Program

## 2024-06-09 ENCOUNTER — Ambulatory Visit (INDEPENDENT_AMBULATORY_CARE_PROVIDER_SITE_OTHER): Payer: MEDICAID

## 2024-06-09 VITALS — BP 134/58 | HR 81 | Temp 97.5°F | Resp 18 | Ht 68.0 in | Wt 220.0 lb

## 2024-06-09 DIAGNOSIS — C679 Malignant neoplasm of bladder, unspecified: Secondary | ICD-10-CM

## 2024-06-09 DIAGNOSIS — D494 Neoplasm of unspecified behavior of bladder: Secondary | ICD-10-CM

## 2024-06-09 DIAGNOSIS — Z5111 Encounter for antineoplastic chemotherapy: Secondary | ICD-10-CM | POA: Insufficient documentation

## 2024-06-09 LAB — POCT URINE DIPSTICK
BILIRUBIN: NEGATIVE
COLOR: NORMAL
GLUCOSE: NEGATIVE
NITRITE: NEGATIVE
PH: 6
PROTEIN: 30
SPECIFIC GRAVITY: 1.02
UROBILINOGEN: 0.2

## 2024-06-09 MED ORDER — LIDOCAINE 2 % MUCOSAL JELLY IN APPLICATOR
Freq: Once | Status: DC | PRN
Start: 2024-06-09 — End: 2024-06-10
  Filled 2024-06-09: qty 6

## 2024-06-09 MED ORDER — SODIUM CHLORIDE 0.9 % INTRAVENOUS SOLUTION
50.0000 mg | INHALATION_SUSPENSION | Freq: Once | INTRAVESICAL | Status: AC
Start: 2024-06-09 — End: 2024-06-09
  Administered 2024-06-09: 50 mg via INTRAVESICAL
  Filled 2024-06-09: qty 50

## 2024-06-09 NOTE — Nurses Notes (Addendum)
 14:00-  Arrived into OPI for BCG instillation.   Alert and oriented.  Color pale.   Skin warm and dry.  Arrived from Urology office with leg bag and foley catheter in place.   VS and brief assessment done.    Arland Ferretti, RN    15:35-  Time out taken and medication was verified with dose, route, and time.    Foley bag was emptied completely of 200cc of urine.   New leg bag applied.    BGC 50 mg instilled into bladder.   Foley clamped.   Patient will roll side to side for the next two hours.    Denies any questions.    Arland Ferretti, RN    16:30-  Appears to be resting quietly.    Arland Ferretti, RN    16:54- Lying quietly on his back.   Appears to be sleeping.   Arland Ferretti, RN    17:40-  Two hour dwell time is complete.  Clamp from foley released.  BCG and urine drained.   Foley removed.    Post infusion VS done.   Patient dressed and dishcarged at 17:57  Arland Ferretti, RN

## 2024-06-09 NOTE — Nursing Note (Signed)
 Foley Catheter Placement (URO)      Procedure Date:  06/09/2024    Procedure: Foley Catheter Placement      The benefits of the procedure were carefully explained to the patient.  The potential risks and complications of the procedure were also discussed and the patient indicated a clear understanding.  The patient gave verbal consent without reservation     Using strict sterile technique, a 16 fr catheter was placed through urethral meatus and into bladder. Hazy yellow urine returned. See enter/edit for urine dip results. The balloon was inflated with 10cc sterile water . Foley was attached to a leg bag.   Patient tolerated the procedure well with no known complications.  To outpatient infusion for BCG treatment.    Avelina Cota, RN

## 2024-06-10 ENCOUNTER — Ambulatory Visit (HOSPITAL_COMMUNITY): Payer: Self-pay

## 2024-06-12 ENCOUNTER — Encounter (INDEPENDENT_AMBULATORY_CARE_PROVIDER_SITE_OTHER): Payer: Self-pay | Admitting: Student in an Organized Health Care Education/Training Program

## 2024-06-12 ENCOUNTER — Other Ambulatory Visit (HOSPITAL_COMMUNITY): Payer: Self-pay | Admitting: Student in an Organized Health Care Education/Training Program

## 2024-06-15 ENCOUNTER — Encounter: Payer: Self-pay | Admitting: Family Medicine

## 2024-06-15 ENCOUNTER — Other Ambulatory Visit: Payer: Self-pay

## 2024-06-15 ENCOUNTER — Ambulatory Visit
Admission: RE | Admit: 2024-06-15 | Discharge: 2024-06-15 | Disposition: A | Discharge status: Home / Self Care | Payer: MEDICAID | Source: Ambulatory Visit | Attending: Student in an Organized Health Care Education/Training Program | Admitting: Student in an Organized Health Care Education/Training Program

## 2024-06-15 ENCOUNTER — Ambulatory Visit (INDEPENDENT_AMBULATORY_CARE_PROVIDER_SITE_OTHER): Payer: MEDICAID

## 2024-06-15 VITALS — Ht 68.0 in | Wt 220.0 lb

## 2024-06-15 DIAGNOSIS — C679 Malignant neoplasm of bladder, unspecified: Secondary | ICD-10-CM

## 2024-06-15 DIAGNOSIS — D494 Neoplasm of unspecified behavior of bladder: Secondary | ICD-10-CM | POA: Insufficient documentation

## 2024-06-15 DIAGNOSIS — R42 Dizziness and giddiness: Secondary | ICD-10-CM

## 2024-06-15 LAB — POCT URINE DIPSTICK
BILIRUBIN: NEGATIVE
COLOR: NORMAL
GLUCOSE: NEGATIVE
KETONE: NEGATIVE
NITRITE: NEGATIVE
PH: 6
SPECIFIC GRAVITY: 1.01
UROBILINOGEN: 0.2

## 2024-06-15 MED ORDER — SODIUM CHLORIDE 0.9 % INTRAVENOUS SOLUTION
50.0000 mg | Freq: Once | INTRAVESICAL | Status: AC
Start: 2024-06-15 — End: 2024-06-15
  Administered 2024-06-15: 50 mg via INTRAVESICAL
  Filled 2024-06-15: qty 50

## 2024-06-15 MED ORDER — LIDOCAINE 2 % MUCOSAL JELLY IN APPLICATOR
Freq: Once | Status: DC | PRN
Start: 2024-06-15 — End: 2024-06-16
  Filled 2024-06-15: qty 6

## 2024-06-15 NOTE — Addendum Note (Signed)
 Addended by: WATT RAISIN C on: 06/15/2024 03:46 PM   Modules accepted: Orders

## 2024-06-15 NOTE — Nursing Note (Signed)
 Foley Catheter Placement (URO)      Procedure Date:  06/15/2024    Procedure: Foley Catheter Placement      The benefits of the procedure were carefully explained to the patient.  The potential risks and complications of the procedure were also discussed and the patient indicated a clear understanding.  The patient gave verbal consent without reservation     Using strict sterile technique, a 16 fr catheter was placed through urethral meatus and into bladder. Clear yellow urine returned. See enter/edit for urine dip results. The balloon was inflated with 10cc sterile water . Foley was attached to a leg bag.   Patient tolerated the procedure well with no known complications. To outpatient infusion to receive BCG tx.    Avelina Cota, RN

## 2024-06-15 NOTE — Nurses Notes (Signed)
 1630 pt complained of spasms had some leakage from catheter--unclamped and drained bladder and deflated the balloon and removed catheter pt tolerated and exited OPI via ambulation Shona Lund, RN

## 2024-06-15 NOTE — Nurses Notes (Addendum)
 1403 pt arrived via ambulation for BCG--pt is A&O and has no complaints or concerns at this time Shona Lund, RN    14:25-  Time out taken prior to procedure.   Arrived on OPI with foley already in place from the urology office.  Foley draining light amber urine.   Foley drained.    BCG 50 mg instilled into foley.   Catheter clamped.    Patient reminded to turn side to side every 15 minutes.    Arland Ferretti, RN

## 2024-06-16 ENCOUNTER — Ambulatory Visit (HOSPITAL_COMMUNITY): Payer: Self-pay

## 2024-06-17 ENCOUNTER — Ambulatory Visit (HOSPITAL_COMMUNITY): Payer: Self-pay

## 2024-06-21 ENCOUNTER — Ambulatory Visit (INDEPENDENT_AMBULATORY_CARE_PROVIDER_SITE_OTHER): Admitting: Internal Medicine

## 2024-06-21 ENCOUNTER — Other Ambulatory Visit (HOSPITAL_BASED_OUTPATIENT_CLINIC_OR_DEPARTMENT_OTHER): Payer: Self-pay

## 2024-06-21 ENCOUNTER — Encounter: Payer: Self-pay | Admitting: Internal Medicine

## 2024-06-21 ENCOUNTER — Other Ambulatory Visit (HOSPITAL_COMMUNITY): Payer: Self-pay | Admitting: NURSE PRACTITIONER

## 2024-06-21 ENCOUNTER — Encounter (INDEPENDENT_AMBULATORY_CARE_PROVIDER_SITE_OTHER): Payer: Self-pay | Admitting: Student in an Organized Health Care Education/Training Program

## 2024-06-21 VITALS — BP 122/76 | HR 67 | Ht 65.0 in | Wt 142.0 lb

## 2024-06-21 DIAGNOSIS — Z8719 Personal history of other diseases of the digestive system: Secondary | ICD-10-CM | POA: Diagnosis not present

## 2024-06-21 DIAGNOSIS — K219 Gastro-esophageal reflux disease without esophagitis: Secondary | ICD-10-CM | POA: Diagnosis not present

## 2024-06-21 DIAGNOSIS — Z860101 Personal history of adenomatous and serrated colon polyps: Secondary | ICD-10-CM | POA: Diagnosis not present

## 2024-06-21 DIAGNOSIS — K648 Other hemorrhoids: Secondary | ICD-10-CM

## 2024-06-21 DIAGNOSIS — Z860100 Personal history of colon polyps, unspecified: Secondary | ICD-10-CM

## 2024-06-21 DIAGNOSIS — K5730 Diverticulosis of large intestine without perforation or abscess without bleeding: Secondary | ICD-10-CM

## 2024-06-21 MED ORDER — NA SULFATE-K SULFATE-MG SULF 17.5-3.13-1.6 GM/177ML PO SOLN
1.0000 | ORAL | 0 refills | Status: DC
  Filled 2024-06-21: qty 354, 1d supply, fill #0

## 2024-06-21 NOTE — Patient Instructions (Signed)
 We have sent the following medications to your pharmacy for you to pick up at your convenience: Suprep   You have been scheduled for a colonoscopy. Please follow written instructions given to you at your visit today.   If you use inhalers (even only as needed), please bring them with you on the day of your procedure.  DO NOT TAKE 7 DAYS PRIOR TO TEST- Trulicity (dulaglutide) Ozempic, Wegovy (semaglutide) Mounjaro (tirzepatide) Bydureon Bcise (exanatide extended release)  DO NOT TAKE 1 DAY PRIOR TO YOUR TEST Rybelsus (semaglutide) Adlyxin (lixisenatide) Victoza (liraglutide) Byetta (exanatide) ___________________________________________________________________________   Due to recent changes in healthcare laws, you may see the results of your imaging and laboratory studies on MyChart before your provider has had a chance to review them.  We understand that in some cases there may be results that are confusing or concerning to you. Not all laboratory results come back in the same time frame and the provider may be waiting for multiple results in order to interpret others.  Please give us  48 hours in order for your provider to thoroughly review all the results before contacting the office for clarification of your results.   Thank you for choosing me and [facility].  Dr.John Abran

## 2024-06-22 ENCOUNTER — Encounter: Payer: Self-pay | Admitting: Internal Medicine

## 2024-06-22 ENCOUNTER — Ambulatory Visit (HOSPITAL_BASED_OUTPATIENT_CLINIC_OR_DEPARTMENT_OTHER): Payer: MEDICAID

## 2024-06-22 ENCOUNTER — Encounter (INDEPENDENT_AMBULATORY_CARE_PROVIDER_SITE_OTHER): Payer: Self-pay | Admitting: Student in an Organized Health Care Education/Training Program

## 2024-06-22 ENCOUNTER — Other Ambulatory Visit: Payer: Self-pay

## 2024-06-22 ENCOUNTER — Ambulatory Visit
Admission: RE | Admit: 2024-06-22 | Discharge: 2024-06-22 | Disposition: A | Discharge status: Home / Self Care | Payer: MEDICAID | Source: Ambulatory Visit | Attending: Student in an Organized Health Care Education/Training Program | Admitting: Student in an Organized Health Care Education/Training Program

## 2024-06-22 ENCOUNTER — Ambulatory Visit (HOSPITAL_COMMUNITY): Payer: Self-pay

## 2024-06-22 VITALS — BP 133/64 | HR 76 | Temp 97.5°F | Resp 18 | Ht 68.0 in | Wt 220.0 lb

## 2024-06-22 DIAGNOSIS — D494 Neoplasm of unspecified behavior of bladder: Secondary | ICD-10-CM | POA: Insufficient documentation

## 2024-06-22 DIAGNOSIS — Z5112 Encounter for antineoplastic immunotherapy: Secondary | ICD-10-CM | POA: Insufficient documentation

## 2024-06-22 DIAGNOSIS — C679 Malignant neoplasm of bladder, unspecified: Secondary | ICD-10-CM

## 2024-06-22 LAB — POCT URINE DIPSTICK
BILIRUBIN: NEGATIVE
GLUCOSE: NEGATIVE
KETONE: NEGATIVE
NITRITE: NEGATIVE
PH: 5
SPECIFIC GRAVITY: 1.005
UROBILINOGEN: 0.2

## 2024-06-22 MED ORDER — SODIUM CHLORIDE 0.9 % INTRAVENOUS SOLUTION
50.0000 mg | Freq: Once | INTRAVESICAL | Status: AC
Start: 2024-06-22 — End: 2024-06-22
  Administered 2024-06-22: 50 mg via INTRAVESICAL
  Filled 2024-06-22: qty 50

## 2024-06-22 NOTE — Progress Notes (Signed)
 HISTORY OF PRESENT ILLNESS:  Curtis Luna is a 63 y.o. male, native of [place], with a history of GERD as well as complicated diverticular disease requiring sigmoid colectomy.  He presents today with concerns over possible painful hemorrhoid and the need for surveillance colonoscopy.  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  Follow-up in 3 years recommended.  No significant hemorrhoids noted at that time.  Patient tells me that in May 2024 he saw his primary care provider regarding a pea-sized rectal protuberance which was not painful.  Though no  rectal exam was performed, he was diagnosed with thrombosed hemorrhoid.  Treated with witch hazel and Preparation H.  Improved.  He developed the same problem the end of April this year.  A bit larger.  Now better.  He wonders if this is something that needs to be addressed.  His bowel habits are regular.  No other GI complaints. EGD June 2020 was normal   REVIEW OF SYSTEMS:  All non-GI ROS negative except for Allergies, anxiety, back pain, visual change.   Past Medical History:  Diagnosis Date   Abdominal pain, right lower quadrant    Acute bronchospasm    Allergy    Anxiety    on and off   Backache, unspecified    Chicken pox    Diverticulosis of colon (without mention of hemorrhage)    Esophageal reflux    External hemorrhoids without mention of complication    thrombosed   Hiatal hernia    Hyperlipidemia    no medicines    Inguinal hernia without mention of obstruction or gangrene, unilateral or unspecified, (not specified as recurrent)    Migraine    Neuropathy    Obstructive sleep apnea (adult) (pediatric)    uses oral appliance   Other symptoms involving digestive system(787.99)    Pain in joint, forearm    Pain in joint, shoulder region    Seasonal allergies    Sleep apnea    no cpap but uses dental device    Thoracic or  lumbosacral neuritis or radiculitis, unspecified     Past Surgical History:  Procedure Laterality Date   COLON RESECTION  10/06   sigmoid colon   COLONOSCOPY     INGUINAL HERNIA REPAIR  12/2012   Left   UPPER GASTROINTESTINAL ENDOSCOPY     WISDOM TOOTH EXTRACTION      Social History Curtis Luna  reports that he quit smoking about 36 years ago. His smoking use included cigarettes and cigars. He has never used smokeless tobacco. He reports current alcohol use of about 14.0 standard drinks of alcohol per week. He reports that he does not use drugs.  family history includes Allergies in his daughter; Aneurysm in his sister; Diabetes in his maternal aunt; Healthy in his brother; Heart attack in his paternal grandmother; Heart disease in his paternal grandmother; Lung cancer (age of onset: 77) in his mother; Migraines in his daughter; Neuropathy in an other family member; Other in his father.  Allergies  Allergen Reactions   Escitalopram Hives    Pt felt anxious  Pt felt his skin was on fire   Influenza Vaccines Other (See Comments)    Brain fog, dizziness, tinnitus   Protonix  [Pantoprazole  Sodium] Other (See Comments)    Tingling of extremities       PHYSICAL EXAMINATION: Vital signs: BP 122/76   Pulse 67   Ht  5' 5 (1.651 m)   Wt 142 lb (64.4 kg)   BMI 23.63 kg/m   Constitutional: generally well-appearing, no acute distress Psychiatric: alert and oriented x3, cooperative Eyes: extraocular movements intact, anicteric, conjunctiva pink Mouth: oral pharynx moist, no lesions Neck: supple no lymphadenopathy Cardiovascular: heart regular rate and rhythm, no murmur Lungs: clear to auscultation bilaterally Abdomen: soft, nontender, nondistended, no obvious ascites, no peritoneal signs, normal bowel sounds, no organomegaly Rectal: Deferred until colonoscopy Extremities: no clubbing, cyanosis, or lower extremity edema bilaterally Skin: no lesions on visible extremities Neuro:  No focal deficits.  Cranial nerves intact  ASSESSMENT:  1.  Question thrombosed hemorrhoid intermittently.  Currently asymptomatic 2.  GERD.  Normal upper endoscopy 2020.  On Pepcid 3.  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  Due for surveillance 4.  History of complicated diverticular disease status post sigmoid colectomy  PLAN:  1.  Reflux precautions 2.  Surveillance colonoscopy.The nature of the procedure, as well as the risks, benefits, and alternatives were carefully and thoroughly reviewed with the patient. Ample time for discussion and questions allowed. The patient understood, was satisfied, and agreed to proceed. 3.  Further recommendations regarding possible hemorrhoids to be determined after the above.

## 2024-06-22 NOTE — Nurses Notes (Addendum)
 1423 BCG inserted via Foley catheter. Patient tolerated well. Patient instructed to turn side to side and that the BCG needs to dwell for 2 hours. Patient verbalized understanding Alan Hamilton, RN    14:23-  Patient arrived from urology with 16 fr foley already in place.    BCG checked by two Rns and time out was established for safety.    Foley clamped and left in place.     Arland Ferretti, RN    15:00-  Appears to be resting quietly.   Arland Ferretti, RN    16:35- Two hour dwell time completed.    Clamp released.   Foley drained 400 cc yellow urine.    Foley removed without difficulty.  Patient tolerated well  Arland Ferretti, RN  .     16:50-  Patient performed his own peri care.   Dressed self and exited the department in good condition   Arland Ferretti, RN

## 2024-06-22 NOTE — Nurses Notes (Signed)
 1347 Patient arrived for BCG, ambulatory. Denies any concerns at this time. Alan Hamilton, RN

## 2024-06-22 NOTE — Nursing Note (Signed)
 Procedure Date:  06/22/2024  Time:  1:52P  Procedure: Foley Catheter Placement      Time out performed and patient identified yes. Urine was dipped and ok to proceed.  The benefits of the procedure were carefully explained to the patient.  The potential risks and complications of the procedure were also discussed and the patient indicated a clear understanding.  The patient gave verbal consent without reservation     Using strict sterile technique, a 16 fr catheter was placed through urethral meatus and into bladder.   Patient tolerated the procedure well with no known complications.  Chiquita Millin, LPN

## 2024-06-24 ENCOUNTER — Ambulatory Visit (HOSPITAL_COMMUNITY): Payer: Self-pay

## 2024-06-26 ENCOUNTER — Other Ambulatory Visit (HOSPITAL_COMMUNITY): Payer: Self-pay | Admitting: NURSE PRACTITIONER

## 2024-06-28 ENCOUNTER — Encounter (INDEPENDENT_AMBULATORY_CARE_PROVIDER_SITE_OTHER): Payer: Self-pay | Admitting: Student in an Organized Health Care Education/Training Program

## 2024-06-29 ENCOUNTER — Ambulatory Visit (INDEPENDENT_AMBULATORY_CARE_PROVIDER_SITE_OTHER): Payer: Self-pay | Admitting: Medical

## 2024-06-29 ENCOUNTER — Ambulatory Visit (HOSPITAL_BASED_OUTPATIENT_CLINIC_OR_DEPARTMENT_OTHER): Payer: MEDICAID

## 2024-06-29 ENCOUNTER — Ambulatory Visit
Admission: RE | Admit: 2024-06-29 | Discharge: 2024-06-29 | Disposition: A | Discharge status: Home / Self Care | Payer: MEDICAID | Source: Ambulatory Visit | Attending: NURSE PRACTITIONER | Admitting: NURSE PRACTITIONER

## 2024-06-29 ENCOUNTER — Other Ambulatory Visit: Payer: Self-pay

## 2024-06-29 VITALS — BP 127/45 | HR 82 | Temp 98.2°F | Resp 16 | Ht 68.0 in | Wt 191.8 lb

## 2024-06-29 DIAGNOSIS — C679 Malignant neoplasm of bladder, unspecified: Secondary | ICD-10-CM

## 2024-06-29 DIAGNOSIS — Z5112 Encounter for antineoplastic immunotherapy: Secondary | ICD-10-CM | POA: Insufficient documentation

## 2024-06-29 DIAGNOSIS — D494 Neoplasm of unspecified behavior of bladder: Secondary | ICD-10-CM | POA: Insufficient documentation

## 2024-06-29 LAB — POCT URINE DIPSTICK
BILIRUBIN: NEGATIVE
COLOR: NORMAL
GLUCOSE: NEGATIVE
KETONE: NEGATIVE
NITRITE: NEGATIVE
PH: 6
SPECIFIC GRAVITY: 1.01
UROBILINOGEN: 0.2

## 2024-06-29 MED ORDER — SODIUM CHLORIDE 0.9 % INTRAVENOUS SOLUTION
50.0000 mg | Freq: Once | INTRAVESICAL | Status: AC
Start: 2024-06-29 — End: 2024-06-29
  Administered 2024-06-29: 50 mg via INTRAVESICAL
  Filled 2024-06-29: qty 50

## 2024-06-29 MED ORDER — LIDOCAINE 2 % MUCOSAL JELLY IN APPLICATOR
Freq: Once | Status: DC | PRN
Start: 2024-06-29 — End: 2024-06-30

## 2024-06-29 NOTE — Nurses Notes (Signed)
 Arrived into OPI for BCG instillation.   Alert and oriented.  Skin warm and dry.   No complaints offered.    Expresses being happy that this is his last treatment of this round.   Brief assessment done and VS completed.   Arland Ferretti, RN    14:25-  Medication and patient was verified by this RN and CANDIE Hamilton RN.   Arrived with #16 foley from urology office.  Leg bag intact.     BCG 50 mg / 50 ml instilled into bladder after draining.  Foley catheter clamped.    Arland Ferretti, RN    15:30-  Patient turning side to side as instructed.   16:30-  Patient able to turn and hold contents for the entire 2 hours however, he had some moisture on his pads from a bladder spasm near the end of his dwell time.    Arland Ferretti, RN    16:40- Foley released and drained.    Foley removed after 10cc was removed from the balloon.  Tolerated well.   Arland Ferretti, RN    16:55-  Peri care and dressing done by patient.  Post procedure VS done.  Exited the department at 17:10  Arland Ferretti, RN

## 2024-06-29 NOTE — Nursing Note (Signed)
 Foley Catheter Placement (URO)      Procedure Date:  06/29/2024    Procedure: Foley Catheter Placement      The benefits of the procedure were carefully explained to the patient.  The potential risks and complications of the procedure were also discussed and the patient indicated a clear understanding.  The patient gave verbal consent without reservation     Using strict sterile technique, a 16 fr catheter was placed through urethral meatus and into bladder. Hazy yellow urine returned. Specimen collected to be checked prior to BCG administration. See enter/edit for results. The balloon was inflated with 10cc sterile water . Foley was attached to a leg bag.   Patient tolerated the procedure well with no known complications.  To outpatient infusion for BCG tx.Avelina Cota, RN      Avelina Cota, RN

## 2024-06-30 ENCOUNTER — Encounter (INDEPENDENT_AMBULATORY_CARE_PROVIDER_SITE_OTHER): Payer: Self-pay | Admitting: Student in an Organized Health Care Education/Training Program

## 2024-07-05 ENCOUNTER — Other Ambulatory Visit: Payer: Self-pay

## 2024-07-05 ENCOUNTER — Ambulatory Visit

## 2024-07-05 ENCOUNTER — Ambulatory Visit: Attending: Family Medicine

## 2024-07-05 DIAGNOSIS — H8113 Benign paroxysmal vertigo, bilateral: Secondary | ICD-10-CM | POA: Insufficient documentation

## 2024-07-05 DIAGNOSIS — R42 Dizziness and giddiness: Secondary | ICD-10-CM | POA: Insufficient documentation

## 2024-07-05 NOTE — Therapy (Signed)
 OUTPATIENT PHYSICAL THERAPY VESTIBULAR EVALUATION     Patient Name: Curtis Luna MRN: [id number] DOB:March 18, 1961, 63 y.o., male Today's Date: 07/05/2024  END OF SESSION:  PT End of Session - 07/05/24 1315     Visit Number 1    Date for PT Re-Evaluation 08/30/24    Progress Note Due on Visit 10    PT Start Time 1317    PT Stop Time 1400    PT Time Calculation (min) 43 min    Activity Tolerance Patient tolerated treatment well    Behavior During Therapy WFL for tasks assessed/performed          Past Medical History:  Diagnosis Date   Abdominal pain, right lower quadrant    Acute bronchospasm    Allergy    Anxiety    on and off   Backache, unspecified    Chicken pox    Diverticulosis of colon (without mention of hemorrhage)    Esophageal reflux    External hemorrhoids without mention of complication    thrombosed   Hiatal hernia    Hyperlipidemia    no medicines    Inguinal hernia without mention of obstruction or gangrene, unilateral or unspecified, (not specified as recurrent)    Migraine    Neuropathy    Obstructive sleep apnea (adult) (pediatric)    uses oral appliance   Other symptoms involving digestive system(787.99)    Pain in joint, forearm    Pain in joint, shoulder region    Seasonal allergies    Sleep apnea    no cpap but uses dental device    Thoracic or lumbosacral neuritis or radiculitis, unspecified    Past Surgical History:  Procedure Laterality Date   COLON RESECTION  10/06   sigmoid colon   COLONOSCOPY     INGUINAL HERNIA REPAIR  12/2012   Left   UPPER GASTROINTESTINAL ENDOSCOPY     WISDOM TOOTH EXTRACTION     Patient Active Problem List   Diagnosis Date Noted   Ulnar neuropathy at elbow of left upper extremity 12/06/2023   Allergic reaction to vaccine 11/12/2020   Toe pain, right 08/23/2018   Pain of left heel 06/27/2018   Multiple thyroid  nodules 11/05/2015   Peripheral neuropathy 04/18/2015   Vertigo 04/15/2015   FH: brain  aneurysm 04/15/2015   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   Seasonal affective disorder (HCC) 10/02/2014   Internal hemorrhoid 04/20/2014   Chronic low back pain 04/20/2014   Anxiety 04/20/2014   Esophageal reflux 04/04/2011   Diverticulosis of colon 04/04/2011    PCP: Watt Raisin, MD REFERRING PROVIDER: same  REFERRING DIAG: vertigo  THERAPY DIAG:  BPPV (benign paroxysmal positional vertigo), bilateral  ONSET DATE: exacerbated x 12 weeks, march 2025  Rationale for Evaluation and Treatment: Rehabilitation  SUBJECTIVE:   SUBJECTIVE STATEMENT: Vertigo intermittently for several years, about 12 years.  Now episodes of intensity.  Has seen ENT in the past and ruled out menieres, etc.  Has ramped up in frequency over the last several weeks. Knows how to do the Epley and performs at home when flared up.  Notes more dizziness with head movement up/ down Pt accompanied by: self  PERTINENT HISTORY: chronic vertigo x 12 yrs , referred by PCP  PAIN:  Are you having pain? Some suboccipital /upper neck pain and B upper traps pain  PRECAUTIONS: None  RED FLAGS: None   WEIGHT BEARING RESTRICTIONS: No  FALLS: Has patient fallen in last 6 months? No  LIVING  ENVIRONMENT: Lives with: lives with their family Lives in: House/apartment Stairs: not asked Has following equipment at home: None  PLOF: Independent, works as Acupuncturist  PATIENT GOALS: get rid of this dizziness so I can do home tasks more easily  OBJECTIVE:  Note: Objective measures were completed at Evaluation unless otherwise noted.  DIAGNOSTIC FINDINGS: na  COGNITION: Overall cognitive status: Within functional limits for tasks assessed   SENSATION: WFL  EDEMA:  none  MUSCLE TONE:  none  DTRs:  none  POSTURE:  Forward head   Cervical ROM:     A/PROM (deg) eval  Flexion 70  Extension 50  Right lateral flexion   Left lateral flexion   Right rotation 70  Left rotation  70  (Blank rows = not tested)  STRENGTH: wfl x 4 ext    GAIT: Gait pattern: WFL, brace on R knee FUNCTIONAL TESTS:  Dynamic Gait Index: Dynamic Gait Index TBD   PATIENT SURVEYS:  DHI: THE DIZZINESS HANDICAP INVENTORY (DHI)  P1. Does looking up increase your problem? 4 = Yes  E2. Because of your problem, do you feel frustrated? 4 = Yes  F3. Because of your problem, do you restrict your travel for business or recreation?  0 = No  P4. Does walking down the aisle of a supermarket increase your problems?  0 = No  F5. Because of your problem, do you have difficulty getting into or out of bed?  4 = Yes  F6. Does your problem significantly restrict your participation in social activities, such as going out to dinner, going to the movies, dancing, or going to parties? 2 = Sometimes  F7. Because of your problem, do you have difficulty reading?  0 = No  P8. Does performing more ambitious activities such as sports, dancing, household chores (sweeping or putting dishes away) increase your problems?  2 = Sometimes  E9. Because of your problem, are you afraid to leave your home without having without having someone accompany you?  0 = No  E10. Because of your problem have you been embarrassed in front of others?  0 = No  P11. Do quick movements of your head increase your problem?  4 = Yes  F12. Because of your problem, do you avoid heights?  2 = Sometimes  P13. Does turning over in bed increase your problem?  0 = No  F14. Because of your problem, is it difficult for you to do strenuous homework or yard work? 2 = Sometimes  E15. Because of your problem, are you afraid people may think you are intoxicated? 0 = No  F16. Because of your problem, is it difficult for you to go for a walk by yourself?  0 = No  P17. Does walking down a sidewalk increase your problem?  0 = No  E18.Because of your problem, is it difficult for you to concentrate 0 = No  F19. Because of your problem, is it difficult for you to  walk around your house in the dark? 4 = Yes  E20. Because of your problem, are you afraid to stay home alone?  0 = No  E21. Because of your problem, do you feel handicapped? 0 = No  E22. Has the problem placed stress on your relationships with members of your family or friends? 0 = No  E23. Because of your problem, are you depressed?  0 = No  F24. Does your problem interfere with your job or household responsibilities?  2 = Sometimes  P25. Does bending over increase your problem?  4 = Yes  TOTAL 34/100    DHI Scoring Instructions  The patient is asked to answer each question as it pertains to dizziness or unsteadiness problems, specifically  considering their condition during the last month. Questions are designed to incorporate functional (F), physical  (P), and emotional (E) impacts on disability.   Scores greater than 10 points should be referred to balance specialists for further evaluation.   16-34 Points (mild handicap)  36-52 Points (moderate handicap)  54+ Points (severe handicap)  Minimally Detectable Change: 17 points ([address] [place], 1990)  [place], G. SHAUNNA. and [place], C. W. (1990). The development of the Dizziness Handicap Inventory. Archives of Otolaryngology - Head and Neck Surgery 116(4): [id number].   VESTIBULAR ASSESSMENT:  GENERAL OBSERVATION: no obvious guarding, loss of balance   SYMPTOM BEHAVIOR:  Subjective history: occurs with cervical extension primarily, and will occur once every few weeks, then last for days  Non-Vestibular symptoms: na  Type of dizziness: Diplopia, Imbalance (Disequilibrium), Spinning/Vertigo, and Unsteady with head/body turns  Frequency: once every 2 - 3 weeks  Duration: several days  Aggravating factors: Induced by motion: looking up at the ceiling and bending down to the ground  Relieving factors: self treating with epley  Progression of symptoms: unchanged  OCULOMOTOR EXAM:  Ocular Alignment: normal  Ocular ROM: No  Limitations  Spontaneous Nystagmus: absent  Gaze-Induced Nystagmus: absent  Smooth Pursuits: intact  Saccades: intact  Convergence/Divergence: 4 cm    VESTIBULAR - OCULAR REFLEX:   Slow VOR: wnl  VOR Cancellation: wnl  Head-Impulse Test: wnl  Dynamic Visual Acuity: TBD   POSITIONAL TESTING: Right Dix-Hallpike: no nystagmus Left Dix-Hallpike: no nystagmus, + dizziness with delayed onset  and Duration: greater than 30 sec  Right Roll Test: no nystagmus and reports some dizziness Left Roll Test: no nystagmus and some dizziness   MOTION SENSITIVITY:  Motion Sensitivity Quotient Intensity: 0 = none, 1 = Lightheaded, 2 = Mild, 3 = Moderate, 4 = Severe, 5 = Vomiting  Intensity  1. Sitting to supine 0  2. Supine to L side   3. Supine to R side   4. Supine to sitting 3  5. L Hallpike-Dix   6. Up from L    7. R Hallpike-Dix   8. Up from R    9. Sitting, head tipped to L knee 0  10. Head up from L knee   11. Sitting, head tipped to R knee 0  12. Head up from R knee   13. Sitting head turns x5   14.Sitting head nods x5   15. In stance, 180 turn to L    16. In stance, 180 turn to R     OTHOSTATICS: not done                                                                                                                              TREATMENT  DATE: 07/05/24   Canalith Repositioning:  Epley Right: Number of Reps: 1, with repeat testing with provocation of dizziness again Gaze Adaptation:  na Habituation:  Instructed in [employer], to perform 5 reps each side, 1 x a day at bed time. Other:   PATIENT EDUCATION: Education details: POC, goals Person educated: Patient Education method: Explanation, Demonstration, Tactile cues, Verbal cues, and Handouts Education comprehension: verbalized understanding, returned demonstration, and verbal cues required  HOME EXERCISE PROGRAM:  GOALS: Goals reviewed with patient? Yes  SHORT TERM GOALS and long term goals : Target date:  2 weeks aug 5. 2025  I HEP Baseline:established brandt darhoff today Goal status: INITIAL  2.  DGI 24/24 Baseline: TBD Goal status: INITIAL  3.  DHI 20 or less Baseline: 34 Goal status: INITIAL   ASSESSMENT:  CLINICAL IMPRESSION: Patient is a 62 y.o. male who was evaluated today for physical therapy due to long standing vertigo.  Assessment today and his history indicate possible cupulothiasis, so changed his positioning treatment to brandt darhoff maneuver for home.  He is to try for several days then return next week in one week for reassessment.  He may need some manual intervention upper c spine, the stiffness may be contributing to his bouts of vertigo.  He is to have cataract surgery in 2 weeks so brief intervention window for PT for this problem.     OBJECTIVE IMPAIRMENTS: decreased balance and impaired vision/preception.   ACTIVITY LIMITATIONS: bending, transfers, bed mobility, and reach over head  PARTICIPATION LIMITATIONS: meal prep, cleaning, laundry, community activity, occupation, and yard work  PERSONAL FACTORS: Age, Behavior pattern, Profession, Time since onset of injury/illness/exacerbation, and 1-2 comorbidities: B cataracts are also affecting patient's functional outcome.   REHAB POTENTIAL: Good  CLINICAL DECISION MAKING: Evolving/moderate complexity  EVALUATION COMPLEXITY: Moderate   PLAN:  PT FREQUENCY: 1x/week  PT DURATION: 8 weeks  PLANNED INTERVENTIONS: 97110-Therapeutic exercises, 97530- Therapeutic activity, [id number]- Neuromuscular re-education, 97535- Self Care, 02859- Manual therapy, and 95992- Canalith repositioning  PLAN FOR NEXT SESSION: reassess for BPPV, assess VOR, possibly add adaptation and habituation training, possibly manual work and stretching upper cervical spine   Jakerria Kingbird L Keyonta Madrid, PT, DPT, OCS 07/05/2024, 5:02 PM

## 2024-07-07 ENCOUNTER — Ambulatory Visit

## 2024-07-10 ENCOUNTER — Other Ambulatory Visit (INDEPENDENT_AMBULATORY_CARE_PROVIDER_SITE_OTHER): Payer: Self-pay | Admitting: Internal Medicine

## 2024-07-10 DIAGNOSIS — E785 Hyperlipidemia, unspecified: Secondary | ICD-10-CM

## 2024-07-11 ENCOUNTER — Ambulatory Visit: Attending: Internal Medicine | Admitting: Pharmacist

## 2024-07-11 DIAGNOSIS — E7800 Pure hypercholesterolemia, unspecified: Secondary | ICD-10-CM | POA: Insufficient documentation

## 2024-07-11 NOTE — Assessment & Plan Note (Signed)
 Assessment: LDL-C is above goal of less than 70 We discussed his Mesa 10-year risk score of 7.8% puts him a he did not have any improvementt intermediate risk He did not have any improvement in his neuropathy off of statins He has done a lot of research on statin medications and cardiovascular risk reduction  He is not against resuming his rosuvastatin  but would like some more time to make some lifestyle changes to see if it makes any difference.  He is aware that a lot of this is genetically driven Very good discussion about nutrition, risk factors, reliable sources had  Plan: Patient will continue to work on improving his diet and incorporating resistance exercises into his training Plans to have his labs rechecked in 3 months and we will decide at that point if he would like to resume rosuvastatin 

## 2024-07-11 NOTE — Progress Notes (Signed)
 Patient ID: Curtis Luna                 DOB: 04-18-1961                    MRN: [id number]      HPI: Curtis Luna is a 63 y.o. male patient of Dr. Loni referred to lipid clinic by Mercy Hails, PA. PMH is significant for ocular migraines, nonobstructive CAD, PACs, hyperlipidemia, GERD, thyroid  nodules, hyperlipidemia.   Coronary CTA showed CAC score 213 placing him at the 79th percentile. He did not tolerate increased doses of pravastatin . Therefore switched to 20 mg Crestor . At his appointment in Feb, patient requested to stop his rosuvastatin  to see if it helped his neuropathy. LDL-C 124, non-HDL cholesterol 138 off of medication.   Patient presents today to clinic.  Reports that his neuropathy has not improved off statin.  Reviewed the labs he had with a functional medicine company.  It appears off of statin his LDL-C increased to 110 with an LDL particle around 1300.  He has made a lot of changes to his diet.  He is seeing a nutritionist.  Previously consumed a lot of sugar with his breakfast including juice, oatmeal with brown sugar and sugar in his coffee.  He has been decreasing his sugar and increasing his protein intake.  Lost about 10 pounds.  MESA 10 year risk score with CAC 7.8%  Current Medications: None Intolerances: Rosuvastatin  20 mg daily (neuropathy?), pravastatin  40 mg (joint pain) Risk Factors: CAC score LDL-C goal: <70 ApoB goal: <80  Diet: has made a lot of improvements Seeing a nutritionist 2 eggs protein shake, coffee w/ monkfruit   Exercise: walks little over miles 5 times a week  Family History:  Family History  Problem Relation Age of Onset   Lung cancer Mother 36       Deceased   Other Father        MVA   Aneurysm Sister        Brain   Healthy Brother        x2   Heart disease Paternal Grandmother    Heart attack Paternal Grandmother    Allergies Daughter        x2   Migraines Daughter        x1   Diabetes Maternal Aunt    Neuropathy Other     Colon cancer Neg Hx    Colon polyps Neg Hx    Esophageal cancer Neg Hx    Rectal cancer Neg Hx    Stomach cancer Neg Hx    Pancreatic cancer Neg Hx    Allergic rhinitis Neg Hx    Angioedema Neg Hx    Asthma Neg Hx    Atopy Neg Hx    Eczema Neg Hx    Immunodeficiency Neg Hx    Urticaria Neg Hx     Social History:  Social History   Socioeconomic History   Marital status: Married    Spouse name: Not on file   Number of children: Not on file   Years of education: Not on file   Highest education level: Associate degree: occupational, Scientist, product/process development, or vocational program  Occupational History   Occupation: Community education officer   Tobacco Use   Smoking status: Former    Current packs/day: 0.00    Types: Cigarettes, Cigars    Quit date: 10/04/1987    Years since quitting: 36.7   Smokeless tobacco: Never  Vaping Use  Vaping status: Never Used  Substance and Sexual Activity   Alcohol use: Yes    Alcohol/week: 14.0 standard drinks of alcohol    Types: 14 Standard drinks or equivalent per week    Comment: 2 daily    Drug use: No   Sexual activity: Not on file  Other Topics Concern   Not on file  Social History Narrative   1 caffeine drink daily  Lives with wife in a one story home.  Has 2 children.  Works in Actuary.   Social Drivers of Corporate investment banker Strain: Low Risk  (01/05/2024)   Overall Financial Resource Strain (CARDIA)    Difficulty of Paying Living Expenses: Not hard at all  Food Insecurity: No Food Insecurity (01/05/2024)   Hunger Vital Sign    Worried About Running Out of Food in the Last Year: Never true    Ran Out of Food in the Last Year: Never true  Transportation Needs: No Transportation Needs (01/05/2024)   PRAPARE - Administrator, Civil Service (Medical): No    Lack of Transportation (Non-Medical): No  Physical Activity: Insufficiently Active (01/05/2024)   Exercise Vital Sign    Days of Exercise per Week: 5 days     Minutes of Exercise per Session: 20 min  Stress: No Stress Concern Present (01/05/2024)   [employer] of Occupational Health - Occupational Stress Questionnaire    Feeling of Stress : Not at all  Social Connections: Moderately Integrated (01/05/2024)   Social Connection and Isolation Panel    Frequency of Communication with Friends and Family: Twice a week    Frequency of Social Gatherings with Friends and Family: Once a week    Attends Religious Services: 1 to 4 times per year    Active Member of [employer] or Organizations: No    Attends Banker Meetings: Not on file    Marital Status: Married  Intimate Partner Violence: Unknown (03/19/2022)   Received from [facility]   HITS    Physically Hurt: Not on file    Insult or Talk Down To: Not on file    Threaten Physical Harm: Not on file    Scream or Curse: Not on file     Labs: Lipid Panel      Component Value Date/Time   CHOL 189 05/19/2024 0902   TRIG 74 05/19/2024 0902   HDL 51 05/19/2024 0902   CHOLHDL 3.7 05/19/2024 0902   CHOLHDL 3 01/29/2023 0721   VLDL 15.8 01/29/2023 0721   LDLCALC 124 (H) 05/19/2024 0902   LDLDIRECT 146 (H) 10/17/2014 0930   LABVLDL 14 05/19/2024 0902   11/29/23: on rosuvastatin  20mg  daily A1c 5.8% LDL 54 HDL 48 LPA less than 10 Total cholesterol 119 Triglycerides 83 ApoB 57  Past Medical History:  Diagnosis Date   Abdominal pain, right lower quadrant    Acute bronchospasm    Allergy    Anxiety    on and off   Backache, unspecified    Chicken pox    Diverticulosis of colon (without mention of hemorrhage)    Esophageal reflux    External hemorrhoids without mention of complication    thrombosed   Hiatal hernia    Hyperlipidemia    no medicines    Inguinal hernia without mention of obstruction or gangrene, unilateral or unspecified, (not specified as recurrent)    Migraine    Neuropathy    Obstructive sleep apnea (adult) (pediatric)    uses oral  appliance   Other  symptoms involving digestive system(787.99)    Pain in joint, forearm    Pain in joint, shoulder region    Seasonal allergies    Sleep apnea    no cpap but uses dental device    Thoracic or lumbosacral neuritis or radiculitis, unspecified     Current Outpatient Medications on File Prior to Visit  Medication Sig Dispense Refill   Ascorbic Acid (VITAMIN C) 1000 MG tablet Take 1,000 mg by mouth daily.     b complex vitamins tablet Take 1 tablet by mouth daily. Every other day     cholecalciferol (VITAMIN D3) 25 MCG (1000 UT) tablet Take 1,000 Units by mouth daily.     Famotidine (PEPCID PO) Take by mouth as needed.     gabapentin  (NEURONTIN ) 100 MG capsule Take 1 capsule (100 mg total) by mouth 4 (four) times daily. 360 capsule 1   LORazepam  (ATIVAN ) 1 MG tablet Take 1 tablet (1 mg total) by mouth once daily as needed for anxiety. 30 tablet 0   Na Sulfate-K Sulfate-Mg Sulfate concentrate (SUPREP BOWEL PREP KIT) 17.5-3.13-1.6 GM/177ML SOLN Take 1 kit (354 mLs total) by mouth as directed for colonoscopy prep. 354 mL 0   No current facility-administered medications on file prior to visit.    Allergies  Allergen Reactions   Escitalopram Hives    Pt felt anxious  Pt felt his skin was on fire   Influenza Vaccines Other (See Comments)    Brain fog, dizziness, tinnitus   Protonix  [Pantoprazole  Sodium] Other (See Comments)    Tingling of extremities    Assessment/Plan:  1. Hyperlipidemia -  Hypercholesterolemia Assessment: LDL-C is above goal of less than 70 We discussed his Mesa 10-year risk score of 7.8% puts him a he did not have any improvementt intermediate risk He did not have any improvement in his neuropathy off of statins He has done a lot of research on statin medications and cardiovascular risk reduction  He is not against resuming his rosuvastatin  but would like some more time to make some lifestyle changes to see if it makes any difference.  He is aware that a lot of this  is genetically driven Very good discussion about nutrition, risk factors, reliable sources had  Plan: Patient will continue to work on improving his diet and incorporating resistance exercises into his training Plans to have his labs rechecked in 3 months and we will decide at that point if he would like to resume rosuvastatin     Thank you,  Vernisha Bacote D Ralpheal Zappone, Pharm.JONETTA SARAN, CPP [facility] of [facility] [address] [place], [place] [address]  Phone: [phone]; Fax: [phone]

## 2024-07-11 NOTE — Patient Instructions (Signed)
 SABRA

## 2024-07-11 NOTE — Addendum Note (Signed)
 Addended byBETHA CREDIT, Ailine Hefferan L on: 07/11/2024 04:17 PM   Modules accepted: Orders

## 2024-07-12 ENCOUNTER — Encounter

## 2024-07-14 ENCOUNTER — Ambulatory Visit: Admitting: Rehabilitation

## 2024-07-17 ENCOUNTER — Other Ambulatory Visit (INDEPENDENT_AMBULATORY_CARE_PROVIDER_SITE_OTHER): Payer: Self-pay | Admitting: Internal Medicine

## 2024-07-17 DIAGNOSIS — R350 Frequency of micturition: Secondary | ICD-10-CM

## 2024-07-18 ENCOUNTER — Other Ambulatory Visit (INDEPENDENT_AMBULATORY_CARE_PROVIDER_SITE_OTHER): Payer: Self-pay | Admitting: Student in an Organized Health Care Education/Training Program

## 2024-07-18 DIAGNOSIS — C679 Malignant neoplasm of bladder, unspecified: Secondary | ICD-10-CM

## 2024-08-01 ENCOUNTER — Ambulatory Visit
Payer: MEDICAID | Attending: Student in an Organized Health Care Education/Training Program | Admitting: Student in an Organized Health Care Education/Training Program

## 2024-08-01 ENCOUNTER — Other Ambulatory Visit: Payer: Self-pay

## 2024-08-01 ENCOUNTER — Encounter (INDEPENDENT_AMBULATORY_CARE_PROVIDER_SITE_OTHER): Payer: Self-pay | Admitting: Student in an Organized Health Care Education/Training Program

## 2024-08-01 DIAGNOSIS — C679 Malignant neoplasm of bladder, unspecified: Secondary | ICD-10-CM | POA: Insufficient documentation

## 2024-08-01 LAB — POC URINALYSIS (RESULTS)
BILIRUBIN: NEGATIVE mg/dL
GLUCOSE: NEGATIVE mg/dL
KETONES: NEGATIVE mg/dL
NITRITE: NEGATIVE
PH: 6.5 (ref 5.0–8.0)
PROTEIN: NEGATIVE mg/dL
SPECIFIC GRAVITY: 1.01 (ref 1.005–1.030)
UROBILINOGEN: 0.2 mg/dL

## 2024-08-01 MED ORDER — LIDOCAINE 2 % MUCOSAL JELLY IN APPLICATOR
Status: AC
Start: 2024-08-01 — End: 2024-08-01

## 2024-08-01 MED ORDER — AMOXICILLIN 875 MG-POTASSIUM CLAVULANATE 125 MG TABLET
1.0000 | ORAL_TABLET | Freq: Two times a day (BID) | ORAL | 0 refills | Status: AC
Start: 2024-08-01 — End: 2024-08-04

## 2024-08-01 NOTE — Nursing Note (Signed)
 Patient arrived for scheduled cystoscopy for DX: bladder cancer . The patient voided prior to procedure for UA. The patient was first placed in the supine position. The patient's genitalia was prepped with Betadine using sterile technique. 2% Lidocaine  gel topical local urojet given into patient's urethra. Dr. Trellis performed Cystoscopy. Patient tolerated procedure without difficulty.     Patient also advised that after the procedure, the patient may note urinary frequency, urgency, hematuria, or dysuria for a day. Increasing fluid intake helps to flush out the bladder, but caffeinated, carbonated, or alcoholic beverages should be avoided because they may irritate the bladder lining. Signs of infection, such as fever, chills, low back pain, or persistent blood in the urine, should be reported to the clinic or to go to the Emergency Room for evaluation.      Patient dressed and escorted to exam room for Physician to review plan of care with patient.  Patient to RTC as instructed.    Philippe Pitman, RN

## 2024-08-01 NOTE — Nursing Note (Signed)
 Images obtained by Dr. Mellody with Dr. Trellis during cystoscopy.      Philippe Pitman, RN

## 2024-08-01 NOTE — Procedures (Signed)
 UROLOGY[facility]YS[facility] OFFICE C[facility]  [address]  [place] [id number]  Operated by [facility]  Procedure Note    Name: Hector [name] MRN:  [id number]   Date: 08/01/2024 DOB:  [birth year] (63 y.o.)         Cysto-Diagnostic    Performed by: Trellis Sieving, MD  Authorized by: Trellis Sieving, MD    Procedure discussed: discussed risks, benefits and alternatives    Chaperone present: yes    Timeout: timeout called immediately prior to procedure    Prep: patient was prepped and draped in usual sterile fashion    Prep type: Betadine    Anesthesia: local anesthesia      Procedure Details     Cystoscope type: flexible    Cystoscopy route: transurethral      Cystoscopy location: native bladder      Irrigation used: saline      Position: supine    Urethra     Urethra: normal      Prostate     Prostate comment: Moderate enlargement    Bladder     Bladder comment: Small papillary mass, roughly 1 cm. Only visible on retroflexion. Rest of the bladder was unremarkable.    Post-Procedure Details     Outcome: patient tolerated procedure well with no complications      Disposition: discharged home in satisfactory condition       Additional Details  Will set up for TURBT.          Sieving Trellis, MD

## 2024-08-02 ENCOUNTER — Ambulatory Visit (INDEPENDENT_AMBULATORY_CARE_PROVIDER_SITE_OTHER): Payer: Self-pay | Admitting: Internal Medicine

## 2024-08-05 ENCOUNTER — Encounter (HOSPITAL_COMMUNITY): Payer: Self-pay

## 2024-08-05 ENCOUNTER — Inpatient Hospital Stay (HOSPITAL_COMMUNITY)
Admission: RE | Admit: 2024-08-05 | Discharge: 2024-08-05 | Disposition: A | Discharge status: Home / Self Care | Payer: MEDICAID | Source: Ambulatory Visit

## 2024-08-05 ENCOUNTER — Encounter (HOSPITAL_COMMUNITY): Payer: Self-pay | Admitting: Student in an Organized Health Care Education/Training Program

## 2024-08-08 ENCOUNTER — Other Ambulatory Visit: Payer: Self-pay

## 2024-08-08 ENCOUNTER — Ambulatory Visit (HOSPITAL_BASED_OUTPATIENT_CLINIC_OR_DEPARTMENT_OTHER): Payer: Self-pay | Admitting: Family

## 2024-08-08 ENCOUNTER — Other Ambulatory Visit (INDEPENDENT_AMBULATORY_CARE_PROVIDER_SITE_OTHER): Payer: Self-pay | Admitting: Nurse Practitioner

## 2024-08-08 DIAGNOSIS — J449 Chronic obstructive pulmonary disease, unspecified: Secondary | ICD-10-CM

## 2024-08-08 NOTE — H&P (Deleted)
 Addendum:  none      [facility]  Department of Anesthesiology  Optimization Visit  History and Physical     Hector, [name], 63 y.o. male Medical Record Number: [id number]   Date of Birth:  1961-01-10 Date of Service:  08/08/2024    Information Obtained from: patient Surgeon:  Surgeon(s):  Trellis Sieving, MD    Date of Surgery: 08/18/2024  Planned anesthesia: General   Estimated Case Length: 24 Min     TELEMEDICINE DOCUMENTATION:    Patient Location:  MyChart video visit from home address: [address]  [place] [address]    Patient/family aware of provider location:  yes  Patient/family consent for telemedicine:  yes  Examination observed and performed by:  Hector KATHEE Sprawls, APRN,FNP-BC        CHIEF COMPLAINT  Pre-Operative Optimization Visit for RESECTION TUMOR BLADDER TRANSURETHRAL: 52240 (CPT)   due to Bladder mass [N32.89].        RECOMMENDATION  SUMMARY: The surgery is considered time sensitive [Surgery within a few days to weeks].  The surgery is considered low risk .  Recommend  proceeding towards surgery.    Patient has the following outstanding needs for optimization:   None  Patient would benefit from active incentive spirometer, early ambulation following surgery and multi-modal pain management approach as appropriate.  DVT prophylaxis as appropriate.  Please see addendum for any updates after 08/08/2024.        ASSESSMENT & PLAN  1.) Preop Exam  Plan: Proceed with surgery as scheduled.     2.) Bladder Tumor  Assessment/Plan:  -Cysto 08/01/2024 revealed small papillary mass, roughly 1cm.   -Scheduled for TURBT in the near future.   -Proceed with surgery as scheduled. Preop plan as above.     3.)     MEDICATION INSTRUCTIONS    Take the following medications the morning of surgery:     Hold the following medications the day of surgery:     Hold NSAIDs (Motrin, Advil, Aleve, Ibuprofen, Naprosyn), vitamins, fish oil, and herbal supplements 7 days prior to the procedure.          HPI  Hector [name] is a 63 y.o. male that presents with need for pre-operative optimization.  ***      Operative Reference Anesthesia  ASA  {ASA Class:44993}    METS  {METS:44994}    History of difficult intubation {Yes/No:21034203}    Personal or family history of anesthesia problems {Yes/No:21034203}    OSA  {Sleep Apnea Compliance:21040503}      Operative Reference Surgery  Allergy to the following: latex, metal/nickel, iodine /shellfish, acrylic, egg/propofol , all opioids {Yes/No:21034203}    Recent Hospitalization (within the last 3 months) {Yes/No:21034203}    Current Tobacco Use  {Yes/No-Amount:21040504}    Current Alcohol Use {Yes/No-Amount:21040504}    Current Drug Use  {Yes/No-Amount:21040504}      Assessment Tools   Hx of Lung Disease   {Yes/NoAriscat:47844}   Concern for Cardiac Ischemia {Yes/No2:47846}   History of PE/DVT and/or Clotting Disorder {yes/nocaprini:47845}   History of Liver Disease {yes/novocal:47847}   Frailty Score {yes/nofrail:47848}    Reference:     []  F= Fatigue in the past month? (More unusual fatigue than normal or inability to complete ADLs)  []  R= Resistance- difficulty climbing a flight of stairs?   []  A= Ambulation- difficulty walking one block on level land?  []   I= Illness (HTN, DM, Cancer (other than minor skin CA), Chronic Lung Disease, Heart Attack, CHF,  Angina, Asthma, Arthritis, Stroke, CKD); 5+=1 point; <5=0  []   L= Loss of weight- have you lost >5% of your body weight in the past year?   AD8 {ji1:51902}     STOP-BANG:       08/26/2021    11:49 AM 09/30/2022    10:05 AM 09/30/2022    10:26 AM 06/22/2023    10:28 AM 10/29/2023     9:23 AM   Stop Bang   Have you been Diagnosed with SLEEP APNEA (if yes do not need to continue Stop Bang) No No  No No   SNORING - do you snore loudly? (louder than talking or loud enough to be heard through closed doors) 1  1 1     TIRED - do you often feel tired, fatigued, or sleepy during daytime? 0  1 1    OBSERVED - has anyone observed you stop breathing  during your sleep? 0  0 0    BLOOD PRESSURE - Do you have or are you being treated for high blood pressure? 0  1 1    BMI - greater than 35 kg/m2 ? 0 kg/m2  1 kg/m2 1 kg/m2    AGE - over 20 yr old? 1  1 1     NECK CIRCUMFERENCE - greater than 40 cm? 1  0 1    GENDER - Male? 1  1 1     STOP score 1  3 3     BANG score 3  3 4     STOP BANG score 4  6 7     Stop Bang Risk High Risk  High Risk High Risk             {Please consider pain team referral for any patient methadone or Suboxone or MME of > 60/day.:123}    {Please consider reviewing these pertinent diagnoses during PEC appt  Pulm: Recent URI, Asthma, COPD, OSA  Cardiac: CAD, MI, CHF, HTN, HLD, Valvular Disorders, Dysrhythmia, Implantable Device  Neuro: Seizure, CVA, TIA  Vascular: Carotid Artery Disease, Aneurysm  GI: Hepatitis, Cirrhosis, GERD  GU: Kidney disease  Endocrine: Diabetes, Thyroid  disorder  Heme/Immune: Anemia, Bleeding or Clotting disorder, Cancer, History of VTE:123}    Past Medical History:  Past Medical History:   Diagnosis Date    Alcohol abuse     Bladder cancer     Cancer (CMS HCC)     Carotid stenosis     right side stent    Chronic obstructive airway disease     Cirrhosis     Closed fracture of transverse process of thoracic vertebra 05/01/2021    Coma     after fall due to alcholism May 2022    Delirium, withdrawal, alcoholic (CMS HCC)     Depression     Encephalopathy 05/15/2021    Esophageal reflux     H/O urinary tract infection     8/18 per pt    Heart murmur     HTN (hypertension)     Hyperlipidemia     Ileus (CMS HCC) 05/18/2021    Left pulmonary contusion 05/01/2021    Leukopenia 05/18/2021    Multiple rib fractures 05/01/2021    Oxygen  dependent     Pleural effusion 05/11/2021    Serum ammonia increased (CMS HCC) 05/15/2021    Shortness of breath     Spleen injury 05/01/2021    Status post thoracentesis 05/19/2021    Thrombocytopenia (CMS HCC) 05/01/2021    Unknown cause of injury  fx nose     Past Surgical History:   Procedure  Laterality Date    CAROTID STENT      COLONOSCOPY  08/2021    ESOPHAGOGASTRODUODENOSCOPY  08/2021    HX HEART CATHETERIZATION      HX OTHER Right 06/29/2023    TCAR    HX TONSILLECTOMY      OTHER SURGICAL HISTORY      HX of plastic surgery to right head     Family Medical History:       Problem Relation (Age of Onset)    Asthma Father    Cerebral Aneurysm Sister    Dementia Mother    Heart Attack Paternal Grandmother (50)                Review of Systems:  <ROSPATIENTANESTHESIA>    Exam:  There were no vitals taken for this visit.      There is no height or weight on file to calculate BMI.           Diagnostics:     EKG: ***    Chest XRAY: ***    Labs:  ***    Other Relevant Diagnostics: ***      Diagnostics included will be reviewed  and followed up as determined necessary prior to procedure         Hector KATHEE Sprawls, APRN,FNP-BC  08/08/2024, 10:54    Perioperative Evaluation Center  [place]  [facility]  [place]    It should be emphasized that the [facility] Provider consultation is to assess the patient's medical conditions and provide recommendations to optimize the patient to decrease peri-operative complications.  Decision on whether or not to proceed to surgery is made at the care team's discretion.

## 2024-08-09 ENCOUNTER — Other Ambulatory Visit: Payer: Self-pay

## 2024-08-10 ENCOUNTER — Other Ambulatory Visit: Payer: Self-pay

## 2024-08-10 ENCOUNTER — Ambulatory Visit: Payer: MEDICAID | Attending: Family | Admitting: Family

## 2024-08-10 ENCOUNTER — Encounter (INDEPENDENT_AMBULATORY_CARE_PROVIDER_SITE_OTHER): Payer: Self-pay | Admitting: Internal Medicine

## 2024-08-10 ENCOUNTER — Ambulatory Visit (HOSPITAL_COMMUNITY): Payer: MEDICAID

## 2024-08-10 ENCOUNTER — Encounter (HOSPITAL_BASED_OUTPATIENT_CLINIC_OR_DEPARTMENT_OTHER): Payer: Self-pay | Admitting: Family

## 2024-08-10 ENCOUNTER — Ambulatory Visit: Payer: MEDICAID | Attending: Internal Medicine | Admitting: Internal Medicine

## 2024-08-10 VITALS — BP 130/70 | HR 57 | Ht 68.0 in | Wt 191.0 lb

## 2024-08-10 DIAGNOSIS — Z01818 Encounter for other preprocedural examination: Secondary | ICD-10-CM

## 2024-08-10 DIAGNOSIS — J449 Chronic obstructive pulmonary disease, unspecified: Secondary | ICD-10-CM

## 2024-08-10 DIAGNOSIS — I272 Pulmonary hypertension, unspecified: Secondary | ICD-10-CM

## 2024-08-10 DIAGNOSIS — K219 Gastro-esophageal reflux disease without esophagitis: Secondary | ICD-10-CM

## 2024-08-10 DIAGNOSIS — E785 Hyperlipidemia, unspecified: Secondary | ICD-10-CM | POA: Insufficient documentation

## 2024-08-10 DIAGNOSIS — F17200 Nicotine dependence, unspecified, uncomplicated: Secondary | ICD-10-CM

## 2024-08-10 DIAGNOSIS — K7460 Unspecified cirrhosis of liver: Secondary | ICD-10-CM

## 2024-08-10 DIAGNOSIS — I6529 Occlusion and stenosis of unspecified carotid artery: Secondary | ICD-10-CM

## 2024-08-10 DIAGNOSIS — R609 Edema, unspecified: Secondary | ICD-10-CM | POA: Insufficient documentation

## 2024-08-10 DIAGNOSIS — C679 Malignant neoplasm of bladder, unspecified: Secondary | ICD-10-CM | POA: Insufficient documentation

## 2024-08-10 DIAGNOSIS — I2510 Atherosclerotic heart disease of native coronary artery without angina pectoris: Secondary | ICD-10-CM

## 2024-08-10 DIAGNOSIS — D494 Neoplasm of unspecified behavior of bladder: Secondary | ICD-10-CM

## 2024-08-10 DIAGNOSIS — F17210 Nicotine dependence, cigarettes, uncomplicated: Secondary | ICD-10-CM

## 2024-08-10 DIAGNOSIS — F1290 Cannabis use, unspecified, uncomplicated: Secondary | ICD-10-CM

## 2024-08-10 DIAGNOSIS — D649 Anemia, unspecified: Secondary | ICD-10-CM

## 2024-08-10 LAB — CBC
HCT: 26.7 % — ABNORMAL LOW (ref 36.0–46.0)
HGB: 8.9 g/dL — ABNORMAL LOW (ref 13.9–16.3)
MCH: 28 pg (ref 25.4–34.0)
MCHC: 33.4 g/dL (ref 30.0–37.0)
MCV: 83.9 fL (ref 80.0–100.0)
MPV: 9.1 fL (ref 7.5–11.5)
PLATELETS: 76 x10ˆ3/uL — ABNORMAL LOW (ref 130–400)
RBC: 3.18 x10ˆ6/uL — ABNORMAL LOW (ref 4.30–5.90)
RDW: 24.4 % — ABNORMAL HIGH (ref 11.5–14.0)
WBC: 2.1 x10ˆ3/uL — ABNORMAL LOW (ref 4.5–11.5)

## 2024-08-10 LAB — COMPREHENSIVE METABOLIC PANEL, NON-FASTING
ALBUMIN/GLOBULIN RATIO: 0.9 — ABNORMAL LOW (ref 1.5–2.5)
ALBUMIN: 2.9 g/dL — ABNORMAL LOW (ref 3.5–5.0)
ALKALINE PHOSPHATASE: 148 U/L — ABNORMAL HIGH (ref 38–126)
ALT (SGPT): 19 U/L (ref <50)
ANION GAP: 7 mmol/L (ref 5–19)
AST (SGOT): 47 U/L (ref 17–59)
BILIRUBIN TOTAL: 0.6 mg/dL (ref 0.2–1.3)
BUN/CREA RATIO: 13 (ref 6–20)
BUN: 14 mg/dL (ref 9–20)
CALCIUM: 8.1 mg/dL — ABNORMAL LOW (ref 8.4–10.2)
CHLORIDE: 110 mmol/L — ABNORMAL HIGH (ref 98–107)
CO2 TOTAL: 21 mmol/L — ABNORMAL LOW (ref 22–30)
CREATININE: 1.12 mg/dL (ref 0.66–1.20)
ESTIMATED GFR: >60 mL/min/1.73mˆ2 (ref >60)
GLUCOSE: 81 mg/dL (ref 74–106)
POTASSIUM: 4 mmol/L (ref 3.5–5.1)
PROTEIN TOTAL: 6.2 g/dL — ABNORMAL LOW (ref 6.3–8.2)
SODIUM: 138 mmol/L (ref 137–145)

## 2024-08-10 LAB — NT-PROBNP: NT-PROBNP: 1190 pg/mL — ABNORMAL HIGH (ref <=125)

## 2024-08-10 MED ORDER — ROSUVASTATIN 10 MG TABLET
10.0000 mg | ORAL_TABLET | Freq: Every evening | ORAL | 1 refills | Status: AC
Start: 2024-08-10 — End: ?

## 2024-08-10 MED ORDER — FUROSEMIDE 20 MG TABLET
20.0000 mg | ORAL_TABLET | Freq: Every day | ORAL | 2 refills | Status: DC
Start: 2024-08-10 — End: 2024-10-12

## 2024-08-10 NOTE — Assessment & Plan Note (Signed)
 Patient has cirrhosis splenomegaly and thrombocytopenia secondary to that he is not keeping up with the Gastroenterology I stressed the need for him to have a regular follow up

## 2024-08-10 NOTE — Patient Instructions (Signed)
 Take lasix  daily     Get labs     See  your gastroenterologist

## 2024-08-10 NOTE — Progress Notes (Signed)
 INTERNAL MEDICINE, [facility]  [address]  [place] [address]  Operated by [facility]  Progress Note    Name: Hector [name] MRN:  [id number]   Date: 08/10/2024 DOB:  1961-07-08 (63 y.o.)             Reason for Visit: Follow Up 3 Months (Patient here for a 3 month follow up - needing refill on Crestor  - swollen right foot, states left swells some too but not today- patient has ankle welling as well - fell a couple of weeks ago in his kitchen - has marks on right side on stomach)    Subjective  Hector [name] is a 63 y.o. year old male who comes to clinic follow up on his chronic problems also complains of lower extremity edema gradual onset also increasing shortness for breath as we know he has Chronic Obstructive Pulmonary Disease he is on oxygen  supposed to be 24 hours but not wear while he is here.      He is scheduled to have a procedure done for his bladder mass.  He has preoperative testing ordered    He denies any blood in stool or black stool no nausea vomiting diarrhea.    We will continues to smoke  Current Outpatient Medications   Medication Sig    albuterol  sulfate (VENTOLIN  HFA) 90 mcg/actuation Inhalation oral inhaler Take 2 Puffs by inhalation Every 6 hours as needed for Other (SHORTNESS OF BREATH,COUGH OR WHEEZING)    fluticasone  propion-salmeteroL (ADVAIR  HFA) 230-21 mcg/actuation Inhalation oral inhaler Take 2 Puffs by inhalation Twice daily RINSE MOUTH WITH WATER  AFTER INHALER USE    furosemide  (LASIX ) 20 mg Oral Tablet Take 1 Tablet (20 mg total) by mouth Daily for 90 days    ipratropium-albuterol  0.5 mg-3 mg(2.5 mg base)/3 mL Solution for Nebulization TAKE 3 ML BY NEBULIZATION THREE TIMES A DAY AS NEEDED FOR WHEEZING    OLANZapine  (ZYPREXA ) 10 mg Oral Tablet Take 1 Tablet (10 mg total) by mouth Every night    oxygen  (O2) Inhalation gas Administer 4 L/min into affected nostril(s) continuous    pantoprazole  (PROTONIX ) 40 mg Oral Tablet, Delayed Release (E.C.) TAKE 1 TABLET (40 MG TOTAL)  BY MOUTH TWICE A DAY 30 MINTUTES BEFORE MEALS FOR 90 DAYS    rosuvastatin  (CRESTOR ) 10 mg Oral Tablet Take 1 Tablet (10 mg total) by mouth Every evening    sertraline  (ZOLOFT ) 100 mg Oral Tablet Take 1 Tablet (100 mg total) by mouth Daily Pm    tamsulosin  (FLOMAX ) 0.4 mg Oral Capsule TAKE 1 CAPSULE (0.4 MG TOTAL) BY MOUTH EVERY EVENING AFTER DINNER (Patient not taking: Reported on 08/05/2024)    traZODone  (DESYREL ) 50 mg Oral Tablet Take 1 Tablet (50 mg total) by mouth Every night Unsure dosage       Objective   BP 130/70 (Site: Right Arm, Patient Position: Sitting)   Pulse 57   Ht 1.727 m (5' 8)   Wt 86.6 kg (191 lb)   SpO2 96%   BMI 29.04 kg/m       General:  appears chronically ill  Neck:  no thyromegaly or lymphadenopathy  Lungs:  Breathing nonlabored, decreased breath sounds bibasilar, rales bibasilar  Cardiovascular:  regular rate and rhythm, S1, S2 normal, no murmur, click, rub or gallop  Abdomen:  non-distended, Soft, non-tender, Bowel sounds normal  Extremities:  pedal edema 2+ right > left    Data Reviewed  Lab Results   Component Value Date    CHOLESTEROL  161 04/13/2023    HDLCHOL 32 (L) 04/13/2023    LDLCHOL 114 (H) 04/13/2023    TRIG 74 04/13/2023      BASIC METABOLIC PANEL  Lab Results   Component Value Date    SODIUM 137 02/29/2024    POTASSIUM 4.3 02/29/2024    CHLORIDE 110 02/29/2024    CO2 23 02/29/2024    ANIONGAP 4 02/29/2024    BUN 12 02/29/2024    CREATININE 0.90 02/29/2024    BUNCRRATIO 13 02/29/2024    GFR >90 02/29/2024    CALCIUM 8.1 (L) 02/29/2024    GLUCOSE neg 06/29/2024    GLUCOSENF 111 (H) 02/27/2024      Lab Results   Component Value Date    AST 32 02/26/2024    ALT 24 02/26/2024     CBC  Diff   Lab Results   Component Value Date/Time    WBC 3.8 02/29/2024 03:38 AM    HGB 8.1 (L) 02/29/2024 03:38 AM    HCT 23.8 (L) 02/29/2024 03:38 AM    PLTCNT 57 (L) 02/29/2024 03:38 AM    RBC 2.56 (L) 02/29/2024 03:38 AM    MCV 93.0 02/29/2024 03:38 AM    MCHC 34.0 02/29/2024 03:38 AM    MCH  31.6 02/29/2024 03:38 AM    RDW 17.0 (H) 02/28/2024 07:33 AM    MPV 11.1 02/29/2024 03:38 AM    Lab Results   Component Value Date/Time    PMNS 60.6 02/29/2024 03:38 AM    LYMPHOCYTES 24 02/27/2024 04:12 AM    EOSINOPHIL 2 02/27/2024 04:12 AM    MONOCYTES 10.1 02/29/2024 03:38 AM    BASOPHILS 0.8 02/29/2024 03:38 AM    BASOPHILS <0.10 02/29/2024 03:38 AM    PMNABS 2.28 02/29/2024 03:38 AM    LYMPHSABS 0.98 (L) 02/29/2024 03:38 AM    EOSABS <0.10 02/29/2024 03:38 AM    MONOSABS 0.38 02/29/2024 03:38 AM          Assessment and Plan  Problem List Items Addressed This Visit          Respiratory    Chronic obstructive pulmonary disease (Chronic)       Nephrology    Bladder cancer    Follow up with Urology as scheduled            Digestive    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         Relevant Orders    NT-PROBNP     Other Visit Diagnoses         Hepatic cirrhosis, unspecified hepatic cirrhosis type, unspecified whether ascites present (CMS [facility])    -  Primary    Relevant Orders    BASIC METABOLIC PANEL    Referral to GASTROENTEROLOGY - [facility] -  [place]          Labs ordered all medication reviewed and prescription sent advised to follow up with gastroenterology and his cardiologist I will review his labs .  Patient to call me if there is any increased shortness for breath or increased  swelling.    Advised to stay on a very low-salt diet checked weight daily if possible    Tomasa JONELLE Hoar, MD

## 2024-08-10 NOTE — Assessment & Plan Note (Signed)
 Follow up with Urology as scheduled.

## 2024-08-10 NOTE — H&P (Addendum)
 Addendum:    CBC  Diff   Lab Results   Component Value Date/Time    WBC 2.1 (L) 08/10/2024 04:14 PM    HGB 8.9 (L) 08/10/2024 04:14 PM    HCT 26.7 (L) 08/10/2024 04:14 PM    PLTCNT 76 (L) 08/10/2024 04:14 PM    RBC 3.18 (L) 08/10/2024 04:14 PM    MCV 83.9 08/10/2024 04:14 PM    MCHC 33.4 08/10/2024 04:14 PM    MCH 28.0 08/10/2024 04:14 PM    RDW 24.4 (H) 08/10/2024 04:14 PM    MPV 9.1 08/10/2024 04:14 PM    Lab Results   Component Value Date/Time    PMNS 60.6 02/29/2024 03:38 AM    LYMPHOCYTES 24 02/27/2024 04:12 AM    EOSINOPHIL 2 02/27/2024 04:12 AM    MONOCYTES 10.1 02/29/2024 03:38 AM    BASOPHILS 0.8 02/29/2024 03:38 AM    BASOPHILS <0.10 02/29/2024 03:38 AM    PMNABS 2.28 02/29/2024 03:38 AM    LYMPHSABS 0.98 (L) 02/29/2024 03:38 AM    EOSABS <0.10 02/29/2024 03:38 AM    MONOSABS 0.38 02/29/2024 03:38 AM        COMPREHENSIVE METABOLIC PANEL  Lab Results   Component Value Date    SODIUM 138 08/10/2024    POTASSIUM 4.0 08/10/2024    CHLORIDE 110 (H) 08/10/2024    CO2 21 (L) 08/10/2024    ANIONGAP 7 08/10/2024    BUN 14 08/10/2024    CREATININE 1.12 08/10/2024    GLUCOSENF 81 08/10/2024    GLUCOSE neg 06/29/2024    CALCIUM 8.1 (L) 08/10/2024    PHOSPHORUS 2.6 01/06/2024    ALBUMIN  2.9 (L) 08/10/2024    TOTALPROTEIN 6.2 (L) 08/10/2024    ALKPHOS 148 (H) 08/10/2024    AST 47 08/10/2024    ALT 19 08/10/2024    GFR >60 08/10/2024        Latest Reference Range & Units 08/10/24 16:14   NT-PROBNP <=125 pg/mL 1,190 (H)   (H): Data is abnormally high          [facility]  Department of Anesthesiology  Optimization Visit  History and Physical     Hector, [name], 63 y.o. male Medical Record Number: [id number]   Date of Birth:  22-Jun-1961 Date of Service:  08/10/2024    Information Obtained from: patient Surgeon:  Surgeon(s):  Hector Sieving, MD    Date of Surgery: 08/18/2024  Planned anesthesia: General   Estimated Case Length: 76 Min     TELEMEDICINE DOCUMENTATION:    Patient Location:  MyChart video  visit from home address: [address]  [place] [address]    Patient/family aware of provider location:  no  Patient/family consent for telemedicine:  no  Examination observed and performed by:  Hector KATHEE Sprawls, APRN,FNP-BC        CHIEF COMPLAINT  Pre-Operative Optimization Visit for RESECTION TUMOR BLADDER TRANSURETHRAL: 52240 (CPT)   due to Bladder mass [N32.89].        RECOMMENDATION  SUMMARY: The surgery is considered elective.  The surgery is considered low risk .  Recommend  proceeding towards surgery.    Patient has the following outstanding needs for optimization:   None  Patient would benefit from active incentive spirometer, early ambulation following surgery and multi-modal pain management approach as appropriate.  DVT prophylaxis as appropriate.  Please see addendum for any updates after 08/10/2024.        ASSESSMENT & PLAN   1.) Preop Exam  Plan: Proceed with surgery as scheduled. LABS ordered to be completed prior to surgery. Pt states he will complete at [facility] this week.     Patient is chronically ill with multiple co-morbidities that are not well optimized. He was not a good candidate for telemedicine preop visit due to not being able to complete an appropriate physical exam, however, pt was unable to come to [facility] for an in person visit due to transportation issues. Decision to proceed with surgery will be deferred to day of surgery attending based off of in person assessment of patient.     EKG 01/05/2024 reviewed   Normal sinus rhythm   with 1st degree AV block   with occasional premature atrial complexes   Poor R wave progression     ECHO 05/26/2023  Conclusions:  Normal left ventricular size.  Mild left ventricular hypertrophy.  Left ventricular systolic function is normal.  The left ventricular ejection fraction by visual assessment is estimated to be 55-60%.  Left ventricular diastolic parameters are normal.  Trace tricuspid regurgitation present.    Carotid Duplex  01/28/2024  Conclusions: There is no evidence of a hemodynamically significant stenosis in the bilateral internal carotid arteries. Widely patent right ICA stent.     CT CAP 01/05/2024  IMPRESSION:  MILD FULLNESS OF THE RIGHT URETER AND MILD RIGHT HYDRONEPHROSIS. THERE IS SOFT TISSUE DENSITY NEAR THE RIGHT URETEROVESICULAR JUNCTION WHICH COULD BE SOFT TISSUE MASS VERSUS BLOOD CLOT. UROLOGY FOLLOW-UP IS RECOMMENDED.     MODERATE EMPHYSEMA. NO ACUTE FINDINGS IN THE CHEST.     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 NO ASCITES.       2.) Bladder Tumor   Assessment/Plan:   -Pt was admitted to the hospital in February 2025 for hematuria and found to have a suspicious bladder mass on imaging. He was dx with high risk High volume non muscle invasive bladder cancer. He underwent TURBT in March 2025.   -Cysto 08/01/2024 revealed small papillary mass, roughly 1cm.   -Scheduled for TURBT in the near future.   -Proceed with surgery as scheduled. Preop plan as above.     3.) CAD   Assessment/ Plan:   -Patient denies chest pain and/or new shortness of breath. Does report DOE that is chronic and unchanged. Denies worsening symptoms.    -CT chest 01/05/2024 notes that coronary artery calcifications are present, but no coronary calcium score is given  -ECHO 05/26/2023.   -BMP ordered.   -Continue Crestor , aspirin , plavix .   -Follow up with Cardiology as scheduled.      4.) COPD   Assessment/ Plan:   -Denies recent URI or pneumonia. Reports DOE that is chronic and unchanged. Denies worsening symptoms.    -Lungs not assessed due to telemed visit.    -4L NC at night and PRN  -CT CAP reviewed 12/2023  -Continue albuterol , Advair , and duonebs.   -Based on the ARISCAT Score, Patient is at low risk for post operative pulmonary complications.   -Advise early incentive spirometry and early mobilization to reduce atelectasis and pulmonary infection.   -Continue monitoring/management as directed by pulmonary.     5.)  Carotid Stenosis   Assessment/Plan:   -s/p right carotid stent placement 06/2023   -Last carotid duplex 01/28/2024 showed no evidence of hemodynamically significant stenosis to bilateral ICA as with widely patent right ICA stent   -Patient denies syncope, dizziness and stroke like symptoms.   -Follows with vascular surgery.   -Continue Crestor , aspirin , and Plavix .  6.) Marijuana Use  Assessment/Plan:   -Patient reports daily recreational use.   -Patient instructed to not use any recreational marijuana for 24 hours prior to procedure.     7.) Pulmonary HTN   Assessment/ Plan:   -Denies worsening SOB, syncope, chest pain, palpitations.   -Last ECHO 05/26/2023 showed mild pulmonary HTN.   -No RHC on file.   -Possible etiology: Lung disease.    -EKG reviewed.   -CT CAP 12/2023 reviewed.    -Follows with cardiology/pcp.     8.) Cirrhosis   Assessment/Plan:   -Stable. Denies ascites, jaundice, edema, or previous paracentesis   -Reports intermittent abdominal pain,but unsure if related to cirrhosis.   -Following with GI, but reports he has not seen them for awhile.   -Liver US  completed 04/12/2024 reports Normal RUQ ultrasound  -Unable to appropriately visualize patient for exam as video quality is poor  -CMP ordered.   -Continue to follow with GI as scheduled.     9.) Anemia/Thrombocytopenia   Assessment/Plan   - Last H/H: 8.1/23.8 and PLT 57 02/29/2024.   - Currently prescribed: no medication.   - Reports no excessive bleeding, easy bruising or new fatigue.    - Denies fatigue, dizziness/lightheadedness, melena.   - CBC ordered   -Recommend continued monitoring/management as directed by pcp. Recommend continued follow up with GI as well as anemia/thrombocytopenia likely related to liver disorder.     10.) GERD  Assessment/ Plan:   -Stable on protonix .   -Continue diet modifications.   -Follow up with PCP as scheduled.    11.) Smoker  Assessment/ Plan:   -Patient reports 1 ppd. Smoked for 50 years.   -States he is not  interested in quitting at this time.   -Educated to hold a minimum of 8 hours prior to procedure.   -Encouragement given for smoking cessation.        MEDICATION INSTRUCTIONS    Take the following medications the morning of surgery: inhalers, nebulizers, Protonix , zoloft     Hold NSAIDs (Motrin, Advil, Aleve, Ibuprofen, Naprosyn), vitamins, fish oil, and herbal supplements 7 days prior to the procedure.          HPI  Hector [name] is a 63 y.o. male that presents with need for pre-operative optimization.      Patient has hx of bladder cancer s/p TURBT. Cysto 08/01/2024 revealed small papillary mass, roughly 1cm. He is scheduled for repeat TURBT in the near future.       Operative Reference Anesthesia  ASA  3    METS  <4    History of difficult intubation No    Personal or family history of anesthesia problems No    OSA  No      Operative Reference Surgery  Allergy to the following: latex, metal/nickel, iodine /shellfish, acrylic, egg/propofol , all opioids No    Recent Hospitalization (within the last 3 months) No    Current Tobacco Use  Yes 1ppd x 50 years, not interested in quitting at this time     Current Alcohol Use No - quit several years ago    Current Drug Use  Marijuana       Assessment Tools   Hx of Lung Disease   22 points  ARISCAT Score  Low risk  1.6% risk of in-hospital post-op pulmonary complications (composite including respiratory failure, respiratory infection, pleural effusion, atelectasis, pneumothorax, bronchospasm, aspiration pneumonitis)   Concern for Cardiac Ischemia 0 points  RCRI Score  0.5 %  Risk of major  cardiac event   History of PE/DVT and/or Clotting Disorder No   Frailty Score Yes: Frailty Score 2    Reference:     []  F= Fatigue in the past month? (More unusual fatigue than normal or inability to complete ADLs)  [x]  R= Resistance- difficulty climbing a flight of stairs?   [x]  A= Ambulation- difficulty walking one block on level land?  []   I= Illness (HTN, DM, Cancer (other than minor  skin CA), Chronic Lung Disease, Heart Attack, CHF, Angina, Asthma, Arthritis, Stroke, CKD); 5+=1 point; <5=0  []   L= Loss of weight- have you lost >5% of your body weight in the past year?     STOP-BANG:       08/26/2021    11:49 AM 09/30/2022    10:05 AM 09/30/2022    10:26 AM 06/22/2023    10:28 AM 10/29/2023     9:23 AM   Stop Bang   Have you been Diagnosed with SLEEP APNEA (if yes do not need to continue Stop Bang) No No  No No   SNORING - do you snore loudly? (louder than talking or loud enough to be heard through closed doors) 1  1 1     TIRED - do you often feel tired, fatigued, or sleepy during daytime? 0  1 1    OBSERVED - has anyone observed you stop breathing during your sleep? 0  0 0    BLOOD PRESSURE - Do you have or are you being treated for high blood pressure? 0  1 1    BMI - greater than 35 kg/m2 ? 0 kg/m2  1 kg/m2 1 kg/m2    AGE - over 74 yr old? 1  1 1     NECK CIRCUMFERENCE - greater than 40 cm? 1  0 1    GENDER - Male? 1  1 1     STOP score 1  3 3     BANG score 3  3 4     STOP BANG score 4  6 7     Stop Bang Risk High Risk  High Risk High Risk            Past Medical History:  Past Medical History:   Diagnosis Date    Alcohol abuse     Bladder cancer     Cancer (CMS HCC)     Carotid stenosis     right side stent    Chronic obstructive airway disease     Cirrhosis     Closed fracture of transverse process of thoracic vertebra 05/01/2021    Coma     after fall due to alcholism May 2022    Delirium, withdrawal, alcoholic (CMS HCC)     Depression     Encephalopathy 05/15/2021    Esophageal reflux     H/O urinary tract infection     8/18 per pt    Heart murmur     HTN (hypertension)     Hyperlipidemia     Ileus (CMS HCC) 05/18/2021    Left pulmonary contusion 05/01/2021    Leukopenia 05/18/2021    Multiple rib fractures 05/01/2021    Oxygen  dependent     Pleural effusion 05/11/2021    Serum ammonia increased (CMS HCC) 05/15/2021    Shortness of breath     Spleen injury 05/01/2021    Status post thoracentesis  05/19/2021    Thrombocytopenia (CMS HCC) 05/01/2021    Unknown cause of injury     fx nose  Past Surgical History:   Procedure Laterality Date    CAROTID STENT      COLONOSCOPY  08/2021    ESOPHAGOGASTRODUODENOSCOPY  08/2021    HX HEART CATHETERIZATION      HX OTHER Right 06/29/2023    TCAR    HX TONSILLECTOMY      OTHER SURGICAL HISTORY      HX of plastic surgery to right head     Family Medical History:       Problem Relation (Age of Onset)    Asthma Father    Cerebral Aneurysm Sister    Dementia Mother    Heart Attack Paternal Grandmother (24)            Review of Systems:  Review of Systems   Constitutional: Negative for fever (last 7 days), chills (last 7 days) and fatigue.   Skin:  Negative for rash and open wound.   HENT: Negative for dental problems, hearing loss and difficulty swallowing.   Exercise Tolerance: Positive for less than 4 METS.   Cardiovascular:  Negative for chest pain, edema and palpitations.   Respiratory:  Positive for oxygen  dependent (4L NC at night and prn) and shortness of breath (with exertion). Negative for shortness of breath (at rest).    Gastrointestinal:  Positive for heartburn. Negative for abdominal pain, diarrhea and constipation.   Genitourinary:  Positive for urgency and frequency.   Musculoskeletal:  Positive for neck pain and back pain.   Family Anesthesia History: Negative for malignant hyperthermia precautions and pseudocholinesterase deficiency.   Anesthesia: Negative for difficult intubation and PONV.   Neurological:  Positive for dizziness, headaches and muscle weakness (generalized weakness). Negative for numbness.   Psychiatric/Behavioral:  Positive for depression and physiological symptoms of anxiety.        Exam:  There were no vitals taken for this visit.  There is no height or weight on file to calculate BMI.             Hector KATHEE Sprawls, APRN,FNP-BC  08/10/2024, 09:57    Perioperative Evaluation Center  [place]  [facility]  [place]    It  should be emphasized that the [facility] Provider consultation is to assess the patient's medical conditions and provide recommendations to optimize the patient to decrease peri-operative complications.  Decision on whether or not to proceed to surgery is made at the care team's discretion.

## 2024-08-10 NOTE — Assessment & Plan Note (Signed)
 Lower extremity swelling probably secondary to volume overload does not look like he has a ascites at this time it might be Chronic Obstructive Pulmonary Disease with going into mild CHF treat empirically with the Lasix  also allowed pro BNP he has preoperative lab ordered.

## 2024-08-11 ENCOUNTER — Ambulatory Visit (INDEPENDENT_AMBULATORY_CARE_PROVIDER_SITE_OTHER): Payer: Self-pay | Admitting: Internal Medicine

## 2024-08-11 ENCOUNTER — Other Ambulatory Visit (INDEPENDENT_AMBULATORY_CARE_PROVIDER_SITE_OTHER): Payer: Self-pay | Admitting: Internal Medicine

## 2024-08-11 DIAGNOSIS — I509 Heart failure, unspecified: Secondary | ICD-10-CM

## 2024-08-11 DIAGNOSIS — R609 Edema, unspecified: Secondary | ICD-10-CM

## 2024-08-11 NOTE — Patient Instructions (Signed)
 MEDICATION INSTRUCTIONS    Take the following medications the morning of surgery: inhalers, nebulizers, Protonix , zoloft     Hold NSAIDs (Motrin, Advil, Aleve, Ibuprofen, Naprosyn), vitamins, fish oil, and herbal supplements 7 days prior to the procedure.

## 2024-08-16 ENCOUNTER — Encounter: Payer: Self-pay | Admitting: Internal Medicine

## 2024-08-17 NOTE — Pharmacy (Signed)
 [name]  [facility] / Department of Pharmaceutical Services  Aminoglycoside Therapeutic Drug Monitoring: Gentamicin   08/17/2024      Hector [name]  Date of Birth:  1961/08/12    Ideal BW: 68.4 kg  Adjusted (Dosing) BW:  75.68 kg  Actual BW:    86.6 kg    Date RPh Current regimen (including mg/kg) Indication Target Levels (mcg/mL) SCr (mg/dL) CrCl* (mL/min) Measured level (mcg/mL) Plan (including when levels are due) Comments   9/4 MVS 380 mg Gent @5mg /kg (AdjBW=75.68 kg) Surg prophylaxis     Pre-op 1x dose                                                                              *Creatinine clearance is estimated by using the Cockcroft-Gault equation for adult patients and the Arlana graft for pediatric patients.    The Medical Executive Committee at [facility] has granted pharmacists via protocol order the ability to place or discontinue vancomycin or aminoglycoside level orders as they deem clinically appropriate.  The Pharmacy will continue to follow Hector [name]'s drug therapy with the primary team.  Please contact the Pharmacy with any questions.

## 2024-08-18 ENCOUNTER — Ambulatory Visit
Admission: RE | Admit: 2024-08-18 | Discharge: 2024-08-18 | Disposition: A | Discharge status: Home / Self Care | Payer: MEDICAID | Source: Ambulatory Visit | Attending: Student in an Organized Health Care Education/Training Program | Admitting: Student in an Organized Health Care Education/Training Program

## 2024-08-18 ENCOUNTER — Encounter (HOSPITAL_COMMUNITY)
Admission: RE | Disposition: A | Discharge status: Home / Self Care | Payer: Self-pay | Source: Ambulatory Visit | Attending: Student in an Organized Health Care Education/Training Program

## 2024-08-18 ENCOUNTER — Encounter (HOSPITAL_COMMUNITY): Payer: Self-pay | Admitting: Student in an Organized Health Care Education/Training Program

## 2024-08-18 ENCOUNTER — Other Ambulatory Visit: Payer: Self-pay

## 2024-08-18 ENCOUNTER — Encounter (HOSPITAL_COMMUNITY): Payer: Self-pay | Admitting: Pain Medicine

## 2024-08-18 ENCOUNTER — Ambulatory Visit (HOSPITAL_COMMUNITY): Payer: MEDICAID | Admitting: Pain Medicine

## 2024-08-18 DIAGNOSIS — E785 Hyperlipidemia, unspecified: Secondary | ICD-10-CM | POA: Insufficient documentation

## 2024-08-18 DIAGNOSIS — C679 Malignant neoplasm of bladder, unspecified: Secondary | ICD-10-CM | POA: Insufficient documentation

## 2024-08-18 DIAGNOSIS — I2510 Atherosclerotic heart disease of native coronary artery without angina pectoris: Secondary | ICD-10-CM | POA: Insufficient documentation

## 2024-08-18 DIAGNOSIS — I10 Essential (primary) hypertension: Secondary | ICD-10-CM | POA: Insufficient documentation

## 2024-08-18 DIAGNOSIS — N3020 Other chronic cystitis without hematuria: Secondary | ICD-10-CM | POA: Insufficient documentation

## 2024-08-18 DIAGNOSIS — Z9889 Other specified postprocedural states: Secondary | ICD-10-CM | POA: Insufficient documentation

## 2024-08-18 DIAGNOSIS — I272 Pulmonary hypertension, unspecified: Secondary | ICD-10-CM | POA: Insufficient documentation

## 2024-08-18 DIAGNOSIS — J449 Chronic obstructive pulmonary disease, unspecified: Secondary | ICD-10-CM | POA: Insufficient documentation

## 2024-08-18 DIAGNOSIS — Z7902 Long term (current) use of antithrombotics/antiplatelets: Secondary | ICD-10-CM | POA: Insufficient documentation

## 2024-08-18 SURGERY — RESECTION TUMOR BLADDER TRANSURETHRAL
Anesthesia: General | Wound class: Clean Contaminated Wounds-The respiratory, GI, Genital, or urinary

## 2024-08-18 MED ORDER — LIDOCAINE (PF) 20 MG/ML (2 %) INJECTION SOLUTION
INTRAMUSCULAR | Status: AC
Start: 2024-08-18 — End: 2024-08-18
  Filled 2024-08-18: qty 5

## 2024-08-18 MED ORDER — SODIUM CHLORIDE 0.9 % (FLUSH) INJECTION SYRINGE
2.0000 mL | INJECTION | INTRAMUSCULAR | Status: DC | PRN
Start: 2024-08-18 — End: 2024-08-18

## 2024-08-18 MED ORDER — PHENYLEPHRINE 1 MG/10 ML (100 MCG/ML) IN 0.9 % SOD.CHLORIDE IV SYRINGE
INJECTION | Freq: Once | INTRAVENOUS | Status: DC | PRN
Start: 2024-08-18 — End: 2024-08-18
  Administered 2024-08-18 (×2): 100 ug via INTRAVENOUS

## 2024-08-18 MED ORDER — FENTANYL (PF) 50 MCG/ML INJECTION SOLUTION
Freq: Once | INTRAMUSCULAR | Status: DC | PRN
Start: 2024-08-18 — End: 2024-08-18
  Administered 2024-08-18: 100 ug via INTRAVENOUS

## 2024-08-18 MED ORDER — LACTATED RINGERS INTRAVENOUS SOLUTION
INTRAVENOUS | Status: DC
Start: 2024-08-18 — End: 2024-08-18

## 2024-08-18 MED ORDER — DEXAMETHASONE SODIUM PHOSPHATE 4 MG/ML INJECTION SOLUTION
Freq: Once | INTRAMUSCULAR | Status: DC | PRN
Start: 2024-08-18 — End: 2024-08-18
  Administered 2024-08-18: 8 mg via INTRAVENOUS

## 2024-08-18 MED ORDER — PROPOFOL 10 MG/ML INTRAVENOUS EMULSION
INTRAVENOUS | Status: AC
Start: 2024-08-18 — End: 2024-08-18
  Filled 2024-08-18: qty 20

## 2024-08-18 MED ORDER — SUGAMMADEX 100 MG/ML INTRAVENOUS SOLUTION
INTRAVENOUS | Status: AC
Start: 2024-08-18 — End: 2024-08-18
  Filled 2024-08-18: qty 2

## 2024-08-18 MED ORDER — ONDANSETRON HCL (PF) 4 MG/2 ML INJECTION SOLUTION
4.0000 mg | Freq: Once | INTRAMUSCULAR | Status: DC | PRN
Start: 2024-08-18 — End: 2024-08-18

## 2024-08-18 MED ORDER — FENTANYL (PF) 50 MCG/ML INJECTION SOLUTION
25.0000 ug | INTRAMUSCULAR | Status: DC | PRN
Start: 2024-08-18 — End: 2024-08-18

## 2024-08-18 MED ORDER — MIDAZOLAM (PF) 1 MG/ML INJECTION SOLUTION
Freq: Once | INTRAMUSCULAR | Status: DC | PRN
Start: 2024-08-18 — End: 2024-08-18
  Administered 2024-08-18: 1 mg via INTRAVENOUS

## 2024-08-18 MED ORDER — SODIUM CHLORIDE 0.9% FLUSH BAG - 250 ML
INTRAVENOUS | Status: DC | PRN
Start: 2024-08-18 — End: 2024-08-18

## 2024-08-18 MED ORDER — MIDAZOLAM 1 MG/ML INJECTION SOLUTION
INTRAMUSCULAR | Status: AC
Start: 2024-08-18 — End: 2024-08-18
  Filled 2024-08-18: qty 2

## 2024-08-18 MED ORDER — ONDANSETRON HCL (PF) 4 MG/2 ML INJECTION SOLUTION
INTRAMUSCULAR | Status: AC
Start: 2024-08-18 — End: 2024-08-18
  Filled 2024-08-18: qty 2

## 2024-08-18 MED ORDER — DEXTROSE 5% IN WATER (D5W) FLUSH BAG - 250 ML
INTRAVENOUS | Status: DC | PRN
Start: 2024-08-18 — End: 2024-08-18

## 2024-08-18 MED ORDER — LIDOCAINE (PF) 100 MG/5 ML (2 %) INTRAVENOUS SYRINGE
INJECTION | Freq: Once | INTRAVENOUS | Status: DC | PRN
Start: 2024-08-18 — End: 2024-08-18
  Administered 2024-08-18: 100 mg via INTRAVENOUS

## 2024-08-18 MED ORDER — ALBUTEROL SULFATE HFA 90 MCG/ACTUATION AEROSOL INHALER - RN
Freq: Once | RESPIRATORY_TRACT | Status: DC | PRN
Start: 2024-08-18 — End: 2024-08-18
  Administered 2024-08-18: 8 via RESPIRATORY_TRACT

## 2024-08-18 MED ORDER — ROCURONIUM 10 MG/ML INTRAVENOUS SYRINGE WRAPPER
INJECTION | Freq: Once | INTRAVENOUS | Status: DC | PRN
Start: 2024-08-18 — End: 2024-08-18
  Administered 2024-08-18: 50 mg via INTRAVENOUS

## 2024-08-18 MED ORDER — SODIUM CHLORIDE 0.9 % (FLUSH) INJECTION SYRINGE
2.0000 mL | INJECTION | Freq: Three times a day (TID) | INTRAMUSCULAR | Status: DC
Start: 2024-08-18 — End: 2024-08-18

## 2024-08-18 MED ORDER — ACETAMINOPHEN 1,000 MG/100 ML (10 MG/ML) INTRAVENOUS SOLUTION
Freq: Once | INTRAVENOUS | Status: DC | PRN
Start: 2024-08-18 — End: 2024-08-18
  Administered 2024-08-18: 1000 mg via INTRAVENOUS

## 2024-08-18 MED ORDER — ONDANSETRON HCL (PF) 4 MG/2 ML INJECTION SOLUTION
Freq: Once | INTRAMUSCULAR | Status: DC | PRN
Start: 2024-08-18 — End: 2024-08-18
  Administered 2024-08-18: 4 mg via INTRAVENOUS

## 2024-08-18 MED ORDER — FENTANYL (PF) 50 MCG/ML INJECTION SOLUTION
12.5000 ug | INTRAMUSCULAR | Status: DC | PRN
Start: 2024-08-18 — End: 2024-08-18

## 2024-08-18 MED ORDER — SODIUM CHLORIDE 0.9 % INTRAVENOUS SOLUTION
380.0000 mg | Freq: Once | INTRAVENOUS | Status: AC
Start: 2024-08-18 — End: 2024-08-18
  Administered 2024-08-18: 380 mg via INTRAVENOUS
  Filled 2024-08-18: qty 9.5

## 2024-08-18 MED ORDER — FENTANYL (PF) 50 MCG/ML INJECTION SOLUTION
INTRAMUSCULAR | Status: AC
Start: 2024-08-18 — End: 2024-08-18
  Filled 2024-08-18: qty 2

## 2024-08-18 MED ORDER — DEXAMETHASONE SODIUM PHOSPHATE 4 MG/ML INJECTION SOLUTION
INTRAMUSCULAR | Status: AC
Start: 2024-08-18 — End: 2024-08-18
  Filled 2024-08-18: qty 6

## 2024-08-18 MED ORDER — SUGAMMADEX 100 MG/ML INTRAVENOUS SOLUTION
Freq: Once | INTRAVENOUS | Status: DC | PRN
Start: 2024-08-18 — End: 2024-08-18
  Administered 2024-08-18: 200 mg via INTRAVENOUS

## 2024-08-18 MED ORDER — LIDOCAINE (PF) 20 MG/ML (2 %) INJECTION SOLUTION
INTRAMUSCULAR | Status: AC
Start: 2024-08-18 — End: 2024-08-18
  Filled 2024-08-18: qty 15

## 2024-08-18 MED ORDER — PROPOFOL 10 MG/ML IV BOLUS
INJECTION | Freq: Once | INTRAVENOUS | Status: DC | PRN
Start: 2024-08-18 — End: 2024-08-18
  Administered 2024-08-18: 120 mg via INTRAVENOUS

## 2024-08-18 MED ORDER — PHENYLEPHRINE 1 MG/10 ML (100 MCG/ML) IN 0.9 % SOD.CHLORIDE IV SYRINGE
INJECTION | INTRAVENOUS | Status: AC
Start: 2024-08-18 — End: 2024-08-18
  Filled 2024-08-18: qty 10

## 2024-08-18 MED ORDER — NITROFURANTOIN MONOHYDRATE/MACROCRYSTALS 100 MG CAPSULE
100.0000 mg | ORAL_CAPSULE | Freq: Every evening | ORAL | 0 refills | Status: AC
Start: 2024-08-18 — End: 2024-08-23
  Filled 2024-08-18: qty 5, 5d supply, fill #0

## 2024-08-18 MED ORDER — SODIUM CHLORIDE 0.9 % INTRAVENOUS SOLUTION
INTRAVENOUS | Status: DC | PRN
Start: 2024-08-18 — End: 2024-08-18

## 2024-08-18 SURGICAL SUPPLY — 15 items
ARMBRD POSITION 20X8X2IN DVN FOAM (MED SURG SUPPLIES) ×1 IMPLANT
BAG DRAIN 4L URN ANTIREFLUX TWR STRL TVK METAL (UROLOGICAL SUPPLIES) ×1 IMPLANT
BAG DRAIN NONST LF  VNYL ACT 8MM DISP GE/OEC SYS (UROLOGICAL SUPPLIES) ×1 IMPLANT
BLANKET MISTRAL-AIR ADULT UPR BODY 79X29.9IN FRC AIR HI VOL BLWR INTUITIVE CONTROL PNL LRG LED (MED SURG SUPPLIES) ×1 IMPLANT
CUSTOM CYSTO PREP PACK ~~LOC~~ - RUBY MEMORIAL HOSPITAL (CUSTOM TRAYS & PACK) ×1 IMPLANT
DEVICE SECURE SNAPSECURE 360 SWVL CLIP BFUR FOLEY CATH ADH H FOAM STRL LF (UROLOGICAL SUPPLIES) ×1 IMPLANT
DEVICE SECURE STATLK BRTHBL ANCH PAD STAB FOLEY TRCT 3W CATH ADULT STRL LF  DISP (UROLOGICAL SUPPLIES) ×1 IMPLANT
ELECTRODE ESURG 12D LOOP MED LONG 24FR PLASMALOOP STRL DISP HFRQ RESCT LF (SURGICAL CUTTING SUPPLIES) ×1 IMPLANT
GARMENT COMPRESS MED CALF CENTAURA NYL VASOGRAD LTWT BRTHBL SEQ FIL BLU 18- IN (MED SURG SUPPLIES) ×1 IMPLANT
GOWN SURG XL L3 NONREINFORCE HKLP CLSR SET IN SLEEVE STRL LF  DISP BLU SIRUS SMS 47IN (DRAPE/PACKS/SHEETS/OR TOWEL) ×1 IMPLANT
KIT RM TURNOVER STPC CUSTOM ALLIED CYSTO (CUSTOM TRAYS & PACK) IMPLANT
MAT FLR 40X24IN ABS WTPRF BACKSHEET (MED SURG SUPPLIES) ×1 IMPLANT
SET 81IN REG CLAMP N-PYRG IRRG 10 GTT/ML STR CSCP DEHP BLADDER STRL LF (MED SURG SUPPLIES) ×1 IMPLANT
SOL IRRG 0.9% NACL 3L PRSV FR FLXB CONTAINR STRL LF (MEDICATIONS/SOLUTIONS) ×1 IMPLANT
TUBING SUCT CLR 20FT 9/32IN MEDIVAC NCDTV M/M CONN STRL LF (MED SURG SUPPLIES) ×1 IMPLANT

## 2024-08-18 NOTE — Nurses Notes (Signed)
 Discharge instructions given to patient and friend, JR at bedside. Opportunity given to ask questions or voice concerns. Instructed someone needs to stay with pt for the next 24 hours. Pt tolerated po intake. IV removed. Pt voided 75 cc pink tinged urine. Pt will be taken to discharge pharmacy to pick up prescription. Pt escorted to the main entrance via wheelchair by staff.

## 2024-08-18 NOTE — Discharge Summary (Signed)
 [place]  [facility]  Department of Surgery  Discharge Day Note    Hector [name]  Date of service: 08/18/2024  Date of Admission:  08/18/2024  Hospital Day:  LOS: 0 days     Examination at discharge: stable  Vital Signs: Temperature: 36.2 C (97.2 F) (08/18/24 1119)  Heart Rate: 70 (08/18/24 1130)  BP (Non-Invasive): (!) 103/47 (08/18/24 1130)  Respiratory Rate: 20 (08/18/24 1130)  SpO2: 92 % (08/18/24 1130)  General: No distress    Assessment:   63 y.o. male s/p TURBT    Disposition/Plan :   The patient will be discharged home with a 5 day course of prophylactic Macrobid .  They will follow up in approximately 1-2 weeks to review pathology and to coordinate further follow up.       Almarie Crumble, DO  [place]  [place]   Department of Urology, PGY-3    08/18/2024 11:37    I saw and examined the patient prior to discharge and discussed all findings and follow up recommendations.  I approved and was present at the time of discharge.    [place] Edwin Dorrien Grunder, MD  08/18/2024, 15:16

## 2024-08-18 NOTE — OR Surgeon (Signed)
 [place]  [facility]   DEPARTMENT OF UROLOGY   OPERATION SUMMARY     PATIENT NAME: Hector [name] [facility] NUMBER: [id number]   DATE OF SERVICE: 08/18/2024   DATE OF BIRTH: October 20, 1961     PREOPERATIVE DIAGNOSIS:   1. High risk High volume non muscle invasive bladder cancer  12/2023 - Gross hematuria  12/2023 - Admit [place] hospital  01/2024 - CT IVP (bladder mass)  02/2024 - TURBT [name]  04/20/2024 - Re-Staging TURBT [name]) pT1HG   Induction BCG 6/6 treatments  08/01/24 - surveillance cystoscopy with concern for recurrence      2. Past medical history significant for HTN, HLD, COPD, CAD, Pulmonary HTN,   3. Past surgical history significant for Carotid stents   4. Blood thinners: Plavix   5. ECOG: 2 - Up >50% of waking hours, ambulatory, capable of selfcare, unable to carry out any work activities    POSTOPERATIVE DIAGNOSIS: Same, two small approximately 1 cm papillary masses present on the right lateral and right anterior walls.     NAME OF PROCEDURE:    1. Rigid cystourethroscopy   2. Bipolar transurethral resection of bladder tumor (TURBT) Small (< 1cm)  3. Urethral dilation    FINDINGS:  There was a wide caliber area of narrowing present in the distal bulbar urethra which was unable to accommodate the 28 French rigid cystoscope.  There were 2 small approximately 1 cm papillary masses present on the right lateral and right anterior walls.  These were resected, muscle was present in the specimen.  Bilateral ureteral orifices were patent  Multiple prior resection sites present within the bladder without evidence of overlying recurrence.   All visible tumor was resected completely and there was detrusor muscle in specimen.  There was no bladder perforation visualized.       SURGEON(S): Hector Moats, MD  RESIDENT(S): Hector Crumble, DO     ESTIMATED BLOOD LOSS: Minimal   COMPLICATIONS: None  ANESTHESIA: General anesthesia    SPECIMENS: Bladder tumor x2 (right  lateral wall, right anterior wall)  TUBES:  None    INDICATIONS FOR PROCEDURE: Hector [name] is a 64 y.o. male with the above past medical history who presents today for TURBT. Last seen by [facility] 08/01/24 for surveillance cystoscopy 08/01/24. No acute issues.     DESCRIPTION OF PROCEDURE: The patient was consented preoperatively. All risks and benefits of the procedure were explained which include, damage to the ureter, urethra, bladder which could require future and/or emergent surgery to fix, ureteral or urethral stricture, bleeding, infection, bladder perforation requiring prolonged catheterization and/or surgery, failure to resect all of the tumor, and need for another surgery.  Complications from anesthesia which include heart attack, stroke, blood clot, pulmonary embolus, prolonged mechanical ventilation, and rare instances death. There are also risks of nerve damage from patient positioning.     Patient was then brought back to the operative suite and placed on the operating table. General anesthesia was induced. The patient was placed in dorsal lithotomy position with pressure points well padded. Genitalia were prepped and draped in usual sterile fashion. A surgical timeout was performed identifying the correct patient, date of birth, medical record number, surgical site, allergies, and surgical procedure.     Using a 22-French sheath and 30-degrees lens, we inserted the cystoscope into the urethra and into the bladder under direct visualization. Once inside the bladder, panendoscopic views of the bladder were obtained including anterior lateral walls,  floor, trigone and dome. There were 2 small approximately 1 cm papillary masses present in the right anterior right lateral wall. We then exchanged the cystoscope for a 27 fr Bipolar resectoscope.  His urethral meatus did not accommodate the scope.  We dilated the urethra with fleeta Needs sounds from 24 [place] to 28 [place] in 2 [place] increments.  Next, we  attempted to advance the 27 French bipolar resectoscope into the urethra and met resistance in the distal bulbar urethra.  We decided to remove the continuous-flow sheath and advanced the resectoscope with the 24 French sheath.  This passed with the proximal bulbar stricture with the ease.  Next, we advanced the Medium Loop and 12-degrees lens into the bladder. The bladder tumors were resected until muscle fibers were present. Spot coagulation was used for hemostasis.  At this point the resectoscope was removed there was no evidence of perforation, therefore we did not leave a Foley catheter.. The bladder was emptied and the procedure was terminated. The patient tolerated the procedure well with no complications. Dr. [place] Vonzella [name] was present and participated in the entire procedure.     DISPOSITION: The patient will be discharged home with a 5 day course of prophylactic Macrobid .  They will follow up in approximately 1-2 weeks to review pathology and to coordinate further follow up.    Hector Crumble, DO  [place]  [place]   Department of Urology, PGY-3    I was present for the entire viewing portion of the endoscopy from insertion to removal of scope.    Hector Edwin Lynnita Somma, MD

## 2024-08-18 NOTE — H&P (Signed)
 Urology History and Physical    Hector [name]  [id number]  03-Jun-1961    Chief Complaint:   1. High risk High volume non muscle invasive bladder cancer  12/2023 - Gross hematuria  12/2023 - Admit [facility]  01/2024 - CT IVP (bladder mass)  02/2024 - TURBT Hector [name]) cT1HG N0M0 ( 8cm overlaying both UOs)  04/20/2024 - Re-Staging TURBT Hector [name]) pT1HG   Induction BCG 6/6 treatments  08/01/24 - surveillance cystoscopy with concern for recurrence      2. Past medical history significant for HTN, HLD, COPD, CAD, Pulmonary HTN,   3. Past surgical history significant for Carotid stents   4. Blood thinners: Plavix   5. ECOG: 2 - Up >50% of waking hours, ambulatory, capable of selfcare, unable to carry out any work activities    HPI: Hector [name] is a 63 y.o. male with the above past medical history who presents today for TURBT. Last seen by [facility] 08/01/24 for surveillance cystoscopy 08/01/24. No acute issues.     PMHx:   Past Medical History:   Diagnosis Date    Alcohol abuse     Bladder cancer     Cancer (CMS [facility])     Carotid stenosis     right side stent    Chronic obstructive airway disease     Cirrhosis     Closed fracture of transverse process of thoracic vertebra 05/01/2021    Coma     after fall due to alcholism May 2022    Delirium, withdrawal, alcoholic (CMS HCC)     Depression     Encephalopathy 05/15/2021    Esophageal reflux     H/O urinary tract infection     8/18 per pt    Heart murmur     HTN (hypertension)     Hyperlipidemia     Ileus (CMS HCC) 05/18/2021    Left pulmonary contusion 05/01/2021    Leukopenia 05/18/2021    Multiple rib fractures 05/01/2021    Oxygen  dependent     Pleural effusion 05/11/2021    Serum ammonia increased (CMS HCC) 05/15/2021    Shortness of breath     Spleen injury 05/01/2021    Status post thoracentesis 05/19/2021    Thrombocytopenia (CMS HCC) 05/01/2021    Unknown cause of injury     fx nose           PSHx:   Past Surgical History:   Procedure Laterality Date     CAROTID STENT      COLONOSCOPY  08/2021    ESOPHAGOGASTRODUODENOSCOPY  08/2021    HX HEART CATHETERIZATION      HX OTHER Right 06/29/2023    TCAR    HX TONSILLECTOMY      OTHER SURGICAL HISTORY      HX of plastic surgery to right head           Family Hx: Hector [name]  Family Medical History:       Problem Relation (Age of Onset)    Asthma Father    Cerebral Aneurysm Sister    Dementia Mother    Heart Attack Paternal Grandmother (43)              Social Hx:   Social History     Socioeconomic History    Marital status: Single     Spouse name: Hector [name]    Number of children: 2    Years of education: 12    Highest education level: Not on file  Occupational History    Not on file   Tobacco Use    Smoking status: Every Day     Current packs/day: 1.00     Average packs/day: 1 pack/day for 51.7 years (51.7 ttl pk-yrs)     Types: Cigarettes     Start date: 37    Smokeless tobacco: Never    Tobacco comments:     1800 quit now   Vaping Use    Vaping status: Never Used   Substance and Sexual Activity    Alcohol use: Not Currently     Alcohol/week: 15.0 standard drinks of alcohol     Types: 15 Cans of beer per week     Comment:  may 17th, 2022     Drug use: Yes     Types: Marijuana     Comment:  a joint or 2 a day     Sexual activity: Yes   Other Topics Concern    Ability to Walk 1 Flight of Steps without SOB/CP No     Comment: SOB    Routine Exercise Not Asked    Ability to Walk 2 Flight of Steps without SOB/CP No     Comment: SOB & CP    Unable to Ambulate Not Asked    Total Care Not Asked    Ability To Do Own ADL's Yes    Uses Walker Not Asked    Other Activity Level Not Asked    Uses Cane Not Asked   Social History Narrative    Not on file     Social Determinants of Health     Financial Resource Strain: Low Risk  (05/02/2024)    Financial Resource Strain     SDOH Financial: No   Transportation Needs: Low Risk  (05/02/2024)    Transportation Needs     SDOH Transportation: No   Social Connections: Low Risk  (02/26/2024)    Social  Connections     SDOH Social Isolation: 5 or more times a week   Intimate Partner Violence: Low Risk  (05/02/2024)    Intimate Partner Violence     SDOH Domestic Violence: No   Housing Stability: Low Risk  (05/02/2024)    Housing Stability     SDOH Housing Situation: I have housing.     SDOH Housing Worry: No       Medications:   No current outpatient medications on file.       Allergies: Allergies[1]    ROS:  All 10 systems were reviewed.  There were pertinent positives in: Genital urinary system  There are no new changes to the prior reviewed review of systems.    Physical Exam:  BP (!) 129/56   Pulse 62   Temp 36 C (96.8 F)   Resp 18   Ht 1.72 m (5' 7.72)   Wt 82 kg (180 lb 12.4 oz)   SpO2 92%   BMI 27.72 kg/m       General - alert/oriented; NAD  Neck - supple, trachea midline  Lungs - respirations are unlabored, good inspiratory effort  Heart - RRR, extremity pulses intact  Abdomen - soft, NT/ND  Musculoskeletal - Atraumatic extremities   Neurological - CN II-XII Grossly intact  GU system - deferred. Plan to examine in OR    Urine Dip Results:  Urine Dip Results:   Leukocytes (Ref Range: Negative WBC's/uL):  (pt unable to give specimen on arrival)          Assessment:  63  y.o. male with   1. High risk High volume non muscle invasive bladder cancer  12/2023 - Gross hematuria  12/2023 - Admit [facility]  01/2024 - CT IVP (bladder mass)  02/2024 - TURBT Hector [name]) cT1HG N0M0 ( 8cm overlaying both UOs)  04/20/2024 - Re-Staging TURBT Hector [name]) pT1HG   Induction BCG 6/6 treatments  08/01/24 - surveillance cystoscopy with concern for recurrence      2. Past medical history significant for HTN, HLD, COPD, CAD, Pulmonary HTN,   3. Past surgical history significant for Carotid stents   4. Blood thinners: Plavix   5. ECOG: 2 - Up >50% of waking hours, ambulatory, capable of selfcare, unable to carry out any work activities    Plan:  To OR for TURBT  Consent obtained  They understand the risks of the  procedure which would include bleeding, infection, recurrence, damage to the surrounding structures, failure to correct pathology, loss of sensation, heart attack, stroke, and death.   All questions of patient and family answered    Hector Crumble, DO  [facility]  [place]   Department of Urology, PGY-3            08/18/2024 10:03        I saw and examined the patient.  I reviewed the resident's note.  I agree with the findings and plan of care as documented in the resident's note.  Any exceptions/additions are edited/noted.    Hector Edwin Darcella Shiffman, MD         [1]   Allergies  Allergen Reactions    Bee Venom Protein (Honey Bee)      Bee stings

## 2024-08-18 NOTE — Anesthesia Transfer of Care (Signed)
 ANESTHESIA TRANSFER OF CARE   Hector [name] is a 63 y.o. ,male, Weight: 82 kg (180 lb 12.4 oz)   had Procedure(s):  RESECTION TUMOR BLADDER TRANSURETHRAL  performed  08/18/24   Primary Service: Toribio Penning, *    Past Medical History:   Diagnosis Date    Alcohol abuse     Bladder cancer     Cancer (CMS [facility])     Carotid stenosis     right side stent    Chronic obstructive airway disease     Cirrhosis     Closed fracture of transverse process of thoracic vertebra 05/01/2021    Coma     after fall due to alcholism May 2022    Delirium, withdrawal, alcoholic (CMS HCC)     Depression     Encephalopathy 05/15/2021    Esophageal reflux     H/O urinary tract infection     8/18 per pt    Heart murmur     HTN (hypertension)     Hyperlipidemia     Ileus (CMS HCC) 05/18/2021    Left pulmonary contusion 05/01/2021    Leukopenia 05/18/2021    Multiple rib fractures 05/01/2021    Oxygen  dependent     Pleural effusion 05/11/2021    Serum ammonia increased (CMS HCC) 05/15/2021    Shortness of breath     Spleen injury 05/01/2021    Status post thoracentesis 05/19/2021    Thrombocytopenia (CMS HCC) 05/01/2021    Unknown cause of injury     fx nose      Allergy History as of 08/18/24       BEE VENOM PROTEIN (HONEY BEE)         Noted Status Severity Type Reaction    05/01/21 1124 Mason Hollering, LPN 94/81/77 Active       Comments: Bee stings                   I completed my transfer of care / handoff to the receiving personnel during which we discussed:  Access, Airway, All key/critical aspects of case discussed, Analgesia, Antibiotics, Expectation of post procedure, Fluids/Product, Gave opportunity for questions and acknowledgement of understanding, Labs and PMHx      Post Location: PACU                        Additional Info:Report to rn, care relinquished                                      Last OR Temp: Temperature: 36.2 C (97.2 F)  ABG:  PH (ARTERIAL)   Date Value Ref Range Status   05/19/2021 7.43 7.35 - 7.45 Final      PH (T)   Date Value Ref Range Status   05/19/2021 7.42 7.35 - 7.45 Final     PCO2 (ARTERIAL)   Date Value Ref Range Status   05/19/2021 29 (L) 35 - 45 mm/Hg Final     PO2 (ARTERIAL)   Date Value Ref Range Status   05/19/2021 70 (L) 72 - 100 mm/Hg Final     SODIUM   Date Value Ref Range Status   05/19/2021 142 137 - 145 mmol/L Final     POTASSIUM   Date Value Ref Range Status   08/10/2024 4.0 3.5 - 5.1 mmol/L Final   02/29/2024 4.3 3.5 - 5.1 mmol/L Final  KETONES   Date Value Ref Range Status   08/01/2024 Negative Negative mg/dL Final   94/80/7974 Negative Negative mg/dL Final     WHOLE BLOOD POTASSIUM   Date Value Ref Range Status   05/19/2021 4.3 3.5 - 4.6 mmol/L Final     CHLORIDE   Date Value Ref Range Status   05/19/2021 115 (H) 101 - 111 mmol/L Final     CALCIUM   Date Value Ref Range Status   08/10/2024 8.1 (L) 8.4 - 10.2 mg/dL Final   96/82/7974 8.1 (L) 8.6 - 10.3 mg/dL Final     Comment:     Gadolinium-containing contrast can interfere with calcium measurement.       Calculated P Axis   Date Value Ref Range Status   01/05/2024 65 degrees Final     Calculated R Axis   Date Value Ref Range Status   01/05/2024 55 degrees Final     Calculated T Axis   Date Value Ref Range Status   01/05/2024 49 degrees Final     IONIZED CALCIUM   Date Value Ref Range Status   05/19/2021 1.22 1.10 - 1.35 mmol/L Final     LACTATE   Date Value Ref Range Status   05/19/2021 0.8 0.0 - 1.3 mmol/L Final     HEMOGLOBIN   Date Value Ref Range Status   05/19/2021 10.3 (L) 12.0 - 18.0 g/dL Final     OXYHEMOGLOBIN   Date Value Ref Range Status   05/19/2021 92.8 85.0 - 98.0 % Final     CARBOXYHEMOGLOBIN   Date Value Ref Range Status   05/19/2021 2.1 0.0 - 2.5 % Final     MET-HEMOGLOBIN   Date Value Ref Range Status   05/19/2021 1.3 0.0 - 2.0 % Final     BASE EXCESS (ARTERIAL)   Date Value Ref Range Status   05/17/2021 0.1 0.0 - 1.0 mmol/L Final     BASE DEFICIT   Date Value Ref Range Status   05/19/2021 4.2 (H) 0.0 - 3.0 mmol/L Final      BICARBONATE (ARTERIAL)   Date Value Ref Range Status   05/19/2021 21.6 18.0 - 26.0 mmol/L Final     TEMPERATURE, COMP   Date Value Ref Range Status   05/19/2021 37.0   C Final     Airway:* No LDAs found *  Blood pressure (!) 123/59, pulse 78, temperature 36.2 C (97.2 F), resp. rate 20, height 1.72 m (5' 7.72), weight 82 kg (180 lb 12.4 oz), SpO2 97%.

## 2024-08-18 NOTE — Discharge Instructions (Addendum)
SURGICAL DISCHARGE INSTRUCTIONS     Dr. Morley, Chad Edwin, MD  performed your RESECTION TUMOR BLADDER TRANSURETHRAL today at the [facility]    [facility]:  Monday through Friday from 6 a.m. - 7 p.m.: [phone]  Between 7 p.m. - 6 a.m., weekends and holidays:  Call [facility] at [phone] or [phone].    PLEASE SEE WRITTEN HANDOUTS AS DISCUSSED BY YOUR NURSE:      SIGNS AND SYMPTOMS OF A WOUND / INCISION INFECTION   Be sure to watch for the following:   Increase in redness or red streaks near or around the wound or incision.   Increase in pain that is intense or severe and cannot be relieved by the pain medication that your doctor has given you.   Increase in swelling that cannot be relieved by elevation of a body part, or by applying ice, if permitted.   Increase in drainage, or if yellow / green in color and smells bad. This could be on a dressing or a cast.   Increase in fever for longer than 24 hours, or an increase that is higher than 101 degrees Fahrenheit (normal body temperature is 98 degrees Fahrenheit). The incision may feel warm to the touch.    **CALL YOUR DOCTOR IF ONE OR MORE OF THESE SIGNS / SYMPTOMS SHOULD OCCUR.    ANESTHESIA INFORMATION   ANESTHESIA -- ADULT PATIENTS:  You have received intravenous sedation / general anesthesia, and you may feel drowsy and light-headed for several hours. You may even experience some forgetfulness of the procedure. DO NOT DRIVE A MOTOR VEHICLE or perform any activity requiring complete alertness or coordination until you feel fully awake in about 24-48 hours. Do not drink alcoholic beverages for at least 24 hours. Do not stay alone, you must have a responsible adult available to be with you. You may also experience a dry mouth or nausea for 24 hours. This is a normal side effect and will disappear as the effects of the medication wear off.    REMEMBER   If you experience any difficulty breathing, chest pain, bleeding  that you feel is excessive, persistent nausea or vomiting or for any other concerns:  Call your physician Dr. Morley at [phone] or [phone]. You may also ask to have the doctor on call paged. They are available to you 24 hours a day.    SPECIAL INSTRUCTIONS / COMMENTS   See attachments.    FOLLOW-UP APPOINTMENTS   Please call patient services at [phone] or [phone] to schedule a date / time of return. They are open Monday - Friday from 7:30 am - 5:00 pm.

## 2024-08-18 NOTE — Anesthesia Preprocedure Evaluation (Signed)
 ANESTHESIA PRE-OP EVALUATION  Planned Procedure: RESECTION TUMOR BLADDER TRANSURETHRAL  Review of Systems     anesthesia history negative     patient summary reviewed  nursing notes reviewed        Pulmonary   COPD, shortness of breath and home oxygen  (On 4 liters PRN and at night),   Cardiovascular    Hypertension, murmur, CAD and hyperlipidemia ,No peripheral edema,        GI/Hepatic/Renal    GERD, well controlled and liver disease        Endo/Other    Thrombocytopenia and blood dyscrasia,      Neuro/Psych/MS    depression, Substance use, alcohol, marijuana     Cancer  CA,                       Physical Assessment      Airway       Mallampati: II    TM distance: >3 FB    Neck ROM: full  Mouth Opening: good.  No Facial hair  No Beard  No endotracheal tube present  No Tracheostomy present    Dental           (+) poor dentition, missing             Pulmonary    Breath sounds clear to auscultation  (-) no rhonchi, no decreased breath sounds, no wheezes, no rales and no stridor     Cardiovascular        (-) no friction rub, carotid bruit is not present and no peripheral edema     Other findings              Plan  ASA 4     Planned anesthesia type: general     general anesthesia with endotracheal tube intubation      PONV Plan:  I plan to administer pharmcologic prophalaxis antiemetics  POV PLAN:   plan for postoperative opioid use            Intravenous induction     Anesthesia issues/risks discussed are: PONV, Sore Throat, Difficult Airway, Aspiration, Stroke, Cardiac Events/MI, Intraoperative Awareness/ Recall and Post-op Agitation/Tantrum.  Anesthetic plan and risks discussed with patient  signed consent obtained      Use of blood products discussed with patient who consented to blood products.      Patient's NPO status is appropriate for Anesthesia.           Plan discussed with CRNA and attending.                 Studies Labs   Results for orders placed during the hospital encounter of 01/05/24    ECG 12 LEAD  01/05/2024  2:55 PM    Narrative  Normal sinus rhythm  with 1st degree AV block  with occasional premature atrial complexes  Poor R wave progression  Abnormal ECG    Confirmed by Cindy Loving ([id number]) on 01/06/2024 10:05:10 PM      Results for orders placed during the hospital encounter of 05/26/23    TRANSTHORACIC ECHOCARDIOGRAM - ADULT 05/26/2023  2:37 PM    Narrative  **See full report in linked PDF document[facility]  [facility]  [address] [place], [place] [address]    Transthoracic Echocardiographic Report    ______________________________________________________________________________  Name: CALVERT, CHARLAND                      MRN: [id number]  Weight: 249 lb  Study Date: 05/26/2023 02:07 PM             DOB: May 11, 1961             Height: 69 in  Gender: Male                                Age: 63 yrs                 BSA: 2.3 m2  Accession #: [id number]                  BP: 150/65 mmHg  Patient Location: [facility]  Ordering Provider: GABRIEL ADE  Tech: Ozell Jubilee, RDCS    ______________________________________________________________________________  Procedure:  Transthoracic complete echo, 2D, spectral and tissue Doppler, color flow Doppler, M-mode.    Quality:  The study images were of technically adequate quality.    Indications: Pre-operative cardiovascular examination    History: HTN, Alcohol Abuse, COPD, Smoker    Conclusions:  Normal left ventricular size.  Mild left ventricular hypertrophy.  Left ventricular systolic function is normal.  The left ventricular ejection fraction by visual assessment is estimated to be 55-60%.  Left ventricular diastolic parameters are normal.  Trace tricuspid regurgitation present.    Echocardiogram done by Annabella Bonus. Exam reviewed by Ozell Jubilee RDCS.    Findings  Left Ventricle:   Normal left ventricular size. Mild left ventricular hypertrophy. The left ventricular ejection fraction by visual assessment is estimated to be 55-60%. Left  ventricular systolic  function is normal. Left ventricular diastolic parameters are normal.  Right Ventricle:   Mildly dilated right ventricle. Normal right ventricular systolic function. RV systolic pressure is consistent with mild pulmonary hypertension.  Left Atrium:   The left atrium is normal in size.  Right Atrium:   The right atrium is of normal size.  Mitral Valve:   No evidence of mitral stenosis. No significant mitral regurgitation present.  Tricuspid Valve:   Trace tricuspid regurgitation present.  Aortic Valve:   Trileaflet aortic valve. No Aortic valve stenosis. No significant aortic regurgitation present.  Pulmonic Valve:   No significant pulmonic valve regurgitation present.  Atrial Septum:   The interatrial septum is normal in appearance.  IVC/Hepatic Veins:   Dilated IVC with >50% inspiratory collapse (estimated RA pressure: 8 mmHg).  Aorta:   The aortic root is of normal size. The ascending aorta is normal in size.  Pericardium/Pleural space:   No significant pericardial effusion demonstrated.    Electronically signed by: Lillia Burner, MD on 05/26/2023 05:33 PM          Results for orders placed during the hospital encounter of 01/28/24    CAROTID ARTERY DUPLEX 01/28/2024 11:06 AM    Interpretation Summary  Procedure: A duplex ultrasound examination of the extracranial carotid arteries was performed using 2D imaging with color and spectral Doppler for evaluation of atherosclerotic disease.  Bilateral: 1. Right ICA: Patent stent present with normal flow velocities - No diameter reduction..2. Right ECA: Greater than 50% diameter reduction3. Right vertebral: not visualized due to patient in pain4. Left ICA: Less than 50% stenosis-Mild5. Left ECA: Greater than 50% diameter reduction6. Left vertebral: not visualized due to patient in pain  Conclusions: There is no evidence of a hemodynamically significant stenosis in the bilateral internal carotid arteries. Widely patent right ICA stent.     CBC  Lab  Results  Component Value Date    WBC 2.1 (L) 08/10/2024    HGB 8.9 (L) 08/10/2024    HCT 26.7 (L) 08/10/2024    PLTCNT 76 (L) 08/10/2024      CMP  Lab Results   Component Value Date    SODIUM 138 08/10/2024    POTASSIUM 4.0 08/10/2024    CHLORIDE 110 (H) 08/10/2024    CO2 21 (L) 08/10/2024    BUN 14 08/10/2024    CREATININE 1.12 08/10/2024    GLUCOSENF 81 08/10/2024    ANIONGAP 7 08/10/2024    BUNCRRATIO 13 08/10/2024    GFR >60 08/10/2024    CALCIUM 8.1 (L) 08/10/2024    MAGNESIUM  1.8 01/15/2024     LFTs  Lab Results   Component Value Date    AST 47 08/10/2024    ALT 19 08/10/2024    ALKPHOS 148 (H) 08/10/2024    TOTBILIRUBIN 0.6 08/10/2024    BILIRUBINCON 0.0 05/13/2021    TOTALPROTEIN 6.2 (L) 08/10/2024    ALBUMIN  2.9 (L) 08/10/2024     Coags  Lab Results   Component Value Date    PROTHROMTME 12.4 02/27/2024    INR 1.11 02/27/2024    APTT 28.2 02/26/2024     Blood Gas  BLOOD GAS  Lab Results   Component Value Date    PH 6.0 06/29/2024    PCO2 29 (L) 05/19/2021    PO2 70 (L) 05/19/2021    BICARBONATE 21.6 05/19/2021

## 2024-08-18 NOTE — Anesthesia Postprocedure Evaluation (Signed)
 Anesthesia Post Op Evaluation    Patient: Hector [name]  Procedure(s):  RESECTION TUMOR BLADDER TRANSURETHRAL    Last Vitals:Temperature: 36.2 C (97.2 F) (08/18/24 1119)  Heart Rate: 70 (08/18/24 1130)  BP (Non-Invasive): (!) 103/47 (08/18/24 1130)  Respiratory Rate: 20 (08/18/24 1130)  SpO2: 92 % (08/18/24 1130)    No notable events documented.    Patient is sufficiently recovered from the effects of anesthesia to participate in the evaluation and has returned to their pre-procedure level.  Patient location during evaluation: PACU       Patient participation: complete - patient participated  Level of consciousness: awake and alert and responsive to verbal stimuli    Pain management: adequate  Airway patency: patent    Anesthetic complications: no  Cardiovascular status: acceptable  Respiratory status: acceptable  Hydration status: acceptable  Patient post-procedure temperature: Pt Normothermic   PONV Status: Absent

## 2024-08-22 ENCOUNTER — Other Ambulatory Visit: Payer: Self-pay | Admitting: Family Medicine

## 2024-08-22 DIAGNOSIS — G629 Polyneuropathy, unspecified: Secondary | ICD-10-CM

## 2024-08-23 ENCOUNTER — Ambulatory Visit (AMBULATORY_SURGERY_CENTER): Admitting: Internal Medicine

## 2024-08-23 ENCOUNTER — Encounter: Payer: Self-pay | Admitting: Internal Medicine

## 2024-08-23 ENCOUNTER — Ambulatory Visit (INDEPENDENT_AMBULATORY_CARE_PROVIDER_SITE_OTHER): Payer: Self-pay | Admitting: UROLOGY

## 2024-08-23 ENCOUNTER — Ambulatory Visit (INDEPENDENT_AMBULATORY_CARE_PROVIDER_SITE_OTHER): Payer: Self-pay | Admitting: Student in an Organized Health Care Education/Training Program

## 2024-08-23 ENCOUNTER — Encounter (INDEPENDENT_AMBULATORY_CARE_PROVIDER_SITE_OTHER): Payer: Self-pay

## 2024-08-23 VITALS — BP 117/64 | HR 71 | Temp 98.2°F | Resp 14 | Ht 65.0 in | Wt 142.0 lb

## 2024-08-23 DIAGNOSIS — N3289 Other specified disorders of bladder: Secondary | ICD-10-CM

## 2024-08-23 DIAGNOSIS — K5730 Diverticulosis of large intestine without perforation or abscess without bleeding: Secondary | ICD-10-CM | POA: Diagnosis not present

## 2024-08-23 DIAGNOSIS — D123 Benign neoplasm of transverse colon: Secondary | ICD-10-CM | POA: Diagnosis not present

## 2024-08-23 DIAGNOSIS — Z860100 Personal history of colon polyps, unspecified: Secondary | ICD-10-CM

## 2024-08-23 DIAGNOSIS — K6289 Other specified diseases of anus and rectum: Secondary | ICD-10-CM

## 2024-08-23 DIAGNOSIS — D124 Benign neoplasm of descending colon: Secondary | ICD-10-CM | POA: Diagnosis not present

## 2024-08-23 DIAGNOSIS — Z1211 Encounter for screening for malignant neoplasm of colon: Secondary | ICD-10-CM

## 2024-08-23 DIAGNOSIS — Z860101 Personal history of adenomatous and serrated colon polyps: Secondary | ICD-10-CM | POA: Diagnosis not present

## 2024-08-23 DIAGNOSIS — K648 Other hemorrhoids: Secondary | ICD-10-CM

## 2024-08-23 LAB — SURGICAL PATHOLOGY SPECIMEN

## 2024-08-23 MED ORDER — SODIUM CHLORIDE 0.9 % IV SOLN: 500.0000 mL | Freq: Once | INTRAVENOUS | Status: DC

## 2024-08-23 NOTE — Op Note (Signed)
 [facility] Patient Name: Curtis Luna Procedure Date: 08/23/2024 7:37 AM MRN: [id number] Endoscopist: Norleen SAILOR. Abran , MD, [id number] Age: 63 Referring MD:  Date of Birth: 1961/09/21 Gender: Male Account #: [id number] Procedure:                Colonoscopy with cold snare polypectomy x 1 Indications:              High risk colon cancer surveillance: Personal                            history of adenoma (10 mm or greater in size).                            Previous examinations 2012, 2022. History of                            sigmoid colectomy for diverticular disease Medicines:                Monitored Anesthesia Care Procedure:                Pre-Anesthesia Assessment:                           - Prior to the procedure, a History and Physical                            was performed, and patient medications and                            allergies were reviewed. The patient's tolerance of                            previous anesthesia was also reviewed. The risks                            and benefits of the procedure and the sedation                            options and risks were discussed with the patient.                            All questions were answered, and informed consent                            was obtained. Prior Anticoagulants: The patient has                            taken no anticoagulant or antiplatelet agents. ASA                            Grade Assessment: II - A patient with mild systemic                            disease. After reviewing the risks and benefits,  the patient was deemed in satisfactory condition to                            undergo the procedure.                           After obtaining informed consent, the colonoscope                            was passed under direct vision. Throughout the                            procedure, the patient's blood pressure, pulse, and                             oxygen saturations were monitored continuously. The                            Olympus Scope SN: [id number] was introduced through                            the anus and advanced to the the cecum, identified                            by appendiceal orifice and ileocecal valve. The                            ileocecal valve, appendiceal orifice, and rectum                            were photographed. The quality of the bowel                            preparation was excellent. The colonoscopy was                            performed without difficulty. The patient tolerated                            the procedure well. The bowel preparation used was                            SUPREP via split dose instruction. Scope In: 8:35:01 AM Scope Out: 8:45:10 AM Scope Withdrawal Time: 0 hours 8 minutes 27 seconds  Total Procedure Duration: 0 hours 10 minutes 9 seconds  Findings:                 A 4 mm polyp was found in the transverse colon. The                            polyp was removed with a cold snare. Resection and                            retrieval were complete.  Many diverticula were found in the entire colon.                           The exam was otherwise without abnormality on                            direct and retroflexion views. Hypertrophic anal                            papilla. Complications:            No immediate complications. Estimated blood loss:                            None. Estimated Blood Loss:     Estimated blood loss: none. Impression:               - One 4 mm polyp in the transverse colon, removed                            with a cold snare. Resected and retrieved.                           - Diverticulosis in the entire examined colon.                            Hypertrophic anal papilla                           - The examination was otherwise normal on direct                            and retroflexion views. Recommendation:            - Repeat colonoscopy in 5 years for surveillance                            (personal history of advanced adenoma).                           - Patient has a contact number available for                            emergencies. The signs and symptoms of potential                            delayed complications were discussed with the                            patient. Return to normal activities tomorrow.                            Written discharge instructions were provided to the                            patient.                           -  Resume previous diet.                           - Continue present medications.                           - Await pathology results. Norleen SAILOR. Abran, MD 08/23/2024 8:55:14 AM This report has been signed electronically.

## 2024-08-23 NOTE — Progress Notes (Signed)
 Called to room to assist during endoscopic procedure.  Patient ID and intended procedure confirmed with present staff. Received instructions for my participation in the procedure from the performing physician.

## 2024-08-23 NOTE — Progress Notes (Signed)
 Expand All Collapse All HISTORY OF PRESENT ILLNESS:   Curtis Luna is a 63 y.o. male, native of [place], with a history of GERD as well as complicated diverticular disease requiring sigmoid colectomy.  He presents today with concerns over possible painful hemorrhoid and the need for surveillance colonoscopy.   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  Follow-up in 3 years recommended.  No significant hemorrhoids noted at that time.   Patient tells me that in May 2024 he saw his primary care provider regarding a pea-sized rectal protuberance which was not painful.  Though no  rectal exam was performed, he was diagnosed with thrombosed hemorrhoid.  Treated with witch hazel and Preparation H.  Improved.  He developed the same problem the end of April this year.  A bit larger.  Now better.  He wonders if this is something that needs to be addressed.  His bowel habits are regular.  No other GI complaints. EGD June 2020 was normal     REVIEW OF SYSTEMS:   All non-GI ROS negative except for Allergies, anxiety, back pain, visual change.         Past Medical History:  Diagnosis Date   Abdominal pain, right lower quadrant     Acute bronchospasm     Allergy     Anxiety      on and off   Backache, unspecified     Chicken pox     Diverticulosis of colon (without mention of hemorrhage)     Esophageal reflux     External hemorrhoids without mention of complication      thrombosed   Hiatal hernia     Hyperlipidemia      no medicines    Inguinal hernia without mention of obstruction or gangrene, unilateral or unspecified, (not specified as recurrent)     Migraine     Neuropathy     Obstructive sleep apnea (adult) (pediatric)      uses oral appliance   Other symptoms involving digestive system(787.99)     Pain in joint, forearm     Pain in joint, shoulder region     Seasonal allergies     Sleep  apnea      no cpap but uses dental device    Thoracic or lumbosacral neuritis or radiculitis, unspecified                 Past Surgical History:  Procedure Laterality Date   COLON RESECTION   10/06    sigmoid colon   COLONOSCOPY       INGUINAL HERNIA REPAIR   12/2012    Left   UPPER GASTROINTESTINAL ENDOSCOPY       WISDOM TOOTH EXTRACTION              Social History MAYJOR AGER  reports that he quit smoking about 36 years ago. His smoking use included cigarettes and cigars. He has never used smokeless tobacco. He reports current alcohol use of about 14.0 standard drinks of alcohol per week. He reports that he does not use drugs.   family history includes Allergies in his daughter; Aneurysm in his sister; Diabetes in his maternal aunt; Healthy in his brother; Heart attack in his paternal grandmother; Heart disease in his paternal grandmother; Lung cancer (age of onset: 45) in his mother; Migraines in his daughter; Neuropathy in an other family member; Other in his father.  Allergies       Allergies  Allergen Reactions   Escitalopram Hives      Pt felt anxious  Pt felt his skin was on fire   Influenza Vaccines Other (See Comments)      Brain fog, dizziness, tinnitus   Protonix  [Pantoprazole  Sodium] Other (See Comments)      Tingling of extremities            PHYSICAL EXAMINATION: Vital signs: BP 122/76   Pulse 67   Ht 5' 5 (1.651 m)   Wt 142 lb (64.4 kg)   BMI 23.63 kg/m   Constitutional: generally well-appearing, no acute distress Psychiatric: alert and oriented x3, cooperative Eyes: extraocular movements intact, anicteric, conjunctiva pink Mouth: oral pharynx moist, no lesions Neck: supple no lymphadenopathy Cardiovascular: heart regular rate and rhythm, no murmur Lungs: clear to auscultation bilaterally Abdomen: soft, nontender, nondistended, no obvious ascites, no peritoneal signs, normal bowel sounds, no organomegaly Rectal: Deferred until  colonoscopy Extremities: no clubbing, cyanosis, or lower extremity edema bilaterally Skin: no lesions on visible extremities Neuro: No focal deficits.  Cranial nerves intact   ASSESSMENT:   1.  Question thrombosed hemorrhoid intermittently.  Currently asymptomatic 2.  GERD.  Normal upper endoscopy 2020.  On Pepcid 3.  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  Due for surveillance 4.  History of complicated diverticular disease status post sigmoid colectomy   PLAN:   1.  Reflux precautions 2.  Surveillance colonoscopy.The nature of the procedure, as well as the risks, benefits, and alternatives were carefully and thoroughly reviewed with the patient. Ample time for discussion and questions allowed. The patient understood, was satisfied, and agreed to proceed. 3.  Further recommendations regarding possible hemorrhoids to be determined after the above.

## 2024-08-23 NOTE — Progress Notes (Signed)
 A/o x 3, VSS, gd SR's, pleased with anesthesia, report to RN

## 2024-08-23 NOTE — Patient Instructions (Addendum)
-  Handout on polyps and diverticulosis provided. -await pathology results. -repeat colonoscopy in 5 years for surveillance recommended.  -Continue present medications.  YOU HAD AN ENDOSCOPIC PROCEDURE TODAY AT THE [facility]:   Refer to the procedure report that was given to you for any specific questions about what was found during the examination.  If the procedure report does not answer your questions, please call your gastroenterologist to clarify.  If you requested that your care partner not be given the details of your procedure findings, then the procedure report has been included in a sealed envelope for you to review at your convenience later.  YOU SHOULD EXPECT: Some feelings of bloating in the abdomen. Passage of more gas than usual.  Walking can help get rid of the air that was put into your GI tract during the procedure and reduce the bloating. If you had a lower endoscopy (such as a colonoscopy or flexible sigmoidoscopy) you may notice spotting of blood in your stool or on the toilet paper. If you underwent a bowel prep for your procedure, you may not have a normal bowel movement for a few days.  Please Note:  You might notice some irritation and congestion in your nose or some drainage.  This is from the oxygen used during your procedure.  There is no need for concern and it should clear up in a day or so.  SYMPTOMS TO REPORT IMMEDIATELY:  Following lower endoscopy (colonoscopy or flexible sigmoidoscopy):  Excessive amounts of blood in the stool  Significant tenderness or worsening of abdominal pains  Swelling of the abdomen that is new, acute  Fever of 100F or higher  For urgent or emergent issues, a gastroenterologist can be reached at any hour by calling [phone]. Do not use MyChart messaging for urgent concerns.    DIET:  We do recommend a small meal at first, but then you may proceed to your regular diet.  Drink plenty of fluids but you should avoid  alcoholic beverages for 24 hours.  ACTIVITY:  You should plan to take it easy for the rest of today and you should NOT DRIVE or use heavy machinery until tomorrow (because of the sedation medicines used during the test).    FOLLOW UP: Our staff will call the number listed on your records the next business day following your procedure.  We will call around 7:15- 8:00 am to check on you and address any questions or concerns that you may have regarding the information given to you following your procedure. If we do not reach you, we will leave a message.     If any biopsies were taken you will be contacted by phone or by letter within the next 1-3 weeks.  Please call us  at [phone] if you have not heard about the biopsies in 3 weeks.    SIGNATURES/CONFIDENTIALITY: You and/or your care partner have signed paperwork which will be entered into your electronic medical record.  These signatures attest to the fact that that the information above on your After Visit Summary has been reviewed and is understood.  Full responsibility of the confidentiality of this discharge information lies with you and/or your care-partner.

## 2024-08-23 NOTE — Nursing Note (Signed)
 RN received direct call from the call center staff reporting pt is returning RN's call. RN connected to pt.  He is wanting to know if he is to resume his BCG treatments. RN advised that when Dr Craige calls with pathology report to discuss treatment plan. Pt is agreeable to this.   Pt has no other questions or concerns.       Shreyas Piatkowski, CLINICAL CARE COORDINATOR

## 2024-08-23 NOTE — Nursing Note (Signed)
 Clinical Question  Received: Today  Erwin Arrant, IZELLA SQUIBB [facility] from [facility] [place] sent at 08/23/2024  2:18 PM EDT    Summary: Clinical Question    Copied From CRM [id number].  Colman Eastburn (Self) called with a clinical question.  Pt would like information on his POC. Please call when able                Call History    Contact Date/Time Type Contact Phone/Fax By   08/23/2024 02:06 PM EDT Phone (Incoming) Omauri Misch (Self) [phone] Erwin Arrant, COTA            RN returned call to pt at this time.  RN could hear the phone was answered but no response from patient and phone disconnected. RN sent pt a my chart message as he is active since 08/19/24 to determine if there is anything that could be done through my chart since phone issues. Awaiting response.     Arthor Gorter, CLINICAL CARE COORDINATOR

## 2024-08-24 ENCOUNTER — Telehealth: Payer: Self-pay

## 2024-08-24 NOTE — Telephone Encounter (Signed)
 Left message

## 2024-08-25 ENCOUNTER — Ambulatory Visit: Payer: Self-pay | Admitting: Internal Medicine

## 2024-08-25 LAB — SURGICAL PATHOLOGY

## 2024-08-26 ENCOUNTER — Encounter (HOSPITAL_BASED_OUTPATIENT_CLINIC_OR_DEPARTMENT_OTHER): Payer: MEDICAID | Admitting: Student in an Organized Health Care Education/Training Program

## 2024-08-26 DIAGNOSIS — C679 Malignant neoplasm of bladder, unspecified: Secondary | ICD-10-CM

## 2024-08-26 NOTE — Progress Notes (Signed)
 [facility]  [address]  [place] [phone]  Operated by [facility]  Telephone Visit    Name:  Mahki Spikes MRN: [id number]   Date:  08/26/2024 DOB: [birth year] (63 y.o.)          The patient/family initiated a request for telephone service.  Verbal consent for this service was obtained from the patient/family.  Reason for audio only:Patient preference    Last office visit in this department: Visit date not found      Reason for call: pathology review    Call notes:  Pathology from [facility] showed no malignancy. Will plan for next surveillance cystoscopy in 3 months.      ICD-10-CM    1. Malignant neoplasm of urinary bladder, unspecified site (CMS HCC)  C67.9           LOS Determination: Time-based- Total encounter time including direct communication, chart review and documentation was 10 minutes    Toribio Penning, MD

## 2024-09-07 ENCOUNTER — Ambulatory Visit (INDEPENDENT_AMBULATORY_CARE_PROVIDER_SITE_OTHER): Payer: Self-pay | Admitting: Internal Medicine

## 2024-09-09 ENCOUNTER — Encounter: Payer: Self-pay | Admitting: Internal Medicine

## 2024-09-24 DIAGNOSIS — R7303 Prediabetes: Secondary | ICD-10-CM | POA: Insufficient documentation

## 2024-09-24 NOTE — Progress Notes (Signed)
 [facility] [employer] [address], [address] [place], [place] [address] [phone]  Date:  09/29/2024   Name:  Curtis Luna   DOB:  1961-11-30   MRN:  [id number]  PCP:  Watt Harlene BROCKS, MD    Chief Complaint: Follow-up (No concerns /)   History of Present Illness:  Curtis Luna is a 63 y.o. very pleasant male patient who presents with the following:  Patient seen today for periodic follow-up.  I saw him most recently in March for his physical-  Patient seen today for physical exam-  history of coronary artery disease, ocular migraine, GERD, hyperlipidemia, mild prediabetes He follows up regularly with cardiology-most recent visit with lipid pharmacist was in July.  It looks like he paused his statin in hopes that it would improve his neuropathy but he did not work.  He was hoping to drop his LDL with lifestyle changes Colonoscopy done in September  - Flu shot- declines today  - Recommend Shingrix - Recommend pneumonia vaccine - Labs completed in March, lipid updated in June Gabapentin , lorazepam  as needed  Lab Results  Component Value Date   PSA 0.98 02/22/2024   PSA 1.03 01/29/2023   PSA 1.03 01/23/2022    Lab Results  Component Value Date   HGBA1C 5.8 02/22/2024    Discussed the use of AI scribe software for clinical note transcription with the patient, who gave verbal consent to proceed.  History of Present Illness Curtis Luna is a 63 year old male with diabetes and hyperlipidemia who presents for a follow-up on his A1c and cholesterol levels.  He has been off his statin for approximately six months to observe any changes in his cholesterol levels. He is interested in checking his A1c and lipid levels while fasting. He has made dietary changes, including reducing carbohydrate and sugar intake, and has been monitoring his neuropathy symptoms.  He noticed an improvement in his neuropathy symptoms when consuming a  drink called Body Armor, which contains magnesium . He is interested in checking his magnesium  levels as he believes it may be contributing to the improvement of his symptoms.  He underwent cataract surgery three weeks ago and reports excellent vision outcomes, achieving 20/20 vision without the need for glasses. He also had a colonoscopy two months ago.  He has been engaging in regular physical activity, primarily walking and strength training. He reports no chest pain or shortness of breath during exercise.  He has been experiencing soreness in his hand for about a week, which he attributes to repetitive squeezing while watering his yard. He has leftover meloxicam  from a previous prescription for back pain and inquires about its use for his hand pain. He confirms he has no need for a refill at this time.     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   Seasonal affective disorder 10/02/2014   Internal hemorrhoid 04/20/2014   Chronic low back pain 04/20/2014   Anxiety 04/20/2014   Esophageal reflux 04/04/2011   Diverticulosis of colon 04/04/2011  Past Medical History:  Diagnosis Date   Abdominal pain, right lower quadrant    Acute bronchospasm    Allergy    Anxiety    on and off   Backache, unspecified    Cataract    Chicken pox    Diverticulosis of colon (without mention of hemorrhage)    Esophageal reflux    External hemorrhoids without mention of complication    thrombosed   Hiatal hernia    Hyperlipidemia    no medicines    Inguinal hernia without mention of  obstruction or gangrene, unilateral or unspecified, (not specified as recurrent)    Migraine    Neuropathy    Obstructive sleep apnea (adult) (pediatric)    uses oral appliance   Other symptoms involving digestive system(787.99)    Pain in joint, forearm    Pain in joint, shoulder region    Seasonal allergies    Sleep apnea    no cpap but uses dental device    Thoracic or lumbosacral neuritis or radiculitis, unspecified     Past Surgical History:  Procedure Laterality Date   COLON RESECTION  10/06   sigmoid colon   COLONOSCOPY     INGUINAL HERNIA REPAIR  12/2012   Left   UPPER GASTROINTESTINAL ENDOSCOPY     WISDOM TOOTH EXTRACTION      Social History   Tobacco Use   Smoking status: Former    Current packs/day: 0.00    Types: Cigarettes, Cigars    Quit date: 10/04/1987    Years since quitting: 37.0   Smokeless tobacco: Never  Vaping Use   Vaping status: Never Used  Substance Use Topics   Alcohol use: Yes    Alcohol/week: 14.0 standard drinks of alcohol    Types: 14 Standard drinks or equivalent per week    Comment: 2 daily    Drug use: No    Family History  Problem Relation Age of Onset   Lung cancer Mother 41       Deceased   Other Father        MVA   Aneurysm Sister        Brain   Healthy Brother        x2   Heart disease Paternal Grandmother    Heart attack Paternal Grandmother    Allergies Daughter        x2   Migraines Daughter        x1   Diabetes Maternal Aunt    Neuropathy Other    Colon cancer Neg Hx    Colon polyps Neg Hx    Esophageal cancer Neg Hx    Rectal cancer Neg Hx    Stomach cancer Neg Hx    Pancreatic cancer Neg Hx    Allergic rhinitis Neg Hx    Angioedema Neg Hx    Asthma Neg Hx    Atopy Neg Hx    Eczema Neg Hx    Immunodeficiency Neg Hx    Urticaria Neg Hx     Allergies  Allergen Reactions   Escitalopram Hives    Pt felt anxious  Pt felt his skin was on fire   Influenza Vaccines Other (See Comments)    Brain  fog, dizziness, tinnitus   Protonix  [Pantoprazole  Sodium] Other (See Comments)    Tingling of extremities    Medication list has been reviewed and updated.  Current Outpatient Medications on File Prior to Visit  Medication Sig Dispense Refill   Ascorbic Acid (VITAMIN C)  1000 MG tablet Take 1,000 mg by mouth daily.     BESIVANCE 0.6 % SUSP Apply 1 drop to eye 3 (three) times daily.     Bromfenac Sodium 0.07 % SOLN Apply 1 drop to eye 2 (two) times daily.     cholecalciferol (VITAMIN D3) 25 MCG (1000 UT) tablet Take 1,000 Units by mouth daily.     Difluprednate 0.05 % EMUL INSTILL 1 DROP INTO OPERATIVE EYE THREE TIMES DAILY STARTING 3 DAYS PRIOR TO SURGERY AND CONTINUE FOR 1 WEEK AFTER SURGERY, THEN 2 TIMES A DAY FOR 1 WEEK, THEN 1 TIME A DAY FOR 1 WEEK. THEN STOP     gabapentin  (NEURONTIN ) 100 MG capsule Take 1 capsule (100 mg total) by mouth 4 (four) times daily. 360 capsule 0   LORazepam  (ATIVAN ) 1 MG tablet Take 1 tablet (1 mg total) by mouth once daily as needed for anxiety. 30 tablet 0   b complex vitamins tablet Take 1 tablet by mouth daily. Every other day     No current facility-administered medications on file prior to visit.    Review of Systems:  As per HPI- otherwise negative.   Physical Examination: Vitals:   09/29/24 1322  BP: 136/74  Pulse: 71  Temp: 98.6 F (37 C)  SpO2: 97%   Vitals:   09/29/24 1322  Weight: 143 lb 9.6 oz (65.1 kg)  Height: 5' 5 (1.651 m)   Body mass index is 23.9 kg/m. Ideal Body Weight: Weight in (lb) to have BMI = 25: 149.9  GEN: no acute distress.  Normal weight, looks well HEENT: Atraumatic, Normocephalic.  Ears and Nose: No external deformity. CV: RRR, No M/G/R. No JVD. No thrill. No extra heart sounds. PULM: CTA B, no wheezes, crackles, rhonchi. No retractions. No resp. distress. No accessory muscle use. ABD: S, NT, ND, +BS. No rebound. No HSM. EXTR: No c/c/e PSYCH: Normally interactive. Conversant.    Assessment and  Plan: Prediabetes - Plan: Hemoglobin A1c  Hypercholesterolemia - Plan: Lipid panel  Other polyneuropathy - Plan: Basic metabolic panel with GFR, Magnesium   Assessment & Plan Pure hypercholesterolemia Off statin therapy for six months to assess impact on cholesterol levels. Engaged with lipid clinic and implementing lifestyle changes. - Order fasting lipid panel.  He plans to have this drawn in a couple of days  Prediabetes Due for A1c recheck to monitor status. Implementing dietary changes with reduced carbohydrate and sugar intake. - Order A1c test.  Peripheral neuropathy Symptom improvement with magnesium -containing drink. Interested in evaluating magnesium  levels. - Order magnesium  level test. - Consider over-the-counter magnesium  supplementation if levels are normal and symptoms improve.  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 Arthritic changes present, considering meloxicam  for relief. - Use meloxicam  as needed for pain. - Consider diclofenac (Voltaren) gel for additional relief.  Signed Harlene Schroeder, MD "

## 2024-09-24 NOTE — Patient Instructions (Incomplete)
It was great to see you again today, I will be in touch with your labs

## 2024-09-29 ENCOUNTER — Encounter: Payer: Self-pay | Admitting: Family Medicine

## 2024-09-29 ENCOUNTER — Ambulatory Visit: Admitting: Family Medicine

## 2024-09-29 VITALS — BP 136/74 | HR 71 | Temp 98.6°F | Ht 65.0 in | Wt 143.6 lb

## 2024-09-29 DIAGNOSIS — R7303 Prediabetes: Secondary | ICD-10-CM | POA: Diagnosis not present

## 2024-09-29 DIAGNOSIS — E7800 Pure hypercholesterolemia, unspecified: Secondary | ICD-10-CM

## 2024-09-29 DIAGNOSIS — G6289 Other specified polyneuropathies: Secondary | ICD-10-CM | POA: Diagnosis not present

## 2024-10-04 ENCOUNTER — Other Ambulatory Visit

## 2024-10-05 ENCOUNTER — Encounter: Payer: Self-pay | Admitting: Family Medicine

## 2024-10-05 ENCOUNTER — Other Ambulatory Visit (INDEPENDENT_AMBULATORY_CARE_PROVIDER_SITE_OTHER)

## 2024-10-05 ENCOUNTER — Other Ambulatory Visit (INDEPENDENT_AMBULATORY_CARE_PROVIDER_SITE_OTHER): Payer: Self-pay | Admitting: Nurse Practitioner

## 2024-10-05 DIAGNOSIS — J449 Chronic obstructive pulmonary disease, unspecified: Secondary | ICD-10-CM

## 2024-10-05 DIAGNOSIS — E7800 Pure hypercholesterolemia, unspecified: Secondary | ICD-10-CM | POA: Diagnosis not present

## 2024-10-05 DIAGNOSIS — G6289 Other specified polyneuropathies: Secondary | ICD-10-CM | POA: Diagnosis not present

## 2024-10-05 DIAGNOSIS — R7303 Prediabetes: Secondary | ICD-10-CM | POA: Diagnosis not present

## 2024-10-05 LAB — BASIC METABOLIC PANEL WITH GFR
BUN: 26 mg/dL — ABNORMAL HIGH (ref 6–23)
CO2: 29 meq/L (ref 19–32)
Calcium: 9.4 mg/dL (ref 8.4–10.5)
Chloride: 104 meq/L (ref 96–112)
Creatinine, Ser: 0.98 mg/dL (ref 0.40–1.50)
GFR: 81.56 mL/min (ref >60.00)
Glucose, Bld: 90 mg/dL (ref 70–99)
Potassium: 4.2 meq/L (ref 3.5–5.1)
Sodium: 141 meq/L (ref 135–145)

## 2024-10-05 LAB — LIPID PANEL
Cholesterol: 188 mg/dL (ref 0–200)
HDL: 52.9 mg/dL (ref >39.00)
LDL Cholesterol: 118 mg/dL — ABNORMAL HIGH (ref 0–99)
NonHDL: 134.92
Total CHOL/HDL Ratio: 4
Triglycerides: 83 mg/dL (ref 0.0–149.0)
VLDL: 16.6 mg/dL (ref 0.0–40.0)

## 2024-10-05 LAB — HEMOGLOBIN A1C: Hgb A1c MFr Bld: 5.9 % (ref 4.6–6.5)

## 2024-10-05 LAB — MAGNESIUM: Magnesium: 2.1 mg/dL (ref 1.5–2.5)

## 2024-10-07 ENCOUNTER — Encounter (INDEPENDENT_AMBULATORY_CARE_PROVIDER_SITE_OTHER): Payer: Self-pay | Admitting: Family Medicine

## 2024-10-07 ENCOUNTER — Other Ambulatory Visit (INDEPENDENT_AMBULATORY_CARE_PROVIDER_SITE_OTHER): Payer: Self-pay

## 2024-10-07 DIAGNOSIS — Z122 Encounter for screening for malignant neoplasm of respiratory organs: Secondary | ICD-10-CM

## 2024-10-07 DIAGNOSIS — F17210 Nicotine dependence, cigarettes, uncomplicated: Secondary | ICD-10-CM

## 2024-10-07 DIAGNOSIS — J329 Chronic sinusitis, unspecified: Secondary | ICD-10-CM

## 2024-10-07 DIAGNOSIS — J0110 Acute frontal sinusitis, unspecified: Secondary | ICD-10-CM | POA: Diagnosis not present

## 2024-10-09 MED ORDER — AMOXICILLIN 875 MG PO TABS: 875.0000 mg | ORAL_TABLET | Freq: Two times a day (BID) | ORAL | 0 refills | Status: AC

## 2024-10-09 NOTE — Addendum Note (Signed)
 Addended by: WATT RAISIN C on: 10/09/2024 07:38 AM   Modules accepted: Orders

## 2024-10-09 NOTE — Telephone Encounter (Signed)
 Please see the MyChart message reply(ies) for my assessment and plan.  The patient gave consent for this Medical Advice Message and is aware that it may result in a bill to their insurance company as well as the possibility that this may result in a co-payment or deductible. They are an established patient, but are not seeking medical advice exclusively about a problem treated during an in person or video visit in the last 7 days. I did not recommend an in person or video visit within 7 days of my reply.  I spent a total of 10 minutes cumulative time within 7 days through MyChart messaging Harlene Schroeder, MD

## 2024-10-12 ENCOUNTER — Encounter (INDEPENDENT_AMBULATORY_CARE_PROVIDER_SITE_OTHER): Payer: Self-pay

## 2024-10-12 ENCOUNTER — Other Ambulatory Visit: Payer: Self-pay

## 2024-10-12 ENCOUNTER — Ambulatory Visit: Payer: MEDICAID

## 2024-10-12 VITALS — BP 126/68 | HR 65 | Ht 68.0 in | Wt 193.4 lb

## 2024-10-12 DIAGNOSIS — Z8249 Family history of ischemic heart disease and other diseases of the circulatory system: Secondary | ICD-10-CM

## 2024-10-12 DIAGNOSIS — F17210 Nicotine dependence, cigarettes, uncomplicated: Secondary | ICD-10-CM

## 2024-10-12 DIAGNOSIS — I5030 Unspecified diastolic (congestive) heart failure: Secondary | ICD-10-CM | POA: Insufficient documentation

## 2024-10-12 DIAGNOSIS — R609 Edema, unspecified: Secondary | ICD-10-CM | POA: Insufficient documentation

## 2024-10-12 LAB — ECG 12 LEAD - (AMB USE ONLY)(MUSE, IN CLINIC)
Atrial Rate: 62 {beats}/min
Calculated P Axis: 81 degrees
Calculated R Axis: 98 degrees
Calculated T Axis: 61 degrees
PR Interval: 176 ms
QRS Duration: 106 ms
QT Interval: 454 ms
QTC Calculation: 460 ms
Ventricular rate: 62 {beats}/min

## 2024-10-12 MED ORDER — FUROSEMIDE 40 MG TABLET: 40.0000 mg | ORAL_TABLET | Freq: Every day | ORAL | 2 refills | Status: DC

## 2024-10-12 NOTE — Progress Notes (Signed)
 CARDIOLOGY, [facility]  [address]  [place] [address]  Operated by [facility]  Progress Note       Name:  Hector [name]   DOB: [birth year]   MRN: [id number]   Date:  10/12/2024     PCP: Tomasa JONELLE Hoar, MD     Chief Complaint: New Patient (Edema /CHF)      History of Present Illness    Hector [name] is a 62 y.o. male who presents for routine follow up evaluation.      Past medical history includes coronary artery calcifications on CT, carotid stenosis, hyperlipidemia, COPD, tobacco use, obesity.  He is a former alcoholic.  Transthoracic echocardiogram in June 2024 documented mild LVH, preserved EF, without significant valvular disease.    He presented to his PCP's office where he reported these complaints.  Orders were placed for a proBNP for further evaluation.  Pro BNP was in fact elevated at 1190.  Patient was initiated on Lasix  20 mg daily.    Today, patient reports chest pain on exertion and at rest. He reports chronic, unchanged shortness of breath due to COPD. He reports rare episodes of dizziness that occur with quick position change. Patient denies syncope, near syncope, palpitations.      Past Medical History:   Diagnosis Date    Alcohol abuse     Bladder cancer     Cancer (CMS [facility])     Carotid stenosis     right side stent    Chronic obstructive airway disease     Cirrhosis     Closed fracture of transverse process of thoracic vertebra 05/01/2021    Coma     after fall due to alcholism May 2022    Delirium, withdrawal, alcoholic (CMS HCC)     Depression     Encephalopathy 05/15/2021    Esophageal reflux     H/O urinary tract infection     8/18 per pt    Heart murmur     HTN (hypertension)     Hyperlipidemia     Ileus (CMS HCC) 05/18/2021    Left pulmonary contusion 05/01/2021    Leukopenia 05/18/2021    Multiple rib fractures 05/01/2021    Oxygen  dependent     Pleural effusion 05/11/2021    Serum ammonia increased (CMS HCC) 05/15/2021    Shortness of breath     Spleen  injury 05/01/2021    Status post thoracentesis 05/19/2021    Thrombocytopenia (CMS HCC) 05/01/2021    Unknown cause of injury     fx nose     Patient Active Problem List    Diagnosis Date Noted    Edema 08/10/2024    Gross hematuria 02/28/2024    History of bladder cancer 02/27/2024    Pulmonary HTN (CMS HCC) 02/11/2024    Chronic respiratory failure with hypoxia (CMS HCC) 01/18/2024    Acute cystitis without hematuria 01/16/2024    Bladder cancer 01/16/2024    Inability to perform activities of daily living 01/15/2024    AKI (acute kidney injury) (CMS HCC) 01/06/2024    COPD exacerbation (CMS HCC) 01/05/2024    Urinary frequency 10/20/2023    Hematuria 10/20/2023    Carotid stenosis, asymptomatic, right 06/29/2023    Carotid stenosis, right 06/03/2023    Coronary artery calcification seen on CT scan 06/03/2023    Mental disorder 09/25/2021    Dyslipidemia 09/25/2021    Annual physical exam 09/25/2021    Chronic obstructive pulmonary disease  06/18/2021    Tobacco use disorder 05/29/2021    Cirrhosis of liver without ascites (CMS HCC) 05/01/2021    Alcohol abuse 05/01/2021      Past Surgical History:   Procedure Laterality Date    CAROTID STENT      COLONOSCOPY  08/2021    ESOPHAGOGASTRODUODENOSCOPY  08/2021    HX HEART CATHETERIZATION      HX OTHER Right 06/29/2023    TCAR    HX TONSILLECTOMY      OTHER SURGICAL HISTORY      HX of plastic surgery to right head     Current Outpatient Medications   Medication Sig    albuterol  sulfate (VENTOLIN  HFA) 90 mcg/actuation Inhalation oral inhaler Take 2 Puffs by inhalation Every 6 hours as needed for Other (SHORTNESS OF BREATH,COUGH OR WHEEZING)    fluticasone  propion-salmeteroL (ADVAIR  HFA) 230-21 mcg/actuation Inhalation oral inhaler Take 2 Puffs by inhalation Twice daily RINSE MOUTH WITH WATER  AFTER INHALER USE    furosemide  (LASIX ) 20 mg Oral Tablet Take 1 Tablet (20 mg total) by mouth Daily for 90 days    ipratropium-albuterol  0.5 mg-3 mg(2.5 mg base)/3 mL Solution for  Nebulization TAKE 3 ML BY NEBULIZATION THREE TIMES A DAY AS NEEDED FOR WHEEZING    OLANZapine  (ZYPREXA ) 10 mg Oral Tablet Take 1 Tablet (10 mg total) by mouth Every night    oxygen  (O2) Inhalation gas Administer 4 L/min into affected nostril(s) continuous    pantoprazole  (PROTONIX ) 40 mg Oral Tablet, Delayed Release (E.C.) TAKE 1 TABLET (40 MG TOTAL) BY MOUTH TWICE A DAY 30 MINTUTES BEFORE MEALS FOR 90 DAYS (Patient not taking: Reported on 10/12/2024)    rosuvastatin  (CRESTOR ) 10 mg Oral Tablet Take 1 Tablet (10 mg total) by mouth Every evening    sertraline  (ZOLOFT ) 100 mg Oral Tablet Take 1 Tablet (100 mg total) by mouth Daily Pm    tamsulosin  (FLOMAX ) 0.4 mg Oral Capsule TAKE 1 CAPSULE (0.4 MG TOTAL) BY MOUTH EVERY EVENING AFTER DINNER (Patient not taking: Reported on 10/12/2024)    traZODone  (DESYREL ) 50 mg Oral Tablet Take 1 Tablet (50 mg total) by mouth Every night Unsure dosage      Allergies[1]  Family Medical History:       Problem Relation (Age of Onset)    Asthma Father    Cerebral Aneurysm Sister    Dementia Mother    Heart Attack Paternal Grandmother (67)          Social History     Socioeconomic History    Marital status: Single     Spouse name: vickie    Number of children: 2    Years of education: 12    Highest education level: Not on file   Occupational History    Not on file   Tobacco Use    Smoking status: Every Day     Current packs/day: 1.00     Average packs/day: 1 pack/day for 51.8 years (51.8 ttl pk-yrs)     Types: Cigarettes     Start date: 1974    Smokeless tobacco: Never    Tobacco comments:     1800 quit now   Vaping Use    Vaping status: Never Used   Substance and Sexual Activity    Alcohol use: Not Currently     Alcohol/week: 15.0 standard drinks of alcohol     Types: 15 Cans of beer per week     Comment:  may 17th, 2022     Drug  use: Yes     Types: Marijuana     Comment:  a joint or 2 a day     Sexual activity: Yes   Other Topics Concern    Ability to Walk 1 Flight of Steps without  SOB/CP No     Comment: SOB    Routine Exercise Not Asked    Ability to Walk 2 Flight of Steps without SOB/CP No     Comment: SOB & CP    Unable to Ambulate Not Asked    Total Care Not Asked    Ability To Do Own ADL's Yes    Uses Walker Not Asked    Other Activity Level Not Asked    Uses Cane Not Asked   Social History Narrative    Not on file     Social Determinants of Health     Financial Resource Strain: Low Risk (05/02/2024)    Financial Resource Strain     SDOH Financial: No   Transportation Needs: Low Risk (05/02/2024)    Transportation Needs     SDOH Transportation: No   Social Connections: Low Risk (02/26/2024)    Social Connections     SDOH Social Isolation: 5 or more times a week   Intimate Partner Violence: Low Risk (05/02/2024)    Intimate Partner Violence     SDOH Domestic Violence: No   Housing Stability: Low Risk (05/02/2024)    Housing Stability     SDOH Housing Situation: I have housing.     SDOH Housing Worry: No       Review of Systems    As per HPI; all other systems that were reviewed are negative.    Physical Exam  BP 126/68 (Site: Left Arm, Patient Position: Sitting)   Pulse 65   Ht 1.727 m (5' 8)   Wt 87.7 kg (193 lb 6.4 oz)   SpO2 95%   BMI 29.41 kg/m            General:  Appears well-developed, well-nourished, and well-groomed.  Appears stated age.  No acute distress.  Head:  Normocephalic and atraumatic.  Eyes:  Sclera, conjunctiva, and lids normal bilaterally.  EOMI.  ENT:  No thyromegaly, trachea midline.  Cardiac:  regular rate and rhythm, S1, S2 normal, no murmur, click, rub or gallop  Vascular:  No carotid bruits, no JVD. No clubbing, cyanosis, varicosities, or edema.  Pulmonary:  Clear to auscultation bilaterally.  Respiratory effort non-labored, chest wall expansion symmetrical.  Abdomen:  Symmetric without distention.  Neurologic:  Gait steady.  No focal deficits noted.  Skin:  Pink, warm, dry, and intact.  Psychiatric:  Alert and oriented to person, place, time, and situation.   Mood and affect good.  Normal judgment.    Wt Readings from Last 3 Encounters:   10/12/24 87.7 kg (193 lb 6.4 oz)   08/18/24 82 kg (180 lb 12.4 oz)   08/10/24 86.6 kg (191 lb)     BP Readings from Last 3 Encounters:   10/12/24 126/68   08/18/24 121/60   08/10/24 130/70     Diagnostic Tests    05/2023 TTE  Conclusions:  Normal left ventricular size.  Mild left ventricular hypertrophy.  Left ventricular systolic function is normal.  The left ventricular ejection fraction by visual assessment is estimated to be 55-60%.  Left ventricular diastolic parameters are normal.  Trace tricuspid regurgitation present.    Lab Studies    CBC  Diff   Lab Results   Component  Value Date/Time    WBC 2.1 (L) 08/10/2024 04:14 PM    HGB 8.9 (L) 08/10/2024 04:14 PM    HCT 26.7 (L) 08/10/2024 04:14 PM    PLTCNT 76 (L) 08/10/2024 04:14 PM    RBC 3.18 (L) 08/10/2024 04:14 PM    MCV 83.9 08/10/2024 04:14 PM    MCHC 33.4 08/10/2024 04:14 PM    MCH 28.0 08/10/2024 04:14 PM    RDW 24.4 (H) 08/10/2024 04:14 PM    MPV 9.1 08/10/2024 04:14 PM    Lab Results   Component Value Date/Time    PMNS 60.6 02/29/2024 03:38 AM    LYMPHOCYTES 24 02/27/2024 04:12 AM    EOSINOPHIL 2 02/27/2024 04:12 AM    MONOCYTES 10.1 02/29/2024 03:38 AM    BASOPHILS 0.8 02/29/2024 03:38 AM    BASOPHILS <0.10 02/29/2024 03:38 AM    PMNABS 2.28 02/29/2024 03:38 AM    LYMPHSABS 0.98 (L) 02/29/2024 03:38 AM    EOSABS <0.10 02/29/2024 03:38 AM    MONOSABS 0.38 02/29/2024 03:38 AM        Lab Results   Component Value Date    SODIUM 138 08/10/2024    POTASSIUM 4.0 08/10/2024    CHLORIDE 110 (H) 08/10/2024    CO2 21 (L) 08/10/2024    ANIONGAP 7 08/10/2024    BUN 14 08/10/2024    CREATININE 1.12 08/10/2024    GLUCOSENF 81 08/10/2024    GLUCOSE neg 06/29/2024    CALCIUM 8.1 (L) 08/10/2024    PHOSPHORUS 2.6 01/06/2024    ALBUMIN  2.9 (L) 08/10/2024    TOTALPROTEIN 6.2 (L) 08/10/2024    ALKPHOS 148 (H) 08/10/2024    AST 47 08/10/2024    ALT 19 08/10/2024    GFR >60 08/10/2024     Lab  Results   Component Value Date    MAGNESIUM  1.8 01/15/2024     Lab Results   Component Value Date    CHOLESTEROL 161 04/13/2023    HDLCHOL 32 (L) 04/13/2023    LDLCHOL 114 (H) 04/13/2023    TRIG 74 04/13/2023      Lab Results   Component Value Date    HA1C 5.3 05/02/2021     Lab Results   Component Value Date    TSH 4.130 05/02/2021     Orders     Orders Placed This Encounter    ECG (Clinic Performed)     There are no discontinued medications.    Assessment and Plan      ICD-10-CM    1. Heart failure with preserved ejection fraction (HFpEF), unspecified HF chronicity  I50.30 ECG (Clinic Performed)      2. Edema, unspecified type  R60.9 ECG (Clinic Performed)        Orders placed for transthoracic echocardiogram for further evaluation of symptoms.  Patient with +3 pitting edema to lower extremities.  Orders also placed for MPS for further evaluation of chest pain.  Educated patient Lasix  from 20 mg to 40 mg daily.  We will obtain BMP and proBNP in 1-2 weeks after increase in medication.  Educated patient to monitor weights at home and to notify this clinic for a weight gain of 3 lb in 1 day or 5 lb in 1 week.  We will continue to monitor.    Follow Up Instructions    Return to the clinic in 6 weeks or sooner as symptoms warrant.      37 minutes were spent in direct care of this patient including evaluation,  review of laboratory, radiology, and diagnostic studies, review of medical record, order entry, coordination of care, and counseling/education.      Lum Cea, APRN, CNP   [facility] Advanced Practice Professional       A portion of this documentation may have been generated using [facility] voice recognition software and may contain syntax/voice recognition errors.         [1]   Allergies  Allergen Reactions    Bee Venom Protein (Honey Bee)      Bee stings

## 2024-10-16 NOTE — Progress Notes (Deleted)
 [facility] [address], [address] [place], [place] [address] [phone]  Date:  10/19/2024   Name:  Curtis Luna   DOB:  02/10/1961   MRN:  [id number]  PCP:  Watt Harlene BROCKS, MD    Chief Complaint: No chief complaint on file.   History of Present Illness:  Curtis Luna is a 63 y.o. very pleasant male patient who presents with the following:  Pt seen today as he has continued to feel ill since he recently reached out to me about a week ago with concern of sinusitis sx, I sent in an rx for amox at that time He started the amoxicllin on   Discussed the use of AI scribe software for clinical note transcription with the patient, who gave verbal consent to proceed.  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   Seasonal affective disorder 10/02/2014   Internal hemorrhoid 04/20/2014   Chronic low back pain 04/20/2014   Anxiety 04/20/2014   Esophageal reflux 04/04/2011   Diverticulosis of colon 04/04/2011    Past Medical History:  Diagnosis Date   Abdominal pain, right lower quadrant    Acute bronchospasm    Allergy    Anxiety    on and off   Backache, unspecified    Cataract    Chicken pox    Diverticulosis of colon (without mention of hemorrhage)    Esophageal reflux    External hemorrhoids without mention of complication    thrombosed   Hiatal hernia    Hyperlipidemia    no medicines    Inguinal hernia without mention of obstruction or gangrene, unilateral or  unspecified, (not specified as recurrent)    Migraine    Neuropathy    Obstructive sleep apnea (adult) (pediatric)    uses oral appliance   Other symptoms involving digestive system(787.99)    Pain in joint, forearm    Pain in joint, shoulder region    Seasonal allergies    Sleep apnea    no cpap but uses dental device    Thoracic or lumbosacral neuritis or radiculitis, unspecified     Past Surgical History:  Procedure Laterality Date   COLON RESECTION  10/06   sigmoid colon   COLONOSCOPY     INGUINAL HERNIA REPAIR  12/2012   Left   UPPER GASTROINTESTINAL ENDOSCOPY     WISDOM TOOTH EXTRACTION      Social History   Tobacco Use   Smoking status: Former    Current packs/day: 0.00    Types: Cigarettes, Cigars    Quit date: 10/04/1987    Years since quitting: 37.0   Smokeless tobacco: Never  Vaping Use   Vaping status: Never Used  Substance Use Topics   Alcohol use: Yes    Alcohol/week: 14.0 standard drinks of alcohol    Types: 14 Standard drinks or equivalent per week    Comment: 2 daily    Drug use: No    Family History  Problem Relation Age  of Onset   Lung cancer Mother 28       Deceased   Other Father        MVA   Aneurysm Sister        Brain   Healthy Brother        x2   Heart disease Paternal Grandmother    Heart attack Paternal Grandmother    Allergies Daughter        x2   Migraines Daughter        x1   Diabetes Maternal Aunt    Neuropathy Other    Colon cancer Neg Hx    Colon polyps Neg Hx    Esophageal cancer Neg Hx    Rectal cancer Neg Hx    Stomach cancer Neg Hx    Pancreatic cancer Neg Hx    Allergic rhinitis Neg Hx    Angioedema Neg Hx    Asthma Neg Hx    Atopy Neg Hx    Eczema Neg Hx    Immunodeficiency Neg Hx    Urticaria Neg Hx     Allergies  Allergen Reactions   Escitalopram Hives    Pt felt anxious  Pt felt his skin was on fire   Influenza Vaccines Other (See Comments)    Brain fog, dizziness, tinnitus   Protonix   [Pantoprazole  Sodium] Other (See Comments)    Tingling of extremities    Medication list has been reviewed and updated.  Current Outpatient Medications on File Prior to Visit  Medication Sig Dispense Refill   amoxicillin  (AMOXIL ) 875 MG tablet Take 1 tablet (875 mg total) by mouth 2 (two) times daily. 20 tablet 0   Ascorbic Acid (VITAMIN C) 1000 MG tablet Take 1,000 mg by mouth daily.     b complex vitamins tablet Take 1 tablet by mouth daily. Every other day     BESIVANCE 0.6 % SUSP Apply 1 drop to eye 3 (three) times daily.     Bromfenac Sodium 0.07 % SOLN Apply 1 drop to eye 2 (two) times daily.     cholecalciferol (VITAMIN D3) 25 MCG (1000 UT) tablet Take 1,000 Units by mouth daily.     Difluprednate 0.05 % EMUL INSTILL 1 DROP INTO OPERATIVE EYE THREE TIMES DAILY STARTING 3 DAYS PRIOR TO SURGERY AND CONTINUE FOR 1 WEEK AFTER SURGERY, THEN 2 TIMES A DAY FOR 1 WEEK, THEN 1 TIME A DAY FOR 1 WEEK. THEN STOP     gabapentin  (NEURONTIN ) 100 MG capsule Take 1 capsule (100 mg total) by mouth 4 (four) times daily. 360 capsule 0   LORazepam  (ATIVAN ) 1 MG tablet Take 1 tablet (1 mg total) by mouth once daily as needed for anxiety. 30 tablet 0   No current facility-administered medications on file prior to visit.    Review of Systems:  As per HPI- otherwise negative.   Physical Examination: There were no vitals filed for this visit. There were no vitals filed for this visit. There is no height or weight on file to calculate BMI. Ideal Body Weight:    GEN: no acute distress. HEENT: Atraumatic, Normocephalic.  Ears and Nose: No external deformity. CV: RRR, No M/G/R. No JVD. No thrill. No extra heart sounds. PULM: CTA B, no wheezes, crackles, rhonchi. No retractions. No resp. distress. No accessory muscle use. ABD: S, NT, ND, +BS. No rebound. No HSM. EXTR: No c/c/e PSYCH: Normally interactive. Conversant.    Assessment and Plan: No diagnosis found.  Assessment &  Plan  Signed Harlene Schroeder, MD

## 2024-10-17 ENCOUNTER — Encounter: Payer: Self-pay | Admitting: Radiology

## 2024-10-17 ENCOUNTER — Ambulatory Visit (INDEPENDENT_AMBULATORY_CARE_PROVIDER_SITE_OTHER): Admitting: Family Medicine

## 2024-10-17 ENCOUNTER — Other Ambulatory Visit (HOSPITAL_BASED_OUTPATIENT_CLINIC_OR_DEPARTMENT_OTHER): Payer: Self-pay

## 2024-10-17 VITALS — BP 117/58 | HR 68 | Temp 98.7°F | Resp 16 | Ht 65.0 in | Wt 143.2 lb

## 2024-10-17 DIAGNOSIS — J0110 Acute frontal sinusitis, unspecified: Secondary | ICD-10-CM | POA: Diagnosis not present

## 2024-10-17 MED ORDER — PREDNISONE 20 MG PO TABS
ORAL_TABLET | ORAL | 0 refills | Status: AC
  Filled 2024-10-17: qty 9, 6d supply, fill #0

## 2024-10-17 NOTE — Progress Notes (Signed)
 [facility] [address] [place], [place] [address] [phone]  Date:  10/17/2024   Name:  Curtis Luna   DOB:  07/20/1961   MRN:  [id number]  PCP:  Watt Harlene BROCKS, MD    Chief Complaint: No chief complaint on file.   History of Present Illness:  Curtis Luna is a 63 y.o. very pleasant male patient who presents with the following:  Pt seen today as he has continued to feel ill since he recently reached out to me about a week ago with concern of sinusitis sx, I sent in an rx for amox at that time- history of coronary artery disease, ocular migraine, GERD, hyperlipidemia, mild prediabetes   He started the amoxicllin on 10/26- he has not noted much improvement His sinuses are feeling better but he still notes a lot of pressure behind his eyes and he has some cough as well   Discussed the use of AI scribe software for clinical note transcription with the patient, who gave verbal consent to proceed.  History of Present Illness Curtis Luna is a 63 year old male who presents with persistent sinus and chest symptoms.  He has been experiencing ongoing sinus and chest symptoms since Sunday, October 09, 2024, which have not improved despite treatment with amoxicillin . Initially, his sinuses seemed to improve, but he continues to experience significant discomfort, particularly at night and in the morning, with pain behind his eyes and nose.  The symptoms have progressed to his chest, accompanied by coughing. He recalls having similar symptoms two years ago, for which he was treated with a higher dose of antibiotics and possibly prednisone . He notes that his previous treatment was  amox 1000 mg twice daily, while this time he was prescribed a lower dose.  No fever, but he mentions a sore throat earlier in the course of his illness. He has tested for COVID-19 at home. He is concerned about the persistent nature of his symptoms  and the impact on his daily life, particularly at work where he is exposed to others who are also unwell.    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   Seasonal affective disorder 10/02/2014   Internal hemorrhoid 04/20/2014   Chronic low back pain 04/20/2014   Anxiety 04/20/2014   Esophageal reflux 04/04/2011   Diverticulosis of colon 04/04/2011    Past Medical History:  Diagnosis Date   Abdominal pain, right lower quadrant    Acute bronchospasm    Allergy    Anxiety    on and off   Backache, unspecified    Cataract    Chicken pox    Diverticulosis of colon (without mention of hemorrhage)    Esophageal reflux    External hemorrhoids without mention of complication    thrombosed   Hiatal hernia    Hyperlipidemia    no medicines    Inguinal hernia without mention of obstruction or gangrene, unilateral or unspecified, (not specified as recurrent)    Migraine    Neuropathy  Obstructive sleep apnea (adult) (pediatric)    uses oral appliance   Other symptoms involving digestive system(787.99)    Pain in joint, forearm    Pain in joint, shoulder region    Seasonal allergies    Sleep apnea    no cpap but uses dental device    Thoracic or lumbosacral neuritis or radiculitis, unspecified     Past Surgical History:  Procedure Laterality Date   COLON RESECTION  10/06   sigmoid colon   COLONOSCOPY     INGUINAL HERNIA REPAIR  12/2012   Left   UPPER GASTROINTESTINAL ENDOSCOPY     WISDOM TOOTH EXTRACTION      Social History   Tobacco Use   Smoking status:  Former    Current packs/day: 0.00    Types: Cigarettes, Cigars    Quit date: 10/04/1987    Years since quitting: 37.0   Smokeless tobacco: Never  Vaping Use   Vaping status: Never Used  Substance Use Topics   Alcohol use: Yes    Alcohol/week: 14.0 standard drinks of alcohol    Types: 14 Standard drinks or equivalent per week    Comment: 2 daily    Drug use: No    Family History  Problem Relation Age of Onset   Lung cancer Mother 55       Deceased   Other Father        MVA   Aneurysm Sister        Brain   Healthy Brother        x2   Heart disease Paternal Grandmother    Heart attack Paternal Grandmother    Allergies Daughter        x2   Migraines Daughter        x1   Diabetes Maternal Aunt    Neuropathy Other    Colon cancer Neg Hx    Colon polyps Neg Hx    Esophageal cancer Neg Hx    Rectal cancer Neg Hx    Stomach cancer Neg Hx    Pancreatic cancer Neg Hx    Allergic rhinitis Neg Hx    Angioedema Neg Hx    Asthma Neg Hx    Atopy Neg Hx    Eczema Neg Hx    Immunodeficiency Neg Hx    Urticaria Neg Hx     Allergies  Allergen Reactions   Escitalopram Hives    Pt felt anxious  Pt felt his skin was on fire   Influenza Vaccines Other (See Comments)    Brain fog, dizziness, tinnitus   Protonix  [Pantoprazole  Sodium] Other (See Comments)    Tingling of extremities    Medication list has been reviewed and updated.  Current Outpatient Medications on File Prior to Visit  Medication Sig Dispense Refill   amoxicillin  (AMOXIL ) 875 MG tablet Take 1 tablet (875 mg total) by mouth 2 (two) times daily. 20 tablet 0   Ascorbic Acid (VITAMIN C) 1000 MG tablet Take 1,000 mg by mouth daily.     BESIVANCE 0.6 % SUSP Apply 1 drop to eye 3 (three) times daily.     Bromfenac Sodium 0.07 % SOLN Apply 1 drop to eye 2 (two) times daily.     cholecalciferol (VITAMIN D3) 25 MCG (1000 UT) tablet Take 1,000 Units by mouth daily.     Difluprednate 0.05 % EMUL INSTILL 1 DROP INTO  OPERATIVE EYE THREE TIMES DAILY STARTING 3 DAYS PRIOR TO SURGERY AND CONTINUE FOR 1 WEEK AFTER SURGERY, THEN  2 TIMES A DAY FOR 1 WEEK, THEN 1 TIME A DAY FOR 1 WEEK. THEN STOP     gabapentin  (NEURONTIN ) 100 MG capsule Take 1 capsule (100 mg total) by mouth 4 (four) times daily. 360 capsule 0   LORazepam  (ATIVAN ) 1 MG tablet Take 1 tablet (1 mg total) by mouth once daily as needed for anxiety. 30 tablet 0   b complex vitamins tablet Take 1 tablet by mouth daily. Every other day     No current facility-administered medications on file prior to visit.    Review of Systems:  As per HPI- otherwise negative. BP Readings from Last 3 Encounters:  10/17/24 (!) 117/58  09/29/24 136/74  08/23/24 117/64     Physical Examination: Vitals:   10/17/24 1459  BP: (!) 117/58  Pulse: 68  Resp: 16  Temp: 98.7 F (37.1 C)  SpO2: 98%   Vitals:   10/17/24 1459  Weight: 143 lb 3.2 oz (65 kg)  Height: 5' 5 (1.651 m)   Body mass index is 23.83 kg/m. Ideal Body Weight: Weight in (lb) to have BMI = 25: 149.9  GEN: no acute distress. Normal weight, looks well  HEENT: Atraumatic, Normocephalic.  Bilateral TM wnl, oropharynx normal.  PEERL,EOMI.   Ears and Nose: No external deformity. CV: RRR, No M/G/R. No JVD. No thrill. No extra heart sounds. PULM: CTA B, no wheezes, crackles, rhonchi. No retractions. No resp. distress. No accessory muscle use. ABD: S, NT, ND, +BS. No rebound. No HSM. EXTR: No c/c/e PSYCH: Normally interactive. Conversant.    Assessment and Plan: Acute non-recurrent frontal sinusitis - Plan: predniSONE  (DELTASONE ) 20 MG tablet  Assessment & Plan Acute frontal sinusitis Persistent sinusitis symptoms with partial response to amoxicillin . Differential includes pneumonia, but unlikely without fever. - Prescribed prednisone : 2 pills daily for 3 days, then 1 pill daily for 3 days if needed. Advised morning intake best - Complete amoxicillin  course. - Offer chest x-ray if symptoms  persist or worsen.  Signed Harlene Schroeder, MD

## 2024-10-19 ENCOUNTER — Ambulatory Visit: Admitting: Family Medicine

## 2024-10-27 ENCOUNTER — Encounter: Payer: Self-pay | Admitting: Family Medicine

## 2024-10-27 ENCOUNTER — Ambulatory Visit (HOSPITAL_BASED_OUTPATIENT_CLINIC_OR_DEPARTMENT_OTHER)
Admission: RE | Admit: 2024-10-27 | Discharge: 2024-10-27 | Disposition: A | Discharge status: Home / Self Care | Source: Ambulatory Visit | Attending: Family Medicine | Admitting: Family Medicine

## 2024-10-27 DIAGNOSIS — J329 Chronic sinusitis, unspecified: Secondary | ICD-10-CM | POA: Diagnosis present

## 2024-10-27 NOTE — Addendum Note (Signed)
 Addended by: WATT RAISIN C on: 10/27/2024 12:37 PM   Modules accepted: Orders

## 2024-11-02 ENCOUNTER — Telehealth (HOSPITAL_COMMUNITY): Payer: Self-pay

## 2024-11-02 ENCOUNTER — Other Ambulatory Visit (INDEPENDENT_AMBULATORY_CARE_PROVIDER_SITE_OTHER): Payer: Self-pay | Admitting: Student in an Organized Health Care Education/Training Program

## 2024-11-02 DIAGNOSIS — C679 Malignant neoplasm of bladder, unspecified: Secondary | ICD-10-CM

## 2024-11-03 ENCOUNTER — Encounter (HOSPITAL_COMMUNITY): Payer: Self-pay

## 2024-11-03 ENCOUNTER — Ambulatory Visit (HOSPITAL_COMMUNITY)
Admission: RE | Admit: 2024-11-03 | Discharge: 2024-11-03 | Disposition: A | Discharge status: Home / Self Care | Payer: MEDICAID | Source: Ambulatory Visit

## 2024-11-03 ENCOUNTER — Ambulatory Visit (INDEPENDENT_AMBULATORY_CARE_PROVIDER_SITE_OTHER): Payer: Self-pay

## 2024-11-03 ENCOUNTER — Ambulatory Visit
Admission: RE | Admit: 2024-11-03 | Discharge: 2024-11-03 | Disposition: A | Discharge status: Home / Self Care | Payer: MEDICAID | Source: Ambulatory Visit

## 2024-11-03 ENCOUNTER — Other Ambulatory Visit: Payer: Self-pay

## 2024-11-03 DIAGNOSIS — R609 Edema, unspecified: Secondary | ICD-10-CM

## 2024-11-03 DIAGNOSIS — I5030 Unspecified diastolic (congestive) heart failure: Secondary | ICD-10-CM | POA: Insufficient documentation

## 2024-11-03 LAB — MYOCARDIAL PERFUSION COMPLETE
Baseline HR: 60 {beats}/min
Nuc Stress EF: 64 %
Peak Diastolic BP for Stress Tests: 78 mmHg
Peak Systolic BP Stress Test: 150 mmHg
Post peak HR: 70 {beats}/min
RPP: 10500
ST DEPRESSION - RECOVERY: 0
ST DEPRESSION - STRESS: 0 mm
TID: 1.08

## 2024-11-03 MED ORDER — REGADENOSON 0.4 MG/5 ML INTRAVENOUS SYRINGE
0.4000 mg | INJECTION | INTRAVENOUS | Status: AC
Start: 2024-11-03 — End: 2024-11-03
  Administered 2024-11-03: 0.4 mg via INTRAVENOUS

## 2024-11-04 DIAGNOSIS — I517 Cardiomegaly: Secondary | ICD-10-CM

## 2024-11-04 DIAGNOSIS — I081 Rheumatic disorders of both mitral and tricuspid valves: Secondary | ICD-10-CM

## 2024-11-04 LAB — TRANSTHORACIC ECHOCARDIOGRAM - ADULT
EF VISUAL ESTIMATE: 65
EF: 70

## 2024-11-07 ENCOUNTER — Encounter (INDEPENDENT_AMBULATORY_CARE_PROVIDER_SITE_OTHER): Payer: Self-pay | Admitting: Nurse Practitioner

## 2024-11-07 ENCOUNTER — Other Ambulatory Visit: Payer: Self-pay

## 2024-11-07 ENCOUNTER — Ambulatory Visit (INDEPENDENT_AMBULATORY_CARE_PROVIDER_SITE_OTHER): Payer: MEDICAID | Admitting: Nurse Practitioner

## 2024-11-07 VITALS — BP 140/72 | HR 124 | Temp 97.4°F | Resp 18 | Ht 68.0 in | Wt 191.0 lb

## 2024-11-07 DIAGNOSIS — Z7185 Encounter for immunization safety counseling: Secondary | ICD-10-CM

## 2024-11-07 DIAGNOSIS — F1290 Cannabis use, unspecified, uncomplicated: Secondary | ICD-10-CM

## 2024-11-07 DIAGNOSIS — J439 Emphysema, unspecified: Secondary | ICD-10-CM

## 2024-11-07 DIAGNOSIS — J449 Chronic obstructive pulmonary disease, unspecified: Secondary | ICD-10-CM

## 2024-11-07 DIAGNOSIS — Z9981 Dependence on supplemental oxygen: Secondary | ICD-10-CM

## 2024-11-07 DIAGNOSIS — Z716 Tobacco abuse counseling: Secondary | ICD-10-CM

## 2024-11-07 DIAGNOSIS — R0609 Other forms of dyspnea: Secondary | ICD-10-CM

## 2024-11-07 DIAGNOSIS — F17210 Nicotine dependence, cigarettes, uncomplicated: Secondary | ICD-10-CM

## 2024-11-07 MED ORDER — FLUTICASONE PROPIONATE 230 MCG-SALMETEROL 21 MCG/ACTUATION HFA INHALER
2.0000 | INHALATION_SPRAY | Freq: Two times a day (BID) | RESPIRATORY_TRACT | 5 refills | Status: AC
Start: 2024-11-07 — End: ?

## 2024-11-07 NOTE — Progress Notes (Signed)
 PULMONARY, [facility]  [address] [place]. [place] [address]      Pulmonary Clinic Follow Up    Patient Name: Hector [name]  Date: 11/07/2024  Department:  PULMONARY, [facility]  MRN: [id number]  DOB: 01-12-1961     6 MONTH FOLLOW-UP ENCOUNTER FOR Emphysema/COPD    History of Present Illness:  (11/07/2024)  Hector [name] is a 63 year old male who presents for a 6 month follow-up for Emphysema/COPD management.      Current Pulmonary Regimen:  Advair  230-21 mcg 2 inhalations twice a day  Albuterol  rescue inhaler 2 inhalations every 6 hours as needed,no required use  Ipratropium 0.5mg /Albuterol  2.5 mg every 6 hours as needed,I stopped taking nebulizer treatments,what is the use  Oxygen  5 liters per nasal cannula with exertion and at night at home,patient states he uses 5 liters,Order (05/23/2022) 4 liters ordered continuously    The patient did not wear oxygen  today to this encounter. Oximetry 92% on room air     The patient endorses that he has shortness of breath with exertion,oxygen  does help his breathing,cough daily,no wheezing.     The patient continues to smoke one pack of cigarettes daily,no intention to stop smoking.    No Emergency Department,No Urgent Care encounters,no antibiotics or steroids in the past 6 months.     CT of Chest  and abdomen(01/05/2024):  Rounded atelectasis at the medial right lower lobe with adjacent pleural thickening,linear atelectasis in the lingula,Chronic Emphysema,mild at the apex    History of Present Illness:  (04/26/2024)  The patient presents for a 6 month follow-up encounter for COPD.  The patient states that he uses his maintenance inhaler Advair   231-21 mcg two inhalations 3 to 7  times per week, rescue inhaler use Albuterol  2 inhalations usually daily, oxygen  4 Liters per nasal cannula continuously,with benefit of shortness of breath upon exertion patient did wear his oxygen  today's encounter, productive cough continues, no wheezing,  continues to smoke 1/2 to 1 pack of cigarettes per day as well as marijuana once daily,no interest to stop smoking.      History of Present Illness:  (10/28/2023)  Hector [name] is a 63 year old male who presents for a 6 month follow-up encounter for COPD management.  The patient reports no worsening of shortness of breath in the past 6 months, daily dry cough with occasional mucus production, inspiratory wheezing,.  The patient states he currently takes his DuoNeb treatments 3 times a day use of Advair  HFA 2 inhalations twice a day with required use of albuterol  rescue inhaler 2 times per day oxygen  supplement used 4 Liters per nasal cannula at night with benefit.  The patient endorses he continues to smoke 1 pack of small cigars per day and really does not desired to stopped smoking uses marijuana as well daily.  The patient remarks that his current respiratory medication is benefiting him and he does not feel a need to change anything,has been feeling good recently,lost 6 pounds.     History of Present Illness: (04/21/2023) Hector [name] is a 63 year old male who presents to the Pulmonary Clinic for a 6 month follow-up visit for COPD,nicotine  dependence.  The patient has a past medical history of alcohol abuse, chronic obstructive pulmonary disease, cirrhosis, after fall related to alcoholism (04/2021), delirium withdrawal from alcohol, depression, hypertension, nose fracture, nicotine  dependence.    The patient admits that he increased his smoking to one pack cigarettes  daily,two marijuana joints,uses oxygen  4 liters per nasal cannula continuously,Advair  HFA 231-14mcg two puffs twice a day and short acting bronchodilator Albuterol  twice a day,nebulizer treatment as needed,last treatment 2 days ago.    The patient remarks that he has shortness of breath upon exertion,no worse in the past 6 month,productive cough in the evening dime size of thick clear mucus with inspiratory wheezing.     Pulmonary Function Test  (September 10, 2021 Forced vital capacity 77% of predicted mild reduction, FEV1 78% of predicted mild reduction in FEV1 FVC ratio 75.91 within normal limits noted, reduction in diffusion 59% no gross evidence of obstructive lung disease, no hyperinflation, no significant improvement in the FEV1 with a bronchodilator no restrictive lung disease.       Low Dose CT of Chest for lung cancer screening (11/12/2022):  Negative results annual image (11/13/2023).      Medications    Current Outpatient Medications:     albuterol  sulfate (VENTOLIN  HFA) 90 mcg/actuation Inhalation oral inhaler, Take 2 Puffs by inhalation Every 6 hours as needed for Other (SHORTNESS OF BREATH,COUGH OR WHEEZING), Disp: 1 Each, Rfl: 5    fluticasone  propion-salmeteroL (ADVAIR  HFA) 230-21 mcg/actuation Inhalation oral inhaler, Take 2 Puffs by inhalation Twice daily RINSE MOUTH WITH WATER  AFTER INHALER USE, Disp: 1 g, Rfl: 5    furosemide  (LASIX ) 40 mg Oral Tablet, Take 1 Tablet (40 mg total) by mouth Daily for 90 days (Patient not taking: Reported on 11/07/2024), Disp: 30 Tablet, Rfl: 2    ipratropium-albuterol  0.5 mg-3 mg(2.5 mg base)/3 mL Solution for Nebulization, TAKE 3 ML BY NEBULIZATION THREE TIMES A DAY AS NEEDED FOR WHEEZING (Patient not taking: Reported on 11/07/2024), Disp: 270 mL, Rfl: 1    OLANZapine  (ZYPREXA ) 10 mg Oral Tablet, Take 1 Tablet (10 mg total) by mouth Every night, Disp: , Rfl:     oxygen  (O2) Inhalation gas, Administer 4 L/min into affected nostril(s) continuous, Disp: , Rfl:     pantoprazole  (PROTONIX ) 40 mg Oral Tablet, Delayed Release (E.C.), TAKE 1 TABLET (40 MG TOTAL) BY MOUTH TWICE A DAY 30 MINTUTES BEFORE MEALS FOR 90 DAYS (Patient not taking: Reported on 10/12/2024), Disp: 60 Tablet, Rfl: 3    rosuvastatin  (CRESTOR ) 10 mg Oral Tablet, Take 1 Tablet (10 mg total) by mouth Every evening, Disp: 90 Tablet, Rfl: 1    sertraline  (ZOLOFT ) 100 mg Oral Tablet, Take 1 Tablet (100 mg total) by mouth Daily Pm, Disp: , Rfl:      tamsulosin  (FLOMAX ) 0.4 mg Oral Capsule, TAKE 1 CAPSULE (0.4 MG TOTAL) BY MOUTH EVERY EVENING AFTER DINNER (Patient not taking: Reported on 10/12/2024), Disp: 90 Capsule, Rfl: 1    traZODone  (DESYREL ) 50 mg Oral Tablet, Take 1 Tablet (50 mg total) by mouth Every night Unsure dosage, Disp: , Rfl:   Allergies  Allergies   Allergen Reactions    Bee Venom Protein (Honey Bee)      Bee stings      Vitals  Vitals:    11/07/24 0928   BP: (!) 140/72   Pulse: (!) 124   Resp: 18   Temp: 36.3 C (97.4 F)   TempSrc: Thermal Scan   SpO2: 92%   Weight: 86.6 kg (191 lb)   Height: 1.727 m (5' 8)   BMI: 29.04        There are no exam notes on file for this visit.   Past Medical History  Past Medical History:   Diagnosis Date    Alcohol abuse  Bladder cancer     Cancer (CMS [facility])     Carotid stenosis     right side stent    Chronic obstructive airway disease     Cirrhosis     Closed fracture of transverse process of thoracic vertebra 05/01/2021    Coma     after fall due to alcholism May 2022    Delirium, withdrawal, alcoholic (CMS HCC)     Depression     Encephalopathy 05/15/2021    Esophageal reflux     H/O urinary tract infection     8/18 per pt    Heart murmur     HTN (hypertension)     Hyperlipidemia     Ileus (CMS HCC) 05/18/2021    Left pulmonary contusion 05/01/2021    Leukopenia 05/18/2021    Multiple rib fractures 05/01/2021    Oxygen  dependent     Pleural effusion 05/11/2021    Serum ammonia increased (CMS HCC) 05/15/2021    Shortness of breath     Spleen injury 05/01/2021    Status post thoracentesis 05/19/2021    Thrombocytopenia (CMS HCC) 05/01/2021    Unknown cause of injury     fx nose         Past Surgical History:   Procedure Laterality Date    CAROTID STENT      COLONOSCOPY  08/2021    COLONOSCOPY WITH COLD SNARE POLYPECTOMY N/A 10/29/2023    Performed by Rosalinda Guerry HERO, MD at [facility] OR ENDO    COLONOSCOPY WITH COLD SNARE POLYPECTOMY N/A 08/26/2021    Performed by Victory Norleen GRADE, MD at [facility] OR ENDO    DIAGNOSTIC  URETEROSCOPY Right 02/24/2024    Performed by Trellis Sieving, MD at [facility] OR 2 WEST    ESOPHAGOGASTRODUODENOSCOPY  08/2021    GASTROSCOPY WITH BIOPSY N/A 09/30/2022    Performed by Victory Norleen GRADE, MD at [facility] OR ENDO    GASTROSCOPY WITH BIOPSY N/A 08/26/2021    Performed by Victory Norleen GRADE, MD at [facility] OR ENDO    HX HEART CATHETERIZATION      HX OTHER Right 06/29/2023    TCAR    HX TONSILLECTOMY      OTHER SURGICAL HISTORY      HX of plastic surgery to right head    RESECTION TUMOR BLADDER TRANSURETHRAL N/A 08/18/2024    Performed by Morley, Chad Edwin, MD at [facility] OR 2 WEST    RESECTION TUMOR BLADDER TRANSURETHRAL N/A 04/20/2024    Performed by Trellis Sieving, MD at [facility] OR 2 WEST    RESECTION TUMOR BLADDER TRANSURETHRAL N/A 02/24/2024    Performed by Trellis Sieving, MD at [facility] OR 2 WEST    TCAR - IMAGING ONLY Right 06/29/2023    Performed by Gabriel Ade, MD at [facility] INVASIVE LABS      Family Medical History:       Problem Relation (Age of Onset)    Asthma Father    Cerebral Aneurysm Sister    Dementia Mother    Heart Attack Paternal Grandmother (22)            Social History     Socioeconomic History    Marital status: Single     Spouse name: vickie    Number of children: 2    Years of education: 12   Tobacco Use    Smoking status: Every Day     Current packs/day: 1.00     Average packs/day: 1 pack/day for 51.9 years (  51.9 ttl pk-yrs)     Types: Cigarettes     Start date: 1974    Smokeless tobacco: Never    Tobacco comments:     1800 quit now   Vaping Use    Vaping status: Never Used   Substance and Sexual Activity    Alcohol use: Not Currently     Alcohol/week: 15.0 standard drinks of alcohol     Types: 15 Cans of beer per week     Comment:  may 17th, 2022     Drug use: Yes     Types: Marijuana     Comment:  a joint or 2 a day     Sexual activity: Yes   Other Topics Concern    Ability to Walk 1 Flight of Steps without SOB/CP No     Comment: SOB    Ability to Walk 2 Flight of Steps without SOB/CP No     Comment:  SOB & CP    Ability To Do Own ADL's Yes     Social Determinants of Health     Financial Resource Strain: Low Risk (05/02/2024)    Financial Resource Strain     SDOH Financial: No   Transportation Needs: Low Risk (05/02/2024)    Transportation Needs     SDOH Transportation: No   Social Connections: Low Risk (02/26/2024)    Social Connections     SDOH Social Isolation: 5 or more times a week   Intimate Partner Violence: Low Risk (05/02/2024)    Intimate Partner Violence     SDOH Domestic Violence: No   Housing Stability: Low Risk (05/02/2024)    Housing Stability     SDOH Housing Situation: I have housing.     SDOH Housing Worry: No      Outpatient Medications:  Current Outpatient Medications   Medication Sig    albuterol  sulfate (VENTOLIN  HFA) 90 mcg/actuation Inhalation oral inhaler Take 2 Puffs by inhalation Every 6 hours as needed for Other (SHORTNESS OF BREATH,COUGH OR WHEEZING)    fluticasone  propion-salmeteroL (ADVAIR  HFA) 230-21 mcg/actuation Inhalation oral inhaler Take 2 Puffs by inhalation Twice daily RINSE MOUTH WITH WATER  AFTER INHALER USE    furosemide  (LASIX ) 40 mg Oral Tablet Take 1 Tablet (40 mg total) by mouth Daily for 90 days (Patient not taking: Reported on 11/07/2024)    ipratropium-albuterol  0.5 mg-3 mg(2.5 mg base)/3 mL Solution for Nebulization TAKE 3 ML BY NEBULIZATION THREE TIMES A DAY AS NEEDED FOR WHEEZING (Patient not taking: Reported on 11/07/2024)    OLANZapine  (ZYPREXA ) 10 mg Oral Tablet Take 1 Tablet (10 mg total) by mouth Every night    oxygen  (O2) Inhalation gas Administer 4 L/min into affected nostril(s) continuous    pantoprazole  (PROTONIX ) 40 mg Oral Tablet, Delayed Release (E.C.) TAKE 1 TABLET (40 MG TOTAL) BY MOUTH TWICE A DAY 30 MINTUTES BEFORE MEALS FOR 90 DAYS (Patient not taking: Reported on 10/12/2024)    rosuvastatin  (CRESTOR ) 10 mg Oral Tablet Take 1 Tablet (10 mg total) by mouth Every evening    sertraline  (ZOLOFT ) 100 mg Oral Tablet Take 1 Tablet (100 mg total) by mouth  Daily Pm    tamsulosin  (FLOMAX ) 0.4 mg Oral Capsule TAKE 1 CAPSULE (0.4 MG TOTAL) BY MOUTH EVERY EVENING AFTER DINNER (Patient not taking: Reported on 10/12/2024)    traZODone  (DESYREL ) 50 mg Oral Tablet Take 1 Tablet (50 mg total) by mouth Every night Unsure dosage       Review of system:  General:  No  fever, chills, fatigue,31 pound weight loss in 6 months, appetite change.  No cold sweats at night.  Cardiac:  No chest pain, pressure, palpitations, hands or ankle edema.  Respiratory:  Positive shortness of breath upon exertion,Productive evening cough,No wheezing.  No hemoptysis.  No snoring, gasping for air, nocturnal cough. No witnessed apnea.  Neurological:  No headache or dizziness.  HEENT:  No visual changes, Positive nasal congestion and post nasal drainage.  No neck stiffness, sore throat or swollen glands.  GI:  No dysphagia, nausea, vomiting, constipation, diarrhea.  No blood in stool.  Urinary:  No blood in urine.  Skin:  No rash.    PHYSICAL EXAMINATION:   Constitutional:  Patient alert and oriented, no acute distress, no comfortable appearing.Obese,odor of strong cigarette smoke  General appearance of the patient   HEENT:  Head is normocephalic, atraumatic.  Tympanic membranes are easily visualized , transparent and pearly gray in color with normal light reflex noted bilaterally.  Nasal turbinates are pink and moist without inflammation or erythema. Nasal vestibule free of rhinorrhea.   Nasal vestibule shows evidence of nicotine  stain mucosa  No tenderness upon palpation of sinus areas. Oral mucosa is pink and moist without lesions, no pharyngeal erythremia or exudate noted.  Trachea midline Neck supple without masses, no lymphadenopathy.  No jugular vein distention.  No thyromegaly.   Cardiovascular:   Apical regular rate, rhythm, normal S1, S2 heard, no Murmurs, rubs, or gallops auscultated.   No ankle edema.  Lungs:  Auscultation anterior and posterior, non labored respirations,18,no, rales, Thorax  is symmetrical, chest walls expand equally, no pain upon palpation of upper anterior thorax, expirations not prolonged, no intercostal retractions.  Effort good, No dullness upon percussion.Decreased air flow at the bilateral posterior bases  Abdomen: Abdomen soft, obese non-distended, non-tender.  Musc: Normal gait and station, no digital cyanosis or clubbing.  Skin:  Color flesh tone.  Skin warm and dry.  No ecchymosis seen, nail beds pink, no clubbing, capillary refill less than four seconds, no rash, lesion or ulcers.   Psych:  Normal affect, interaction, and speech, cooperative,patient has delayed responses to questions    Assessment:  (J43.9) Emphysema of lung  (primary encounter diagnosis)    (J44.9) Chronic obstructive pulmonary disease, unspecified COPD type (CMS HCC)    (F17.210) Cigarette smoker    (F12.90) Marijuana smoker    (Z71.6) Tobacco abuse counseling    (R06.09) Dyspnea on exertion    (Z99.81) Dependence on supplemental oxygen     (Z71.85) Immunization counseling       Plan:    History of COPD clinically stable with use of maintenance Advair  231/21 mcg 2 inhalations twice a day  FEV1 78%,FEV1/FVC  ratio 75.91%,No significant improvement in FEV1 post bronchodilator,Diffusion 59%  Very mild and proportionate reduction in both FEV and FEV1,ratio normal  No gross evidence of Obstructive disease    (01/05/2024) CT of Chest and Abdomen ( Emphysema,No acute findings in the chest    Again, smoking cessation discussed with the patient,risk of lung disease progression,potential cardiac events,patient states that he is not going to stop smoking  The patient is not interested in referral to Pulmonary Rehab at the [facility]  Continue  Advair  230-21mcg 2 puffs twice a day  Recommend Ipratropium 0.5mg  /Albuterol  2.5mg  nebulizer treatments every 6 hours as needed  Recommend oxygen  4  liters per nasal cannula oxygen   with exertion and at night based on most current testing  The patient is up-to-date with  Pneumovax received (  07/13/2023).   Recommend Regular Dose Flu vaccine (Fall 2025)  Order for Low Dose CT of Chest for Lung Cancer screening (01/05/2025) per Lung Cancer Screening Department    The patient was given the opportunity to ask questions and those questions were answered to the patient's satisfaction. The patient was encouraged to call with any additional questions or concerns.   Discussed with patient effects and side effects of medications. Medication safety was discussed    On the day of the encounter, a total of 35 minutes was spent on this patient encounter including review of historical information, examination, documentation and post-visit activities. The time documented excludes procedural time.   Electronically signed by Romero LITTIE Engel, CFNP  Pulmonary

## 2024-11-13 ENCOUNTER — Other Ambulatory Visit: Payer: Self-pay

## 2024-11-21 ENCOUNTER — Ambulatory Visit
Payer: MEDICAID | Attending: Student in an Organized Health Care Education/Training Program | Admitting: Student in an Organized Health Care Education/Training Program

## 2024-11-21 ENCOUNTER — Other Ambulatory Visit: Payer: Self-pay

## 2024-11-21 ENCOUNTER — Encounter (INDEPENDENT_AMBULATORY_CARE_PROVIDER_SITE_OTHER): Payer: Self-pay

## 2024-11-21 ENCOUNTER — Encounter (INDEPENDENT_AMBULATORY_CARE_PROVIDER_SITE_OTHER): Payer: Self-pay | Admitting: Student in an Organized Health Care Education/Training Program

## 2024-11-21 DIAGNOSIS — C679 Malignant neoplasm of bladder, unspecified: Secondary | ICD-10-CM

## 2024-11-21 MED ORDER — LIDOCAINE 2 % MUCOSAL JELLY IN APPLICATOR: Status: AC

## 2024-11-21 NOTE — Procedures (Signed)
 UROLOGY, PHYSICIAN OFFICE CENTER  [address]  [place] [address]  Operated by [facility]  Procedure Note    Name: Jhayden Demuro MRN:  [id number]   Date: 11/21/2024 DOB:  Apr 20, 1961 (63 y.o.)         Cysto-Diagnostic    Performed by: Trellis Sieving, MD  Authorized by: Trellis Sieving, MD    Procedure discussed: discussed risks, benefits and alternatives    Chaperone present: yes    Timeout: timeout called immediately prior to procedure    Prep: patient was prepped and draped in usual sterile fashion    Prep type: Betadine    Anesthesia: local anesthesia      Procedure Details     Cystoscope type: flexible    Cystoscopy route: transurethral      Cystoscopy location: native bladder      Irrigation used: saline      Position: supine    Urethra     Urethra: normal      Prostate     Prostate comment: Moderate enlargement    Bladder     Bladder comment: Small recurrence above the right UO    Post-Procedure Details     Outcome: patient tolerated procedure well with no complications      Disposition: discharged home in satisfactory condition       Additional Details  Will set up for TURBT. Consent obtained today.          Sieving Trellis, MD

## 2024-11-21 NOTE — Nursing Note (Signed)
Patient arrived for scheduled cystoscopy for DX: hx of bladder cancer . The patient was unable to void prior to procedure for UA. Okay to continue per provider. The patient was first placed in the supine position. The patient's genitalia was prepped with Betadine using sterile technique. 2% Lidocaine gel topical local urojet given into patient's urethra. Surgeon performed Cystoscopy. Patient tolerated procedure without difficulty.     Patient also advised that after the procedure, the patient may note urinary frequency, urgency, hematuria, or dysuria for a day. Increasing fluid intake helps to flush out the bladder, but caffeinated, carbonated, or alcoholic beverages should be avoided because they may irritate the bladder lining. Signs of infection, such as fever, chills, low back pain, or persistent blood in the urine, should be reported to the clinic or to go to the Emergency Room for evaluation.      Patient dressed and escorted to exam room for Physician to review plan of care with patient.  Patient to RTC as instructed.    Delbert Phenix, RN

## 2024-11-21 NOTE — Addendum Note (Signed)
 Addended by: Mosiah Bastin on: 11/21/2024 04:25 PM     Modules accepted: Orders

## 2024-11-23 ENCOUNTER — Inpatient Hospital Stay (HOSPITAL_COMMUNITY): Admission: RE | Admit: 2024-11-23 | Discharge: 2024-11-23 | Payer: Self-pay

## 2024-11-23 ENCOUNTER — Encounter (HOSPITAL_COMMUNITY): Payer: Self-pay

## 2024-11-30 ENCOUNTER — Encounter (INDEPENDENT_AMBULATORY_CARE_PROVIDER_SITE_OTHER): Payer: Self-pay

## 2024-11-30 ENCOUNTER — Ambulatory Visit: Payer: MEDICAID

## 2024-11-30 ENCOUNTER — Other Ambulatory Visit: Payer: Self-pay

## 2024-11-30 VITALS — BP 128/62 | HR 64 | Ht 69.0 in | Wt 183.4 lb

## 2024-11-30 DIAGNOSIS — Z8249 Family history of ischemic heart disease and other diseases of the circulatory system: Secondary | ICD-10-CM

## 2024-11-30 DIAGNOSIS — I5030 Unspecified diastolic (congestive) heart failure: Secondary | ICD-10-CM | POA: Insufficient documentation

## 2024-11-30 DIAGNOSIS — F17210 Nicotine dependence, cigarettes, uncomplicated: Secondary | ICD-10-CM

## 2024-11-30 DIAGNOSIS — R0789 Other chest pain: Secondary | ICD-10-CM | POA: Insufficient documentation

## 2024-11-30 DIAGNOSIS — F1290 Cannabis use, unspecified, uncomplicated: Secondary | ICD-10-CM

## 2024-11-30 NOTE — Progress Notes (Signed)
 CARDIOLOGY, [facility] [address]  [place] [address]  Operated by [facility]  Progress Note       Name:  Hector [name]   DOB: Apr 18, 1961   MRN: [id number]   Date:  11/30/2024     PCP: Tomasa JONELLE Hoar, MD     Chief Complaint: Follow Up (6 week/HFpEF,Edema)      History of Present Illness    Huel Centola is a 63 y.o. male who presents for routine follow up evaluation.      Past medical history includes coronary artery calcifications on CT, carotid stenosis, hyperlipidemia, COPD, tobacco use, obesity.  He is a former alcoholic.  Transthoracic echocardiogram in June 2024 documented mild LVH, preserved EF, without significant valvular disease.    He presented to his PCP's office where he reported these complaints.  Orders were placed for a proBNP for further evaluation.  Pro BNP was in fact elevated at 1190.  Patient was initiated on Lasix  20 mg daily.    Patient underwent 10/2024 TTE which revealed LVEF of 65-70%, grade 2 diastolic dysfunction, mildly dilated right ventricle with normal right ventricular systolic function, severely dilated left atrial, mildly dilated right atrium, trace MR, trace PR, dilated IVC. MPS negative for reversible ischemia.     Today, patient reports chest pain on exertion and at rest. He reports chronic, unchanged shortness of breath due to COPD. He reports rare episodes of dizziness that occur with quick position change. Patient denies syncope, near syncope, palpitations.      Past Medical History:   Diagnosis Date    Alcohol abuse     Bladder cancer     Cancer (CMS [facility])     Carotid stenosis     right side stent    Chronic obstructive airway disease     Cirrhosis     Closed fracture of transverse process of thoracic vertebra 05/01/2021    Coma     after fall due to alcholism May 2022    Delirium, withdrawal, alcoholic (CMS HCC)     Depression     Encephalopathy 05/15/2021    Esophageal reflux     H/O urinary tract infection     8/18 per pt    Heart  murmur     HTN (hypertension)     Hyperlipidemia     Ileus (CMS HCC) 05/18/2021    Left pulmonary contusion 05/01/2021    Leukopenia 05/18/2021    Multiple rib fractures 05/01/2021    Oxygen  dependent     Pleural effusion 05/11/2021    Serum ammonia increased (CMS HCC) 05/15/2021    Shortness of breath     Spleen injury 05/01/2021    Status post thoracentesis 05/19/2021    Thrombocytopenia (CMS HCC) 05/01/2021    Unknown cause of injury     fx nose     Patient Active Problem List    Diagnosis Date Noted    (HFpEF) heart failure with preserved ejection fraction 11/30/2024    Atypical chest pain 11/30/2024    Edema 08/10/2024    Gross hematuria 02/28/2024    History of bladder cancer 02/27/2024    Pulmonary HTN (CMS HCC) 02/11/2024    Chronic respiratory failure with hypoxia (CMS HCC) 01/18/2024    Acute cystitis without hematuria 01/16/2024    Bladder cancer 01/16/2024    Inability to perform activities of daily living 01/15/2024    AKI (acute kidney injury) (CMS HCC) 01/06/2024    COPD exacerbation (CMS  HCC) 01/05/2024    Urinary frequency 10/20/2023    Hematuria 10/20/2023    Carotid stenosis, asymptomatic, right 06/29/2023    Carotid stenosis, right 06/03/2023    Mental disorder 09/25/2021    Dyslipidemia 09/25/2021    Annual physical exam 09/25/2021    Chronic obstructive pulmonary disease 06/18/2021    Tobacco use disorder 05/29/2021    Cirrhosis of liver without ascites (CMS HCC) 05/01/2021    Alcohol abuse 05/01/2021      Past Surgical History:   Procedure Laterality Date    CAROTID STENT      COLONOSCOPY  08/2021    ESOPHAGOGASTRODUODENOSCOPY  08/2021    HX HEART CATHETERIZATION      HX OTHER Right 06/29/2023    TCAR    HX TONSILLECTOMY      OTHER SURGICAL HISTORY      HX of plastic surgery to right head     Current Outpatient Medications   Medication Sig    albuterol  sulfate (VENTOLIN  HFA) 90 mcg/actuation Inhalation oral inhaler Take 2 Puffs by inhalation Every 6 hours as needed for Other (SHORTNESS OF  BREATH,COUGH OR WHEEZING)    fluticasone  propion-salmeteroL (ADVAIR  HFA) 230-21 mcg/actuation Inhalation oral inhaler Take 2 Puffs by inhalation Twice daily RINSE MOUTH WITH WATER  AFTER INHALER USE    furosemide  (LASIX ) 40 mg Oral Tablet Take 1 Tablet (40 mg total) by mouth Daily for 90 days    ipratropium-albuterol  0.5 mg-3 mg(2.5 mg base)/3 mL Solution for Nebulization TAKE 3 ML BY NEBULIZATION THREE TIMES A DAY AS NEEDED FOR WHEEZING    OLANZapine  (ZYPREXA ) 10 mg Oral Tablet Take 1 Tablet (10 mg total) by mouth Every night (Patient not taking: Reported on 11/30/2024)    oxygen  (O2) Inhalation gas Administer 4 L/min into affected nostril(s) continuous    pantoprazole  (PROTONIX ) 40 mg Oral Tablet, Delayed Release (E.C.) TAKE 1 TABLET (40 MG TOTAL) BY MOUTH TWICE A DAY 30 MINTUTES BEFORE MEALS FOR 90 DAYS (Patient not taking: Reported on 11/30/2024)    rosuvastatin  (CRESTOR ) 10 mg Oral Tablet Take 1 Tablet (10 mg total) by mouth Every evening    sertraline  (ZOLOFT ) 100 mg Oral Tablet Take 1 Tablet (100 mg total) by mouth Daily Pm    tamsulosin  (FLOMAX ) 0.4 mg Oral Capsule TAKE 1 CAPSULE (0.4 MG TOTAL) BY MOUTH EVERY EVENING AFTER DINNER    traZODone  (DESYREL ) 50 mg Oral Tablet Take 1 Tablet (50 mg total) by mouth Every night Unsure dosage      Allergies[1]  Family Medical History:       Problem Relation (Age of Onset)    Asthma Father    Cerebral Aneurysm Sister    Dementia Mother    Heart Attack Paternal Grandmother (62)          Social History     Socioeconomic History    Marital status: Single     Spouse name: vickie    Number of children: 2    Years of education: 12    Highest education level: Not on file   Occupational History    Not on file   Tobacco Use    Smoking status: Every Day     Current packs/day: 1.00     Average packs/day: 1 pack/day for 52.0 years (52.0 ttl pk-yrs)     Types: Cigarettes     Start date: 33    Smokeless tobacco: Never    Tobacco comments:     1800 quit now   Vaping Use  Vaping  status: Never Used   Substance and Sexual Activity    Alcohol use: Not Currently     Alcohol/week: 15.0 standard drinks of alcohol     Types: 15 Cans of beer per week     Comment:  may 17th, 2022     Drug use: Yes     Types: Marijuana     Comment:  a joint or 2 a day     Sexual activity: Yes   Other Topics Concern    Ability to Walk 1 Flight of Steps without SOB/CP No     Comment: SOB    Routine Exercise No    Ability to Walk 2 Flight of Steps without SOB/CP No     Comment: SOB & CP    Unable to Ambulate Not Asked    Total Care Not Asked    Ability To Do Own ADL's Yes    Uses Walker No    Other Activity Level Yes    Uses Cane No   Social History Narrative    Not on file     Social Determinants of Health     Financial Resource Strain: Low Risk (05/02/2024)    Financial Resource Strain     SDOH Financial: No   Transportation Needs: Low Risk (05/02/2024)    Transportation Needs     SDOH Transportation: No   Social Connections: Low Risk (02/26/2024)    Social Connections     SDOH Social Isolation: 5 or more times a week   Intimate Partner Violence: Low Risk (05/02/2024)    Intimate Partner Violence     SDOH Domestic Violence: No   Housing Stability: Low Risk (05/02/2024)    Housing Stability     SDOH Housing Situation: I have housing.     SDOH Housing Worry: No       Review of Systems    As per HPI; all other systems that were reviewed are negative.    Physical Exam  BP 128/62   Pulse 64   Ht 1.753 m (5' 9)   Wt 83.2 kg (183 lb 6.4 oz)   SpO2 92%   BMI 27.08 kg/m            General:  Appears well-developed, well-nourished, and well-groomed.  Appears stated age.  No acute distress.  Head:  Normocephalic and atraumatic.  Eyes:  Sclera, conjunctiva, and lids normal bilaterally.  EOMI.  ENT:  No thyromegaly, trachea midline.  Cardiac:  regular rate and rhythm, S1, S2 normal, no murmur, click, rub or gallop  Vascular:  No carotid bruits, no JVD. No clubbing, cyanosis, varicosities, or edema.  Pulmonary:  Clear to  auscultation bilaterally.  Respiratory effort non-labored, chest wall expansion symmetrical.  Abdomen:  Symmetric without distention.  Neurologic:  Gait steady.  No focal deficits noted.  Skin:  Pink, warm, dry, and intact.  Psychiatric:  Alert and oriented to person, place, time, and situation.  Mood and affect good.  Normal judgment.    Wt Readings from Last 3 Encounters:   11/30/24 83.2 kg (183 lb 6.4 oz)   11/21/24 80.3 kg (177 lb 0.5 oz)   11/07/24 86.6 kg (191 lb)     BP Readings from Last 3 Encounters:   11/30/24 128/62   11/07/24 (!) 140/72   10/12/24 126/68     Diagnostic Tests    05/2023 TTE  Conclusions:  Normal left ventricular size.  Mild left ventricular hypertrophy.  Left ventricular systolic function is normal.  The left ventricular ejection fraction by visual assessment is estimated to be 55-60%.  Left ventricular diastolic parameters are normal.  Trace tricuspid regurgitation present.    10/2024 MPS     Clinically and electrocardiographically negative regadenoson  ECG portion of the study.    Post-stress ejection fraction was 64%. The stress end diastolic cavity size is normal. The stress end systolic cavity size is normal.     IMPRESSION:  Impression:  Post-stress ejection fraction was 64%. The stress end diastolic cavity   size is normal. The stress end systolic cavity size is normal.     IMPRESSION:  Negative for reversible myocardial ischemia  LV EF is 64%    10/2024 TTE  Conclusions:  Normal left ventricular size with mild concentric left ventricular hypertrophy. Normal left ventricular systolic function with an ejection fraction of 65-70%. No regional wall motion abnormalities.  Grade 2 diastolic dysfunction.  Mildly dilated right ventricle with normal right ventricular systolic function. RVSP 46 mmHg.  Severely dilated left atrium  Mildly dilated right atrium  Trace mitral regurgitation  Mild tricuspid regurgitation  Trace pulmonic regurgitation  Dilated IVC with less than 50% inspiratory  collapse     Compared to prior echocardiogram there was evidence of grade 2 diastolic dysfunction with elevated RVSP.     Lab Studies    CBC  Diff   Lab Results   Component Value Date/Time    WBC 2.1 (L) 08/10/2024 04:14 PM    HGB 8.9 (L) 08/10/2024 04:14 PM    HCT 26.7 (L) 08/10/2024 04:14 PM    PLTCNT 76 (L) 08/10/2024 04:14 PM    RBC 3.18 (L) 08/10/2024 04:14 PM    MCV 83.9 08/10/2024 04:14 PM    MCHC 33.4 08/10/2024 04:14 PM    MCH 28.0 08/10/2024 04:14 PM    RDW 24.4 (H) 08/10/2024 04:14 PM    MPV 9.1 08/10/2024 04:14 PM    Lab Results   Component Value Date/Time    PMNS 60.6 02/29/2024 03:38 AM    LYMPHOCYTES 24 02/27/2024 04:12 AM    EOSINOPHIL 2 02/27/2024 04:12 AM    MONOCYTES 10.1 02/29/2024 03:38 AM    BASOPHILS 0.8 02/29/2024 03:38 AM    BASOPHILS <0.10 02/29/2024 03:38 AM    PMNABS 2.28 02/29/2024 03:38 AM    LYMPHSABS 0.98 (L) 02/29/2024 03:38 AM    EOSABS <0.10 02/29/2024 03:38 AM    MONOSABS 0.38 02/29/2024 03:38 AM        Lab Results   Component Value Date    SODIUM 138 08/10/2024    POTASSIUM 4.0 08/10/2024    CHLORIDE 110 (H) 08/10/2024    CO2 21 (L) 08/10/2024    ANIONGAP 7 08/10/2024    BUN 14 08/10/2024    CREATININE 1.12 08/10/2024    GLUCOSENF 81 08/10/2024    GLUCOSE neg 06/29/2024    CALCIUM 8.1 (L) 08/10/2024    PHOSPHORUS 2.6 01/06/2024    ALBUMIN  2.9 (L) 08/10/2024    TOTALPROTEIN 6.2 (L) 08/10/2024    ALKPHOS 148 (H) 08/10/2024    AST 47 08/10/2024    ALT 19 08/10/2024    GFR >60 08/10/2024     Lab Results   Component Value Date    MAGNESIUM  1.8 01/15/2024     Lab Results   Component Value Date    CHOLESTEROL 161 04/13/2023    HDLCHOL 32 (L) 04/13/2023    LDLCHOL 114 (H) 04/13/2023    TRIG 74 04/13/2023      Lab Results  Component Value Date    HA1C 5.3 05/02/2021     Lab Results   Component Value Date    TSH 4.130 05/02/2021     Orders     No orders of the defined types were placed in this encounter.    There are no discontinued medications.    Assessment and Plan      ICD-10-CM    1.  Heart failure with preserved ejection fraction (HFpEF), unspecified HF chronicity  I50.30       2. Atypical chest pain  R07.89         HFpEF: LVEF on TTE 10/2024: 65-70%. Grade II diastolic dysfunction. Patient reports unchanged shortness of breath and edema. Patient weight at last visit 193lbs, today 183. Weight on 12/8 177 lbs. Recommend increasing lasix  to 60 mg daily. Patient refuses medication changes and states he does not even want the current dose of lasix . Educated patient on risks and consequences. Patient voiced understanding.  Re-Educated patient to monitor weights at home and to notify this clinic for a weight gain of 3 lb in 1 day or 5 lb in 1 week.  We will continue to monitor.    Chest Pain: reviewed results of MPS with patient. Chest pain atypical in nature as it occurs at rest. Will continue to monitor.     Follow Up Instructions    Return to the clinic in 2 months or sooner as symptoms warrant.      24 minutes were spent in direct care of this patient including evaluation, review of laboratory, radiology, and diagnostic studies, review of medical record, order entry, coordination of care, and counseling/education.      Lum Cea, APRN, CNP   [facility] Advanced Practice Professional       A portion of this documentation may have been generated using [facility] voice recognition software and may contain syntax/voice recognition errors.         [1]   Allergies  Allergen Reactions    Bee Venom Protein (Honey Bee)      Bee stings

## 2024-12-01 ENCOUNTER — Ambulatory Visit: Payer: MEDICAID | Admitting: Family

## 2024-12-01 ENCOUNTER — Encounter (INDEPENDENT_AMBULATORY_CARE_PROVIDER_SITE_OTHER): Payer: Self-pay | Admitting: Family

## 2024-12-01 DIAGNOSIS — I6529 Occlusion and stenosis of unspecified carotid artery: Secondary | ICD-10-CM

## 2024-12-01 DIAGNOSIS — I509 Heart failure, unspecified: Secondary | ICD-10-CM

## 2024-12-01 DIAGNOSIS — J449 Chronic obstructive pulmonary disease, unspecified: Secondary | ICD-10-CM

## 2024-12-01 DIAGNOSIS — Z01818 Encounter for other preprocedural examination: Secondary | ICD-10-CM

## 2024-12-01 DIAGNOSIS — D494 Neoplasm of unspecified behavior of bladder: Secondary | ICD-10-CM

## 2024-12-01 DIAGNOSIS — I2510 Atherosclerotic heart disease of native coronary artery without angina pectoris: Secondary | ICD-10-CM

## 2024-12-01 DIAGNOSIS — K7460 Unspecified cirrhosis of liver: Secondary | ICD-10-CM

## 2024-12-01 DIAGNOSIS — D649 Anemia, unspecified: Secondary | ICD-10-CM

## 2024-12-01 DIAGNOSIS — I5030 Unspecified diastolic (congestive) heart failure: Secondary | ICD-10-CM

## 2024-12-01 DIAGNOSIS — F17200 Nicotine dependence, unspecified, uncomplicated: Secondary | ICD-10-CM

## 2024-12-01 NOTE — Patient Instructions (Signed)
 MEDICATION INSTRUCTIONS    Take the following medications the morning of surgery: inhalers/nebulizers    Hold the following medications the day of surgery: lasix     Hold NSAIDs (Motrin, Advil, Aleve, Ibuprofen, Naprosyn), vitamins, fish oil, and herbal supplements 7 days prior to the procedure.

## 2024-12-01 NOTE — H&P (Signed)
 Addendum:  none      [facility]  Department of Anesthesiology  Optimization Visit  History and Physical     Hector, Hector [name], 63 y.o. male Medical Record Number: [id number]   Date of Birth:  [birth year] Date of Service:  12/01/2024    Information Obtained from: patient Surgeon:  Surgeon(s):  Trellis Sieving, MD    Date of Surgery: 12/22/2024  Planned anesthesia: General   Estimated Case Length: 90 Min     TELEPHONE DOCUMENTATION:    Patient Location:  MyChart video visit from home address: [address]  [place] [address]    Patient/family aware of provider location:  yes  Patient/family consent for telemedicine:  yes  Examination observed and performed by:  [name], APRN, CNP    The patient/family initiated a request for telephone service.  Verbal consent for this service was obtained from the patient/family.      Reason for call: Pt unable to access camera for telemedicine appt    Total provider time spent with the patient on the phone: 18 minutes.              CHIEF COMPLAINT  Pre-Operative Optimization Visit for RESECTION TUMOR BLADDER TRANSURETHRAL: 47759 (CPT)  due to Bladder tumor [D49.4].        RECOMMENDATION  SUMMARY: The surgery is considered elective.  The surgery is considered low risk .  Recommend  proceeding towards surgery.    Patient has the following outstanding needs for optimization:   None  Patient would benefit from active incentive spirometer, early ambulation following surgery and multi-modal pain management approach as appropriate.  DVT prophylaxis as appropriate.  Please see addendum for any updates after 12/01/2024.        ASSESSMENT & PLAN  1.) Preop Exam  Plan: Proceed with surgery as scheduled. CXR and LABS ordered to be completed prior to surgery.     -Of note, PEC visit was completed via TELEPHONE CALL. Patient was not visualized during visit, and no physical exam completed.     EKG 10/12/2024  Normal sinus rhythm  Rightward axis  Borderline ECG    MPS  11/03/2024  IMPRESSION:  Negative for reversible myocardial ischemia  LV EF is 64%    ECHO 11/03/2024  Conclusions:  Normal left ventricular size with mild concentric left ventricular hypertrophy. Normal left ventricular systolic function with an ejection fraction of 65-70%. No regional wall motion abnormalities.  Grade 2 diastolic dysfunction.  Mildly dilated right ventricle with normal right ventricular systolic function. RVSP 46 mmHg.  Severely dilated left atrium  Mildly dilated right atrium  Trace mitral regurgitation  Mild tricuspid regurgitation  Trace pulmonic regurgitation  Dilated IVC with less than 50% inspiratory collapse    Carotid Duplex 01/28/2024  Conclusions: There is no evidence of a hemodynamically significant stenosis in the bilateral internal carotid arteries. Widely patent right ICA stent.     2.) Bladder Tumor  Assessment/Plan:  -Pt with persistent T1 high grade bladder cancer   -Last routine cysto revealed small recurrence.   -Scheduled for repeat TURBT in the near future.   -Proceed as scheduled. Preop plan as above.     3.) CAD  Assessment/ Plan:   -CAD seen on CT - no hx of cardiac cath.   -Patient reports chest pain and DOE that is chronic and unchanged.    -MPS 11/03/2024 - no ischemia.   -ECHO 11/03/2024 - EF 65-70%.  -BMP  ordered.   -Continue crestor .   -  Follow up with Cardiology as scheduled. Last appt 11/30/2024    4.) HFpEF   Assessment/ Plan:   -Patient reports chest pain and DOE that is chronic and unchanged.    -ECHO 11/03/2024 - EF 65-70%, grade 2 diastolic dysfunction.  -BMP  ordered.   -Continue lasix .   -CMP and NT-ProBNP ordered  -Follow up with Cardiology as scheduled.     5.) COPD  Assessment/ Plan:   -Denies recent URI or pneumonia.  -Lungs not assessed during appt due to telephone visit.    -CXR ordered.   -Continue inhalers/nebulizers.   -2L NC at night  -Based on the ARISCAT Score, Patient is at low risk for post operative pulmonary complications.   -Advise early  incentive spirometry and early mobilization to reduce atelectasis and pulmonary infection.   -Continue monitoring/management as directed by pulmonary.     6.) Smoker  Assessment/ Plan:   -Patient reports 1/2 ppd since age 13.   -States he is not currently interested in quitting at this time.  -Educated to hold a minimum of 8 hours prior to procedure.   -1-800-QUIT-NOW recommended.   -Encouragement given for smoking cessation.     7.) Carotid Stenosis  Assessment/Plan:  -Hx of right TCAR performed on June 29, 2023  -Denies any new TIA or stroke like symptoms.   -Reports no longer taking Plavix  or aspirin .  -Continue following with [facility] surgery at [facility]. Last OV 02/03/2024    8.) Liver Cirrhosis  - Etiology- alcohol  - MELD 3.0- 7 based on labs dated 02/26/2024 - per last GI note  -Unable to assess for ascites due to telephone visit. Patient does verbally report BLE edema.   -CMP ordered.   -Continue to follow with GI as scheduled.     9.) Anemia/Thrombocytopenia  Assessment/Plan   - Etiology:   - Anemia related to Chronic Disease.   - Currently prescribed: no treatment.  -Denies previous transfusion, blood in stool, dizziness, fatigue   - CBC ordered  - Recommend continued monitoring/management as directed by pcp.     10.)     MEDICATION INSTRUCTIONS    Take the following medications the morning of surgery: inhalers/nebulizers    Hold the following medications the day of surgery: lasix     Hold NSAIDs (Motrin, Advil, Aleve, Ibuprofen, Naprosyn), vitamins, fish oil, and herbal supplements 7 days prior to the procedure.          HPI  Hector [name] is a 63 y.o. male that presents with need for pre-operative optimization.      Patient with hx of persistent T1 high grade bladder cancer. Last cysto showed new recurrence. Patient is scheduled for repeat TURBT in the near future.       Operative Reference Anesthesia  ASA  3    METS  <4    History of difficult intubation No    Personal or family history of  anesthesia problems No    OSA  No      Operative Reference Surgery  Allergy to the following: latex, metal/nickel, iodine /shellfish, acrylic, egg/propofol , all opioids No    Recent Hospitalization (within the last 3 months) No    Current Tobacco Use  Yes, 1/2 ppd since age 18 - not interested in quitting    Current Alcohol Use Not currently, none for 2 month    Current Drug Use  Not currently, none for 2 month      Assessment Tools   Hx of Lung Disease  22 points  ARISCAT Score  Low risk  1.6% risk of in-hospital post-op pulmonary complications (composite including respiratory failure, respiratory infection, pleural effusion, atelectasis, pneumothorax, bronchospasm, aspiration pneumonitis)   Concern for Cardiac Ischemia Negative MPS 10/2024   History of PE/DVT and/or Clotting Disorder No   Frailty Score Yes: Frailty Score 2    Reference:     []  F= Fatigue in the past month? (More unusual fatigue than normal or inability to complete ADLs)  [x]  R= Resistance- difficulty climbing a flight of stairs?   [x]  A= Ambulation- difficulty walking one block on level land?  []   I= Illness (HTN, DM, Cancer (other than minor skin CA), Chronic Lung Disease, Heart Attack, CHF, Angina, Asthma, Arthritis, Stroke, CKD); 5+=1 point; <5=0  []   L= Loss of weight- have you lost >5% of your body weight in the past year?       STOP-BANG:       08/26/2021    11:49 AM 09/30/2022    10:05 AM 09/30/2022    10:26 AM 06/22/2023    10:28 AM 10/29/2023     9:23 AM   Stop Bang   Have you been Diagnosed with SLEEP APNEA (if yes do not need to continue Stop Bang) No No  No No   SNORING - do you snore loudly? (louder than talking or loud enough to be heard through closed doors) 1  1 1     TIRED - do you often feel tired, fatigued, or sleepy during daytime? 0  1 1    OBSERVED - has anyone observed you stop breathing during your sleep? 0  0 0    BLOOD PRESSURE - Do you have or are you being treated for high blood pressure? 0  1 1    BMI - greater than 35  kg/m2 ? 0 kg/m2  1 kg/m2 1 kg/m2    AGE - over 79 yr old? 1  1 1     NECK CIRCUMFERENCE - greater than 40 cm? 1  0 1    GENDER - Male? 1  1 1     STOP score 1  3 3     BANG score 3  3 4     STOP BANG score 4  6 7     Stop Bang Risk High Risk  High Risk High Risk          Past Medical History:  Past Medical History:   Diagnosis Date    Alcohol abuse     Bladder cancer     Cancer (CMS HCC)     Carotid stenosis     right side stent    Chronic obstructive airway disease     Cirrhosis     Closed fracture of transverse process of thoracic vertebra 05/01/2021    Coma     after fall due to alcholism May 2022    Delirium, withdrawal, alcoholic (CMS HCC)     Depression     Encephalopathy 05/15/2021    Esophageal reflux     H/O urinary tract infection     8/18 per pt    Heart murmur     HTN (hypertension)     Hyperlipidemia     Ileus (CMS HCC) 05/18/2021    Left pulmonary contusion 05/01/2021    Leukopenia 05/18/2021    Multiple rib fractures 05/01/2021    Oxygen  dependent     Pleural effusion 05/11/2021    Serum ammonia increased (CMS HCC) 05/15/2021    Shortness of breath  Spleen injury 05/01/2021    Status post thoracentesis 05/19/2021    Thrombocytopenia (CMS HCC) 05/01/2021    Unknown cause of injury     fx nose     Past Surgical History:   Procedure Laterality Date    CAROTID STENT      COLONOSCOPY  08/2021    ESOPHAGOGASTRODUODENOSCOPY  08/2021    HX HEART CATHETERIZATION      HX OTHER Right 06/29/2023    TCAR    HX TONSILLECTOMY      OTHER SURGICAL HISTORY      HX of plastic surgery to right head     Family Medical History:       Problem Relation (Age of Onset)    Asthma Father    Cerebral Aneurysm Sister    Dementia Mother    Heart Attack Paternal Grandmother (60)                Review of Systems:  Review of Systems   Constitutional: Negative for fever (last 7 days), chills (last 7 days) and fatigue.   Skin:  Negative for rash and open wound.   HENT: Positive for hearing loss. Negative for dental problems and difficulty  swallowing.   Exercise Tolerance: Positive for less than 4 METS.   Cardiovascular:  Positive for chest pain (recent MPS negative, follows with cardiology) and edema (lasix  daily). Negative for palpitations.   Respiratory:  Positive for oxygen  dependent (2L NC at night) and shortness of breath (with exertion).    Gastrointestinal:  Negative for heartburn, abdominal pain, diarrhea and constipation.   Genitourinary:  Positive for frequency. Negative for hematuria.   Musculoskeletal:  Positive for neck pain and back pain.   Family Anesthesia History: Negative for malignant hyperthermia precautions and pseudocholinesterase deficiency.   Anesthesia: Negative for difficult intubation and PONV.   Neurological:  Positive for dizziness, headaches and numbness (hands, chronic).   Psychiatric/Behavioral:  Positive for depression and physiological symptoms of anxiety.        Exam:  There were no vitals taken for this visit.      There is no height or weight on file to calculate BMI.           [name], APRN, CNP  12/01/2024, 14:25    Perioperative Evaluation Center  [place]  [facility]  [place]    It should be emphasized that the [facility] Provider consultation is to assess the patient's medical conditions and provide recommendations to optimize the patient to decrease peri-operative complications.  Decision on whether or not to proceed to surgery is made at the care team's discretion.

## 2024-12-06 ENCOUNTER — Other Ambulatory Visit: Payer: Self-pay | Admitting: Family Medicine

## 2024-12-06 DIAGNOSIS — G629 Polyneuropathy, unspecified: Secondary | ICD-10-CM

## 2024-12-19 ENCOUNTER — Ambulatory Visit: Payer: MEDICAID | Attending: Family

## 2024-12-19 ENCOUNTER — Ambulatory Visit (HOSPITAL_BASED_OUTPATIENT_CLINIC_OR_DEPARTMENT_OTHER)
Admission: RE | Admit: 2024-12-19 | Discharge: 2024-12-19 | Disposition: A | Discharge status: Home / Self Care | Payer: MEDICAID | Source: Ambulatory Visit | Attending: Family | Admitting: Family

## 2024-12-19 ENCOUNTER — Other Ambulatory Visit: Payer: Self-pay

## 2024-12-19 DIAGNOSIS — J449 Chronic obstructive pulmonary disease, unspecified: Secondary | ICD-10-CM

## 2024-12-19 DIAGNOSIS — D494 Neoplasm of unspecified behavior of bladder: Secondary | ICD-10-CM | POA: Insufficient documentation

## 2024-12-19 DIAGNOSIS — I509 Heart failure, unspecified: Secondary | ICD-10-CM | POA: Insufficient documentation

## 2024-12-19 DIAGNOSIS — Z01818 Encounter for other preprocedural examination: Secondary | ICD-10-CM

## 2024-12-19 LAB — COMPREHENSIVE METABOLIC PANEL, NON-FASTING
ALBUMIN/GLOBULIN RATIO: 1.1 — ABNORMAL LOW (ref 1.5–2.5)
ALBUMIN: 3.3 g/dL — ABNORMAL LOW (ref 3.5–5.0)
ALKALINE PHOSPHATASE: 88 U/L (ref 38–126)
ALT (SGPT): 21 U/L (ref <50)
AST (SGOT): 34 U/L (ref 17–59)
BILIRUBIN TOTAL: 0.6 mg/dL (ref 0.2–1.3)
BUN/CREA RATIO: 9 (ref 6–20)
BUN: 12 mg/dL (ref 9–20)
CALCIUM: 8.9 mg/dL (ref 8.4–10.2)
CHLORIDE: 110 mmol/L — ABNORMAL HIGH (ref 98–107)
CO2 TOTAL: 24 mmol/L (ref 22–30)
CREATININE: 1.3 mg/dL — ABNORMAL HIGH (ref 0.66–1.20)
ESTIMATED GFR: >60 mL/min/1.73mˆ2 (ref >60)
GLUCOSE: 107 mg/dL — ABNORMAL HIGH (ref 74–106)
POTASSIUM: 3.5 mmol/L (ref 3.5–5.1)
PROTEIN TOTAL: 6.2 g/dL — ABNORMAL LOW (ref 6.3–8.2)
SODIUM: 142 mmol/L (ref 137–145)

## 2024-12-19 LAB — CBC
HCT: 33.9 % — ABNORMAL LOW (ref 36.0–46.0)
HGB: 11.6 g/dL — ABNORMAL LOW (ref 13.9–16.3)
MCH: 30.4 pg (ref 25.4–34.0)
MCHC: 34.2 g/dL (ref 30.0–37.0)
MCV: 88.9 fL (ref 80.0–100.0)
MPV: 8.6 fL (ref 7.5–11.5)
PLATELETS: 48 x10ˆ3/uL — ABNORMAL LOW (ref 130–400)
RBC: 3.81 x10ˆ6/uL — ABNORMAL LOW (ref 4.30–5.90)
RDW: 15.8 % — ABNORMAL HIGH (ref 11.5–14.0)
WBC: 2.9 x10ˆ3/uL — ABNORMAL LOW (ref 4.5–11.5)

## 2024-12-21 NOTE — Pharmacy (Signed)
 [place]  [facility] / Department of Pharmaceutical Services  Aminoglycoside Therapeutic Drug Monitoring: Gentamicin   12/21/2024      Hector [name]  Date of Birth:  23-Apr-1961    Ideal BW: 70.7 kg  Actual BW:    83.2 kg    Date RPh Current regimen (including mg/kg) Indication Target Levels (mcg/mL) SCr (mg/dL) CrCl* (mL/min) Measured level (mcg/mL) Plan (including when levels are due) Comments   1/8 MVS 360 mg Gent @5mg /kg (IBW=70.7 kg) Surg prophylaxis     Pre-op 1x dose                                                                              *Creatinine clearance is estimated by using the Cockcroft-Gault equation for adult patients and the Arlana graft for pediatric patients.    The Medical Executive Committee at [facility] has granted pharmacists via protocol order the ability to place or discontinue vancomycin or aminoglycoside level orders as they deem clinically appropriate.  The Pharmacy will continue to follow Jerel [name] Bellucci's drug therapy with the primary team.  Please contact the Pharmacy with any questions.

## 2024-12-22 ENCOUNTER — Ambulatory Visit (HOSPITAL_COMMUNITY): Payer: MEDICAID

## 2024-12-22 ENCOUNTER — Ambulatory Visit
Admission: RE | Admit: 2024-12-22 | Discharge: 2024-12-22 | Disposition: A | Discharge status: Home / Self Care | Payer: MEDICAID | Source: Ambulatory Visit | Attending: Student in an Organized Health Care Education/Training Program | Admitting: Student in an Organized Health Care Education/Training Program

## 2024-12-22 ENCOUNTER — Other Ambulatory Visit: Payer: Self-pay

## 2024-12-22 ENCOUNTER — Encounter (HOSPITAL_COMMUNITY): Payer: Self-pay | Admitting: Student in an Organized Health Care Education/Training Program

## 2024-12-22 ENCOUNTER — Encounter (HOSPITAL_COMMUNITY)
Admission: RE | Disposition: A | Discharge status: Home / Self Care | Payer: Self-pay | Source: Ambulatory Visit | Attending: Student in an Organized Health Care Education/Training Program

## 2024-12-22 DIAGNOSIS — I272 Pulmonary hypertension, unspecified: Secondary | ICD-10-CM | POA: Insufficient documentation

## 2024-12-22 DIAGNOSIS — F17210 Nicotine dependence, cigarettes, uncomplicated: Secondary | ICD-10-CM | POA: Insufficient documentation

## 2024-12-22 DIAGNOSIS — C670 Malignant neoplasm of trigone of bladder: Secondary | ICD-10-CM | POA: Insufficient documentation

## 2024-12-22 DIAGNOSIS — E785 Hyperlipidemia, unspecified: Secondary | ICD-10-CM

## 2024-12-22 DIAGNOSIS — I2510 Atherosclerotic heart disease of native coronary artery without angina pectoris: Secondary | ICD-10-CM | POA: Insufficient documentation

## 2024-12-22 DIAGNOSIS — I10 Essential (primary) hypertension: Secondary | ICD-10-CM

## 2024-12-22 DIAGNOSIS — Z95828 Presence of other vascular implants and grafts: Secondary | ICD-10-CM

## 2024-12-22 DIAGNOSIS — J449 Chronic obstructive pulmonary disease, unspecified: Secondary | ICD-10-CM | POA: Insufficient documentation

## 2024-12-22 DIAGNOSIS — Z7902 Long term (current) use of antithrombotics/antiplatelets: Secondary | ICD-10-CM | POA: Insufficient documentation

## 2024-12-22 MED ORDER — ACETAMINOPHEN 1,000 MG/100 ML (10 MG/ML) INTRAVENOUS SOLUTION
Freq: Once | INTRAVENOUS | Status: DC | PRN
  Administered 2024-12-22: 1000 mg via INTRAVENOUS

## 2024-12-22 MED ORDER — DEXAMETHASONE SODIUM PHOSPHATE 4 MG/ML INJECTION SOLUTION
Freq: Once | INTRAMUSCULAR | Status: DC | PRN
  Administered 2024-12-22: 4 mg via INTRAVENOUS

## 2024-12-22 MED ORDER — FENTANYL (PF) 50 MCG/ML INJECTION WRAPPER
INJECTION | Freq: Once | INTRAMUSCULAR | Status: DC | PRN
  Administered 2024-12-22: 100 ug via INTRAVENOUS

## 2024-12-22 MED ORDER — ONDANSETRON HCL (PF) 4 MG/2 ML INJECTION SOLUTION
Freq: Once | INTRAMUSCULAR | Status: DC | PRN
  Administered 2024-12-22: 4 mg via INTRAVENOUS

## 2024-12-22 MED ORDER — HYDROMORPHONE (PF) 0.5 MG/0.5 ML INJECTION SYRINGE: 0.4000 mg | INJECTION | INTRAMUSCULAR | Status: DC | PRN

## 2024-12-22 MED ORDER — SUGAMMADEX 100 MG/ML INTRAVENOUS SOLUTION
Freq: Once | INTRAVENOUS | Status: DC | PRN
  Administered 2024-12-22: 400 mg via INTRAVENOUS

## 2024-12-22 MED ORDER — SUGAMMADEX 100 MG/ML INTRAVENOUS SOLUTION
INTRAVENOUS | Status: AC
  Filled 2024-12-22: qty 2

## 2024-12-22 MED ORDER — LACTATED RINGERS INTRAVENOUS SOLUTION: INTRAVENOUS | Status: DC

## 2024-12-22 MED ORDER — MIDAZOLAM 1 MG/ML INJECTION WRAPPER
INTRAMUSCULAR | Status: AC
  Filled 2024-12-22: qty 2

## 2024-12-22 MED ORDER — SODIUM CHLORIDE 0.9% FLUSH BAG - 250 ML: INTRAVENOUS | Status: DC | PRN

## 2024-12-22 MED ORDER — SODIUM CHLORIDE 0.9 % (FLUSH) INJECTION SYRINGE: 2.0000 mL | INJECTION | Freq: Three times a day (TID) | INTRAMUSCULAR | Status: DC

## 2024-12-22 MED ORDER — LIDOCAINE (PF) 20 MG/ML (2 %) INJECTION SOLUTION
Freq: Once | INTRAMUSCULAR | Status: DC | PRN
  Administered 2024-12-22: 3 mL via ENDOTRACHEOPULMONARY

## 2024-12-22 MED ORDER — ROCURONIUM 10 MG/ML INTRAVENOUS SYRINGE WRAPPER
INJECTION | Freq: Once | INTRAVENOUS | Status: DC | PRN
  Administered 2024-12-22: 50 mg via INTRAVENOUS

## 2024-12-22 MED ORDER — MIDAZOLAM 1 MG/ML INJECTION WRAPPER
Freq: Once | INTRAMUSCULAR | Status: DC | PRN
  Administered 2024-12-22 (×2): 1 mg via INTRAVENOUS

## 2024-12-22 MED ORDER — PHENAZOPYRIDINE 100 MG TABLET
100.0000 mg | ORAL_TABLET | Freq: Three times a day (TID) | ORAL | 0 refills | Status: AC | PRN
  Filled 2024-12-22: qty 10, 4d supply, fill #0

## 2024-12-22 MED ORDER — SODIUM CHLORIDE 0.9 % (FLUSH) INJECTION SYRINGE: 2.0000 mL | INJECTION | INTRAMUSCULAR | Status: DC | PRN

## 2024-12-22 MED ORDER — SODIUM CHLORIDE 0.9 % INTRAVENOUS SOLUTION
360.0000 mg | Freq: Once | INTRAVENOUS | Status: AC
  Administered 2024-12-22: 360 mg via INTRAVENOUS
  Filled 2024-12-22: qty 9

## 2024-12-22 MED ORDER — HYDROMORPHONE (PF) 0.5 MG/0.5 ML INJECTION SYRINGE: 0.2000 mg | INJECTION | INTRAMUSCULAR | Status: DC | PRN

## 2024-12-22 MED ORDER — LIDOCAINE (PF) 20 MG/ML (2 %) INJECTION SOLUTION
INTRAMUSCULAR | Status: AC
  Filled 2024-12-22: qty 5

## 2024-12-22 MED ORDER — PROPOFOL 10 MG/ML IV BOLUS
INJECTION | Freq: Once | INTRAVENOUS | Status: DC | PRN
  Administered 2024-12-22: 150 mg via INTRAVENOUS

## 2024-12-22 MED ORDER — LIDOCAINE (PF) 20 MG/ML (2 %) INJECTION SOLUTION
Freq: Once | INTRAMUSCULAR | Status: DC | PRN
  Administered 2024-12-22: 80 mg via INTRAVENOUS

## 2024-12-22 MED ORDER — FENTANYL (PF) 50 MCG/ML INJECTION SOLUTION
INTRAMUSCULAR | Status: AC
  Filled 2024-12-22: qty 2

## 2024-12-22 MED ORDER — SODIUM CHLORIDE 0.9 % INTRAVENOUS SOLUTION
INTRAVENOUS | Status: DC | PRN
  Administered 2024-12-22: 0 via INTRAVENOUS

## 2024-12-22 MED ORDER — ONDANSETRON HCL (PF) 4 MG/2 ML INJECTION SOLUTION: 4.0000 mg | Freq: Once | INTRAMUSCULAR | Status: DC | PRN

## 2024-12-22 MED ORDER — DEXTROSE 5% IN WATER (D5W) FLUSH BAG - 250 ML: INTRAVENOUS | Status: DC | PRN

## 2024-12-22 MED ORDER — HYOSCYAMINE 0.125 MG SUBLINGUAL TABLET
0.1250 mg | SUBLINGUAL_TABLET | Freq: Once | SUBLINGUAL | Status: DC | PRN
  Filled 2024-12-22: qty 1

## 2024-12-22 NOTE — Discharge Summary (Signed)
 [place]  [facility]  Department of Urology  Discharge Day Note    Hector [name]  Date of service: 12/22/2024  Date of Admission: 12/22/2024  Hospital Day:  LOS: 0 days     Examination at discharge: stable  Vital Signs: Temperature: 36.9 C (98.4 F) (12/22/24 1126)  Heart Rate: 60 (12/22/24 1130)  BP (Non-Invasive): (!) 158/62 (12/22/24 1130)  Respiratory Rate: 15 (12/22/24 1130)  SpO2: 97 % (12/22/24 1130)  General: No distress    Assessment:   64 y.o. male s/p TURBT    Disposition/Plan:   - Home discharge    - Antibiotics were given preoperatively  - Follow up in 2 weeks with telephone visit for review of pathology      Jayson Mccreedy, MD  PGY-3, Department of Urology      I saw and examined the patient.  I reviewed the resident's note.   I agree with the findings and plan of care as documented in the resident's note.   Any exceptions/additions are edited/noted.    Toribio PARAS. Trellis, MD  Assistant Professor  [facility] Department of Urology  Pager [phone]  12/22/2024, 11:50

## 2024-12-22 NOTE — Anesthesia Postprocedure Evaluation (Signed)
 Anesthesia Post Op Evaluation    Patient: Hector [name]  Procedure(s):  RESECTION TUMOR BLADDER TRANSURETHRAL    Last Vitals:Temperature: 36.9 C (98.4 F) (12/22/24 1126)  Heart Rate: 60 (12/22/24 1126)  BP (Non-Invasive): (!) 143/64 (12/22/24 1126)  Respiratory Rate: 16 (12/22/24 1126)  SpO2: 97 % (12/22/24 1126)    No notable events documented.    Patient is sufficiently recovered from the effects of anesthesia to participate in the evaluation and has returned to their pre-procedure level.  Patient location during evaluation: PACU       Patient participation: complete - patient participated  Level of consciousness: awake and alert and responsive to verbal stimuli    Pain management: adequate  Airway patency: patent    Anesthetic complications: no  Cardiovascular status: acceptable  Respiratory status: acceptable  Hydration status: acceptable  Patient post-procedure temperature: Pt Normothermic   PONV Status: Absent

## 2024-12-22 NOTE — H&P (Signed)
 [place]  [facility]  Department of Urology   Pre-operative History and Physical    Hector [name]  [id number]  11-Nov-1961    CC:  1. High-risk high-volume non-muscle invasive bladder cancer  12/2023 - Gross hematuria  12/2023 - Admit  hospital  01/2024 - CT IVP (bladder mass)  02/2024 - TURBT [name]  04/20/2024 - Re-Staging TURBT [name]) pT1HG   Induction BCG 6/6 treatments  07/2024 - Surveillance cystoscopy with concern for recurrence   08/2024 - TURBT [name]) without evidence of malignancy  11/2024 - Surveillance cystoscopy with concern for recurrence   2. Past medical history significant for HTN, HLD, COPD, CAD, Pulmonary HTN,   3. Past surgical history significant for Carotid stents   4. Blood thinners: Plavix   5. ECOG: 2 - Up >50% of waking hours, ambulatory, capable of selfcare, unable to carry out any work activities    HPI: Hector [name] is a 64 y.o. male with a history of bladder cancer who presents for TURBT. The patient was last seen in clinic on 11/21/24 for surveillance cystoscopy. No new issues.    PMHx:   Past Medical History:   Diagnosis Date    Alcohol abuse     Bladder cancer     Cancer (CMS [facility])     Carotid stenosis     right side stent    Chronic obstructive airway disease     Cirrhosis     Closed fracture of transverse process of thoracic vertebra 05/01/2021    Coma     after fall due to alcholism May 2022    Delirium, withdrawal, alcoholic (CMS HCC)     Depression     Encephalopathy 05/15/2021    Esophageal reflux     H/O urinary tract infection     8/18 per pt    Heart murmur     HTN (hypertension)     Hyperlipidemia     Ileus (CMS HCC) 05/18/2021    Left pulmonary contusion 05/01/2021    Leukopenia 05/18/2021    Multiple rib fractures 05/01/2021    Oxygen  dependent     Pleural effusion 05/11/2021    Serum ammonia increased (CMS HCC) 05/15/2021    Shortness of breath     Spleen injury 05/01/2021    Status post thoracentesis  05/19/2021    Thrombocytopenia (CMS HCC) 05/01/2021    Unknown cause of injury     fx nose     PSHx:   Past Surgical History:   Procedure Laterality Date    CAROTID STENT      COLONOSCOPY  08/2021    ESOPHAGOGASTRODUODENOSCOPY  08/2021    HX HEART CATHETERIZATION      HX OTHER Right 06/29/2023    TCAR    HX TONSILLECTOMY      OTHER SURGICAL HISTORY      HX of plastic surgery to right head     Family Hx: SABRA  Family Medical History:       Problem Relation (Age of Onset)    Asthma Father    Cerebral Aneurysm Sister    Dementia Mother    Heart Attack Paternal Grandmother (44)          Social Hx:   Social History     Socioeconomic History    Marital status: Single     Spouse name: vickie    Number of children: 2    Years of education: 12    Highest education  level: Not on file   Occupational History    Not on file   Tobacco Use    Smoking status: Every Day     Current packs/day: 1.00     Average packs/day: 1 pack/day for 52.0 years (52.0 ttl pk-yrs)     Types: Cigarettes     Start date: 41    Smokeless tobacco: Never    Tobacco comments:     1800 quit now   Vaping Use    Vaping status: Never Used   Substance and Sexual Activity    Alcohol use: Not Currently     Alcohol/week: 15.0 standard drinks of alcohol     Types: 15 Cans of beer per week     Comment:  may 17th, 2022     Drug use: Yes     Types: Marijuana     Comment:  a joint or 2 a day     Sexual activity: Yes   Other Topics Concern    Ability to Walk 1 Flight of Steps without SOB/CP No     Comment: SOB    Routine Exercise No    Ability to Walk 2 Flight of Steps without SOB/CP No     Comment: SOB & CP    Unable to Ambulate Not Asked    Total Care Not Asked    Ability To Do Own ADL's Yes    Uses Walker No    Other Activity Level Yes    Uses Cane No   Social History Narrative    Not on file     Social Determinants of Health     Financial Resource Strain: Low Risk (05/02/2024)    Financial Resource Strain     SDOH Financial: No   Transportation Needs: Low Risk  (05/02/2024)    Transportation Needs     SDOH Transportation: No   Social Connections: Low Risk (02/26/2024)    Social Connections     SDOH Social Isolation: 5 or more times a week   Intimate Partner Violence: Low Risk (05/02/2024)    Intimate Partner Violence     SDOH Domestic Violence: No   Housing Stability: Low Risk (05/02/2024)    Housing Stability     SDOH Housing Situation: I have housing.     SDOH Housing Worry: No     Medications:   No current outpatient medications on file.     Allergies: Allergies[1]    ROS:  All 10 systems were reviewed.  There were pertinent positives in: Genital urinary system  There are no new changes to the prior reviewed review of systems.    Physical Exam:  BP (!) 139/58   Pulse 57   Temp 36.6 C (97.9 F)   Resp 18   Ht 1.727 m (5' 8)   Wt 80.1 kg (176 lb 9.4 oz)   SpO2 90%   BMI 26.85 kg/m     General - Alert/oriented; NAD  Neck - Supple, trachea midline  Lungs - Respirations are unlabored, good inspiratory effort  Abdomen - Non-distended   Musculoskeletal - 4 extremities intact  Neurological - CN II-XII Grossly intact  GU system - Deferred. Plan to examine in OR    Assessment:  64 y.o. male with:     1. High-risk high-volume non-muscle invasive bladder cancer  12/2023 - Gross hematuria  12/2023 - Admit [place] [facility]  01/2024 - CT IVP (bladder mass)  02/2024 - TURBT [name]  04/20/2024 -  Re-Staging TURBT [name]) pT1HG   Induction BCG 6/6 treatments  07/2024 - Surveillance cystoscopy with concern for recurrence   08/2024 - TURBT [name]) without evidence of malignancy  11/2024 - Surveillance cystoscopy with concern for recurrence   2. Past medical history significant for HTN, HLD, COPD, CAD, Pulmonary HTN,   3. Past surgical history significant for Carotid stents   4. Blood thinners: Plavix   5. ECOG: 2 - Up >50% of waking hours, ambulatory, capable of selfcare, unable to carry out any work activities    Plan:  To OR for  TURBT  Consent obtained  The patient understands the risks of the procedure, which include bleeding, infection, recurrence, damage to the surrounding structures, failure to correct pathology, loss of sensation, and the risks of anesthesia (heart attack, stroke, death, etc.)    All questions of patient and family answered      Jayson Mccreedy, MD  PGY-3, Department of Urology      I saw and examined the patient.  I reviewed the resident's note.   I agree with the findings and plan of care as documented in the resident's note.   Any exceptions/additions are edited/noted.    Toribio PARAS. Trellis, MD  Assistant Professor  [facility] Department of Urology  Pager [phone]  12/22/2024, 11:50             [1]   Allergies  Allergen Reactions    Bee Venom Protein (Honey Bee)      Bee stings

## 2024-12-22 NOTE — Anesthesia Preprocedure Evaluation (Signed)
 ANESTHESIA PRE-OP EVALUATION  Planned Procedure: RESECTION TUMOR BLADDER TRANSURETHRAL  Review of Systems                   Pulmonary     Cardiovascular    No peripheral edema,        GI/Hepatic/Renal           Endo/Other         Neuro/Psych/MS        Cancer                        Physical Assessment      Airway       Mallampati: II    TM distance: >3 FB    Neck ROM: full  Mouth Opening: good.  No Facial hair  No Beard  No endotracheal tube present  No Tracheostomy present    Dental           (+) poor dentition, missing             Pulmonary    Breath sounds clear to auscultation  (-) no rhonchi, no decreased breath sounds, no wheezes, no rales and no stridor     Cardiovascular        (-) no friction rub, carotid bruit is not present and no peripheral edema     Other findings              Plan  ASA 3     Planned anesthesia type: general           PONV Plan:  I plan to administer pharmcologic prophalaxis antiemetics  POV PLAN:   plan for postoperative opioid use            Intravenous induction     Anesthesia issues/risks discussed are: Dental Injuries, PONV, Cardiac Events/MI, Intraoperative Awareness/ Recall, Blood Loss, Sore Throat and Post-op Pain Management.  Anesthetic plan and risks discussed with patient  signed consent obtained      Use of blood products discussed with patient who consented to blood products.      Patient's NPO status is appropriate for Anesthesia.           Plan discussed with CRNA.

## 2024-12-22 NOTE — OR Surgeon (Signed)
 [place]  [facility]   DEPARTMENT OF UROLOGY   OPERATION SUMMARY     PATIENT NAME: Hector [name] NUMBER: [id number]   DATE OF SERVICE: 12/22/2024   DATE OF BIRTH: [birth year]     PREOPERATIVE DIAGNOSIS:  1. 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 Past medical history significant for HTN, HLD, COPD, CAD, Pulmonary HTN,   3. Past surgical history significant for Carotid stents   4. Blood thinners: Plavix   5. ECOG: 2 - Up >50% of waking hours, ambulatory, capable of selfcare, unable to carry out any work activities    POSTOPERATIVE DIAGNOSIS: Same, with a small 1 cm papillary mass just lateral to right ureteral orifice    NAME OF PROCEDURE:    1. Rigid cystourethroscopy  2. Bipolar transurethral resection of bladder tumor (TURBT) - Small (< 2 cm)  3. Fulguration of resection bed    FINDINGS:  1. There was one tumor measuring approximately 1 cm just lateral to the right ureteral orifice. Tumor was noted to be papillary. There was no concern for CIS. There was extensive scar noted across the trigone and include the ureteral orifices.  2. All visible tumor was resected and fulgurated completely, and there was muscle in specimen. There was no bladder perforation visualized. Ureteral orifices were not involved in the resection.    Right trigone bladder tumor and resection bed       Area of resection scar over trigone      SURGEON(S): Toribio Penning, MD  RESIDENT(S): Jayson Mccreedy, MD    ESTIMATED BLOOD LOSS: Minimal  COMPLICATIONS: None  ANESTHESIA: General  endotracheal  SPECIMENS: Bladder tumor x1  TUBES: None  DRAINS: None    INDICATIONS FOR PROCEDURE: 64 y.o. male with a history of bladder cancer and concern for recurrence on surveillance cystoscopy who presents today for TURBT.    DESCRIPTION OF PROCEDURE: The patient was consented preoperatively. All risks and benefits of the procedure were explained which include, damage to the ureter, urethra, bladder which could require future and/or emergent surgery to fix, ureteral or urethral stricture, bleeding, infection, bladder perforation requiring prolonged catheterization and/or surgery, failure to resect all of the tumor, and need for another surgery. Complications from anesthesia which include heart attack, stroke, blood clot, pulmonary embolus, prolonged mechanical ventilation, and rare instances death. There are also risks of nerve damage from patient positioning. The patient was then brought back to the operative suite and placed on the operating table. General anesthesia was induced. The patient was placed in dorsal lithotomy position with pressure points well padded. Genitalia were prepped and draped in usual sterile fashion. A surgical timeout was performed identifying the correct patient, date of birth, medical record number, surgical site, allergies, and surgical procedure.     Using a regular length 27 Fr bipolar resectoscope with a 12-degree lens, the scope was inserted through the urethra and into the bladder under direct visualization. Once inside the bladder, panendoscopic views of the bladder were obtained including anterior lateral walls, floor, trigone, and dome. There was a papillary tumor a the  right trigone. Using the medium loop with the bipolar resectoscope, the bladder tumor was resected until muscle fibers were present. Spot coagulation was used for hemostasis. At this point the resectoscope was removed. The bladder was emptied, and the procedure was terminated. The patient tolerated the  procedure well with no complications. Dr. Toribio Penning was present and participated in the entire procedure.     DISPOSITION: The patient will be discharged home. He will follow up in 2 weeks to review pathology.      Jayson Mccreedy, MD  PGY-3, Department of Urology      I was present and participated in all aspects of the patient's case.   I reviewed the resident's note.   I agree with the findings and plan of care as documented in the resident's note.   Any exceptions/additions are edited/noted.    Toribio PARAS. Penning, MD  Assistant Professor  [facility] Department of Urology  Pager [phone]  12/22/2024, 13:30

## 2024-12-22 NOTE — Discharge Instructions (Signed)
SURGICAL DISCHARGE INSTRUCTIONS     Dr. Aviva Signs, MD  performed your RESECTION TUMOR BLADDER TRANSURETHRAL today at the [facility]    [facility]:  Monday through Friday from 6 a.m. - 7 p.m.: [phone]  Between 7 p.m. - 6 a.m., weekends and holidays:  Call [facility] at [phone] or [phone].    PLEASE SEE WRITTEN HANDOUTS AS DISCUSSED BY YOUR NURSE:      SIGNS AND SYMPTOMS OF A WOUND / INCISION INFECTION   Be sure to watch for the following:   Increase in redness or red streaks near or around the wound or incision.   Increase in pain that is intense or severe and cannot be relieved by the pain medication that your doctor has given you.   Increase in swelling that cannot be relieved by elevation of a body part, or by applying ice, if permitted.   Increase in drainage, or if yellow / green in color and smells bad. This could be on a dressing or a cast.   Increase in fever for longer than 24 hours, or an increase that is higher than 101 degrees Fahrenheit (normal body temperature is 98 degrees Fahrenheit). The incision may feel warm to the touch.    **CALL YOUR DOCTOR IF ONE OR MORE OF THESE SIGNS / SYMPTOMS SHOULD OCCUR.    ANESTHESIA INFORMATION   ANESTHESIA -- ADULT PATIENTS:  You have received intravenous sedation / general anesthesia, and you may feel drowsy and light-headed for several hours. You may even experience some forgetfulness of the procedure. DO NOT DRIVE A MOTOR VEHICLE or perform any activity requiring complete alertness or coordination until you feel fully awake in about 24-48 hours. Do not drink alcoholic beverages for at least 24 hours. Do not stay alone, you must have a responsible adult available to be with you. You may also experience a dry mouth or nausea for 24 hours. This is a normal side effect and will disappear as the effects of the medication wear off.    REMEMBER   If you experience any difficulty breathing, chest pain, bleeding  that you feel is excessive, persistent nausea or vomiting or for any other concerns:  Call your physician Dr. Mellissa Kohut* at [phone] or [phone]. You may also ask to have the UROLOGY doctor on call paged. They are available to you 24 hours a day.    SPECIAL INSTRUCTIONS / COMMENTS       FOLLOW-UP APPOINTMENTS   Please call patient services at [phone] or [phone] to schedule a date / time of return. They are open Monday - Friday from 7:30 am - 5:00 pm.

## 2024-12-22 NOTE — Anesthesia Transfer of Care (Signed)
 ANESTHESIA TRANSFER OF CARE   Hector [name] is a 64 y.o. ,male, Weight: 80.1 kg (176 lb 9.4 oz)   had Procedure(s):  RESECTION TUMOR BLADDER TRANSURETHRAL  performed  12/22/2024   Primary Service: Toribio Penning, *    Past Medical History:   Diagnosis Date    Alcohol abuse     Bladder cancer     Cancer (CMS [facility])     Carotid stenosis     right side stent    Chronic obstructive airway disease     Cirrhosis     Closed fracture of transverse process of thoracic vertebra 05/01/2021    Coma     after fall due to alcholism May 2022    Delirium, withdrawal, alcoholic (CMS HCC)     Depression     Encephalopathy 05/15/2021    Esophageal reflux     H/O urinary tract infection     8/18 per pt    Heart murmur     HTN (hypertension)     Hyperlipidemia     Ileus (CMS HCC) 05/18/2021    Left pulmonary contusion 05/01/2021    Leukopenia 05/18/2021    Multiple rib fractures 05/01/2021    Oxygen  dependent     Pleural effusion 05/11/2021    Serum ammonia increased (CMS HCC) 05/15/2021    Shortness of breath     Spleen injury 05/01/2021    Status post thoracentesis 05/19/2021    Thrombocytopenia (CMS HCC) 05/01/2021    Unknown cause of injury     fx nose      Allergy History as of 12/22/24       BEE VENOM PROTEIN (HONEY BEE)         Noted Status Severity Type Reaction    05/01/21 1124 Mason Hollering, LPN 94/81/77 Active       Comments: Bee stings                   I completed my transfer of care / handoff to the receiving personnel during which we discussed:  Access, Airway, All key/critical aspects of case discussed, Analgesia, Expectation of post procedure, Fluids/Product, Gave opportunity for questions and acknowledgement of understanding, PMHx and Antibiotics      Post Location: PACU                        Additional Info:Patient transported to PACU on 6L [facility]. VSS. Respirations regular and unlabored. Report given to PACU RN. Questions answered.                                       Last OR Temp: Temperature: 36.9 C (98.4  F)      Airway:* No LDAs found *  Blood pressure (!) 143/64, pulse 60, temperature 36.9 C (98.4 F), resp. rate 16, height 1.727 m (5' 8), weight 80.1 kg (176 lb 9.4 oz), SpO2 97%.

## 2024-12-26 LAB — SURGICAL PATHOLOGY SPECIMEN

## 2025-01-02 ENCOUNTER — Other Ambulatory Visit: Payer: Self-pay

## 2025-01-02 ENCOUNTER — Encounter (INDEPENDENT_AMBULATORY_CARE_PROVIDER_SITE_OTHER): Payer: Self-pay | Admitting: Student in an Organized Health Care Education/Training Program

## 2025-01-02 ENCOUNTER — Ambulatory Visit
Payer: MEDICAID | Attending: Student in an Organized Health Care Education/Training Program | Admitting: Student in an Organized Health Care Education/Training Program

## 2025-01-02 ENCOUNTER — Ambulatory Visit (INDEPENDENT_AMBULATORY_CARE_PROVIDER_SITE_OTHER): Payer: Self-pay | Admitting: Student in an Organized Health Care Education/Training Program

## 2025-01-02 ENCOUNTER — Other Ambulatory Visit (INDEPENDENT_AMBULATORY_CARE_PROVIDER_SITE_OTHER): Payer: Self-pay | Admitting: Student in an Organized Health Care Education/Training Program

## 2025-01-02 DIAGNOSIS — C679 Malignant neoplasm of bladder, unspecified: Secondary | ICD-10-CM

## 2025-01-02 NOTE — Telephone Encounter (Signed)
 Message from Arland HERO sent at 01/02/2025  1:02 PM EST    Trellis,        Sister is requesting a return call, has questions to discuss.       Call History    Contact Date/Time Type Contact Phone/Fax By   01/02/2025 01:00 PM EST Phone (Incoming) [place] (Emergency Contact) [phone] Jason Arland       Dr. Trellis contacted Gaynell Ned via telephone.    Edsel Gores, RN

## 2025-01-02 NOTE — Progress Notes (Signed)
 UROLOGY, PHYSICIAN OFFICE CENTER  [address]  [place] [address]  Operated by [facility]  Telephone Visit    Name:  Hector [name] MRN: [id number]   Date:  01/02/2025 DOB: 1961-11-02 (64 y.o.)          The patient/family initiated a request for telephone service.  Verbal consent for this service was obtained from the patient/family.  Reason for audio only:Patient preference    Last office visit in this department: Visit date not found      Reason for call: review pathology    Call notes:  Pathology showed recurrent high grade bladder cancer. I discussed referral to urologic oncology to discuss cystectomy and he declined. He also states he doesn't think he is interested in another induction course of BCG. I will set him up for cystoscopy in 3 months. I strongly encouraged him to call and let us  know if he is open to cystectomy discussion or repeat induction BCG.    No diagnosis found.    LOS Determination: Time-based- Total encounter time including direct communication, chart review and documentation was 12 minutes    Toribio Penning, MD

## 2025-01-04 ENCOUNTER — Encounter (INDEPENDENT_AMBULATORY_CARE_PROVIDER_SITE_OTHER): Payer: Self-pay

## 2025-01-04 ENCOUNTER — Ambulatory Visit (HOSPITAL_COMMUNITY): Payer: Self-pay

## 2025-01-05 ENCOUNTER — Ambulatory Visit (HOSPITAL_COMMUNITY): Payer: MEDICAID

## 2025-01-12 ENCOUNTER — Other Ambulatory Visit (INDEPENDENT_AMBULATORY_CARE_PROVIDER_SITE_OTHER): Payer: Self-pay

## 2025-01-12 DIAGNOSIS — R609 Edema, unspecified: Secondary | ICD-10-CM

## 2025-01-25 ENCOUNTER — Ambulatory Visit (HOSPITAL_BASED_OUTPATIENT_CLINIC_OR_DEPARTMENT_OTHER): Payer: Self-pay | Admitting: Urology

## 2025-01-31 ENCOUNTER — Ambulatory Visit (INDEPENDENT_AMBULATORY_CARE_PROVIDER_SITE_OTHER): Payer: Self-pay

## 2025-02-03 ENCOUNTER — Ambulatory Visit (INDEPENDENT_AMBULATORY_CARE_PROVIDER_SITE_OTHER): Payer: Self-pay

## 2025-04-03 ENCOUNTER — Ambulatory Visit (INDEPENDENT_AMBULATORY_CARE_PROVIDER_SITE_OTHER): Payer: Self-pay | Admitting: Student in an Organized Health Care Education/Training Program

## 2025-05-09 ENCOUNTER — Encounter (INDEPENDENT_AMBULATORY_CARE_PROVIDER_SITE_OTHER): Payer: Self-pay | Admitting: Nurse Practitioner
